# Patient Record
Sex: Female | Born: 1940 | Race: White | Hispanic: No | Smoking: Never smoker
Health system: Southern US, Community
[De-identification: ages and names within clinical notes are randomized; demographics above are authoritative.]

## PROBLEM LIST (undated history)

## (undated) DIAGNOSIS — I472 Ventricular tachycardia, unspecified: Secondary | ICD-10-CM

## (undated) DIAGNOSIS — I493 Ventricular premature depolarization: Secondary | ICD-10-CM

## (undated) DIAGNOSIS — T8859XA Other complications of anesthesia, initial encounter: Secondary | ICD-10-CM

## (undated) DIAGNOSIS — Z9889 Other specified postprocedural states: Secondary | ICD-10-CM

## (undated) DIAGNOSIS — I509 Heart failure, unspecified: Secondary | ICD-10-CM

## (undated) DIAGNOSIS — C439 Malignant melanoma of skin, unspecified: Secondary | ICD-10-CM

## (undated) DIAGNOSIS — T4145XA Adverse effect of unspecified anesthetic, initial encounter: Secondary | ICD-10-CM

## (undated) DIAGNOSIS — I10 Essential (primary) hypertension: Secondary | ICD-10-CM

## (undated) DIAGNOSIS — E079 Disorder of thyroid, unspecified: Secondary | ICD-10-CM

## (undated) DIAGNOSIS — I5023 Acute on chronic systolic (congestive) heart failure: Secondary | ICD-10-CM

## (undated) DIAGNOSIS — R112 Nausea with vomiting, unspecified: Secondary | ICD-10-CM

## (undated) DIAGNOSIS — I469 Cardiac arrest, cause unspecified: Secondary | ICD-10-CM

## (undated) DIAGNOSIS — Z87440 Personal history of urinary (tract) infections: Secondary | ICD-10-CM

## (undated) DIAGNOSIS — C9210 Chronic myeloid leukemia, BCR/ABL-positive, not having achieved remission: Secondary | ICD-10-CM

## (undated) HISTORY — PX: ABDOMINAL HYSTERECTOMY: SHX81

## (undated) HISTORY — PX: TONSILLECTOMY: SUR1361

## (undated) HISTORY — PX: TONSILLECTOMY: SHX5217

## (undated) HISTORY — DX: Heart failure, unspecified: I50.9

## (undated) HISTORY — DX: Essential (primary) hypertension: I10

## (undated) HISTORY — PX: TUBAL LIGATION: SHX77

## (undated) HISTORY — PX: APPENDECTOMY: SHX54

---

## 2004-07-08 ENCOUNTER — Encounter: Admission: RE | Admit: 2004-07-08 | Discharge: 2004-07-08 | Payer: Self-pay | Admitting: Internal Medicine

## 2004-08-20 ENCOUNTER — Observation Stay (HOSPITAL_COMMUNITY): Admission: AD | Admit: 2004-08-20 | Discharge: 2004-08-22 | Payer: Self-pay | Admitting: *Deleted

## 2004-08-20 ENCOUNTER — Encounter (INDEPENDENT_AMBULATORY_CARE_PROVIDER_SITE_OTHER): Payer: Self-pay | Admitting: Specialist

## 2005-09-03 ENCOUNTER — Inpatient Hospital Stay (HOSPITAL_COMMUNITY): Admission: EM | Admit: 2005-09-03 | Discharge: 2005-09-07 | Payer: Self-pay | Admitting: Surgery

## 2005-09-15 ENCOUNTER — Encounter: Admission: RE | Admit: 2005-09-15 | Discharge: 2005-10-09 | Payer: Self-pay

## 2006-10-14 ENCOUNTER — Encounter: Admission: RE | Admit: 2006-10-14 | Discharge: 2006-10-14 | Payer: Self-pay | Admitting: Family Medicine

## 2007-03-17 ENCOUNTER — Encounter: Admission: RE | Admit: 2007-03-17 | Discharge: 2007-03-17 | Payer: Self-pay | Admitting: Orthopaedic Surgery

## 2010-12-26 NOTE — Op Note (Signed)
NAMEAMARE, Lisa [name]            ACCOUNT NO.:  [id number]   MEDICAL RECORD NO.:  [id number]          PATIENT TYPE:  OBV   LOCATION:  0098                         FACILITY:  [facility]   PHYSICIAN:  Vikki Ports, MDDATE OF BIRTH:  06-28-1941   DATE OF PROCEDURE:  08/20/2004  DATE OF DISCHARGE:                                 OPERATIVE REPORT   PREOPERATIVE DIAGNOSIS:  Acute appendicitis.   POSTOPERATIVE DIAGNOSIS:  Acute appendicitis.   PROCEDURE:  Laparoscopic appendectomy.   SURGEON:  Vikki Ports, MD   ANESTHESIA:  General.   COMPLICATIONS:  None.   DRAINS:  None.   SPECIMENS:  Appendix.   DESCRIPTION OF PROCEDURE:  The patient was taken to the operating room and  placed in the supine position.  After adequate general anesthesia was  induced using endotracheal tube, the abdomen was prepped and draped in  normal sterile fashion.  Using a 12 mm Opti View in the left upper quadrant,  peritoneal penetration was obtained and pneumoperitoneum was obtained.  Under direct visualization, additional 12 mm port was placed in the left  lower quadrant and 5 mm port was placed in the left mid abdomen.  Appendix,  which was retrocecal in position, was mobilized.  The mesoappendix was taken  down with the harmonic scalpel, until the base of the appendix was  skeletonized.  I transected this across the cecum because of the thinness of  the base of the appendix.  The right lower quadrant was then copiously  irrigated.  The appendix was placed in an Endo catch bag and removed.  Pneumoperitoneum was released and skin was closed with subcuticular 4-0  Monocryl.  Injected with Marcaine.   The patient tolerated the procedure well and went to PACU in good condition.      KRH/MEDQ  D:  08/21/2004  T:  08/21/2004  Job:  [id number]

## 2010-12-26 NOTE — Discharge Summary (Signed)
NAMECAMBREY, LUPI NO.:  [id number]   MEDICAL RECORD NO.:  [id number]          PATIENT TYPE:  INP   LOCATION:  6703                         FACILITY:  [facility]   PHYSICIAN:  Lebron Conners, M.D.   DATE OF BIRTH:  [birth year]   DATE OF ADMISSION:  09/03/2005  DATE OF DISCHARGE:  09/07/2005                                 DISCHARGE SUMMARY   CHIEF COMPLAINT AND REASON FOR ADMISSION:  Lisa [name] is a 70 year old female  patient who developed cellulitis in her right lower extremity about eight  weeks prior to presentation.  On admission she was placed on Keflex and  apparently the leg improved.  She was instructed if her symptoms worsened to  present to the urgent care.  Twenty-four hours prior to admission she  developed pain in the right leg and then around 6 p.m. that same evening  developed chills and nausea and went to [facility] and was seen by  Dr. Cleta Alberts there.  Dr. Cleta Alberts contacted Dr. Ezzard Standing directly and requested that  Ms. Axe be admitted to the surgical service because of significant  cellulitis and swelling in the right lower extremity.   The patient was evaluated in the ER by Dr. Ezzard Standing.  She had low-grade  temperature of 99.3, pulse 117, BP 150/84.  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  The right leg demonstrated a superficial cellulitis, quite  erythematous laterally.  At this point the patient was deemed in need of  admission to the hospital for IV antibiotics for treatment of significant  right lower extremity cellulitis in the setting of chronic lower extremity  edema.   HOSPITAL COURSE:  The patient was admitted to the hospital by Dr. Ezzard Standing to  the surgical floor.  She was started on IV Tygacil.  Her initial white count  was 20,700, hemoglobin 11.2.  Because of the chronic lower extremity edema,  lower extremity Dopplers were ordered.  The patient told me during the  hospitalization that she has had lymphedema in her legs all her  life, and  the lymphedema has increased significantly over the past 10 years since she  has become somewhat obese.  Her pain was moderately controlled and due to  the degree of inflammatory symptoms, Toradol IV was added.   Over the following days the patient's cellulitis and edema improved.  The  Doppler study showed no DVT.  On September 07, 2005, the patient was switched  over to Keflex orally.  She is allergic to PENICILLIN and FLAGYL.  CBC was  checked and white count was down to 10,100.  The office was also notified of  need to set the patient up for any available lymphedema clinics.  This was  ordered per Dr. Orson Slick.  By September 07, 2005, she was markedly better.  She  was tolerating the Keflex and she was deemed appropriate for discharge home  by Dr. Orson Slick.   FINAL DISCHARGE DIAGNOSES:  1.  Right lower extremity cellulitis, resolved.  2.  Leukocytosis, resolved.  3.  Chronic bilateral lower extremity lymphedema.  4.  Normal lower extremity Doppler  study in regard to deep vein thrombosis.   DISCHARGE MEDICATIONS:  1.  Armor Thyroid 30 mg daily.  2.  Benicar/hydrochlorothiazide 20/12.5 mg daily.  3.  Keflex 250 mg q.i.d. for five days.   DIET:  Heart-healthy.   ACTIVITY:  Increase activity slowly.   WOUND CARE:  Elevated affected extremity, i.e., the right leg, when seated.   ADDITIONAL INSTRUCTIONS:  Referral to lymphedema clinic.  This will be done  through the [facility] office.  Follow up with Dr. Orson Slick if  needed.  She has the number to call.      Allison L. Rennis Harding, N.P.      Lebron Conners, M.D.  Electronically Signed    ALE/MEDQ  D:  10/28/2005  T:  10/29/2005  Job:  [id number]

## 2010-12-26 NOTE — H&P (Signed)
NAMEJANAYAH, ZAVADA            ACCOUNT NO.:  [id number]   MEDICAL RECORD NO.:  [id number]          PATIENT TYPE:  INP   LOCATION:  6703                         FACILITY:  [facility]   PHYSICIAN:  Sandria Bales. Ezzard Standing, M.D.  DATE OF BIRTH:  Mar 09, 1941   DATE OF ADMISSION:  09/03/2005  DATE OF DISCHARGE:                                HISTORY & PHYSICAL   HISTORY OF PRESENT ILLNESS:  This is a 70 year old white female who  identifies that Dr. Monico Hoar of [place] is her primary medical  physician. Apparently, she was a patient of Dr. Allena Napoleon up until  just eight or twelve weeks ago, but Dr. Alessandra Bevels does not take the insurance  that she has so she switched over to Dr. Monico Hoar.   Approximately eight weeks ago, she developed what she describes a cellulitis  in her leg. She was placed on Keflex. The leg got better; however, Dr.  Corine Shelter, her medical doctor, apparently told her not to go to the hospital.  He says if she got worse to go to Urgent Care.   Apparently some time in the last 24 hours she developed some pain in her  right leg. This evening at about 6 p.m. developed chills and nausea and went  to [facility] and was seen by Dr. Cleta Alberts.   I was operating in the operating room at the time but Dr. Cleta Alberts paged me and  had said that Dr. Kendrick Ranch had requested that we admit Ms. Earlene Plater', his  exwife, to the surgical service at [facility].   PAST MEDICAL HISTORY:  She has allergies to PENICILLIN which led to welts in  1982. She says she is allergic to DEMEROL. She is allergic to DARVON which  causes chest discomfort and has an allergy to FLAGYL.   CURRENT MEDICATIONS:  Amour thyroid extract and Benicar 20/12 1 tablet  daily.   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 She  had a tonsillectomy as a child. She had a hysterectomy in 1990 for a benign  disease and then in January of 2006 by Dr. Danna Hefty did a  laparoscopic appendectomy for acute appendicitis.   REVIEW OF SYSTEMS:  NEUROLOGICAL:  No history of seizure or loss of  consciousness.  PULMONARY:  No history of pneumonia or tuberculosis.  CARDIAC:  She has had hypertension for a number of years which sounds like  it has been stable. She has had no chest pain or angina.  GASTROINTESTINAL:  No history of peptic ulcer disease or liver disease. She  had a prior laparoscopic appendectomy.  MUSCULOSKELETAL:  She has chronic lymphedema of both lower extremities. She  says she has been that way for years. She has no real diagnosis. She does  have a defect of her right anterior on her right shin where she had a  melanoma excised in 1975.   Her daughter is in the room with her again. She is working part-time. I  think she has some insurance coverage and this is why she switched to Dr.  Corine Shelter.  PHYSICAL EXAMINATION:  VITAL SIGNS:  Temperature is 99.3, pulse 117,  respirations 20, blood pressure 150/84.  GENERAL:  She is a well-nourished obese white female, alert and cooperative  on physical exam.  HEENT:  Unremarkable.  NECK:  Supple. I felt no mass or thyromegaly.  LUNGS:  Clear to auscultation.  HEART:  Tachycardic. I hear no murmur.  ABDOMEN:  Soft. She has no tenderness, no guarding, no mass.  EXTREMITIES:  Both lower extremities has about 3+ edema which appears  chronic. Her right leg laterally along her calf is hot and red consistent  with a superficial cellulitis. She has a defect from a prior melanoma on the  medial aspect of her right anterior chin. I see no evidence of external  trauma.   I have no labs at this time. We will obtain some.   DIAGNOSES:  1.  Right leg superficial cellulitis in the face of chronic lower extremity      edema:  Plan is IV antibiotics, elevation of her leg and Doppler's of      her lower extremities in the morning. Hopefully, this can be controlled      in 24-48 hours so she can be sent home on  oral antibiotics.  2.  Hypertension.  3.  Thyroid replacement.  4.  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  5.  History of laparoscopic appendectomy for which she has done well.  6.  Obesity.      Sandria Bales. Ezzard Standing, M.D.  Electronically Signed     DHN/MEDQ  D:  09/03/2005  T:  09/04/2005  Job:  [id number]   cc:   Monico Hoar, M.D.  [employer] Cleta Alberts, M.D.  Fax: [phone]

## 2012-02-26 ENCOUNTER — Ambulatory Visit (INDEPENDENT_AMBULATORY_CARE_PROVIDER_SITE_OTHER): Payer: Medicare Other | Admitting: Family Medicine

## 2012-02-26 ENCOUNTER — Telehealth: Payer: Self-pay

## 2012-02-26 VITALS — BP 130/80 | HR 94 | Temp 98.0°F | Resp 16 | Ht 62.0 in | Wt 257.0 lb

## 2012-02-26 DIAGNOSIS — R062 Wheezing: Secondary | ICD-10-CM

## 2012-02-26 DIAGNOSIS — R05 Cough: Secondary | ICD-10-CM

## 2012-02-26 MED ORDER — ALBUTEROL SULFATE (2.5 MG/3ML) 0.083% IN NEBU
2.5000 mg | INHALATION_SOLUTION | Freq: Once | RESPIRATORY_TRACT | Status: AC
  Administered 2012-02-26: 2.5 mg via RESPIRATORY_TRACT

## 2012-02-26 MED ORDER — IPRATROPIUM BROMIDE 0.03 % NA SOLN: 2.0000 | Freq: Four times a day (QID) | NASAL | Status: DC

## 2012-02-26 MED ORDER — ALBUTEROL SULFATE HFA 108 (90 BASE) MCG/ACT IN AERS: 2.0000 | INHALATION_SPRAY | Freq: Four times a day (QID) | RESPIRATORY_TRACT | Status: DC | PRN

## 2012-02-26 MED ORDER — AZITHROMYCIN 250 MG PO TABS: ORAL_TABLET | ORAL | Status: AC

## 2012-02-26 NOTE — Progress Notes (Signed)
[facility] [address] [place] [address] [phone]  Date:  02/26/2012   Name:  Lisa [name]   DOB:  21-May-1941   MRN:  [id number]  PCP:  No primary provider on file.    Chief Complaint: Cough   History of Present Illness:  Sulamita Lafountain is a 71 y.o. very pleasant female patient who presents with the following:  She has noted a cough for the past 6 weeks.  Seemed to start with ?allergies and clear PND.  She had been coughing up clear mucus.  Her symptoms have actually gotten better. However, she was concerned about the duration of her symtoms and she sometimes feels SOB when she has a coughing attack.  She is worse with lying down or sleeping and has even noted some wheezing at night.  She does not typically have wheezing.     She has not noted any fever.  She does not have asthma or RAD. She has tried an OTC cough syrup, throat drops.    She takes a thyroid replacement medication and vitamins.   She has never been a smoker  There is no problem list on file for this patient.   No past medical history on file.  No past surgical history on file.  History  Substance Use Topics  . Smoking status: Never Smoker   . Smokeless tobacco: Not on file  . Alcohol Use: Not on file    No family history on file.  Allergies  Allergen Reactions  . Flagyl (Metronidazole)   . Penicillins     Medication list has been reviewed and updated.  No current outpatient prescriptions on file prior to visit.    Review of Systems:  As per HPI- otherwise negative.  Physical Examination: Filed Vitals:   02/26/12 1155  BP: 130/80  Pulse: 94  Temp: 98 F (36.7 C)  Resp: 16   Filed Vitals:   02/26/12 1155  Height: 5\' 2"  (1.575 m)  Weight: 257 lb (116.574 kg)   Body mass index is 47.01 kg/(m^2). Ideal Body Weight: Weight in (lb) to have BMI = 25: 136.4   GEN: WDWN, NAD, Non-toxic, A & O x 3, obese HEENT: Atraumatic, Normocephalic. Neck supple. No  masses, No LAD. Ears and Nose: No external deformity. CV: RRR, No M/G/R. No JVD. No thrill. No extra heart sounds. PULM: CTA B, no crackles, rhonchi. No retractions. No resp. distress. No accessory muscle use. Slightly decreased air movement bilaterally, but no wheeze ABD: S, NT, ND, +BS. No rebound. No HSM. EXTR: No c/c/e NEURO Normal gait.  PSYCH: Normally interactive. Conversant. Not depressed or anxious appearing.  Calm demeanor.   Treated with albuterol neg treatment- lungs more clear, better air flow, O2 sat improved to 97%  Assessment and Plan: 1. Cough  azithromycin (ZITHROMAX) 250 MG tablet, ipratropium (ATROVENT) 0.03 % nasal spray, albuterol (PROVENTIL HFA;VENTOLIN HFA) 108 (90 BASE) MCG/ACT inhaler, albuterol (PROVENTIL) (2.5 MG/3ML) 0.083% nebulizer solution 2.5 mg  2. Wheezing     Treat for bronchitis and PND/ RAD as above.   If not better in the next few days please give [place] a call- Sooner if worse.     Abbe Amsterdam, MD

## 2012-02-26 NOTE — Telephone Encounter (Signed)
The patient called to report that [facility] did not have the nasal spray in stock and would like [place] to send the Rx for that medication to the [facility] on [address].  Please call the patient once this has been done at [phone].

## 2012-02-27 NOTE — Telephone Encounter (Signed)
Spoke with patient, and she had already gotten medication transferred.

## 2015-02-14 ENCOUNTER — Other Ambulatory Visit: Payer: Self-pay | Admitting: Family Medicine

## 2015-02-14 ENCOUNTER — Ambulatory Visit (INDEPENDENT_AMBULATORY_CARE_PROVIDER_SITE_OTHER): Payer: Medicare Other | Admitting: Family Medicine

## 2015-02-14 ENCOUNTER — Ambulatory Visit (INDEPENDENT_AMBULATORY_CARE_PROVIDER_SITE_OTHER): Payer: Medicare Other

## 2015-02-14 VITALS — BP 132/82 | HR 97 | Temp 101.4°F | Resp 17 | Ht 62.5 in | Wt 254.0 lb

## 2015-02-14 DIAGNOSIS — I890 Lymphedema, not elsewhere classified: Secondary | ICD-10-CM

## 2015-02-14 DIAGNOSIS — R1084 Generalized abdominal pain: Secondary | ICD-10-CM

## 2015-02-14 DIAGNOSIS — R42 Dizziness and giddiness: Secondary | ICD-10-CM | POA: Diagnosis not present

## 2015-02-14 DIAGNOSIS — R112 Nausea with vomiting, unspecified: Secondary | ICD-10-CM

## 2015-02-14 DIAGNOSIS — E038 Other specified hypothyroidism: Secondary | ICD-10-CM | POA: Diagnosis not present

## 2015-02-14 DIAGNOSIS — R509 Fever, unspecified: Secondary | ICD-10-CM | POA: Diagnosis not present

## 2015-02-14 DIAGNOSIS — E860 Dehydration: Secondary | ICD-10-CM

## 2015-02-14 DIAGNOSIS — R109 Unspecified abdominal pain: Secondary | ICD-10-CM

## 2015-02-14 DIAGNOSIS — R197 Diarrhea, unspecified: Secondary | ICD-10-CM | POA: Diagnosis not present

## 2015-02-14 DIAGNOSIS — J189 Pneumonia, unspecified organism: Secondary | ICD-10-CM

## 2015-02-14 DIAGNOSIS — Z8679 Personal history of other diseases of the circulatory system: Secondary | ICD-10-CM

## 2015-02-14 LAB — COMPLETE METABOLIC PANEL WITH GFR
ALT: 14 U/L (ref 0–35)
AST: 17 U/L (ref 0–37)
Albumin: 4.2 g/dL (ref 3.5–5.2)
Alkaline Phosphatase: 109 U/L (ref 39–117)
BUN: 16 mg/dL (ref 6–23)
CO2: 20 mEq/L (ref 19–32)
Calcium: 9.3 mg/dL (ref 8.4–10.5)
Chloride: 99 mEq/L (ref 96–112)
Creat: 1.17 mg/dL — ABNORMAL HIGH (ref 0.50–1.10)
GFR, Est African American: 53 mL/min — ABNORMAL LOW
GFR, Est Non African American: 46 mL/min — ABNORMAL LOW
Glucose, Bld: 130 mg/dL — ABNORMAL HIGH (ref 70–99)
Potassium: 4.8 mEq/L (ref 3.5–5.3)
Sodium: 133 mEq/L — ABNORMAL LOW (ref 135–145)
Total Bilirubin: 1 mg/dL (ref 0.2–1.2)
Total Protein: 7.4 g/dL (ref 6.0–8.3)

## 2015-02-14 LAB — POCT URINALYSIS DIPSTICK
Bilirubin, UA: NEGATIVE
Glucose, UA: NEGATIVE
Ketones, UA: NEGATIVE
Leukocytes, UA: NEGATIVE
Nitrite, UA: NEGATIVE
Protein, UA: NEGATIVE
Spec Grav, UA: 1.02
Urobilinogen, UA: 0.2
pH, UA: 5

## 2015-02-14 LAB — CBC

## 2015-02-14 LAB — POCT UA - MICROSCOPIC ONLY
Casts, Ur, LPF, POC: NEGATIVE
Crystals, Ur, HPF, POC: NEGATIVE
Mucus, UA: NEGATIVE
Yeast, UA: NEGATIVE

## 2015-02-14 LAB — TSH: TSH: 0.257 u[IU]/mL — ABNORMAL LOW (ref 0.350–4.500)

## 2015-02-14 MED ORDER — AZITHROMYCIN 250 MG PO TABS: ORAL_TABLET | ORAL | Status: DC

## 2015-02-14 MED ORDER — ACETAMINOPHEN 325 MG PO TABS
500.0000 mg | ORAL_TABLET | Freq: Once | ORAL | Status: AC
  Administered 2015-02-14: 487.5 mg via ORAL

## 2015-02-14 MED ORDER — ONDANSETRON 4 MG PO TBDP: 4.0000 mg | ORAL_TABLET | Freq: Three times a day (TID) | ORAL | Status: DC | PRN

## 2015-02-14 MED ORDER — ONDANSETRON 4 MG PO TBDP
4.0000 mg | ORAL_TABLET | Freq: Once | ORAL | Status: AC
  Administered 2015-02-14: 4 mg via ORAL

## 2015-02-14 NOTE — Patient Instructions (Signed)

## 2015-02-14 NOTE — Progress Notes (Signed)
Chief Complaint:  Chief Complaint  Patient presents with  . Nausea  . Abdominal Pain  . Dizziness    HPI: Lisa [name] is a 74 y.o. female who is here for  Chills, nonbloody diarrhea, soft stools, dizziness, she has nausea and vomiting and dry heaves. She is not having flatus.  She denies any abdominal surgeires, She has had a hysterectomy, she has had  appendectomy, she ahs had a tubal ligation. She still has her gallbaldder.  She deneis SBO but has had in the past some constipation.  No recent travels or abx , she ha shad no appetite, she is so thirsty.. She has had distension of her stomach.  She has had some SOB but no CP, she has a hx of SOB due to CHF.  Her shortness of breath has been unchanged. She denies having any weight gain. She is very dizzy.  It doesn't matter she's moving or sitting. She has not had any by mouth intake. She has hadnonbloody  diarrhea. She has a history of CHF with an EF of 50. She has cellulitis and lymphedema  And one of her legs may be warmer thant he other but not sure.  She has a history of diverticular disease  Wt Readings from Last 3 Encounters:  02/14/15 254 lb (115.214 kg)  02/26/12 257 lb (116.574 kg)    SpO2 Readings from Last 3 Encounters:  02/14/15 97%  02/26/12 97%    Her PCP -Wolfgang Phoenix Vu in [place]  No past medical history on file. No past surgical history on file. History   Social History  . Marital Status: Divorced    Spouse Name: N/A  . Number of Children: N/A  . Years of Education: N/A   Social History Main Topics  . Smoking status: Never Smoker   . Smokeless tobacco: Not on file  . Alcohol Use: Not on file  . Drug Use: Not on file  . Sexual Activity: Not on file   Other Topics Concern  . None   Social History Narrative   No family history on file. Allergies  Allergen Reactions  . Flagyl [Metronidazole]   . Penicillins    Prior to Admission medications   Medication Sig Start Date End Date Taking?  Authorizing Provider  aspirin EC 81 MG tablet Take 81 mg by mouth daily.   Yes Historical Provider, MD  atorvastatin (LIPITOR) 20 MG tablet Take 20 mg by mouth daily.   Yes Historical Provider, MD  carvedilol (COREG) 12.5 MG tablet Take 12.5 mg by mouth 2 (two) times daily with a meal.   Yes Historical Provider, MD  co-enzyme Q-10 30 MG capsule Take 30 mg by mouth 3 (three) times daily.   Yes Historical Provider, MD  furosemide (LASIX) 20 MG tablet Take 20 mg by mouth.   Yes Historical Provider, MD  lisinopril (PRINIVIL,ZESTRIL) 5 MG tablet Take 5 mg by mouth daily.   Yes Historical Provider, MD  thyroid (ARMOUR) 15 MG tablet Take 15 mg by mouth daily.   Yes Historical Provider, MD     ROS: The patient denies  night sweats, unintentional weight loss, chest pain, palpitations, wheezing, dysuria, hematuria, melena, numbness,  or tingling.   All other systems have been reviewed and were otherwise negative with the exception of those mentioned in the HPI and as above.    PHYSICAL EXAM: Filed Vitals:   02/14/15 1241  BP: 132/82  Pulse: 97  Temp: 101.4 F (38.6 C)  Resp: 17   Filed Vitals:   02/14/15 1241  Height: 5' 2.5" (1.588 m)  Weight: 254 lb (115.214 kg)   Body mass index is 45.69 kg/(m^2).   General: Alert, no acute distress, obese, tired appearing HEENT:  Normocephalic, atraumatic, oropharynx patent. EOMI, PERRLA Cardiovascular:  Regular rate and rhythm, no rubs murmurs or gallops.  No Carotid bruits, radial pulse intact. Positive lymphedema  Respiratory: Clear to auscultation bilaterally.  No wheezes, rales, or rhonchi.  No cyanosis, no use of accessory musculature GI: No organomegaly, abdomen is soft and non-tender, positive bowel sounds.  No masses. Skin: No rashes. Neurologic: Facial musculature symmetric. Psychiatric: Patient is appropriate throughout our interaction. Lymphatic: No cervical lymphadenopathy Musculoskeletal: Gait intact.   LABS: Results for orders  placed or performed in visit on 02/14/15  POCT UA - Microscopic Only  Result Value Ref Range   WBC, Ur, HPF, POC 1-3    RBC, urine, microscopic 0-2    Bacteria, U Microscopic few    Mucus, UA neg    Epithelial cells, urine per micros 2-4    Crystals, Ur, HPF, POC neg    Casts, Ur, LPF, POC neg    Yeast, UA neg   POCT urinalysis dipstick  Result Value Ref Range   Color, UA yellow    Clarity, UA clear    Glucose, UA neg    Bilirubin, UA neg    Ketones, UA neg    Spec Grav, UA 1.020    Blood, UA trace-lysed    pH, UA 5.0    Protein, UA neg    Urobilinogen, UA 0.2    Nitrite, UA neg    Leukocytes, UA Negative Negative     EKG/XRAY:   Primary read interpreted by [facility]. Marin Comment at [facility]. ? Right sided PNA/infiltrate Moderate stool, no obvious obstruction or free air   ASSESSMENT/PLAN: Encounter Diagnoses  Name Primary?  . Diffuse abdominal pain Yes  . Dizziness and giddiness   . Dehydration   . Lymphedema   . Diarrhea   . Non-intractable vomiting with nausea, vomiting of unspecified type   . Other specified fever   . CAP (community acquired pneumonia)    74 year old female with a PMH of Hypothyroid, CHF with EF of 50% , and chronic lymphedema who present for fatigue , dehydration, nonbloody diarrhea and chest xray did not show fluid overload but di have possible infiltrate.  She has been taking all her meds and has not been hydrating. She felt better after IVFs and zofran  Azithromycin, MiraLAX for constipation and when able collect stool softener she is to stay on unti plan iqid diet if shefors sh nees to retun for follow-up tomorrw Discussed options of IM Rocephin but she has a penicillin allergy, however she has taken Keflex before without any problems but we will stay with the plan of putting her on zpack, if sxs worsen may need to take rocephin There is a potential that may be her left leg is having early signs of cellulitis but it's very similar to her right leg. She has  chronic lymphedema. Sxs may be diverticular disease Tylenol and/or motrin prn for fever Fu in 1-2 days  Gross sideeffects, risk and benefits, and alternatives of medications d/w patient. Patient is aware that all medications have potential sideeffects and we are unable to predict every sideeffect or drug-drug interaction that may occur.  Dr. Tilden Fossa Marin Comment 02/14/2015 4:03 PM  02/15/15-called patient. Dw her labs, she is feelingbetter. Needs to  stay off thyroid meds for 1 week and recheck with Dr Gale Journey since overcorrected.  Lab Results  Component Value Date   TSH 0.257* 02/14/2015   IMPRESSION: Streaky right basilar atelectasis or infiltrate. Osteopenia and mild degenerative changes thoracic spine.   Electronically Signed  By: Lahoma Crocker M.D.  On: 02/14/2015 14:10

## 2015-02-17 ENCOUNTER — Ambulatory Visit (INDEPENDENT_AMBULATORY_CARE_PROVIDER_SITE_OTHER): Payer: Medicare Other | Admitting: Emergency Medicine

## 2015-02-17 VITALS — BP 140/82 | HR 90 | Temp 98.4°F | Resp 17 | Wt 260.0 lb

## 2015-02-17 DIAGNOSIS — E039 Hypothyroidism, unspecified: Secondary | ICD-10-CM | POA: Diagnosis not present

## 2015-02-17 DIAGNOSIS — J189 Pneumonia, unspecified organism: Secondary | ICD-10-CM | POA: Diagnosis not present

## 2015-02-17 DIAGNOSIS — R109 Unspecified abdominal pain: Secondary | ICD-10-CM | POA: Diagnosis not present

## 2015-02-17 DIAGNOSIS — R1084 Generalized abdominal pain: Secondary | ICD-10-CM

## 2015-02-17 NOTE — Progress Notes (Signed)
Subjective:  Patient ID: Lisa [name], female    DOB: 05-Jul-1941  Age: 74 y.o. MRN: [id number]  CC: Follow-up; Abdominal Pain; and fever blisters   HPI Lisa [name] presents  with a history of a visit for pneumonia and abdominal pain secondary to constipation. She's used her laxative is ordered and has been drinking liquids. Her abdominal pain and distention is gone. She has a persistent cough but is much less than was before and is no longer productive. She's no fever chills nausea vomiting. She has no shortness breath or wheezing. She has no fever or chills.  History Lisa [name] has a past medical history of Hypertension and CHF (congestive heart failure).   She has past surgical history that includes Tubal ligation; Abdominal hysterectomy; Appendectomy; and Tonsillectomy.   Her  family history is not on file.  She   reports that she has never smoked. She does not have any smokeless tobacco history on file. Her alcohol and drug histories are not on file.  Outpatient Prescriptions Prior to Visit  Medication Sig Dispense Refill  . aspirin EC 81 MG tablet Take 81 mg by mouth daily.    [name] atorvastatin (LIPITOR) 20 MG tablet Take 20 mg by mouth daily.    [name] azithromycin (ZITHROMAX) 250 MG tablet Take 2 tabs po now then 1 tab po daily for the next 4 days 6 each 0  . carvedilol (COREG) 12.5 MG tablet Take 12.5 mg by mouth 2 (two) times daily with a meal.    . co-enzyme Q-10 30 MG capsule Take 30 mg by mouth 3 (three) times daily.    [name] lisinopril (PRINIVIL,ZESTRIL) 5 MG tablet Take 5 mg by mouth daily.    . furosemide (LASIX) 20 MG tablet Take 20 mg by mouth.    . ondansetron (ZOFRAN ODT) 4 MG disintegrating tablet Take 1 tablet (4 mg total) by mouth every 8 (eight) hours as needed for nausea or vomiting. (Patient not taking: Reported on 02/17/2015) 20 tablet 0  . thyroid (ARMOUR) 15 MG tablet Take 15 mg by mouth daily.     No facility-administered medications prior to visit.     History   Social History  . Marital Status: Divorced    Spouse Name: N/A  . Number of Children: N/A  . Years of Education: N/A   Social History Main Topics  . Smoking status: Never Smoker   . Smokeless tobacco: Not on file  . Alcohol Use: Not on file  . Drug Use: Not on file  . Sexual Activity: Not on file   Other Topics Concern  . None   Social History Narrative     Review of Systems  Constitutional: Negative for fever, chills and appetite change.  HENT: Negative for congestion, ear pain, postnasal drip, sinus pressure and sore throat.   Eyes: Negative for pain and redness.  Respiratory: Negative for cough, shortness of breath and wheezing.   Cardiovascular: Negative for leg swelling.  Gastrointestinal: Negative for nausea, vomiting, abdominal pain, diarrhea, constipation and blood in stool.  Endocrine: Negative for polyuria.  Genitourinary: Negative for dysuria, urgency, frequency and flank pain.  Musculoskeletal: Negative for gait problem.  Skin: Negative for rash.  Neurological: Negative for weakness and headaches.  Psychiatric/Behavioral: Negative for confusion and decreased concentration. The patient is not nervous/anxious.     Objective:  BP 140/82 mmHg  Pulse 90  Temp(Src) 98.4 F (36.9 C) (Oral)  Resp 17  Wt 260 lb (117.935 kg)  SpO2 95%  LMP 02/14/1995  Physical Exam  Constitutional: She is oriented to person, place, and time. She appears well-developed and well-nourished.  HENT:  Head: Normocephalic and atraumatic.  Eyes: Conjunctivae are normal. Pupils are equal, round, and reactive to light.  Pulmonary/Chest: Effort normal.  Musculoskeletal: She exhibits no edema.  Neurological: She is alert and oriented to person, place, and time.  Skin: Skin is dry.  Psychiatric: She has a normal mood and affect. Her behavior is normal. Thought content normal.      Assessment & Plan:   Lisa [name] was seen today for follow-up, abdominal pain and fever  blisters.  Diagnoses and all orders for this visit:  CAP (community acquired pneumonia)  Diffuse abdominal pain  Hypothyroidism, unspecified hypothyroidism type   I am having [name] maintain her carvedilol, aspirin EC, co-enzyme Q-10, lisinopril, furosemide, thyroid, atorvastatin, azithromycin, and ondansetron.  No orders of the defined types were placed in this encounter.    Appropriate red flag conditions were discussed with the patient as well as actions that should be taken.  Patient expressed his understanding.  Follow-up: Return in about 1 month (around 03/20/2015).  Roselee Culver, MD

## 2015-02-17 NOTE — Patient Instructions (Signed)
Take 30 one day and 45 next.  Follow up for repeat TSH in one month

## 2015-02-21 DIAGNOSIS — E038 Other specified hypothyroidism: Secondary | ICD-10-CM | POA: Insufficient documentation

## 2015-02-21 DIAGNOSIS — I890 Lymphedema, not elsewhere classified: Secondary | ICD-10-CM | POA: Insufficient documentation

## 2015-02-21 DIAGNOSIS — E039 Hypothyroidism, unspecified: Secondary | ICD-10-CM | POA: Insufficient documentation

## 2015-08-11 DIAGNOSIS — I2510 Atherosclerotic heart disease of native coronary artery without angina pectoris: Secondary | ICD-10-CM

## 2015-08-11 HISTORY — DX: Atherosclerotic heart disease of native coronary artery without angina pectoris: I25.10

## 2015-09-09 ENCOUNTER — Inpatient Hospital Stay (HOSPITAL_COMMUNITY)
Admission: EM | Admit: 2015-09-09 | Discharge: 2015-09-11 | DRG: 872 | Disposition: A | Discharge status: Home / Self Care | Payer: Medicare Other | Attending: Internal Medicine | Admitting: Internal Medicine

## 2015-09-09 ENCOUNTER — Encounter (HOSPITAL_COMMUNITY): Payer: Self-pay | Admitting: Emergency Medicine

## 2015-09-09 ENCOUNTER — Emergency Department (HOSPITAL_COMMUNITY): Payer: Medicare Other

## 2015-09-09 DIAGNOSIS — Z880 Allergy status to penicillin: Secondary | ICD-10-CM

## 2015-09-09 DIAGNOSIS — L0390 Cellulitis, unspecified: Secondary | ICD-10-CM | POA: Diagnosis present

## 2015-09-09 DIAGNOSIS — A419 Sepsis, unspecified organism: Principal | ICD-10-CM | POA: Diagnosis present

## 2015-09-09 DIAGNOSIS — N183 Chronic kidney disease, stage 3 (moderate): Secondary | ICD-10-CM | POA: Diagnosis present

## 2015-09-09 DIAGNOSIS — Z881 Allergy status to other antibiotic agents status: Secondary | ICD-10-CM

## 2015-09-09 DIAGNOSIS — Z85820 Personal history of malignant melanoma of skin: Secondary | ICD-10-CM

## 2015-09-09 DIAGNOSIS — Z8249 Family history of ischemic heart disease and other diseases of the circulatory system: Secondary | ICD-10-CM

## 2015-09-09 DIAGNOSIS — Z6841 Body Mass Index (BMI) 40.0 and over, adult: Secondary | ICD-10-CM

## 2015-09-09 DIAGNOSIS — Z7982 Long term (current) use of aspirin: Secondary | ICD-10-CM

## 2015-09-09 DIAGNOSIS — E038 Other specified hypothyroidism: Secondary | ICD-10-CM | POA: Diagnosis not present

## 2015-09-09 DIAGNOSIS — I428 Other cardiomyopathies: Secondary | ICD-10-CM | POA: Diagnosis present

## 2015-09-09 DIAGNOSIS — R509 Fever, unspecified: Secondary | ICD-10-CM | POA: Diagnosis not present

## 2015-09-09 DIAGNOSIS — I447 Left bundle-branch block, unspecified: Secondary | ICD-10-CM | POA: Diagnosis present

## 2015-09-09 DIAGNOSIS — I130 Hypertensive heart and chronic kidney disease with heart failure and stage 1 through stage 4 chronic kidney disease, or unspecified chronic kidney disease: Secondary | ICD-10-CM | POA: Diagnosis present

## 2015-09-09 DIAGNOSIS — L03115 Cellulitis of right lower limb: Secondary | ICD-10-CM | POA: Diagnosis present

## 2015-09-09 DIAGNOSIS — I890 Lymphedema, not elsewhere classified: Secondary | ICD-10-CM | POA: Diagnosis present

## 2015-09-09 DIAGNOSIS — E039 Hypothyroidism, unspecified: Secondary | ICD-10-CM | POA: Diagnosis present

## 2015-09-09 DIAGNOSIS — I5042 Chronic combined systolic (congestive) and diastolic (congestive) heart failure: Secondary | ICD-10-CM | POA: Diagnosis present

## 2015-09-09 DIAGNOSIS — Z8489 Family history of other specified conditions: Secondary | ICD-10-CM

## 2015-09-09 DIAGNOSIS — M7989 Other specified soft tissue disorders: Secondary | ICD-10-CM | POA: Diagnosis present

## 2015-09-09 DIAGNOSIS — I5032 Chronic diastolic (congestive) heart failure: Secondary | ICD-10-CM | POA: Diagnosis not present

## 2015-09-09 DIAGNOSIS — Z807 Family history of other malignant neoplasms of lymphoid, hematopoietic and related tissues: Secondary | ICD-10-CM

## 2015-09-09 DIAGNOSIS — I878 Other specified disorders of veins: Secondary | ICD-10-CM | POA: Diagnosis present

## 2015-09-09 DIAGNOSIS — R112 Nausea with vomiting, unspecified: Secondary | ICD-10-CM | POA: Diagnosis not present

## 2015-09-09 DIAGNOSIS — I110 Hypertensive heart disease with heart failure: Secondary | ICD-10-CM | POA: Diagnosis present

## 2015-09-09 DIAGNOSIS — I10 Essential (primary) hypertension: Secondary | ICD-10-CM | POA: Diagnosis present

## 2015-09-09 DIAGNOSIS — Z79899 Other long term (current) drug therapy: Secondary | ICD-10-CM

## 2015-09-09 DIAGNOSIS — R0989 Other specified symptoms and signs involving the circulatory and respiratory systems: Secondary | ICD-10-CM | POA: Diagnosis present

## 2015-09-09 HISTORY — DX: Malignant melanoma of skin, unspecified: C43.9

## 2015-09-09 HISTORY — DX: Disorder of thyroid, unspecified: E07.9

## 2015-09-09 LAB — COMPREHENSIVE METABOLIC PANEL
ALT: 16 U/L (ref 14–54)
AST: 20 U/L (ref 15–41)
Albumin: 4 g/dL (ref 3.5–5.0)
Alkaline Phosphatase: 81 U/L (ref 38–126)
Anion gap: 10 (ref 5–15)
BUN: 15 mg/dL (ref 6–20)
CO2: 23 mmol/L (ref 22–32)
Calcium: 9.2 mg/dL (ref 8.9–10.3)
Chloride: 105 mmol/L (ref 101–111)
Creatinine, Ser: 1.01 mg/dL — ABNORMAL HIGH (ref 0.44–1.00)
GFR calc Af Amer: >60 mL/min (ref >60)
GFR calc non Af Amer: 54 mL/min — ABNORMAL LOW (ref >60)
Glucose, Bld: 145 mg/dL — ABNORMAL HIGH (ref 65–99)
Potassium: 4.4 mmol/L (ref 3.5–5.1)
Sodium: 138 mmol/L (ref 135–145)
Total Bilirubin: 1.2 mg/dL (ref 0.3–1.2)
Total Protein: 7.6 g/dL (ref 6.5–8.1)

## 2015-09-09 LAB — CBC
HCT: 41.7 % (ref 36.0–46.0)
Hemoglobin: 13.2 g/dL (ref 12.0–15.0)
MCH: 27.8 pg (ref 26.0–34.0)
MCHC: 31.7 g/dL (ref 30.0–36.0)
MCV: 87.8 fL (ref 78.0–100.0)
Platelets: 236 10*3/uL (ref 150–400)
RBC: 4.75 MIL/uL (ref 3.87–5.11)
RDW: 14.6 % (ref 11.5–15.5)
WBC: 28.6 10*3/uL — ABNORMAL HIGH (ref 4.0–10.5)

## 2015-09-09 LAB — URINE MICROSCOPIC-ADD ON

## 2015-09-09 LAB — URINALYSIS, ROUTINE W REFLEX MICROSCOPIC
Bilirubin Urine: NEGATIVE
Glucose, UA: NEGATIVE mg/dL
Ketones, ur: NEGATIVE mg/dL
Leukocytes, UA: NEGATIVE
Nitrite: NEGATIVE
Protein, ur: NEGATIVE mg/dL
Specific Gravity, Urine: 1.022 (ref 1.005–1.030)
pH: 5.5 (ref 5.0–8.0)

## 2015-09-09 LAB — LIPASE, BLOOD: Lipase: 23 U/L (ref 11–51)

## 2015-09-09 LAB — I-STAT CG4 LACTIC ACID, ED: Lactic Acid, Venous: 1.45 mmol/L (ref 0.5–2.0)

## 2015-09-09 MED ORDER — CEFAZOLIN SODIUM-DEXTROSE 2-3 GM-% IV SOLR
2.0000 g | Freq: Three times a day (TID) | INTRAVENOUS | Status: DC
  Administered 2015-09-09 – 2015-09-11 (×5): 2 g via INTRAVENOUS
  Filled 2015-09-09 (×6): qty 50

## 2015-09-09 MED ORDER — ATORVASTATIN CALCIUM 20 MG PO TABS
20.0000 mg | ORAL_TABLET | Freq: Every day | ORAL | Status: DC
  Administered 2015-09-10: 20 mg via ORAL
  Filled 2015-09-09 (×2): qty 1

## 2015-09-09 MED ORDER — FAMOTIDINE 20 MG PO TABS
20.0000 mg | ORAL_TABLET | Freq: Every day | ORAL | Status: DC | PRN
  Filled 2015-09-09: qty 1

## 2015-09-09 MED ORDER — CARVEDILOL 12.5 MG PO TABS
12.5000 mg | ORAL_TABLET | Freq: Two times a day (BID) | ORAL | Status: DC
  Administered 2015-09-10 – 2015-09-11 (×3): 12.5 mg via ORAL
  Filled 2015-09-09 (×3): qty 1

## 2015-09-09 MED ORDER — OSELTAMIVIR PHOSPHATE 75 MG PO CAPS
75.0000 mg | ORAL_CAPSULE | Freq: Two times a day (BID) | ORAL | Status: DC
  Filled 2015-09-09 (×4): qty 1

## 2015-09-09 MED ORDER — ENOXAPARIN SODIUM 60 MG/0.6ML ~~LOC~~ SOLN
60.0000 mg | Freq: Every day | SUBCUTANEOUS | Status: DC
  Filled 2015-09-09 (×2): qty 0.6

## 2015-09-09 MED ORDER — SODIUM CHLORIDE 0.9 % IV BOLUS (SEPSIS)
500.0000 mL | Freq: Once | INTRAVENOUS | Status: AC
  Administered 2015-09-09: 500 mL via INTRAVENOUS

## 2015-09-09 MED ORDER — ACETAMINOPHEN 325 MG PO TABS
650.0000 mg | ORAL_TABLET | Freq: Once | ORAL | Status: AC
  Administered 2015-09-09: 650 mg via ORAL
  Filled 2015-09-09: qty 2

## 2015-09-09 MED ORDER — LISINOPRIL 5 MG PO TABS
5.0000 mg | ORAL_TABLET | Freq: Every day | ORAL | Status: DC
  Administered 2015-09-10 – 2015-09-11 (×2): 5 mg via ORAL
  Filled 2015-09-09 (×2): qty 1

## 2015-09-09 MED ORDER — ACETAMINOPHEN 500 MG PO TABS
500.0000 mg | ORAL_TABLET | Freq: Four times a day (QID) | ORAL | Status: DC | PRN
  Administered 2015-09-10: 500 mg via ORAL
  Filled 2015-09-09: qty 1

## 2015-09-09 MED ORDER — ASPIRIN EC 81 MG PO TBEC
81.0000 mg | DELAYED_RELEASE_TABLET | Freq: Every day | ORAL | Status: DC
  Administered 2015-09-10: 81 mg via ORAL
  Filled 2015-09-09 (×2): qty 1

## 2015-09-09 MED ORDER — ACETAMINOPHEN 650 MG RE SUPP: 650.0000 mg | Freq: Four times a day (QID) | RECTAL | Status: DC | PRN

## 2015-09-09 MED ORDER — SODIUM CHLORIDE 0.9% FLUSH
3.0000 mL | Freq: Two times a day (BID) | INTRAVENOUS | Status: DC
  Administered 2015-09-09 – 2015-09-11 (×3): 3 mL via INTRAVENOUS

## 2015-09-09 MED ORDER — ONDANSETRON HCL 4 MG/2ML IJ SOLN
4.0000 mg | Freq: Once | INTRAMUSCULAR | Status: AC
  Administered 2015-09-09: 4 mg via INTRAVENOUS
  Filled 2015-09-09: qty 2

## 2015-09-09 MED ORDER — THYROID 30 MG PO TABS
30.0000 mg | ORAL_TABLET | Freq: Every day | ORAL | Status: DC
  Administered 2015-09-10 – 2015-09-11 (×2): 30 mg via ORAL
  Filled 2015-09-09 (×2): qty 1

## 2015-09-09 MED ORDER — ONDANSETRON HCL 4 MG/2ML IJ SOLN: 4.0000 mg | Freq: Four times a day (QID) | INTRAMUSCULAR | Status: DC | PRN

## 2015-09-09 MED ORDER — ACETAMINOPHEN 325 MG PO TABS: 650.0000 mg | ORAL_TABLET | Freq: Four times a day (QID) | ORAL | Status: DC | PRN

## 2015-09-09 MED ORDER — ENOXAPARIN SODIUM 40 MG/0.4ML ~~LOC~~ SOLN: 40.0000 mg | SUBCUTANEOUS | Status: DC

## 2015-09-09 MED ORDER — ONDANSETRON HCL 4 MG PO TABS: 4.0000 mg | ORAL_TABLET | Freq: Four times a day (QID) | ORAL | Status: DC | PRN

## 2015-09-09 MED ORDER — VANCOMYCIN HCL 10 G IV SOLR
2000.0000 mg | INTRAVENOUS | Status: AC
  Administered 2015-09-09: 2000 mg via INTRAVENOUS
  Filled 2015-09-09: qty 2000

## 2015-09-09 MED ORDER — VANCOMYCIN HCL 10 G IV SOLR: 1250.0000 mg | INTRAVENOUS | Status: DC

## 2015-09-09 NOTE — ED Provider Notes (Signed)
CSN: [id number]     Arrival date & time 09/09/15  1326 History   First MD Initiated Contact with Patient 09/09/15 1757     Chief Complaint  Patient presents with  . Dizziness  . Emesis     (Consider location/radiation/quality/duration/timing/severity/associated sxs/prior Treatment) HPI   74yF with abdominal pain and n/v since this morning. Pain is across lower abdomen. Describes as deep ache and cramps. No appreciable exacerbating or relieving factors. Fever and shaking chills. Reports severe chronic lymphedema with increased pain/redness in R lower leg in last couple days. Denies trauma. NO sick contacts. No diarrhea. No urinary complaints. No CP or respiratory complaints.   Past Medical History  Diagnosis Date  . Hypertension   . CHF (congestive heart failure) ([place])   . Thyroid disease    Past Surgical History  Procedure Laterality Date  . Tubal ligation    . Abdominal hysterectomy    . Appendectomy    . Tonsillectomy     Family History  Problem Relation Age of Onset  . Multiple myeloma Father   . Coronary artery disease Mother    Social History  Substance Use Topics  . Smoking status: Never Smoker   . Smokeless tobacco: None  . Alcohol Use: No   OB History    No data available     Review of Systems  All systems reviewed and negative, other than as noted in HPI.   Allergies  Flagyl and Penicillins  Home Medications   Prior to Admission medications   Medication Sig Start Date End Date Taking? Authorizing Provider  acetaminophen (TYLENOL) 500 MG tablet Take 500-1,000 mg by mouth every 6 (six) hours as needed for moderate pain.   Yes Historical Provider, MD  aspirin EC 81 MG tablet Take 81 mg by mouth daily.   Yes Historical Provider, MD  atorvastatin (LIPITOR) 20 MG tablet Take 20 mg by mouth daily.   Yes Historical Provider, MD  carvedilol (COREG) 12.5 MG tablet Take 12.5 mg by mouth 2 (two) times daily with a meal.   Yes Historical Provider, MD  co-enzyme  Q-10 30 MG capsule Take 30 mg by mouth daily.    Yes Historical Provider, MD  famotidine (PEPCID) 20 MG tablet Take 20 mg by mouth daily as needed for heartburn or indigestion.   Yes Historical Provider, MD  furosemide (LASIX) 20 MG tablet Take 20 mg by mouth daily.    Yes Historical Provider, MD  lisinopril (PRINIVIL,ZESTRIL) 5 MG tablet Take 5 mg by mouth daily.   Yes Historical Provider, MD  thyroid (ARMOUR) 15 MG tablet Take 15 mg by mouth daily.   Yes Historical Provider, MD  azithromycin (ZITHROMAX) 250 MG tablet Take 2 tabs po now then 1 tab po daily for the next 4 days Patient not taking: Reported on 09/09/2015 02/14/15   Thao P Le, DO  ondansetron (ZOFRAN ODT) 4 MG disintegrating tablet Take 1 tablet (4 mg total) by mouth every 8 (eight) hours as needed for nausea or vomiting. Patient not taking: Reported on 02/17/2015 02/14/15   Thao P Le, DO   BP 138/80 mmHg  Pulse 106  Temp(Src) 102.9 F (39.4 C) (Rectal)  Resp 16  Ht '5\' 2"'  (1.575 m)  Wt 225 lb (102.059 kg)  BMI 41.14 kg/m2  SpO2 98%  LMP 02/14/1995 Physical Exam  Constitutional: She appears well-developed and well-nourished. No distress.  Laying in bed. Conversant. Rigors.   HENT:  Head: Normocephalic and atraumatic.  Eyes: Conjunctivae are normal. Right  eye exhibits no discharge. Left eye exhibits no discharge.  Neck: Neck supple.  Cardiovascular: Regular rhythm and normal heart sounds.  Exam reveals no gallop and no friction rub.   No murmur heard. Tachycardia  Pulmonary/Chest: Effort normal and breath sounds normal. No respiratory distress.  Abdominal: Soft. She exhibits no distension. There is no tenderness.  Musculoskeletal: She exhibits no edema or tenderness.  Neurological: She is alert.  Skin: Skin is warm and dry.  Severe lymphedema bilateral lower extremities with chronic appearing skin changes but right lower extremity does appear to have a superimposed cellulitis. Increased warmth, erythema and tenderness to  palpation of the distal right leg going to about the level of the ankle. Does not extend significantly onto foot.  Psychiatric: She has a normal mood and affect. Her behavior is normal. Thought content normal.  Nursing note and vitals reviewed.   ED Course  Procedures (including critical care time) Labs Review Labs Reviewed  COMPREHENSIVE METABOLIC PANEL - Abnormal; Notable for the following:    Glucose, Bld 145 (*)    Creatinine, Ser 1.01 (*)    GFR calc non Af Amer 54 (*)    All other components within normal limits  CBC - Abnormal; Notable for the following:    WBC 28.6 (*)    All other components within normal limits  CULTURE, BLOOD (ROUTINE X 2)  CULTURE, BLOOD (ROUTINE X 2)  LIPASE, BLOOD  URINALYSIS, ROUTINE W REFLEX MICROSCOPIC (NOT AT [facility])  I-STAT CG4 LACTIC ACID, ED    Imaging Review Dg Chest 2 View  09/09/2015  CLINICAL DATA:  Coughing with nausea and vomiting today. Chronic shortness of breath due to lymphedema. EXAM: CHEST  2 VIEW COMPARISON:  02/14/2015 radiographs. FINDINGS: The heart is mildly enlarged. There is increased pulmonary vascularity without overt pulmonary edema, confluent airspace opacity or significant pleural effusion. Paraspinal osteophytes are noted throughout the thoracic spine. There are mild acromioclavicular degenerative changes bilaterally. IMPRESSION: Mild cardiomegaly with pulmonary vascular congestion. No focal airspace disease or overt pulmonary edema. Electronically Signed   By: Richardean Sale M.D.   On: 09/09/2015 19:00   I have personally reviewed and evaluated these images and lab results as part of my medical decision-making.   EKG Interpretation   Date/Time:  Monday September 09 2015 13:54:50 EST Ventricular Rate:  108 PR Interval:  104 QRS Duration: 178 QT Interval:  352 QTC Calculation: 472 R Axis:   -50 Text Interpretation:  Sinus tachycardia Left bundle branch block Confirmed  by Wilson Singer  MD, Evalene Vath ([id number]) on 09/09/2015 6:13:05  PM      MDM   Final diagnoses:  Cellulitis of right leg without foot  Sepsis, due to unspecified organism [facility])    74yF who presented with abdominal pain and n/v. Febrile. Abdominal exam itself is pretty benign. Tachycardic on arrival but improving with IVF.  Does have cellulitis of RLE. Not convinced all her systemic symptoms are from this though. UA pending. Some studies have shown increased OR of gram negative bacteremia in patients with rigors, although this is certainly nonspecific. Blood cultures and lactic acid ordered. Vanc for cellulitis. No hypotension. Additional coverage deferred at this time Admission.     Virgel Manifold, MD 09/11/15 0900

## 2015-09-09 NOTE — ED Notes (Signed)
1 IV start attempt 

## 2015-09-09 NOTE — ED Notes (Signed)
Patient transported to X-ray 

## 2015-09-09 NOTE — ED Notes (Signed)
Pt c/o dizziness, low abdominal pain, vomiting since this morning. Pt has had episodes of this in the past that have resolved on their own. Pt A&Ox4 and ambulatory. Pt denies chest pain, SOB.

## 2015-09-09 NOTE — Progress Notes (Signed)
Pharmacy Antibiotic Note  Lisa [name] is a 75 y.o. female admitted on 09/09/2015 with coughing, nausea and vomiting.  PMHx significant for lymphedema, diastolic heart failure, and HTN.  Per care everywhere notes, [facility] referred pt to wound care center for assistance with lymphadema in Aug '16 but unclear if pt was able to be seen. In [place], CXR negative for airspace disease.   Pharmacy has been consulted for Vancomycin dosing for cellulitis.  No provider notes yet for additional information.   Plan: CrCl ~55 ml/min, updated wt 102kg.  Elevated WBC.  Afebrile.   Vancomycin 2g IV x 1, then 1250mg  IV q24h  VT goal 10-15 mcg/ml for cellulitis  F/u renal fxn, clinical course    Temp (24hrs), Avg:98.4 F (36.9 C), Min:98.3 F (36.8 C), Max:98.5 F (36.9 C)   Recent Labs Lab 09/09/15 1401  WBC 28.6*  CREATININE 1.01*    CrCl cannot be calculated (Unknown ideal weight.).    Allergies  Allergen Reactions  . Flagyl [Metronidazole] Itching  . Penicillins Hives and Itching    Has patient had a PCN reaction causing immediate rash, facial/tongue/throat swelling, SOB or lightheadedness with hypotension: Yes  Has patient had a PCN reaction causing severe rash involving mucus membranes or skin necrosis: Yes Has patient had a PCN reaction that required hospitalization No Has patient had a PCN reaction occurring within the last 10 years: NO If all of the above answers are "NO", then may proceed with Cephalosporin use.     Antimicrobials this admission: 1/30 >> Vancomycin >>  Dose adjustments this admission: N/A  Microbiology results: 1/30 2/2 BCx: Ordered  Thank you for allowing pharmacy to be a part of this patient's care.  Ralene Bathe, PharmD, BCPS 09/09/2015, 7:54 PM  Pager: [phone]

## 2015-09-09 NOTE — H&P (Signed)
History and Physical  Patient Name: Lisa [name]     WUJ:[id number]    DOB: 1941/02/09    DOA: 09/09/2015 Referring physician: Virgel Manifold, MD PCP: No primary care provider on file.      Chief Complaint: Nausea/vomiting and chills  HPI: Lisa [name] is a 75 y.o. 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  The patient was in her usual state of health until the last few days when she developed indigestion, but was otherwise well.  Then today she woke up with nausea, had NBNB emesis several times and was dizzy and febrile.  This was not preceded or associated with by cough, sputum, dyspnea, dysuria, hematuria, urinary urgency, or diarrhea.  She had noted increased pain in the last week in her right ankle skin, "stinging" in character as if from a break in the skin, similar to previous episodes when she thought she had cellulitis due to her chronic lymphedema, but not associated with change/increase in pain/swelling/redness of the leg.  In the ED, she had rectal tem 102.78F, tachycardia and leukocytosis 28.6K/uL.  A CXR and UA were unremarkable, and she reported some increased pain in her      Review of Systems:  Pt complains of fever, chills, NBNB emesis, abdominal cramps, malaise, dizziness, chronic nonproductive cough, chronic leg swelling and discomfort, chronic leg redness. Pt denies any worsening cough, sputum, dyspnea, orthopnea, PND, urinary symptoms, diarrhea, hematochezia.  All other systems negative except as just noted or noted in the history of present illness.  Allergies  Allergen Reactions  . Flagyl [Metronidazole] Itching  . Penicillins Hives and Itching    Has patient had a PCN reaction causing immediate rash, facial/tongue/throat swelling, SOB or lightheadedness with hypotension: Yes  Has patient had a PCN reaction causing severe  rash involving mucus membranes or skin necrosis: Yes Has patient had a PCN reaction that required hospitalization No Has patient had a PCN reaction occurring within the last 10 years: NO If all of the above answers are "NO", then may proceed with Cephalosporin use.     Prior to Admission medications   Medication Sig Start Date End Date Taking? Authorizing Provider  acetaminophen (TYLENOL) 500 MG tablet Take 500-1,000 mg by mouth every 6 (six) hours as needed for moderate pain.   Yes Historical Provider, MD  aspirin EC 81 MG tablet Take 81 mg by mouth daily.   Yes Historical Provider, MD  atorvastatin (LIPITOR) 20 MG tablet Take 20 mg by mouth daily.   Yes Historical Provider, MD  carvedilol (COREG) 12.5 MG tablet Take 12.5 mg by mouth 2 (two) times daily with a meal.   Yes Historical Provider, MD  co-enzyme Q-10 30 MG capsule Take 30 mg by mouth daily.    Yes Historical Provider, MD  famotidine (PEPCID) 20 MG tablet Take 20 mg by mouth daily as needed for heartburn or indigestion.   Yes Historical Provider, MD  furosemide (LASIX) 20 MG tablet Take 20 mg by mouth daily.    Yes Historical Provider, MD  lisinopril (PRINIVIL,ZESTRIL) 5 MG tablet Take 5 mg by mouth daily.   Yes Historical Provider, MD  thyroid (ARMOUR) 15 MG tablet Take 15 mg by mouth daily.   Yes Historical Provider, MD  azithromycin (ZITHROMAX) 250 MG tablet Take 2 tabs po now then 1 tab po daily for the next 4 days Patient not taking: Reported  on 09/09/2015 02/14/15   Thao P Le, DO  ondansetron (ZOFRAN ODT) 4 MG disintegrating tablet Take 1 tablet (4 mg total) by mouth every 8 (eight) hours as needed for nausea or vomiting. Patient not taking: Reported on 02/17/2015 02/14/15   Glenford Bayley, DO    Past Medical History  Diagnosis Date  . Hypertension   . CHF (congestive heart failure) ([place])   . Thyroid disease   . Melanoma [facility])     Past Surgical History  Procedure Laterality Date  . Tubal ligation    . Abdominal hysterectomy     . Appendectomy    . Tonsillectomy      Family history: family history includes Coronary artery disease in her mother; Multiple myeloma in her father; Thyroid disease in her mother.  Social History: Patient lives alone.  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 Three of her children are physicians.  She is a nonsmoker.  She does not use a cane or walker and still drives and is independent with all ADLs and IADLs.       Physical Exam: BP 149/90 mmHg  Pulse 110  Temp(Src) 102.9 F (39.4 C) (Rectal)  Resp 18  Ht _0  (1.575 m)  Wt 102.059 kg (225 lb)  BMI 41.14 kg/m2  SpO2 95%  LMP 02/14/1995 General appearance: Elderly obese female, alert and in no acute distress, mildly ill appearing.   Eyes: Anicteric, conjunctiva pink, lids and lashes normal.     ENT: No nasal deformity, discharge, or epistaxis.  OP moist without lesions.   Lymph: No cervical or supraclavicular lymphadenopathy. Skin: Warm and moist, normal cap refill.  No jaundice.  On the bilateral lower extremities there is marked edema, brawny skin changes/thickening, and redness.  On the anterior right shin, there is a large indentation.  On the lateral ankle crease, there is a small area that she indicates is painful/stinging, which is surrounded by ringed white scale.  Overall, both lower extremities are mildly warm, red, and tender, as chronically. Cardiac: Tachycardic, regular, nl S1-S2, 1/6 SEM.  Capillary refill is brisk.  JVP not visible.  Radial pulses 2+ and symmetric. Respiratory: Normal respiratory rate and rhythm.  CTAB without rales or wheezes. Abdomen: Hyperactive bowel sounds.  Abdomen soft without rigidity.  Mild diffuse TTP, no guarding. No ascites, distension.   Neuro: Cranial nerves grossly normal.  Sensorium intact and responding to questions, attention normal.  Speech is fluent.  Moves all extremities equally and with normal coordination.    Psych: Behavior appropriate.  Affect normal.  No evidence of  aural or visual hallucinations or delusions.       Labs on Admission:  The metabolic panel shows normal electrolytes and renal function. The complete blood count shows leukocytosis 28.6K/uL without anemia or thrombocytopenia. Lactate 1.45 mmol/L. Lipase normal. Transaminases and bilirubin normal.     Radiological Exams on Admission: Personally reviewed: Dg Chest 2 View 09/09/2015  Cardiomegaly. Compared with previous, there is no change.  No lobar opacity.   EKG: Independently reviewed. Rate 108, sinus rhythm, LBBB.  Reports from [place] indicated that LBBB is old at least 3 years.   Echocardiogram report 10/01/14 in [place]: EF 50%, no significant valvular disease    Assessment/Plan 1. SIRS syndrome:  This is new.  The patient's presentation is nonspecific (vomiting, chills, malaise) and her only localizing symptoms are her right leg which is more painful than usual.  Diagnostic considerations include influenza, cellulitis causing SIRS, PNA (doubted given normal CXR), UTI (  doubted given absence of pyuria/dysuria), or viral GE (doubted given fever and WBC).   -Supportive care with fluids and ondansetron -Cefazolin for suspected cellulitis -Check flu swab and place on precautions -Empiric oseltamivir for now and droplet precautions -Follow blood culture   2. Cellulitis and chronic lymphedema:  The patient has increased pain of her chronically swollen right ankle. -Cefazolin as above  3. Chronic diastolic and former systolic CHF:  -Continue aspirin, statin, BB, and ACEi -I will hold furosemide while rehydrating, to be restarted when clinically resolving SIRS  4. HTN:  -Continue BB and ACEi  5. Hypothyroidism:  -Continue Armour thyroid       DVT PPx: Lovenox Diet: Heart healthy Consultants: None Code Status: Full Family Communication: Daughter, present at bedside  Medical decision making: What exists of the patient's previous chart and  CareEverywhere was reviewed in depth and the case was discussed with Dr. Wilson Singer. Patient seen 8:58 PM on 09/09/2015.  Disposition Plan:  I recommend admission to telemetry given history of CHF and LBBB.  Clinical condition: stable.  Anticipate observation overnight.  If influenza negative and symptoms improving tomorrow, will presume viral syndrome and discharge with Keflex.      Edwin Dada [facility] Pager [phone]

## 2015-09-10 DIAGNOSIS — Z881 Allergy status to other antibiotic agents status: Secondary | ICD-10-CM | POA: Diagnosis not present

## 2015-09-10 DIAGNOSIS — I110 Hypertensive heart disease with heart failure: Secondary | ICD-10-CM | POA: Diagnosis present

## 2015-09-10 DIAGNOSIS — Z6841 Body Mass Index (BMI) 40.0 and over, adult: Secondary | ICD-10-CM | POA: Diagnosis not present

## 2015-09-10 DIAGNOSIS — R509 Fever, unspecified: Secondary | ICD-10-CM | POA: Diagnosis not present

## 2015-09-10 DIAGNOSIS — Z79899 Other long term (current) drug therapy: Secondary | ICD-10-CM | POA: Diagnosis not present

## 2015-09-10 DIAGNOSIS — R0989 Other specified symptoms and signs involving the circulatory and respiratory systems: Secondary | ICD-10-CM | POA: Diagnosis present

## 2015-09-10 DIAGNOSIS — Z880 Allergy status to penicillin: Secondary | ICD-10-CM | POA: Diagnosis not present

## 2015-09-10 DIAGNOSIS — I878 Other specified disorders of veins: Secondary | ICD-10-CM | POA: Diagnosis present

## 2015-09-10 DIAGNOSIS — I10 Essential (primary) hypertension: Secondary | ICD-10-CM | POA: Diagnosis not present

## 2015-09-10 DIAGNOSIS — M7989 Other specified soft tissue disorders: Secondary | ICD-10-CM | POA: Diagnosis present

## 2015-09-10 DIAGNOSIS — L03115 Cellulitis of right lower limb: Secondary | ICD-10-CM | POA: Diagnosis present

## 2015-09-10 DIAGNOSIS — I447 Left bundle-branch block, unspecified: Secondary | ICD-10-CM | POA: Diagnosis present

## 2015-09-10 DIAGNOSIS — Z85820 Personal history of malignant melanoma of skin: Secondary | ICD-10-CM | POA: Diagnosis not present

## 2015-09-10 DIAGNOSIS — R112 Nausea with vomiting, unspecified: Secondary | ICD-10-CM | POA: Diagnosis present

## 2015-09-10 DIAGNOSIS — I5042 Chronic combined systolic (congestive) and diastolic (congestive) heart failure: Secondary | ICD-10-CM | POA: Diagnosis present

## 2015-09-10 DIAGNOSIS — I428 Other cardiomyopathies: Secondary | ICD-10-CM | POA: Diagnosis present

## 2015-09-10 DIAGNOSIS — N183 Chronic kidney disease, stage 3 (moderate): Secondary | ICD-10-CM | POA: Diagnosis present

## 2015-09-10 DIAGNOSIS — L0390 Cellulitis, unspecified: Secondary | ICD-10-CM | POA: Diagnosis present

## 2015-09-10 DIAGNOSIS — E039 Hypothyroidism, unspecified: Secondary | ICD-10-CM | POA: Diagnosis present

## 2015-09-10 DIAGNOSIS — Z8249 Family history of ischemic heart disease and other diseases of the circulatory system: Secondary | ICD-10-CM | POA: Diagnosis not present

## 2015-09-10 DIAGNOSIS — Z8489 Family history of other specified conditions: Secondary | ICD-10-CM | POA: Diagnosis not present

## 2015-09-10 DIAGNOSIS — A419 Sepsis, unspecified organism: Secondary | ICD-10-CM | POA: Diagnosis present

## 2015-09-10 DIAGNOSIS — Z7982 Long term (current) use of aspirin: Secondary | ICD-10-CM | POA: Diagnosis not present

## 2015-09-10 DIAGNOSIS — Z807 Family history of other malignant neoplasms of lymphoid, hematopoietic and related tissues: Secondary | ICD-10-CM | POA: Diagnosis not present

## 2015-09-10 DIAGNOSIS — I130 Hypertensive heart and chronic kidney disease with heart failure and stage 1 through stage 4 chronic kidney disease, or unspecified chronic kidney disease: Secondary | ICD-10-CM | POA: Diagnosis present

## 2015-09-10 DIAGNOSIS — I890 Lymphedema, not elsewhere classified: Secondary | ICD-10-CM | POA: Diagnosis present

## 2015-09-10 DIAGNOSIS — I5032 Chronic diastolic (congestive) heart failure: Secondary | ICD-10-CM | POA: Diagnosis not present

## 2015-09-10 LAB — CBC
HCT: 34.2 % — ABNORMAL LOW (ref 36.0–46.0)
Hemoglobin: 10.7 g/dL — ABNORMAL LOW (ref 12.0–15.0)
MCH: 27.3 pg (ref 26.0–34.0)
MCHC: 31.3 g/dL (ref 30.0–36.0)
MCV: 87.2 fL (ref 78.0–100.0)
Platelets: 179 10*3/uL (ref 150–400)
RBC: 3.92 MIL/uL (ref 3.87–5.11)
RDW: 15.2 % (ref 11.5–15.5)
WBC: 14.9 10*3/uL — ABNORMAL HIGH (ref 4.0–10.5)

## 2015-09-10 LAB — BASIC METABOLIC PANEL
Anion gap: 8 (ref 5–15)
BUN: 14 mg/dL (ref 6–20)
CO2: 24 mmol/L (ref 22–32)
Calcium: 8.2 mg/dL — ABNORMAL LOW (ref 8.9–10.3)
Chloride: 104 mmol/L (ref 101–111)
Creatinine, Ser: 1.04 mg/dL — ABNORMAL HIGH (ref 0.44–1.00)
GFR calc Af Amer: >60 mL/min (ref >60)
GFR calc non Af Amer: 52 mL/min — ABNORMAL LOW (ref >60)
Glucose, Bld: 153 mg/dL — ABNORMAL HIGH (ref 65–99)
Potassium: 3.8 mmol/L (ref 3.5–5.1)
Sodium: 136 mmol/L (ref 135–145)

## 2015-09-10 LAB — INFLUENZA PANEL BY PCR (TYPE A & B)
H1N1 flu by pcr: NOT DETECTED
Influenza A By PCR: NEGATIVE
Influenza B By PCR: NEGATIVE

## 2015-09-10 MED ORDER — FUROSEMIDE 20 MG PO TABS
20.0000 mg | ORAL_TABLET | Freq: Every day | ORAL | Status: DC
  Administered 2015-09-11: 20 mg via ORAL
  Filled 2015-09-10 (×2): qty 1

## 2015-09-10 NOTE — Progress Notes (Addendum)
PROGRESS NOTE  Lisa [name] [id number] DOB: 09/09/1940 DOA: 09/09/2015 PCP: No primary care provider on file.   HPI: 75 y.o. 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 She was found to be likely septic due to right lower extremity cellulitis and admitted for IV antibiotics.  Subjective / 24 H Interval events - She has no complaints this morning, feels great, asking about going home - Endorses mild right lower extremity pain and redness  Assessment/Plan: Principal Problem:   Fever Active Problems:   Other specified hypothyroidism   Chronic diastolic CHF (congestive heart failure) (HCC)   Essential hypertension   Cellulitis   Sepsis due to right lower extremity cellulitis - Patient admitted last night with fever, tachycardia, elevated white count and a source in the right lower extremity concerning for cellulitis. She was started on cefazolin, with significant improvement overnight, this morning she is afebrile and her white count is improving. Patient is asking to go home, however she was still febrile overnight and if he could be too premature - Blood cultures obtained on admission, no growth to date - Suspect she could be transitioned to oral antibiotics tomorrow if the cultures remain negative and consider home discharge  Cellulitis and chronic lymphedema - She has bilateral lower extremity lymphedema and chronic venous stasis changes, more erythematous on the right with underlying cellulitis. She has no significant tenderness.  Chronic diastolic heart failure - Initial chest x-ray showed pulmonary vascular congestion, she was given fluids overnight and her Lasix was held - We'll resume her home Lasix this morning - She has bilateral lower extremity swelling, however this is chronic, she has no crackles on exam nor JVD, she is on room  air   Hypertension - Continue her home medications  Hypothyroidism  - Continue her home medications   Chronic kidney disease stage III - Creatinine is at baseline  Morbid obesity   Diet: Diet Heart Room service appropriate?: Yes; Fluid consistency:: Thin Fluids: none DVT Prophylaxis: Lovenox  Code Status: Full Code Family Communication: no family bedside  Disposition Plan: home 1-2 days  Barriers to discharge: IV antibiotics, blood cultures pending  Consultants:  None   Procedures:  None    Antibiotics Cefazolin 1/30 >>   Studies  Dg Chest 2 View  09/09/2015  CLINICAL DATA:  Coughing with nausea and vomiting today. Chronic shortness of breath due to lymphedema. EXAM: CHEST  2 VIEW COMPARISON:  02/14/2015 radiographs. FINDINGS: The heart is mildly enlarged. There is increased pulmonary vascularity without overt pulmonary edema, confluent airspace opacity or significant pleural effusion. Paraspinal osteophytes are noted throughout the thoracic spine. There are mild acromioclavicular degenerative changes bilaterally. IMPRESSION: Mild cardiomegaly with pulmonary vascular congestion. No focal airspace disease or overt pulmonary edema. Electronically Signed   By: Richardean Sale M.D.   On: 09/09/2015 19:00    Objective  Filed Vitals:   09/10/15 0352 09/10/15 0622 09/10/15 0627 09/10/15 0816  BP:   119/42 125/56  Pulse:   91 88  Temp: 100.5 F (38.1 C) 98.7 F (37.1 C) 98.2 F (36.8 C)   TempSrc: Oral Oral Oral   Resp:   18   Height:      Weight:      SpO2:   95%     Intake/Output Summary (Last 24 hours) at 09/10/15 1054 Last data filed at 09/09/15 2221  Gross per 24  hour  Intake   1000 ml  Output      0 ml  Net   1000 ml   Filed Weights   09/09/15 1919 09/09/15 2247  Weight: 102.059 kg (225 lb) 119.4 kg (263 lb 3.7 oz)   Exam:  GENERAL: NAD  HEENT: no scleral icterus, PERRL  NECK: supple, no LAD  LUNGS: CTA biL, no wheezing  HEART: RRR without  MRG  ABDOMEN: soft, non tender  EXTREMITIES: no clubbing / cyanosis. Bilateral lower extremity lymphedema, chronic venous stasis changes, mild erythema on the posterior aspect of the right calf, not tender to palpation, warm to touch  NEUROLOGIC: non focal  Data Reviewed: Basic Metabolic Panel:  Recent Labs Lab 09/09/15 1401 09/10/15 0555  NA 138 136  K 4.4 3.8  CL 105 104  CO2 23 24  GLUCOSE 145* 153*  BUN 15 14  CREATININE 1.01* 1.04*  CALCIUM 9.2 8.2*   Liver Function Tests:  Recent Labs Lab 09/09/15 1401  AST 20  ALT 16  ALKPHOS 81  BILITOT 1.2  PROT 7.6  ALBUMIN 4.0    Recent Labs Lab 09/09/15 1401  LIPASE 23   CBC:  Recent Labs Lab 09/09/15 1401 09/10/15 0555  WBC 28.6* 14.9*  HGB 13.2 10.7*  HCT 41.7 34.2*  MCV 87.8 87.2  PLT 236 179    Recent Results (from the past 240 hour(s))  Blood culture (routine x 2)     Status: None (Preliminary result)   Collection Time: 09/09/15  7:16 PM  Result Value Ref Range Status   Specimen Description BLOOD LEFT ANTECUBITAL  Final   Special Requests   Final    BOTTLES DRAWN AEROBIC AND ANAEROBIC 5CC Performed at [facility]    Culture PENDING  Incomplete   Report Status PENDING  Incomplete  Blood culture (routine x 2)     Status: None (Preliminary result)   Collection Time: 09/09/15  7:30 PM  Result Value Ref Range Status   Specimen Description BLOOD RIGHT ANTECUBITAL  Final   Special Requests   Final    BOTTLES DRAWN AEROBIC AND ANAEROBIC 5CC Performed at [facility]    Culture PENDING  Incomplete   Report Status PENDING  Incomplete     Scheduled Meds: . aspirin EC  81 mg Oral Daily  . atorvastatin  20 mg Oral Daily  . carvedilol  12.5 mg Oral BID WC  .  ceFAZolin (ANCEF) IV  2 g Intravenous 3 times per day  . enoxaparin (LOVENOX) injection  60 mg Subcutaneous QHS  . lisinopril  5 mg Oral Daily  . oseltamivir  75 mg Oral BID  . sodium chloride flush  3 mL Intravenous Q12H  .  thyroid  30 mg Oral Daily   Continuous Infusions:     Marzetta Board, MD [facility] Pager [phone]. If 7 PM - 7 AM, please contact night-coverage at [email or web address], password [email or web address] 09/10/2015, 10:54 AM

## 2015-09-10 NOTE — Progress Notes (Signed)
Patient had runs of SVT lasted less than 1 minute, patient denies any pain/distress, Dr. Hollie Beach notified, will continue to assess patient.

## 2015-09-10 NOTE — Clinical Documentation Improvement (Signed)
Hospitalist  (Query responses must be documented in the current medical record, not on the CDI BPA form.)  Please document if a condition below provides greater specificity regarding the patient's BMI of 48.13.  Possible Clinical Conditions:  - Morbid Obesity, BMI 48.13  - Other condition  - Unable to clinically determine  Clinical Information/Indicators: Patient has a reported BMI of 48.13 per Admission Orders dated 09/09/15.    Please exercise your independent, professional judgment when responding. A specific answer is not anticipated or expected.   Thank You, Erling Conte   RN BSN CCDS [phone] Health Information Management Olivia Lopez de Gutierrez

## 2015-09-10 NOTE — Plan of Care (Signed)
Problem: Safety: Goal: Ability to remain free from injury will improve Continue.    Problem: Skin Integrity: Goal: Risk for impaired skin integrity will decrease Bilateral lower extremities reddened and edematous.  Denies pain. No open wounds noted.

## 2015-09-11 DIAGNOSIS — I469 Cardiac arrest, cause unspecified: Secondary | ICD-10-CM

## 2015-09-11 DIAGNOSIS — R509 Fever, unspecified: Secondary | ICD-10-CM

## 2015-09-11 DIAGNOSIS — L03115 Cellulitis of right lower limb: Secondary | ICD-10-CM

## 2015-09-11 HISTORY — DX: Cardiac arrest, cause unspecified: I46.9

## 2015-09-11 LAB — BASIC METABOLIC PANEL
Anion gap: 7 (ref 5–15)
BUN: 13 mg/dL (ref 6–20)
CO2: 25 mmol/L (ref 22–32)
Calcium: 8.5 mg/dL — ABNORMAL LOW (ref 8.9–10.3)
Chloride: 105 mmol/L (ref 101–111)
Creatinine, Ser: 0.94 mg/dL (ref 0.44–1.00)
GFR calc Af Amer: >60 mL/min (ref >60)
GFR calc non Af Amer: 59 mL/min — ABNORMAL LOW (ref >60)
Glucose, Bld: 131 mg/dL — ABNORMAL HIGH (ref 65–99)
Potassium: 4.1 mmol/L (ref 3.5–5.1)
Sodium: 137 mmol/L (ref 135–145)

## 2015-09-11 LAB — URINE CULTURE

## 2015-09-11 LAB — CBC
HCT: 35.3 % — ABNORMAL LOW (ref 36.0–46.0)
Hemoglobin: 11 g/dL — ABNORMAL LOW (ref 12.0–15.0)
MCH: 27.4 pg (ref 26.0–34.0)
MCHC: 31.2 g/dL (ref 30.0–36.0)
MCV: 88 fL (ref 78.0–100.0)
Platelets: 185 10*3/uL (ref 150–400)
RBC: 4.01 MIL/uL (ref 3.87–5.11)
RDW: 15.1 % (ref 11.5–15.5)
WBC: 12 10*3/uL — ABNORMAL HIGH (ref 4.0–10.5)

## 2015-09-11 MED ORDER — ACYCLOVIR 5 % EX OINT
TOPICAL_OINTMENT | Freq: Three times a day (TID) | CUTANEOUS | Status: DC | PRN
  Filled 2015-09-11 (×2): qty 15

## 2015-09-11 MED ORDER — DOXYCYCLINE HYCLATE 100 MG PO TABS
100.0000 mg | ORAL_TABLET | Freq: Two times a day (BID) | ORAL | Status: DC
  Administered 2015-09-11: 100 mg via ORAL
  Filled 2015-09-11 (×2): qty 1

## 2015-09-11 MED ORDER — ACYCLOVIR 5 % EX OINT: TOPICAL_OINTMENT | Freq: Three times a day (TID) | CUTANEOUS | Status: DC | PRN

## 2015-09-11 MED ORDER — DOXYCYCLINE HYCLATE 100 MG PO TABS: 100.0000 mg | ORAL_TABLET | Freq: Two times a day (BID) | ORAL | Status: DC

## 2015-09-11 NOTE — Care Management Note (Signed)
Case Management Note  Patient Details  Name: Lisa [name] MRN: [id number] Date of Birth: Jun 01, 1941  Subjective/Objective:74 y/o f admitted w/Fever. From  home.                    Action/Plan:d/c SNF.   Expected Discharge Date:   (unknown)               Expected Discharge Plan:  Home/Self Care  In-House Referral:     Discharge planning Services  CM Consult  Post Acute Care Choice:    Choice offered to:     DME Arranged:    DME Agency:     HH Arranged:    Carl Agency:     Status of Service:  Completed, signed off  Medicare Important Message Given:    Date Medicare IM Given:    Medicare IM give by:    Date Additional Medicare IM Given:    Additional Medicare Important Message give by:     If discussed at [place] of Stay Meetings, dates discussed:    Additional Comments:  Dessa Phi, RN 09/11/2015, 11:05 AM

## 2015-09-11 NOTE — Discharge Summary (Signed)
Physician Discharge Summary  Lisa [name] [id number] DOB: 1941-07-03 DOA: 09/09/2015  PCP: No primary care provider on file.  Admit date: 09/09/2015 Discharge date: 09/11/2015  Recommendations for Outpatient Follow-up:  1. Pt will need to follow up with PCP in 2-3 weeks post discharge 2. Please obtain BMP to evaluate electrolytes and kidney function 3. Please also check CBC to evaluate Hg and Hct levels 4. Pt advised to continue taking doxycycline for 7 more days post discharge   Discharge Diagnoses:  Principal Problem:   Fever Active Problems:   Other specified hypothyroidism   Chronic diastolic CHF (congestive heart failure) (HCC)   Essential hypertension   Cellulitis   Sepsis ([place])  Discharge Condition: Stable  Diet recommendation: Heart healthy diet discussed in details   HPI: 75 y.o. 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 She was found to be likely septic due to right lower extremity cellulitis and admitted for IV antibiotics.  Assessment/Plan: Sepsis due to right lower extremity cellulitis - Patient admitted last night with fever, tachycardia, elevated white count and a source in the right lower extremity concerning for cellulitis. She was started on cefazolin, with significant improvement overnight - discharge on oral doxy to complete therapy  - pt insists on going home   Cellulitis and chronic lymphedema - She has bilateral lower extremity lymphedema and chronic venous stasis changes, more erythematous on the right with underlying cellulitis.  - says now she is at her baseline and wants to go home   Chronic diastolic heart failure - Initial chest x-ray showed pulmonary vascular congestion, she was given fluids overnight and her Lasix was held - resume home regimen with lasix upon discharge   Hypertension - Continue her  home medications  Hypothyroidism  - Continue her home medications   Chronic kidney disease stage III - Creatinine is at baseline  Code Status: Full Code Family Communication: no family bedside  Disposition Plan: home   Consultants:  None  Procedures:  None  Discharge Exam: Filed Vitals:   09/10/15 2100 09/11/15 0559  BP: 115/55 130/79  Pulse: 95 94  Temp: 98.4 F (36.9 C) 98.3 F (36.8 C)  Resp: 20 18   Filed Vitals:   09/10/15 0816 09/10/15 1500 09/10/15 2100 09/11/15 0559  BP: 125/56 119/52 115/55 130/79  Pulse: 88 86 95 94  Temp:  97.8 F (36.6 C) 98.4 F (36.9 C) 98.3 F (36.8 C)  TempSrc:  Oral Oral Oral  Resp:  18 20 18   Height:      Weight:    118.57 kg (261 lb 6.4 oz)  SpO2:  98% 97% 95%    General: Pt is alert, follows commands appropriately, not in acute distress Cardiovascular: Regular rate and rhythm, no rubs, no gallops Respiratory: Clear to auscultation bilaterally, no wheezing, no crackles, no rhonchi Abdominal: Soft, non tender, non distended, bowel sounds +, no guarding   Discharge Instructions  Discharge Instructions    Diet - low sodium heart healthy    Complete by:  As directed      Increase activity slowly    Complete by:  As directed             Medication List    TAKE these medications        acetaminophen 500 MG tablet  Commonly known as:  TYLENOL  Take 500-1,000 mg by mouth every 6 (  six) hours as needed for moderate pain.     aspirin EC 81 MG tablet  Take 81 mg by mouth daily.     atorvastatin 20 MG tablet  Commonly known as:  LIPITOR  Take 20 mg by mouth daily.     carvedilol 12.5 MG tablet  Commonly known as:  COREG  Take 12.5 mg by mouth 2 (two) times daily with a meal.     co-enzyme Q-10 30 MG capsule  Take 30 mg by mouth daily.     doxycycline 100 MG tablet  Commonly known as:  VIBRA-TABS  Take 1 tablet (100 mg total) by mouth every 12 (twelve) hours.     famotidine 20 MG tablet  Commonly known as:   PEPCID  Take 20 mg by mouth daily as needed for heartburn or indigestion.     furosemide 20 MG tablet  Commonly known as:  LASIX  Take 20 mg by mouth daily.     lisinopril 5 MG tablet  Commonly known as:  PRINIVIL,ZESTRIL  Take 5 mg by mouth daily.     thyroid 15 MG tablet  Commonly known as:  ARMOUR  Take 15 mg by mouth daily.           Follow-up Information    Call Faye Ramsay, MD.   Specialty:  Internal Medicine   Why:  As needed   Contact information:   [address] [place] [address] [phone]        The results of significant diagnostics from this hospitalization (including imaging, microbiology, ancillary and laboratory) are listed below for reference.     Microbiology: Recent Results (from the past 240 hour(s))  Blood culture (routine x 2)     Status: None (Preliminary result)   Collection Time: 09/09/15  7:16 PM  Result Value Ref Range Status   Specimen Description BLOOD LEFT ANTECUBITAL  Final   Special Requests BOTTLES DRAWN AEROBIC AND ANAEROBIC 5CC  Final   Culture   Final    NO GROWTH < 24 HOURS Performed at [facility]    Report Status PENDING  Incomplete  Blood culture (routine x 2)     Status: None (Preliminary result)   Collection Time: 09/09/15  7:30 PM  Result Value Ref Range Status   Specimen Description BLOOD RIGHT ANTECUBITAL  Final   Special Requests   Final    BOTTLES DRAWN AEROBIC AND ANAEROBIC 5CC Performed at [facility]    Culture PENDING  Incomplete   Report Status PENDING  Incomplete     Labs: Basic Metabolic Panel:  Recent Labs Lab 09/09/15 1401 09/10/15 0555 09/11/15 0600  NA 138 136 137  K 4.4 3.8 4.1  CL 105 104 105  CO2 23 24 25   GLUCOSE 145* 153* 131*  BUN 15 14 13   CREATININE 1.01* 1.04* 0.94  CALCIUM 9.2 8.2* 8.5*   Liver Function Tests:  Recent Labs Lab 09/09/15 1401  AST 20  ALT 16  ALKPHOS 81  BILITOT 1.2  PROT 7.6  ALBUMIN 4.0    Recent  Labs Lab 09/09/15 1401  LIPASE 23   CBC:  Recent Labs Lab 09/09/15 1401 09/10/15 0555 09/11/15 0600  WBC 28.6* 14.9* 12.0*  HGB 13.2 10.7* 11.0*  HCT 41.7 34.2* 35.3*  MCV 87.8 87.2 88.0  PLT 236 179 185   SIGNED: Time coordinating discharge: 30 minutes  MAGICK-Yahshua Thibault, MD  [facility] 09/11/2015, 10:05 AM Pager [phone]  If 7PM-7AM, please contact  night-coverage [email or web address] Password [email or web address]

## 2015-09-11 NOTE — Discharge Instructions (Signed)

## 2015-09-14 LAB — CULTURE, BLOOD (ROUTINE X 2)
Culture: NO GROWTH
Culture: NO GROWTH

## 2015-10-03 ENCOUNTER — Inpatient Hospital Stay (HOSPITAL_COMMUNITY)
Admission: EM | Admit: 2015-10-03 | Discharge: 2015-10-09 | DRG: 247 | Disposition: A | Discharge status: Home Health | Payer: Medicare Other | Attending: Cardiovascular Disease | Admitting: Cardiovascular Disease

## 2015-10-03 ENCOUNTER — Encounter (HOSPITAL_COMMUNITY): Payer: Self-pay | Admitting: Emergency Medicine

## 2015-10-03 ENCOUNTER — Emergency Department (HOSPITAL_COMMUNITY): Payer: Medicare Other

## 2015-10-03 ENCOUNTER — Encounter (HOSPITAL_COMMUNITY)
Admission: EM | Disposition: A | Discharge status: Home Health | Payer: Self-pay | Source: Home / Self Care | Attending: Cardiovascular Disease

## 2015-10-03 DIAGNOSIS — I462 Cardiac arrest due to underlying cardiac condition: Secondary | ICD-10-CM | POA: Diagnosis present

## 2015-10-03 DIAGNOSIS — I4901 Ventricular fibrillation: Principal | ICD-10-CM | POA: Diagnosis present

## 2015-10-03 DIAGNOSIS — Z880 Allergy status to penicillin: Secondary | ICD-10-CM

## 2015-10-03 DIAGNOSIS — I10 Essential (primary) hypertension: Secondary | ICD-10-CM | POA: Diagnosis present

## 2015-10-03 DIAGNOSIS — I5022 Chronic systolic (congestive) heart failure: Secondary | ICD-10-CM

## 2015-10-03 DIAGNOSIS — I200 Unstable angina: Secondary | ICD-10-CM | POA: Diagnosis present

## 2015-10-03 DIAGNOSIS — Z90710 Acquired absence of both cervix and uterus: Secondary | ICD-10-CM

## 2015-10-03 DIAGNOSIS — I5023 Acute on chronic systolic (congestive) heart failure: Secondary | ICD-10-CM | POA: Insufficient documentation

## 2015-10-03 DIAGNOSIS — Y92219 Unspecified school as the place of occurrence of the external cause: Secondary | ICD-10-CM

## 2015-10-03 DIAGNOSIS — E039 Hypothyroidism, unspecified: Secondary | ICD-10-CM | POA: Diagnosis present

## 2015-10-03 DIAGNOSIS — I469 Cardiac arrest, cause unspecified: Secondary | ICD-10-CM | POA: Diagnosis not present

## 2015-10-03 DIAGNOSIS — Z6841 Body Mass Index (BMI) 40.0 and over, adult: Secondary | ICD-10-CM

## 2015-10-03 DIAGNOSIS — I2510 Atherosclerotic heart disease of native coronary artery without angina pectoris: Secondary | ICD-10-CM

## 2015-10-03 DIAGNOSIS — E872 Acidosis: Secondary | ICD-10-CM | POA: Diagnosis present

## 2015-10-03 DIAGNOSIS — Z8249 Family history of ischemic heart disease and other diseases of the circulatory system: Secondary | ICD-10-CM

## 2015-10-03 DIAGNOSIS — I493 Ventricular premature depolarization: Secondary | ICD-10-CM | POA: Insufficient documentation

## 2015-10-03 DIAGNOSIS — I420 Dilated cardiomyopathy: Secondary | ICD-10-CM | POA: Diagnosis present

## 2015-10-03 DIAGNOSIS — I5032 Chronic diastolic (congestive) heart failure: Secondary | ICD-10-CM

## 2015-10-03 DIAGNOSIS — I25110 Atherosclerotic heart disease of native coronary artery with unstable angina pectoris: Secondary | ICD-10-CM | POA: Diagnosis present

## 2015-10-03 DIAGNOSIS — R7303 Prediabetes: Secondary | ICD-10-CM | POA: Diagnosis present

## 2015-10-03 DIAGNOSIS — Z955 Presence of coronary angioplasty implant and graft: Secondary | ICD-10-CM

## 2015-10-03 DIAGNOSIS — Z881 Allergy status to other antibiotic agents status: Secondary | ICD-10-CM

## 2015-10-03 DIAGNOSIS — N390 Urinary tract infection, site not specified: Secondary | ICD-10-CM

## 2015-10-03 DIAGNOSIS — Z79899 Other long term (current) drug therapy: Secondary | ICD-10-CM

## 2015-10-03 DIAGNOSIS — E785 Hyperlipidemia, unspecified: Secondary | ICD-10-CM | POA: Diagnosis present

## 2015-10-03 DIAGNOSIS — I5042 Chronic combined systolic (congestive) and diastolic (congestive) heart failure: Secondary | ICD-10-CM | POA: Diagnosis present

## 2015-10-03 DIAGNOSIS — Z7982 Long term (current) use of aspirin: Secondary | ICD-10-CM

## 2015-10-03 DIAGNOSIS — Z8674 Personal history of sudden cardiac arrest: Secondary | ICD-10-CM | POA: Diagnosis present

## 2015-10-03 DIAGNOSIS — I890 Lymphedema, not elsewhere classified: Secondary | ICD-10-CM

## 2015-10-03 HISTORY — PX: CARDIAC CATHETERIZATION: SHX172

## 2015-10-03 LAB — COMPREHENSIVE METABOLIC PANEL
ALT: 316 U/L — ABNORMAL HIGH (ref 14–54)
AST: 351 U/L — ABNORMAL HIGH (ref 15–41)
Albumin: 3.4 g/dL — ABNORMAL LOW (ref 3.5–5.0)
Alkaline Phosphatase: 88 U/L (ref 38–126)
Anion gap: 11 (ref 5–15)
BUN: 10 mg/dL (ref 6–20)
CO2: 21 mmol/L — ABNORMAL LOW (ref 22–32)
Calcium: 9.3 mg/dL (ref 8.9–10.3)
Chloride: 108 mmol/L (ref 101–111)
Creatinine, Ser: 1.08 mg/dL — ABNORMAL HIGH (ref 0.44–1.00)
GFR calc Af Amer: 58 mL/min — ABNORMAL LOW (ref >60)
GFR calc non Af Amer: 50 mL/min — ABNORMAL LOW (ref >60)
Glucose, Bld: 145 mg/dL — ABNORMAL HIGH (ref 65–99)
Potassium: 4.1 mmol/L (ref 3.5–5.1)
Sodium: 140 mmol/L (ref 135–145)
Total Bilirubin: 0.6 mg/dL (ref 0.3–1.2)
Total Protein: 6.6 g/dL (ref 6.5–8.1)

## 2015-10-03 LAB — PROTIME-INR
INR: 1.13 (ref 0.00–1.49)
Prothrombin Time: 14.7 seconds (ref 11.6–15.2)

## 2015-10-03 LAB — CBC WITH DIFFERENTIAL/PLATELET
Basophils Absolute: 0 10*3/uL (ref 0.0–0.1)
Basophils Relative: 0 %
Eosinophils Absolute: 0.1 10*3/uL (ref 0.0–0.7)
Eosinophils Relative: 1 %
HCT: 38.8 % (ref 36.0–46.0)
Hemoglobin: 12 g/dL (ref 12.0–15.0)
Lymphocytes Relative: 30 %
Lymphs Abs: 4.2 10*3/uL — ABNORMAL HIGH (ref 0.7–4.0)
MCH: 26.8 pg (ref 26.0–34.0)
MCHC: 30.9 g/dL (ref 30.0–36.0)
MCV: 86.8 fL (ref 78.0–100.0)
Monocytes Absolute: 0.6 10*3/uL (ref 0.1–1.0)
Monocytes Relative: 4 %
Neutro Abs: 9 10*3/uL — ABNORMAL HIGH (ref 1.7–7.7)
Neutrophils Relative %: 65 %
Platelets: 289 10*3/uL (ref 150–400)
RBC: 4.47 MIL/uL (ref 3.87–5.11)
RDW: 14.9 % (ref 11.5–15.5)
WBC: 13.9 10*3/uL — ABNORMAL HIGH (ref 4.0–10.5)

## 2015-10-03 LAB — I-STAT CG4 LACTIC ACID, ED: Lactic Acid, Venous: 3.73 mmol/L (ref 0.5–2.0)

## 2015-10-03 LAB — BRAIN NATRIURETIC PEPTIDE: B Natriuretic Peptide: 199.1 pg/mL — ABNORMAL HIGH (ref 0.0–100.0)

## 2015-10-03 LAB — MAGNESIUM: Magnesium: 2 mg/dL (ref 1.7–2.4)

## 2015-10-03 LAB — MRSA PCR SCREENING: MRSA by PCR: NEGATIVE

## 2015-10-03 LAB — TROPONIN I: Troponin I: <0.03 ng/mL

## 2015-10-03 LAB — LACTIC ACID, PLASMA: Lactic Acid, Venous: 1.8 mmol/L (ref 0.5–2.0)

## 2015-10-03 SURGERY — LEFT HEART CATH AND CORONARY ANGIOGRAPHY
Anesthesia: LOCAL

## 2015-10-03 MED ORDER — ONDANSETRON HCL 4 MG/2ML IJ SOLN: 4.0000 mg | Freq: Four times a day (QID) | INTRAMUSCULAR | Status: DC | PRN

## 2015-10-03 MED ORDER — HEPARIN (PORCINE) IN NACL 2-0.9 UNIT/ML-% IJ SOLN
INTRAMUSCULAR | Status: DC | PRN
  Administered 2015-10-03: 1000 mL

## 2015-10-03 MED ORDER — SODIUM CHLORIDE 0.9 % IV SOLN: 250.0000 mL | INTRAVENOUS | Status: DC | PRN

## 2015-10-03 MED ORDER — FUROSEMIDE 10 MG/ML IJ SOLN: 40.0000 mg | Freq: Two times a day (BID) | INTRAMUSCULAR | Status: DC

## 2015-10-03 MED ORDER — HEPARIN (PORCINE) IN NACL 2-0.9 UNIT/ML-% IJ SOLN
INTRAMUSCULAR | Status: AC
  Filled 2015-10-03: qty 1000

## 2015-10-03 MED ORDER — NITROGLYCERIN 0.4 MG SL SUBL: 0.4000 mg | SUBLINGUAL_TABLET | SUBLINGUAL | Status: DC | PRN

## 2015-10-03 MED ORDER — ONDANSETRON HCL 4 MG/2ML IJ SOLN
INTRAMUSCULAR | Status: AC
  Administered 2015-10-03: 4 mg via INTRAVENOUS
  Filled 2015-10-03: qty 2

## 2015-10-03 MED ORDER — SODIUM CHLORIDE 0.9% FLUSH: 3.0000 mL | INTRAVENOUS | Status: DC | PRN

## 2015-10-03 MED ORDER — LISINOPRIL 5 MG PO TABS
5.0000 mg | ORAL_TABLET | Freq: Every day | ORAL | Status: DC
Start: 2015-10-04 — End: 2015-10-09
  Administered 2015-10-04 – 2015-10-09 (×6): 5 mg via ORAL
  Filled 2015-10-03 (×7): qty 1

## 2015-10-03 MED ORDER — ATORVASTATIN CALCIUM 20 MG PO TABS
20.0000 mg | ORAL_TABLET | Freq: Every day | ORAL | Status: DC
  Administered 2015-10-03 – 2015-10-08 (×6): 20 mg via ORAL
  Filled 2015-10-03 (×7): qty 1

## 2015-10-03 MED ORDER — ONDANSETRON HCL 4 MG/2ML IJ SOLN
4.0000 mg | Freq: Once | INTRAMUSCULAR | Status: AC
  Administered 2015-10-03: 4 mg via INTRAVENOUS

## 2015-10-03 MED ORDER — DIPHENHYDRAMINE HCL 50 MG/ML IJ SOLN
INTRAMUSCULAR | Status: AC
  Filled 2015-10-03: qty 1

## 2015-10-03 MED ORDER — ACETAMINOPHEN 325 MG PO TABS
650.0000 mg | ORAL_TABLET | ORAL | Status: DC | PRN
  Administered 2015-10-03 – 2015-10-09 (×18): 650 mg via ORAL
  Filled 2015-10-03 (×20): qty 2

## 2015-10-03 MED ORDER — HEPARIN SODIUM (PORCINE) 1000 UNIT/ML IJ SOLN
INTRAMUSCULAR | Status: AC
  Filled 2015-10-03: qty 1

## 2015-10-03 MED ORDER — HEPARIN SODIUM (PORCINE) 1000 UNIT/ML IJ SOLN
INTRAMUSCULAR | Status: DC | PRN
  Administered 2015-10-03: 5000 [IU] via INTRAVENOUS

## 2015-10-03 MED ORDER — POTASSIUM CHLORIDE CRYS ER 20 MEQ PO TBCR
20.0000 meq | EXTENDED_RELEASE_TABLET | Freq: Two times a day (BID) | ORAL | Status: DC
  Administered 2015-10-03 – 2015-10-06 (×7): 20 meq via ORAL
  Filled 2015-10-03 (×9): qty 1

## 2015-10-03 MED ORDER — SODIUM CHLORIDE 0.9% FLUSH
3.0000 mL | Freq: Two times a day (BID) | INTRAVENOUS | Status: DC
  Administered 2015-10-03 – 2015-10-04 (×2): 3 mL via INTRAVENOUS

## 2015-10-03 MED ORDER — ASPIRIN 300 MG RE SUPP: 300.0000 mg | RECTAL | Status: AC

## 2015-10-03 MED ORDER — HEPARIN SODIUM (PORCINE) 5000 UNIT/ML IJ SOLN: 5000.0000 [IU] | Freq: Three times a day (TID) | INTRAMUSCULAR | Status: DC

## 2015-10-03 MED ORDER — VERAPAMIL HCL 2.5 MG/ML IV SOLN
INTRAVENOUS | Status: AC
  Filled 2015-10-03: qty 2

## 2015-10-03 MED ORDER — THYROID 30 MG PO TABS
15.0000 mg | ORAL_TABLET | Freq: Every day | ORAL | Status: DC
  Administered 2015-10-04 – 2015-10-05 (×2): 15 mg via ORAL
  Filled 2015-10-03 (×2): qty 1

## 2015-10-03 MED ORDER — CARVEDILOL 12.5 MG PO TABS
12.5000 mg | ORAL_TABLET | Freq: Two times a day (BID) | ORAL | Status: DC
  Administered 2015-10-03 – 2015-10-07 (×9): 12.5 mg via ORAL
  Filled 2015-10-03 (×10): qty 1

## 2015-10-03 MED ORDER — ASPIRIN 81 MG PO CHEW
324.0000 mg | CHEWABLE_TABLET | Freq: Once | ORAL | Status: AC
  Administered 2015-10-03: 324 mg via ORAL
  Filled 2015-10-03 (×2): qty 4

## 2015-10-03 MED ORDER — ASPIRIN EC 81 MG PO TBEC: 81.0000 mg | DELAYED_RELEASE_TABLET | Freq: Every day | ORAL | Status: DC

## 2015-10-03 MED ORDER — LIDOCAINE HCL (PF) 1 % IJ SOLN
INTRAMUSCULAR | Status: AC
  Filled 2015-10-03: qty 30

## 2015-10-03 MED ORDER — FUROSEMIDE 10 MG/ML IJ SOLN
40.0000 mg | Freq: Two times a day (BID) | INTRAMUSCULAR | Status: DC
  Administered 2015-10-03 – 2015-10-05 (×4): 40 mg via INTRAVENOUS
  Filled 2015-10-03 (×4): qty 4

## 2015-10-03 MED ORDER — ENOXAPARIN SODIUM 40 MG/0.4ML ~~LOC~~ SOLN
40.0000 mg | SUBCUTANEOUS | Status: DC
  Administered 2015-10-04 – 2015-10-06 (×3): 40 mg via SUBCUTANEOUS
  Filled 2015-10-03 (×3): qty 0.4

## 2015-10-03 MED ORDER — ASPIRIN 81 MG PO CHEW: 324.0000 mg | CHEWABLE_TABLET | ORAL | Status: AC

## 2015-10-03 MED ORDER — SODIUM CHLORIDE 0.9% FLUSH
3.0000 mL | Freq: Two times a day (BID) | INTRAVENOUS | Status: DC
  Administered 2015-10-03 – 2015-10-05 (×4): 3 mL via INTRAVENOUS

## 2015-10-03 MED ORDER — ASPIRIN EC 81 MG PO TBEC
81.0000 mg | DELAYED_RELEASE_TABLET | Freq: Every day | ORAL | Status: DC
  Administered 2015-10-04 – 2015-10-09 (×5): 81 mg via ORAL
  Filled 2015-10-03 (×5): qty 1

## 2015-10-03 MED ORDER — SODIUM CHLORIDE 0.9 % IV BOLUS (SEPSIS)
1000.0000 mL | Freq: Once | INTRAVENOUS | Status: AC
  Administered 2015-10-03: 1000 mL via INTRAVENOUS

## 2015-10-03 MED ORDER — LIDOCAINE HCL (PF) 1 % IJ SOLN
INTRAMUSCULAR | Status: DC | PRN
  Administered 2015-10-03: 3 mL

## 2015-10-03 MED ORDER — SODIUM CHLORIDE 0.9% FLUSH
3.0000 mL | Freq: Two times a day (BID) | INTRAVENOUS | Status: DC
  Administered 2015-10-03 – 2015-10-04 (×3): 3 mL via INTRAVENOUS

## 2015-10-03 MED ORDER — HEPARIN (PORCINE) IN NACL 2-0.9 UNIT/ML-% IJ SOLN
INTRAMUSCULAR | Status: DC | PRN
  Administered 2015-10-03: 19:00:00 via INTRA_ARTERIAL

## 2015-10-03 MED ORDER — COENZYME Q10 30 MG PO CAPS: 30.0000 mg | ORAL_CAPSULE | Freq: Every day | ORAL | Status: DC

## 2015-10-03 MED ORDER — FAMOTIDINE 20 MG PO TABS: 20.0000 mg | ORAL_TABLET | Freq: Every day | ORAL | Status: DC | PRN

## 2015-10-03 SURGICAL SUPPLY — 13 items
CATH INFINITI 5 FR JL3.5 (CATHETERS) ×1 IMPLANT
CATH INFINITI 5FR ANG PIGTAIL (CATHETERS) ×3 IMPLANT
CATH INFINITI JR4 5F (CATHETERS) ×1 IMPLANT
DEVICE RAD COMP TR BAND LRG (VASCULAR PRODUCTS) ×1 IMPLANT
GLIDESHEATH SLEND SS 6F .021 (SHEATH) ×2 IMPLANT
HOVERMATT SINGLE USE (MISCELLANEOUS) ×1 IMPLANT
KIT HEART LEFT (KITS) ×2 IMPLANT
PACK CARDIAC CATHETERIZATION (CUSTOM PROCEDURE TRAY) ×2 IMPLANT
SYR MEDRAD MARK V 150ML (SYRINGE) ×2 IMPLANT
TRANSDUCER W/STOPCOCK (MISCELLANEOUS) ×2 IMPLANT
TUBING CIL FLEX 10 FLL-RA (TUBING) ×2 IMPLANT
WIRE HI TORQ VERSACORE-J 145CM (WIRE) ×1 IMPLANT
WIRE SAFE-T 1.5MM-J .035X260CM (WIRE) ×1 IMPLANT

## 2015-10-03 NOTE — ED Notes (Signed)
Patient placed on zoll  
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
## 2015-10-03 NOTE — ED Notes (Signed)
MD at bedside. MD updated daughter on what going on.

## 2015-10-03 NOTE — Interval H&P Note (Signed)
History and Physical Interval Note:  10/03/2015 7:50 PM  Lisa [name]  has presented today for surgery, with the diagnosis of cardiac arrest  The various methods of treatment have been discussed with the patient and family. After consideration of risks, benefits and other options for treatment, the patient has consented to  Procedure(s): Left Heart Cath and Coronary Angiography (N/A) as a surgical intervention .  The patient's history has been reviewed, patient examined, no change in status, stable for surgery.  I have reviewed the patient's chart and labs.  Questions were answered to the patient's satisfaction.     [name] 10/03/2015 7:50 PM

## 2015-10-03 NOTE — ED Notes (Signed)
Hematoma noted to the right frontal lobe. Mild swelling and hematoma noted to the right hand. bruse noted to the right arm. Patient also has Bilateral leg swelling.

## 2015-10-03 NOTE — ED Notes (Signed)
Daughter at bedside.
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
## 2015-10-03 NOTE — ED Notes (Signed)
Patient continues to states, " I don't remember anything" " my chest hurts"

## 2015-10-04 ENCOUNTER — Observation Stay (HOSPITAL_COMMUNITY): Payer: Medicare Other

## 2015-10-04 ENCOUNTER — Encounter (HOSPITAL_COMMUNITY): Payer: Self-pay

## 2015-10-04 DIAGNOSIS — I493 Ventricular premature depolarization: Secondary | ICD-10-CM | POA: Diagnosis not present

## 2015-10-04 DIAGNOSIS — E872 Acidosis: Secondary | ICD-10-CM | POA: Diagnosis not present

## 2015-10-04 DIAGNOSIS — I25110 Atherosclerotic heart disease of native coronary artery with unstable angina pectoris: Secondary | ICD-10-CM | POA: Diagnosis present

## 2015-10-04 DIAGNOSIS — I469 Cardiac arrest, cause unspecified: Secondary | ICD-10-CM | POA: Diagnosis present

## 2015-10-04 DIAGNOSIS — I25111 Atherosclerotic heart disease of native coronary artery with angina pectoris with documented spasm: Secondary | ICD-10-CM | POA: Diagnosis not present

## 2015-10-04 DIAGNOSIS — I4901 Ventricular fibrillation: Secondary | ICD-10-CM | POA: Diagnosis not present

## 2015-10-04 DIAGNOSIS — I10 Essential (primary) hypertension: Secondary | ICD-10-CM | POA: Diagnosis present

## 2015-10-04 DIAGNOSIS — Z90710 Acquired absence of both cervix and uterus: Secondary | ICD-10-CM | POA: Diagnosis not present

## 2015-10-04 DIAGNOSIS — I2510 Atherosclerotic heart disease of native coronary artery without angina pectoris: Secondary | ICD-10-CM | POA: Diagnosis not present

## 2015-10-04 DIAGNOSIS — Z6841 Body Mass Index (BMI) 40.0 and over, adult: Secondary | ICD-10-CM | POA: Diagnosis not present

## 2015-10-04 DIAGNOSIS — Z881 Allergy status to other antibiotic agents status: Secondary | ICD-10-CM | POA: Diagnosis not present

## 2015-10-04 DIAGNOSIS — E039 Hypothyroidism, unspecified: Secondary | ICD-10-CM | POA: Diagnosis present

## 2015-10-04 DIAGNOSIS — Y92219 Unspecified school as the place of occurrence of the external cause: Secondary | ICD-10-CM | POA: Diagnosis not present

## 2015-10-04 DIAGNOSIS — I420 Dilated cardiomyopathy: Secondary | ICD-10-CM | POA: Diagnosis not present

## 2015-10-04 DIAGNOSIS — R7303 Prediabetes: Secondary | ICD-10-CM | POA: Diagnosis present

## 2015-10-04 DIAGNOSIS — I5022 Chronic systolic (congestive) heart failure: Secondary | ICD-10-CM | POA: Diagnosis not present

## 2015-10-04 DIAGNOSIS — Z79899 Other long term (current) drug therapy: Secondary | ICD-10-CM | POA: Diagnosis not present

## 2015-10-04 DIAGNOSIS — E785 Hyperlipidemia, unspecified: Secondary | ICD-10-CM | POA: Diagnosis present

## 2015-10-04 DIAGNOSIS — I462 Cardiac arrest due to underlying cardiac condition: Secondary | ICD-10-CM | POA: Diagnosis present

## 2015-10-04 DIAGNOSIS — Z880 Allergy status to penicillin: Secondary | ICD-10-CM | POA: Diagnosis not present

## 2015-10-04 DIAGNOSIS — Z7982 Long term (current) use of aspirin: Secondary | ICD-10-CM | POA: Diagnosis not present

## 2015-10-04 DIAGNOSIS — I509 Heart failure, unspecified: Secondary | ICD-10-CM | POA: Diagnosis not present

## 2015-10-04 DIAGNOSIS — Z8249 Family history of ischemic heart disease and other diseases of the circulatory system: Secondary | ICD-10-CM | POA: Diagnosis not present

## 2015-10-04 DIAGNOSIS — I5042 Chronic combined systolic (congestive) and diastolic (congestive) heart failure: Secondary | ICD-10-CM | POA: Diagnosis not present

## 2015-10-04 LAB — LIPID PANEL
Cholesterol: 105 mg/dL (ref 0–200)
HDL: 25 mg/dL — ABNORMAL LOW (ref >40)
LDL Cholesterol: 62 mg/dL (ref 0–99)
Total CHOL/HDL Ratio: 4.2 RATIO
Triglycerides: 91 mg/dL (ref <150)
VLDL: 18 mg/dL (ref 0–40)

## 2015-10-04 LAB — TROPONIN I
Troponin I: 0.35 ng/mL — ABNORMAL HIGH (ref <0.031)
Troponin I: 0.4 ng/mL — ABNORMAL HIGH (ref <0.031)
Troponin I: 0.48 ng/mL — ABNORMAL HIGH (ref <0.031)

## 2015-10-04 LAB — CBC
HCT: 31.3 % — ABNORMAL LOW (ref 36.0–46.0)
Hemoglobin: 10.1 g/dL — ABNORMAL LOW (ref 12.0–15.0)
MCH: 27.8 pg (ref 26.0–34.0)
MCHC: 32.3 g/dL (ref 30.0–36.0)
MCV: 86.2 fL (ref 78.0–100.0)
Platelets: 212 10*3/uL (ref 150–400)
RBC: 3.63 MIL/uL — ABNORMAL LOW (ref 3.87–5.11)
RDW: 15.1 % (ref 11.5–15.5)
WBC: 8.9 10*3/uL (ref 4.0–10.5)

## 2015-10-04 LAB — PROTIME-INR
INR: 1.23 (ref 0.00–1.49)
Prothrombin Time: 15.6 seconds — ABNORMAL HIGH (ref 11.6–15.2)

## 2015-10-04 LAB — COMPREHENSIVE METABOLIC PANEL
ALT: 214 U/L — ABNORMAL HIGH (ref 14–54)
AST: 196 U/L — ABNORMAL HIGH (ref 15–41)
Albumin: 2.8 g/dL — ABNORMAL LOW (ref 3.5–5.0)
Alkaline Phosphatase: 63 U/L (ref 38–126)
Anion gap: 10 (ref 5–15)
BUN: 13 mg/dL (ref 6–20)
CO2: 22 mmol/L (ref 22–32)
Calcium: 8.4 mg/dL — ABNORMAL LOW (ref 8.9–10.3)
Chloride: 104 mmol/L (ref 101–111)
Creatinine, Ser: 0.94 mg/dL (ref 0.44–1.00)
GFR calc Af Amer: >60 mL/min (ref >60)
GFR calc non Af Amer: 59 mL/min — ABNORMAL LOW (ref >60)
Glucose, Bld: 143 mg/dL — ABNORMAL HIGH (ref 65–99)
Potassium: 4.4 mmol/L (ref 3.5–5.1)
Sodium: 136 mmol/L (ref 135–145)
Total Bilirubin: 1.2 mg/dL (ref 0.3–1.2)
Total Protein: 5.5 g/dL — ABNORMAL LOW (ref 6.5–8.1)

## 2015-10-04 LAB — TSH: TSH: 0.959 u[IU]/mL (ref 0.350–4.500)

## 2015-10-04 LAB — MAGNESIUM: Magnesium: 1.7 mg/dL (ref 1.7–2.4)

## 2015-10-04 LAB — LACTIC ACID, PLASMA
Lactic Acid, Venous: 1.2 mmol/L (ref 0.5–2.0)
Lactic Acid, Venous: 1.3 mmol/L (ref 0.5–2.0)

## 2015-10-04 MED ORDER — SODIUM CHLORIDE 0.9 % IV SOLN: 250.0000 mL | INTRAVENOUS | Status: DC | PRN

## 2015-10-04 MED ORDER — SODIUM CHLORIDE 0.9% FLUSH
3.0000 mL | Freq: Two times a day (BID) | INTRAVENOUS | Status: DC
  Administered 2015-10-04 – 2015-10-05 (×2): 3 mL via INTRAVENOUS

## 2015-10-04 MED ORDER — SODIUM CHLORIDE 0.9 % WEIGHT BASED INFUSION: 1.0000 mL/kg/h | INTRAVENOUS | Status: DC

## 2015-10-04 MED ORDER — ASPIRIN 81 MG PO CHEW: 81.0000 mg | CHEWABLE_TABLET | ORAL | Status: DC

## 2015-10-04 MED ORDER — SODIUM CHLORIDE 0.9% FLUSH: 3.0000 mL | INTRAVENOUS | Status: DC | PRN

## 2015-10-04 MED ORDER — SODIUM CHLORIDE 0.9 % WEIGHT BASED INFUSION: 3.0000 mL/kg/h | INTRAVENOUS | Status: DC

## 2015-10-04 MED FILL — Diphenhydramine HCl Inj 50 MG/ML: INTRAMUSCULAR | Qty: 1 | Status: AC
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
## 2015-10-04 NOTE — Progress Notes (Signed)
*  PRELIMINARY RESULTS* Echocardiogram 2D Echocardiogram has been performed.  Leavy Cella 10/04/2015, 11:45 AM
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
## 2015-10-05 DIAGNOSIS — I493 Ventricular premature depolarization: Secondary | ICD-10-CM

## 2015-10-05 DIAGNOSIS — I5023 Acute on chronic systolic (congestive) heart failure: Secondary | ICD-10-CM | POA: Insufficient documentation

## 2015-10-05 DIAGNOSIS — I5022 Chronic systolic (congestive) heart failure: Secondary | ICD-10-CM

## 2015-10-05 LAB — HEMOGLOBIN A1C
Hgb A1c MFr Bld: 6.2 % — ABNORMAL HIGH (ref 4.8–5.6)
Mean Plasma Glucose: 131 mg/dL

## 2015-10-05 MED ORDER — SPIRONOLACTONE 12.5 MG HALF TABLET
12.5000 mg | ORAL_TABLET | Freq: Every day | ORAL | Status: DC
  Administered 2015-10-05 – 2015-10-09 (×5): 12.5 mg via ORAL
  Filled 2015-10-05 (×5): qty 1

## 2015-10-05 MED ORDER — FUROSEMIDE 40 MG PO TABS
40.0000 mg | ORAL_TABLET | Freq: Every day | ORAL | Status: DC
  Administered 2015-10-06 – 2015-10-08 (×2): 40 mg via ORAL
  Filled 2015-10-05 (×3): qty 1
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
## 2015-10-06 LAB — BASIC METABOLIC PANEL
Anion gap: 11 (ref 5–15)
BUN: 12 mg/dL (ref 6–20)
CO2: 22 mmol/L (ref 22–32)
Calcium: 8.5 mg/dL — ABNORMAL LOW (ref 8.9–10.3)
Chloride: 105 mmol/L (ref 101–111)
Creatinine, Ser: 0.96 mg/dL (ref 0.44–1.00)
GFR calc Af Amer: >60 mL/min (ref >60)
GFR calc non Af Amer: 57 mL/min — ABNORMAL LOW (ref >60)
Glucose, Bld: 124 mg/dL — ABNORMAL HIGH (ref 65–99)
Potassium: 4.7 mmol/L (ref 3.5–5.1)
Sodium: 138 mmol/L (ref 135–145)

## 2015-10-06 LAB — MAGNESIUM: Magnesium: 1.8 mg/dL (ref 1.7–2.4)

## 2015-10-06 MED ORDER — THYROID 30 MG PO TABS
15.0000 mg | ORAL_TABLET | Freq: Every day | ORAL | Status: DC
  Administered 2015-10-06 – 2015-10-08 (×3): 15 mg via ORAL
  Filled 2015-10-06 (×3): qty 1

## 2015-10-06 MED ORDER — SODIUM CHLORIDE 0.9 % IV SOLN
250.0000 mL | INTRAVENOUS | Status: DC | PRN
Start: 2015-10-06 — End: 2015-10-07

## 2015-10-06 MED ORDER — SODIUM CHLORIDE 0.9% FLUSH
3.0000 mL | Freq: Two times a day (BID) | INTRAVENOUS | Status: DC
  Administered 2015-10-06 – 2015-10-07 (×2): 3 mL via INTRAVENOUS

## 2015-10-06 MED ORDER — ASPIRIN 81 MG PO CHEW
81.0000 mg | CHEWABLE_TABLET | ORAL | Status: AC
  Administered 2015-10-07: 81 mg via ORAL
  Filled 2015-10-06: qty 1

## 2015-10-06 MED ORDER — SODIUM CHLORIDE 0.9% FLUSH: 3.0000 mL | INTRAVENOUS | Status: DC | PRN

## 2015-10-06 MED ORDER — SODIUM CHLORIDE 0.9 % IV SOLN
INTRAVENOUS | Status: DC
  Administered 2015-10-07: 06:00:00 via INTRAVENOUS

## 2015-10-06 NOTE — Progress Notes (Signed)
Nursing. Pt having some ectopy. MD notified. Transfer order canceled. BP 116/58. HR increased to 150. 80 currently. Pt stated she could feel her heart beating faster. Will continue to monitor. Pt to cath for planned PCI tomorrow and d/c home with life-vest due to recent arrest.
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
## 2015-10-07 ENCOUNTER — Encounter (HOSPITAL_COMMUNITY)
Admission: EM | Disposition: A | Discharge status: Home Health | Payer: Self-pay | Source: Home / Self Care | Attending: Cardiovascular Disease

## 2015-10-07 ENCOUNTER — Encounter (HOSPITAL_COMMUNITY): Payer: Self-pay | Admitting: Cardiovascular Disease

## 2015-10-07 DIAGNOSIS — I200 Unstable angina: Secondary | ICD-10-CM | POA: Diagnosis present

## 2015-10-07 HISTORY — PX: CARDIAC CATHETERIZATION: SHX172

## 2015-10-07 LAB — BASIC METABOLIC PANEL
Anion gap: 12 (ref 5–15)
BUN: 13 mg/dL (ref 6–20)
CO2: 25 mmol/L (ref 22–32)
Calcium: 9.2 mg/dL (ref 8.9–10.3)
Chloride: 102 mmol/L (ref 101–111)
Creatinine, Ser: 1.04 mg/dL — ABNORMAL HIGH (ref 0.44–1.00)
GFR calc Af Amer: >60 mL/min (ref >60)
GFR calc non Af Amer: 52 mL/min — ABNORMAL LOW (ref >60)
Glucose, Bld: 123 mg/dL — ABNORMAL HIGH (ref 65–99)
Potassium: 4.7 mmol/L (ref 3.5–5.1)
Sodium: 139 mmol/L (ref 135–145)

## 2015-10-07 LAB — POCT ACTIVATED CLOTTING TIME: Activated Clotting Time: 574 seconds

## 2015-10-07 SURGERY — CORONARY STENT INTERVENTION
Anesthesia: LOCAL

## 2015-10-07 MED ORDER — MIDAZOLAM HCL 2 MG/2ML IJ SOLN
INTRAMUSCULAR | Status: AC
  Filled 2015-10-07: qty 2

## 2015-10-07 MED ORDER — TICAGRELOR 90 MG PO TABS
180.0000 mg | ORAL_TABLET | Freq: Once | ORAL | Status: AC
  Administered 2015-10-07: 180 mg via ORAL
  Filled 2015-10-07: qty 2

## 2015-10-07 MED ORDER — ANGIOPLASTY BOOK
Freq: Once | Status: AC
  Administered 2015-10-07: 21:00:00
  Filled 2015-10-07: qty 1

## 2015-10-07 MED ORDER — HEPARIN SODIUM (PORCINE) 1000 UNIT/ML IJ SOLN
INTRAMUSCULAR | Status: AC
  Filled 2015-10-07: qty 1

## 2015-10-07 MED ORDER — VERAPAMIL HCL 2.5 MG/ML IV SOLN
INTRAVENOUS | Status: DC | PRN
  Administered 2015-10-07: 13:00:00 via INTRA_ARTERIAL

## 2015-10-07 MED ORDER — MIDAZOLAM HCL 2 MG/2ML IJ SOLN
INTRAMUSCULAR | Status: DC | PRN
  Administered 2015-10-07: 1 mg via INTRAVENOUS
  Administered 2015-10-07: 2 mg via INTRAVENOUS

## 2015-10-07 MED ORDER — TICAGRELOR 90 MG PO TABS
90.0000 mg | ORAL_TABLET | Freq: Two times a day (BID) | ORAL | Status: DC
  Administered 2015-10-07 – 2015-10-09 (×4): 90 mg via ORAL
  Filled 2015-10-07 (×4): qty 1

## 2015-10-07 MED ORDER — LIDOCAINE HCL (PF) 1 % IJ SOLN
INTRAMUSCULAR | Status: DC | PRN
  Administered 2015-10-07: 2 mL via SUBCUTANEOUS

## 2015-10-07 MED ORDER — SODIUM CHLORIDE 0.9 % IV SOLN: 250.0000 mL | INTRAVENOUS | Status: DC | PRN

## 2015-10-07 MED ORDER — SODIUM CHLORIDE 0.9 % IV SOLN: INTRAVENOUS | Status: AC

## 2015-10-07 MED ORDER — HEPARIN (PORCINE) IN NACL 2-0.9 UNIT/ML-% IJ SOLN
INTRAMUSCULAR | Status: DC | PRN
  Administered 2015-10-07: 1000 mL

## 2015-10-07 MED ORDER — LIDOCAINE HCL (PF) 1 % IJ SOLN
INTRAMUSCULAR | Status: AC
  Filled 2015-10-07: qty 30

## 2015-10-07 MED ORDER — FENTANYL CITRATE (PF) 100 MCG/2ML IJ SOLN
INTRAMUSCULAR | Status: DC | PRN
  Administered 2015-10-07 (×2): 25 ug via INTRAVENOUS

## 2015-10-07 MED ORDER — IOHEXOL 350 MG/ML SOLN
INTRAVENOUS | Status: DC | PRN
  Administered 2015-10-07: 165 mL via INTRA_ARTERIAL

## 2015-10-07 MED ORDER — SODIUM CHLORIDE 0.9% FLUSH: 3.0000 mL | INTRAVENOUS | Status: DC | PRN

## 2015-10-07 MED ORDER — SODIUM CHLORIDE 0.9% FLUSH: 3.0000 mL | Freq: Two times a day (BID) | INTRAVENOUS | Status: DC

## 2015-10-07 MED ORDER — HEPARIN (PORCINE) IN NACL 2-0.9 UNIT/ML-% IJ SOLN
INTRAMUSCULAR | Status: AC
  Filled 2015-10-07: qty 500

## 2015-10-07 MED ORDER — FENTANYL CITRATE (PF) 100 MCG/2ML IJ SOLN
INTRAMUSCULAR | Status: AC
  Filled 2015-10-07: qty 2

## 2015-10-07 MED ORDER — VERAPAMIL HCL 2.5 MG/ML IV SOLN
INTRAVENOUS | Status: AC
  Filled 2015-10-07: qty 2

## 2015-10-07 MED ORDER — HEPARIN SODIUM (PORCINE) 1000 UNIT/ML IJ SOLN
INTRAMUSCULAR | Status: DC | PRN
  Administered 2015-10-07: 10000 [IU] via INTRAVENOUS

## 2015-10-07 SURGICAL SUPPLY — 18 items
BALLN EMERGE MR 2.0X15 (BALLOONS) ×2
BALLN ~~LOC~~ EMERGE MR 2.5X15 (BALLOONS) ×2
BALLOON EMERGE MR 2.0X15 (BALLOONS) IMPLANT
BALLOON ~~LOC~~ EMERGE MR 2.5X15 (BALLOONS) IMPLANT
BRACE RADIAL COMPRESSION RADST (HEMOSTASIS) ×1 IMPLANT
CATH VISTA GUIDE 6FR XBLAD3.5 (CATHETERS) ×1 IMPLANT
DEVICE RAD COMP TR BAND LRG (VASCULAR PRODUCTS) ×1 IMPLANT
GLIDESHEATH SLEND SS 6F .021 (SHEATH) ×1 IMPLANT
HOVERMATT SINGLE USE (MISCELLANEOUS) ×1 IMPLANT
KIT ENCORE 26 ADVANTAGE (KITS) ×3 IMPLANT
KIT HEART LEFT (KITS) ×2 IMPLANT
PACK CARDIAC CATHETERIZATION (CUSTOM PROCEDURE TRAY) ×2 IMPLANT
STENT PROMUS PREM MR 2.25X20 (Permanent Stent) ×1 IMPLANT
TRANSDUCER W/STOPCOCK (MISCELLANEOUS) ×2 IMPLANT
TUBING CIL FLEX 10 FLL-RA (TUBING) ×2 IMPLANT
WIRE COUGAR XT STRL 190CM (WIRE) ×1 IMPLANT
WIRE HI TORQ VERSACORE-J 145CM (WIRE) ×1 IMPLANT
WIRE SAFE-T 1.5MM-J .035X260CM (WIRE) ×1 IMPLANT

## 2015-10-07 NOTE — Care Management Important Message (Signed)
Important Message  Patient Details  Name: Keyia Shayne MRN: [id number] Date of Birth: 11-03-1940   Medicare Important Message Given:  Yes    Barb Merino Eiliana Drone 10/07/2015, 4:36 PM

## 2015-10-07 NOTE — Interval H&P Note (Signed)
History and Physical Interval Note:  10/07/2015 12:04 PM  Lisa [name]  has presented today for cardiac cath/PCI with the diagnosis of CAD, cardiac arrest.  The various methods of treatment have been discussed with the patient and family. After consideration of risks, benefits and other options for treatment, the patient has consented to  Procedure(s): Coronary Stent Intervention (N/A) as a surgical intervention .  The patient's history has been reviewed, patient examined, no change in status, stable for surgery.  I have reviewed the patient's chart and labs.  Questions were answered to the patient's satisfaction.    Cath Lab Visit (complete for each Cath Lab visit)  Clinical Evaluation Leading to the Procedure:   ACS: No.  Non-ACS:    Anginal Classification: CCS III  Anti-ischemic medical therapy: Minimal Therapy (1 class of medications)  Non-Invasive Test Results: No non-invasive testing performed  Prior CABG: No previous CABG         Makensie Mulhall

## 2015-10-07 NOTE — H&P (Signed)
    SUBJECTIVE:  Chest is sore.  Tachycardiac last night appears to be SVT   PHYSICAL EXAM Filed Vitals:   10/07/15 0200 10/07/15 0400 10/07/15 0500 10/07/15 0700  BP:  136/69    Pulse: 65 73  66  Temp:  98.5 F (36.9 C)    TempSrc:  Oral    Resp:  16    Height:      Weight:   255 lb 1.6 oz (115.713 kg)   SpO2:  98%  95%   General:  No acute distress Lungs:  Clear Heart:  RRR Abdomen:  Positive bowel sounds, no rebound no guarding Extremities:  Lympedema Neuro:  Nonfocal  LABS:  Results for orders placed or performed during the hospital encounter of 10/03/15 (from the past 24 hour(s))  Basic metabolic panel     Status: Abnormal   Collection Time: 10/07/15  2:35 AM  Result Value Ref Range   Sodium 139 135 - 145 mmol/L   Potassium 4.7 3.5 - 5.1 mmol/L   Chloride 102 101 - 111 mmol/L   CO2 25 22 - 32 mmol/L   Glucose, Bld 123 (H) 65 - 99 mg/dL   BUN 13 6 - 20 mg/dL   Creatinine, Ser 1.04 (H) 0.44 - 1.00 mg/dL   Calcium 9.2 8.9 - 10.3 mg/dL   GFR calc non Af Amer 52 (L) >60 mL/min   GFR calc Af Amer >60 >60 mL/min   Anion gap 12 5 - 15    Intake/Output Summary (Last 24 hours) at 10/07/15 [id number] Last data filed at 10/07/15 0700  Gross per 24 hour  Intake 888.83 ml  Output   4000 ml  Net -3111.17 ml     ASSESSMENT AND PLAN:  CARDIAC ARREST:  Presumed VF.   EF 25 - 30%.  We will need to have further discussion about ICD vs Life Vest prior to discharge.  I will discuss further with EP.   HTN:    Being treated in the context of treating her HF  CHRONIC COMBINED SYSTOLIC AND DIASTOLIC HF:   Seems to be euvolemic.    CAD:  LAD 80%.   Plan for PCI today.      Jeneen Rinks [facility] 10/07/2015 8:08 AM

## 2015-10-07 NOTE — Progress Notes (Signed)
   10/07/15 1000  Clinical Encounter Type  Visited With Patient  Visit Type (Complete AD)  Referral From Patient;Nurse  Spiritual Encounters  Spiritual Needs Literature;Emotional  CH confirmed Adv, Directive, asked witnesses and notarized Advanced Directive for pt; original given to admin to copy and put into file. Gwynn Burly

## 2015-10-08 DIAGNOSIS — N390 Urinary tract infection, site not specified: Secondary | ICD-10-CM

## 2015-10-08 LAB — BASIC METABOLIC PANEL
Anion gap: 9 (ref 5–15)
Anion gap: 14 (ref 5–15)
BUN: 12 mg/dL (ref 6–20)
BUN: 16 mg/dL (ref 6–20)
CO2: 23 mmol/L (ref 22–32)
CO2: 23 mmol/L (ref 22–32)
Calcium: 9.1 mg/dL (ref 8.9–10.3)
Calcium: 9.2 mg/dL (ref 8.9–10.3)
Chloride: 105 mmol/L (ref 101–111)
Chloride: 100 mmol/L — ABNORMAL LOW (ref 101–111)
Creatinine, Ser: 1.08 mg/dL — ABNORMAL HIGH (ref 0.44–1.00)
Creatinine, Ser: 1.2 mg/dL — ABNORMAL HIGH (ref 0.44–1.00)
GFR calc Af Amer: 58 mL/min — ABNORMAL LOW (ref >60)
GFR calc Af Amer: 51 mL/min — ABNORMAL LOW (ref >60)
GFR calc non Af Amer: 50 mL/min — ABNORMAL LOW (ref >60)
GFR calc non Af Amer: 44 mL/min — ABNORMAL LOW (ref >60)
Glucose, Bld: 130 mg/dL — ABNORMAL HIGH (ref 65–99)
Glucose, Bld: 131 mg/dL — ABNORMAL HIGH (ref 65–99)
Potassium: 4.6 mmol/L (ref 3.5–5.1)
Potassium: 4 mmol/L (ref 3.5–5.1)
Sodium: 137 mmol/L (ref 135–145)
Sodium: 137 mmol/L (ref 135–145)

## 2015-10-08 LAB — CBC
HCT: 33.8 % — ABNORMAL LOW (ref 36.0–46.0)
Hemoglobin: 11 g/dL — ABNORMAL LOW (ref 12.0–15.0)
MCH: 27.8 pg (ref 26.0–34.0)
MCHC: 32.5 g/dL (ref 30.0–36.0)
MCV: 85.4 fL (ref 78.0–100.0)
Platelets: 244 10*3/uL (ref 150–400)
RBC: 3.96 MIL/uL (ref 3.87–5.11)
RDW: 14.9 % (ref 11.5–15.5)
WBC: 12.2 10*3/uL — ABNORMAL HIGH (ref 4.0–10.5)

## 2015-10-08 LAB — URINALYSIS, ROUTINE W REFLEX MICROSCOPIC
Bilirubin Urine: NEGATIVE
Glucose, UA: NEGATIVE mg/dL
Ketones, ur: NEGATIVE mg/dL
Nitrite: POSITIVE — AB
Protein, ur: NEGATIVE mg/dL
Specific Gravity, Urine: 1.013 (ref 1.005–1.030)
pH: 5.5 (ref 5.0–8.0)

## 2015-10-08 LAB — URINE MICROSCOPIC-ADD ON

## 2015-10-08 MED ORDER — CIPROFLOXACIN HCL 500 MG PO TABS
500.0000 mg | ORAL_TABLET | Freq: Two times a day (BID) | ORAL | Status: DC
  Administered 2015-10-08 – 2015-10-09 (×2): 500 mg via ORAL
  Filled 2015-10-08 (×2): qty 1

## 2015-10-08 MED ORDER — THYROID 30 MG PO TABS
15.0000 mg | ORAL_TABLET | Freq: Every day | ORAL | Status: DC
  Administered 2015-10-09: 15 mg via ORAL
  Filled 2015-10-08: qty 1

## 2015-10-08 MED ORDER — CARVEDILOL 3.125 MG PO TABS
18.7500 mg | ORAL_TABLET | Freq: Two times a day (BID) | ORAL | Status: DC
  Administered 2015-10-08 – 2015-10-09 (×3): 18.75 mg via ORAL
  Filled 2015-10-08 (×3): qty 2

## 2015-10-08 NOTE — Care Management Note (Signed)
Case Management Note  Patient Details  Name: Lisa [name] MRN: [id number] Date of Birth: 12-10-1940  Subjective/Objective:   Per pt eval rec hhpt, patient chose [facility], referral made to [facility] with [facility] for HHPT, Soc will begin 24-48 hrs post dc.  NCM informed patient of her deductible of $400 has not been met yet, but she has the 30 day trial savings card as well, maybe by the time she gets her refill the deductible will be processed as being met and she would only pay 40% of the cost of Brilinta.  Patient was also seen by the [facility] Rep.                   Action/Plan:   Expected Discharge Date:                  Expected Discharge Plan:  [place]  In-House Referral:     Discharge planning Services  CM Consult  Post Acute Care Choice:    Choice offered to:  Patient  DME Arranged:    DME Agency:     HH Arranged:  PT [place]:  [place]  Status of Service:  Completed, signed off  Medicare Important Message Given:  Yes Date Medicare IM Given:    Medicare IM give by:    Date Additional Medicare IM Given:    Additional Medicare Important Message give by:     If discussed at [place] of Stay Meetings, dates discussed:    Additional Comments:  Zenon Mayo, RN 10/08/2015, 12:48 PM
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
## 2015-10-09 ENCOUNTER — Telehealth: Payer: Self-pay | Admitting: Cardiovascular Disease

## 2015-10-09 LAB — BASIC METABOLIC PANEL
Anion gap: 11 (ref 5–15)
BUN: 15 mg/dL (ref 6–20)
CO2: 23 mmol/L (ref 22–32)
Calcium: 8.7 mg/dL — ABNORMAL LOW (ref 8.9–10.3)
Chloride: 103 mmol/L (ref 101–111)
Creatinine, Ser: 1.04 mg/dL — ABNORMAL HIGH (ref 0.44–1.00)
GFR calc Af Amer: >60 mL/min (ref >60)
GFR calc non Af Amer: 52 mL/min — ABNORMAL LOW (ref >60)
Glucose, Bld: 129 mg/dL — ABNORMAL HIGH (ref 65–99)
Potassium: 3.9 mmol/L (ref 3.5–5.1)
Sodium: 137 mmol/L (ref 135–145)

## 2015-10-09 LAB — URINE CULTURE: Culture: >=100000

## 2015-10-09 MED ORDER — CARVEDILOL 12.5 MG PO TABS: 18.7500 mg | ORAL_TABLET | Freq: Two times a day (BID) | ORAL | Status: DC

## 2015-10-09 MED ORDER — CIPROFLOXACIN HCL 500 MG PO TABS: 500.0000 mg | ORAL_TABLET | Freq: Two times a day (BID) | ORAL | Status: DC

## 2015-10-09 MED ORDER — ATORVASTATIN CALCIUM 20 MG PO TABS
20.0000 mg | ORAL_TABLET | Freq: Every day | ORAL | Status: DC
Start: 2015-10-09 — End: 2016-05-07

## 2015-10-09 MED ORDER — FUROSEMIDE 40 MG PO TABS: 40.0000 mg | ORAL_TABLET | Freq: Every day | ORAL | Status: DC

## 2015-10-09 MED ORDER — TICAGRELOR 90 MG PO TABS: 90.0000 mg | ORAL_TABLET | Freq: Two times a day (BID) | ORAL | Status: DC

## 2015-10-09 MED ORDER — LISINOPRIL 5 MG PO TABS: 5.0000 mg | ORAL_TABLET | Freq: Every day | ORAL | Status: DC

## 2015-10-09 MED ORDER — SPIRONOLACTONE 25 MG PO TABS: 12.5000 mg | ORAL_TABLET | Freq: Every day | ORAL | Status: DC

## 2015-10-09 MED ORDER — NITROGLYCERIN 0.4 MG SL SUBL: 0.4000 mg | SUBLINGUAL_TABLET | SUBLINGUAL | Status: DC | PRN
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
## 2015-10-09 NOTE — Care Management Note (Signed)
Case Management Note  Patient Details  Name: Lisa [name] MRN: [id number] Date of Birth: Dec 27, 1940  Subjective/Objective:  Patient is for discharge today, she will have HHPT/OT with [facility]  And 3 n 1,  [name] will bring 3 n 1 up to room before she is discharged.  Patient has the Brinlinta 30 day trial savings card and will go to [facility] to pick up medication which they have in stock.                     Action/Plan:   Expected Discharge Date:                  Expected Discharge Plan:  [place]  In-House Referral:     Discharge planning Services  CM Consult  Post Acute Care Choice:    Choice offered to:  Patient  DME Arranged:  3-N-1 DME Agency:  [place]:  PT, OT [facility] Agency:  [place]  Status of Service:  Completed, signed off  Medicare Important Message Given:  Yes Date Medicare IM Given:    Medicare IM give by:    Date Additional Medicare IM Given:    Additional Medicare Important Message give by:     If discussed at [place] of Stay Meetings, dates discussed:    Additional Comments:  Zenon Mayo, RN 10/09/2015, 12:00 PM

## 2015-10-09 NOTE — Progress Notes (Signed)
Tele mx showed rapid onset SVT rate 140's.  Run to pt room found her sitting in recliner w/ OT therapist in room who states all she did was stand and sit back down.  Back to SR 70's. Pt unaware of increased heart rate, asymptomatic.  Lisa Henri PA informed and advised still ok to d/c home.
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
## 2015-10-09 NOTE — Progress Notes (Signed)
CARDIAC REHAB PHASE I   PRE:  Rate/Rhythm: 79 SR    BP: sitting 137/55    SaO2:   MODE:  Ambulation: 280 ft   POST:  Rate/Rhythm: 111 ST    BP: sitting 146/65     SaO2:   Pt c/o knee and hip pain today but able to walk with min assist. Rest x2 for DOE. Ed completed including Brilinta, stent, HF, daily wts, low sodium, ex, NTG and CRPII. Will send referral to [facility]. Pt needs to buy scales today and I suggested a cane at least for when she needs it.  [id number]   Lynch, ACSM 10/09/2015 10:24 AM

## 2015-10-09 NOTE — Progress Notes (Signed)
Pt and daughter watched heart attack video and life vest video.  Life vest correctly put on by pt. In anticipation of d/c.

## 2015-10-09 NOTE — Progress Notes (Addendum)
Patient Profile: 75 y.o. female with a history of morbid obesity, HTN, HLD, hypothyroidism, LBBB, lower extremity lymphedema, chronic systolic CHF (EF [id number] (negative stress MR) and pre-diabetes who presented to [facility] via EMS after cardiac arrest. S/p PCI to LAD. EF 25-30%.   Subjective: Says she feels weak, was able to ambulate 120 feet yesterday had some DOE. Sat up all day yesterday.  She is concerned about cramping in her hands.  Denies chest pain, palpitations.   Objective: Vital signs in last 24 hours: Temp:  [97 F (36.1 C)-98.3 F (36.8 C)] 98.3 F (36.8 C) (03/01 0207) Pulse Rate:  [74-94] 74 (03/01 0207) Resp:  [15-19] 19 (03/01 0207) BP: (109-150)/(33-66) 133/48 mmHg (03/01 0207) SpO2:  [97 %-98 %] 97 % (03/01 0207) Weight:  [253 lb 1.4 oz (114.8 kg)] 253 lb 1.4 oz (114.8 kg) (03/01 0207) Last BM Date: 10/07/15  Intake/Output from previous day: 02/28 0701 - 03/01 0700 In: 360 [P.O.:360] Out: 2300 [Urine:2300] Intake/Output this shift: Total I/O In: 120 [P.O.:120] Out: 1300 [Urine:1300]  Medications Current Facility-Administered Medications  Medication Dose Route Frequency Provider Last Rate Last Dose  . 0.9 %  sodium chloride infusion  250 mL Intravenous PRN [name], MD      . acetaminophen (TYLENOL) tablet 650 mg  650 mg Oral Q4H PRN Eileen Stanford, PA-C   650 mg at 10/08/15 1759  . aspirin EC tablet 81 mg  81 mg Oral Daily Eileen Stanford, PA-C   81 mg at 10/08/15 [id number]  . atorvastatin (LIPITOR) tablet 20 mg  20 mg Oral Daily Eileen Stanford, PA-C   20 mg at 10/08/15 1759  . carvedilol (COREG) tablet 18.75 mg  18.75 mg Oral BID WC Brett Canales, PA-C   18.75 mg at 10/08/15 1759  . ciprofloxacin (CIPRO) tablet 500 mg  500 mg Oral BID Brett Canales, PA-C   500 mg at 10/08/15 1405  . famotidine (PEPCID) tablet 20 mg  20 mg Oral Daily PRN Eileen Stanford, PA-C      . furosemide (LASIX) tablet 40 mg  40 mg Oral Daily Jolaine Artist, MD   40 mg at 10/08/15 0856  . lisinopril (PRINIVIL,ZESTRIL) tablet 5 mg  5 mg Oral Daily Eileen Stanford, PA-C   5 mg at 10/08/15 0855  . nitroGLYCERIN (NITROSTAT) SL tablet 0.4 mg  0.4 mg Sublingual Q5 Min x 3 PRN Eileen Stanford, PA-C      . ondansetron [facility]) injection 4 mg  4 mg Intravenous Q6H PRN Eileen Stanford, PA-C      . sodium chloride flush (NS) 0.9 % injection 3 mL  3 mL Intravenous Q12H [name], MD      . sodium chloride flush (NS) 0.9 % injection 3 mL  3 mL Intravenous PRN [name], MD      . spironolactone (ALDACTONE) tablet 12.5 mg  12.5 mg Oral Daily Jolaine Artist, MD   12.5 mg at 10/08/15 1318  . thyroid (ARMOUR) tablet 15 mg  15 mg Oral Q0600 [name], MD   15 mg at 10/09/15 0558  . ticagrelor (BRILINTA) tablet 90 mg  90 mg Oral BID [name], MD   90 mg at 10/08/15 2123    PE: General appearance: alert, cooperative and appears stated age Neck: no adenopathy, no carotid bruit, no JVD, supple, symmetrical, trachea midline and thyroid not enlarged, symmetric, no tenderness/mass/nodules Lungs: clear to auscultation  bilaterally Heart: regular rate and rhythm, S1, S2 normal, no murmur, click, rub or gallop Abdomen: soft, non-tender; bowel sounds normal; no masses,  no organomegaly Extremities: Chronic lymphedema in all extremities Pulses: 2+ and symmetric Skin: Skin color, texture, turgor normal. No rashes or lesions or Scattered bruising on BLE  Neurologic: Alert and oriented X 3, normal strength and tone. Normal symmetric reflexes. Normal coordination and gait  Lab Results:   Recent Labs  10/08/15 1035  WBC 12.2*  HGB 11.0*  HCT 33.8*  PLT 244   BMET  Recent Labs  10/07/15 0235 10/08/15 1035 10/08/15 1920  NA 139 137 137  K 4.7 4.6 4.0  CL 102 105 100*  CO2 25 23 23   GLUCOSE 123* 130* 131*  BUN 13 12 16   CREATININE 1.04* 1.08* 1.20*  CALCIUM 9.2 9.1 9.2     Studies/Results:  Procedures    Left Heart Cath and Coronary Angiography    Conclusion     Mid LAD lesion, 80% stenosed.  There is moderate left ventricular systolic dysfunction.  1. 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 2. Moderate LV dysfunction with global hypokinesis. 3. Elevated LV filling pressures with EDP 30 mm Hg.   Procedures    Coronary Stent Intervention    Conclusion    1. Severe stenosis mid LAD 2. Successful PTCA/DES x 1 mid LAD     Study Conclusions  - Left ventricle: The cavity size was mildly dilated. There was mild concentric hypertrophy. Systolic function was severely reduced. The estimated ejection fraction was in the range of 25% to 30%. There is akinesis of the basal-mid inferoseptal and apical septal myocardium. There is akinesis of the entireinferior myocardium. There is akinesis of the basal-mid anteroseptal and apical anterior myocardium. The apex and anterior wall are not adequately visualized to comment on. Features are consistent with a pseudonormal left ventricular filling pattern, with concomitant abnormal relaxation and increased filling pressure (grade 2 diastolic dysfunction). - Aortic valve: Poorly visualized. Trileaflet; mildly thickened, mildly calcified leaflets.   Assessment/Plan  Principal Problem:   Cardiac arrest [facility]) Active Problems:   Lymphedema   Chronic diastolic CHF (congestive heart failure) (HCC)   Essential hypertension   Ventricular fibrillation (HCC)   Chronic systolic heart failure (HCC)   PVC's (premature ventricular contractions)   Unstable angina (HCC)   UTI (urinary tract infection)  1. Cardiac Arrest - s/p DES to mid LAD on 10/03/15.  - Echo 2/24: Ef 25-30% akinesis of the basal-mid inferoseptal andapical septal myocardium. There is akinesis of the entire inferior myocardium. There is akinesis of the basal-mid anteroseptal andapical anterior  myocardium. EF in 2013 was 35%.  - Was fitted for Life Vest yesterday.   2. 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 Haroldine Laws outpt.  - on po Lasix, negative 1.1 L  - No SOB at rest. - Weight continues to trend down.  - On ACE, BB, Spiro  3. CAD - BB increased yesterday.  - On Brilinta and ASA  - On moderate dose statin, A1c was 6.2  4. VTach - Was having small runs (2-3 beats).  - Improved overnight - K is 3.9 today.    LOS: 6 days    Arbutus Leas, NP   History and all data above reviewed.  Patient examined.  I agree with the findings as above.  Ready to go home.  No chest pain.  No SOB.  The patient  exam reveals COR:RRR  ,  Lungs: Clear  ,  Abd: Positive bowel sounds, no rebound no guarding, Ext Moderate  .  All available labs, radiology testing, previous records reviewed. Agree with documented assessment and plan. OK to go home with meds as listed and plans for follow up.    Jeneen Rinks [facility]  9:42 AM  10/09/2015

## 2015-10-09 NOTE — Telephone Encounter (Signed)
7 day TCM fu appt-Scott 10-15-15 at 10a

## 2015-10-09 NOTE — Progress Notes (Signed)
D/c instructions reviewed in length with pt and daughter (>20 minutes), both voice understanding.  Pt seems to be in denial of severity of situation.  Took much persuasion to talk pt out of going grocery shopping and running errands today with frequent reminders not to overdo it.  Pt aware she cannot drive until released by physician. Able to explain when to call MD for s/s CHF and how to use NTG.  IV/Tele d/c'd by NT Pamala Hurry.  To front door per w/c with volunteer.

## 2015-10-09 NOTE — Discharge Instructions (Signed)
Coronary Angiogram With Stent, Care After Refer to this sheet in the next few weeks. These instructions provide you with information about caring for yourself after your procedure. Your health care provider may also give you more specific instructions. Your treatment has been planned according to current medical practices, but problems sometimes occur. Call your health care provider if you have any problems or questions after your procedure. WHAT TO EXPECT AFTER THE PROCEDURE  After your procedure, it is typical to have the following:  Bruising at the catheter insertion site that usually fades within 1-2 weeks.  Blood collecting in the tissue (hematoma) that may be painful to the touch. It should usually decrease in size and tenderness within 1-2 weeks. HOME CARE INSTRUCTIONS  Take medicines only as directed by your health care provider. Blood thinners may be prescribed after your procedure to improve blood flow through the stent.  You may shower 24-48 hours after the procedure or as directed by your health care provider. Remove the bandage (dressing) and gently wash the catheter insertion site with plain soap and water. Pat the area dry with a clean towel. Do not rub the site, because this may cause bleeding.  Do not take baths, swim, or use a hot tub until your health care provider approves.  Check your catheter insertion site every day for redness, swelling, or drainage.  Do not apply powder or lotion to the site.  Do not lift over 10 lb (4.5 kg) for 5 days after your procedure or as directed by your health care provider.  Ask your health care provider when it is okay to:  Return to work or school.  Resume usual physical activities or sports.  Resume sexual activity.  Eat a heart-healthy diet. This should include plenty of fresh fruits and vegetables. Meat should be lean cuts. Avoid the following types of food:  Food that is high in salt.  Canned or highly processed  food.  Food that is high in saturated fat or sugar.  Fried food.  Make any other lifestyle changes as recommended by your health care provider. These may include:  Not using any tobacco products, including cigarettes, chewing tobacco, or electronic cigarettes.If you need help quitting, ask your health care provider.  Managing your weight.  Getting regular exercise.  Managing your blood pressure.  Limiting your alcohol intake.  Managing other health problems, such as diabetes.  If you need an MRI after your heart stent has been placed, be sure to tell the health care provider who orders the MRI that you have a heart stent.  Keep all follow-up visits as directed by your health care provider. This is important. SEEK MEDICAL CARE IF:  You have a fever.  You have chills.  You have increased bleeding from the catheter insertion site. Hold pressure on the site. SEEK IMMEDIATE MEDICAL CARE IF:  You develop chest pain or shortness of breath, feel faint, or pass out.  You have unusual pain at the catheter insertion site.  You have redness, warmth, or swelling at the catheter insertion site.  You have drainage (other than a small amount of blood on the dressing) from the catheter insertion site.  The catheter insertion site is bleeding, and the bleeding does not stop after 30 minutes of holding steady pressure on the site.  You develop bleeding from any other place, such as from your rectum. There may be bright red blood in your urine or stool, or it may appear as black, tarry stool.  This information is not intended to replace advice given to you by your health care provider. Make sure you discuss any questions you have with your health care provider.   Document Released: 02/13/2005 Document Revised: 08/17/2014 Document Reviewed: 12/19/2012 Elsevier Interactive Patient Education 2016 [employer].  Chartered certified accountant Patient Education [employer].

## 2015-10-09 NOTE — Telephone Encounter (Signed)
Pt to be discharged from the hospital today 10/09/15.  Triage to follow-up with the pt for a TCM call tomorrow 10/10/15.  Will defer this message to triage desktop for follow-up.

## 2015-10-09 NOTE — Progress Notes (Signed)
Sitting in chair, pleasant and asking appropriate questions.  C/O cramping in hands and legs.  C/O soreness with activity in ribs and left knee.  Pedal edema pitting 2-3+, pt states improved overall but already worse than it was when she woke up.  Pt agrees not to get up for any reason without assistance due to generalized weakness and occasional dizziness.  Pt states appetite is poor but does not like any artificial sweeteners, agrees to try ensure or boost at this time.  Up in chair with call light in reach.
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
## 2015-10-10 NOTE — Telephone Encounter (Signed)
Follow up     FYI Pt want home health delayed until early next week because she is in [place] with her daughter for approx 1 week.

## 2015-10-10 NOTE — Telephone Encounter (Addendum)
Patient contacted regarding discharge from [facility] on 10/09/2015.  Patient understands to follow up with provider on 10/18/15 at [facility] at 9:30 a.m..  (patient was scheduled for 3/7 but needed to reschedule so her son/family could attend appt)  Patient understands discharge instructions?  Yes  Patient understands medications and regiment?  Yes  Patient understands to bring all medications to this visit?  Yes  Other details: Patient is also taking a Probiotic (while on abx), Multi-vitamin, Fish Oil & Vitamin D. (she understands to bring these, along with her other medications to appt).  She understands we will add these to her med list when she comes for follow-up  She would like to establish her care with Dr. Haroldine Laws.

## 2015-10-15 ENCOUNTER — Encounter: Payer: Medicare Other | Admitting: Physician Assistant
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
## 2015-10-18 ENCOUNTER — Encounter: Payer: Self-pay | Admitting: Cardiology

## 2015-10-18 ENCOUNTER — Telehealth: Payer: Self-pay | Admitting: Cardiology

## 2015-10-18 ENCOUNTER — Ambulatory Visit (INDEPENDENT_AMBULATORY_CARE_PROVIDER_SITE_OTHER): Payer: Medicare Other | Admitting: Cardiology

## 2015-10-18 VITALS — BP 100/42 | HR 69 | Ht 62.0 in | Wt 247.4 lb

## 2015-10-18 DIAGNOSIS — I469 Cardiac arrest, cause unspecified: Secondary | ICD-10-CM

## 2015-10-18 DIAGNOSIS — R0602 Shortness of breath: Secondary | ICD-10-CM

## 2015-10-18 DIAGNOSIS — D649 Anemia, unspecified: Secondary | ICD-10-CM | POA: Diagnosis not present

## 2015-10-18 DIAGNOSIS — M25552 Pain in left hip: Secondary | ICD-10-CM | POA: Diagnosis not present

## 2015-10-18 DIAGNOSIS — I5042 Chronic combined systolic (congestive) and diastolic (congestive) heart failure: Secondary | ICD-10-CM

## 2015-10-18 DIAGNOSIS — Z9189 Other specified personal risk factors, not elsewhere classified: Secondary | ICD-10-CM

## 2015-10-18 DIAGNOSIS — E876 Hypokalemia: Secondary | ICD-10-CM | POA: Diagnosis not present

## 2015-10-18 LAB — CBC
HCT: 33.2 % — ABNORMAL LOW (ref 36.0–46.0)
Hemoglobin: 11.2 g/dL — ABNORMAL LOW (ref 12.0–15.0)
MCH: 28.2 pg (ref 26.0–34.0)
MCHC: 33.7 g/dL (ref 30.0–36.0)
MCV: 83.6 fL (ref 78.0–100.0)
MPV: 9.9 fL (ref 8.6–12.4)
Platelets: 414 10*3/uL — ABNORMAL HIGH (ref 150–400)
RBC: 3.97 MIL/uL (ref 3.87–5.11)
RDW: 14.9 % (ref 11.5–15.5)
WBC: 13.9 10*3/uL — ABNORMAL HIGH (ref 4.0–10.5)

## 2015-10-18 LAB — COMPREHENSIVE METABOLIC PANEL
ALT: 23 U/L (ref 6–29)
AST: 15 U/L (ref 10–35)
Albumin: 3.8 g/dL (ref 3.6–5.1)
Alkaline Phosphatase: 92 U/L (ref 33–130)
BUN: 52 mg/dL — ABNORMAL HIGH (ref 7–25)
CO2: 24 mmol/L (ref 20–31)
Calcium: 9.5 mg/dL (ref 8.6–10.4)
Chloride: 99 mmol/L (ref 98–110)
Creat: 1.43 mg/dL — ABNORMAL HIGH (ref 0.60–0.93)
Glucose, Bld: 108 mg/dL — ABNORMAL HIGH (ref 65–99)
Potassium: 5.6 mmol/L — ABNORMAL HIGH (ref 3.5–5.3)
Sodium: 136 mmol/L (ref 135–146)
Total Bilirubin: 0.7 mg/dL (ref 0.2–1.2)
Total Protein: 7.1 g/dL (ref 6.1–8.1)

## 2015-10-18 LAB — MAGNESIUM: Magnesium: 2.6 mg/dL — ABNORMAL HIGH (ref 1.5–2.5)

## 2015-10-18 NOTE — Patient Instructions (Addendum)
Medication Instructions:   START TAKING LISINOPRIL 5 MG ONCE A DAY    If you need a refill on your cardiac medications before your next appointment, please call your pharmacy.  Labwork:  CMP CBC BNP  MAG    Testing/Procedures:  LEFT HIP .Marland KitchenMarland KitchenNon-Cardiac CT scanning, (CAT scanning), is a noninvasive, special x-ray that produces cross-sectional images of the body using x-rays and a computer. CT scans help physicians diagnose and treat medical conditions. For some CT exams, a contrast material is used to enhance visibility in the area of the body being studied. CT scans provide greater clarity and reveal more details than regular x-ray exams.   Follow-Up: WITH DR  [facility] FOR NON AND ISCHEMIC HEART FAILURE  IN 3 TO 4 WEEKS OR NEXT AS SOON AS POSSIBLE APPT SLOT   Any Other Special Instructions Will Be Listed Below (If Applicable).   NO DRIVING FOR 6 MONTHS

## 2015-10-18 NOTE — Telephone Encounter (Signed)
New message      Please clarify changes in medication.  Patient reported mild dizziness with positional changes.  Please clarify any restrictions with fluid intake.  Patient is not clear if the life vest she is wearing will alert the doc of any problems or will it just record the problem.

## 2015-10-18 NOTE — Telephone Encounter (Signed)
Returned Lisa [name] from [facility] call.  He just wanted to clarify what medication changes the pt had and any fluid restrictions. I went over instructions that the pt was given at her o/v this morning/ Lisa [name] verbalized understanding.

## 2015-10-19 ENCOUNTER — Telehealth: Payer: Self-pay | Admitting: Physician Assistant

## 2015-10-19 LAB — BRAIN NATRIURETIC PEPTIDE: Brain Natriuretic Peptide: 47.1 pg/mL (ref <100)

## 2015-10-19 NOTE — Telephone Encounter (Signed)
I called Ms Ruscoe at the request of Cecilie Kicks because her BMET was abnormal.   BMET    Component Value Date/Time   NA 136 10/18/2015 1055   K 5.6* 10/18/2015 1055   CL 99 10/18/2015 1055   CO2 24 10/18/2015 1055   GLUCOSE 108* 10/18/2015 1055   BUN 52* 10/18/2015 1055   CREATININE 1.43* 10/18/2015 1055   CREATININE 1.04* 10/09/2015 0610   CALCIUM 9.5 10/18/2015 1055   GFRNONAA 52* 10/09/2015 0610   GFRNONAA 46* 02/14/2015 1446   GFRAA >60 10/09/2015 0610   GFRAA 53* 02/14/2015 1446    Her K+, BUN & Cr were all higher than usual. She is not on potassium supplement.  I left a message requesting her to hold her Lasix, lisinopril and spironolactone.  I will keep trying to reach her by phone to make sure she got the message. Come back next week for labs.   Lenoard Aden 10/19/2015 5:22 PM Beeper [phone]

## 2015-10-20 ENCOUNTER — Other Ambulatory Visit: Payer: Self-pay | Admitting: Physician Assistant

## 2015-10-20 DIAGNOSIS — E875 Hyperkalemia: Secondary | ICD-10-CM

## 2015-10-20 NOTE — Telephone Encounter (Signed)
10/20/2015  Spoke with Ms Lisa [name] by phone. She will stop eating bananas and oranges. She will hold her meds as requested. BMET ordered, she cannot get here until tomorrow.   She is asymptomatic so will hold off on Kayexalate for now.  Lenoard Aden 10/20/2015 7:52 AM Beeper [phone]

## 2015-10-21 ENCOUNTER — Other Ambulatory Visit (INDEPENDENT_AMBULATORY_CARE_PROVIDER_SITE_OTHER): Payer: Medicare Other | Admitting: *Deleted

## 2015-10-21 ENCOUNTER — Ambulatory Visit (INDEPENDENT_AMBULATORY_CARE_PROVIDER_SITE_OTHER)
Admission: RE | Admit: 2015-10-21 | Discharge: 2015-10-21 | Disposition: A | Discharge status: Home / Self Care | Payer: Medicare Other | Source: Ambulatory Visit | Attending: Cardiology | Admitting: Cardiology

## 2015-10-21 DIAGNOSIS — E876 Hypokalemia: Secondary | ICD-10-CM

## 2015-10-21 DIAGNOSIS — E875 Hyperkalemia: Secondary | ICD-10-CM | POA: Diagnosis not present

## 2015-10-21 LAB — BASIC METABOLIC PANEL
BUN: 44 mg/dL — ABNORMAL HIGH (ref 7–25)
CO2: 24 mmol/L (ref 20–31)
Calcium: 9.6 mg/dL (ref 8.6–10.4)
Chloride: 100 mmol/L (ref 98–110)
Creat: 1.15 mg/dL — ABNORMAL HIGH (ref 0.60–0.93)
Glucose, Bld: 117 mg/dL — ABNORMAL HIGH (ref 65–99)
Potassium: 5.2 mmol/L (ref 3.5–5.3)
Sodium: 135 mmol/L (ref 135–146)

## 2015-10-22 ENCOUNTER — Telehealth: Payer: Self-pay | Admitting: Cardiology

## 2015-10-22 ENCOUNTER — Telehealth (HOSPITAL_COMMUNITY): Payer: Self-pay | Admitting: *Deleted

## 2015-10-22 ENCOUNTER — Telehealth (HOSPITAL_COMMUNITY): Payer: Self-pay | Admitting: Vascular Surgery

## 2015-10-22 ENCOUNTER — Telehealth: Payer: Self-pay | Admitting: *Deleted

## 2015-10-22 DIAGNOSIS — I10 Essential (primary) hypertension: Secondary | ICD-10-CM

## 2015-10-22 MED ORDER — FUROSEMIDE 40 MG PO TABS: 40.0000 mg | ORAL_TABLET | ORAL | Status: DC

## 2015-10-22 NOTE — Telephone Encounter (Signed)
New Message  Pt requesting to speak w/ RN- about upcoming physical therapy. Please call back and discuss.

## 2015-10-22 NOTE — Telephone Encounter (Signed)
Left pt messagse to make f/u appt w/ db in 3 to 4 weeks

## 2015-10-22 NOTE — Telephone Encounter (Signed)
Had to call pt re: CT Hip and that it was fractured and had a effusion and pt was asking about her lab results.  Had [name], PA-C look at her labs.  He recommended for pt to stay off her meds that was stopped, Lisinopril, Spironolactone & Lasix and have her labs repeated in 1 week. Order put in EPIC Pt verbalized understanding.

## 2015-10-22 NOTE — Telephone Encounter (Signed)
Pt called to discuss meds, she is sch to see Dr Haroldine Laws on 4/21.  She states she saw Cecilie Kicks, NP 3/10 and had labs which were abnormal, she was instructed to stop Lisinopril, Arlyce Harman and Lasix and she has been off since that time.  She states she had repeat labs yesterday and was told Mickel Baas was not in the office all week so another provider reviewed labs and told her to remain off meds nad repeat labs next week.  She states she is uncomfortable being off meds so long and would like Dr Bensimhon's opinion.  Advised labs were slightly improved but K level is still a little, would discuss w/Dr Bensimhon and call her back.    Discussed w/Dr Bensimhon, he recommends pt stay off Lisinopril and Sprio, ok to restart Lasix at 40 mg QOD keep lab appt as sch and f/u w/Laura Dorene Ar, NP for further directions after those labs, she is aware and agreeable

## 2015-10-22 NOTE — Telephone Encounter (Signed)
**Note De-Identified Lisa [name] Obfuscation** The pt was advised by this office this morning that she has a small hip fracture and joint effusion and to see her orthopaedic MD soon.  She called [place] back later today asking if she should stop taking PT as her hip has been hurting every time she has PT.  She is advised per Dr Radford Pax, DOD, to stop PT and to see her orthopaedic MD tomorrow. She verbalized understanding and states that she has called Dr Ninfa Linden, her orthopaedic MD, and is waiting for them to call her back. She is advised that I have forwarded her hip CT results to Dr Ninfa Linden with a message that the pt needs to be seen tomorrow.  Will forward message to Dr Acie Fredrickson as Juluis Rainier.

## 2015-10-22 NOTE — Telephone Encounter (Signed)
-----   Message from Consuelo Pandy, [place] sent at 10/22/2015 11:04 AM EDT ----- CT of hip shows small hip fracture and joint effusion (collection of fluid in joint space). Please notify patient and place referral to orthopedics. If she does not have an orthopedic MD, recommend referring her to [facility] with hip specialist.

## 2015-10-23 ENCOUNTER — Inpatient Hospital Stay: Admission: RE | Admit: 2015-10-23 | Payer: Medicare Other | Source: Ambulatory Visit

## 2015-10-25 ENCOUNTER — Other Ambulatory Visit: Payer: Medicare Other

## 2015-10-29 ENCOUNTER — Other Ambulatory Visit (INDEPENDENT_AMBULATORY_CARE_PROVIDER_SITE_OTHER): Payer: Medicare Other | Admitting: *Deleted

## 2015-10-29 DIAGNOSIS — I10 Essential (primary) hypertension: Secondary | ICD-10-CM | POA: Diagnosis not present

## 2015-10-29 LAB — BASIC METABOLIC PANEL
BUN: 33 mg/dL — ABNORMAL HIGH (ref 7–25)
CO2: 25 mmol/L (ref 20–31)
Calcium: 9.7 mg/dL (ref 8.6–10.4)
Chloride: 102 mmol/L (ref 98–110)
Creat: 1.16 mg/dL — ABNORMAL HIGH (ref 0.60–0.93)
Glucose, Bld: 127 mg/dL — ABNORMAL HIGH (ref 65–99)
Potassium: 4.9 mmol/L (ref 3.5–5.3)
Sodium: 136 mmol/L (ref 135–146)

## 2015-11-04 ENCOUNTER — Other Ambulatory Visit (HOSPITAL_COMMUNITY): Payer: Self-pay

## 2015-11-04 MED ORDER — TICAGRELOR 90 MG PO TABS: 90.0000 mg | ORAL_TABLET | Freq: Two times a day (BID) | ORAL | Status: DC

## 2015-11-08 ENCOUNTER — Encounter: Payer: Self-pay | Admitting: Cardiology

## 2015-11-08 ENCOUNTER — Ambulatory Visit (INDEPENDENT_AMBULATORY_CARE_PROVIDER_SITE_OTHER): Payer: Medicare Other | Admitting: Cardiology

## 2015-11-08 VITALS — BP 110/60 | HR 76 | Ht 62.0 in | Wt 240.0 lb

## 2015-11-08 DIAGNOSIS — I469 Cardiac arrest, cause unspecified: Secondary | ICD-10-CM | POA: Diagnosis not present

## 2015-11-08 DIAGNOSIS — I10 Essential (primary) hypertension: Secondary | ICD-10-CM

## 2015-11-08 DIAGNOSIS — I429 Cardiomyopathy, unspecified: Secondary | ICD-10-CM

## 2015-11-08 DIAGNOSIS — Z9189 Other specified personal risk factors, not elsewhere classified: Secondary | ICD-10-CM

## 2015-11-08 DIAGNOSIS — I890 Lymphedema, not elsewhere classified: Secondary | ICD-10-CM

## 2015-11-08 NOTE — Patient Instructions (Signed)
Continue same medications   Keep appointment with Dr.Bensimhon Friday 11/29/15 at 9:40 am
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
## 2015-11-09 ENCOUNTER — Encounter: Payer: Self-pay | Admitting: Cardiology

## 2015-11-29 ENCOUNTER — Ambulatory Visit (HOSPITAL_COMMUNITY)
Admission: RE | Admit: 2015-11-29 | Discharge: 2015-11-29 | Disposition: A | Discharge status: Home / Self Care | Payer: Medicare Other | Source: Ambulatory Visit | Attending: Internal Medicine | Admitting: Internal Medicine

## 2015-11-29 VITALS — BP 138/86 | HR 84 | Wt 241.0 lb

## 2015-11-29 DIAGNOSIS — Z881 Allergy status to other antibiotic agents status: Secondary | ICD-10-CM | POA: Insufficient documentation

## 2015-11-29 DIAGNOSIS — I5023 Acute on chronic systolic (congestive) heart failure: Secondary | ICD-10-CM | POA: Insufficient documentation

## 2015-11-29 DIAGNOSIS — Z85820 Personal history of malignant melanoma of skin: Secondary | ICD-10-CM | POA: Diagnosis not present

## 2015-11-29 DIAGNOSIS — I5022 Chronic systolic (congestive) heart failure: Secondary | ICD-10-CM

## 2015-11-29 DIAGNOSIS — E785 Hyperlipidemia, unspecified: Secondary | ICD-10-CM | POA: Insufficient documentation

## 2015-11-29 DIAGNOSIS — Z880 Allergy status to penicillin: Secondary | ICD-10-CM | POA: Diagnosis not present

## 2015-11-29 DIAGNOSIS — Z882 Allergy status to sulfonamides status: Secondary | ICD-10-CM | POA: Insufficient documentation

## 2015-11-29 DIAGNOSIS — Z955 Presence of coronary angioplasty implant and graft: Secondary | ICD-10-CM | POA: Diagnosis not present

## 2015-11-29 DIAGNOSIS — Z6841 Body Mass Index (BMI) 40.0 and over, adult: Secondary | ICD-10-CM | POA: Insufficient documentation

## 2015-11-29 DIAGNOSIS — Z79899 Other long term (current) drug therapy: Secondary | ICD-10-CM | POA: Insufficient documentation

## 2015-11-29 DIAGNOSIS — I255 Ischemic cardiomyopathy: Secondary | ICD-10-CM | POA: Diagnosis not present

## 2015-11-29 DIAGNOSIS — Z7982 Long term (current) use of aspirin: Secondary | ICD-10-CM | POA: Insufficient documentation

## 2015-11-29 DIAGNOSIS — I890 Lymphedema, not elsewhere classified: Secondary | ICD-10-CM | POA: Diagnosis not present

## 2015-11-29 DIAGNOSIS — Z7902 Long term (current) use of antithrombotics/antiplatelets: Secondary | ICD-10-CM | POA: Insufficient documentation

## 2015-11-29 DIAGNOSIS — E039 Hypothyroidism, unspecified: Secondary | ICD-10-CM | POA: Insufficient documentation

## 2015-11-29 DIAGNOSIS — I110 Hypertensive heart disease with heart failure: Secondary | ICD-10-CM | POA: Insufficient documentation

## 2015-11-29 DIAGNOSIS — Z885 Allergy status to narcotic agent status: Secondary | ICD-10-CM | POA: Insufficient documentation

## 2015-11-29 DIAGNOSIS — I2510 Atherosclerotic heart disease of native coronary artery without angina pectoris: Secondary | ICD-10-CM | POA: Insufficient documentation

## 2015-11-29 DIAGNOSIS — Z8249 Family history of ischemic heart disease and other diseases of the circulatory system: Secondary | ICD-10-CM | POA: Insufficient documentation

## 2015-11-29 DIAGNOSIS — I428 Other cardiomyopathies: Secondary | ICD-10-CM | POA: Diagnosis not present

## 2015-11-29 DIAGNOSIS — I4901 Ventricular fibrillation: Secondary | ICD-10-CM

## 2015-11-29 DIAGNOSIS — Z8674 Personal history of sudden cardiac arrest: Secondary | ICD-10-CM | POA: Insufficient documentation

## 2015-11-29 MED ORDER — PATIROMER SORBITEX CALCIUM 8.4 G PO PACK: 8.4000 g | PACK | Freq: Every day | ORAL | Status: DC

## 2015-11-29 MED ORDER — TICAGRELOR 90 MG PO TABS: 90.0000 mg | ORAL_TABLET | Freq: Two times a day (BID) | ORAL | Status: DC

## 2015-11-29 MED ORDER — LISINOPRIL 5 MG PO TABS: 5.0000 mg | ORAL_TABLET | Freq: Every day | ORAL | Status: DC
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
## 2015-11-29 NOTE — Patient Instructions (Addendum)
START Lisinopril 5 mg tablet once every evening at bedtime.  Will start Veltassa. Company will be in contact with you via phone to confirmyour address and instruct you on taking this medication  Refill for Brilinta 90 days sent to [facility].  Return in 1-2 weeks for labs.  Will refer you to Cardiac Rehab at [facility]. They will call you to set up initial appointment.  Will refer you to electrophysiology at [facility]. Address: [address] ([place]), [place], [place] [address]  Phone: [phone]  Follow up with Dr. Haroldine Laws in 2 months.  Do the following things EVERYDAY: 1) Weigh yourself in the morning before breakfast. Write it down and keep it in a log. 2) Take your medicines as prescribed 3) Eat low salt foods-Limit salt (sodium) to 2000 mg per day.  4) Stay as active as you can everyday 5) Limit all fluids for the day to less than 2 liters

## 2015-11-29 NOTE — Progress Notes (Signed)
Advanced Heart Failure Medication Review by a Pharmacist  Does the patient  feel that his/her medications are working for him/her?  yes  Has the patient been experiencing any side effects to the medications prescribed?  no  Does the patient measure his/her own blood pressure or blood glucose at home?  yes   Does the patient have any problems obtaining medications due to transportation or finances?   no  Understanding of regimen: good Understanding of indications: good Potential of compliance: good Patient understands to avoid NSAIDs. Patient understands to avoid decongestants.  Issues to address at subsequent visits: none   Pharmacist comments: [name] is a 75 yo woman here for f/u in HF clinic. Recently was told to stop lisinopril due to hyperkalemia (K 5.6) and hypotension in March. Since then, pt has been doing well. However does endorse an episode of dizziness, paleness, and sweating in [name] that went away after resting for a few minutes.  Currently on Armour thyroid 15 mg daily, last TSH wnl in February. Was requesting to have TSH checked. Mentioned to daughter and patient that TSH recently check two months ago and further checks will be done by PCP as he/she feels necessary.   [name], [place].D., BCPS PGY2 Cardiology Pharmacy Resident Pager: [phone]    Time with patient: 5 min Preparation and documentation time: 5 min Total time: 10 min

## 2015-12-05 ENCOUNTER — Encounter (HOSPITAL_COMMUNITY): Payer: Self-pay | Admitting: Pharmacist

## 2015-12-09 ENCOUNTER — Telehealth (HOSPITAL_COMMUNITY): Payer: Self-pay | Admitting: Pharmacist

## 2015-12-09 NOTE — Telephone Encounter (Signed)
Ms. Tangonan called today stating that she started Veltassa last Tuesday and by Friday developed cramping in her hands and legs. She discontinued use over the weekend and this has completely resolved. She is still taking lisinopril 5 mg daily and was planning to go to [facility] for BMET this Thursday. I told her to continue to hold off on Veltassa use until BMET results on Thursday. She states that she was on lisinopril 5 mg daily for years and only had an issue with hyperkalemia when the dose with increased to 10 mg daily. Will follow up with BMET on Thursday and I have told her to call us with any concerns before then.   Ruta Hinds. Velva Harman, PharmD, BCPS, CPP Clinical Pharmacist Pager: [phone] Phone: [phone] 12/09/2015 12:11 PM

## 2015-12-10 ENCOUNTER — Telehealth (HOSPITAL_COMMUNITY): Payer: Self-pay | Admitting: Pharmacist

## 2015-12-10 NOTE — Telephone Encounter (Signed)
If needed, Veltassa approved by [employer] through 08/09/16.   Ruta Hinds. Velva Harman, PharmD, BCPS, CPP Clinical Pharmacist Pager: [phone] Phone: [phone] 12/10/2015 11:22 AM

## 2015-12-12 ENCOUNTER — Other Ambulatory Visit (INDEPENDENT_AMBULATORY_CARE_PROVIDER_SITE_OTHER): Payer: Medicare Other | Admitting: *Deleted

## 2015-12-12 DIAGNOSIS — I5022 Chronic systolic (congestive) heart failure: Secondary | ICD-10-CM | POA: Diagnosis not present

## 2015-12-12 LAB — BASIC METABOLIC PANEL
Anion gap: 9 (ref 5–15)
BUN: 19 mg/dL (ref 6–20)
CO2: 26 mmol/L (ref 22–32)
Calcium: 9.2 mg/dL (ref 8.9–10.3)
Chloride: 104 mmol/L (ref 101–111)
Creatinine, Ser: 1.01 mg/dL — ABNORMAL HIGH (ref 0.44–1.00)
GFR calc Af Amer: >60 mL/min (ref >60)
GFR calc non Af Amer: 54 mL/min — ABNORMAL LOW (ref >60)
Glucose, Bld: 114 mg/dL — ABNORMAL HIGH (ref 65–99)
Potassium: 5 mmol/L (ref 3.5–5.1)
Sodium: 139 mmol/L (ref 135–145)

## 2015-12-12 LAB — TSH: TSH: 1.413 u[IU]/mL (ref 0.350–4.500)

## 2015-12-13 ENCOUNTER — Telehealth (HOSPITAL_COMMUNITY): Payer: Self-pay | Admitting: Pharmacist

## 2015-12-13 NOTE — Telephone Encounter (Signed)
F/u BMET looks stable, serum K 5.0 on lisinopril 5 mg daily and no Veltassa since last Friday. Per Dr. Haroldine Laws, patient to continue current regimen without Veltassa and will reassess at next appointment in June. Left VM for patient to call back to relay info.   Ruta Hinds. Velva Harman, PharmD, BCPS, CPP Clinical Pharmacist Pager: [phone] Phone: [phone] 12/13/2015 11:30 AM

## 2015-12-17 ENCOUNTER — Ambulatory Visit (INDEPENDENT_AMBULATORY_CARE_PROVIDER_SITE_OTHER): Payer: Medicare Other | Admitting: Internal Medicine

## 2015-12-17 ENCOUNTER — Encounter: Payer: Self-pay | Admitting: Internal Medicine

## 2015-12-17 VITALS — BP 116/60 | HR 73 | Ht 62.0 in | Wt 244.4 lb

## 2015-12-17 DIAGNOSIS — I4901 Ventricular fibrillation: Secondary | ICD-10-CM | POA: Diagnosis not present

## 2015-12-17 DIAGNOSIS — I5022 Chronic systolic (congestive) heart failure: Secondary | ICD-10-CM | POA: Diagnosis not present
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
## 2015-12-17 NOTE — Patient Instructions (Signed)
Medication Instructions:  Your physician recommends that you continue on your current medications as directed. Please refer to the Current Medication list given to you today.   Labwork: None ordered   Testing/Procedures: Your physician has requested that you have an echocardiogram. Echocardiography is a painless test that uses sound waves to create images of your heart. It provides your doctor with information about the size and shape of your heart and how well your heart's chambers and valves are working. This procedure takes approximately one hour. There are no restrictions for this procedure.    Follow-Up: Your physician recommends that you schedule a follow-up appointment ---will call with follow up based up echo   Any Other Special Instructions Will Be Listed Below (If Applicable).     If you need a refill on your cardiac medications before your next appointment, please call your pharmacy.

## 2015-12-19 ENCOUNTER — Telehealth (HOSPITAL_COMMUNITY): Payer: Self-pay | Admitting: *Deleted

## 2015-12-19 NOTE — Telephone Encounter (Signed)
Spoke w/pt, she is aware to stay off [place] and continue other meds

## 2015-12-19 NOTE — Telephone Encounter (Signed)
Pt left VM stating she never back from labs last week and is wanting to know if she needs to restart [place] or stay off.    Per Abbe Amsterdam D pt should stay off for now  Attempted to call pt and phone states "VM not set up" will try again later

## 2016-01-02 ENCOUNTER — Ambulatory Visit (HOSPITAL_COMMUNITY): Payer: Medicare Other | Attending: Cardiovascular Disease

## 2016-01-02 ENCOUNTER — Other Ambulatory Visit: Payer: Self-pay

## 2016-01-02 DIAGNOSIS — R29898 Other symptoms and signs involving the musculoskeletal system: Secondary | ICD-10-CM | POA: Insufficient documentation

## 2016-01-02 DIAGNOSIS — Z8249 Family history of ischemic heart disease and other diseases of the circulatory system: Secondary | ICD-10-CM | POA: Diagnosis not present

## 2016-01-02 DIAGNOSIS — I5022 Chronic systolic (congestive) heart failure: Secondary | ICD-10-CM | POA: Diagnosis not present

## 2016-01-02 DIAGNOSIS — I110 Hypertensive heart disease with heart failure: Secondary | ICD-10-CM | POA: Diagnosis not present

## 2016-01-02 DIAGNOSIS — E669 Obesity, unspecified: Secondary | ICD-10-CM | POA: Insufficient documentation

## 2016-01-02 DIAGNOSIS — Z6841 Body Mass Index (BMI) 40.0 and over, adult: Secondary | ICD-10-CM | POA: Diagnosis not present

## 2016-01-02 DIAGNOSIS — I429 Cardiomyopathy, unspecified: Secondary | ICD-10-CM | POA: Diagnosis present

## 2016-01-02 DIAGNOSIS — I447 Left bundle-branch block, unspecified: Secondary | ICD-10-CM | POA: Diagnosis not present

## 2016-01-02 DIAGNOSIS — I2510 Atherosclerotic heart disease of native coronary artery without angina pectoris: Secondary | ICD-10-CM | POA: Diagnosis not present

## 2016-01-07 ENCOUNTER — Telehealth (HOSPITAL_COMMUNITY): Payer: Self-pay | Admitting: *Deleted

## 2016-01-16 ENCOUNTER — Encounter: Payer: Self-pay | Admitting: *Deleted

## 2016-01-16 ENCOUNTER — Telehealth: Payer: Self-pay | Admitting: Internal Medicine

## 2016-01-16 DIAGNOSIS — I255 Ischemic cardiomyopathy: Secondary | ICD-10-CM

## 2016-01-16 NOTE — Telephone Encounter (Signed)
Lab orders in.  Patient will need to make her wound check appointment and her 3 months follow up when here for labs

## 2016-01-16 NOTE — Telephone Encounter (Signed)
New Message  Pt stated that she is coming in tomorrow 6/9 for pre-op bloodwork- stated she is coming in the morning (instead of afternoon) for labs and instuctions. Pt did not require call back

## 2016-01-17 ENCOUNTER — Other Ambulatory Visit (INDEPENDENT_AMBULATORY_CARE_PROVIDER_SITE_OTHER): Payer: Medicare Other

## 2016-01-17 DIAGNOSIS — I255 Ischemic cardiomyopathy: Secondary | ICD-10-CM

## 2016-01-17 LAB — CBC WITH DIFFERENTIAL/PLATELET
Basophils Absolute: 109 cells/uL (ref 0–200)
Basophils Relative: 1 %
Eosinophils Absolute: 109 cells/uL (ref 15–500)
Eosinophils Relative: 1 %
HCT: 36.1 % (ref 35.0–45.0)
Hemoglobin: 11.9 g/dL (ref 11.7–15.5)
Lymphocytes Relative: 30 %
Lymphs Abs: 3270 cells/uL (ref 850–3900)
MCH: 27.9 pg (ref 27.0–33.0)
MCHC: 33 g/dL (ref 32.0–36.0)
MCV: 84.5 fL (ref 80.0–100.0)
MPV: 10.4 fL (ref 7.5–12.5)
Monocytes Absolute: 654 cells/uL (ref 200–950)
Monocytes Relative: 6 %
Neutro Abs: 6758 cells/uL (ref 1500–7800)
Neutrophils Relative %: 62 %
Platelets: 270 10*3/uL (ref 140–400)
RBC: 4.27 MIL/uL (ref 3.80–5.10)
RDW: 14.1 % (ref 11.0–15.0)
WBC: 10.9 10*3/uL — ABNORMAL HIGH (ref 3.8–10.8)

## 2016-01-17 LAB — BASIC METABOLIC PANEL
BUN: 35 mg/dL — ABNORMAL HIGH (ref 7–25)
CO2: 26 mmol/L (ref 20–31)
Calcium: 9.4 mg/dL (ref 8.6–10.4)
Chloride: 103 mmol/L (ref 98–110)
Creat: 1.16 mg/dL — ABNORMAL HIGH (ref 0.60–0.93)
Glucose, Bld: 117 mg/dL — ABNORMAL HIGH (ref 65–99)
Potassium: 4.9 mmol/L (ref 3.5–5.3)
Sodium: 139 mmol/L (ref 135–146)

## 2016-01-20 ENCOUNTER — Encounter (HOSPITAL_COMMUNITY)
Admission: RE | Disposition: A | Discharge status: Home / Self Care | Payer: Self-pay | Source: Ambulatory Visit | Attending: Internal Medicine

## 2016-01-20 ENCOUNTER — Ambulatory Visit (HOSPITAL_COMMUNITY)
Admission: RE | Admit: 2016-01-20 | Discharge: 2016-01-21 | Disposition: A | Discharge status: Home / Self Care | Payer: Medicare Other | Source: Ambulatory Visit | Attending: Internal Medicine | Admitting: Internal Medicine

## 2016-01-20 ENCOUNTER — Encounter (HOSPITAL_COMMUNITY): Payer: Self-pay | Admitting: Internal Medicine

## 2016-01-20 DIAGNOSIS — I5023 Acute on chronic systolic (congestive) heart failure: Secondary | ICD-10-CM | POA: Diagnosis present

## 2016-01-20 DIAGNOSIS — I509 Heart failure, unspecified: Secondary | ICD-10-CM | POA: Insufficient documentation

## 2016-01-20 DIAGNOSIS — I2510 Atherosclerotic heart disease of native coronary artery without angina pectoris: Secondary | ICD-10-CM | POA: Insufficient documentation

## 2016-01-20 DIAGNOSIS — Z8674 Personal history of sudden cardiac arrest: Secondary | ICD-10-CM | POA: Diagnosis not present

## 2016-01-20 DIAGNOSIS — E785 Hyperlipidemia, unspecified: Secondary | ICD-10-CM | POA: Diagnosis not present

## 2016-01-20 DIAGNOSIS — Z6841 Body Mass Index (BMI) 40.0 and over, adult: Secondary | ICD-10-CM | POA: Insufficient documentation

## 2016-01-20 DIAGNOSIS — Z7982 Long term (current) use of aspirin: Secondary | ICD-10-CM | POA: Diagnosis not present

## 2016-01-20 DIAGNOSIS — I5022 Chronic systolic (congestive) heart failure: Secondary | ICD-10-CM | POA: Insufficient documentation

## 2016-01-20 DIAGNOSIS — I255 Ischemic cardiomyopathy: Secondary | ICD-10-CM | POA: Diagnosis present

## 2016-01-20 DIAGNOSIS — Z880 Allergy status to penicillin: Secondary | ICD-10-CM | POA: Insufficient documentation

## 2016-01-20 DIAGNOSIS — Z85820 Personal history of malignant melanoma of skin: Secondary | ICD-10-CM | POA: Insufficient documentation

## 2016-01-20 DIAGNOSIS — I447 Left bundle-branch block, unspecified: Secondary | ICD-10-CM | POA: Diagnosis not present

## 2016-01-20 DIAGNOSIS — E669 Obesity, unspecified: Secondary | ICD-10-CM | POA: Insufficient documentation

## 2016-01-20 DIAGNOSIS — I110 Hypertensive heart disease with heart failure: Secondary | ICD-10-CM | POA: Diagnosis not present

## 2016-01-20 DIAGNOSIS — I252 Old myocardial infarction: Secondary | ICD-10-CM | POA: Insufficient documentation

## 2016-01-20 HISTORY — DX: Other specified postprocedural states: Z98.890

## 2016-01-20 HISTORY — DX: Cardiac arrest, cause unspecified: I46.9

## 2016-01-20 HISTORY — DX: Personal history of urinary (tract) infections: Z87.440

## 2016-01-20 HISTORY — DX: Other complications of anesthesia, initial encounter: T88.59XA

## 2016-01-20 HISTORY — PX: EP IMPLANTABLE DEVICE: SHX172B

## 2016-01-20 HISTORY — DX: Ventricular premature depolarization: I49.3

## 2016-01-20 HISTORY — DX: Other specified postprocedural states: R11.2

## 2016-01-20 HISTORY — DX: Adverse effect of unspecified anesthetic, initial encounter: T41.45XA

## 2016-01-20 LAB — SURGICAL PCR SCREEN
MRSA, PCR: NEGATIVE
Staphylococcus aureus: NEGATIVE

## 2016-01-20 SURGERY — BIV ICD INSERTION CRT-D
Anesthesia: LOCAL

## 2016-01-20 MED ORDER — THYROID 30 MG PO TABS
15.0000 mg | ORAL_TABLET | Freq: Every day | ORAL | Status: DC
  Administered 2016-01-21: 15 mg via ORAL
  Filled 2016-01-20 (×2): qty 1

## 2016-01-20 MED ORDER — MIDAZOLAM HCL 5 MG/5ML IJ SOLN
INTRAMUSCULAR | Status: AC
  Filled 2016-01-20: qty 5

## 2016-01-20 MED ORDER — SODIUM CHLORIDE 0.9 % IV SOLN
INTRAVENOUS | Status: DC
  Administered 2016-01-20: 06:00:00 via INTRAVENOUS

## 2016-01-20 MED ORDER — LISINOPRIL 5 MG PO TABS
5.0000 mg | ORAL_TABLET | Freq: Every day | ORAL | Status: DC
  Administered 2016-01-20: 5 mg via ORAL
  Filled 2016-01-20 (×2): qty 1

## 2016-01-20 MED ORDER — GENTAMICIN SULFATE 40 MG/ML IJ SOLN
INTRAMUSCULAR | Status: AC
  Filled 2016-01-20: qty 2

## 2016-01-20 MED ORDER — GENTAMICIN SULFATE 40 MG/ML IJ SOLN
80.0000 mg | INTRAMUSCULAR | Status: AC
  Administered 2016-01-20: 80 mg

## 2016-01-20 MED ORDER — FUROSEMIDE 40 MG PO TABS
40.0000 mg | ORAL_TABLET | ORAL | Status: DC
  Administered 2016-01-21: 10:00:00 40 mg via ORAL
  Filled 2016-01-20: qty 1

## 2016-01-20 MED ORDER — FENTANYL CITRATE (PF) 100 MCG/2ML IJ SOLN
INTRAMUSCULAR | Status: AC
  Filled 2016-01-20: qty 2

## 2016-01-20 MED ORDER — MUPIROCIN 2 % EX OINT
1.0000 "application " | TOPICAL_OINTMENT | Freq: Once | CUTANEOUS | Status: AC
  Administered 2016-01-20: 1 via TOPICAL
  Filled 2016-01-20: qty 22

## 2016-01-20 MED ORDER — LIDOCAINE HCL (PF) 1 % IJ SOLN
INTRAMUSCULAR | Status: AC
  Filled 2016-01-20: qty 60

## 2016-01-20 MED ORDER — HEPARIN (PORCINE) IN NACL 2-0.9 UNIT/ML-% IJ SOLN
INTRAMUSCULAR | Status: AC
  Filled 2016-01-20: qty 500

## 2016-01-20 MED ORDER — FENTANYL CITRATE (PF) 100 MCG/2ML IJ SOLN
INTRAMUSCULAR | Status: DC | PRN
  Administered 2016-01-20: 25 ug via INTRAVENOUS
  Administered 2016-01-20 (×4): 12.5 ug via INTRAVENOUS
  Administered 2016-01-20 (×2): 25 ug via INTRAVENOUS
  Administered 2016-01-20 (×4): 12.5 ug via INTRAVENOUS

## 2016-01-20 MED ORDER — LIDOCAINE HCL (PF) 1 % IJ SOLN
INTRAMUSCULAR | Status: DC | PRN
  Administered 2016-01-20: 52 mL via INTRADERMAL

## 2016-01-20 MED ORDER — ATORVASTATIN CALCIUM 20 MG PO TABS
20.0000 mg | ORAL_TABLET | Freq: Every day | ORAL | Status: DC
  Administered 2016-01-20: 20 mg via ORAL
  Filled 2016-01-20: qty 1

## 2016-01-20 MED ORDER — CARVEDILOL 3.125 MG PO TABS
18.7500 mg | ORAL_TABLET | Freq: Two times a day (BID) | ORAL | Status: DC
  Administered 2016-01-20 – 2016-01-21 (×2): 18.75 mg via ORAL
  Filled 2016-01-20 (×2): qty 2

## 2016-01-20 MED ORDER — IOPAMIDOL (ISOVUE-370) INJECTION 76%
INTRAVENOUS | Status: AC
  Filled 2016-01-20: qty 50

## 2016-01-20 MED ORDER — HEPARIN (PORCINE) IN NACL 2-0.9 UNIT/ML-% IJ SOLN
INTRAMUSCULAR | Status: DC | PRN
  Administered 2016-01-20: 08:00:00

## 2016-01-20 MED ORDER — IOPAMIDOL (ISOVUE-370) INJECTION 76%
INTRAVENOUS | Status: DC | PRN
  Administered 2016-01-20: 35 mL via INTRAVENOUS

## 2016-01-20 MED ORDER — MIDAZOLAM HCL 5 MG/5ML IJ SOLN
INTRAMUSCULAR | Status: DC | PRN
  Administered 2016-01-20 (×2): 1 mg via INTRAVENOUS
  Administered 2016-01-20 (×2): 2 mg via INTRAVENOUS
  Administered 2016-01-20 (×9): 1 mg via INTRAVENOUS

## 2016-01-20 MED ORDER — MUPIROCIN 2 % EX OINT
TOPICAL_OINTMENT | CUTANEOUS | Status: AC
  Administered 2016-01-20: 1 via TOPICAL
  Filled 2016-01-20: qty 22

## 2016-01-20 MED ORDER — ASPIRIN EC 81 MG PO TBEC
81.0000 mg | DELAYED_RELEASE_TABLET | Freq: Every day | ORAL | Status: DC
  Administered 2016-01-20: 16:00:00 81 mg via ORAL
  Filled 2016-01-20 (×2): qty 1

## 2016-01-20 MED ORDER — CHLORHEXIDINE GLUCONATE 4 % EX LIQD
60.0000 mL | Freq: Once | CUTANEOUS | Status: DC
  Filled 2016-01-20: qty 60

## 2016-01-20 MED ORDER — YOU HAVE A PACEMAKER BOOK
Freq: Once | Status: AC
  Administered 2016-01-20: 18:00:00
  Filled 2016-01-20: qty 1

## 2016-01-20 MED ORDER — ACETAMINOPHEN 325 MG PO TABS
ORAL_TABLET | ORAL | Status: AC
  Filled 2016-01-20: qty 2

## 2016-01-20 MED ORDER — VANCOMYCIN HCL IN DEXTROSE 1-5 GM/200ML-% IV SOLN
INTRAVENOUS | Status: AC
  Filled 2016-01-20: qty 200

## 2016-01-20 MED ORDER — VANCOMYCIN HCL IN DEXTROSE 1-5 GM/200ML-% IV SOLN
1000.0000 mg | Freq: Two times a day (BID) | INTRAVENOUS | Status: AC
  Administered 2016-01-20: 19:00:00 1000 mg via INTRAVENOUS
  Filled 2016-01-20: qty 200

## 2016-01-20 MED ORDER — ACETAMINOPHEN 325 MG PO TABS
325.0000 mg | ORAL_TABLET | ORAL | Status: DC | PRN
  Administered 2016-01-20 – 2016-01-21 (×4): 650 mg via ORAL
  Filled 2016-01-20 (×3): qty 2

## 2016-01-20 MED ORDER — VITAMIN D 1000 UNITS PO TABS
5000.0000 [IU] | ORAL_TABLET | Freq: Every day | ORAL | Status: DC
  Administered 2016-01-20: 5000 [IU] via ORAL
  Filled 2016-01-20 (×3): qty 5

## 2016-01-20 MED ORDER — HEART ATTACK BOUNCING BOOK
Freq: Once | Status: DC
  Filled 2016-01-20: qty 1

## 2016-01-20 MED ORDER — VANCOMYCIN HCL IN DEXTROSE 1-5 GM/200ML-% IV SOLN
1000.0000 mg | INTRAVENOUS | Status: AC
  Administered 2016-01-20: 1000 mg via INTRAVENOUS

## 2016-01-20 MED ORDER — ONDANSETRON HCL 4 MG/2ML IJ SOLN: 4.0000 mg | Freq: Four times a day (QID) | INTRAMUSCULAR | Status: DC | PRN

## 2016-01-20 SURGICAL SUPPLY — 19 items
ADAPTER SEALING SSSA-09 (MISCELLANEOUS) ×1 IMPLANT
AMPLIA MRI QUAD DTMB1QQ (ICD Generator) ×1 IMPLANT
BALLN ATTAIN 80 (BALLOONS) ×2
BALLOON ATTAIN 80 (BALLOONS) IMPLANT
CABLE SURGICAL S-101-97-12 (CABLE) ×2 IMPLANT
CATH ATTAIN COM SURV 6250V-3D (CATHETERS) ×1 IMPLANT
CATH ATTAIN COM SURV 6250V-MB2 (CATHETERS) ×1 IMPLANT
CATH HEX JOSEPH 2-5-2 65CM 6F (CATHETERS) ×1 IMPLANT
KIT ESSENTIALS PG (KITS) ×1 IMPLANT
LEAD ATTAIN PERFORM ST 4398-88 (Lead) ×1 IMPLANT
LEAD CAPSURE NOVUS 45CM (Lead) ×1 IMPLANT
LEAD SPRINT QUAT SEC 6935M-55 (Lead) ×1 IMPLANT
PAD DEFIB LIFELINK (PAD) ×1 IMPLANT
SHEATH CLASSIC 7F (SHEATH) ×1 IMPLANT
SHEATH CLASSIC 9.5F (SHEATH) ×1 IMPLANT
SHEATH CLASSIC 9F (SHEATH) ×1 IMPLANT
SLITTER 6232ADJ (MISCELLANEOUS) ×1 IMPLANT
TRAY PACEMAKER INSERTION (PACKS) ×1 IMPLANT
WIRE LUGE 182CM (WIRE) ×1 IMPLANT

## 2016-01-20 NOTE — Progress Notes (Signed)
Orthopedic Tech Progress Note Patient Details:  Lisa [name] [birth year] [id number]  Ortho Devices Type of Ortho Device: Arm sling   [place] Pink 01/20/2016, 3:01 PM

## 2016-01-20 NOTE — H&P (Signed)
  ICD Criteria  Current LVEF:25%. Within 12 months prior to implant: Yes   Heart failure history: Yes, Class III  Cardiomyopathy history: Yes, Ischemic Cardiomyopathy.  Atrial Fibrillation/Atrial Flutter: No.  Ventricular tachycardia history: Yes, Hemodynamic instability present. VT Type: Sustained Ventricular Tachycardia - Monomorphic.In setting of acute MI  Cardiac arrest history: No.  History of syndromes with risk of sudden death: No.  Previous ICD: No.  Current ICD indication: Primary  PPM indication: No.   Class I or II Bradycardia indication present: No  Beta Blocker therapy for 3 or more months: Yes, prescribed.   Ace Inhibitor/ARB therapy for 3 or more months: Yes, prescribed.

## 2016-01-20 NOTE — H&P (Signed)
HPI Mrs. Malesky is referred today by Dr. Reine Just for ICD insertion. 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 She has worn a life vest. Her EF is low and has been so for a period of time. The patient denies chest pain but she is sedentary. She chronic lymphedema. She has had no additional ICD (life vest) therapy. She has known LBBB with a QRS of 140 ms. She has class 3 heart failure but is limited by her increased weight and lymphedema.  Allergies  Allergen Reactions  . Sulfamethoxazole Rash  . Flagyl [Metronidazole] Itching  . Meperidine     Other reaction(s): Vomiting (intolerance)  . Penicillins Hives and Itching    Has patient had a PCN reaction causing immediate rash, facial/tongue/throat swelling, SOB or lightheadedness with hypotension: Yes  Has patient had a PCN reaction causing severe rash involving mucus membranes or skin necrosis: Yes Has patient had a PCN reaction that required hospitalization No Has patient had a PCN reaction occurring within the last 10 years: NO If all of the above answers are "NO", then may proceed with Cephalosporin use.   . Wheat Bran Other (See Comments)    Running nose  . Doxycycline Rash    Made her feel terrible & gave her a yeast infection  . Lanolin Rash     Current Outpatient Prescriptions  Medication Sig Dispense Refill  . acetaminophen (TYLENOL) 500 MG tablet Take 500-1,000 mg by mouth every 6 (six) hours as needed for moderate pain.    [name] aspirin EC 81 MG tablet Take 81 mg by mouth daily.    [name] atorvastatin (LIPITOR) 20 MG tablet Take 1 tablet (20 mg total) by mouth daily. 90 tablet 3  . carvedilol (COREG) 12.5 MG tablet Take 1.5 tablets (18.75 mg total) by mouth 2 (two) times daily with a meal. 270 tablet 3  . Cholecalciferol (VITAMIN D-3 PO) Take 5,000 Units by mouth daily.     . Coenzyme Q10 (COQ10 PO) Take 1 capsule by mouth daily.    . furosemide (LASIX) 40 MG tablet Take 1 tablet (40 mg total) by mouth every other day. 90 tablet 3  . lisinopril (PRINIVIL,ZESTRIL) 5 MG tablet Take 1 tablet (5 mg total) by mouth daily. 30 tablet 3  . Multiple Vitamin (MULTIVITAMIN) tablet Take 1 tablet by mouth daily.    . nitroGLYCERIN (NITROSTAT) 0.4 MG SL tablet Place 1 tablet (0.4 mg total) under the tongue every 5 (five) minutes x 3 doses as needed for chest pain. 12 tablet 2  . thyroid (ARMOUR) 15 MG tablet Take 15 mg by mouth daily.    . ticagrelor (BRILINTA) 90 MG TABS tablet Take 1 tablet (90 mg total) by mouth 2 (two) times daily. 180 tablet 3   No current facility-administered medications for this visit.     Past Medical History  Diagnosis Date  . Hypertension   . CHF (congestive heart failure) ([place])   . Thyroid disease   . Melanoma ([place])     ROS:  All systems reviewed and negative except as noted in the HPI.   Past Surgical History  Procedure Laterality Date  . Tubal ligation    . Abdominal hysterectomy    . Appendectomy    . Tonsillectomy    . Cardiac catheterization N/A 10/03/2015    Procedure: Left Heart Cath and Coronary Angiography; Surgeon: [name]  M Martinique, MD; Location: [facility]; Service: Cardiovascular; Laterality: N/A;  . Cardiac catheterization N/A 10/07/2015    Procedure: Coronary Stent Intervention; Surgeon: [name], MD; Location: [facility]; Service: Cardiovascular; Laterality: N/A;     Family History  Problem Relation Age of Onset  . Multiple myeloma Father   . Coronary artery disease Mother   . Thyroid disease Mother   . Heart attack Maternal Grandfather   . Heart attack Paternal Grandfather      Social History   Social History  . Marital Status: Divorced     Spouse Name: N/A  . Number of Children: N/A  . Years of Education: N/A   Occupational History  . Not on file.   Social History Main Topics  . Smoking status: Never Smoker   . Smokeless tobacco: Never Used  . Alcohol Use: No  . Drug Use: Not on file  . Sexual Activity: Not on file   Other Topics Concern  . Not on file   Social History Narrative     BP 116/60 mmHg  Pulse 73  Ht '5\' 2"'  (1.575 m)  Wt 244 lb 6.4 oz (110.859 kg)  BMI 44.69 kg/m2  LMP 02/14/1995  Physical Exam:  obese appearing 75 yo woman, NAD HEENT: Unremarkable Neck: 6 cm JVD, no thyromegally Lymphatics: No adenopathy Back: No CVA tenderness Lungs: Clear with basilar rales. HEART: Regular rate rhythm, no murmurs, no rubs, no clicks Abd: soft, obese, positive bowel sounds, no organomegally, no rebound, no guarding Ext: 2 plus pulses, 3+ woody edema, no cyanosis, no clubbing Skin: No rashes no nodules Neuro: CN II through XII intact, motor grossly intact  EKG - NSR with LBBB  Assess/Plan: 1. VF arrest - while she did not have a STEMI, she was revascularized. She will undergo repeat echo in a couple of weeks. If her EF is less than or equal to 35%, she will undergo ICD implant. If her EF is less than 50% but greater than 35%, she will undergo EP study for additional risk assessment.  2. CAD - she denies anginal symptoms. Will follow. 3. Obesity - the importance of weight loss has been stressed 4. Chronic systolic heart failure - this is longstanding and class 3 although she has multiple comorbidities limiting her. If her EF remains down and with LBBB, I would anticipate placing a BiV ICD.  Ponciano Ort.          EP Attending  Patient seen and examined. Agree with above. Since prior clinic visit, no changes in the history, exam, assessment and plan. For BiV ICD implant.  Mikle Bosworth.D.

## 2016-01-21 ENCOUNTER — Ambulatory Visit (HOSPITAL_COMMUNITY): Payer: Medicare Other

## 2016-01-21 DIAGNOSIS — I447 Left bundle-branch block, unspecified: Secondary | ICD-10-CM | POA: Diagnosis not present

## 2016-01-21 DIAGNOSIS — I255 Ischemic cardiomyopathy: Secondary | ICD-10-CM | POA: Diagnosis not present

## 2016-01-21 DIAGNOSIS — I2510 Atherosclerotic heart disease of native coronary artery without angina pectoris: Secondary | ICD-10-CM | POA: Diagnosis not present

## 2016-01-21 DIAGNOSIS — I509 Heart failure, unspecified: Secondary | ICD-10-CM | POA: Diagnosis not present

## 2016-01-21 NOTE — Progress Notes (Signed)
ELECTROPHYSIOLOGY PROCEDURE DISCHARGE SUMMARY    Patient ID: Lisa [name],  MRN: [id number], DOB/AGE: Mar 14, 1941 75 y.o.  Admit date: 01/20/2016 Discharge date: 01/21/2016  Primary Care Physician: [name], MD Primary Cardiologist: Dr. Haroldine Laws Electrophysiologist: Dr. Lovena Le  Primary Discharge Diagnosis:  1. ICM  Secondary Discharge Diagnosis:  1. CAD (PCI A999333) MI complicated by cardiac arrest     On ASA/Brilinta 2. HTN 3. HLD  Allergies  Allergen Reactions  . Sulfamethoxazole Rash  . Flagyl [Metronidazole] Itching  . Meperidine     Other reaction(s): Vomiting (intolerance)  . Penicillins Hives and Itching    Has patient had a PCN reaction causing immediate rash, facial/tongue/throat swelling, SOB or lightheadedness with hypotension: Yes  Has patient had a PCN reaction causing severe rash involving mucus membranes or skin necrosis: Yes Has patient had a PCN reaction that required hospitalization No Has patient had a PCN reaction occurring within the last 10 years: NO If all of the above answers are "NO", then may proceed with Cephalosporin use.   . Wheat Bran Other (See Comments)    Running nose  . Doxycycline Rash    Made her feel terrible & gave her a yeast infection  . Lanolin Rash     Procedures This Admission:  1.  Implantation of a MDT CRT-D on 01/20/16 by Dr Lovena Le.  The patient received aMedtronic (serial Number [id number]) biventricular ICD andMedtronic (serial # [id number] ) right atrial lead and a medtronic (serial number [id number]) right ventricular defibrillator lead with a Medtronic(serial number [id number]) lead.   DFT's were deferred at time of implant/successful at [facility] (failed at 22).  There were no immediate post procedure complications. 2.  CXR on 01/21/16 demonstrated no pneumothorax status post device implantation.   Brief HPI: Lisa [name] is a 75 y.o. female was referred to electrophysiology in the outpatient setting for  consideration of ICD implantation.  Past medical history includes CAD with MI complicated by cardiac arrest in Feb 2016, ICM, HTN, HLD.  The patient has persistent LV dysfunction despite guideline directed therapy.  Risks, benefits, and alternatives to ICD implantation were reviewed with the patient who wished to proceed.   Hospital Course:  The patient was admitted and underwent implantation of a CRT-D with details as outlined above. She was monitored on telemetry overnight which demonstrated SR/V paced.  Left chest was without hematoma or ecchymosis.  The device was interrogated and found to be functioning normally.  CXR was obtained and demonstrated no pneumothorax status post device implantation.  Wound care, arm mobility, and restrictions were reviewed with the patient.  The patient was examined by Dr. Lovena Le and considered stable for discharge to home. The patient has been instructed to resume her ASA and Brilinta without interruption, Dr. Lovena Le has cleared her to drive in 1 week given her cardiac arrest was in the environment of an MI.  The patient's discharge medications include an ACE-I (lisinopril) and beta blocker (carvedilol).    Physical Exam: Filed Vitals:   01/20/16 1730 01/20/16 1936 01/21/16 0230 01/21/16 0717  BP: 158/101 121/62 127/57 142/47  Pulse: 81 81 87 71  Temp:  98.2 F (36.8 C) 98.4 F (36.9 C) 97 F (36.1 C)  TempSrc:  Oral Oral Axillary  Resp: 31 20 17 13   Height:      Weight:   235 lb 14.3 oz (107 kg)   SpO2: 100% 99% 97% 98%    GEN- The patient is well appearing, alert and oriented x 3 today.  HEENT: normocephalic, atraumatic; sclera clear, conjunctiva pink; hearing intact; oropharynx clear Lungs- Clear to ausculation bilaterally, normal work of breathing.  No wheezes, rales, rhonchi Heart- Regular rate and rhythm, no murmurs, rubs or gallops, PMI not laterally displaced GI- soft, non-tender, non-distended Extremities- no clubbing, cyanosis, or edema MS-  no significant deformity or atrophy Skin- warm and dry, no rash or lesion, left chest without hematoma/ecchymosis Psych- euthymic mood, full affect Neuro- no gross defecits  Labs:   Lab Results  Component Value Date   WBC 10.9* 01/17/2016   HGB 11.9 01/17/2016   HCT 36.1 01/17/2016   MCV 84.5 01/17/2016   PLT 270 01/17/2016     Recent Labs Lab 01/17/16 1117  NA 139  K 4.9  CL 103  CO2 26  BUN 35*  CREATININE 1.16*  CALCIUM 9.4  GLUCOSE 117*    Discharge Medications:    Medication List    TAKE these medications        acetaminophen 500 MG tablet  Commonly known as:  TYLENOL  Take 500-1,000 mg by mouth every 6 (six) hours as needed for moderate pain.     aspirin EC 81 MG tablet  Take 81 mg by mouth daily.     atorvastatin 20 MG tablet  Commonly known as:  LIPITOR  Take 1 tablet (20 mg total) by mouth daily.     BIOFLAVONOIDS PO  Take 1 tablet by mouth daily.     carvedilol 12.5 MG tablet  Commonly known as:  COREG  Take 1.5 tablets (18.75 mg total) by mouth 2 (two) times daily with a meal.     COQ10 PO  Take 1 capsule by mouth daily.     furosemide 40 MG tablet  Commonly known as:  LASIX  Take 1 tablet (40 mg total) by mouth every other day.     GRAPE SEED EXTRACT PO  Take 1 capsule by mouth daily.     lisinopril 5 MG tablet  Commonly known as:  PRINIVIL,ZESTRIL  Take 1 tablet (5 mg total) by mouth daily.     multivitamin tablet  Take 1 tablet by mouth daily.     nitroGLYCERIN 0.4 MG SL tablet  Commonly known as:  NITROSTAT  Place 1 tablet (0.4 mg total) under the tongue every 5 (five) minutes x 3 doses as needed for chest pain.     thyroid 15 MG tablet  Commonly known as:  ARMOUR  Take 15 mg by mouth daily.     ticagrelor 90 MG Tabs tablet  Commonly known as:  BRILINTA  Take 1 tablet (90 mg total) by mouth 2 (two) times daily.     VITAMIN D-3 PO  Take 5,000 Units by mouth daily.        Disposition:  home Discharge Instructions      Diet - low sodium heart healthy    Complete by:  As directed      Increase activity slowly    Complete by:  As directed           Follow-up Information    Follow up with [facility] On 01/30/2016.   Specialty:  Cardiology   Why:  3:00PM, wound check   Contact information:   [address], [place] [phone]      Follow up with Cristopher [name], MD On 04/24/2016.   Specialty:  Cardiology   Why:  12:00noon   Contact information:   [address] [place] [address] [phone]       Duration of Discharge Encounter: Greater than 30 minutes including physician time.  Venetia Night, PA-C 01/21/2016 9:35 AM  EP Attending  Patient seen and examined. Agree with above. Her BiV device has been interogated, reprogrammed and is working normally. CXR is ok. Mitchell for DC. Usual followup.  Mikle Bosworth.D.

## 2016-01-21 NOTE — Discharge Instructions (Signed)
° ° °  Supplemental Discharge Instructions for  Pacemaker/Defibrillator Patients  Activity No heavy lifting or vigorous activity with your left/right arm for 6 to 8 weeks.  Do not raise your left/right arm above your head for one week.  Gradually raise your affected arm as drawn below.             01/24/16                     01/25/16                    01/26/16                   01/27/16 __  NO DRIVING for 1 week, you may begin to drive [id number]  WOUND CARE - Keep the wound area clean and dry.  Do not get this area wet for one week. No showers for one week; you may shower on 01/27/16    . - The tape/steri-strips on your wound will fall off; do not pull them off.  No bandage is needed on the site.  DO  NOT apply any creams, oils, or ointments to the wound area. - If you notice any drainage or discharge from the wound, any swelling or bruising at the site, or you develop a fever > 101? F after you are discharged home, call the office at once.  Special Instructions - You are still able to use cellular telephones; use the ear opposite the side where you have your pacemaker/defibrillator.  Avoid carrying your cellular phone near your device. - When traveling through airports, show security personnel your identification card to avoid being screened in the metal detectors.  Ask the security personnel to use the hand wand. - Avoid arc welding equipment, MRI testing (magnetic resonance imaging), TENS units (transcutaneous nerve stimulators).  Call the office for questions about other devices. - Avoid electrical appliances that are in poor condition or are not properly grounded. - Microwave ovens are safe to be near or to operate.  Additional information for defibrillator patients should your device go off: - If your device goes off ONCE and you feel fine afterward, notify the device clinic nurses. - If your device goes off ONCE and you do not feel well afterward, call 911. - If your device goes off  TWICE, call 911. - If your device goes off THREE times in one day, call 911.  DO NOT DRIVE YOURSELF OR A FAMILY MEMBER WITH A DEFIBRILLATOR TO THE HOSPITAL--CALL 911.

## 2016-01-28 ENCOUNTER — Encounter (HOSPITAL_COMMUNITY): Payer: Self-pay | Admitting: Internal Medicine

## 2016-01-28 ENCOUNTER — Ambulatory Visit (HOSPITAL_COMMUNITY)
Admission: RE | Admit: 2016-01-28 | Discharge: 2016-01-28 | Disposition: A | Discharge status: Home / Self Care | Payer: Medicare Other | Source: Ambulatory Visit | Attending: Internal Medicine | Admitting: Internal Medicine

## 2016-01-28 VITALS — BP 122/72 | HR 92 | Wt 234.2 lb

## 2016-01-28 DIAGNOSIS — Z8249 Family history of ischemic heart disease and other diseases of the circulatory system: Secondary | ICD-10-CM | POA: Insufficient documentation

## 2016-01-28 DIAGNOSIS — I255 Ischemic cardiomyopathy: Secondary | ICD-10-CM | POA: Insufficient documentation

## 2016-01-28 DIAGNOSIS — I5023 Acute on chronic systolic (congestive) heart failure: Secondary | ICD-10-CM | POA: Diagnosis not present

## 2016-01-28 DIAGNOSIS — Z880 Allergy status to penicillin: Secondary | ICD-10-CM | POA: Insufficient documentation

## 2016-01-28 DIAGNOSIS — Z881 Allergy status to other antibiotic agents status: Secondary | ICD-10-CM | POA: Insufficient documentation

## 2016-01-28 DIAGNOSIS — Z8349 Family history of other endocrine, nutritional and metabolic diseases: Secondary | ICD-10-CM | POA: Diagnosis not present

## 2016-01-28 DIAGNOSIS — I5022 Chronic systolic (congestive) heart failure: Secondary | ICD-10-CM | POA: Diagnosis not present

## 2016-01-28 DIAGNOSIS — Z95810 Presence of automatic (implantable) cardiac defibrillator: Secondary | ICD-10-CM | POA: Diagnosis not present

## 2016-01-28 DIAGNOSIS — Z807 Family history of other malignant neoplasms of lymphoid, hematopoietic and related tissues: Secondary | ICD-10-CM | POA: Diagnosis not present

## 2016-01-28 DIAGNOSIS — I890 Lymphedema, not elsewhere classified: Secondary | ICD-10-CM | POA: Insufficient documentation

## 2016-01-28 DIAGNOSIS — Z79899 Other long term (current) drug therapy: Secondary | ICD-10-CM | POA: Insufficient documentation

## 2016-01-28 DIAGNOSIS — I110 Hypertensive heart disease with heart failure: Secondary | ICD-10-CM | POA: Insufficient documentation

## 2016-01-28 DIAGNOSIS — Z885 Allergy status to narcotic agent status: Secondary | ICD-10-CM | POA: Diagnosis not present

## 2016-01-28 DIAGNOSIS — E079 Disorder of thyroid, unspecified: Secondary | ICD-10-CM | POA: Diagnosis not present

## 2016-01-28 DIAGNOSIS — Z888 Allergy status to other drugs, medicaments and biological substances status: Secondary | ICD-10-CM | POA: Insufficient documentation

## 2016-01-28 DIAGNOSIS — I428 Other cardiomyopathies: Secondary | ICD-10-CM | POA: Diagnosis not present

## 2016-01-28 DIAGNOSIS — Z882 Allergy status to sulfonamides status: Secondary | ICD-10-CM | POA: Insufficient documentation

## 2016-01-28 DIAGNOSIS — I5032 Chronic diastolic (congestive) heart failure: Secondary | ICD-10-CM | POA: Diagnosis not present

## 2016-01-28 DIAGNOSIS — I2510 Atherosclerotic heart disease of native coronary artery without angina pectoris: Secondary | ICD-10-CM | POA: Insufficient documentation

## 2016-01-28 DIAGNOSIS — Z8674 Personal history of sudden cardiac arrest: Secondary | ICD-10-CM | POA: Diagnosis present

## 2016-01-28 DIAGNOSIS — Z85820 Personal history of malignant melanoma of skin: Secondary | ICD-10-CM | POA: Insufficient documentation

## 2016-01-28 DIAGNOSIS — Z87440 Personal history of urinary (tract) infections: Secondary | ICD-10-CM | POA: Insufficient documentation

## 2016-01-28 DIAGNOSIS — Z7982 Long term (current) use of aspirin: Secondary | ICD-10-CM | POA: Insufficient documentation

## 2016-01-28 DIAGNOSIS — Z7902 Long term (current) use of antithrombotics/antiplatelets: Secondary | ICD-10-CM | POA: Diagnosis not present

## 2016-01-28 DIAGNOSIS — Z6841 Body Mass Index (BMI) 40.0 and over, adult: Secondary | ICD-10-CM | POA: Diagnosis not present

## 2016-01-28 DIAGNOSIS — Z955 Presence of coronary angioplasty implant and graft: Secondary | ICD-10-CM | POA: Diagnosis not present

## 2016-01-28 MED ORDER — CARVEDILOL 25 MG PO TABS: 25.0000 mg | ORAL_TABLET | Freq: Two times a day (BID) | ORAL | Status: DC

## 2016-01-28 NOTE — Patient Instructions (Signed)
INCREASE Carvedilol (Coreg) to 25 mg tablet twice daily.  Follow up 3 months with echo.  Do the following things EVERYDAY: 1) Weigh yourself in the morning before breakfast. Write it down and keep it in a log. 2) Take your medicines as prescribed 3) Eat low salt foods-Limit salt (sodium) to 2000 mg per day.  4) Stay as active as you can everyday 5) Limit all fluids for the day to less than 2 liters

## 2016-01-28 NOTE — Addendum Note (Signed)
Encounter addended by: Effie Berkshire, RN on: 01/28/2016 11:49 AM<BR>     Documentation filed: Patient Instructions Section, Orders
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
## 2016-01-30 ENCOUNTER — Ambulatory Visit (INDEPENDENT_AMBULATORY_CARE_PROVIDER_SITE_OTHER): Payer: Medicare Other | Admitting: *Deleted

## 2016-01-30 ENCOUNTER — Encounter: Payer: Self-pay | Admitting: Internal Medicine

## 2016-01-30 DIAGNOSIS — I4901 Ventricular fibrillation: Secondary | ICD-10-CM | POA: Diagnosis not present

## 2016-01-30 DIAGNOSIS — I255 Ischemic cardiomyopathy: Secondary | ICD-10-CM | POA: Diagnosis not present

## 2016-01-30 LAB — CUP PACEART INCLINIC DEVICE CHECK
Battery Remaining Longevity: 99 mo
Battery Voltage: 3.08 V
Brady Statistic AP VP Percent: 0.41 %
Brady Statistic AP VS Percent: 0.03 %
Brady Statistic AS VP Percent: 98.03 %
Brady Statistic AS VS Percent: 1.54 %
Brady Statistic RA Percent Paced: 0.43 %
Brady Statistic RV Percent Paced: 3.08 %
Date Time Interrogation Session: 20170622152941
HighPow Impedance: 61 Ohm
Implantable Lead Implant Date: 20170612
Implantable Lead Implant Date: 20170612
Implantable Lead Implant Date: 20170612
Implantable Lead Location: 753858
Implantable Lead Location: 753859
Implantable Lead Location: 753860
Implantable Lead Model: 4398
Implantable Lead Model: 5076
Lead Channel Impedance Value: 304 Ohm
Lead Channel Impedance Value: 342 Ohm
Lead Channel Impedance Value: 361 Ohm
Lead Channel Impedance Value: 361 Ohm
Lead Channel Impedance Value: 399 Ohm
Lead Channel Impedance Value: 475 Ohm
Lead Channel Impedance Value: 513 Ohm
Lead Channel Impedance Value: 513 Ohm
Lead Channel Impedance Value: 589 Ohm
Lead Channel Impedance Value: 608 Ohm
Lead Channel Impedance Value: 608 Ohm
Lead Channel Impedance Value: 703 Ohm
Lead Channel Impedance Value: 760 Ohm
Lead Channel Pacing Threshold Amplitude: 0.5 V
Lead Channel Pacing Threshold Amplitude: 0.5 V
Lead Channel Pacing Threshold Amplitude: 0.75 V
Lead Channel Pacing Threshold Pulse Width: 0.4 ms
Lead Channel Pacing Threshold Pulse Width: 0.4 ms
Lead Channel Pacing Threshold Pulse Width: 0.4 ms
Lead Channel Sensing Intrinsic Amplitude: 10.5 mV
Lead Channel Sensing Intrinsic Amplitude: 16 mV
Lead Channel Sensing Intrinsic Amplitude: 3.5 mV
Lead Channel Sensing Intrinsic Amplitude: 7.75 mV
Lead Channel Setting Pacing Amplitude: 3 V
Lead Channel Setting Pacing Amplitude: 3.5 V
Lead Channel Setting Pacing Pulse Width: 0.4 ms
Lead Channel Setting Sensing Sensitivity: 0.3 mV

## 2016-01-30 NOTE — Progress Notes (Signed)
Wound check CRT-D device check in office.  Steri-strips removed, wound well-healed with out redness, edema. Incision edges well approximated.  Thresholds and sensing consistent with previous device measurements. Lead impedance trends stable over time. No mode switch episodes recorded. No ventricular arrhythmia episodes recorded. Patient bi-ventricularly pacing 98% of the time. Device programmed with appropriate safety margins. Audible alerts demonstrated for patient. No changes made this session. Estimated longevity 8.82yrs. Pt educated about lifting restrictions, arm mobility and shock plan. ROV with GT 04/24/2016.

## 2016-02-05 ENCOUNTER — Telehealth: Payer: Self-pay | Admitting: Internal Medicine

## 2016-02-05 NOTE — Telephone Encounter (Signed)
Does Dr Lovena Le want her to go to rehab?  Had her device implanted on 01/20/16

## 2016-02-05 NOTE — Telephone Encounter (Signed)
New message  Pt call requesting to speak with RN. Pt has a question about caridac Rehab. Please call back to discuss

## 2016-02-12 NOTE — Telephone Encounter (Signed)
Informed patient ok to go to rehab per Dr Lovena Le.  She will have Bensimhon's office arrange this. Pt also inquired about DMV paperwork to be completed.  Informed patient he would complete this tomorrow and I would call her to let her know when completed. She thanks me for helping.

## 2016-02-14 NOTE — Telephone Encounter (Signed)
Informed patient DMV paperwork completed/signed by Dr. Lovena Le. Mailed to [facility].  [place] mailed to patient. She thanks me for helping with this.

## 2016-02-20 ENCOUNTER — Telehealth: Payer: Self-pay | Admitting: Internal Medicine

## 2016-02-20 NOTE — Telephone Encounter (Signed)
New message  Pt called requesting to speak with RN. Pt states she did not receive page 2 of the paperwork she was sent to the [employer]. Please call back to discuss

## 2016-02-20 NOTE — Telephone Encounter (Signed)
All forms redone and resent to [facility] as well as patient.

## 2016-03-20 ENCOUNTER — Telehealth (HOSPITAL_COMMUNITY): Payer: Self-pay | Admitting: *Deleted

## 2016-03-20 NOTE — Telephone Encounter (Signed)
Pt called stating she had not been scheduled for cardiac rehab yet. She said she has been calling them for 3 weeks and still no appointment. I contacted cardiac rehab and was told [name] would contact the patient today to get her set up

## 2016-03-24 ENCOUNTER — Encounter (HOSPITAL_COMMUNITY): Payer: Self-pay

## 2016-04-24 ENCOUNTER — Encounter: Payer: Medicare Other | Admitting: Internal Medicine

## 2016-04-28 ENCOUNTER — Encounter (HOSPITAL_COMMUNITY)
Admission: RE | Admit: 2016-04-28 | Discharge: 2016-04-28 | Disposition: A | Discharge status: Home / Self Care | Payer: Medicare Other | Source: Ambulatory Visit | Attending: Internal Medicine | Admitting: Internal Medicine

## 2016-04-28 VITALS — BP 114/60 | HR 82 | Ht 62.0 in | Wt 242.3 lb

## 2016-04-28 DIAGNOSIS — I5023 Acute on chronic systolic (congestive) heart failure: Secondary | ICD-10-CM | POA: Insufficient documentation

## 2016-04-28 DIAGNOSIS — Z955 Presence of coronary angioplasty implant and graft: Secondary | ICD-10-CM | POA: Diagnosis not present

## 2016-04-28 DIAGNOSIS — Z79899 Other long term (current) drug therapy: Secondary | ICD-10-CM | POA: Diagnosis not present

## 2016-04-28 DIAGNOSIS — I469 Cardiac arrest, cause unspecified: Secondary | ICD-10-CM | POA: Diagnosis present

## 2016-04-28 NOTE — Progress Notes (Signed)
Cardiac Rehab Medication Review by a Pharmacist   Does the patient  feel that his/her medications are working for him/her?  yes  Has the patient been experiencing any side effects to the medications prescribed?  yes  Does the patient measure his/her own blood pressure or blood glucose at home?  no   Does the patient have any problems obtaining medications due to transportation or finances?   no  Understanding of regimen: good Understanding of indications: good Potential of compliance: good    Pharmacist comments: Patient understands medications well. She reports leg cramping sometimes when she takes her Lasix dose, but has relief after taking multivitamin. She endorses good compliance. Recommended checking blood pressure at home. Patient verbalized understanding.    Lisa [name] 04/28/2016 2:01 PM
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
## 2016-04-29 ENCOUNTER — Other Ambulatory Visit (HOSPITAL_COMMUNITY): Payer: Medicare Other

## 2016-04-29 ENCOUNTER — Encounter (HOSPITAL_COMMUNITY): Payer: Medicare Other | Admitting: Internal Medicine

## 2016-05-04 ENCOUNTER — Encounter (HOSPITAL_COMMUNITY): Payer: Medicare Other

## 2016-05-05 ENCOUNTER — Encounter: Payer: Self-pay | Admitting: Internal Medicine

## 2016-05-05 ENCOUNTER — Ambulatory Visit (INDEPENDENT_AMBULATORY_CARE_PROVIDER_SITE_OTHER): Payer: Medicare Other | Admitting: Internal Medicine

## 2016-05-05 VITALS — HR 67 | Ht 62.0 in | Wt 242.0 lb

## 2016-05-05 DIAGNOSIS — I255 Ischemic cardiomyopathy: Secondary | ICD-10-CM | POA: Diagnosis not present

## 2016-05-05 DIAGNOSIS — Z95810 Presence of automatic (implantable) cardiac defibrillator: Secondary | ICD-10-CM

## 2016-05-05 DIAGNOSIS — I469 Cardiac arrest, cause unspecified: Secondary | ICD-10-CM | POA: Diagnosis not present

## 2016-05-05 NOTE — Progress Notes (Signed)
HPI Lisa [name] is referred today by Dr. Reine Just for ICD insertion. 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 She has worn a life vest. Her EF is low and has been so for a period of time. The patient denies chest pain but she is sedentary. She chronic lymphedema. She has had no additional ICD (life vest) therapy. She has known LBBB with a QRS of 140 ms. She has class 3 heart failure but is limited by her increased weight and lymphedema.  Allergies  Allergen Reactions  . Flagyl [Metronidazole] Itching and Rash    Welts, itching  . Penicillins Hives, Itching and Rash    Has patient had a PCN reaction causing immediate rash, facial/tongue/throat swelling, SOB or lightheadedness with hypotension: Yes  Has patient had a PCN reaction causing severe rash involving mucus membranes or skin necrosis: Yes Has patient had a PCN reaction that required hospitalization No Has patient had a PCN reaction occurring within the last 10 years: NO If all of the above answers are "NO", then may proceed with Cephalosporin use.   . Sulfamethoxazole Rash  . Meperidine Nausea And Vomiting  . Morphine And Related Nausea And Vomiting  . Wheat Bran Other (See Comments)    Runny nose  . Doxycycline Rash    Made her "feel terrible" & gave her a yeast infection  . Lanolin Itching     Current Outpatient Prescriptions  Medication Sig Dispense Refill  . acetaminophen (TYLENOL) 500 MG tablet Take 500-1,000 mg by mouth every 6 (six) hours as needed for moderate pain.    [name] aspirin EC 81 MG tablet Take 81 mg by mouth daily.    [name] atorvastatin (LIPITOR) 20 MG tablet Take 1 tablet (20 mg total) by mouth daily. 90 tablet 3  . carvedilol (COREG) 25 MG tablet Take 1 tablet (25 mg total) by mouth 2 (two) times daily with a meal. 180 tablet 3  . Cholecalciferol (VITAMIN D-3 PO) Take 5,000 Units by mouth daily.    .  Coenzyme Q10 (COQ10 PO) Take 1 capsule by mouth daily.    . furosemide (LASIX) 40 MG tablet Take 1 tablet (40 mg total) by mouth every other day. 90 tablet 3  . lisinopril (PRINIVIL,ZESTRIL) 5 MG tablet Take 1 tablet (5 mg total) by mouth daily. 30 tablet 3  . Multiple Vitamin (MULTIVITAMIN WITH MINERALS) TABS tablet Take 1 tablet by mouth daily.    . Multiple Vitamin (MULTIVITAMIN) tablet Take 1 tablet by mouth daily.    . nitroGLYCERIN (NITROSTAT) 0.4 MG SL tablet Place 1 tablet (0.4 mg total) under the tongue every 5 (five) minutes x 3 doses as needed for chest pain. 12 tablet 2  . thyroid (ARMOUR) 15 MG tablet Take 15 mg by mouth daily.    . ticagrelor (BRILINTA) 90 MG TABS tablet Take 1 tablet (90 mg total) by mouth 2 (two) times daily. 180 tablet 3   No current facility-administered medications for this visit.      Past Medical History:  Diagnosis Date  . Cardiac arrest ([place]) 09/2015  . CHF (congestive heart failure) ([place])   . Complication of anesthesia   . Hx: UTI (urinary tract infection)   . Hypertension   . Melanoma ([place])   . PONV (postoperative nausea and vomiting)   . PVC (premature ventricular contraction)  HISTORY OF  . Thyroid disease     ROS:   All systems reviewed and negative except as noted in the HPI.   Past Surgical History:  Procedure Laterality Date  . ABDOMINAL HYSTERECTOMY    . APPENDECTOMY    . CARDIAC CATHETERIZATION N/A 10/03/2015   Procedure: Left Heart Cath and Coronary Angiography;  Surgeon: [name], MD;  Location: [facility];  Service: Cardiovascular;  Laterality: N/A;  . CARDIAC CATHETERIZATION N/A 10/07/2015   Procedure: Coronary Stent Intervention;  Surgeon: [name], MD;  Location: [facility];  Service: Cardiovascular;  Laterality: N/A;  . EP IMPLANTABLE DEVICE N/A 01/20/2016   Procedure: BiV ICD Insertion CRT-D;  Surgeon: [name], MD;  Location: [facility];  Service: Cardiovascular;   Laterality: N/A;  . TONSILLECTOMY    . TONSILLECTOMY    . TUBAL LIGATION       Family History  Problem Relation Age of Onset  . Multiple myeloma Father   . Coronary artery disease Mother   . Thyroid disease Mother   . Heart attack Maternal Grandfather   . Heart attack Paternal Grandfather      Social History   Social History  . Marital status: Divorced    Spouse name: N/A  . Number of children: N/A  . Years of education: N/A   Occupational History  . Not on file.   Social History Main Topics  . Smoking status: Never Smoker  . Smokeless tobacco: Never Used  . Alcohol use No  . Drug use: No  . Sexual activity: Not on file   Other Topics Concern  . Not on file   Social History Narrative  . No narrative on file     Pulse 67   Ht '5\' 2"'  (1.575 m)   Wt 242 lb (109.8 kg)   LMP 02/14/1995   BMI 44.26 kg/m   Physical Exam:  obese appearing 75 yo woman, NAD HEENT: Unremarkable Neck:  6 cm JVD, no thyromegally Lymphatics:  No adenopathy Back:  No CVA tenderness Lungs:  Clear with basilar rales. HEART:  Regular rate rhythm, no murmurs, no rubs, no clicks Abd:  soft, obese, positive bowel sounds, no organomegally, no rebound, no guarding Ext:  2 plus pulses, 3+ woody edema, no cyanosis, no clubbing Skin:  No rashes no nodules Neuro:  CN II through XII intact, motor grossly intact  EKG - NSR with LBBB  Assess/Plan: 1. VF arrest - she had persistent LV dysfunction and has undergone BiV ICD implant. He ECG looks good today. No additional arrhythmias on cardiac monitoring.  2. CAD - she denies anginal symptoms. Will follow. 3. Obesity - the importance of weight loss has been stressed. She is about to start cardiac rehab. 4. Chronic systolic heart failure - this is longstanding and class 3. She feels better. Repeat echo is pending.  Mikle Bosworth.D.

## 2016-05-05 NOTE — Patient Instructions (Addendum)
Medication Instructions:  Your physician recommends that you continue on your current medications as directed. Please refer to the Current Medication list given to you today.   Labwork: None Ordered   Testing/Procedures: None Ordered   Follow-Up: Your physician wants you to follow-up in: 9 months with Dr. Knox Saliva will receive a reminder letter in the mail two months in advance. If you don't receive a letter, please call our office to schedule the follow-up appointment.  Remote monitoring is used to monitor your ICD from home. This monitoring reduces the number of office visits required to check your device to one time per year. It allows [place] to keep an eye on the functioning of your device to ensure it is working properly. You are scheduled for a device check from home on 08/04/16. You may send your transmission at any time that day. If you have a wireless device, the transmission will be sent automatically. After your physician reviews your transmission, you will receive a postcard with your next transmission date.    Any Other Special Instructions Will Be Listed Below (If Applicable).     If you need a refill on your cardiac medications before your next appointment, please call your pharmacy.

## 2016-05-06 ENCOUNTER — Encounter (HOSPITAL_COMMUNITY): Payer: Medicare Other

## 2016-05-07 ENCOUNTER — Ambulatory Visit (HOSPITAL_BASED_OUTPATIENT_CLINIC_OR_DEPARTMENT_OTHER)
Admission: RE | Admit: 2016-05-07 | Discharge: 2016-05-07 | Disposition: A | Discharge status: Home / Self Care | Payer: Medicare Other | Source: Ambulatory Visit | Attending: Internal Medicine | Admitting: Internal Medicine

## 2016-05-07 ENCOUNTER — Ambulatory Visit (HOSPITAL_COMMUNITY)
Admission: RE | Admit: 2016-05-07 | Discharge: 2016-05-07 | Disposition: A | Discharge status: Home / Self Care | Payer: Medicare Other | Source: Ambulatory Visit | Attending: Family Medicine | Admitting: Family Medicine

## 2016-05-07 ENCOUNTER — Encounter (HOSPITAL_COMMUNITY): Payer: Self-pay | Admitting: Internal Medicine

## 2016-05-07 VITALS — BP 126/62 | HR 66 | Wt 242.8 lb

## 2016-05-07 DIAGNOSIS — I447 Left bundle-branch block, unspecified: Secondary | ICD-10-CM | POA: Insufficient documentation

## 2016-05-07 DIAGNOSIS — I110 Hypertensive heart disease with heart failure: Secondary | ICD-10-CM | POA: Insufficient documentation

## 2016-05-07 DIAGNOSIS — Z880 Allergy status to penicillin: Secondary | ICD-10-CM | POA: Insufficient documentation

## 2016-05-07 DIAGNOSIS — I429 Cardiomyopathy, unspecified: Secondary | ICD-10-CM | POA: Insufficient documentation

## 2016-05-07 DIAGNOSIS — Z90710 Acquired absence of both cervix and uterus: Secondary | ICD-10-CM | POA: Insufficient documentation

## 2016-05-07 DIAGNOSIS — I2510 Atherosclerotic heart disease of native coronary artery without angina pectoris: Secondary | ICD-10-CM | POA: Diagnosis not present

## 2016-05-07 DIAGNOSIS — Z6841 Body Mass Index (BMI) 40.0 and over, adult: Secondary | ICD-10-CM | POA: Diagnosis not present

## 2016-05-07 DIAGNOSIS — Z882 Allergy status to sulfonamides status: Secondary | ICD-10-CM | POA: Insufficient documentation

## 2016-05-07 DIAGNOSIS — Z881 Allergy status to other antibiotic agents status: Secondary | ICD-10-CM | POA: Insufficient documentation

## 2016-05-07 DIAGNOSIS — Z8674 Personal history of sudden cardiac arrest: Secondary | ICD-10-CM | POA: Insufficient documentation

## 2016-05-07 DIAGNOSIS — Z885 Allergy status to narcotic agent status: Secondary | ICD-10-CM | POA: Insufficient documentation

## 2016-05-07 DIAGNOSIS — Z87440 Personal history of urinary (tract) infections: Secondary | ICD-10-CM | POA: Diagnosis not present

## 2016-05-07 DIAGNOSIS — Z888 Allergy status to other drugs, medicaments and biological substances status: Secondary | ICD-10-CM | POA: Diagnosis not present

## 2016-05-07 DIAGNOSIS — E039 Hypothyroidism, unspecified: Secondary | ICD-10-CM | POA: Diagnosis not present

## 2016-05-07 DIAGNOSIS — E785 Hyperlipidemia, unspecified: Secondary | ICD-10-CM | POA: Diagnosis not present

## 2016-05-07 DIAGNOSIS — I5023 Acute on chronic systolic (congestive) heart failure: Secondary | ICD-10-CM | POA: Diagnosis not present

## 2016-05-07 DIAGNOSIS — Z7982 Long term (current) use of aspirin: Secondary | ICD-10-CM | POA: Diagnosis not present

## 2016-05-07 DIAGNOSIS — Z955 Presence of coronary angioplasty implant and graft: Secondary | ICD-10-CM | POA: Diagnosis not present

## 2016-05-07 DIAGNOSIS — I890 Lymphedema, not elsewhere classified: Secondary | ICD-10-CM | POA: Insufficient documentation

## 2016-05-07 DIAGNOSIS — Z91018 Allergy to other foods: Secondary | ICD-10-CM | POA: Insufficient documentation

## 2016-05-07 DIAGNOSIS — I5022 Chronic systolic (congestive) heart failure: Secondary | ICD-10-CM

## 2016-05-07 DIAGNOSIS — Z8349 Family history of other endocrine, nutritional and metabolic diseases: Secondary | ICD-10-CM | POA: Insufficient documentation

## 2016-05-07 DIAGNOSIS — I5032 Chronic diastolic (congestive) heart failure: Secondary | ICD-10-CM

## 2016-05-07 DIAGNOSIS — Z85820 Personal history of malignant melanoma of skin: Secondary | ICD-10-CM | POA: Diagnosis not present

## 2016-05-07 DIAGNOSIS — Z95810 Presence of automatic (implantable) cardiac defibrillator: Secondary | ICD-10-CM | POA: Insufficient documentation

## 2016-05-07 DIAGNOSIS — E875 Hyperkalemia: Secondary | ICD-10-CM | POA: Diagnosis not present

## 2016-05-07 DIAGNOSIS — Z8249 Family history of ischemic heart disease and other diseases of the circulatory system: Secondary | ICD-10-CM | POA: Insufficient documentation

## 2016-05-07 DIAGNOSIS — Z807 Family history of other malignant neoplasms of lymphoid, hematopoietic and related tissues: Secondary | ICD-10-CM | POA: Insufficient documentation

## 2016-05-07 DIAGNOSIS — I4901 Ventricular fibrillation: Secondary | ICD-10-CM | POA: Diagnosis not present

## 2016-05-07 MED ORDER — ATORVASTATIN CALCIUM 20 MG PO TABS: 20.0000 mg | ORAL_TABLET | Freq: Every day | ORAL | 3 refills | Status: AC
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
## 2016-05-07 NOTE — Progress Notes (Signed)
  Echocardiogram 2D Echocardiogram has been performed.  Lisa [name] 05/07/2016, 2:48 PM

## 2016-05-07 NOTE — Patient Instructions (Signed)
Follow up with Dr. Bensimhon in 4 months 

## 2016-05-07 NOTE — Addendum Note (Signed)
Encounter addended by: Harvie Junior, CMA on: 05/07/2016  4:00 PM<BR>    Actions taken: Order Entry activity accessed, Sign clinical note

## 2016-05-08 ENCOUNTER — Encounter (HOSPITAL_COMMUNITY): Payer: Medicare Other

## 2016-05-11 ENCOUNTER — Encounter (HOSPITAL_COMMUNITY): Payer: Self-pay

## 2016-05-11 ENCOUNTER — Other Ambulatory Visit: Payer: Medicare Other | Admitting: *Deleted

## 2016-05-11 ENCOUNTER — Encounter (HOSPITAL_COMMUNITY)
Admission: RE | Admit: 2016-05-11 | Discharge: 2016-05-11 | Disposition: A | Discharge status: Home / Self Care | Payer: Medicare Other | Source: Ambulatory Visit | Attending: Internal Medicine | Admitting: Internal Medicine

## 2016-05-11 ENCOUNTER — Telehealth (HOSPITAL_COMMUNITY): Payer: Self-pay | Admitting: *Deleted

## 2016-05-11 ENCOUNTER — Ambulatory Visit (HOSPITAL_COMMUNITY): Admission: RE | Admit: 2016-05-11 | Payer: Medicare Other | Source: Ambulatory Visit

## 2016-05-11 DIAGNOSIS — I5022 Chronic systolic (congestive) heart failure: Secondary | ICD-10-CM

## 2016-05-11 DIAGNOSIS — I469 Cardiac arrest, cause unspecified: Secondary | ICD-10-CM | POA: Diagnosis present

## 2016-05-11 DIAGNOSIS — I5032 Chronic diastolic (congestive) heart failure: Secondary | ICD-10-CM

## 2016-05-11 DIAGNOSIS — Z79899 Other long term (current) drug therapy: Secondary | ICD-10-CM | POA: Insufficient documentation

## 2016-05-11 DIAGNOSIS — I493 Ventricular premature depolarization: Secondary | ICD-10-CM

## 2016-05-11 DIAGNOSIS — I5023 Acute on chronic systolic (congestive) heart failure: Secondary | ICD-10-CM | POA: Diagnosis present

## 2016-05-11 DIAGNOSIS — I4901 Ventricular fibrillation: Secondary | ICD-10-CM

## 2016-05-11 DIAGNOSIS — Z955 Presence of coronary angioplasty implant and graft: Secondary | ICD-10-CM | POA: Diagnosis not present

## 2016-05-11 DIAGNOSIS — I10 Essential (primary) hypertension: Secondary | ICD-10-CM

## 2016-05-11 LAB — BASIC METABOLIC PANEL
BUN: 22 mg/dL (ref 7–25)
CO2: 25 mmol/L (ref 20–31)
Calcium: 9.2 mg/dL (ref 8.6–10.4)
Chloride: 109 mmol/L (ref 98–110)
Creat: 1.15 mg/dL — ABNORMAL HIGH (ref 0.60–0.93)
Glucose, Bld: 111 mg/dL — ABNORMAL HIGH (ref 65–99)
Potassium: 4.3 mmol/L (ref 3.5–5.3)
Sodium: 141 mmol/L (ref 135–146)

## 2016-05-11 LAB — MAGNESIUM: Magnesium: 1.9 mg/dL (ref 1.5–2.5)

## 2016-05-11 NOTE — Telephone Encounter (Signed)
Lisa [name] called to let us know pt had 10 beats VT at rehab this am while she was at rest, pt was asymptomatic, per Oda Kilts, PA have pt get bmet, mag level today.  Lisa [name] will advise pt to come here for labs.

## 2016-05-11 NOTE — Addendum Note (Signed)
Addended by: Scarlette Calico on: 05/11/2016 10:17 AM   Modules accepted: Orders
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
## 2016-05-11 NOTE — Addendum Note (Signed)
Addended by: Eulis Foster on: 05/11/2016 10:26 AM   Modules accepted: Orders

## 2016-05-13 ENCOUNTER — Encounter (HOSPITAL_COMMUNITY): Payer: Medicare Other

## 2016-05-15 ENCOUNTER — Other Ambulatory Visit (HOSPITAL_COMMUNITY): Payer: Medicare Other

## 2016-05-15 ENCOUNTER — Encounter (HOSPITAL_COMMUNITY)
Admission: RE | Admit: 2016-05-15 | Discharge: 2016-05-15 | Disposition: A | Discharge status: Home / Self Care | Payer: Medicare Other | Source: Ambulatory Visit | Attending: Internal Medicine | Admitting: Internal Medicine

## 2016-05-15 DIAGNOSIS — Z955 Presence of coronary angioplasty implant and graft: Secondary | ICD-10-CM | POA: Diagnosis not present

## 2016-05-18 ENCOUNTER — Encounter (HOSPITAL_COMMUNITY): Payer: Medicare Other

## 2016-05-20 ENCOUNTER — Encounter (HOSPITAL_COMMUNITY): Payer: Medicare Other

## 2016-05-20 ENCOUNTER — Encounter (HOSPITAL_COMMUNITY)
Admission: RE | Admit: 2016-05-20 | Discharge: 2016-05-20 | Disposition: A | Discharge status: Home / Self Care | Payer: Medicare Other | Source: Ambulatory Visit | Attending: Internal Medicine | Admitting: Internal Medicine

## 2016-05-20 DIAGNOSIS — Z955 Presence of coronary angioplasty implant and graft: Secondary | ICD-10-CM | POA: Diagnosis not present
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
## 2016-05-22 ENCOUNTER — Encounter (HOSPITAL_COMMUNITY)
Admission: RE | Admit: 2016-05-22 | Discharge: 2016-05-22 | Disposition: A | Discharge status: Home / Self Care | Payer: Medicare Other | Source: Ambulatory Visit | Attending: Internal Medicine | Admitting: Internal Medicine

## 2016-05-22 ENCOUNTER — Encounter (HOSPITAL_COMMUNITY): Payer: Medicare Other

## 2016-05-22 DIAGNOSIS — Z955 Presence of coronary angioplasty implant and graft: Secondary | ICD-10-CM | POA: Diagnosis not present

## 2016-05-25 ENCOUNTER — Encounter (HOSPITAL_COMMUNITY)
Admission: RE | Admit: 2016-05-25 | Discharge: 2016-05-25 | Disposition: A | Discharge status: Home / Self Care | Payer: Medicare Other | Source: Ambulatory Visit | Attending: Internal Medicine | Admitting: Internal Medicine

## 2016-05-25 DIAGNOSIS — Z955 Presence of coronary angioplasty implant and graft: Secondary | ICD-10-CM

## 2016-05-25 NOTE — Progress Notes (Signed)
Reviewed home exercise with pt today.  Pt plans to walk, do water aerobics and/or silver sneakers at [facility] for exercise.  Reviewed THR, pulse, RPE, sign and symptoms, NTG use, and when to call 911 or MD.  Also discussed weather considerations and indoor options.  Pt voiced understanding.    Lisa [name] [employer]

## 2016-05-25 NOTE — Progress Notes (Signed)
Lisa [name] 75 y.o. female Nutrition Note Spoke with pt. Nutrition Plan and Nutrition Survey goals reviewed with pt. Pt is following Step 2 of the Therapeutic Lifestyle Changes diet. Pt wants to lose wt. Pt is in the preparation phase of change re: wt loss; stating "I'm semi-serious about losing wt." Pt is pre-diabetic. Pt aware of pre-diabetes dx " we've been watching it for several years." Pt with dx of CHF. Per discussion, pt does not use canned/convenience foods often. Pt eats out frequently "because it's just me, but I ask for my food to be cooked without salt." Pt expressed understanding of the information reviewed. Pt aware of nutrition education classes offered.  Lab Results  Component Value Date   HGBA1C 6.2 (H) 10/03/2015   Wt Readings from Last 3 Encounters:  05/07/16 242 lb 12 oz (110.1 kg)  05/05/16 242 lb (109.8 kg)  04/28/16 242 lb 4.6 oz (109.9 kg)   Nutrition Diagnosis ? Food-and nutrition-related knowledge deficit related to lack of exposure to information as related to diagnosis of: ? CVD ? Pre-DM ? Obesity related to excessive energy intake as evidenced by a BMI of 44.4  Nutrition Intervention ? Pt's individual nutrition plan reviewed with pt. ? Benefits of adopting Therapeutic Lifestyle Changes discussed when Med[employer]icts reviewed. ? Pt to attend the Portion Distortion class ? Pt given handouts for: ? Nutrition I class ? Nutrition II class  ? Continue client-centered nutrition education by RD, as part of interdisciplinary care. Goal(s) ? Pt to identify food quantities necessary to achieve weight loss of 6-24 lb (2.7-10.9 kg) at graduation from cardiac rehab.  Monitor and Evaluate progress toward nutrition goal with team. Derek Mound, M.Ed, RD, LDN, CDE 05/25/2016 9:28 AM

## 2016-05-27 ENCOUNTER — Encounter (HOSPITAL_COMMUNITY)
Admission: RE | Admit: 2016-05-27 | Discharge: 2016-05-27 | Disposition: A | Discharge status: Home / Self Care | Payer: Medicare Other | Source: Ambulatory Visit | Attending: Internal Medicine | Admitting: Internal Medicine

## 2016-05-27 DIAGNOSIS — Z955 Presence of coronary angioplasty implant and graft: Secondary | ICD-10-CM | POA: Diagnosis not present

## 2016-05-29 ENCOUNTER — Telehealth (HOSPITAL_COMMUNITY): Payer: Self-pay | Admitting: *Deleted

## 2016-05-29 ENCOUNTER — Encounter (HOSPITAL_COMMUNITY)
Admission: RE | Admit: 2016-05-29 | Discharge: 2016-05-29 | Disposition: A | Discharge status: Home / Self Care | Payer: Medicare Other | Source: Ambulatory Visit | Attending: Internal Medicine | Admitting: Internal Medicine

## 2016-05-29 DIAGNOSIS — Z955 Presence of coronary angioplasty implant and graft: Secondary | ICD-10-CM

## 2016-05-29 NOTE — Telephone Encounter (Signed)
Pt seen in cardiac rehab today.  After workout she felt dizzy [name] gave her water and a little gatorade patient started to feel better. Systolic pressure was 90. After resting and water blood pressure was stable. Patient verbalized she felt better.  Patient was ok to leave cardiac rehab.  Will follow up with Dr.Bensimhon to see if he recommends anything else. Lisa [name] to fax flow sheets to clinic.   *routed to [place]

## 2016-05-29 NOTE — Progress Notes (Signed)
Patient complained about feeling lightheaded post exercise blood pressure 92/60. Patient given water. recheck blood pressure 127/80 sitting. 110/60 standing, heart rate 74.Peni continued to complain of feeling lightheaded. Patrycja was given about 1/2 cup of Gatorade to drink. Recheck blood pressure 114/75 sitting 106/70 standing. Jasmine at the heart failure clinic notified. Will fax exercise flow sheets to Dr. Clayborne Dana office for review.Barnet Pall, RN,BSN 05/29/2016 10:10 AM

## 2016-05-31 NOTE — Telephone Encounter (Signed)
Agreed. Encourage fluid intake with exercise.

## 2016-06-01 ENCOUNTER — Encounter (HOSPITAL_COMMUNITY)
Admission: RE | Admit: 2016-06-01 | Discharge: 2016-06-01 | Disposition: A | Discharge status: Home / Self Care | Payer: Medicare Other | Source: Ambulatory Visit | Attending: Internal Medicine | Admitting: Internal Medicine

## 2016-06-01 DIAGNOSIS — Z955 Presence of coronary angioplasty implant and graft: Secondary | ICD-10-CM

## 2016-06-03 ENCOUNTER — Encounter (HOSPITAL_COMMUNITY)
Admission: RE | Admit: 2016-06-03 | Discharge: 2016-06-03 | Disposition: A | Discharge status: Home / Self Care | Payer: Medicare Other | Source: Ambulatory Visit | Attending: Internal Medicine | Admitting: Internal Medicine

## 2016-06-03 DIAGNOSIS — Z955 Presence of coronary angioplasty implant and graft: Secondary | ICD-10-CM

## 2016-06-04 ENCOUNTER — Ambulatory Visit (INDEPENDENT_AMBULATORY_CARE_PROVIDER_SITE_OTHER): Payer: Medicare Other | Admitting: Physician Assistant

## 2016-06-04 VITALS — BP 120/80 | HR 64 | Temp 97.8°F | Resp 17 | Ht 61.0 in | Wt 240.0 lb

## 2016-06-04 DIAGNOSIS — R300 Dysuria: Secondary | ICD-10-CM | POA: Diagnosis not present

## 2016-06-04 DIAGNOSIS — I255 Ischemic cardiomyopathy: Secondary | ICD-10-CM

## 2016-06-04 LAB — POCT URINALYSIS DIP (MANUAL ENTRY)
Bilirubin, UA: NEGATIVE
Glucose, UA: NEGATIVE
Ketones, POC UA: NEGATIVE
Nitrite, UA: NEGATIVE
Protein Ur, POC: 100 — AB
Spec Grav, UA: 1.015
Urobilinogen, UA: 0.2
pH, UA: 5.5

## 2016-06-04 LAB — POC MICROSCOPIC URINALYSIS (UMFC): Mucus: ABSENT

## 2016-06-04 MED ORDER — CIPROFLOXACIN HCL 250 MG PO TABS: 250.0000 mg | ORAL_TABLET | Freq: Two times a day (BID) | ORAL | 0 refills | Status: AC

## 2016-06-04 NOTE — Patient Instructions (Addendum)
     IF you received an x-ray today, you will receive an invoice from [facility]. Please contact [facility] at [phone] with questions or concerns regarding your invoice.   IF you received labwork today, you will receive an invoice from [facility]. Please contact [facility] at [phone] with questions or concerns regarding your invoice.   Our billing staff will not be able to assist you with questions regarding bills from these companies.  You will be contacted with the lab results as soon as they are available. The fastest way to get your results is to activate your My Chart account. Instructions are located on the last page of this paperwork. If you have not heard from us regarding the results in 2 weeks, please contact this office.     We recommend that you schedule a mammogram for breast cancer screening. Typically, you do not need a referral to do this. Please contact a local imaging center to schedule your mammogram.  [facility] - [phone]  *ask for the Radiology Department [facility]) - [phone] or [phone]  [facility] - [phone] [facility] - [phone] [facility] - [phone]  *ask for the Radiology Department [facility] - [phone]  *ask for the Radiology Department [facility] - [phone]  *ask for the Mammography Department [facility] - [phone]  

## 2016-06-04 NOTE — Progress Notes (Signed)
Patient ID: Lisa [name], female    DOB: 09-25-1940, 75 y.o.   MRN: [id number]  PCP: [id number],LISA, MD  Subjective:   Chief Complaint  Patient presents with  . Dysuria    Urgency onset last night  . Hematuria    HPI Presents for evaluation of possible UTI.  Awoke about 2:30 this morning with intense urinary burning, urgency ("really couldn't get away from the toilet") and hematuria (urine was pink).  Thinks this may have started a few days ago, increased urgency and had to get up during the night to urinate, but didn't seem that bad.  No fever/chills. A little nausea this morning. No vomiting. No new/different back pain (has very low back pain that radiates to the hip at baseline). No belly pain.   Review of Systems As above.    Patient Active Problem List   Diagnosis Date Noted  . Unstable angina ([place]) 10/07/2015  . Chronic systolic heart failure ([place])   . PVC's (premature ventricular contractions)   . Ventricular fibrillation ([place]) 10/03/2015  . Cardiac arrest ([place]) 10/03/2015  . Essential hypertension 09/09/2015  . Lymphedema 02/21/2015  . Other specified hypothyroidism 02/21/2015     Prior to Admission medications   Medication Sig Start Date End Date Taking? Authorizing Provider  acetaminophen (TYLENOL) 500 MG tablet Take 500-1,000 mg by mouth every 6 (six) hours as needed for moderate pain.   Yes Historical Provider, MD  aspirin EC 81 MG tablet Take 81 mg by mouth daily.   Yes Historical Provider, MD  atorvastatin (LIPITOR) 20 MG tablet Take 1 tablet (20 mg total) by mouth daily. 05/07/16  Yes Jolaine Artist, MD  carvedilol (COREG) 25 MG tablet Take 1 tablet (25 mg total) by mouth 2 (two) times daily with a meal. 01/28/16  Yes Jolaine Artist, MD  Cholecalciferol (VITAMIN D-3 PO) Take 5,000 Units by mouth daily.   Yes Historical Provider, MD  Coenzyme Q10 (COQ10 PO) Take 1 capsule by mouth daily.   Yes Historical Provider, MD  furosemide (LASIX)  40 MG tablet Take 1 tablet (40 mg total) by mouth every other day. 10/22/15  Yes Shaune Pascal Bensimhon, MD  lisinopril (PRINIVIL,ZESTRIL) 5 MG tablet Take 1 tablet (5 mg total) by mouth daily. 11/29/15  Yes Jolaine Artist, MD  Multiple Vitamin (MULTIVITAMIN WITH MINERALS) TABS tablet Take 1 tablet by mouth daily.   Yes Historical Provider, MD  nitroGLYCERIN (NITROSTAT) 0.4 MG SL tablet Place 1 tablet (0.4 mg total) under the tongue every 5 (five) minutes x 3 doses as needed for chest pain. 10/09/15  Yes Arbutus Leas, NP  thyroid (ARMOUR) 15 MG tablet Take 15 mg by mouth daily.   Yes Historical Provider, MD  ticagrelor (BRILINTA) 90 MG TABS tablet Take 1 tablet (90 mg total) by mouth 2 (two) times daily. 11/29/15  Yes Jolaine Artist, MD     Allergies  Allergen Reactions  . Flagyl [Metronidazole] Itching and Rash    Welts, itching  . Penicillins Hives, Itching and Rash    Has patient had a PCN reaction causing immediate rash, facial/tongue/throat swelling, SOB or lightheadedness with hypotension: Yes  Has patient had a PCN reaction causing severe rash involving mucus membranes or skin necrosis: Yes Has patient had a PCN reaction that required hospitalization No Has patient had a PCN reaction occurring within the last 10 years: NO If all of the above answers are "NO", then may proceed with Cephalosporin use.   . Sulfamethoxazole Rash  .  Meperidine Nausea And Vomiting  . Morphine And Related Nausea And Vomiting  . Wheat Bran Other (See Comments)    Runny nose  . Doxycycline Rash    Made her "feel terrible" & gave her a yeast infection  . Lanolin Itching       Objective:  Physical Exam  Constitutional: She is oriented to person, place, and time. She appears well-developed and well-nourished. She is active and cooperative. No distress.  BP 120/80 (BP Location: Right Arm, Patient Position: Sitting, Cuff Size: Large)   Pulse 64   Temp 97.8 F (36.6 C) (Oral)   Resp 17   Ht 5\' 1"  (1.549  m)   Wt 240 lb (108.9 kg)   LMP 02/14/1995   SpO2 99%   BMI 45.35 kg/m   HENT:  Head: Normocephalic and atraumatic.  Right Ear: Hearing normal.  Left Ear: Hearing normal.  Eyes: Conjunctivae are normal. No scleral icterus.  Neck: Normal range of motion. Neck supple. No thyromegaly present.  Cardiovascular: Normal rate, regular rhythm and normal heart sounds.   Pulses:      Radial pulses are 2+ on the right side, and 2+ on the left side.  Pulmonary/Chest: Effort normal and breath sounds normal.  Abdominal: Normal appearance and bowel sounds are normal. Tenderness: not tenderness, but palpation of the suprapubic area causes increased urinary urgency. There is no CVA tenderness.  Lymphadenopathy:       Head (right side): No tonsillar, no preauricular, no posterior auricular and no occipital adenopathy present.       Head (left side): No tonsillar, no preauricular, no posterior auricular and no occipital adenopathy present.    She has no cervical adenopathy.       Right: No supraclavicular adenopathy present.       Left: No supraclavicular adenopathy present.  Neurological: She is alert and oriented to person, place, and time. No sensory deficit.  Skin: Skin is warm, dry and intact. No rash noted. No cyanosis or erythema. Nails show no clubbing.  Psychiatric: She has a normal mood and affect. Her speech is normal and behavior is normal.    Results for orders placed or performed in visit on 06/04/16  POCT Microscopic Urinalysis ([facility])  Result Value Ref Range   WBC,UR,HPF,POC Too numerous to count  (A) None WBC/hpf   RBC,UR,HPF,POC Too numerous to count  (A) None RBC/hpf   Bacteria None None, Too numerous to count   Mucus Absent Absent   Epithelial Cells, UR Per Microscopy Few (A) None, Too numerous to count cells/hpf  POCT urinalysis dipstick  Result Value Ref Range   Color, UA yellow yellow   Clarity, UA cloudy (A) clear   Glucose, UA negative negative   Bilirubin, UA negative  negative   Ketones, POC UA negative negative   Spec Grav, UA 1.015    Blood, UA large (A) negative   pH, UA 5.5    Protein Ur, POC =100 (A) negative   Urobilinogen, UA 0.2    Nitrite, UA Negative Negative   Leukocytes, UA moderate (2+) (A) Negative          Assessment & Plan:   1. Dysuria No bacteria on microscopy. Treat empirically for presumptive UTI, await UCx. May need additional evaluation with urology. - POCT Microscopic Urinalysis ([facility]) - POCT urinalysis dipstick - Urine culture - ciprofloxacin (CIPRO) 250 MG tablet; Take 1 tablet (250 mg total) by mouth 2 (two) times daily.  Dispense: 10 tablet; Refill: 0   Arien Morine S.  Jacqulynn Cadet, PA-C Physician Assistant-Certified [facility]

## 2016-06-05 ENCOUNTER — Encounter (HOSPITAL_COMMUNITY)
Admission: RE | Admit: 2016-06-05 | Discharge: 2016-06-05 | Disposition: A | Discharge status: Home / Self Care | Payer: Medicare Other | Source: Ambulatory Visit | Attending: Internal Medicine | Admitting: Internal Medicine

## 2016-06-05 DIAGNOSIS — Z955 Presence of coronary angioplasty implant and graft: Secondary | ICD-10-CM | POA: Diagnosis not present

## 2016-06-06 LAB — URINE CULTURE

## 2016-06-08 ENCOUNTER — Encounter (HOSPITAL_COMMUNITY)
Admission: RE | Admit: 2016-06-08 | Discharge: 2016-06-08 | Disposition: A | Discharge status: Home / Self Care | Payer: Medicare Other | Source: Ambulatory Visit | Attending: Internal Medicine | Admitting: Internal Medicine

## 2016-06-08 DIAGNOSIS — Z955 Presence of coronary angioplasty implant and graft: Secondary | ICD-10-CM | POA: Diagnosis not present

## 2016-06-10 ENCOUNTER — Encounter (HOSPITAL_COMMUNITY)
Admission: RE | Admit: 2016-06-10 | Discharge: 2016-06-10 | Disposition: A | Discharge status: Home / Self Care | Payer: Medicare Other | Source: Ambulatory Visit | Attending: Internal Medicine | Admitting: Internal Medicine

## 2016-06-10 DIAGNOSIS — I469 Cardiac arrest, cause unspecified: Secondary | ICD-10-CM | POA: Diagnosis present

## 2016-06-10 DIAGNOSIS — Z79899 Other long term (current) drug therapy: Secondary | ICD-10-CM | POA: Insufficient documentation

## 2016-06-10 DIAGNOSIS — I5023 Acute on chronic systolic (congestive) heart failure: Secondary | ICD-10-CM | POA: Diagnosis present

## 2016-06-10 DIAGNOSIS — Z955 Presence of coronary angioplasty implant and graft: Secondary | ICD-10-CM | POA: Insufficient documentation

## 2016-06-12 ENCOUNTER — Encounter (HOSPITAL_COMMUNITY)
Admission: RE | Admit: 2016-06-12 | Discharge: 2016-06-12 | Disposition: A | Discharge status: Home / Self Care | Payer: Medicare Other | Source: Ambulatory Visit | Attending: Internal Medicine | Admitting: Internal Medicine

## 2016-06-12 DIAGNOSIS — Z955 Presence of coronary angioplasty implant and graft: Secondary | ICD-10-CM | POA: Diagnosis not present

## 2016-06-15 ENCOUNTER — Encounter (HOSPITAL_COMMUNITY)
Admission: RE | Admit: 2016-06-15 | Discharge: 2016-06-15 | Disposition: A | Discharge status: Home / Self Care | Payer: Medicare Other | Source: Ambulatory Visit | Attending: Internal Medicine | Admitting: Internal Medicine

## 2016-06-15 DIAGNOSIS — Z955 Presence of coronary angioplasty implant and graft: Secondary | ICD-10-CM

## 2016-06-17 ENCOUNTER — Encounter (HOSPITAL_COMMUNITY)
Admission: RE | Admit: 2016-06-17 | Discharge: 2016-06-17 | Disposition: A | Discharge status: Home / Self Care | Payer: Medicare Other | Source: Ambulatory Visit | Attending: Internal Medicine | Admitting: Internal Medicine

## 2016-06-17 DIAGNOSIS — Z955 Presence of coronary angioplasty implant and graft: Secondary | ICD-10-CM
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
## 2016-06-19 ENCOUNTER — Encounter (HOSPITAL_COMMUNITY)
Admission: RE | Admit: 2016-06-19 | Discharge: 2016-06-19 | Disposition: A | Discharge status: Home / Self Care | Payer: Medicare Other | Source: Ambulatory Visit | Attending: Internal Medicine | Admitting: Internal Medicine

## 2016-06-19 DIAGNOSIS — Z955 Presence of coronary angioplasty implant and graft: Secondary | ICD-10-CM

## 2016-06-22 ENCOUNTER — Encounter (HOSPITAL_COMMUNITY)
Admission: RE | Admit: 2016-06-22 | Discharge: 2016-06-22 | Disposition: A | Discharge status: Home / Self Care | Payer: Medicare Other | Source: Ambulatory Visit | Attending: Internal Medicine | Admitting: Internal Medicine

## 2016-06-22 DIAGNOSIS — Z955 Presence of coronary angioplasty implant and graft: Secondary | ICD-10-CM

## 2016-06-24 ENCOUNTER — Encounter (HOSPITAL_COMMUNITY)
Admission: RE | Admit: 2016-06-24 | Discharge: 2016-06-24 | Disposition: A | Discharge status: Home / Self Care | Payer: Medicare Other | Source: Ambulatory Visit | Attending: Internal Medicine | Admitting: Internal Medicine

## 2016-06-24 DIAGNOSIS — Z955 Presence of coronary angioplasty implant and graft: Secondary | ICD-10-CM

## 2016-06-26 ENCOUNTER — Encounter (HOSPITAL_COMMUNITY)
Admission: RE | Admit: 2016-06-26 | Discharge: 2016-06-26 | Disposition: A | Discharge status: Home / Self Care | Payer: Medicare Other | Source: Ambulatory Visit | Attending: Internal Medicine | Admitting: Internal Medicine

## 2016-06-26 ENCOUNTER — Encounter (HOSPITAL_COMMUNITY): Payer: Medicare Other

## 2016-06-26 DIAGNOSIS — Z955 Presence of coronary angioplasty implant and graft: Secondary | ICD-10-CM | POA: Diagnosis not present

## 2016-06-29 ENCOUNTER — Encounter (HOSPITAL_COMMUNITY)
Admission: RE | Admit: 2016-06-29 | Discharge: 2016-06-29 | Disposition: A | Discharge status: Home / Self Care | Payer: Medicare Other | Source: Ambulatory Visit | Attending: Internal Medicine | Admitting: Internal Medicine

## 2016-06-29 DIAGNOSIS — Z955 Presence of coronary angioplasty implant and graft: Secondary | ICD-10-CM | POA: Diagnosis not present

## 2016-06-29 NOTE — Progress Notes (Signed)
Pt post exercise BP:  98/60.  C/o mild dizziness upon arising after cool down.  Pt given water and allowed to sit for a few moments.  Recheck BP:   101/62, HR-92, sitting, 104/72, HR-102 standing.  Pt asymptomatic.  Pt reminded to consume adequate PO water intake daily. Pt verbalized understanding.

## 2016-07-01 ENCOUNTER — Encounter (HOSPITAL_COMMUNITY): Payer: Medicare Other

## 2016-07-06 ENCOUNTER — Encounter (HOSPITAL_COMMUNITY): Payer: Medicare Other

## 2016-07-08 ENCOUNTER — Encounter (HOSPITAL_COMMUNITY)
Admission: RE | Admit: 2016-07-08 | Discharge: 2016-07-08 | Disposition: A | Discharge status: Home / Self Care | Payer: Medicare Other | Source: Ambulatory Visit | Attending: Internal Medicine | Admitting: Internal Medicine

## 2016-07-08 DIAGNOSIS — Z955 Presence of coronary angioplasty implant and graft: Secondary | ICD-10-CM

## 2016-07-10 ENCOUNTER — Encounter (HOSPITAL_COMMUNITY): Payer: Medicare Other

## 2016-07-13 ENCOUNTER — Telehealth: Payer: Self-pay | Admitting: Internal Medicine

## 2016-07-13 ENCOUNTER — Encounter (HOSPITAL_COMMUNITY)
Admission: RE | Admit: 2016-07-13 | Discharge: 2016-07-13 | Disposition: A | Discharge status: Home / Self Care | Payer: Medicare Other | Source: Ambulatory Visit | Attending: Internal Medicine | Admitting: Internal Medicine

## 2016-07-13 DIAGNOSIS — Z955 Presence of coronary angioplasty implant and graft: Secondary | ICD-10-CM | POA: Insufficient documentation

## 2016-07-13 DIAGNOSIS — I469 Cardiac arrest, cause unspecified: Secondary | ICD-10-CM | POA: Diagnosis present

## 2016-07-13 DIAGNOSIS — I5023 Acute on chronic systolic (congestive) heart failure: Secondary | ICD-10-CM | POA: Diagnosis present

## 2016-07-13 DIAGNOSIS — Z79899 Other long term (current) drug therapy: Secondary | ICD-10-CM | POA: Insufficient documentation

## 2016-07-13 NOTE — Progress Notes (Signed)
Pt had episode of SVT during cardiac rehab exercise today.  Asymptomatic.  Max HR-140.  Phone call to office to notify. Rhythm strip faxed to Dr. Haroldine Laws and Dr. Lovena Le.  Will continue to monitor.

## 2016-07-13 NOTE — Telephone Encounter (Signed)
Called, left voice message for Lisa [name] at Cardiac Rehab to call us back.

## 2016-07-13 NOTE — Telephone Encounter (Signed)
New message      FYI Pt had SVT's while exercising this am.  Faxing strips to device clinic

## 2016-07-15 ENCOUNTER — Encounter (HOSPITAL_COMMUNITY)
Admission: RE | Admit: 2016-07-15 | Discharge: 2016-07-15 | Disposition: A | Discharge status: Home / Self Care | Payer: Medicare Other | Source: Ambulatory Visit | Attending: Internal Medicine | Admitting: Internal Medicine

## 2016-07-15 DIAGNOSIS — Z955 Presence of coronary angioplasty implant and graft: Secondary | ICD-10-CM | POA: Diagnosis not present
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
## 2016-07-16 NOTE — Telephone Encounter (Signed)
Sent fax to medical records to be faxed to [facility] Cardiac [facility]. Dr. Lovena Le reviewed telemetry readings from 07/13/16 faxed to [facility]. Dr. Lovena Le stated - "probable a-flutter non-sustained - watchful waiting. Continue rehab".

## 2016-07-17 ENCOUNTER — Encounter (HOSPITAL_COMMUNITY)
Admission: RE | Admit: 2016-07-17 | Discharge: 2016-07-17 | Disposition: A | Discharge status: Home / Self Care | Payer: Medicare Other | Source: Ambulatory Visit | Attending: Internal Medicine | Admitting: Internal Medicine

## 2016-07-17 DIAGNOSIS — Z955 Presence of coronary angioplasty implant and graft: Secondary | ICD-10-CM

## 2016-07-20 ENCOUNTER — Encounter (HOSPITAL_COMMUNITY)
Admission: RE | Admit: 2016-07-20 | Discharge: 2016-07-20 | Disposition: A | Discharge status: Home / Self Care | Payer: Medicare Other | Source: Ambulatory Visit | Attending: Internal Medicine | Admitting: Internal Medicine

## 2016-07-20 DIAGNOSIS — Z955 Presence of coronary angioplasty implant and graft: Secondary | ICD-10-CM

## 2016-07-22 ENCOUNTER — Encounter (HOSPITAL_COMMUNITY)
Admission: RE | Admit: 2016-07-22 | Discharge: 2016-07-22 | Disposition: A | Discharge status: Home / Self Care | Payer: Medicare Other | Source: Ambulatory Visit | Attending: Internal Medicine | Admitting: Internal Medicine

## 2016-07-22 DIAGNOSIS — Z955 Presence of coronary angioplasty implant and graft: Secondary | ICD-10-CM

## 2016-07-24 ENCOUNTER — Encounter (HOSPITAL_COMMUNITY)
Admission: RE | Admit: 2016-07-24 | Discharge: 2016-07-24 | Disposition: A | Discharge status: Home / Self Care | Payer: Medicare Other | Source: Ambulatory Visit | Attending: Internal Medicine | Admitting: Internal Medicine

## 2016-07-24 DIAGNOSIS — Z955 Presence of coronary angioplasty implant and graft: Secondary | ICD-10-CM | POA: Diagnosis not present

## 2016-07-27 ENCOUNTER — Encounter (HOSPITAL_COMMUNITY)
Admission: RE | Admit: 2016-07-27 | Discharge: 2016-07-27 | Disposition: A | Discharge status: Home / Self Care | Payer: Medicare Other | Source: Ambulatory Visit | Attending: Internal Medicine | Admitting: Internal Medicine

## 2016-07-27 DIAGNOSIS — Z955 Presence of coronary angioplasty implant and graft: Secondary | ICD-10-CM | POA: Diagnosis not present

## 2016-07-29 ENCOUNTER — Encounter (HOSPITAL_COMMUNITY)
Admission: RE | Admit: 2016-07-29 | Discharge: 2016-07-29 | Disposition: A | Discharge status: Home / Self Care | Payer: Medicare Other | Source: Ambulatory Visit | Attending: Internal Medicine | Admitting: Internal Medicine

## 2016-07-29 DIAGNOSIS — Z955 Presence of coronary angioplasty implant and graft: Secondary | ICD-10-CM

## 2016-07-31 ENCOUNTER — Encounter (HOSPITAL_COMMUNITY): Payer: Medicare Other

## 2016-08-04 ENCOUNTER — Ambulatory Visit (INDEPENDENT_AMBULATORY_CARE_PROVIDER_SITE_OTHER): Payer: Medicare Other | Admitting: *Deleted

## 2016-08-04 ENCOUNTER — Telehealth: Payer: Self-pay | Admitting: Cardiology

## 2016-08-04 DIAGNOSIS — I255 Ischemic cardiomyopathy: Secondary | ICD-10-CM | POA: Diagnosis not present

## 2016-08-04 NOTE — Telephone Encounter (Signed)
Spoke with pt and reminded pt of remote transmission that is due today. Pt verbalized understanding.   

## 2016-08-05 ENCOUNTER — Encounter (HOSPITAL_COMMUNITY)
Admission: RE | Admit: 2016-08-05 | Discharge: 2016-08-05 | Disposition: A | Discharge status: Home / Self Care | Payer: Medicare Other | Source: Ambulatory Visit | Attending: Internal Medicine | Admitting: Internal Medicine

## 2016-08-05 DIAGNOSIS — Z955 Presence of coronary angioplasty implant and graft: Secondary | ICD-10-CM | POA: Diagnosis not present

## 2016-08-05 NOTE — Progress Notes (Signed)
Remote ICD transmission.   

## 2016-08-06 LAB — CUP PACEART REMOTE DEVICE CHECK
Battery Remaining Longevity: 112 mo
Battery Voltage: 3.02 V
Brady Statistic AP VP Percent: 0.27 %
Brady Statistic AP VS Percent: 0.05 %
Brady Statistic AS VP Percent: 98.22 %
Brady Statistic AS VS Percent: 1.46 %
Brady Statistic RA Percent Paced: 0.32 %
Brady Statistic RV Percent Paced: 3.42 %
Date Time Interrogation Session: 20171227033830
HighPow Impedance: 79 Ohm
Implantable Lead Implant Date: 20170612
Implantable Lead Implant Date: 20170612
Implantable Lead Implant Date: 20170612
Implantable Lead Location: 753858
Implantable Lead Location: 753859
Implantable Lead Location: 753860
Implantable Lead Model: 4398
Implantable Lead Model: 5076
Implantable Pulse Generator Implant Date: 20170612
Lead Channel Impedance Value: 1007 Ohm
Lead Channel Impedance Value: 152 Ohm
Lead Channel Impedance Value: 208.627
Lead Channel Impedance Value: 208.627
Lead Channel Impedance Value: 218.667
Lead Channel Impedance Value: 218.667
Lead Channel Impedance Value: 285 Ohm
Lead Channel Impedance Value: 304 Ohm
Lead Channel Impedance Value: 304 Ohm
Lead Channel Impedance Value: 361 Ohm
Lead Channel Impedance Value: 513 Ohm
Lead Channel Impedance Value: 513 Ohm
Lead Channel Impedance Value: 665 Ohm
Lead Channel Impedance Value: 779 Ohm
Lead Channel Impedance Value: 817 Ohm
Lead Channel Impedance Value: 874 Ohm
Lead Channel Impedance Value: 893 Ohm
Lead Channel Impedance Value: 893 Ohm
Lead Channel Pacing Threshold Amplitude: 0.75 V
Lead Channel Pacing Threshold Amplitude: 0.875 V
Lead Channel Pacing Threshold Amplitude: 1.875 V
Lead Channel Pacing Threshold Pulse Width: 0.4 ms
Lead Channel Pacing Threshold Pulse Width: 0.4 ms
Lead Channel Pacing Threshold Pulse Width: 0.4 ms
Lead Channel Sensing Intrinsic Amplitude: 2.25 mV
Lead Channel Sensing Intrinsic Amplitude: 2.25 mV
Lead Channel Sensing Intrinsic Amplitude: 7.75 mV
Lead Channel Sensing Intrinsic Amplitude: 7.75 mV
Lead Channel Setting Pacing Amplitude: 1.75 V
Lead Channel Setting Pacing Amplitude: 2.5 V
Lead Channel Setting Pacing Amplitude: 2.5 V
Lead Channel Setting Pacing Pulse Width: 0.4 ms
Lead Channel Setting Pacing Pulse Width: 0.4 ms
Lead Channel Setting Sensing Sensitivity: 0.3 mV

## 2016-08-07 ENCOUNTER — Encounter: Payer: Self-pay | Admitting: Cardiology

## 2016-08-07 ENCOUNTER — Encounter (HOSPITAL_COMMUNITY)
Admission: RE | Admit: 2016-08-07 | Discharge: 2016-08-07 | Disposition: A | Discharge status: Home / Self Care | Payer: Medicare Other | Source: Ambulatory Visit | Attending: Internal Medicine | Admitting: Internal Medicine

## 2016-08-07 DIAGNOSIS — Z955 Presence of coronary angioplasty implant and graft: Secondary | ICD-10-CM | POA: Diagnosis not present

## 2016-08-12 ENCOUNTER — Encounter (HOSPITAL_COMMUNITY)
Admission: RE | Admit: 2016-08-12 | Discharge: 2016-08-12 | Disposition: A | Discharge status: Home / Self Care | Payer: Medicare Other | Source: Ambulatory Visit | Attending: Internal Medicine | Admitting: Internal Medicine

## 2016-08-12 ENCOUNTER — Telehealth (HOSPITAL_COMMUNITY): Payer: Self-pay | Admitting: Family Medicine

## 2016-08-12 DIAGNOSIS — Z955 Presence of coronary angioplasty implant and graft: Secondary | ICD-10-CM | POA: Insufficient documentation

## 2016-08-12 DIAGNOSIS — Z79899 Other long term (current) drug therapy: Secondary | ICD-10-CM | POA: Insufficient documentation

## 2016-08-12 DIAGNOSIS — I5023 Acute on chronic systolic (congestive) heart failure: Secondary | ICD-10-CM | POA: Insufficient documentation

## 2016-08-12 NOTE — Progress Notes (Signed)
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 continued exercise and life style modification education including  stress management and relaxation techniques to decrease cardiac risk profile.

## 2016-08-14 ENCOUNTER — Encounter (HOSPITAL_COMMUNITY)
Admission: RE | Admit: 2016-08-14 | Discharge: 2016-08-14 | Disposition: A | Discharge status: Home / Self Care | Payer: Medicare Other | Source: Ambulatory Visit | Attending: Internal Medicine | Admitting: Internal Medicine

## 2016-08-14 DIAGNOSIS — I469 Cardiac arrest, cause unspecified: Secondary | ICD-10-CM | POA: Diagnosis present

## 2016-08-14 DIAGNOSIS — Z955 Presence of coronary angioplasty implant and graft: Secondary | ICD-10-CM

## 2016-08-14 DIAGNOSIS — I5023 Acute on chronic systolic (congestive) heart failure: Secondary | ICD-10-CM | POA: Diagnosis present

## 2016-08-14 DIAGNOSIS — Z79899 Other long term (current) drug therapy: Secondary | ICD-10-CM | POA: Diagnosis not present

## 2016-08-17 ENCOUNTER — Encounter (HOSPITAL_COMMUNITY)
Admission: RE | Admit: 2016-08-17 | Discharge: 2016-08-17 | Disposition: A | Discharge status: Home / Self Care | Payer: Medicare Other | Source: Ambulatory Visit | Attending: Internal Medicine | Admitting: Internal Medicine

## 2016-08-17 DIAGNOSIS — Z955 Presence of coronary angioplasty implant and graft: Secondary | ICD-10-CM

## 2016-08-19 ENCOUNTER — Encounter (HOSPITAL_COMMUNITY)
Admission: RE | Admit: 2016-08-19 | Discharge: 2016-08-19 | Disposition: A | Discharge status: Home / Self Care | Payer: Medicare Other | Source: Ambulatory Visit | Attending: Internal Medicine | Admitting: Internal Medicine

## 2016-08-19 DIAGNOSIS — Z955 Presence of coronary angioplasty implant and graft: Secondary | ICD-10-CM | POA: Diagnosis not present

## 2016-08-21 ENCOUNTER — Encounter (HOSPITAL_COMMUNITY)
Admission: RE | Admit: 2016-08-21 | Discharge: 2016-08-21 | Disposition: A | Discharge status: Home / Self Care | Payer: Medicare Other | Source: Ambulatory Visit | Attending: Internal Medicine | Admitting: Internal Medicine

## 2016-08-21 ENCOUNTER — Encounter: Payer: Self-pay | Admitting: Cardiology

## 2016-08-21 DIAGNOSIS — Z955 Presence of coronary angioplasty implant and graft: Secondary | ICD-10-CM

## 2016-08-24 ENCOUNTER — Encounter (HOSPITAL_COMMUNITY): Payer: Self-pay

## 2016-08-24 ENCOUNTER — Encounter (HOSPITAL_COMMUNITY)
Admission: RE | Admit: 2016-08-24 | Discharge: 2016-08-24 | Disposition: A | Discharge status: Home / Self Care | Payer: Medicare Other | Source: Ambulatory Visit | Attending: Internal Medicine | Admitting: Internal Medicine

## 2016-08-24 VITALS — Ht 62.0 in | Wt 242.3 lb

## 2016-08-24 DIAGNOSIS — Z955 Presence of coronary angioplasty implant and graft: Secondary | ICD-10-CM

## 2016-08-24 NOTE — Progress Notes (Signed)
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 Pt states " improve functional mobility " with balance and movement around the house. Pt met personal goals of increasing energy levels and activity with less SOB. Pt also improve flexibility and ROM gloabally.   Expected Outcomes Pt will continue to improve in cardiovascular fitness and flexibility/ROM Pt will continue with HEP and return to zumba classes. Pt will also improve in SOB symptoms and endurance. Pt will continue to show progression in exercise capacity  Pt will continue to improve in exercise tolerance and have decrease symptoms of SOB with physical activity. Pt will continue to increase cardiorespiratory fitness.   [place] Name 09/15/16 1408             Core Components/Risk Factors/Patient Goals Review   Personal Goals Review Weight Management/Obesity       Review Pt gained 4.4 lb while in rehab       Expected Outcomes Pt will continue Heart Healthy diet and lifestyle changes to promote desired wt loss.          Core Components/Risk Factors/Patient Goals at Discharge (Final Review):      Goals and Risk Factor Review - 09/15/16 1408      Core Components/Risk Factors/Patient Goals Review   Personal Goals Review Weight Management/Obesity   Review Pt gained 4.4 lb while in rehab   Expected Outcomes Pt will continue Heart Healthy diet and lifestyle changes to promote desired wt loss.      ITP Comments:     ITP Comments    Row Name 04/28/16 1326 05/15/16 1349 08/07/16 0817       ITP Comments Medical Director- Dr. Fransico Him, MD Patient attended the "Taking the high out of high blood pressure" video/lecture education class on 05/15/16. Met outcomes/goals. Attended CPR education class, Met outcomes/goals         Comments: Pt graduated from cardiac rehab program today with completion of 36 exercise sessions in Phase II. Pt maintained good attendance and progressed nicely during his participation in rehab as evidenced by increased MET level.   Medication  list reconciled. Repeat  PHQ score- 0 .  Pt has made significant lifestyle changes and should be commended for her  success. Pt feels she has achieved some of her  goals during cardiac rehab, which include increased strength and stamina, however pt would like to continue working towards weight loss, flexibility and decreased dyspnea goals  Pt congratulated on her success primarily her increased strength and body toning that occurred during the program.  Pt was able to push a cart loaded with 48lbs hand weights for 10 minutes without difficulty.   Pt plans to continue exercising on her own.

## 2016-08-26 ENCOUNTER — Encounter (HOSPITAL_COMMUNITY): Payer: Medicare Other

## 2016-08-28 ENCOUNTER — Encounter (HOSPITAL_COMMUNITY): Admission: RE | Admit: 2016-08-28 | Payer: Medicare Other | Source: Ambulatory Visit

## 2016-08-31 ENCOUNTER — Encounter (HOSPITAL_COMMUNITY): Payer: Medicare Other

## 2016-09-15 NOTE — Addendum Note (Signed)
Encounter addended by: Jewel Baize, RD on: 09/15/2016  2:12 PM<BR>    Actions taken: Flowsheet data copied forward, Visit Navigator Flowsheet section accepted

## 2016-11-02 ENCOUNTER — Encounter: Payer: Self-pay | Admitting: Emergency Medicine

## 2016-11-02 ENCOUNTER — Ambulatory Visit (INDEPENDENT_AMBULATORY_CARE_PROVIDER_SITE_OTHER): Payer: Medicare Other

## 2016-11-02 ENCOUNTER — Ambulatory Visit (INDEPENDENT_AMBULATORY_CARE_PROVIDER_SITE_OTHER): Payer: Medicare Other | Admitting: Emergency Medicine

## 2016-11-02 VITALS — BP 130/80 | HR 92 | Temp 98.6°F | Resp 20 | Ht 61.0 in | Wt 251.6 lb

## 2016-11-02 DIAGNOSIS — J209 Acute bronchitis, unspecified: Secondary | ICD-10-CM | POA: Diagnosis not present

## 2016-11-02 DIAGNOSIS — R059 Cough, unspecified: Secondary | ICD-10-CM

## 2016-11-02 DIAGNOSIS — R05 Cough: Secondary | ICD-10-CM | POA: Diagnosis not present

## 2016-11-02 MED ORDER — PROMETHAZINE-CODEINE 6.25-10 MG/5ML PO SYRP: 5.0000 mL | ORAL_SOLUTION | Freq: Every evening | ORAL | 0 refills | Status: DC | PRN

## 2016-11-02 MED ORDER — AZITHROMYCIN 250 MG PO TABS: ORAL_TABLET | ORAL | 0 refills | Status: DC

## 2016-11-02 NOTE — Progress Notes (Signed)
Lisa [name] 76 y.o.   Chief Complaint  Patient presents with  . Cough    x 1 week, green, brown to plum color mucus    HISTORY OF PRESENT ILLNESS: This is a 76 y.o. female complaining of productive cough x 2 weeks.  Cough  This is a new problem. The current episode started 1 to 4 weeks ago. The problem has been waxing and waning. The problem occurs every few minutes. The cough is productive of purulent sputum and productive of sputum. Associated symptoms include nasal congestion and rhinorrhea. Pertinent negatives include no chest pain, chills, ear congestion, ear pain, eye redness, fever, headaches, heartburn, hemoptysis, rash, sore throat, shortness of breath, weight loss or wheezing. Risk factors: none. She has tried OTC cough suppressant for the symptoms. The treatment provided mild relief. There is no history of asthma, bronchitis, COPD, emphysema or pneumonia.     Prior to Admission medications   Medication Sig Start Date End Date Taking? Authorizing Provider  acetaminophen (TYLENOL) 500 MG tablet Take 500-1,000 mg by mouth every 6 (six) hours as needed for moderate pain.   Yes Historical Provider, MD  aspirin EC 81 MG tablet Take 81 mg by mouth daily.   Yes Historical Provider, MD  atorvastatin (LIPITOR) 20 MG tablet Take 1 tablet (20 mg total) by mouth daily. 05/07/16  Yes Jolaine Artist, MD  carvedilol (COREG) 25 MG tablet Take 1 tablet (25 mg total) by mouth 2 (two) times daily with a meal. 01/28/16  Yes Jolaine Artist, MD  Cholecalciferol (VITAMIN D-3 PO) Take 5,000 Units by mouth daily.   Yes Historical Provider, MD  Coenzyme Q10 (COQ10 PO) Take 1 capsule by mouth daily.   Yes Historical Provider, MD  furosemide (LASIX) 40 MG tablet Take 1 tablet (40 mg total) by mouth every other day. 10/22/15  Yes Shaune Pascal Bensimhon, MD  lisinopril (PRINIVIL,ZESTRIL) 5 MG tablet Take 1 tablet (5 mg total) by mouth daily. 11/29/15  Yes Jolaine Artist, MD  Multiple Vitamin  (MULTIVITAMIN WITH MINERALS) TABS tablet Take 1 tablet by mouth daily.   Yes Historical Provider, MD  nitroGLYCERIN (NITROSTAT) 0.4 MG SL tablet Place 1 tablet (0.4 mg total) under the tongue every 5 (five) minutes x 3 doses as needed for chest pain. 10/09/15  Yes Arbutus Leas, NP  thyroid (ARMOUR) 15 MG tablet Take 15 mg by mouth daily.   Yes Historical Provider, MD  ticagrelor (BRILINTA) 90 MG TABS tablet Take 1 tablet (90 mg total) by mouth 2 (two) times daily. 11/29/15  Yes Jolaine Artist, MD    Allergies  Allergen Reactions  . Flagyl [Metronidazole] Itching and Rash    Welts, itching  . Penicillins Hives, Itching and Rash    Has patient had a PCN reaction causing immediate rash, facial/tongue/throat swelling, SOB or lightheadedness with hypotension: Yes  Has patient had a PCN reaction causing severe rash involving mucus membranes or skin necrosis: Yes Has patient had a PCN reaction that required hospitalization No Has patient had a PCN reaction occurring within the last 10 years: NO If all of the above answers are "NO", then may proceed with Cephalosporin use.   . Sulfamethoxazole Rash  . Meperidine Nausea And Vomiting  . Morphine And Related Nausea And Vomiting  . Wheat Bran Other (See Comments)    Runny nose  . Doxycycline Rash    Made her "feel terrible" & gave her a yeast infection  . Lanolin Itching    Patient  Active Problem List   Diagnosis Date Noted  . Unstable angina ([place]) 10/07/2015  . Chronic systolic heart failure ([place])   . PVC's (premature ventricular contractions)   . Ventricular fibrillation ([place]) 10/03/2015  . Cardiac arrest ([place]) 10/03/2015  . Essential hypertension 09/09/2015  . Lymphedema 02/21/2015  . Other specified hypothyroidism 02/21/2015    Past Medical History:  Diagnosis Date  . Cardiac arrest ([place]) 09/2015  . CHF (congestive heart failure) ([place])   . Complication of anesthesia   . Hx: UTI (urinary tract infection)   . Hypertension   .  Melanoma ([place])   . PONV (postoperative nausea and vomiting)   . PVC (premature ventricular contraction)    HISTORY OF  . Thyroid disease     Past Surgical History:  Procedure Laterality Date  . ABDOMINAL HYSTERECTOMY    . APPENDECTOMY    . CARDIAC CATHETERIZATION N/A 10/03/2015   Procedure: Left Heart Cath and Coronary Angiography;  Surgeon: [name], MD;  Location: [facility];  Service: Cardiovascular;  Laterality: N/A;  . CARDIAC CATHETERIZATION N/A 10/07/2015   Procedure: Coronary Stent Intervention;  Surgeon: [name], MD;  Location: [facility];  Service: Cardiovascular;  Laterality: N/A;  . EP IMPLANTABLE DEVICE N/A 01/20/2016   Procedure: BiV ICD Insertion CRT-D;  Surgeon: [name], MD;  Location: [facility];  Service: Cardiovascular;  Laterality: N/A;  . TONSILLECTOMY    . TONSILLECTOMY    . TUBAL LIGATION      Social History   Social History  . Marital status: Divorced    Spouse name: N/A  . Number of children: N/A  . Years of education: N/A   Occupational History  . Not on file.   Social History Main Topics  . Smoking status: Never Smoker  . Smokeless tobacco: Never Used  . Alcohol use No  . Drug use: No  . Sexual activity: Not on file   Other Topics Concern  . Not on file   Social History Narrative  . No narrative on file    Family History  Problem Relation Age of Onset  . Coronary artery disease Mother   . Thyroid disease Mother   . Multiple myeloma Father   . Heart attack Maternal Grandfather   . Heart attack Paternal Grandfather      Review of Systems  Constitutional: Negative for chills, fever and weight loss.  HENT: Positive for congestion and rhinorrhea. Negative for ear pain, nosebleeds, sinus pain and sore throat.   Eyes: Negative for blurred vision, double vision, discharge and redness.  Respiratory: Positive for cough. Negative for hemoptysis, shortness of breath and wheezing.     Cardiovascular: Positive for leg swelling (h/o lymphedema). Negative for chest pain and palpitations.  Gastrointestinal: Negative for abdominal pain, diarrhea, heartburn, nausea and vomiting.  Genitourinary: Negative for dysuria, frequency and hematuria.  Skin: Negative for rash.  Neurological: Negative for dizziness, sensory change, focal weakness and headaches.  Endo/Heme/Allergies: Negative.   All other systems reviewed and are negative.  Vitals:   11/02/16 1340 11/02/16 1353  BP: (!) 163/75 130/80  Pulse: 92   Resp: 20   Temp: 98.6 F (37 C)      Physical Exam  Constitutional: She is oriented to person, place, and time. She appears well-developed and well-nourished.  HENT:  Head: Normocephalic and atraumatic.  Nose: Nose normal.  Mouth/Throat: Oropharynx is clear and moist. No oropharyngeal exudate.  Eyes: Conjunctivae and EOM are normal. Pupils  are equal, round, and reactive to light.  Neck: Normal range of motion. Neck supple. No JVD present. No thyromegaly present.  Cardiovascular: Normal rate, regular rhythm and normal heart sounds.   LE: +chronic edema (lymphedema) +internal defibrillator left upper chest  Pulmonary/Chest: Effort normal and breath sounds normal.  Abdominal: Soft. Bowel sounds are normal.  Lymphadenopathy:    She has no cervical adenopathy.  Neurological: She is alert and oriented to person, place, and time. No sensory deficit. She exhibits normal muscle tone.  Skin: Skin is warm and dry. Capillary refill takes less than 2 seconds.  Psychiatric: She has a normal mood and affect. Her behavior is normal.  Vitals reviewed.    ASSESSMENT & PLAN: Terris was seen today for cough.  Diagnoses and all orders for this visit:  Acute bronchitis, unspecified organism  Cough -     DG Chest 2 View; Future  Other orders -     azithromycin (ZITHROMAX) 250 MG tablet; Sig as indicated -     promethazine-codeine (PHENERGAN WITH CODEINE) 6.25-10 MG/5ML  syrup; Take 5 mLs by mouth at bedtime as needed for cough.    Patient Instructions       IF you received an x-ray today, you will receive an invoice from [facility]. Please contact [facility] at [phone] with questions or concerns regarding your invoice.   IF you received labwork today, you will receive an invoice from [facility]. Please contact [facility] at [phone] with questions or concerns regarding your invoice.   Our billing staff will not be able to assist you with questions regarding bills from these companies.  You will be contacted with the lab results as soon as they are available. The fastest way to get your results is to activate your My Chart account. Instructions are located on the last page of this paperwork. If you have not heard from [place] regarding the results in 2 weeks, please contact this office.     Acute Bronchitis, Adult Acute bronchitis is when air tubes (bronchi) in the lungs suddenly get swollen. The condition can make it hard to breathe. It can also cause these symptoms:  A cough.  Coughing up clear, yellow, or green mucus.  Wheezing.  Chest congestion.  Shortness of breath.  A fever.  Body aches.  Chills.  A sore throat. Follow these instructions at home: Medicines   Take over-the-counter and prescription medicines only as told by your doctor.  If you were prescribed an antibiotic medicine, take it as told by your doctor. Do not stop taking the antibiotic even if you start to feel better. General instructions   Rest.  Drink enough fluids to keep your pee (urine) clear or pale yellow.  Avoid smoking and secondhand smoke. If you smoke and you need help quitting, ask your doctor. Quitting will help your lungs heal faster.  Use an inhaler, cool mist vaporizer, or humidifier as told by your doctor.  Keep all follow-up visits as told by your doctor. This is important. How is this prevented? To lower your risk of  getting this condition again:  Wash your hands often with soap and water. If you cannot use soap and water, use hand sanitizer.  Avoid contact with people who have cold symptoms.  Try not to touch your hands to your mouth, nose, or eyes.  Make sure to get the flu shot every year. Contact a doctor if:  Your symptoms do not get better in 2 weeks. Get help right away if:  You cough up blood.  You have chest pain.  You have very bad shortness of breath.  You become dehydrated.  You faint (pass out) or keep feeling like you are going to pass out.  You keep throwing up (vomiting).  You have a very bad headache.  Your fever or chills gets worse. This information is not intended to replace advice given to you by your health care provider. Make sure you discuss any questions you have with your health care provider. Document Released: 01/13/2008 Document Revised: 03/04/2016 Document Reviewed: 01/15/2016 Elsevier Interactive Patient Education  2017 Elsevier Inc.      Agustina Caroli, MD [facility]

## 2016-11-02 NOTE — Patient Instructions (Addendum)
     IF you received an x-ray today, you will receive an invoice from [facility]. Please contact [facility] at [phone] with questions or concerns regarding your invoice.   IF you received labwork today, you will receive an invoice from [facility]. Please contact [facility] at [phone] with questions or concerns regarding your invoice.   Our billing staff will not be able to assist you with questions regarding bills from these companies.  You will be contacted with the lab results as soon as they are available. The fastest way to get your results is to activate your My Chart account. Instructions are located on the last page of this paperwork. If you have not heard from us regarding the results in 2 weeks, please contact this office.      Acute Bronchitis, Adult Acute bronchitis is when air tubes (bronchi) in the lungs suddenly get swollen. The condition can make it hard to breathe. It can also cause these symptoms:  A cough.  Coughing up clear, yellow, or green mucus.  Wheezing.  Chest congestion.  Shortness of breath.  A fever.  Body aches.  Chills.  A sore throat.  Follow these instructions at home: Medicines  Take over-the-counter and prescription medicines only as told by your doctor.  If you were prescribed an antibiotic medicine, take it as told by your doctor. Do not stop taking the antibiotic even if you start to feel better. General instructions  Rest.  Drink enough fluids to keep your pee (urine) clear or pale yellow.  Avoid smoking and secondhand smoke. If you smoke and you need help quitting, ask your doctor. Quitting will help your lungs heal faster.  Use an inhaler, cool mist vaporizer, or humidifier as told by your doctor.  Keep all follow-up visits as told by your doctor. This is important. How is this prevented? To lower your risk of getting this condition again:  Wash your hands often with soap and water. If you cannot  use soap and water, use hand sanitizer.  Avoid contact with people who have cold symptoms.  Try not to touch your hands to your mouth, nose, or eyes.  Make sure to get the flu shot every year.  Contact a doctor if:  Your symptoms do not get better in 2 weeks. Get help right away if:  You cough up blood.  You have chest pain.  You have very bad shortness of breath.  You become dehydrated.  You faint (pass out) or keep feeling like you are going to pass out.  You keep throwing up (vomiting).  You have a very bad headache.  Your fever or chills gets worse. This information is not intended to replace advice given to you by your health care provider. Make sure you discuss any questions you have with your health care provider. Document Released: 01/13/2008 Document Revised: 03/04/2016 Document Reviewed: 01/15/2016 Elsevier Interactive Patient Education  2017 Elsevier Inc.  

## 2016-11-04 ENCOUNTER — Ambulatory Visit (INDEPENDENT_AMBULATORY_CARE_PROVIDER_SITE_OTHER): Payer: Medicare Other | Admitting: *Deleted

## 2016-11-04 DIAGNOSIS — I255 Ischemic cardiomyopathy: Secondary | ICD-10-CM

## 2016-11-04 NOTE — Progress Notes (Signed)
Remote ICD transmission.   

## 2016-11-05 LAB — CUP PACEART REMOTE DEVICE CHECK
Battery Remaining Longevity: 99 mo
Battery Voltage: 3 V
Brady Statistic AP VP Percent: 0.31 %
Brady Statistic AP VS Percent: 0.04 %
Brady Statistic AS VP Percent: 98.2 %
Brady Statistic AS VS Percent: 1.44 %
Brady Statistic RA Percent Paced: 0.36 %
Brady Statistic RV Percent Paced: 3.74 %
Date Time Interrogation Session: 20180328062724
HighPow Impedance: 82 Ohm
Implantable Lead Implant Date: 20170612
Implantable Lead Implant Date: 20170612
Implantable Lead Implant Date: 20170612
Implantable Lead Location: 753858
Implantable Lead Location: 753859
Implantable Lead Location: 753860
Implantable Lead Model: 4398
Implantable Lead Model: 5076
Implantable Pulse Generator Implant Date: 20170612
Lead Channel Impedance Value: 1007 Ohm
Lead Channel Impedance Value: 1007 Ohm
Lead Channel Impedance Value: 1140 Ohm
Lead Channel Impedance Value: 172.541
Lead Channel Impedance Value: 213.926
Lead Channel Impedance Value: 225.548
Lead Channel Impedance Value: 256.983
Lead Channel Impedance Value: 273.94 Ohm
Lead Channel Impedance Value: 285 Ohm
Lead Channel Impedance Value: 304 Ohm
Lead Channel Impedance Value: 361 Ohm
Lead Channel Impedance Value: 399 Ohm
Lead Channel Impedance Value: 513 Ohm
Lead Channel Impedance Value: 589 Ohm
Lead Channel Impedance Value: 722 Ohm
Lead Channel Impedance Value: 836 Ohm
Lead Channel Impedance Value: 874 Ohm
Lead Channel Impedance Value: 950 Ohm
Lead Channel Pacing Threshold Amplitude: 0.875 V
Lead Channel Pacing Threshold Amplitude: 1 V
Lead Channel Pacing Threshold Amplitude: 1.875 V
Lead Channel Pacing Threshold Pulse Width: 0.4 ms
Lead Channel Pacing Threshold Pulse Width: 0.4 ms
Lead Channel Pacing Threshold Pulse Width: 0.4 ms
Lead Channel Sensing Intrinsic Amplitude: 3 mV
Lead Channel Sensing Intrinsic Amplitude: 3 mV
Lead Channel Sensing Intrinsic Amplitude: 9.625 mV
Lead Channel Sensing Intrinsic Amplitude: 9.625 mV
Lead Channel Setting Pacing Amplitude: 2 V
Lead Channel Setting Pacing Amplitude: 2.5 V
Lead Channel Setting Pacing Amplitude: 3 V
Lead Channel Setting Pacing Pulse Width: 0.4 ms
Lead Channel Setting Pacing Pulse Width: 0.4 ms
Lead Channel Setting Sensing Sensitivity: 0.3 mV

## 2016-11-06 ENCOUNTER — Encounter: Payer: Self-pay | Admitting: Cardiology

## 2016-12-01 ENCOUNTER — Other Ambulatory Visit (HOSPITAL_COMMUNITY): Payer: Self-pay | Admitting: Cardiology

## 2016-12-01 MED ORDER — LISINOPRIL 5 MG PO TABS: 5.0000 mg | ORAL_TABLET | Freq: Every day | ORAL | 3 refills | Status: DC

## 2016-12-23 ENCOUNTER — Encounter (HOSPITAL_COMMUNITY): Payer: Self-pay | Admitting: Internal Medicine

## 2016-12-23 ENCOUNTER — Ambulatory Visit (HOSPITAL_COMMUNITY)
Admission: RE | Admit: 2016-12-23 | Discharge: 2016-12-23 | Disposition: A | Discharge status: Home / Self Care | Payer: Medicare Other | Source: Ambulatory Visit | Attending: Internal Medicine | Admitting: Internal Medicine

## 2016-12-23 VITALS — BP 140/84 | HR 65 | Ht 61.0 in | Wt 252.0 lb

## 2016-12-23 DIAGNOSIS — I5022 Chronic systolic (congestive) heart failure: Secondary | ICD-10-CM | POA: Diagnosis not present

## 2016-12-23 DIAGNOSIS — Z8674 Personal history of sudden cardiac arrest: Secondary | ICD-10-CM | POA: Insufficient documentation

## 2016-12-23 DIAGNOSIS — I110 Hypertensive heart disease with heart failure: Secondary | ICD-10-CM | POA: Diagnosis not present

## 2016-12-23 DIAGNOSIS — Z9889 Other specified postprocedural states: Secondary | ICD-10-CM | POA: Diagnosis not present

## 2016-12-23 DIAGNOSIS — I10 Essential (primary) hypertension: Secondary | ICD-10-CM | POA: Diagnosis not present

## 2016-12-23 DIAGNOSIS — Z90710 Acquired absence of both cervix and uterus: Secondary | ICD-10-CM | POA: Diagnosis not present

## 2016-12-23 DIAGNOSIS — I4901 Ventricular fibrillation: Secondary | ICD-10-CM | POA: Diagnosis not present

## 2016-12-23 DIAGNOSIS — Z79899 Other long term (current) drug therapy: Secondary | ICD-10-CM | POA: Diagnosis not present

## 2016-12-23 DIAGNOSIS — Z880 Allergy status to penicillin: Secondary | ICD-10-CM | POA: Diagnosis not present

## 2016-12-23 DIAGNOSIS — I2510 Atherosclerotic heart disease of native coronary artery without angina pectoris: Secondary | ICD-10-CM | POA: Diagnosis not present

## 2016-12-23 DIAGNOSIS — I429 Cardiomyopathy, unspecified: Secondary | ICD-10-CM | POA: Diagnosis not present

## 2016-12-23 DIAGNOSIS — I5023 Acute on chronic systolic (congestive) heart failure: Secondary | ICD-10-CM | POA: Diagnosis not present

## 2016-12-23 DIAGNOSIS — Z7982 Long term (current) use of aspirin: Secondary | ICD-10-CM | POA: Diagnosis not present

## 2016-12-23 DIAGNOSIS — I890 Lymphedema, not elsewhere classified: Secondary | ICD-10-CM

## 2016-12-23 MED ORDER — LISINOPRIL 10 MG PO TABS
10.0000 mg | ORAL_TABLET | Freq: Every day | ORAL | 3 refills | Status: DC
Start: 2016-12-23 — End: 2017-02-25
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
## 2016-12-23 NOTE — Patient Instructions (Addendum)
Increase Furosemide (lasix) to 40 mg daily for a few days until your fluid comes down  Increase Lisinopril to 10 mg daily  Labs in 1-2 weeks at [facility]  We will contact you in 6 months to schedule your next appointment and echocardiogram

## 2017-01-06 ENCOUNTER — Other Ambulatory Visit: Payer: Medicare Other | Admitting: *Deleted

## 2017-01-06 DIAGNOSIS — I10 Essential (primary) hypertension: Secondary | ICD-10-CM

## 2017-01-06 LAB — BASIC METABOLIC PANEL
BUN/Creatinine Ratio: 22 (ref 12–28)
BUN: 24 mg/dL (ref 8–27)
CO2: 22 mmol/L (ref 18–29)
Calcium: 9.8 mg/dL (ref 8.7–10.3)
Chloride: 104 mmol/L (ref 96–106)
Creatinine, Ser: 1.07 mg/dL — ABNORMAL HIGH (ref 0.57–1.00)
GFR calc Af Amer: 59 mL/min/{1.73_m2} — ABNORMAL LOW (ref >59)
GFR calc non Af Amer: 51 mL/min/{1.73_m2} — ABNORMAL LOW (ref >59)
Glucose: 121 mg/dL — ABNORMAL HIGH (ref 65–99)
Potassium: 5.3 mmol/L — ABNORMAL HIGH (ref 3.5–5.2)
Sodium: 140 mmol/L (ref 134–144)

## 2017-01-06 NOTE — Addendum Note (Signed)
Addended by: Eulis Foster on: 01/06/2017 10:48 AM   Modules accepted: Orders

## 2017-01-18 ENCOUNTER — Ambulatory Visit (INDEPENDENT_AMBULATORY_CARE_PROVIDER_SITE_OTHER): Payer: Medicare Other | Admitting: Internal Medicine

## 2017-01-18 ENCOUNTER — Encounter: Payer: Self-pay | Admitting: Internal Medicine

## 2017-01-18 VITALS — BP 140/68 | HR 66 | Ht 62.0 in | Wt 254.0 lb

## 2017-01-18 DIAGNOSIS — I469 Cardiac arrest, cause unspecified: Secondary | ICD-10-CM | POA: Diagnosis not present

## 2017-01-18 DIAGNOSIS — I5022 Chronic systolic (congestive) heart failure: Secondary | ICD-10-CM

## 2017-01-18 DIAGNOSIS — I255 Ischemic cardiomyopathy: Secondary | ICD-10-CM

## 2017-01-18 DIAGNOSIS — Z95810 Presence of automatic (implantable) cardiac defibrillator: Secondary | ICD-10-CM | POA: Diagnosis not present
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
## 2017-01-18 NOTE — Patient Instructions (Signed)
Medication Instructions:  Your physician recommends that you continue on your current medications as directed. Please refer to the Current Medication list given to you today.   Labwork: None Ordered   Testing/Procedures: None Ordered   Follow-Up: Your physician wants you to follow-up in: 1 year with Dr. Lovena Le. You will receive a reminder letter in the mail two months in advance. If you don't receive a letter, please call our office to schedule the follow-up appointment.  Remote monitoring is used to monitor your ICD from home. This monitoring reduces the number of office visits required to check your device to one time per year. It allows [place] to keep an eye on the functioning of your device to ensure it is working properly. You are scheduled for a device check from home on  04/19/17. You may send your transmission at any time that day. If you have a wireless device, the transmission will be sent automatically. After your physician reviews your transmission, you will receive a postcard with your next transmission date.    Any Other Special Instructions Will Be Listed Below (If Applicable).     If you need a refill on your cardiac medications before your next appointment, please call your pharmacy.

## 2017-02-08 ENCOUNTER — Telehealth: Payer: Self-pay | Admitting: Internal Medicine

## 2017-02-08 NOTE — Telephone Encounter (Signed)
New message ° ° ° °Pt is calling asking for a call back. She did not say what it was about. °

## 2017-02-09 NOTE — Telephone Encounter (Signed)
New Message     Pt needs to speak with you , would not give reason

## 2017-02-09 NOTE — Telephone Encounter (Signed)
Called, pt. Pt informed she needs a letter from Dr. Lovena Le stating she is fit to drive. Pt also has a form from the [facility] that needs to go with the letter. Pt stated she will drop off form Thursday. Pt would like it to be faxed.

## 2017-02-11 ENCOUNTER — Telehealth (HOSPITAL_COMMUNITY): Payer: Self-pay | Admitting: *Deleted

## 2017-02-11 NOTE — Telephone Encounter (Signed)
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

## 2017-02-16 LAB — CUP PACEART INCLINIC DEVICE CHECK
Battery Remaining Longevity: 97 mo
Battery Voltage: 3 V
Brady Statistic AP VP Percent: 0.29 %
Brady Statistic AP VS Percent: 0.04 %
Brady Statistic AS VP Percent: 98.22 %
Brady Statistic AS VS Percent: 1.45 %
Brady Statistic RA Percent Paced: 0.33 %
Brady Statistic RV Percent Paced: 4.08 %
Date Time Interrogation Session: 20180611144040
HighPow Impedance: 77 Ohm
Implantable Lead Implant Date: 20170612
Implantable Lead Implant Date: 20170612
Implantable Lead Implant Date: 20170612
Implantable Lead Location: 753858
Implantable Lead Location: 753859
Implantable Lead Location: 753860
Implantable Lead Model: 4398
Implantable Lead Model: 5076
Implantable Pulse Generator Implant Date: 20170612
Lead Channel Impedance Value: 1083 Ohm
Lead Channel Impedance Value: 172.541
Lead Channel Impedance Value: 208.627
Lead Channel Impedance Value: 221.559
Lead Channel Impedance Value: 249.375
Lead Channel Impedance Value: 268.078
Lead Channel Impedance Value: 304 Ohm
Lead Channel Impedance Value: 304 Ohm
Lead Channel Impedance Value: 361 Ohm
Lead Channel Impedance Value: 399 Ohm
Lead Channel Impedance Value: 532 Ohm
Lead Channel Impedance Value: 551 Ohm
Lead Channel Impedance Value: 665 Ohm
Lead Channel Impedance Value: 817 Ohm
Lead Channel Impedance Value: 817 Ohm
Lead Channel Impedance Value: 893 Ohm
Lead Channel Impedance Value: 950 Ohm
Lead Channel Impedance Value: 950 Ohm
Lead Channel Pacing Threshold Amplitude: 0.75 V
Lead Channel Pacing Threshold Amplitude: 0.75 V
Lead Channel Pacing Threshold Amplitude: 1.75 V
Lead Channel Pacing Threshold Pulse Width: 0.4 ms
Lead Channel Pacing Threshold Pulse Width: 0.4 ms
Lead Channel Pacing Threshold Pulse Width: 0.4 ms
Lead Channel Sensing Intrinsic Amplitude: 3.25 mV
Lead Channel Sensing Intrinsic Amplitude: 5.125 mV
Lead Channel Sensing Intrinsic Amplitude: 7.25 mV
Lead Channel Sensing Intrinsic Amplitude: 9 mV
Lead Channel Setting Pacing Amplitude: 1.5 V
Lead Channel Setting Pacing Amplitude: 2.25 V
Lead Channel Setting Pacing Amplitude: 2.5 V
Lead Channel Setting Pacing Pulse Width: 0.4 ms
Lead Channel Setting Pacing Pulse Width: 0.8 ms
Lead Channel Setting Sensing Sensitivity: 0.3 mV

## 2017-02-23 ENCOUNTER — Telehealth (HOSPITAL_COMMUNITY): Payer: Self-pay

## 2017-02-23 NOTE — Telephone Encounter (Signed)
Patient left VM on CHF clinic triage line asking if she should continue increased dose of lisinopril at 10 mg. Dr. Haroldine Laws recently increased from 5 to 10 mg once daily and advised to have labs drawn in 2 weeks to assess potassium level (Hx hyperkalemia). Patients most recent potassium 5.3. Attempted to return call to advise to reduce her lisinopril dose to 5 mg and have bmet rechecked in 2 weeks, no answer, no VM available. Will attempt to return call again later.  Renee Pain, RN

## 2017-02-25 ENCOUNTER — Other Ambulatory Visit (HOSPITAL_COMMUNITY): Payer: Self-pay | Admitting: *Deleted

## 2017-02-25 MED ORDER — LISINOPRIL 10 MG PO TABS: 10.0000 mg | ORAL_TABLET | Freq: Every day | ORAL | 3 refills | Status: DC

## 2017-02-25 NOTE — Telephone Encounter (Signed)
Pt has had repeat labs at [facility], results are in [place]:  6/22 K 4.6 7/13 K 4.6  Per Doroteo Bradford, Pharm D pt needs to continue Lisinopril 10 mg daily.  Attempted to call pt and Left message to call back

## 2017-03-01 NOTE — Telephone Encounter (Signed)
Call placed to Pt.  Asked if we had all the materials she needed faxed in order for her to drive.  Pt states that was all she received this time, last year she had much more materials to fill out.  Per Pt just needs a statement from Dr. Lovena Le saying OK to drive.  Will f/u with Dr. Lovena Le and fax requested info.

## 2017-03-02 ENCOUNTER — Telehealth: Payer: Self-pay | Admitting: Internal Medicine

## 2017-03-02 NOTE — Telephone Encounter (Signed)
Patient aware [employer] paper ready for pick up.

## 2017-03-22 ENCOUNTER — Other Ambulatory Visit (HOSPITAL_COMMUNITY): Payer: Self-pay

## 2017-03-22 ENCOUNTER — Telehealth (HOSPITAL_COMMUNITY): Payer: Self-pay

## 2017-03-22 MED ORDER — FUROSEMIDE 40 MG PO TABS: ORAL_TABLET | ORAL | 3 refills | Status: DC

## 2017-03-22 NOTE — Telephone Encounter (Signed)
Those are appropriate interventions. If swelling persists can take lasix 40 bid for 2-3 days as needed to improve. If not improving can move appt up. If improving, ok to leave as is. Thanks.

## 2017-03-22 NOTE — Telephone Encounter (Signed)
Patient calling to report oncologist has decreased allopurinol to 100 mg once daily (was on 300 mg once daily). Also reports recent increase in swelling and SOB, weight stable, and oncologist has increased lasix to 40 mg one day alternating with 20 mg the next day continuously (was on 40 mg once every other day). Oncologist to repeat lab work as recent [place] showed acute anemia after chemo (HGB 9) and questions SOB acute anemia vs fluid status. Will forward to Dr. Haroldine Laws to advise and see if patient needs to be seen sooner (due to be seen in November with echocardiogram).  Renee Pain, RN

## 2017-03-23 NOTE — Telephone Encounter (Signed)
Patient made aware of Dr. Clayborne Dana recommendations and will call our office to update [place] on CBC recheck in 2 weeks and will also notify us if s/s do not resolve with increase in diuretics. Patient transferred to CHF scheduling line to arrange her echo and follow up apt in November with Dr. Haroldine Laws.  Renee Pain, RN

## 2017-04-19 ENCOUNTER — Ambulatory Visit (INDEPENDENT_AMBULATORY_CARE_PROVIDER_SITE_OTHER): Payer: Medicare Other | Admitting: *Deleted

## 2017-04-19 ENCOUNTER — Telehealth: Payer: Self-pay | Admitting: Cardiology

## 2017-04-19 DIAGNOSIS — I469 Cardiac arrest, cause unspecified: Secondary | ICD-10-CM | POA: Diagnosis not present

## 2017-04-19 NOTE — Telephone Encounter (Signed)
Spoke with pt and reminded pt of remote transmission that is due today. Pt verbalized understanding.   

## 2017-04-20 LAB — CUP PACEART REMOTE DEVICE CHECK
Battery Remaining Longevity: 95 mo
Battery Voltage: 3 V
Brady Statistic AP VP Percent: 0.13 %
Brady Statistic AP VS Percent: 0.02 %
Brady Statistic AS VP Percent: 98.38 %
Brady Statistic AS VS Percent: 1.46 %
Brady Statistic RA Percent Paced: 0.15 %
Brady Statistic RV Percent Paced: 4.94 %
Date Time Interrogation Session: 20180911014651
HighPow Impedance: 59 Ohm
Implantable Lead Implant Date: 20170612
Implantable Lead Implant Date: 20170612
Implantable Lead Implant Date: 20170612
Implantable Lead Location: 753858
Implantable Lead Location: 753859
Implantable Lead Location: 753860
Implantable Lead Model: 4398
Implantable Lead Model: 5076
Implantable Pulse Generator Implant Date: 20170612
Lead Channel Impedance Value: 143.419
Lead Channel Impedance Value: 168.683
Lead Channel Impedance Value: 175.644
Lead Channel Impedance Value: 208.174
Lead Channel Impedance Value: 218.88 Ohm
Lead Channel Impedance Value: 247 Ohm
Lead Channel Impedance Value: 247 Ohm
Lead Channel Impedance Value: 304 Ohm
Lead Channel Impedance Value: 342 Ohm
Lead Channel Impedance Value: 475 Ohm
Lead Channel Impedance Value: 532 Ohm
Lead Channel Impedance Value: 532 Ohm
Lead Channel Impedance Value: 608 Ohm
Lead Channel Impedance Value: 665 Ohm
Lead Channel Impedance Value: 722 Ohm
Lead Channel Impedance Value: 760 Ohm
Lead Channel Impedance Value: 760 Ohm
Lead Channel Impedance Value: 893 Ohm
Lead Channel Pacing Threshold Amplitude: 0.75 V
Lead Channel Pacing Threshold Amplitude: 1 V
Lead Channel Pacing Threshold Amplitude: 1.125 V
Lead Channel Pacing Threshold Pulse Width: 0.4 ms
Lead Channel Pacing Threshold Pulse Width: 0.4 ms
Lead Channel Pacing Threshold Pulse Width: 0.8 ms
Lead Channel Sensing Intrinsic Amplitude: 1.625 mV
Lead Channel Sensing Intrinsic Amplitude: 1.625 mV
Lead Channel Sensing Intrinsic Amplitude: 7 mV
Lead Channel Sensing Intrinsic Amplitude: 7 mV
Lead Channel Setting Pacing Amplitude: 1.75 V
Lead Channel Setting Pacing Amplitude: 2 V
Lead Channel Setting Pacing Amplitude: 2.5 V
Lead Channel Setting Pacing Pulse Width: 0.4 ms
Lead Channel Setting Pacing Pulse Width: 0.8 ms
Lead Channel Setting Sensing Sensitivity: 0.3 mV

## 2017-04-20 NOTE — Progress Notes (Signed)
Remote ICD transmission.   

## 2017-04-21 ENCOUNTER — Encounter: Payer: Self-pay | Admitting: Cardiology

## 2017-06-24 ENCOUNTER — Ambulatory Visit (HOSPITAL_BASED_OUTPATIENT_CLINIC_OR_DEPARTMENT_OTHER)
Admission: RE | Admit: 2017-06-24 | Discharge: 2017-06-24 | Disposition: A | Discharge status: Home / Self Care | Payer: Medicare Other | Source: Ambulatory Visit | Attending: Internal Medicine | Admitting: Internal Medicine

## 2017-06-24 ENCOUNTER — Encounter (HOSPITAL_COMMUNITY): Payer: Self-pay | Admitting: Internal Medicine

## 2017-06-24 ENCOUNTER — Ambulatory Visit (HOSPITAL_COMMUNITY)
Admission: RE | Admit: 2017-06-24 | Discharge: 2017-06-24 | Disposition: A | Discharge status: Home / Self Care | Payer: Medicare Other | Source: Ambulatory Visit | Attending: Family Medicine | Admitting: Family Medicine

## 2017-06-24 ENCOUNTER — Other Ambulatory Visit: Payer: Self-pay

## 2017-06-24 VITALS — BP 140/72 | HR 66 | Wt 246.5 lb

## 2017-06-24 DIAGNOSIS — Z7982 Long term (current) use of aspirin: Secondary | ICD-10-CM | POA: Insufficient documentation

## 2017-06-24 DIAGNOSIS — I998 Other disorder of circulatory system: Secondary | ICD-10-CM | POA: Insufficient documentation

## 2017-06-24 DIAGNOSIS — I4901 Ventricular fibrillation: Secondary | ICD-10-CM | POA: Insufficient documentation

## 2017-06-24 DIAGNOSIS — Z8674 Personal history of sudden cardiac arrest: Secondary | ICD-10-CM | POA: Insufficient documentation

## 2017-06-24 DIAGNOSIS — R591 Generalized enlarged lymph nodes: Secondary | ICD-10-CM | POA: Insufficient documentation

## 2017-06-24 DIAGNOSIS — E039 Hypothyroidism, unspecified: Secondary | ICD-10-CM | POA: Diagnosis not present

## 2017-06-24 DIAGNOSIS — I2510 Atherosclerotic heart disease of native coronary artery without angina pectoris: Secondary | ICD-10-CM

## 2017-06-24 DIAGNOSIS — I469 Cardiac arrest, cause unspecified: Secondary | ICD-10-CM

## 2017-06-24 DIAGNOSIS — Z85820 Personal history of malignant melanoma of skin: Secondary | ICD-10-CM | POA: Diagnosis not present

## 2017-06-24 DIAGNOSIS — I110 Hypertensive heart disease with heart failure: Secondary | ICD-10-CM | POA: Diagnosis not present

## 2017-06-24 DIAGNOSIS — E785 Hyperlipidemia, unspecified: Secondary | ICD-10-CM | POA: Diagnosis not present

## 2017-06-24 DIAGNOSIS — Z87440 Personal history of urinary (tract) infections: Secondary | ICD-10-CM | POA: Insufficient documentation

## 2017-06-24 DIAGNOSIS — Z95810 Presence of automatic (implantable) cardiac defibrillator: Secondary | ICD-10-CM | POA: Insufficient documentation

## 2017-06-24 DIAGNOSIS — I890 Lymphedema, not elsewhere classified: Secondary | ICD-10-CM | POA: Insufficient documentation

## 2017-06-24 DIAGNOSIS — I10 Essential (primary) hypertension: Secondary | ICD-10-CM

## 2017-06-24 DIAGNOSIS — Z955 Presence of coronary angioplasty implant and graft: Secondary | ICD-10-CM | POA: Diagnosis not present

## 2017-06-24 DIAGNOSIS — I5022 Chronic systolic (congestive) heart failure: Secondary | ICD-10-CM

## 2017-06-24 DIAGNOSIS — I5023 Acute on chronic systolic (congestive) heart failure: Secondary | ICD-10-CM | POA: Diagnosis present

## 2017-06-24 DIAGNOSIS — Z79899 Other long term (current) drug therapy: Secondary | ICD-10-CM | POA: Insufficient documentation

## 2017-06-24 DIAGNOSIS — I447 Left bundle-branch block, unspecified: Secondary | ICD-10-CM | POA: Insufficient documentation

## 2017-06-24 NOTE — Addendum Note (Signed)
Encounter addended by: Scarlette Calico, RN on: 06/24/2017 12:02 PM  Actions taken: Order list changed, Diagnosis association updated, Sign clinical note

## 2017-06-24 NOTE — Patient Instructions (Signed)
You have been referred to general cardiology at [facility], they will call you in about 1 year for an appointment.

## 2017-06-24 NOTE — Progress Notes (Signed)
  Echocardiogram 2D Echocardiogram has been performed.  Lisa [name] F 06/24/2017, 11:07 AM
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
## 2017-07-19 ENCOUNTER — Ambulatory Visit (INDEPENDENT_AMBULATORY_CARE_PROVIDER_SITE_OTHER): Payer: Medicare Other | Admitting: *Deleted

## 2017-07-19 DIAGNOSIS — I469 Cardiac arrest, cause unspecified: Secondary | ICD-10-CM | POA: Diagnosis not present

## 2017-07-20 NOTE — Progress Notes (Signed)
Remote ICD transmission.   

## 2017-07-22 LAB — CUP PACEART REMOTE DEVICE CHECK
Battery Remaining Longevity: 90 mo
Battery Voltage: 3 V
Brady Statistic AP VP Percent: 0.13 %
Brady Statistic AP VS Percent: 0.02 %
Brady Statistic AS VP Percent: 98.22 %
Brady Statistic AS VS Percent: 1.63 %
Brady Statistic RA Percent Paced: 0.15 %
Brady Statistic RV Percent Paced: 3.36 %
Date Time Interrogation Session: 20181210122406
HighPow Impedance: 56 Ohm
Implantable Lead Implant Date: 20170612
Implantable Lead Implant Date: 20170612
Implantable Lead Implant Date: 20170612
Implantable Lead Location: 753858
Implantable Lead Location: 753859
Implantable Lead Location: 753860
Implantable Lead Model: 4398
Implantable Lead Model: 5076
Implantable Pulse Generator Implant Date: 20170612
Lead Channel Impedance Value: 147.097
Lead Channel Impedance Value: 187.841
Lead Channel Impedance Value: 195.911
Lead Channel Impedance Value: 199.5 Ohm
Lead Channel Impedance Value: 208.627
Lead Channel Impedance Value: 247 Ohm
Lead Channel Impedance Value: 285 Ohm
Lead Channel Impedance Value: 304 Ohm
Lead Channel Impedance Value: 304 Ohm
Lead Channel Impedance Value: 456 Ohm
Lead Channel Impedance Value: 513 Ohm
Lead Channel Impedance Value: 551 Ohm
Lead Channel Impedance Value: 665 Ohm
Lead Channel Impedance Value: 703 Ohm
Lead Channel Impedance Value: 779 Ohm
Lead Channel Impedance Value: 779 Ohm
Lead Channel Impedance Value: 779 Ohm
Lead Channel Impedance Value: 893 Ohm
Lead Channel Pacing Threshold Amplitude: 0.75 V
Lead Channel Pacing Threshold Amplitude: 0.875 V
Lead Channel Pacing Threshold Amplitude: 1.5 V
Lead Channel Pacing Threshold Pulse Width: 0.4 ms
Lead Channel Pacing Threshold Pulse Width: 0.4 ms
Lead Channel Pacing Threshold Pulse Width: 0.8 ms
Lead Channel Sensing Intrinsic Amplitude: 3.125 mV
Lead Channel Sensing Intrinsic Amplitude: 3.125 mV
Lead Channel Sensing Intrinsic Amplitude: 5.125 mV
Lead Channel Sensing Intrinsic Amplitude: 5.125 mV
Lead Channel Setting Pacing Amplitude: 1.5 V
Lead Channel Setting Pacing Amplitude: 2 V
Lead Channel Setting Pacing Amplitude: 2.5 V
Lead Channel Setting Pacing Pulse Width: 0.4 ms
Lead Channel Setting Pacing Pulse Width: 0.8 ms
Lead Channel Setting Sensing Sensitivity: 0.3 mV

## 2017-07-23 ENCOUNTER — Encounter: Payer: Self-pay | Admitting: Cardiology

## 2017-10-29 ENCOUNTER — Telehealth: Payer: Self-pay | Admitting: Cardiology

## 2017-10-29 ENCOUNTER — Ambulatory Visit (INDEPENDENT_AMBULATORY_CARE_PROVIDER_SITE_OTHER): Payer: Medicare Other | Admitting: *Deleted

## 2017-10-29 DIAGNOSIS — I469 Cardiac arrest, cause unspecified: Secondary | ICD-10-CM

## 2017-10-29 NOTE — Telephone Encounter (Signed)
Spoke with pt and reminded pt of remote transmission that is due today. Pt verbalized understanding.   

## 2017-10-29 NOTE — Progress Notes (Signed)
Remote ICD transmission.   

## 2017-11-01 ENCOUNTER — Encounter: Payer: Self-pay | Admitting: Cardiology

## 2017-11-01 LAB — CUP PACEART REMOTE DEVICE CHECK
Battery Remaining Longevity: 85 mo
Battery Voltage: 2.98 V
Brady Statistic AP VP Percent: 0.09 %
Brady Statistic AP VS Percent: 0.02 %
Brady Statistic AS VP Percent: 98.28 %
Brady Statistic AS VS Percent: 1.6 %
Brady Statistic RA Percent Paced: 0.11 %
Brady Statistic RV Percent Paced: 4.22 %
Date Time Interrogation Session: 20190322151027
HighPow Impedance: 50 Ohm
Implantable Lead Implant Date: 20170612
Implantable Lead Implant Date: 20170612
Implantable Lead Implant Date: 20170612
Implantable Lead Location: 753858
Implantable Lead Location: 753859
Implantable Lead Location: 753860
Implantable Lead Model: 4398
Implantable Lead Model: 5076
Implantable Pulse Generator Implant Date: 20170612
Lead Channel Impedance Value: 132.321
Lead Channel Impedance Value: 168.683
Lead Channel Impedance Value: 170.548
Lead Channel Impedance Value: 185.581
Lead Channel Impedance Value: 187.841
Lead Channel Impedance Value: 247 Ohm
Lead Channel Impedance Value: 247 Ohm
Lead Channel Impedance Value: 285 Ohm
Lead Channel Impedance Value: 304 Ohm
Lead Channel Impedance Value: 475 Ohm
Lead Channel Impedance Value: 513 Ohm
Lead Channel Impedance Value: 532 Ohm
Lead Channel Impedance Value: 551 Ohm
Lead Channel Impedance Value: 665 Ohm
Lead Channel Impedance Value: 665 Ohm
Lead Channel Impedance Value: 703 Ohm
Lead Channel Impedance Value: 722 Ohm
Lead Channel Impedance Value: 779 Ohm
Lead Channel Pacing Threshold Amplitude: 0.75 V
Lead Channel Pacing Threshold Amplitude: 0.75 V
Lead Channel Pacing Threshold Amplitude: 1.5 V
Lead Channel Pacing Threshold Pulse Width: 0.4 ms
Lead Channel Pacing Threshold Pulse Width: 0.4 ms
Lead Channel Pacing Threshold Pulse Width: 0.8 ms
Lead Channel Sensing Intrinsic Amplitude: 2.125 mV
Lead Channel Sensing Intrinsic Amplitude: 2.125 mV
Lead Channel Sensing Intrinsic Amplitude: 5.875 mV
Lead Channel Sensing Intrinsic Amplitude: 5.875 mV
Lead Channel Setting Pacing Amplitude: 1.75 V
Lead Channel Setting Pacing Amplitude: 2 V
Lead Channel Setting Pacing Amplitude: 2.5 V
Lead Channel Setting Pacing Pulse Width: 0.4 ms
Lead Channel Setting Pacing Pulse Width: 0.8 ms
Lead Channel Setting Sensing Sensitivity: 0.3 mV

## 2018-01-27 ENCOUNTER — Encounter: Payer: Self-pay | Admitting: Internal Medicine

## 2018-01-31 ENCOUNTER — Ambulatory Visit (INDEPENDENT_AMBULATORY_CARE_PROVIDER_SITE_OTHER): Payer: Medicare Other | Admitting: *Deleted

## 2018-01-31 DIAGNOSIS — I469 Cardiac arrest, cause unspecified: Secondary | ICD-10-CM | POA: Diagnosis not present

## 2018-01-31 NOTE — Progress Notes (Signed)
Remote ICD transmission.   

## 2018-02-01 ENCOUNTER — Encounter: Payer: Self-pay | Admitting: Cardiology

## 2018-02-02 LAB — CUP PACEART REMOTE DEVICE CHECK
Battery Remaining Longevity: 77 mo
Battery Voltage: 2.98 V
Brady Statistic AP VP Percent: 0.15 %
Brady Statistic AP VS Percent: 0.03 %
Brady Statistic AS VP Percent: 97.82 %
Brady Statistic AS VS Percent: 2 %
Brady Statistic RA Percent Paced: 0.18 %
Brady Statistic RV Percent Paced: 5.49 %
Date Time Interrogation Session: 20190624071703
HighPow Impedance: 56 Ohm
Implantable Lead Implant Date: 20170612
Implantable Lead Implant Date: 20170612
Implantable Lead Implant Date: 20170612
Implantable Lead Location: 753858
Implantable Lead Location: 753859
Implantable Lead Location: 753860
Implantable Lead Model: 4398
Implantable Lead Model: 5076
Implantable Pulse Generator Implant Date: 20170612
Lead Channel Impedance Value: 143.419
Lead Channel Impedance Value: 166.725
Lead Channel Impedance Value: 175.644
Lead Channel Impedance Value: 205.2 Ohm
Lead Channel Impedance Value: 218.88 Ohm
Lead Channel Impedance Value: 247 Ohm
Lead Channel Impedance Value: 247 Ohm
Lead Channel Impedance Value: 304 Ohm
Lead Channel Impedance Value: 342 Ohm
Lead Channel Impedance Value: 456 Ohm
Lead Channel Impedance Value: 513 Ohm
Lead Channel Impedance Value: 513 Ohm
Lead Channel Impedance Value: 608 Ohm
Lead Channel Impedance Value: 703 Ohm
Lead Channel Impedance Value: 760 Ohm
Lead Channel Impedance Value: 760 Ohm
Lead Channel Impedance Value: 760 Ohm
Lead Channel Impedance Value: 874 Ohm
Lead Channel Pacing Threshold Amplitude: 0.75 V
Lead Channel Pacing Threshold Amplitude: 0.875 V
Lead Channel Pacing Threshold Amplitude: 1.5 V
Lead Channel Pacing Threshold Pulse Width: 0.4 ms
Lead Channel Pacing Threshold Pulse Width: 0.4 ms
Lead Channel Pacing Threshold Pulse Width: 0.8 ms
Lead Channel Sensing Intrinsic Amplitude: 1 mV
Lead Channel Sensing Intrinsic Amplitude: 1 mV
Lead Channel Sensing Intrinsic Amplitude: 5.5 mV
Lead Channel Sensing Intrinsic Amplitude: 5.5 mV
Lead Channel Setting Pacing Amplitude: 1.5 V
Lead Channel Setting Pacing Amplitude: 2.5 V
Lead Channel Setting Pacing Amplitude: 2.5 V
Lead Channel Setting Pacing Pulse Width: 0.4 ms
Lead Channel Setting Pacing Pulse Width: 0.8 ms
Lead Channel Setting Sensing Sensitivity: 0.3 mV

## 2018-02-14 ENCOUNTER — Encounter: Payer: Self-pay | Admitting: Internal Medicine

## 2018-02-28 ENCOUNTER — Encounter: Payer: Self-pay | Admitting: Internal Medicine

## 2018-02-28 ENCOUNTER — Ambulatory Visit (INDEPENDENT_AMBULATORY_CARE_PROVIDER_SITE_OTHER): Payer: Medicare Other | Admitting: Internal Medicine

## 2018-02-28 VITALS — BP 122/74 | HR 71 | Ht 62.0 in | Wt 255.2 lb

## 2018-02-28 DIAGNOSIS — I4901 Ventricular fibrillation: Secondary | ICD-10-CM | POA: Diagnosis not present

## 2018-02-28 DIAGNOSIS — I5022 Chronic systolic (congestive) heart failure: Secondary | ICD-10-CM | POA: Diagnosis not present

## 2018-02-28 DIAGNOSIS — I255 Ischemic cardiomyopathy: Secondary | ICD-10-CM | POA: Diagnosis not present

## 2018-02-28 DIAGNOSIS — Z95810 Presence of automatic (implantable) cardiac defibrillator: Secondary | ICD-10-CM | POA: Diagnosis not present

## 2018-02-28 LAB — CUP PACEART INCLINIC DEVICE CHECK
Battery Remaining Longevity: 62 mo
Battery Voltage: 2.99 V
Brady Statistic AP VP Percent: 0.12 %
Brady Statistic AP VS Percent: 0.03 %
Brady Statistic AS VP Percent: 98.16 %
Brady Statistic AS VS Percent: 1.69 %
Brady Statistic RA Percent Paced: 0.15 %
Brady Statistic RV Percent Paced: 4.51 %
Date Time Interrogation Session: 20190722105113
HighPow Impedance: 58 Ohm
Implantable Lead Implant Date: 20170612
Implantable Lead Implant Date: 20170612
Implantable Lead Implant Date: 20170612
Implantable Lead Location: 753858
Implantable Lead Location: 753859
Implantable Lead Location: 753860
Implantable Lead Model: 4398
Implantable Lead Model: 5076
Implantable Pulse Generator Implant Date: 20170612
Lead Channel Impedance Value: 147.097
Lead Channel Impedance Value: 187.841
Lead Channel Impedance Value: 194.043
Lead Channel Impedance Value: 195.911
Lead Channel Impedance Value: 202.667
Lead Channel Impedance Value: 247 Ohm
Lead Channel Impedance Value: 285 Ohm
Lead Channel Impedance Value: 304 Ohm
Lead Channel Impedance Value: 342 Ohm
Lead Channel Impedance Value: 399 Ohm
Lead Channel Impedance Value: 513 Ohm
Lead Channel Impedance Value: 551 Ohm
Lead Channel Impedance Value: 608 Ohm
Lead Channel Impedance Value: 703 Ohm
Lead Channel Impedance Value: 722 Ohm
Lead Channel Impedance Value: 760 Ohm
Lead Channel Impedance Value: 760 Ohm
Lead Channel Impedance Value: 836 Ohm
Lead Channel Pacing Threshold Amplitude: 0.5 V
Lead Channel Pacing Threshold Amplitude: 1 V
Lead Channel Pacing Threshold Amplitude: 1.5 V
Lead Channel Pacing Threshold Pulse Width: 0.4 ms
Lead Channel Pacing Threshold Pulse Width: 0.4 ms
Lead Channel Pacing Threshold Pulse Width: 0.8 ms
Lead Channel Sensing Intrinsic Amplitude: 2.625 mV
Lead Channel Sensing Intrinsic Amplitude: 5 mV
Lead Channel Setting Pacing Amplitude: 1.5 V
Lead Channel Setting Pacing Amplitude: 2.5 V
Lead Channel Setting Pacing Amplitude: 2.75 V
Lead Channel Setting Pacing Pulse Width: 0.4 ms
Lead Channel Setting Pacing Pulse Width: 0.8 ms
Lead Channel Setting Sensing Sensitivity: 0.3 mV

## 2018-02-28 MED ORDER — NITROGLYCERIN 0.4 MG SL SUBL: 0.4000 mg | SUBLINGUAL_TABLET | SUBLINGUAL | 3 refills | Status: DC | PRN

## 2018-02-28 NOTE — Progress Notes (Addendum)
HPI Lisa [name] returns today for ongoing followup after her biv ICD insertion. 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 Her EF was low and has been so for a period of time. The patient denies chest pain but she is sedentary. She chronic lymphedema. She has class 3 heart failure but is limited by her increased weight and lymphedema. She has started exercising and complete cardiac rehab. She still has dyspnea with exertion. She underwent repeat echo and her EF has normalized since her BiV upgrade. She admits to dietary indiscretion.  Allergies  Allergen Reactions  . Flagyl [Metronidazole] Itching and Rash    Welts, itching  . Penicillins Hives, Itching and Rash    Has patient had a PCN reaction causing immediate rash, facial/tongue/throat swelling, SOB or lightheadedness with hypotension: Yes  Has patient had a PCN reaction causing severe rash involving mucus membranes or skin necrosis: Yes Has patient had a PCN reaction that required hospitalization No Has patient had a PCN reaction occurring within the last 10 years: NO If all of the above answers are "NO", then may proceed with Cephalosporin use.   . Sulfamethoxazole Rash  . Meperidine Nausea And Vomiting  . Morphine And Related Nausea And Vomiting  . Wheat Bran Other (See Comments)    Runny nose  . Doxycycline Rash    Made her "feel terrible"  . Lanolin Itching     Current Outpatient Medications  Medication Sig Dispense Refill  . acetaminophen (TYLENOL) 500 MG tablet Take 500-1,000 mg by mouth every 6 (six) hours as needed for moderate pain.    [name] allopurinol (ZYLOPRIM) 100 MG tablet Take 1 tablet by mouth daily.    [name] aspirin EC 81 MG tablet Take 81 mg by mouth daily.    [name] atorvastatin (LIPITOR) 20 MG tablet Take 1 tablet (20 mg total) by mouth daily. 90 tablet 3  . carvedilol (COREG) 25 MG tablet Take 1 tablet (25 mg total) by mouth 2 (two) times daily with a meal. 180  tablet 3  . Cholecalciferol (VITAMIN D-3 PO) Take 5,000 Units by mouth daily.    . Coenzyme Q10 (COQ10 PO) Take 1 capsule by mouth daily.    . furosemide (LASIX) 20 MG tablet Take 1 tablet by mouth daily.    [name] imatinib (GLEEVEC) 400 MG tablet Take 400 mg by mouth.    [name] lisinopril (PRINIVIL,ZESTRIL) 10 MG tablet Take 1 tablet (10 mg total) by mouth daily. 90 tablet 3  . Multiple Vitamin (MULTIVITAMIN WITH MINERALS) TABS tablet Take 1 tablet by mouth daily.    . nitroGLYCERIN (NITROSTAT) 0.4 MG SL tablet Place 1 tablet (0.4 mg total) under the tongue every 5 (five) minutes x 3 doses as needed for chest pain. 25 tablet 3  . promethazine-codeine (PHENERGAN WITH CODEINE) 6.25-10 MG/5ML syrup Take 5 mLs by mouth at bedtime as needed for cough. 120 mL 0  . spironolactone (ALDACTONE) 25 MG tablet Take 25 mg daily by mouth.    . thyroid (ARMOUR) 15 MG tablet Take 15 mg by mouth daily.     No current facility-administered medications for this visit.      Past Medical History:  Diagnosis Date  . Cardiac arrest ([place]) 09/2015  . CHF (congestive heart failure) ([place])   . Complication of anesthesia   . Hx: UTI (urinary tract infection)   . Hypertension   . Melanoma ([place])   . PONV (  postoperative nausea and vomiting)   . PVC (premature ventricular contraction)    HISTORY OF  . Thyroid disease     ROS:   All systems reviewed and negative except as noted in the HPI.   Past Surgical History:  Procedure Laterality Date  . ABDOMINAL HYSTERECTOMY    . APPENDECTOMY    . CARDIAC CATHETERIZATION N/A 10/03/2015   Procedure: Left Heart Cath and Coronary Angiography;  Surgeon: [name], MD;  Location: [facility];  Service: Cardiovascular;  Laterality: N/A;  . CARDIAC CATHETERIZATION N/A 10/07/2015   Procedure: Coronary Stent Intervention;  Surgeon: [name], MD;  Location: [facility];  Service: Cardiovascular;  Laterality: N/A;  . EP IMPLANTABLE DEVICE N/A 01/20/2016    Procedure: BiV ICD Insertion CRT-D;  Surgeon: [name], MD;  Location: [facility];  Service: Cardiovascular;  Laterality: N/A;  . TONSILLECTOMY    . TONSILLECTOMY    . TUBAL LIGATION       Family History  Problem Relation Age of Onset  . Coronary artery disease Mother   . Thyroid disease Mother   . Multiple myeloma Father   . Heart attack Maternal Grandfather   . Heart attack Paternal Grandfather      Social History   Socioeconomic History  . Marital status: Divorced    Spouse name: Not on file  . Number of children: Not on file  . Years of education: Not on file  . Highest education level: Not on file  Occupational History  . Not on file  Social Needs  . Financial resource strain: Not on file  . Food insecurity:    Worry: Not on file    Inability: Not on file  . Transportation needs:    Medical: Not on file    Non-medical: Not on file  Tobacco Use  . Smoking status: Never Smoker  . Smokeless tobacco: Never Used  Substance and Sexual Activity  . Alcohol use: No    Alcohol/week: 0.0 oz  . Drug use: No  . Sexual activity: Not on file  Lifestyle  . Physical activity:    Days per week: Not on file    Minutes per session: Not on file  . Stress: Not on file  Relationships  . Social connections:    Talks on phone: Not on file    Gets together: Not on file    Attends religious service: Not on file    Active member of club or organization: Not on file    Attends meetings of clubs or organizations: Not on file    Relationship status: Not on file  . Intimate partner violence:    Fear of current or ex partner: Not on file    Emotionally abused: Not on file    Physically abused: Not on file    Forced sexual activity: Not on file  Other Topics Concern  . Not on file  Social History Narrative  . Not on file     BP 122/74   Pulse 71   Ht '5\' 2"'  (1.575 m)   Wt 255 lb 3.2 oz (115.8 kg)   LMP 02/14/1995   SpO2 99%   BMI 46.68 kg/m   Physical  Exam:  Well appearing NAD HEENT: Unremarkable Neck:  No JVD, no thyromegally Lymphatics:  No adenopathy Back:  No CVA tenderness Lungs:  Clear with no wheezes HEART:  Regular rate rhythm, no murmurs, no rubs, no clicks Abd:  soft, positive  bowel sounds, no organomegally, no rebound, no guarding Ext:  2 plus pulses, 3+ lymphedema, no cyanosis, no clubbing Skin:  No rashes no nodules Neuro:  CN II through XII intact, motor grossly intact  EKG - NSR with biv pacing, QRS 90!  DEVICE  Normal device function.  See PaceArt for details.   Assess/Plan: 1. Chronic systolic heart failure - her EF was normal by echo 7 months ago. She will continue her current meds. Her fluid index has been elevated and I asked her to lose weight and eat less sodium.  2. ICD - her medtronic BiV ICD is working well. Her leads are stable and QRS is very short. 3. 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 4. 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 When she reaches ERI I would anticipate that she continue with her ICD despite normalization of LV function due to the prior VF she has sustained.   Lisa [name].D.

## 2018-02-28 NOTE — Patient Instructions (Addendum)
Medication Instructions:  The current medical regimen is effective;  continue present plan and medications.  Follow-Up: Follow up in 1 year with Dr. Lovena Le.  You will receive a letter in the mail 2 months before you are due.  Please call us when you receive this letter to schedule your follow up appointment.  Remote monitoring is used to monitor your Pacemaker of ICD from home. This monitoring reduces the number of office visits required to check your device to one time per year. It allows [place] to keep an eye on the functioning of your device to ensure it is working properly. You are scheduled for a device check from home on 05/02/18. You may send your transmission at any time that day. If you have a wireless device, the transmission will be sent automatically. After your physician reviews your transmission, you will receive a postcard with your next transmission date.  If you need a refill on your cardiac medications before your next appointment, please call your pharmacy.  Thank you for choosing [facility]!!

## 2018-04-15 ENCOUNTER — Telehealth: Payer: Self-pay | Admitting: Internal Medicine
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
## 2018-04-18 ENCOUNTER — Ambulatory Visit: Payer: Medicare Other | Admitting: Internal Medicine

## 2018-04-19 ENCOUNTER — Ambulatory Visit: Payer: Medicare Other | Admitting: Internal Medicine

## 2018-04-19 NOTE — Telephone Encounter (Signed)
Noted  

## 2018-05-02 ENCOUNTER — Ambulatory Visit (INDEPENDENT_AMBULATORY_CARE_PROVIDER_SITE_OTHER): Payer: Medicare Other | Admitting: *Deleted

## 2018-05-02 DIAGNOSIS — I255 Ischemic cardiomyopathy: Secondary | ICD-10-CM | POA: Diagnosis not present

## 2018-05-02 DIAGNOSIS — I5022 Chronic systolic (congestive) heart failure: Secondary | ICD-10-CM

## 2018-05-02 NOTE — Progress Notes (Signed)
Remote ICD transmission.   

## 2018-05-03 ENCOUNTER — Encounter: Payer: Self-pay | Admitting: Cardiology

## 2018-05-09 ENCOUNTER — Encounter: Payer: Medicare Other | Admitting: Internal Medicine

## 2018-05-20 LAB — CUP PACEART REMOTE DEVICE CHECK
Battery Remaining Longevity: 76 mo
Battery Voltage: 2.98 V
Brady Statistic AP VP Percent: 0.11 %
Brady Statistic AP VS Percent: 0.03 %
Brady Statistic AS VP Percent: 97.98 %
Brady Statistic AS VS Percent: 1.87 %
Brady Statistic RA Percent Paced: 0.15 %
Brady Statistic RV Percent Paced: 6.27 %
Date Time Interrogation Session: 20190923062823
HighPow Impedance: 63 Ohm
Implantable Lead Implant Date: 20170612
Implantable Lead Implant Date: 20170612
Implantable Lead Implant Date: 20170612
Implantable Lead Location: 753858
Implantable Lead Location: 753859
Implantable Lead Location: 753860
Implantable Lead Model: 4398
Implantable Lead Model: 5076
Implantable Pulse Generator Implant Date: 20170612
Lead Channel Impedance Value: 159.265
Lead Channel Impedance Value: 192.065
Lead Channel Impedance Value: 202.788
Lead Channel Impedance Value: 223.82 Ohm
Lead Channel Impedance Value: 238.518
Lead Channel Impedance Value: 247 Ohm
Lead Channel Impedance Value: 285 Ohm
Lead Channel Impedance Value: 342 Ohm
Lead Channel Impedance Value: 361 Ohm
Lead Channel Impedance Value: 418 Ohm
Lead Channel Impedance Value: 532 Ohm
Lead Channel Impedance Value: 589 Ohm
Lead Channel Impedance Value: 703 Ohm
Lead Channel Impedance Value: 722 Ohm
Lead Channel Impedance Value: 779 Ohm
Lead Channel Impedance Value: 817 Ohm
Lead Channel Impedance Value: 836 Ohm
Lead Channel Impedance Value: 950 Ohm
Lead Channel Pacing Threshold Amplitude: 0.625 V
Lead Channel Pacing Threshold Amplitude: 0.875 V
Lead Channel Pacing Threshold Amplitude: 1.75 V
Lead Channel Pacing Threshold Pulse Width: 0.4 ms
Lead Channel Pacing Threshold Pulse Width: 0.4 ms
Lead Channel Pacing Threshold Pulse Width: 0.8 ms
Lead Channel Sensing Intrinsic Amplitude: 2.625 mV
Lead Channel Sensing Intrinsic Amplitude: 2.625 mV
Lead Channel Sensing Intrinsic Amplitude: 9.75 mV
Lead Channel Sensing Intrinsic Amplitude: 9.75 mV
Lead Channel Setting Pacing Amplitude: 1.5 V
Lead Channel Setting Pacing Amplitude: 2.25 V
Lead Channel Setting Pacing Amplitude: 2.5 V
Lead Channel Setting Pacing Pulse Width: 0.4 ms
Lead Channel Setting Pacing Pulse Width: 0.8 ms
Lead Channel Setting Sensing Sensitivity: 0.3 mV

## 2018-05-24 ENCOUNTER — Encounter: Payer: Self-pay | Admitting: Internal Medicine

## 2018-05-24 ENCOUNTER — Ambulatory Visit (INDEPENDENT_AMBULATORY_CARE_PROVIDER_SITE_OTHER): Payer: Medicare Other | Admitting: Internal Medicine

## 2018-05-24 VITALS — BP 98/54 | HR 94 | Ht 61.0 in | Wt 250.8 lb

## 2018-05-24 DIAGNOSIS — I890 Lymphedema, not elsewhere classified: Secondary | ICD-10-CM

## 2018-05-24 DIAGNOSIS — I5022 Chronic systolic (congestive) heart failure: Secondary | ICD-10-CM

## 2018-05-24 DIAGNOSIS — I10 Essential (primary) hypertension: Secondary | ICD-10-CM | POA: Diagnosis not present

## 2018-05-24 DIAGNOSIS — I4901 Ventricular fibrillation: Secondary | ICD-10-CM | POA: Diagnosis not present

## 2018-05-24 DIAGNOSIS — I255 Ischemic cardiomyopathy: Secondary | ICD-10-CM

## 2018-05-24 MED ORDER — POTASSIUM CHLORIDE ER 10 MEQ PO TBCR: EXTENDED_RELEASE_TABLET | ORAL | 1 refills | Status: DC

## 2018-05-24 NOTE — Progress Notes (Signed)
Cardiology Office Note   Date:  05/24/2018   ID:  Lisa [name], DOB [birth year], MRN [id number]  PCP:  [name], MD  Cardiologist:   [name], MD       History of Present Illness: Lisa [name] is a 77 y.o. female with a history of morbid obesity, HTN, HL, hypothroidism, LBBB, chronic lymphedema, systolic CHF (LVEF 25 to 77%) due to mixed NICM/ICM, VFib arrest   and CAD    She was last seen by [name] in Nov 2018   She is also followed by [name] (s/P ICD palacement in 2017)  Not lisinopril stopped in past due to hyperkalemia   Echo in November 2018, LVEF had normalized   Current Meds  Medication Sig  . acetaminophen (TYLENOL) 500 MG tablet Take 500-1,000 mg by mouth every 6 (six) hours as needed for moderate pain.  [name] allopurinol (ZYLOPRIM) 100 MG tablet Take 1 tablet by mouth daily.  [name] aspirin EC 81 MG tablet Take 81 mg by mouth daily.  [name] atorvastatin (LIPITOR) 20 MG tablet Take 1 tablet (20 mg total) by mouth daily.  [name] CALCIUM PO Take 1 tablet by mouth daily.  . carvedilol (COREG) 25 MG tablet Take 1 tablet (25 mg total) by mouth 2 (two) times daily with a meal.  . Cholecalciferol (VITAMIN D-3 PO) Take 5,000 Units by mouth daily.  . Coenzyme Q10 (COQ10 PO) Take 1 capsule by mouth daily.  . Cyanocobalamin (VITAMIN B-12 PO) Take 1 tablet by mouth daily.  . furosemide (LASIX) 20 MG tablet Take 1 tablet by mouth daily.  [name] imatinib (GLEEVEC) 400 MG tablet Take 400 mg by mouth.  [name] lisinopril (PRINIVIL,ZESTRIL) 10 MG tablet Take 1 tablet (10 mg total) by mouth daily.  [name] MAGNESIUM PO Take 1 tablet by mouth daily.  . Multiple Vitamin (MULTIVITAMIN WITH MINERALS) TABS tablet Take 1 tablet by mouth daily.  . nitroGLYCERIN (NITROSTAT) 0.4 MG SL tablet Place 1 tablet (0.4 mg total) under the tongue every 5 (five) minutes x 3 doses as needed for chest pain.  . promethazine-codeine (PHENERGAN WITH CODEINE) 6.25-10 MG/5ML syrup Take 5 mLs by mouth at bedtime as needed  for cough.  [name] spironolactone (ALDACTONE) 25 MG tablet Take 25 mg daily by mouth.  . thyroid (ARMOUR) 15 MG tablet Take 15 mg by mouth daily.     Allergies:   Flagyl [metronidazole]; Penicillins; Sulfamethoxazole; Meperidine; Morphine and related; Wheat bran; Doxycycline; and Lanolin   Past Medical History:  Diagnosis Date  . Cardiac arrest ([place]) 09/2015  . CHF (congestive heart failure) ([place])   . Complication of anesthesia   . Hx: UTI (urinary tract infection)   . Hypertension   . Melanoma ([place])   . PONV (postoperative nausea and vomiting)   . PVC (premature ventricular contraction)    HISTORY OF  . Thyroid disease     Past Surgical History:  Procedure Laterality Date  . ABDOMINAL HYSTERECTOMY    . APPENDECTOMY    . CARDIAC CATHETERIZATION N/A 10/03/2015   Procedure: Left Heart Cath and Coronary Angiography;  Surgeon: [name], MD;  Location: [facility];  Service: Cardiovascular;  Laterality: N/A;  . CARDIAC CATHETERIZATION N/A 10/07/2015   Procedure: Coronary Stent Intervention;  Surgeon: [name], MD;  Location: [facility];  Service: Cardiovascular;  Laterality: N/A;  . EP IMPLANTABLE DEVICE N/A 01/20/2016   Procedure: BiV ICD Insertion CRT-D;  Surgeon: [name], MD;  Location: [facility]  IN[facility];  Service: Cardiovascular;  Laterality: N/A;  . TONSILLECTOMY    . TONSILLECTOMY    . TUBAL LIGATION       Social History:  The patient  reports that she has never smoked. She has never used smokeless tobacco. She reports that she does not drink alcohol or use drugs.   Family History:  The patient's family history includes Coronary artery disease in her mother; Heart attack in her maternal grandfather and paternal grandfather; Multiple myeloma in her father; Thyroid disease in her mother.    ROS:  Please see the history of present illness. All other systems are reviewed and  Negative to the above problem except as noted.    PHYSICAL  EXAM: VS:  BP (!) 98/54   Pulse 94   Ht '5\' 1"'  (1.549 m)   Wt 250 lb 12.8 oz (113.8 kg)   LMP 02/14/1995   SpO2 98%   BMI 47.39 kg/m   GEN: Morbidly obese 77 yo in no acute distress  HEENT: normal  Neck: no JVD, carotid bruits, or masses Cardiac: RRR; no murmurs, rubs, or gallops,chornic lympmhedema   Respiratory:  clear to auscultation bilaterally, normal work of breathing GI: soft, nontender, nondistended, + BS  No hepatomegaly  MS: no deformity Moving all extremities   Skin: warm and dry, no rash Neuro:  Strength and sensation are intact Psych: euthymic mood, full affect   EKG:  EKG is not ordered today.   Lipid Panel    Component Value Date/Time   CHOL 105 10/04/2015 0329   TRIG 91 10/04/2015 0329   HDL 25 (L) 10/04/2015 0329   CHOLHDL 4.2 10/04/2015 0329   VLDL 18 10/04/2015 0329   LDLCALC 62 10/04/2015 0329      Wt Readings from Last 3 Encounters:  05/24/18 250 lb 12.8 oz (113.8 kg)  02/28/18 255 lb 3.2 oz (115.8 kg)  06/24/17 246 lb 8 oz (111.8 kg)      ASSESSMENT AND PLAN:  1  Hx chronic systoiic CHF   Pt now s/p CRT D   LVEF has normalzied    Volume is difficult with lymphedema   She feels it is sometimes up    I have recomm that she take 40 Lasix with 10 of KCL on these   Keep salt under control  2   CAD  80% LAD stensosi with PCI in 2017   I am not convinced of active angina  3  Hx VF arrest   S/p Medtronid CRT-D  4  Lymphedema   Elevate lages  5  HTN  Pt's BP is a little low but she denies dizzines   Follow  [name]  Current medicines are reviewed at length with the patient today.  The patient does not have concerns regarding medicines.  Signed, [name], MD  05/24/2018 2:59 PM    [address], [place], [place]  [address] Phone: [phone]; Fax: [phone]

## 2018-05-24 NOTE — Patient Instructions (Addendum)
Medication Instructions:  Your physician recommends that you continue on your current medications as directed. Please refer to the Current Medication list given to you today.  If you need a refill on your cardiac medications before your next appointment, please call your pharmacy.   Lab work: Please have lipids drawn when you have other blood work with PCP in November. If you have labs (blood work) drawn today and your tests are completely normal, you will receive your results only by: [name] MyChart Message (if you have MyChart) OR . A paper copy in the mail If you have any lab test that is abnormal or we need to change your treatment, we will call you to review the results.  Testing/Procedures: none  Follow-Up: At [facility], you and your health needs are our priority.  As part of our continuing mission to provide you with exceptional heart care, we have created designated Provider Care Teams.  These Care Teams include your primary Cardiologist (physician) and Advanced Practice Providers (APPs -  Physician Assistants and Nurse Practitioners) who all work together to provide you with the care you need, when you need it. You will need a follow up appointment in:  6 months.  Please call our office 2 months in advance to schedule this appointment.  You may see [name], MD or one of the following Advanced Practice Providers on your designated Care Team: [name], PA-C [place], [place] . Daune Perch, NP  Any Other Special Instructions Will Be Listed Below (If Applicable). none  Addendum: per Dr. Harrington Challenger patient is to take potassium chloride 10 meq on days when she takes an extra furosemide.  Prescription sent to [facility] and patient is aware.

## 2018-05-25 ENCOUNTER — Telehealth: Payer: Self-pay | Admitting: Internal Medicine

## 2018-05-25 NOTE — Telephone Encounter (Signed)
[facility] is calling to verify "Potassium" medication for the above patient. Furosemide has not been filled since 2018. Pls call and advise.

## 2018-05-26 NOTE — Telephone Encounter (Signed)
Spoke with [facility] who asked for clarification of directions for potassium, since furosemide has not filled there since 2018. Advised that according to patient she is taking lasix and may be filling with a different pharmacy.  Adv how she was instructed by Dr. Harrington Challenger that occas she may take one extra lasix tablet and on those days she is to take one potassium tablet.

## 2018-06-20 ENCOUNTER — Ambulatory Visit: Payer: Medicare Other | Admitting: Internal Medicine

## 2018-08-01 ENCOUNTER — Ambulatory Visit (INDEPENDENT_AMBULATORY_CARE_PROVIDER_SITE_OTHER): Payer: Medicare Other

## 2018-08-01 DIAGNOSIS — I5022 Chronic systolic (congestive) heart failure: Secondary | ICD-10-CM | POA: Diagnosis not present

## 2018-08-01 DIAGNOSIS — I255 Ischemic cardiomyopathy: Secondary | ICD-10-CM

## 2018-08-01 NOTE — Progress Notes (Signed)
Remote ICD transmission.   

## 2018-08-02 LAB — CUP PACEART REMOTE DEVICE CHECK
Battery Remaining Longevity: 77 mo
Battery Voltage: 2.89 V
Brady Statistic AP VP Percent: 0.33 %
Brady Statistic AP VS Percent: 0.01 %
Brady Statistic AS VP Percent: 97.9 %
Brady Statistic AS VS Percent: 1.76 %
Brady Statistic RA Percent Paced: 0.34 %
Brady Statistic RV Percent Paced: 2.75 %
Date Time Interrogation Session: 20191223073326
HighPow Impedance: 55 Ohm
Implantable Lead Implant Date: 20170612
Implantable Lead Implant Date: 20170612
Implantable Lead Implant Date: 20170612
Implantable Lead Location: 753858
Implantable Lead Location: 753859
Implantable Lead Location: 753860
Implantable Lead Model: 4398
Implantable Lead Model: 5076
Implantable Pulse Generator Implant Date: 20170612
Lead Channel Impedance Value: 155.455
Lead Channel Impedance Value: 185.581
Lead Channel Impedance Value: 194.043
Lead Channel Impedance Value: 208.174
Lead Channel Impedance Value: 218.88 Ohm
Lead Channel Impedance Value: 247 Ohm
Lead Channel Impedance Value: 285 Ohm
Lead Channel Impedance Value: 342 Ohm
Lead Channel Impedance Value: 342 Ohm
Lead Channel Impedance Value: 361 Ohm
Lead Channel Impedance Value: 513 Ohm
Lead Channel Impedance Value: 532 Ohm
Lead Channel Impedance Value: 608 Ohm
Lead Channel Impedance Value: 703 Ohm
Lead Channel Impedance Value: 722 Ohm
Lead Channel Impedance Value: 722 Ohm
Lead Channel Impedance Value: 760 Ohm
Lead Channel Impedance Value: 836 Ohm
Lead Channel Pacing Threshold Amplitude: 0.625 V
Lead Channel Pacing Threshold Amplitude: 0.75 V
Lead Channel Pacing Threshold Amplitude: 1.25 V
Lead Channel Pacing Threshold Pulse Width: 0.4 ms
Lead Channel Pacing Threshold Pulse Width: 0.4 ms
Lead Channel Pacing Threshold Pulse Width: 0.8 ms
Lead Channel Sensing Intrinsic Amplitude: 1.875 mV
Lead Channel Sensing Intrinsic Amplitude: 1.875 mV
Lead Channel Sensing Intrinsic Amplitude: 9.125 mV
Lead Channel Sensing Intrinsic Amplitude: 9.125 mV
Lead Channel Setting Pacing Amplitude: 1.5 V
Lead Channel Setting Pacing Amplitude: 1.75 V
Lead Channel Setting Pacing Amplitude: 2.5 V
Lead Channel Setting Pacing Pulse Width: 0.4 ms
Lead Channel Setting Pacing Pulse Width: 0.8 ms
Lead Channel Setting Sensing Sensitivity: 0.3 mV

## 2018-08-04 ENCOUNTER — Telehealth: Payer: Self-pay | Admitting: *Deleted

## 2018-08-04 DIAGNOSIS — I472 Ventricular tachycardia, unspecified: Secondary | ICD-10-CM

## 2018-08-04 NOTE — Telephone Encounter (Signed)
Dr. Caryl Comes would like for the patient to come in and have a BMP, Mag and Troponin drawn.  I have left a message for patient to call and set up appointment for lab work tomorrow.  The orders are in, just need to schedule appointment for labs.
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
## 2018-08-05 NOTE — Telephone Encounter (Signed)
Patient called and stated that she can not go get the lab work b/c she is not in town and she is not sure if she will be home by 5:00. After consulting with the Device Tech RN I informed pt that it would be ok to wait until Monday 08-08-2018 to get lab work done. Patient agreed to an lab appt for Monday 08-08-2018.

## 2018-08-08 ENCOUNTER — Other Ambulatory Visit: Payer: Medicare Other

## 2018-08-11 ENCOUNTER — Ambulatory Visit (INDEPENDENT_AMBULATORY_CARE_PROVIDER_SITE_OTHER): Payer: Medicare Other | Admitting: Nurse Practitioner

## 2018-08-11 ENCOUNTER — Other Ambulatory Visit: Payer: Medicare Other

## 2018-08-11 ENCOUNTER — Encounter: Payer: Self-pay | Admitting: Nurse Practitioner

## 2018-08-11 VITALS — BP 134/72 | HR 69 | Ht 61.0 in | Wt 255.2 lb

## 2018-08-11 DIAGNOSIS — I2510 Atherosclerotic heart disease of native coronary artery without angina pectoris: Secondary | ICD-10-CM

## 2018-08-11 DIAGNOSIS — I10 Essential (primary) hypertension: Secondary | ICD-10-CM

## 2018-08-11 DIAGNOSIS — I4901 Ventricular fibrillation: Secondary | ICD-10-CM | POA: Diagnosis not present

## 2018-08-11 DIAGNOSIS — I5022 Chronic systolic (congestive) heart failure: Secondary | ICD-10-CM

## 2018-08-11 NOTE — Patient Instructions (Signed)
Medication Instructions:  none If you need a refill on your cardiac medications before your next appointment, please call your pharmacy.   Lab work:TODAY MAGNESIUM BMET If you have labs (blood work) drawn today and your tests are completely normal, you will receive your results only by: [name] MyChart Message (if you have MyChart) OR . A paper copy in the mail If you have any lab test that is abnormal or we need to change your treatment, we will call you to review the results.  Testing/Procedures: NONE  Follow-Up: PLEASE SCHEDULE WITH Dr Harrington Challenger in April   At [facility], you and your health needs are our priority.  As part of our continuing mission to provide you with exceptional heart care, we have created designated Provider Care Teams.  These Care Teams include your primary Cardiologist (physician) and Advanced Practice Providers (APPs -  Physician Assistants and Nurse Practitioners) who all work together to provide you with the care you need, when you need it. .   Any Other Special Instructions Will Be Listed Below (If Applicable).

## 2018-08-11 NOTE — Progress Notes (Signed)
Electrophysiology Office Note Date: 08/11/2018  ID:  Lisa [name], DOB 1941-03-27, MRN [id number]  PCP: [name], MD Primary Cardiologist: Harrington Challenger Electrophysiologist: Lovena Le  CC: Routine ICD follow-up  Lisa [name] is a 78 y.o. female seen today for Dr Lovena Le.  She presents today for routine electrophysiology followup.  Since last being seen in our clinic, the patient reports doing very well. She had ICD shock 12/22 of which she was unaware. She does report missing some doss of Coreg.   She denies chest pain, palpitations, dyspnea, PND, orthopnea, nausea, vomiting, dizziness, syncope, edema, weight gain, or early satiety.    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 Cardiac arrest ([place]) 09/2015  . CHF (congestive heart failure) ([place])   . Complication of anesthesia   . Hx: UTI (urinary tract infection)   . Hypertension   . Melanoma ([place])   . PONV (postoperative nausea and vomiting)   . PVC (premature ventricular contraction)    HISTORY OF  . Thyroid disease    Past Surgical History:  Procedure Laterality Date  . ABDOMINAL HYSTERECTOMY    . APPENDECTOMY    . CARDIAC CATHETERIZATION N/A 10/03/2015   Procedure: Left Heart Cath and Coronary Angiography;  Surgeon: [name], MD;  Location: [facility];  Service: Cardiovascular;  Laterality: N/A;  . CARDIAC CATHETERIZATION N/A 10/07/2015   Procedure: Coronary Stent Intervention;  Surgeon: [name], MD;  Location: [facility];  Service: Cardiovascular;  Laterality: N/A;  . EP IMPLANTABLE DEVICE N/A 01/20/2016   Procedure: BiV ICD Insertion CRT-D;  Surgeon: [name], MD;  Location: [facility];  Service: Cardiovascular;  Laterality: N/A;  . TONSILLECTOMY    . TONSILLECTOMY    . TUBAL LIGATION      Current Outpatient Medications  Medication  Sig Dispense Refill  . acetaminophen (TYLENOL) 500 MG tablet Take 500-1,000 mg by mouth every 6 (six) hours as needed for moderate pain.    [name] allopurinol (ZYLOPRIM) 100 MG tablet Take 1 tablet by mouth daily.    [name] aspirin EC 81 MG tablet Take 81 mg by mouth daily.    [name] atorvastatin (LIPITOR) 20 MG tablet Take 1 tablet (20 mg total) by mouth daily. 90 tablet 3  . CALCIUM PO Take 1 tablet by mouth daily.    . carvedilol (COREG) 25 MG tablet Take 1 tablet (25 mg total) by mouth 2 (two) times daily with a meal. 180 tablet 3  . Cholecalciferol (VITAMIN D-3 PO) Take 5,000 Units by mouth daily.    . Coenzyme Q10 (COQ10 PO) Take 1 capsule by mouth daily.    . Cyanocobalamin (VITAMIN B-12 PO) Take 1 tablet by mouth daily.    . furosemide (LASIX) 20 MG tablet Take 1 tablet by mouth daily.    [name] imatinib (GLEEVEC) 400 MG tablet Take 400 mg by mouth.    [name] lisinopril (PRINIVIL,ZESTRIL) 10 MG tablet Take 1 tablet (10 mg total) by mouth daily. 90 tablet 3  . MAGNESIUM PO Take 1 tablet by mouth daily.    . Multiple Vitamin (MULTIVITAMIN WITH MINERALS) TABS tablet Take 1 tablet by mouth daily.    . nitroGLYCERIN (NITROSTAT) 0.4 MG SL tablet Place 1 tablet (0.4 mg total) under the tongue every 5 (five) minutes x 3 doses as needed for chest pain. 25  tablet 3  . potassium chloride (K-DUR) 10 MEQ tablet Take one tablet as directed only on days when EXTRA dose of furosemide is taken 30 tablet 1  . spironolactone (ALDACTONE) 25 MG tablet Take 25 mg daily by mouth.    . thyroid (ARMOUR) 15 MG tablet Take 15 mg by mouth daily.     No current facility-administered medications for this visit.     Allergies:   Flagyl [metronidazole]; Penicillins; Sulfamethoxazole; Meperidine; Morphine and related; Wheat bran; Doxycycline; and Lanolin   Social History: Social History   Socioeconomic History  . Marital status: Divorced    Spouse name: Not on file  . Number of children: Not on file  . Years of education: Not on file   . Highest education level: Not on file  Occupational History  . Not on file  Social Needs  . Financial resource strain: Not on file  . Food insecurity:    Worry: Not on file    Inability: Not on file  . Transportation needs:    Medical: Not on file    Non-medical: Not on file  Tobacco Use  . Smoking status: Never Smoker  . Smokeless tobacco: Never Used  Substance and Sexual Activity  . Alcohol use: No    Alcohol/week: 0.0 standard drinks  . Drug use: No  . Sexual activity: Not on file  Lifestyle  . Physical activity:    Days per week: Not on file    Minutes per session: Not on file  . Stress: Not on file  Relationships  . Social connections:    Talks on phone: Not on file    Gets together: Not on file    Attends religious service: Not on file    Active member of club or organization: Not on file    Attends meetings of clubs or organizations: Not on file    Relationship status: Not on file  . Intimate partner violence:    Fear of current or ex partner: Not on file    Emotionally abused: Not on file    Physically abused: Not on file    Forced sexual activity: Not on file  Other Topics Concern  . Not on file  Social History Narrative  . Not on file    Family History: Family History  Problem Relation Age of Onset  . Coronary artery disease Mother   . Thyroid disease Mother   . Multiple myeloma Father   . Heart attack Maternal Grandfather   . Heart attack Paternal Grandfather     Review of Systems: All other systems reviewed and are otherwise negative except as noted above.   Physical Exam: VS:  BP 134/72   Pulse 69   Ht '5\' 1"'  (1.549 m)   Wt 255 lb 3.2 oz (115.8 kg)   LMP 02/14/1995   SpO2 95%   BMI 48.22 kg/m  , BMI Body mass index is 48.22 kg/m.  GEN- The patient is elderly and obese appearing, alert and oriented x 3 today.   HEENT: normocephalic, atraumatic; sclera clear, conjunctiva pink; hearing intact; oropharynx clear; neck supple Lungs- Clear  to ausculation bilaterally, normal work of breathing.  No wheezes, rales, rhonchi Heart- Regular rate and rhythm GI- soft, non-tender, non-distended, bowel sounds present, no hepatosplenomegaly Extremities- no clubbing, cyanosis MS- no significant deformity or atrophy Skin- warm and dry, no rash or lesion; ICD pocket well healed Psych- euthymic mood, full affect Neuro- strength and sensation are intact  ICD interrogation- reviewed in detail  today,  See PACEART report   Recent Labs: No results found for requested labs within last 8760 hours.   Wt Readings from Last 3 Encounters:  08/11/18 255 lb 3.2 oz (115.8 kg)  05/24/18 250 lb 12.8 oz (113.8 kg)  02/28/18 255 lb 3.2 oz (115.8 kg)     Other studies Reviewed: Additional studies/ records that were reviewed today include: Dr Harrington Challenger and Dr Tanna Furry office notes   Assessment and Plan:  1.  Chronic systolic dysfunction euvolemic today Stable on an appropriate medical regimen Normal ICD function See Pace Art report No changes today  2.  VF with appropriate HV therapy 08/01/18 No driving x6 months BMET, Mg today Compliance with Coreg encouraged Consider AAD if recurrent ventricular arrhythmias  3.  CAD No recent ischemic symptoms Continue current therapy   4.  HTN Stable No change required today   Current medicines are reviewed at length with the patient today.   The patient does not have concerns regarding her medicines.  The following changes were made today:  none  Labs/ tests ordered today include: BMET, Mg Orders Placed This Encounter  Procedures  . Basic metabolic panel  . Magnesium  . EKG 12-Lead     Disposition:   Follow up with [facility], Dr Lovena Le 6 months       Signed, Chanetta Marshall, NP 08/11/2018 6:09 PM  [facility] [address] [place] [address] [phone] (office) [phone] (fax)

## 2018-08-12 LAB — BASIC METABOLIC PANEL
BUN/Creatinine Ratio: 11 — ABNORMAL LOW (ref 12–28)
BUN: 12 mg/dL (ref 8–27)
CO2: 22 mmol/L (ref 20–29)
Calcium: 8.9 mg/dL (ref 8.7–10.3)
Chloride: 105 mmol/L (ref 96–106)
Creatinine, Ser: 1.1 mg/dL — ABNORMAL HIGH (ref 0.57–1.00)
GFR calc Af Amer: 56 mL/min/{1.73_m2} — ABNORMAL LOW (ref >59)
GFR calc non Af Amer: 49 mL/min/{1.73_m2} — ABNORMAL LOW (ref >59)
Glucose: 111 mg/dL — ABNORMAL HIGH (ref 65–99)
Potassium: 4.1 mmol/L (ref 3.5–5.2)
Sodium: 143 mmol/L (ref 134–144)

## 2018-08-12 LAB — MAGNESIUM: Magnesium: 1.9 mg/dL (ref 1.6–2.3)

## 2018-10-24 ENCOUNTER — Telehealth: Payer: Self-pay | Admitting: Internal Medicine

## 2018-10-24 MED ORDER — SPIRONOLACTONE 25 MG PO TABS: 25.0000 mg | ORAL_TABLET | Freq: Every day | ORAL | 3 refills | Status: DC

## 2018-10-24 NOTE — Telephone Encounter (Signed)
New message  Pt c/o medication issue:  1. Name of Medication:spironolactone (ALDACTONE) 25 MG tablet  2. How are you currently taking this medication (dosage and times per day)? 1 time daily  3. Are you having a reaction (difficulty breathing--STAT)? no  4. What is your medication issue? Patient wants to know if she should still take this medication and if she does need to take this medication she needs a prescription for a 90 day supply sent to [facility].

## 2018-10-24 NOTE — Telephone Encounter (Signed)
I spoke to the patient and she requested that her Spironolactone is refilled.  I took care of that for her.  She was inquiring about an appt with Dr Harrington Challenger that it supposed to be scheduled in April.  She is awaiting a call.

## 2018-10-24 NOTE — Telephone Encounter (Signed)
Noted.  A 6 month recall had been sent.  Thank you.

## 2018-10-31 ENCOUNTER — Other Ambulatory Visit: Payer: Self-pay

## 2018-10-31 ENCOUNTER — Ambulatory Visit (INDEPENDENT_AMBULATORY_CARE_PROVIDER_SITE_OTHER): Payer: Medicare Other | Admitting: *Deleted

## 2018-10-31 DIAGNOSIS — I5022 Chronic systolic (congestive) heart failure: Secondary | ICD-10-CM | POA: Diagnosis not present

## 2018-10-31 DIAGNOSIS — I469 Cardiac arrest, cause unspecified: Secondary | ICD-10-CM

## 2018-11-01 LAB — CUP PACEART REMOTE DEVICE CHECK
Battery Remaining Longevity: 58 mo
Battery Voltage: 2.98 V
Brady Statistic AP VP Percent: 0.1 %
Brady Statistic AP VS Percent: 0.03 %
Brady Statistic AS VP Percent: 97.48 %
Brady Statistic AS VS Percent: 2.39 %
Brady Statistic RA Percent Paced: 0.13 %
Brady Statistic RV Percent Paced: 7.08 %
Date Time Interrogation Session: 20200323052305
HighPow Impedance: 67 Ohm
Implantable Lead Implant Date: 20170612
Implantable Lead Implant Date: 20170612
Implantable Lead Implant Date: 20170612
Implantable Lead Location: 753858
Implantable Lead Location: 753859
Implantable Lead Location: 753860
Implantable Lead Model: 4398
Implantable Lead Model: 5076
Implantable Pulse Generator Implant Date: 20170612
Lead Channel Impedance Value: 1007 Ohm
Lead Channel Impedance Value: 143.419
Lead Channel Impedance Value: 175.644
Lead Channel Impedance Value: 186.415
Lead Channel Impedance Value: 218.88 Ohm
Lead Channel Impedance Value: 235.862
Lead Channel Impedance Value: 247 Ohm
Lead Channel Impedance Value: 304 Ohm
Lead Channel Impedance Value: 342 Ohm
Lead Channel Impedance Value: 361 Ohm
Lead Channel Impedance Value: 475 Ohm
Lead Channel Impedance Value: 513 Ohm
Lead Channel Impedance Value: 608 Ohm
Lead Channel Impedance Value: 722 Ohm
Lead Channel Impedance Value: 760 Ohm
Lead Channel Impedance Value: 836 Ohm
Lead Channel Impedance Value: 874 Ohm
Lead Channel Impedance Value: 893 Ohm
Lead Channel Pacing Threshold Amplitude: 0.625 V
Lead Channel Pacing Threshold Amplitude: 0.75 V
Lead Channel Pacing Threshold Amplitude: 1.375 V
Lead Channel Pacing Threshold Pulse Width: 0.4 ms
Lead Channel Pacing Threshold Pulse Width: 0.4 ms
Lead Channel Pacing Threshold Pulse Width: 0.8 ms
Lead Channel Sensing Intrinsic Amplitude: 1 mV
Lead Channel Sensing Intrinsic Amplitude: 1 mV
Lead Channel Sensing Intrinsic Amplitude: 17.75 mV
Lead Channel Sensing Intrinsic Amplitude: 17.75 mV
Lead Channel Setting Pacing Amplitude: 1.5 V
Lead Channel Setting Pacing Amplitude: 2.5 V
Lead Channel Setting Pacing Amplitude: 3 V
Lead Channel Setting Pacing Pulse Width: 0.4 ms
Lead Channel Setting Pacing Pulse Width: 0.8 ms
Lead Channel Setting Sensing Sensitivity: 0.3 mV

## 2018-11-07 ENCOUNTER — Telehealth: Payer: Self-pay | Admitting: Physician Assistant

## 2018-11-07 NOTE — Telephone Encounter (Signed)
   Primary Cardiologist:  [name], MD   Patient contacted.  History reviewed.  No symptoms to suggest any unstable cardiac conditions.  Based on discussion, with current pandemic situation, we will be postponing this appointment for Lisa [name] with a plan for f/u in 3-4 months or sooner if feasible/necessary. Patient is not interested in virtual office visit. If symptoms change, she has been instructed to contact our office.   Routing to C19 CANCEL pool for tracking (P CV DIV CV19 CANCEL - reason for visit "other.") and assigning priority (1 = 4-6 wks, 2 = 6-12 wks, 3 = >12 wks).   [place], [place]  11/07/2018 1:23 PM         .

## 2018-11-08 NOTE — Progress Notes (Signed)
Remote ICD transmission.   

## 2018-11-21 ENCOUNTER — Ambulatory Visit: Payer: Medicare Other | Admitting: Physician Assistant

## 2018-11-24 NOTE — Telephone Encounter (Signed)
Call placed to pt to r/s appt that was cancelled due to covid 19.  Pt declined virtual visit and per [employer], PA'C's note, will place pt on 3 months recall. Pt also advised that she will call the office in June to check on the scheduling update Pt thanked me for calling,

## 2019-01-30 ENCOUNTER — Ambulatory Visit (INDEPENDENT_AMBULATORY_CARE_PROVIDER_SITE_OTHER): Payer: Medicare Other | Admitting: *Deleted

## 2019-01-30 DIAGNOSIS — I5022 Chronic systolic (congestive) heart failure: Secondary | ICD-10-CM | POA: Diagnosis not present

## 2019-01-30 DIAGNOSIS — I469 Cardiac arrest, cause unspecified: Secondary | ICD-10-CM

## 2019-01-30 LAB — CUP PACEART REMOTE DEVICE CHECK
Battery Remaining Longevity: 69 mo
Battery Voltage: 2.91 V
Brady Statistic AP VP Percent: 0.11 %
Brady Statistic AP VS Percent: 0.04 %
Brady Statistic AS VP Percent: 98.01 %
Brady Statistic AS VS Percent: 1.85 %
Brady Statistic RA Percent Paced: 0.15 %
Brady Statistic RV Percent Paced: 4 %
Date Time Interrogation Session: 20200622103626
HighPow Impedance: 62 Ohm
Implantable Lead Implant Date: 20170612
Implantable Lead Implant Date: 20170612
Implantable Lead Implant Date: 20170612
Implantable Lead Location: 753858
Implantable Lead Location: 753859
Implantable Lead Location: 753860
Implantable Lead Model: 4398
Implantable Lead Model: 5076
Implantable Pulse Generator Implant Date: 20170612
Lead Channel Impedance Value: 147.097
Lead Channel Impedance Value: 187.841
Lead Channel Impedance Value: 195.911
Lead Channel Impedance Value: 199.5 Ohm
Lead Channel Impedance Value: 208.627
Lead Channel Impedance Value: 285 Ohm
Lead Channel Impedance Value: 285 Ohm
Lead Channel Impedance Value: 304 Ohm
Lead Channel Impedance Value: 304 Ohm
Lead Channel Impedance Value: 418 Ohm
Lead Channel Impedance Value: 513 Ohm
Lead Channel Impedance Value: 551 Ohm
Lead Channel Impedance Value: 665 Ohm
Lead Channel Impedance Value: 703 Ohm
Lead Channel Impedance Value: 779 Ohm
Lead Channel Impedance Value: 779 Ohm
Lead Channel Impedance Value: 817 Ohm
Lead Channel Impedance Value: 931 Ohm
Lead Channel Pacing Threshold Amplitude: 0.5 V
Lead Channel Pacing Threshold Amplitude: 0.875 V
Lead Channel Pacing Threshold Amplitude: 1.5 V
Lead Channel Pacing Threshold Pulse Width: 0.4 ms
Lead Channel Pacing Threshold Pulse Width: 0.4 ms
Lead Channel Pacing Threshold Pulse Width: 0.8 ms
Lead Channel Sensing Intrinsic Amplitude: 11.125 mV
Lead Channel Sensing Intrinsic Amplitude: 11.125 mV
Lead Channel Sensing Intrinsic Amplitude: 2.875 mV
Lead Channel Sensing Intrinsic Amplitude: 2.875 mV
Lead Channel Setting Pacing Amplitude: 1.5 V
Lead Channel Setting Pacing Amplitude: 2 V
Lead Channel Setting Pacing Amplitude: 2.5 V
Lead Channel Setting Pacing Pulse Width: 0.4 ms
Lead Channel Setting Pacing Pulse Width: 0.8 ms
Lead Channel Setting Sensing Sensitivity: 0.3 mV

## 2019-02-06 NOTE — Progress Notes (Signed)
Remote ICD transmission.   

## 2019-05-02 ENCOUNTER — Ambulatory Visit (INDEPENDENT_AMBULATORY_CARE_PROVIDER_SITE_OTHER): Payer: Medicare Other | Admitting: *Deleted

## 2019-05-02 DIAGNOSIS — I4901 Ventricular fibrillation: Secondary | ICD-10-CM

## 2019-05-02 DIAGNOSIS — I5022 Chronic systolic (congestive) heart failure: Secondary | ICD-10-CM

## 2019-05-02 LAB — CUP PACEART REMOTE DEVICE CHECK
Battery Remaining Longevity: 65 mo
Battery Voltage: 2.91 V
Brady Statistic AP VP Percent: 0.09 %
Brady Statistic AP VS Percent: 0.03 %
Brady Statistic AS VP Percent: 98.28 %
Brady Statistic AS VS Percent: 1.6 %
Brady Statistic RA Percent Paced: 0.13 %
Brady Statistic RV Percent Paced: 4.62 %
Date Time Interrogation Session: 20200922113725
HighPow Impedance: 54 Ohm
Implantable Lead Implant Date: 20170612
Implantable Lead Implant Date: 20170612
Implantable Lead Implant Date: 20170612
Implantable Lead Location: 753858
Implantable Lead Location: 753859
Implantable Lead Location: 753860
Implantable Lead Model: 4398
Implantable Lead Model: 5076
Implantable Pulse Generator Implant Date: 20170612
Lead Channel Impedance Value: 147.097
Lead Channel Impedance Value: 178.125
Lead Channel Impedance Value: 185.366
Lead Channel Impedance Value: 192.065
Lead Channel Impedance Value: 200.511
Lead Channel Impedance Value: 247 Ohm
Lead Channel Impedance Value: 285 Ohm
Lead Channel Impedance Value: 285 Ohm
Lead Channel Impedance Value: 304 Ohm
Lead Channel Impedance Value: 361 Ohm
Lead Channel Impedance Value: 475 Ohm
Lead Channel Impedance Value: 513 Ohm
Lead Channel Impedance Value: 589 Ohm
Lead Channel Impedance Value: 646 Ohm
Lead Channel Impedance Value: 703 Ohm
Lead Channel Impedance Value: 722 Ohm
Lead Channel Impedance Value: 722 Ohm
Lead Channel Impedance Value: 817 Ohm
Lead Channel Pacing Threshold Amplitude: 0.5 V
Lead Channel Pacing Threshold Amplitude: 0.875 V
Lead Channel Pacing Threshold Amplitude: 1.375 V
Lead Channel Pacing Threshold Pulse Width: 0.4 ms
Lead Channel Pacing Threshold Pulse Width: 0.4 ms
Lead Channel Pacing Threshold Pulse Width: 0.8 ms
Lead Channel Sensing Intrinsic Amplitude: 1.75 mV
Lead Channel Sensing Intrinsic Amplitude: 1.75 mV
Lead Channel Sensing Intrinsic Amplitude: 5.875 mV
Lead Channel Sensing Intrinsic Amplitude: 5.875 mV
Lead Channel Setting Pacing Amplitude: 1.5 V
Lead Channel Setting Pacing Amplitude: 2 V
Lead Channel Setting Pacing Amplitude: 2.5 V
Lead Channel Setting Pacing Pulse Width: 0.4 ms
Lead Channel Setting Pacing Pulse Width: 0.8 ms
Lead Channel Setting Sensing Sensitivity: 0.3 mV

## 2019-05-03 ENCOUNTER — Ambulatory Visit: Payer: Medicare Other | Attending: Family Medicine

## 2019-05-03 DIAGNOSIS — R2689 Other abnormalities of gait and mobility: Secondary | ICD-10-CM | POA: Diagnosis present

## 2019-05-03 DIAGNOSIS — I469 Cardiac arrest, cause unspecified: Secondary | ICD-10-CM | POA: Diagnosis present

## 2019-05-03 DIAGNOSIS — I890 Lymphedema, not elsewhere classified: Secondary | ICD-10-CM

## 2019-05-03 NOTE — Therapy (Signed)
[place], [place], [address] Phone: [phone]   Fax:  [phone]  Physical Therapy Evaluation  Patient Details  Name: Lisa [name] MRN: [id number] Date of Birth: Sep 29, 1940 Referring Provider (PT): Lattie Haw Cassidy-Vu   Encounter Date: 05/03/2019  PT End of Session - 05/03/19 1439    Visit Number  1    Number of Visits  10    Date for PT Re-Evaluation  06/07/19    PT Start Time  1300    PT Stop Time  1400    PT Time Calculation (min)  60 min    Activity Tolerance  Patient tolerated treatment well    Behavior During Therapy  [facility] for tasks assessed/performed       Past Medical History:  Diagnosis Date  . Cardiac arrest ([place]) 09/2015  . CHF (congestive heart failure) ([place])   . Complication of anesthesia   . Hx: UTI (urinary tract infection)   . Hypertension   . Melanoma ([place])   . PONV (postoperative nausea and vomiting)   . PVC (premature ventricular contraction)    HISTORY OF  . Thyroid disease     Past Surgical History:  Procedure Laterality Date  . ABDOMINAL HYSTERECTOMY    . APPENDECTOMY    . CARDIAC CATHETERIZATION N/A 10/03/2015   Procedure: Left Heart Cath and Coronary Angiography;  Surgeon: [name], MD;  Location: [facility];  Service: Cardiovascular;  Laterality: N/A;  . CARDIAC CATHETERIZATION N/A 10/07/2015   Procedure: Coronary Stent Intervention;  Surgeon: [name], MD;  Location: [facility];  Service: Cardiovascular;  Laterality: N/A;  . EP IMPLANTABLE DEVICE N/A 01/20/2016   Procedure: BiV ICD Insertion CRT-D;  Surgeon: [name], MD;  Location: [facility];  Service: Cardiovascular;  Laterality: N/A;  . TONSILLECTOMY    . TONSILLECTOMY    . TUBAL LIGATION      There were no vitals filed for this visit.   Subjective Assessment - 05/03/19 1319    Subjective  Pt reports that she has previously been to physical therapy for  her lymphedema and finished in 2019. Currently she states that she has velcro wraps but is unsure if they fit her becuase she has not tried them in a while. She has previously used short stretch bandages but is unable to do this on her own any longer. Pt states that she was walking in her drive way for 7 continuous minutes but since Covid-19 has not been doing this any longer. She tries to do some leg exercises while sitting to keep her legs moving. Pt states that she tries to elevate her legs when she can but works as a Psychologist, occupational and is on the computer a lot.    Pertinent History  BLE lymphedema, CHF, chornic mylocytic leukemia    Currently in Pain?  Yes    Pain Score  1     Pain Location  Leg    Pain Orientation  Right    Pain Descriptors / Indicators  Aching    Pain Type  Acute pain    Pain Onset  In the past 7 days    Pain Frequency  Intermittent    Aggravating Factors   touching    Pain Relieving Factors  not touching and pain seems to be getting better.    Multiple Pain Sites  No         [facility] PT Assessment - 05/03/19 0001  Assessment   Medical Diagnosis  BLE lymphedema    Referring Provider (PT)  Lattie Haw Cassidy-Vu    Hand Dominance  Right    Next MD Visit  05/12/2019    Prior Therapy  Yes last visit 08/18/2017      Precautions   Precautions  Other (comment)   Leukemia currently     Balance Screen   Has the patient fallen in the past 6 months  No    Has the patient had a decrease in activity level because of a fear of falling?   Yes    Is the patient reluctant to leave their home because of a fear of falling?   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       Cognition   Overall Cognitive Status  Within Functional Limits for tasks assessed      Observation/Other Assessments   Observations  Keratosis, lobules at B anlkes w/shelving, 3+ pitting edema on the dorsum of B feet, skin thickening on B lower legs and posterior lower R thigh.     Skin Integrity  dry and intact         LYMPHEDEMA/ONCOLOGY QUESTIONNAIRE - 05/03/19 1421      Right Lower Extremity Lymphedema   20 cm Proximal to Suprapatella  71.5 cm    10 cm Proximal to Suprapatella  68.4 cm    30 cm Proximal to Floor at Lateral Plantar Foot  70.3 cm    20 cm Proximal to Floor at Lateral Plantar Foot  62.3 1    10  cm Proximal to Floor at Lateral Malleoli  50 cm    5 cm Proximal to 1st MTP Joint  24.8 cm    Across MTP Joint  24.2 cm    Around Proximal Great Toe  10.2 cm      Left Lower Extremity Lymphedema   20 cm Proximal to Suprapatella  70.2 cm    10 cm Proximal to Suprapatella  69.2 cm    30 cm Proximal to Floor at Lateral Plantar Foot  62.3 cm    20 cm Proximal to Floor at Lateral Plantar Foot  64 cm    10 cm Proximal to Floor at Lateral Malleoli  48.8 cm    5 cm Proximal to 1st MTP Joint  23.6 cm    Across MTP Joint  23.6 cm    Around Proximal Great Toe  9.2 cm             [facility]patient [facility]03/[facility]  Lymphedema Life Impact Scale Total Score  38.24 %      Objective measurements completed on examination: See above findings.      [facility] Adult PT Treatment/Exercise - 05/03/19 0001      Exercises   Exercises  Other Exercises    Other Exercises   Pt performed lymphatic sequencing 5x for each movement including cervical flexion/extension, cervical side  bending R/L, cervical rotation R/L, B shoulder flexion, B shoulder shrug, B scapular retraction, Trunk side bending R/L in sitting, Standing hip extension w/UE support, Knee flexion/ext  R/L, Standing heel raises              PT Education - 05/03/19 1427    Education Details  Access Code: [email or web address], Pt was educated on the flow of lympatic fluid and the importance of de-congesting superior lymph in the abdomen/thighs before trying to de-congest inferior lymph in the  lower legs/feet.Discussed risk factors associated with CHF and possible treatment avenues with BLE lymphedema including compression wrapping, ready wraps and doing one LE at a time with less compression to decrease risk for fluid over load. Pt did state that previously she was treating BLE concurrently. Pt was educated on the importance of walking in lymphedema reduction and discussed a walking plan including starting at 7 min and increasing by 1 min until she reaches 10 min then by 2 min until she gets to 30 min.    Person(s) Educated  Patient    Methods  Explanation;Demonstration;Verbal cues;Handout    Comprehension  Verbalized understanding;Returned demonstration       PT Short Term Goals - 05/03/19 1512      PT SHORT TERM GOAL #1   Title  Patient will have reduction of limb girth by 3 cm within 3 weeks    Baseline  see measurements    Time  3    Period  Weeks    Status  New    Target Date  05/31/19      PT SHORT TERM GOAL #2   Title  Patient/caregiver independent in prevention/self-care management principles including self-massage/bandaging and long term management plan for edema.    Baseline  Pt is currently not doing anything for her lymphedema.    Time  3    Period  Weeks    Status  New    Target Date  05/31/19        PT Long Term Goals - 05/03/19 1515      PT LONG TERM GOAL #1   Title  Patient to be properly fitted with compression garment to wear on daily basis.    Baseline  pt currently is not wearing compression    Time  5    Period  Weeks    Status  New    Target Date  06/14/19      PT LONG TERM GOAL #2   Title  Pt will be able to ambulate for 10 minutes or more continuously in  order to decrease risk for immobility related to lymphedema and improve fluid flow of the BLE.    Baseline  pt is able to ambulate for about 7 min per pt report    Time  5    Period  Weeks    Status  New    Target Date  06/14/19             Plan - 05/03/19 1440    Clinical Impression Statement  Pt presents to physical therapy today with an exacerbation of BLE lymphedema. Pt has had lymphedema for mulitple years and has had previous treatment. Her last physical therapy treatment was at the beginning of 2019. Currently she states that she is not as active as she was previously and that she has not been able to use her compression and has not been using her pump. She had a recent cellulitis infection that put her in the hospital.  Pt has an indentation on her R anterior lower leg where she had a melanoma removed causing scarring. Pt will begin a walking program and start exercises. Next session she will bring her wraps to see what type she has and decide if she would rather try wrapping with compression bandages or work with ready wrap. Pt will benefit from skilled physical therapy services in order to address the above limitations including decreasing risk for cellulitis and improving pt mobility.    Personal Factors and Comorbidities  Comorbidity 3+    Comorbidities  Leukemia, CHF, Lymphedema    Stability/Clinical Decision Making  Stable/Uncomplicated    Clinical Decision Making  Moderate    Rehab Potential  Good    PT Frequency  2x / week    PT Duration  Other (comment)   5 weeks   PT Treatment/Interventions  ADLs/Self Care Home Management;Electrical Stimulation;Gait training;Functional mobility training;Therapeutic activities;Therapeutic exercise;Neuromuscular re-education;Patient/family education;Manual lymph drainage;Manual techniques;Vasopneumatic Device    PT Next Visit Plan  Look at velcro wraps, decide on POC, discuss MLD and HEP    Consulted and Agree with Plan of Care  Patient        Patient will benefit from skilled therapeutic intervention in order to improve the following deficits and impairments:  Difficulty walking, Decreased endurance, Decreased activity tolerance, Increased edema, Decreased mobility  Visit Diagnosis: Lymphedema  Cardiac arrest ([place])  Other abnormalities of gait and mobility     Problem List Patient Active Problem List   Diagnosis Date Noted  . Acute bronchitis 11/02/2016  . Unstable angina ([place]) 10/07/2015  . Chronic systolic heart failure ([place])   . PVC's (premature ventricular contractions)   . Ventricular fibrillation ([place]) 10/03/2015  . Cardiac arrest ([place]) 10/03/2015  . Essential hypertension 09/09/2015  . Lymphedema 02/21/2015  . Other specified hypothyroidism 02/21/2015    Ander Purpura, PT 05/03/2019, 3:50 PM  [place], [place], [address] Phone: [phone]   Fax:  [phone]  Name: Lisa [name] MRN: [id number] Date of Birth: Dec 08, 1940

## 2019-05-10 ENCOUNTER — Ambulatory Visit: Payer: Medicare Other

## 2019-05-10 ENCOUNTER — Other Ambulatory Visit: Payer: Self-pay

## 2019-05-10 DIAGNOSIS — I890 Lymphedema, not elsewhere classified: Secondary | ICD-10-CM | POA: Diagnosis not present

## 2019-05-10 DIAGNOSIS — R2689 Other abnormalities of gait and mobility: Secondary | ICD-10-CM

## 2019-05-10 DIAGNOSIS — I469 Cardiac arrest, cause unspecified: Secondary | ICD-10-CM

## 2019-05-10 NOTE — Progress Notes (Signed)
Remote ICD transmission.   

## 2019-05-10 NOTE — Therapy (Signed)
[place], [place], [address] Phone: [phone]   Fax:  [phone]  Physical Therapy Treatment  Patient Details  Name: Lisa [name] MRN: [id number] Date of Birth: 1941/05/06 Referring Provider (PT): Lattie Haw Cassidy-Vu   Encounter Date: 05/10/2019  PT End of Session - 05/10/19 0804    Visit Number  2    Number of Visits  10    Date for PT Re-Evaluation  06/07/19    PT Start Time  0800    PT Stop Time  0910    PT Time Calculation (min)  70 min    Activity Tolerance  Patient tolerated treatment well    Behavior During Therapy  [facility] for tasks assessed/performed       Past Medical History:  Diagnosis Date  . Cardiac arrest ([place]) 09/2015  . CHF (congestive heart failure) ([place])   . Complication of anesthesia   . Hx: UTI (urinary tract infection)   . Hypertension   . Melanoma ([place])   . PONV (postoperative nausea and vomiting)   . PVC (premature ventricular contraction)    HISTORY OF  . Thyroid disease     Past Surgical History:  Procedure Laterality Date  . ABDOMINAL HYSTERECTOMY    . APPENDECTOMY    . CARDIAC CATHETERIZATION N/A 10/03/2015   Procedure: Left Heart Cath and Coronary Angiography;  Surgeon: [name], MD;  Location: [place] [facility];  Service: Cardiovascular;  Laterality: N/A;  . CARDIAC CATHETERIZATION N/A 10/07/2015   Procedure: Coronary Stent Intervention;  Surgeon: [name], MD;  Location: [place] [facility];  Service: Cardiovascular;  Laterality: N/A;  . EP IMPLANTABLE DEVICE N/A 01/20/2016   Procedure: BiV ICD Insertion CRT-D;  Surgeon: [name], MD;  Location: [place] [facility];  Service: Cardiovascular;  Laterality: N/A;  . TONSILLECTOMY    . TONSILLECTOMY    . TUBAL LIGATION      There were no vitals filed for this visit.  Subjective Assessment - 05/10/19 0806    Subjective  Pt states that she ordered some bicycle shorts on-line but is  concerned that they are not providing enough compression. She report that she brought her velcro wraps but she only has one foot piece.    Pertinent History  BLE lymphedema, CHF, chornic mylocytic leukemia    Currently in Pain?  No/denies                  Outpatient Re[facility]  Lymphedema Life Impact Scale Total Score  38.24 %           [facility] Adult PT Treatment/Exercise - 05/10/19 0001      Manual Therapy   Manual Therapy  Manual Lymphatic Drainage (MLD)    Manual Lymphatic Drainage (MLD)  Axillary/inguinal lymph nodes were stimulated. 5x deep breathes, inguino-axillary anastomosis was stimulated followed by full anterior thigh, posterior knee and anteiror lower leg/dorsum of the foot w/step by step instruction and education on correct pressure/direction.                PT Short Term Goals - 05/03/19 1512      PT SHORT TERM GOAL #1   Title  Patient will have reduction of limb girth by 3 cm within 3 weeks    Baseline  see measurements    Time  3    Period  Weeks    Status  New    Target Date  05/31/19      PT SHORT TERM GOAL #2   Title  Patient/caregiver independent in prevention/self-care management principles including self-massage/bandaging and long term management plan for edema.    Baseline  Pt is currently not doing anything for her lymphedema.    Time  3    Period  Weeks    Status  New    Target Date  05/31/19        PT Long Term Goals - 05/03/19 1515      PT LONG TERM GOAL #1   Title  Patient to be properly fitted with compression garment to wear on daily basis.    Baseline  pt currently is not wearing compression    Time  5    Period  Weeks    Status  New    Target Date  06/14/19      PT LONG TERM GOAL #2   Title  Pt will be able to ambulate for 10 minutes or more continuously in order to decrease risk for immobility related to lymphedema and improve fluid flow of the BLE.     Baseline  pt is able to ambulate for about 7 min per pt report    Time  5    Period  Weeks    Status  New    Target Date  06/14/19            Plan - 05/10/19 0803    Clinical Impression Statement  Pt was helped to fit her Farro Wrap onto her LLE. She has a medium and a large. Previously she states the Large went on her RLE and the Medium on her LLE. Currently the Large fits on her LLE and was place; pt educated extensively on removing if she notices any aching, pain, heaviness or swelling in her L foot or toes.  MLD was performed on patient with return demonstration and extra focus on hand placement and pressure. Discussed that if she can only reach her knee that is fine perform MLD to the knee. Pt was wrapped with minimal wrapping to prevent too much fluid movement due to diagnosis of CHF. Roman sandal and ankle-sole-heel performed with 8 cm wrap followed by 2 10 cm wraps on the lower leg and 2 12 cm wrapson the knee and thigh. ABD pad was used at the dorsum of the food for extra compression, elastomull at all but the 5th toe. Artiflex/abd pads were used for shaping and extra cushioning at the popliteal fossa. [place] tubular was used as a [employer] consultant. Pt was educated to keep moving in her wrap and to remove if she has any tightening of the bandage from slippage to prevent a tourniquet effect. Discussed removing the wrap if she has any aching, tingling, numbness and to remove after 2 days. Keep leg elevated as often as possible. Pt will benefit from skilled physical therapy services 3x/week for 4 weeks in order to perform compression wrapping at her BLE starting with the R in order to decrease risk for infection and help pt get into appropriate compression garment.    Personal Factors and Comorbidities  Comorbidity 3+    Comorbidities  Leukemia, CHF, Lymphedema    Stability/Clinical Decision Making  Stable/Uncomplicated    PT Frequency  3x / week    PT Duration  4 weeks    PT  Treatment/Interventions  ADLs/Self Care Home Management;Electrical Stimulation;Gait training;Functional mobility training;Therapeutic activities;Therapeutic exercise;Neuromuscular re-education;Patient/family education;Manual lymph drainage;Manual techniques;Vasopneumatic Device    Consulted and Agree  with Plan of Care  Patient       Patient will benefit from skilled therapeutic intervention in order to improve the following deficits and impairments:  Difficulty walking, Decreased endurance, Decreased activity tolerance, Increased edema, Decreased mobility  Visit Diagnosis: Lymphedema  Cardiac arrest ([place])  Other abnormalities of gait and mobility     Problem List Patient Active Problem List   Diagnosis Date Noted  . Acute bronchitis 11/02/2016  . Unstable angina ([place]) 10/07/2015  . Chronic systolic heart failure ([place])   . PVC's (premature ventricular contractions)   . Ventricular fibrillation ([place]) 10/03/2015  . Cardiac arrest ([place]) 10/03/2015  . Essential hypertension 09/09/2015  . Lymphedema 02/21/2015  . Other specified hypothyroidism 02/21/2015    Ander Purpura, PT 05/10/2019, 11:00 AM  [place], [place], [address] Phone: [phone]   Fax:  [phone]  Name: Lisa [name] MRN: [id number] Date of Birth: 1941/07/11

## 2019-05-10 NOTE — Patient Instructions (Signed)
Deep Effective Breath   Standing, sitting, or laying down place both hands on the belly. Take a deep breath IN, expanding the belly; then breath OUT, contracting the belly. Repeat __5__ times. Do __2-3__ sessions per day and before each self massage.  http://gt2.exer.us/866   Copyright  VHI. All rights reserved.  Inguinal Nodes to Axilla - Clear   On involved side, at armpit, make _5__ in-place circles. Then from hip proceed in sections to armpit with stationary circles or pumps _5_ times, this is your pathway. Do _1__ time per day.  Copyright  VHI. All rights reserved.  LEG: Knee to Hip - Clear   Pump up outer thigh of involved leg from knee to outer hip. Then do stationary circles from inner to outer thigh, then do outer thigh again. Next, interlace fingers behind knee IF ABLE and make in-place circles. Do _5_ times of each sequence.  Do _1__ time per day.  Copyright  VHI. All rights reserved.  LEG: Ankle to Hip Sweep   Hands on sides of ankle of involved leg, pump _5__ times up both sides of lower leg, then retrace steps up outer thigh to hip as before and back to pathway. Do _2-3_ times. Do __1_ time per day.  Copyright  VHI. All rights reserved.  FOOT: Dorsum of Foot and Toes Massage   One hand on top of foot make _5_ stationary circles or pumps, then either on top of toes or each individual toe do _5_ pumps. Then retrace all steps pumping back up both sides of lower leg, outer thigh, and then pathway. Finish with what you started with, _5_ circles at involved side arm pit. All _2-3_ times at each sequence. Do _1__ time per day.  Copyright  VHI. All rights reserved.    

## 2019-05-15 ENCOUNTER — Ambulatory Visit: Payer: Medicare Other | Admitting: Rehabilitation

## 2019-05-17 ENCOUNTER — Encounter: Payer: Medicare Other | Admitting: Rehabilitation

## 2019-05-19 ENCOUNTER — Encounter: Payer: Medicare Other | Admitting: Rehabilitation

## 2019-05-22 ENCOUNTER — Encounter: Payer: Medicare Other | Admitting: Rehabilitation

## 2019-05-24 ENCOUNTER — Encounter: Payer: Medicare Other | Admitting: Rehabilitation

## 2019-05-26 ENCOUNTER — Encounter: Payer: Medicare Other | Admitting: Rehabilitation

## 2019-05-29 ENCOUNTER — Other Ambulatory Visit: Payer: Self-pay

## 2019-05-29 ENCOUNTER — Encounter: Payer: Self-pay | Admitting: Rehabilitation

## 2019-05-29 ENCOUNTER — Ambulatory Visit: Payer: Medicare Other | Attending: Family Medicine | Admitting: Rehabilitation

## 2019-05-29 ENCOUNTER — Ambulatory Visit: Payer: Medicare Other | Admitting: Rehabilitation

## 2019-05-29 DIAGNOSIS — R2689 Other abnormalities of gait and mobility: Secondary | ICD-10-CM

## 2019-05-29 DIAGNOSIS — I469 Cardiac arrest, cause unspecified: Secondary | ICD-10-CM | POA: Diagnosis present

## 2019-05-29 DIAGNOSIS — I890 Lymphedema, not elsewhere classified: Secondary | ICD-10-CM | POA: Insufficient documentation

## 2019-05-29 NOTE — Therapy (Signed)
[place], [place], [address] Phone: [phone]   Fax:  [phone]  Physical Therapy Treatment  Patient Details  Name: Lisa [name] MRN: [id number] Date of Birth: February 04, 1941 Referring Provider (PT): Lattie Haw Cassidy-Vu   Encounter Date: 05/29/2019  PT End of Session - 05/29/19 1411    Visit Number  3    Number of Visits  10    Date for PT Re-Evaluation  06/07/19    PT Start Time  [id number]    PT Stop Time  1358    PT Time Calculation (min)  53 min    Activity Tolerance  Patient tolerated treatment well    Behavior During Therapy  [facility] for tasks assessed/performed       Past Medical History:  Diagnosis Date  . Cardiac arrest ([place]) 09/2015  . CHF (congestive heart failure) ([place])   . Complication of anesthesia   . Hx: UTI (urinary tract infection)   . Hypertension   . Melanoma ([place])   . PONV (postoperative nausea and vomiting)   . PVC (premature ventricular contraction)    HISTORY OF  . Thyroid disease     Past Surgical History:  Procedure Laterality Date  . ABDOMINAL HYSTERECTOMY    . APPENDECTOMY    . CARDIAC CATHETERIZATION N/A 10/03/2015   Procedure: Left Heart Cath and Coronary Angiography;  Surgeon: [name], MD;  Location: [place] [facility];  Service: Cardiovascular;  Laterality: N/A;  . CARDIAC CATHETERIZATION N/A 10/07/2015   Procedure: Coronary Stent Intervention;  Surgeon: [name], MD;  Location: [place] [facility];  Service: Cardiovascular;  Laterality: N/A;  . EP IMPLANTABLE DEVICE N/A 01/20/2016   Procedure: BiV ICD Insertion CRT-D;  Surgeon: [name], MD;  Location: [place] [facility];  Service: Cardiovascular;  Laterality: N/A;  . TONSILLECTOMY    . TONSILLECTOMY    . TUBAL LIGATION      There were no vitals filed for this visit.  Subjective Assessment - 05/29/19 1309    Subjective  overall feeling fine.  I have not been here in 2 weeks.  I think  my weeping place is maybe better.    Pertinent History  BLE lymphedema, CHF, chornic mylocytic leukemia    Currently in Pain?  Yes    Pain Score  1     Pain Location  Leg    Pain Orientation  Right    Pain Descriptors / Indicators  Aching    Pain Type  Chronic pain    Pain Onset  More than a month ago    Pain Frequency  Constant            LYMPHEDEMA/ONCOLOGY QUESTIONNAIRE - 05/29/19 1312      Right Lower Extremity Lymphedema   20 cm Proximal to Suprapatella  72.5 cm    10 cm Proximal to Suprapatella  69.1 cm    At Midpatella/Popliteal Crease  58.6 cm    30 cm Proximal to Floor at Lateral Plantar Foot  69.7 cm    20 cm Proximal to Floor at Lateral Plantar Foot  68.3 1    10  cm Proximal to Floor at Lateral Malleoli  54 cm    5 cm Proximal to 1st MTP Joint  24.3 cm    Across MTP Joint  24 cm    Around Proximal Great Toe  9.7 cm           Outpatient Re[facility] from [facility]/2019 [facility]  Lymphedema Life Impact Scale Total Score  38.24 %           [facility] Adult PT Treatment/Exercise - 05/29/19 0001      Manual Therapy   Manual Therapy  Compression Bandaging    Manual therapy comments  Reminded pt on how to remove bandages and to keep monitoring symptoms of CHF at home.    Manual Lymphatic Drainage (MLD)  Rt Axillary/inguinal lymph nodes were stimulated. 5x deep breathes, inguino-axillary anastomosis was stimulated followed by full anterior thigh, posterior knee and anteiror lower leg/dorsum of the foot . Rt only due to time    Compression Bandaging  in supine lotion applied, thick tg soft large from foot to thigh, mollelast on toes 1-4, abd padding in the hollow shin region due to melanoma removal, then artiflex x 3 from foot to ankle.  1 8cm roman sandle, 2 10cm to the lower leg, then 1 10cm to the thigh.  Applied one more 10cm in standing and helped pt donn farrow on the left which is overall too small.                 PT  Short Term Goals - 05/03/19 1512      PT SHORT TERM GOAL #1   Title  Patient will have reduction of limb girth by 3 cm within 3 weeks    Baseline  see measurements    Time  3    Period  Weeks    Status  New    Target Date  05/31/19      PT SHORT TERM GOAL #2   Title  Patient/caregiver independent in prevention/self-care management principles including self-massage/bandaging and long term management plan for edema.    Baseline  Pt is currently not doing anything for her lymphedema.    Time  3    Period  Weeks    Status  New    Target Date  05/31/19        PT Long Term Goals - 05/29/19 1415      PT LONG TERM GOAL #1   Title  Patient to be properly fitted with compression garment to wear on daily basis.    Status  On-going      PT LONG TERM GOAL #2   Title  Pt will be able to ambulate for 10 minutes or more continuously in order to decrease risk for immobility related to lymphedema and improve fluid flow of the BLE.    Status  On-going            Plan - 05/29/19 1412    Clinical Impression Statement  Pt returns after 2 week break due to work.  remeasured the right LE with the lower leg slightly larger but also more difficult to measure due to shape of the leg.  Overall pt reported that the bandaging was fine and no increased symptoms.  Continued with lighter than normal compression due to CHF but if okay again may be fine for normal levels.MLD only the the Rt LE due to time spent measuring the LE.    PT Frequency  3x / week    PT Duration  4 weeks    PT Treatment/Interventions  ADLs/Self Care Home Management;Electrical Stimulation;Gait training;Functional mobility training;Therapeutic activities;Therapeutic exercise;Neuromuscular re-education;Patient/family education;Manual lymph drainage;Manual techniques;Vasopneumatic Device    PT Next Visit Plan  circaid or something similar for the left? or bilateral compression? continue right CDT until measurements plateau for garments  Consulted and Agree with Plan of Care  Patient       Patient will benefit from skilled therapeutic intervention in order to improve the following deficits and impairments:     Visit Diagnosis: Lymphedema  Other abnormalities of gait and mobility  Cardiac arrest [facility])     Problem List Patient Active Problem List   Diagnosis Date Noted  . Acute bronchitis 11/02/2016  . Unstable angina ([place]) 10/07/2015  . Chronic systolic heart failure ([place])   . PVC's (premature ventricular contractions)   . Ventricular fibrillation ([place]) 10/03/2015  . Cardiac arrest ([place]) 10/03/2015  . Essential hypertension 09/09/2015  . Lymphedema 02/21/2015  . Other specified hypothyroidism 02/21/2015    Stark Bray 05/29/2019, 2:15 PM  [place], [place], [address] Phone: [phone]   Fax:  [phone]  Name: Lisa [name] MRN: [id number] Date of Birth: 1940/12/29

## 2019-05-31 ENCOUNTER — Ambulatory Visit: Payer: Medicare Other

## 2019-05-31 ENCOUNTER — Other Ambulatory Visit: Payer: Self-pay

## 2019-05-31 DIAGNOSIS — R2689 Other abnormalities of gait and mobility: Secondary | ICD-10-CM

## 2019-05-31 DIAGNOSIS — I469 Cardiac arrest, cause unspecified: Secondary | ICD-10-CM

## 2019-05-31 DIAGNOSIS — I890 Lymphedema, not elsewhere classified: Secondary | ICD-10-CM

## 2019-05-31 NOTE — Therapy (Addendum)
[place], [place], [address] Phone: [phone]   Fax:  [phone]  Physical Therapy Treatment  Patient Details  Name: Lisa [name] MRN: [id number] Date of Birth: Jun 08, 1941 Referring Provider (PT): Lattie Haw Cassidy-Vu   Encounter Date: 05/31/2019  PT End of Session - 05/31/19 1204    Visit Number  4    Number of Visits  10    Date for PT Re-Evaluation  06/07/19    PT Start Time  1110    PT Stop Time  1155    PT Time Calculation (min)  45 min    Activity Tolerance  Patient tolerated treatment well    Behavior During Therapy  [facility] for tasks assessed/performed       Past Medical History:  Diagnosis Date  . Cardiac arrest ([place]) 09/2015  . CHF (congestive heart failure) ([place])   . Complication of anesthesia   . Hx: UTI (urinary tract infection)   . Hypertension   . Melanoma ([place])   . PONV (postoperative nausea and vomiting)   . PVC (premature ventricular contraction)    HISTORY OF  . Thyroid disease     Past Surgical History:  Procedure Laterality Date  . ABDOMINAL HYSTERECTOMY    . APPENDECTOMY    . CARDIAC CATHETERIZATION N/A 10/03/2015   Procedure: Left Heart Cath and Coronary Angiography;  Surgeon: [name], MD;  Location: [place] [facility];  Service: Cardiovascular;  Laterality: N/A;  . CARDIAC CATHETERIZATION N/A 10/07/2015   Procedure: Coronary Stent Intervention;  Surgeon: [name], MD;  Location: [place] [facility];  Service: Cardiovascular;  Laterality: N/A;  . EP IMPLANTABLE DEVICE N/A 01/20/2016   Procedure: BiV ICD Insertion CRT-D;  Surgeon: [name], MD;  Location: [place] [facility];  Service: Cardiovascular;  Laterality: N/A;  . TONSILLECTOMY    . TONSILLECTOMY    . TUBAL LIGATION      There were no vitals filed for this visit.  Subjective Assessment - 05/31/19 1201    Subjective  Pt reports that her RLE has not been weeping she states that she  is doing her exercises at home. She states that she took her wrap off this morning.    Pertinent History  BLE lymphedema, CHF, chornic mylocytic leukemia    Currently in Pain?  Yes    Pain Location  Leg    Pain Orientation  Right    Pain Descriptors / Indicators  Aching    Pain Type  Chronic pain    Pain Onset  More than a month ago    Pain Frequency  Constant    Multiple Pain Sites  No                  Outpatient [facility]  Lymphedema Life Impact Scale Total Score  38.24 %           [facility] Adult PT Treatment/Exercise - 05/31/19 0001      Manual Therapy   Manual Therapy  Compression Bandaging;Manual Lymphatic Drainage (MLD)    Manual therapy comments  Reminded pt on how to remove bandages and to keep monitoring symptoms of CHF at home.    Manual Lymphatic Drainage (MLD)  Rt Axillary/inguinal lymph nodes were stimulated. 5x deep breathes, inguino-axillary anastomosis was stimulated followed by full anterior thigh, posterior knee and anteiror lower leg/dorsum of the foot . Rt only due to time    Compression Bandaging  in supine lotion applied, thick tg soft large from foot to thigh, mollelast on toes 1-4, abd padding in the hollow shin region due to melanoma removal, then artiflex x 3 from foot to ankle.  1 8cm roman sandle, 2 10cm to the lower leg, then 1 10cm to the thigh.  Applied one more 10cm in standing and helped pt donn farrow on the left which is overall too small but fits due to break down of elasticity.              PT Education - 05/31/19 1202    Education Details  Re-iterated education on when to remove the compression bandges and to immediately contact MD if she notices increased shortness of breathe, wheezing, crackling, fluid build up elsewhere or fatigue.    Person(s) Educated  Patient    Methods  Explanation    Comprehension  Verbalized understanding       PT Short Term Goals - 05/03/19  1512      PT SHORT TERM GOAL #1   Title  Patient will have reduction of limb girth by 3 cm within 3 weeks    Baseline  see measurements    Time  3    Period  Weeks    Status  New    Target Date  05/31/19      PT SHORT TERM GOAL #2   Title  Patient/caregiver independent in prevention/self-care management principles including self-massage/bandaging and long term management plan for edema.    Baseline  Pt is currently not doing anything for her lymphedema.    Time  3    Period  Weeks    Status  New    Target Date  05/31/19        PT Long Term Goals - 05/29/19 1415      PT LONG TERM GOAL #1   Title  Patient to be properly fitted with compression garment to wear on daily basis.    Status  On-going      PT LONG TERM GOAL #2   Title  Pt will be able to ambulate for 10 minutes or more continuously in order to decrease risk for immobility related to lymphedema and improve fluid flow of the BLE.    Status  On-going            Plan - 05/31/19 1204    Clinical Impression Statement  Pt returned after getting wrapped 2 days ago. No longer is there any weeping present on the posterior R lower leg. There is a scabbed area. She has been performing her exercises at home. Continue with slightly tighter compression this session after MLD for the anterior trunk and RLE due to CHF diagnosis. Pt will benefit from continued POC.    Personal Factors and Comorbidities  Comorbidity 3+    Comorbidities  Leukemia, CHF, Lymphedema    Stability/Clinical Decision Making  Stable/Uncomplicated    Rehab Potential  Good    PT Frequency  3x / week    PT Duration  4 weeks    PT Treatment/Interventions  ADLs/Self Care Home Management;Electrical Stimulation;Gait training;Functional mobility training;Therapeutic activities;Therapeutic exercise;Neuromuscular re-education;Patient/family education;Manual lymph drainage;Manual techniques;Vasopneumatic Device    PT Next Visit Plan  Do not wrap the LLE at this time  due to CHF diagnosis.    Consulted and Agree with Plan of Care  Patient       Patient will benefit from skilled therapeutic intervention in order to improve the following deficits and impairments:  Difficulty  walking, Decreased endurance, Decreased activity tolerance, Increased edema, Decreased mobility  Visit Diagnosis: Lymphedema  Other abnormalities of gait and mobility  Cardiac arrest [facility])     Problem List Patient Active Problem List   Diagnosis Date Noted  . Acute bronchitis 11/02/2016  . Unstable angina ([place]) 10/07/2015  . Chronic systolic heart failure ([place])   . PVC's (premature ventricular contractions)   . Ventricular fibrillation ([place]) 10/03/2015  . Cardiac arrest ([place]) 10/03/2015  . Essential hypertension 09/09/2015  . Lymphedema 02/21/2015  . Other specified hypothyroidism 02/21/2015    Ander Purpura, PT 05/31/2019, 12:09 PM  [place], [place], [address] Phone: [phone]   Fax:  [phone]  Name: Lisa [name] MRN: [id number] Date of Birth: 11-Sep-1940  PHYSICAL THERAPY DISCHARGE SUMMARY  Visits from Start of Care: 4  Current functional level related to goals / functional outcomes: Pt did not return after last visit   Remaining deficits: Chronic lymphedema   Education / Equipment: intial HEP  Plan: Patient agrees to discharge.  Patient goals were not met. Patient is being discharged due to not returning since the last visit.  ?????    Shan Levans, PT

## 2019-06-06 ENCOUNTER — Ambulatory Visit: Payer: Medicare Other

## 2019-06-14 ENCOUNTER — Other Ambulatory Visit: Payer: Self-pay

## 2019-06-14 ENCOUNTER — Encounter: Payer: Self-pay | Admitting: Internal Medicine

## 2019-06-14 ENCOUNTER — Ambulatory Visit (INDEPENDENT_AMBULATORY_CARE_PROVIDER_SITE_OTHER): Payer: Medicare Other | Admitting: Internal Medicine

## 2019-06-14 VITALS — BP 114/60 | HR 85 | Ht 61.0 in | Wt 265.3 lb

## 2019-06-14 DIAGNOSIS — I2510 Atherosclerotic heart disease of native coronary artery without angina pectoris: Secondary | ICD-10-CM | POA: Diagnosis not present

## 2019-06-14 DIAGNOSIS — Z95810 Presence of automatic (implantable) cardiac defibrillator: Secondary | ICD-10-CM | POA: Diagnosis not present

## 2019-06-14 DIAGNOSIS — I5022 Chronic systolic (congestive) heart failure: Secondary | ICD-10-CM

## 2019-06-14 DIAGNOSIS — I4901 Ventricular fibrillation: Secondary | ICD-10-CM | POA: Diagnosis not present

## 2019-06-14 NOTE — Patient Instructions (Addendum)
Medication Instructions:  Your physician recommends that you continue on your current medications as directed. Please refer to the Current Medication list given to you today.  Labwork: None ordered.  Testing/Procedures: None ordered.  Follow-Up: Your physician wants you to follow-up in: 9 months with Dr. Lovena Le.   You will receive a reminder letter in the mail two months in advance. If you don't receive a letter, please call our office to schedule the follow-up appointment.  Remote monitoring is used to monitor your ICD from home. This monitoring reduces the number of office visits required to check your device to one time per year. It allows [place] to keep an eye on the functioning of your device to ensure it is working properly. You are scheduled for a device check from home on 08/01/2019. You may send your transmission at any time that day. If you have a wireless device, the transmission will be sent automatically. After your physician reviews your transmission, you will receive a postcard with your next transmission date.  Any Other Special Instructions Will Be Listed Below (If Applicable).  If you need a refill on your cardiac medications before your next appointment, please call your pharmacy.

## 2019-06-14 NOTE — Progress Notes (Signed)
HPI Lisa [name] returns today for followup. She is a pleasant 78 yo woman with a h/o chronic systolic heart failure, , s/p Biv ICD insertion. She has been in the hospital, first with cellulitis and then with CDiff. She is improving. She has had some sob and fluid overload. She denies chest pain. No ICD therapies.  Allergies  Allergen Reactions  . Flagyl [Metronidazole] Itching and Rash    Welts, itching  . Penicillins Hives, Itching and Rash    Has patient had a PCN reaction causing immediate rash, facial/tongue/throat swelling, SOB or lightheadedness with hypotension: Yes  Has patient had a PCN reaction causing severe rash involving mucus membranes or skin necrosis: Yes Has patient had a PCN reaction that required hospitalization No Has patient had a PCN reaction occurring within the last 10 years: NO If all of the above answers are "NO", then may proceed with Cephalosporin use.   . Sulfamethoxazole Rash  . Meperidine Nausea And Vomiting  . Morphine And Related Nausea And Vomiting  . Wheat Bran Other (See Comments)    Runny nose  . Doxycycline Rash    Made her "feel terrible"  . Lanolin Itching     Current Outpatient Medications  Medication Sig Dispense Refill  . acetaminophen (TYLENOL) 500 MG tablet Take 500-1,000 mg by mouth every 6 (six) hours as needed for moderate pain.    [name] allopurinol (ZYLOPRIM) 100 MG tablet Take 1 tablet by mouth daily.    [name] aspirin EC 81 MG tablet Take 81 mg by mouth daily.    [name] atorvastatin (LIPITOR) 20 MG tablet Take 1 tablet (20 mg total) by mouth daily. 90 tablet 3  . CALCIUM PO Take 1 tablet by mouth daily.    . carvedilol (COREG) 25 MG tablet Take 1 tablet (25 mg total) by mouth 2 (two) times daily with a meal. 180 tablet 3  . Cholecalciferol (VITAMIN D-3 PO) Take 5,000 Units by mouth daily.    . Coenzyme Q10 (COQ10 PO) Take 1 capsule by mouth daily.    . Cyanocobalamin (VITAMIN B-12 PO) Take 1 tablet by mouth daily.    . furosemide  (LASIX) 20 MG tablet Take 1 tablet by mouth daily.    [name] imatinib (GLEEVEC) 400 MG tablet Take 400 mg by mouth.    [name] lisinopril (PRINIVIL,ZESTRIL) 10 MG tablet Take 1 tablet (10 mg total) by mouth daily. 90 tablet 3  . MAGNESIUM PO Take 1 tablet by mouth daily.    . Multiple Vitamin (MULTIVITAMIN WITH MINERALS) TABS tablet Take 1 tablet by mouth daily.    . nitroGLYCERIN (NITROSTAT) 0.4 MG SL tablet Place 1 tablet (0.4 mg total) under the tongue every 5 (five) minutes x 3 doses as needed for chest pain. 25 tablet 3  . ondansetron (ZOFRAN-ODT) 4 MG disintegrating tablet Take 1 tablet by mouth as needed.    . potassium chloride (K-DUR) 10 MEQ tablet Take one tablet as directed only on days when EXTRA dose of furosemide is taken 30 tablet 1  . spironolactone (ALDACTONE) 25 MG tablet Take 1 tablet (25 mg total) by mouth daily. 90 tablet 3  . thyroid (ARMOUR) 15 MG tablet Take 15 mg by mouth daily.     No current facility-administered medications for this visit.      Past Medical History:  Diagnosis Date  . Cardiac arrest ([place]) 09/2015  . CHF (congestive heart failure) ([place])   . Complication of anesthesia   . Hx: UTI (urinary  tract infection)   . Hypertension   . Melanoma ([place])   . PONV (postoperative nausea and vomiting)   . PVC (premature ventricular contraction)    HISTORY OF  . Thyroid disease     ROS:   All systems reviewed and negative except as noted in the HPI.   Past Surgical History:  Procedure Laterality Date  . ABDOMINAL HYSTERECTOMY    . APPENDECTOMY    . CARDIAC CATHETERIZATION N/A 10/03/2015   Procedure: Left Heart Cath and Coronary Angiography;  Surgeon: [name], MD;  Location: [facility];  Service: Cardiovascular;  Laterality: N/A;  . CARDIAC CATHETERIZATION N/A 10/07/2015   Procedure: Coronary Stent Intervention;  Surgeon: [name], MD;  Location: [facility];  Service: Cardiovascular;  Laterality: N/A;  . EP IMPLANTABLE DEVICE  N/A 01/20/2016   Procedure: BiV ICD Insertion CRT-D;  Surgeon: [name], MD;  Location: [facility];  Service: Cardiovascular;  Laterality: N/A;  . TONSILLECTOMY    . TONSILLECTOMY    . TUBAL LIGATION       Family History  Problem Relation Age of Onset  . Coronary artery disease Mother   . Thyroid disease Mother   . Multiple myeloma Father   . Heart attack Maternal Grandfather   . Heart attack Paternal Grandfather      Social History   Socioeconomic History  . Marital status: Divorced    Spouse name: Not on file  . Number of children: Not on file  . Years of education: Not on file  . Highest education level: Not on file  Occupational History  . Not on file  Social Needs  . Financial resource strain: Not on file  . Food insecurity    Worry: Not on file    Inability: Not on file  . Transportation needs    Medical: Not on file    Non-medical: Not on file  Tobacco Use  . Smoking status: Never Smoker  . Smokeless tobacco: Never Used  Substance and Sexual Activity  . Alcohol use: No    Alcohol/week: 0.0 standard drinks  . Drug use: No  . Sexual activity: Not on file  Lifestyle  . Physical activity    Days per week: Not on file    Minutes per session: Not on file  . Stress: Not on file  Relationships  . Social Herbalist on phone: Not on file    Gets together: Not on file    Attends religious service: Not on file    Active member of club or organization: Not on file    Attends meetings of clubs or organizations: Not on file    Relationship status: Not on file  . Intimate partner violence    Fear of current or ex partner: Not on file    Emotionally abused: Not on file    Physically abused: Not on file    Forced sexual activity: Not on file  Other Topics Concern  . Not on file  Social History Narrative  . Not on file     BP 114/60   Pulse 85   Ht '5\' 1"'  (1.549 m)   Wt 265 lb 4.8 oz (120.3 kg)   LMP 02/14/1995   SpO2 98%   BMI  50.13 kg/m   Physical Exam:  Well appearing NAD HEENT: Unremarkable Neck:  No JVD, no thyromegally Lymphatics:  No adenopathy Back:  No CVA tenderness Lungs:  Clear with no  wheezes HEART:  Regular rate rhythm, no murmurs, no rubs, no clicks Abd:  soft, positive bowel sounds, no organomegally, no rebound, no guarding Ext:  2 plus pulses, chronic lymphedema, no cyanosis, no clubbing Skin:  No rashes no nodules Neuro:  CN II through XII intact, motor grossly intact   DEVICE  Normal device function.  See PaceArt for details.   Assess/Plan: 1. Chronic systolic heart failure - her symptoms are class 2-3. She will continue her current meds. I encouraged her to reduce her salt intake. 2. ICD - her medtronic Biv ICD is working normally. We will recheck in severalmonths. 3. NSVT - her symptoms are well compensated. No change.   Mikle Bosworth.D.

## 2019-06-15 ENCOUNTER — Telehealth: Payer: Self-pay | Admitting: *Deleted

## 2019-06-15 NOTE — Telephone Encounter (Signed)
I spoke with pt to let her know upcoming visit on 11/9 with Dr Harrington Challenger would be a virtual appointment.  She requests an in person visit with Dr Harrington Challenger.  Does not want to see APP.  I scheduled pt to see Dr Harrington Challenger on December 11,2020 at 10:20.

## 2019-06-19 ENCOUNTER — Ambulatory Visit: Payer: Medicare Other | Admitting: Internal Medicine

## 2019-07-05 ENCOUNTER — Encounter: Payer: Medicare Other | Admitting: Internal Medicine

## 2019-07-20 NOTE — Progress Notes (Signed)
Cardiology Office Note   Date:  07/21/2019   ID:  Lisa [name], DOB [birth year], MRN [id number]  PCP:  [name], MD  Cardiologist:   [name], MD   F/U of CHF      History of Present Illness: Lisa [name] is a 78 y.o. female with a history of morbid obesity, HTN, HL, hypothroidism, LBBB, chronic lymphedema, systolic CHF (LVEF 25 to 78%) due to mixed NICM/ICM, VFib arrest   and CAD    She was last seen by [name] in Nov 2018   She is also followed by [name] (s/P ICD palacement in 2017)  Not lisinopril stopped in past due to hyperkalemia   Echo in November 2018, LVEF had normalized  I last saw the pt in 2019   The pt says she was hospitalized at [facility] earlier this fall for Cellulits and then C Difficile infection   While there she was taken off of lisinopril and spironolactone    The pt still gets some tightness in chest    Does not wake her from sleep   Current Meds  Medication Sig  . acetaminophen (TYLENOL) 500 MG tablet Take 500-1,000 mg by mouth every 6 (six) hours as needed for moderate pain.  [name] allopurinol (ZYLOPRIM) 100 MG tablet Take 1 tablet by mouth daily.  [name] aspirin EC 81 MG tablet Take 81 mg by mouth daily.  [name] atorvastatin (LIPITOR) 20 MG tablet Take 1 tablet (20 mg total) by mouth daily.  [name] CALCIUM PO Take 1 tablet by mouth daily.  . carvedilol (COREG) 25 MG tablet Take 1 tablet (25 mg total) by mouth 2 (two) times daily with a meal.  . Cholecalciferol (VITAMIN D-3 PO) Take 5,000 Units by mouth daily.  . Coenzyme Q10 (COQ10 PO) Take 1 capsule by mouth as needed.   . Cyanocobalamin (VITAMIN B-12 PO) Take 1 tablet by mouth daily.  . furosemide (LASIX) 20 MG tablet Take 1 tablet by mouth daily.  [name] imatinib (GLEEVEC) 400 MG tablet Take 400 mg by mouth.  [name] MAGNESIUM PO Take 1 tablet by mouth daily.  . Multiple Vitamin (MULTIVITAMIN WITH MINERALS) TABS tablet Take 1 tablet by mouth daily.  . nitroGLYCERIN (NITROSTAT) 0.4 MG SL tablet Place 1  tablet (0.4 mg total) under the tongue every 5 (five) minutes x 3 doses as needed for chest pain.  [name] ondansetron (ZOFRAN-ODT) 4 MG disintegrating tablet Take 1 tablet by mouth as needed.  . potassium chloride (K-DUR) 10 MEQ tablet Take one tablet as directed only on days when EXTRA dose of furosemide is taken  . thyroid (ARMOUR) 15 MG tablet Take 15 mg by mouth daily.     Allergies:   Flagyl [metronidazole], Penicillins, Sulfamethoxazole, Meperidine, Morphine and related, Sulfa antibiotics, Wheat bran, Doxycycline, and Lanolin   Past Medical History:  Diagnosis Date  . Cardiac arrest ([place]) 09/2015  . CHF (congestive heart failure) ([place])   . Complication of anesthesia   . Hx: UTI (urinary tract infection)   . Hypertension   . Melanoma ([place])   . PONV (postoperative nausea and vomiting)   . PVC (premature ventricular contraction)    HISTORY OF  . Thyroid disease     Past Surgical History:  Procedure Laterality Date  . ABDOMINAL HYSTERECTOMY    . APPENDECTOMY    . CARDIAC CATHETERIZATION N/A 10/03/2015   Procedure: Left Heart Cath and Coronary Angiography;  Surgeon: [name], MD;  Location: [facility];  Service:  Cardiovascular;  Laterality: N/A;  . CARDIAC CATHETERIZATION N/A 10/07/2015   Procedure: Coronary Stent Intervention;  Surgeon: [name], MD;  Location: [facility];  Service: Cardiovascular;  Laterality: N/A;  . EP IMPLANTABLE DEVICE N/A 01/20/2016   Procedure: BiV ICD Insertion CRT-D;  Surgeon: [name], MD;  Location: [facility];  Service: Cardiovascular;  Laterality: N/A;  . TONSILLECTOMY    . TONSILLECTOMY    . TUBAL LIGATION       Social History:  The patient  reports that she has never smoked. She has never used smokeless tobacco. She reports that she does not drink alcohol or use drugs.   Family History:  The patient's family history includes Coronary artery disease in her mother; Heart attack in her maternal  grandfather and paternal grandfather; Multiple myeloma in her father; Thyroid disease in her mother.    ROS:  Please see the history of present illness. All other systems are reviewed and  Negative to the above problem except as noted.    PHYSICAL EXAM: VS:  BP (!) 124/58   Pulse 77   Ht 5' 1.5" (1.562 m)   Wt 260 lb 12.8 oz (118.3 kg)   LMP 02/14/1995   SpO2 98%   BMI 48.48 kg/m   GEN: Morbidly obese 78 yo in no acute distress  HEENT: normal  Neck: JVP does not appear elevated    \Cardiac: RRR; no murmurs, rubs, or gallops,chornic   Chronic lymphedema of legs  Respiratory:  clear to auscultation bilaterally, normal work of breathing GI: soft, nontender, nondistended, + BS  No hepatomegaly   Skin: warm and dry, no rash Neuro:  Strength and sensation are intact Psych: euthymic mood, full affect   EKG:  EKG is not ordered today.   Lipid Panel    Component Value Date/Time   CHOL 105 10/04/2015 0329   TRIG 91 10/04/2015 0329   HDL 25 (L) 10/04/2015 0329   CHOLHDL 4.2 10/04/2015 0329   VLDL 18 10/04/2015 0329   LDLCALC 62 10/04/2015 0329      Wt Readings from Last 3 Encounters:  07/21/19 260 lb 12.8 oz (118.3 kg)  06/14/19 265 lb 4.8 oz (120.3 kg)  08/11/18 255 lb 3.2 oz (115.8 kg)      ASSESSMENT AND PLAN:  1  Hx chronic systoiic CHF   Pt now s/p CRT D  Echo in September shows LVEf is still normal    She has been taken off of lisinopril and aldactone.   I would recomm resuming lisinoopril before stress test    Cut coreg to 12.5 bid and add lisinopril 5 mg   Check BMET in 10 days from starting    2   CAD  80% LAD stensosi with PCI in 2017   Concerning with intermitt chest pressure   Iwould set up for a lexiscan myovue to r/o inducible ischemia    3  Hx VF arrest   S/p Medtronid CRT-D  4  Lymphedema   Chronic   Elevate egs    5  HTN  BP is OK   Regimen changes as noted    [name]  Current medicines are reviewed at length with the patient today.  The patient  does not have concerns regarding medicines.  Signed, [name], MD  07/21/2019 11:03 AM    [address], [place], [place]  [address] Phone: [phone]; Fax: [phone]

## 2019-07-21 ENCOUNTER — Other Ambulatory Visit: Payer: Self-pay

## 2019-07-21 ENCOUNTER — Encounter: Payer: Self-pay | Admitting: *Deleted

## 2019-07-21 ENCOUNTER — Ambulatory Visit (INDEPENDENT_AMBULATORY_CARE_PROVIDER_SITE_OTHER): Payer: Medicare Other | Admitting: Internal Medicine

## 2019-07-21 ENCOUNTER — Encounter: Payer: Self-pay | Admitting: Internal Medicine

## 2019-07-21 VITALS — BP 124/58 | HR 77 | Ht 61.5 in | Wt 260.8 lb

## 2019-07-21 DIAGNOSIS — I2510 Atherosclerotic heart disease of native coronary artery without angina pectoris: Secondary | ICD-10-CM

## 2019-07-21 DIAGNOSIS — R0609 Other forms of dyspnea: Secondary | ICD-10-CM

## 2019-07-21 DIAGNOSIS — R0600 Dyspnea, unspecified: Secondary | ICD-10-CM | POA: Diagnosis not present

## 2019-07-21 MED ORDER — LISINOPRIL 5 MG PO TABS: 5.0000 mg | ORAL_TABLET | Freq: Every day | ORAL | 3 refills | Status: DC

## 2019-07-21 MED ORDER — CARVEDILOL 12.5 MG PO TABS: 12.5000 mg | ORAL_TABLET | Freq: Two times a day (BID) | ORAL | 3 refills | Status: DC

## 2019-07-21 NOTE — Patient Instructions (Addendum)
Medication Instructions:  Your physician has recommended you make the following change in your medication:   10 days prior to your stress test start: 1.) lisinopril 5 mg daily 2.) carvedilol 12.5 mg twice daily  *If you need a refill on your cardiac medications before your next appointment, please call your pharmacy*  Lab Work: On the day you come in for the stress test --BMET  If you have labs (blood work) drawn today and your tests are completely normal, you will receive your results only by: [name] MyChart Message (if you have MyChart) OR . A paper copy in the mail If you have any lab test that is abnormal or we need to change your treatment, we will call you to review the results.  Testing/Procedures: Your physician has requested that you have a lexiscan myoview. For further information please visit [email or web address]. Please follow instruction sheet, as given.    Follow-Up: At [facility], you and your health needs are our priority.  As part of our continuing mission to provide you with exceptional heart care, we have created designated Provider Care Teams.  These Care Teams include your primary Cardiologist (physician) and Advanced Practice Providers (APPs -  Physician Assistants and Nurse Practitioners) who all work together to provide you with the care you need, when you need it.  Your next appointment:   4 month(s)  The format for your next appointment:   In Person  Provider:   You may see [name], MD or one of the following Advanced Practice Providers on your designated Care Team:    [name], PA-C  [place], [place]  Daune Perch, NP   Other Instructions

## 2019-08-01 ENCOUNTER — Ambulatory Visit (INDEPENDENT_AMBULATORY_CARE_PROVIDER_SITE_OTHER): Payer: Medicare Other | Admitting: *Deleted

## 2019-08-01 DIAGNOSIS — I5022 Chronic systolic (congestive) heart failure: Secondary | ICD-10-CM

## 2019-08-01 LAB — CUP PACEART REMOTE DEVICE CHECK
Battery Remaining Longevity: 58 mo
Battery Voltage: 2.98 V
Brady Statistic AP VP Percent: 0.08 %
Brady Statistic AP VS Percent: 0.02 %
Brady Statistic AS VP Percent: 98.11 %
Brady Statistic AS VS Percent: 1.78 %
Brady Statistic RA Percent Paced: 0.11 %
Brady Statistic RV Percent Paced: 12.11 %
Date Time Interrogation Session: 20201222043825
HighPow Impedance: 56 Ohm
Implantable Lead Implant Date: 20170612
Implantable Lead Implant Date: 20170612
Implantable Lead Implant Date: 20170612
Implantable Lead Location: 753858
Implantable Lead Location: 753859
Implantable Lead Location: 753860
Implantable Lead Model: 4398
Implantable Lead Model: 5076
Implantable Pulse Generator Implant Date: 20170612
Lead Channel Impedance Value: 142.5 Ohm
Lead Channel Impedance Value: 178.125
Lead Channel Impedance Value: 178.125
Lead Channel Impedance Value: 185.581
Lead Channel Impedance Value: 185.581
Lead Channel Impedance Value: 247 Ohm
Lead Channel Impedance Value: 285 Ohm
Lead Channel Impedance Value: 285 Ohm
Lead Channel Impedance Value: 304 Ohm
Lead Channel Impedance Value: 418 Ohm
Lead Channel Impedance Value: 475 Ohm
Lead Channel Impedance Value: 513 Ohm
Lead Channel Impedance Value: 532 Ohm
Lead Channel Impedance Value: 646 Ohm
Lead Channel Impedance Value: 665 Ohm
Lead Channel Impedance Value: 665 Ohm
Lead Channel Impedance Value: 703 Ohm
Lead Channel Impedance Value: 722 Ohm
Lead Channel Pacing Threshold Amplitude: 0.875 V
Lead Channel Pacing Threshold Amplitude: 0.875 V
Lead Channel Pacing Threshold Amplitude: 1.5 V
Lead Channel Pacing Threshold Pulse Width: 0.4 ms
Lead Channel Pacing Threshold Pulse Width: 0.4 ms
Lead Channel Pacing Threshold Pulse Width: 0.8 ms
Lead Channel Sensing Intrinsic Amplitude: 3.5 mV
Lead Channel Sensing Intrinsic Amplitude: 3.5 mV
Lead Channel Sensing Intrinsic Amplitude: 7.625 mV
Lead Channel Sensing Intrinsic Amplitude: 7.625 mV
Lead Channel Setting Pacing Amplitude: 1.75 V
Lead Channel Setting Pacing Amplitude: 2 V
Lead Channel Setting Pacing Amplitude: 2.5 V
Lead Channel Setting Pacing Pulse Width: 0.4 ms
Lead Channel Setting Pacing Pulse Width: 0.8 ms
Lead Channel Setting Sensing Sensitivity: 0.3 mV

## 2019-08-31 ENCOUNTER — Telehealth (HOSPITAL_COMMUNITY): Payer: Self-pay

## 2019-08-31 NOTE — Telephone Encounter (Signed)
Spoke with the patient, she stated that she would be here for her test and she understood the instructions. Asked to call back with any questions. S.Takari Lundahl EMTP

## 2019-09-01 ENCOUNTER — Telehealth (HOSPITAL_COMMUNITY): Payer: Self-pay | Admitting: *Deleted

## 2019-09-01 NOTE — Telephone Encounter (Signed)
Patient given detailed instructions per Myocardial Perfusion Study Information Sheet for the test on 09/04/19 at 10:00. Patient notified to arrive 15 minutes early and that it is imperative to arrive on time for appointment to keep from having the test rescheduled.  If you need to cancel or reschedule your appointment, please call the office within 24 hours of your appointment. . Patient verbalized understanding.Lisa [name]

## 2019-09-04 ENCOUNTER — Other Ambulatory Visit: Payer: Medicare Other

## 2019-09-04 ENCOUNTER — Ambulatory Visit (HOSPITAL_COMMUNITY): Payer: Medicare Other | Attending: Cardiology

## 2019-09-04 ENCOUNTER — Other Ambulatory Visit: Payer: Self-pay

## 2019-09-04 DIAGNOSIS — R0600 Dyspnea, unspecified: Secondary | ICD-10-CM | POA: Insufficient documentation

## 2019-09-04 DIAGNOSIS — R0609 Other forms of dyspnea: Secondary | ICD-10-CM

## 2019-09-04 DIAGNOSIS — I2510 Atherosclerotic heart disease of native coronary artery without angina pectoris: Secondary | ICD-10-CM | POA: Insufficient documentation

## 2019-09-04 MED ORDER — TECHNETIUM TC 99M TETROFOSMIN IV KIT
31.1000 | PACK | Freq: Once | INTRAVENOUS | Status: AC | PRN
  Administered 2019-09-04: 31.1 via INTRAVENOUS
  Filled 2019-09-04: qty 32

## 2019-09-04 MED ORDER — REGADENOSON 0.4 MG/5ML IV SOLN
0.4000 mg | Freq: Once | INTRAVENOUS | Status: AC
  Administered 2019-09-04: 0.4 mg via INTRAVENOUS

## 2019-09-05 ENCOUNTER — Ambulatory Visit (HOSPITAL_COMMUNITY): Payer: Medicare Other | Attending: Cardiovascular Disease

## 2019-09-05 ENCOUNTER — Telehealth: Payer: Self-pay | Admitting: *Deleted

## 2019-09-05 LAB — MYOCARDIAL PERFUSION IMAGING
LV dias vol: 117 mL (ref 46–106)
LV sys vol: 62 mL
Peak HR: 95 {beats}/min
Rest HR: 69 {beats}/min
SDS: 4
SRS: 0
SSS: 4
TID: 1.14

## 2019-09-05 MED ORDER — TECHNETIUM TC 99M TETROFOSMIN IV KIT
31.1000 | PACK | Freq: Once | INTRAVENOUS | Status: AC | PRN
  Administered 2019-09-05: 31.1 via INTRAVENOUS
  Filled 2019-09-05: qty 32

## 2019-09-05 NOTE — Telephone Encounter (Signed)
Pt has been notified of Myoview results by phone with verbal understanding. Pt states she would like to resume Coreg 25 mg BID as previously was taking. Pt states she felt better on Coreg 25 mg BID. I assured the pt that I will send my note to Brooke Dare RN for Dr. Harrington Challenger to d/w MD in regards to further recommendations. Advised the pt to follow the current Tx plan of lisinopril 5 mg daily and coreg 12.5 mg BID until we have further recommendations from  Dr. Harrington Challenger. Pt thanked me for the call and the help. Patient notified of result.  Please refer to phone note from today for complete details.   Julaine Hua, [facility] 09/05/2019 4:33 PM

## 2019-09-05 NOTE — Telephone Encounter (Signed)
-----   Message from Fay Records, MD sent at 09/05/2019  3:05 PM EST ----- Stress test is normal  No ischemia  No scar.  Pumping function normal

## 2019-09-05 NOTE — Telephone Encounter (Signed)
OK if she wants to go back to 25 bid

## 2019-09-06 MED ORDER — CARVEDILOL 25 MG PO TABS: 25.0000 mg | ORAL_TABLET | Freq: Two times a day (BID) | ORAL | 3 refills | Status: DC

## 2019-09-06 NOTE — Telephone Encounter (Signed)
Patient informed ok to go back up to carvedilol 25 mg bid.  Prior to being hospitalized she was taking the lisinopril 5 mg in also. I adv her to continue this unless she hears back from me.  Will route to Dr. Harrington Challenger to confirm ok to stay on lisinopril 5 mg daily. Pt has tolerated well in past but lisinopril was stopped at her hospitalizations in the fall. Has bp cuff but needs to get batteries  I adv to monitor bp couple times a week and if check if she would feel lightheaded or fatigued.  Pt agrees with this plan.

## 2019-09-07 NOTE — Telephone Encounter (Signed)
Agree, OK to use lisinopril 5 mg  Follow BP Call if low or if starts getting dizzy

## 2019-09-14 ENCOUNTER — Telehealth: Payer: Self-pay | Admitting: Internal Medicine

## 2019-09-14 NOTE — Telephone Encounter (Signed)
Patient with appropriate HV therapy last night for PMVT/VF.  Called patient to assess symptoms.  [place], NP 09/14/2019 12:35 PM

## 2019-09-14 NOTE — Telephone Encounter (Signed)
Spoke with patient. She was using the restroom last night around midnight, got very dizzy, and then was better. She is unsure if she passed out, but did not feel a shock.   Advised of [place] driving restrictions.   She is on Coreg 25mg  bid.  Will route to Dr Lovena Le as Dola Factor, NP 09/14/2019 1:06 PM

## 2019-09-21 ENCOUNTER — Encounter (HOSPITAL_COMMUNITY): Payer: Self-pay

## 2019-09-21 ENCOUNTER — Emergency Department (HOSPITAL_COMMUNITY): Payer: Medicare Other

## 2019-09-21 ENCOUNTER — Other Ambulatory Visit: Payer: Medicare Other | Admitting: *Deleted

## 2019-09-21 ENCOUNTER — Ambulatory Visit (INDEPENDENT_AMBULATORY_CARE_PROVIDER_SITE_OTHER): Payer: Medicare Other | Admitting: *Deleted

## 2019-09-21 ENCOUNTER — Telehealth: Payer: Self-pay

## 2019-09-21 ENCOUNTER — Other Ambulatory Visit: Payer: Self-pay

## 2019-09-21 ENCOUNTER — Inpatient Hospital Stay (HOSPITAL_COMMUNITY)
Admission: EM | Admit: 2019-09-21 | Discharge: 2019-09-23 | DRG: 287 | Disposition: A | Discharge status: Home / Self Care | Payer: Medicare Other | Attending: Interventional Cardiology | Admitting: Interventional Cardiology

## 2019-09-21 DIAGNOSIS — Z85820 Personal history of malignant melanoma of skin: Secondary | ICD-10-CM | POA: Diagnosis not present

## 2019-09-21 DIAGNOSIS — I890 Lymphedema, not elsewhere classified: Secondary | ICD-10-CM | POA: Diagnosis present

## 2019-09-21 DIAGNOSIS — Z79899 Other long term (current) drug therapy: Secondary | ICD-10-CM

## 2019-09-21 DIAGNOSIS — Z955 Presence of coronary angioplasty implant and graft: Secondary | ICD-10-CM

## 2019-09-21 DIAGNOSIS — Z7982 Long term (current) use of aspirin: Secondary | ICD-10-CM

## 2019-09-21 DIAGNOSIS — I428 Other cardiomyopathies: Secondary | ICD-10-CM | POA: Diagnosis present

## 2019-09-21 DIAGNOSIS — Z6841 Body Mass Index (BMI) 40.0 and over, adult: Secondary | ICD-10-CM | POA: Diagnosis not present

## 2019-09-21 DIAGNOSIS — Z4502 Encounter for adjustment and management of automatic implantable cardiac defibrillator: Secondary | ICD-10-CM

## 2019-09-21 DIAGNOSIS — C9210 Chronic myeloid leukemia, BCR/ABL-positive, not having achieved remission: Secondary | ICD-10-CM | POA: Diagnosis present

## 2019-09-21 DIAGNOSIS — I472 Ventricular tachycardia, unspecified: Secondary | ICD-10-CM

## 2019-09-21 DIAGNOSIS — Z95810 Presence of automatic (implantable) cardiac defibrillator: Secondary | ICD-10-CM | POA: Diagnosis not present

## 2019-09-21 DIAGNOSIS — I4729 Other ventricular tachycardia: Secondary | ICD-10-CM

## 2019-09-21 DIAGNOSIS — E039 Hypothyroidism, unspecified: Secondary | ICD-10-CM | POA: Diagnosis present

## 2019-09-21 DIAGNOSIS — I4901 Ventricular fibrillation: Secondary | ICD-10-CM

## 2019-09-21 DIAGNOSIS — E785 Hyperlipidemia, unspecified: Secondary | ICD-10-CM | POA: Diagnosis present

## 2019-09-21 DIAGNOSIS — Z20822 Contact with and (suspected) exposure to covid-19: Secondary | ICD-10-CM | POA: Diagnosis present

## 2019-09-21 DIAGNOSIS — I110 Hypertensive heart disease with heart failure: Secondary | ICD-10-CM | POA: Diagnosis present

## 2019-09-21 DIAGNOSIS — I252 Old myocardial infarction: Secondary | ICD-10-CM

## 2019-09-21 DIAGNOSIS — I5023 Acute on chronic systolic (congestive) heart failure: Secondary | ICD-10-CM | POA: Diagnosis not present

## 2019-09-21 DIAGNOSIS — I5022 Chronic systolic (congestive) heart failure: Secondary | ICD-10-CM | POA: Diagnosis present

## 2019-09-21 DIAGNOSIS — I2510 Atherosclerotic heart disease of native coronary artery without angina pectoris: Secondary | ICD-10-CM | POA: Diagnosis present

## 2019-09-21 DIAGNOSIS — Z90710 Acquired absence of both cervix and uterus: Secondary | ICD-10-CM | POA: Diagnosis not present

## 2019-09-21 DIAGNOSIS — E669 Obesity, unspecified: Secondary | ICD-10-CM | POA: Diagnosis present

## 2019-09-21 HISTORY — DX: Chronic myeloid leukemia, BCR/ABL-positive, not having achieved remission: C92.10

## 2019-09-21 HISTORY — DX: Ventricular tachycardia: I47.2

## 2019-09-21 HISTORY — DX: Ventricular tachycardia, unspecified: I47.20

## 2019-09-21 LAB — COMPREHENSIVE METABOLIC PANEL
ALT: UNDETERMINED U/L (ref 0–44)
AST: 28 U/L (ref 15–41)
Albumin: 3.6 g/dL (ref 3.5–5.0)
Alkaline Phosphatase: 66 U/L (ref 38–126)
Anion gap: 16 — ABNORMAL HIGH (ref 5–15)
BUN: 9 mg/dL (ref 8–23)
CO2: 20 mmol/L — ABNORMAL LOW (ref 22–32)
Calcium: 8.8 mg/dL — ABNORMAL LOW (ref 8.9–10.3)
Chloride: 106 mmol/L (ref 98–111)
Creatinine, Ser: 0.91 mg/dL (ref 0.44–1.00)
GFR calc Af Amer: >60 mL/min (ref >60)
GFR calc non Af Amer: >60 mL/min (ref >60)
Glucose, Bld: 95 mg/dL (ref 70–99)
Potassium: 3.9 mmol/L (ref 3.5–5.1)
Sodium: 142 mmol/L (ref 135–145)
Total Bilirubin: 1.2 mg/dL (ref 0.3–1.2)
Total Protein: 6.4 g/dL — ABNORMAL LOW (ref 6.5–8.1)

## 2019-09-21 LAB — CUP PACEART INCLINIC DEVICE CHECK
Brady Statistic AP VP Percent: 0.3 %
Brady Statistic AP VS Percent: 0.1 %
Brady Statistic AS VP Percent: 97.8 %
Brady Statistic AS VS Percent: 1.9 %
Date Time Interrogation Session: 20210211180551
HighPow Impedance: 51 Ohm
Implantable Lead Implant Date: 20170612
Implantable Lead Implant Date: 20170612
Implantable Lead Implant Date: 20170612
Implantable Lead Location: 753858
Implantable Lead Location: 753859
Implantable Lead Location: 753860
Implantable Lead Model: 4398
Implantable Lead Model: 5076
Implantable Pulse Generator Implant Date: 20170612
Lead Channel Impedance Value: 342 Ohm
Lead Channel Impedance Value: 456 Ohm
Lead Channel Impedance Value: 722 Ohm
Lead Channel Pacing Threshold Amplitude: 0.75 V
Lead Channel Pacing Threshold Amplitude: 1 V
Lead Channel Pacing Threshold Amplitude: 1.25 V
Lead Channel Pacing Threshold Pulse Width: 0.4 ms
Lead Channel Pacing Threshold Pulse Width: 0.4 ms
Lead Channel Pacing Threshold Pulse Width: 0.8 ms
Lead Channel Sensing Intrinsic Amplitude: 3.4 mV
Lead Channel Sensing Intrinsic Amplitude: 9.9 mV

## 2019-09-21 LAB — CBC WITH DIFFERENTIAL/PLATELET
Abs Immature Granulocytes: 0.02 10*3/uL (ref 0.00–0.07)
Basophils Absolute: 0.1 10*3/uL (ref 0.0–0.1)
Basophils Relative: 1 %
Eosinophils Absolute: 0.3 10*3/uL (ref 0.0–0.5)
Eosinophils Relative: 3 %
HCT: 37.9 % (ref 36.0–46.0)
Hemoglobin: 11.1 g/dL — ABNORMAL LOW (ref 12.0–15.0)
Immature Granulocytes: 0 %
Lymphocytes Relative: 26 %
Lymphs Abs: 2.5 10*3/uL (ref 0.7–4.0)
MCH: 30 pg (ref 26.0–34.0)
MCHC: 29.3 g/dL — ABNORMAL LOW (ref 30.0–36.0)
MCV: 102.4 fL — ABNORMAL HIGH (ref 80.0–100.0)
Monocytes Absolute: 0.6 10*3/uL (ref 0.1–1.0)
Monocytes Relative: 6 %
Neutro Abs: 6.3 10*3/uL (ref 1.7–7.7)
Neutrophils Relative %: 64 %
Platelets: 182 10*3/uL (ref 150–400)
RBC: 3.7 MIL/uL — ABNORMAL LOW (ref 3.87–5.11)
RDW: 14.7 % (ref 11.5–15.5)
WBC: 9.6 10*3/uL (ref 4.0–10.5)
nRBC: 0 % (ref 0.0–0.2)

## 2019-09-21 LAB — TROPONIN I (HIGH SENSITIVITY)
Troponin I (High Sensitivity): 41 ng/L — ABNORMAL HIGH (ref <18)
Troponin I (High Sensitivity): 53 ng/L — ABNORMAL HIGH

## 2019-09-21 LAB — CBC
HCT: 33.9 % — ABNORMAL LOW (ref 36.0–46.0)
Hemoglobin: 10.4 g/dL — ABNORMAL LOW (ref 12.0–15.0)
MCH: 29.6 pg (ref 26.0–34.0)
MCHC: 30.7 g/dL (ref 30.0–36.0)
MCV: 96.6 fL (ref 80.0–100.0)
Platelets: 196 10*3/uL (ref 150–400)
RBC: 3.51 MIL/uL — ABNORMAL LOW (ref 3.87–5.11)
RDW: 14.7 % (ref 11.5–15.5)
WBC: 9.6 10*3/uL (ref 4.0–10.5)
nRBC: 0 % (ref 0.0–0.2)

## 2019-09-21 LAB — TSH: TSH: 1.284 u[IU]/mL (ref 0.350–4.500)

## 2019-09-21 LAB — MAGNESIUM: Magnesium: 1.7 mg/dL (ref 1.7–2.4)

## 2019-09-21 LAB — BRAIN NATRIURETIC PEPTIDE: B Natriuretic Peptide: 709.1 pg/mL — ABNORMAL HIGH (ref 0.0–100.0)

## 2019-09-21 MED ORDER — ALLOPURINOL 100 MG PO TABS
100.0000 mg | ORAL_TABLET | Freq: Every day | ORAL | Status: DC
  Administered 2019-09-21 – 2019-09-23 (×3): 100 mg via ORAL
  Filled 2019-09-21 (×4): qty 1

## 2019-09-21 MED ORDER — SODIUM CHLORIDE 0.9 % IV SOLN: INTRAVENOUS | Status: DC

## 2019-09-21 MED ORDER — CARVEDILOL 25 MG PO TABS
25.0000 mg | ORAL_TABLET | Freq: Two times a day (BID) | ORAL | Status: DC
  Administered 2019-09-21 – 2019-09-23 (×4): 25 mg via ORAL
  Filled 2019-09-21 (×4): qty 1

## 2019-09-21 MED ORDER — AMIODARONE HCL IN DEXTROSE 360-4.14 MG/200ML-% IV SOLN
30.0000 mg/h | INTRAVENOUS | Status: DC
  Administered 2019-09-22: 30 mg/h via INTRAVENOUS
  Filled 2019-09-21 (×4): qty 200

## 2019-09-21 MED ORDER — ONDANSETRON 4 MG PO TBDP
4.0000 mg | ORAL_TABLET | Freq: Three times a day (TID) | ORAL | Status: DC | PRN
  Filled 2019-09-21: qty 1

## 2019-09-21 MED ORDER — METOPROLOL TARTRATE 5 MG/5ML IV SOLN
5.0000 mg | Freq: Once | INTRAVENOUS | Status: AC
  Administered 2019-09-21: 5 mg via INTRAVENOUS
  Filled 2019-09-21: qty 5

## 2019-09-21 MED ORDER — ACETAMINOPHEN 500 MG PO TABS: 500.0000 mg | ORAL_TABLET | Freq: Four times a day (QID) | ORAL | Status: DC | PRN

## 2019-09-21 MED ORDER — SODIUM CHLORIDE 0.9% FLUSH: 3.0000 mL | INTRAVENOUS | Status: DC | PRN

## 2019-09-21 MED ORDER — SODIUM CHLORIDE 0.9% FLUSH: 3.0000 mL | Freq: Two times a day (BID) | INTRAVENOUS | Status: DC

## 2019-09-21 MED ORDER — HEPARIN SODIUM (PORCINE) 5000 UNIT/ML IJ SOLN
5000.0000 [IU] | Freq: Three times a day (TID) | INTRAMUSCULAR | Status: DC
  Administered 2019-09-21 – 2019-09-22 (×2): 5000 [IU] via SUBCUTANEOUS
  Filled 2019-09-21 (×2): qty 1

## 2019-09-21 MED ORDER — AMIODARONE LOAD VIA INFUSION
150.0000 mg | Freq: Once | INTRAVENOUS | Status: AC
  Administered 2019-09-21: 150 mg via INTRAVENOUS
  Filled 2019-09-21: qty 83.34

## 2019-09-21 MED ORDER — SODIUM CHLORIDE 0.9 % IV SOLN: 250.0000 mL | INTRAVENOUS | Status: DC | PRN

## 2019-09-21 MED ORDER — ASPIRIN EC 81 MG PO TBEC
81.0000 mg | DELAYED_RELEASE_TABLET | Freq: Every day | ORAL | Status: DC
  Administered 2019-09-21 – 2019-09-22 (×2): 81 mg via ORAL
  Filled 2019-09-21 (×2): qty 1

## 2019-09-21 MED ORDER — IMATINIB MESYLATE 400 MG PO TABS: 400.0000 mg | ORAL_TABLET | Freq: Every day | ORAL | Status: DC

## 2019-09-21 MED ORDER — MAGNESIUM SULFATE 2 GM/50ML IV SOLN
2.0000 g | Freq: Once | INTRAVENOUS | Status: AC
  Administered 2019-09-21: 2 g via INTRAVENOUS
  Filled 2019-09-21: qty 50

## 2019-09-21 MED ORDER — NITROGLYCERIN 0.4 MG SL SUBL: 0.4000 mg | SUBLINGUAL_TABLET | SUBLINGUAL | Status: DC | PRN

## 2019-09-21 MED ORDER — ATORVASTATIN CALCIUM 10 MG PO TABS
20.0000 mg | ORAL_TABLET | Freq: Every day | ORAL | Status: DC
  Administered 2019-09-22 – 2019-09-23 (×2): 20 mg via ORAL
  Filled 2019-09-21 (×3): qty 2

## 2019-09-21 MED ORDER — POTASSIUM CHLORIDE CRYS ER 20 MEQ PO TBCR
20.0000 meq | EXTENDED_RELEASE_TABLET | Freq: Once | ORAL | Status: AC
  Administered 2019-09-21: 20 meq via ORAL
  Filled 2019-09-21: qty 1

## 2019-09-21 MED ORDER — THYROID 30 MG PO TABS
15.0000 mg | ORAL_TABLET | Freq: Every day | ORAL | Status: DC
  Administered 2019-09-22 – 2019-09-23 (×2): 15 mg via ORAL
  Filled 2019-09-21 (×3): qty 1

## 2019-09-21 MED ORDER — LISINOPRIL 5 MG PO TABS
5.0000 mg | ORAL_TABLET | Freq: Every day | ORAL | Status: DC
  Administered 2019-09-21 – 2019-09-23 (×3): 5 mg via ORAL
  Filled 2019-09-21 (×3): qty 1

## 2019-09-21 MED ORDER — AMIODARONE HCL IN DEXTROSE 360-4.14 MG/200ML-% IV SOLN
60.0000 mg/h | INTRAVENOUS | Status: AC
  Administered 2019-09-21 – 2019-09-22 (×2): 60 mg/h via INTRAVENOUS
  Filled 2019-09-21: qty 200

## 2019-09-21 MED ORDER — VITAMIN D 25 MCG (1000 UNIT) PO TABS
5000.0000 [IU] | ORAL_TABLET | Freq: Every day | ORAL | Status: DC
  Administered 2019-09-21 – 2019-09-23 (×3): 5000 [IU] via ORAL
  Filled 2019-09-21 (×3): qty 5

## 2019-09-21 NOTE — Progress Notes (Signed)
CRT-D device check in [facility], added-on for patient-reported shock this AM. Thresholds and sensing consistent with previous device measurements. Lead impedance trends stable over time. 1 AF episode (<0.1%)--62min duration, occurred today beginning at 02:01. 3 treated VF-zone episodes: 1 PMVT/VF episode 09/21/19 at 02:01, converted to AF/VS rhythm after 35J shock, 1 episode of AF w/RVR vs AF/VT (double tachycardia) on 09/21/19 at 03:15, ATP x1 failed, late break after 35J shock x1 (and conversion of AF to SR). VF episode from 09/14/19 previously reviewed via [facility]link alert transmission. Dr. Lovena Le reviewed all events, recommended awaiting BMET/Mg results, which pt had drawn prior to visit. Pt aware of  DMV driving restrictions and shock plan. Pt assisted to lobby to await ride. Short time later, pt reportedly appeared to have seizure in lobby. Dr. Radford Pax, [place], recommended ICD interrogation with assessment by Dr. Lovena Le. Re-interrogated ICD, additional episode of PMVT/VF converted with 35J shock x1 noted 09/21/19 at 17:05 (time stamp slightly off), correlating with event in lobby. Discussed with Dr. Lovena Le, who recommended assessment at ED. Pt transported via EMS to [facility] ED.  Pt CRT pacing 97.3% of the time. Device programmed with appropriate safety margins. Heart failure diagnostics reviewed, trends suggest ongoing fluid accumulation since 08/03/19. Pt aware of auditory alerts. No changes made this session. Estimated longevity 4.7 years. Patient enrolled in remote follow up, new Carelink monitor ordered 09/21/19. Patient education completed including shock plan. ROV with Dr. Lovena Le TBD after hospital discharge.  See scanned-in report due to error while saving PDF.

## 2019-09-21 NOTE — Telephone Encounter (Signed)
Spoke with patient. She reports another ICD shock occurred around 02:00 this morning (also had an episode of PMVT/VF with shock on 2/4). She was standing at the bathroom sink washing her hands after an episode of vomiting, also had an episode of explosive diarrhea around 22:00 yesterday evening, history of chronic diarrhea per pt. Pt reports she felt dizzy prior to the shock, but not "pass-out dizzy". Missed all doses of medications yesterday, including carvedilol. Denies syncope, reports she felt much better after the shock and continued washing her hands. Drank some Gatorade and feels fine at this time.  Advised of [place] DMV driving restrictions x6 months. Pt reports she is unable to come into the office without driving herself. Unable to obtain a manual transmission, pt to call Carelink tech support for troubleshooting assistance. Advised will call back after discussing with Dr. Lovena Le. Pt in agreement with plan.  Per Dr. Lovena Le, plan to obtain BMET/Mg if pt able to get into the office today or tomorrow. Advised f/u with PCP for diarrhea/GI symptoms. Advised to resume taking cardiac medications and avoid missed doses.  Spoke with pt. Advised of recommendations. Pt reports her daughter could normally bring her in, but daughter's family has [place] and they are quarantining. Pt has not had recent in-person contact with her daughter or family members. [employer] is shipping her a new monitor, should arrive in 5 days. Pt states that she will plan to take an Melburn Popper to our office for lab work and an ICD interrogation this afternoon, scheduled for 14:00 DC appointment. She will resume cardiac medications, reports she has not taken magnesium since early January as she was told by PCP that Mg levels had normalized and she should discontinue supplementation. Reviewed shock plan, pt aware to seek emergency medical attention for an additional shock within 24hrs.

## 2019-09-21 NOTE — Telephone Encounter (Signed)
The pt states she heard, felt and saw her ICD go off. The pt states it felt like she was punched in the chest and saw a bright light on her chest. She vomited last night as well. I asked her can she have someone drive her into the office. She states she can drive her self. I told her once she is treated by her ICD she can not drive for 6 months. I let her talk with the device nurse Raquel Sarna, Rn.

## 2019-09-21 NOTE — H&P (Addendum)
The patient has been seen in conjunction with Cecilie Kicks, NP. All aspects of care have been considered and discussed. The patient has been personally interviewed, examined, and all clinical data has been reviewed.   H/O CAD, prior VF arrest, acute global reduction in EF that did not improve until re-sychronized. Along the way had 80% LAD that was stented shortly after cardiac arrest. Never documented to have MI. Eventual recovery in LVEF to normal by 03/2016.  Over past 7 days electrical instability document by device interrogation with pacing therapy 09/14/19, and 3 shock therapies within last 12-24 hours. Most recent in the elevator at [employer]. From Cardio-Oncology standpoint, she is on Imatinib (Gleevac) which can cause cardiotoxicity (will hold until discussed with EP).  Awake, alert oriented, and without angina in the prior days and none now.  Exam is normal except obesity and lymphedema.  IMPRESSION: Recurrent high grade ventricular ectopy requiring ICD discharge x 3 within last 12-24 hours. Electrolytes stable, and no clinical evidence acute ischemia. She does have a 78 year old LAD DES.  PLAN: Resume beta blocker therapy; IV amiodarone; NPO after MN for possible cath tomorrow to r/o ischemia as basis for malignant ventricuar arrhythmia; Consider holding Gleevac; EP to see in AM.      Cardiology Admission History and Physical:   Patient ID: Lisa [name] MRN: [id number]; DOB: 07-17-1941   Admission date: 09/21/2019  Primary Care Provider: [name], MD Primary Cardiologist: [name], MD  Primary Electrophysiologist:  None   Chief Complaint:  Multiple shocks with ICD  Patient Profile:   Lisa [name] is a 79 y.o. 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    History of Present Illness:   Lisa [name] with above hx last cath 2017 and DES to mLAD and otherwise clear coronary arteries. SF at that time was 25-30% , she did not improve by June with medical management and rec'd MDT BiV ICD for LBBB and EF of 25%.  She did improve with EF  To 55-60% by 04/2016.   (lisinopril and spironolactone stopped for hyperkalemia) She saw Dr. Harrington Challenger 07/2019 and she had been hospitalized at [facility] with cellulitis and C. Diff.  Her lisinopril was added back. She did have an echo in 05/2019 at [facility] with normal EF.    09/14/19 with dizziness and no shock, but + VF, was appropriately paced out.   Today she had ICD shock around 2 AM - she had been vomiting and had explosive diarrhea. Then again at 3 am.  Was seen in office today and had V fib with shock in waiting room, EMS called and pt here in ER - labs pending  Here in ER no chest pain and her SOB is no different than her usual with exertion only.  This afternoon she did not know she passed out or was shocked.    Interrogation with [facility] with V fib and shock.  Labs pending   She has not had any meds today, no coreg.  Will give lopressor 5 mg IV times one.   EKG:  The ECG that was done now was personally reviewed and demonstrates SR with BiV pacing PORTABLE CHEST 1 VIEW  COMPARISON:  Radiograph 11/02/2016  FINDINGS: Mild slightly left-sided pacemaker in place. Progressive  cardiomegaly from prior exam. Aortic atherosclerosis. Mild vascular congestion without edema. Possible trace left pleural effusion. No confluent airspace disease or pneumothorax. No acute osseous abnormalities are seen.  IMPRESSION: 1. Cardiomegaly with vascular congestion. 2. Possible left pleural effusion.  Aortic Atherosclerosis (ICD10-I70.0).  She has had 2 COVID vaccines second one was 09/15/19 -[employer]    Heart Pathway Score:     Past Medical History:  Diagnosis Date  . Cardiac arrest ([place]) 09/2015  . CHF  (congestive heart failure) ([place])   . Complication of anesthesia   . Hx: UTI (urinary tract infection)   . Hypertension   . Melanoma ([place])   . PONV (postoperative nausea and vomiting)   . PVC (premature ventricular contraction)    HISTORY OF  . Thyroid disease     Past Surgical History:  Procedure Laterality Date  . ABDOMINAL HYSTERECTOMY    . APPENDECTOMY    . CARDIAC CATHETERIZATION N/A 10/03/2015   Procedure: Left Heart Cath and Coronary Angiography;  Surgeon: [name], MD;  Location: [place] [facility];  Service: Cardiovascular;  Laterality: N/A;  . CARDIAC CATHETERIZATION N/A 10/07/2015   Procedure: Coronary Stent Intervention;  Surgeon: [name], MD;  Location: [place] [facility];  Service: Cardiovascular;  Laterality: N/A;  . EP IMPLANTABLE DEVICE N/A 01/20/2016   Procedure: BiV ICD Insertion CRT-D;  Surgeon: [name], MD;  Location: [place] [facility];  Service: Cardiovascular;  Laterality: N/A;  . TONSILLECTOMY    . TONSILLECTOMY    . TUBAL LIGATION       Medications Prior to Admission: Prior to Admission medications   Medication Sig Start Date End Date Taking? Authorizing Provider  acetaminophen (TYLENOL) 500 MG tablet Take 500-1,000 mg by mouth every 6 (six) hours as needed for moderate pain.    [provider]  allopurinol (ZYLOPRIM) 100 MG tablet Take 1 tablet by mouth daily. 06/15/17   [provider]  aspirin EC 81 MG tablet Take 81 mg by mouth daily.    [provider]  atorvastatin (LIPITOR) 20 MG tablet Take 1 tablet (20 mg total) by mouth daily. 05/07/16   Bensimhon, Shaune Pascal, MD  CALCIUM PO Take 1 tablet by mouth daily.    [provider]  carvedilol (COREG) 25 MG tablet Take 1 tablet (25 mg total) by mouth 2 (two) times daily. 09/06/19   Fay Records, MD  Cholecalciferol (VITAMIN D-3 PO) Take 5,000 Units by mouth daily.    [provider]  Coenzyme Q10 (COQ10 PO) Take 1 capsule by mouth as  needed.     [provider]  Cyanocobalamin (VITAMIN B-12 PO) Take 1 tablet by mouth daily.    [provider]  furosemide (LASIX) 20 MG tablet Take 1 tablet by mouth daily. Take 53m (1 tablet) daily, may take an additional 240m(1 tablet) 1-2 times per week for edema or shortness of breath. 09/08/17   [provider]  imatinib (GLEEVEC) 400 MG tablet Take 400 mg by mouth daily.  02/16/17   [provider]  lisinopril (ZESTRIL) 5 MG tablet Take 1 tablet (5 mg total) by mouth daily. 08/11/19   RoFay RecordsMD  MAGNESIUM PO Take 1 tablet by mouth daily.    [provider]  Multiple Vitamin (MULTIVITAMIN WITH MINERALS) TABS tablet Take 1 tablet by mouth daily.    [provider]  nitroGLYCERIN (NITROSTAT) 0.4 MG SL tablet Place 1 tablet (0.4 mg total) under the tongue every  5 (five) minutes x 3 doses as needed for chest pain. 02/28/18   [name], MD  ondansetron (ZOFRAN-ODT) 4 MG disintegrating tablet Take 1 tablet by mouth as needed. 06/02/19   [provider]  potassium chloride (K-DUR) 10 MEQ tablet Take one tablet as directed only on days when EXTRA dose of furosemide is taken 05/24/18   Fay Records, MD  thyroid (ARMOUR) 15 MG tablet Take 15 mg by mouth daily.    [provider]     Allergies:    Allergies  Allergen Reactions  . Flagyl [Metronidazole] Itching and Rash    Welts, itching  . Penicillins Hives, Itching and Rash    Has patient had a PCN reaction causing immediate rash, facial/tongue/throat swelling, SOB or lightheadedness with hypotension: Yes  Has patient had a PCN reaction causing severe rash involving mucus membranes or skin necrosis: Yes Has patient had a PCN reaction that required hospitalization No Has patient had a PCN reaction occurring within the last 10 years: NO If all of the above answers are "NO", then may proceed with Cephalosporin use.   . Sulfamethoxazole Rash  . Meperidine Nausea And  Vomiting  . Morphine And Related Nausea And Vomiting  . Sulfa Antibiotics Other (See Comments)    Some adhesive tapes pulls skin off  . Wheat Bran Other (See Comments)    Runny nose  . Doxycycline Rash and Nausea Only    Made her "feel terrible" Made her "feel terrible"  . Lanolin Itching and Rash    Social History:   Social History   Socioeconomic History  . Marital status: Divorced    Spouse name: Not on file  . Number of children: Not on file  . Years of education: Not on file  . Highest education level: Not on file  Occupational History  . Not on file  Tobacco Use  . Smoking status: Never Smoker  . Smokeless tobacco: Never Used  Substance and Sexual Activity  . Alcohol use: No    Alcohol/week: 0.0 standard drinks  . Drug use: No  . Sexual activity: Not on file  Other Topics Concern  . Not on file  Social History Narrative  . Not on file   Social Determinants of Health   Financial Resource Strain:   . Difficulty of Paying Living Expenses: Not on file  Food Insecurity:   . Worried About Charity fundraiser in the Last Year: Not on file  . Ran Out of Food in the Last Year: Not on file  Transportation Needs:   . Lack of Transportation (Medical): Not on file  . Lack of Transportation (Non-Medical): Not on file  Physical Activity:   . Days of Exercise per Week: Not on file  . Minutes of Exercise per Session: Not on file  Stress:   . Feeling of Stress : Not on file  Social Connections:   . Frequency of Communication with Friends and Family: Not on file  . Frequency of Social Gatherings with Friends and Family: Not on file  . Attends Religious Services: Not on file  . Active Member of Clubs or Organizations: Not on file  . Attends Archivist Meetings: Not on file  . Marital Status: Not on file  Intimate Partner Violence:   . Fear of Current or Ex-Partner: Not on file  . Emotionally Abused: Not on file  . Physically Abused: Not on file  . Sexually  Abused: Not on file    Family  History:   The patient's family history includes Coronary artery disease in her mother; Heart attack in her maternal grandfather and paternal grandfather; Multiple myeloma in her father; Thyroid disease in her mother.    ROS:  Please see the history of present illness.  General:no colds or fevers, no weight changes Skin:no rashes or ulcers HEENT:no blurred vision, no congestion CV:see HPI PUL:see HPI GI:no diarrhea constipation or melena, no indigestion GU:no hematuria, no dysuria MS:no joint pain, no claudication Neuro:no syncope, no lightheadedness Endo:no diabetes, no thyroid disease  All other ROS reviewed and negative.     Physical Exam/Data:   Vitals:   09/21/19 1653  Weight: 118 kg  Height: '5\' 2"'  (1.575 m)   No intake or output data in the 24 hours ending 09/21/19 1656 Last 3 Weights 09/21/2019 09/04/2019 07/21/2019  Weight (lbs) 260 lb 2.3 oz 260 lb 260 lb 12.8 oz  Weight (kg) 118 kg 117.935 kg 118.298 kg     Body mass index is 47.58 kg/m.  General:  Well nourished, well developed, in no acute distress HEENT: normal Lymph: no adenopathy Neck: no JVD Endocrine:  No thryomegaly Vascular: No carotid bruits; peal pulses difficult to palpate due to lymphedema  Cardiac:  normal S1, S2; RRR; no murmur gallup rub or click Lungs:  clear to auscultation bilaterally, no wheezing, rhonchi or rales  Abd: soft, nontender, no hepatomegaly  Ext: 3+ chronic edema with lymphedema Musculoskeletal:  No deformities, BUE and BLE strength normal and equal Skin: warm and dry  Neuro:  Alert and oriented X 3 MAE follows commands, no focal abnormalities noted Psych:  Normal affect     Relevant CV Studies: Echo 05/2019  SUMMARY There is normal left ventricular wall thickness. The left ventricle is mildly dilated.  Left ventricular systolic function is normal. LV ejection fraction = 60-65%. The left ventricular wall motion is normal.  Left  ventricular filling pattern is indeterminate. The right ventricle is normal in size and function. Device lead in the right ventricle Normal biatrial size. There is no significant valvular stenosis or regurgitation. Pericardial fat pad is noted. There is a small loculate pericardial effusion seen in the subcostal view. Significant respiratory variation of mitral inflow There is significant respiratory variation of mitral inflow. No cardiac tamponade. Clinical correlation is needed to assess a small loculated pericardial  effusion. Difficulty to compare with the prior study due to different image quality. - FINDINGS:  LEFT VENTRICLE There is normal left ventricular wall thickness. The left ventricle is mildly  dilated. Left ventricular systolic function is normal. LV ejection fraction =  60-65%. Left ventricular filling pattern is indeterminate. The left  ventricular wall motion is normal. -  RIGHT VENTRICLE The right ventricle is normal in size and function. Device lead in the right  ventricle.  LEFT ATRIUM The left atrial size is normal.  RIGHT ATRIUM  Right atrial size is normal. - AORTIC VALVE There is aortic valve sclerosis. There is no aortic stenosis. There is no  aortic regurgitation. - MITRAL VALVE The mitral valve leaflets appear normal. There is trace mitral regurgitation. - TRICUSPID VALVE Structurally normal tricuspid valve. There is mild tricuspid regurgitation.  There was insufficient TR detected to calculate RV systolic pressure. - PULMONIC VALVE Structurally normal pulmonic valve. There is no pulmonic valvular  regurgitation. - ARTERIES The aortic sinus is normal size. - VENOUS IVC size was mildly dilated. - EFFUSION Pericardial fat pad is noted. Significant respiratory variation of mitral  inflow. There is a small  loculate pericardial effusion seen in the subcostal  view. No cardiac tamponade. - - MMode/2D Measurements & Calculations IVSd:  0.91 cm LVIDd: 5.5 cm LVPWd: 0.98 cm LVIDs: 4.1 cm LA dim: 4.0 cm EDV(MOD-sp4): 71.6 ml ESV(MOD-sp4): 30.4 ml Ao sinus diam: 3.1 cm LVOT diam: 2.1 cm SV(MOD-sp4): 41.2 ml SI(MOD-sp4): 19.3 ml/m2 IVC 1: 2.1 cm LA area A2: 21.4 cm2 LA area A4: 23.8 cm2 LA vol: 73.2 ml LA vol index: 34.2 ml/m2 RA area A4: 16.5 cm2 Doppler Measurements & Calculations MV E max vel: 109.6 cm/sec MV A max vel: 89.7 cm/sec MV E/A: 1.2 Med Peak E' Vel: 8.3 cm/sec Lat Peak E' Vel: 6.1 cm/sec E/Lat E`: 18.0 E/Med E`: 13.3 MV dec time: 0.19 sec SV(LVOT): 71.8 ml Ao V2 max: 233.7 cm/sec Ao max PG: 21.8 mmHg Ao V2 mean: 168.2 cm/sec Ao mean PG: 12.6 mmHg Ao V2 VTI: 51.5 cm AVA (VTI): 1.4 cm2 LV V1 VTI: 20.2 cm TR max vel: 182.3 cm/sec TR max PG: 13.3 mmHg AS Dimensionless Index (VTI): 0.39 AVAi(VTI) cm^2/m^2: 0.65 cm2 SV index(LVOT): 33.6 ml/m2  Laboratory Data:  High Sensitivity Troponin:  No results for input(s): TROPONINIHS in the last 720 hours.    ChemistryNo results for input(s): NA, K, CL, CO2, GLUCOSE, BUN, CREATININE, CALCIUM, GFRNONAA, GFRAA, ANIONGAP in the last 168 hours.  No results for input(s): PROT, ALBUMIN, AST, ALT, ALKPHOS, BILITOT in the last 168 hours. HematologyNo results for input(s): WBC, RBC, HGB, HCT, MCV, MCH, MCHC, RDW, PLT in the last 168 hours. BNPNo results for input(s): BNP, PROBNP in the last 168 hours.  DDimer No results for input(s): DDIMER in the last 168 hours.   Radiology/Studies:  No results found.      No chest pain Assessment and Plan:   1. VT storm with 3 episodes today and one on the 3rd of Feb.  She has had vomiting and diarrhea If electrolytes normal will begin IV amiodarone, if abnromal will replace and then add amiodarone.  IV lopressor now with her elevated BP and no BB today. Then back on coreg 25 BID (it had been decreased in Dec 2020 but she has increased back to 25 BID) 2. CAD with hx stent to mLAD in 2017 otherwise normal cors.    3. Cardiomyopathy with MI in 2017 and with no increase in ER with medication she rec'd BiV ICD MDT in 2017 and a few months later EF to normal and remains normal.  CXR with some edema hold off on diuretics for now until stable from V tach - not any more SOB than usual.  4. Will do covid screening but she has had her 2 doses of vaccine.   5. Recent diarrhea and vomiting, diarrhea is not uncommon.  6. CML per oncology at [facility] 7. Thyroid disease check TSH      Severity of Illness: The appropriate patient status for this patient is INPATIENT. Inpatient status is judged to be reasonable and necessary in order to provide the required intensity of service to ensure the patient's safety. The patient's presenting symptoms, physical exam findings, and initial radiographic and laboratory data in the context of their chronic comorbidities is felt to place them at high risk for further clinical deterioration. Furthermore, it is not anticipated that the patient will be medically stable for discharge from the hospital within 2 midnights of admission. The following factors support the patient status of inpatient.   " The patient's presenting symptoms include V tach with arrest, and ICD shock. "  The worrisome physical exam findings include PVCs. " The initial radiographic and laboratory data are worrisome because of abnormal. " The chronic co-morbidities include CAD, CM ICD vomiting.   * I certify that at the point of admission it is my clinical judgment that the patient will require inpatient hospital care spanning beyond 2 midnights from the point of admission due to high intensity of service, high risk for further deterioration and high frequency of surveillance required.*    For questions or updates, please contact [place] Please consult [email or web address] for contact info under        Signed, Cecilie Kicks, NP  09/21/2019 4:56 PM

## 2019-09-21 NOTE — Telephone Encounter (Signed)
The pt is getting a new monitor and will receive it in 3-5 days.

## 2019-09-21 NOTE — ED Provider Notes (Signed)
[facility] EMERGENCY DEPARTMENT Provider Note   CSN: [id number] Arrival date & time: 09/21/19  1645     History Chief Complaint  Patient presents with  . AICD Fired    Lisa [name] is a 79 y.o. female.  79 year old female with prior medical history as detailed below presents for evaluation following discharge of her AICD.  Patient reports that she was sent directly for admission from the cardiology office.  Her device was interrogated there.  She reports feeling 1 AICD discharge earlier today.  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  She is currently without complaint.  She denies current chest pain or shortness of breath.  She denies associated nausea, vomiting, fever.  She reports that just prior to the shock that she felt she felt dizzy.  The history is provided by the patient and medical records.  Illness Location:  AICD discharge Onset quality:  Sudden Duration:  1 day Timing:  Intermittent Progression:  Waxing and waning Chronicity:  New      Past Medical History:  Diagnosis Date  . Cardiac arrest ([place]) 09/2015  . CHF (congestive heart failure) ([place])   . Complication of anesthesia   . Hx: UTI (urinary tract infection)   . Hypertension   . Melanoma ([place])   . PONV (postoperative nausea and vomiting)   . PVC (premature ventricular contraction)    HISTORY OF  . Thyroid disease     Patient Active Problem List   Diagnosis Date Noted  . 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 Acute bronchitis 11/02/2016  . Unstable angina ([place]) 10/07/2015  . Chronic systolic heart failure ([place])   . PVC's (premature ventricular contractions)   . Ventricular fibrillation ([place]) 10/03/2015  . Cardiac arrest ([place]) 10/03/2015  . Essential hypertension 09/09/2015  . Lymphedema 02/21/2015  . Other specified hypothyroidism 02/21/2015    Past Surgical History:  Procedure  Laterality Date  . ABDOMINAL HYSTERECTOMY    . APPENDECTOMY    . CARDIAC CATHETERIZATION N/A 10/03/2015   Procedure: Left Heart Cath and Coronary Angiography;  Surgeon: Jurni Cesaro M [place], MD;  Location: [facility];  Service: Cardiovascular;  Laterality: N/A;  . CARDIAC CATHETERIZATION N/A 10/07/2015   Procedure: Coronary Stent Intervention;  Surgeon: [name], MD;  Location: [facility];  Service: Cardiovascular;  Laterality: N/A;  . EP IMPLANTABLE DEVICE N/A 01/20/2016   Procedure: BiV ICD Insertion CRT-D;  Surgeon: [name], MD;  Location: [facility];  Service: Cardiovascular;  Laterality: N/A;  . TONSILLECTOMY    . TONSILLECTOMY    . TUBAL LIGATION       OB History   No obstetric history on file.     Family History  Problem Relation Age of Onset  . Coronary artery disease Mother   . Thyroid disease Mother   . Multiple myeloma Father   . Heart attack Maternal Grandfather   . Heart attack Paternal Grandfather     Social History   Tobacco Use  . Smoking status: Never Smoker  . Smokeless tobacco: Never Used  Substance Use Topics  . Alcohol use: No    Alcohol/week: 0.0 standard drinks  . Drug use: No    Home Medications Prior to Admission medications   Medication Sig Start Date End Date Taking? Authorizing Provider  acetaminophen (TYLENOL) 500 MG tablet Take 500-1,000 mg by mouth every 6 (six) hours as needed for  moderate pain.    [provider]  allopurinol (ZYLOPRIM) 100 MG tablet Take 1 tablet by mouth daily. 06/15/17   [provider]  aspirin EC 81 MG tablet Take 81 mg by mouth daily.    [provider]  atorvastatin (LIPITOR) 20 MG tablet Take 1 tablet (20 mg total) by mouth daily. 05/07/16   Bensimhon, Shaune Pascal, MD  CALCIUM PO Take 1 tablet by mouth daily.    [provider]  carvedilol (COREG) 25 MG tablet Take 1 tablet (25 mg total) by mouth 2 (two) times daily. 09/06/19   Fay Records, MD    Cholecalciferol (VITAMIN D-3 PO) Take 5,000 Units by mouth daily.    [provider]  Coenzyme Q10 (COQ10 PO) Take 1 capsule by mouth as needed.     [provider]  Cyanocobalamin (VITAMIN B-12 PO) Take 1 tablet by mouth daily.    [provider]  furosemide (LASIX) 20 MG tablet Take 1 tablet by mouth daily. Take 73m (1 tablet) daily, may take an additional 214m(1 tablet) 1-2 times per week for edema or shortness of breath. 09/08/17   [provider]  imatinib (GLEEVEC) 400 MG tablet Take 400 mg by mouth daily.  02/16/17   [provider]  lisinopril (ZESTRIL) 5 MG tablet Take 1 tablet (5 mg total) by mouth daily. 08/11/19   RoFay RecordsMD  MAGNESIUM PO Take 1 tablet by mouth daily.    [provider]  Multiple Vitamin (MULTIVITAMIN WITH MINERALS) TABS tablet Take 1 tablet by mouth daily.    [provider]  nitroGLYCERIN (NITROSTAT) 0.4 MG SL tablet Place 1 tablet (0.4 mg total) under the tongue every 5 (five) minutes x 3 doses as needed for chest pain. 02/28/18   TaEvans LanceMD  ondansetron (ZOFRAN-ODT) 4 MG disintegrating tablet Take 1 tablet by mouth as needed. 06/02/19   [provider]  potassium chloride (K-DUR) 10 MEQ tablet Take one tablet as directed only on days when EXTRA dose of furosemide is taken 05/24/18   RoFay RecordsMD  thyroid (ARMOUR) 15 MG tablet Take 15 mg by mouth daily.    [provider]    Allergies    Flagyl [metronidazole], Penicillins, Sulfamethoxazole, Meperidine, Morphine and related, Sulfa antibiotics, Wheat bran, Doxycycline, and Lanolin  Review of Systems   Review of Systems  All other systems reviewed and are negative.   Physical Exam Updated Vital Signs BP (!) 179/85   Pulse 94   Temp 98.6 F (37 C)   Resp (!) 21   Ht '5\' 2"'  (1.575 m)   Wt 118 kg   LMP 02/14/1995   SpO2 96%   BMI 47.58 kg/m   Physical Exam Vitals and nursing note reviewed.   Constitutional:      General: She is not in acute distress.    Appearance: Normal appearance. She is well-developed.  HENT:     Head: Normocephalic and atraumatic.  Eyes:     Conjunctiva/sclera: Conjunctivae normal.     Pupils: Pupils are equal, round, and reactive to light.  Cardiovascular:     Rate and Rhythm: Normal rate and regular rhythm.     Heart sounds: Normal heart sounds.  Pulmonary:     Effort: Pulmonary effort is normal. No respiratory distress.     Breath sounds: Normal breath sounds.  Abdominal:     General: There is no distension.     Palpations: Abdomen is soft.  Tenderness: There is no abdominal tenderness.  Musculoskeletal:        General: No deformity. Normal range of motion.     Cervical back: Normal range of motion and neck supple.  Skin:    General: Skin is warm and dry.  Neurological:     Mental Status: She is alert and oriented to person, place, and time. Mental status is at baseline.     ED Results / Procedures / Treatments   Labs (all labs ordered are listed, but only abnormal results are displayed) Labs Reviewed  RESPIRATORY PANEL BY RT PCR (FLU A&B, COVID)  COMPREHENSIVE METABOLIC PANEL  CBC WITH DIFFERENTIAL/PLATELET  MAGNESIUM  TROPONIN I (HIGH SENSITIVITY)  TROPONIN I (HIGH SENSITIVITY)    EKG EKG Interpretation  Date/Time:  Thursday September 21 2019 17:02:34 EST Ventricular Rate:  83 PR Interval:    QRS Duration: 138 QT Interval:  463 QTC Calculation: 545 R Axis:   -64 Text Interpretation: Sinus rhythm Nonspecific IVCD with LAD Left ventricular hypertrophy Confirmed by Dene Gentry [phone]) on 09/21/2019 5:31:19 PM   Radiology DG Chest Port 1 View  Result Date: 09/21/2019 CLINICAL DATA:  Chest pain. AICD fired today. EXAM: PORTABLE CHEST 1 VIEW COMPARISON:  Radiograph 11/02/2016 FINDINGS: Mild slightly left-sided pacemaker in place. Progressive cardiomegaly from prior exam. Aortic atherosclerosis. Mild vascular congestion  without edema. Possible trace left pleural effusion. No confluent airspace disease or pneumothorax. No acute osseous abnormalities are seen. IMPRESSION: 1. Cardiomegaly with vascular congestion. 2. Possible left pleural effusion. Aortic Atherosclerosis (ICD10-I70.0). Electronically Signed   By: Keith Rake M.D.   On: 09/21/2019 18:04    Procedures Procedures (including critical care time)  Medications Ordered in ED Medications  metoprolol tartrate (LOPRESSOR) injection 5 mg (has no administration in time range)    ED Course  I have reviewed the triage vital signs and the nursing notes.  Pertinent labs & imaging results that were available during my care of the patient were reviewed by me and considered in my medical decision making (see chart for details).    MDM Rules/Calculators/A&P                      MDM  Screen complete  SHAUNIE BOEHM was evaluated in Emergency Department on 09/21/2019 for the symptoms described in the history of present illness. She was evaluated in the context of the global COVID-19 pandemic, which necessitated consideration that the patient might be at risk for infection with the SARS-CoV-2 virus that causes COVID-19. Institutional protocols and algorithms that pertain to the evaluation of patients at risk for COVID-19 are in a state of rapid change based on information released by regulatory bodies including the CDC and federal and state organizations. These policies and algorithms were followed during the patient's care in the ED.   Patient is presenting for evaluation following A ICD discharge.  Patient will be admitted to cardiology service for further work-up and treatment.      Final Clinical Impression(s) / ED Diagnoses Final diagnoses:  AICD discharge    Rx / DC Orders ED Discharge Orders    None       Valarie Merino, MD 09/21/19 Bosie Helper

## 2019-09-21 NOTE — ED Triage Notes (Signed)
Pt BIB [facility] for eval of defibrillator fired x 3. Pt had defib fired last night at 0200, once at 1515 today and then once more around 1610 while leaving cardiologist office today in the lobby.  Pt reports dizziness prior to episodes. States she sometimes feels it fire, sometimes she doesn't. Pt arrives in [facility], w/ no acute complaints at this time. Pt reports she has had some N/V yesterday, with some diarrhea.

## 2019-09-22 ENCOUNTER — Telehealth: Payer: Self-pay | Admitting: *Deleted

## 2019-09-22 ENCOUNTER — Encounter (HOSPITAL_COMMUNITY): Payer: Self-pay | Admitting: Interventional Cardiology

## 2019-09-22 ENCOUNTER — Encounter (HOSPITAL_COMMUNITY)
Admission: EM | Disposition: A | Discharge status: Home / Self Care | Payer: Self-pay | Source: Home / Self Care | Attending: Interventional Cardiology

## 2019-09-22 DIAGNOSIS — I2510 Atherosclerotic heart disease of native coronary artery without angina pectoris: Secondary | ICD-10-CM

## 2019-09-22 HISTORY — PX: CARDIAC CATHETERIZATION: SHX172

## 2019-09-22 HISTORY — PX: LEFT HEART CATH AND CORONARY ANGIOGRAPHY: CATH118249

## 2019-09-22 LAB — BASIC METABOLIC PANEL
BUN/Creatinine Ratio: 11 — ABNORMAL LOW (ref 12–28)
BUN: 10 mg/dL (ref 8–27)
CO2: 24 mmol/L (ref 20–29)
Calcium: 9.2 mg/dL (ref 8.7–10.3)
Chloride: 104 mmol/L (ref 96–106)
Creatinine, Ser: 0.91 mg/dL (ref 0.57–1.00)
GFR calc Af Amer: 70 mL/min/{1.73_m2} (ref >59)
GFR calc non Af Amer: 61 mL/min/{1.73_m2} (ref >59)
Glucose: 100 mg/dL — ABNORMAL HIGH (ref 65–99)
Potassium: 3.5 mmol/L (ref 3.5–5.2)
Sodium: 143 mmol/L (ref 134–144)

## 2019-09-22 LAB — RESPIRATORY PANEL BY RT PCR (FLU A&B, COVID)
Influenza A by PCR: NEGATIVE
Influenza B by PCR: NEGATIVE
SARS Coronavirus 2 by RT PCR: NEGATIVE

## 2019-09-22 LAB — CREATININE, SERUM
Creatinine, Ser: 0.96 mg/dL (ref 0.44–1.00)
GFR calc Af Amer: >60 mL/min (ref >60)
GFR calc non Af Amer: 57 mL/min — ABNORMAL LOW (ref >60)

## 2019-09-22 LAB — CBC
HCT: 29.9 % — ABNORMAL LOW (ref 36.0–46.0)
Hemoglobin: 9.4 g/dL — ABNORMAL LOW (ref 12.0–15.0)
MCH: 30 pg (ref 26.0–34.0)
MCHC: 31.4 g/dL (ref 30.0–36.0)
MCV: 95.5 fL (ref 80.0–100.0)
Platelets: 181 10*3/uL (ref 150–400)
RBC: 3.13 MIL/uL — ABNORMAL LOW (ref 3.87–5.11)
RDW: 14.7 % (ref 11.5–15.5)
WBC: 7.9 10*3/uL (ref 4.0–10.5)
nRBC: 0 % (ref 0.0–0.2)

## 2019-09-22 LAB — MAGNESIUM: Magnesium: 1.7 mg/dL (ref 1.6–2.3)

## 2019-09-22 SURGERY — LEFT HEART CATH AND CORONARY ANGIOGRAPHY
Anesthesia: LOCAL

## 2019-09-22 MED ORDER — LOPERAMIDE HCL 2 MG PO CAPS
2.0000 mg | ORAL_CAPSULE | ORAL | Status: DC | PRN
  Administered 2019-09-22: 2 mg via ORAL
  Filled 2019-09-22: qty 1

## 2019-09-22 MED ORDER — HEPARIN SODIUM (PORCINE) 5000 UNIT/ML IJ SOLN: 5000.0000 [IU] | Freq: Three times a day (TID) | INTRAMUSCULAR | Status: DC

## 2019-09-22 MED ORDER — LABETALOL HCL 5 MG/ML IV SOLN: 10.0000 mg | INTRAVENOUS | Status: AC | PRN

## 2019-09-22 MED ORDER — SODIUM CHLORIDE 0.9 % IV SOLN: 250.0000 mL | INTRAVENOUS | Status: DC | PRN

## 2019-09-22 MED ORDER — ASPIRIN 81 MG PO CHEW
81.0000 mg | CHEWABLE_TABLET | ORAL | Status: AC
  Administered 2019-09-22: 81 mg via ORAL
  Filled 2019-09-22: qty 1

## 2019-09-22 MED ORDER — SODIUM CHLORIDE 0.9% FLUSH
3.0000 mL | Freq: Two times a day (BID) | INTRAVENOUS | Status: DC
  Administered 2019-09-23: 3 mL via INTRAVENOUS

## 2019-09-22 MED ORDER — SODIUM CHLORIDE 0.9 % WEIGHT BASED INFUSION: 1.0000 mL/kg/h | INTRAVENOUS | Status: DC

## 2019-09-22 MED ORDER — MIDAZOLAM HCL 2 MG/2ML IJ SOLN
INTRAMUSCULAR | Status: AC
  Filled 2019-09-22: qty 2

## 2019-09-22 MED ORDER — HEPARIN SODIUM (PORCINE) 1000 UNIT/ML IJ SOLN
INTRAMUSCULAR | Status: AC
  Filled 2019-09-22: qty 1

## 2019-09-22 MED ORDER — IOHEXOL 350 MG/ML SOLN
INTRAVENOUS | Status: DC | PRN
  Administered 2019-09-22: 14:00:00 80 mL

## 2019-09-22 MED ORDER — HEPARIN (PORCINE) IN NACL 1000-0.9 UT/500ML-% IV SOLN
INTRAVENOUS | Status: DC | PRN
  Administered 2019-09-22 (×2): 500 mL

## 2019-09-22 MED ORDER — SODIUM CHLORIDE 0.9 % WEIGHT BASED INFUSION
3.0000 mL/kg/h | INTRAVENOUS | Status: DC
  Administered 2019-09-22: 3 mL/kg/h via INTRAVENOUS

## 2019-09-22 MED ORDER — SODIUM CHLORIDE 0.9% FLUSH: 3.0000 mL | INTRAVENOUS | Status: DC | PRN

## 2019-09-22 MED ORDER — VERAPAMIL HCL 2.5 MG/ML IV SOLN
INTRAVENOUS | Status: DC | PRN
  Administered 2019-09-22: 10 mL via INTRA_ARTERIAL

## 2019-09-22 MED ORDER — HEPARIN (PORCINE) IN NACL 1000-0.9 UT/500ML-% IV SOLN
INTRAVENOUS | Status: AC
  Filled 2019-09-22: qty 1000

## 2019-09-22 MED ORDER — AMIODARONE HCL 200 MG PO TABS
400.0000 mg | ORAL_TABLET | Freq: Two times a day (BID) | ORAL | Status: DC
  Administered 2019-09-22 – 2019-09-23 (×3): 400 mg via ORAL
  Filled 2019-09-22 (×3): qty 2

## 2019-09-22 MED ORDER — SODIUM CHLORIDE 0.9 % IV SOLN: INTRAVENOUS | Status: AC

## 2019-09-22 MED ORDER — LIDOCAINE HCL (PF) 1 % IJ SOLN
INTRAMUSCULAR | Status: AC
  Filled 2019-09-22: qty 30

## 2019-09-22 MED ORDER — IMATINIB MESYLATE 100 MG PO TABS
400.0000 mg | ORAL_TABLET | Freq: Every day | ORAL | Status: DC
  Administered 2019-09-22: 400 mg via ORAL
  Filled 2019-09-22 (×2): qty 4

## 2019-09-22 MED ORDER — FENTANYL CITRATE (PF) 100 MCG/2ML IJ SOLN
INTRAMUSCULAR | Status: DC | PRN
  Administered 2019-09-22: 25 ug via INTRAVENOUS

## 2019-09-22 MED ORDER — HYDRALAZINE HCL 20 MG/ML IJ SOLN: 10.0000 mg | INTRAMUSCULAR | Status: AC | PRN

## 2019-09-22 MED ORDER — FENTANYL CITRATE (PF) 100 MCG/2ML IJ SOLN
INTRAMUSCULAR | Status: AC
  Filled 2019-09-22: qty 2

## 2019-09-22 MED ORDER — MIDAZOLAM HCL 2 MG/2ML IJ SOLN
INTRAMUSCULAR | Status: DC | PRN
  Administered 2019-09-22: 1 mg via INTRAVENOUS

## 2019-09-22 MED ORDER — LIDOCAINE HCL (PF) 1 % IJ SOLN
INTRAMUSCULAR | Status: DC | PRN
  Administered 2019-09-22: 2 mL

## 2019-09-22 MED ORDER — HEPARIN SODIUM (PORCINE) 1000 UNIT/ML IJ SOLN
INTRAMUSCULAR | Status: DC | PRN
  Administered 2019-09-22: 6000 [IU] via INTRAVENOUS

## 2019-09-22 SURGICAL SUPPLY — 13 items
BRACE RADIAL COMPRESSION RADST (HEMOSTASIS) ×1 IMPLANT
CATH 5FR JL3.5 JR4 ANG PIG MP (CATHETERS) ×1 IMPLANT
DEVICE RAD COMP TR BAND LRG (VASCULAR PRODUCTS) ×1 IMPLANT
GLIDESHEATH SLEND SS 6F .021 (SHEATH) ×1 IMPLANT
GUIDEWIRE INQWIRE 1.5J.035X260 (WIRE) IMPLANT
INQWIRE 1.5J .035X260CM (WIRE) ×2
KIT HEART LEFT (KITS) ×2 IMPLANT
PACK CARDIAC CATHETERIZATION (CUSTOM PROCEDURE TRAY) ×2 IMPLANT
SHEATH PROBE COVER 6X72 (BAG) ×1 IMPLANT
SYR MEDRAD MARK 7 150ML (SYRINGE) ×2 IMPLANT
TRANSDUCER W/STOPCOCK (MISCELLANEOUS) ×2 IMPLANT
TUBING CIL FLEX 10 FLL-RA (TUBING) ×2 IMPLANT
WIRE HI TORQ VERSACORE-J 145CM (WIRE) ×1 IMPLANT

## 2019-09-22 NOTE — Progress Notes (Signed)
Radstat band in place on right radial artery site. Band loosened approximately 26mm. Site level 0. Brisk capillary refillspo2 97% aas measured in right hand.

## 2019-09-22 NOTE — Plan of Care (Signed)
  Problem: Education: Goal: Knowledge of General Education information will improve Description: Including pain rating scale, medication(s)/side effects and non-pharmacologic comfort measures Outcome: Progressing   Problem: Health Behavior/Discharge Planning: Goal: Ability to manage health-related needs will improve Outcome: Progressing   Problem: Activity: Goal: Risk for activity intolerance will decrease Outcome: Progressing   Problem: Coping: Goal: Level of anxiety will decrease Outcome: Progressing   Problem: Pain Managment: Goal: General experience of comfort will improve Outcome: Progressing   Elesa Hacker, RN

## 2019-09-22 NOTE — Consult Note (Addendum)
ELECTROPHYSIOLOGY CONSULT NOTE    Patient ID: RECHY BOST MRN: [id number], DOB/AGE: 1941/01/06 79 y.o.  Admit date: 09/21/2019 Date of Consult: 09/22/2019  Primary Physician: [name], MD Primary Cardiologist: Harrington Challenger Electrophysiologist: Lovena Le  Patient Profile: Lisa [name] is a 79 y.o. female with a history of obesity, hypertension, hyperlipidemia, CAD s/p DES to LAD 1696, chronic systolic heart failure with normalization of EF post CRT who is being seen today for the evaluation of VF and ICD shocks at the request of Dr Tamala Julian.  HPI:  Lisa [name] is a 79 y.o. female with the above past medical history.  She is s/p MDT CRTD in 2017 and has had recovery of her EF. She is followed at [facility] and is being treated with Gleevac. She is considered a major responder but has at least 2 more years of therapy.  She had an appropriate ICD shock on 09/13/18 while sitting on the toilet. She then had recurrent HV therapy 2/11 x2 (once at home and once while in office lobby).  She was referred to the hospital for further evaluation. She has chronic shortness of breath on exertion.  She does not have chest pain.  Labs on admission were notable for K+ and Mg+ on low side of normal.    Past Medical History:  Diagnosis Date   CAD in native artery 2017   stent to LAD   Cardiac arrest ([place]) 09/2015   CHF (congestive heart failure) (HCC)    CML (chronic myelocytic leukemia) ([place])    Complication of anesthesia    Hx: UTI (urinary tract infection)    Hypertension    Melanoma (HCC)    PONV (postoperative nausea and vomiting)    PVC (premature ventricular contraction)    HISTORY OF   Thyroid disease    V tach ([place])      Surgical History:  Past Surgical History:  Procedure Laterality Date   ABDOMINAL HYSTERECTOMY     APPENDECTOMY     CARDIAC CATHETERIZATION N/A 10/03/2015   Procedure: Left Heart Cath and Coronary Angiography;  Surgeon: [name], MD;  Location:  [facility];  Service: Cardiovascular;  Laterality: N/A;   CARDIAC CATHETERIZATION N/A 10/07/2015   Procedure: Coronary Stent Intervention;  Surgeon: [name], MD;  Location: [place] [facility];  Service: Cardiovascular;  Laterality: N/A;   EP IMPLANTABLE DEVICE N/A 01/20/2016   Procedure: BiV ICD Insertion CRT-D;  Surgeon: [name], MD;  Location: [facility];  Service: Cardiovascular;  Laterality: N/A;   TONSILLECTOMY     TONSILLECTOMY     TUBAL LIGATION       Medications Prior to Admission  Medication Sig Dispense Refill Last Dose   acetaminophen (TYLENOL) 500 MG tablet Take 500-1,000 mg by mouth every 6 (six) hours as needed for moderate pain.   09/16/2019 at Unknown time   allopurinol (ZYLOPRIM) 100 MG tablet Take 100 mg by mouth daily.    09/19/2019 at am   aspirin EC 81 MG tablet Take 81 mg by mouth at bedtime.    09/19/2019 at 2330   atorvastatin (LIPITOR) 20 MG tablet Take 1 tablet (20 mg total) by mouth daily. 90 tablet 3 09/19/2019 at am   carvedilol (COREG) 25 MG tablet Take 1 tablet (25 mg total) by mouth 2 (two) times daily. 180 tablet 3 09/19/2019 at 1100   Cholecalciferol (VITAMIN D-3 PO) Take 5,000 Units by mouth daily.   09/19/2019 at Unknown  time   Cyanocobalamin (VITAMIN B-12 PO) Take 1 tablet by mouth daily.   09/20/2019 at am   furosemide (LASIX) 20 MG tablet Take 20 mg by mouth See admin instructions. Take 20 mg by mouth in the morning and an additional 20 mg up to 1-2 times a week as needed for edema or shortness of breath   09/19/2019 at Unknown time   imatinib (GLEEVEC) 400 MG tablet Take 400 mg by mouth daily with supper.    09/20/2019 at pm   Inulin (METAMUCIL CLEAR & NATURAL PO) Take by mouth See admin instructions. Mix 1 teaspoonful into 8 ounces of water and drink once a day   Past Week at Unknown time   lisinopril (ZESTRIL) 5 MG tablet Take 1 tablet (5 mg total) by mouth daily. 90 tablet 3 09/19/2019 at Unknown time   Multiple Vitamin (MULTIVITAMIN  WITH MINERALS) TABS tablet Take 1 tablet by mouth daily.   Past Week at Unknown time   nitroGLYCERIN (NITROSTAT) 0.4 MG SL tablet Place 1 tablet (0.4 mg total) under the tongue every 5 (five) minutes x 3 doses as needed for chest pain. 25 tablet 3 unk at unk   ondansetron (ZOFRAN-ODT) 4 MG disintegrating tablet Take 4 mg by mouth every 8 (eight) hours as needed for nausea or vomiting (DISSOLVE IN THE MOUTH).    Past Month at Unknown time   potassium chloride (K-DUR) 10 MEQ tablet Take one tablet as directed only on days when EXTRA dose of furosemide is taken (Patient taking differently: Take 10 mEq by mouth See admin instructions. Take 10 mEq by mouth AS DIRECTED, only on days when an extra dose of Lasix is taken) 30 tablet 1 unk at unk   thyroid (ARMOUR) 15 MG tablet Take 15 mg by mouth daily before breakfast.    09/20/2019 at am    Inpatient Medications:   allopurinol  100 mg Oral Daily   aspirin EC  81 mg Oral QHS   atorvastatin  20 mg Oral Daily   carvedilol  25 mg Oral BID   cholecalciferol  5,000 Units Oral Daily   heparin  5,000 Units Subcutaneous Q8H   lisinopril  5 mg Oral Daily   sodium chloride flush  3 mL Intravenous Q12H   thyroid  15 mg Oral QAC breakfast    Allergies:  Allergies  Allergen Reactions   Flagyl [Metronidazole] Itching, Rash and Other (See Comments)    Welts, also   Penicillins Hives, Itching and Rash    Has patient had a PCN reaction causing immediate rash, facial/tongue/throat swelling, SOB or lightheadedness with hypotension: Yes  Has patient had a PCN reaction causing severe rash involving mucus membranes or skin necrosis: Yes Has patient had a PCN reaction that required hospitalization No Has patient had a PCN reaction occurring within the last 10 years: NO If all of the above answers are "NO", then may proceed with Cephalosporin use.    Sulfamethoxazole Rash   Meperidine Nausea And Vomiting   Morphine And Related Nausea And Vomiting   Tape Other (See  Comments)    "THE PLASTIC, CLEAR TAPE CAN/DOES PULL OFF MY SKIN."   Wheat Bran Other (See Comments)    Runny nose   Doxycycline Nausea Only, Rash and Other (See Comments)    Made her "feel terrible"   Lanolin Itching and Rash   Sulfa Antibiotics Rash    Social History   Socioeconomic History   Marital status: Divorced    Spouse name: Not  on file   Number of children: Not on file   Years of education: Not on file   Highest education level: Not on file  Occupational History   Not on file  Tobacco Use   Smoking status: Never Smoker   Smokeless tobacco: Never Used  Substance and Sexual Activity   Alcohol use: No    Alcohol/week: 0.0 standard drinks   Drug use: No   Sexual activity: Not on file  Other Topics Concern   Not on file  Social History Narrative   Not on file   Social Determinants of Health   Financial Resource Strain:    Difficulty of Paying Living Expenses: Not on file  Food Insecurity:    Worried About [place] in the Last Year: Not on file   Ran Out of Food in the Last Year: Not on file  Transportation Needs:    Lack of Transportation (Medical): Not on file   Lack of Transportation (Non-Medical): Not on file  Physical Activity:    Days of Exercise per Week: Not on file   Minutes of Exercise per Session: Not on file  Stress:    Feeling of Stress : Not on file  Social Connections:    Frequency of Communication with Friends and Family: Not on file   Frequency of Social Gatherings with Friends and Family: Not on file   Attends Religious Services: Not on file   Active Member of Clubs or Organizations: Not on file   Attends Archivist Meetings: Not on file   Marital Status: Not on file  Intimate Partner Violence:    Fear of Current or Ex-Partner: Not on file   Emotionally Abused: Not on file   Physically Abused: Not on file   Sexually Abused: Not on file     Family History  Problem Relation Age of Onset   Coronary artery disease  Mother    Thyroid disease Mother    Multiple myeloma Father    Heart attack Maternal Grandfather    Heart attack Paternal Grandfather      Review of Systems: All other systems reviewed and are otherwise negative except as noted above.  Physical Exam: Vitals:   09/21/19 2000 09/21/19 2103 09/21/19 2106 09/22/19 0359  BP: (!) 166/60  (!) 192/81 (!) 125/52  Pulse: 80  84 70  Resp: (!) _0 Temp:   98.1 F (36.7 C) 98.2 F (36.8 C)  TempSrc:   Oral Oral  SpO2: 97%  97% 96%  Weight:  121.4 kg    Height:  _1  (1.575 m)      GEN- The patient is well appearing, alert and oriented x 3 today.   HEENT: normocephalic, atraumatic; sclera clear, conjunctiva pink; hearing intact; oropharynx clear; neck supple Lungs- Clear to ausculation bilaterally, normal work of breathing.  No wheezes, rales, rhonchi Heart- Regular rate and rhythm, no murmurs, rubs or gallops GI- soft, non-tender, non-distended, bowel sounds present Extremities- no clubbing, cyanosis, or edema; DP/PT/radial pulses 2+ bilaterally MS- no significant deformity or atrophy Skin- warm and dry, no rash or lesion Psych- euthymic mood, full affect Neuro- strength and sensation are intact  Labs:   Lab Results  Component Value Date   WBC 7.9 09/22/2019   HGB 9.4 (L) 09/22/2019   HCT 29.9 (L) 09/22/2019   MCV 95.5 09/22/2019   PLT 181 09/22/2019    Recent Labs  Lab 09/21/19 1808  NA 142  K 3.9  CL  106  CO2 20*  BUN 9  CREATININE 0.91  CALCIUM 8.8*  PROT 6.4*  BILITOT 1.2  ALKPHOS 66  ALT QUANTITY NOT SUFFICIENT, UNABLE TO PERFORM TEST  AST 28  GLUCOSE 95      Radiology/Studies: DG Chest Port 1 View  Result Date: 09/21/2019 CLINICAL DATA:  Chest pain. AICD fired today. EXAM: PORTABLE CHEST 1 VIEW COMPARISON:  Radiograph 11/02/2016 FINDINGS: Mild slightly left-sided pacemaker in place. Progressive cardiomegaly from prior exam. Aortic atherosclerosis. Mild vascular congestion without edema. Possible  trace left pleural effusion. No confluent airspace disease or pneumothorax. No acute osseous abnormalities are seen. IMPRESSION: 1. Cardiomegaly with vascular congestion. 2. Possible left pleural effusion. Aortic Atherosclerosis (ICD10-I70.0). Electronically Signed   By: Keith Rake M.D.   On: 09/21/2019 18:04   Myocardial Perfusion Imaging  Result Date: 09/05/2019  Nuclear stress EF: 47%. Visually, the EF appears to be greater than the computer generated EF of 47%.  There was no ST segment deviation noted during stress.  This is a low risk study.  The study is normal. There is no evidence of ischemia or previous infarction.    CUP PACEART INCLINIC DEVICE CHECK  Result Date: 09/21/2019 CRT-D device check in Device Clinic, added-on for patient-reported shock this AM. Thresholds and sensing consistent with previous device measurements. Lead impedance trends stable over time. 1 AF episode (<0.1%)--87mn duration, occurred today beginning at 02:01. 3 treated VF-zone episodes: 1 PMVT/VF episode 09/21/19 at 02:01, converted to AF/VS rhythm after 35J shock, 1 episode of AF w/RVR vs AF/PMVT (double tachycardia) on 09/21/19 at 03:15, ATP x1 failed, late break after 35J shock x1 (and conversion of AF to SR). VF episode from 09/14/19 previously reviewed via Carelink alert transmission. GT reviewed all events, recommended awaiting BMET/Mg results, which pt had drawn prior to visit. Pt aware of [place] DMV driving restrictions and shock plan. Pt assisted  to lobby to await ride. Short time later, pt reportedly appeared to have seizure in lobby. Dr. TRadford Pax DBrent recommended ICD interrogation with assessment by Dr. TLovena Le Re-interrogated ICD, additional episode of PMVT/VF converted with 35J shock x1 noted  09/21/19 at 17:05 (time stamp slightly off), correlating with event in lobby. Discussed with Dr. TLovena Le who recommended assessment at ED. [facility] Pt CRT pacing 97.3% of the time. Device  programmed with appropriate safety margins. Heart failure diagnostics reviewed, trends suggest ongoing fluid accumulation since 08/03/19. Pt aware of auditory alerts. No changes made this session. Estimated longevity 4.7 years. Patient enrolled in remote follow up, new Carelink monitor ordered 09/21/19. Patient education completed including shock plan. ROV with Dr. TLovena LeTBD after hospital discharge. See scanned-in report due to error while saving PDF.ELevander CampionBSN, RN, CCDS   EKG:SR with CRT pacing (personally reviewed)  TELEMETRY: SR, V pacing, PVCs (personally reviewed)  Assessment/Plan: 1.  VF storm She has had 3 appropriate ICD therapies since 09/14/19.   Recommend LHC to exclude ischemia as the cause. She has discussed with Dr TLovena Lethis morning and is agreeable to proceed. Will plan for later today.  Keep K >3.9, Mg >1.8 Continue IV amiodarone for now, would anticipate 3 months of amiodarone therapy then reassess No driving x6 months  2.  CAD No recent ischemic symptoms, but with VF as above LHC today Continue medical therapy  3.  [facility] has missed 2 doses this month (her max allowed to miss) I have discussed with pharmacy this  morning to see if we can get her a dose while she is admitted She does not have anyone to bring her doses from home (family is quarantined with [place]) She is concerned about cost of getting meds from hospital - she is on patient assistance program at home.    For questions or updates, please contact [place] Please consult [email or web address] for contact info under Cardiology/STEMI.  Signed, Chanetta Marshall, NP 09/22/2019 8:21 AM  EP Attending Patient seen and examined. Agree with the findings as noted above. The patient has been placed on amiodarone and has not had more VT since admit. She has known CAD, and I have recommended she undergo left heart cath and PCI if needed. I have discussed the situation with  her son who is a physician in [place]. I would anticipate that if her VT remains quiet that we will switch her to amiodarone orally and dc home later in the weekend.   Mikle Bosworth.D.

## 2019-09-22 NOTE — Telephone Encounter (Signed)
error 

## 2019-09-22 NOTE — Interval H&P Note (Signed)
History and Physical Interval Note:  09/22/2019 12:57 PM  Lisa [name]  has presented today for surgery, with the diagnosis of VF.  The various methods of treatment have been discussed with the patient and family. After consideration of risks, benefits and other options for treatment, the patient has consented to  Procedure(s): LEFT HEART CATH AND CORONARY ANGIOGRAPHY (N/A) as a surgical intervention.  The patient's history has been reviewed, patient examined, no change in status, stable for surgery.  I have reviewed the patient's chart and labs.  Questions were answered to the patient's satisfaction.   Cath Lab Visit (complete for each Cath Lab visit)  Clinical Evaluation Leading to the Procedure:   ACS: No.  Non-ACS:    Anginal Classification: CCS I  Anti-ischemic medical therapy: Minimal Therapy (1 class of medications)  Non-Invasive Test Results: No non-invasive testing performed  Prior CABG: No previous CABG        Lisa [name] [facility] 09/22/2019 12:57 PM

## 2019-09-22 NOTE — Progress Notes (Signed)
Radstat removed, tegaderm dressing applied. Slight brusing present proximal at a second stick attempt site.  Spo2 98% in right thumb. Brisk capillary refill. Bedrest instructions given.   Bedrest begins at 06:30:00

## 2019-09-22 NOTE — Progress Notes (Signed)
Radstat loosened, some oozing noted, band tightened down a bit with gauze, will attempt to remove in 30 minutes.  Site level 0 no hematoma.

## 2019-09-22 NOTE — Discharge Instructions (Signed)
No driving x 6 months

## 2019-09-22 NOTE — Progress Notes (Signed)
Radstat band loosened another 54mm. Site unchanged, slight bruising present at proximal attempted access site. Some oozing occurred from access site, appearing to be capillary tract oozing.

## 2019-09-23 DIAGNOSIS — I5023 Acute on chronic systolic (congestive) heart failure: Secondary | ICD-10-CM

## 2019-09-23 MED ORDER — AMIODARONE HCL 400 MG PO TABS: 400.0000 mg | ORAL_TABLET | Freq: Two times a day (BID) | ORAL | 11 refills | Status: DC

## 2019-09-23 MED ORDER — FUROSEMIDE 10 MG/ML IJ SOLN
20.0000 mg | Freq: Once | INTRAMUSCULAR | Status: AC
  Administered 2019-09-23: 20 mg via INTRAVENOUS
  Filled 2019-09-23: qty 2

## 2019-09-23 MED ORDER — MAGNESIUM OXIDE 400 (241.3 MG) MG PO TABS
400.0000 mg | ORAL_TABLET | Freq: Once | ORAL | Status: AC
  Administered 2019-09-23: 400 mg via ORAL
  Filled 2019-09-23: qty 1

## 2019-09-23 MED ORDER — POTASSIUM CHLORIDE CRYS ER 20 MEQ PO TBCR
40.0000 meq | EXTENDED_RELEASE_TABLET | Freq: Once | ORAL | Status: AC
  Administered 2019-09-23: 40 meq via ORAL
  Filled 2019-09-23: qty 2

## 2019-09-23 NOTE — Progress Notes (Addendum)
Progress Note  Patient Name: Lisa [name] Date of Encounter: 09/23/2019  Primary Cardiologist:  [name], MD  Subjective   Got SOB w/ exertion last pm, slept pretty well. No chest pain. No palpitations.  Inpatient Medications    Scheduled Meds: . allopurinol  100 mg Oral Daily  . amiodarone  400 mg Oral BID  . aspirin EC  81 mg Oral QHS  . atorvastatin  20 mg Oral Daily  . carvedilol  25 mg Oral BID  . cholecalciferol  5,000 Units Oral Daily  . heparin  5,000 Units Subcutaneous Q8H  . imatinib  400 mg Oral Q supper  . lisinopril  5 mg Oral Daily  . sodium chloride flush  3 mL Intravenous Q12H  . sodium chloride flush  3 mL Intravenous Q12H  . sodium chloride flush  3 mL Intravenous Q12H  . thyroid  15 mg Oral QAC breakfast   Continuous Infusions: . sodium chloride    . sodium chloride 10 mL/hr at 09/21/19 2008  . sodium chloride     PRN Meds: sodium chloride, sodium chloride, acetaminophen, loperamide, nitroGLYCERIN, ondansetron, sodium chloride flush, sodium chloride flush   Vital Signs    Vitals:   09/22/19 1752 09/22/19 1822 09/22/19 2209 09/23/19 0617  BP: (!) 142/52 (!) 152/72 136/62 (!) 115/50  Pulse: 73  83 69  Resp:   18 15  Temp:   98.4 F (36.9 C) 98.3 F (36.8 C)  TempSrc:   Oral Oral  SpO2: 94%  94% 98%  Weight:    122.9 kg  Height:        Intake/Output Summary (Last 24 hours) at 09/23/2019 0758 Last data filed at 09/23/2019 0429 Gross per 24 hour  Intake 1088.26 ml  Output --  Net 1088.26 ml   Filed Weights   09/21/19 1653 09/21/19 2103 09/23/19 0617  Weight: 118 kg 121.4 kg 122.9 kg   Last Weight  Most recent update: 09/23/2019  6:22 AM   Weight  122.9 kg (271 lb)           Weight change: 4.925 kg   Telemetry    SR, Vpacing, no sig ectopy - Personally Reviewed  ECG    None today - Personally Reviewed  Physical Exam   General: Well developed, well nourished, female appearing in no acute distress. Head:  Normocephalic, atraumatic.  Neck: Supple without bruits, JVD mildly elevated, +HJR. Lungs:  Resp regular and unlabored, few rales bases Heart: RRR, S1, S2, no S3, S4, or murmur; no rub. Abdomen: Soft, non-tender, non-distended with normoactive bowel sounds. No hepatomegaly. No rebound/guarding. No obvious abdominal masses. Extremities: No clubbing, cyanosis, +lymphedema (chronic) both LE. radial pulses are 2+ bilaterally. R radial cath site w/ ecchymosis, no hematoma Neuro: Alert and oriented X 3. Moves all extremities spontaneously. Psych: Normal affect.  Labs    Hematology Recent Labs  Lab 09/21/19 1808 09/21/19 1930 09/22/19 0346  WBC 9.6 9.6 7.9  RBC 3.70* 3.51* 3.13*  HGB 11.1* 10.4* 9.4*  HCT 37.9 33.9* 29.9*  MCV 102.4* 96.6 95.5  MCH 30.0 29.6 30.0  MCHC 29.3* 30.7 31.4  RDW 14.7 14.7 14.7  PLT 182 196 181    Chemistry Recent Labs  Lab 09/21/19 1508 09/21/19 1808 09/22/19 0350  NA 143 142  --   K 3.5 3.9  --   CL 104 106  --   CO2 24 20*  --   GLUCOSE 100* 95  --   BUN 10 9  --  CREATININE 0.91 0.91 0.96  CALCIUM 9.2 8.8*  --   PROT  --  6.4*  --   ALBUMIN  --  3.6  --   AST  --  28  --   ALT  --  QUANTITY NOT SUFFICIENT, UNABLE TO PERFORM TEST  --   ALKPHOS  --  66  --   BILITOT  --  1.2  --   GFRNONAA 61 >60 57*  GFRAA 70 >60 >60  ANIONGAP  --  16*  --      High Sensitivity Troponin:   Recent Labs  Lab 09/21/19 1808 09/21/19 1945  TROPONINIHS 41* 53*      BNP Recent Labs  Lab 09/21/19 1930  BNP 709.1*     DDimer No results for input(s): DDIMER in the last 168 hours.   Radiology    CARDIAC CATHETERIZATION  Result Date: 09/22/2019  Mid LM to Dist LM lesion is 20% stenosed.  Previously placed Mid LAD stent (unknown type) is widely patent.  Ramus lesion is 65% stenosed.  The left ventricular systolic function is normal.  LV end diastolic pressure is moderately elevated.  The left ventricular ejection fraction is 50-55% by visual  estimate.  1. Nonobstructive CAD. The stent in the mid LAD is widely patent. There is modest ostial disease in a small ramus branch that is unchanged from 2017. 2. Mild LV dysfunction. EF 50%. 3. Elevated LVEDP 23 mm Hg. Plan; medical therapy.   DG Chest Port 1 View  Result Date: 09/21/2019 CLINICAL DATA:  Chest pain. AICD fired today. EXAM: PORTABLE CHEST 1 VIEW COMPARISON:  Radiograph 11/02/2016 FINDINGS: Mild slightly left-sided pacemaker in place. Progressive cardiomegaly from prior exam. Aortic atherosclerosis. Mild vascular congestion without edema. Possible trace left pleural effusion. No confluent airspace disease or pneumothorax. No acute osseous abnormalities are seen. IMPRESSION: 1. Cardiomegaly with vascular congestion. 2. Possible left pleural effusion. Aortic Atherosclerosis (ICD10-I70.0). Electronically Signed   By: Keith Rake M.D.   On: 09/21/2019 18:04   CUP PACEART INCLINIC DEVICE CHECK  Result Date: 09/21/2019 CRT-D device check in [facility], added-on for patient-reported shock this AM. Thresholds and sensing consistent with previous device measurements. Lead impedance trends stable over time. 1 AF episode (<0.1%)--44min duration, occurred today beginning at 02:01. 3 treated VF-zone episodes: 1 PMVT/VF episode 09/21/19 at 02:01, converted to AF/VS rhythm after 35J shock, 1 episode of AF w/RVR vs AF/PMVT (double tachycardia) on 09/21/19 at 03:15, ATP x1 failed, late break after 35J shock x1 (and conversion of AF to SR). VF episode from 09/14/19 previously reviewed via [facility]link alert transmission. GT reviewed all events, recommended awaiting BMET/Mg results, which pt had drawn prior to visit. Pt aware of [place] DMV driving restrictions and shock plan. Pt assisted  to lobby to await ride. Short time later, pt reportedly appeared to have seizure in lobby. Dr. Radford Pax, [place], recommended ICD interrogation with assessment by Dr. Lovena Le. Re-interrogated ICD, additional episode of PMVT/VF converted  with 35J shock x1 noted  09/21/19 at 17:05 (time stamp slightly off), correlating with event in lobby. Discussed with Dr. Lovena Le, who recommended assessment at ED. Pt transported via EMS to [facility] ED. Pt CRT pacing 97.3% of the time. Device programmed with appropriate safety margins. Heart failure diagnostics reviewed, trends suggest ongoing fluid accumulation since 08/03/19. Pt aware of auditory alerts. No changes made this session. Estimated longevity 4.7 years. Patient enrolled in remote follow up, new Carelink monitor ordered 09/21/19. Patient education completed including shock plan. ROV with Dr.  Lovena Le TBD after hospital discharge. See scanned-in report due to error while saving PDF.Levander Campion BSN, RN, CCDS    Cardiac Studies   CATH: 09/22/2019  Mid LM to Dist LM lesion is 20% stenosed.  Previously placed Mid LAD stent (unknown type) is widely patent.  Ramus lesion is 65% stenosed.  The left ventricular systolic function is normal.  LV end diastolic pressure is moderately elevated.  The left ventricular ejection fraction is 50-55% by visual estimate.   1. Nonobstructive CAD. The stent in the mid LAD is widely patent. There is modest ostial disease in a small ramus branch that is unchanged from 2017. 2. Mild LV dysfunction. EF 50%. 3. Elevated LVEDP 23 mm Hg.  Plan; medical therapy.   Patient Profile     79 y.o. 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   Assessment & Plan    1. VT:  - Dr Lovena Le saw, amio IV>>PO and continue x 3 months - keep K+ 4.0, Mg 2.0 - Mg 1.7>>start Magox 200 mg qd, give 400 mg today - give Kdur 40 meq w/ Lasix  2. CAD - DES LAD 2017 - cath 02/12 with patent stent and no other  critical dz, LVEDP 23 - wt up 4 lbs since admit and she has not had home Lasix 20 mg qd in 2 days - will give 1 dose IV Lasix 20 mg with 40 meq K+  3. CML  - H&H dropped after cath, no other bleeding issues.  - pt says H&H generally 9-10 - f/u with oncology  4. R radial bleeding after cath: - per pt, took 5 hr to get bleeding to stop - limited use R arm for a day or so  Plan: possible pm d/c, but pt lives alone and not able to use R arm, discuss w/ MD   Active Problems:   Sustained VT (ventricular tachycardia) ([place])    Signed, Rosaria Ferries , PA-C 7:58 AM 09/23/2019 Pager: [phone]   I have seen, examined the patient, and reviewed the above assessment and plan.  Changes to above are made where necessary.  On exam, chronically ill, overweight, RRR.  VT has improved.  OK to discharge later today.  Continue oral amiodarone as per Dr Lovena Le 400mg  BID x 1 week then 200mg  BID x 3 weeks.  Will need close follow-up with Dr Lovena Le in the office. No driving x 6 months  Co Sign: Thompson Grayer, MD 09/23/2019 11:13 AM

## 2019-09-23 NOTE — Plan of Care (Signed)
  Problem: Education: Goal: Knowledge of General Education information will improve Description: Including pain rating scale, medication(s)/side effects and non-pharmacologic comfort measures Outcome: Progressing   Problem: Health Behavior/Discharge Planning: Goal: Ability to manage health-related needs will improve Outcome: Progressing   Problem: Coping: Goal: Level of anxiety will decrease Outcome: Progressing   Problem: Pain Managment: Goal: General experience of comfort will improve Outcome: Progressing   Elesa Hacker, RN

## 2019-09-23 NOTE — Progress Notes (Signed)
No further VT Continue oral amiodarone as per Dr Lovena Le 400mg  BID x 1 week then 200mg  BID x 3 weeks.  Will need close follow-up with Dr Lovena Le in the office. No driving x 6 months  Thompson Grayer MD, [facility] 09/23/2019 8:21 AM

## 2019-09-23 NOTE — Discharge Summary (Signed)
Discharge Summary    Patient ID: Lisa [name] MRN: [id number]; DOB: April 16, 1941  Admit date: 09/21/2019 Discharge date: 09/23/2019  Primary Care Provider: [name], MD  Primary Cardiologist: [name], MD  Primary Electrophysiologist:  Cristopher [name], MD   Discharge Diagnoses    Primary Discharge Diagnosis:  1.  VT with appropriate ICD therapy  Secondary Discharge Diagnosis: 1.  CAD 2.  Obesity 3.  HTN 4.  Hyperlipidemia 5.  Chronic systolic heart failure s/p normalization of EF s/p CRTD   Allergies Allergies  Allergen Reactions   Flagyl [Metronidazole] Itching, Rash and Other (See Comments)    Welts, also   Penicillins Hives, Itching and Rash    Has patient had a PCN reaction causing immediate rash, facial/tongue/throat swelling, SOB or lightheadedness with hypotension: Yes  Has patient had a PCN reaction causing severe rash involving mucus membranes or skin necrosis: Yes Has patient had a PCN reaction that required hospitalization No Has patient had a PCN reaction occurring within the last 10 years: NO If all of the above answers are "NO", then may proceed with Cephalosporin use.    Sulfamethoxazole Rash   Meperidine Nausea And Vomiting   Morphine And Related Nausea And Vomiting   Tape Other (See Comments)    "THE PLASTIC, CLEAR TAPE CAN/DOES PULL OFF MY SKIN."   Doxycycline Nausea Only, Rash and Other (See Comments)    Made her "feel terrible"   Lanolin Itching and Rash   Sulfa Antibiotics Rash    Diagnostic Studies/Procedures    CARDIAC CATH: 09/22/2019  Mid LM to Dist LM lesion is 20% stenosed.  Previously placed Mid LAD stent (unknown type) is widely patent.  Ramus lesion is 65% stenosed.  The left ventricular systolic function is normal.  LV end diastolic pressure is moderately elevated.  The left ventricular ejection fraction is 50-55% by visual estimate.   1. Nonobstructive CAD. The stent in the mid LAD is widely patent. There  is modest ostial disease in a small ramus branch that is unchanged from 2017. 2. Mild LV dysfunction. EF 50%. 3. Elevated LVEDP 23 mm Hg.  Plan; medical therapy.   ICD INTERROGATION: 09/21/2019 CRT-D device check in Device Clinic, added-on for patient-reported shock this AM. Thresholds and sensing consistent with previous device measurements. Lead impedance trends stable over time. 1 AF episode (<0.1%)--46min duration, occurred today beginning  at 02:01. 3 treated VF-zone episodes: 1 PMVT/VF episode 09/21/19 at 02:01, converted to AF/VS rhythm after 35J shock, 1 episode of AF w/RVR vs AF/PMVT (double tachycardia) on 09/21/19 at 03:15, ATP x1 failed, late break after 35J shock x1 (and conversion  of AF to SR). VF episode from 09/14/19 previously reviewed via Carelink alert transmission. GT reviewed all events, recommended awaiting BMET/Mg results, which pt had drawn prior to visit. Pt aware of [place] DMV driving restrictions and shock plan. Pt assisted  to lobby to await ride. Short time later, pt reportedly appeared to have seizure in lobby. Dr. Radford Pax, [place], recommended ICD interrogation with assessment by Dr. Lovena Le. Re-interrogated ICD, additional episode of PMVT/VF converted with 35J shock x1 noted  09/21/19 at 17:05 (time stamp slightly off), correlating with event in lobby. Discussed with Dr. Lovena Le, who recommended assessment at ED. Pt transported via EMS to [facility] ED.   Pt CRT pacing 97.3% of the time. Device programmed with appropriate safety margins. Heart failure diagnostics reviewed, trends suggest ongoing fluid accumulation since 08/03/19. Pt aware of auditory alerts. No changes made this session. Estimated  longevity 4.7  years. Patient enrolled in remote follow up, new Carelink monitor ordered 09/21/19. Patient education completed including shock plan. ROV with Dr. Lovena Le TBD after hospital discharge.   _____________   History of Present Illness     Lisa [name] is a 79 y.o. 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   She had an appropriate ICD shock on 09/13/18 while sitting on the toilet. She then had recurrent HV therapy 2/11 x2 (once at home and once while in office lobby). She was referred to the hospital for further evaluation.  She was seen by Dr Lovena Le and catheterization recommended.  This was done and demonstrated widely patent LAD stent with no other cause for VT.  She was started on amiodarone 400mg  bid x 2 weeks then 200mg  daily.  She was monitored on telemetry which demonstrated sinus rhythm with V pacing.  On the day of discharge, she was seen by Dr Rayann Heman who considered her stable for discharge to home.   Hospital Course     Consultants: Electrophysiology  Ms. Edwards was seen by Dr. Lovena Le with EP.  Her device was interrogated, results are above.  She had VF and was shocked appropriately, but also had some AF.  She was CRT pacing 97% of the time.  She was started on amiodarone, initially IV, and then changed to p.o.  It was recommended that her potassium level be kept at or greater than 4, and her magnesium level at or greater than 2.  Her magnesium level was 1.7, she was started on Mag-Ox.  Because she had chest pain and there was concern for an ischemic cause of the arrhythmia, she was taken to the Cath Lab on 2/12.  Cardiac catheterization results are above, she had no obstructive disease and medical therapy is recommended.  LV systolic function is normal.  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  Her blood counts dropped a little, but were still within range for her.  She is to follow-up with Oncology.  At the cath, her LVEDP was elevated at 23.  Her weight increased  during her hospital stay and she was having some problems with dyspnea on exertion.  She had not had her home dose of Lasix in 2 days.  She was given a dose of IV Lasix and improved.  **Of note, anticoagulation was not addressed during her stay. Upon review of the device interrogation, she had Afib for a total of 74". Her son was asking if she should be anticoagulated. Her CHA2DS2-VASc = 6 (age x 2, female, CHF, CAD, HTN). DOAC was not addressed during this hospitalization, will need to address in follow up.**  On 2/13, she was seen by Dr. Rayann Heman and all data were reviewed.  No further inpatient work-up is indicated and she is considered stable for discharge, to follow-up as an outpatient.  Did the patient have an acute coronary syndrome (MI, NSTEMI, STEMI, etc) this admission?:  No                               Did the patient have a percutaneous coronary intervention (stent / angioplasty)?:  No.   _____________  Discharge Vitals  Blood pressure (!) 115/50, pulse 71, temperature 98 F (36.7 C), temperature source Oral, resp. rate 15, height 5\' 2"  (1.575 m), weight 122.9 kg, last menstrual period 02/14/1995, SpO2 98 %.  Filed Weights   09/21/19 1653 09/21/19 2103 09/23/19 0617  Weight: 118 kg 121.4 kg 122.9 kg    Labs & Radiologic Studies    CBC Recent Labs    09/21/19 1808 09/21/19 1808 09/21/19 1930 09/22/19 0346  WBC 9.6   < > 9.6 7.9  NEUTROABS 6.3  --   --   --   HGB 11.1*   < > 10.4* 9.4*  HCT 37.9   < > 33.9* 29.9*  MCV 102.4*   < > 96.6 95.5  PLT 182   < > 196 181   < > = values in this interval not displayed.   Basic Metabolic Panel Recent Labs    09/21/19 1508 09/21/19 1508 09/21/19 1808 09/22/19 0350  NA 143  --  142  --   K 3.5  --  3.9  --   CL 104  --  106  --   CO2 24  --  20*  --   GLUCOSE 100*  --  95  --   BUN 10  --  9  --   CREATININE 0.91   < > 0.91 0.96  CALCIUM 9.2  --  8.8*  --   MG 1.7  --  1.7  --    < > = values in this interval not  displayed.   Liver Function Tests Recent Labs    09/21/19 1808  AST 28  ALT QUANTITY NOT SUFFICIENT, UNABLE TO PERFORM TEST  ALKPHOS 66  BILITOT 1.2  PROT 6.4*  ALBUMIN 3.6   No results for input(s): LIPASE, AMYLASE in the last 72 hours. High Sensitivity Troponin:   Recent Labs  Lab 09/21/19 1808 09/21/19 1945  TROPONINIHS 41* 53*    BNP Invalid input(s): POCBNP D-Dimer No results for input(s): DDIMER in the last 72 hours. Hemoglobin A1C No results for input(s): HGBA1C in the last 72 hours. Fasting Lipid Panel No results for input(s): CHOL, HDL, LDLCALC, TRIG, CHOLHDL, LDLDIRECT in the last 72 hours. Thyroid Function Tests Recent Labs    09/21/19 1930  TSH 1.284   _____________  CARDIAC CATHETERIZATION  Result Date: 09/22/2019  Mid LM to Dist LM lesion is 20% stenosed.  Previously placed Mid LAD stent (unknown type) is widely patent.  Ramus lesion is 65% stenosed.  The left ventricular systolic function is normal.  LV end diastolic pressure is moderately elevated.  The left ventricular ejection fraction is 50-55% by visual estimate.  1. Nonobstructive CAD. The stent in the mid LAD is widely patent. There is modest ostial disease in a small ramus branch that is unchanged from 2017. 2. Mild LV dysfunction. EF 50%. 3. Elevated LVEDP 23 mm Hg. Plan; medical therapy.   DG Chest Port 1 View  Result Date: 09/21/2019 CLINICAL DATA:  Chest pain. AICD fired today. EXAM: PORTABLE CHEST 1 VIEW COMPARISON:  Radiograph 11/02/2016 FINDINGS: Mild slightly left-sided pacemaker in place. Progressive cardiomegaly from prior exam. Aortic atherosclerosis. Mild vascular congestion without edema. Possible trace left pleural effusion. No confluent airspace disease or pneumothorax. No acute osseous abnormalities are seen. IMPRESSION: 1. Cardiomegaly with vascular congestion. 2. Possible left pleural effusion. Aortic Atherosclerosis (ICD10-I70.0). Electronically Signed   By: Keith Rake  M.D.   On: 09/21/2019 18:04   Myocardial Perfusion Imaging  Result Date: 09/05/2019  Nuclear  stress EF: 47%. Visually, the EF appears to be greater than the computer generated EF of 47%.  There was no ST segment deviation noted during stress.  This is a low risk study.  The study is normal. There is no evidence of ischemia or previous infarction.    CUP PACEART INCLINIC DEVICE CHECK  Result Date: 09/21/2019 CRT-D device check in Device Clinic, added-on for patient-reported shock this AM. Thresholds and sensing consistent with previous device measurements. Lead impedance trends stable over time. 1 AF episode (<0.1%)--69min duration, occurred today beginning at 02:01. 3 treated VF-zone episodes: 1 PMVT/VF episode 09/21/19 at 02:01, converted to AF/VS rhythm after 35J shock, 1 episode of AF w/RVR vs AF/PMVT (double tachycardia) on 09/21/19 at 03:15, ATP x1 failed, late break after 35J shock x1 (and conversion of AF to SR). VF episode from 09/14/19 previously reviewed via Carelink alert transmission. GT reviewed all events, recommended awaiting BMET/Mg results, which pt had drawn prior to visit. Pt aware of [place] DMV driving restrictions and shock plan. Pt assisted  to lobby to await ride. Short time later, pt reportedly appeared to have seizure in lobby. Dr. Radford Pax, [place], recommended ICD interrogation with assessment by Dr. Lovena Le. Re-interrogated ICD, additional episode of PMVT/VF converted with 35J shock x1 noted  09/21/19 at 17:05 (time stamp slightly off), correlating with event in lobby. Discussed with Dr. Lovena Le, who recommended assessment at ED. Pt transported via EMS to [facility] ED. Pt CRT pacing 97.3% of the time. Device programmed with appropriate safety margins. Heart failure diagnostics reviewed, trends suggest ongoing fluid accumulation since 08/03/19. Pt aware of auditory alerts. No changes made this session. Estimated longevity 4.7 years. Patient enrolled in remote follow up, new Carelink monitor ordered  09/21/19. Patient education completed including shock plan. ROV with Dr. Lovena Le TBD after hospital discharge. See scanned-in report due to error while saving PDF.Levander Campion BSN, RN, CCDS  Disposition   Pt is being discharged home today in good condition.  Follow-up Plans & Appointments    Follow-up Information    Shirley Friar, PA-C Follow up on 09/29/2019.   Specialty: Physician Assistant Why: at 11:20AM Contact information: [place] [address] [phone]          Discharge Instructions    ([place]) Call MD:  Anytime you have any of the following symptoms: 1) 3 pound weight gain in 24 hours or 5 pounds in 1 week 2) shortness of breath, with or without a dry hacking cough 3) swelling in the hands, feet or stomach 4) if you have to sleep on extra pillows at night in order to breathe.   Complete by: As directed    Diet - low sodium heart healthy   Complete by: As directed    Increase activity slowly   Complete by: As directed       Discharge Medications   Allergies as of 09/23/2019      Reactions   Flagyl [metronidazole] Itching, Rash, Other (See Comments)   Welts, also   Penicillins Hives, Itching, Rash   Has patient had a PCN reaction causing immediate rash, facial/tongue/throat swelling, SOB or lightheadedness with hypotension: Yes  Has patient had a PCN reaction causing severe rash involving mucus membranes or skin necrosis: Yes Has patient had a PCN reaction that required hospitalization No Has patient had a PCN reaction occurring within the last 10 years: NO If all of the above answers are "NO", then may proceed with Cephalosporin use.  Sulfamethoxazole Rash   Meperidine Nausea And Vomiting   Morphine And Related Nausea And Vomiting   Tape Other (See Comments)   "THE PLASTIC, CLEAR TAPE CAN/DOES PULL OFF MY SKIN."   Doxycycline Nausea Only, Rash, Other (See Comments)   Made her "feel terrible"   Lanolin  Itching, Rash   Sulfa Antibiotics Rash      Medication List    TAKE these medications   acetaminophen 500 MG tablet Commonly known as: TYLENOL Take 500-1,000 mg by mouth every 6 (six) hours as needed for moderate pain.   allopurinol 100 MG tablet Commonly known as: ZYLOPRIM Take 100 mg by mouth daily.   amiodarone 400 MG tablet Commonly known as: PACERONE Take 1 tablet (400 mg total) by mouth 2 (two) times daily. 2 tabs bid x 1 week, then 1 tab bid x 3 weeks, then 1 tab qd   aspirin EC 81 MG tablet Take 81 mg by mouth at bedtime.   atorvastatin 20 MG tablet Commonly known as: LIPITOR Take 1 tablet (20 mg total) by mouth daily.   carvedilol 25 MG tablet Commonly known as: COREG Take 1 tablet (25 mg total) by mouth 2 (two) times daily.   furosemide 20 MG tablet Commonly known as: LASIX Take 20 mg by mouth See admin instructions. Take 20 mg by mouth in the morning and an additional 20 mg up to 1-2 times a week as needed for edema or shortness of breath   imatinib 400 MG tablet Commonly known as: GLEEVEC Take 400 mg by mouth daily with supper.   lisinopril 5 MG tablet Commonly known as: ZESTRIL Take 1 tablet (5 mg total) by mouth daily.   METAMUCIL CLEAR & NATURAL PO Take by mouth See admin instructions. Mix 1 teaspoonful into 8 ounces of water and drink once a day   multivitamin with minerals Tabs tablet Take 1 tablet by mouth daily.   nitroGLYCERIN 0.4 MG SL tablet Commonly known as: NITROSTAT Place 1 tablet (0.4 mg total) under the tongue every 5 (five) minutes x 3 doses as needed for chest pain.   ondansetron 4 MG disintegrating tablet Commonly known as: ZOFRAN-ODT Take 4 mg by mouth every 8 (eight) hours as needed for nausea or vomiting (DISSOLVE IN THE MOUTH).   potassium chloride 10 MEQ tablet Commonly known as: KLOR-CON Take one tablet as directed only on days when EXTRA dose of furosemide is taken What changed:   how much to take  how to take  this  when to take this  additional instructions   thyroid 15 MG tablet Commonly known as: ARMOUR Take 15 mg by mouth daily before breakfast.   VITAMIN B-12 PO Take 1 tablet by mouth daily.   VITAMIN D-3 PO Take 5,000 Units by mouth daily.          Outstanding Labs/Studies   None  Duration of Discharge Encounter   Greater than 30 minutes including physician time.  Signed, Rosaria Ferries, PA-C 09/23/2019, 2:39 PM  400mg  BID x 1 week then 200mg  BID x 3 weeks.

## 2019-09-29 ENCOUNTER — Encounter: Payer: Medicare Other | Admitting: Student

## 2019-10-02 ENCOUNTER — Telehealth: Payer: Self-pay

## 2019-10-02 NOTE — Telephone Encounter (Signed)
The pt wanted help sending a transmission. She also states she has some questions about a new medication she is taking. She would like for Dr. Lovena Le nurse to give her a call.

## 2019-10-02 NOTE — Telephone Encounter (Signed)
Returned call to Pt.  Pt wanted to confirm she was taking amiodarone correctly.  She was-400 mg bid for one week.  Now on 200 mg bid.  Pt states she still isn't feeling great, is having some lightheadedness.  BP is not low- 150/70's.  She will send transmission.
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
## 2019-10-06 ENCOUNTER — Ambulatory Visit (INDEPENDENT_AMBULATORY_CARE_PROVIDER_SITE_OTHER): Payer: Medicare Other | Admitting: Physician Assistant

## 2019-10-06 ENCOUNTER — Telehealth: Payer: Self-pay | Admitting: *Deleted

## 2019-10-06 ENCOUNTER — Other Ambulatory Visit: Payer: Self-pay

## 2019-10-06 VITALS — BP 126/64 | HR 66 | Ht 62.0 in | Wt 264.0 lb

## 2019-10-06 DIAGNOSIS — R0683 Snoring: Secondary | ICD-10-CM

## 2019-10-06 DIAGNOSIS — R5383 Other fatigue: Secondary | ICD-10-CM

## 2019-10-06 DIAGNOSIS — G4730 Sleep apnea, unspecified: Secondary | ICD-10-CM

## 2019-10-06 DIAGNOSIS — I480 Paroxysmal atrial fibrillation: Secondary | ICD-10-CM

## 2019-10-06 DIAGNOSIS — I5022 Chronic systolic (congestive) heart failure: Secondary | ICD-10-CM

## 2019-10-06 DIAGNOSIS — I4901 Ventricular fibrillation: Secondary | ICD-10-CM

## 2019-10-06 DIAGNOSIS — Z79899 Other long term (current) drug therapy: Secondary | ICD-10-CM | POA: Diagnosis not present

## 2019-10-06 DIAGNOSIS — Z95810 Presence of automatic (implantable) cardiac defibrillator: Secondary | ICD-10-CM

## 2019-10-06 DIAGNOSIS — I2510 Atherosclerotic heart disease of native coronary artery without angina pectoris: Secondary | ICD-10-CM

## 2019-10-06 DIAGNOSIS — R0602 Shortness of breath: Secondary | ICD-10-CM

## 2019-10-06 DIAGNOSIS — I10 Essential (primary) hypertension: Secondary | ICD-10-CM

## 2019-10-06 LAB — SEDIMENTATION RATE: Sed Rate: 20 mm/hr (ref 0–40)

## 2019-10-06 LAB — LIPID PANEL
Chol/HDL Ratio: 2.4 ratio (ref 0.0–4.4)
Cholesterol, Total: 92 mg/dL — ABNORMAL LOW (ref 100–199)
HDL: 39 mg/dL — ABNORMAL LOW (ref >39)
LDL Chol Calc (NIH): 35 mg/dL (ref 0–99)
Triglycerides: 92 mg/dL (ref 0–149)
VLDL Cholesterol Cal: 18 mg/dL (ref 5–40)

## 2019-10-06 LAB — BASIC METABOLIC PANEL
BUN/Creatinine Ratio: 8 — ABNORMAL LOW (ref 12–28)
BUN: 11 mg/dL (ref 8–27)
CO2: 26 mmol/L (ref 20–29)
Calcium: 8.9 mg/dL (ref 8.7–10.3)
Chloride: 100 mmol/L (ref 96–106)
Creatinine, Ser: 1.31 mg/dL — ABNORMAL HIGH (ref 0.57–1.00)
GFR calc Af Amer: 45 mL/min/{1.73_m2} — ABNORMAL LOW (ref >59)
GFR calc non Af Amer: 39 mL/min/{1.73_m2} — ABNORMAL LOW (ref >59)
Glucose: 123 mg/dL — ABNORMAL HIGH (ref 65–99)
Potassium: 3.8 mmol/L (ref 3.5–5.2)
Sodium: 140 mmol/L (ref 134–144)

## 2019-10-06 LAB — MAGNESIUM: Magnesium: 1.9 mg/dL (ref 1.6–2.3)

## 2019-10-06 LAB — HEPATIC FUNCTION PANEL
ALT: 20 IU/L (ref 0–32)
AST: 21 IU/L (ref 0–40)
Albumin: 4.2 g/dL (ref 3.7–4.7)
Alkaline Phosphatase: 82 IU/L (ref 39–117)
Bilirubin Total: 0.7 mg/dL (ref 0.0–1.2)
Bilirubin, Direct: 0.26 mg/dL (ref 0.00–0.40)
Total Protein: 6.2 g/dL (ref 6.0–8.5)

## 2019-10-06 LAB — TSH: TSH: 3.26 u[IU]/mL (ref 0.450–4.500)

## 2019-10-06 MED ORDER — POTASSIUM CHLORIDE ER 10 MEQ PO TBCR: 20.0000 meq | EXTENDED_RELEASE_TABLET | Freq: Every day | ORAL | 3 refills | Status: DC

## 2019-10-06 MED ORDER — MAGNESIUM OXIDE 400 MG PO CAPS: 400.0000 mg | ORAL_CAPSULE | Freq: Every day | ORAL | 1 refills | Status: DC

## 2019-10-06 NOTE — Telephone Encounter (Signed)
Order placed to the sleep pool.

## 2019-10-06 NOTE — Patient Instructions (Addendum)
Medication Instructions:   START TAKING :  1.  POTASSIUM 2 TABLETS  10 MEQ ( 20 MEQ ) DAILY     2. MAG OX 400 MG ONCE A DAY   *If you need a refill on your cardiac medications before your next appointment, please call your pharmacy*   Lab Work:  BMET MAG LFT  LIPIDS  TSH AND  ESR   RETURN IN 7-10 DAYS  BMET AND MAG   If you have labs (blood work) drawn today and your tests are completely normal, you will receive your results only by: [name] MyChart Message (if you have MyChart) OR . A paper copy in the mail If you have any lab test that is abnormal or we need to change your treatment, we will call you to review the results.   Testing/Procedures: Your physician has recommended that you have a sleep study. This test records several body functions during sleep, including: brain activity, eye movement, oxygen and carbon dioxide blood levels, heart rate and rhythm, breathing rate and rhythm, the flow of air through your mouth and nose, snoring, body muscle movements, and chest and belly movement.   IN A WEEK Your physician has recommended that you have a pulmonary function test. Pulmonary Function Tests are a group of tests that measure how well air moves in and out of your lungs.  Follow-Up: At [facility], you and your health needs are our priority.  As part of our continuing mission to provide you with exceptional heart care, we have created designated Provider Care Teams.  These Care Teams include your primary Cardiologist (physician) and Advanced Practice Providers (APPs -  Physician Assistants and Nurse Practitioners) who all work together to provide you with the care you need, when you need it.  We recommend signing up for the patient portal called "MyChart".  Sign up information is provided on this After Visit Summary.  MyChart is used to connect with patients for Virtual Visits (Telemedicine).  Patients are able to view lab/test results, encounter notes, upcoming appointments, etc.   Non-urgent messages can be sent to your provider as well.   To learn more about what you can do with MyChart, go to [email or web address].    Your next appointment:   6 week(s)  The format for your next appointment:   In Person  Provider:    You may see Cristopher [name], MD ( STAFF MESSAGE [place] FOR ASSISTANCE TO CALL AND SCHEDULED  WHEN SHE RETURN   IF UNABLE TO )    Other Instructions

## 2019-10-06 NOTE — Telephone Encounter (Signed)
-----   Message from Claude Manges, [place] sent at 10/06/2019 12:06 PM EST ----- Regarding: PT NEEDS TO BE SET UP FOR SLEEP STUDY

## 2019-10-09 ENCOUNTER — Telehealth: Payer: Self-pay | Admitting: *Deleted

## 2019-10-09 NOTE — Telephone Encounter (Signed)
Staff message sent to Nina ok to schedule sleep study. Patient has Medicare. No PA is required. 

## 2019-10-12 ENCOUNTER — Telehealth: Payer: Self-pay

## 2019-10-12 NOTE — Addendum Note (Signed)
Addended by: Freada Bergeron on: 10/12/2019 02:12 PM   Modules accepted: Orders

## 2019-10-12 NOTE — Telephone Encounter (Signed)
The pt states Tommye Standard wanted her to have a standard pulmonary test done. Her pcp got her an appointment with [facility] to do a pulmonary test and a 6 minute walk. She would like to know if that is the same test the Pa wanted done? I told her I will send a phone note to [facility] and have her give her a call back.

## 2019-10-16 ENCOUNTER — Other Ambulatory Visit: Payer: Medicare Other | Admitting: *Deleted

## 2019-10-16 ENCOUNTER — Other Ambulatory Visit: Payer: Self-pay

## 2019-10-16 DIAGNOSIS — Z79899 Other long term (current) drug therapy: Secondary | ICD-10-CM

## 2019-10-16 NOTE — Telephone Encounter (Signed)
Patient is scheduled for lab study on 10/29/19. PT is scheduled for COVID screening on 10/26/19 1 PM prior to SS. Patient understands her sleep study will be done at [facility] sleep lab. Patient understands she will receive a sleep packet in a week or so. Patient understands to call if she does not receive the sleep packet in a timely manner.  Left detailed message on voicemail with date and time of titration and informed patient to call back to confirm or reschedule.

## 2019-10-16 NOTE — Telephone Encounter (Signed)
RE: PT NEEDS TO BE SET UP FOR SLEEP STUDY Lauralee Evener, CMA  Freada Bergeron, CMA  Ok to schedule. Patient has Medicare and does not need a PA.

## 2019-10-17 LAB — BASIC METABOLIC PANEL
BUN/Creatinine Ratio: 17 (ref 12–28)
BUN: 21 mg/dL (ref 8–27)
CO2: 23 mmol/L (ref 20–29)
Calcium: 9.4 mg/dL (ref 8.7–10.3)
Chloride: 105 mmol/L (ref 96–106)
Creatinine, Ser: 1.26 mg/dL — ABNORMAL HIGH (ref 0.57–1.00)
GFR calc Af Amer: 47 mL/min/{1.73_m2} — ABNORMAL LOW (ref >59)
GFR calc non Af Amer: 41 mL/min/{1.73_m2} — ABNORMAL LOW (ref >59)
Glucose: 110 mg/dL — ABNORMAL HIGH (ref 65–99)
Potassium: 4.4 mmol/L (ref 3.5–5.2)
Sodium: 145 mmol/L — ABNORMAL HIGH (ref 134–144)

## 2019-10-17 LAB — MAGNESIUM: Magnesium: 1.8 mg/dL (ref 1.6–2.3)

## 2019-10-18 ENCOUNTER — Other Ambulatory Visit: Payer: Self-pay | Admitting: *Deleted

## 2019-10-18 MED ORDER — MAGNESIUM OXIDE 400 MG PO CAPS: 400.0000 mg | ORAL_CAPSULE | Freq: Two times a day (BID) | ORAL | 1 refills | Status: DC

## 2019-10-19 ENCOUNTER — Telehealth: Payer: Self-pay

## 2019-10-19 ENCOUNTER — Other Ambulatory Visit: Payer: Self-pay | Admitting: Internal Medicine

## 2019-10-19 NOTE — Telephone Encounter (Signed)
The pt states RU told her to take potasium the at her last visit. The pharmacy denied her request. She wants to know should she take potassium or not. I told her I will take a phone note and have someone give her a call back.

## 2019-10-20 MED ORDER — POTASSIUM CHLORIDE ER 10 MEQ PO TBCR: 20.0000 meq | EXTENDED_RELEASE_TABLET | Freq: Every day | ORAL | 11 refills | Status: DC

## 2019-10-20 NOTE — Telephone Encounter (Signed)
Refilled potassium.  Notified patient.  Pt thanked nurse for call.

## 2019-10-26 ENCOUNTER — Other Ambulatory Visit (HOSPITAL_COMMUNITY)
Admission: RE | Admit: 2019-10-26 | Discharge: 2019-10-26 | Disposition: A | Discharge status: Home / Self Care | Payer: Medicare Other | Source: Ambulatory Visit | Attending: Cardiology | Admitting: Cardiology

## 2019-10-26 ENCOUNTER — Other Ambulatory Visit (HOSPITAL_COMMUNITY): Payer: Medicare Other

## 2019-10-26 DIAGNOSIS — Z20822 Contact with and (suspected) exposure to covid-19: Secondary | ICD-10-CM | POA: Diagnosis not present

## 2019-10-26 DIAGNOSIS — Z01812 Encounter for preprocedural laboratory examination: Secondary | ICD-10-CM | POA: Insufficient documentation

## 2019-10-26 LAB — SARS CORONAVIRUS 2 (TAT 6-24 HRS): SARS Coronavirus 2: NEGATIVE

## 2019-10-29 ENCOUNTER — Other Ambulatory Visit: Payer: Self-pay

## 2019-10-29 ENCOUNTER — Ambulatory Visit (HOSPITAL_BASED_OUTPATIENT_CLINIC_OR_DEPARTMENT_OTHER): Payer: Medicare Other | Attending: Physician Assistant | Admitting: Cardiology

## 2019-10-29 DIAGNOSIS — Z79899 Other long term (current) drug therapy: Secondary | ICD-10-CM | POA: Insufficient documentation

## 2019-10-29 DIAGNOSIS — G4733 Obstructive sleep apnea (adult) (pediatric): Secondary | ICD-10-CM | POA: Insufficient documentation

## 2019-10-29 DIAGNOSIS — Z7901 Long term (current) use of anticoagulants: Secondary | ICD-10-CM | POA: Insufficient documentation

## 2019-10-29 DIAGNOSIS — G4730 Sleep apnea, unspecified: Secondary | ICD-10-CM

## 2019-10-29 DIAGNOSIS — R5383 Other fatigue: Secondary | ICD-10-CM

## 2019-10-29 DIAGNOSIS — I493 Ventricular premature depolarization: Secondary | ICD-10-CM | POA: Insufficient documentation

## 2019-10-29 DIAGNOSIS — R000 Tachycardia, unspecified: Secondary | ICD-10-CM | POA: Insufficient documentation

## 2019-10-29 DIAGNOSIS — R0902 Hypoxemia: Secondary | ICD-10-CM | POA: Diagnosis not present

## 2019-10-29 DIAGNOSIS — R0602 Shortness of breath: Secondary | ICD-10-CM

## 2019-10-31 ENCOUNTER — Ambulatory Visit (INDEPENDENT_AMBULATORY_CARE_PROVIDER_SITE_OTHER): Payer: Medicare Other | Admitting: *Deleted

## 2019-10-31 DIAGNOSIS — I4901 Ventricular fibrillation: Secondary | ICD-10-CM | POA: Diagnosis not present

## 2019-10-31 LAB — CUP PACEART REMOTE DEVICE CHECK
Battery Remaining Longevity: 47 mo
Battery Voltage: 2.98 V
Brady Statistic AP VP Percent: 0.31 %
Brady Statistic AP VS Percent: 0.03 %
Brady Statistic AS VP Percent: 98.03 %
Brady Statistic AS VS Percent: 1.63 %
Brady Statistic RA Percent Paced: 0.34 %
Brady Statistic RV Percent Paced: 0.08 %
Date Time Interrogation Session: 20210323022602
HighPow Impedance: 59 Ohm
Implantable Lead Implant Date: 20170612
Implantable Lead Implant Date: 20170612
Implantable Lead Implant Date: 20170612
Implantable Lead Location: 753858
Implantable Lead Location: 753859
Implantable Lead Location: 753860
Implantable Lead Model: 4398
Implantable Lead Model: 5076
Implantable Pulse Generator Implant Date: 20170612
Lead Channel Impedance Value: 160.941
Lead Channel Impedance Value: 195.911
Lead Channel Impedance Value: 206.72 Ohm
Lead Channel Impedance Value: 211.021
Lead Channel Impedance Value: 223.615
Lead Channel Impedance Value: 285 Ohm
Lead Channel Impedance Value: 304 Ohm
Lead Channel Impedance Value: 304 Ohm
Lead Channel Impedance Value: 342 Ohm
Lead Channel Impedance Value: 475 Ohm
Lead Channel Impedance Value: 532 Ohm
Lead Channel Impedance Value: 551 Ohm
Lead Channel Impedance Value: 646 Ohm
Lead Channel Impedance Value: 760 Ohm
Lead Channel Impedance Value: 779 Ohm
Lead Channel Impedance Value: 817 Ohm
Lead Channel Impedance Value: 836 Ohm
Lead Channel Impedance Value: 874 Ohm
Lead Channel Pacing Threshold Amplitude: 0.75 V
Lead Channel Pacing Threshold Amplitude: 0.75 V
Lead Channel Pacing Threshold Amplitude: 1.375 V
Lead Channel Pacing Threshold Pulse Width: 0.4 ms
Lead Channel Pacing Threshold Pulse Width: 0.4 ms
Lead Channel Pacing Threshold Pulse Width: 0.8 ms
Lead Channel Sensing Intrinsic Amplitude: 3.375 mV
Lead Channel Sensing Intrinsic Amplitude: 3.375 mV
Lead Channel Sensing Intrinsic Amplitude: 4.25 mV
Lead Channel Sensing Intrinsic Amplitude: 4.25 mV
Lead Channel Setting Pacing Amplitude: 1.75 V
Lead Channel Setting Pacing Amplitude: 2 V
Lead Channel Setting Pacing Amplitude: 2.5 V
Lead Channel Setting Pacing Pulse Width: 0.4 ms
Lead Channel Setting Pacing Pulse Width: 0.8 ms
Lead Channel Setting Sensing Sensitivity: 0.3 mV

## 2019-10-31 NOTE — Procedures (Signed)
Patient Name: Lisa [name], Creech Date: 10/29/2019 Gender: Female D.O.B: 1941-05-21 Age (years): 78 Referring Provider: Brien Few Charlcie Cradle PA-C Height (inches): 59 Interpreting Physician: Fransico Him MD, ABSM Weight (lbs): 247 RPSGT: Lanae Boast BMI: 45 MRN: [id number] Neck Size: 15.50  CLINICAL INFORMATION Sleep Study Type: Split Night CPAP  Indication for sleep study: Congestive Heart Failure, Hypertension, Obesity  Epworth Sleepiness Score: 5  SLEEP STUDY TECHNIQUE As per the AASM Manual for the Scoring of Sleep and Associated Events v2.3 (April 2016) with a hypopnea requiring 4% desaturations.  The channels recorded and monitored were frontal, central and occipital EEG, electrooculogram (EOG), submentalis EMG (chin), nasal and oral airflow, thoracic and abdominal wall motion, anterior tibialis EMG, snore microphone, electrocardiogram, and pulse oximetry. Continuous positive airway pressure (CPAP) was initiated when the patient met split night criteria and was titrated according to treat sleep-disordered breathing.  MEDICATIONS Medications self-administered by patient taken the night of the study : CARVEDILOL, anuadrone  RESPIRATORY PARAMETERS Diagnostic Total AHI (/hr): 17.4  RDI (/hr):18.1  OA Index (/hr): 8.7  CA Index (/hr): 0.0 REM AHI (/hr): 66.3  NREM AHI (/hr):4.3  Supine AHI (/hr):29.6  Non-supine AHI (/hr):0 Min O2 Sat (%):74.0  Mean O2 (%): 90.6  Time below 88% (min):18   Titration Optimal Pressure (cm):N/A  AHI at Optimal Pressure (/hr):N/A  Min O2 at Optimal Pressure (%):82.0 Supine % at Optimal (%):N/A  Sleep % at Optimal (%):N/A   SLEEP ARCHITECTURE The recording time for the entire night was 439.6 minutes.  During a baseline period of 174.7 minutes, the patient slept for 158.9 minutes in REM and nonREM, yielding a sleep efficiency of 90.9%. Sleep onset after lights out was 6.3 minutes with a REM latency of 98.0 minutes. The  patient spent 10.7% of the night in stage N1 sleep, 68.2% in stage N2 sleep, 0.0% in stage N3 and 21.1% in REM.  During the titration period of 260.4 minutes, the patient slept for 127.3 minutes in REM and nonREM, yielding a sleep efficiency of 48.9%. Sleep onset after CPAP initiation was 5.1 minutes with a REM latency of 89.5 minutes. The patient spent 10.2% of the night in stage N1 sleep, 56.0% in stage N2 sleep, 0.0% in stage N3 and 33.8% in REM.  CARDIAC DATA The 2 lead EKG demonstrated sinus rhythm. The mean heart rate was 100.0 beats per minute. Other EKG findings include: PVCs, wide complex tachycardia for 4 beats, aberrantly conducted beats.  LEG MOVEMENT DATA The total Periodic Limb Movements of Sleep (PLMS) were 0. The PLMS index was 0.0 .  IMPRESSIONS - Moderate obstructive sleep apnea occurred during the diagnostic portion of the study(AHI = 17.4/hour). An optimal PAP pressure could not be selected for this patient based on the available study data. - No significant central sleep apnea occurred during the diagnostic portion of the study (CAI = 0.0/hour). - Moderate oxygen desaturation was noted during the diagnostic portion of the study (Min O2 =74.0%). - The patient snored with moderate snoring volume during the diagnostic portion of the study. - EKG findings include PVCs. - Clinically significant periodic limb movements did not occur during sleep.  DIAGNOSIS - Obstructive Sleep Apnea (327.23 [G47.33 ICD-10]) - Nocturnal Hypoxemia - PVCs and wide complex tachycardia up to 4 beats.  RECOMMENDATIONS - Recommend BiPAP titration given sub-optimal CPAP titration. - Avoid alcohol, sedatives and other CNS depressants that may worsen sleep apnea and disrupt normal sleep architecture. - Sleep hygiene should be reviewed to assess factors that may  improve sleep quality. - Weight management and regular exercise should be initiated or continued.  [Electronically signed] 10/31/2019 07:17  PM  Fransico Him MD, ABSM Diplomate, American Board of Sleep Medicine

## 2019-11-01 ENCOUNTER — Telehealth: Payer: Self-pay | Admitting: *Deleted

## 2019-11-01 DIAGNOSIS — G4730 Sleep apnea, unspecified: Secondary | ICD-10-CM

## 2019-11-01 NOTE — Telephone Encounter (Signed)
Staff message sent to [place]. Ok to schedule BIPAP titration. Patient has Medicare and does not require a PA.

## 2019-11-01 NOTE — Progress Notes (Signed)
ICD Remote  

## 2019-11-01 NOTE — Telephone Encounter (Signed)
Informed patient of sleep study results and patient understanding was verbalized. Patient understands her sleep study showed they have sleep apnea and recommend BiPAP titration as she had unsuccessful CPAP titration. Please set up titration in the sleep lab.  Pt is aware and agreeable to her results.  Bipap Titration sent to sleep pool

## 2019-11-01 NOTE — Telephone Encounter (Signed)
-----   Message from Sueanne Margarita, MD sent at 10/31/2019  7:21 PM EDT ----- Please let patient know that they have sleep apnea and recommend BiPAP titration as she had unsuccessful CPAP titration. Please set up titration in the sleep lab.

## 2019-11-03 NOTE — Addendum Note (Signed)
Addended by: Freada Bergeron on: 11/03/2019 12:34 PM   Modules accepted: Orders

## 2019-11-06 ENCOUNTER — Telehealth: Payer: Self-pay | Admitting: *Deleted

## 2019-11-06 NOTE — Telephone Encounter (Signed)
Patient would like to have her sleep study and covid test cancelled until our policy changes where patients do not have to get covid tested to have an appointment.

## 2019-11-07 NOTE — Telephone Encounter (Signed)
RE: precert Lauralee Evener, CMA  Freada Bergeron, CMA  Ok to schedule. Patient has [employer].          ----- Message -----  From: Freada Bergeron, CMA  Sent: 11/01/2019 12:36 PM EDT  To: [facility]  Subject: precert                      BiPAP titration

## 2019-11-07 NOTE — Telephone Encounter (Addendum)
Patient is scheduled for lab study on 11/26/19. Pt is scheduled for COVID screening on 11/24/19 prior to Bipap titration.  Patient understands her titration study will be done at [facility] sleep lab. Patient understands she will receive a sleep packet in a week or so. Patient understands to call if she does not receive the sleep packet in a timely manner. Patient DOES NOT AGREE with treatment  SHE CANCELLED HER TEST and thanked me for call. Once the covid screenings are lifted she will do her sleep study.

## 2019-11-21 ENCOUNTER — Encounter: Payer: Self-pay | Admitting: Internal Medicine

## 2019-11-21 ENCOUNTER — Ambulatory Visit (INDEPENDENT_AMBULATORY_CARE_PROVIDER_SITE_OTHER): Payer: Medicare Other | Admitting: Internal Medicine

## 2019-11-21 ENCOUNTER — Other Ambulatory Visit: Payer: Self-pay

## 2019-11-21 VITALS — BP 110/62 | HR 64 | Ht 61.5 in | Wt 246.0 lb

## 2019-11-21 DIAGNOSIS — I4901 Ventricular fibrillation: Secondary | ICD-10-CM | POA: Diagnosis not present

## 2019-11-21 DIAGNOSIS — I5022 Chronic systolic (congestive) heart failure: Secondary | ICD-10-CM

## 2019-11-21 DIAGNOSIS — I2510 Atherosclerotic heart disease of native coronary artery without angina pectoris: Secondary | ICD-10-CM

## 2019-11-21 DIAGNOSIS — Z95810 Presence of automatic (implantable) cardiac defibrillator: Secondary | ICD-10-CM

## 2019-11-21 LAB — CUP PACEART INCLINIC DEVICE CHECK
Battery Remaining Longevity: 45 mo
Battery Voltage: 2.97 V
Brady Statistic AP VP Percent: 0.21 %
Brady Statistic AP VS Percent: 0.02 %
Brady Statistic AS VP Percent: 98.15 %
Brady Statistic AS VS Percent: 1.62 %
Brady Statistic RA Percent Paced: 0.23 %
Brady Statistic RV Percent Paced: 0.09 %
Date Time Interrogation Session: 20210413120500
HighPow Impedance: 63 Ohm
Implantable Lead Implant Date: 20170612
Implantable Lead Implant Date: 20170612
Implantable Lead Implant Date: 20170612
Implantable Lead Location: 753858
Implantable Lead Location: 753859
Implantable Lead Location: 753860
Implantable Lead Model: 4398
Implantable Lead Model: 5076
Implantable Pulse Generator Implant Date: 20170612
Lead Channel Impedance Value: 1064 Ohm
Lead Channel Impedance Value: 175.622
Lead Channel Impedance Value: 218.88 Ohm
Lead Channel Impedance Value: 226.51 Ohm
Lead Channel Impedance Value: 235.862
Lead Channel Impedance Value: 244.746
Lead Channel Impedance Value: 247 Ohm
Lead Channel Impedance Value: 342 Ohm
Lead Channel Impedance Value: 342 Ohm
Lead Channel Impedance Value: 361 Ohm
Lead Channel Impedance Value: 532 Ohm
Lead Channel Impedance Value: 589 Ohm
Lead Channel Impedance Value: 608 Ohm
Lead Channel Impedance Value: 760 Ohm
Lead Channel Impedance Value: 817 Ohm
Lead Channel Impedance Value: 874 Ohm
Lead Channel Impedance Value: 931 Ohm
Lead Channel Impedance Value: 931 Ohm
Lead Channel Pacing Threshold Amplitude: 0.75 V
Lead Channel Pacing Threshold Amplitude: 1 V
Lead Channel Pacing Threshold Amplitude: 1.75 V
Lead Channel Pacing Threshold Pulse Width: 0.4 ms
Lead Channel Pacing Threshold Pulse Width: 0.4 ms
Lead Channel Pacing Threshold Pulse Width: 0.8 ms
Lead Channel Sensing Intrinsic Amplitude: 3.9 mV
Lead Channel Sensing Intrinsic Amplitude: 5.3 mV
Lead Channel Setting Pacing Amplitude: 1.75 V
Lead Channel Setting Pacing Amplitude: 2.25 V
Lead Channel Setting Pacing Amplitude: 2.5 V
Lead Channel Setting Pacing Pulse Width: 0.4 ms
Lead Channel Setting Pacing Pulse Width: 0.8 ms
Lead Channel Setting Sensing Sensitivity: 0.3 mV

## 2019-11-21 NOTE — Progress Notes (Signed)
HPI Mrs. Kosel returns today for followup. She is a 79 yo woman with a h/o CAD, LV dysfunction, VT/VF, and very brief PAF. It was decided that she not be systemically anti-coagulated because of the very brief duration of her atrial fib. She has in the interim, done well with improvement of her dyspnea since taking her lasix as instructed. She is tired of not driving.  Allergies  Allergen Reactions  . Flagyl [Metronidazole] Itching, Rash and Other (See Comments)    Welts, also  . Penicillins Hives, Itching and Rash    Has patient had a PCN reaction causing immediate rash, facial/tongue/throat swelling, SOB or lightheadedness with hypotension: Yes  Has patient had a PCN reaction causing severe rash involving mucus membranes or skin necrosis: Yes Has patient had a PCN reaction that required hospitalization No Has patient had a PCN reaction occurring within the last 10 years: NO If all of the above answers are "NO", then may proceed with Cephalosporin use.   . Sulfamethoxazole Rash  . Meperidine Nausea And Vomiting  . Morphine And Related Nausea And Vomiting  . Tape Other (See Comments)    "THE PLASTIC, CLEAR TAPE CAN/DOES PULL OFF MY SKIN."  . Doxycycline Nausea Only, Rash and Other (See Comments)    Made her "feel terrible"  . Lanolin Itching and Rash  . Sulfa Antibiotics Rash     Current Outpatient Medications  Medication Sig Dispense Refill  . acetaminophen (TYLENOL) 500 MG tablet Take 500-1,000 mg by mouth every 6 (six) hours as needed for moderate pain.    [name] allopurinol (ZYLOPRIM) 100 MG tablet Take 100 mg by mouth daily.     [name] amiodarone (PACERONE) 200 MG tablet Take 200 mg by mouth daily.    [name] aspirin EC 81 MG tablet Take 81 mg by mouth at bedtime.     [name] atorvastatin (LIPITOR) 20 MG tablet Take 1 tablet (20 mg total) by mouth daily. 90 tablet 3  . carvedilol (COREG) 25 MG tablet Take 1 tablet (25 mg total) by mouth 2 (two) times daily. 180 tablet 3  . Cholecalciferol  (VITAMIN D-3 PO) Take 5,000 Units by mouth daily.    . Cyanocobalamin (VITAMIN B-12 PO) Take 1 tablet by mouth daily.    . furosemide (LASIX) 20 MG tablet Take 20 mg by mouth See admin instructions. Take 20 mg by mouth in the morning and an additional 20 mg up to 1-2 times a week as needed for edema or shortness of breath    . imatinib (GLEEVEC) 400 MG tablet Take 400 mg by mouth daily with supper.     . Inulin (METAMUCIL CLEAR & NATURAL PO) Take by mouth See admin instructions. Mix 1 teaspoonful into 8 ounces of water and drink once a day    . lisinopril (ZESTRIL) 5 MG tablet Take 1 tablet (5 mg total) by mouth daily. 90 tablet 3  . Magnesium Oxide 400 MG CAPS Take 1 capsule (400 mg total) by mouth 2 (two) times daily. 180 capsule 1  . Multiple Vitamin (MULTIVITAMIN WITH MINERALS) TABS tablet Take 1 tablet by mouth daily.    . nitroGLYCERIN (NITROSTAT) 0.4 MG SL tablet Place 1 tablet (0.4 mg total) under the tongue every 5 (five) minutes x 3 doses as needed for chest pain. 25 tablet 3  . ondansetron (ZOFRAN-ODT) 4 MG disintegrating tablet Take 4 mg by mouth every 8 (eight) hours as needed for nausea or vomiting (DISSOLVE IN THE MOUTH).     [name]  potassium chloride (KLOR-CON) 10 MEQ tablet Take 2 tablets (20 mEq total) by mouth daily. 60 tablet 11  . thyroid (ARMOUR) 15 MG tablet Take 15 mg by mouth daily before breakfast.      No current facility-administered medications for this visit.     Past Medical History:  Diagnosis Date  . CAD in native artery 2017   stent to LAD  . Cardiac arrest ([place]) 09/2015  . CHF (congestive heart failure) ([place])   . CML (chronic myelocytic leukemia) ([place])   . Complication of anesthesia   . Hx: UTI (urinary tract infection)   . Hypertension   . Melanoma ([place])   . PONV (postoperative nausea and vomiting)   . PVC (premature ventricular contraction)    HISTORY OF  . Thyroid disease   . V tach ([place])     ROS:   All systems reviewed and negative except as noted  in the HPI.   Past Surgical History:  Procedure Laterality Date  . ABDOMINAL HYSTERECTOMY    . APPENDECTOMY    . CARDIAC CATHETERIZATION N/A 10/03/2015   Procedure: Left Heart Cath and Coronary Angiography;  Surgeon: [name], MD;  Location: [facility];  Service: Cardiovascular;  Laterality: N/A;  . CARDIAC CATHETERIZATION N/A 10/07/2015   Procedure: Coronary Stent Intervention;  Surgeon: [name], MD;  Location: [facility];  Service: Cardiovascular;  Laterality: N/A;  . CARDIAC CATHETERIZATION  09/22/2019  . EP IMPLANTABLE DEVICE N/A 01/20/2016   Procedure: BiV ICD Insertion CRT-D;  Surgeon: [name], MD;  Location: [facility];  Service: Cardiovascular;  Laterality: N/A;  . LEFT HEART CATH AND CORONARY ANGIOGRAPHY N/A 09/22/2019   Procedure: LEFT HEART CATH AND CORONARY ANGIOGRAPHY;  Surgeon: Martinique, Peter M, MD;  Location: [facility];  Service: Cardiovascular;  Laterality: N/A;  . TONSILLECTOMY    . TONSILLECTOMY    . TUBAL LIGATION       Family History  Problem Relation Age of Onset  . Coronary artery disease Mother   . Thyroid disease Mother   . Multiple myeloma Father   . Heart attack Maternal Grandfather   . Heart attack Paternal Grandfather      Social History   Socioeconomic History  . Marital status: Divorced    Spouse name: Not on file  . Number of children: Not on file  . Years of education: Not on file  . Highest education level: Not on file  Occupational History  . Not on file  Tobacco Use  . Smoking status: Never Smoker  . Smokeless tobacco: Never Used  Substance and Sexual Activity  . Alcohol use: No    Alcohol/week: 0.0 standard drinks  . Drug use: No  . Sexual activity: Not on file  Other Topics Concern  . Not on file  Social History Narrative  . Not on file   Social Determinants of Health   Financial Resource Strain:   . Difficulty of Paying Living Expenses:   Food Insecurity:   .  Worried About Charity fundraiser in the Last Year:   . Arboriculturist in the Last Year:   Transportation Needs:   . Film/video editor (Medical):   [name] Lack of Transportation (Non-Medical):   Physical Activity:   . Days of Exercise per Week:   . Minutes of Exercise per Session:   Stress:   . Feeling of Stress :   Social Connections:   . Frequency  of Communication with Friends and Family:   . Frequency of Social Gatherings with Friends and Family:   . Attends Religious Services:   . Active Member of Clubs or Organizations:   . Attends Archivist Meetings:   [name] Marital Status:   Intimate Partner Violence:   . Fear of Current or Ex-Partner:   . Emotionally Abused:   [name] Physically Abused:   . Sexually Abused:      BP 110/62   Pulse 64   Ht 5' 1.5" (1.562 m)   Wt 246 lb (111.6 kg)   LMP 02/14/1995   SpO2 99%   BMI 45.73 kg/m   Physical Exam:  Well appearing NAD HEENT: Unremarkable Neck:  No JVD, no thyromegally Lymphatics:  No adenopathy Back:  No CVA tenderness Lungs:  Clear with no wheezes HEART:  Regular rate rhythm, no murmurs, no rubs, no clicks Abd:  soft, positive bowel sounds, no organomegally, no rebound, no guarding Ext:  2 plus pulses, severe, woody, bilateral lymph edema, no cyanosis, no clubbing Skin:  No rashes no nodules Neuro:  CN II through XII intact, motor grossly intact  DEVICE  Normal device function.  See PaceArt for details.   Assess/Plan: 1. VF - she has had no additional symptoms since her dose of amiodarone was added. She will continue her amiodarone.  2. HTN - her bp is well controlled. No change in meds. 3. Obesity - she has lost weight. She is encouraged top continue 4. Chronic systolic heart failure - she has improved as she has been taking her meds. She is encouraged to maintain a low sodium diet.   Salome Spotted.

## 2019-11-21 NOTE — Patient Instructions (Addendum)
Medication Instructions:  Your physician recommends that you continue on your current medications as directed. Please refer to the Current Medication list given to you today.  Labwork: None ordered.  Testing/Procedures: None ordered.  Follow-Up: Your physician wants you to follow-up in: 6 months with Dr. Lovena Le.   You will receive a reminder letter in the mail two months in advance. If you don't receive a letter, please call our office to schedule the follow-up appointment.  Remote monitoring is used to monitor your ICD from home. This monitoring reduces the number of office visits required to check your device to one time per year. It allows [place] to keep an eye on the functioning of your device to ensure it is working properly. You are scheduled for a device check from home on 01/30/2020. You may send your transmission at any time that day. If you have a wireless device, the transmission will be sent automatically. After your physician reviews your transmission, you will receive a postcard with your next transmission date.  Any Other Special Instructions Will Be Listed Below (If Applicable).  If you need a refill on your cardiac medications before your next appointment, please call your pharmacy.

## 2019-11-24 ENCOUNTER — Other Ambulatory Visit (HOSPITAL_COMMUNITY): Payer: Medicare Other

## 2019-11-26 ENCOUNTER — Ambulatory Visit (HOSPITAL_BASED_OUTPATIENT_CLINIC_OR_DEPARTMENT_OTHER): Payer: Medicare Other | Admitting: Cardiology

## 2019-12-01 ENCOUNTER — Telehealth: Payer: Self-pay

## 2019-12-01 MED ORDER — AMIODARONE HCL 200 MG PO TABS: 200.0000 mg | ORAL_TABLET | Freq: Every day | ORAL | 3 refills | Status: DC

## 2019-12-01 NOTE — Telephone Encounter (Signed)
Refill sent to [facility] pharmacy

## 2019-12-01 NOTE — Telephone Encounter (Signed)
Pt left a voicemail that she is having a difficult time getting her amiodarone. She states it was written wrong but got fixed in her chart however it did not get fix at the pharmacy. She needs 200 mg with a 90 day prescription.

## 2019-12-04 NOTE — Telephone Encounter (Signed)
Call placed to Pt.  She was able to get her amiodarone.

## 2020-01-16 NOTE — Telephone Encounter (Signed)
Pt sleep orders canceled and she will contact out office when comfortable to have study

## 2020-01-23 NOTE — Progress Notes (Signed)
Cardiology Office Note   Date:  01/25/2020   ID:  Lisa [name], DOB 17-Jun-1941, MRN [id number]  PCP:  [name], MD  Cardiologist:   [name], MD   F/U of CHF      History of Present Illness: Lisa [name] is a 79 y.o. 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  Cardiac catheterization was done.  This showed a 65% ramus narrowing.  LVEDP was moderately elevated at 23.Lisa [name]  LVEF was 50 to 55%.  She was seen by EP.  Placed on amiodarone.  She was seen by Lisa [name] in march in clinic.  The patient denies palpitations.  Breathing is stable.  She does get some shortness of breath.  She denies chest pressure.  Her internist is concerned about kidney function has asked her to drink 2 L/day.  Patient feels this is too much.   Current Meds  Medication Sig  . acetaminophen (TYLENOL) 500 MG tablet Take 500-1,000 mg by mouth every 6 (six) hours as needed for moderate pain.  Lisa [name] allopurinol (ZYLOPRIM) 100 MG tablet Take 100 mg by mouth daily.   Lisa [name] amiodarone (PACERONE) 200 MG tablet Take 1 tablet (200 mg total) by mouth daily.  Lisa [name] aspirin EC 81 MG tablet Take 81 mg by mouth at bedtime.   Lisa [name] atorvastatin (LIPITOR) 20 MG tablet Take 1 tablet (20 mg total) by mouth daily.  . carvedilol (COREG) 25 MG tablet Take 1 tablet (25 mg total) by mouth 2 (two) times daily.  . Cholecalciferol (VITAMIN Lisa-3 PO) Take 5,000 Units by mouth daily.  . Cyanocobalamin (VITAMIN B-12 PO) Take 1 tablet by mouth  daily.  . furosemide (LASIX) 20 MG tablet Take 20 mg by mouth See admin instructions. Take 20 mg by mouth in the morning and an additional 20 mg up to 1-2 times a week as needed for edema or shortness of breath  . imatinib (GLEEVEC) 400 MG tablet Take 400 mg by mouth daily with supper.   . Inulin (METAMUCIL CLEAR & NATURAL PO) Take by mouth See admin instructions. Mix 1 teaspoonful into 8 ounces of water and drink once a day  . lisinopril (ZESTRIL) 5 MG tablet Take 1 tablet (5 mg total) by mouth daily.  . Magnesium Oxide 400 MG CAPS Take 1 capsule (400 mg total) by mouth 2 (two) times daily.  . Multiple Vitamin (MULTIVITAMIN WITH MINERALS) TABS tablet Take 1 tablet by mouth daily.  . nitroGLYCERIN (NITROSTAT) 0.4 MG SL tablet Place 1 tablet (0.4 mg total) under the tongue every 5 (five) minutes x 3 doses as needed for chest pain.  Lisa [name] ondansetron (ZOFRAN-ODT) 4 MG disintegrating tablet Take 4 mg by mouth every 8 (eight) hours as needed for nausea or vomiting (DISSOLVE IN THE MOUTH).   . potassium chloride (KLOR-CON) 10 MEQ tablet Take 2 tablets (20 mEq total) by mouth daily.  Lisa [name] thyroid (ARMOUR) 15 MG tablet Take  15 mg by mouth daily before breakfast.   . [DISCONTINUED] carvedilol (COREG) 25 MG tablet Take 1 tablet (25 mg total) by mouth 2 (two) times daily.     Allergies:   Flagyl [metronidazole], Penicillins, Sulfamethoxazole, Meperidine, Morphine and related, Tape, Doxycycline, Lanolin, and Sulfa antibiotics   Past Medical History:  Diagnosis Date  . CAD in native artery 2017   stent to LAD  . Cardiac arrest ([place]) 09/2015  . CHF (congestive heart failure) ([place])   . CML (chronic myelocytic leukemia) ([place])   . Complication of anesthesia   . Hx: UTI (urinary tract infection)   . Hypertension   . Melanoma ([place])   . PONV (postoperative nausea and vomiting)   . PVC (premature ventricular contraction)    HISTORY OF  . Thyroid disease   . V tach [facility])     Past Surgical History:  Procedure  Laterality Date  . ABDOMINAL HYSTERECTOMY    . APPENDECTOMY    . CARDIAC CATHETERIZATION N/A 10/03/2015   Procedure: Left Heart Cath and Coronary Angiography;  Surgeon: [name], MD;  Location: [facility];  Service: Cardiovascular;  Laterality: N/A;  . CARDIAC CATHETERIZATION N/A 10/07/2015   Procedure: Coronary Stent Intervention;  Surgeon: [name], MD;  Location: [facility];  Service: Cardiovascular;  Laterality: N/A;  . CARDIAC CATHETERIZATION  09/22/2019  . EP IMPLANTABLE DEVICE N/A 01/20/2016   Procedure: BiV ICD Insertion CRT-Lisa;  Surgeon: [name], MD;  Location: [facility];  Service: Cardiovascular;  Laterality: N/A;  . LEFT HEART CATH AND CORONARY ANGIOGRAPHY N/A 09/22/2019   Procedure: LEFT HEART CATH AND CORONARY ANGIOGRAPHY;  Surgeon: Martinique, Peter M, MD;  Location: [place] [facility];  Service: Cardiovascular;  Laterality: N/A;  . TONSILLECTOMY    . TONSILLECTOMY    . TUBAL LIGATION       Social History:  The patient  reports that she has never smoked. She has never used smokeless tobacco. She reports that she does not drink alcohol and does not use drugs.   Family History:  The patient's family history includes Coronary artery disease in her mother; Heart attack in her maternal grandfather and paternal grandfather; Multiple myeloma in her father; Thyroid disease in her mother.    ROS:  Please see the history of present illness. All other systems are reviewed and  Negative to the above problem except as noted.    PHYSICAL EXAM: VS:  BP (!) 110/52   Pulse 76   Ht 5' 1.5" (1.562 m)   Wt 247 lb 9.6 oz (112.3 kg)   LMP 02/14/1995   BMI 46.03 kg/m   GEN: Morbidly obese 79 yo in no acute distress  HEENT: normal  Neck: JVP does not appear elevated   no bruits. \Cardiac: RRR; no murmurs,    Chronic lymphedema of legs  Respiratory:  clear to auscultation bilaterally, normal work of breathing GI: soft, nontender, nondistended, +  BS  No hepatomegaly   Skin: warm and dry, no rash Neuro:  Strength and sensation are intact Psych: euthymic mood, full affect   EKG:  EKG is ordered today.  Sinus rhythm.  75 bpm paced.   Lipid Panel    Component Value Date/Time   CHOL 92 (L) 10/06/2019 1059   TRIG 92 10/06/2019 1059   HDL 39 (L) 10/06/2019 1059   CHOLHDL 2.4 10/06/2019 1059   CHOLHDL 4.2 10/04/2015 0329   VLDL 18 10/04/2015 0329   LDLCALC 35  10/06/2019 1059      Wt Readings from Last 3 Encounters:  01/24/20 247 lb 9.6 oz (112.3 kg)  11/21/19 246 lb (111.6 kg)  10/29/19 247 lb (112 kg)      ASSESSMENT AND PLAN:  1  Hx chronic systoiic CHF   Pt now s/p CRT Lisa  Echo in September shows LVEf is still normal    VOlume looks good on exam.   I think 2 L probably too much  Would keep at 1.5 L     2   CAD cath in February after a VT event.  No obstructive CAD noted.  Continue to follow.  Keep on Lipitor  3  Hx VF arrest   S/p Medtronid CRT-Lisa.  Now she is on amiodarone.    4 Transient PAF so short-lived she is not on anticoagulation.  Has the device to follow.  5  HTN  BP is controlled on current regimen.  7.  CML.  Continues on Gleevec.  Followed in oncology pill will defer labs to them.  F/U in 6 months    [name]  Current medicines are reviewed at length with the patient today.  The patient does not have concerns regarding medicines.  Signed, [name], MD  01/25/2020 9:32 AM    [address], [place], [place]  [address] Phone: [phone]; Fax: [phone]

## 2020-01-24 ENCOUNTER — Encounter: Payer: Self-pay | Admitting: Internal Medicine

## 2020-01-24 ENCOUNTER — Ambulatory Visit (INDEPENDENT_AMBULATORY_CARE_PROVIDER_SITE_OTHER): Payer: Medicare Other | Admitting: Internal Medicine

## 2020-01-24 ENCOUNTER — Other Ambulatory Visit: Payer: Self-pay

## 2020-01-24 VITALS — BP 110/52 | HR 76 | Ht 61.5 in | Wt 247.6 lb

## 2020-01-24 DIAGNOSIS — I2510 Atherosclerotic heart disease of native coronary artery without angina pectoris: Secondary | ICD-10-CM

## 2020-01-24 DIAGNOSIS — I5022 Chronic systolic (congestive) heart failure: Secondary | ICD-10-CM

## 2020-01-24 LAB — BASIC METABOLIC PANEL
BUN/Creatinine Ratio: 18 (ref 12–28)
BUN: 33 mg/dL — ABNORMAL HIGH (ref 8–27)
CO2: 23 mmol/L (ref 20–29)
Calcium: 8.9 mg/dL (ref 8.7–10.3)
Chloride: 105 mmol/L (ref 96–106)
Creatinine, Ser: 1.8 mg/dL — ABNORMAL HIGH (ref 0.57–1.00)
GFR calc Af Amer: 31 mL/min/{1.73_m2} — ABNORMAL LOW (ref >59)
GFR calc non Af Amer: 27 mL/min/{1.73_m2} — ABNORMAL LOW (ref >59)
Glucose: 112 mg/dL — ABNORMAL HIGH (ref 65–99)
Potassium: 5 mmol/L (ref 3.5–5.2)
Sodium: 139 mmol/L (ref 134–144)

## 2020-01-24 LAB — MAGNESIUM: Magnesium: 2.1 mg/dL (ref 1.6–2.3)

## 2020-01-24 MED ORDER — CARVEDILOL 25 MG PO TABS: 25.0000 mg | ORAL_TABLET | Freq: Two times a day (BID) | ORAL | 3 refills | Status: DC

## 2020-01-24 NOTE — Patient Instructions (Addendum)
Medication Instructions:  No changes *If you need a refill on your cardiac medications before your next appointment, please call your pharmacy*   Lab Work: Today: cmet, magnesium  If you have labs (blood work) drawn today and your tests are completely normal, you will receive your results only by: [name] MyChart Message (if you have MyChart) OR . A paper copy in the mail If you have any lab test that is abnormal or we need to change your treatment, we will call you to review the results.   Testing/Procedures: none   Follow-Up: At [facility], you and your health needs are our priority.  As part of our continuing mission to provide you with exceptional heart care, we have created designated Provider Care Teams.  These Care Teams include your primary Cardiologist (physician) and Advanced Practice Providers (APPs -  Physician Assistants and Nurse Practitioners) who all work together to provide you with the care you need, when you need it.  Your next appointment:   6 month(s)  The format for your next appointment:   In Person  Provider:   You may see [name], MD or one of the following Advanced Practice Providers on your designated Care Team:    [name], PA-C  [name], [place]  Other Instructions

## 2020-01-30 ENCOUNTER — Ambulatory Visit (INDEPENDENT_AMBULATORY_CARE_PROVIDER_SITE_OTHER): Payer: Medicare Other | Admitting: *Deleted

## 2020-01-30 DIAGNOSIS — I5022 Chronic systolic (congestive) heart failure: Secondary | ICD-10-CM

## 2020-01-30 LAB — CUP PACEART REMOTE DEVICE CHECK
Battery Remaining Longevity: 44 mo
Battery Voltage: 2.97 V
Brady Statistic AP VP Percent: 0.85 %
Brady Statistic AP VS Percent: 0.11 %
Brady Statistic AS VP Percent: 95.75 %
Brady Statistic AS VS Percent: 3.29 %
Brady Statistic RA Percent Paced: 0.96 %
Brady Statistic RV Percent Paced: 0.17 %
Date Time Interrogation Session: 20210622012502
HighPow Impedance: 61 Ohm
Implantable Lead Implant Date: 20170612
Implantable Lead Implant Date: 20170612
Implantable Lead Implant Date: 20170612
Implantable Lead Location: 753858
Implantable Lead Location: 753859
Implantable Lead Location: 753860
Implantable Lead Model: 4398
Implantable Lead Model: 5076
Implantable Pulse Generator Implant Date: 20170612
Lead Channel Impedance Value: 1121 Ohm
Lead Channel Impedance Value: 175.622
Lead Channel Impedance Value: 218.88 Ohm
Lead Channel Impedance Value: 226.51 Ohm
Lead Channel Impedance Value: 242.71 Ohm
Lead Channel Impedance Value: 247 Ohm
Lead Channel Impedance Value: 252.127
Lead Channel Impedance Value: 304 Ohm
Lead Channel Impedance Value: 342 Ohm
Lead Channel Impedance Value: 361 Ohm
Lead Channel Impedance Value: 475 Ohm
Lead Channel Impedance Value: 608 Ohm
Lead Channel Impedance Value: 608 Ohm
Lead Channel Impedance Value: 836 Ohm
Lead Channel Impedance Value: 836 Ohm
Lead Channel Impedance Value: 874 Ohm
Lead Channel Impedance Value: 950 Ohm
Lead Channel Impedance Value: 988 Ohm
Lead Channel Pacing Threshold Amplitude: 0.75 V
Lead Channel Pacing Threshold Amplitude: 1 V
Lead Channel Pacing Threshold Amplitude: 1.875 V
Lead Channel Pacing Threshold Pulse Width: 0.4 ms
Lead Channel Pacing Threshold Pulse Width: 0.4 ms
Lead Channel Pacing Threshold Pulse Width: 0.8 ms
Lead Channel Sensing Intrinsic Amplitude: 3.5 mV
Lead Channel Sensing Intrinsic Amplitude: 3.5 mV
Lead Channel Sensing Intrinsic Amplitude: 4.375 mV
Lead Channel Sensing Intrinsic Amplitude: 4.375 mV
Lead Channel Setting Pacing Amplitude: 2 V
Lead Channel Setting Pacing Amplitude: 2.5 V
Lead Channel Setting Pacing Amplitude: 3 V
Lead Channel Setting Pacing Pulse Width: 0.4 ms
Lead Channel Setting Pacing Pulse Width: 0.8 ms
Lead Channel Setting Sensing Sensitivity: 0.3 mV

## 2020-01-31 NOTE — Progress Notes (Signed)
Remote ICD transmission.   

## 2020-02-01 ENCOUNTER — Other Ambulatory Visit: Payer: Self-pay | Admitting: *Deleted

## 2020-02-01 DIAGNOSIS — I5022 Chronic systolic (congestive) heart failure: Secondary | ICD-10-CM

## 2020-02-15 ENCOUNTER — Other Ambulatory Visit: Payer: Medicare Other

## 2020-02-19 ENCOUNTER — Other Ambulatory Visit: Payer: Self-pay

## 2020-02-19 ENCOUNTER — Other Ambulatory Visit: Payer: Medicare Other | Admitting: *Deleted

## 2020-02-19 DIAGNOSIS — I5022 Chronic systolic (congestive) heart failure: Secondary | ICD-10-CM

## 2020-02-20 DIAGNOSIS — R7989 Other specified abnormal findings of blood chemistry: Secondary | ICD-10-CM

## 2020-02-20 LAB — BASIC METABOLIC PANEL
BUN/Creatinine Ratio: 16 (ref 12–28)
BUN: 36 mg/dL — ABNORMAL HIGH (ref 8–27)
CO2: 23 mmol/L (ref 20–29)
Calcium: 8.8 mg/dL (ref 8.7–10.3)
Chloride: 104 mmol/L (ref 96–106)
Creatinine, Ser: 2.22 mg/dL — ABNORMAL HIGH (ref 0.57–1.00)
GFR calc Af Amer: 24 mL/min/{1.73_m2} — ABNORMAL LOW (ref >59)
GFR calc non Af Amer: 21 mL/min/{1.73_m2} — ABNORMAL LOW (ref >59)
Glucose: 106 mg/dL — ABNORMAL HIGH (ref 65–99)
Potassium: 5.4 mmol/L — ABNORMAL HIGH (ref 3.5–5.2)
Sodium: 138 mmol/L (ref 134–144)

## 2020-02-21 NOTE — Telephone Encounter (Signed)
I had left message for the patient with lab result/recommendations from Dr. Harrington Challenger.  When she didn't answer I called the patient's son who sent the MyChart message.  He will give patient the message to stop potassium and lisinopril and only take lasix 20 mg daily and that BMET is to be repeated on 7/19.  He discussed the possibility of amiodarone being the cause of her GFR change.  He wonders if there is a different antiarrythmic that would work for her.  He and his siblings are concerned about her renal function.  He is aware I will forward to Dr. Harrington Challenger and Dr. Lovena Le for review/input.

## 2020-02-22 NOTE — Telephone Encounter (Signed)
Please let son know that I do not think amiodarone is the cause for renal function declinie

## 2020-02-22 NOTE — Addendum Note (Signed)
Addended by: Rodman Key on: 02/22/2020 06:04 PM   Modules accepted: Orders

## 2020-02-22 NOTE — Telephone Encounter (Signed)
Sent to Dr. Harrington Challenger. Pt wrote that she is stopping Allopurinal and requested uric acid level done 7/19 w other labs. Ok w Dr. Harrington Challenger. Uric acid level ordered.

## 2020-02-26 ENCOUNTER — Other Ambulatory Visit: Payer: Self-pay

## 2020-02-26 ENCOUNTER — Other Ambulatory Visit: Payer: Medicare Other | Admitting: *Deleted

## 2020-02-26 DIAGNOSIS — R7989 Other specified abnormal findings of blood chemistry: Secondary | ICD-10-CM

## 2020-02-27 LAB — BASIC METABOLIC PANEL
BUN/Creatinine Ratio: 13 (ref 12–28)
BUN: 28 mg/dL — ABNORMAL HIGH (ref 8–27)
CO2: 24 mmol/L (ref 20–29)
Calcium: 9.2 mg/dL (ref 8.7–10.3)
Chloride: 102 mmol/L (ref 96–106)
Creatinine, Ser: 2.08 mg/dL — ABNORMAL HIGH (ref 0.57–1.00)
GFR calc Af Amer: 26 mL/min/{1.73_m2} — ABNORMAL LOW (ref >59)
GFR calc non Af Amer: 22 mL/min/{1.73_m2} — ABNORMAL LOW (ref >59)
Glucose: 113 mg/dL — ABNORMAL HIGH (ref 65–99)
Potassium: 4.7 mmol/L (ref 3.5–5.2)
Sodium: 139 mmol/L (ref 134–144)

## 2020-02-27 LAB — URIC ACID: Uric Acid: 9.9 mg/dL — ABNORMAL HIGH (ref 3.1–7.9)

## 2020-02-28 ENCOUNTER — Telehealth: Payer: Self-pay | Admitting: *Deleted

## 2020-02-28 NOTE — Telephone Encounter (Signed)
Pt reviewed results in MyChart. She will stop lasix 20 mg daily. Has already stopped lisinopril She stopped allopurinol 7 days ago because she read it can impair kidney function. Not having pain in toe which is why it was prescribed.  August 6 appt w PCP and hematologist and will have labs drawn at that time.  Will be viewable in [place]. Concerned about what to do w fluid. Still has swelling. Has lymph edema in BLE.  Adv to call w changes in weight or breathing or swelling.

## 2020-02-28 NOTE — Telephone Encounter (Signed)
-----   Message from [name] V, MD sent at 02/27/2020  3:05 PM EDT ----- Kidney function is a lttle better    Cr 2.08  Was 2.22.  4 months ago was 1.26 Cut out lasix. She should be off ACE inhibitor Forward lab to PCP ([facility]) for review Uric acid is elevated   Is she having joint inflammation, pain?

## 2020-03-13 ENCOUNTER — Telehealth: Payer: Self-pay | Admitting: Internal Medicine

## 2020-03-13 DIAGNOSIS — I5022 Chronic systolic (congestive) heart failure: Secondary | ICD-10-CM

## 2020-03-13 DIAGNOSIS — R0602 Shortness of breath: Secondary | ICD-10-CM

## 2020-03-13 DIAGNOSIS — R609 Edema, unspecified: Secondary | ICD-10-CM

## 2020-03-13 MED ORDER — FUROSEMIDE 20 MG PO TABS
20.0000 mg | ORAL_TABLET | ORAL | 1 refills | Status: DC
Start: 2020-03-13 — End: 2020-03-28

## 2020-03-13 NOTE — Telephone Encounter (Signed)
Pt wrote msg in to mychart that feet swelling again and SOB since she is off lasix   Pt should go back on lasix   Take every day for 2 days then take every other day as she had been  Please get appt in clinic with APP  Pt will need BMET and BNP in 7 days

## 2020-03-13 NOTE — Telephone Encounter (Signed)
Fay Records, MD 24 minutes ago (11:53 AM)     Pt wrote msg in to mychart that feet swelling again and SOB since she is off lasix   Pt should go back on lasix   Take every day for 2 days then take every other day as she had been  Please get appt in clinic with APP  Pt will need BMET and BNP in 7 days     Above copied from phone note from Dr Harrington Challenger

## 2020-03-13 NOTE — Telephone Encounter (Signed)
Instructions from Dr Harrington Challenger sent to patient through my chart.

## 2020-03-20 ENCOUNTER — Other Ambulatory Visit: Payer: Medicare Other

## 2020-03-22 NOTE — Telephone Encounter (Signed)
Dr. Harrington Challenger requested labs (BMET, BNP). Patient had labs at her PCP on 03/15/20.  Results are available in Care Everywhere.

## 2020-03-25 ENCOUNTER — Ambulatory Visit: Payer: Medicare Other | Admitting: Internal Medicine

## 2020-03-28 ENCOUNTER — Encounter: Payer: Self-pay | Admitting: Internal Medicine

## 2020-03-28 ENCOUNTER — Ambulatory Visit (INDEPENDENT_AMBULATORY_CARE_PROVIDER_SITE_OTHER): Payer: Medicare Other | Admitting: Internal Medicine

## 2020-03-28 ENCOUNTER — Other Ambulatory Visit: Payer: Self-pay

## 2020-03-28 VITALS — BP 153/65 | HR 70 | Ht 61.5 in | Wt 261.2 lb

## 2020-03-28 DIAGNOSIS — I2510 Atherosclerotic heart disease of native coronary artery without angina pectoris: Secondary | ICD-10-CM | POA: Diagnosis not present

## 2020-03-28 DIAGNOSIS — I5022 Chronic systolic (congestive) heart failure: Secondary | ICD-10-CM

## 2020-03-28 MED ORDER — TORSEMIDE 20 MG PO TABS
20.0000 mg | ORAL_TABLET | ORAL | 3 refills | Status: DC
Start: 2020-03-28 — End: 2020-08-21

## 2020-03-28 NOTE — Progress Notes (Signed)
Cardiology Office Note   Date:  03/28/2020   ID:  Lisa [name], DOB June 06, 1941, MRN [id number]  PCP:  [name], MD  Cardiologist:   [name], MD   F/U of CHF      History of Present Illness: Lisa [name] is a 79 y.o. 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  Cardiac catheterization was done.  This showed a 65% ramus narrowing.  LVEDP was moderately elevated at 23.[name]  LVEF was 50 to 55%.  She was seen by EP.  Placed on amiodarone.  I saw the pt in June  She was doing OK at the time Cr at that time was 1.8     Repeat showed Cr increased to 2.22 I recomm stopping ACE I and K as well as cutting back on lasix  On 7/20 Cr 2.08   I recomm cutting out lasix   Since then she has noticed a 20# wt gain.   Breathing is OK when she is sitting but when she moves around she is SOB   Labs on 8/6:  Cr 1.15   Hgb 8.5  BNP 448    Current Meds  Medication Sig  . acetaminophen (TYLENOL) 500 MG tablet Take 500-1,000 mg by mouth every 6 (six) hours as needed for moderate pain.  [name] allopurinol (ZYLOPRIM) 100 MG tablet Take 100 mg by mouth daily.   [name] amiodarone (PACERONE) 200 MG tablet Take 1 tablet (200 mg total) by mouth daily.  [name] aspirin EC 81 MG tablet Take 81 mg by mouth at bedtime.   [name] atorvastatin (LIPITOR) 20 MG tablet Take 1 tablet (20 mg total) by mouth daily.  . carvedilol (COREG) 25 MG tablet Take 1 tablet (25 mg total) by mouth 2 (two) times daily.  . Cholecalciferol  (VITAMIN D-3 PO) Take 5,000 Units by mouth daily.  [name] imatinib (GLEEVEC) 400 MG tablet Take 400 mg by mouth daily with supper.   . Inulin (METAMUCIL CLEAR & NATURAL PO) Take by mouth See admin instructions. Mix 1 teaspoonful into 8 ounces of water and drink once a day  . Magnesium Oxide 400 MG CAPS Take 1 capsule (400 mg total) by mouth 2 (two) times daily.  . Multiple Vitamin (MULTIVITAMIN WITH MINERALS) TABS tablet Take 1 tablet by mouth daily.  . nitroGLYCERIN (NITROSTAT) 0.4 MG SL tablet Place 1 tablet (0.4 mg total) under the tongue every 5 (five) minutes x 3 doses as needed for chest pain.  [name] ondansetron (ZOFRAN-ODT) 4 MG disintegrating tablet Take 4 mg by mouth every 8 (eight) hours as needed for nausea or vomiting (DISSOLVE IN THE MOUTH).   [name] thyroid (ARMOUR) 15 MG tablet Take 15 mg by mouth daily before breakfast.   . torsemide (DEMADEX) 20 MG tablet Take 20 mg by mouth daily.     Allergies:   Flagyl [metronidazole], Penicillins, Sulfamethoxazole, Meperidine, Morphine and related, Tape, Doxycycline, Lanolin, and Sulfa  antibiotics   Past Medical History:  Diagnosis Date  . CAD in native artery 2017   stent to LAD  . Cardiac arrest ([place]) 09/2015  . CHF (congestive heart failure) ([place])   . CML (chronic myelocytic leukemia) ([place])   . Complication of anesthesia   . Hx: UTI (urinary tract infection)   . Hypertension   . Melanoma ([place])   . PONV (postoperative nausea and vomiting)   . PVC (premature ventricular contraction)    HISTORY OF  . Thyroid disease   . V tach [facility])     Past Surgical History:  Procedure Laterality Date  . ABDOMINAL HYSTERECTOMY    . APPENDECTOMY    . CARDIAC CATHETERIZATION N/A 10/03/2015   Procedure: Left Heart Cath and Coronary Angiography;  Surgeon: [name], MD;  Location: [place] [facility];  Service: Cardiovascular;  Laterality: N/A;  . CARDIAC CATHETERIZATION N/A 10/07/2015   Procedure: Coronary Stent Intervention;  Surgeon: [name], MD;  Location: [place] [facility];  Service: Cardiovascular;  Laterality: N/A;  . CARDIAC CATHETERIZATION  09/22/2019  . EP IMPLANTABLE DEVICE N/A 01/20/2016   Procedure: BiV ICD Insertion CRT-D;  Surgeon: [name], MD;  Location: [place] [facility];  Service: Cardiovascular;  Laterality: N/A;  . LEFT HEART CATH AND CORONARY ANGIOGRAPHY N/A 09/22/2019   Procedure: LEFT HEART CATH AND CORONARY ANGIOGRAPHY;  Surgeon: Martinique, Peter M, MD;  Location: [place] [facility];  Service: Cardiovascular;  Laterality: N/A;  . TONSILLECTOMY    . TONSILLECTOMY    . TUBAL LIGATION       Social History:  The patient  reports that she has never smoked. She has never used smokeless tobacco. She reports that she does not drink alcohol and does not use drugs.   Family History:  The patient's family history includes Coronary artery disease in her mother; Heart attack in her maternal grandfather and paternal grandfather; Multiple myeloma in her father; Thyroid disease in her mother.    ROS:  Please see the history of present illness. All other systems are reviewed and  Negative to the above problem except as noted.    PHYSICAL EXAM: VS:  BP (!) 153/65   Pulse 70   Ht 5' 1.5" (1.562 m)   Wt 261 lb 3.2 oz (118.5 kg)   LMP 02/14/1995   SpO2 99%   BMI 48.55 kg/m   GEN: Morbidly obese 79 yo in no acute distress  HEENT: normal  Neck: JVP does not appear elevated  \Cardiac: RRR; no murmurs,    Chronic lymphedema of legs  Respiratory:  clear to auscultation bilaterally, normal work of breathing GI: soft, nontender, nondistended, + BS  No hepatomegaly   Skin: warm and dry, no rash Neuro:  Strength and sensation are intact Psych: euthymic mood, full affect   EKG:  EKG is not ordered today.   Lipid Panel    Component Value Date/Time   CHOL 92 (L) 10/06/2019 1059   TRIG 92 10/06/2019 1059   HDL 39 (L) 10/06/2019 1059   CHOLHDL 2.4 10/06/2019 1059   CHOLHDL 4.2 10/04/2015 0329   VLDL 18  10/04/2015 0329   LDLCALC 35 10/06/2019 1059      Wt Readings from Last 3 Encounters:  03/28/20 261 lb 3.2 oz (118.5 kg)  01/24/20 247 lb 9.6 oz (112.3 kg)  11/21/19 246 lb (111.6 kg)      ASSESSMENT AND PLAN:  1  Hx chronic systoiic CHF   Pt  now s/p CRT D  Echo in September shows LVEf is still normal  Also normal at cath  Labs from earlier in summer Cr increased (>2).  ACE I stopped and then diuretic sttopped  With that pt developedhad swelling and increased SOB   Diuretic added back (daily x 2 then qod)   Pt had labs at PCP   Cr 1.15 [facility])  I would recomm  torsemide 20 mg every other day   Follow up BMET in 1 wk   2   CAD PT had cath in February after a VT event.  No obstructive CAD noted.  Pt denies CP    3  Hx VF arrest   S/p Medtronic CRT-D.  Keep on amiodarone.    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  Has the device to follow.  5  HTN  BP is labile  Inreased today  I would follow    7.  CML.  Continues to follow with oncology    F/U in 2 months   Labs as noted    [name]  Current medicines are reviewed at length with the patient today.  The patient does not have concerns regarding medicines.  Signed, [name], MD  03/28/2020 10:22 AM    [facility] [place] [place], [place], [place]  [address] Phone: [phone]; Fax: [phone]

## 2020-03-28 NOTE — Patient Instructions (Signed)
Medication Instructions:  Your physician has recommended you make the following change in your medication:  1.) TORSEMIDE 20 MG --TAKE ONE TABLET EVERY OTHER DAY  *If you need a refill on your cardiac medications before your next appointment, please call your pharmacy*   Lab Work: Centre --BMET  Testing/Procedures: NONE   Follow-Up: At [facility], you and your health needs are our priority.  As part of our continuing mission to provide you with exceptional heart care, we have created designated Provider Care Teams.  These Care Teams include your primary Cardiologist (physician) and Advanced Practice Providers (APPs -  Physician Assistants and Nurse Practitioners) who all work together to provide you with the care you need, when you need it.   Your next appointment:   2 month(s)  The format for your next appointment:   In Person  Provider:   You may see [name], MD or one of the following Advanced Practice Providers on your designated Care Team:    [name], PA-C  [name], [place]   Other Instructions

## 2020-04-03 ENCOUNTER — Telehealth: Payer: Self-pay | Admitting: Internal Medicine

## 2020-04-03 DIAGNOSIS — E038 Other specified hypothyroidism: Secondary | ICD-10-CM

## 2020-04-03 DIAGNOSIS — R0602 Shortness of breath: Secondary | ICD-10-CM

## 2020-04-03 DIAGNOSIS — I5022 Chronic systolic (congestive) heart failure: Secondary | ICD-10-CM

## 2020-04-03 DIAGNOSIS — R609 Edema, unspecified: Secondary | ICD-10-CM

## 2020-04-03 NOTE — Telephone Encounter (Signed)
Patient called and wanted to Dr. Harrington Challenger nurse to call her back regarding other test that the patient's primary care, Dr. Gale Journey, wants her to take. She wants a phone call back once all tests has been added so that she can get them all at once.

## 2020-04-03 NOTE — Telephone Encounter (Signed)
The patient needs the following labs for her primary care doctor and would like to have them drawn at our office to include the labs for Dr. Harrington Challenger so that she only has to have one blood draw.  Erythropoietin Magnesium, phosphorus 25 OH vit D total Impact PTH BNP BMET  To Dr. Harrington Challenger to verify okay to order.  If not, I will call the patient back to let her know.  Her lab appointment is tomorrow 8/25

## 2020-04-03 NOTE — Telephone Encounter (Signed)
Done

## 2020-04-03 NOTE — Telephone Encounter (Signed)
OK to order 

## 2020-04-04 ENCOUNTER — Other Ambulatory Visit: Payer: Self-pay

## 2020-04-04 ENCOUNTER — Other Ambulatory Visit: Payer: Medicare Other | Admitting: *Deleted

## 2020-04-04 DIAGNOSIS — R0602 Shortness of breath: Secondary | ICD-10-CM

## 2020-04-04 DIAGNOSIS — E038 Other specified hypothyroidism: Secondary | ICD-10-CM

## 2020-04-04 DIAGNOSIS — I5022 Chronic systolic (congestive) heart failure: Secondary | ICD-10-CM

## 2020-04-04 DIAGNOSIS — R609 Edema, unspecified: Secondary | ICD-10-CM

## 2020-04-05 LAB — ERYTHROPOIETIN: Erythropoietin: 19.4 m[IU]/mL — ABNORMAL HIGH (ref 2.6–18.5)

## 2020-04-05 LAB — BASIC METABOLIC PANEL
BUN/Creatinine Ratio: 16 (ref 12–28)
BUN: 19 mg/dL (ref 8–27)
CO2: 24 mmol/L (ref 20–29)
Calcium: 8.5 mg/dL — ABNORMAL LOW (ref 8.7–10.3)
Chloride: 108 mmol/L — ABNORMAL HIGH (ref 96–106)
Creatinine, Ser: 1.21 mg/dL — ABNORMAL HIGH (ref 0.57–1.00)
GFR calc Af Amer: 50 mL/min/{1.73_m2} — ABNORMAL LOW (ref >59)
GFR calc non Af Amer: 43 mL/min/{1.73_m2} — ABNORMAL LOW (ref >59)
Glucose: 107 mg/dL — ABNORMAL HIGH (ref 65–99)
Potassium: 3.6 mmol/L (ref 3.5–5.2)
Sodium: 144 mmol/L (ref 134–144)

## 2020-04-05 LAB — MAGNESIUM: Magnesium: 1.8 mg/dL (ref 1.6–2.3)

## 2020-04-05 LAB — PRO B NATRIURETIC PEPTIDE: NT-Pro BNP: 1457 pg/mL — ABNORMAL HIGH (ref 0–738)

## 2020-04-05 LAB — SPECIMEN STATUS REPORT

## 2020-04-05 LAB — PARATHYROID HORMONE, INTACT (NO CA)

## 2020-04-05 LAB — VITAMIN D 25 HYDROXY (VIT D DEFICIENCY, FRACTURES): Vit D, 25-Hydroxy: 22.7 ng/mL — ABNORMAL LOW (ref 30.0–100.0)

## 2020-04-30 ENCOUNTER — Ambulatory Visit (INDEPENDENT_AMBULATORY_CARE_PROVIDER_SITE_OTHER): Payer: Medicare Other | Admitting: *Deleted

## 2020-04-30 DIAGNOSIS — I255 Ischemic cardiomyopathy: Secondary | ICD-10-CM | POA: Diagnosis not present

## 2020-04-30 LAB — CUP PACEART REMOTE DEVICE CHECK
Battery Remaining Longevity: 36 mo
Battery Voltage: 2.96 V
Brady Statistic AP VP Percent: 0.54 %
Brady Statistic AP VS Percent: 0.15 %
Brady Statistic AS VP Percent: 97.29 %
Brady Statistic AS VS Percent: 2.01 %
Brady Statistic RA Percent Paced: 0.69 %
Brady Statistic RV Percent Paced: 0.09 %
Date Time Interrogation Session: 20210921043722
HighPow Impedance: 58 Ohm
Implantable Lead Implant Date: 20170612
Implantable Lead Implant Date: 20170612
Implantable Lead Implant Date: 20170612
Implantable Lead Location: 753858
Implantable Lead Location: 753859
Implantable Lead Location: 753860
Implantable Lead Model: 4398
Implantable Lead Model: 5076
Implantable Pulse Generator Implant Date: 20170612
Lead Channel Impedance Value: 152 Ohm
Lead Channel Impedance Value: 193.455
Lead Channel Impedance Value: 193.455
Lead Channel Impedance Value: 206.72 Ohm
Lead Channel Impedance Value: 206.72 Ohm
Lead Channel Impedance Value: 285 Ohm
Lead Channel Impedance Value: 304 Ohm
Lead Channel Impedance Value: 304 Ohm
Lead Channel Impedance Value: 304 Ohm
Lead Channel Impedance Value: 456 Ohm
Lead Channel Impedance Value: 532 Ohm
Lead Channel Impedance Value: 551 Ohm
Lead Channel Impedance Value: 646 Ohm
Lead Channel Impedance Value: 722 Ohm
Lead Channel Impedance Value: 722 Ohm
Lead Channel Impedance Value: 817 Ohm
Lead Channel Impedance Value: 817 Ohm
Lead Channel Impedance Value: 893 Ohm
Lead Channel Pacing Threshold Amplitude: 1 V
Lead Channel Pacing Threshold Amplitude: 1 V
Lead Channel Pacing Threshold Amplitude: 2.125 V
Lead Channel Pacing Threshold Pulse Width: 0.4 ms
Lead Channel Pacing Threshold Pulse Width: 0.4 ms
Lead Channel Pacing Threshold Pulse Width: 0.8 ms
Lead Channel Sensing Intrinsic Amplitude: 3.75 mV
Lead Channel Sensing Intrinsic Amplitude: 3.75 mV
Lead Channel Sensing Intrinsic Amplitude: 4.25 mV
Lead Channel Sensing Intrinsic Amplitude: 4.25 mV
Lead Channel Setting Pacing Amplitude: 2 V
Lead Channel Setting Pacing Amplitude: 2.5 V
Lead Channel Setting Pacing Amplitude: 2.75 V
Lead Channel Setting Pacing Pulse Width: 0.4 ms
Lead Channel Setting Pacing Pulse Width: 0.8 ms
Lead Channel Setting Sensing Sensitivity: 0.3 mV

## 2020-05-01 ENCOUNTER — Encounter (HOSPITAL_COMMUNITY): Payer: Self-pay | Admitting: Emergency Medicine

## 2020-05-01 ENCOUNTER — Emergency Department (HOSPITAL_COMMUNITY): Payer: Medicare Other

## 2020-05-01 ENCOUNTER — Other Ambulatory Visit: Payer: Self-pay

## 2020-05-01 ENCOUNTER — Inpatient Hospital Stay (HOSPITAL_COMMUNITY)
Admission: EM | Admit: 2020-05-01 | Discharge: 2020-05-04 | DRG: 308 | Disposition: A | Discharge status: Home / Self Care | Payer: Medicare Other | Attending: Internal Medicine | Admitting: Internal Medicine

## 2020-05-01 ENCOUNTER — Observation Stay (HOSPITAL_COMMUNITY): Payer: Medicare Other

## 2020-05-01 DIAGNOSIS — E039 Hypothyroidism, unspecified: Secondary | ICD-10-CM | POA: Diagnosis present

## 2020-05-01 DIAGNOSIS — I130 Hypertensive heart and chronic kidney disease with heart failure and stage 1 through stage 4 chronic kidney disease, or unspecified chronic kidney disease: Secondary | ICD-10-CM | POA: Diagnosis present

## 2020-05-01 DIAGNOSIS — I9789 Other postprocedural complications and disorders of the circulatory system, not elsewhere classified: Secondary | ICD-10-CM | POA: Diagnosis present

## 2020-05-01 DIAGNOSIS — I361 Nonrheumatic tricuspid (valve) insufficiency: Secondary | ICD-10-CM | POA: Diagnosis not present

## 2020-05-01 DIAGNOSIS — C9210 Chronic myeloid leukemia, BCR/ABL-positive, not having achieved remission: Secondary | ICD-10-CM | POA: Diagnosis not present

## 2020-05-01 DIAGNOSIS — N289 Disorder of kidney and ureter, unspecified: Secondary | ICD-10-CM

## 2020-05-01 DIAGNOSIS — Z20822 Contact with and (suspected) exposure to covid-19: Secondary | ICD-10-CM | POA: Diagnosis present

## 2020-05-01 DIAGNOSIS — E785 Hyperlipidemia, unspecified: Secondary | ICD-10-CM | POA: Diagnosis present

## 2020-05-01 DIAGNOSIS — I462 Cardiac arrest due to underlying cardiac condition: Secondary | ICD-10-CM | POA: Diagnosis present

## 2020-05-01 DIAGNOSIS — E876 Hypokalemia: Secondary | ICD-10-CM

## 2020-05-01 DIAGNOSIS — I480 Paroxysmal atrial fibrillation: Secondary | ICD-10-CM | POA: Diagnosis present

## 2020-05-01 DIAGNOSIS — Z6841 Body Mass Index (BMI) 40.0 and over, adult: Secondary | ICD-10-CM | POA: Diagnosis not present

## 2020-05-01 DIAGNOSIS — I4901 Ventricular fibrillation: Secondary | ICD-10-CM | POA: Diagnosis present

## 2020-05-01 DIAGNOSIS — Z955 Presence of coronary angioplasty implant and graft: Secondary | ICD-10-CM

## 2020-05-01 DIAGNOSIS — R600 Localized edema: Secondary | ICD-10-CM | POA: Diagnosis not present

## 2020-05-01 DIAGNOSIS — E669 Obesity, unspecified: Secondary | ICD-10-CM | POA: Diagnosis not present

## 2020-05-01 DIAGNOSIS — N1832 Chronic kidney disease, stage 3b: Secondary | ICD-10-CM | POA: Diagnosis present

## 2020-05-01 DIAGNOSIS — I447 Left bundle-branch block, unspecified: Secondary | ICD-10-CM | POA: Diagnosis present

## 2020-05-01 DIAGNOSIS — Z91048 Other nonmedicinal substance allergy status: Secondary | ICD-10-CM | POA: Diagnosis not present

## 2020-05-01 DIAGNOSIS — I472 Ventricular tachycardia, unspecified: Secondary | ICD-10-CM

## 2020-05-01 DIAGNOSIS — I428 Other cardiomyopathies: Secondary | ICD-10-CM | POA: Diagnosis present

## 2020-05-01 DIAGNOSIS — N179 Acute kidney failure, unspecified: Secondary | ICD-10-CM | POA: Diagnosis present

## 2020-05-01 DIAGNOSIS — I2510 Atherosclerotic heart disease of native coronary artery without angina pectoris: Secondary | ICD-10-CM | POA: Diagnosis present

## 2020-05-01 DIAGNOSIS — I5022 Chronic systolic (congestive) heart failure: Secondary | ICD-10-CM | POA: Diagnosis not present

## 2020-05-01 DIAGNOSIS — Z882 Allergy status to sulfonamides status: Secondary | ICD-10-CM | POA: Diagnosis not present

## 2020-05-01 DIAGNOSIS — W19XXXA Unspecified fall, initial encounter: Secondary | ICD-10-CM | POA: Diagnosis present

## 2020-05-01 DIAGNOSIS — Z885 Allergy status to narcotic agent status: Secondary | ICD-10-CM | POA: Diagnosis not present

## 2020-05-01 DIAGNOSIS — Z79899 Other long term (current) drug therapy: Secondary | ICD-10-CM

## 2020-05-01 DIAGNOSIS — Z856 Personal history of leukemia: Secondary | ICD-10-CM

## 2020-05-01 DIAGNOSIS — Z7982 Long term (current) use of aspirin: Secondary | ICD-10-CM

## 2020-05-01 DIAGNOSIS — Z8349 Family history of other endocrine, nutritional and metabolic diseases: Secondary | ICD-10-CM

## 2020-05-01 DIAGNOSIS — Z8249 Family history of ischemic heart disease and other diseases of the circulatory system: Secondary | ICD-10-CM

## 2020-05-01 DIAGNOSIS — I5043 Acute on chronic combined systolic (congestive) and diastolic (congestive) heart failure: Secondary | ICD-10-CM | POA: Diagnosis present

## 2020-05-01 DIAGNOSIS — Z888 Allergy status to other drugs, medicaments and biological substances status: Secondary | ICD-10-CM

## 2020-05-01 DIAGNOSIS — I214 Non-ST elevation (NSTEMI) myocardial infarction: Secondary | ICD-10-CM

## 2020-05-01 DIAGNOSIS — S0003XA Contusion of scalp, initial encounter: Secondary | ICD-10-CM | POA: Diagnosis present

## 2020-05-01 DIAGNOSIS — I4729 Other ventricular tachycardia: Secondary | ICD-10-CM

## 2020-05-01 DIAGNOSIS — Z880 Allergy status to penicillin: Secondary | ICD-10-CM | POA: Diagnosis not present

## 2020-05-01 DIAGNOSIS — Z807 Family history of other malignant neoplasms of lymphoid, hematopoietic and related tissues: Secondary | ICD-10-CM

## 2020-05-01 DIAGNOSIS — Z9221 Personal history of antineoplastic chemotherapy: Secondary | ICD-10-CM

## 2020-05-01 DIAGNOSIS — I890 Lymphedema, not elsewhere classified: Secondary | ICD-10-CM | POA: Diagnosis present

## 2020-05-01 DIAGNOSIS — S0083XA Contusion of other part of head, initial encounter: Secondary | ICD-10-CM

## 2020-05-01 DIAGNOSIS — Z881 Allergy status to other antibiotic agents status: Secondary | ICD-10-CM | POA: Diagnosis not present

## 2020-05-01 DIAGNOSIS — R55 Syncope and collapse: Secondary | ICD-10-CM | POA: Diagnosis present

## 2020-05-01 DIAGNOSIS — Z79890 Hormone replacement therapy (postmenopausal): Secondary | ICD-10-CM

## 2020-05-01 DIAGNOSIS — R112 Nausea with vomiting, unspecified: Secondary | ICD-10-CM | POA: Diagnosis present

## 2020-05-01 DIAGNOSIS — R42 Dizziness and giddiness: Secondary | ICD-10-CM | POA: Diagnosis present

## 2020-05-01 DIAGNOSIS — I5023 Acute on chronic systolic (congestive) heart failure: Secondary | ICD-10-CM | POA: Diagnosis present

## 2020-05-01 DIAGNOSIS — Z95810 Presence of automatic (implantable) cardiac defibrillator: Secondary | ICD-10-CM | POA: Diagnosis not present

## 2020-05-01 DIAGNOSIS — D638 Anemia in other chronic diseases classified elsewhere: Secondary | ICD-10-CM | POA: Diagnosis present

## 2020-05-01 DIAGNOSIS — Z8674 Personal history of sudden cardiac arrest: Secondary | ICD-10-CM

## 2020-05-01 DIAGNOSIS — D631 Anemia in chronic kidney disease: Secondary | ICD-10-CM | POA: Diagnosis not present

## 2020-05-01 DIAGNOSIS — G4733 Obstructive sleep apnea (adult) (pediatric): Secondary | ICD-10-CM | POA: Diagnosis present

## 2020-05-01 DIAGNOSIS — I469 Cardiac arrest, cause unspecified: Secondary | ICD-10-CM | POA: Diagnosis not present

## 2020-05-01 DIAGNOSIS — D539 Nutritional anemia, unspecified: Secondary | ICD-10-CM

## 2020-05-01 DIAGNOSIS — I10 Essential (primary) hypertension: Secondary | ICD-10-CM | POA: Diagnosis present

## 2020-05-01 DIAGNOSIS — M542 Cervicalgia: Secondary | ICD-10-CM | POA: Diagnosis present

## 2020-05-01 LAB — CBC WITH DIFFERENTIAL/PLATELET
Abs Immature Granulocytes: 0.01 10*3/uL (ref 0.00–0.07)
Basophils Absolute: 0 10*3/uL (ref 0.0–0.1)
Basophils Relative: 0 %
Eosinophils Absolute: 0.3 10*3/uL (ref 0.0–0.5)
Eosinophils Relative: 4 %
HCT: 30.3 % — ABNORMAL LOW (ref 36.0–46.0)
Hemoglobin: 9.2 g/dL — ABNORMAL LOW (ref 12.0–15.0)
Immature Granulocytes: 0 %
Lymphocytes Relative: 23 %
Lymphs Abs: 1.7 10*3/uL (ref 0.7–4.0)
MCH: 31.2 pg (ref 26.0–34.0)
MCHC: 30.4 g/dL (ref 30.0–36.0)
MCV: 102.7 fL — ABNORMAL HIGH (ref 80.0–100.0)
Monocytes Absolute: 0.4 10*3/uL (ref 0.1–1.0)
Monocytes Relative: 6 %
Neutro Abs: 4.9 10*3/uL (ref 1.7–7.7)
Neutrophils Relative %: 67 %
Platelets: 174 10*3/uL (ref 150–400)
RBC: 2.95 MIL/uL — ABNORMAL LOW (ref 3.87–5.11)
RDW: 13.5 % (ref 11.5–15.5)
WBC: 7.3 10*3/uL (ref 4.0–10.5)
nRBC: 0 % (ref 0.0–0.2)

## 2020-05-01 LAB — URINALYSIS, ROUTINE W REFLEX MICROSCOPIC
Bilirubin Urine: NEGATIVE
Glucose, UA: NEGATIVE mg/dL
Ketones, ur: NEGATIVE mg/dL
Nitrite: NEGATIVE
Protein, ur: NEGATIVE mg/dL
Specific Gravity, Urine: 1.018 (ref 1.005–1.030)
pH: 5 (ref 5.0–8.0)

## 2020-05-01 LAB — TROPONIN I (HIGH SENSITIVITY)
Troponin I (High Sensitivity): 51 ng/L — ABNORMAL HIGH (ref <18)
Troponin I (High Sensitivity): 190 ng/L (ref <18)

## 2020-05-01 LAB — BASIC METABOLIC PANEL
Anion gap: 12 (ref 5–15)
BUN: 18 mg/dL (ref 8–23)
CO2: 24 mmol/L (ref 22–32)
Calcium: 8.9 mg/dL (ref 8.9–10.3)
Chloride: 105 mmol/L (ref 98–111)
Creatinine, Ser: 1.32 mg/dL — ABNORMAL HIGH (ref 0.44–1.00)
GFR calc Af Amer: 45 mL/min — ABNORMAL LOW (ref >60)
GFR calc non Af Amer: 39 mL/min — ABNORMAL LOW (ref >60)
Glucose, Bld: 145 mg/dL — ABNORMAL HIGH (ref 70–99)
Potassium: 3.3 mmol/L — ABNORMAL LOW (ref 3.5–5.1)
Sodium: 141 mmol/L (ref 135–145)

## 2020-05-01 LAB — ECHOCARDIOGRAM COMPLETE
Area-P 1/2: 2.76 cm2
Height: 61.5 in
S' Lateral: 4.1 cm
Single Plane A4C EF: 45.6 %
Weight: 4096 oz

## 2020-05-01 LAB — SARS CORONAVIRUS 2 BY RT PCR (HOSPITAL ORDER, PERFORMED IN ~~LOC~~ HOSPITAL LAB): SARS Coronavirus 2: NEGATIVE

## 2020-05-01 LAB — MAGNESIUM: Magnesium: 1.7 mg/dL (ref 1.7–2.4)

## 2020-05-01 MED ORDER — ALLOPURINOL 100 MG PO TABS
100.0000 mg | ORAL_TABLET | Freq: Every day | ORAL | Status: DC
  Administered 2020-05-02 – 2020-05-04 (×3): 100 mg via ORAL
  Filled 2020-05-01 (×4): qty 1

## 2020-05-01 MED ORDER — AMIODARONE HCL 200 MG PO TABS: 400.0000 mg | ORAL_TABLET | Freq: Two times a day (BID) | ORAL | Status: DC

## 2020-05-01 MED ORDER — ATORVASTATIN CALCIUM 10 MG PO TABS
20.0000 mg | ORAL_TABLET | Freq: Every day | ORAL | Status: DC
  Administered 2020-05-02 – 2020-05-04 (×3): 20 mg via ORAL
  Filled 2020-05-01 (×4): qty 2

## 2020-05-01 MED ORDER — ONDANSETRON 4 MG PO TBDP: 8.0000 mg | ORAL_TABLET | Freq: Once | ORAL | Status: DC

## 2020-05-01 MED ORDER — AMIODARONE HCL 200 MG PO TABS: 200.0000 mg | ORAL_TABLET | Freq: Every day | ORAL | Status: DC

## 2020-05-01 MED ORDER — HEPARIN (PORCINE) 25000 UT/250ML-% IV SOLN
1100.0000 [IU]/h | INTRAVENOUS | Status: DC
  Administered 2020-05-01: 1100 [IU]/h via INTRAVENOUS
  Filled 2020-05-01: qty 250

## 2020-05-01 MED ORDER — THYROID 30 MG PO TABS
15.0000 mg | ORAL_TABLET | Freq: Every day | ORAL | Status: DC
  Administered 2020-05-02 – 2020-05-04 (×3): 15 mg via ORAL
  Filled 2020-05-01 (×6): qty 1

## 2020-05-01 MED ORDER — ACETAMINOPHEN 325 MG PO TABS
650.0000 mg | ORAL_TABLET | Freq: Once | ORAL | Status: AC
  Administered 2020-05-01: 650 mg via ORAL
  Filled 2020-05-01: qty 2

## 2020-05-01 MED ORDER — HEPARIN BOLUS VIA INFUSION
4000.0000 [IU] | Freq: Once | INTRAVENOUS | Status: AC
  Administered 2020-05-01: 4000 [IU] via INTRAVENOUS
  Filled 2020-05-01: qty 4000

## 2020-05-01 MED ORDER — AMIODARONE HCL IN DEXTROSE 360-4.14 MG/200ML-% IV SOLN
30.0000 mg/h | INTRAVENOUS | Status: AC
  Administered 2020-05-02 – 2020-05-03 (×2): 30 mg/h via INTRAVENOUS
  Filled 2020-05-01 (×4): qty 200

## 2020-05-01 MED ORDER — AMIODARONE LOAD VIA INFUSION
150.0000 mg | Freq: Once | INTRAVENOUS | Status: AC
  Administered 2020-05-01: 150 mg via INTRAVENOUS
  Filled 2020-05-01: qty 83.34

## 2020-05-01 MED ORDER — AMIODARONE HCL IN DEXTROSE 360-4.14 MG/200ML-% IV SOLN
60.0000 mg/h | INTRAVENOUS | Status: AC
  Administered 2020-05-01: 60 mg/h via INTRAVENOUS
  Filled 2020-05-01 (×3): qty 200

## 2020-05-01 MED ORDER — ACETAMINOPHEN 325 MG PO TABS
650.0000 mg | ORAL_TABLET | Freq: Four times a day (QID) | ORAL | Status: DC | PRN
  Administered 2020-05-01: 650 mg via ORAL
  Filled 2020-05-01: qty 2

## 2020-05-01 MED ORDER — POTASSIUM CHLORIDE CRYS ER 20 MEQ PO TBCR: 40.0000 meq | EXTENDED_RELEASE_TABLET | Freq: Once | ORAL | Status: DC

## 2020-05-01 MED ORDER — ONDANSETRON HCL 4 MG/2ML IJ SOLN
4.0000 mg | Freq: Once | INTRAMUSCULAR | Status: AC
  Administered 2020-05-01: 4 mg via INTRAVENOUS
  Filled 2020-05-01: qty 2

## 2020-05-01 MED ORDER — MAGNESIUM SULFATE 2 GM/50ML IV SOLN
2.0000 g | Freq: Once | INTRAVENOUS | Status: AC
  Administered 2020-05-01: 2 g via INTRAVENOUS
  Filled 2020-05-01: qty 50

## 2020-05-01 MED ORDER — CARVEDILOL 25 MG PO TABS
25.0000 mg | ORAL_TABLET | Freq: Two times a day (BID) | ORAL | Status: DC
  Administered 2020-05-01 – 2020-05-04 (×6): 25 mg via ORAL
  Filled 2020-05-01 (×7): qty 1

## 2020-05-01 MED ORDER — POTASSIUM CHLORIDE 20 MEQ/15ML (10%) PO SOLN
40.0000 meq | Freq: Every day | ORAL | Status: DC
  Administered 2020-05-01 (×2): 40 meq via ORAL
  Filled 2020-05-01 (×2): qty 30

## 2020-05-01 MED ORDER — TORSEMIDE 20 MG PO TABS
20.0000 mg | ORAL_TABLET | ORAL | Status: DC
  Filled 2020-05-01 (×2): qty 1

## 2020-05-01 MED ORDER — IMATINIB MESYLATE 400 MG PO TABS
400.0000 mg | ORAL_TABLET | Freq: Every day | ORAL | Status: DC
  Administered 2020-05-01: 400 mg via ORAL

## 2020-05-01 MED ORDER — ONDANSETRON HCL 4 MG PO TABS: 4.0000 mg | ORAL_TABLET | Freq: Four times a day (QID) | ORAL | Status: DC | PRN

## 2020-05-01 MED ORDER — ACETAMINOPHEN 650 MG RE SUPP: 650.0000 mg | Freq: Four times a day (QID) | RECTAL | Status: DC | PRN

## 2020-05-01 MED ORDER — ASPIRIN EC 81 MG PO TBEC
81.0000 mg | DELAYED_RELEASE_TABLET | Freq: Every day | ORAL | Status: DC
  Filled 2020-05-01: qty 1

## 2020-05-01 MED ORDER — ASPIRIN 81 MG PO CHEW
324.0000 mg | CHEWABLE_TABLET | Freq: Once | ORAL | Status: AC
  Administered 2020-05-01: 324 mg via ORAL
  Filled 2020-05-01: qty 4

## 2020-05-01 MED ORDER — ONDANSETRON HCL 4 MG/2ML IJ SOLN: 4.0000 mg | Freq: Four times a day (QID) | INTRAMUSCULAR | Status: DC | PRN

## 2020-05-01 NOTE — Progress Notes (Signed)
ANTICOAGULATION CONSULT NOTE - Initial Consult  Pharmacy Consult for heparin Indication: chest pain/ACS  Allergies  Allergen Reactions  . Flagyl [Metronidazole] Itching, Rash and Other (See Comments)    Welts, also  . Penicillins Hives, Itching and Rash    Has patient had a PCN reaction causing immediate rash, facial/tongue/throat swelling, SOB or lightheadedness with hypotension: Yes  Has patient had a PCN reaction causing severe rash involving mucus membranes or skin necrosis: Yes Has patient had a PCN reaction that required hospitalization No Has patient had a PCN reaction occurring within the last 10 years: NO If all of the above answers are "NO", then may proceed with Cephalosporin use.   . Sulfamethoxazole Rash  . Meperidine Nausea And Vomiting  . Morphine And Related Nausea And Vomiting  . Tape Other (See Comments)    "THE PLASTIC, CLEAR TAPE CAN/DOES PULL OFF MY SKIN."  . Doxycycline Nausea Only, Rash and Other (See Comments)    Made her "feel terrible"  . Lanolin Itching and Rash  . Sulfa Antibiotics Rash    Patient Measurements: Height: 5' 1.5" (156.2 cm) Weight: 116.1 kg (256 lb) IBW/kg (Calculated) : 48.95 Heparin Dosing Weight: 80kg  Vital Signs: Temp: 97.5 F (36.4 C) (09/22 0230) Temp Source: Oral (09/22 0230) BP: 120/49 (09/22 0600) Pulse Rate: 70 (09/22 0600)  Labs: Recent Labs    05/01/20 0257 05/01/20 0536  HGB 9.2*  --   HCT 30.3*  --   PLT 174  --   CREATININE 1.32*  --   TROPONINIHS 51* 190*    Estimated Creatinine Clearance: 42 mL/min (A) (by C-G formula based on SCr of 1.32 mg/dL (H)).   Medical History: Past Medical History:  Diagnosis Date  . CAD in native artery 2017   stent to LAD  . Cardiac arrest ([place]) 09/2015  . CHF (congestive heart failure) ([place])   . CML (chronic myelocytic leukemia) ([place])   . Complication of anesthesia   . Hx: UTI (urinary tract infection)   . Hypertension   . Melanoma ([place])   . PONV (postoperative  nausea and vomiting)   . PVC (premature ventricular contraction)    HISTORY OF  . Thyroid disease   . V tach [facility])     Assessment: 79yo female presents after syncopal episode followed by N/V and diaphoresis, troponin found to be elevated, to begin heparin.  Goal of Therapy:  Heparin level 0.3-0.7 units/ml Monitor platelets by anticoagulation protocol: Yes   Plan:  Will give heparin 4000 units IV bolus x1 followed by gtt at 1100 units/hr and monitor heparin levels and CBC.  Wynona Neat, PharmD, BCPS  05/01/2020,7:12 AM

## 2020-05-01 NOTE — ED Provider Notes (Signed)
[facility] EMERGENCY DEPARTMENT Provider Note   CSN: [id number] Arrival date & time: 05/01/20  2376   History Chief Complaint  Patient presents with  . Loss of Consciousness  . Fall    Lisa [name] is a 79 y.o. female.  The history is provided by the patient.  Loss of Consciousness Fall  She has history of hypertension, coronary artery disease, ventricular tachycardia status post ICD insertion, chronic systolic heart failure, lymphedema and comes in following a syncopal episode.  She got up and sat on the edge of her bed and noted extreme dizziness.  She then passed out and apparently hit her head on something.  She woke up on the floor and noted a knot on the left side of her forehead.  She then developed nausea and vomiting and intense diaphoresis.  She vomited multiple times.  She denies chest pain, heaviness, tightness, pressure.  She denies any palpitations and denies discharge of her defibrillator.  He had a similar episode once before with syncope but not vomiting and diaphoresis.  Past Medical History:  Diagnosis Date  . CAD in native artery 2017   stent to LAD  . Cardiac arrest ([place]) 09/2015  . CHF (congestive heart failure) ([place])   . CML (chronic myelocytic leukemia) ([place])   . Complication of anesthesia   . Hx: UTI (urinary tract infection)   . Hypertension   . Melanoma ([place])   . PONV (postoperative nausea and vomiting)   . PVC (premature ventricular contraction)    HISTORY OF  . Thyroid disease   . V tach [facility])     Patient Active Problem List   Diagnosis Date Noted  . Sustained VT (ventricular tachycardia) ([place]) 09/21/2019  . 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 Acute bronchitis 11/02/2016  . Unstable angina ([place]) 10/07/2015  . Chronic systolic heart failure ([place])   . PVC's (premature ventricular contractions)   . Ventricular fibrillation ([place]) 10/03/2015  . Cardiac arrest ([place]) 10/03/2015  . Essential  hypertension 09/09/2015  . Lymphedema 02/21/2015  . Other specified hypothyroidism 02/21/2015    Past Surgical History:  Procedure Laterality Date  . ABDOMINAL HYSTERECTOMY    . APPENDECTOMY    . CARDIAC CATHETERIZATION N/A 10/03/2015   Procedure: Left Heart Cath and Coronary Angiography;  Surgeon: [name], MD;  Location: [facility];  Service: Cardiovascular;  Laterality: N/A;  . CARDIAC CATHETERIZATION N/A 10/07/2015   Procedure: Coronary Stent Intervention;  Surgeon: [name], MD;  Location: [facility];  Service: Cardiovascular;  Laterality: N/A;  . CARDIAC CATHETERIZATION  09/22/2019  . EP IMPLANTABLE DEVICE N/A 01/20/2016   Procedure: BiV ICD Insertion CRT-D;  Surgeon: [name], MD;  Location: [facility];  Service: Cardiovascular;  Laterality: N/A;  . LEFT HEART CATH AND CORONARY ANGIOGRAPHY N/A 09/22/2019   Procedure: LEFT HEART CATH AND CORONARY ANGIOGRAPHY;  Surgeon: Martinique, Peter M, MD;  Location: [facility];  Service: Cardiovascular;  Laterality: N/A;  . TONSILLECTOMY    . TONSILLECTOMY    . TUBAL LIGATION       OB History   No obstetric history on file.     Family History  Problem Relation Age of Onset  . Coronary artery disease Mother   . Thyroid disease Mother   . Multiple myeloma Father   . Heart attack Maternal Grandfather   . Heart attack Paternal Grandfather     Social History   Tobacco Use  .  Smoking status: Never Smoker  . Smokeless tobacco: Never Used  Vaping Use  . Vaping Use: Never used  Substance Use Topics  . Alcohol use: No    Alcohol/week: 0.0 standard drinks  . Drug use: No    Home Medications Prior to Admission medications   Medication Sig Start Date End Date Taking? Authorizing Provider  acetaminophen (TYLENOL) 500 MG tablet Take 500-1,000 mg by mouth every 6 (six) hours as needed for moderate pain.    [provider]  allopurinol (ZYLOPRIM) 100 MG tablet Take 100 mg by  mouth daily.  06/15/17   [provider]  amiodarone (PACERONE) 200 MG tablet Take 1 tablet (200 mg total) by mouth daily. 12/01/19   [name], MD  aspirin EC 81 MG tablet Take 81 mg by mouth at bedtime.     [provider]  atorvastatin (LIPITOR) 20 MG tablet Take 1 tablet (20 mg total) by mouth daily. 05/07/16   Bensimhon, Shaune Pascal, MD  carvedilol (COREG) 25 MG tablet Take 1 tablet (25 mg total) by mouth 2 (two) times daily. 01/24/20   Fay Records, MD  Cholecalciferol (VITAMIN D-3 PO) Take 5,000 Units by mouth daily.    [provider]  imatinib (GLEEVEC) 400 MG tablet Take 400 mg by mouth daily with supper.  02/16/17   [provider]  Inulin (METAMUCIL CLEAR & NATURAL PO) Take by mouth See admin instructions. Mix 1 teaspoonful into 8 ounces of water and drink once a day    [provider]  Magnesium Oxide 400 MG CAPS Take 1 capsule (400 mg total) by mouth 2 (two) times daily. 10/18/19   Baldwin Jamaica, PA-C  Multiple Vitamin (MULTIVITAMIN WITH MINERALS) TABS tablet Take 1 tablet by mouth daily.    [provider]  nitroGLYCERIN (NITROSTAT) 0.4 MG SL tablet Place 1 tablet (0.4 mg total) under the tongue every 5 (five) minutes x 3 doses as needed for chest pain. 02/28/18   [name], MD  ondansetron (ZOFRAN-ODT) 4 MG disintegrating tablet Take 4 mg by mouth every 8 (eight) hours as needed for nausea or vomiting (DISSOLVE IN THE MOUTH).  06/02/19   [provider]  thyroid (ARMOUR) 15 MG tablet Take 15 mg by mouth daily before breakfast.     [provider]  torsemide (DEMADEX) 20 MG tablet Take 1 tablet (20 mg total) by mouth every other day. 03/28/20   Fay Records, MD    Allergies    Flagyl [metronidazole], Penicillins, Sulfamethoxazole, Meperidine, Morphine and related, Tape, Doxycycline, Lanolin, and Sulfa antibiotics  Review of Systems   Review of Systems  Cardiovascular: Positive for syncope.  All other  systems reviewed and are negative.   Physical Exam Updated Vital Signs BP (!) 154/62 (BP Location: Right Arm)   Pulse 77   Temp (!) 97.5 F (36.4 C) (Oral)   Resp 12   LMP 02/14/1995   SpO2 97%   Physical Exam Vitals and nursing note reviewed.   79 year old female, resting comfortably and in no acute distress. Vital signs are significant for elevated blood pressure. Oxygen saturation is 97%, which is normal. Head is normocephalic.  Hematoma and superficial laceration noted on left side of the forehead.  Laceration does not require closure.[name] PERRLA, EOMI. Oropharynx is clear. Neck is immobilized in a stiff cervical collar and is nontender without adenopathy or JVD. Back is nontender and there is no CVA tenderness. Lungs are clear without rales, wheezes, or  rhonchi. Chest is nontender. Heart has regular rate and rhythm without murmur. Abdomen is soft, flat, nontender without masses or hepatosplenomegaly and peristalsis is normoactive. Extremities have severe lymphedema. Skin is warm and dry without rash. Neurologic: Mental status is normal, cranial nerves are intact, there are no motor or sensory deficits.   ED Results / Procedures / Treatments   Labs (all labs ordered are listed, but only abnormal results are displayed) Labs Reviewed  BASIC METABOLIC PANEL - Abnormal; Notable for the following components:      Result Value   Potassium 3.3 (*)    Glucose, Bld 145 (*)    Creatinine, Ser 1.32 (*)    GFR calc non Af Amer 39 (*)    GFR calc Af Amer 45 (*)    All other components within normal limits  CBC WITH DIFFERENTIAL/PLATELET - Abnormal; Notable for the following components:   RBC 2.95 (*)    Hemoglobin 9.2 (*)    HCT 30.3 (*)    MCV 102.7 (*)    All other components within normal limits  TROPONIN I (HIGH SENSITIVITY) - Abnormal; Notable for the following components:   Troponin I (High Sensitivity) 51 (*)    All other components within normal limits  TROPONIN I (HIGH  SENSITIVITY) - Abnormal; Notable for the following components:   Troponin I (High Sensitivity) 190 (*)    All other components within normal limits  SARS CORONAVIRUS 2 BY RT PCR (HOSPITAL ORDER, [place])  HEPARIN LEVEL (UNFRACTIONATED)    EKG EKG Interpretation  Date/Time:  Wednesday May 01 2020 03:05:44 EDT Ventricular Rate:  72 PR Interval:    QRS Duration: 114 QT Interval:  493 QTC Calculation: 540 R Axis:   -84 Text Interpretation: Atrial-sensed ventricular-paced rhythm Consider left atrial enlargement LAD, consider left anterior fascicular block Probable lateral infarct, old Prolonged QT interval When compared with ECG of 09/21/2019, No significant change was found Confirmed by Delora Fuel ([id number]) on 05/01/2020 3:14:05 AM   Radiology CT Head Wo Contrast  Result Date: 05/01/2020 CLINICAL DATA:  Syncopal episode with forehead hematoma EXAM: CT HEAD WITHOUT CONTRAST CT CERVICAL SPINE WITHOUT CONTRAST TECHNIQUE: Multidetector CT imaging of the head and cervical spine was performed following the standard protocol without intravenous contrast. Multiplanar CT image reconstructions of the cervical spine were also generated. COMPARISON:  None. FINDINGS: CT HEAD FINDINGS Brain: No evidence of acute infarction, hemorrhage, hydrocephalus, extra-axial collection or mass lesion/mass effect. Mild white matter low-density for age. Normal brain volume Vascular: No hyperdense vessel or unexpected calcification. Skull: Left lateral scalp injury with contusion and signs of laceration. No acute fracture Sinuses/Orbits: No evidence of injury CT CERVICAL SPINE FINDINGS Alignment: Normal. Skull base and vertebrae: No acute fracture. No primary bone lesion or focal pathologic process. Soft tissues and spinal canal: No prevertebral fluid or swelling. No visible canal hematoma. Disc levels:  Ordinary degenerative changes for age Upper chest: No evidence of injury IMPRESSION: Scalp  hematoma without calvarial fracture. No evidence of intracranial or cervical spine injury. Electronically Signed   By: Monte Fantasia M.D.   On: 05/01/2020 04:24   CT Cervical Spine Wo Contrast  Result Date: 05/01/2020 CLINICAL DATA:  Syncopal episode with forehead hematoma EXAM: CT HEAD WITHOUT CONTRAST CT CERVICAL SPINE WITHOUT CONTRAST TECHNIQUE: Multidetector CT imaging of the head and cervical spine was performed following the standard protocol without intravenous contrast. Multiplanar CT image reconstructions of the cervical spine were also generated. COMPARISON:  None. FINDINGS: CT  HEAD FINDINGS Brain: No evidence of acute infarction, hemorrhage, hydrocephalus, extra-axial collection or mass lesion/mass effect. Mild white matter low-density for age. Normal brain volume Vascular: No hyperdense vessel or unexpected calcification. Skull: Left lateral scalp injury with contusion and signs of laceration. No acute fracture Sinuses/Orbits: No evidence of injury CT CERVICAL SPINE FINDINGS Alignment: Normal. Skull base and vertebrae: No acute fracture. No primary bone lesion or focal pathologic process. Soft tissues and spinal canal: No prevertebral fluid or swelling. No visible canal hematoma. Disc levels:  Ordinary degenerative changes for age Upper chest: No evidence of injury IMPRESSION: Scalp hematoma without calvarial fracture. No evidence of intracranial or cervical spine injury. Electronically Signed   By: Monte Fantasia M.D.   On: 05/01/2020 04:24   CUP PACEART REMOTE DEVICE CHECK  Result Date: 04/30/2020 Scheduled remote reviewed. Normal device function. 3 NSVT, longest 6 sec with rates >200bpm falling into VF zone (all in September). On Amiodarone Next remote 91 days- [employer], RN/CVRS   Procedures Procedures  CRITICAL CARE Performed by: Delora Fuel Total critical care time: 35 minutes Critical care time was exclusive of separately billable procedures and treating other patients. Critical  care was necessary to treat or prevent imminent or life-threatening deterioration. Critical care was time spent personally by me on the following activities: development of treatment plan with patient and/or surrogate as well as nursing, discussions with consultants, evaluation of patient's response to treatment, examination of patient, obtaining history from patient or surrogate, ordering and performing treatments and interventions, ordering and review of laboratory studies, ordering and review of radiographic studies, pulse oximetry and re-evaluation of patient's condition.  Medications Ordered in ED Medications  potassium chloride 20 MEQ/15ML (10%) solution 40 mEq (40 mEq Oral Given 05/01/20 0527)  aspirin chewable tablet 324 mg (has no administration in time range)  heparin bolus via infusion 4,000 Units (has no administration in time range)  heparin ADULT infusion 100 units/mL (25000 units/243m sodium chloride 0.45%) (has no administration in time range)  ondansetron (ZOFRAN) injection 4 mg (4 mg Intravenous Given 05/01/20 0300)  acetaminophen (TYLENOL) tablet 650 mg (650 mg Oral Given 05/01/20 0536)    ED Course  I have reviewed the triage vital signs and the nursing notes.  Pertinent labs & imaging results that were available during my care of the patient were reviewed by me and considered in my medical decision making (see chart for details).  MDM Rules/Calculators/A&P Syncope followed by nausea and vomiting.  Old records are reviewed confirming that she is being followed by cardiology following placement of AICD.  She is sent for CT of head and cervical spine, will also check electrolytes and cardiac enzymes.  ECG shows atrial sensing ventricular pacing.  4:47 AM CT scans showed no acute injury.  Labs are significant for mild hypokalemia.  Anemia and renal insufficiency are not significantly changed from baseline.  Troponin is mildly elevated and it appears based on old records, this is  approximately the range that her troponin stays in.  We will need to keep in the ED for a delta troponin.  7:23 AM Repeat troponin has gone up significantly, to 190.  She is given aspirin and started on heparin.  Cardiology will be consulted for admission.  Final Clinical Impression(s) / ED Diagnoses Final diagnoses:  Syncope and collapse  Non-STEMI (non-ST elevated myocardial infarction) ([place]  Forehead contusion, initial encounter  Renal insufficiency  Macrocytic anemia    Rx / DC Orders ED Discharge Orders    None  Delora Fuel, MD 53/29/92 [phone]

## 2020-05-01 NOTE — ED Notes (Signed)
Patient placed on 2L via [place] due to drop in oxygen sats while sleeping.

## 2020-05-01 NOTE — H&P (Addendum)
Date: 05/01/2020               Patient Name:  Lisa [name] MRN: [id number]  DOB: August 25, 1940 Age / Sex: 79 y.o., female   PCP: [name], MD         Medical Service: Internal Medicine Teaching Service         Attending Physician: Dr. Evette Doffing, Mallie Mussel, *    First Contact: Dr. Lisabeth Devoid Pager: [phone]  Second Contact: Dr. 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   Patient reports that this morning she was sitting up on the side of her bed and she developed extreme dizziness, she felt very nauseous and had passed out and hit her head on an unknown object.  She then woke up on the floor and noted on the side of her forehead, then started having nausea, vomiting, and sweating.  She denied any chest pain, palpitations, headaches, SOB, fevers, chills, diarrhea, dysuria, or other symptoms.  Patient reports that last night she was sitting on the side of her bed and she suddenly became very dizzy, and fell forward.  She thinks she may have hit her head on her bedside table.[name]  She was down for an unknown period of time and woke up on the floor, she did have some difficulty getting up and had to crawl to get to the bedside.  When she finally got up on the bed she developed nausea, nonbilious nonbloody emesis, and started shaking so she called 911.  She denied any fevers, chills, chest pain, shortness of breath, abdominal pain, headaches, or other symptoms.  She denied any tongue biting, confusion, and does not know she had urinary incontinence.  She reports always wearing a pad due to occasionally  having urinary dribbling.  She reports having shortness of breath with exertion, this is chronic, denies any shortness of breath at rest.  She states that she was reading and watching TV, no strenuous activity before going to bed.  She felt perfectly well before this occurred.  She reports that these episodes are very similar to when she previously had firing of her AICD, she states that she did not feel the ICD go off this time.  In the ER patient was noted to be afebrile, vital signs stable.  BMP showed potassium 3.3, creatinine 1.3(seems to be in near baseline).  CBC showed WBC 7.3, hemoglobin 9.2, platelet 174.  CT head and cervical spine showed scalp hematoma without calvarial fracture, no intracranial or cervical spine injury.  EKG showed atrial sensed ventricular paced rhythm, no acute ST changes, no changes from prior EKG.  Initial troponin was 51, repeat elevated at 190.  Patient started on heparin drip and cardiology was consulted.  Patient admitted to [facility] for further management.  Meds:  Current Meds  Medication Sig  . acetaminophen (TYLENOL) 500 MG tablet Take 500-1,000 mg by mouth every 6 (six) hours as needed for moderate pain.  [name] allopurinol (ZYLOPRIM) 100 MG tablet Take 100 mg by mouth daily.   [name] amiodarone (PACERONE) 200 MG tablet Take 1 tablet (200  mg total) by mouth daily. (Patient taking differently: Take 200 mg by mouth at bedtime. )  . aspirin EC 81 MG tablet Take 81 mg by mouth at bedtime.   [name] atorvastatin (LIPITOR) 20 MG tablet Take 1 tablet (20 mg total) by mouth daily.  . carvedilol (COREG) 25 MG tablet Take 1 tablet (25 mg total) by mouth 2 (two) times daily.  . Cholecalciferol (VITAMIN D-3 PO) Take 5,000 Units by mouth daily.  [name] imatinib (GLEEVEC) 400 MG tablet Take 400 mg by mouth daily with supper.   . Inulin (METAMUCIL CLEAR & NATURAL PO) Take by mouth See admin instructions. Mix 1 teaspoonful into 8 ounces of water and drink once a day  . Magnesium Oxide 400 MG CAPS  Take 1 capsule (400 mg total) by mouth 2 (two) times daily.  . Multiple Vitamin (MULTIVITAMIN WITH MINERALS) TABS tablet Take 1 tablet by mouth daily.  . nitroGLYCERIN (NITROSTAT) 0.4 MG SL tablet Place 1 tablet (0.4 mg total) under the tongue every 5 (five) minutes x 3 doses as needed for chest pain.  [name] thyroid (ARMOUR) 15 MG tablet Take 15 mg by mouth daily before breakfast.   . torsemide (DEMADEX) 20 MG tablet Take 1 tablet (20 mg total) by mouth every other day.     Allergies: Allergies as of 05/01/2020 - Review Complete 05/01/2020  Allergen Reaction Noted  . Flagyl [metronidazole] Itching, Rash, and Other (See Comments) 02/26/2012  . Penicillins Hives, Itching, and Rash 02/26/2012  . Sulfamethoxazole Rash 10/18/2015  . Meperidine Nausea And Vomiting 10/18/2015  . Morphine and related Nausea And Vomiting 04/28/2016  . Tape Other (See Comments) 09/21/2019  . Doxycycline Nausea Only, Rash, and Other (See Comments) 10/04/2015  . Lanolin Itching and Rash 03/08/2012  . Sulfa antibiotics Rash 06/05/2019   Past Medical History:  Diagnosis Date  . CAD in native artery 2017   stent to LAD  . Cardiac arrest ([place]) 09/2015  . CHF (congestive heart failure) ([place])   . CML (chronic myelocytic leukemia) ([place])   . Complication of anesthesia   . Hx: UTI (urinary tract infection)   . Hypertension   . Melanoma ([place])   . PONV (postoperative nausea and vomiting)   . PVC (premature ventricular contraction)    HISTORY OF  . Thyroid disease   . V tach ([place])     Family History:  Family History  Problem Relation Age of Onset  . Coronary artery disease Mother   . Thyroid disease Mother   . Multiple myeloma Father   . Heart attack Maternal Grandfather   . Heart attack Paternal Grandfather    Social History: Denies smoking, EtOH, or drug use. Lives by herself in [place]. 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   Review of Systems: A complete ROS was negative except as per HPI.  Physical  Exam: Blood pressure (!) 134/56, pulse 70, temperature 98.2 F (36.8 C), temperature source Oral, resp. rate 15, height 5' 1.5" (1.562 m), weight 116.1 kg, last menstrual period 02/14/1995, SpO2 99 %. Physical Exam Constitutional:      General: She is not in acute distress.    Appearance: She is obese.  HENT:     Head: Normocephalic.     Comments: Laceration on left forehead, no active bleeding noted    Mouth/Throat:     Mouth: Mucous membranes are moist.     Pharynx: Oropharynx is clear.     Comments: Left side of tongue has purple echymosis Eyes:  Extraocular Movements: Extraocular movements intact.     Conjunctiva/sclera: Conjunctivae normal.     Pupils: Pupils are equal, round, and reactive to light.  Cardiovascular:     Rate and Rhythm: Normal rate and regular rhythm.     Pulses: Normal pulses.     Heart sounds: Normal heart sounds.  Pulmonary:     Effort: Pulmonary effort is normal. No respiratory distress.     Breath sounds: Rales (Trace bibasilar rales) present.  Abdominal:     General: Abdomen is flat. Bowel sounds are normal.     Palpations: Abdomen is soft.     Tenderness: There is no abdominal tenderness.  Musculoskeletal:        General: Normal range of motion.     Cervical back: Normal range of motion and neck supple. Tenderness (TTP over left posterior neck) present. No rigidity.     Right lower leg: Edema (Lymphadema of right LE, darkened and thickened skin and deep plaques) present.     Left lower leg: Edema (Lymphadema with thickened skin changes and plaques, no open wounds) present.  Skin:    General: Skin is warm and dry.     Capillary Refill: Capillary refill takes less than 2 seconds.  Neurological:     General: No focal deficit present.     Mental Status: She is alert and oriented to person, place, and time.  Psychiatric:        Mood and Affect: Mood normal.        Behavior: Behavior normal.     EKG: personally reviewed my interpretation is  ventricular rate 72, atrial sensed and ventricular paced, no acute ST changes, prolonged QT, similar to prior  CT head and Cervical spine without contrast: IMPRESSION: Scalp hematoma without calvarial fracture. No evidence of intracranial or cervical spine injury.  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   Syncope: Patient presenting with a syncopal episode that occurred when she was sitting down, preceded by lightheadedness and dizziness, down for an unknown about of time, upon awakening she developed nausea, emesis, and continued to have dizziness. She reports feeling similar to when her ICD fired before. She denied any infectious symptoms. Denied any history of seizures, does not know if she had urinary incontinence or tongue biting. She does have some echymosis on the left side of her tongue. Labs significant for elevated troponin at 51 > 190, hypokalemia and no leukocytosis. CT negative for acute intracranial abnormality, showed scalp hematoma. Seems to be 2/2 cardiac arrhythmia, patient reports her ICD did not fire. Does not appear to be vasovagal since this occurred while she was sitting down. No infectious ysmptoms noted, no headache at this time. This was unwitnessed so seizure is possible however given her extensive cardiac history and placement of ICD a cardiac arrhythmia seems more likely, will hold off on EEG. Cardiology has been consulted and is currently on board.  -Cardiology interrogating ICD -Orthostatic vitals -Cardiac tele -Trend CBC and BMP -Tylenol for pain control  NSTEMI: CAD: Troponins elevated at 51 and 190.  EKG showed no acute ST changes or new T wave inversions, showed ventricular paced rhythm.   Currently denies any chest pain, shortness of breath, or palpitations.  Had a cardiac cath  in 2/21 that showed no obstructive disease.  Had been placed on a heparin drip in the ER. Unclear if further ischemic work up will be done.   -Cardiology is following, appreciate recommendations -Stop heparin drip -Resume home ASA -Monitor for chest pain  CKD stage 3b: Creatinine on admission was 1.32, Cr on 8/27 was 1.35. Appears to be close to baseline. Denies any difficulty with urination.   -Monitor BMP  History of V. tach/V. fib arrest: [employer] CRT-D: Transient atrial fibrillation. Has ICD in place, is on amiodarone daily.  Followed by cardiology.  Having device interrogated in setting of syncopal episode.  Patient was noted to have a transient atrial fibrillation episode previously, this appeared to be brief so patient was not started on anticoagulation.  -Resume home amiodarone 200 mg daily  Hypokalemia: Potassium down to 3.3 on admission, unclear etiology.  Repleted.  Checking mag.  -Daily BMP -Mag  Combined systolic and diastolic heart failure: EF in 2017 was down to 25-30%, ICD was subsequently placed with improvement.  Cardiac cath and 2/21 showed improvement in EF to 50-55%.  She was seen by cardiology on 8/19, noted to have a 20 pound weight gain, and her Lasix was switched to torsemide due to worsening kidney function.  Follow-up with her PCP on 8/27 noted a weight loss of approximately 5 pounds, weight at that time was 116 kilograms.  She reports that her dyspnea on exertion is stable, no chest pain or shortness of breath at rest.  Has trace bibasilar crackles, no obvious JVD, and chronic lymphedema on exam.  Does not appear to be overtly fluid overloaded.  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  Follows with oncology, last seen on 4/21 and has follow-up on 11/21.  Labs appear to be stable  and no infectious symptoms noted.  -Resume home Gleevec  Anemia: Likely 2/2 anemia of chronic disease, prior iron studies and B12 levels were normal.  No active bleeding noted on exam.  Does have a scalp hematoma noted on CT head.  We will continue to monitor hemoglobin level.  -Daily CBC  Hypothyroidism: Patient on thyroid Armour 50 mg daily.  Will resume this.  Dispo: Admit patient to Inpatient with expected length of stay greater than 2 midnights.  Signed: Asencion Noble, MD 05/01/2020, 12:01 PM  Pager: [phone] After 5pm on weekdays and 1pm on weekends: On Call pager: [phone]

## 2020-05-01 NOTE — ED Notes (Signed)
Patient transported to CT 

## 2020-05-01 NOTE — Consult Note (Addendum)
ELECTROPHYSIOLOGY CONSULT NOTE    Patient ID: Lisa [name] MRN: [id number], DOB/AGE: 1940/08/11 79 y.o.  Admit date: 05/01/2020 Date of Consult: 05/01/2020  Primary Physician: [name], MD Primary Cardiologist: [name], MD  Electrophysiologist: Dr. Lovena Le  Referring Provider: Dr. Percival Spanish  Patient Profile: Lisa [name] is a 78 y.o. 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 Percival Spanish.  HPI:  Lisa [name] is a 79 y.o. female with medical history above.   On 09/14/19 she came into the ER with dizziness and no shock with VF and was appropriately paced out. She had subsequent ICD shock and went to the office . While waiting she had a second ICD shock and EMS was called and she was taken to the ER. EP saw and she was started on amiodarone. She reported chest pain so she underwent cardiac cath that showed nonobstructive disease with normal systolic function. Upon review of the device she was noted to have a brief episode of afib. CHADSVASC = 6 however planned to discuss this further as outpatient.   She was seen in the office by EP on 10/06/19 where Dr. Lovena Le deemed afib a brief episode and patient was not started on anticoagulation.   She was seen in June were creatinine had increased to 2.2 and ACE was stopped. Lasix was also held.  The patient was last seen 03/28/20 and had previously reported a 20lbs weight gain. He was started on torsemide 102m every other day.   The patient presented to the this am for syncope. She was standing next to her bed preparing to take her evening medications when she had sudden dizziness with nausea and diaphoresis. She then had syncope and hit her head. She is not sure what she struck her head on her how  long she was out. She had + N/V when she roused. She got dressed and called EMS. Reported these symptoms were similar to prior ICD shocks.   Pertinent labs on admission include Labs showed potassium 3.3, creatinine 1.32, glucose 145, Hgb 9.2. HS troponin 51>197. EKG showed a-sensed V-paced rhythm with no significant changes since last tracing. COVID negative. CT head showed scalp hematoma without calvarial fracture.   Patient was admitted to medicine for further work-up. Cardiology consulted with mild elevated troponin. ICD interrogation revealed appropriate ICD shocks as below.   She has been feeling her USOH lately. She takes 100 mg of amiodarone daily, usually right before bedtime. She states she missed her carvedilol yesterday am, and then this event happened last night.  She forgets her morning medication at times, and thinks she missed coreg about 4 times this week. She had AKI in July and lisinopril and lasix were stopped, she then started having more edema and started on torsemide. She does not take any regular potassium. She usually takes K as needed for leg cramps.  She has chronic DOE. She usually does OK around the house, but may have to stop from time to time walking around. She feels her breathing is "a little worse" over the past month.    Past Medical History:  Diagnosis Date  . CAD in native artery 2017   stent to LAD  . Cardiac arrest [place] 09[place]2015  . CHF ([place]estive heart failure) [place]   .  CML (chronic myelocytic leukemia) ([place])   . Complication of anesthesia   . Hx: UTI (urinary tract infection)   . Hypertension   . Melanoma ([place])   . PONV (postoperative nausea and vomiting)   . PVC (premature ventricular contraction)    HISTORY OF  . Thyroid disease   . V tach [facility])      Surgical History:  Past Surgical History:  Procedure Laterality Date  . ABDOMINAL HYSTERECTOMY    . APPENDECTOMY    . CARDIAC CATHETERIZATION N/A 10/03/2015   Procedure: Left Heart Cath and  Coronary Angiography;  Surgeon: [name], MD;  Location: [facility];  Service: Cardiovascular;  Laterality: N/A;  . CARDIAC CATHETERIZATION N/A 10/07/2015   Procedure: Coronary Stent Intervention;  Surgeon: [name], MD;  Location: [place] [facility];  Service: Cardiovascular;  Laterality: N/A;  . CARDIAC CATHETERIZATION  09/22/2019  . EP IMPLANTABLE DEVICE N/A 01/20/2016   Procedure: BiV ICD Insertion CRT-D;  Surgeon: [name], MD;  Location: [facility];  Service: Cardiovascular;  Laterality: N/A;  . LEFT HEART CATH AND CORONARY ANGIOGRAPHY N/A 09/22/2019   Procedure: LEFT HEART CATH AND CORONARY ANGIOGRAPHY;  Surgeon: Martinique, Peter M, MD;  Location: [facility];  Service: Cardiovascular;  Laterality: N/A;  . TONSILLECTOMY    . TONSILLECTOMY    . TUBAL LIGATION       (Not in a hospital admission)   Inpatient Medications:  . allopurinol  100 mg Oral Daily  . amiodarone  200 mg Oral QHS  . atorvastatin  20 mg Oral Daily  . carvedilol  25 mg Oral BID  . imatinib  400 mg Oral Q supper  . potassium chloride  40 mEq Oral Daily  . [START ON 05/02/2020] thyroid  15 mg Oral QAC breakfast  . torsemide  20 mg Oral QODAY    Allergies:  Allergies  Allergen Reactions  . Flagyl [Metronidazole] Itching, Rash and Other (See Comments)    Welts, also  . Penicillins Hives, Itching and Rash    Has patient had a PCN reaction causing immediate rash, facial/tongue/throat swelling, SOB or lightheadedness with hypotension: Yes  Has patient had a PCN reaction causing severe rash involving mucus membranes or skin necrosis: Yes Has patient had a PCN reaction that required hospitalization No Has patient had a PCN reaction occurring within the last 10 years: NO If all of the above answers are "NO", then may proceed with Cephalosporin use.   . Sulfamethoxazole Rash  . Meperidine Nausea And Vomiting  . Morphine And Related Nausea And Vomiting  . Tape Other (See  Comments)    "THE PLASTIC, CLEAR TAPE CAN/DOES PULL OFF MY SKIN."  . Doxycycline Nausea Only, Rash and Other (See Comments)    Made her "feel terrible"  . Lanolin Itching and Rash  . Sulfa Antibiotics Rash    Social History   Socioeconomic History  . Marital status: Divorced    Spouse name: Not on file  . Number of children: Not on file  . Years of education: Not on file  . Highest education level: Not on file  Occupational History  . Not on file  Tobacco Use  . Smoking status: Never Smoker  . Smokeless tobacco: Never Used  Vaping Use  . Vaping Use: Never used  Substance and Sexual Activity  . Alcohol use: No    Alcohol/week: 0.0 standard drinks  . Drug use: No  . Sexual activity: Not on file  Other Topics Concern  . Not on file  Social History Narrative  . Not on file   Social Determinants of Health   Financial Resource Strain:   . Difficulty of Paying Living Expenses: Not on file  Food Insecurity:   . Worried About Charity fundraiser in the Last Year: Not on file  . Ran Out of Food in the Last Year: Not on file  Transportation Needs:   . Lack of Transportation (Medical): Not on file  . Lack of Transportation (Non-Medical): Not on file  Physical Activity:   . Days of Exercise per Week: Not on file  . Minutes of Exercise per Session: Not on file  Stress:   . Feeling of Stress : Not on file  Social Connections:   . Frequency of Communication with Friends and Family: Not on file  . Frequency of Social Gatherings with Friends and Family: Not on file  . Attends Religious Services: Not on file  . Active Member of Clubs or Organizations: Not on file  . Attends Archivist Meetings: Not on file  . Marital Status: Not on file  Intimate Partner Violence:   . Fear of Current or Ex-Partner: Not on file  . Emotionally Abused: Not on file  . Physically Abused: Not on file  . Sexually Abused: Not on file     Family History  Problem Relation Age of Onset  .  Coronary artery disease Mother   . Thyroid disease Mother   . Multiple myeloma Father   . Heart attack Maternal Grandfather   . Heart attack Paternal Grandfather      Review of Systems: All other systems reviewed and are otherwise negative except as noted above.  Physical Exam: Vitals:   05/01/20 1000 05/01/20 1030 05/01/20 1100 05/01/20 1130  BP: (!) 124/54 (!) 135/56 (!) 133/57 (!) 134/56  Pulse: 70 68 65 70  Resp: 18 15 (!) 24 15  Temp:      TempSrc:      SpO2: 97% 93% 95% 99%  Weight:      Height:        GEN- The patient is well appearing, alert and oriented x 3 today.   HEENT: normocephalic, atraumatic; sclera clear, conjunctiva pink; hearing intact; oropharynx clear; neck supple Lungs- Clear to ausculation bilaterally, normal work of breathing.  No wheezes, rales, rhonchi Heart- Regular rate and rhythm, no murmurs, rubs or gallops GI- soft, non-tender, non-distended, bowel sounds present Extremities- no clubbing, cyanosis, or edema; DP/PT/radial pulses 2+ bilaterally MS- no significant deformity or atrophy Skin- warm and dry, no rash or lesion Psych- euthymic mood, full affect Neuro- strength and sensation are intact  Labs:   Lab Results  Component Value Date   WBC 7.3 05/01/2020   HGB 9.2 (L) 05/01/2020   HCT 30.3 (L) 05/01/2020   MCV 102.7 (H) 05/01/2020   PLT 174 05/01/2020    Recent Labs  Lab 05/01/20 0257  NA 141  K 3.3*  CL 105  CO2 24  BUN 18  CREATININE 1.32*  CALCIUM 8.9  GLUCOSE 145*      Radiology/Studies: CT Head Wo Contrast  Result Date: 05/01/2020 CLINICAL DATA:  Syncopal episode with forehead hematoma EXAM: CT HEAD WITHOUT CONTRAST CT CERVICAL SPINE WITHOUT CONTRAST TECHNIQUE: Multidetector CT imaging of the head and cervical spine was performed following the standard protocol without intravenous contrast. Multiplanar CT image reconstructions of the cervical spine were also generated. COMPARISON:  None. FINDINGS: CT HEAD FINDINGS Brain:  No evidence of acute infarction, hemorrhage, hydrocephalus, extra-axial collection or mass lesion/mass effect. Mild white matter low-density for age. Normal brain volume Vascular: No hyperdense vessel or unexpected calcification. Skull: Left lateral scalp injury with contusion and signs of laceration. No acute fracture Sinuses/Orbits: No evidence of injury CT CERVICAL SPINE FINDINGS Alignment: Normal. Skull base and vertebrae: No acute fracture. No primary bone lesion or focal pathologic process. Soft tissues and spinal canal: No prevertebral fluid or swelling. No visible canal hematoma. Disc levels:  Ordinary degenerative changes for age Upper chest: No evidence of injury IMPRESSION: Scalp hematoma without calvarial fracture. No evidence of intracranial or cervical spine injury. Electronically Signed   By: Monte Fantasia M.D.   On: 05/01/2020 04:24   CT Cervical Spine Wo Contrast  Result Date: 05/01/2020 CLINICAL DATA:  Syncopal episode with forehead hematoma EXAM: CT HEAD WITHOUT CONTRAST CT CERVICAL SPINE WITHOUT CONTRAST TECHNIQUE: Multidetector CT imaging of the head and cervical spine was performed following the standard protocol without intravenous contrast. Multiplanar CT image reconstructions of the cervical spine were also generated. COMPARISON:  None. FINDINGS: CT HEAD FINDINGS Brain: No evidence of acute infarction, hemorrhage, hydrocephalus, extra-axial collection or mass lesion/mass effect. Mild white matter low-density for age. Normal brain volume Vascular: No hyperdense vessel or unexpected calcification. Skull: Left lateral scalp injury with contusion and signs of laceration. No acute fracture Sinuses/Orbits: No evidence of injury CT CERVICAL SPINE FINDINGS Alignment: Normal. Skull base and vertebrae: No acute fracture. No primary bone lesion or focal pathologic process. Soft tissues and spinal canal: No prevertebral fluid or swelling. No visible canal hematoma. Disc levels:  Ordinary  degenerative changes for age Upper chest: No evidence of injury IMPRESSION: Scalp hematoma without calvarial fracture. No evidence of intracranial or cervical spine injury. Electronically Signed   By: Monte Fantasia M.D.   On: 05/01/2020 04:24   CUP PACEART REMOTE DEVICE CHECK  Result Date: 04/30/2020 Scheduled remote reviewed. Normal device function. 3 NSVT, longest 6 sec with rates >200bpm falling into VF zone (all in September). On Amiodarone Next remote 91 days- [employer], RN/CVRS  CARDIAC CATH: 09/22/2019  Mid LM to Dist LM lesion is 20% stenosed.  Previously placed Mid LAD stent (unknown type) is widely patent.  [place] lesion is 65% stenosed.  The left ventricular systolic function is normal.  LV end diastolic pressure is moderately elevated.  The left ventricular ejection fraction is 50-55% by visual estimate.  1. Nonobstructive CAD. The stent in the mid LAD is widely patent. There is modest ostial disease in a small ramus branch that is unchanged from 2017. 2. Mild LV dysfunction. EF 50%. 3. Elevated LVEDP 23 mm Hg  VF episode 05/01/2020      EKG: on arrival showed NSR at 72 bpm, QRS 114 ms (personally reviewed)  TELEMETRY:  NSR 70-80s, + PVCs, + NSVT of 8-10 beats (personally reviewed)  DEVICE HISTORY: MDT CRT-D implanted 01/20/2016  Assessment/Plan: 1.  PMVT with appropriate therapy Will load amiodarone via IV, then increase amiodarone to 400 mg BID and taper as outpatient.  Continue coreg 25 mg BID K 3.3. Supp ordered( 40 meq) Mg 1.8. Supp. Update Echo. No driving x 6 months per NCDMV Guidelines Suspect mild HS troponin elevation in setting of ICD shock. She has some long shorting associated with her VT both this time and in February. She also had optivol elevation prior to both VT events. Her optivol shows improvement since going back on torsemide.  2. CAD PCI of LAD in 09/2015  Myoview 09/05/2019 LVEF 47%, no ischemia or prior infarct.  LHC 09/2019 with  non-obstructive CAD  3. Chronic diastolic CHF with h/o systolic CHF  With normalization of EF s/p CRT in 2017 Most recent Echo 06/2017 showed LVEF 55-60%. Will update. Volume status difficult. Her lower legs are quite large with chronic venous stasis changes and chronic lymphedema Supp K.  4. Subclinical AF Short duration of AF noted in the past.  CHA2DS2VASC of at least 6. With no stroke or h/o TE, [place] has been deferred due to overall very low burden.     5. HTN  120-130s today. Follow.   6. CML She is on Imatinib (GLEEVEC) currently and reports that she is "responding"  7. Chronic lymphedema Slightly worse than baseline per pt. Imatinib can rarely be associated with systolic dysfunction. She has been on this since at least 2018.  8. Hypokalemia K 3.3. In February it was 3.5, so not sure this is the primary driver of her VT.   9. OSA She has been diagnosed but has not followed up for CPAP titration. Encouraged to follow up.   For questions or updates, please contact [place] Please consult [email or web address] for contact info under Cardiology/STEMI.  Signed, Shirley Friar, PA-C  05/01/2020 2:00 PM   VT polymorphic some long short coupling  CHF acute/chronic  Cardiomyopathy NICM with   CAD  CML  Lymphedema  ICD Medtronic CRT  LBBB  LV function-recovered  Afib paroxysmal     The pt has recurrent VT-Polymorphic occurring in the setting of elevated optivol, borderline low K and long short coupling (though delta cycle length about 250 msec) are not so striking to make me think that this is torsades de Pointes as a mechanism although possible.  I am more concerned that it is related to electrico mechanical interactions and 2/2 CHF.  There is an large amount of ambient ectopy that was temporally assoc with this episode and the one last winter  QT interval not as long as read by computer  Mg borderlline  Will supplement   Will treat aggressively with  amiodarone, and will likely need higher maintence dosing   Would check echo  No driving x 6 months  Device interrogated but not reprogrammed

## 2020-05-01 NOTE — ED Triage Notes (Signed)
Patient is from home, she got up from chair to go to bed, she had a syncopal episode in the bedroom and hit something on the way down, hematoma on the left forehead, neck pain.  Patient is CAOx4 at this time.

## 2020-05-01 NOTE — Consult Note (Addendum)
Cardiology Admission History and Physical:   Patient ID: Lisa [name] MRN: [id number]; DOB: [birth year]   Admission date: 05/01/2020  Primary Care Provider: [name], MD [facility] Cardiologist: [name], MD  [facility] Electrophysiologist:  Cristopher [name], MD   Chief Complaint:  Syncope  Patient Profile:   Lisa [name] is a 79 y.o. 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   History of Present Illness:   Lisa [name] is followed by Dr. Harrington Challenger for the above cardiac issues. Lisa [name] had a cath in 2017 with DES to mLAD and otherwise clear coronaries, EF at that time was 25-30%, Lisa [name] did not improve with medical management. Lisa [name] underwent implantation of MDT BiV ICD for LBBB and EF of 25%. EF improved to 55-60% by 04/2016. Lisinopril and spironolactone stopped for hyperkalemia. Lisinopril was added back during an admission for cellulitis at [facility].   On 09/14/19 Lisa [name] came into the ER with dizziness and no shock with VF and was appropriately paced out. Lisa [name] had subsequent ICD shock and went to the office . While waiting Lisa [name] had a second ICD shock and EMS was called and Lisa [name] was taken to the ER. EP saw and Lisa [name] was started on amiodarone. Lisa [name] reported chest pain so Lisa [name] underwent cardiac cath that showed nonobstructive disease with normal systolic function. Upon review of the device Lisa [name] was noted to have a brief episode of afib. CHADSVASC = 6 however planned to discuss this further as outpatient.   Lisa [name] was seen in the office by EP on 10/06/19 where Dr. Lovena Le deemed afib a brief episode and patient was not started on anticoagulation.   Lisa [name] was seen in June were creatinine had increased to 2.2 and ACE was stopped. Lasix was also held.  The  patient was last seen 03/28/20 and had previously reported a 20lbs weight gain. He was started on torsemide 84m every other day.   The patient presented to the ED 05/01/20 for syncope. Last night the patient was standing next to her ned about to take her spiro and coreg when Lisa [name] felt suddenly severe dizziness. Lisa [name] felt nauseous and sweaty and fainted. Lisa [name] apparently hit her head on something, Lisa [name] is not sure what. Lisa [name] is not sure how long Lisa [name] was down for. Lisa [name] woke up and felt really sweaty and weak. Lisa [name] finally pulled herself up to the chair where Lisa [name] felt nauseous and vomited multiple times. Lisa [name] is unsure if device went off. Lisa [name] eventually got dressed and called EMS. Denies recent weight gain, orthopnea, worsening LLE, fever, chills, chest pain, palpitations. Apparently this dizzy episode was similar to prior ICD shocks for arrhythmias.   In the ED BP 154/62, pulse 77, afebrile, RR 12, 97% O2. Labs showed potassium 3.3, creatinine 1.32, glucose 145, Hgb 9.2. HS troponin 51>197. EKG showed a-sensed V-paced rhythm with no significant changes since last tracing. COVID negative. CT head showed scalp hematoma without clavicular fracture. Patient was admitted for further work-up.   Past Medical History:  Diagnosis Date  . CAD in native artery 2017   stent to LAD  . [place]  . CH[place] ([place]estive heart failure) ([place]   . [place]   . Complication of anesthesia   . Hx: UTI (urinary  tract infection)   . Hypertension   . Melanoma ([place])   . PONV (postoperative nausea and vomiting)   . PVC (premature ventricular contraction)    HISTORY OF  . Thyroid disease   . V tach [facility])     Past Surgical History:  Procedure Laterality Date  . ABDOMINAL HYSTERECTOMY    . APPENDECTOMY    . CARDIAC CATHETERIZATION N/A 10/03/2015   Procedure: Left Heart Cath and Coronary Angiography;  Surgeon: [name], MD;  Location: [place] CV LAB;  Service: Cardiovascular;   Laterality: N/A;  . CARDIAC CATHETERIZATION N/A 10/07/2015   Procedure: Coronary Stent Intervention;  Surgeon: [name], MD;  Location: [place] CV [facility];  Service: Cardiovascular;  Laterality: N/A;  . CARDIAC CATHETERIZATION  09/22/2019  . EP IMPLANTABLE DEVICE N/A 01/20/2016   Procedure: BiV ICD Insertion CRT-D;  Surgeon: [name], MD;  Location: [place] CV LAB;  Service: Cardiovascular;  Laterality: N/A;  . LEFT HEART CATH AND CORONARY ANGIOGRAPHY N/A 09/22/2019   Procedure: LEFT HEART CATH AND CORONARY ANGIOGRAPHY;  Surgeon: Martinique, Peter M, MD;  Location: [place] CV LAB;  Service: Cardiovascular;  Laterality: N/A;  . TONSILLECTOMY    . TONSILLECTOMY    . TUBAL LIGATION       Medications Prior to Admission: Prior to Admission medications   Medication Sig Start Date End Date Taking? Authorizing Provider  acetaminophen (TYLENOL) 500 MG tablet Take 500-1,000 mg by mouth every 6 (six) hours as needed for moderate pain.   Yes [provider]  allopurinol (ZYLOPRIM) 100 MG tablet Take 100 mg by mouth daily.  06/15/17  Yes [provider]  amiodarone (PACERONE) 200 MG tablet Take 1 tablet (200 mg total) by mouth daily. Patient taking differently: Take 200 mg by mouth at bedtime.  12/01/19  Yes [name], MD  aspirin EC 81 MG tablet Take 81 mg by mouth at bedtime.    Yes [provider]  atorvastatin (LIPITOR) 20 MG tablet Take 1 tablet (20 mg total) by mouth daily. 05/07/16  Yes Bensimhon, Shaune Pascal, MD  carvedilol (COREG) 25 MG tablet Take 1 tablet (25 mg total) by mouth 2 (two) times daily. 01/24/20  Yes Fay Records, MD  Cholecalciferol (VITAMIN D-3 PO) Take 5,000 Units by mouth daily.   Yes [provider]  imatinib (GLEEVEC) 400 MG tablet Take 400 mg by mouth daily with supper.  02/16/17  Yes [provider]  Inulin (METAMUCIL CLEAR & NATURAL PO) Take by mouth See admin instructions. Mix 1 teaspoonful into 8 ounces of  water and drink once a day   Yes [provider]  Magnesium Oxide 400 MG CAPS Take 1 capsule (400 mg total) by mouth 2 (two) times daily. 10/18/19  Yes Baldwin Jamaica, PA-C  Multiple Vitamin (MULTIVITAMIN WITH MINERALS) TABS tablet Take 1 tablet by mouth daily.   Yes [provider]  nitroGLYCERIN (NITROSTAT) 0.4 MG SL tablet Place 1 tablet (0.4 mg total) under the tongue every 5 (five) minutes x 3 doses as needed for chest pain. 02/28/18  Yes [name], MD  thyroid (ARMOUR) 15 MG tablet Take 15 mg by mouth daily before breakfast.    Yes [provider]  torsemide (DEMADEX) 20 MG tablet Take 1 tablet (20 mg total) by mouth every other day. 03/28/20  Yes Fay Records, MD     Allergies:    Allergies  Allergen Reactions  . Flagyl [Metronidazole] Itching, Rash  and Other (See Comments)    Welts, also  . Penicillins Hives, Itching and Rash    Has patient had a PCN reaction causing immediate rash, facial/tongue/throat swelling, SOB or lightheadedness with hypotension: Yes  Has patient had a PCN reaction causing severe rash involving mucus membranes or skin necrosis: Yes Has patient had a PCN reaction that required hospitalization No Has patient had a PCN reaction occurring within the last 10 years: NO If all of the above answers are "NO", then may proceed with Cephalosporin use.   . Sulfamethoxazole Rash  . Meperidine Nausea And Vomiting  . Morphine And Related Nausea And Vomiting  . Tape Other (See Comments)    "THE PLASTIC, CLEAR TAPE CAN/DOES PULL OFF MY SKIN."  . Doxycycline Nausea Only, Rash and Other (See Comments)    Made her "feel terrible"  . Lanolin Itching and Rash  . Sulfa Antibiotics Rash    Social History:   Social History   Socioeconomic History  . Marital status: Divorced    Spouse name: Not on file  . Number of children: Not on file  . Years of education: Not on file  . Highest education level: Not on file  Occupational History  .  Not on file  Tobacco Use  . Smoking status: Never Smoker  . Smokeless tobacco: Never Used  Vaping Use  . Vaping Use: Never used  Substance and Sexual Activity  . Alcohol use: No    Alcohol/week: 0.0 standard drinks  . Drug use: No  . Sexual activity: Not on file  Other Topics Concern  . Not on file  Social History Narrative  . Not on file   Social Determinants of Health   Financial Resource Strain:   . Difficulty of Paying Living Expenses: Not on file  Food Insecurity:   . Worried About Charity fundraiser in the Last Year: Not on file  . Ran Out of Food in the Last Year: Not on file  Transportation Needs:   . Lack of Transportation (Medical): Not on file  . Lack of Transportation (Non-Medical): Not on file  Physical Activity:   . Days of Exercise per Week: Not on file  . Minutes of Exercise per Session: Not on file  Stress:   . Feeling of Stress : Not on file  Social Connections:   . Frequency of Communication with Friends and Family: Not on file  . Frequency of Social Gatherings with Friends and Family: Not on file  . Attends Religious Services: Not on file  . Active Member of Clubs or Organizations: Not on file  . Attends Archivist Meetings: Not on file  . Marital Status: Not on file  Intimate Partner Violence:   . Fear of Current or Ex-Partner: Not on file  . Emotionally Abused: Not on file  . Physically Abused: Not on file  . Sexually Abused: Not on file    Family History:   The patient's family history includes Coronary artery disease in her mother; Heart attack in her maternal grandfather and paternal grandfather; Multiple myeloma in her father; Thyroid disease in her mother.    ROS:  Please see the history of present illness.  All other ROS reviewed and negative.     Physical Exam/Data:   Vitals:   05/01/20 0530 05/01/20 0600 05/01/20 0700 05/01/20 0803  BP: (!) 140/54 (!) 120/49  (!) 135/48  Pulse: 70 70  72  Resp: _0 Temp:    98.2  F (36.8 C)  TempSrc:    Oral  SpO2: 97% 100%  97%  Weight:   116.1 kg   Height:   5' 1.5" (1.562 m)    No intake or output data in the 24 hours ending 05/01/20 1037 Last 3 Weights 05/01/2020 03/28/2020 01/24/2020  Weight (lbs) 256 lb 261 lb 3.2 oz 247 lb 9.6 oz  Weight (kg) 116.121 kg 118.48 kg 112.311 kg     Body mass index is 47.59 kg/m.  General:  Well nourished, well developed, in no acute distress HEENT: normal Lymph: no adenopathy Neck: no JVD Endocrine:  No thryomegaly Vascular: No carotid bruits; FA pulses 2+ bilaterally without bruits  Cardiac:  normal S1, S2; RRR; no murmur  Lungs:  clear to auscultation bilaterally, no wheezing, rhonchi or rales  Abd: soft, nontender, no hepatomegaly  Ext: no edema, severe lymphedema Musculoskeletal:  No deformities, BUE and BLE strength normal and equal Skin: warm and dry  Neuro:  CNs 2-12 intact, no focal abnormalities noted Psych:  Normal affect    EKG:  The ECG that was done 05/01/20 was personally reviewed and demonstrates A-sensed V-paced rhythym, LAD, 72 bpm, nonspecific TW changes, no significant change since last tracing  Relevant CV Studies:  Cardiac cath 10/01/19   Mid LM to Dist LM lesion is 20% stenosed.  Previously placed Mid LAD stent (unknown type) is widely patent.  Ramus lesion is 65% stenosed.  The left ventricular systolic function is normal.  LV end diastolic pressure is moderately elevated.  The left ventricular ejection fraction is 50-55% by visual estimate.   1. Nonobstructive CAD. The stent in the mid LAD is widely patent. There is modest ostial disease in a small ramus branch that is unchanged from 2017. 2. Mild LV dysfunction. EF 50%. 3. Elevated LVEDP 23 mm Hg.  Plan; medical therapy.   Coronary Diagrams  Diagnostic Dominance: Right   Myoview stress test 09/05/19  Nuclear stress EF: 47%. Visually, the EF appears to be greater than the computer generated EF of 47%.  There was no ST  segment deviation noted during stress.  This is a low risk study.  The study is normal. There is no evidence of ischemia or previous infarction.  Echo 06/2017 Study Conclusions   - Left ventricle: The cavity size was normal. Wall thickness was  normal. Systolic function was normal. The estimated ejection  fraction was in the range of 55% to 60%.  - Left atrium: The atrium was mildly dilated.    Laboratory Data:  High Sensitivity Troponin:   Recent Labs  Lab 05/01/20 0257 05/01/20 0536  TROPONINIHS 51* 190*      Chemistry Recent Labs  Lab 05/01/20 0257  NA 141  K 3.3*  CL 105  CO2 24  GLUCOSE 145*  BUN 18  CREATININE 1.32*  CALCIUM 8.9  GFRNONAA 39*  GFRAA 45*  ANIONGAP 12    No results for input(s): PROT, ALBUMIN, AST, ALT, ALKPHOS, BILITOT in the last 168 hours. Hematology Recent Labs  Lab 05/01/20 0257  WBC 7.3  RBC 2.95*  HGB 9.2*  HCT 30.3*  MCV 102.7*  MCH 31.2  MCHC 30.4  RDW 13.5  PLT 174   BNPNo results for input(s): BNP, PROBNP in the last 168 hours.  DDimer No results for input(s): DDIMER in the last 168 hours.   Radiology/Studies:  CT Head Wo Contrast  Result Date: 05/01/2020 CLINICAL DATA:  Syncopal episode with forehead hematoma EXAM: CT HEAD WITHOUT CONTRAST CT CERVICAL  SPINE WITHOUT CONTRAST TECHNIQUE: Multidetector CT imaging of the head and cervical spine was performed following the standard protocol without intravenous contrast. Multiplanar CT image reconstructions of the cervical spine were also generated. COMPARISON:  None. FINDINGS: CT HEAD FINDINGS Brain: No evidence of acute infarction, hemorrhage, hydrocephalus, extra-axial collection or mass lesion/mass effect. Mild white matter low-density for age. Normal brain volume Vascular: No hyperdense vessel or unexpected calcification. Skull: Left lateral scalp injury with contusion and signs of laceration. No acute fracture Sinuses/Orbits: No evidence of injury CT CERVICAL SPINE  FINDINGS Alignment: Normal. Skull base and vertebrae: No acute fracture. No primary bone lesion or focal pathologic process. Soft tissues and spinal canal: No prevertebral fluid or swelling. No visible canal hematoma. Disc levels:  Ordinary degenerative changes for age Upper chest: No evidence of injury IMPRESSION: Scalp hematoma without calvarial fracture. No evidence of intracranial or cervical spine injury. Electronically Signed   By: Monte Fantasia M.D.   On: 05/01/2020 04:24   CT Cervical Spine Wo Contrast  Result Date: 05/01/2020 CLINICAL DATA:  Syncopal episode with forehead hematoma EXAM: CT HEAD WITHOUT CONTRAST CT CERVICAL SPINE WITHOUT CONTRAST TECHNIQUE: Multidetector CT imaging of the head and cervical spine was performed following the standard protocol without intravenous contrast. Multiplanar CT image reconstructions of the cervical spine were also generated. COMPARISON:  None. FINDINGS: CT HEAD FINDINGS Brain: No evidence of acute infarction, hemorrhage, hydrocephalus, extra-axial collection or mass lesion/mass effect. Mild white matter low-density for age. Normal brain volume Vascular: No hyperdense vessel or unexpected calcification. Skull: Left lateral scalp injury with contusion and signs of laceration. No acute fracture Sinuses/Orbits: No evidence of injury CT CERVICAL SPINE FINDINGS Alignment: Normal. Skull base and vertebrae: No acute fracture. No primary bone lesion or focal pathologic process. Soft tissues and spinal canal: No prevertebral fluid or swelling. No visible canal hematoma. Disc levels:  Ordinary degenerative changes for age Upper chest: No evidence of injury IMPRESSION: Scalp hematoma without calvarial fracture. No evidence of intracranial or cervical spine injury. Electronically Signed   By: Monte Fantasia M.D.   On: 05/01/2020 04:24   {  Assessment and Plan:   Syncope - Presents with severe dizziness, nasuea, and diaphoresis prior to syncopal episode which was  followed by nausea and multiple episodes of vomiting. Also hit her head, unsure on what.  - CT head/neck showed scalp hematoma - Hs trop 51>190. Continue to trend - Labs showed mild hypokalemia and relatively stable kidney function. Will check a Mag - EKG without changes - Interrogate ICD - Check orthostatics  Elevated troponin with known CAD - Hs troponin 51>190 - EKG with no changes - cath in 2/21 showed no obstructive disease.  - Patient denies chest pain - Suspect no further ischemic work-up  Chronic combined CHF s/p ICD/Mixed NICM and ICM - Initial EF in 12/2015 down to 25-30% and did not improve after PCI. ICD was placed and EF subsequently improved. Cath in 09/2019 showed LVEF 50-55% - Was started on torsemide 24m every other day for volume overload - continue coreg - creatinine 1.32, up from 8/26 at 1.21 - Appears euvolemic on exam.   Hx of VT/VF arrest - s/p ICD - amiodarone since 09/2019. Can check amiodarone level - followed by EP - Keep K+>4 and Mag >2 - Patient said dizzy spell was similar to prior episodes of VF/VT s/p ICD shocks  S/p Medtronic CRT-D - last device interrogation 04/30/20 normal device functioning, 3 NSVT, longest 6 seconds with rates >200bpm - Interrogation  today  Transient PAF - this was a brief episode so Lisa [name] was not started on anticoagulation  HLD - atorvastatin  For questions or updates, please contact [place] Please consult [email or web address] for contact info under     Signed, Cadence Ninfa Meeker, PA-C  05/01/2020 10:37 AM   History and all data above reviewed.  Patient examined.  I agree with the findings as above.  The patient is a retired Radiation protection practitioner.  Lisa [name] has a history of VT and ICD discharges as above.  Lisa [name] has been treated with amiodarone.  EF earlier this year was relatively well preserved and no ischemia infarct on a perfusion study earlier this year.  Now presents with recurrent symptoms of dizziness followed by frank syncope.   The exact timing/duration of symptoms was not clear prior to the patient finding herself on the floor with a "bump" on her head.  Lisa [name] did not feel palpitations and did not feel her ICD fire.  Lisa [name] has otherwise been feeling OK and since February has had no presyncope/syncope/palpitations/PND/orthopnea, or chest pain.  Lisa [name] does have chronic DOE.  Lisa [name] has chronic lower extremity lymphedema.  The patient exam reveals COR:RRR  ,  Lungs: Clear  ,  Abd: Morbidly obese, positive bowel sounds, Ext Diffuse lymphedema  .  All available labs, radiology testing, previous records reviewed. Agree with documented assessment and plan.   Syncope:  Likely related to arrhythmia (VT/VFib).  I am waiting for a device interrogation.  In the meantime we will check an amiodarone level.  Lisa [name] likely is under dosed and Lisa [name] will need IV amio.  I will talk with Dr. Lovena Le to see in the AM.  Check orthostatics.  Check magnesium level as Lisa [name] has had hypomag in the past.    Minus Breeding  2:17 PM  05/01/2020

## 2020-05-01 NOTE — Progress Notes (Signed)
Echocardiogram 2D Echocardiogram has been performed.  Oneal Deputy Cuyler Vandyken 05/01/2020, 3:46 PM

## 2020-05-02 DIAGNOSIS — I469 Cardiac arrest, cause unspecified: Secondary | ICD-10-CM

## 2020-05-02 DIAGNOSIS — I5022 Chronic systolic (congestive) heart failure: Secondary | ICD-10-CM

## 2020-05-02 DIAGNOSIS — D631 Anemia in chronic kidney disease: Secondary | ICD-10-CM

## 2020-05-02 DIAGNOSIS — C9210 Chronic myeloid leukemia, BCR/ABL-positive, not having achieved remission: Secondary | ICD-10-CM

## 2020-05-02 DIAGNOSIS — E039 Hypothyroidism, unspecified: Secondary | ICD-10-CM

## 2020-05-02 DIAGNOSIS — I447 Left bundle-branch block, unspecified: Secondary | ICD-10-CM

## 2020-05-02 DIAGNOSIS — N1832 Chronic kidney disease, stage 3b: Secondary | ICD-10-CM

## 2020-05-02 DIAGNOSIS — E669 Obesity, unspecified: Secondary | ICD-10-CM

## 2020-05-02 DIAGNOSIS — I130 Hypertensive heart and chronic kidney disease with heart failure and stage 1 through stage 4 chronic kidney disease, or unspecified chronic kidney disease: Secondary | ICD-10-CM

## 2020-05-02 DIAGNOSIS — R600 Localized edema: Secondary | ICD-10-CM

## 2020-05-02 DIAGNOSIS — E785 Hyperlipidemia, unspecified: Secondary | ICD-10-CM

## 2020-05-02 DIAGNOSIS — I890 Lymphedema, not elsewhere classified: Secondary | ICD-10-CM

## 2020-05-02 LAB — MAGNESIUM: Magnesium: 2 mg/dL (ref 1.7–2.4)

## 2020-05-02 LAB — CBC
HCT: 28.1 % — ABNORMAL LOW (ref 36.0–46.0)
Hemoglobin: 8.4 g/dL — ABNORMAL LOW (ref 12.0–15.0)
MCH: 31.3 pg (ref 26.0–34.0)
MCHC: 29.9 g/dL — ABNORMAL LOW (ref 30.0–36.0)
MCV: 104.9 fL — ABNORMAL HIGH (ref 80.0–100.0)
Platelets: 166 10*3/uL (ref 150–400)
RBC: 2.68 MIL/uL — ABNORMAL LOW (ref 3.87–5.11)
RDW: 13.9 % (ref 11.5–15.5)
WBC: 6.1 10*3/uL (ref 4.0–10.5)
nRBC: 0 % (ref 0.0–0.2)

## 2020-05-02 LAB — COMPREHENSIVE METABOLIC PANEL
ALT: 18 U/L (ref 0–44)
AST: 21 U/L (ref 15–41)
Albumin: 2.9 g/dL — ABNORMAL LOW (ref 3.5–5.0)
Alkaline Phosphatase: 52 U/L (ref 38–126)
Anion gap: 7 (ref 5–15)
BUN: 13 mg/dL (ref 8–23)
CO2: 25 mmol/L (ref 22–32)
Calcium: 8.3 mg/dL — ABNORMAL LOW (ref 8.9–10.3)
Chloride: 109 mmol/L (ref 98–111)
Creatinine, Ser: 1.21 mg/dL — ABNORMAL HIGH (ref 0.44–1.00)
GFR calc Af Amer: 50 mL/min — ABNORMAL LOW (ref >60)
GFR calc non Af Amer: 43 mL/min — ABNORMAL LOW (ref >60)
Glucose, Bld: 136 mg/dL — ABNORMAL HIGH (ref 70–99)
Potassium: 4.5 mmol/L (ref 3.5–5.1)
Sodium: 141 mmol/L (ref 135–145)
Total Bilirubin: 0.4 mg/dL (ref 0.3–1.2)
Total Protein: 5.4 g/dL — ABNORMAL LOW (ref 6.5–8.1)

## 2020-05-02 LAB — HEPARIN LEVEL (UNFRACTIONATED): Heparin Unfractionated: 0.2 IU/mL — ABNORMAL LOW (ref 0.30–0.70)

## 2020-05-02 MED ORDER — DICLOFENAC SODIUM 1 % EX GEL
2.0000 g | Freq: Four times a day (QID) | CUTANEOUS | Status: DC | PRN
  Administered 2020-05-02: 18:00:00 2 g via TOPICAL
  Filled 2020-05-02: qty 100

## 2020-05-02 MED ORDER — POTASSIUM CHLORIDE 20 MEQ PO PACK
40.0000 meq | PACK | Freq: Every day | ORAL | Status: DC
  Administered 2020-05-02: 40 meq via ORAL
  Filled 2020-05-02: qty 2

## 2020-05-02 MED ORDER — IMATINIB MESYLATE 400 MG PO TABS
400.0000 mg | ORAL_TABLET | Freq: Every day | ORAL | Status: DC
  Administered 2020-05-02 – 2020-05-03 (×2): 400 mg via ORAL
  Filled 2020-05-02 (×3): qty 1

## 2020-05-02 MED ORDER — TORSEMIDE 20 MG PO TABS
20.0000 mg | ORAL_TABLET | ORAL | Status: DC
  Administered 2020-05-02: 20 mg via ORAL

## 2020-05-02 MED ORDER — ENOXAPARIN SODIUM 60 MG/0.6ML ~~LOC~~ SOLN
60.0000 mg | SUBCUTANEOUS | Status: DC
  Administered 2020-05-02 – 2020-05-03 (×2): 60 mg via SUBCUTANEOUS
  Filled 2020-05-02 (×3): qty 0.6

## 2020-05-02 MED ORDER — ASPIRIN EC 81 MG PO TBEC
81.0000 mg | DELAYED_RELEASE_TABLET | Freq: Every day | ORAL | Status: DC
  Administered 2020-05-02 – 2020-05-03 (×2): 81 mg via ORAL
  Filled 2020-05-02 (×2): qty 1

## 2020-05-02 MED ORDER — FUROSEMIDE 10 MG/ML IJ SOLN
40.0000 mg | Freq: Once | INTRAMUSCULAR | Status: AC
  Administered 2020-05-02: 40 mg via INTRAVENOUS
  Filled 2020-05-02: qty 4

## 2020-05-02 NOTE — Progress Notes (Addendum)
Electrophysiology Rounding Note  Patient Name: Lisa [name] Date of Encounter: 05/02/2020  Primary Cardiologist: [name], MD Electrophysiologist: Cristopher [name], MD   Subjective   The patient is doing well today.  At this time, the patient denies chest pain, shortness of breath, or any new concerns. No further shocks or VT  Inpatient Medications    Scheduled Meds:  allopurinol  100 mg Oral Daily   aspirin EC  81 mg Oral Daily   atorvastatin  20 mg Oral Daily   carvedilol  25 mg Oral BID   enoxaparin (LOVENOX) injection  60 mg Subcutaneous Q24H   imatinib  400 mg Oral Q supper   potassium chloride  40 mEq Oral Daily   thyroid  15 mg Oral QAC breakfast   torsemide  20 mg Oral QODAY   Continuous Infusions:  amiodarone 30 mg/hr (05/01/20 2251)   PRN Meds: acetaminophen **OR** acetaminophen, diclofenac Sodium, ondansetron **OR** ondansetron (ZOFRAN) IV   Vital Signs    Vitals:   05/01/20 2315 05/01/20 2333 05/02/20 0006 05/02/20 0512  BP:  (!) 115/36  (!) 101/39  Pulse: 72 71  74  Resp: 20 18  16   Temp:  99.4 F (37.4 C)  98.9 F (37.2 C)  TempSrc:  Oral  Oral  SpO2: 90% 99%  96%  Weight:   116.7 kg   Height:   5\' 2"  (1.575 m)     Intake/Output Summary (Last 24 hours) at 05/02/2020 0807 Last data filed at 05/02/2020 0000 Gross per 24 hour  Intake 598.99 ml  Output --  Net 598.99 ml   Filed Weights   05/01/20 0700 05/02/20 0006  Weight: 116.1 kg 116.7 kg    Physical Exam    GEN- The patient is well appearing, alert and oriented x 3 today.   Head- normocephalic, atraumatic Eyes-  Sclera clear, conjunctiva pink Ears- hearing intact Oropharynx- clear Neck- supple Lungs- Clear to ausculation bilaterally, normal work of breathing Heart- Regular rate and rhythm, no murmurs, rubs or gallops GI- soft, NT, ND, + BS Extremities- no clubbing or cyanosis. No edema Skin- no rash or lesion Psych- euthymic mood, full affect Neuro- strength and  sensation are intact  Labs    CBC Recent Labs    05/01/20 0257  WBC 7.3  NEUTROABS 4.9  HGB 9.2*  HCT 30.3*  MCV 102.7*  PLT 469   Basic Metabolic Panel Recent Labs    05/01/20 0257 05/01/20 1400  NA 141  --   K 3.3*  --   CL 105  --   CO2 24  --   GLUCOSE 145*  --   BUN 18  --   CREATININE 1.32*  --   CALCIUM 8.9  --   MG  --  1.7   Liver Function Tests No results for input(s): AST, ALT, ALKPHOS, BILITOT, PROT, ALBUMIN in the last 72 hours. No results for input(s): LIPASE, AMYLASE in the last 72 hours. Cardiac Enzymes No results for input(s): CKTOTAL, CKMB, CKMBINDEX, TROPONINI in the last 72 hours.   Telemetry    NSR (AS VP) in 70s. No further VT (personally reviewed)  Radiology    CT Head Wo Contrast  Result Date: 05/01/2020 CLINICAL DATA:  Syncopal episode with forehead hematoma EXAM: CT HEAD WITHOUT CONTRAST CT CERVICAL SPINE WITHOUT CONTRAST TECHNIQUE: Multidetector CT imaging of the head and cervical spine was performed following the standard protocol without intravenous contrast. Multiplanar CT image reconstructions of the cervical spine were also generated.  COMPARISON:  None. FINDINGS: CT HEAD FINDINGS Brain: No evidence of acute infarction, hemorrhage, hydrocephalus, extra-axial collection or mass lesion/mass effect. Mild white matter low-density for age. Normal brain volume Vascular: No hyperdense vessel or unexpected calcification. Skull: Left lateral scalp injury with contusion and signs of laceration. No acute fracture Sinuses/Orbits: No evidence of injury CT CERVICAL SPINE FINDINGS Alignment: Normal. Skull base and vertebrae: No acute fracture. No primary bone lesion or focal pathologic process. Soft tissues and spinal canal: No prevertebral fluid or swelling. No visible canal hematoma. Disc levels:  Ordinary degenerative changes for age Upper chest: No evidence of injury IMPRESSION: Scalp hematoma without calvarial fracture. No evidence of intracranial or  cervical spine injury. Electronically Signed   By: Monte Fantasia M.D.   On: 05/01/2020 04:24   CT Cervical Spine Wo Contrast  Result Date: 05/01/2020 CLINICAL DATA:  Syncopal episode with forehead hematoma EXAM: CT HEAD WITHOUT CONTRAST CT CERVICAL SPINE WITHOUT CONTRAST TECHNIQUE: Multidetector CT imaging of the head and cervical spine was performed following the standard protocol without intravenous contrast. Multiplanar CT image reconstructions of the cervical spine were also generated. COMPARISON:  None. FINDINGS: CT HEAD FINDINGS Brain: No evidence of acute infarction, hemorrhage, hydrocephalus, extra-axial collection or mass lesion/mass effect. Mild white matter low-density for age. Normal brain volume Vascular: No hyperdense vessel or unexpected calcification. Skull: Left lateral scalp injury with contusion and signs of laceration. No acute fracture Sinuses/Orbits: No evidence of injury CT CERVICAL SPINE FINDINGS Alignment: Normal. Skull base and vertebrae: No acute fracture. No primary bone lesion or focal pathologic process. Soft tissues and spinal canal: No prevertebral fluid or swelling. No visible canal hematoma. Disc levels:  Ordinary degenerative changes for age Upper chest: No evidence of injury IMPRESSION: Scalp hematoma without calvarial fracture. No evidence of intracranial or cervical spine injury. Electronically Signed   By: Monte Fantasia M.D.   On: 05/01/2020 04:24   ECHOCARDIOGRAM COMPLETE  Result Date: 05/01/2020    ECHOCARDIOGRAM REPORT   Patient Name:   Lisa [name] Date of Exam: 05/01/2020 Medical Rec #:  [id number]          Height:       61.5 in Accession #:    [id number]         Weight:       256.0 lb Date of Birth:  25-Feb-1941           BSA:          2.110 m Patient Age:    79 years           BP:           134/56 mmHg Patient Gender: F                  HR:           67 bpm. Exam Location:  Inpatient Procedure: 2D Echo, Color Doppler and Cardiac Doppler Indications:     I49.01 Ventricular fibrillation  History:        Patient has prior history of Echocardiogram examinations, most                 recent 06/24/2017. CHF, CAD, Defibrillator; Risk                 Factors:Hypertension.  Sonographer:    Raquel Sarna Senior RDCS Referring Phys: [id number] Shirley Friar  Sonographer Comments: Technically difficult due to patient body habitus. IMPRESSIONS  1. Left ventricular ejection fraction, by estimation, is 45 to  50%. The left ventricle has mildly decreased function. The left ventricle demonstrates regional wall motion abnormalities (see scoring diagram/findings for description). Left ventricular diastolic parameters are consistent with Grade II diastolic dysfunction (pseudonormalization). There is mild hypokinesis of the left ventricular, mid-apical inferolateral wall.  2. Right ventricular systolic function is normal. The right ventricular size is normal. There is normal pulmonary artery systolic pressure.  3. Left atrial size was moderately dilated.  4. The mitral valve is normal in structure. Trivial mitral valve regurgitation. No evidence of mitral stenosis.  5. The aortic valve is tricuspid. There is mild calcification of the aortic valve. There is mild thickening of the aortic valve. Aortic valve regurgitation is not visualized. Mild aortic valve sclerosis is present, with no evidence of aortic valve stenosis.  6. The inferior vena cava is normal in size with <50% respiratory variability, suggesting right atrial pressure of 8 mmHg. Conclusion(s)/Recommendation(s): Mild WMA on short axis views of imd to distal inferior/inferolateral wall, not as well seen on apical views. Mildly reduced LVEF. FINDINGS  Left Ventricle: Left ventricular ejection fraction, by estimation, is 45 to 50%. The left ventricle has mildly decreased function. The left ventricle demonstrates regional wall motion abnormalities. Mild hypokinesis of the left ventricular, mid-apical inferolateral wall. The left  ventricular internal cavity size was normal in size. There is no left ventricular hypertrophy. Left ventricular diastolic parameters are consistent with Grade II diastolic dysfunction (pseudonormalization). Right Ventricle: The right ventricular size is normal. No increase in right ventricular wall thickness. Right ventricular systolic function is normal. There is normal pulmonary artery systolic pressure. The tricuspid regurgitant velocity is 2.13 m/s, and  with an assumed right atrial pressure of 8 mmHg, the estimated right ventricular systolic pressure is 05.3 mmHg. Left Atrium: Left atrial size was moderately dilated. Right Atrium: Right atrial size was normal in size. Pericardium: There is no evidence of pericardial effusion. Mitral Valve: The mitral valve is normal in structure. There is mild thickening of the mitral valve leaflet(s). There is mild calcification of the mitral valve leaflet(s). Trivial mitral valve regurgitation. No evidence of mitral valve stenosis. Tricuspid Valve: The tricuspid valve is normal in structure. Tricuspid valve regurgitation is mild . No evidence of tricuspid stenosis. Aortic Valve: The aortic valve is tricuspid. There is mild calcification of the aortic valve. There is mild thickening of the aortic valve. Aortic valve regurgitation is not visualized. Mild aortic valve sclerosis is present, with no evidence of aortic valve stenosis. Pulmonic Valve: The pulmonic valve was not well visualized. Pulmonic valve regurgitation is trivial. No evidence of pulmonic stenosis. Aorta: The aortic root and ascending aorta are structurally normal, with no evidence of dilitation. Venous: The inferior vena cava is normal in size with less than 50% respiratory variability, suggesting right atrial pressure of 8 mmHg. IAS/Shunts: No atrial level shunt detected by color flow Doppler.  LEFT VENTRICLE PLAX 2D LVIDd:         5.00 cm      Diastology LVIDs:         4.10 cm      LV e' medial:    6.74 cm/s  LV PW:         1.00 cm      LV E/e' medial:  11.8 LV IVS:        0.80 cm      LV e' lateral:   5.44 cm/s LVOT diam:     1.90 cm      LV E/e' lateral: 14.6 LV SV:  55 LV SV Index:   26 LVOT Area:     2.84 cm  LV Volumes (MOD) LV vol d, MOD A4C: 147.0 ml LV vol s, MOD A4C: 80.0 ml LV SV MOD A4C:     147.0 ml RIGHT VENTRICLE RV S prime:     14.30 cm/s TAPSE (M-mode): 2.5 cm LEFT ATRIUM             Index       RIGHT ATRIUM           Index LA diam:        4.00 cm 1.90 cm/m  RA Area:     15.60 cm LA Vol (A2C):   83.4 ml 39.52 ml/m RA Volume:   33.10 ml  15.68 ml/m LA Vol (A4C):   53.8 ml 25.49 ml/m LA Biplane Vol: 67.4 ml 31.94 ml/m  AORTIC VALVE LVOT Vmax:   71.80 cm/s LVOT Vmean:  57.300 cm/s LVOT VTI:    0.194 m  AORTA Ao Root diam: 3.20 cm Ao Asc diam:  3.10 cm MITRAL VALVE               TRICUSPID VALVE MV Area (PHT): 2.76 cm    TR Peak grad:   18.1 mmHg MV Decel Time: 275 msec    TR Vmax:        213.00 cm/s MV E velocity: 79.30 cm/s MV A velocity: 60.40 cm/s  SHUNTS MV E/A ratio:  1.31        Systemic VTI:  0.19 m                            Systemic Diam: 1.90 cm Buford Dresser MD Electronically signed by Buford Dresser MD Signature Date/Time: 05/01/2020/7:38:29 PM    Final     Patient Profile     Lisa [name] is a 79 y.o. 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 Percival Spanish.   Assessment & Plan    1.  PMVT with appropriate therapy Continue IV amiodarone today.  Continue coreg 25 mg BID K 3.3 on arrival. Supped. Repeat pending.  Mg 1.7. Supped. Repeat pending.  Echo 05/01/2020 shows LVEF 45-50%, which is stable from Myoview earlier this year (estimated 47%) No driving x 6 months per [facility]CD[facility] Guidelines Suspect mild HS troponin elevation in  setting of ICD shock. She has some long shorting associated with her VT both this time and in February. She also had optivol elevation prior to both VT events. Her optivol shows improvement since going back on torsemide.  2. CAD PCI of LAD in 09/2015 Myoview 09/05/2019 LVEF 47%, no ischemia or prior infarct.  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 Denies ischemic symptoms  3. 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  Volume status difficult with lymphedema.   4. Subclinical AF Short duration of AF noted in the past.  CHA2DS2VASC of at least 6. With no stroke or h/o TE, [place] has been deferred due to overall very low burden.    No change. Continue to monitor for burden.   5. HTN  Stable.  6. CML She is on Imatinib (GLEEVEC) currently and reports that she is "responding" Imatinib can rarely be associated with systolic dysfunction. She has been on this since at least 2018.  7. Chronic lymphedema Slightly worse than baseline per pt.  8. Hypokalemia Repeat pending this am.   9. OSA She has been diagnosed but has not followed up for CPAP titration. Encouraged to follow up. No change.   For questions or updates, please contact [place] Please consult [email or web address] for contact info under Cardiology/STEMI.  Signed, Shirley Friar, PA-C  05/02/2020, 8:07 AM   EP Attending  Patient seen and examined. Agree with above. The patient has an amiodarone deficiency. She is stable on IV amiodarone. We will continue IV amiodarone today and switch to oral amiodarone 400 bid for 1-2 weeks then 400 mg daily. I would keep her on 400 mg daily if tolerated for 2-3 months.  Carleene Overlie [place],MD

## 2020-05-02 NOTE — Progress Notes (Addendum)
Subjective: Lisa [name] reports that she is doing well this morning. She is experiencing some soreness in her neck and left knee which she attributes to her fall and would like a topical pain reliever.She has noticed that the swelling in her legs and hands have increased. She does not report any chest pain, unusual dyspnea, headaches or abdominal pain.  Objective:  Vital signs in last 24 hours: Vitals:   05/01/20 2333 05/02/20 0006 05/02/20 0512 05/02/20 0808  BP: (!) 115/36  (!) 101/39 (!) 128/58  Pulse: 71  74 68  Resp: 18  16 16   Temp: 99.4 F (37.4 C)  98.9 F (37.2 C) 98.4 F (36.9 C)  TempSrc: Oral  Oral Oral  SpO2: 99%  96% 99%  Weight:  116.7 kg    Height:  5\' 2"  (1.575 m)     Weight change: 0.544 kg  Intake/Output Summary (Last 24 hours) at 05/02/2020 1057 Last data filed at 05/02/2020 0900 Gross per 24 hour  Intake 718.99 ml  Output --  Net 718.99 ml   Physical Exam Constitutional:      General: She is awake. She is not in acute distress.    Appearance: She is obese.     Comments: Sitting up, conversant.  HENT:     Head: Normocephalic. Laceration present.     Comments: Healing 8-9 cm laceration on L. Forehead Cardiovascular:     Rate and Rhythm: Normal rate and regular rhythm.     Heart sounds: Normal heart sounds.  Pulmonary:     Effort: Pulmonary effort is normal.     Breath sounds: Normal breath sounds.  Chest:     Chest wall: No tenderness.     Comments: Palpable ICD Musculoskeletal:        General: Swelling present.     Cervical back: Normal range of motion. Pain with movement and muscular tenderness present.     Right lower leg: Edema present.     Left lower leg: Edema present.     Right foot: Swelling present.     Left foot: Swelling present.     Comments: Lymphedema hyperkeratosis and papillomas present  Skin:    General: Skin is warm and dry.  Neurological:     General: No focal deficit present.     Mental Status: She is alert and oriented  to person, place, and time.  Psychiatric:        Mood and Affect: Mood normal.        Behavior: Behavior normal. Behavior is cooperative.      Assessment/Plan:  Principal Problem:   Cardiac arrest [facility]) Active Problems:   Essential hypertension   Acute on chronic systolic heart failure (HCC)   Sustained VT (ventricular tachycardia) ([place])  Lisa [name] is 79 yo female with a history of HTN, HLD, LBBB, CHF (EF 45-50%), history of V.fib/VT w/AICD, brief A.Fib (not on anticoag), CKD stage IIIb and obesity who presented to [facility] after experiencing after a fall with nausea/vomiting after an acute syncopal episode and admitted for cardiac arrest secondary to PMVT s/p successful termination from ICD shock.   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 ICD interrogation revealed appropriate ICD shock after PMVT.  She does not report any chest pain. Cardiology consulted, planning to treat with IV amiodarone and carvedilol. We will continue to monitor her electrolytes and replenish as needed.  -- continue IV Amiodarone load, followed by transition to 400mg  PO  BID -- Continue Carvedilol 25mg  -- Mg labs pending  Chronic Heart Failure with mildly reduced EF  Echocardiogram (05/01/20) showed EF of 45-50%. Her Lasix was discontinued due to worsening renal function and after reporting a recent 20lb weight gain, Torsemide 20mg  was added. She reports some dyspnea on exertion and mild orthopnea. Physical Exam was significant for bilateral lymphedema. We will continue her current diuretic, although she appears hypervolemic on exam and may benefit from IV diuresis. We will discuss with Cardiology.  --continue Torsemide 20mg   CAD Cardiac Cath(09/22/19) revealed non-obstructive CAD and mild LV dysfunction. On admission, she had elevated Troponin of 190, this was most likely due to ICD shock. We discontinued heparin drip. We  will continue to monitor her for cardiac symptoms.   Hypertension BP has been normotensive during admission. She is currently taking Carvedilol as above. We continue to monitor. --Vital signs  CKD stage IIIb On admission, BUN/creatinine were 18/1.32. She had mild hypokalemia of 3.3 and received potassium chloride 69mEq. We will contine to monitor labs and electrolytes.  --CMP pending  CML She currently taking Imatnib (Gleevac). Follows with Oncology and has follow up 11/21.  --continue Imatininb  Anemia Hbg has decreased from 9.2>8.4 since admission. Her anemia is most likely secondary to anemia of chronic disease. We will continue to monitor hemoglobin levels --morning CBC   Hypothyroidism She is currently taking Thyroid ([place]) 15mg . We will continue during admission --continue Thyroid ([place]) 15mg    Lymphedema Significant bilateral non-pitting lower extremity edema with thickened skin and papillomas. Seems chronic in nature. Per Chart Review, she does not appear to have a history of PAD, we will consider compression therapy. --compression socks      LOS: 1 day   Lisa [name], Medical Student 05/02/2020, 10:57 AM

## 2020-05-03 LAB — CBC
HCT: 25.8 % — ABNORMAL LOW (ref 36.0–46.0)
Hemoglobin: 8.1 g/dL — ABNORMAL LOW (ref 12.0–15.0)
MCH: 32.4 pg (ref 26.0–34.0)
MCHC: 31.4 g/dL (ref 30.0–36.0)
MCV: 103.2 fL — ABNORMAL HIGH (ref 80.0–100.0)
Platelets: 158 10*3/uL (ref 150–400)
RBC: 2.5 MIL/uL — ABNORMAL LOW (ref 3.87–5.11)
RDW: 13.7 % (ref 11.5–15.5)
WBC: 5.9 10*3/uL (ref 4.0–10.5)
nRBC: 0 % (ref 0.0–0.2)

## 2020-05-03 LAB — BASIC METABOLIC PANEL
Anion gap: 5 (ref 5–15)
BUN: 15 mg/dL (ref 8–23)
CO2: 28 mmol/L (ref 22–32)
Calcium: 8.1 mg/dL — ABNORMAL LOW (ref 8.9–10.3)
Chloride: 106 mmol/L (ref 98–111)
Creatinine, Ser: 1.36 mg/dL — ABNORMAL HIGH (ref 0.44–1.00)
GFR calc Af Amer: 43 mL/min — ABNORMAL LOW (ref >60)
GFR calc non Af Amer: 37 mL/min — ABNORMAL LOW (ref >60)
Glucose, Bld: 118 mg/dL — ABNORMAL HIGH (ref 70–99)
Potassium: 4.6 mmol/L (ref 3.5–5.1)
Sodium: 139 mmol/L (ref 135–145)

## 2020-05-03 LAB — MAGNESIUM
Magnesium: 1.7 mg/dL (ref 1.7–2.4)
Magnesium: 1.8 mg/dL (ref 1.7–2.4)

## 2020-05-03 MED ORDER — TORSEMIDE 20 MG PO TABS
20.0000 mg | ORAL_TABLET | Freq: Every day | ORAL | Status: DC
  Administered 2020-05-03 – 2020-05-04 (×2): 20 mg via ORAL
  Filled 2020-05-03 (×2): qty 1

## 2020-05-03 MED ORDER — POTASSIUM CHLORIDE CRYS ER 20 MEQ PO TBCR
20.0000 meq | EXTENDED_RELEASE_TABLET | Freq: Once | ORAL | Status: AC
  Administered 2020-05-04: 20 meq via ORAL
  Filled 2020-05-03: qty 1

## 2020-05-03 MED ORDER — MAGNESIUM SULFATE 2 GM/50ML IV SOLN
2.0000 g | Freq: Once | INTRAVENOUS | Status: AC
  Administered 2020-05-03: 2 g via INTRAVENOUS
  Filled 2020-05-03: qty 50

## 2020-05-03 MED ORDER — AMIODARONE HCL 200 MG PO TABS
400.0000 mg | ORAL_TABLET | Freq: Two times a day (BID) | ORAL | Status: DC
  Administered 2020-05-03 – 2020-05-04 (×3): 400 mg via ORAL
  Filled 2020-05-03 (×3): qty 2

## 2020-05-03 NOTE — Progress Notes (Addendum)
Electrophysiology Rounding Note  Patient Name: Lisa [name] Date of Encounter: 05/03/2020  Primary Cardiologist: [name], MD Electrophysiologist: Cristopher [name], MD   Subjective   The patient is doing well today.  At this time, the patient denies chest pain, shortness of breath, or any new concerns.  VT Quiescent.  Inpatient Medications    Scheduled Meds:  allopurinol  100 mg Oral Daily   amiodarone  400 mg Oral BID   aspirin EC  81 mg Oral QHS   atorvastatin  20 mg Oral Daily   carvedilol  25 mg Oral BID   enoxaparin (LOVENOX) injection  60 mg Subcutaneous Q24H   imatinib  400 mg Oral Q supper   thyroid  15 mg Oral QAC breakfast   Continuous Infusions:  amiodarone 30 mg/hr (05/03/20 0234)   PRN Meds: acetaminophen **OR** acetaminophen, diclofenac Sodium, ondansetron **OR** ondansetron (ZOFRAN) IV   Vital Signs    Vitals:   05/02/20 1130 05/02/20 1942 05/03/20 0500 05/03/20 0624  BP: (!) 117/42 (!) 148/58  (!) 133/48  Pulse: 61 65  69  Resp:  18  18  Temp:  98 F (36.7 C)  99 F (37.2 C)  TempSrc:  Oral  Oral  SpO2:  99%  98%  Weight:   114.7 kg   Height:        Intake/Output Summary (Last 24 hours) at 05/03/2020 0817 Last data filed at 05/03/2020 6440 Gross per 24 hour  Intake 730 ml  Output 2900 ml  Net -2170 ml   Filed Weights   05/01/20 0700 05/02/20 0006 05/03/20 0500  Weight: 116.1 kg 116.7 kg 114.7 kg    Physical Exam    GEN- The patient is well appearing, alert and oriented x 3 today.   Head- normocephalic, atraumatic Eyes-  Sclera clear, conjunctiva pink Ears- hearing intact Oropharynx- clear Neck- supple Lungs- Clear to ausculation bilaterally, normal work of breathing Heart- Regular rate and rhythm, no murmurs, rubs or gallops GI- soft, NT, ND, + BS Extremities- no clubbing or cyanosis. No edema Skin- no rash or lesion Psych- euthymic mood, full affect Neuro- strength and sensation are intact  Labs    CBC Recent Labs     05/01/20 0257 05/01/20 0257 05/02/20 1047 05/03/20 0556  WBC 7.3   < > 6.1 5.9  NEUTROABS 4.9  --   --   --   HGB 9.2*   < > 8.4* 8.1*  HCT 30.3*   < > 28.1* 25.8*  MCV 102.7*   < > 104.9* 103.2*  PLT 174   < > 166 158   < > = values in this interval not displayed.   Basic Metabolic Panel Recent Labs    05/02/20 1047 05/03/20 0556  NA 141 139  K 4.5 4.6  CL 109 106  CO2 25 28  GLUCOSE 136* 118*  BUN 13 15  CREATININE 1.21* 1.36*  CALCIUM 8.3* 8.1*  MG 2.0 1.7   Liver Function Tests Recent Labs    05/02/20 1047  AST 21  ALT 18  ALKPHOS 52  BILITOT 0.4  PROT 5.4*  ALBUMIN 2.9*   No results for input(s): LIPASE, AMYLASE in the last 72 hours. Cardiac Enzymes No results for input(s): CKTOTAL, CKMB, CKMBINDEX, TROPONINI in the last 72 hours.   Telemetry    NSR (AS VP) in 70s, no further VT (personally reviewed)  Radiology    ECHOCARDIOGRAM COMPLETE  Result Date: 05/01/2020    ECHOCARDIOGRAM REPORT   Patient  Name:   Lisa [name] Date of Exam: 05/01/2020 Medical Rec #:  [id number]          Height:       61.5 in Accession #:    [id number]         Weight:       256.0 lb Date of Birth:  1940-11-16           BSA:          2.110 m Patient Age:    79 years           BP:           134/56 mmHg Patient Gender: F                  HR:           67 bpm. Exam Location:  Inpatient Procedure: 2D Echo, Color Doppler and Cardiac Doppler Indications:    I49.01 Ventricular fibrillation  History:        Patient has prior history of Echocardiogram examinations, most                 recent 06/24/2017. CHF, CAD, Defibrillator; Risk                 Factors:Hypertension.  Sonographer:    Raquel Sarna Senior RDCS Referring Phys: [id number] Shirley Friar  Sonographer Comments: Technically difficult due to patient body habitus. IMPRESSIONS  1. Left ventricular ejection fraction, by estimation, is 45 to 50%. The left ventricle has mildly decreased function. The left ventricle demonstrates regional  wall motion abnormalities (see scoring diagram/findings for description). Left ventricular diastolic parameters are consistent with Grade II diastolic dysfunction (pseudonormalization). There is mild hypokinesis of the left ventricular, mid-apical inferolateral wall.  2. Right ventricular systolic function is normal. The right ventricular size is normal. There is normal pulmonary artery systolic pressure.  3. Left atrial size was moderately dilated.  4. The mitral valve is normal in structure. Trivial mitral valve regurgitation. No evidence of mitral stenosis.  5. The aortic valve is tricuspid. There is mild calcification of the aortic valve. There is mild thickening of the aortic valve. Aortic valve regurgitation is not visualized. Mild aortic valve sclerosis is present, with no evidence of aortic valve stenosis.  6. The inferior vena cava is normal in size with <50% respiratory variability, suggesting right atrial pressure of 8 mmHg. Conclusion(s)/Recommendation(s): Mild WMA on short axis views of imd to distal inferior/inferolateral wall, not as well seen on apical views. Mildly reduced LVEF. FINDINGS  Left Ventricle: Left ventricular ejection fraction, by estimation, is 45 to 50%. The left ventricle has mildly decreased function. The left ventricle demonstrates regional wall motion abnormalities. Mild hypokinesis of the left ventricular, mid-apical inferolateral wall. The left ventricular internal cavity size was normal in size. There is no left ventricular hypertrophy. Left ventricular diastolic parameters are consistent with Grade II diastolic dysfunction (pseudonormalization). Right Ventricle: The right ventricular size is normal. No increase in right ventricular wall thickness. Right ventricular systolic function is normal. There is normal pulmonary artery systolic pressure. The tricuspid regurgitant velocity is 2.13 m/s, and  with an assumed right atrial pressure of 8 mmHg, the estimated right ventricular  systolic pressure is 30.8 mmHg. Left Atrium: Left atrial size was moderately dilated. Right Atrium: Right atrial size was normal in size. Pericardium: There is no evidence of pericardial effusion. Mitral Valve: The mitral valve is normal in structure. There is mild thickening of the mitral valve leaflet(s).  There is mild calcification of the mitral valve leaflet(s). Trivial mitral valve regurgitation. No evidence of mitral valve stenosis. Tricuspid Valve: The tricuspid valve is normal in structure. Tricuspid valve regurgitation is mild . No evidence of tricuspid stenosis. Aortic Valve: The aortic valve is tricuspid. There is mild calcification of the aortic valve. There is mild thickening of the aortic valve. Aortic valve regurgitation is not visualized. Mild aortic valve sclerosis is present, with no evidence of aortic valve stenosis. Pulmonic Valve: The pulmonic valve was not well visualized. Pulmonic valve regurgitation is trivial. No evidence of pulmonic stenosis. Aorta: The aortic root and ascending aorta are structurally normal, with no evidence of dilitation. Venous: The inferior vena cava is normal in size with less than 50% respiratory variability, suggesting right atrial pressure of 8 mmHg. IAS/Shunts: No atrial level shunt detected by color flow Doppler.  LEFT VENTRICLE PLAX 2D LVIDd:         5.00 cm      Diastology LVIDs:         4.10 cm      LV e' medial:    6.74 cm/s LV PW:         1.00 cm      LV E/e' medial:  11.8 LV IVS:        0.80 cm      LV e' lateral:   5.44 cm/s LVOT diam:     1.90 cm      LV E/e' lateral: 14.6 LV SV:         55 LV SV Index:   26 LVOT Area:     2.84 cm  LV Volumes (MOD) LV vol d, MOD A4C: 147.0 ml LV vol s, MOD A4C: 80.0 ml LV SV MOD A4C:     147.0 ml RIGHT VENTRICLE RV S prime:     14.30 cm/s TAPSE (M-mode): 2.5 cm LEFT ATRIUM             Index       RIGHT ATRIUM           Index LA diam:        4.00 cm 1.90 cm/m  RA Area:     15.60 cm LA Vol (A2C):   83.4 ml 39.52 ml/m RA  Volume:   33.10 ml  15.68 ml/m LA Vol (A4C):   53.8 ml 25.49 ml/m LA Biplane Vol: 67.4 ml 31.94 ml/m  AORTIC VALVE LVOT Vmax:   71.80 cm/s LVOT Vmean:  57.300 cm/s LVOT VTI:    0.194 m  AORTA Ao Root diam: 3.20 cm Ao Asc diam:  3.10 cm MITRAL VALVE               TRICUSPID VALVE MV Area (PHT): 2.76 cm    TR Peak grad:   18.1 mmHg MV Decel Time: 275 msec    TR Vmax:        213.00 cm/s MV E velocity: 79.30 cm/s MV A velocity: 60.40 cm/s  SHUNTS MV E/A ratio:  1.31        Systemic VTI:  0.19 m                            Systemic Diam: 1.90 cm Buford Dresser MD Electronically signed by Buford Dresser MD Signature Date/Time: 05/01/2020/7:38:29 PM    Final     Patient Profile     Lisa [name] is a 79 y.o. 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 Percival Spanish.  Assessment & Plan    1.  PMVT with appropriate therapy Transition to po amiodarone 400 mg BID for at least 1 week. Observe 1 more night at least.  Continue coreg 25 mg BID K 4.6. Mg 1.7. Supp Mg.  Echo 05/01/2020 shows LVEF 45-50%, which is stable from Myoview earlier this year (estimated 47%) No driving x 6 months per [facility] Guidelines Mild HS troponin elevation in setting of ICD shock. She has some long shorting associated with her VT both this time and in February. She also had optivol elevation prior to both VT events. Her optivol shows improvement since going back on torsemide.   2. CAD No ischemic symptoms.    3. 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  Volume status difficult with lymphedema.   4. Subclinical AF Short duration of AF noted in the past.    CHA2DS2VASC of at least 6. With no stroke or h/o TE, [place] has been deferred due to overall very low burden.    No change. Continue to monitor for burden.    7. Chronic lymphedema Slightly worse than baseline per pt. Improved somewhat with torsemide and dose of IV lasix. Will give oral torsemide this am. Would order for daily now and follow Cr and K closely.    8. Hypokalemia K 4.6 this am.    For questions or updates, please contact [place] Please consult [email or web address] for contact info under Cardiology/STEMI.  Signed, Shirley Friar, PA-C  05/03/2020, 8:17 AM   EP Attending  Patient seen and examined. Agree with the findings as noted above. Her exam is as documented above. She has minimal rales. Her tele demonstrates no VT. She will transition to po amiodarone today and then discharge home tomorrow if VT is quiet.  Carleene Overlie Mateen Franssen,MD

## 2020-05-03 NOTE — Plan of Care (Signed)
Alert and oriented X4. Able to communicate needs. Skin warm and dry, lymphedema to bilateral lower extremities. Call bell within reach. No respiratory distress noted at this time

## 2020-05-03 NOTE — Progress Notes (Signed)
Remote ICD transmission.   

## 2020-05-03 NOTE — Progress Notes (Signed)
HD#2 Subjective:  Overnight Events: none  Patient examined at bedside. No longer having neck and left knee pain. She has not noticed much change in swelling in leg and hands. Denies chest pain, sob, or abdominal pain.  Objective:  Vital signs in last 24 hours: Vitals:   05/02/20 1130 05/02/20 1942 05/03/20 0500 05/03/20 0624  BP: (!) 117/42 (!) 148/58  (!) 133/48  Pulse: 61 65  69  Resp:  18  18  Temp:  98 F (36.7 C)  99 F (37.2 C)  TempSrc:  Oral  Oral  SpO2:  99%  98%  Weight:   114.7 kg   Height:       Supplemental O2: Room Air SpO2: 98 %   Physical Exam:  Physical Exam  Filed Weights   05/01/20 0700 05/02/20 0006 05/03/20 0500  Weight: 116.1 kg 116.7 kg 114.7 kg     Intake/Output Summary (Last 24 hours) at 05/03/2020 0717 Last data filed at 05/03/2020 9811 Gross per 24 hour  Intake 730 ml  Output 2900 ml  Net -2170 ml   Net IO Since Admission: -1,571.01 mL [05/03/20 0717]  Pertinent Labs: CBC Latest Ref Rng & Units 05/03/2020 05/02/2020 05/01/2020  WBC 4.0 - 10.5 K/uL 5.9 6.1 7.3  Hemoglobin 12.0 - 15.0 g/dL 8.1(L) 8.4(L) 9.2(L)  Hematocrit 36 - 46 % 25.8(L) 28.1(L) 30.3(L)  Platelets 150 - 400 K/uL 158 166 174    CMP Latest Ref Rng & Units 05/03/2020 05/02/2020 05/01/2020  Glucose 70 - 99 mg/dL 118(H) 136(H) 145(H)  BUN 8 - 23 mg/dL 15 13 18   Creatinine 0.44 - 1.00 mg/dL 1.36(H) 1.21(H) 1.32(H)  Sodium 135 - 145 mmol/L 139 141 141  Potassium 3.5 - 5.1 mmol/L 4.6 4.5 3.3(L)  Chloride 98 - 111 mmol/L 106 109 105  CO2 22 - 32 mmol/L 28 25 24   Calcium 8.9 - 10.3 mg/dL 8.1(L) 8.3(L) 8.9  Total Protein 6.5 - 8.1 g/dL - 5.4(L) -  Total Bilirubin 0.3 - 1.2 mg/dL - 0.4 -  Alkaline Phos 38 - 126 U/L - 52 -  AST 15 - 41 U/L - 21 -  ALT 0 - 44 U/L - 18 -    Imaging: No results found.  Assessment/Plan:   Principal Problem:   Cardiac arrest [facility]) Active Problems:   Essential hypertension   Acute on chronic systolic heart failure (HCC)   Sustained VT  (ventricular tachycardia) [facility])   Patient Summary: Ms. Wainwright is 79 yo female with a history of HTN, HLD, LBBB, CHF (EF 45-50%), history of V.fib/VT w/AICD, brief A.Fib (not on anticoag), CKD stage IIIb and obesity who presented to [facility] after experiencing after a fall with nausea/vomiting after an acute syncopal episode and admitted for cardiac arrest secondary to PMVT s/p successful termination from ICD shock.   Cardiac Arrest 2/2 Polymorphic Ventricular Tachycardia She continues to feel well. Cardiology consulted transitioned to PO amiodarone 400 mg BID. We will continue to monitor her electrolytes and replenish as needed.  -- Continue Amiodarone 400mg  PO BID -- Continue Carvedilol 25mg  -- Mg 1.7 today,  repleted will recheck in am -- K improved to 4.6 today -- PT/OT  Chronic Heart Failure with mildly reduced EF  Echocardiogram (05/01/20) showed EF of 45-50%. On physical exam lower extremities with non pitting edema and chronic changes due to lymphedema volume status difficult to access given lymphedema. S/p 40 mg IV lasix.40 mg yesterday with 2.9 L output overnight. Will continue on torsemide. --continue Torsemide 20mg   Hypertension BP has been normotensive during admission. She is currently taking Carvedilol as above. We continue to monitor.  CKD stage IIIb Cr. 1.36 tdoay  Potassium improved to 4.6 after potassium chloride 41mEq. We will contine to monitor labs and electrolytes.  --CMP  tomorrow  CML She currently taking Imatnib (Gleevac). Follows with Oncology and has follow up 11/21.  --continue Imatininb  Anemia Hbg today is 8.1. Her anemia is most likely secondary to anemia of chronic disease. We will continue to monitor hemoglobin levels --morning CBC   Hypothyroidism She is currently taking Thyroid (Armour) 15mg . We will continue during admission --continue Thyroid (Armour) 15mg    Diet: Heart Healthy IVF: None,None VTE: Enoxaparin Code: Full PT/OT recs:  Pending, none. TOC recs: Pending  Dispo: Anticipated discharge to Home in 1 days. Iona Beard, MD 05/03/2020, 7:17 AM Pager: [phone]  Please contact the on call pager after 5 pm and on weekends at [phone].

## 2020-05-04 LAB — BASIC METABOLIC PANEL
Anion gap: 7 (ref 5–15)
BUN: 19 mg/dL (ref 8–23)
CO2: 27 mmol/L (ref 22–32)
Calcium: 8.5 mg/dL — ABNORMAL LOW (ref 8.9–10.3)
Chloride: 105 mmol/L (ref 98–111)
Creatinine, Ser: 1.52 mg/dL — ABNORMAL HIGH (ref 0.44–1.00)
GFR calc Af Amer: 38 mL/min — ABNORMAL LOW (ref >60)
GFR calc non Af Amer: 32 mL/min — ABNORMAL LOW (ref >60)
Glucose, Bld: 115 mg/dL — ABNORMAL HIGH (ref 70–99)
Potassium: 4 mmol/L (ref 3.5–5.1)
Sodium: 139 mmol/L (ref 135–145)

## 2020-05-04 LAB — CBC
HCT: 27.2 % — ABNORMAL LOW (ref 36.0–46.0)
Hemoglobin: 8.6 g/dL — ABNORMAL LOW (ref 12.0–15.0)
MCH: 32.2 pg (ref 26.0–34.0)
MCHC: 31.6 g/dL (ref 30.0–36.0)
MCV: 101.9 fL — ABNORMAL HIGH (ref 80.0–100.0)
Platelets: 167 10*3/uL (ref 150–400)
RBC: 2.67 MIL/uL — ABNORMAL LOW (ref 3.87–5.11)
RDW: 13.4 % (ref 11.5–15.5)
WBC: 6.9 10*3/uL (ref 4.0–10.5)
nRBC: 0 % (ref 0.0–0.2)

## 2020-05-04 MED ORDER — AMIODARONE HCL 200 MG PO TABS: 200.0000 mg | ORAL_TABLET | Freq: Two times a day (BID) | ORAL | 0 refills | Status: DC

## 2020-05-04 MED ORDER — DICLOFENAC SODIUM 1 % EX GEL: 2.0000 g | Freq: Four times a day (QID) | CUTANEOUS | 0 refills | Status: AC | PRN

## 2020-05-04 NOTE — Progress Notes (Signed)
Subjective: Lisa [name] reports that she is feeling very well today. She denies any chest pain or unusual dyspnea. She reports that her neck pain is feeling better after using the Voltaren gel. She does not have any concerns and is ready to go home.   She reports some new left eye drainage, but denies any eye pain, itching, photophobia or pain with eye movements.  Objective:  Vital signs in last 24 hours: Vitals:   05/03/20 2111 05/04/20 0050 05/04/20 0406 05/04/20 0955  BP: (!) 112/43  (!) 106/42 (!) 128/48  Pulse: 72  73 70  Resp: 18  18 16   Temp: 98.9 F (37.2 C)  98.6 F (37 C) 98 F (36.7 C)  TempSrc: Oral  Oral Oral  SpO2: 98%  96%   Weight:  113.5 kg    Height:       Weight change: -1.225 kg  Intake/Output Summary (Last 24 hours) at 05/04/2020 1305 Last data filed at 05/04/2020 0049 Gross per 24 hour  Intake 300 ml  Output 3400 ml  Net -3100 ml  Physical Exam Constitutional:      General: She is not in acute distress.    Appearance: Normal appearance. She is obese.  HENT:     Head: Normocephalic.     Comments: Well-hearing scar on L.forehead Eyes:     General:        Left eye: Discharge present. Neck:   Cardiovascular:     Rate and Rhythm: Normal rate and regular rhythm.     Heart sounds: Normal heart sounds.  Pulmonary:     Effort: Pulmonary effort is normal.     Breath sounds: Normal breath sounds.  Musculoskeletal:        General: Normal range of motion.     Cervical back: Normal range of motion. No pain with movement.     Right lower leg: Swelling present.     Left lower leg: Swelling present.     Right foot: Swelling present.     Left foot: Swelling present.     Comments: Significant lymphedema with keratosis and papillomas present in lower extremities b/l  Neurological:     General: No focal deficit present.     Mental Status: She is alert and oriented to person, place, and time.  Psychiatric:        Mood and Affect: Mood normal.         Behavior: Behavior normal.        Thought Content: Thought content normal.        Judgment: Judgment normal.      Assessment/Plan:  Principal Problem:   Cardiac arrest (HCC) Active Problems:   Essential hypertension   Acute on chronic systolic heart failure (HCC)   Sustained VT (ventricular tachycardia) ([place])   Patient Summary: Lisa [name] is 79 yo female with a history of HTN, HLD, LBBB, CHF (EF 45-50%), history of V.fib/VT w/AICD, brief A.Fib (not on anticoag), CKD stage IIIb and obesity who presented to [facility] after experiencing after a fall with nausea/vomiting after an acute syncopal episode and admitted for cardiac arrest secondary to PMVT s/p successful termination from ICD shock   Cardiac Arrest 2/2 Polymorphic Ventricular Tachycardia She continues to feel well today with no cardiac symptoms. She will continue PO Amiodarone BID for one month and per Cardiology, transition to 200mg  daily. Cardiology has signed off and she has a follow-up scheduled with Dr. Cristopher [name].  --continue Amiodarone 400mg  BID for 30 days, switch to 200mg   daily --continue Carvedilol 25mg  --follow up with Cardiology   Chronic Heart Failurewith mildly reduced EF(45-50%) She is currently taking Torsemide 20mg  and weight has decreased from 114.7kg to 113.5kg . She reports reduced swelling her arms and legs. She does not report any dyspnea at rest, and is able to breathe well on room air.   --continue Torsemide 20mg  BID  Hypertension BP has remained normotensive during admission. She is taking Carvedilol 25mg  daily. She should continue and follow up with her PCP for BP management. --continue Carvedilol 25mg   CKD stage IIIb Creatinine is 1.52 today, up from 1.36 from yesterday's labs. Electrolytes remain stable. Elevation is most likely due to increased diuresis. We will decrease Torsemide from daily to BID and recommend close follow up with her PCP/Cardiologist optimization.  --Torsemide 20mg   BID  CML Currently taking Imatinib (Gleevac). She has follow up with Oncology.  Anemia Hemoglobin is 8.6 and has remained stable since admission. She should follow up with her PCP.   Hypothyroidism Currently taking Thyroid (Armour) 15mg . We continued this during admission. --continue Thyroid (Armour) 15mg   MSK (neck) Pain She has improved neck pain with Voltaren gel. We will continue her on this at discharge.  --Voltaren gel   Diet: Heart Healthy IVF: None,None VTE: Enoxaparin Code: Full PT/OT recs: None,none.  TOC recs: pending   Dispo: Anticipated discharge to Home in 0 days  LOS: 3 days   Jacklynn Bue, Medical Student 05/04/2020, 1:05 PM

## 2020-05-04 NOTE — Progress Notes (Signed)
Occupational Therapy Evaluation  Patient is at baseline, mod I with ambulation, toilet transfer, sink side grooming and eating breakfast at edge of bed. Patient reports ready to go home. No acute OT needs at this time. Please re-consult if new needs arise.    05/04/20 0700  OT Visit Information  Last OT Received On 05/04/20  Assistance Needed +1  History of Present Illness Patient is a 79 year old female with a history of HTN, HLD, LBBB, CHF (EF 45-50%), history of V.fib/VT w/AICD, brief A.Fib (not on anticoag), CKD stage IIIb and obesity who presented to [facility] after experiencing after a fall with nausea/vomiting after an acute syncopal episode and admitted for cardiac arrest secondary to PMVT s/p successful termination from ICD shock  Precautions  Precautions None  Restrictions  Weight Bearing Restrictions No  Home Living  Family/patient expects to be discharged to: Private residence  Living Arrangements Alone  Available Help at Discharge Family;Available PRN/intermittently  Type of Home House  Home Access Stairs to enter  Entrance Stairs-Number of Steps 3  Entrance Stairs-Rails None  Home Layout Two level;Able to live on main level with bedroom/bathroom  Barrister's clerk Tub bench;Cane - single point  Prior Function  Level of Independence Independent  Comments keeps cane in the car if needed   Communication  Communication No difficulties  Pain Assessment  Pain Assessment Faces  Faces Pain Scale 2  Pain Location neck  Pain Descriptors / Indicators Sore  Pain Intervention(s) Monitored during session  Cognition  Arousal/Alertness Awake/alert  Behavior During Therapy WFL for tasks assessed/performed  Overall Cognitive Status Within Functional Limits for tasks assessed  Upper Extremity Assessment  Upper Extremity Assessment Overall WFL for tasks assessed  Lower Extremity Assessment  Lower Extremity Assessment Defer to  PT evaluation  Cervical / Trunk Assessment  Cervical / Trunk Assessment Other exceptions  Cervical / Trunk Exceptions body habitus  ADL  Overall ADL's  Independent  General ADL Comments patient ambulate to bathroom, perform sink side g/h and transfer to EOB without physical assistance  Bed Mobility  Overal bed mobility Modified Independent  Transfers  Overall transfer level Modified independent  Balance  Overall balance assessment Mild deficits observed, not formally tested  OT - End of Session  Activity Tolerance Patient tolerated treatment well  Patient left Other (comment);with call bell/phone within reach (EOB)  Nurse Communication Mobility status  OT Assessment  OT Recommendation/Assessment Patient does not need any further OT services  OT Visit Diagnosis Other abnormalities of gait and mobility (R26.89);Pain  Pain - part of body  (neck)  OT Problem List Pain;Obesity  AM-PAC OT "6 Clicks" Daily Activity Outcome Measure (Version 2)  Help from another person eating meals? 4  Help from another person taking care of personal grooming? 4  Help from another person toileting, which includes using toliet, bedpan, or urinal? 4  Help from another person bathing (including washing, rinsing, drying)? 4  Help from another person to put on and taking off regular upper body clothing? 4  Help from another person to put on and taking off regular lower body clothing? 4  6 Click Score 24  OT Recommendation  Follow Up Recommendations No OT follow up  OT Equipment None recommended by OT  Acute Rehab OT Goals  Patient Stated Goal go home  OT Goal Formulation With patient  OT Time Calculation  OT Start Time (ACUTE ONLY) 0725  OT Stop Time ([place]) 4403  OT Time Calculation (min) 14 min  OT General Charges  $OT Visit 1 Visit  OT Evaluation  $OT Eval Low Complexity 1 Low  Written Expression  Dominant Hand Right   Lisa [name] OT OT pager: [phone]

## 2020-05-04 NOTE — Discharge Instructions (Signed)
No driving for 6 months. 

## 2020-05-04 NOTE — Progress Notes (Signed)
Progress Note  Patient Name: Lisa [name] Date of Encounter: 05/04/2020  Primary Cardiologist: [name], MD   Subjective   No chest pain or sob. Left arm a little tender.  Inpatient Medications    Scheduled Meds: . allopurinol  100 mg Oral Daily  . amiodarone  400 mg Oral BID  . aspirin EC  81 mg Oral QHS  . atorvastatin  20 mg Oral Daily  . carvedilol  25 mg Oral BID  . enoxaparin (LOVENOX) injection  60 mg Subcutaneous Q24H  . imatinib  400 mg Oral Q supper  . thyroid  15 mg Oral QAC breakfast  . torsemide  20 mg Oral Daily   Continuous Infusions:  PRN Meds: acetaminophen **OR** acetaminophen, diclofenac Sodium, ondansetron **OR** ondansetron (ZOFRAN) IV   Vital Signs    Vitals:   05/03/20 2111 05/04/20 0050 05/04/20 0406 05/04/20 0955  BP: (!) 112/43  (!) 106/42 (!) 128/48  Pulse: 72  73 70  Resp: 18  18 16   Temp: 98.9 F (37.2 C)  98.6 F (37 C) 98 F (36.7 C)  TempSrc: Oral  Oral Oral  SpO2: 98%  96%   Weight:  113.5 kg    Height:        Intake/Output Summary (Last 24 hours) at 05/04/2020 1110 Last data filed at 05/04/2020 0049 Gross per 24 hour  Intake 300 ml  Output 3400 ml  Net -3100 ml   Filed Weights   05/02/20 0006 05/03/20 0500 05/04/20 0050  Weight: 116.7 kg 114.7 kg 113.5 kg    Telemetry    nsr - Personally Reviewed  ECG    none - Personally Reviewed  Physical Exam   GEN: morbidly obese, no acute distress.   Neck: No JVD Cardiac: RRR, no murmurs, rubs, or gallops.  Respiratory: Clear to auscultation bilaterally. GI: Soft, nontender, non-distended  MS: No edema; No deformity. Minimal left arm erythema Neuro:  Nonfocal  Psych: Normal affect   Labs    Chemistry Recent Labs  Lab 05/02/20 1047 05/03/20 0556 05/04/20 0343  NA 141 139 139  K 4.5 4.6 4.0  CL 109 106 105  CO2 25 28 27   GLUCOSE 136* 118* 115*  BUN 13 15 19   CREATININE 1.21* 1.36* 1.52*  CALCIUM 8.3* 8.1* 8.5*  PROT 5.4*  --   --   ALBUMIN 2.9*   --   --   AST 21  --   --   ALT 18  --   --   ALKPHOS 52  --   --   BILITOT 0.4  --   --   GFRNONAA 43* 37* 32*  GFRAA 50* 43* 38*  ANIONGAP 7 5 7      Hematology Recent Labs  Lab 05/02/20 1047 05/03/20 0556 05/04/20 0343  WBC 6.1 5.9 6.9  RBC 2.68* 2.50* 2.67*  HGB 8.4* 8.1* 8.6*  HCT 28.1* 25.8* 27.2*  MCV 104.9* 103.2* 101.9*  MCH 31.3 32.4 32.2  MCHC 29.9* 31.4 31.6  RDW 13.9 13.7 13.4  PLT 166 158 167    Cardiac EnzymesNo results for input(s): TROPONINI in the last 168 hours. No results for input(s): TROPIPOC in the last 168 hours.   BNPNo results for input(s): BNP, PROBNP in the last 168 hours.   DDimer No results for input(s): DDIMER in the last 168 hours.   Radiology    No results found.  Cardiac Studies   none  Patient Profile     79 y.o. female admitted  with VF/syncope, s/p amiodarone load.  Assessment & Plan    1. VT/VF - she is stable since admit with amio load. She will be discharged on 200 mg of amio bid for a month, then 200 mg daily. She already has followup with me scheduled. [facility] will sign off.   Medication Recommendations:  none Other recommendations (labs, testing, etc):  none Follow up as an outpatient:  As scheduled  For questions or updates, please contact [facility] Please consult [email or web address] for contact info under Cardiology/STEMI.      Signed, Cristopher [name], MD  05/04/2020, 11:10 AM  Patient ID: Lisa [name], female   DOB: 08-29-1940, 79 y.o.   MRN: [id number]

## 2020-05-04 NOTE — Discharge Summary (Addendum)
Name: Lisa [name] MRN: [id number] DOB: 13-Dec-1940 79 y.o. PCP: [name], MD  Date of Admission: 05/01/2020  2:23 AM Date of Discharge:  05/04/2020 Attending Physician: Lalla Brothers, MD  Discharge Diagnosis: 1. Cardiac arrest 2. Polymorphic VT  3. Acute on chronic diastolic heart failure  Discharge Medications: Allergies as of 05/04/2020       Reactions   Flagyl [metronidazole] Itching, Rash, Other (See Comments)   Welts, also   Penicillins Hives, Itching, Rash   Has patient had a PCN reaction causing immediate rash, facial/tongue/throat swelling, SOB or lightheadedness with hypotension: Yes  Has patient had a PCN reaction causing severe rash involving mucus membranes or skin necrosis: Yes Has patient had a PCN reaction that required hospitalization No Has patient had a PCN reaction occurring within the last 10 years: NO If all of the above answers are "NO", then may proceed with Cephalosporin use.   Sulfamethoxazole Rash   Meperidine Nausea And Vomiting   Morphine And Related Nausea And Vomiting   Tape Other (See Comments)   "THE PLASTIC, CLEAR TAPE CAN/DOES PULL OFF MY SKIN."   Doxycycline Nausea Only, Rash, Other (See Comments)   Made her "feel terrible"   Lanolin Itching, Rash   Sulfa Antibiotics Rash        Medication List     TAKE these medications    acetaminophen 500 MG tablet Commonly known as: TYLENOL Take 500-1,000 mg by mouth every 6 (six) hours as needed for moderate pain.   allopurinol 100 MG tablet Commonly known as: ZYLOPRIM Take 100 mg by mouth daily.   amiodarone 200 MG tablet Commonly known as: PACERONE Take 1 tablet (200 mg total) by mouth 2 (two) times daily. What changed: when to take this   aspirin EC 81 MG tablet Take 81 mg by mouth at bedtime.   atorvastatin 20 MG tablet Commonly known as: LIPITOR Take 1 tablet (20 mg total) by mouth daily.   carvedilol 25 MG tablet Commonly known as: COREG Take 1 tablet (25 mg  total) by mouth 2 (two) times daily.   diclofenac Sodium 1 % Gel Commonly known as: VOLTAREN Apply 2 g topically 4 (four) times daily as needed for up to 10 days (neck pain).   imatinib 400 MG tablet Commonly known as: GLEEVEC Take 400 mg by mouth daily with supper.   Magnesium Oxide 400 MG Caps Take 1 capsule (400 mg total) by mouth 2 (two) times daily.   METAMUCIL CLEAR & NATURAL PO Take by mouth See admin instructions. Mix 1 teaspoonful into 8 ounces of water and drink once a day   multivitamin with minerals Tabs tablet Take 1 tablet by mouth daily.   nitroGLYCERIN 0.4 MG SL tablet Commonly known as: NITROSTAT Place 1 tablet (0.4 mg total) under the tongue every 5 (five) minutes x 3 doses as needed for chest pain.   thyroid 15 MG tablet Commonly known as: ARMOUR Take 15 mg by mouth daily before breakfast.   torsemide 20 MG tablet Commonly known as: DEMADEX Take 1 tablet (20 mg total) by mouth every other day.   VITAMIN D-3 PO Take 5,000 Units by mouth daily.        Disposition and follow-up:   Lisa [name] was discharged from [facility] in Stable condition.  At the hospital follow up visit please address:  1.  PMVT - Please insure she is tolerating amiodarone and not developing adverse affects  CHF- Ensure patient is diuresing well  on torsemide dose and is not developing kidney function of fluid retention  Left scalp hematoma - Please evaluate that her scalp hematoma and laceration are healing well and soreness improving.  2.  Labs / imaging needed at time of follow-up: CMP, CBC  3.  Pending labs/ test needing follow-up: none  Follow-up Appointments:  Follow-up Information     [name], MD Follow up on 05/28/2020.   Specialty: Cardiology Why: at 1030 for post hospital follow up Contact information: [address] [place] [address] [phone]         Fay Records, MD. Go on 06/03/2020.     Specialty: Cardiology Contact information: [place] [address] [place] by problem list:  Lisa [name] is 79 yo female with a history of HTN, HLD, LBBB, CHF (EF 45-50%), history of V.fib/VT w/AICD, brief A.Fib (not on anticoag), CKD stage IIIb and obesity who presented to [facility] after experiencing after a fall with nausea/vomiting after an acute syncopal episode and admitted for cardiac arrest secondary to PMVT s/p successful termination from ICD shock  Cardiac Arrest 2/2 Polymorphic Ventricular Tachycardia Presented to ED after syncopal episode with diaphoresis, nausea and vomiting. ICD interrogation revealed appropriate ICD shock after PMVT.  She did not report any chest pain. Cardiology consulted, and recommended Amiodarone bolus and then PO Amiodarone 200mg  BID for one month and per Cardiology,will transition to 200mg  daily and Carvedilol 25mg  daily. She remained stable without cardiac symptoms during admission. Cardiology signed off and she has a follow-up scheduled with Dr. Cristopher [name].   Chronic Heart Failure with mildly reduced EF (45-50%) Echocardiogram (05/01/20) showed EF of 45-50%. Her Lasix was discontinued due to worsening renal function and after reporting a recent 20lb weight gain, Torsemide 20mg  was added. She reports some dyspnea on exertion and mild orthopnea. Physical Exam was significant for bilateral lymphedema, and hard to access for volume status. She received 1 dose of Lasix 40mg . She was taking Torsemide 20mg  daily and weight has decreased from 114.7kg to 113.5kg during admission. She reported improvement in her swelling, did not report any new dyspnea and continued to breathe on room air without difficulty. She will take Torsemide 20mg  BID for diuresis outpatient.   Hypertension BP has remained normotensive during admission. She is taking Carvedilol 25mg  daily. She should continue and follow up with  her PCP for BP management.   CKD stage IIIb Creatinine is 1.52 today, up from 1.36 from yesterday's labs. Electrolytes remained stable. Elevation is most likely due to increased diuresis. We decreased Torsemide 20mg  daily to BID. She will need close follow up with PCP/Cardiologist for optimization.   CML Currently taking Imatinib (Gleevac). She has follow up with Oncology.  Anemia Hemoglobin is 8.6 and has remained stable since admission. She should follow up with her PCP.   Hypothyroidism Currently taking Thyroid (Armour) 15mg . We continued this during admission.  MSK (neck) pain She reports neck pain from her fall. She reports improvement with Voltaren gel. We continued her on it at discharge.    Discharge Vitals:   BP (!) 128/48 (BP Location: Right Arm)   Pulse 70   Temp 98 F (36.7 C) (Oral)   Resp 16   Ht 5\' 2"  (1.575 m)   Wt 113.5 kg   LMP 02/14/1995   SpO2 96%   BMI 45.76 kg/m  Pertinent Labs, Studies, and Procedures:  EXAM: CT HEAD WITHOUT CONTRAST   CT CERVICAL SPINE WITHOUT CONTRAST   TECHNIQUE: Multidetector CT imaging of the head and cervical spine was performed following the standard protocol without intravenous contrast. Multiplanar CT image reconstructions of the cervical spine were also generated.   COMPARISON:  None.   FINDINGS: CT HEAD FINDINGS   Brain: No evidence of acute infarction, hemorrhage, hydrocephalus, extra-axial collection or mass lesion/mass effect. Mild white matter low-density for age. Normal brain volume   Vascular: No hyperdense vessel or unexpected calcification.   Skull: Left lateral scalp injury with contusion and signs of laceration. No acute fracture   Sinuses/Orbits: No evidence of injury   CT CERVICAL SPINE FINDINGS   Alignment: Normal.   Skull base and vertebrae: No acute fracture. No primary bone lesion or focal pathologic process.   Soft tissues and spinal canal: No prevertebral fluid or swelling.  No visible canal hematoma.   Disc levels:  Ordinary degenerative changes for age   Upper chest: No evidence of injury   IMPRESSION: Scalp hematoma without calvarial fracture. No evidence of intracranial or cervical spine injury.     Electronically Signed   By: Monte Fantasia M.D.   On: 05/01/2020 04:24  CLINICAL DATA:  Syncopal episode with forehead hematoma   EXAM: CT HEAD WITHOUT CONTRAST   CT CERVICAL SPINE WITHOUT CONTRAST   TECHNIQUE: Multidetector CT imaging of the head and cervical spine was performed following the standard protocol without intravenous contrast. Multiplanar CT image reconstructions of the cervical spine were also generated.   COMPARISON:  None.   FINDINGS: CT HEAD FINDINGS   Brain: No evidence of acute infarction, hemorrhage, hydrocephalus, extra-axial collection or mass lesion/mass effect. Mild white matter low-density for age. Normal brain volume   Vascular: No hyperdense vessel or unexpected calcification.   Skull: Left lateral scalp injury with contusion and signs of laceration. No acute fracture   Sinuses/Orbits: No evidence of injury   CT CERVICAL SPINE FINDINGS   Alignment: Normal.   Skull base and vertebrae: No acute fracture. No primary bone lesion or focal pathologic process.   Soft tissues and spinal canal: No prevertebral fluid or swelling. No visible canal hematoma.   Disc levels:  Ordinary degenerative changes for age   Upper chest: No evidence of injury   IMPRESSION: Scalp hematoma without calvarial fracture. No evidence of intracranial or cervical spine injury.     Electronically Signed   By: Monte Fantasia M.D.   On: 05/01/2020 04:24  Discharge Instructions: Discharge Instructions     (HEART FAILURE PATIENTS) Call MD:  Anytime you have any of the following symptoms: 1) 3 pound weight gain in 24 hours or 5 pounds in 1 week 2) shortness of breath, with or without a dry hacking cough 3) swelling in the  hands, feet or stomach 4) if you have to sleep on extra pillows at night in order to breathe.   Complete by: As directed    Diet - low sodium heart healthy   Complete by: As directed    Discharge instructions   Complete by: As directed    You were hospitalized for arrhythmia. Thank you for allowing [place] to be part of your care.    Please follow up with the following providers:  Follow-up Information    [name], MD Follow up on 05/28/2020.   Specialty: Cardiology Why: at 1030 for post hospital follow up Contact information: [address]  [place] [address] [phone]        Fay Records, MD. Go on 06/03/2020.   Specialty: Cardiology Contact information: [place] [address] [phone]               Please note these changes made to your medications:   - Medications to continue: Please continue your home medications  - Medications to start: Please start taking amiodarone 200 twice daily Please take your torsemide 20 mg every other day. 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  Please call our clinic if you have any questions or concerns, we may be able to help and keep you from a long and expensive emergency room wait. Our clinic and after hours phone number is [phone], the best time to call is Monday through Friday 9 am to 4 pm but there is always someone available 24/7 if you have an emergency. If you need medication refills please notify your pharmacy one week in advance and they will send [place] a request.   Discharge instructions   Complete by: As directed    Please take amiodarone 200 mg twice a day for the next 30 days then start taking 200 mg one a day   Increase  activity slowly   Complete by: As directed    No wound care   Complete by: As directed        Signed: Iona Beard, MD 05/04/2020, 5:26 PM   Pager: [phone]

## 2020-05-04 NOTE — Hospital Course (Addendum)
Lisa [name] is 79 yo female with a history of HTN, HLD, LBBB, CHF (EF 45-50%), history of V.fib/VT w/AICD, brief A.Fib (not on anticoag), CKD stage IIIb and obesity who presented to [facility] after experiencing after a fall with nausea/vomiting after an acute syncopal episode and admitted for cardiac arrest secondary to PMVT s/p successful termination from ICD shock  Cardiac Arrest 2/2 Polymorphic Ventricular Tachycardia Presented to ED after syncopal episode with diaphoresis, nausea and vomiting. ICD interrogation revealed appropriate ICD shock after PMVT.  She did not report any chest pain. Cardiology consulted, and recommended Amiodarone bolus and then transition PO Amiodarone 400mg  BID. Per cardiology, on discharge will transition to 200mg  daily and Carvedilol 25mg  daily. She remained stable without cardiac symptoms during admission. Cardiology signed off and she has a follow-up scheduled with Dr. Cristopher [name].   Chronic Heart Failure with mildly reduced EF (45-50%) Echocardiogram (05/01/20) showed EF of 45-50%. Her Lasix was discontinued due to worsening renal function and after reporting a recent 20lb weight gain, Torsemide 20mg  every other day was added. She reports some dyspnea on exertion and mild orthopnea. Physical Exam was significant for bilateral lymphedema, and hard to access for volume status. She received 1 dose of Lasix 40mg  and Torsemide 20mg  daily and weight has decreased from 114.7kg to 113.5kg during admission. She reported improvement in her swelling, did not report any new dyspnea and continued to breathe on room air without difficulty. She will take Torsemide 20mg  every other day for diuresis outpatient.   Hypertension BP has remained normotensive during admission. She is taking Carvedilol 25mg  daily. She should continue and follow up with her PCP for BP management.  CKD stage IIIb Creatinine is 1.52 today, up from 1.36 from yesterday's labs. Electrolytes remained stable. Elevation is  most likely due to increased diuresis. We decreased Torsemide 20mg  daily to BID. She will need close follow up with PCP/Cardiologist for optimization.   CML Currently taking Imatinib (Gleevac). She has follow up with Oncology.  Anemia Hemoglobin is 8.6 and has remained stable since admission. She should follow up with her PCP.   Hypothyroidism Currently taking Thyroid (Armour) 15mg . We continued this during admission.  MSK (neck) pain She reports neck pain from her fall. She reports improvement with Voltaren gel. We continued her on it at discharge.

## 2020-05-04 NOTE — Evaluation (Addendum)
Physical Therapy Evaluation Patient Details Name: Lisa [name] MRN: [id number] DOB: [birth year] Today's Date: 05/04/2020   History of Present Illness  Patient is a 79 year old female with a history of HTN, HLD, LBBB, CHF (EF 45-50%), history of V.fib/VT w/AICD, brief A.Fib (not on anticoag), CKD stage IIIb and obesity who presented to [facility] after experiencing after a fall with nausea/vomiting after an acute syncopal episode and admitted for cardiac arrest secondary to PMVT s/p successful termination from ICD shock  Clinical Impression  Pt is at or close to baseline functioning and should be safe at home with PRN assist. There are no further acute PT needs.  Will sign off at this time.    Follow Up Recommendations No PT follow up    Equipment Recommendations  None recommended by PT    Recommendations for Other Services       Precautions / Restrictions Precautions Precautions: None Restrictions Weight Bearing Restrictions: No      Mobility  Bed Mobility Overal bed mobility: Modified Independent                Transfers Overall transfer level: Modified independent                  Ambulation/Gait Ambulation/Gait assistance: Modified independent (Device/Increase time) Gait Distance (Feet): 300 Feet Assistive device: None Gait Pattern/deviations: Step-through pattern   Gait velocity interpretation: 1.31 - 2.62 ft/sec, indicative of limited community ambulator General Gait Details: mild instability with fatigue/SOB (amb further than her usual.  Stairs Stairs: Yes Stairs assistance: Modified independent (Device/Increase time) Stair Management: One rail Right;One rail Left;Step to pattern;Forwards Number of Stairs: 3 General stair comments: appropriately safe step to pattern with rails  Wheelchair Mobility    Modified Rankin (Stroke Patients Only)       Balance Overall balance assessment: Needs assistance   Sitting balance-Leahy Scale: Normal        Standing balance-Leahy Scale: Good                   Standardized Balance Assessment Standardized Balance Assessment : Berg Balance Test;Dynamic Gait Index Berg Balance Test Sit to Stand: Able to stand without using hands and stabilize independently Standing Unsupported: Able to stand safely 2 minutes Sitting with Back Unsupported but Feet Supported on Floor or Stool: Able to sit safely and securely 2 minutes Stand to Sit: Sits safely with minimal use of hands Transfers: Able to transfer safely, minor use of hands Standing Unsupported with Eyes Closed: Able to stand 10 seconds with supervision Standing Ubsupported with Feet Together: Able to place feet together independently and stand for 1 minute with supervision From Standing Position, Pick up Object from Floor: Able to pick up shoe, needs supervision Turn 360 Degrees: Able to turn 360 degrees safely one side only in 4 seconds or less Dynamic Gait Index Level Surface: Normal Change in Gait Speed: Mild Impairment Gait with Horizontal Head Turns: Normal Gait with Vertical Head Turns: Mild Impairment Gait and Pivot Turn: Mild Impairment Step Over Obstacle: Normal Step Around Obstacles: Normal Steps: Mild Impairment Total Score: 20       Pertinent Vitals/Pain Pain Assessment: Faces Faces Pain Scale: Hurts a little bit Pain Location: L arm Pain Descriptors / Indicators: Sore Pain Intervention(s): Monitored during session;Heat applied    Home Living Family/patient expects to be discharged to:: Private residence Living Arrangements: Alone Available Help at Discharge: Family;Available PRN/intermittently Type of Home: House Home Access: Stairs to enter Entrance Stairs-Rails: None Entrance [employer]  of Steps: 3 Home Layout: Two level;Able to live on main level with bedroom/bathroom Home Equipment: Tub bench;Cane - single point      Prior Function Level of Independence: Independent         Comments: keeps  cane in the car if needed      Hand Dominance   Dominant Hand: Right    Extremity/Trunk Assessment   Upper Extremity Assessment Upper Extremity Assessment: Overall WFL for tasks assessed    Lower Extremity Assessment Lower Extremity Assessment: Overall WFL for tasks assessed (mild weakness in the hip flexors, L Quads with knee pain.)    Cervical / Trunk Assessment Cervical / Trunk Assessment: Other exceptions Cervical / Trunk Exceptions: body habitus  Communication   Communication: No difficulties  Cognition Arousal/Alertness: Awake/alert Behavior During Therapy: WFL for tasks assessed/performed Overall Cognitive Status: Within Functional Limits for tasks assessed                                        General Comments  Immediate return from ambulation in halls, SpO2 on RA 91% and EHR 91 bpm.  Mild dyspnea, pt able to converse while ambulating.    Exercises     Assessment/Plan    PT Assessment Patent does not need any further PT services  PT Problem List         PT Treatment Interventions      PT Goals (Current goals can be found in the Care Plan section)  Acute Rehab PT Goals Patient Stated Goal: go home PT Goal Formulation: All assessment and education complete, DC therapy    Frequency     Barriers to discharge        Co-evaluation               AM-PAC PT "6 Clicks" Mobility  Outcome Measure Help needed turning from your back to your side while in a flat bed without using bedrails?: None Help needed moving from lying on your back to sitting on the side of a flat bed without using bedrails?: None Help needed moving to and from a bed to a chair (including a wheelchair)?: None Help needed standing up from a chair using your arms (e.g., wheelchair or bedside chair)?: None Help needed to walk in hospital room?: None Help needed climbing 3-5 steps with a railing? : None 6 Click Score: 24    End of Session   Activity Tolerance:  Patient tolerated treatment well Patient left: in bed;with call bell/phone within reach Nurse Communication: Mobility status      Time: 1011-1036 PT Time Calculation (min) (ACUTE ONLY): 25 min   Charges:   PT Evaluation $PT Eval Moderate Complexity: 1 Mod PT Treatments $Gait Training: 8-22 mins        05/04/2020  Ginger Carne., PT Acute Rehabilitation Services [phone]  (pager) [phone]  (office)  Tessie Fass Enda Santo 05/04/2020, 10:45 AM

## 2020-05-08 LAB — AMIODARONE LEVEL
Amiodarone Lvl: 1704 ng/mL (ref 1000–2500)
N-Desethyl-Amiodarone: 499 ng/mL

## 2020-05-14 ENCOUNTER — Telehealth: Payer: Self-pay

## 2020-05-14 NOTE — Telephone Encounter (Signed)
1 NSVT episode noted on 05/09/20, total of 10 beats. No other episodes noted since d/c from hospital.  Lisa [name] has been elevated since August 2021, Per Dr. Tanna Furry note on 05/02/20, Optivol was elevated prior to both VT events. Showed improvement since going back on torsemide. Optivol is trending back up.

## 2020-05-14 NOTE — Progress Notes (Deleted)
CARDIOLOGY OFFICE NOTE  Date:  05/14/2020    Lisa [name] Date of Birth: 11/22/1940 Medical Record #[id number]  PCP:  [name], MD  Cardiologist:  Sarina Ill    No chief complaint on file.   History of Present Illness: Lisa [name] is a 79 y.o. female who presents today for a work in visit. Seen for Dr. Harrington Challenger and Lovena Le.  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 EF at that time was 25 to 30%. She has MDT BiV ICD in place. She has not tolerated ACE or Aldactone due to hyperkalemia.   In February she had VF arrest and ended up being placed on amiodarone. Brief AF noted on device - not placed on anticoagulation.   She was last seen by Dr. Harrington Challenger in August.  Had had 20# weight gain and diuretics were increased.   She presented last month to the ER with syncope - hit her head - unclear how long she was down. Woke up sweaty and weak - ended up vomiting. EMS was called. HGB was 9. Noted PMVT with successful termination from ICD shock. Amiodarone was increased to 200 mg BID.    Comes in today. Here with   Past Medical History:  Diagnosis Date  . CAD in native artery 2017   stent to LAD  . Cardiac arrest ([place]) 09/2015  . CHF (congestive heart failure) ([place])   . CML (chronic myelocytic leukemia) ([place])   . Complication of anesthesia   . Hx: UTI (urinary tract infection)   . Hypertension   . Melanoma ([place])   . PONV (postoperative nausea and vomiting)   . PVC (premature ventricular contraction)    HISTORY OF  . Thyroid disease   . V tach [facility])     Past Surgical History:  Procedure Laterality Date  . ABDOMINAL HYSTERECTOMY    . APPENDECTOMY    . CARDIAC CATHETERIZATION N/A 10/03/2015   Procedure: Left  Heart Cath and Coronary Angiography;  Surgeon: [name], MD;  Location: [place] CV LAB;  Service: Cardiovascular;  Laterality: N/A;  . CARDIAC CATHETERIZATION N/A 10/07/2015   Procedure: Coronary Stent Intervention;  Surgeon: [name], MD;  Location: [place] [facility];  Service: Cardiovascular;  Laterality: N/A;  . CARDIAC CATHETERIZATION  09/22/2019  . EP IMPLANTABLE DEVICE N/A 01/20/2016   Procedure: BiV ICD Insertion CRT-D;  Surgeon: [name], MD;  Location: [place] CV LAB;  Service: Cardiovascular;  Laterality: N/A;  . LEFT HEART CATH AND CORONARY ANGIOGRAPHY N/A 09/22/2019   Procedure: LEFT HEART CATH AND CORONARY ANGIOGRAPHY;  Surgeon: Martinique, Peter M, MD;  Location: [place] CV LAB;  Service: Cardiovascular;  Laterality: N/A;  . TONSILLECTOMY    . TONSILLECTOMY    . TUBAL LIGATION       Medications: No outpatient medications have been marked as taking for the 05/15/20 encounter (Appointment) with Burtis Junes, NP.     Allergies: Allergies  Allergen Reactions  . Flagyl [Metronidazole] Itching, Rash and Other (See Comments)    Welts, also  . Penicillins Hives, Itching and Rash    Has patient had a PCN reaction causing immediate rash, facial/tongue/throat swelling, SOB or lightheadedness with hypotension: Yes  Has patient had  a PCN reaction causing severe rash involving mucus membranes or skin necrosis: Yes Has patient had a PCN reaction that required hospitalization No Has patient had a PCN reaction occurring within the last 10 years: NO If all of the above answers are "NO", then may proceed with Cephalosporin use.   . Sulfamethoxazole Rash  . Meperidine Nausea And Vomiting  . Morphine And Related Nausea And Vomiting  . Tape Other (See Comments)    "THE PLASTIC, CLEAR TAPE CAN/DOES PULL OFF MY SKIN."  . Doxycycline Nausea Only, Rash and Other (See Comments)    Made her "feel terrible"  . Lanolin Itching and Rash  . Sulfa Antibiotics  Rash    Social History: The patient  reports that she has never smoked. She has never used smokeless tobacco. She reports that she does not drink alcohol and does not use drugs.   Family History: The patient's ***family history includes Coronary artery disease in her mother; Heart attack in her maternal grandfather and paternal grandfather; Multiple myeloma in her father; Thyroid disease in her mother.   Review of Systems: Please see the history of present illness.   All other systems are reviewed and negative.   Physical Exam: VS:  LMP 02/14/1995  .  BMI There is no height or weight on file to calculate BMI.  Wt Readings from Last 3 Encounters:  05/04/20 250 lb 3.2 oz (113.5 kg)  03/28/20 261 lb 3.2 oz (118.5 kg)  01/24/20 247 lb 9.6 oz (112.3 kg)    General: Pleasant. Well developed, well nourished and in no acute distress.   HEENT: Normal.  Neck: Supple, no JVD, carotid bruits, or masses noted.  Cardiac: ***Regular rate and rhythm. No murmurs, rubs, or gallops. No edema.  Respiratory:  Lungs are clear to auscultation bilaterally with normal work of breathing.  GI: Soft and nontender.  MS: No deformity or atrophy. Gait and ROM intact.  Skin: Warm and dry. Color is normal.  Neuro:  Strength and sensation are intact and no gross focal deficits noted.  Psych: Alert, appropriate and with normal affect.   LABORATORY DATA:  EKG:  EKG {ACTION; IS/IS DTO:[id number]} ordered today.  Personally reviewed by me. This demonstrates ***.  Lab Results  Component Value Date   WBC 6.9 05/04/2020   HGB 8.6 (L) 05/04/2020   HCT 27.2 (L) 05/04/2020   PLT 167 05/04/2020   GLUCOSE 115 (H) 05/04/2020   CHOL 92 (L) 10/06/2019   TRIG 92 10/06/2019   HDL 39 (L) 10/06/2019   LDLCALC 35 10/06/2019   ALT 18 05/02/2020   AST 21 05/02/2020   NA 139 05/04/2020   K 4.0 05/04/2020   CL 105 05/04/2020   CREATININE 1.52 (H) 05/04/2020   BUN 19 05/04/2020   CO2 27 05/04/2020   TSH 3.260 10/06/2019     INR 1.23 10/04/2015   HGBA1C 6.2 (H) 10/03/2015     BNP (last 3 results) Recent Labs    09/21/19 1930  BNP 709.1*    ProBNP (last 3 results) Recent Labs    04/04/20 1507  PROBNP 1,457*     Other Studies Reviewed Today:   ECHO IMPRESSIONS 04/2020  1. Left ventricular ejection fraction, by estimation, is 45 to 50%. The  left ventricle has mildly decreased function. The left ventricle  demonstrates regional wall motion abnormalities (see scoring  diagram/findings for description). Left ventricular  diastolic parameters are consistent with Grade II diastolic dysfunction  (pseudonormalization). There is mild hypokinesis of the left ventricular,  mid-apical inferolateral wall.  2. Right ventricular systolic function is normal. The right ventricular  size is normal. There is normal pulmonary artery systolic pressure.  3. Left atrial size was moderately dilated.  4. The mitral valve is normal in structure. Trivial mitral valve  regurgitation. No evidence of mitral stenosis.  5. The aortic valve is tricuspid. There is mild calcification of the  aortic valve. There is mild thickening of the aortic valve. Aortic valve  regurgitation is not visualized. Mild aortic valve sclerosis is present,  with no evidence of aortic valve  stenosis.  6. The inferior vena cava is normal in size with <50% respiratory  variability, suggesting right atrial pressure of 8 mmHg.    Cardiac cath 10/01/19   Mid LM to Dist LM lesion is 20% stenosed.  Previously placed Mid LAD stent (unknown type) is widely patent.  Ramus lesion is 65% stenosed.  The left ventricular systolic function is normal.  LV end diastolic pressure is moderately elevated.  The left ventricular ejection fraction is 50-55% by visual estimate.  1. Nonobstructive CAD. The stent in the mid LAD is widely patent. There is modest ostial disease in a small ramus branch that is unchanged from 2017. 2. Mild LV  dysfunction. EF 50%. 3. Elevated LVEDP 23 mm Hg.  Plan; medical therapy.   Myoview stress test 09/05/19  Nuclear stress EF: 47%. Visually, the EF appears to be greater than the computer generated EF of 47%.  There was no ST segment deviation noted during stress.  This is a low risk study.  The study is normal. There is no evidence of ischemia or previous infarction.    Assessment and Plan:      1. VT/VF - she is stable since admit with amio load. She will be discharged on 200 mg of amio bid for a month, then 200 mg daily. She already has followup with me scheduled.   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 ICD interrogation revealed appropriate ICD shock after PMVT. She did not report any chest pain. Cardiology consulted, and recommended Amiodarone bolus and then PO Amiodarone 238m BID for one month and per Cardiology,will transition to 2067mdaily and Carvedilol 2576maily. She remained stable without cardiac symptoms during admission. Cardiology signed off and she has a follow-up scheduled with Dr. GreCristopher [name] Chronic Heart Failurewith mildly reduced EF(45-50%) Echocardiogram (05/01/20) showed EF of 45-50%. Her Lasix was discontinued due to worsening renal function and after reporting a recent 20lb weight gain, Torsemide 48m51ms added. She reports some dyspnea on exertion and mild orthopnea. Physical Exam was significant for bilaterallymphedema, and hard to access for volume status. She received 1 dose of Lasix 40mg37me was taking Torsemide 48mg 40my and weight has decreased from 114.7kg to 113.5kg during admission. She reported improvement in her swelling, did not report any new dyspnea and continued to breathe on room air without difficulty. She will take Torsemide 48mg B25mor diuresis outpatient.   Hypertension BP has remained normotensive during admission. She is taking  Carvedilol 25mg da55m She should continue and follow up with her PCP for BP management.   CKD stage IIIb Creatinine is 1.52 today, up from 1.36 from yesterday's labs. Electrolytes remained stable. Elevation is most likely due to increased diuresis. We decreased Torsemide 48mg dai39mo BID. She will need close follow up with PCP/Cardiologist for optimization.   CML Currently taking Imatinib (Gleevac). She has follow  up with Oncology.  Anemia Hemoglobin is 8.6 and has remained stable since admission. She should follow up with her PCP.   Hypothyroidism Currently taking Thyroid (Armour) 63m. We continued this during admission.  MSK (neck) pain She reports neck pain from her fall. She reports improvement with Voltaren gel. We continued her on it at discharge  Current medicines are reviewed with the patient today.  The patient does not have concerns regarding medicines other than what has been noted above.  The following changes have been made:  See above.  Labs/ tests ordered today include:   No orders of the defined types were placed in this encounter.    Disposition:   FU with *** in {gen number 04-23:536144}{Days to years:10300}.   Patient is agreeable to this plan and will call if any problems develop in the interim.   Signed:Truitt Merle NP  05/14/2020 3:43 PM  [place]  [id number]: [phone]: [phone]

## 2020-05-14 NOTE — Telephone Encounter (Addendum)
Returned call to Pt.  Pt states she has had lightheadedness since discharging from the hospital.  Not "really dizzy but a feeling that I am not stable".  She also complains of some light chest pressure.  She states her blood pressures having been running higher than normal.  Systolic readings 172-419 and diastolic 89.  Will have Pt send a remote check to ensure Pt is not having any arrhythmias.  She is currently taking amiodarone 200 mg BID.  Also has a low hemoglobin.

## 2020-05-14 NOTE — Telephone Encounter (Signed)
Advised Pt that her remote check showed just a short run of tachycardia.  Made appt for Pt to be seen tomorrow with LG 05/15/2020 at 3:15 pm.  Pt will see if she can get a ride since she can't drive.  She will call back if she cannot keep time.

## 2020-05-14 NOTE — Telephone Encounter (Signed)
Pt confirmed she will keep appt.  She will take an uber.

## 2020-05-14 NOTE — Telephone Encounter (Signed)
The pt wants Dr. Lovena Le nurse to give her a call at her convenience. She wants to talk to her about dizzy spells.

## 2020-05-15 ENCOUNTER — Ambulatory Visit (INDEPENDENT_AMBULATORY_CARE_PROVIDER_SITE_OTHER): Payer: Medicare Other | Admitting: Nurse Practitioner

## 2020-05-15 ENCOUNTER — Other Ambulatory Visit: Payer: Self-pay

## 2020-05-15 ENCOUNTER — Ambulatory Visit: Payer: Medicare Other | Admitting: Nurse Practitioner

## 2020-05-15 ENCOUNTER — Encounter: Payer: Self-pay | Admitting: Nurse Practitioner

## 2020-05-15 VITALS — BP 126/72 | HR 68 | Ht 62.0 in | Wt 247.6 lb

## 2020-05-15 DIAGNOSIS — Z95810 Presence of automatic (implantable) cardiac defibrillator: Secondary | ICD-10-CM | POA: Diagnosis not present

## 2020-05-15 DIAGNOSIS — I255 Ischemic cardiomyopathy: Secondary | ICD-10-CM | POA: Diagnosis not present

## 2020-05-15 DIAGNOSIS — I5022 Chronic systolic (congestive) heart failure: Secondary | ICD-10-CM | POA: Diagnosis not present

## 2020-05-15 DIAGNOSIS — I4901 Ventricular fibrillation: Secondary | ICD-10-CM

## 2020-05-15 NOTE — Progress Notes (Signed)
CARDIOLOGY OFFICE NOTE  Date:  05/15/2020    [name] Date of Birth: 1941/06/21 Medical Record #[id number]  PCP:  [name], MD  Cardiologist:  Sarina Ill  Chief Complaint  Patient presents with  . Follow-up    Seen for Dr. Lovena Le & Harrington Challenger    History of Present Illness: Lisa [name] is a 79 y.o. female who presents today for a work in visit. Seen for Dr. Harrington Challenger & Lovena Le.   She has a history of morbid obesity, HTN, HLD, LBBB, CHF with prior EF down to 25 to 30% due to mixed NICM/ICM - improved to 45 to 50%, prior history of VF/VT w/AICD in place since 2017, brief A.Fib (not on anticoagulation), CKD stage IIIb and hypothyroidism. No ACE due to past issues with hyperkalemia. She is followed by [facility] for leukemia - has chronic anemia also.   She was last seen by Dr. Harrington Challenger here in August - weight was up - diuretics were increased.   She then presented last month to the hospital after a fall with N/V and acute syncope. Admitted for cardiac arrest secondary to PMVT with successful termination from ICD shock. She received IV amiodarone bolus and her oral dose was increased for one month. She was to see Dr. Lovena Le in one month.   She then called earlier this week with feeling lightheaded - thus added to my schedule for today. Her remote check showed just a short run of tachycardia yesterday. She is currently not able to drive due to the recent syncope.   Comes in today. Here alone. Felt lightheaded and little nauseated the past 3 days - but is better today. Eating did not seem to help. Lying down helped. No spinning sensation - just felt "off". She checked her BP - she is not sure how accurate her machine was - says it was "crazy". May have been 170/90 and then 150 and then down to 119. She felt ok at discharge. Her weight is down. Swelling she says has improved. She is always typically anemic. Looks to be moreso. Took UBER to get here.   Past Medical  History:  Diagnosis Date  . CAD in native artery 2017   stent to LAD  . Cardiac arrest ([place]) 09/2015  . CHF (congestive heart failure) ([place])   . CML (chronic myelocytic leukemia) ([employer])   . Complication of anesthesia   . Hx: UTI (urinary tract infection)   . Hypertension   . Melanoma ([place])   . PONV (postoperative nausea and vomiting)   . PVC (premature ventricular contraction)    HISTORY OF  . Thyroid disease   . V tach [facility])     Past Surgical History:  Procedure Laterality Date  . ABDOMINAL HYSTERECTOMY    . APPENDECTOMY    . CARDIAC CATHETERIZATION N/A 10/03/2015   Procedure: Left Heart Cath and Coronary Angiography;  Surgeon: [name], MD;  Location: [place] CV LAB;  Service: Cardiovascular;  Laterality: N/A;  . CARDIAC CATHETERIZATION N/A 10/07/2015   Procedure: Coronary Stent Intervention;  Surgeon: [name], MD;  Location: [place] CV LAB;  Service: Cardiovascular;  Laterality: N/A;  . CARDIAC CATHETERIZATION  09/22/2019  . EP IMPLANTABLE DEVICE N/A 01/20/2016   Procedure: BiV ICD Insertion CRT-D;  Surgeon: [name], MD;  Location: [place] CV LAB;  Service: Cardiovascular;  Laterality: N/A;  . LEFT HEART CATH AND CORONARY ANGIOGRAPHY N/A 09/22/2019   Procedure: LEFT HEART CATH  AND CORONARY ANGIOGRAPHY;  Surgeon: Martinique, Peter M, MD;  Location: [place] CV LAB;  Service: Cardiovascular;  Laterality: N/A;  . TONSILLECTOMY    . TONSILLECTOMY    . TUBAL LIGATION       Medications: Current Meds  Medication Sig  . acetaminophen (TYLENOL) 500 MG tablet Take 500-1,000 mg by mouth every 6 (six) hours as needed for moderate pain.  [name] allopurinol (ZYLOPRIM) 100 MG tablet Take 100 mg by mouth daily.   [name] amiodarone (PACERONE) 200 MG tablet Take 1 tablet (200 mg total) by mouth 2 (two) times daily.  [name] aspirin EC 81 MG tablet Take 81 mg by mouth at bedtime.   [name] atorvastatin (LIPITOR) 20 MG tablet Take 1 tablet (20 mg total) by mouth daily.  .  carvedilol (COREG) 25 MG tablet Take 1 tablet (25 mg total) by mouth 2 (two) times daily.  . Cholecalciferol (VITAMIN D-3 PO) Take 5,000 Units by mouth daily.  [name] imatinib (GLEEVEC) 400 MG tablet Take 400 mg by mouth daily with supper.   . Inulin (METAMUCIL CLEAR & NATURAL PO) Take by mouth See admin instructions. Mix 1 teaspoonful into 8 ounces of water and drink once a day  . Magnesium Oxide 400 MG CAPS Take 1 capsule (400 mg total) by mouth 2 (two) times daily.  . Multiple Vitamin (MULTIVITAMIN WITH MINERALS) TABS tablet Take 1 tablet by mouth daily.  . nitroGLYCERIN (NITROSTAT) 0.4 MG SL tablet Place 1 tablet (0.4 mg total) under the tongue every 5 (five) minutes x 3 doses as needed for chest pain.  . potassium chloride (KLOR-CON) 10 MEQ tablet Take 10 mEq by mouth every other day. Per patient taking 2 tablets of 10 mEq to equal 20 mEq.  [name] thyroid (ARMOUR) 15 MG tablet Take 15 mg by mouth daily before breakfast.   . torsemide (DEMADEX) 20 MG tablet Take 1 tablet (20 mg total) by mouth every other day.     Allergies: Allergies  Allergen Reactions  . Flagyl [Metronidazole] Itching, Rash and Other (See Comments)    Welts, also  . Penicillins Hives, Itching and Rash    Has patient had a PCN reaction causing immediate rash, facial/tongue/throat swelling, SOB or lightheadedness with hypotension: Yes  Has patient had a PCN reaction causing severe rash involving mucus membranes or skin necrosis: Yes Has patient had a PCN reaction that required hospitalization No Has patient had a PCN reaction occurring within the last 10 years: NO If all of the above answers are "NO", then may proceed with Cephalosporin use.   . Sulfamethoxazole Rash  . Meperidine Nausea And Vomiting  . Morphine And Related Nausea And Vomiting  . Tape Other (See Comments)    "THE PLASTIC, CLEAR TAPE CAN/DOES PULL OFF MY SKIN."  . Doxycycline Nausea Only, Rash and Other (See Comments)    Made her "feel terrible"  . Lanolin  Itching and Rash  . Sulfa Antibiotics Rash    Social History: The patient  reports that she has never smoked. She has never used smokeless tobacco. She reports that she does not drink alcohol and does not use drugs.   Family History: The patient's family history includes Coronary artery disease in her mother; Heart attack in her maternal grandfather and paternal grandfather; Multiple myeloma in her father; Thyroid disease in her mother.   Review of Systems: Please see the history of present illness.   All other systems are reviewed and negative.   Physical Exam: VS:  BP 126/72  Pulse 68   Ht '5\' 2"'  (1.575 m)   Wt 247 lb 9.6 oz (112.3 kg)   LMP 02/14/1995   SpO2 98%   BMI 45.29 kg/m  .  BMI Body mass index is 45.29 kg/m.  Wt Readings from Last 3 Encounters:  05/15/20 247 lb 9.6 oz (112.3 kg)  05/04/20 250 lb 3.2 oz (113.5 kg)  03/28/20 261 lb 3.2 oz (118.5 kg)    General: Alert and in no acute distress. She is morbidly obese but her weight is dropping.  Cardiac: Regular rate and rhythm. Heart tones are distant. Massive lymphedema but she notes it is down. This is chronic lymphedema.  Respiratory:  Lungs are clear to auscultation bilaterally with normal work of breathing.  GI: Soft and nontender.  MS: No deformity or atrophy. Gait and ROM intact.  Skin: Warm and dry. Color is normal.  Neuro:  Strength and sensation are intact and no gross focal deficits noted.  Psych: Alert, appropriate and with normal affect.   LABORATORY DATA:  EKG:  EKG is ordered today.  Personally reviewed by me. This demonstrates A sensing with V pacing - one PVC noted.  Lab Results  Component Value Date   WBC 6.9 05/04/2020   HGB 8.6 (L) 05/04/2020   HCT 27.2 (L) 05/04/2020   PLT 167 05/04/2020   GLUCOSE 115 (H) 05/04/2020   CHOL 92 (L) 10/06/2019   TRIG 92 10/06/2019   HDL 39 (L) 10/06/2019   LDLCALC 35 10/06/2019   ALT 18 05/02/2020   AST 21 05/02/2020   NA 139 05/04/2020   K 4.0  05/04/2020   CL 105 05/04/2020   CREATININE 1.52 (H) 05/04/2020   BUN 19 05/04/2020   CO2 27 05/04/2020   TSH 3.260 10/06/2019   INR 1.23 10/04/2015   HGBA1C 6.2 (H) 10/03/2015     BNP (last 3 results) Recent Labs    09/21/19 1930  BNP 709.1*    ProBNP (last 3 results) Recent Labs    04/04/20 1507  PROBNP 1,457*     Other Studies Reviewed Today:  ECHO IMPRESSIONS 04/2020  1. Left ventricular ejection fraction, by estimation, is 45 to 50%. The  left ventricle has mildly decreased function. The left ventricle  demonstrates regional wall motion abnormalities (see scoring  diagram/findings for description). Left ventricular  diastolic parameters are consistent with Grade II diastolic dysfunction  (pseudonormalization). There is mild hypokinesis of the left ventricular,  mid-apical inferolateral wall.  2. Right ventricular systolic function is normal. The right ventricular  size is normal. There is normal pulmonary artery systolic pressure.  3. Left atrial size was moderately dilated.  4. The mitral valve is normal in structure. Trivial mitral valve  regurgitation. No evidence of mitral stenosis.  5. The aortic valve is tricuspid. There is mild calcification of the  aortic valve. There is mild thickening of the aortic valve. Aortic valve  regurgitation is not visualized. Mild aortic valve sclerosis is present,  with no evidence of aortic valve  stenosis.  6. The inferior vena cava is normal in size with <50% respiratory  variability, suggesting right atrial pressure of 8 mmHg.     Assessment & Plan   1. General feeling of lightheadedness - ?dehydrated - ?related to medicines - this is now better - will recheck some baseline labs. For now, no change in her medicines but may need reduction of her Torsemide.   2. Recent PMVT arrest - on higher doses of amiodarone now. Remains on Coreg  as well. Remote check on her device yesterday.   3. Chronic systolic HF -  weight is down some. Lab today.   4. Underlying ICD  5. HTN - BP is fine here today - she is going to bring her cuff in for [place] to check for her at next visit.   6. CKD - recheck lab today.   7. CML - she is followed thru [facility]. She is on Gleevac  8. Chronic anemia - recheck lab today.    Current medicines are reviewed with the patient today.  The patient does not have concerns regarding medicines other than what has been noted above.  The following changes have been made:  See above.  Labs/ tests ordered today include:    Orders Placed This Encounter  Procedures  . Basic metabolic panel  . CBC  . EKG 12-Lead     Disposition:   FU with Dr. Harrington Challenger and Dr. Lovena Le as planned later this month.    Patient is agreeable to this plan and will call if any problems develop in the interim.   SignedTruitt Merle, NP  05/15/2020 3:51 PM  [facility] [address] [place], [place]  [address] Phone: [phone] Fax: [phone]

## 2020-05-15 NOTE — Patient Instructions (Addendum)
After Visit Summary:  We will be checking the following labs today - BMET and CBC   Medication Instructions:    Continue with your current medicines.    If you need a refill on your cardiac medications before your next appointment, please call your pharmacy.     Testing/Procedures To Be Arranged:  N/A  Follow-Up:  See Dr. Lovena Le and Harrington Challenger as planned later this month.    At [facility], you and your health needs are our priority.  As part of our continuing mission to provide you with exceptional heart care, we have created designated Provider Care Teams.  These Care Teams include your primary Cardiologist (physician) and Advanced Practice Providers (APPs -  Physician Assistants and Nurse Practitioners) who all work together to provide you with the care you need, when you need it.  Special Instructions:  . Stay safe, wash your hands for at least 20 seconds and wear a mask when needed.  . It was good to talk with you today.    Call the [place] office at [phone] if you have any questions, problems or concerns.

## 2020-05-16 LAB — BASIC METABOLIC PANEL
BUN/Creatinine Ratio: 18 (ref 12–28)
BUN: 23 mg/dL (ref 8–27)
CO2: 24 mmol/L (ref 20–29)
Calcium: 9.2 mg/dL (ref 8.7–10.3)
Chloride: 108 mmol/L — ABNORMAL HIGH (ref 96–106)
Creatinine, Ser: 1.31 mg/dL — ABNORMAL HIGH (ref 0.57–1.00)
GFR calc Af Amer: 45 mL/min/{1.73_m2} — ABNORMAL LOW (ref >59)
GFR calc non Af Amer: 39 mL/min/{1.73_m2} — ABNORMAL LOW (ref >59)
Glucose: 104 mg/dL — ABNORMAL HIGH (ref 65–99)
Potassium: 4.2 mmol/L (ref 3.5–5.2)
Sodium: 145 mmol/L — ABNORMAL HIGH (ref 134–144)

## 2020-05-16 LAB — CBC
Hematocrit: 30 % — ABNORMAL LOW (ref 34.0–46.6)
Hemoglobin: 9.9 g/dL — ABNORMAL LOW (ref 11.1–15.9)
MCH: 31.9 pg (ref 26.6–33.0)
MCHC: 33 g/dL (ref 31.5–35.7)
MCV: 97 fL (ref 79–97)
Platelets: 244 10*3/uL (ref 150–450)
RBC: 3.1 x10E6/uL — ABNORMAL LOW (ref 3.77–5.28)
RDW: 12.2 % (ref 11.7–15.4)
WBC: 7.8 10*3/uL (ref 3.4–10.8)

## 2020-05-23 ENCOUNTER — Other Ambulatory Visit: Payer: Self-pay | Admitting: Student

## 2020-05-23 NOTE — Progress Notes (Signed)
Opened in error

## 2020-05-28 ENCOUNTER — Encounter: Payer: Self-pay | Admitting: Internal Medicine

## 2020-05-28 ENCOUNTER — Ambulatory Visit (INDEPENDENT_AMBULATORY_CARE_PROVIDER_SITE_OTHER): Payer: Medicare Other | Admitting: Internal Medicine

## 2020-05-28 ENCOUNTER — Other Ambulatory Visit: Payer: Self-pay

## 2020-05-28 VITALS — BP 142/64 | HR 61 | Ht 62.0 in | Wt 251.6 lb

## 2020-05-28 DIAGNOSIS — I472 Ventricular tachycardia, unspecified: Secondary | ICD-10-CM

## 2020-05-28 DIAGNOSIS — I255 Ischemic cardiomyopathy: Secondary | ICD-10-CM

## 2020-05-28 DIAGNOSIS — Z95810 Presence of automatic (implantable) cardiac defibrillator: Secondary | ICD-10-CM | POA: Diagnosis not present

## 2020-05-28 DIAGNOSIS — I5022 Chronic systolic (congestive) heart failure: Secondary | ICD-10-CM

## 2020-05-28 LAB — CUP PACEART INCLINIC DEVICE CHECK
Battery Remaining Longevity: 34 mo
Battery Voltage: 2.96 V
Brady Statistic AP VP Percent: 0.81 %
Brady Statistic AP VS Percent: 0.07 %
Brady Statistic AS VP Percent: 97.43 %
Brady Statistic AS VS Percent: 1.69 %
Brady Statistic RA Percent Paced: 0.88 %
Brady Statistic RV Percent Paced: 0.07 %
Date Time Interrogation Session: 20211019103930
HighPow Impedance: 61 Ohm
Implantable Lead Implant Date: 20170612
Implantable Lead Implant Date: 20170612
Implantable Lead Implant Date: 20170612
Implantable Lead Location: 753858
Implantable Lead Location: 753859
Implantable Lead Location: 753860
Implantable Lead Model: 4398
Implantable Lead Model: 5076
Implantable Pulse Generator Implant Date: 20170612
Lead Channel Impedance Value: 1026 Ohm
Lead Channel Impedance Value: 160.941
Lead Channel Impedance Value: 200.511
Lead Channel Impedance Value: 216.367
Lead Channel Impedance Value: 217.143
Lead Channel Impedance Value: 235.862
Lead Channel Impedance Value: 247 Ohm
Lead Channel Impedance Value: 304 Ohm
Lead Channel Impedance Value: 342 Ohm
Lead Channel Impedance Value: 342 Ohm
Lead Channel Impedance Value: 532 Ohm
Lead Channel Impedance Value: 589 Ohm
Lead Channel Impedance Value: 608 Ohm
Lead Channel Impedance Value: 760 Ohm
Lead Channel Impedance Value: 779 Ohm
Lead Channel Impedance Value: 836 Ohm
Lead Channel Impedance Value: 893 Ohm
Lead Channel Impedance Value: 931 Ohm
Lead Channel Pacing Threshold Amplitude: 1 V
Lead Channel Pacing Threshold Amplitude: 1.25 V
Lead Channel Pacing Threshold Amplitude: 1.25 V
Lead Channel Pacing Threshold Pulse Width: 0.4 ms
Lead Channel Pacing Threshold Pulse Width: 0.4 ms
Lead Channel Pacing Threshold Pulse Width: 0.8 ms
Lead Channel Sensing Intrinsic Amplitude: 4 mV
Lead Channel Sensing Intrinsic Amplitude: 4.8 mV
Lead Channel Setting Pacing Amplitude: 2 V
Lead Channel Setting Pacing Amplitude: 2.25 V
Lead Channel Setting Pacing Amplitude: 2.5 V
Lead Channel Setting Pacing Pulse Width: 0.4 ms
Lead Channel Setting Pacing Pulse Width: 0.8 ms
Lead Channel Setting Sensing Sensitivity: 0.3 mV

## 2020-05-28 MED ORDER — AMIODARONE HCL 200 MG PO TABS: ORAL_TABLET | ORAL | 3 refills | Status: DC

## 2020-05-28 NOTE — Patient Instructions (Addendum)
Medication Instructions:  Your physician has recommended you make the following change in your medication:   1.  ON July 04, 2020 REDUCE your amiodarone 200 mg to ONE tablet by mouth daily.  Labwork: None ordered.  Testing/Procedures: None ordered.  Follow-Up:  Michalene, Dr. Harrington Challenger' nurse-will call you to reschedule your appointment.  Your physician wants you to follow-up in: 6 months with Dr. Lovena Le.   You will receive a reminder letter in the mail two months in advance. If you don't receive a letter, please call our office to schedule the follow-up appointment.  Remote monitoring is used to monitor your ICD from home. This monitoring reduces the number of office visits required to check your device to one time per year. It allows [place] to keep an eye on the functioning of your device to ensure it is working properly. You are scheduled for a device check from home on 07/31/2020. You may send your transmission at any time that day. If you have a wireless device, the transmission will be sent automatically. After your physician reviews your transmission, you will receive a postcard with your next transmission date.  Any Other Special Instructions Will Be Listed Below (If Applicable).  If you need a refill on your cardiac medications before your next appointment, please call your pharmacy.

## 2020-05-28 NOTE — Progress Notes (Signed)
HPI Ms. Latterell returns today for followup. She is a pleasant morbidly obese 79 yo woman with chronic systolic heart failure, PAF, and VT. She is s/p Biv ICD insertion. She was in the hospital several weeks ago with recurrent VT and ICD shocks. She has felt well in the interim. We increased her dose of amiodarone to 200 bid. She has felt better in the past few weeks. She is fairly sedentary. No additional syncope or ICD therapies. Allergies  Allergen Reactions  . Flagyl [Metronidazole] Itching, Rash and Other (See Comments)    Welts, also  . Penicillins Hives, Itching and Rash    Has patient had a PCN reaction causing immediate rash, facial/tongue/throat swelling, SOB or lightheadedness with hypotension: Yes  Has patient had a PCN reaction causing severe rash involving mucus membranes or skin necrosis: Yes Has patient had a PCN reaction that required hospitalization No Has patient had a PCN reaction occurring within the last 10 years: NO If all of the above answers are "NO", then may proceed with Cephalosporin use.   . Sulfamethoxazole Rash  . Meperidine Nausea And Vomiting  . Morphine And Related Nausea And Vomiting  . Tape Other (See Comments)    "THE PLASTIC, CLEAR TAPE CAN/DOES PULL OFF MY SKIN."  . Doxycycline Nausea Only, Rash and Other (See Comments)    Made her "feel terrible"  . Lanolin Itching and Rash  . Sulfa Antibiotics Rash     Current Outpatient Medications  Medication Sig Dispense Refill  . acetaminophen (TYLENOL) 500 MG tablet Take 500-1,000 mg by mouth every 6 (six) hours as needed for moderate pain.    [name] allopurinol (ZYLOPRIM) 100 MG tablet Take 100 mg by mouth daily.     Derrill Memo ON 07/04/2020] amiodarone (PACERONE) 200 MG tablet Take 1 tablet (200 mg total) by mouth 2 (two) times daily for 37 days, THEN 1 tablet (200 mg total) daily. 164 tablet 3  . aspirin EC 81 MG tablet Take 81 mg by mouth at bedtime.     [name] atorvastatin (LIPITOR) 20 MG tablet Take 1  tablet (20 mg total) by mouth daily. 90 tablet 3  . carvedilol (COREG) 25 MG tablet Take 1 tablet (25 mg total) by mouth 2 (two) times daily. 180 tablet 3  . Cholecalciferol (VITAMIN D-3 PO) Take 5,000 Units by mouth daily.    [name] imatinib (GLEEVEC) 400 MG tablet Take 400 mg by mouth daily with supper.     . Inulin (METAMUCIL CLEAR & NATURAL PO) Take by mouth See admin instructions. Mix 1 teaspoonful into 8 ounces of water and drink once a day    . Magnesium Oxide 400 MG CAPS Take 1 capsule (400 mg total) by mouth 2 (two) times daily. 180 capsule 1  . Multiple Vitamin (MULTIVITAMIN WITH MINERALS) TABS tablet Take 1 tablet by mouth daily.    . nitroGLYCERIN (NITROSTAT) 0.4 MG SL tablet Place 1 tablet (0.4 mg total) under the tongue every 5 (five) minutes x 3 doses as needed for chest pain. 25 tablet 3  . potassium chloride (KLOR-CON) 10 MEQ tablet Take 10 mEq by mouth every other day. Per patient taking 2 tablets of 10 mEq to equal 20 mEq.    [name] thyroid (ARMOUR) 15 MG tablet Take 15 mg by mouth daily before breakfast.     . torsemide (DEMADEX) 20 MG tablet Take 1 tablet (20 mg total) by mouth every other day. 45 tablet 3   No current facility-administered medications  for this visit.     Past Medical History:  Diagnosis Date  . CAD in native artery 2017   stent to LAD  . Cardiac arrest ([place]) 09/2015  . CHF (congestive heart failure) ([place])   . CML (chronic myelocytic leukemia) ([place])   . Complication of anesthesia   . Hx: UTI (urinary tract infection)   . Hypertension   . Melanoma ([place])   . PONV (postoperative nausea and vomiting)   . PVC (premature ventricular contraction)    HISTORY OF  . Thyroid disease   . V tach ([place])     ROS:   All systems reviewed and negative except as noted in the HPI.   Past Surgical History:  Procedure Laterality Date  . ABDOMINAL HYSTERECTOMY    . APPENDECTOMY    . CARDIAC CATHETERIZATION N/A 10/03/2015   Procedure: Left Heart Cath and Coronary  Angiography;  Surgeon: [name], MD;  Location: [facility];  Service: Cardiovascular;  Laterality: N/A;  . CARDIAC CATHETERIZATION N/A 10/07/2015   Procedure: Coronary Stent Intervention;  Surgeon: [name], MD;  Location: [facility];  Service: Cardiovascular;  Laterality: N/A;  . CARDIAC CATHETERIZATION  09/22/2019  . EP IMPLANTABLE DEVICE N/A 01/20/2016   Procedure: BiV ICD Insertion CRT-D;  Surgeon: [name], MD;  Location: [facility];  Service: Cardiovascular;  Laterality: N/A;  . LEFT HEART CATH AND CORONARY ANGIOGRAPHY N/A 09/22/2019   Procedure: LEFT HEART CATH AND CORONARY ANGIOGRAPHY;  Surgeon: Martinique, Peter M, MD;  Location: [facility];  Service: Cardiovascular;  Laterality: N/A;  . TONSILLECTOMY    . TONSILLECTOMY    . TUBAL LIGATION       Family History  Problem Relation Age of Onset  . Coronary artery disease Mother   . Thyroid disease Mother   . Multiple myeloma Father   . Heart attack Maternal Grandfather   . Heart attack Paternal Grandfather      Social History   Socioeconomic History  . Marital status: Divorced    Spouse name: Not on file  . Number of children: Not on file  . Years of education: Not on file  . Highest education level: Not on file  Occupational History  . Not on file  Tobacco Use  . Smoking status: Never Smoker  . Smokeless tobacco: Never Used  Vaping Use  . Vaping Use: Never used  Substance and Sexual Activity  . Alcohol use: No    Alcohol/week: 0.0 standard drinks  . Drug use: No  . Sexual activity: Not on file  Other Topics Concern  . Not on file  Social History Narrative  . Not on file   Social Determinants of Health   Financial Resource Strain:   . Difficulty of Paying Living Expenses: Not on file  Food Insecurity:   . Worried About Charity fundraiser in the Last Year: Not on file  . Ran Out of Food in the Last Year: Not on file  Transportation Needs:   . Lack of  Transportation (Medical): Not on file  . Lack of Transportation (Non-Medical): Not on file  Physical Activity:   . Days of Exercise per Week: Not on file  . Minutes of Exercise per Session: Not on file  Stress:   . Feeling of Stress : Not on file  Social Connections:   . Frequency of Communication with Friends and Family: Not on file  . Frequency of Social Gatherings with Friends  and Family: Not on file  . Attends Religious Services: Not on file  . Active Member of Clubs or Organizations: Not on file  . Attends Archivist Meetings: Not on file  . Marital Status: Not on file  Intimate Partner Violence:   . Fear of Current or Ex-Partner: Not on file  . Emotionally Abused: Not on file  . Physically Abused: Not on file  . Sexually Abused: Not on file     BP (!) 142/64   Pulse 61   Ht _0  (1.575 m)   Wt 251 lb 9.6 oz (114.1 kg)   LMP 02/14/1995   SpO2 99%   BMI 46.02 kg/m   Physical Exam:  obese appearing 79 yo woman, NAD HEENT: Unremarkable Neck:  6 cm JVD, no thyromegally Lymphatics:  No adenopathy Back:  No CVA tenderness Lungs:  Clear with no wheezes HEART:  Regular rate rhythm, no murmurs, no rubs, no clicks Abd:  soft, positive bowel sounds, no organomegally, no rebound, no guarding Ext:  2 plus pulses, no edema, no cyanosis, no clubbing Skin:  No rashes no nodules Neuro:  CN II through XII intact, motor grossly intact  EKG - NSR with biv pacing  DEVICE  Normal device function.  See PaceArt for details.   Assess/Plan: 1. VT - I have discussed the treatment options with the patient. I have recommended she continue amio 200 bid for another month and then decrease to 200 mg daily the end of November.  2. Chronic systolic heart failure - her symptoms remain class 2. She will continue her current meds. 3. Morbid obesity - she is encouraged to lose weight. 4. ICD -her Medtronic Biv ICD is working normally. We will recheck in several months.  Carleene Overlie  Anaisa Radi,MD

## 2020-05-31 ENCOUNTER — Telehealth: Payer: Self-pay

## 2020-05-31 NOTE — Telephone Encounter (Signed)
Referred to ICM clinic by Oda Kilts, PA.   Spoke with patient and agreeable to monthly follow up.  Advised monitor should be by bedside in order for it to automatically transmit a report during sleep hours of 12 midnight and 6 AM.  Patient confirmed monitor is at bedside. Advised will receive a call after the transmission is reviewed to provide results.  Provided ICM number and explained should call if experiencing any fluid symptoms such as weight gain, shortness of breath or extremity/abdominal swelling. 1st ICM remote transmission scheduled for 06/17/2020.

## 2020-05-31 NOTE — Telephone Encounter (Signed)
-----   Message from Shirley Friar, [place] sent at 05/01/2020  3:04 PM EDT ----- This lady has had some issues with VT when she is volume overloaded.   She is interested in the [facility] clinic. Would you please give her a call for enrollment next week?  Thank you!Legrand Como [address]" Zihlman, PA-C  05/01/2020 3:04 PM

## 2020-06-03 ENCOUNTER — Ambulatory Visit: Payer: Medicare Other | Admitting: Internal Medicine

## 2020-06-17 ENCOUNTER — Ambulatory Visit (INDEPENDENT_AMBULATORY_CARE_PROVIDER_SITE_OTHER): Payer: Medicare Other

## 2020-06-17 DIAGNOSIS — I5022 Chronic systolic (congestive) heart failure: Secondary | ICD-10-CM | POA: Diagnosis not present

## 2020-06-17 DIAGNOSIS — Z95810 Presence of automatic (implantable) cardiac defibrillator: Secondary | ICD-10-CM | POA: Diagnosis not present

## 2020-06-19 ENCOUNTER — Telehealth: Payer: Self-pay

## 2020-06-19 NOTE — Progress Notes (Signed)
EPIC Encounter for ICM Monitoring  Patient Name: Lisa [name] is a 79 y.o. female Date: 06/19/2020 Primary Care Physican: [name], MD Primary Cardiologist: Harrington Challenger Electrophysiologist: Santina Evans Pacing: 97.2%  05/28/2020 Office Weight: 251 lbs        1st ICM Remote Transmission.  Attempted call to patient and unable to reach.  Left detailed message per DPR regarding transmission. Transmission reviewed.    Optivol thoracic impedance trending slightly below baseline normal.   Prescribed:   Torsemide 20 mg take 1 tablet every other day  Potassium 10 mEq take 1 tablet every other day  Recommendations: Left voice mail with ICM number and encouraged to call if experiencing any fluid symptoms.  Follow-up plan: ICM clinic phone appointment on 07/22/2020.   91 day device clinic remote transmission 07/31/2020.    EP/Cardiology Office Visits: 08/26/2020 with Dr. Harrington Challenger.    Copy of ICM check sent to Dr. Lovena Le.   3 month ICM trend: 06/18/2020    1 Year ICM trend:       [name], RN 06/19/2020 10:26 AM

## 2020-06-19 NOTE — Telephone Encounter (Signed)
Remote ICM transmission received.  Attempted call to patient regarding ICM remote transmission and left detailed message per DPR.  Advised to return call for any fluid symptoms or questions. Next ICM remote transmission scheduled 07/22/2020.

## 2020-07-22 ENCOUNTER — Ambulatory Visit (INDEPENDENT_AMBULATORY_CARE_PROVIDER_SITE_OTHER): Payer: Medicare Other

## 2020-07-22 ENCOUNTER — Telehealth: Payer: Self-pay

## 2020-07-22 DIAGNOSIS — I5022 Chronic systolic (congestive) heart failure: Secondary | ICD-10-CM

## 2020-07-22 DIAGNOSIS — Z95810 Presence of automatic (implantable) cardiac defibrillator: Secondary | ICD-10-CM

## 2020-07-22 NOTE — Telephone Encounter (Signed)
Remote ICM transmission received.  Attempted call to patient regarding ICM remote transmission and call was answered but no response.

## 2020-07-22 NOTE — Progress Notes (Signed)
EPIC Encounter for ICM Monitoring  Patient Name: Lisa [name] is a 79 y.o. female Date: 07/22/2020 Primary Care Physican: [name], MD Primary Cardiologist: Harrington Challenger Electrophysiologist: Santina Evans Pacing: 98.1%         05/28/2020 Office Weight: 251 lbs                                                            Attempted call to patient and unable to reach.  Transmission reviewed.    Optivol thoracic impedance suggesting possible fluid accumulation since 06/27/2020 with exception of a couple of days at baseline 2 weeks ago.   Prescribed:   Torsemide 20 mg take 1 tablet every other day  Potassium 10 mEq take 1 tablet every other day  Labs: 05/15/2020 Creatinine 1.31, BUN 23, Potassium 4.2, Sodium 145, GFR 39-45 05/04/2020 Creatinine 1.52, BUN 19, Potassium 4.0, Sodium 139, GFR 32-38  05/03/2020 Creatinine 1.36, BUN 15, Potassium 4.6, Sodium 139, GFR 37-43  05/02/2020 Creatinine 1.21, BUN 13, Potassium 4.5, Sodium 141, GFR 43-50  A complete set of results can be found in Results Review.  Recommendations:   Unable to reach.    Follow-up plan: ICM clinic phone appointment on 07/26/2020 (manual) to recheck fluid levels.   91 day device clinic remote transmission 07/31/2020.    EP/Cardiology Office Visits: 08/26/2020 with Dr. Harrington Challenger.    Copy of ICM check sent to Dr. Lovena Le and Dr Harrington Challenger.    3 month ICM trend: 07/22/2020    1 Year ICM trend:       Lisa Billings, RN 07/22/2020 1:50 PM

## 2020-07-24 ENCOUNTER — Telehealth: Payer: Self-pay | Admitting: *Deleted

## 2020-07-24 DIAGNOSIS — I5022 Chronic systolic (congestive) heart failure: Secondary | ICD-10-CM

## 2020-07-24 NOTE — Telephone Encounter (Signed)
Received message from Dr. Harrington Challenger to have patient come in for labs in 2 weeks.  Per ICM nurse report of Optivol thoracic impenance that suggests increased fluid accumulation.   I called her. She has been scheduled for lab appointment for 08/08/20. Adv to call sooner if noticing increase in swelling or any SOB.

## 2020-07-24 NOTE — Progress Notes (Signed)
CHeck BMET and BNP in 2  wks

## 2020-07-24 NOTE — Telephone Encounter (Signed)
-----   Message from Fay Records, MD sent at 07/24/2020 12:55 PM EST -----   ----- Message ----- From: [name], RN Sent: 07/22/2020   4:27 PM EST To: Fay Records, MD, [name], MD

## 2020-07-26 ENCOUNTER — Ambulatory Visit (INDEPENDENT_AMBULATORY_CARE_PROVIDER_SITE_OTHER): Payer: Medicare Other

## 2020-07-26 DIAGNOSIS — Z95810 Presence of automatic (implantable) cardiac defibrillator: Secondary | ICD-10-CM

## 2020-07-26 DIAGNOSIS — I5022 Chronic systolic (congestive) heart failure: Secondary | ICD-10-CM

## 2020-07-26 NOTE — Progress Notes (Signed)
EPIC Encounter for ICM Monitoring  Patient Name: Lisa [name] is a 79 y.o. female Date: 07/26/2020 Primary Care Physican: [name], MD Primary Cardiologist:Ross Electrophysiologist:Taylor Bi-V Pacing:97.9% 10/19/2021OfficeWeight:251lbs    Transmission reviewed.   Optivol thoracic impedancesuggesting fluid levels returned to normal  Prescribed:  Torsemide20 mg take 1 tablet every other day  Potassium 10 mEq take 1 tablet every other day  Labs: 05/15/2020 Creatinine 1.31, BUN 23, Potassium 4.2, Sodium 145, GFR 39-45 05/04/2020 Creatinine 1.52, BUN 19, Potassium 4.0, Sodium 139, GFR 32-38  05/03/2020 Creatinine 1.36, BUN 15, Potassium 4.6, Sodium 139, GFR 37-43  05/02/2020 Creatinine 1.21, BUN 13, Potassium 4.5, Sodium 141, GFR 43-50  A complete set of results can be found in Results Review.  Recommendations:   No changes.    Follow-up plan: ICM clinic phone appointment on1/17/2022. 91 day device clinic remote transmission 07/31/2020.   EP/Cardiology Office Visits:1/17/2022with Dr. Harrington Challenger.   Copy of ICM check sent to Dr.Taylor  3 month ICM trend: 07/26/2020    1 Year ICM trend:       [name], RN 07/26/2020 4:07 PM

## 2020-07-31 ENCOUNTER — Ambulatory Visit (INDEPENDENT_AMBULATORY_CARE_PROVIDER_SITE_OTHER): Payer: Medicare Other

## 2020-07-31 DIAGNOSIS — I255 Ischemic cardiomyopathy: Secondary | ICD-10-CM | POA: Diagnosis not present

## 2020-08-01 LAB — CUP PACEART REMOTE DEVICE CHECK
Battery Remaining Longevity: 36 mo
Battery Voltage: 2.96 V
Brady Statistic AP VP Percent: 0.13 %
Brady Statistic AP VS Percent: 0.02 %
Brady Statistic AS VP Percent: 98.24 %
Brady Statistic AS VS Percent: 1.6 %
Brady Statistic RA Percent Paced: 0.16 %
Brady Statistic RV Percent Paced: 0.06 %
Date Time Interrogation Session: 20211222033327
HighPow Impedance: 49 Ohm
Implantable Lead Implant Date: 20170612
Implantable Lead Implant Date: 20170612
Implantable Lead Implant Date: 20170612
Implantable Lead Location: 753858
Implantable Lead Location: 753859
Implantable Lead Location: 753860
Implantable Lead Model: 4398
Implantable Lead Model: 5076
Implantable Pulse Generator Implant Date: 20170612
Lead Channel Impedance Value: 147.097
Lead Channel Impedance Value: 178.125
Lead Channel Impedance Value: 185.366
Lead Channel Impedance Value: 192.065
Lead Channel Impedance Value: 200.511
Lead Channel Impedance Value: 228 Ohm
Lead Channel Impedance Value: 285 Ohm
Lead Channel Impedance Value: 285 Ohm
Lead Channel Impedance Value: 304 Ohm
Lead Channel Impedance Value: 418 Ohm
Lead Channel Impedance Value: 475 Ohm
Lead Channel Impedance Value: 513 Ohm
Lead Channel Impedance Value: 589 Ohm
Lead Channel Impedance Value: 646 Ohm
Lead Channel Impedance Value: 703 Ohm
Lead Channel Impedance Value: 722 Ohm
Lead Channel Impedance Value: 722 Ohm
Lead Channel Impedance Value: 817 Ohm
Lead Channel Pacing Threshold Amplitude: 0.75 V
Lead Channel Pacing Threshold Amplitude: 1 V
Lead Channel Pacing Threshold Amplitude: 1.625 V
Lead Channel Pacing Threshold Pulse Width: 0.4 ms
Lead Channel Pacing Threshold Pulse Width: 0.4 ms
Lead Channel Pacing Threshold Pulse Width: 0.8 ms
Lead Channel Sensing Intrinsic Amplitude: 2.875 mV
Lead Channel Sensing Intrinsic Amplitude: 2.875 mV
Lead Channel Sensing Intrinsic Amplitude: 3.875 mV
Lead Channel Sensing Intrinsic Amplitude: 3.875 mV
Lead Channel Setting Pacing Amplitude: 2.25 V
Lead Channel Setting Pacing Amplitude: 2.25 V
Lead Channel Setting Pacing Amplitude: 2.5 V
Lead Channel Setting Pacing Pulse Width: 0.4 ms
Lead Channel Setting Pacing Pulse Width: 0.8 ms
Lead Channel Setting Sensing Sensitivity: 0.3 mV

## 2020-08-12 ENCOUNTER — Other Ambulatory Visit: Payer: Medicare Other

## 2020-08-13 ENCOUNTER — Other Ambulatory Visit: Payer: Medicare Other | Admitting: *Deleted

## 2020-08-13 ENCOUNTER — Other Ambulatory Visit: Payer: Self-pay

## 2020-08-13 DIAGNOSIS — I5022 Chronic systolic (congestive) heart failure: Secondary | ICD-10-CM

## 2020-08-14 LAB — BASIC METABOLIC PANEL
BUN/Creatinine Ratio: 16 (ref 12–28)
BUN: 24 mg/dL (ref 8–27)
CO2: 25 mmol/L (ref 20–29)
Calcium: 8.9 mg/dL (ref 8.7–10.3)
Chloride: 105 mmol/L (ref 96–106)
Creatinine, Ser: 1.54 mg/dL — ABNORMAL HIGH (ref 0.57–1.00)
GFR calc Af Amer: 37 mL/min/{1.73_m2} — ABNORMAL LOW (ref >59)
GFR calc non Af Amer: 32 mL/min/{1.73_m2} — ABNORMAL LOW (ref >59)
Glucose: 102 mg/dL — ABNORMAL HIGH (ref 65–99)
Potassium: 4.8 mmol/L (ref 3.5–5.2)
Sodium: 142 mmol/L (ref 134–144)

## 2020-08-14 LAB — PRO B NATRIURETIC PEPTIDE: NT-Pro BNP: 1245 pg/mL — ABNORMAL HIGH (ref 0–738)

## 2020-08-14 NOTE — Progress Notes (Signed)
Remote ICD transmission.   

## 2020-08-21 ENCOUNTER — Telehealth: Payer: Self-pay | Admitting: *Deleted

## 2020-08-21 MED ORDER — TORSEMIDE 20 MG PO TABS: 20.0000 mg | ORAL_TABLET | ORAL | 3 refills | Status: DC

## 2020-08-21 NOTE — Telephone Encounter (Signed)
-----   Message from Fay Records, MD sent at 08/15/2020  1:23 PM EST ----- Electrolytes are OK  Kidney funciton is stable at 1.54 Fluid is up some   She is on torsemide every other day She could try taking 2 days in a row x1 then skip a day   Do that then skip a day   Confirms taking 4x per wk I am reluctant to go to daily because I think Cr will bump Watch/limit salt   Fluids  1.5 L max

## 2020-08-21 NOTE — Telephone Encounter (Signed)
Spoke with patient.  Reviewed results.  She confirmed she is currently taking torsemide every other day. She will increase to one tablet daily for 2 days, skip a day, repeat.  She is limiting fluids and sodium.  Reports that since her potassium is 4.8, some days she is only take 10 meq instead of 20 meq because when potassium is too high or too low she gets cramping.  Has follow up scheduled with Dr. Harrington Challenger 08/26/20.  Relies on Melburn Popper and may have to reschedule if weather interferes.

## 2020-08-23 ENCOUNTER — Encounter: Payer: Self-pay | Admitting: Internal Medicine

## 2020-08-23 ENCOUNTER — Ambulatory Visit (INDEPENDENT_AMBULATORY_CARE_PROVIDER_SITE_OTHER): Payer: Medicare Other | Admitting: Internal Medicine

## 2020-08-23 ENCOUNTER — Other Ambulatory Visit: Payer: Self-pay

## 2020-08-23 VITALS — BP 112/60 | HR 65 | Ht 61.5 in | Wt 257.6 lb

## 2020-08-23 DIAGNOSIS — I5043 Acute on chronic combined systolic (congestive) and diastolic (congestive) heart failure: Secondary | ICD-10-CM

## 2020-08-23 DIAGNOSIS — I472 Ventricular tachycardia, unspecified: Secondary | ICD-10-CM

## 2020-08-23 NOTE — Patient Instructions (Signed)
Medication Instructions:  No changes *If you need a refill on your cardiac medications before your next appointment, please call your pharmacy*   Lab Work: Take lab slip with you to PCP.  Lab results to be faxed to Dr. Harrington Challenger at [phone]   Testing/Procedures: none   Follow-Up: At [facility], you and your health needs are our priority.  As part of our continuing mission to provide you with exceptional heart care, we have created designated Provider Care Teams.  These Care Teams include your primary Cardiologist (physician) and Advanced Practice Providers (APPs -  Physician Assistants and Nurse Practitioners) who all work together to provide you with the care you need, when you need it.  Your next appointment:   3 month(s)  The format for your next appointment:   In Person  Provider:   You may see [name], MD or one of the following Advanced Practice Providers on your designated Care Team:    [name], PA-C  [name], [place]    Other Instructions

## 2020-08-23 NOTE — Progress Notes (Signed)
Cardiology Office Note   Date:  08/23/2020   ID:  Lisa [name], DOB May 27, 1941, MRN [id number]  PCP:  Lisa Neat, MD  Cardiologist:   Lisa Carnes, MD   F/U of CHF      History of Present Illness: Lisa [name] is a 80 y.o. 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  Cardiac catheterization was done.  This showed a 65% ramus narrowing.  LVEDP was moderately elevated at 23.[name]  LVEF was 50 to 55%.  She was seen by EP.  Placed on amiodarone.  I last saw her in clinic in Aug [name]   Since then she has called in  She was also seen by Lisa [name]   She was lightheaded at time   [facility] down    Recomm increased fluid intake She was also seen by Lisa [name]  Reocmm she stay on 200 bid until endo of November then cut back to qd   [facility] has alos been followed by Device clinic and Torsemide doses adjusted byased on thoracic impedence   Overall the patient says her breathing is stable She still get SOB with activity  She denies CP   She denies dizziness   Followed for lymphedema    Has appt in PT soon  WOuld like to go because she needs assistance with scarf to lift left leg   Then she will go to lymphedema clinic      Current Meds  Medication Sig  . acetaminophen (TYLENOL) 500 MG tablet Take 500-1,000 mg by mouth every 6 (six) hours as needed for moderate pain.  [name] acyclovir ointment (ZOVIRAX) 5 % Apply small amount to the affected area 6 times a day x 7 days prn  . allopurinol (ZYLOPRIM) 100  MG tablet Take 100 mg by mouth daily.   [name] amiodarone (PACERONE) 200 MG tablet Take 200 mg by mouth daily.  [name] aspirin EC 81 MG tablet Take 81 mg by mouth at bedtime.   [name] atorvastatin (LIPITOR) 20 MG tablet Take 1 tablet (20 mg total) by mouth daily.  . calcium carbonate (OS-CAL - DOSED IN MG OF ELEMENTAL CALCIUM) 1250 (500 Ca) MG tablet Take 1 tablet by mouth.  . carvedilol (COREG) 25 MG tablet Take 1 tablet (25 mg total) by mouth 2 (two) times daily.  . cephALEXin (KEFLEX) 500 MG capsule Take 500 mg by mouth 3 (three) times daily.  . Cholecalciferol (VITAMIN Lisa-3 PO) Take 5,000 Units by mouth daily.  [name] imatinib (GLEEVEC) 400 MG tablet Take 400 mg by mouth daily with supper.   . Inulin (METAMUCIL CLEAR & NATURAL PO) Take by mouth See admin instructions. Mix 1 teaspoonful into 8 ounces of water and drink as needed  . Magnesium Oxide 400 MG CAPS Take 1 capsule (400 mg total) by mouth 2 (two) times daily.  . Multiple Vitamin (MULTIVITAMIN WITH MINERALS) TABS tablet Take 1 tablet by mouth daily.  [name]  nitroGLYCERIN (NITROSTAT) 0.4 MG SL tablet Place 1 tablet (0.4 mg total) under the tongue every 5 (five) minutes x 3 doses as needed for chest pain.  . potassium chloride (KLOR-CON) 10 MEQ tablet Take 10 mEq by mouth every other day. Per patient taking 2 tablets of 10 mEq to equal 20 mEq.  [name] silver sulfADIAZINE (SILVADENE) 1 % cream Apply topically.  [name] thyroid (ARMOUR) 15 MG tablet Take 15 mg by mouth daily before breakfast.   . torsemide (DEMADEX) 20 MG tablet Take 1 tablet (20 mg total) by mouth as directed.  . triamcinolone ointment (KENALOG) 0.1 % Apply a small amount to the affected area bid     Allergies:   Flagyl [metronidazole], Penicillins, Sulfamethoxazole, Meperidine, Morphine and related, Tape, Doxycycline, Lanolin, and Sulfa antibiotics   Past Medical History:  Diagnosis Date  . CAD in native artery 2017   stent to LAD  . Cardiac arrest ([place]) 09/2015  . CHF (congestive heart failure)  ([place])   . CML (chronic myelocytic leukemia) ([place])   . Complication of anesthesia   . Hx: UTI (urinary tract infection)   . Hypertension   . Melanoma ([place])   . PONV (postoperative nausea and vomiting)   . PVC (premature ventricular contraction)    HISTORY OF  . Thyroid disease   . V tach [facility])     Past Surgical History:  Procedure Laterality Date  . ABDOMINAL HYSTERECTOMY    . APPENDECTOMY    . CARDIAC CATHETERIZATION N/A 10/03/2015   Procedure: Left Heart Cath and Coronary Angiography;  Surgeon: [name], MD;  Location: [place] CV LAB;  Service: Cardiovascular;  Laterality: N/A;  . CARDIAC CATHETERIZATION N/A 10/07/2015   Procedure: Coronary Stent Intervention;  Surgeon: [name], MD;  Location: [place] [facility];  Service: Cardiovascular;  Laterality: N/A;  . CARDIAC CATHETERIZATION  02/12/[name]  . EP IMPLANTABLE DEVICE N/A 01/20/2016   Procedure: BiV ICD Insertion CRT-Lisa;  Surgeon: [name], MD;  Location: [place] CV LAB;  Service: Cardiovascular;  Laterality: N/A;  . LEFT HEART CATH AND CORONARY ANGIOGRAPHY N/A 2/12/[name]   Procedure: LEFT HEART CATH AND CORONARY ANGIOGRAPHY;  Surgeon: Martinique, Peter M, MD;  Location: [place] CV LAB;  Service: Cardiovascular;  Laterality: N/A;  . TONSILLECTOMY    . TONSILLECTOMY    . TUBAL LIGATION       Social History:  The patient  reports that she has never smoked. She has never used smokeless tobacco. She reports that she does not drink alcohol and does not use drugs.   Family History:  The patient's family history includes Coronary artery disease in her mother; Heart attack in her maternal grandfather and paternal grandfather; Multiple myeloma in her father; Thyroid disease in her mother.    ROS:  Please see the history of present illness. All other systems are reviewed and  Negative to the above problem except as noted.    PHYSICAL EXAM: VS:  BP 112/60   Pulse 65   Ht 5' 1.5" (1.562 m)   Wt 257 lb  9.6 oz (116.8 kg)   LMP 02/14/1995   SpO2 99%   BMI 47.88 kg/m   GEN: Morbidly obese 80 yo in no acute distress  HEENT: normal  Neck: JVP does not appear elevated  \Cardiac: RRR; no murmurs,    Chronic lymphedema of legs  Respiratory:  clear to auscultation bilaterally, normal work of breathing GI: soft, nontender Skin: warm and dry, Chronic skin changes  in legs    EKG:  EKG is not ordered today.   Lipid Panel    Component Value Date/Time   CHOL 92 (Lisa) 02/26/[name] 1059   TRIG 92 02/26/[name] 1059   HDL 39 (Lisa) 02/26/[name] 1059   CHOLHDL 2.4 02/26/[name] 1059   CHOLHDL 4.2 10/04/2015 0329   VLDL 18 10/04/2015 0329   LDLCALC 35 02/26/[name] 1059      Wt Readings from Last 3 Encounters:  08/23/20 257 lb 9.6 oz (116.8 kg)  05/28/20 251 lb 9.6 oz (114.1 kg)  05/15/20 247 lb 9.6 oz (112.3 kg)      ASSESSMENT AND PLAN:  1  Hx chronic systoiic CHF   Pt now s/p CRT Lisa LVEF at cath ([name])  50 to 55%    Last echo in 2018 LVEF 55 to 60% Cr labile   Meds frequently adjusted  Based on this and impedence    2   CAD PT had cath in February after a VT event.  No obstructive CAD noted.Patient stent to LAD    Pt continues to deny CP  3  Hx VF arrest   S/p Medtronic CRT-Lisa.  Keep on amiodarone.    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  Has the device so can follow   .  5  HTN  BP is labile   Good today   6   Lipids   LDL 35  HDL 39   7.  CML.  Continues to follow with oncology    F/U later this spring     Lisa [name]  Current medicines are reviewed at length with the patient today.  The patient does not have concerns regarding medicines.  Signed, Lisa Carnes, MD  08/23/2020 3:26 PM    [address], [place], [place]  [address] Phone: [phone]; Fax: [phone]

## 2020-08-26 ENCOUNTER — Ambulatory Visit (INDEPENDENT_AMBULATORY_CARE_PROVIDER_SITE_OTHER): Payer: Medicare Other

## 2020-08-26 ENCOUNTER — Ambulatory Visit: Payer: Medicare Other | Admitting: Internal Medicine

## 2020-08-26 DIAGNOSIS — Z95810 Presence of automatic (implantable) cardiac defibrillator: Secondary | ICD-10-CM

## 2020-08-26 DIAGNOSIS — I5022 Chronic systolic (congestive) heart failure: Secondary | ICD-10-CM | POA: Diagnosis not present

## 2020-08-26 NOTE — Progress Notes (Signed)
EPIC Encounter for ICM Monitoring  Patient Name: Lisa [name] is a 80 y.o. female Date: 08/26/2020 Primary Care Physican: [name], MD Primary Cardiologist:Ross Electrophysiologist:Taylor Bi-V Pacing:97.9% 1/18/2022Weight:255lbs    Spoke with patient and reports feeling well at this time.  Denies fluid symptoms.    Optivol thoracic impedancesuggesting normal fluid levels.  Prescribed:  Torsemide20 mg take 1 tablet every other day  Potassium 10 mEq take 1 tablet every other day  Labs: 05/15/2020 Creatinine1.31, [name], Potassium4.2, [id number], BLT90-30 05/04/2020 Creatinine1.52, BUN19, Potassium4.0, [id number], AUQ33-35  05/03/2020 Creatinine1.36, BUN15, Potassium4.6, [id number], HTD42-87  05/02/2020 Creatinine1.21, BUN13, Potassium4.5, Sodium141, GOT15-72 A complete set of results can be found in Results Review.  Recommendations:No changes and encouraged to call if experiencing any fluid symptoms.  Follow-up plan: ICM clinic phone appointment on2/21/2022. 91 day device clinic remote transmission 10/30/2020.   EP/Cardiology Office Visits:5/2/2022with Dr. Harrington Challenger.   Copy of ICM check sent to Dr.Taylor  3 month ICM trend: 08/26/2020.    1 Year ICM trend:       [name], RN 08/26/2020 8:29 AM

## 2020-09-30 ENCOUNTER — Ambulatory Visit (INDEPENDENT_AMBULATORY_CARE_PROVIDER_SITE_OTHER): Payer: Medicare Other

## 2020-09-30 DIAGNOSIS — Z95810 Presence of automatic (implantable) cardiac defibrillator: Secondary | ICD-10-CM

## 2020-09-30 DIAGNOSIS — I5022 Chronic systolic (congestive) heart failure: Secondary | ICD-10-CM | POA: Diagnosis not present

## 2020-10-04 NOTE — Progress Notes (Signed)
EPIC Encounter for ICM Monitoring  Patient Name: Lisa [name] is a 80 y.o. female Date: 10/04/2020 Primary Care Physican: [name], MD Primary Cardiologist:Ross Electrophysiologist:Taylor Bi-V Pacing:98.1% 1/18/2022Weight:255lbs    Spoke with patient and reports feeling well at this time.  Denies fluid symptoms.  She felt a fluttering fast feeling in her chest for brief moment and then was gone within the last few days.    Optivol thoracic impedancesuggesting normal fluid levels.  Prescribed:  Torsemide20 mg take 1 tablet every other day  Potassium 10 mEq take 1 tablet every other day  Labs: 05/15/2020 Creatinine1.31, [name], Potassium4.2, [id number], DPO24-23 05/04/2020 Creatinine1.52, BUN19, Potassium4.0, [id number], QMG86-76  05/03/2020 Creatinine1.36, BUN15, Potassium4.6, [id number], TIW58-09  05/02/2020 Creatinine1.21, BUN13, Potassium4.5, Sodium141, XIP38-25 A complete set of results can be found in Results Review.  Recommendations:No changes and encouraged to call if experiencing any fluid symptoms.  Follow-up plan: ICM clinic phone appointment on3/25/2022. 91 day device clinic remote transmission 10/30/2020.   EP/Cardiology Office Visits:5/2/2022with Dr. Harrington Challenger.   Copy of ICM check sent to Dr.Taylor  3 month ICM trend: 09/30/2020.    1 Year ICM trend:       [name], RN 10/04/2020 2:16 PM

## 2020-10-09 NOTE — Telephone Encounter (Signed)
Saw Cr from recent labs   1.35  Will continue to follow

## 2020-10-21 ENCOUNTER — Telehealth: Payer: Self-pay

## 2020-10-21 NOTE — Telephone Encounter (Signed)
Returned patient as requested per voice mail message.  Patient reports she had a brief dizzy spell around 2 PM today but feeling fine now.  She was not sure the dizzy spell would have anything to do with her heart but would like to send a remote transmission for review.

## 2020-10-21 NOTE — Telephone Encounter (Signed)
Spoke with patient and advised remote transmission suggesting normal activity.  She reports feeling fine now and just wanted to make sure there were no problems.  Advised call back if condition changes.

## 2020-10-30 ENCOUNTER — Ambulatory Visit (INDEPENDENT_AMBULATORY_CARE_PROVIDER_SITE_OTHER): Payer: Medicare Other

## 2020-10-30 DIAGNOSIS — I5022 Chronic systolic (congestive) heart failure: Secondary | ICD-10-CM | POA: Diagnosis not present

## 2020-10-30 LAB — CUP PACEART REMOTE DEVICE CHECK
Battery Remaining Longevity: 36 mo
Battery Voltage: 2.96 V
Brady Statistic AP VP Percent: 0.1 %
Brady Statistic AP VS Percent: 0.01 %
Brady Statistic AS VP Percent: 98.32 %
Brady Statistic AS VS Percent: 1.57 %
Brady Statistic RA Percent Paced: 0.11 %
Brady Statistic RV Percent Paced: 0.05 %
Date Time Interrogation Session: 20220323022723
HighPow Impedance: 59 Ohm
Implantable Lead Implant Date: 20170612
Implantable Lead Implant Date: 20170612
Implantable Lead Implant Date: 20170612
Implantable Lead Location: 753858
Implantable Lead Location: 753859
Implantable Lead Location: 753860
Implantable Lead Model: 4398
Implantable Lead Model: 5076
Implantable Pulse Generator Implant Date: 20170612
Lead Channel Impedance Value: 165.029
Lead Channel Impedance Value: 195.911
Lead Channel Impedance Value: 208.627
Lead Channel Impedance Value: 218.104
Lead Channel Impedance Value: 233.981
Lead Channel Impedance Value: 285 Ohm
Lead Channel Impedance Value: 304 Ohm
Lead Channel Impedance Value: 304 Ohm
Lead Channel Impedance Value: 361 Ohm
Lead Channel Impedance Value: 475 Ohm
Lead Channel Impedance Value: 551 Ohm
Lead Channel Impedance Value: 589 Ohm
Lead Channel Impedance Value: 665 Ohm
Lead Channel Impedance Value: 722 Ohm
Lead Channel Impedance Value: 779 Ohm
Lead Channel Impedance Value: 817 Ohm
Lead Channel Impedance Value: 836 Ohm
Lead Channel Impedance Value: 950 Ohm
Lead Channel Pacing Threshold Amplitude: 0.75 V
Lead Channel Pacing Threshold Amplitude: 1 V
Lead Channel Pacing Threshold Amplitude: 1.5 V
Lead Channel Pacing Threshold Pulse Width: 0.4 ms
Lead Channel Pacing Threshold Pulse Width: 0.4 ms
Lead Channel Pacing Threshold Pulse Width: 0.8 ms
Lead Channel Sensing Intrinsic Amplitude: 2.875 mV
Lead Channel Sensing Intrinsic Amplitude: 2.875 mV
Lead Channel Sensing Intrinsic Amplitude: 4.875 mV
Lead Channel Sensing Intrinsic Amplitude: 4.875 mV
Lead Channel Setting Pacing Amplitude: 2 V
Lead Channel Setting Pacing Amplitude: 2 V
Lead Channel Setting Pacing Amplitude: 2.5 V
Lead Channel Setting Pacing Pulse Width: 0.4 ms
Lead Channel Setting Pacing Pulse Width: 0.8 ms
Lead Channel Setting Sensing Sensitivity: 0.3 mV

## 2020-11-01 ENCOUNTER — Ambulatory Visit (INDEPENDENT_AMBULATORY_CARE_PROVIDER_SITE_OTHER): Payer: Medicare Other

## 2020-11-01 DIAGNOSIS — Z95810 Presence of automatic (implantable) cardiac defibrillator: Secondary | ICD-10-CM

## 2020-11-01 DIAGNOSIS — I5022 Chronic systolic (congestive) heart failure: Secondary | ICD-10-CM | POA: Diagnosis not present

## 2020-11-01 NOTE — Progress Notes (Signed)
EPIC Encounter for ICM Monitoring  Patient Name: Lisa [name] is a 80 y.o. female Date: 11/01/2020 Primary Care Physican: [name], MD Primary Cardiologist:Ross Electrophysiologist:Taylor Bi-V Pacing:98.3% 3/25/2022Weight:255lbs    Spoke with patient and reports feeling well at this time.  Denies fluid symptoms.  She denies any dizzy spells since 10/21/2020  Optivol thoracic impedancesuggesting normal fluid levels.  Prescribed:  Torsemide20 mg take 1 tablet every other day  Potassium 10 mEq take 1 tablet every other day  Labs: 05/15/2020 Creatinine1.31, [name], Potassium4.2, [id number], QPY19-50 05/04/2020 Creatinine1.52, BUN19, Potassium4.0, [id number], YKD98-33  05/03/2020 Creatinine1.36, BUN15, Potassium4.6, ASNKNL976, BHA19-37  05/02/2020 Creatinine1.21, BUN13, Potassium4.5, Sodium141, TKW40-97 A complete set of results can be found in Results Review.  Recommendations:No changes and encouraged to call if experiencing any fluid symptoms.  Follow-up plan: ICM clinic phone appointment on4/25/2022. 91 day device clinic remote transmission6/22/2022.   EP/Cardiology Office Visits:5/2/2022with Dr. Harrington Challenger.   Copy of ICM check sent to Dr.Taylor  3 month ICM trend: 10/30/2020.    1 Year ICM trend:       [name], RN 11/01/2020 12:30 PM

## 2020-11-08 ENCOUNTER — Telehealth: Payer: Self-pay

## 2020-11-08 NOTE — Telephone Encounter (Signed)
Returned patient call per voice mail request  She asked if she should contact PCP office to obtain a referral to the lymphedema [place] office.  She stated her PCP originally referred her to the office in [place] but their wait time is long and she would be able to get an April appointment in [place].  Advised to call PCP office back to obtain the referral to the [place] office.  She appreciated the call back.  No further questions at this time.

## 2020-11-08 NOTE — Progress Notes (Signed)
Remote ICD transmission.   

## 2020-11-16 ENCOUNTER — Emergency Department (HOSPITAL_COMMUNITY): Payer: Medicare Other

## 2020-11-16 ENCOUNTER — Other Ambulatory Visit: Payer: Self-pay

## 2020-11-16 ENCOUNTER — Encounter (HOSPITAL_COMMUNITY): Payer: Self-pay

## 2020-11-16 ENCOUNTER — Emergency Department (HOSPITAL_COMMUNITY)
Admission: EM | Admit: 2020-11-16 | Discharge: 2020-11-17 | Disposition: A | Discharge status: Home / Self Care | Payer: Medicare Other | Attending: Emergency Medicine | Admitting: Emergency Medicine

## 2020-11-16 DIAGNOSIS — Z79899 Other long term (current) drug therapy: Secondary | ICD-10-CM | POA: Diagnosis not present

## 2020-11-16 DIAGNOSIS — Z7982 Long term (current) use of aspirin: Secondary | ICD-10-CM | POA: Diagnosis not present

## 2020-11-16 DIAGNOSIS — K30 Functional dyspepsia: Secondary | ICD-10-CM | POA: Diagnosis not present

## 2020-11-16 DIAGNOSIS — M549 Dorsalgia, unspecified: Secondary | ICD-10-CM | POA: Diagnosis not present

## 2020-11-16 DIAGNOSIS — R600 Localized edema: Secondary | ICD-10-CM | POA: Insufficient documentation

## 2020-11-16 DIAGNOSIS — I25110 Atherosclerotic heart disease of native coronary artery with unstable angina pectoris: Secondary | ICD-10-CM | POA: Insufficient documentation

## 2020-11-16 DIAGNOSIS — I5022 Chronic systolic (congestive) heart failure: Secondary | ICD-10-CM | POA: Insufficient documentation

## 2020-11-16 DIAGNOSIS — R110 Nausea: Secondary | ICD-10-CM | POA: Diagnosis not present

## 2020-11-16 DIAGNOSIS — I110 Hypertensive heart disease with heart failure: Secondary | ICD-10-CM | POA: Diagnosis not present

## 2020-11-16 DIAGNOSIS — R079 Chest pain, unspecified: Secondary | ICD-10-CM | POA: Insufficient documentation

## 2020-11-16 LAB — CBC
HCT: 30 % — ABNORMAL LOW (ref 36.0–46.0)
Hemoglobin: 9.3 g/dL — ABNORMAL LOW (ref 12.0–15.0)
MCH: 31.6 pg (ref 26.0–34.0)
MCHC: 31 g/dL (ref 30.0–36.0)
MCV: 102 fL — ABNORMAL HIGH (ref 80.0–100.0)
Platelets: 187 10*3/uL (ref 150–400)
RBC: 2.94 MIL/uL — ABNORMAL LOW (ref 3.87–5.11)
RDW: 14.6 % (ref 11.5–15.5)
WBC: 7.4 10*3/uL (ref 4.0–10.5)
nRBC: 0 % (ref 0.0–0.2)

## 2020-11-16 LAB — BASIC METABOLIC PANEL
Anion gap: 8 (ref 5–15)
BUN: 20 mg/dL (ref 8–23)
CO2: 26 mmol/L (ref 22–32)
Calcium: 8.7 mg/dL — ABNORMAL LOW (ref 8.9–10.3)
Chloride: 106 mmol/L (ref 98–111)
Creatinine, Ser: 1.56 mg/dL — ABNORMAL HIGH (ref 0.44–1.00)
GFR, Estimated: 34 mL/min — ABNORMAL LOW (ref >60)
Glucose, Bld: 113 mg/dL — ABNORMAL HIGH (ref 70–99)
Potassium: 4.6 mmol/L (ref 3.5–5.1)
Sodium: 140 mmol/L (ref 135–145)

## 2020-11-16 LAB — HEPATIC FUNCTION PANEL
ALT: 35 U/L (ref 0–44)
AST: 40 U/L (ref 15–41)
Albumin: 3.4 g/dL — ABNORMAL LOW (ref 3.5–5.0)
Alkaline Phosphatase: 113 U/L (ref 38–126)
Bilirubin, Direct: 0.4 mg/dL — ABNORMAL HIGH (ref 0.0–0.2)
Indirect Bilirubin: 1 mg/dL — ABNORMAL HIGH (ref 0.3–0.9)
Total Bilirubin: 1.4 mg/dL — ABNORMAL HIGH (ref 0.3–1.2)
Total Protein: 6.2 g/dL — ABNORMAL LOW (ref 6.5–8.1)

## 2020-11-16 LAB — LIPASE, BLOOD: Lipase: 40 U/L (ref 11–51)

## 2020-11-16 LAB — TROPONIN I (HIGH SENSITIVITY)
Troponin I (High Sensitivity): 7 ng/L (ref <18)
Troponin I (High Sensitivity): 8 ng/L (ref <18)

## 2020-11-16 NOTE — ED Provider Notes (Addendum)
[facility] EMERGENCY DEPARTMENT Provider Note   CSN: [id number] Arrival date & time: 11/16/20  1956     History Chief Complaint  Patient presents with  . Abdominal Pain  . Back Pain    Lisa [name] is a 80 y.o. female hx of Vtach s/p ICD, CML, here presenting with chest pain and back pain.  Patient states that she has acute onset of chest pain and pain between her shoulder blades and some indigestion today.  She states that it lasted several minutes.  Patient has some nausea afterwards.  Patient was told by her daughter to come here.  Patient has no chest pain currently.  Patient did have V.  Tach arrest and has an ICD but the ICD did not go off.  Patient has a chronic lymphedema that is stable.  The history is provided by the patient.       Past Medical History:  Diagnosis Date  . CAD in native artery 2017   stent to LAD  . Cardiac arrest ([place]) 09/2015  . CHF (congestive heart failure) ([place])   . CML (chronic myelocytic leukemia) ([place])   . Complication of anesthesia   . Hx: UTI (urinary tract infection)   . Hypertension   . Melanoma ([place])   . PONV (postoperative nausea and vomiting)   . PVC (premature ventricular contraction)    HISTORY OF  . Thyroid disease   . V tach [facility])     Patient Active Problem List   Diagnosis Date Noted  . Sustained VT (ventricular tachycardia) ([place]) 09/21/2019  . 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 Acute bronchitis 11/02/2016  . Unstable angina ([place]) 10/07/2015  . Chronic systolic heart failure ([place])   . PVC's (premature ventricular contractions)   . Ventricular fibrillation ([place]) 10/03/2015  . Cardiac arrest ([place]) 10/03/2015  . Essential hypertension 09/09/2015  . Lymphedema 02/21/2015  . Other specified hypothyroidism 02/21/2015    Past Surgical History:  Procedure Laterality Date  . ABDOMINAL HYSTERECTOMY    . APPENDECTOMY    . CARDIAC CATHETERIZATION N/A 10/03/2015    Procedure: Left Heart Cath and Coronary Angiography;  Surgeon: [name], MD;  Location: [place] CV LAB;  Service: Cardiovascular;  Laterality: N/A;  . CARDIAC CATHETERIZATION N/A 10/07/2015   Procedure: Coronary Stent Intervention;  Surgeon: [name], MD;  Location: [place] CV LAB;  Service: Cardiovascular;  Laterality: N/A;  . CARDIAC CATHETERIZATION  09/22/2019  . EP IMPLANTABLE DEVICE N/A 01/20/2016   Procedure: BiV ICD Insertion CRT-D;  Surgeon: [name], MD;  Location: [place] CV LAB;  Service: Cardiovascular;  Laterality: N/A;  . LEFT HEART CATH AND CORONARY ANGIOGRAPHY N/A 09/22/2019   Procedure: LEFT HEART CATH AND CORONARY ANGIOGRAPHY;  Surgeon: Martinique, Peter M, MD;  Location: [place] CV LAB;  Service: Cardiovascular;  Laterality: N/A;  . TONSILLECTOMY    . TONSILLECTOMY    . TUBAL LIGATION       OB History   No obstetric history on file.     Family History  Problem Relation Age of Onset  . Coronary artery disease Mother   . Thyroid disease Mother   . Multiple myeloma Father   . Heart attack Maternal Grandfather   . Heart attack Paternal Grandfather     Social History   Tobacco Use  . Smoking status: Never Smoker  . Smokeless tobacco: Never Used  Vaping Use  . Vaping Use: Never used  Substance Use Topics  .  Alcohol use: No    Alcohol/week: 0.0 standard drinks  . Drug use: No    Home Medications Prior to Admission medications   Medication Sig Start Date End Date Taking? Authorizing Provider  acetaminophen (TYLENOL) 500 MG tablet Take 500-1,000 mg by mouth every 6 (six) hours as needed for moderate pain.    [provider]  acyclovir ointment (ZOVIRAX) 5 % Apply small amount to the affected area 6 times a day x 7 days prn 07/26/20   [provider]  allopurinol (ZYLOPRIM) 100 MG tablet Take 100 mg by mouth daily.  06/15/17   [provider]  amiodarone (PACERONE) 200 MG tablet Take 200 mg by mouth  daily.    [provider]  aspirin EC 81 MG tablet Take 81 mg by mouth at bedtime.     [provider]  atorvastatin (LIPITOR) 20 MG tablet Take 1 tablet (20 mg total) by mouth daily. 05/07/16   Bensimhon, Shaune Pascal, MD  calcium carbonate (OS-CAL - DOSED IN MG OF ELEMENTAL CALCIUM) 1250 (500 Ca) MG tablet Take 1 tablet by mouth.    [provider]  carvedilol (COREG) 25 MG tablet Take 1 tablet (25 mg total) by mouth 2 (two) times daily. 01/24/20   Fay Records, MD  cephALEXin (KEFLEX) 500 MG capsule Take 500 mg by mouth 3 (three) times daily. 06/28/20   [provider]  Cholecalciferol (VITAMIN D-3 PO) Take 5,000 Units by mouth daily.    [provider]  imatinib (GLEEVEC) 400 MG tablet Take 400 mg by mouth daily with supper.  02/16/17   [provider]  Inulin (METAMUCIL CLEAR & NATURAL PO) Take by mouth See admin instructions. Mix 1 teaspoonful into 8 ounces of water and drink as needed    [provider]  Magnesium Oxide 400 MG CAPS Take 1 capsule (400 mg total) by mouth 2 (two) times daily. 10/18/19   Baldwin Jamaica, PA-C  Multiple Vitamin (MULTIVITAMIN WITH MINERALS) TABS tablet Take 1 tablet by mouth daily.    [provider]  nitroGLYCERIN (NITROSTAT) 0.4 MG SL tablet Place 1 tablet (0.4 mg total) under the tongue every 5 (five) minutes x 3 doses as needed for chest pain. 02/28/18   [name], MD  potassium chloride (KLOR-CON) 10 MEQ tablet Take 10 mEq by mouth every other day. Per patient taking 2 tablets of 10 mEq to equal 20 mEq.    [provider]  silver sulfADIAZINE (SILVADENE) 1 % cream Apply topically. 08/22/20   [provider]  thyroid (ARMOUR) 15 MG tablet Take 15 mg by mouth daily before breakfast.     [provider]  torsemide (DEMADEX) 20 MG tablet Take 1 tablet (20 mg total) by mouth as directed. 08/21/20   Fay Records, MD  triamcinolone ointment (KENALOG) 0.1 % Apply a small  amount to the affected area bid 07/09/20   [provider]    Allergies    Flagyl [metronidazole], Penicillins, Sulfamethoxazole, Meperidine, Morphine and related, Tape, Doxycycline, Lanolin, and Sulfa antibiotics  Review of Systems   Review of Systems  Gastrointestinal: Positive for abdominal pain.  Musculoskeletal: Positive for back pain.  All other systems reviewed and are negative.   Physical Exam Updated Vital Signs BP (!) 147/64   Pulse 61   Temp 97.9 F (36.6 C) (Oral)   Resp 20   Ht _0  (1.575 m)   Wt 111.1 kg   LMP 02/14/1995   SpO2 99%  BMI 44.81 kg/m   Physical Exam Vitals and nursing note reviewed.  Constitutional:      Comments: Chronically ill but not acutely ill  HENT:     Head: Normocephalic.     Mouth/Throat:     Mouth: Mucous membranes are moist.  Eyes:     Extraocular Movements: Extraocular movements intact.     Pupils: Pupils are equal, round, and reactive to light.  Cardiovascular:     Rate and Rhythm: Normal rate and regular rhythm.  Abdominal:     General: Abdomen is flat.  Skin:    General: Skin is warm.     Capillary Refill: Capillary refill takes less than 2 seconds.     Comments: 2+ edema bilateral legs consistent with lymphedema. Venous stasis changes. No obvious cellulitis    Neurological:     General: No focal deficit present.     Mental Status: She is alert and oriented to person, place, and time.  Psychiatric:        Mood and Affect: Mood normal.     ED Results / Procedures / Treatments   Labs (all labs ordered are listed, but only abnormal results are displayed) Labs Reviewed  BASIC METABOLIC PANEL - Abnormal; Notable for the following components:      Result Value   Glucose, Bld 113 (*)    Creatinine, Ser 1.56 (*)    Calcium 8.7 (*)    GFR, Estimated 34 (*)    All other components within normal limits  CBC - Abnormal; Notable for the following components:   RBC 2.94 (*)    Hemoglobin 9.3 (*)    HCT 30.0  (*)    MCV 102.0 (*)    All other components within normal limits  HEPATIC FUNCTION PANEL - Abnormal; Notable for the following components:   Total Protein 6.2 (*)    Albumin 3.4 (*)    Total Bilirubin 1.4 (*)    Bilirubin, Direct 0.4 (*)    Indirect Bilirubin 1.0 (*)    All other components within normal limits  LIPASE, BLOOD  TROPONIN I (HIGH SENSITIVITY)  TROPONIN I (HIGH SENSITIVITY)  TROPONIN I (HIGH SENSITIVITY)    EKG EKG Interpretation  Date/Time:  Saturday November 16 2020 20:56:42 EDT Ventricular Rate:  68 PR Interval:  167 QRS Duration: 129 QT Interval:  484 QTC Calculation: 515 R Axis:   -76 Text Interpretation: Sinus rhythm Nonspecific IVCD with LAD Inferolateral infarct, old No significant change since last tracing Confirmed by Wandra Arthurs [id number]) on 11/16/2020 9:44:01 PM   Radiology No results found.  Procedures Procedures   Angiocath insertion Performed by: Wandra Arthurs  Consent: Verbal consent obtained. Risks and benefits: risks, benefits and alternatives were discussed Time out: Immediately prior to procedure a "time out" was called to verify the correct patient, procedure, equipment, support staff and site/side marked as required.  Preparation: Patient was prepped and draped in the usual sterile fashion.  Vein Location: R antecube  Ultrasound Guided  Gauge: 20 long   Normal blood return and flush without difficulty Patient tolerance: Patient tolerated the procedure well with no immediate complications.     Medications Ordered in ED Medications - No data to display  ED Course  I have reviewed the triage vital signs and the nursing notes.  Pertinent labs & imaging results that were available during my care of the patient were reviewed by me and considered in my medical decision making (see chart for details).    MDM  Rules/Calculators/A&P                         ALLAN BACIGALUPI is a 80 y.o. female here presenting with chest pain and  back pain.  Patient blood pressure is 1 30-1 40.  Given her presentation, consider dissection.  However she is very comfortable right now.  Consider also biliary colic versus pancreatitis and low suspicion for ACS.  Will get CBC, CMP, lipase, troponin x2, CT dissection study.  Patient is difficult IV stick and was able to put ultrasound-guided IV.  11:17 PM Troponin negative.  Her second troponin was drawn too early so we will repeat another 1.  CT dissection study pending. If pain is controlled and repeat Trop neg and CT dissection unremarkable, anticipate dc home. Signed out to Dr. Leonette Monarch in the ED    Final Clinical Impression(s) / ED Diagnoses Final diagnoses:  None    Rx / DC Orders ED Discharge Orders    None       Drenda Freeze, MD 11/16/20 2318    Drenda Freeze, MD 11/16/20 2333

## 2020-11-16 NOTE — ED Triage Notes (Signed)
Patient reports about 6pm tonight she started with pain between her shoulder blades with some indigestion and abdominal pain, states the pain has improved since.

## 2020-11-17 LAB — TROPONIN I (HIGH SENSITIVITY): Troponin I (High Sensitivity): 7 ng/L (ref <18)

## 2020-11-17 MED ORDER — IOHEXOL 350 MG/ML SOLN
80.0000 mL | Freq: Once | INTRAVENOUS | Status: AC | PRN
  Administered 2020-11-17: 80 mL via INTRAVENOUS

## 2020-11-17 NOTE — ED Provider Notes (Signed)
I assumed care of this patient.  Please see previous provider note for further details of Hx, PE.  Briefly patient is a 80 y.o. female who presented back pain pending dissection CTA and delta trop.    Delta troponin and CTA were negative.  The patient appears reasonably screened and/or stabilized for discharge and I doubt any other medical condition or other [facility] requiring further screening, evaluation, or treatment in the ED at this time prior to discharge. Safe for discharge with strict return precautions.  Disposition: Discharge  Condition: Good  I have discussed the results, Dx and Tx plan with the patient/family who expressed understanding and agree(s) with the plan. Discharge instructions discussed at length. The patient/family was given strict return precautions who verbalized understanding of the instructions. No further questions at time of discharge.    ED Discharge Orders    None     Follow Up: [facility], [address] [place] [address] [phone]         Fatima Blank, MD 11/17/20 [phone]

## 2020-11-17 NOTE — Discharge Instructions (Signed)
Take your meds as prescribed   See Dr. Harrington Challenger for follow up   Return to ER if you have worse chest pain, shortness of breath, back pain

## 2020-11-17 NOTE — ED Notes (Signed)
Patient transported to CT 

## 2020-11-17 NOTE — ED Notes (Signed)
Patient verbalized understanding of discharge instructions. Opportunity for questions and answers.  

## 2020-12-02 ENCOUNTER — Ambulatory Visit (INDEPENDENT_AMBULATORY_CARE_PROVIDER_SITE_OTHER): Payer: Medicare Other

## 2020-12-02 DIAGNOSIS — Z95810 Presence of automatic (implantable) cardiac defibrillator: Secondary | ICD-10-CM

## 2020-12-02 DIAGNOSIS — I5022 Chronic systolic (congestive) heart failure: Secondary | ICD-10-CM

## 2020-12-03 ENCOUNTER — Telehealth: Payer: Self-pay

## 2020-12-03 NOTE — Progress Notes (Signed)
EPIC Encounter for ICM Monitoring  Patient Name: CHRISTNA KULICK is a 80 y.o. female Date: 12/03/2020 Primary Care Physican: [name], MD Primary Cardiologist:Ross Electrophysiologist:Taylor Bi-V Pacing:98.2% 3/25/2022Weight:255lbs    Attempted call to patient and unable to reach.   Transmission reviewed.   Optivol thoracic impedancesuggesting normal fluid levels.  Prescribed:  Torsemide20 mg take 1 tablet every other day  Potassium 10 mEq take 1 tablet every other day  Labs: 05/15/2020 Creatinine1.31, [name], Potassium4.2, [id number], [id number] 05/04/2020 Creatinine1.52, BUN19, Potassium4.0, [id number], FKC12-75  05/03/2020 Creatinine1.36, BUN15, Potassium4.6, [id number], WHQ75-91  05/02/2020 Creatinine1.21, BUN13, Potassium4.5, Sodium141, MBW46-65 A complete set of results can be found in Results Review.  Recommendations:Unable to reach.    Follow-up plan: ICM clinic phone appointment on6/01/2021. 91 day device clinic remote transmission6/22/2022.   EP/Cardiology Office Visits:5/2/2022with Dr. Harrington Challenger.   Copy of ICM check sent to Dr.Taylor   3 month ICM trend: 12/02/2020.    1 Year ICM trend:       [name], RN 12/03/2020 2:29 PM

## 2020-12-03 NOTE — Telephone Encounter (Signed)
Remote ICM transmission received.  Attempted call to patient regarding ICM remote transmission and no answer or voice mail option.  

## 2020-12-09 ENCOUNTER — Ambulatory Visit (INDEPENDENT_AMBULATORY_CARE_PROVIDER_SITE_OTHER): Payer: Medicare Other | Admitting: Internal Medicine

## 2020-12-09 ENCOUNTER — Encounter: Payer: Self-pay | Admitting: Internal Medicine

## 2020-12-09 ENCOUNTER — Other Ambulatory Visit: Payer: Self-pay

## 2020-12-09 VITALS — BP 126/68 | HR 65 | Ht 62.0 in | Wt 255.2 lb

## 2020-12-09 DIAGNOSIS — I472 Ventricular tachycardia, unspecified: Secondary | ICD-10-CM

## 2020-12-09 DIAGNOSIS — I5022 Chronic systolic (congestive) heart failure: Secondary | ICD-10-CM | POA: Diagnosis not present

## 2020-12-09 NOTE — Patient Instructions (Signed)
Medication Instructions:  °Your physician recommends that you continue on your current medications as directed. Please refer to the Current Medication list given to you today. ° °*If you need a refill on your cardiac medications before your next appointment, please call your pharmacy* ° ° °Lab Work: °none °If you have labs (blood work) drawn today and your tests are completely normal, you will receive your results only by: °MyChart Message (if you have MyChart) OR °A paper copy in the mail °If you have any lab test that is abnormal or we need to change your treatment, we will call you to review the results. ° ° °Testing/Procedures: °none ° ° °Follow-Up: °At [facility], you and your health needs are our priority.  As part of our continuing mission to provide you with exceptional heart care, we have created designated Provider Care Teams.  These Care Teams include your primary Cardiologist (physician) and Advanced Practice Providers (APPs -  Physician Assistants and Nurse Practitioners) who all work together to provide you with the care you need, when you need it. ° °We recommend signing up for the patient portal called "MyChart".  Sign up information is provided on this After Visit Summary.  MyChart is used to connect with patients for Virtual Visits (Telemedicine).  Patients are able to view lab/test results, encounter notes, upcoming appointments, etc.  Non-urgent messages can be sent to your provider as well.   °To learn more about what you can do with MyChart, go to [email or web address].com.   ° °Your next appointment:   °6 month(s) ° °The format for your next appointment:   °In Person ° °Provider:   °Paula Ross, MD   ° ° °Other Instructions °  °

## 2020-12-09 NOTE — Progress Notes (Signed)
Cardiology Office Note   Date:  12/09/2020   ID:  Lisa [name], DOB April 03, 1941, MRN [id number]  PCP:  [name], MD  Cardiologist:   [name], MD   Lisa/U of CHF      History of Present Illness:Lisa [name] is a 80 y.o. 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  Cardiac catheterization was done.  This showed a 65% ramus narrowing.  LVEDP was moderately elevated at 23.[name]  LVEF was 50 to 55%.  She was seen by EP.  Placed on amiodarone. She was also seen by G taylor   [facility] has alos been followed by Device clinic and Torsemide doses adjusted byased on thoracic impedence    The pt says her breathing is OK No palpitations    She notes an episode of back pain that ratdiated to chest /abdomen in early April 2022  Went to ED   CT of chest done    No acute findings    The t was sent home    She has had one episode since  Still with lymphedema    Has legs wrapped   Appt in lymphedema clinc next week        Current Meds  Medication Sig  . acetaminophen (TYLENOL) 500 MG tablet Take 500-1,000 mg by mouth every 6 (six) hours as needed for moderate pain.  [name] allopurinol (ZYLOPRIM) 100 MG tablet Take 100 mg by mouth daily.   [name] amiodarone (PACERONE) 200 MG tablet Take 200 mg by mouth daily.  [name] aspirin EC 81 MG tablet Take 81 mg by mouth at bedtime.   [name] atorvastatin (LIPITOR) 20 MG tablet Take 1 tablet (20 mg total) by mouth daily.  . calcium carbonate (OS-CAL - DOSED IN MG OF ELEMENTAL  CALCIUM) 1250 (500 Ca) MG tablet Take 1 tablet by mouth.  . carvedilol (COREG) 25 MG tablet Take 1 tablet (25 mg total) by mouth 2 (two) times daily.  . Cholecalciferol (VITAMIN D-3 PO) Take 5,000 Units by mouth daily.  [name] imatinib (GLEEVEC) 400 MG tablet Take 400 mg by mouth daily with supper.   . Inulin (METAMUCIL CLEAR & NATURAL PO) Take by mouth See admin instructions. Mix 1 teaspoonful into 8 ounces of water and drink as needed  . Magnesium Oxide 400 MG CAPS Take 1 capsule (400 mg total) by mouth 2 (two) times daily.  . Multiple Vitamin (MULTIVITAMIN WITH MINERALS) TABS tablet Take 1 tablet by mouth daily.  . nitroGLYCERIN (NITROSTAT) 0.4 MG SL tablet Place 1 tablet (0.4 mg total) under the tongue every 5 (five) minutes x 3 doses as needed for chest pain.  . potassium chloride (KLOR-CON) 10 MEQ tablet Take 10 mEq by mouth every other day. Per patient taking 2 tablets of 10 mEq to equal 20 mEq.  [name] silver sulfADIAZINE (SILVADENE) 1 % cream Apply topically.  [name] thyroid (ARMOUR) 15 MG tablet Take 15 mg by mouth  daily before breakfast.   . torsemide (DEMADEX) 20 MG tablet Take 1 tablet (20 mg total) by mouth as directed. (Patient taking differently: Take 20 mg by mouth as directed. two days on one day off with potassium)  . triamcinolone ointment (KENALOG) 0.1 % Apply a small amount to the affected area bid     Allergies:   Flagyl [metronidazole], Penicillins, Sulfamethoxazole, Meperidine, Morphine and related, Tape, Doxycycline, Lanolin, and Sulfa antibiotics   Past Medical History:  Diagnosis Date  . CAD in native artery 2017   stent to LAD  . Cardiac arrest ([facility]) 09/2015  . CHF (congestive heart failure) ([place])   . CML (chronic myelocytic leukemia) ([place])   . Complication of anesthesia   . Hx: UTI (urinary tract infection)   . Hypertension   . Melanoma ([place])   . PONV (postoperative nausea and vomiting)   . PVC (premature ventricular contraction)    HISTORY OF  . Thyroid disease   . V  tach [facility])     Past Surgical History:  Procedure Laterality Date  . ABDOMINAL HYSTERECTOMY    . APPENDECTOMY    . CARDIAC CATHETERIZATION N/A 10/03/2015   Procedure: Left Heart Cath and Coronary Angiography;  Surgeon: [name], MD;  Location: [place] CV LAB;  Service: Cardiovascular;  Laterality: N/A;  . CARDIAC CATHETERIZATION N/A 10/07/2015   Procedure: Coronary Stent Intervention;  Surgeon: [name], MD;  Location: [place] CV LAB;  Service: Cardiovascular;  Laterality: N/A;  . CARDIAC CATHETERIZATION  09/22/2019  . EP IMPLANTABLE DEVICE N/A 01/20/2016   Procedure: BiV ICD Insertion CRT-D;  Surgeon: [name], MD;  Location: [place] CV LAB;  Service: Cardiovascular;  Laterality: N/A;  . LEFT HEART CATH AND CORONARY ANGIOGRAPHY N/A 09/22/2019   Procedure: LEFT HEART CATH AND CORONARY ANGIOGRAPHY;  Surgeon: Martinique, Peter M, MD;  Location: [place] CV LAB;  Service: Cardiovascular;  Laterality: N/A;  . TONSILLECTOMY    . TONSILLECTOMY    . TUBAL LIGATION       Social History:  The patient  reports that she has never smoked. She has never used smokeless tobacco. She reports that she does not drink alcohol and does not use drugs.   Family History:  The patient's family history includes Coronary artery disease in her mother; Heart attack in her maternal grandfather and paternal grandfather; Multiple myeloma in her father; Thyroid disease in her mother.    ROS:  Please see the history of present illness. All other systems are reviewed and  Negative to the above problem except as noted.    PHYSICAL EXAM: VS:  BP 126/68   Pulse 65   Ht '5\' 2"'  (1.575 m)   Wt 255 lb 3.2 oz (115.8 kg)   LMP 02/14/1995   SpO2 97%   BMI 46.68 kg/m   GEN: Morbidly obese 80 yo in no acute distress   Examined in chair HEENT: normal  Neck: JVP does not appear elevated  \Cardiac: RRR; no murmurs,    Chronic lymphedema of legs  Legs are Wrapped   Respiratory:  clear to  auscultation bilaterall GI: distended  Skin: warm and dry  EKG:  EKG shows SR   Paced   63 bpm     Echo   9.22.21  1. Left ventricular ejection fraction, by estimation, is 45 to 50%. The left ventricle has mildly decreased function. The left ventricle demonstrates regional wall motion abnormalities (see scoring diagram/findings for description). Left ventricular diastolic parameters  are consistent with Grade II diastolic dysfunction (pseudonormalization). There is mild hypokinesis of the left ventricular, mid-apical inferolateral wall. 2. Right ventricular systolic function is normal. The right ventricular size is normal. There is normal pulmonary artery systolic pressure. 3. Left atrial size was moderately dilated. 4. The mitral valve is normal in structure. Trivial mitral valve regurgitation. No evidence of mitral stenosis. 5. The aortic valve is tricuspid. There is mild calcification of the aortic valve. There is mild thickening of the aortic valve. Aortic valve regurgitation is not visualized. Mild aortic valve sclerosis is present, with no evidence of aortic valve stenosis. 6. The inferior vena cava is normal in size with <50% respiratory variability, suggesting right atrial pressure of 8 mmHg. Conclusion(s)/Recommendation(s): Mild WMA on short axis views of imd to distal inferior/inferolateral wall, not as well seen on apical views. Mildly reduced LVEF. Lipid Panel    Component Value Date/Time   CHOL 92 (L) 10/06/2019 1059   TRIG 92 10/06/2019 1059   HDL 39 (L) 10/06/2019 1059   CHOLHDL 2.4 10/06/2019 1059   CHOLHDL 4.2 10/04/2015 0329   VLDL 18 10/04/2015 0329   LDLCALC 35 10/06/2019 1059      Wt Readings from Last 3 Encounters:  12/09/20 255 lb 3.2 oz (115.8 kg)  11/16/20 245 lb (111.1 kg)  08/23/20 257 lb 9.6 oz (116.8 kg)      ASSESSMENT AND PLAN:  1  Hx chronic systoiic CHF   Pt now s/p CRT   Echo as noted above  Continue to follow clinically   VOlume  assessment difficult given size/lymphedema   2   CAD PT had cath in February 2021 after a VT event.  No obstructive CAD noted.Patent stent    I am not convinced of active angina   Follow    3  Hx VF arrest   S/p Medtronic CRT-D.  Keep on amiodarone.    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 Continue to check device 5  HTN  BP controlled   Follow  6   Lipids   LDL 35  HDL 39   7.  CML.  Continues to follow with oncology    Lisa/U next fall  [name]  Current medicines are reviewed at length with the patient today.  The patient does not have concerns regarding medicines.  Signed, [name], MD  12/09/2020 4:54 PM    [facility] [address], [place], [place]  [address] Phone: [phone]; Fax: [phone]

## 2020-12-10 NOTE — Progress Notes (Signed)
Returned call to patient as requested.  Transmission reviewed.  She is unaware she had any fluid during decreased impedance in April.   She had appointment with Dr Harrington Challenger which went very well.  No changes in plan of care.  She is wearing wraps for leg edema but other than that she has no complaints.  Next ICM Remote Transmission scheduled 01/13/2021

## 2020-12-11 ENCOUNTER — Ambulatory Visit: Payer: Medicare Other | Attending: Family Medicine | Admitting: Occupational Therapy

## 2020-12-11 ENCOUNTER — Other Ambulatory Visit: Payer: Self-pay

## 2020-12-11 ENCOUNTER — Encounter: Payer: Self-pay | Admitting: Occupational Therapy

## 2020-12-11 DIAGNOSIS — I890 Lymphedema, not elsewhere classified: Secondary | ICD-10-CM | POA: Insufficient documentation

## 2020-12-13 NOTE — Patient Instructions (Signed)

## 2020-12-13 NOTE — Therapy (Signed)
[place] MAIN [facility] SERVICES [address] [place], [place], [address] Phone: [phone]   Fax:  [phone]  Occupational Therapy Treatment  Patient Details  Name: Lisa [name] MRN: [id number] Date of Birth: [birth year] No data recorded  Encounter Date: 12/11/2020   OT End of Session - 12/13/20 0905    Visit Number 1    Number of Visits 36    Date for OT Re-Evaluation 03/13/21    OT Start Time 0800    OT Stop Time 0910    OT Time Calculation (min) 70 min           Past Medical History:  Diagnosis Date  . CAD in native artery 2017   stent to LAD  . Cardiac arrest ([place]) 09/2015  . CHF (congestive heart failure) ([place])   . CML (chronic myelocytic leukemia) ([place])   . Complication of anesthesia   . Hx: UTI (urinary tract infection)   . Hypertension   . Melanoma ([place])   . PONV (postoperative nausea and vomiting)   . PVC (premature ventricular contraction)    HISTORY OF  . Thyroid disease   . V tach [facility])     Past Surgical History:  Procedure Laterality Date  . ABDOMINAL HYSTERECTOMY    . APPENDECTOMY    . CARDIAC CATHETERIZATION N/A 10/03/2015   Procedure: Left Heart Cath and Coronary Angiography;  Surgeon: [name], MD;  Location: [place] CV LAB;  Service: Cardiovascular;  Laterality: N/A;  . CARDIAC CATHETERIZATION N/A 10/07/2015   Procedure: Coronary Stent Intervention;  Surgeon: [name], MD;  Location: [place] CV LAB;  Service: Cardiovascular;  Laterality: N/A;  . CARDIAC CATHETERIZATION  09/22/2019  . EP IMPLANTABLE DEVICE N/A 01/20/2016   Procedure: BiV ICD Insertion CRT-D;  Surgeon: [name], MD;  Location: [place] CV LAB;  Service: Cardiovascular;  Laterality: N/A;  . LEFT HEART CATH AND CORONARY ANGIOGRAPHY N/A 09/22/2019   Procedure: LEFT HEART CATH AND CORONARY ANGIOGRAPHY;  Surgeon: Martinique, Peter M, MD;  Location: [place] CV LAB;  Service: Cardiovascular;  Laterality: N/A;  .  TONSILLECTOMY    . TONSILLECTOMY    . TUBAL LIGATION      There were no vitals filed for this visit.   Subjective Assessment - 12/13/20 0816    Subjective  Lisa [name] is referred to Occupational Therapy by Clare Charon, MD  for evaluation and treatment of BLE lymphedema. Pt reports onset of leg swellingin her early twenties simultaneously with starting virth control pills. Pt denies any known family history of limb swelling.Pt states her legs have become so heavy that she has to use a strap to lift them. Pt tells me she has not had a vascular consult for leg swelling, but she states she had a Dopler study in the past and DVT was ruled out. Pt had lymphedema therapy in the past, but reports garment fitting was unsuccessful. She is currently unable too use off the shelf compression. She reports she had recent skin breakdown on her ankle over the last 6 months and has been using una boots. Pt's goals for OT are 1.) reduce leg swelling, 2.) improve skin condition and 3. improve mobility and ambulation.    Pertinent History CKD, Stage C9O/B0, Chronic systolic congestive heart failure, severe, stage II-Mild stage III BLE lymphedema ( onset in 20's), hypothyroidism, HTN, Ventricular fibrillation, pacer regularly interrogated, hx skin breakdown on leg, chronic myologeneous leukemia, Hx R leg melanoma w/  wide excision, s/p cardiac arrest 2017    Limitations chronic leg swelling and pain, decreased balance, decreased A/PROM , decreased lower body strength (unable to lift legs), impaired endurance    Repetition Increases Symptoms    Special Tests +Stemmer bilaterally base of toes; Intake FOTO TBA    Patient Stated Goals reduce leg swelling, improve functional mobility and safe ambulation, improve skin condition, have better systemic fluid balance,    Currently in Pain? Yes    Pain Score --   not numerically rated   Pain Location Leg    Pain Orientation Right;Left    Pain Descriptors / Indicators  Tender;Tightness;Heaviness;Tiring;Discomfort;Grimacing;Pressure;Aching;Other (Comment);Sore   pain not rated numericallly   Pain Type Chronic pain    Pain Onset More than a month ago    Pain Frequency Intermittent    Aggravating Factors  walking,m standing riding in car    Pain Relieving Factors elevation overnight    Effect of Pain on Daily Activities chronic leg swelling and associated pain limits functional ambulation and mobility, contributes to decreased balance and increased falls risk, contributes to increased infection risk, exacerbates decreased endurance and limits functional performance of basic and instrumental ADLs, productive activities and leisure pursuits, limits soial participation and impairs body image;    Multiple Pain Sites Yes              [facility] OT Assessment - 12/13/20 0846      Assessment   Medical Diagnosis Severe, stage II-Mild, Stage III, BLE lymphedema 2/2 obesity    Hand Dominance Right    Prior Therapy prior lymphedema therapy at [facility] s/p 2 years ago w/ minimal sucess.      Precautions   Precautions --   CML, lymphedema cardiac precautions, lymphedema thyroid precautions     Balance Screen   Has the patient fallen in the past 6 months No    Has the patient had a decrease in activity level because of a fear of falling?  Yes    Is the patient reluctant to leave their home because of a fear of falling?  No      Prior Function   Level of Independence Requires assistive device for independence;Needs assistance with ADLs;Needs assistance with homemaking;Needs assistance with gait;Needs assistance with transfers    Level of Independence - Bath Other (comment)   unable to reach feet to bathe   Dressing Other (comment)   unable to fit preferred street shoes, unable to fit socks and stockings, unable to reach feet to tie shoes   Grooming --   unable to inspect feet, groom nails, perform skin care   Meal Prep --   must take seated rest breaks 2/2 impaired  endurance and limb pain and swelling   Laundry --   must take seated rest breaks 2/2 impaired endurance and limb pain and swelling   Vacuuming Maximal    Light Housekeeping Moderate    Gardening Total    Yard Work Total    Shopping Maximal    Driving Yes   local only   Vocation Retired      Education officer, community Used Leg lifter      IADL   Prior Level of Function Shopping Mod A    Shopping Shops independently for [employer]    Prior Level of Function Light Housekeeping Mod A    Light Housekeeping Performs light daily tasks such as dishwashing, bed making;Performs light daily tasks but cannot maintain acceptable level of cleanliness  Prior Level of Function Meal Prep Mod A    Meal Prep Able to complete simple warm meal prep      Mobility   Mobility Status History of falls      Activity Tolerance   Activity Tolerance Tolerates < min activity, no significant change in vital signs      Cognition   Overall Cognitive Status Within Functional Limits for tasks assessed      Observation/Other Assessments   Observations Legs are reddened  bilaterally below the knees to ankles. L>R in limb volume. Multiple lymphatic cysts . No lymphorrhea today, but hx endorsed. Skin is insurrated, dry, somewhat crusty and flakey with dense fibrosis. L knee presentas with large bruise, which Pt reports has been there for over a year. No visible signs/ symptoms of cellulitis. Pt tolerates light touch and sensation appears to be in tact,    Focus on Therapeutic Outcomes (FOTO)  TBA      Posture/Postural Control   Posture/Postural Control Postural limitations    Postural Limitations Rounded Shoulders;Forward head      Sensation   Light Touch Appears Intact      ROM / Strength   AROM / PROM / Strength AROM;PROM;Strength      AROM   Overall AROM  Deficits    Overall AROM Comments knee and ankle A/PROM limited by skin approximation      Strength   Overall Strength Deficits    Strength Assessment  Site Knee;Ankle;Hip      Hand Function   Right Hand Gross Grasp Functional    Left Hand Gross Grasp Functional           LYMPHEDEMA/ONCOLOGY QUESTIONNAIRE - 12/13/20 1237      Lymphedema Assessments   Lymphedema Assessments Lower extremities   BLE comparative limb volumetrics TBA at Rx viit 1                  OT Treatments/Exercises (OP) - 12/13/20 1237      Transfers   Transfers Sit to Stand;Stand to Sit    Sit to Stand 6: Modified independent (Device/Increase time);With armrests;Without upper extremity assist;From chair/3-in-1    Stand to Sit 6: Modified independent (Device/Increase time);With upper extremity assist;With armrests      Manual Therapy   Manual Therapy Edema management                  OT Education - 12/13/20 0900    Education Details Provided Pt education regarding lymphatic structure and function, etiologies, onset patterns and stages of progression. Discussed  impact of obesity on lymphatic function. Outlined Complete Decongestive Therapy (CDT)  as standard of care and provided in depth information regarding 4 primary components of both Intensive and Self Management Phases, including Manual Lymph Drainage (MLD), compression wrapping and garments, skin care, and therapeutic exercise.   Pilar Plate discussion of high burden of care and contributing impact of existing co morbidities. We discussed  Importance of daily, ongoing LE self-care essential to retaining clinical gains and limiting progression. Provided printed Lymphedema Workbook for reference. Pt verbalized understanding that assistance with daily gradient compression wrapping and LE skin care throughout Intensive CDT is essential due to her inability to reach distal l legs and feet to perform these critical self-care ADLs herself. Without daily CG assistance throughout Intensive Phase CDT prognosis for improvement in condition and functional performance is guarded, at best.    Person(s) Educated  Patient    Methods Explanation;Demonstration;Handout    Comprehension Verbalized understanding;Returned demonstration;Need  further instruction               OT Long Term Goals - 12/13/20 1301      OT LONG TERM GOAL #1   Title Pt will be able to apply knee length, multi-layer, short stretch compression wraps to one leg using correct gradient techniques with Max caregiver assistance to decrease limb volume, to decrease leg pain and limit infection risk, and to improve safe functional ambulation and mobility.    Baseline dependent    Time 4    Period Days    Status New    Target Date --   4th OT Rx visit     OT LONG TERM GOAL #2   Title Pt will demonstrate understanding of lymphedema precautions  by being able to name and offer examples of 6 strategies using a printed reference for PRN.    Baseline Max A    Time 4    Period Days    Status New    Target Date --   4th OT Rx visit     OT LONG TERM GOAL #3   Title Pt will achieve and sustain a least 85% compliance performing all daily LE self-care home program components throughout Intensive Phase CDT, including elevation when seated, recommended skin care regime, lymphatic pumping ther ex, 23/7 compression wraps and simple self-MLD, with Max caregiver assistance to ensure optimal limb volume reduction, to limit infection risk and to limit LE progression.    Baseline dependent    Time 4    Period Weeks    Status New    Target Date 01/10/21      OT LONG TERM GOAL #4   Title Pt will achieve at least 10%  limb volume reductions bilaterally during Intensive Phase CDT to prevent re-accumulation of lymphatic congestion and progression of fibrosis, to limit infection risk, to improve functional ambulation and transfers, and  to improve functional performance of basic and instrumental ADLs, and to limit LE progression.    Baseline dependent    Time 12    Period Weeks    Status New    Target Date 03/11/21      OT LONG TERM GOAL #5    Title By DC from OT Pt will be able to don and doff appropriate daytime compression garments and HOS devices with Max CG assistance to limit lymphatic re-accumulation and LE progression with before transitioning to self-management phase of CDT.    Baseline dependent    Time 12    Period Weeks    Status New    Target Date 03/11/21                 Plan - 12/13/20 1238    Clinical Impression Statement Lisa [name] presents with severe, stage II - mild, stage III (elephantiasis) of   the BLE . Despite onset pattern in early adulthood being in keeping with onset pattern of primary lymphedema tarda, Ms. Petithomme' presentation appears uncharacteristic of hereditary lymphedema of > than 50 years. It is difficult to nail down the etiology of lymphedema in this case, but I suspect secondary causes causes include chronic systemic overload and obesity. Legs are mildly redened below the knees , however skin temperature is warm today, which is indicative of inflammation. Chronic inflammatory response in the skin and tissues due to chronic myologenis leukemia may also be a contributing factor of an overactive immune response, possible lymphatic overload and possibly vasculitis. BLE lymphedema limits Pt's ability to perform  functional tasks and activities in all occupational domains, including basic and instrumental ADLs, productive activities and leisure pursuits, and social participation. It also has negative impat on body image and mood. Lisa [name] will benefit from skilled Occupational Therapy to reduce limb swelling, improve skin condition, limit infection risk and limit lymphedema progression Without OT for CDT, lymphedema will continue to progress and Pt will experience  further functional decline. Pt verbalized understanding that lymphedema treatment protocol, CDT, requires diligent self-care throughout both the intensive and sef-management phases. Because Pt is unable to reach her distal legs and feet to  perform skin care and re apply compression wraps daily, prognosis is guarded, at best, without daily caregiver assistance. With caregiver assistance, prognosis for improvement is good.    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;Decreased knowledge of use of DME;Balance;Pain;Edema;ROM;IADL;Endurance;Scar mobility;Mobility;Decreased knowledge of precautions;Skin integrity    Rehab Potential Good    Clinical Decision Making Several treatment options, min-mod task modification necessary    Comorbidities Affecting Occupational Performance: Presence of comorbidities impacting occupational performance    Comorbidities impacting occupational performance description: see Subjective    Modification or Assistance to Complete Evaluation  Min-Moderate modification of tasks or assist with assess necessary to complete eval    OT Frequency 3x / week    OT Duration 12 weeks   and ongoing PRN for management phase CDT support   OT Treatment/Interventions Self-care/ADL training;DME and/or AE instruction;Manual lymph drainage;Therapeutic activities;Compression bandaging;Therapeutic exercise;Coping strategies training;Other (comment);Functional Mobility Training;Manual Therapy;Patient/family education   skin care with low ph castor oil during MLD   Plan Intensive and Self-Management Phase Complete Decongestive Therapy (CDT): Treating one leg at a time below the knee to limit falls risk w/ Manual lymphatic drainage (MLD), skin care, compression bandages and fitting with appropriate compression garments  when volume reductions achieved, and lymphatic pumping ther ex     Recommended Other Services Pt will benefit from PT to improve balance, increase LB strength, increase endurance , and improve functional ambulation and mobility. Screening request submitted 12/11/20. Sequential pneumatic compression device contraindicated for this patient due to extensive cardiac precautions.    Consulted and Agree with Plan of Care Patient           Patient will benefit from skilled therapeutic intervention in order to improve the following deficits and impairments:   Body Structure / Function / Physical Skills: ADL,Decreased knowledge of use of DME,Balance,Pain,Edema,ROM,IADL,Endurance,Scar mobility,Mobility,Decreased knowledge of precautions,Skin integrity       Visit Diagnosis: Lymphedema - Plan: Ot plan of care cert/re-cert    Problem List Patient Active Problem List   Diagnosis Date Noted  . Sustained VT (ventricular tachycardia) ([place]) 09/21/2019  . 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 Acute bronchitis 11/02/2016  . Unstable angina ([place]) 10/07/2015  . Chronic systolic heart failure ([place])   . PVC's (premature ventricular contractions)   . Ventricular fibrillation ([place]) 10/03/2015  . Cardiac arrest ([place]) 10/03/2015  . Essential hypertension 09/09/2015  . Lymphedema 02/21/2015  . Other specified hypothyroidism 02/21/2015   Andrey Spearman, MS, OTR/L, [facility] 12/13/20 1:19 PM  [place] MAIN [facility] SERVICES [address] [place], [place], [address] Phone: [phone]   Fax:  [phone]  Name: Lisa [name]  [place] MRN: [id number] Date of Birth: 11-14-1940

## 2020-12-16 ENCOUNTER — Other Ambulatory Visit: Payer: Self-pay

## 2020-12-16 ENCOUNTER — Ambulatory Visit: Payer: Medicare Other | Admitting: Occupational Therapy

## 2020-12-16 DIAGNOSIS — I890 Lymphedema, not elsewhere classified: Secondary | ICD-10-CM

## 2020-12-17 NOTE — Therapy (Signed)
[place] MAIN [facility] SERVICES [address] [place], [place], [address] Phone: [phone]   Fax:  [phone]  Occupational Therapy Treatment  Patient Details  Name: Lisa [name] MRN: [id number] Date of Birth: May 23, 1941 No data recorded  Encounter Date: 12/16/2020   OT End of Session - 12/16/20 1509    Visit Number 2    Number of Visits 36    Date for OT Re-Evaluation 03/13/21    OT Start Time 0200    OT Stop Time 0308    OT Time Calculation (min) 68 min    Activity Tolerance Patient tolerated treatment well;No increased pain;Other (comment)   Pt limited by impaired functional mobility and transfers, and by decreased LB muscle strenth   Behavior During Therapy [facility] for tasks assessed/performed           Past Medical History:  Diagnosis Date  . CAD in native artery 2017   stent to LAD  . Cardiac arrest ([place]) 09/2015  . CHF (congestive heart failure) ([place])   . CML (chronic myelocytic leukemia) ([place])   . Complication of anesthesia   . Hx: UTI (urinary tract infection)   . Hypertension   . Melanoma ([place])   . PONV (postoperative nausea and vomiting)   . PVC (premature ventricular contraction)    HISTORY OF  . Thyroid disease   . V tach [facility]     Past Surgical History:  Procedure Laterality Date  . ABDOMINAL HYSTERECTOMY    . APPENDECTOMY    . CARDIAC CATHETERIZATION N/A 10/03/2015   Procedure: Left Heart Cath and Coronary Angiography;  Surgeon: [name], MD;  Location: [place] CV LAB;  Service: Cardiovascular;  Laterality: N/A;  . CARDIAC CATHETERIZATION N/A 10/07/2015   Procedure: Coronary Stent Intervention;  Surgeon: [name], MD;  Location: [place] CV LAB;  Service: Cardiovascular;  Laterality: N/A;  . CARDIAC CATHETERIZATION  09/22/2019  . EP IMPLANTABLE DEVICE N/A 01/20/2016   Procedure: BiV ICD Insertion CRT-D;  Surgeon: [name], MD;  Location: [place] CV LAB;  Service:  Cardiovascular;  Laterality: N/A;  . LEFT HEART CATH AND CORONARY ANGIOGRAPHY N/A 09/22/2019   Procedure: LEFT HEART CATH AND CORONARY ANGIOGRAPHY;  Surgeon: Martinique, Peter M, MD;  Location: [place] CV LAB;  Service: Cardiovascular;  Laterality: N/A;  . TONSILLECTOMY    . TONSILLECTOMY    . TUBAL LIGATION      There were no vitals filed for this visit.   Subjective Assessment - 12/17/20 1511    Subjective  Lisa [name] presents for OT Rx visit 1/36 to commence CDT to BLE for severe stage II-mild stagE III lymphedema. Pt presents for unscheduled visit, which she states she know she did not cancel. Scheduling notes reveal cancellation. In any case, Pt brings Lisa [name] alternative , knee length, Velcro style compression for assessmet. Pt tells me she no longer wears these garment alternatives because her legs have become too large. Pt presenting with moderate lymphorrhea of the LLE, which Pt has applied multiple sanitary napkins to with white paper tape circumferentially. The RLE presents with sliht lymphorrhea, so we commenced with comparative limb volumetrics and compression wrapping to that limb initially today. Pt does not rate LE-related leg pain numerically today. It is unchangedfrom initial eval several days ago.    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/P3, Chronic systolic congestive heart failure, severe, stage II-Mild stage III BLE lymphedema ( onset in 20's), hypothyroidism, HTN, Ventricular fibrillation, pacer regularly  interrogated, hx skin breakdown on leg, chronic myologeneous leukemia, Hx R leg melanoma w/ wide excision, s/p cardiac arrest 2017    Limitations chronic leg swelling and pain, decreased balance, decreased A/PROM , decreased lower body strength (unable to lift legs), impaired endurance    Repetition Increases Symptoms    Special Tests +Stemmer bilaterally base of toes; Intake FOTO TBA    Patient Stated Goals reduce leg swelling, improve functional mobility and safe  ambulation, improve skin condition, have better systemic fluid balance,    Pain Onset More than a month ago               LYMPHEDEMA/ONCOLOGY QUESTIONNAIRE - 12/17/20 1521      Lymphedema Assessments   Lymphedema Assessments Lower extremities      Right Lower Extremity Lymphedema   Other Initial RLE A-D limb volume = 11, 655.8 ml.                   OT Treatments/Exercises (OP) - 12/17/20 1517      ADLs   ADL Education Given Yes      Manual Therapy   Manual Therapy Edema management;Compression Bandaging    Edema Management RLE comparative limb volumetrics    Compression Bandaging Knee length, multi layer compression wrap to RLE below the knee (ankle to tibial tuberosity/ A-D) utilizing 8, 10 and 12 cm wide wraps x 2 over single layer of cotton stockinett and 0.4 cm thick Rosidal foam.Pt instructed to remove wraps after 24 hours, if she is able to tolerate that long.                  OT Education - 12/17/20 1523    Education Details Continued skilled Pt/caregiver education  And LE ADL training throughout visit for lymphedema self care/ home program, including compression wrapping, compression garment and device wear/care, lymphatic pumping ther ex, simple self-MLD, and skin care. Discussed initial volumetric measurements. Discussed progress towards goals.    Person(s) Educated Patient    Methods Explanation;Demonstration;Handout    Comprehension Verbalized understanding;Returned demonstration;Need further instruction               OT Long Term Goals - 12/13/20 1301      OT LONG TERM GOAL #1   Title Pt will be able to apply knee length, multi-layer, short stretch compression wraps to one leg using correct gradient techniques with Max caregiver assistance to decrease limb volume, to decrease leg pain and limit infection risk, and to improve safe functional ambulation and mobility.    Baseline dependent    Time 4    Period Days    Status New    Target  Date --   4th OT Rx visit     OT LONG TERM GOAL #2   Title Pt will demonstrate understanding of lymphedema precautions  by being able to name and offer examples of 6 strategies using a printed reference for PRN.    Baseline Max A    Time 4    Period Days    Status New    Target Date --   4th OT Rx visit     OT LONG TERM GOAL #3   Title Pt will achieve and sustain a least 85% compliance performing all daily LE self-care home program components throughout Intensive Phase CDT, including elevation when seated, recommended skin care regime, lymphatic pumping ther ex, 23/7 compression wraps and simple self-MLD, with Max caregiver assistance to ensure optimal limb volume reduction, to limit  infection risk and to limit LE progression.    Baseline dependent    Time 4    Period Weeks    Status New    Target Date 01/10/21      OT LONG TERM GOAL #4   Title Pt will achieve at least 10%  limb volume reductions bilaterally during Intensive Phase CDT to prevent re-accumulation of lymphatic congestion and progression of fibrosis, to limit infection risk, to improve functional ambulation and transfers, and  to improve functional performance of basic and instrumental ADLs, and to limit LE progression.    Baseline dependent    Time 12    Period Weeks    Status New    Target Date 03/11/21      OT LONG TERM GOAL #5   Title By DC from OT Pt will be able to don and doff appropriate daytime compression garments and HOS devices with Max CG assistance to limit lymphatic re-accumulation and LE progression with before transitioning to self-management phase of CDT.    Baseline dependent    Time 12    Period Weeks    Status New    Target Date 03/11/21                 Plan - 12/17/20 1525    Clinical Impression Statement Pt presents for day 1 of CDT. Due to time constraints as Pt arrived for unscheduled session, volumetric measurements for RLE were taken only. Comparative measurements of RLE reveal  intake limb volume below the knee measures 11,655.8 ml. Intake score on FOTO functional outcome tool  measures 48/100. Applied knee length, multi-layer, short stretch compression wraps using gradient techniques to RLE excluding foot due to impaired mobility and falls risk. Pt agrees to try to leave these in place for 24 hours , if possible. She will remove them if SOB at rest, or pain arise. Complete LLE volumetrics and attempt compression wraps next visit. Pt agrees to bring caregiver to learn techniques next session as per POC.    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;Decreased knowledge of use of DME;Balance;Pain;Edema;ROM;IADL;Endurance;Scar mobility;Mobility;Decreased knowledge of precautions;Skin integrity    Rehab Potential Good    Clinical Decision Making Several treatment options, min-mod task modification necessary    Comorbidities Affecting Occupational Performance: Presence of comorbidities impacting occupational performance    Comorbidities impacting occupational performance description: see Subjective    Modification or Assistance to Complete Evaluation  Min-Moderate modification of tasks or assist with assess necessary to complete eval    OT Frequency 3x / week    OT Duration 12 weeks   and ongoing PRN for management phase CDT support   OT Treatment/Interventions Self-care/ADL training;DME and/or AE instruction;Manual lymph drainage;Therapeutic activities;Compression bandaging;Therapeutic exercise;Coping strategies training;Other (comment);Functional Mobility Training;Manual Therapy;Patient/family education   skin care with low ph  castor oil during MLD   Plan Intensive and Self-Management Phase Complete Decongestive Therapy (CDT): Treating one leg at a time below the knee to limit falls risk w/ Manual lymphatic drainage (MLD), skin care, compression bandages and fitting with appropriate compression garments  when volume reductions achieved, and lymphatic pumping ther ex    Recommended Other Services Pt will benefit from PT to improve balance, increase LB strength, increase endurance ,  and improve functional ambulation and mobility. Screening request submitted 12/11/20. Sequential pneumatic compression device contraindicated for this patient due to extensive cardiac precautions.    Consulted and Agree with Plan of Care Patient           Patient will benefit from skilled therapeutic intervention in order to improve the following deficits and impairments:   Body Structure / Function / Physical Skills: ADL,Decreased knowledge of use of DME,Balance,Pain,Edema,ROM,IADL,Endurance,Scar mobility,Mobility,Decreased knowledge of precautions,Skin integrity       Visit Diagnosis: Lymphedema    Problem List Patient Active Problem List   Diagnosis Date Noted  . Sustained VT (ventricular tachycardia) ([place]) 09/21/2019  . 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 Acute bronchitis 11/02/2016  . Unstable angina ([place]) 10/07/2015  . Chronic systolic heart failure ([place])   . PVC's (premature ventricular contractions)   . Ventricular fibrillation ([place]) 10/03/2015  . Cardiac arrest ([place]) 10/03/2015  . Essential hypertension 09/09/2015  . Lymphedema 02/21/2015  . Other specified hypothyroidism 02/21/2015    Andrey Spearman, MS, OTR/L, [facility] 12/17/20 3:34 PM  [place] MAIN [facility] SERVICES [address] [place], [place], [address] Phone: [phone]   Fax:  [phone]  Name: Lisa [name] MRN: [id number] Date of Birth: 09-09-1940

## 2020-12-18 ENCOUNTER — Ambulatory Visit: Payer: Medicare Other | Admitting: Occupational Therapy

## 2020-12-19 ENCOUNTER — Ambulatory Visit: Payer: Medicare Other | Admitting: Occupational Therapy

## 2020-12-19 ENCOUNTER — Other Ambulatory Visit: Payer: Self-pay

## 2020-12-19 DIAGNOSIS — I890 Lymphedema, not elsewhere classified: Secondary | ICD-10-CM | POA: Diagnosis not present

## 2020-12-20 ENCOUNTER — Ambulatory Visit: Payer: Medicare Other | Admitting: Occupational Therapy

## 2020-12-20 NOTE — Therapy (Signed)
[place] MAIN [facility] SERVICES [address] [place], [place], [address] Phone: [phone]   Fax:  [phone]  Occupational Therapy Treatment  Patient Details  Name: Lisa [name] MRN: [id number] Date of Birth: 09-Jul-1941 No data recorded  Encounter Date: 12/19/2020   OT End of Session - 12/19/20 1515    Visit Number 3    Number of Visits 36    Date for OT Re-Evaluation 03/13/21    OT Start Time 0310    OT Stop Time 0410    OT Time Calculation (min) 60 min    Activity Tolerance Patient tolerated treatment well;No increased pain;Other (comment)   Pt limited by impaired functional mobility and transfers, and by decreased LB muscle strenth   Behavior During Therapy [facility] for tasks assessed/performed           Past Medical History:  Diagnosis Date  . CAD in native artery 2017   stent to LAD  . Cardiac arrest ([place]) 09/2015  . CHF (congestive heart failure) ([place])   . CML (chronic myelocytic leukemia) ([place])   . Complication of anesthesia   . Hx: UTI (urinary tract infection)   . Hypertension   . Melanoma ([place])   . PONV (postoperative nausea and vomiting)   . PVC (premature ventricular contraction)    HISTORY OF  . Thyroid disease   . V tach [facility])     Past Surgical History:  Procedure Laterality Date  . ABDOMINAL HYSTERECTOMY    . APPENDECTOMY    . CARDIAC CATHETERIZATION N/A 10/03/2015   Procedure: Left Heart Cath and Coronary Angiography;  Surgeon: [name], MD;  Location: [place] CV LAB;  Service: Cardiovascular;  Laterality: N/A;  . CARDIAC CATHETERIZATION N/A 10/07/2015   Procedure: Coronary Stent Intervention;  Surgeon: [name], MD;  Location: [place] CV LAB;  Service: Cardiovascular;  Laterality: N/A;  . CARDIAC CATHETERIZATION  09/22/2019  . EP IMPLANTABLE DEVICE N/A 01/20/2016   Procedure: BiV ICD Insertion CRT-D;  Surgeon: [name], MD;  Location: [place] CV LAB;  Service:  Cardiovascular;  Laterality: N/A;  . LEFT HEART CATH AND CORONARY ANGIOGRAPHY N/A 09/22/2019   Procedure: LEFT HEART CATH AND CORONARY ANGIOGRAPHY;  Surgeon: Martinique, Peter M, MD;  Location: [place] CV LAB;  Service: Cardiovascular;  Laterality: N/A;  . TONSILLECTOMY    . TONSILLECTOMY    . TUBAL LIGATION      There were no vitals filed for this visit.   Subjective Assessment - 12/20/20 6237    Subjective  Lisa [name] presents for OT Rx visit 3/36  to address  BLE for severe stage II-mild stagE III lymphedema. Lisa [name] reports she was able to tolerate compression wraps to RLE after last session without pain. Skin below the knees is weeping profusely, and RLE is reddened and irritated cicumferentially. Pt reports that she has not devised a concrete plan for assistance with lymphedema wrapping between visits yet. We discussed this essential part of the treatment plan. She verbalized understanding that without aistance with compressiorapping daily between visits prognosis for improvement in lymphedema is guarded at best.She agrees to work on this plan over the weekend and we'll discuss it again next week.    Pertinent History CKD, Stage S2G/B1, Chronic systolic congestive heart failure, severe, stage II-Mild stage III BLE lymphedema ( onset in 20's), hypothyroidism, HTN, Ventricular fibrillation, pacer regularly interrogated, hx skin breakdown on leg, chronic myologeneous leukemia, Hx R leg melanoma w/ wide excision,  s/p cardiac arrest 2017    Limitations chronic leg swelling and pain, decreased balance, decreased A/PROM , decreased lower body strength (unable to lift legs), impaired endurance    Repetition Increases Symptoms    Special Tests +Stemmer bilaterally base of toes; Intake FOTO TBA    Patient Stated Goals reduce leg swelling, improve functional mobility and safe ambulation, improve skin condition, have better systemic fluid balance,    Pain Onset More than a month ago                LYMPHEDEMA/ONCOLOGY QUESTIONNAIRE - 12/20/20 0830      Lymphedema Assessments   Lymphedema Assessments Lower extremities      Right Lower Extremity Lymphedema   Other Initial RLE A-D limb volume = 11, 655.8 ml.      Left Lower Extremity Lymphedema   Other Initial LLE comaprative limb voluetrics reveal Leg volume m easures ml.                   OT Treatments/Exercises (OP) - 12/20/20 0347      ADLs   ADL Education Given Yes      Manual Therapy   Manual Therapy Edema management;Compression Bandaging    Edema Management LLE comparative limb volumetrics    Compression Bandaging Knee length, multi layer compression wrap to LLE below the knee (ankle to tibial tuberosity/ A-D) utilizing 8, 10 and 12 cm wide wraps x 3 over single layer of cotton stockinett and 0.4 cm thick Rosidal foam.Pt instructed to remove wraps after 24 hours, if she is able to tolerate that long.                  OT Education - 12/20/20 0834    Education Details Reviewed bandagpree cautions and essential plan for bandaging assistance between visits. Discussed rational and prognosis .    Person(s) Educated Patient    Methods Explanation;Demonstration;Handout    Comprehension Verbalized understanding;Need further instruction               OT Long Term Goals - 12/13/20 1301      OT LONG TERM GOAL #1   Title Pt will be able to apply knee length, multi-layer, short stretch compression wraps to one leg using correct gradient techniques with Max caregiver assistance to decrease limb volume, to decrease leg pain and limit infection risk, and to improve safe functional ambulation and mobility.    Baseline dependent    Time 4    Period Days    Status New    Target Date --   4th OT Rx visit     OT LONG TERM GOAL #2   Title Pt will demonstrate understanding of lymphedema precautions  by being able to name and offer examples of 6 strategies using a printed reference for PRN.    Baseline  Max A    Time 4    Period Days    Status New    Target Date --   4th OT Rx visit     OT LONG TERM GOAL #3   Title Pt will achieve and sustain a least 85% compliance performing all daily LE self-care home program components throughout Intensive Phase CDT, including elevation when seated, recommended skin care regime, lymphatic pumping ther ex, 23/7 compression wraps and simple self-MLD, with Max caregiver assistance to ensure optimal limb volume reduction, to limit infection risk and to limit LE progression.    Baseline dependent    Time 4  Period Weeks    Status New    Target Date 01/10/21      OT LONG TERM GOAL #4   Title Pt will achieve at least 10%  limb volume reductions bilaterally during Intensive Phase CDT to prevent re-accumulation of lymphatic congestion and progression of fibrosis, to limit infection risk, to improve functional ambulation and transfers, and  to improve functional performance of basic and instrumental ADLs, and to limit LE progression.    Baseline dependent    Time 12    Period Weeks    Status New    Target Date 03/11/21      OT LONG TERM GOAL #5   Title By DC from OT Pt will be able to don and doff appropriate daytime compression garments and HOS devices with Max CG assistance to limit lymphatic re-accumulation and LE progression with before transitioning to self-management phase of CDT.    Baseline dependent    Time 12    Period Weeks    Status New    Target Date 03/11/21                 Plan - 12/19/20 1522    Clinical Impression Statement Pt presents for day 1 of CDT. Due to time constraints as Pt arrived for unscheduled session, volumetric measurements for RLE were taken only. Comparative measurements of RLE reveal intake limb volume below the knee measures 11,655.8 ml. Intake score on FOTO functional outcome tool  measures 48/100. Applied knee length, multi-layer, short stretch compression wraps using gradient techniques to RLE excluding foot  due to impaired mobility and falls risk. Pt agrees to try to leave these in place for 24 hours , if possible. She will remove them if SOB at rest, or pain arise. Complete LLE volumetrics and attempt compression wraps next visit. Pt agrees to bring caregiver to learn techniques next session as per POC.    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;Decreased knowledge of use of DME;Balance;Pain;Edema;ROM;IADL;Endurance;Scar mobility;Mobility;Decreased knowledge of precautions;Skin integrity    Rehab Potential Good    Clinical Decision Making Several treatment options, min-mod task modification necessary    Comorbidities Affecting Occupational Performance: Presence of comorbidities impacting occupational performance    Comorbidities impacting occupational performance description: see Subjective    Modification or Assistance to Complete Evaluation  Min-Moderate modification of tasks or assist with assess necessary to complete eval    OT Frequency 3x / week    OT Duration 12 weeks   and ongoing PRN for management phase CDT support   OT Treatment/Interventions Self-care/ADL training;DME and/or AE instruction;Manual lymph drainage;Therapeutic activities;Compression bandaging;Therapeutic exercise;Coping strategies training;Other (comment);Functional Mobility Training;Manual Therapy;Patient/family education   skin care with low ph castor oil during MLD   Plan Intensive and Self-Management Phase Complete Decongestive Therapy (CDT): Treating one leg at a time below the knee to limit falls risk w/ Manual lymphatic drainage (MLD), skin care, compression bandages  and fitting with appropriate compression garments  when volume reductions achieved, and lymphatic pumping ther ex    Recommended Other Services Pt will benefit from PT to improve balance, increase LB strength, increase endurance , and improve functional ambulation and mobility. Screening request submitted 12/11/20. Sequential pneumatic compression device contraindicated for this patient due  to extensive cardiac precautions.    Consulted and Agree with Plan of Care Patient           Patient will benefit from skilled therapeutic intervention in order to improve the following deficits and impairments:   Body Structure / Function / Physical Skills: ADL,Decreased knowledge of use of DME,Balance,Pain,Edema,ROM,IADL,Endurance,Scar mobility,Mobility,Decreased knowledge of precautions,Skin integrity       Visit Diagnosis: Lymphedema    Problem List Patient Active Problem List   Diagnosis Date Noted  . Sustained VT (ventricular tachycardia) ([place]) 09/21/2019  . 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 Acute bronchitis 11/02/2016  . Unstable angina ([place]) 10/07/2015  . Chronic systolic heart failure ([place])   . PVC's (premature ventricular contractions)   . Ventricular fibrillation ([place]) 10/03/2015  . Cardiac arrest ([place]) 10/03/2015  . Essential hypertension 09/09/2015  . Lymphedema 02/21/2015  . Other specified hypothyroidism 02/21/2015    Andrey Spearman, Lisa, OTR/L, [facility] 12/20/20 8:37 AM   [place] MAIN [facility] SERVICES [address] [place], [place], [address] Phone: [phone]   Fax:  [phone]  Name: Lisa [name] MRN: [id number] Date of Birth: [birth year]

## 2020-12-20 NOTE — Patient Instructions (Signed)
Notify physician immediately if signs/symptoms of cellulitis develop.  Remove LLE compression wraps after  24 hours, or as tolerated.\or SOB at rest.  Remove wraps and call your doctor  if symptoms of infection develpe, if you experience acute pain, an increase in swelling and / or atypical SOB, especially at rest.

## 2020-12-23 ENCOUNTER — Ambulatory Visit: Payer: Medicare Other | Admitting: Occupational Therapy

## 2020-12-23 ENCOUNTER — Other Ambulatory Visit: Payer: Self-pay

## 2020-12-23 DIAGNOSIS — I890 Lymphedema, not elsewhere classified: Secondary | ICD-10-CM | POA: Diagnosis not present

## 2020-12-23 NOTE — Patient Instructions (Signed)
Pt agrees to present RLE skin irritation at distal leg and ankle to her -PCP  For examination on Weds when she sees her, and request referral to wound care clinic to treat skin breakdown.   Remove compression wraps q 24 hours. Inspect, bathe skin and re-apply wraps daily. Repot any signs/ symptoms of infection to referring provider.Sleeping in bandages is part of the protocol. Do not leave wraps off more than 1 hr before re-applying.  Remove compression wraps immediately and notify referring MD if you experience acute onset of leg pain, swelling and/ or atypical SOB, especially at rest.

## 2020-12-23 NOTE — Therapy (Signed)
[place] MAIN [facility] SERVICES [address] [place], [place], [address] Phone: [phone]   Fax:  [phone]  Occupational Therapy Treatment  Patient Details  Name: Lisa [name] MRN: [id number] Date of Birth: 10/11/1940 No data recorded  Encounter Date: 12/23/2020   OT End of Session - 12/23/20 0900    Visit Number 4    Number of Visits 36    Date for OT Re-Evaluation 03/13/21    OT Start Time 0900    OT Stop Time 1013    OT Time Calculation (min) 73 min    Activity Tolerance Patient tolerated treatment well;No increased pain;Other (comment)   Pt limited by impaired functional mobility and transfers, and by decreased LB muscle strenth   Behavior During Therapy [facility] for tasks assessed/performed           Past Medical History:  Diagnosis Date  . CAD in native artery 2017   stent to LAD  . Cardiac arrest ([place]) 09/2015  . CHF (congestive heart failure) ([place])   . CML (chronic myelocytic leukemia) ([place])   . Complication of anesthesia   . Hx: UTI (urinary tract infection)   . Hypertension   . Melanoma ([place])   . PONV (postoperative nausea and vomiting)   . PVC (premature ventricular contraction)    HISTORY OF  . Thyroid disease   . V tach [facility])     Past Surgical History:  Procedure Laterality Date  . ABDOMINAL HYSTERECTOMY    . APPENDECTOMY    . CARDIAC CATHETERIZATION N/A 10/03/2015   Procedure: Left Heart Cath and Coronary Angiography;  Surgeon: [name], MD;  Location: [place] CV LAB;  Service: Cardiovascular;  Laterality: N/A;  . CARDIAC CATHETERIZATION N/A 10/07/2015   Procedure: Coronary Stent Intervention;  Surgeon: [name], MD;  Location: [place] CV LAB;  Service: Cardiovascular;  Laterality: N/A;  . CARDIAC CATHETERIZATION  09/22/2019  . EP IMPLANTABLE DEVICE N/A 01/20/2016   Procedure: BiV ICD Insertion CRT-D;  Surgeon: [name], MD;  Location: [place] CV LAB;  Service:  Cardiovascular;  Laterality: N/A;  . LEFT HEART CATH AND CORONARY ANGIOGRAPHY N/A 09/22/2019   Procedure: LEFT HEART CATH AND CORONARY ANGIOGRAPHY;  Surgeon: Martinique, Peter M, MD;  Location: [place] CV LAB;  Service: Cardiovascular;  Laterality: N/A;  . TONSILLECTOMY    . TONSILLECTOMY    . TUBAL LIGATION      There were no vitals filed for this visit.   Subjective Assessment - 12/23/20 0907    Subjective  Ms. Babineau presents for OT visit 4/36 to address BLE lymphedema. She denies LE rewlated leg pain and states she feels both of her legs look better. She presents with sanitary napkins taped onto her distal legs and ankles bilaterally to absorb moderate lymphorrhea. Her socks and shoes are damp. Pt reports she was unable to find a caregiver who is available to help her with compression wraps between visits and on weekend as she is unable to reach her feet to wrap herself, to perform shin inspection and skin care.  Pt reports her grand daughter is unable to attend appointments to learn techniques. She would like to proceed with Intensive Phase CDT, despite decreased prognosis without assistance.    Pertinent History CKD, Stage Z1I/W5, Chronic systolic congestive heart failure, severe, stage II-Mild stage III BLE lymphedema ( onset in 20's), hypothyroidism, HTN, Ventricular fibrillation, pacer regularly interrogated, hx skin breakdown on leg, chronic myologeneous leukemia, Hx R  leg melanoma w/ wide excision, s/p cardiac arrest 2017    Limitations chronic leg swelling and pain, decreased balance, decreased A/PROM , decreased lower body strength (unable to lift legs), impaired endurance    Repetition Increases Symptoms    Special Tests +Stemmer bilaterally base of toes; Intake FOTO TBA    Patient Stated Goals reduce leg swelling, improve functional mobility and safe ambulation, improve skin condition, have better systemic fluid balance,    Currently in Pain? No/denies    Pain Onset More than a month  ago                        OT Treatments/Exercises (OP) - 12/23/20 1252      ADLs   ADL Education Given Yes      Manual Therapy   Manual Therapy Edema management;Compression Bandaging;Manual Lymphatic Drainage (MLD)    Manual Lymphatic Drainage (MLD) MLD to LLE/LLW using typical, non-cancer related sequences, including J strokes to terminus only ( no neck strokes in keeping w thyroid precautions), functional inguinal LN and J strokes to thigh first, then medial knee. No strokes below the knee due to lymphorrea and skin skin irritation.    Compression Bandaging Knee length, multi layer compression wrap to LLE below the knee (ankle to tibial tuberosity/ A-D) utilizing 8, 10 and 12 cm wide wraps x 3 over single layer of cotton stockinett and 0.4 cm thick Rosidal foam.Pt instructed to remove wraps after 24 hours, if she is able to tolerate that long.                  OT Education - 12/23/20 1314    Education Details Reviewed bandage pre cautions and essential plan for bandaging assistance between visits. Pt agrees to present RLE skin irritation at distal leg and ankle to her -PCP  For examination on Weds when she sees her, and request referral to wound care clinic to treat skin breakdown.     Remove compression wraps q 24 hours. Inspect, bathe skin and re-apply wraps daily. Report any signs/ symptoms of infection to referring provider.Sleeping in bandages is part of the protocol. Do not leave wraps off more than 1 hr before re-applying.    Remove compression wraps immediately and notify referring MD if you experience acute onset of leg pain, swelling and/ or atypical SOB, especially at rest.Discussed rational and  limited prognosis  since Pt has no one to assist her. Provided intro level edu for simple self MLD.    Person(s) Educated Patient    Methods Explanation;Demonstration;Handout    Comprehension Verbalized understanding;Need further instruction               OT  Long Term Goals - 12/13/20 1301      OT LONG TERM GOAL #1   Title Pt will be able to apply knee length, multi-layer, short stretch compression wraps to one leg using correct gradient techniques with Max caregiver assistance to decrease limb volume, to decrease leg pain and limit infection risk, and to improve safe functional ambulation and mobility.    Baseline dependent    Time 4    Period Days    Status New    Target Date --   4th OT Rx visit     OT LONG TERM GOAL #2   Title Pt will demonstrate understanding of lymphedema precautions  by being able to name and offer examples of 6 strategies using a printed reference for PRN.  Baseline Max A    Time 4    Period Days    Status New    Target Date --   4th OT Rx visit     OT LONG TERM GOAL #3   Title Pt will achieve and sustain a least 85% compliance performing all daily LE self-care home program components throughout Intensive Phase CDT, including elevation when seated, recommended skin care regime, lymphatic pumping ther ex, 23/7 compression wraps and simple self-MLD, with Max caregiver assistance to ensure optimal limb volume reduction, to limit infection risk and to limit LE progression.    Baseline dependent    Time 4    Period Weeks    Status New    Target Date 01/10/21      OT LONG TERM GOAL #4   Title Pt will achieve at least 10%  limb volume reductions bilaterally during Intensive Phase CDT to prevent re-accumulation of lymphatic congestion and progression of fibrosis, to limit infection risk, to improve functional ambulation and transfers, and  to improve functional performance of basic and instrumental ADLs, and to limit LE progression.    Baseline dependent    Time 12    Period Weeks    Status New    Target Date 03/11/21      OT LONG TERM GOAL #5   Title By DC from OT Pt will be able to don and doff appropriate daytime compression garments and HOS devices with Max CG assistance to limit lymphatic re-accumulation and LE  progression with before transitioning to self-management phase of CDT.    Baseline dependent    Time 12    Period Weeks    Status New    Target Date 03/11/21                 Plan - 12/23/20 1648    Clinical Impression Statement Sister Schlageter presents for OT Rx visit 4/36  to address  BLE for severe stage II-mild stagE III lymphedema. Ms Legrand Rams reports she was able to tolerate compression wraps to RLE after last session without pain; however skin at dista legs presents with visibly dripping lymphorrhea.Skin of distal R Leg and ankle is reddened, macerated and appears to be breaking down slightly. LE treatment is to one limb at a time to limit falls risk, and other than initial Rx day 1  we have no wrapped this leg LLE lymphorrhea was initially worse. Pt would bebnefit from wound care consult ASAP for RLE to address worsening skin condition  that may lead to infection unless treated. Despite frank discussion re essential need for caregiver assistance between visits Pt reports she has no one who can help her with compression bandaging between visits. Her granddaughter is unable to attend visits to learn wrapping technique. We will try modifoied plan for a couple of weeks in effort to support Pt's efforts as best we can, including allowing her to leave wraps in place during 1  day visit intervals, and she will use existing, velcro style alternative compression garments on weekend. If this is ineffective and progress stalls, or is skin condition  continues to worsen on Rx leg (LLE at present)we'll end this modified plan and notify referring physician in progress report. Pt states she believes both of her legs look better today, despite ongoing Bilateral redness , LLE breakdown and lymphorrhea. Routing this note to referring MD as prognosis without CG assistance is declined.    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 Pt is unable to reach feet to apply essential multi-layer compression wraps .   Clinical Decision Making Several treatment options, min-mod task modification necessary    Comorbidities Affecting Occupational Performance: Presence of comorbidities impacting occupational performance    Comorbidities impacting occupational performance description: see Subjective    Modification or Assistance to Complete Evaluation  Min-Moderate modification of tasks or assist with assess necessary to complete eval    OT Frequency 3x / week    OT Duration 12 weeks   and ongoing PRN for management phase CDT support   OT Treatment/Interventions Self-care/ADL training;DME and/or AE instruction;Manual lymph drainage;Therapeutic activities;Compression bandaging;Therapeutic exercise;Coping strategies training;Other (comment);Functional Mobility Training;Manual Therapy;Patient/family education   skin care with low ph castor oil during MLD   Plan Intensive and Self-Management Phase Complete Decongestive Therapy (CDT): Treating one leg at a time below the knee to limit falls risk w/ Manual lymphatic drainage (MLD), skin care, compression bandages and fitting with appropriate compression garments  when volume reductions achieved, and lymphatic pumping ther ex    Recommended Other Services Pt will benefit from PT to improve balance, increase LB strength, increase endurance , and improve functional  ambulation and mobility. Screening request submitted 12/11/20. Sequential pneumatic compression device contraindicated for this patient due to extensive cardiac precautions.    Consulted and Agree with Plan of Care Patient           Patient will benefit from skilled therapeutic intervention in order to improve the following deficits and impairments:   Body Structure / Function / Physical Skills: ADL,Decreased knowledge of use of DME,Balance,Pain,Edema,ROM,IADL,Endurance,Scar mobility,Mobility,Decreased knowledge of precautions,Skin integrity       Visit Diagnosis: Lymphedema    Problem List Patient Active Problem List   Diagnosis Date Noted  . Sustained VT (ventricular tachycardia) ([place]) 09/21/2019  . 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 Acute bronchitis 11/02/2016  . Unstable angina ([place]) 10/07/2015  . Chronic systolic heart failure ([place])   . PVC's (premature ventricular contractions)   . Ventricular fibrillation ([place]) 10/03/2015  . Cardiac arrest ([place]) 10/03/2015  . Essential hypertension 09/09/2015  . Lymphedema 02/21/2015  . Other specified hypothyroidism 02/21/2015    Andrey Spearman, MS, OTR/L, [facility] 12/23/20 5:11 PM   [place] MAIN [facility] SERVICES [address] [place], [place], [address] Phone: [phone]   Fax:  [phone]  Name: Lisa [name] MRN: [id number] Date of Birth: Oct 28, 1940

## 2020-12-24 MED ORDER — NITROGLYCERIN 0.4 MG SL SUBL: 0.4000 mg | SUBLINGUAL_TABLET | SUBLINGUAL | 3 refills | Status: DC | PRN

## 2020-12-25 ENCOUNTER — Ambulatory Visit: Payer: Medicare Other | Admitting: Occupational Therapy

## 2020-12-25 ENCOUNTER — Other Ambulatory Visit: Payer: Self-pay

## 2020-12-25 DIAGNOSIS — I890 Lymphedema, not elsewhere classified: Secondary | ICD-10-CM | POA: Diagnosis not present

## 2020-12-25 NOTE — Therapy (Signed)
[place] MAIN [facility] SERVICES [address] [place], [place], [address] Phone: [phone]   Fax:  [phone]  Occupational Therapy Treatment  Patient Details  Name: Lisa [name] MRN: [id number] Date of Birth: 1941-01-21 No data recorded  Encounter Date: 12/25/2020   OT End of Session - 12/25/20 0810    Visit Number 5    Number of Visits 36    Date for OT Re-Evaluation 03/13/21    OT Start Time 0801    OT Stop Time 0908    OT Time Calculation (min) 67 min    Activity Tolerance Patient tolerated treatment well;No increased pain;Other (comment)   Pt limited by impaired functional mobility and transfers, and by decreased LB muscle strenth   Behavior During Therapy [facility] for tasks assessed/performed           Past Medical History:  Diagnosis Date  . CAD in native artery 2017   stent to LAD  . Cardiac arrest ([place]) 09/2015  . CHF (congestive heart failure) ([place])   . CML (chronic myelocytic leukemia) ([place])   . Complication of anesthesia   . Hx: UTI (urinary tract infection)   . Hypertension   . Melanoma ([place])   . PONV (postoperative nausea and vomiting)   . PVC (premature ventricular contraction)    HISTORY OF  . Thyroid disease   . V tach [facility])     Past Surgical History:  Procedure Laterality Date  . ABDOMINAL HYSTERECTOMY    . APPENDECTOMY    . CARDIAC CATHETERIZATION N/A 10/03/2015   Procedure: Left Heart Cath and Coronary Angiography;  Surgeon: [name], MD;  Location: [place] CV LAB;  Service: Cardiovascular;  Laterality: N/A;  . CARDIAC CATHETERIZATION N/A 10/07/2015   Procedure: Coronary Stent Intervention;  Surgeon: [name], MD;  Location: [place] CV LAB;  Service: Cardiovascular;  Laterality: N/A;  . CARDIAC CATHETERIZATION  09/22/2019  . EP IMPLANTABLE DEVICE N/A 01/20/2016   Procedure: BiV ICD Insertion CRT-D;  Surgeon: [name], MD;  Location: [place] CV LAB;  Service:  Cardiovascular;  Laterality: N/A;  . LEFT HEART CATH AND CORONARY ANGIOGRAPHY N/A 09/22/2019   Procedure: LEFT HEART CATH AND CORONARY ANGIOGRAPHY;  Surgeon: Martinique, Peter M, MD;  Location: [place] CV LAB;  Service: Cardiovascular;  Laterality: N/A;  . TONSILLECTOMY    . TONSILLECTOMY    . TUBAL LIGATION      There were no vitals filed for this visit.   Subjective Assessment - 12/25/20 2683    Subjective  Lisa [name] presents for OT Rx visit 5/36  to address  BLE for severe stage II-mild stagE III lymphedema. Lisa [name] presents without compression wraps in place. She has sanitary pads taped to distal legs bilaterally. She reports 24 hr tolerance for compression wraps to LLE after last session. Pt denies LE-related pain.    Pertinent History CKD, Stage M1D/Q2, Chronic systolic congestive heart failure, severe, stage II-Mild stage III BLE lymphedema ( onset in 20's), hypothyroidism, HTN, Ventricular fibrillation, pacer regularly interrogated, hx skin breakdown on leg, chronic myologeneous leukemia, Hx R leg melanoma w/ wide excision, s/p cardiac arrest 2017    Limitations chronic leg swelling and pain, decreased balance, decreased A/PROM , decreased lower body strength (unable to lift legs), impaired endurance    Repetition Increases Symptoms    Special Tests +Stemmer bilaterally base of toes; Intake FOTO TBA    Patient Stated Goals reduce leg swelling, improve functional mobility and  safe ambulation, improve skin condition, have better systemic fluid balance,    Pain Onset More than a month ago                        OT Treatments/Exercises (OP) - 12/25/20 1242      ADLs   ADL Education Given Yes      Manual Therapy   Manual Therapy Edema management;Manual Lymphatic Drainage (MLD);Compression Bandaging    Manual Lymphatic Drainage (MLD) MLD to LLE/LLW using typical, non-cancer related sequences, including J strokes to terminus only ( no neck strokes in keeping w  thyroid precautions), functional inguinal LN and J strokes to thigh first, then medial knee. No strokes below the knee due to lymphorrea and skin skin irritation.    Compression Bandaging Knee length, multi layer compression wrap to LLE below the knee (ankle to tibial tuberosity/ A-D) utilizing 8, 10 and 12 cm wide wraps x 3 over single layer of cotton stockinett and 0.4 cm thick Rosidal foam.Pt instructed to remove wraps after 24 hours, if she is able to tolerate that long.                  OT Education - 12/25/20 1235    Education Details Pt edu for lymphatic pumping ther ex.    Person(s) Educated Patient    Methods Explanation;Demonstration;Handout    Comprehension Verbalized understanding;Need further instruction               OT Long Term Goals - 12/13/20 1301      OT LONG TERM GOAL #1   Title Pt will be able to apply knee length, multi-layer, short stretch compression wraps to one leg using correct gradient techniques with Max caregiver assistance to decrease limb volume, to decrease leg pain and limit infection risk, and to improve safe functional ambulation and mobility.    Baseline dependent    Time 4    Period Days    Status New    Target Date --   4th OT Rx visit     OT LONG TERM GOAL #2   Title Pt will demonstrate understanding of lymphedema precautions  by being able to name and offer examples of 6 strategies using a printed reference for PRN.    Baseline Max A    Time 4    Period Days    Status New    Target Date --   4th OT Rx visit     OT LONG TERM GOAL #3   Title Pt will achieve and sustain a least 85% compliance performing all daily LE self-care home program components throughout Intensive Phase CDT, including elevation when seated, recommended skin care regime, lymphatic pumping ther ex, 23/7 compression wraps and simple self-MLD, with Max caregiver assistance to ensure optimal limb volume reduction, to limit infection risk and to limit LE progression.     Baseline dependent    Time 4    Period Weeks    Status New    Target Date 01/10/21      OT LONG TERM GOAL #4   Title Pt will achieve at least 10%  limb volume reductions bilaterally during Intensive Phase CDT to prevent re-accumulation of lymphatic congestion and progression of fibrosis, to limit infection risk, to improve functional ambulation and transfers, and  to improve functional performance of basic and instrumental ADLs, and to limit LE progression.    Baseline dependent    Time 12    Period Weeks  Status New    Target Date 03/11/21      OT LONG TERM GOAL #5   Title By DC from OT Pt will be able to don and doff appropriate daytime compression garments and HOS devices with Max CG assistance to limit lymphatic re-accumulation and LE progression with before transitioning to self-management phase of CDT.    Baseline dependent    Time 12    Period Weeks    Status New    Target Date 03/11/21                 Plan - 12/25/20 1238    Clinical Impression Statement LLE Lymphorrhea slightly decreased today, but still present. Did not remove covering on RLE to examine due to time constraints. LLE limb volume appears sligfhtly decreased since last visit when comparing it to very swollen RLE. Continued LLE/LLQ MLD as established and reapplied short stretch wraps in pattern established. Provided Pt edu for lymphatic pumping ther ex with good return. Cont as per POC.    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 Pt is unable to reach feet to apply essential multi-layer compression wraps .   Clinical Decision Making Several treatment options, min-mod task modification necessary    Comorbidities Affecting Occupational Performance: Presence of comorbidities impacting occupational performance    Comorbidities impacting occupational performance description: see Subjective    Modification or Assistance to Complete Evaluation  Min-Moderate modification of tasks or assist with assess necessary to complete eval    OT Frequency 3x / week    OT Duration 12 weeks   and ongoing PRN for management phase CDT support   OT Treatment/Interventions Self-care/ADL training;DME and/or AE instruction;Manual lymph drainage;Therapeutic activities;Compression bandaging;Therapeutic exercise;Coping strategies training;Other (comment);Functional Mobility Training;Manual Therapy;Patient/family education   skin care with low ph castor oil during MLD   Plan Intensive and Self-Management Phase Complete Decongestive Therapy (CDT): Treating one leg at a time below the knee to limit falls risk w/ Manual lymphatic drainage (MLD), skin care, compression bandages and fitting with appropriate compression garments  when volume reductions achieved, and lymphatic pumping ther ex    Recommended Other Services Pt will benefit from PT to improve balance, increase LB strength, increase endurance , and improve functional ambulation and mobility. Screening request submitted 12/11/20. Sequential pneumatic compression device contraindicated for this patient due to extensive cardiac precautions.    Consulted and Agree with Plan of Care Patient           Patient will benefit from skilled therapeutic intervention in order to improve the following deficits and impairments:   Body Structure / Function / Physical  Skills: ADL,Decreased knowledge of use of DME,Balance,Pain,Edema,ROM,IADL,Endurance,Scar mobility,Mobility,Decreased knowledge of precautions,Skin integrity       Visit Diagnosis: Lymphedema    Problem List Patient Active Problem List   Diagnosis Date Noted  . Sustained VT (ventricular tachycardia) ([place]) 09/21/2019  . 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 Acute bronchitis 11/02/2016  . Unstable angina ([place]) 10/07/2015  . Chronic systolic heart failure ([place])   .  PVC's (premature ventricular contractions)   . Ventricular fibrillation ([place]) 10/03/2015  . Cardiac arrest ([place]) 10/03/2015  . Essential hypertension 09/09/2015  . Lymphedema 02/21/2015  . Other specified hypothyroidism 02/21/2015    Andrey Spearman, MS, OTR/L, [facility] 12/25/20 12:49 PM  [place] MAIN [facility] SERVICES [address] [place], [place], [address] Phone: [phone]   Fax:  [phone]  Name: Lisa [name] MRN: [id number] Date of Birth: Sep 22, 1940

## 2020-12-27 ENCOUNTER — Other Ambulatory Visit: Payer: Self-pay

## 2020-12-27 ENCOUNTER — Ambulatory Visit: Payer: Medicare Other | Admitting: Occupational Therapy

## 2020-12-27 DIAGNOSIS — I890 Lymphedema, not elsewhere classified: Secondary | ICD-10-CM

## 2020-12-27 NOTE — Therapy (Signed)
[place] MAIN [facility] SERVICES [address] [place], [place], [address] Phone: [phone]   Fax:  [phone]  Occupational Therapy Treatment  Patient Details  Name: Lisa [name] MRN: [id number] Date of Birth: [birth year] No data recorded  Encounter Date: 12/27/2020   OT End of Session - 12/27/20 0811    Visit Number 6    Number of Visits 36    Date for OT Re-Evaluation 03/13/21    OT Start Time 0804    OT Stop Time 0904    OT Time Calculation (min) 60 min    Activity Tolerance Patient tolerated treatment well;No increased pain;Other (comment)   Pt limited by impaired functional mobility and transfers, and by decreased LB muscle strenth   Behavior During Therapy [facility] for tasks assessed/performed           Past Medical History:  Diagnosis Date  . CAD in native artery 2017   stent to LAD  . Cardiac arrest ([place]) 09/2015  . CHF (congestive heart failure) ([place])   . CML (chronic myelocytic leukemia) ([place])   . Complication of anesthesia   . Hx: UTI (urinary tract infection)   . Hypertension   . Melanoma ([place])   . PONV (postoperative nausea and vomiting)   . PVC (premature ventricular contraction)    HISTORY OF  . Thyroid disease   . V tach [facility])     Past Surgical History:  Procedure Laterality Date  . ABDOMINAL HYSTERECTOMY    . APPENDECTOMY    . CARDIAC CATHETERIZATION N/A 10/03/2015   Procedure: Left Heart Cath and Coronary Angiography;  Surgeon: [name], MD;  Location: [place] CV LAB;  Service: Cardiovascular;  Laterality: N/A;  . CARDIAC CATHETERIZATION N/A 10/07/2015   Procedure: Coronary Stent Intervention;  Surgeon: [name], MD;  Location: [place] CV LAB;  Service: Cardiovascular;  Laterality: N/A;  . CARDIAC CATHETERIZATION  09/22/2019  . EP IMPLANTABLE DEVICE N/A 01/20/2016   Procedure: BiV ICD Insertion CRT-D;  Surgeon: [name], MD;  Location: [place] CV LAB;  Service:  Cardiovascular;  Laterality: N/A;  . LEFT HEART CATH AND CORONARY ANGIOGRAPHY N/A 09/22/2019   Procedure: LEFT HEART CATH AND CORONARY ANGIOGRAPHY;  Surgeon: Martinique, Peter M, MD;  Location: [place] CV LAB;  Service: Cardiovascular;  Laterality: N/A;  . TONSILLECTOMY    . TONSILLECTOMY    . TUBAL LIGATION      There were no vitals filed for this visit.   Subjective Assessment - 12/27/20 2423    Subjective  Lisa [name] presents for OT Rx visit 6/36  to address  BLE for severe stage II-mild stagE III lymphedema. [name] presents without compression wraps in place. Lmphorrhea is observed bilaterally. Pt reports the sore on the back of her R leg hurts and she rates pain as 1/10.[name] She reports she removed wraps last night before bed. We discussed importance of compression remaining in place , even during HOS, to limit fluid and protien reaccumulation when muscles are at rest. Pt verbalized understanding, but reports wraps are damp from lymphorrhea and skin is stuck to stockinett.    Pertinent History CKD, Stage N3I/R4, Chronic systolic congestive heart failure, severe, stage II-Mild stage III BLE lymphedema ( onset in 20's), hypothyroidism, HTN, Ventricular fibrillation, pacer regularly interrogated, hx skin breakdown on leg, chronic myologeneous leukemia, Hx R leg melanoma w/ wide excision, s/p cardiac arrest 2017    Limitations chronic leg swelling and pain, decreased balance, decreased  A/PROM , decreased lower body strength (unable to lift legs), impaired endurance    Repetition Increases Symptoms    Special Tests +Stemmer bilaterally base of toes; Intake FOTO TBA    Patient Stated Goals reduce leg swelling, improve functional mobility and safe ambulation, improve skin condition, have better systemic fluid balance,    Pain Onset More than a month ago                        OT Treatments/Exercises (OP) - 12/27/20 1007      Manual Therapy   Manual Therapy Edema  management;Manual Lymphatic Drainage (MLD);Compression Bandaging    Manual Lymphatic Drainage (MLD) MLD to LLE/LLW using typical, non-cancer related sequences, including J strokes to terminus only ( no neck strokes in keeping w thyroid precautions), functional inguinal LN and J strokes to thigh first, then medial knee. No strokes below the knee due to lymphorrea and skin skin irritation.    Compression Bandaging No compression wrapas today  so PCP can examine skin . OT recommends wound consult  as protein-rich lymph fluid as draining break in the skin provides an entry point for bacteria resulting in infection, such as cellulitis, lymphangitis. Also,lymphorrhea is highly caustic to skin tand can develop into a large wound.                  OT Education - 12/27/20 1031    Education Details Continued skilled Pt/caregiver education  And LE ADL training throughout visit for lymphedema self care/ home program, including compression wrapping, compression garment and device wear/care, lymphatic pumping ther ex, simple self-MLD, and skin care. Discussed initial volumetric measurements. Discussed progress towards goals.    Person(s) Educated Patient    Methods Explanation;Demonstration;Handout    Comprehension Verbalized understanding;Returned demonstration;Need further instruction               OT Long Term Goals - 12/13/20 1301      OT LONG TERM GOAL #1   Title Pt will be able to apply knee length, multi-layer, short stretch compression wraps to one leg using correct gradient techniques with Max caregiver assistance to decrease limb volume, to decrease leg pain and limit infection risk, and to improve safe functional ambulation and mobility.    Baseline dependent    Time 4    Period Days    Status New    Target Date --   4th OT Rx visit     OT LONG TERM GOAL #2   Title Pt will demonstrate understanding of lymphedema precautions  by being able to name and offer examples of 6 strategies  using a printed reference for PRN.    Baseline Max A    Time 4    Period Days    Status New    Target Date --   4th OT Rx visit     OT LONG TERM GOAL #3   Title Pt will achieve and sustain a least 85% compliance performing all daily LE self-care home program components throughout Intensive Phase CDT, including elevation when seated, recommended skin care regime, lymphatic pumping ther ex, 23/7 compression wraps and simple self-MLD, with Max caregiver assistance to ensure optimal limb volume reduction, to limit infection risk and to limit LE progression.    Baseline dependent    Time 4    Period Weeks    Status New    Target Date 01/10/21      OT LONG TERM GOAL #4  Title Pt will achieve at least 10%  limb volume reductions bilaterally during Intensive Phase CDT to prevent re-accumulation of lymphatic congestion and progression of fibrosis, to limit infection risk, to improve functional ambulation and transfers, and  to improve functional performance of basic and instrumental ADLs, and to limit LE progression.    Baseline dependent    Time 12    Period Weeks    Status New    Target Date 03/11/21      OT LONG TERM GOAL #5   Title By DC from OT Pt will be able to don and doff appropriate daytime compression garments and HOS devices with Max CG assistance to limit lymphatic re-accumulation and LE progression with before transitioning to self-management phase of CDT.    Baseline dependent    Time 12    Period Weeks    Status New    Target Date 03/11/21                 Plan - 12/27/20 1016    Clinical Impression Statement Lymphorrhea is worsened today bilaterally with skin of ankles and distal legs, including deep skin creases and areas with skin-on-skin approximation,   present with redness, maceration and peeling outler layers. There is a dime sized sore observed on posterior R leg that is painful to light palpation. Skin is moist and shiney without odor.No compression wrapas  applied after LLE proximal MLD today  so PCP can examine skin . OT recommends wound consult ASAP to prevent further   breakdown of the skin and reduce the risk of cellulitis.   Appropriate dressing selection should be non-adherent and absorbent to be compatable with short stretch gradient compression wraps used for lymphedema treatment. Should Pt develop infection, she should not resume lymphedema treatment for 72 hours after commencing antibiotics.  Pt tolerated proximal MLD to LLE above the knee without increased pain. Manage visit frequency / duration based on outcome of wound consult.    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 Pt is unable to reach feet to apply essential multi-layer compression wraps .   Clinical Decision Making Several treatment options, min-mod task modification necessary    Comorbidities Affecting Occupational Performance: Presence of comorbidities impacting occupational performance    Comorbidities impacting occupational performance description: see Subjective    Modification or Assistance to Complete Evaluation  Min-Moderate modification of tasks or assist with assess necessary to complete eval    OT Frequency 3x / week    OT Duration 12 weeks   and ongoing PRN for management phase CDT support   OT Treatment/Interventions Self-care/ADL training;DME and/or  AE instruction;Manual lymph drainage;Therapeutic activities;Compression bandaging;Therapeutic exercise;Coping strategies training;Other (comment);Functional Mobility Training;Manual Therapy;Patient/family education   skin care with low ph castor oil during MLD   Plan Intensive and Self-Management Phase Complete Decongestive Therapy (CDT): Treating one leg at a time below the knee to limit falls risk w/ Manual lymphatic drainage (MLD), skin care, compression bandages and fitting with appropriate compression garments  when volume reductions achieved, and lymphatic pumping ther  ex    Recommended Other Services Pt will benefit from PT to improve balance, increase LB strength, increase endurance , and improve functional ambulation and mobility. Screening request submitted 12/11/20. Sequential pneumatic compression device contraindicated for this patient due to extensive cardiac precautions.    Consulted and Agree with Plan of Care Patient           Patient will benefit from skilled therapeutic intervention in order to improve the following deficits and impairments:   Body Structure / Function / Physical Skills: ADL,Decreased knowledge of use of DME,Balance,Pain,Edema,ROM,IADL,Endurance,Scar mobility,Mobility,Decreased knowledge of precautions,Skin integrity       Visit Diagnosis: Lymphedema    Problem List Patient Active Problem List   Diagnosis Date Noted  . Sustained VT (ventricular tachycardia) ([place]) 09/21/2019  . 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 Acute bronchitis 11/02/2016  . Unstable angina ([place]) 10/07/2015  . Chronic systolic heart failure ([place])   . PVC's (premature ventricular contractions)   . Ventricular fibrillation ([place]) 10/03/2015  . Cardiac arrest ([place]) 10/03/2015  . Essential hypertension 09/09/2015  . Lymphedema 02/21/2015  . Other specified hypothyroidism 02/21/2015    Andrey Spearman, MS, OTR/L, [facility] 12/27/20 10:32 AM  [place] MAIN [facility] SERVICES [address] [place], [place], [address] Phone: [phone]   Fax:  [phone]  Name: Lisa [name] MRN: [id number] Date of Birth: 21-Mar-1941

## 2021-01-01 ENCOUNTER — Other Ambulatory Visit: Payer: Self-pay

## 2021-01-01 ENCOUNTER — Ambulatory Visit: Payer: Medicare Other | Admitting: Occupational Therapy

## 2021-01-01 DIAGNOSIS — I890 Lymphedema, not elsewhere classified: Secondary | ICD-10-CM | POA: Diagnosis not present

## 2021-01-01 NOTE — Therapy (Signed)
[place] MAIN [facility] SERVICES [address] [place], [place], [address] Phone: [phone]   Fax:  [phone]  Occupational Therapy Treatment  Patient Details  Name: Lisa [name] MRN: [id number] Date of Birth: Dec 15, 1940 No data recorded  Encounter Date: 01/01/2021   OT End of Session - 01/01/21 0814    Visit Number 7    Number of Visits 36    Date for OT Re-Evaluation 03/13/21    OT Start Time 0800    OT Stop Time 0905    OT Time Calculation (min) 65 min    Activity Tolerance Patient tolerated treatment well;No increased pain;Other (comment)   Pt limited by impaired functional mobility and transfers, and by decreased LB muscle strenth   Behavior During Therapy [facility] for tasks assessed/performed           Past Medical History:  Diagnosis Date  . CAD in native artery 2017   stent to LAD  . Cardiac arrest ([place]) 09/2015  . CHF (congestive heart failure) ([place])   . CML (chronic myelocytic leukemia) ([place])   . Complication of anesthesia   . Hx: UTI (urinary tract infection)   . Hypertension   . Melanoma ([place])   . PONV (postoperative nausea and vomiting)   . PVC (premature ventricular contraction)    HISTORY OF  . Thyroid disease   . V tach [facility])     Past Surgical History:  Procedure Laterality Date  . ABDOMINAL HYSTERECTOMY    . APPENDECTOMY    . CARDIAC CATHETERIZATION N/A 10/03/2015   Procedure: Left Heart Cath and Coronary Angiography;  Surgeon: [name], MD;  Location: [place] CV LAB;  Service: Cardiovascular;  Laterality: N/A;  . CARDIAC CATHETERIZATION N/A 10/07/2015   Procedure: Coronary Stent Intervention;  Surgeon: [name], MD;  Location: [place] CV LAB;  Service: Cardiovascular;  Laterality: N/A;  . CARDIAC CATHETERIZATION  09/22/2019  . EP IMPLANTABLE DEVICE N/A 01/20/2016   Procedure: BiV ICD Insertion CRT-D;  Surgeon: [name], MD;  Location: [place] CV LAB;  Service:  Cardiovascular;  Laterality: N/A;  . LEFT HEART CATH AND CORONARY ANGIOGRAPHY N/A 09/22/2019   Procedure: LEFT HEART CATH AND CORONARY ANGIOGRAPHY;  Surgeon: Martinique, Peter M, MD;  Location: [place] CV LAB;  Service: Cardiovascular;  Laterality: N/A;  . TONSILLECTOMY    . TONSILLECTOMY    . TUBAL LIGATION      There were no vitals filed for this visit.   Subjective Assessment - 01/01/21 0815    Subjective  Lisa [name] presents for OT Rx visit 7/36  to address  BLE for severe stage II-mild stagE III lymphedema. Lisa [name] presents with co-Ban type compression wraps applied at PCP's offics yesterday from ankles extending to mis tibia . Pt denies LE related pain this morning. She reports she has a consult scheduled with the wound clinic near her home in early mid June. In the meantime she will visit her PCP's office for wraps  several x weekly. She requests OT for LE care until that wound care consult is completed.    Pertinent History CKD, Stage J8H/U3, Chronic systolic congestive heart failure, severe, stage II-Mild stage III BLE lymphedema ( onset in 20's), hypothyroidism, HTN, Ventricular fibrillation, pacer regularly interrogated, hx skin breakdown on leg, chronic myologeneous leukemia, Hx R leg melanoma w/ wide excision, s/p cardiac arrest 2017    Limitations chronic leg swelling and pain, decreased balance, decreased A/PROM , decreased lower body  strength (unable to lift legs), impaired endurance    Repetition Increases Symptoms    Special Tests +Stemmer bilaterally base of toes; Intake FOTO TBA    Patient Stated Goals reduce leg swelling, improve functional mobility and safe ambulation, improve skin condition, have better systemic fluid balance,    Pain Onset More than a month ago                        OT Treatments/Exercises (OP) - 01/01/21 0001      ADLs   ADL Education Given Yes      Manual Therapy   Manual Therapy Edema management;Manual Lymphatic Drainage  (MLD)    Manual Lymphatic Drainage (MLD) MLD to LLE utilizing functional inguinal regional LN, deep abdominal pathways, and terminus strokes.                  OT Education - 12/31/20 1103    Education Details Pt edu for simple self-MLD and lymphatic pumping ther ex.    Person(s) Educated Patient    Methods Explanation;Demonstration;Handout    Comprehension Verbalized understanding;Returned demonstration;Need further instruction               OT Long Term Goals - 12/13/20 1301      OT LONG TERM GOAL #1   Title Pt will be able to apply knee length, multi-layer, short stretch compression wraps to one leg using correct gradient techniques with Max caregiver assistance to decrease limb volume, to decrease leg pain and limit infection risk, and to improve safe functional ambulation and mobility.    Baseline dependent    Time 4    Period Days    Status New    Target Date --   4th OT Rx visit     OT LONG TERM GOAL #2   Title Pt will demonstrate understanding of lymphedema precautions  by being able to name and offer examples of 6 strategies using a printed reference for PRN.    Baseline Max A    Time 4    Period Days    Status New    Target Date --   4th OT Rx visit     OT LONG TERM GOAL #3   Title Pt will achieve and sustain a least 85% compliance performing all daily LE self-care home program components throughout Intensive Phase CDT, including elevation when seated, recommended skin care regime, lymphatic pumping ther ex, 23/7 compression wraps and simple self-MLD, with Max caregiver assistance to ensure optimal limb volume reduction, to limit infection risk and to limit LE progression.    Baseline dependent    Time 4    Period Weeks    Status New    Target Date 01/10/21      OT LONG TERM GOAL #4   Title Pt will achieve at least 10%  limb volume reductions bilaterally during Intensive Phase CDT to prevent re-accumulation of lymphatic congestion and progression of  fibrosis, to limit infection risk, to improve functional ambulation and transfers, and  to improve functional performance of basic and instrumental ADLs, and to limit LE progression.    Baseline dependent    Time 12    Period Weeks    Status New    Target Date 03/11/21      OT LONG TERM GOAL #5   Title By DC from OT Pt will be able to don and doff appropriate daytime compression garments and HOS devices with Max CG assistance to limit  lymphatic re-accumulation and LE progression with before transitioning to self-management phase of CDT.    Baseline dependent    Time 12    Period Weeks    Status New    Target Date 03/11/21                 Plan - 01/01/21 1054    Clinical Impression Statement Pt presents with [place] wraps applied at PCP's office from mid tibia region to ankles to address wounds and drainage until Pt is able to attend consult with wound care ctr in June. Pt requests stopping OT for LE after today  until wound care consult. Emphasis of visit shifted to Pt edu for simple self MLD with opportunities to practice while OT providing manual therapy tp LLE above wraps for demonstration and decongestion. . Pt demonstrated correct J stroke technique and verbalized understanding of sequence. Reviewed lymphatic pumping ther ex for BLE. Pt agrees to perform both self-MLD and ther ex daily as directed. She will continue with wraps at PCP's office until wound consult, and resume Intensive Phase CDT PRN. Cont as per POC.    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 Pt is unable to reach feet to apply essential multi-layer compression wraps .   Clinical Decision Making Several treatment options, min-mod task modification necessary    Comorbidities Affecting Occupational Performance: Presence of comorbidities impacting occupational performance    Comorbidities impacting occupational performance description: see Subjective    Modification or Assistance to Complete Evaluation  Min-Moderate modification of tasks or assist with assess necessary to complete eval    OT Frequency 3x / week    OT Duration 12 weeks   and ongoing PRN for management phase CDT support   OT Treatment/Interventions Self-care/ADL training;DME and/or AE instruction;Manual lymph drainage;Therapeutic activities;Compression bandaging;Therapeutic exercise;Coping strategies training;Other (comment);Functional Mobility Training;Manual Therapy;Patient/family education   skin care with low ph castor oil during MLD   Plan Intensive and Self-Management Phase Complete Decongestive Therapy (CDT): Treating one leg at a time below the knee to limit falls risk w/ Manual lymphatic drainage (MLD), skin care, compression bandages and fitting with appropriate compression garments  when volume reductions achieved, and lymphatic pumping ther ex    Recommended Other Services Pt will benefit from PT to improve balance, increase LB strength, increase endurance , and improve functional ambulation and mobility. Screening request submitted 12/11/20. Sequential pneumatic compression device contraindicated for this patient due to extensive cardiac precautions.    Consulted and Agree with Plan of Care Patient           Patient will benefit from skilled therapeutic intervention in order to  improve the following deficits and impairments:   Body Structure / Function / Physical Skills: ADL,Decreased knowledge of use of DME,Balance,Pain,Edema,ROM,IADL,Endurance,Scar mobility,Mobility,Decreased knowledge of precautions,Skin integrity       Visit Diagnosis: Lymphedema    Problem List Patient Active Problem List   Diagnosis Date Noted  . Sustained VT (ventricular tachycardia) ([place]) 09/21/2019  . 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 Acute  bronchitis 11/02/2016  . Unstable angina ([place]) 10/07/2015  . Chronic systolic heart failure ([place])   . PVC's (premature ventricular contractions)   . Ventricular fibrillation ([place]) 10/03/2015  . Cardiac arrest ([place]) 10/03/2015  . Essential hypertension 09/09/2015  . Lymphedema 02/21/2015  . Other specified hypothyroidism 02/21/2015    Lisa Spearman, MS, OTR/L, [facility] 01/01/21 12:19 PM   [place] MAIN [facility] SERVICES [address] [place], [place], [address] Phone: [phone]   Fax:  [phone]  Name: NAYALI TALERICO MRN: [id number] Date of Birth: Aug 19, 1940

## 2021-01-01 NOTE — Patient Instructions (Signed)
Lymphedema Self- Care Instructions  1. EXERCISE: Perform lymphatic pumping there ex 2 x a day. While wearing your compression wraps or garments. Perform 10 reps of each exercise bilaterally and be sure to perform them in order. Don;t skip around!  OMIT PARTIAL SIT UP  2. MLD: Perform simple self-Manual Lymphatic Drainage (MLD) at least once a day as directed.  3. WRAPS: Compression wraps are to be worn 23 hrs/ 7 days/wk during Intensive Phase of Complete Decongestive Therapy (CDT).Building tolerance may take time and practice, so don't get discouraged. If bandages begin to feel tight during periods of inactivity and/or during the night, try performing your exercises to loosen them.   4. GARMENTS: During Management Phase CDT your compression garments are to be worn during waking hours when active. Do NOT sleep in your garments!!   5. PUT YOUR FEET UP! Elevate your feet and legs and feet to the level of your heart whenever you are sitting down.   6. SKIN: Carefully monitor skin condition and perform impeccable hygiene daily. Bathe skin with mild soap and water and apply low pH lotion (aka Eucerin ) to improve hydration and limit infection risk.

## 2021-01-03 ENCOUNTER — Ambulatory Visit: Payer: Medicare Other | Admitting: Occupational Therapy

## 2021-01-07 ENCOUNTER — Ambulatory Visit: Payer: Medicare Other | Admitting: Occupational Therapy

## 2021-01-08 ENCOUNTER — Encounter: Payer: Medicare Other | Admitting: Occupational Therapy

## 2021-01-09 ENCOUNTER — Encounter: Payer: Medicare Other | Admitting: Occupational Therapy

## 2021-01-13 ENCOUNTER — Ambulatory Visit (INDEPENDENT_AMBULATORY_CARE_PROVIDER_SITE_OTHER): Payer: Medicare Other

## 2021-01-13 ENCOUNTER — Encounter: Payer: Medicare Other | Admitting: Occupational Therapy

## 2021-01-13 DIAGNOSIS — I5022 Chronic systolic (congestive) heart failure: Secondary | ICD-10-CM

## 2021-01-13 DIAGNOSIS — Z95810 Presence of automatic (implantable) cardiac defibrillator: Secondary | ICD-10-CM | POA: Diagnosis not present

## 2021-01-13 NOTE — Progress Notes (Signed)
EPIC Encounter for ICM Monitoring  Patient Name: Lisa [name] is a 80 y.o. female Date: 01/13/2021 Primary Care Physican: Lisa Neat, MD Primary Cardiologist:Lisa [name] Electrophysiologist:Lisa [name] Bi-V Pacing:98.2% 6/3/2022Office Weight:265lbs    Spoke with patient and reports feeling very tired. Heart failure questions reviewed. Pt asymptomatic.  She has missed a few doses of Torsemide in the last week.  Optivol thoracic impedancesuggesting possible fluid accumulation since 12/30/2020.  Prescribed:  Torsemide20 mg take 1 tablet every other day  Potassium 10 mEq take 1 tablet every other day  Labs: 05/15/2020 Creatinine1.31, Lisa [name], Potassium4.2, [id number], IDC30-13 05/04/2020 Creatinine1.52, BUN19, Potassium4.0, [id number], VJK82-06  05/03/2020 Creatinine1.36, BUN15, Potassium4.6, ORVIFB379, KFE76-14  05/02/2020 Creatinine1.21, BUN13, Potassium4.5, Sodium141, JWL29-57 A complete set of results can be found in Results Review.  Recommendations: She is back on track with taking Torsemide every other day as prescribed.    Follow-up plan: ICM clinic phone appointment on6/13/2022 to recheck fluid levels. 91 day device clinic remote transmission6/22/2022.   EP/Cardiology Office Visits:  Recall10/29/2022with Dr. Harrington [name]. Recall 11/25/2020 with Dr Lisa [name].  Copy of ICM check sent to Dr.Taylor   3 month ICM trend: 01/13/2021.    1 Year ICM trend:       Lisa Billings, RN 01/13/2021 12:01 PM

## 2021-01-14 ENCOUNTER — Encounter: Payer: Medicare Other | Admitting: Occupational Therapy

## 2021-01-15 ENCOUNTER — Encounter: Payer: Medicare Other | Admitting: Occupational Therapy

## 2021-01-16 ENCOUNTER — Encounter: Payer: Medicare Other | Admitting: Occupational Therapy

## 2021-01-17 ENCOUNTER — Encounter: Payer: Medicare Other | Admitting: Occupational Therapy

## 2021-01-20 ENCOUNTER — Ambulatory Visit (INDEPENDENT_AMBULATORY_CARE_PROVIDER_SITE_OTHER): Payer: Medicare Other

## 2021-01-20 ENCOUNTER — Other Ambulatory Visit: Payer: Self-pay

## 2021-01-20 ENCOUNTER — Encounter (HOSPITAL_BASED_OUTPATIENT_CLINIC_OR_DEPARTMENT_OTHER): Payer: Medicare Other | Attending: Internal Medicine | Admitting: Internal Medicine

## 2021-01-20 DIAGNOSIS — L97821 Non-pressure chronic ulcer of other part of left lower leg limited to breakdown of skin: Secondary | ICD-10-CM | POA: Insufficient documentation

## 2021-01-20 DIAGNOSIS — Z95810 Presence of automatic (implantable) cardiac defibrillator: Secondary | ICD-10-CM | POA: Diagnosis not present

## 2021-01-20 DIAGNOSIS — I130 Hypertensive heart and chronic kidney disease with heart failure and stage 1 through stage 4 chronic kidney disease, or unspecified chronic kidney disease: Secondary | ICD-10-CM | POA: Insufficient documentation

## 2021-01-20 DIAGNOSIS — I890 Lymphedema, not elsewhere classified: Secondary | ICD-10-CM | POA: Insufficient documentation

## 2021-01-20 DIAGNOSIS — I779 Disorder of arteries and arterioles, unspecified: Secondary | ICD-10-CM | POA: Diagnosis not present

## 2021-01-20 DIAGNOSIS — I5022 Chronic systolic (congestive) heart failure: Secondary | ICD-10-CM

## 2021-01-20 DIAGNOSIS — L97828 Non-pressure chronic ulcer of other part of left lower leg with other specified severity: Secondary | ICD-10-CM | POA: Diagnosis not present

## 2021-01-20 DIAGNOSIS — N183 Chronic kidney disease, stage 3 unspecified: Secondary | ICD-10-CM | POA: Insufficient documentation

## 2021-01-20 DIAGNOSIS — L97211 Non-pressure chronic ulcer of right calf limited to breakdown of skin: Secondary | ICD-10-CM | POA: Diagnosis not present

## 2021-01-20 NOTE — Progress Notes (Signed)
EUTHA, CUDE ([id number]) Visit Report for 01/20/2021 Abuse/Suicide Risk Screen Details Patient Name: Date of Service: Lisa [name] [name], Lisa [name] [name] 01/20/2021 9:00 A M Medical Record Number: [id number] Patient Account Number: [id number] Date of Birth/Sex: Treating RN: 1941/01/02 (80 y.o. Lisa [name] [name] Primary Care Jannetta Massey: [place] Nelida Meuse Other Clinician: Referring Steele Ledonne: Treating Laurent Cargile/Extender: [name] [name], Lisa [name] [name] in Treatment: 0 Abuse/Suicide Risk Screen Items Answer ABUSE RISK SCREEN: Has anyone close to you tried to hurt or harm you recentlyo No Do you feel uncomfortable with anyone in your familyo No Has anyone forced you do things that you didnt want to doo No Electronic Signature(s) Signed: 01/20/2021 5:21:11 PM By: Lorrin Jackson Entered By: Lorrin Jackson on 01/20/2021 09:25:47 -------------------------------------------------------------------------------- Activities of Daily Living Details Patient Name: Date of Service: Lisa [name] [name], Lisa [name] [name] 01/20/2021 9:00 A M Medical Record Number: [id number] Patient Account Number: [id number] Date of Birth/Sex: Treating RN: 05/06/1941 (80 y.o. Lisa [name] [name] Primary Care Ayn Domangue: [place] Nelida Meuse Other Clinician: Referring Ashutosh Dieguez: Treating Emelin Dascenzo/Extender: [name] [name], Lisa [name] [name] in Treatment: 0 Activities of Daily Living Items Answer Activities of Daily Living (Please select one for each item) Drive Automobile Completely Able T Medications ake Completely Able Use T elephone Completely Able Care for Appearance Completely Able Use T oilet Completely Able Bath / Shower Completely Able Dress Self Completely Able Feed Self Completely Able Walk Completely Able Get In / Out Bed Completely Able Housework Completely Able Prepare Meals Completely [place] for Self Completely Able Electronic Signature(s) Signed: 01/20/2021 5:21:11 PM By:  Lorrin Jackson Entered By: Lorrin Jackson on 01/20/2021 09:26:32 -------------------------------------------------------------------------------- Education Screening Details Patient Name: Date of Service: Lisa [name] [name], Lisa [name] [name] 01/20/2021 9:00 A M Medical Record Number: [id number] Patient Account Number: [id number] Date of Birth/Sex: Treating RN: [birth year] (80 y.o. Lisa [name] [name] Primary Care [place] Kirchner: [place] Nelida Meuse Other Clinician: Referring Treydon Henricks: Treating Arsema Tusing/Extender: [name] [name], Lisa [name] [name] in Treatment: 0 Primary Learner Assessed: Patient Learning Preferences/Education Level/Primary Language Learning Preference: Explanation, Demonstration, Printed Material Highest Education Level: College or Above Preferred Language: English Cognitive Barrier Language Barrier: No Translator Needed: No Memory Deficit: No Emotional Barrier: No Cultural/Religious Beliefs Affecting Medical Care: No Physical Barrier Impaired Vision: Yes Glasses Impaired Hearing: No Decreased Hand dexterity: No Knowledge/Comprehension Knowledge Level: High Comprehension Level: High Ability to understand written instructions: High Ability to understand verbal instructions: High Motivation Anxiety Level: Calm Cooperation: Cooperative Education Importance: Acknowledges Need Interest in Health Problems: Asks Questions Perception: Coherent Willingness to Engage in Self-Management High Activities: Readiness to Engage in Self-Management High Activities: Electronic Signature(s) Signed: 01/20/2021 5:21:11 PM By: Lorrin Jackson Entered By: Lorrin Jackson on 01/20/2021 09:27:19 -------------------------------------------------------------------------------- Fall Risk Assessment Details Patient Name: Date of Service: Lisa [name] [name], Lisa [name] [name] 01/20/2021 9:00 A M Medical Record Number: [id number] Patient Account Number: [id number] Date of Birth/Sex: Treating RN: 29-Nov-1940 (80 y.o.  Lisa [name] [name] Primary Care Vinessa Macconnell: [place] Nelida Meuse Other Clinician: Referring Raneen Jaffer: Treating Jamilya Sarrazin/Extender: [name] [name], Lisa [name] [name] in Treatment: 0 Fall Risk Assessment Items Have you had 2 or more falls in the last 12 monthso 0 No Have you had any fall that resulted in injury in the last 12 monthso 0 No FALLS RISK SCREEN History of falling - immediate or within 3 months 0 No Secondary diagnosis (Do you have 2 or more medical diagnoseso) 0 No Ambulatory aid None/bed rest/wheelchair/nurse 0  No Crutches/cane/walker 15 Yes Furniture 0 No Intravenous therapy Access/Saline/Heparin Lock 0 No Gait/Transferring Normal/ bed rest/ wheelchair 0 Yes Weak (short steps with or without shuffle, stooped but able to lift head while walking, may seek 0 No support from furniture) Impaired (short steps with shuffle, may have difficulty arising from chair, head down, impaired 0 No balance) Mental Status Oriented to own ability 0 Yes Electronic Signature(s) Signed: 01/20/2021 5:21:11 PM By: Lorrin Jackson Entered By: Lorrin Jackson on 01/20/2021 09:27:39 -------------------------------------------------------------------------------- Foot Assessment Details Patient Name: Date of Service: Lisa [name] [name], Lisa [name] [name] 01/20/2021 9:00 A M Medical Record Number: [id number] Patient Account Number: [id number] Date of Birth/Sex: Treating RN: [birth year] (80 y.o. Lisa [name] [name] Primary Care Gabi Mcfate: [name], Lisa [name] Other Clinician: Referring Kemani Demarais: Treating Stacey Sago/Extender: [name] [name], Lisa [name] [name] in Treatment: 0 Foot Assessment Items Site Locations + = Sensation present, - = Sensation absent, C = Callus, U = Ulcer R = Redness, W = Warmth, M = Maceration, PU = Pre-ulcerative lesion F = Fissure, S = Swelling, D = Dryness Assessment Right: Left: Other Deformity: No No Prior Foot Ulcer: No No Prior Amputation: No No Charcot Joint: No  No Ambulatory Status: Ambulatory With Help Assistance Device: Cane Gait: Steady Electronic Signature(s) Signed: 01/20/2021 5:21:11 PM By: Lorrin Jackson Entered By: Lorrin Jackson on 01/20/2021 09:33:26 -------------------------------------------------------------------------------- Nutrition Risk Screening Details Patient Name: Date of Service: Lisa [name] [name], Lisa [name] [name] 01/20/2021 9:00 A M Medical Record Number: [id number] Patient Account Number: [id number] Date of Birth/Sex: Treating RN: 1941/05/13 (80 y.o. Lisa [name] [name] Primary Care Dyon Rotert: [name], Lisa [name] Other Clinician: Referring Lennix Rotundo: Treating Farid Grigorian/Extender: [name] [name], Lisa [name] [name] in Treatment: 0 Height (in): 62 Weight (lbs): 268 Body Mass Index (BMI): 49 Nutrition Risk Screening Items Score Screening NUTRITION RISK SCREEN: I have an illness or condition that made me change the kind and/or amount of food I eat 0 No I eat fewer than two meals per day 0 No I eat few fruits and vegetables, or milk products 0 No I have three or more drinks of beer, liquor or wine almost every day 0 No I have tooth or mouth problems that make it hard for me to eat 0 No I don't always have enough money to buy the food I need 0 No I eat alone most of the time 0 No I take three or more different prescribed or over-the-counter drugs a day 1 Yes Without wanting to, I have lost or gained 10 pounds in the last six months 0 No I am not always physically able to shop, cook and/or feed myself 0 No Nutrition Protocols Good Risk Protocol 0 No interventions needed Moderate Risk Protocol High Risk Proctocol Risk Level: Good Risk Score: 1 Electronic Signature(s) Signed: 01/20/2021 5:21:11 PM By: Lorrin Jackson Entered By: Lorrin Jackson on 01/20/2021 09:27:59

## 2021-01-21 ENCOUNTER — Ambulatory Visit: Payer: Medicare Other | Admitting: Occupational Therapy

## 2021-01-21 NOTE — Progress Notes (Signed)
EPIC Encounter for ICM Monitoring  Patient Name: Lisa [name] is a 80 y.o. female Date: 01/21/2021 Primary Care Physican: [name], MD Primary Cardiologist: Harrington Challenger Electrophysiologist: Santina Evans Pacing: 98.2%         01/10/2021 Office Weight: 265 lbs                                                            Spoke with patient and reports legs are weeping and being followed by wound clinic. Heart failure questions reviewed. Pt asymptomatic.     Optivol thoracic impedance normal.   Prescribed:  Torsemide 20 mg take 1 tablet every other day Potassium 10 mEq take 1 tablet every other day   Labs: 01/02/2021 Creatinine 1.44, BUN 20, Potassium 3.9, Sodium 142, GFR 37 11/16/2020 Creatinine 1.56, BUN 20, Potassium 4.6, Sodium 140, GFR 34 09/13/2020 Creatinine 1.32, BUN 21, Potassium 3.8, Sodium 141, GFR 41 A complete set of results can be found in Results Review.   Recommendations:  No changes and encouraged to call if experiencing any fluid symptoms.   Follow-up plan: ICM clinic phone appointment on 02/24/2021.   91 day device clinic remote transmission 01/29/2021.     EP/Cardiology Office Visits:  Recall 06/07/2021 with Dr. Harrington Challenger.  Recall 11/25/2020 with Dr Lovena Le.   Copy of ICM check sent to Dr. Lovena Le   3 month ICM trend: 01/20/2021.    1 Year ICM trend:       [name], RN 01/21/2021 3:40 PM

## 2021-01-21 NOTE — Progress Notes (Signed)
Lisa [name], KUWAHARA ([id number]) Visit Report for 01/20/2021 Chief Complaint Document Details Patient [name]: Date of Service: Lisa [name], [name] 01/20/2021 9:00 A M Medical Record Number: [id number] Patient Account Number: [id number] Date of Birth/Sex: Treating RN: July 31, 1941 (80 y.o. Female) Baruch Gouty Primary Care Provider: CA Nelida Meuse Other Clinician: Referring Provider: Treating Provider/Extender: [name] [name], [name] in Treatment: 0 Information Obtained from: Patient Chief Complaint 01/20/2021; patient [name] here for review of the wound on the right posterior calf however also has weeping de-epithelialized areas on the left leg as well in the setting of severe bilateral lymphedema Electronic Signature(s) Signed: 01/20/2021 5:24:16 PM By: [name] MD Entered By: [name] on 01/20/2021 10:39:52 -------------------------------------------------------------------------------- HPI Details Patient [name]: Date of Service: Lisa [name], [name] 01/20/2021 9:00 A M Medical Record Number: [id number] Patient Account Number: [id number] Date of Birth/Sex: Treating RN: 18-Dec-1940 (80 y.o. Female) Baruch Gouty Primary Care Provider: CA Carl Best, Irene Other Clinician: Referring Provider: Treating Provider/Extender: [name] [name], [name] in Treatment: 0 History of Present Illness HPI Description: ADMISSION 01/20/2021 This [name] a 80 year old woman who tells me that she has had problems with lymphedema since the 1970s. She does not have, or has not been given, a clear explanation for this. She has been followed for many years at various lymphedema clinics including in [place] and then in [place] and now sees Stephani Police at [place]. Developed a wound on the right posterior calf about a month ago. When that happened she [name] no longer able to have lymphedema therapy on this leg. She has been seeing her primary doctor who  dressed this with DuoDERM and more recently with an Haematologist. The wounds were also debrided on the posterior The patient has 79 year old compression pumps but I am not sure of the frequency of their use she also has juxta lites but again I am not sure how frequently unreliably the patient [name] using them. She has open wounds on the right posterior and weeping de-epithelialized area predominantly on the left anterior and left anterior lateral. 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 tach and has a cardioverter defibrillator, gout and obstructive sleep apnea. She also has had a melanoma on the right leg and there [name] an extensive scar in the middle of lymphedema on the right anterior lower leg ABIs in our clinic were noncompressible bilaterally Electronic Signature(s) Signed: 01/20/2021 5:24:16 PM By: [name] MD Entered By: [name] on 01/20/2021 10:44:44 -------------------------------------------------------------------------------- Physical Exam Details Patient [name]: Date of Service: Lisa [name], [name] 01/20/2021 9:00 A M Medical Record Number: [id number] Patient Account Number: [id number] Date of Birth/Sex: Treating RN: 1941-05-05 (80 y.o. Female) Baruch Gouty Primary Care Provider: CA Nelida Meuse Other Clinician: Referring Provider: Treating Provider/Extender: [name] [name], [name] in Treatment: 0 Constitutional Sitting or standing Blood Pressure [name] within target range for patient.. Pulse regular and within target range for patient.[name] Respirations regular, non-labored and within target range.. Temperature [name] normal and within the target range for the patient.[name] Appears in no distress. Respiratory work of breathing [name] normal. Bilateral breath sounds are clear and equal in all lobes with  no wheezes, rales or rhonchi.. Cardiovascular Heart sounds were normal no increase in jugular venous pressure no sacral edema. Pedal pulses were palpable bilaterally. Severe  stage III lymphedema bilaterally. There [name] no pitting. This extends into her posterior thighs. Integumentary (Hair, Skin) She has bilateral nodular areas which are not uncommon in people with advanced lymphedema. On the right medial leg she has a cauliflower shaped fleshy raised area. This also sometimes happens in people with advanced lymphedema however this area bears watching over time. Notes Wound exam; the patient has a quarter sized circular wound on the right posterior calf. Debris on the surface removed with Anasept and gauze. - On the left there [name] no open wound per se however she has weepy de-epithelialized areas anteriorly and anterior laterally. I did not see anything posteriorly on the left calf She has severe bilateral lymphedema that extends posteriorly into her thighs. 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 Electronic Signature(s) Signed: 01/20/2021 5:24:16 PM By: [name] MD Entered By: [name] on 01/20/2021 10:51:39 -------------------------------------------------------------------------------- Physician Orders Details Patient [name]: Date of Service: Lisa [name], [name] 01/20/2021 9:00 A M Medical Record Number: [id number] Patient Account Number: [id number] Date of Birth/Sex: Treating RN: [birth year] (80 y.o. Female) Baruch Gouty Primary Care Provider: CA Carl Best, [place]ard Other Clinician: Referring Provider: Treating Provider/Extender: [name] [name], [name] in Treatment: 0 Verbal / Phone Orders: No Diagnosis Coding Follow-up Appointments Return Appointment in 1 week. Bathing/ Shower/ Hygiene May shower with protection but do not get wound dressing(s) wet. Edema Control - Lymphedema / SCD / Other Bilateral Lower  Extremities Lymphedema Pumps. Use Lymphedema pumps on leg(s) 2-3 times a day for 45-60 minutes. If wearing any wraps or hose, do not remove them. Continue exercising as instructed. - use over compression wraps Elevate legs to the level of the heart or above for 30 minutes daily and/or when sitting, a frequency of: - throughout the day Avoid standing for long periods of time. Exercise regularly Non Wound Condition Left Lower Extremity pply the following to affected area as directed: - silver alginate to any weeping areas, cover with ABDs and 4 layer compression wrap A Wound Treatment Wound #1 - Lower Leg Wound Laterality: Right, Posterior Peri-Wound Care: Sween Lotion (Moisturizing lotion) Discharge Instructions: Apply moisturizing lotion as directed Prim Dressing: KerraCel Ag Gelling Fiber Dressing, 2x2 in (silver alginate) ary Discharge Instructions: Apply silver alginate to wound bed as instructed Secondary Dressing: Woven Gauze Sponge, Non-Sterile 4x4 in Discharge Instructions: Apply over primary dressing as directed. Secondary Dressing: ABD Pad, 5x9 Discharge Instructions: Apply over primary dressing as directed. Compression Wrap: FourPress (4 layer compression wrap) Discharge Instructions: Apply four layer compression as directed. May also use Miliken CoFlex 2 layer compression system as alternative. Electronic Signature(s) Signed: 01/20/2021 5:24:16 PM By: [name] MD Signed: 01/21/2021 6:10:01 PM By: Baruch Gouty RN, BSN Entered By: Baruch Gouty on 01/20/2021 10:13:56 -------------------------------------------------------------------------------- Problem List Details Patient [name]: Date of Service: Lisa [name], [name] 01/20/2021 9:00 A M Medical Record Number: [id number] Patient Account Number: [id number] Date of Birth/Sex: Treating RN: [birth year] (80 y.o. Female) Baruch Gouty Primary Care Provider: CA Nelida Meuse Other Clinician: Referring  Provider: Treating Provider/Extender: [name] [name], [name] in Treatment: 0 Active Problems ICD-10 Encounter Code Description Active Date MDM Diagnosis I89.0 Lymphedema, not elsewhere classified 01/20/2021 No Yes L97.211 Non-pressure chronic ulcer of right calf limited to breakdown of skin 01/20/2021 No Yes L97.828 Non-pressure chronic ulcer of other part of left lower leg with other specified 01/20/2021 No Yes severity Inactive Problems Resolved Problems Electronic Signature(s) Signed: 01/20/2021  5:24:16 PM By: [name] MD Entered By: [name] on 01/20/2021 10:26:04 -------------------------------------------------------------------------------- Progress Note Details Patient [name]: Date of Service: Lisa [name], [name] 01/20/2021 9:00 A M Medical Record Number: [id number] Patient Account Number: [id number] Date of Birth/Sex: Treating RN: June 16, 1941 (80 y.o. Female) Baruch Gouty Primary Care Provider: CA Nelida Meuse Other Clinician: Referring Provider: Treating Provider/Extender: [name] [name], [name] in Treatment: 0 Subjective Chief Complaint Information obtained from Patient 01/20/2021; patient [name] here for review of the wound on the right posterior calf however also has weeping de-epithelialized areas on the left leg as well in the setting of severe bilateral lymphedema History of Present Illness (HPI) ADMISSION 01/20/2021 This [name] a 80 year old woman who tells me that she has had problems with lymphedema since the 1970s. She does not have, or has not been given, a clear explanation for this. She has been followed for many years at various lymphedema clinics including in [place] and then in [place] and now sees Stephani Police at [place]. Developed a wound on the right posterior calf about a month ago. When that happened she [name] no longer able to have lymphedema therapy on this leg. She has been seeing her primary  doctor who dressed this with DuoDERM and more recently with an Haematologist. The wounds were also debrided on the posterior The patient has 79 year old compression pumps but I am not sure of the frequency of their use she also has juxta lites but again I am not sure how frequently unreliably the patient [name] using them. She has open wounds on the right posterior and weeping de-epithelialized area predominantly on the left anterior and left anterior lateral. 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 tach and has a cardioverter defibrillator, gout and obstructive sleep apnea. She also has had a melanoma on the right leg and there [name] an extensive scar in the middle of lymphedema on the right anterior lower leg ABIs in our clinic were noncompressible bilaterally Patient History Information obtained from Patient. Allergies adhesive tape, meperidine (Reaction: N/V), doxycycline (Reaction: nausea), metronidazole (Reaction: rash), Opioids - Morphine Analogues, penicillin, sulfamethoxazole, wheat, lanolin Family History Cancer - Father, Heart Disease - Paternal Grandparents,Maternal Grandparents,Mother, Hypertension - Maternal Grandparents,Paternal Grandparents,Mother, No family history of Diabetes, Hereditary Spherocytosis, Kidney Disease, Lung Disease, Seizures, Stroke, Thyroid Problems, Tuberculosis. Social History Never smoker, Marital Status - Divorced, Alcohol Use - Rarely, Caffeine Use - Never. Medical History Hematologic/Lymphatic Patient has history of Lymphedema Cardiovascular Patient has history of Arrhythmia - Ventricular Fib, Congestive Heart Failure, Coronary Artery Disease, Hypertension, Myocardial Infarction Musculoskeletal Patient has history of Gout, Osteoarthritis Oncologic Patient has history of Received  Chemotherapy Medical A Surgical History Notes nd Cardiovascular Has Defibrillator Endocrine Hypothryoidsm Genitourinary CKD state G3b Oncologic Leukemia (CML) Review of Systems (ROS) Eyes Complains or has symptoms of Glasses / Contacts. Ear/Nose/Mouth/Throat Denies complaints or symptoms of Chronic sinus problems or rhinitis. Gastrointestinal Denies complaints or symptoms of Frequent diarrhea, Nausea, Vomiting. Integumentary (Skin) Complains or has symptoms of Wounds. Neurologic Denies complaints or symptoms of Numbness/parasthesias. Psychiatric Denies complaints or symptoms of Claustrophobia, Suicidal. Objective Constitutional Sitting or standing Blood Pressure [name] within target range for patient.. Pulse regular and within target range for patient.[name] Respirations regular, non-labored and within target range.. Temperature [name] normal and within the target range for the patient.[name] Appears in no distress. Vitals Time  Taken: 9:10 AM, Height: 62 in, Source: Stated, Weight: 268 lbs, Source: Stated, BMI: 49, Temperature: 98 F, Pulse: 75 bpm, Respiratory Rate: 20 breaths/min, Blood Pressure: 130/74 mmHg. Respiratory work of breathing [name] normal. Bilateral breath sounds are clear and equal in all lobes with no wheezes, rales or rhonchi.. Cardiovascular Heart sounds were normal no increase in jugular venous pressure no sacral edema. Pedal pulses were palpable bilaterally. Severe stage III lymphedema bilaterally. There [name] no pitting. This extends into her posterior thighs. General Notes: Wound exam; the patient has a quarter sized circular wound on the right posterior calf. Debris on the surface removed with Anasept and gauze. - On the left there [name] no open wound per se however she has weepy de-epithelialized areas anteriorly and anterior laterally. I did not see anything posteriorly on the left calf oo She has severe bilateral lymphedema that extends posteriorly into her thighs. Classic  pantaloon deformities in the lower legs just above the ankles. Integumentary (Hair, Skin) She has bilateral nodular areas which are not uncommon in people with advanced lymphedema. On the right medial leg she has a cauliflower shaped fleshy raised area. This also sometimes happens in people with advanced lymphedema however this area bears watching over time. Wound #1 status [name] Open. Original cause of wound was Gradually Appeared. The date acquired was: 09/30/2020. The wound [name] located on the Right,Posterior Lower Leg. The wound measures 1.8cm length x 2cm width x 0.2cm depth; 2.827cm^2 area and 0.565cm^3 volume. There [name] Fat Layer (Subcutaneous Tissue) exposed. There [name] no tunneling or undermining noted. There [name] a medium amount of serosanguineous drainage noted. The wound margin [name] well defined and not attached to the wound base. There [name] large (67-100%) red granulation within the wound bed. There [name] a small (1-33%) amount of necrotic tissue within the wound bed including Adherent Slough. 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 There was a Four Layer Compression Therapy Procedure by Lorrin Jackson, RN. Post procedure Diagnosis Wound #1: Same as Pre-Procedure There was a Four Layer Compression Therapy Procedure by Lorrin Jackson, RN. Post procedure Diagnosis Wound #: Same as Pre-Procedure Plan Follow-up Appointments: Return Appointment in 1 week. Bathing/ Shower/ Hygiene: May shower with protection but do not get wound dressing(s) wet. Edema Control - Lymphedema / SCD / Other: Lymphedema Pumps. Use Lymphedema pumps on leg(s) 2-3 times a day for 45-60 minutes. If wearing any wraps or hose,  do not remove them. Continue exercising as instructed. - use over compression wraps Elevate legs to the level of the heart or above for 30 minutes daily and/or when sitting, a frequency of: - throughout the day Avoid standing for long periods of time. Exercise regularly Non Wound Condition: Apply the following to affected area as directed: - silver alginate to any weeping areas, cover with ABDs and 4 layer compression wrap WOUND #1: - Lower Leg Wound Laterality: Right, Posterior Peri-Wound Care: Sween Lotion (Moisturizing lotion) Discharge Instructions: Apply moisturizing lotion as directed Prim Dressing: KerraCel Ag Gelling Fiber Dressing, 2x2 in (silver alginate) ary Discharge Instructions: Apply silver alginate to wound bed as instructed Secondary Dressing: Woven Gauze Sponge, Non-Sterile 4x4 in Discharge Instructions: Apply over primary dressing as directed. Secondary Dressing: ABD Pad, 5x9 Discharge Instructions: Apply over primary  dressing as directed. Com pression Wrap: FourPress (4 layer compression wrap) Discharge Instructions: Apply four layer compression as directed. May also use Miliken CoFlex 2 layer compression system as alternative. 1. Lymphedema which sounds secondary but of undetermined etiology which the patient has been dealing with for years 2. She has a large surgical scar on the right anterior lower leg apparently secondary to a melanoma extraction but this was remotely and I do not think has anything to do with the cause of her bilateral lymphedema. The patient denies any prior history of radiation etc. 3. Patient also has 79 year old compression pumps. She does not think she [name] going to be able to get them over her legs with our wraps on but I would like her to try. If this can work then I would like her to use these for 1 hour twice a day. The degree of lymphedema she has will preclude easy healing of any wound and make her at risk for further skin breakdown 4. We  use silver alginate ABDs and 4-layer compression bilaterally. I do not think she has a primary arterial issue her peripheral pulses are palpable. 5. The patient expressed some concern about fluid mobilization in her legs especially with 4-layer compression and the use of her compression pumps. I looked up her last echocardiogram which showed an EF of 45 to 72% grade 2 diastolic dysfunction. Her right ventricular function was normal. This was about a year ago. By itself I am not sure that that would necessarily suggest she [name] going to have a problem dealing with her lower extremity lymphedema with compression and I told the patient we are just going to have to try but if she [name] uncomfortable in any way not to use the pumps and to come back and see [place] next week 6. I saw no evidence of congestive heart failure at the bedside 7. For now I do not think she needs to attend the lymphedema clinic in [place] until after we get these closed. 8. She [name] going to bring in the pumps next week if we think these need to be replaced I think we can try and reorder them. We would also like to know which company she was dealing with I spent 45 minutes in review of this patient's past medical history, face-to-face evaluation and preparation of this record Electronic Signature(s) Signed: 01/20/2021 5:24:16 PM By: [name] MD Entered By: [name] on 01/20/2021 10:55:57 -------------------------------------------------------------------------------- HxROS Details Patient [name]: Date of Service: Lisa [name], [name] 01/20/2021 9:00 A M Medical Record Number: [id number] Patient Account Number: [id number] Date of Birth/Sex: Treating RN: November 09, 1940 (80 y.o. Female) Lorrin Jackson Primary Care Provider: CA Nelida Meuse Other Clinician: Referring Provider: Treating Provider/Extender: [name] [name], [name] in Treatment: 0 Information Obtained From Patient Eyes Complaints and  Symptoms: Positive for: Glasses / Contacts Ear/Nose/Mouth/Throat Complaints and Symptoms: Negative for: Chronic sinus problems or rhinitis Gastrointestinal Complaints and Symptoms: Negative for: Frequent diarrhea; Nausea; Vomiting Integumentary (Skin) Complaints and Symptoms: Positive for: Wounds Neurologic Complaints and Symptoms: Negative for: Numbness/parasthesias Psychiatric Complaints and Symptoms: Negative for: Claustrophobia; Suicidal Hematologic/Lymphatic Medical History: Positive for: Lymphedema Respiratory Cardiovascular Medical History: Positive for: Arrhythmia - Ventricular Fib; Congestive Heart Failure; Coronary Artery Disease; Hypertension; Myocardial Infarction Past Medical History Notes: Has Defibrillator Endocrine Medical History: Past Medical History Notes: Hypothryoidsm Genitourinary Medical History: Past Medical History Notes: CKD state G3b Immunological Musculoskeletal Medical History: Positive for: Gout; Osteoarthritis Oncologic Medical History: Positive for: Received  Chemotherapy Past Medical History Notes: Leukemia (CML) Immunizations Pneumococcal Vaccine: Received Pneumococcal Vaccination: No Implantable Devices Yes Family and Social History Cancer: Yes - Father; Diabetes: No; Heart Disease: Yes - Paternal Grandparents,Maternal Grandparents,Mother; Hereditary Spherocytosis: No; Hypertension: Yes - Maternal Grandparents,Paternal Grandparents,Mother; Kidney Disease: No; Lung Disease: No; Seizures: No; Stroke: No; Thyroid Problems: No; Tuberculosis: No; Never smoker; Marital Status - Divorced; Alcohol Use: Rarely; Caffeine Use: Never; Financial Concerns: No; Food, Clothing or Shelter Needs: No; Support System Lacking: No; Transportation Concerns: No Electronic Signature(s) Signed: 01/20/2021 5:21:11 PM By: Lorrin Jackson Signed: 01/20/2021 5:24:16 PM By: [name] MD Entered By: Lorrin Jackson on 01/20/2021  09:25:41 -------------------------------------------------------------------------------- Kings Details Patient [name]: Date of Service: Lisa [name], [name] 01/20/2021 Medical Record Number: [id number] Patient Account Number: [id number] Date of Birth/Sex: Treating RN: 1940/10/01 (80 y.o. Female) Baruch Gouty Primary Care Provider: [name], LISA Other Clinician: Referring Provider: Treating Provider/Extender: [name] [name], [name] in Treatment: 0 Diagnosis Coding ICD-10 Codes Code Description I89.0 Lymphedema, not elsewhere classified L97.211 Non-pressure chronic ulcer of right calf limited to breakdown of skin L97.828 Non-pressure chronic ulcer of other part of left lower leg with other specified severity Facility Procedures CPT4: Code [id number] Description: 13 - WOUND CARE VISIT-LEV 3 EST PT Modifier: 25 Quantity: 1 CPT4: [id number] Description: 81 BILATERAL: Application of multi-layer venous compression system; leg (below knee), including ankle and t. Modifier: Quantity: 1 Physician Procedures : CPT4 Code Description Modifier [id number] - WC PHYS LEVEL 4 - NEW PT ICD-10 Diagnosis Description I89.0 Lymphedema, not elsewhere classified J67.341 Non-pressure chronic ulcer of right calf limited to breakdown of skin L97.828 Non-pressure chronic  ulcer of other part of left lower leg with other specified severity Quantity: 1 Electronic Signature(s) Signed: 01/20/2021 5:24:16 PM By: [name] MD Entered By: [name] on 01/20/2021 10:56:22

## 2021-01-22 ENCOUNTER — Encounter: Payer: Medicare Other | Admitting: Occupational Therapy

## 2021-01-22 NOTE — Progress Notes (Signed)
CHAROLETT, YARROW ([id number]) Visit Report for 01/20/2021 Allergy List Details Patient Name: Date of Service: [name], [name] 01/20/2021 9:00 A M Medical Record Number: [id number] Patient Account Number: [id number] Date of Birth/Sex: Treating RN: [birth year] (80 y.o. Female) Lorrin Jackson Primary Care Brody Kump: [place] Nelida Meuse Other Clinician: Referring Breyona Swander: Treating Joseandres Mazer/Extender: [name] [name], [name] in Treatment: 0 Allergies Active Allergies adhesive tape meperidine Reaction: N/V doxycycline Reaction: nausea metronidazole Reaction: rash Opioids - Morphine Analogues penicillin sulfamethoxazole wheat lanolin Allergy Notes Electronic Signature(s) Signed: 01/20/2021 5:21:11 PM By: Lorrin Jackson Entered By: Lorrin Jackson on 01/20/2021 09:15:49 -------------------------------------------------------------------------------- Arrival Information Details Patient Name: Date of Service: [name], [name] 01/20/2021 9:00 A M Medical Record Number: [id number] Patient Account Number: [id number] Date of Birth/Sex: Treating RN: Jan 02, 1941 (80 y.o. Female) Lorrin Jackson Primary Care Alberta Lenhard: [place] Nelida Meuse Other Clinician: Referring Kailan Carmen: Treating Enriqueta Augusta/Extender: [name] [name], [name] in Treatment: 0 Visit Information Patient Arrived: [name] Arrival Time: 09:06 Transfer Assistance: Manual Patient Identification Verified: Yes Secondary Verification Process Completed: Yes Patient Requires Transmission-Based Precautions: No Patient Has Alerts: Yes Patient Alerts: Patient on Blood Thinner Defibrillator ABI=[place] Bilaterally Electronic Signature(s) Signed: 01/20/2021 5:21:11 PM By: Lorrin Jackson Entered By: Lorrin Jackson on 01/20/2021 09:46:57 -------------------------------------------------------------------------------- Clinic Level of Care Assessment Details Patient Name: Date of Service: [name], MA  [name] 01/20/2021 9:00 A M Medical Record Number: [id number] Patient Account Number: [id number] Date of Birth/Sex: Treating RN: 1941/05/20 (80 y.o. Female) Baruch Gouty Primary Care Manasvini Whatley: [place] Nelida Meuse Other Clinician: Referring Raenah Murley: Treating Christopher Hink/Extender: [name] [name], [name] in Treatment: 0 Clinic Level of Care Assessment Items TOOL 1 Quantity Score []  - 0 Use when EandM and Procedure is performed on INITIAL visit ASSESSMENTS - Nursing Assessment / Reassessment X- 1 20 General Physical Exam (combine w/ comprehensive assessment (listed just below) when performed on new pt. evals) X- 1 25 Comprehensive Assessment (HX, ROS, Risk Assessments, Wounds Hx, etc.) ASSESSMENTS - Wound and Skin Assessment / Reassessment []  - 0 Dermatologic / Skin Assessment (not related to wound area) ASSESSMENTS - Ostomy and/or Continence Assessment and Care []  - 0 Incontinence Assessment and Management []  - 0 Ostomy Care Assessment and Management (repouching, etc.) PROCESS - Coordination of Care X - Simple Patient / Family Education for ongoing care 1 15 []  - 0 Complex (extensive) Patient / Family Education for ongoing care X- 1 10 Staff obtains Programmer, systems, Records, T Results / Process Orders est []  - 0 Staff telephones HHA, Nursing Homes / Clarify orders / etc []  - 0 Routine Transfer to another Facility (non-emergent condition) []  - 0 Routine Hospital Admission (non-emergent condition) X- 1 15 New Admissions / Biomedical engineer / Ordering NPWT Apligraf, etc. , []  - 0 Emergency Hospital Admission (emergent condition) PROCESS - Special Needs []  - 0 Pediatric / Minor Patient Management []  - 0 Isolation Patient Management []  - 0 Hearing / Language / Visual special needs []  - 0 Assessment of Community assistance (transportation, D/C planning, etc.) []  - 0 Additional assistance / Altered mentation []  - 0 Support Surface(s) Assessment (bed,  cushion, seat, etc.) INTERVENTIONS - Miscellaneous []  - 0 External ear exam []  - 0 Patient Transfer (multiple staff / Civil Service fast streamer / Similar devices) []  - 0 Simple Staple / Suture removal (25 or less) []  - 0 Complex Staple / Suture removal (26 or more) []  - 0 Hypo/Hyperglycemic Management (do not check if billed  separately) X- 1 15 Ankle / Brachial Index (ABI) - do not check if billed separately Has the patient been seen at the hospital within the last three years: Yes Total Score: 100 Level Of Care: New/Established - Level 3 Electronic Signature(s) Signed: 01/21/2021 6:10:01 PM By: Baruch Gouty RN, BSN Entered By: Baruch Gouty on 01/20/2021 10:07:46 -------------------------------------------------------------------------------- Compression Therapy Details Patient Name: Date of Service: [name], [name] 01/20/2021 9:00 A M Medical Record Number: [id number] Patient Account Number: [id number] Date of Birth/Sex: Treating RN: [birth year] (80 y.o. Female) Baruch Gouty Primary Care Raelynne Ludwick: [place] Carl Best, [place] Other Clinician: Referring Esvin Hnat: Treating Nicky Kras/Extender: [name] [name], [name] in Treatment: 0 Compression Therapy Performed for Wound Assessment: Wound #1 Right,Posterior Lower Leg Performed By: Clinician Lorrin Jackson, RN Compression Type: Four Layer Post Procedure Diagnosis Same as Pre-procedure Electronic Signature(s) Signed: 01/21/2021 6:10:01 PM By: Baruch Gouty RN, BSN Entered By: Baruch Gouty on 01/20/2021 10:08:25 -------------------------------------------------------------------------------- Compression Therapy Details Patient Name: Date of Service: [name], [name] 01/20/2021 9:00 A M Medical Record Number: [id number] Patient Account Number: [id number] Date of Birth/Sex: Treating RN: July 23, 1941 (80 y.o. Female) Baruch Gouty Primary Care Anastasija Anfinson: [place] Carl Best, [place] Other Clinician: Referring  Kaycie Pegues: Treating Nellie Pester/Extender: [name] [name], [name] in Treatment: 0 Compression Therapy Performed for Wound Assessment: NonWound Condition Lymphedema - Left Leg Performed By: Clinician Lorrin Jackson, RN Compression Type: Four Layer Post Procedure Diagnosis Same as Pre-procedure Electronic Signature(s) Signed: 01/21/2021 6:10:01 PM By: Baruch Gouty RN, BSN Entered By: Baruch Gouty on 01/20/2021 10:08:51 -------------------------------------------------------------------------------- Encounter Discharge Information Details Patient Name: Date of Service: [name], [name] 01/20/2021 9:00 A M Medical Record Number: [id number] Patient Account Number: [id number] Date of Birth/Sex: Treating RN: [birth year] (80 y.o. Female) Lorrin Jackson Primary Care Joyanna Kleman: [place] Nelida Meuse Other Clinician: Referring Jennifier Smitherman: Treating Beryl Balz/Extender: [name] [name], [name] in Treatment: 0 Encounter Discharge Information Items Discharge Condition: Stable Ambulatory Status: Cane Discharge Destination: Home Transportation: Private Auto Schedule Follow-up Appointment: Yes Clinical Summary of Care: Provided on 01/20/2021 Form Type Recipient Paper Patient Patient Electronic Signature(s) Signed: 01/20/2021 12:39:08 PM By: Lorrin Jackson Entered By: Lorrin Jackson on 01/20/2021 12:39:08 -------------------------------------------------------------------------------- Lower Extremity Assessment Details Patient Name: Date of Service: [name], [name] 01/20/2021 9:00 A M Medical Record Number: [id number] Patient Account Number: [id number] Date of Birth/Sex: Treating RN: 18-Jan-1941 (80 y.o. Female) Lorrin Jackson Primary Care Hazaiah Edge[place]: [name], LISA Other Clinician: Referring Sumaiyah Markert: Treating Bobbyjo Marulanda/Extender: [name] [name], [name] in Treatment: 0 Edema Assessment Assessed: [Left: Yes] [Right: Yes] Edema:  [Left: Yes] [Right: Yes] Calf Left: Right: Point of Measurement: 30 cm From Medial Instep 67 cm 70 cm Ankle Left: Right: Point of Measurement: 6 cm From Medial Instep 33 cm 35 cm Knee To Floor Left: Right: From Medial Instep 39 cm 39 cm Vascular Assessment Pulses: Dorsalis Pedis Palpable: [Left:Yes Yes] [Right:Yes Yes] Notes ABI's non compressible Electronic Signature(s) Signed: 01/20/2021 5:21:11 PM By: Lorrin Jackson Entered By: Lorrin Jackson on 01/20/2021 09:46:19 -------------------------------------------------------------------------------- Multi Wound Chart Details Patient Name: Date of Service: [name], [name] 01/20/2021 9:00 A M Medical Record Number: [id number] Patient Account Number: [id number] Date of Birth/Sex: Treating RN: 10/12/1940 (80 y.o. Female) Baruch Gouty Primary Care Markale Birdsell: [place] Carl Best,  Other Clinician: Referring Pinkey Mcjunkin: Treating Yuna Pizzolato/Extender: [name] [name], [name] in Treatment: 0 Vital Signs Height(in): 62 Pulse(bpm): 75 Weight(lbs): 244  Blood Pressure(mmHg): 130/74 Body Mass Index(BMI): 49 Temperature(F): 98 Respiratory Rate(breaths/min): 20 Photos: [1:No Photos Right, Posterior Lower Leg] [N/A:N/A N/A] Wound Location: [1:Gradually Appeared] [N/A:N/A] Wounding Event: [1:Lymphedema] [N/A:N/A] Primary Etiology: [1:Lymphedema, Arrhythmia, Congestive N/A] Comorbid History: [1:Heart Failure, Coronary Artery Disease, Hypertension, Myocardial Infarction, Gout, Osteoarthritis, Received Chemotherapy 09/30/2020] [N/A:N/A] Date Acquired: [1:0] [N/A:N/A] Weeks of Treatment: [1:Open] [N/A:N/A] Wound Status: [1:1.8x2x0.2] [N/A:N/A] Measurements L x W x D (cm) [1:2.827] [N/A:N/A] A (cm) : rea [1:0.565] [N/A:N/A] Volume (cm) : [1:Full Thickness Without Exposed] [N/A:N/A] Classification: [1:Support Structures Medium] [N/A:N/A] Exudate Amount: [1:Serosanguineous] [N/A:N/A] Exudate Type: [1:red, brown]  [N/A:N/A] Exudate Color: [1:Well defined, not attached] [N/A:N/A] Wound Margin: [1:Large (67-100%)] [N/A:N/A] Granulation Amount: [1:Red] [N/A:N/A] Granulation Quality: [1:Small (1-33%)] [N/A:N/A] Necrotic Amount: [1:Fat Layer (Subcutaneous Tissue): Yes N/A] Exposed Structures: [1:Fascia: No Tendon: No Muscle: No Joint: No Bone: No None] [N/A:N/A] Epithelialization: [1:Compression Therapy] [N/A:N/A] Treatment Notes Electronic Signature(s) Signed: 01/20/2021 5:24:16 PM By: [name] MD Signed: 01/21/2021 6:10:01 PM By: Baruch Gouty RN, BSN Entered By: [name] on 01/20/2021 10:39:07 -------------------------------------------------------------------------------- Multi-Disciplinary Care Plan Details Patient Name: Date of Service: [name], [name] 01/20/2021 9:00 A M Medical Record Number: [id number] Patient Account Number: [id number] Date of Birth/Sex: Treating RN: Aug 01, 1941 (80 y.o. Female) Baruch Gouty Primary Care Basha Krygier: [place] Nelida Meuse Other Clinician: Referring Judieth Mckown: Treating Micaiah Remillard/Extender: [name] [name], [name] in Treatment: 0 Multidisciplinary Care Plan reviewed with physician Active Inactive Venous Leg Ulcer Nursing Diagnoses: Knowledge deficit related to disease process and management Potential for venous Insuffiency (use before diagnosis confirmed) Goals: Patient will maintain optimal edema control Date Initiated: 01/20/2021 Target Resolution Date: 02/17/2021 Goal Status: Active Interventions: Assess peripheral edema status every visit. Compression as ordered Treatment Activities: Therapeutic compression applied : 01/20/2021 Notes: Wound/Skin Impairment Nursing Diagnoses: Impaired tissue integrity Knowledge deficit related to ulceration/compromised skin integrity Goals: Patient/caregiver will verbalize understanding of skin care regimen Date Initiated: 01/20/2021 Target Resolution Date: 02/17/2021 Goal  Status: Active Ulcer/skin breakdown will have a volume reduction of 30% by week 4 Date Initiated: 01/20/2021 Target Resolution Date: 02/17/2021 Goal Status: Active Interventions: Assess patient/caregiver ability to obtain necessary supplies Assess patient/caregiver ability to perform ulcer/skin care regimen upon admission and as needed Assess ulceration(s) every visit Provide education on ulcer and skin care Treatment Activities: Skin care regimen initiated : 01/20/2021 Topical wound management initiated : 01/20/2021 Notes: Electronic Signature(s) Signed: 01/21/2021 6:10:01 PM By: Baruch Gouty RN, BSN Entered By: Baruch Gouty on 01/20/2021 10:05:59 -------------------------------------------------------------------------------- Pain Assessment Details Patient Name: Date of Service: [name], [name] 01/20/2021 9:00 A M Medical Record Number: [id number] Patient Account Number: [id number] Date of Birth/Sex: Treating RN: 23-Apr-1941 (80 y.o. Female) Lorrin Jackson Primary Care Abigael Mogle: [place] Nelida Meuse Other Clinician: Referring Taurus Willis: Treating Arlyne Brandes/Extender: [name] [name], [name] in Treatment: 0 Active Problems Location of Pain Severity and Description of Pain Patient Has Paino No Site Locations Pain Management and Medication Current Pain Management: Electronic Signature(s) Signed: 01/20/2021 5:21:11 PM By: Lorrin Jackson Entered By: Lorrin Jackson on 01/20/2021 09:38:53 -------------------------------------------------------------------------------- Patient/Caregiver Education Details Patient Name: Date of Service: [name], MA [place] 6/13/2022andnbsp9:00 [place] Record Number: [id number] Patient Account Number: [id number] Date of Birth/Gender: Treating RN: 06/20/1941 (80 y.o. Female) Baruch Gouty Primary Care Physician: [place] Nelida Meuse Other Clinician: Referring Physician: Treating Physician/Extender: [name] [place]  SSIDY-V Jeani Sow [name] in Treatment: 0 Education Assessment Education Provided To: Patient Education Topics Provided Welcome T [facility]: o Handouts: Welcome T [facility] [place] o Methods: Explain/Verbal, Printed Responses: Reinforcements needed, State content correctly Electronic Signature(s) Signed: 01/21/2021 6:10:01 PM By: Baruch Gouty RN, BSN Entered By: Baruch Gouty on 01/20/2021 10:07:07 -------------------------------------------------------------------------------- Wound Assessment Details Patient Name: Date of Service: [name], [name] 01/20/2021 9:00 A M Medical Record Number: [id number] Patient Account Number: [id number] Date of Birth/Sex: Treating RN: 02/02/1941 (80 y.o. Female) Lorrin Jackson Primary Care Airik Goodlin: [name], LISA Other Clinician: Referring Vernor Monnig: Treating Eirik Schueler/Extender: [name] [name], [name] in Treatment: 0 Wound Status Wound Number: 1 Primary Lymphedema Etiology: Wound Location: Right, Posterior Lower Leg Wound Open Wounding Event: Gradually Appeared Status: Date Acquired: 09/30/2020 Comorbid Lymphedema, Arrhythmia, Congestive Heart Failure, Coronary Weeks Of Treatment: 0 History: Artery Disease, Hypertension, Myocardial Infarction, Gout, Clustered Wound: No Osteoarthritis, Received Chemotherapy Photos Wound Measurements Length: (cm) 1.8 Width: (cm) 2 Depth: (cm) 0.2 Area: (cm) 2.827 Volume: (cm) 0.565 % Reduction in Area: 0% % Reduction in Volume: 0% Epithelialization: None Tunneling: No Undermining: No Wound Description Classification: Full Thickness Without Exposed Support Structures Wound Margin: Well defined, not attached Exudate Amount: Medium Exudate Type: Serosanguineous Exudate Color: red, brown Foul Odor After Cleansing: No Slough/Fibrino Yes Wound Bed Granulation Amount: Large (67-100%) Exposed Structure Granulation Quality: Red Fascia Exposed: No Necrotic  Amount: Small (1-33%) Fat Layer (Subcutaneous Tissue) Exposed: Yes Necrotic Quality: Adherent Slough Tendon Exposed: No Muscle Exposed: No Joint Exposed: No Bone Exposed: No Treatment Notes Wound #1 (Lower Leg) Wound Laterality: Right, Posterior Cleanser Peri-Wound Care Sween Lotion (Moisturizing lotion) Discharge Instruction: Apply moisturizing lotion as directed Topical Primary Dressing KerraCel Ag Gelling Fiber Dressing, 2x2 in (silver alginate) Discharge Instruction: Apply silver alginate to wound bed as instructed Secondary Dressing Woven Gauze Sponge, Non-Sterile 4x4 in Discharge Instruction: Apply over primary dressing as directed. ABD Pad, 5x9 Discharge Instruction: Apply over primary dressing as directed. Secured With Compression Wrap FourPress (4 layer compression wrap) Discharge Instruction: Apply four layer compression as directed. May also use Miliken CoFlex 2 layer compression system as alternative. Compression Stockings Add-Ons Electronic Signature(s) Signed: 01/21/2021 1:59:21 PM By: Lorrin Jackson Signed: 01/22/2021 1:43:18 PM By: Sandre Kitty Previous Signature: 01/20/2021 5:21:11 PM Version By: Lorrin Jackson Entered By: Sandre Kitty on 01/21/2021 12:12:44 -------------------------------------------------------------------------------- [place] Details Patient Name: Date of Service: [name], [name] 01/20/2021 9:00 A M Medical Record Number: [id number] Patient Account Number: [id number] Date of Birth/Sex: Treating RN: 1940/12/23 (80 y.o. Female) Lorrin Jackson Primary Care Jaceon Heiberger: [name], LISA Other Clinician: Referring Furman Trentman: Treating Belem Hintze/Extender: [name] [name], [name] in Treatment: 0 Vital Signs Time Taken: 09:10 Temperature (F): 98 Height (in): 62 Pulse (bpm): 75 Source: Stated Respiratory Rate (breaths/min): 20 Weight (lbs): 268 Blood Pressure (mmHg): 130/74 Source: Stated Reference Range: 80  - 120 mg / dl Body Mass Index (BMI): 49 Electronic Signature(s) Signed: 01/20/2021 5:21:11 PM By: Lorrin Jackson Entered By: Lorrin Jackson on 01/20/2021 09:12:32

## 2021-01-23 ENCOUNTER — Encounter: Payer: Medicare Other | Admitting: Occupational Therapy

## 2021-01-27 ENCOUNTER — Encounter (HOSPITAL_BASED_OUTPATIENT_CLINIC_OR_DEPARTMENT_OTHER): Payer: Medicare Other | Admitting: Internal Medicine

## 2021-01-27 ENCOUNTER — Other Ambulatory Visit: Payer: Self-pay

## 2021-01-27 DIAGNOSIS — L97211 Non-pressure chronic ulcer of right calf limited to breakdown of skin: Secondary | ICD-10-CM | POA: Diagnosis not present

## 2021-01-27 NOTE — Progress Notes (Signed)
Lisa [name], Lisa [name] ([id number]) Visit Report for 01/27/2021 HPI Details Patient Name: Date of Service: Lisa [name], [name] 01/27/2021 1:30 PM Medical Record Number: [id number] Patient Account Number: [id number] Date of Birth/Sex: Treating RN: [birth year] (80 y.o. Lisa [name] Primary Care Provider: [name] Other Clinician: Referring Provider: Treating Provider/Extender: Heidi Dach in Treatment: 1 History of Present Illness HPI Description: ADMISSION 01/20/2021 This [name] a 80 year old woman who tells me that she has had problems with lymphedema since the 1970s. She does not have, or has not been given, a clear explanation for this. She has been followed for many years at various lymphedema clinics including in [place] and then in [place] and now sees Stephani Police at [place]. Developed a wound on the right posterior calf about a month ago. When that happened she [name] no longer able to have lymphedema therapy on this leg. She has been seeing her primary doctor who dressed this with DuoDERM and more recently with an Haematologist. The wounds were also debrided on the posterior The patient has 79 year old compression pumps but I am not sure of the frequency of their use she also has juxta lites but again I am not sure how frequently unreliably the patient [name] using them. She has open wounds on the right posterior and weeping de-epithelialized area predominantly on the left anterior and left anterior lateral. 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 tach and has a cardioverter defibrillator, gout and obstructive sleep apnea. She also has had a melanoma on the right leg and there [name] an extensive scar in the middle of lymphedema on the right anterior lower leg ABIs in our clinic were  noncompressible bilaterally 6/20; patient's wound on the right posterior calf [name] better. She brought in her compression pumps today and says that he still do not fit her leg. She also has external compression stockings at home but she did not bring those in today. We have her in 4-layer compression she [name] actually doing quite well much less swelling in the right leg She has old melanoma surgery on her right leg but she [name] quite adamant that she did not have any radiation of this was back in the 1970s Electronic Signature(s) Signed: 01/27/2021 5:10:27 PM By: [name] MD Entered By: [name] on 01/27/2021 14:47:25 -------------------------------------------------------------------------------- Physical Exam Details Patient Name: Date of Service: Lisa [name], Lisa Lisa S. 01/27/2021 1:30 PM Medical Record Number: [id number] Patient Account Number: [id number] Date of Birth/Sex: Treating RN: 1941-05-02 (80 y.o. Lisa [name] Primary Care Provider: [name] Other Clinician: Referring Provider: Treating Provider/Extender: Heidi Dach in Treatment: 1 Constitutional Patient [name] hypertensive.. Pulse regular and within target range for patient.[name] Respirations regular, non-labored and within target range.. Temperature [name] normal and within the target range for the patient.[name] Appears in no distress. Cardiovascular Pedal pulses palpable.. Notes Wound exam; the patient has a smaller wound on the right posterior calf clean no debridement [name] required. There [name] no evidence of surrounding infection. We have better edema control in her right leg. No evidence of surrounding infection Electronic Signature(s) Signed: 01/27/2021 5:10:27 PM By: [name] MD Entered By: [name] on 01/27/2021 14:48:36 -------------------------------------------------------------------------------- Physician Orders Details Patient Name: Date of Service: [place], Lisa  Lisa S. 01/27/2021 1:30 PM Medical Record Number: [id number] Patient Account Number: [id number] Date  of Birth/Sex: Treating RN: 1940-11-23 (80 y.o. Lisa [name] Primary Care Provider: [name] Other Clinician: Referring Provider: Treating Provider/Extender: Heidi Dach in Treatment: 1 Verbal / Phone Orders: No Diagnosis Coding ICD-10 Coding Code Description I89.0 Lymphedema, not elsewhere classified L97.211 Non-pressure chronic ulcer of right calf limited to breakdown of skin L97.828 Non-pressure chronic ulcer of other part of left lower leg with other specified severity Follow-up Appointments ppointment in 1 week. Lisa [name], Wed, or Thurs with Dr. Dellia Nims Return A Bathing/ Shower/ Hygiene May shower with protection but do not get wound dressing(s) wet. Edema Control - Lymphedema / SCD / Other Bilateral Lower Extremities Lymphedema Pumps. Use Lymphedema pumps on leg(s) 2-3 times a day for 45-60 minutes. If wearing any wraps or hose, do not remove them. Continue exercising as instructed. - use over compression wraps Elevate legs to the level of the heart or above for 30 minutes daily and/or when sitting, a frequency of: - throughout the day Avoid standing for long periods of time. Exercise regularly Non Wound Condition Left Lower Extremity pply the following to affected area as directed: - Apply silver alginate to any weeping areas on left leg, cover with ABDs and 4 layer compression A wrap Wound Treatment Wound #1 - Lower Leg Wound Laterality: Right, Posterior Peri-Wound Care: Sween Lotion (Moisturizing lotion) Discharge Instructions: Apply moisturizing lotion as directed Prim Dressing: KerraCel Ag Gelling Fiber Dressing, 2x2 in (silver alginate) ary Discharge Instructions: Apply silver alginate to wound bed as instructed Secondary Dressing: Woven Gauze Sponge, Non-Sterile 4x4 in Discharge Instructions: Apply over primary dressing as  directed. Secondary Dressing: ABD Pad, 5x9 Discharge Instructions: Apply over primary dressing as directed. Compression Wrap: FourPress (4 layer compression wrap) Discharge Instructions: Apply four layer compression as directed. May also use Miliken CoFlex 2 layer compression system as alternative. Electronic Signature(s) Signed: 01/27/2021 5:10:27 PM By: [name] MD Signed: 01/27/2021 5:37:12 PM By: Levan Hurst RN, BSN Entered By: Levan Hurst on 01/27/2021 14:40:50 -------------------------------------------------------------------------------- Problem List Details Patient Name: Date of Service: Lisa [name], Lisa Lisa S. 01/27/2021 1:30 PM Medical Record Number: [id number] Patient Account Number: [id number] Date of Birth/Sex: Treating RN: [birth year] (80 y.o. Lisa [name] Primary Care Provider: [name] Other Clinician: Referring Provider: Treating Provider/Extender: Heidi Dach in Treatment: 1 Active Problems ICD-10 Encounter Code Description Active Date MDM Diagnosis I89.0 Lymphedema, not elsewhere classified 01/20/2021 No Yes L97.211 Non-pressure chronic ulcer of right calf limited to breakdown of skin 01/20/2021 No Yes L97.828 Non-pressure chronic ulcer of other part of left lower leg with other specified 01/20/2021 No Yes severity Inactive Problems Resolved Problems Electronic Signature(s) Signed: 01/27/2021 5:10:27 PM By: [name] MD Entered By: [name] on 01/27/2021 14:45:48 -------------------------------------------------------------------------------- Progress Note Details Patient Name: Date of Service: Lisa [name], Lisa Lisa S. 01/27/2021 1:30 PM Medical Record Number: [id number] Patient Account Number: [id number] Date of Birth/Sex: Treating RN: 1940-10-04 (80 y.o. Lisa [name] Primary Care Provider: [name] Other Clinician: Referring Provider: Treating Provider/Extender: Heidi Dach in Treatment: 1 Subjective History of Present Illness (HPI) ADMISSION 01/20/2021 This [name] a 80 year old woman who tells me that she has had problems with lymphedema since the 1970s. She does not have, or has not been given, a clear explanation for this. She has been followed for many years at various lymphedema clinics including in [place] and then in [place] and now sees Stephani Police at [place]. Developed a wound on the right posterior calf about  a month ago. When that happened she [name] no longer able to have lymphedema therapy on this leg. She has been seeing her primary doctor who dressed this with DuoDERM and more recently with an Haematologist. The wounds were also debrided on the posterior The patient has 79 year old compression pumps but I am not sure of the frequency of their use she also has juxta lites but again I am not sure how frequently unreliably the patient [name] using them. She has open wounds on the right posterior and weeping de-epithelialized area predominantly on the left anterior and left anterior lateral. 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 tach and has a cardioverter defibrillator, gout and obstructive sleep apnea. She also has had a melanoma on the right leg and there [name] an extensive scar in the middle of lymphedema on the right anterior lower leg ABIs in our clinic were noncompressible bilaterally 6/20; patient's wound on the right posterior calf [name] better. She brought in her compression pumps today and says that he still do not fit her leg. She also has external compression stockings at home but she did not bring those in today. We have her in 4-layer compression she [name] actually doing quite well much less swelling in the right leg She has old melanoma surgery on her  right leg but she [name] quite adamant that she did not have any radiation of this was back in the 1970s Objective Constitutional Patient [name] hypertensive.. Pulse regular and within target range for patient.[name] Respirations regular, non-labored and within target range.. Temperature [name] normal and within the target range for the patient.[name] Appears in no distress. Vitals Time Taken: 2:08 PM, Height: 62 in, Source: Stated, Weight: 268 lbs, Source: Stated, BMI: 49, Temperature: 98 F, Pulse: 68 bpm, Respiratory Rate: 20 breaths/min, Blood Pressure: 156/87 mmHg. Cardiovascular Pedal pulses palpable.. General Notes: Wound exam; the patient has a smaller wound on the right posterior calf clean no debridement [name] required. There [name] no evidence of surrounding infection. We have better edema control in her right leg. No evidence of surrounding infection Integumentary (Hair, Skin) Wound #1 status [name] Open. Original cause of wound was Gradually Appeared. The date acquired was: 09/30/2020. The wound has been in treatment 1 weeks. The wound [name] located on the Right,Posterior Lower Leg. The wound measures 1.4cm length x 1.4cm width x 0.2cm depth; 1.539cm^2 area and 0.308cm^3 volume. There [name] Fat Layer (Subcutaneous Tissue) exposed. There [name] no tunneling or undermining noted. There [name] a small amount of purulent drainage noted. The wound margin [name] distinct with the outline attached to the wound base. There [name] large (67-100%) red granulation within the wound bed. There [name] a small (1-33%) amount of necrotic tissue within the wound bed including Adherent Slough. 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 There was a Four Layer  Compression Therapy Procedure by Levan Hurst, RN. Post procedure Diagnosis Wound #1: Same as Pre-Procedure There was a Four Layer Compression Therapy Procedure by Levan Hurst, RN. Post procedure Diagnosis Wound #: Same as Pre-Procedure Plan Follow-up Appointments: Return Appointment in 1 week. Lisa [name], Wed, or Thurs with  Dr. Dellia Nims Bathing/ Shower/ Hygiene: May shower with protection but do not get wound dressing(s) wet. Edema Control - Lymphedema / SCD / Other: Lymphedema Pumps. Use Lymphedema pumps on leg(s) 2-3 times a day for 45-60 minutes. If wearing any wraps or hose, do not remove them. Continue exercising as instructed. - use over compression wraps Elevate legs to the level of the heart or above for 30 minutes daily and/or when sitting, a frequency of: - throughout the day Avoid standing for long periods of time. Exercise regularly Non Wound Condition: Apply the following to affected area as directed: - Apply silver alginate to any weeping areas on left leg, cover with ABDs and 4 layer compression wrap WOUND #1: - Lower Leg Wound Laterality: Right, Posterior Peri-Wound Care: Sween Lotion (Moisturizing lotion) Discharge Instructions: Apply moisturizing lotion as directed Prim Dressing: KerraCel Ag Gelling Fiber Dressing, 2x2 in (silver alginate) ary Discharge Instructions: Apply silver alginate to wound bed as instructed Secondary Dressing: Woven Gauze Sponge, Non-Sterile 4x4 in Discharge Instructions: Apply over primary dressing as directed. Secondary Dressing: ABD Pad, 5x9 Discharge Instructions: Apply over primary dressing as directed. Com pression Wrap: FourPress (4 layer compression wrap) Discharge Instructions: Apply four layer compression as directed. May also use Miliken CoFlex 2 layer compression system as alternative. 1. We will continue with silver alginate under 4-layer compression 2. I think the patient [name] eligible for new compression pumps she got the ones  that she showed [place] today in 2014 3. I do not know what type of stockings she would be eligible for. I think we will probably send her back to the lymphedema clinic as she gets closer to healing Electronic Signature(s) Signed: 01/27/2021 5:10:27 PM By: [name] MD Entered By: [name] on 01/27/2021 14:49:38 -------------------------------------------------------------------------------- SuperBill Details Patient Name: Date of Service: Lisa [name], Lisa Lisa S. 01/27/2021 Medical Record Number: [id number] Patient Account Number: [id number] Date of Birth/Sex: Treating RN: March 10, 1941 (80 y.o. Lisa [name] Primary Care Provider: [name] Other Clinician: Referring Provider: Treating Provider/Extender: Heidi Dach in Treatment: 1 Diagnosis Coding ICD-10 Codes Code Description I89.0 Lymphedema, not elsewhere classified L97.211 Non-pressure chronic ulcer of right calf limited to breakdown of skin L97.828 Non-pressure chronic ulcer of other part of left lower leg with other specified severity Facility Procedures CPT4: Code [id number] Description: 2 BILATERAL: Application of multi-layer venous compression system; leg (below knee), including ankle and t. Modifier: Quantity: 1 Physician Procedures : CPT4 Code Description Modifier [id number] - WC PHYS LEVEL 3 - EST PT ICD-10 Diagnosis Description I89.0 Lymphedema, not elsewhere classified N02.725 Non-pressure chronic ulcer of right calf limited to breakdown of skin Quantity: 1 Electronic Signature(s) Signed: 01/27/2021 5:10:27 PM By: [name] MD Entered By: [name] on 01/27/2021 14:50:07

## 2021-01-28 ENCOUNTER — Ambulatory Visit: Payer: Medicare Other | Admitting: Occupational Therapy

## 2021-01-29 ENCOUNTER — Ambulatory Visit: Payer: Medicare Other | Admitting: Occupational Therapy

## 2021-01-29 ENCOUNTER — Ambulatory Visit (INDEPENDENT_AMBULATORY_CARE_PROVIDER_SITE_OTHER): Payer: Medicare Other

## 2021-01-29 DIAGNOSIS — I4901 Ventricular fibrillation: Secondary | ICD-10-CM

## 2021-01-30 ENCOUNTER — Telehealth: Payer: Self-pay | Admitting: Emergency Medicine

## 2021-01-30 ENCOUNTER — Ambulatory Visit: Payer: Medicare Other | Admitting: Occupational Therapy

## 2021-01-30 LAB — CUP PACEART REMOTE DEVICE CHECK
Battery Remaining Longevity: 34 mo
Battery Voltage: 2.95 V
Brady Statistic AP VP Percent: 0.27 %
Brady Statistic AP VS Percent: 0.03 %
Brady Statistic AS VP Percent: 98 %
Brady Statistic AS VS Percent: 1.71 %
Brady Statistic RA Percent Paced: 0.3 %
Brady Statistic RV Percent Paced: 0.07 %
Date Time Interrogation Session: 20220622012204
HighPow Impedance: 55 Ohm
Implantable Lead Implant Date: 20170612
Implantable Lead Implant Date: 20170612
Implantable Lead Implant Date: 20170612
Implantable Lead Location: 753858
Implantable Lead Location: 753859
Implantable Lead Location: 753860
Implantable Lead Model: 4398
Implantable Lead Model: 5076
Implantable Pulse Generator Implant Date: 20170612
Lead Channel Impedance Value: 147.097
Lead Channel Impedance Value: 178.125
Lead Channel Impedance Value: 185.366
Lead Channel Impedance Value: 185.581
Lead Channel Impedance Value: 193.455
Lead Channel Impedance Value: 285 Ohm
Lead Channel Impedance Value: 285 Ohm
Lead Channel Impedance Value: 304 Ohm
Lead Channel Impedance Value: 342 Ohm
Lead Channel Impedance Value: 475 Ohm
Lead Channel Impedance Value: 475 Ohm
Lead Channel Impedance Value: 513 Ohm
Lead Channel Impedance Value: 532 Ohm
Lead Channel Impedance Value: 646 Ohm
Lead Channel Impedance Value: 703 Ohm
Lead Channel Impedance Value: 703 Ohm
Lead Channel Impedance Value: 703 Ohm
Lead Channel Impedance Value: 760 Ohm
Lead Channel Pacing Threshold Amplitude: 0.875 V
Lead Channel Pacing Threshold Amplitude: 1 V
Lead Channel Pacing Threshold Amplitude: 1.5 V
Lead Channel Pacing Threshold Pulse Width: 0.4 ms
Lead Channel Pacing Threshold Pulse Width: 0.4 ms
Lead Channel Pacing Threshold Pulse Width: 0.8 ms
Lead Channel Sensing Intrinsic Amplitude: 2.75 mV
Lead Channel Sensing Intrinsic Amplitude: 2.75 mV
Lead Channel Sensing Intrinsic Amplitude: 4.375 mV
Lead Channel Sensing Intrinsic Amplitude: 4.375 mV
Lead Channel Setting Pacing Amplitude: 2 V
Lead Channel Setting Pacing Amplitude: 2.25 V
Lead Channel Setting Pacing Amplitude: 2.5 V
Lead Channel Setting Pacing Pulse Width: 0.4 ms
Lead Channel Setting Pacing Pulse Width: 0.8 ms
Lead Channel Setting Sensing Sensitivity: 0.3 mV

## 2021-01-30 NOTE — Telephone Encounter (Signed)
Per Dr. Jarold Motto has history of VF.  Continue to monitor.  No change.

## 2021-01-30 NOTE — Telephone Encounter (Signed)
  1 HR-NS and 1 NS episode both lasting 1 min @ 200's bpm 01/29/21 HR-NS EGM appears like tor sades in VF zone 270's bpm/11 beats. Scheduled remote. Sending to Dr. Lovena Le to confirm.

## 2021-01-30 NOTE — Progress Notes (Signed)
CHLORIS, Lisa [name] ([id number]) Visit Report for 01/27/2021 Arrival Information Details Patient Name: Date of Service: Lisa [name], [name] 01/27/2021 1:30 PM Medical Record Number: [id number] Patient Account Number: [id number] Date of Birth/Sex: Treating RN: 1940/12/08 (80 y.o. [name] Primary Care Joleena Weisenburger: [name] Other Clinician: Referring Deavion Strider: Treating Brylan Dec/Extender: Heidi Dach in Treatment: 1 Visit Information History Since Last Visit Added or deleted any medications: No Patient Arrived: [name] Any new allergies or adverse reactions: No Arrival Time: 14:05 Had a fall or experienced change in No Accompanied By: self activities of daily living that may affect Transfer Assistance: None risk of falls: Patient Identification Verified: Yes Signs or symptoms of abuse/neglect since last visito No Secondary Verification Process Completed: Yes Hospitalized since last visit: No Patient Requires Transmission-Based Precautions: No Implantable device outside of the clinic excluding No Patient Has Alerts: Yes cellular tissue based products placed in the center Patient Alerts: Patient on Blood Thinner since last visit: Defibrillator Has Dressing in Place as Prescribed: Yes ABI=[place] Bilaterally Has Compression in Place as Prescribed: Yes Pain Present Now: No Electronic Signature(s) Signed: 01/27/2021 6:32:01 PM By: Baruch Gouty RN, BSN Entered By: Baruch Gouty on 01/27/2021 14:08:04 -------------------------------------------------------------------------------- Compression Therapy Details Patient Name: Date of Service: Lisa [name], [name] 01/27/2021 1:30 PM Medical Record Number: [id number] Patient Account Number: [id number] Date of Birth/Sex: Treating RN: Dec 14, 1940 (80 y.o. Lisa [name] Primary Care Hanh Kertesz: [name] Other Clinician: Referring Tristy Udovich: Treating Virna Livengood/Extender: Heidi Dach in Treatment: 1 Compression Therapy Performed for Wound Assessment: Wound #1 Right,Posterior Lower Leg Performed By: Clinician Levan Hurst, RN Compression Type: Four Layer Post Procedure Diagnosis Same as Pre-procedure Electronic Signature(s) Signed: 01/27/2021 5:37:12 PM By: Levan Hurst RN, BSN Entered By: Levan Hurst on 01/27/2021 14:41:35 -------------------------------------------------------------------------------- Compression Therapy Details Patient Name: Date of Service: Lisa [name], [name] 01/27/2021 1:30 PM Medical Record Number: [id number] Patient Account Number: [id number] Date of Birth/Sex: Treating RN: 1940-11-15 (80 y.o. Lisa [name] Primary Care Jentri Aye: [name] Other Clinician: Referring Luz Mares: Treating Mikenzi Raysor/Extender: Heidi Dach in Treatment: 1 Compression Therapy Performed for Wound Assessment: NonWound Condition Lymphedema - Left Leg Performed By: Clinician Levan Hurst, RN Compression Type: Four Layer Post Procedure Diagnosis Same as Pre-procedure Electronic Signature(s) Signed: 01/27/2021 5:37:12 PM By: Levan Hurst RN, BSN Entered By: Levan Hurst on 01/27/2021 14:41:46 -------------------------------------------------------------------------------- Lower Extremity Assessment Details Patient Name: Date of Service: Lisa [name], [name] 01/27/2021 1:30 PM Medical Record Number: [id number] Patient Account Number: [id number] Date of Birth/Sex: Treating RN: 1940-09-07 (80 y.o. Lisa [name], Linda Primary Care [name]: [name] Other Clinician: Referring Amor Hyle: Treating Jaydalynn Olivero/Extender: Heidi Dach in Treatment: 1 Edema Assessment Assessed: [name]: No] [name]: No] Edema: [Left: Yes] [Right: Yes] Calf Left: Right: Point of Measurement: 30 cm From Medial Instep 62.5 cm 67.3 cm Ankle Left: Right: Point of Measurement:  6 cm From Medial Instep 30.3 cm 31.5 cm Knee To Floor Left: Right: From Medial Instep 41 cm 41 cm Vascular Assessment Pulses: Dorsalis Pedis Palpable: [Left:Yes] [Right:Yes] Electronic Signature(s) Signed: 01/27/2021 5:37:12 PM By: Levan Hurst RN, BSN Signed: 01/27/2021 6:32:01 PM By: Baruch Gouty RN, BSN Entered By: Levan Hurst on 01/27/2021 14:43:30 -------------------------------------------------------------------------------- Multi Wound Chart Details Patient Name: Date of Service: Lisa [name], [name] 01/27/2021 1:30 PM Medical Record Number: [id number] Patient Account Number: [id number] Date of Birth/Sex: Treating RN: 04-Nov-1940 (80 y.o. Lisa [name] Primary Care [name]:  Cassidy-Vu, Lattie Haw Other Clinician: Referring Hiroyuki Ozanich: Treating Adryan Shin/Extender: Heidi Dach in Treatment: 1 Vital Signs Height(in): 31 Pulse(bpm): 28 Weight(lbs): 2 Blood Pressure(mmHg): 156/87 Body Mass Index(BMI): 96 Temperature(F): 98 Respiratory Rate(breaths/min): 26 Photos: [1:No Photos Right, Posterior Lower Leg] [N/A:N/A N/A] Wound Location: [1:Gradually Appeared] [N/A:N/A] Wounding Event: [1:Lymphedema] [N/A:N/A] Primary Etiology: [1:Lymphedema, Arrhythmia, Congestive N/A] Comorbid History: [1:Heart Failure, Coronary Artery Disease, Hypertension, Myocardial Infarction, Gout, Osteoarthritis, Received Chemotherapy 09/30/2020] [N/A:N/A] Date Acquired: [1:1] [N/A:N/A] Weeks of Treatment: [1:Open] [N/A:N/A] Wound Status: [1:1.4x1.4x0.2] [N/A:N/A] Measurements L x W x D (cm) [1:1.539] [N/A:N/A] A (cm) : rea [1:0.308] [N/A:N/A] Volume (cm) : [1:45.60%] [N/A:N/A] % Reduction in Area: [1:45.50%] [N/A:N/A] % Reduction in Volume: [1:Full Thickness Without Exposed] [N/A:N/A] Classification: [1:Support Structures Small] [N/A:N/A] Exudate Amount: [1:Purulent] [N/A:N/A] Exudate Type: [1:yellow, brown, green] [N/A:N/A] Exudate Color: [1:Distinct,  outline attached] [N/A:N/A] Wound Margin: [1:Large (67-100%)] [N/A:N/A] Granulation Amount: [1:Red] [N/A:N/A] Granulation Quality: [1:Small (1-33%)] [N/A:N/A] Necrotic Amount: [1:Fat Layer (Subcutaneous Tissue): Yes N/A] Exposed Structures: [1:Fascia: No Tendon: No Muscle: No Joint: No Bone: No Small (1-33%)] [N/A:N/A] Epithelialization: [1:Compression Therapy] [N/A:N/A] Treatment Notes Electronic Signature(s) Signed: 01/27/2021 5:10:27 PM By: [name] MD Signed: 01/27/2021 5:37:12 PM By: Levan Hurst RN, BSN Entered By: [name] on 01/27/2021 14:45:54 -------------------------------------------------------------------------------- Multi-Disciplinary Care Plan Details Patient Name: Date of Service: Lisa [name], [name] 01/27/2021 1:30 PM Medical Record Number: [id number] Patient Account Number: [id number] Date of Birth/Sex: Treating RN: [birth year] (80 y.o. Lisa [name] Primary Care Azai Gaffin: [name] Other Clinician: Referring Feras Gardella: Treating Analei Whinery/Extender: Heidi Dach in Treatment: 1 Multidisciplinary Care Plan reviewed with physician Active Inactive Venous Leg Ulcer Nursing Diagnoses: Knowledge deficit related to disease process and management Potential for venous Insuffiency (use before diagnosis confirmed) Goals: Patient will maintain optimal edema control Date Initiated: 01/20/2021 Target Resolution Date: 02/17/2021 Goal Status: Active Interventions: Assess peripheral edema status every visit. Compression as ordered Treatment Activities: Therapeutic compression applied : 01/20/2021 Notes: Wound/Skin Impairment Nursing Diagnoses: Impaired tissue integrity Knowledge deficit related to ulceration/compromised skin integrity Goals: Patient/caregiver will verbalize understanding of skin care regimen Date Initiated: 01/20/2021 Target Resolution Date: 02/17/2021 Goal Status: Active Ulcer/skin breakdown will  have a volume reduction of 30% by week 4 Date Initiated: 01/20/2021 Target Resolution Date: 02/17/2021 Goal Status: Active Interventions: Assess patient/caregiver ability to obtain necessary supplies Assess patient/caregiver ability to perform ulcer/skin care regimen upon admission and as needed Assess ulceration(s) every visit Provide education on ulcer and skin care Treatment Activities: Skin care regimen initiated : 01/20/2021 Topical wound management initiated : 01/20/2021 Notes: Electronic Signature(s) Signed: 01/27/2021 5:37:12 PM By: Levan Hurst RN, BSN Entered By: Levan Hurst on 01/27/2021 16:13:35 -------------------------------------------------------------------------------- Pain Assessment Details Patient Name: Date of Service: Lisa [name], [name] 01/27/2021 1:30 PM Medical Record Number: [id number] Patient Account Number: [id number] Date of Birth/Sex: Treating RN: [birth year] (80 y.o. [name] Primary Care Sarabeth Benton: [name] Other Clinician: Referring Emilene Roma: Treating Jamiesha Victoria/Extender: Heidi Dach in Treatment: 1 Active Problems Location of Pain Severity and Description of Pain Patient Has Paino No Site Locations Rate the pain. Current Pain Level: 0 Pain Management and Medication Current Pain Management: Electronic Signature(s) Signed: 01/27/2021 6:32:01 PM By: Baruch Gouty RN, BSN Entered By: Baruch Gouty on 01/27/2021 14:23:30 -------------------------------------------------------------------------------- Patient/Caregiver Education Details Patient Name: Date of Service: Lisa [name], MA [name] 6/20/2022andnbsp1:30 PM Medical Record Number: [id number] Patient Account Number: [id number] Date of Birth/Gender: Treating RN: 1941/06/27 (80 y.o. Lisa [name] Primary Care Physician:  Cassidy-Vu, Lattie Haw Other Clinician: Referring Physician: Treating Physician/Extender: Heidi Dach in Treatment: 1 Education Assessment Education Provided To: Patient Education Topics Provided Wound/Skin Impairment: Methods: Explain/Verbal Responses: State content correctly Electronic Signature(s) Signed: 01/27/2021 5:37:12 PM By: Levan Hurst RN, BSN Entered By: Levan Hurst on 01/27/2021 16:13:51 -------------------------------------------------------------------------------- Wound Assessment Details Patient Name: Date of Service: Lisa [name], [name] 01/27/2021 1:30 PM Medical Record Number: [id number] Patient Account Number: [id number] Date of Birth/Sex: Treating RN: [birth year] (80 y.o. [name] Primary Care Noe Goyer: [name] Other Clinician: Referring Ericia Moxley: Treating Trammell Bowden/Extender: Heidi Dach in Treatment: 1 Wound Status Wound Number: 1 Primary Lymphedema Etiology: Wound Location: Right, Posterior Lower Leg Wound Open Wounding Event: Gradually Appeared Status: Date Acquired: 09/30/2020 Comorbid Lymphedema, Arrhythmia, Congestive Heart Failure, Coronary Weeks Of Treatment: 1 History: Artery Disease, Hypertension, Myocardial Infarction, Gout, Clustered Wound: No Osteoarthritis, Received Chemotherapy Photos Wound Measurements Length: (cm) 1.4 Width: (cm) 1.4 Depth: (cm) 0.2 Area: (cm) 1.539 Volume: (cm) 0.308 % Reduction in Area: 45.6% % Reduction in Volume: 45.5% Epithelialization: Small (1-33%) Tunneling: No Undermining: No Wound Description Classification: Full Thickness Without Exposed Support Structures Wound Margin: Distinct, outline attached Exudate Amount: Small Exudate Type: Purulent Exudate Color: yellow, brown, green Foul Odor After Cleansing: No Slough/Fibrino Yes Wound Bed Granulation Amount: Large (67-100%) Exposed Structure Granulation Quality: Red Fascia Exposed: No Necrotic Amount: Small (1-33%) Fat Layer (Subcutaneous Tissue) Exposed: Yes Necrotic Quality:  Adherent Slough Tendon Exposed: No Muscle Exposed: No Joint Exposed: No Bone Exposed: No Electronic Signature(s) Signed: 01/27/2021 6:32:01 PM By: Baruch Gouty RN, BSN Signed: 01/30/2021 8:38:46 AM By: Leane Call Entered By: Leane Call on 01/27/2021 16:30:14 -------------------------------------------------------------------------------- Vitals Details Patient Name: Date of Service: Lisa [name], [name] 01/27/2021 1:30 PM Medical Record Number: [id number] Patient Account Number: [id number] Date of Birth/Sex: Treating RN: [birth year] (80 y.o. [name] Primary Care Latondra Gebhart: [name] Other Clinician: Referring Oma Alpert: Treating Flo Berroa/Extender: Heidi Dach in Treatment: 1 Vital Signs Time Taken: 14:08 Temperature (F): 98 Height (in): 62 Pulse (bpm): 68 Source: Stated Respiratory Rate (breaths/min): 20 Weight (lbs): 268 Blood Pressure (mmHg): 156/87 Source: Stated Reference Range: 80 - 120 mg / dl Body Mass Index (BMI): 49 Electronic Signature(s) Signed: 01/27/2021 6:32:01 PM By: Baruch Gouty RN, BSN Entered By: Baruch Gouty on 01/27/2021 14:11:39

## 2021-01-31 ENCOUNTER — Other Ambulatory Visit: Payer: Self-pay

## 2021-01-31 ENCOUNTER — Encounter (HOSPITAL_BASED_OUTPATIENT_CLINIC_OR_DEPARTMENT_OTHER): Payer: Medicare Other | Admitting: Internal Medicine

## 2021-01-31 DIAGNOSIS — L97211 Non-pressure chronic ulcer of right calf limited to breakdown of skin: Secondary | ICD-10-CM | POA: Diagnosis not present

## 2021-01-31 NOTE — Progress Notes (Signed)
Lisa [name], Lisa [name] ([id number]) Visit Report for 01/31/2021 Arrival Information Details Patient Name: Date of Service: Lisa [name], [name] 01/31/2021 2:15 PM Medical Record Number: [id number] Patient Account Number: [id number] Date of Birth/Sex: Treating RN: 1940/12/20 (80 y.o. [name] Primary Care Kamen Hanken: [name] Other Clinician: Referring Nidia Grogan: Treating Rainey Kahrs/Extender: Heidi Dach in Treatment: 1 Visit Information History Since Last Visit Added or deleted any medications: No Patient Arrived: Ambulatory Any new allergies or adverse reactions: No Arrival Time: 14:41 Had a fall or experienced change in No Accompanied By: self activities of daily living that may affect Transfer Assistance: None risk of falls: Patient Identification Verified: Yes Signs or symptoms of abuse/neglect since last visito No Secondary Verification Process Completed: Yes Implantable device outside of the clinic excluding No Patient Requires Transmission-Based Precautions: No cellular tissue based products placed in the center Patient Has Alerts: Yes since last visit: Patient Alerts: Patient on Blood Thinner Has Dressing in Place as Prescribed: Yes Defibrillator Pain Present Now: No ABI=[place] Bilaterally Electronic Signature(s) Signed: 01/31/2021 5:07:58 PM By: Sandre Kitty Entered By: Sandre Kitty on 01/31/2021 14:42:01 -------------------------------------------------------------------------------- Compression Therapy Details Patient Name: Date of Service: Lisa [name], Lisa Lisa S. 01/31/2021 2:15 PM Medical Record Number: [id number] Patient Account Number: [id number] Date of Birth/Sex: Treating RN: 03/07/1941 (80 y.o. [name] Primary Care Jazziel Fitzsimmons: [name] Other Clinician: Referring Sadae Arrazola: Treating Charels Stambaugh/Extender: Heidi Dach in Treatment: 1 Compression Therapy Performed for Wound  Assessment: Wound #1 Right,Posterior Lower Leg Performed By: Clinician [name], RN Compression Type: Four Layer Electronic Signature(s) Signed: 01/31/2021 6:02:43 PM By: [name] Entered By: [name] on 01/31/2021 14:47:17 -------------------------------------------------------------------------------- Encounter Discharge Information Details Patient Name: Date of Service: Lisa V [name], Lisa Lisa S. 01/31/2021 2:15 PM Medical Record Number: [id number] Patient Account Number: [id number] Date of Birth/Sex: Treating RN: 10/17/1940 (80 y.o. [name] Primary Care Anita Laguna: [name] Other Clinician: Referring Karol Liendo: Treating Enaya Howze/Extender: Heidi Dach in Treatment: 1 Encounter Discharge Information Items Discharge Condition: Stable Ambulatory Status: Cane Discharge Destination: Home Transportation: Private Auto Accompanied By: self Schedule Follow-up Appointment: Yes Clinical Summary of Care: Electronic Signature(s) Signed: 01/31/2021 6:02:43 PM By: [name] Entered By: [name] on 01/31/2021 14:47:48 -------------------------------------------------------------------------------- Patient/Caregiver Education Details Patient Name: Date of Service: Lisa [name], Lisa [name] 6/24/2022andnbsp2:15 PM Medical Record Number: [id number] Patient Account Number: [id number] Date of Birth/Gender: Treating RN: [birth year] (80 y.o. [name]: Referring Physician: Treating Physician/Extender: Heidi Dach in Treatment: 1 Education Assessment Education Provided To: Patient Education Topics Provided Wound/Skin Impairment: Handouts: Skin Care Do's and Dont's Methods: Explain/Verbal Responses: Reinforcements needed Electronic Signature(s) Signed: 01/31/2021 6:02:43 PM By: [name] Entered By: [name] on 01/31/2021  14:47:36 -------------------------------------------------------------------------------- Wound Assessment Details Patient Name: Date of Service: Lisa Clayton Bibles [name], Lisa Lisa S. 01/31/2021 2:15 PM Medical Record Number: [id number] Patient Account Number: [id number] Date of Birth/Sex: Treating RN: Nov 22, 1940 (80 y.o. [name] Primary Care Delorise Hunkele: [name] Other Clinician: Referring Nafisa Olds: Treating Ayad Nieman/Extender: Heidi Dach in Treatment: 1 Wound Status Wound Number: 1 Primary Etiology: Lymphedema Wound Location: Right, Posterior Lower Leg Wound Status: Open Wounding Event: Gradually Appeared Date Acquired: 09/30/2020 Weeks Of Treatment: 1 Clustered Wound: No Wound Measurements Length: (cm) 1.4 Width: (cm) 1.4 Depth: (cm) 0.2 Area: (cm) 1.539 Volume: (cm) 0.308 % Reduction in Area: 45.6% % Reduction in Volume: 45.5% Wound Description Classification: Full  Thickness Without Exposed Support Structur es Treatment Notes Wound #1 (Lower Leg) Wound Laterality: Right, Posterior Cleanser Peri-Wound Care Sween Lotion (Moisturizing lotion) Discharge Instruction: Apply moisturizing lotion as directed Topical Primary Dressing KerraCel Ag Gelling Fiber Dressing, 2x2 in (silver alginate) Discharge Instruction: Apply silver alginate to wound bed as instructed Secondary Dressing Woven Gauze Sponge, Non-Sterile 4x4 in Discharge Instruction: Apply over primary dressing as directed. ABD Pad, 5x9 Discharge Instruction: Apply over primary dressing as directed. Secured With Compression Wrap FourPress (4 layer compression wrap) Discharge Instruction: Apply four layer compression as directed. May also use Miliken CoFlex 2 layer compression system as alternative. Compression Stockings Add-Ons Electronic Signature(s) Signed: 01/31/2021 5:07:58 PM By: Sandre Kitty Signed: 01/31/2021 6:17:32 PM By: Baruch Gouty RN, BSN Entered By: Sandre Kitty on 01/31/2021 14:42:24 -------------------------------------------------------------------------------- Vitals Details Patient Name: Date of Service: Lisa V [name], Lisa Lisa S. 01/31/2021 2:15 PM Medical Record Number: [id number] Patient Account Number: [id number] Date of Birth/Sex: Treating RN: 10/07/1940 (80 y.o. [name] Primary Care Blu Lori: [name] Other Clinician: Referring Kaivon Livesey: Treating Elliot Meldrum/Extender: Heidi Dach in Treatment: 1 Vital Signs Time Taken: 14:42 Temperature (F): 98.0 Height (in): 62 Pulse (bpm): 68 Weight (lbs): 268 Respiratory Rate (breaths/min): 20 Body Mass Index (BMI): 49 Blood Pressure (mmHg): 156/87 Reference Range: 80 - 120 mg / dl Electronic Signature(s) Signed: 01/31/2021 5:07:58 PM By: Sandre Kitty Entered By: Sandre Kitty on 01/31/2021 14:42:15

## 2021-01-31 NOTE — Progress Notes (Signed)
LATRELLE, FUSTON ([id number]) Visit Report for 01/31/2021 SuperBill Details Patient Name: Date of Service: [name], [name] 01/31/2021 Medical Record Number: [id number] Patient Account Number: [id number] Date of Birth/Sex: Treating RN: 29-Aug-1940 (80 y.o. [name], [name] Primary Care Provider: [name] Other Clinician: Referring Provider: Treating Provider/Extender: Heidi Dach in Treatment: 1 Diagnosis Coding ICD-10 Codes Code Description I89.0 Lymphedema, not elsewhere classified L97.211 Non-pressure chronic ulcer of right calf limited to breakdown of skin L97.828 Non-pressure chronic ulcer of other part of left lower leg with other specified severity Facility Procedures CPT4 Code Description Modifier Quantity [phone] (Facility Use Only) [phone] - APPLY MULTLAY COMPRS LWR RT LEG 1 Electronic Signature(s) Signed: 01/31/2021 5:20:40 PM By: [name] MD Signed: 01/31/2021 6:02:43 PM By: [name] Entered By: [name] on 01/31/2021 14:48:01

## 2021-02-04 ENCOUNTER — Ambulatory Visit: Payer: Medicare Other | Attending: Family Medicine | Admitting: Occupational Therapy

## 2021-02-05 ENCOUNTER — Encounter: Payer: Medicare Other | Admitting: Occupational Therapy

## 2021-02-05 ENCOUNTER — Ambulatory Visit: Payer: Medicare Other | Admitting: Occupational Therapy

## 2021-02-05 ENCOUNTER — Encounter (HOSPITAL_BASED_OUTPATIENT_CLINIC_OR_DEPARTMENT_OTHER): Payer: Medicare Other | Admitting: Physician Assistant

## 2021-02-05 ENCOUNTER — Other Ambulatory Visit: Payer: Self-pay

## 2021-02-05 DIAGNOSIS — L97211 Non-pressure chronic ulcer of right calf limited to breakdown of skin: Secondary | ICD-10-CM | POA: Diagnosis not present

## 2021-02-05 NOTE — Progress Notes (Signed)
Lisa, [name] ([id number]) Visit Report for 02/05/2021 HPI Details Patient Name: Date of Service: Lisa [name], [name] 02/05/2021 12:30 PM Medical Record Number: [id number] Patient Account Number: [id number] Date of Birth/Sex: Treating RN: 28-Sep-1940 (80 y.o. Lisa [name] Primary Care Provider: [name] Other Clinician: Referring Provider: Treating Provider/Extender: Heidi Dach in Treatment: 2 History of Present Illness HPI Description: ADMISSION 01/20/2021 This [name] a 80 year old woman who tells me that she has had problems with lymphedema since the 1970s. She does not have, or has not been given, a clear explanation for this. She has been followed for many years at various lymphedema clinics including in [place] and then in [place] and now sees Stephani Police at [place]. Developed a wound on the right posterior calf about a month ago. When that happened she [name] no longer able to have lymphedema therapy on this leg. She has been seeing her primary doctor who dressed this with DuoDERM and more recently with an Haematologist. The wounds were also debrided on the posterior The patient has 79 year old compression pumps but I am not sure of the frequency of their use she also has juxta lites but again I am not sure how frequently unreliably the patient [name] using them. She has open wounds on the right posterior and weeping de-epithelialized area predominantly on the left anterior and left anterior lateral. 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 tach and has a cardioverter defibrillator, gout and obstructive sleep apnea. She also has had a melanoma on the right leg and there [name] an extensive scar in the middle of lymphedema on the right anterior lower leg ABIs in our clinic were  noncompressible bilaterally 6/20; patient's wound on the right posterior calf [name] better. She brought in her compression pumps today and says that he still do not fit her leg. She also has external compression stockings at home but she did not bring those in today. We have her in 4-layer compression she [name] actually doing quite well much less swelling in the right leg She has old melanoma surgery on her right leg but she [name] quite adamant that she did not have any radiation of this was back in the 1970s 6/29; right posterior calf wound. This appears somewhat dry but looks as though it [name] attempting to epithelialize. We have better edema control. We have somebody coming out to go over her compression pumps with her on 7/12. Electronic Signature(s) Signed: 02/05/2021 4:58:19 PM By: [name] MD Entered By: [name] on 02/05/2021 13:23:39 -------------------------------------------------------------------------------- Physical Exam Details Patient Name: Date of Service: Lisa [name], [name] 02/05/2021 12:30 PM Medical Record Number: [id number] Patient Account Number: [id number] Date of Birth/Sex: Treating RN: 02/04/1941 (80 y.o. Lisa [name] Primary Care Provider: [name] Other Clinician: Referring Provider: Treating Provider/Extender: Heidi Dach in Treatment: 2 Constitutional Sitting or standing Blood Pressure [name] within target range for patient.. Pulse regular and within target range for patient.Lisa [name] Respirations regular, non-labored and within target range.. Temperature [name] normal and within the target range for the patient.Lisa [name] Appears in no distress. Notes Wound exam; the patient wound [name] measuring smaller by several millimeters. It looks very clean under illumination and somewhat dry but I think it [name] attempting to epithelialize. We have much better edema control bilaterally Electronic Signature(s) Signed: 02/05/2021 4:58:19 PM By:  [name] MD Entered By: [name] on 02/05/2021 13:25:55 -------------------------------------------------------------------------------- Physician Orders Details Patient Name: Date of Service: Lisa [name], [name] 02/05/2021 12:30 PM Medical Record Number: [id number] Patient Account Number: [id number] Date of Birth/Sex: Treating RN: October 03, 1940 (80 y.o. Lisa [name] Primary Care Provider: [name] Other Clinician: Referring Provider: Treating Provider/Extender: Heidi Dach in Treatment: 2 Verbal / Phone Orders: No Diagnosis Coding ICD-10 Coding Code Description I89.0 Lymphedema, not elsewhere classified L97.211 Non-pressure chronic ulcer of right calf limited to breakdown of skin L97.828 Non-pressure chronic ulcer of other part of left lower leg with other specified severity Follow-up Appointments ppointment in 1 week. - with Dr. Dellia Nims Return A Bathing/ Shower/ Hygiene May shower with protection but do not get wound dressing(s) wet. Edema Control - Lymphedema / SCD / Other Bilateral Lower Extremities Lymphedema Pumps. Use Lymphedema pumps on leg(s) 2-3 times a day for 45-60 minutes. If wearing any wraps or hose, do not remove them. Continue exercising as instructed. - use over compression wraps Elevate legs to the level of the heart or above for 30 minutes daily and/or when sitting, a frequency of: - throughout the day Avoid standing for long periods of time. Exercise regularly Non Wound Condition Left Lower Extremity pply the following to affected area as directed: - Apply silver alginate to any weeping areas on left leg, cover with ABDs and 4 layer compression A wrap Wound Treatment Wound #1 - Lower Leg Wound Laterality: Right, Posterior Peri-Wound Care: Sween Lotion (Moisturizing lotion) Discharge Instructions: Apply moisturizing lotion as directed Prim Dressing: Hydrofera Blue Ready Foam, 2.5 x2.5 in ary Discharge  Instructions: Apply to wound bed as instructed Secondary Dressing: Woven Gauze Sponge, Non-Sterile 4x4 in Discharge Instructions: Apply over primary dressing as directed. Secondary Dressing: ABD Pad, 5x9 Discharge Instructions: Apply over primary dressing as directed. Compression Wrap: FourPress (4 layer compression wrap) Discharge Instructions: Apply four layer compression as directed. May also use Miliken CoFlex 2 layer compression system as alternative. Electronic Signature(s) Signed: 02/05/2021 4:56:05 PM By: Levan Hurst RN, BSN Signed: 02/05/2021 4:58:19 PM By: [name] MD Entered By: Levan Hurst on 02/05/2021 13:13:37 -------------------------------------------------------------------------------- Problem List Details Patient Name: Date of Service: Lisa [name], [name] 02/05/2021 12:30 PM Medical Record Number: [id number] Patient Account Number: [id number] Date of Birth/Sex: Treating RN: 04/22/1941 (80 y.o. Lisa [name] Primary Care Provider: [name] Other Clinician: Referring Provider: Treating Provider/Extender: Heidi Dach in Treatment: 2 Active Problems ICD-10 Encounter Code Description Active Date MDM Diagnosis I89.0 Lymphedema, not elsewhere classified 01/20/2021 No Yes L97.211 Non-pressure chronic ulcer of right calf limited to breakdown of skin 01/20/2021 No Yes L97.828 Non-pressure chronic ulcer of other part of left lower leg with other specified 01/20/2021 No Yes severity Inactive Problems Resolved Problems Electronic Signature(s) Signed: 02/05/2021 4:58:19 PM By: [name] MD Entered By: [name] on 02/05/2021 13:22:21 -------------------------------------------------------------------------------- Progress Note Details Patient Name: Date of Service: Lisa [name], [name] 02/05/2021 12:30 PM Medical Record Number: [id number] Patient Account Number: [id number] Date of Birth/Sex: Treating  RN: 1941/04/27 (80 y.o. Lisa [name] Primary Care Provider: [name] Other Clinician: Referring Provider: Treating Provider/Extender: Heidi Dach in Treatment: 2 Subjective History of Present Illness (HPI) ADMISSION 01/20/2021 This [name] a 80 year old woman who tells me that she has had problems with lymphedema since the 1970s. She does not have, or has not been given, a clear explanation for this. She has been followed  for many years at various lymphedema clinics including in [place] and then in [place] and now sees Stephani Police at [place]. Developed a wound on the right posterior calf about a month ago. When that happened she [name] no longer able to have lymphedema therapy on this leg. She has been seeing her primary doctor who dressed this with DuoDERM and more recently with an Haematologist. The wounds were also debrided on the posterior The patient has 79 year old compression pumps but I am not sure of the frequency of their use she also has juxta lites but again I am not sure how frequently unreliably the patient [name] using them. She has open wounds on the right posterior and weeping de-epithelialized area predominantly on the left anterior and left anterior lateral. 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 tach and has a cardioverter defibrillator, gout and obstructive sleep apnea. She also has had a melanoma on the right leg and there [name] an extensive scar in the middle of lymphedema on the right anterior lower leg ABIs in our clinic were noncompressible bilaterally 6/20; patient's wound on the right posterior calf [name] better. She brought in her compression pumps today and says that he still do not fit her leg. She also has external compression stockings at home but she did not bring  those in today. We have her in 4-layer compression she [name] actually doing quite well much less swelling in the right leg She has old melanoma surgery on her right leg but she [name] quite adamant that she did not have any radiation of this was back in the 1970s 6/29; right posterior calf wound. This appears somewhat dry but looks as though it [name] attempting to epithelialize. We have better edema control. We have somebody coming out to go over her compression pumps with her on 7/12. Objective Constitutional Sitting or standing Blood Pressure [name] within target range for patient.. Pulse regular and within target range for patient.Lisa [name] Respirations regular, non-labored and within target range.. Temperature [name] normal and within the target range for the patient.Lisa [name] Appears in no distress. Vitals Time Taken: 12:38 PM, Height: 62 in, Weight: 268 lbs, BMI: 49, Temperature: 98.2 F, Pulse: 69 bpm, Respiratory Rate: 20 breaths/min, Blood Pressure: 146/66 mmHg. General Notes: Wound exam; the patient wound [name] measuring smaller by several millimeters. It looks very clean under illumination and somewhat dry but I think it [name] attempting to epithelialize. We have much better edema control bilaterally Integumentary (Hair, Skin) Wound #1 status [name] Open. Original cause of wound was Gradually Appeared. The date acquired was: 09/30/2020. The wound has been in treatment 2 weeks. The wound [name] located on the Right,Posterior Lower Leg. The wound measures 1.1cm length x 1.2cm width x 0.1cm depth; 1.037cm^2 area and 0.104cm^3 volume. There [name] Fat Layer (Subcutaneous Tissue) exposed. There [name] no tunneling or undermining noted. There [name] a medium amount of serosanguineous drainage noted. The wound margin [name] flat and intact. There [name] large (67-100%) red granulation within the wound bed. There [name] a small (1-33%) amount of necrotic tissue within the wound bed including Adherent Slough. 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 There was a Four Layer Compression Therapy Procedure by Levan Hurst, RN. Post procedure Diagnosis Wound #1: Same as Pre-Procedure There was a Four Layer Compression Therapy Procedure by Levan Hurst, RN. Post procedure Diagnosis Wound #: Same as Pre-Procedure Plan Follow-up Appointments: Return Appointment in 1 week. - with Dr. Arcola Jansky Shower/ Hygiene: May shower with protection but do not get wound dressing(s) wet. Edema Control - Lymphedema / SCD / Other: Lymphedema Pumps. Use Lymphedema pumps on leg(s) 2-3 times a day for 45-60 minutes. If wearing any wraps or hose, do not remove them. Continue exercising as instructed. - use over compression wraps Elevate legs to the level of the heart or above for 30 minutes daily and/or when sitting, a frequency of: - throughout the day Avoid standing for long periods of time. Exercise regularly Non Wound Condition: Apply the following to affected area as directed: - Apply silver alginate to any weeping areas on left leg, cover with ABDs and 4 layer compression wrap WOUND #1: - Lower Leg Wound Laterality: Right, Posterior Peri-Wound Care: Sween Lotion (Moisturizing lotion) Discharge Instructions: Apply moisturizing lotion as directed Prim Dressing: Hydrofera Blue Ready Foam, 2.5 x2.5 in ary Discharge Instructions: Apply to wound bed as instructed Secondary Dressing: Woven Gauze Sponge, Non-Sterile 4x4 in Discharge Instructions: Apply over primary dressing as directed. Secondary Dressing: ABD Pad, 5x9 Discharge Instructions: Apply over primary dressing as directed. Com pression Wrap: FourPress (4 layer compression wrap) Discharge  Instructions: Apply four layer compression as directed. May also use Miliken CoFlex 2 layer compression system as alternative. 1. I change the primary dressing to [facility] still under the same compression 2. She [name] canceling appointments for the lymphedema clinic in [place] and I told her another 2 or 3 weeks before we can get her back in there. 3. She [name] going to have new compression pumps shown to her by a vendor on 7/12. Electronic Signature(s) Signed: 02/05/2021 4:58:19 PM By: [name] MD Entered By: [name] on 02/05/2021 13:26:56 -------------------------------------------------------------------------------- SuperBill Details Patient Name: Date of Service: Lisa [name], [name] 02/05/2021 Medical Record Number: [id number] Patient Account Number: [id number] Date of Birth/Sex: Treating RN: April 14, 1941 (80 y.o. Lisa [name] Primary Care Provider: [name] Other Clinician: Referring Provider: Treating Provider/Extender: Heidi Dach in Treatment: 2 Diagnosis Coding ICD-10 Codes Code Description I89.0 Lymphedema, not elsewhere classified L97.211 Non-pressure chronic ulcer of right calf limited to breakdown of skin L97.828 Non-pressure chronic ulcer of other part of left lower leg with other specified severity Facility Procedures CPT4: Code [id number] Description: 70 BILATERAL: Application of multi-layer venous compression system; leg (below knee), including ankle and t. Modifier: Quantity: 1 Physician Procedures : CPT4 Code Description Modifier [id number] - WC PHYS LEVEL 3 - EST PT ICD-10 Diagnosis Description I89.0 Lymphedema, not elsewhere classified N39.767 Non-pressure chronic ulcer of right calf limited to breakdown of skin Quantity: 1 Electronic Signature(s) Signed: 02/05/2021 4:58:19 PM By: [name] MD Entered By: [name] on 02/05/2021 13:27:18

## 2021-02-05 NOTE — Progress Notes (Signed)
Lisa, Lisa [name] ([id number]) Visit Report for 02/05/2021 Arrival Information Details Patient Name: Date of Service: [name] IS, [name] 02/05/2021 12:30 PM Medical Record Number: [id number] Patient Account Number: [id number] Date of Birth/Sex: Treating RN: Nov 04, 1940 (80 y.o. Lisa [name] Primary Care Lisa [name]: Lisa [name] Other Clinician: Referring Lisa [name]: Treating Lisa [name]/Extender: Lisa [name]: 2 Visit Information History Since Last Visit Added or deleted any medications: No Patient Arrived: Lisa [name] Any new allergies or adverse reactions: No Arrival Time: 12:38 Had a fall or experienced change in No Accompanied By: self activities of daily living that may affect Transfer Assistance: None risk of falls: Patient Identification Verified: Yes Signs or symptoms of abuse/neglect since last visito No Secondary Verification Process Completed: Yes Hospitalized since last visit: No Patient Requires Transmission-Based Precautions: No Implantable device outside of the clinic excluding No Patient Has Alerts: Yes cellular tissue based products placed in the center Patient Alerts: Patient on Blood Thinner since last visit: Defibrillator Has Dressing in Place as Prescribed: Yes ABI=[place] Bilaterally Pain Present Now: No Electronic Signature(s) Signed: 02/05/2021 5:02:46 PM By: Lisa [name] Entered By: Lisa [name] on 02/05/2021 12:38:37 -------------------------------------------------------------------------------- Compression Therapy Details Patient Name: Date of Service: [name], [name] Lisa S. 02/05/2021 12:30 PM Medical Record Number: [id number] Patient Account Number: [id number] Date of Birth/Sex: Treating RN: 1940-10-15 (80 y.o. Lisa [name] Primary Care Lisa [name]: Lisa [name] Other Clinician: Referring Lisa [name]: Treating Lisa [name]/Extender: Lisa [name]: 2 Compression  Therapy Performed for Wound Assessment: Wound #1 Right,Posterior Lower Leg Performed By: Clinician Lisa Hurst, RN Compression Type: Four Layer Post Procedure Diagnosis Same as Pre-procedure Electronic Signature(s) Signed: 02/05/2021 4:56:05 PM By: Lisa [name] Entered By: Lisa [name] on 02/05/2021 13:11:32 -------------------------------------------------------------------------------- Compression Therapy Details Patient Name: Date of Service: [name], [name] Lisa S. 02/05/2021 12:30 PM Medical Record Number: [id number] Patient Account Number: [id number] Date of Birth/Sex: Treating RN: April 05, 1941 (80 y.o. Lisa [name] Primary Care Oluwaseyi Tull: Lisa [name] Other Clinician: Referring Lisa [name]: Treating Stayce Delancy/Extender: Lisa [name]: 2 Compression Therapy Performed for Wound Assessment: NonWound Condition Lymphedema - Left Leg Performed By: Clinician Lisa Hurst, RN Compression Type: Four Layer Post Procedure Diagnosis Same as Pre-procedure Electronic Signature(s) Signed: 02/05/2021 4:56:05 PM By: Lisa [name] Entered By: Lisa [name] on 02/05/2021 13:13:03 -------------------------------------------------------------------------------- Encounter Discharge Information Details Patient Name: Date of Service: [name] Lisa S. 02/05/2021 12:30 PM Medical Record Number: [id number] Patient Account Number: [id number] Date of Birth/Sex: Treating RN: [birth year] (80 y.o. Lisa [name] Primary Care Lisa [name]: Lisa [name] Other Clinician: Referring Lisa [name]: Treating Lisa [name]/Extender: Lisa [name]: 2 Encounter Discharge Information Items Discharge Condition: Stable Ambulatory Status: Ambulatory Discharge Destination: Home Transportation: Private Auto Accompanied By: self Schedule Follow-up Appointment: Yes Clinical Summary of Care: Electronic  Signature(s) Signed: 02/05/2021 6:10:10 PM By: Lisa [name] Entered By: Lisa [name] on 02/05/2021 14:53:13 -------------------------------------------------------------------------------- Lower Extremity Assessment Details Patient Name: Date of Service: [name] Lisa S. 02/05/2021 12:30 PM Medical Record Number: [id number] Patient Account Number: [id number] Date of Birth/Sex: Treating RN: 25-Jun-1941 (80 y.o. Lisa [name] Primary Care Lisa [name]: Lisa [name] Other Clinician: Referring Lisa [name]: Treating Lisa [name]/Extender: Lisa [name]: 2 Edema Assessment Assessed: Lisa [name]: No] [Right: No] Edema: [Left: Yes] [Right: Yes] Calf Left: Right: Point of Measurement: 30 cm From Medial Instep 63 cm 67 cm Ankle Left: Right: Point of Measurement: 6 cm From Medial Instep 31  cm 34 cm Vascular Assessment Pulses: Dorsalis Pedis Palpable: [Left:Yes] [Right:Yes] Electronic Signature(s) Signed: 02/05/2021 4:56:05 PM By: Lisa [name] Entered By: Lisa [name] on 02/05/2021 12:57:01 -------------------------------------------------------------------------------- Multi Wound Chart Details Patient Name: Date of Service: [name] Lisa S. 02/05/2021 12:30 PM Medical Record Number: [id number] Patient Account Number: [id number] Date of Birth/Sex: Treating RN: 1940/09/26 (80 y.o. Lisa [name] Primary Care Lisa [name]: Lisa [name] Other Clinician: Referring Lisa [name]: Treating Lisa [name]/Extender: Lisa [name]: 2 Vital Signs Height(in): 62 Pulse(bpm): 3 Weight(lbs): 544 Blood Pressure(mmHg): 146/66 Body Mass Index(BMI): 49 Temperature(F): 98.2 Respiratory Rate(breaths/min): 20 Photos: [1:No Photos Right, Posterior Lower Leg] [N/A:N/A N/A] Wound Location: [1:Gradually Appeared] [N/A:N/A] Wounding Event: [1:Lymphedema] [N/A:N/A] Primary Etiology: [1:Lymphedema, Arrhythmia,  Congestive N/A] Comorbid History: [1:Heart Failure, Coronary Artery Disease, Hypertension, Myocardial Infarction, Gout, Osteoarthritis, Received Chemotherapy 09/30/2020] [N/A:N/A] Date Acquired: [1:2] [N/A:N/A] Weeks of [name]: [1:Open] [N/A:N/A] Wound Status: [1:1.1x1.2x0.1] [N/A:N/A] Measurements L x W x D (cm) [1:1.037] [N/A:N/A] A (cm) : rea [1:0.104] [N/A:N/A] Volume (cm) : [1:63.30%] [N/A:N/A] % Reduction in Area: [1:81.60%] [N/A:N/A] % Reduction in Volume: [1:Full Thickness Without Exposed] [N/A:N/A] Classification: [1:Support Structures Medium] [N/A:N/A] Exudate Amount: [1:Serosanguineous] [N/A:N/A] Exudate Type: [1:red, brown] [N/A:N/A] Exudate Color: [1:Flat and Intact] [N/A:N/A] Wound Margin: [1:Large (67-100%)] [N/A:N/A] Granulation Amount: [1:Red] [N/A:N/A] Granulation Quality: [1:Small (1-33%)] [N/A:N/A] Necrotic Amount: [1:Fat Layer (Subcutaneous Tissue): Yes N/A] Exposed Structures: [1:Fascia: No Tendon: No Muscle: No Joint: No Bone: No Small (1-33%)] [N/A:N/A] Epithelialization: [1:Compression Therapy] [N/A:N/A] Procedures Performed: [name] Notes Electronic Signature(s) Signed: 02/05/2021 4:56:05 PM By: Lisa [name] Signed: 02/05/2021 4:58:19 PM By: [name] MD Entered By: [name] on 02/05/2021 13:22:27 -------------------------------------------------------------------------------- Multi-Disciplinary Care Plan Details Patient Name: Date of Service: [name] Lisa S. 02/05/2021 12:30 PM Medical Record Number: [id number] Patient Account Number: [id number] Date of Birth/Sex: Treating RN: 1941/08/03 (80 y.o. Lisa [name] Primary Care Nicholos Aloisi: Lisa [name] Other Clinician: Referring Skylinn Vialpando: Treating Maicol Bowland/Extender: Lisa [name]: 2 Multidisciplinary Care Plan reviewed with physician Active Inactive Venous Leg Ulcer Nursing Diagnoses: Knowledge deficit related to disease  process and management Potential for venous Insuffiency (use before diagnosis confirmed) Goals: Patient will maintain optimal edema control Date Initiated: 01/20/2021 Target Resolution Date: 02/17/2021 Goal Status: Active Interventions: Assess peripheral edema status every visit. Compression as ordered [name] Activities: Therapeutic compression applied : 01/20/2021 Notes: Wound/Skin Impairment Nursing Diagnoses: Impaired tissue integrity Knowledge deficit related to ulceration/compromised skin integrity Goals: Patient/caregiver will verbalize understanding of skin care regimen Date Initiated: 01/20/2021 Target Resolution Date: 02/17/2021 Goal Status: Active Ulcer/skin breakdown will have a volume reduction of 30% by week 4 Date Initiated: 01/20/2021 Target Resolution Date: 02/17/2021 Goal Status: Active Interventions: Assess patient/caregiver ability to obtain necessary supplies Assess patient/caregiver ability to perform ulcer/skin care regimen upon admission and as needed Assess ulceration(s) every visit Provide education on ulcer and skin care [name] Activities: Skin care regimen initiated : 01/20/2021 Topical wound management initiated : 01/20/2021 Notes: Electronic Signature(s) Signed: 02/05/2021 4:56:05 PM By: Lisa [name] Entered By: Lisa [name] on 02/05/2021 13:10:47 -------------------------------------------------------------------------------- Pain Assessment Details Patient Name: Date of Service: [name], [name] Lisa S. 02/05/2021 12:30 PM Medical Record Number: [id number] Patient Account Number: [id number] Date of Birth/Sex: Treating RN: August 21, 1940 (80 y.o. Lisa [name] Primary Care Lonnie Rosado: Lisa [name] Other Clinician: Referring Kaitlynd Phillips: Treating Fantashia Shupert/Extender: Lisa [name]: 2 Active Problems Location of Pain Severity and Description of Pain Patient Has Paino No Site Locations  Pain  Management and Medication Current Pain Management: Electronic Signature(s) Signed: 02/05/2021 4:56:05 PM By: Lisa [name] Signed: 02/05/2021 5:02:46 PM By: Lisa [name] Entered By: Lisa [name] on 02/05/2021 12:38:58 -------------------------------------------------------------------------------- Patient/Caregiver Education Details Patient Name: Date of Service: [name], [name] Lisa S. 6/29/2022andnbsp12:30 PM Medical Record Number: [id number] Patient Account Number: [id number] Date of Birth/Gender: Treating RN: 17-Mar-1941 (80 y.o. Lisa [name] Primary Care Physician: Lisa [name] Other Clinician: Referring Physician: Treating Physician/Extender: Lisa [name]: 2 Education Assessment Education Provided To: Patient Education Topics Provided Wound/Skin Impairment: Methods: Explain/Verbal Responses: State content correctly Electronic Signature(s) Signed: 02/05/2021 4:56:05 PM By: Lisa [name] Entered By: Lisa [name] on 02/05/2021 13:11:04 -------------------------------------------------------------------------------- Wound Assessment Details Patient Name: Date of Service: [name], [name] Lisa S. 02/05/2021 12:30 PM Medical Record Number: [id number] Patient Account Number: [id number] Date of Birth/Sex: Treating RN: 18-Dec-1940 (80 y.o. Lisa [name] Primary Care Alaiyah Bollman: Lisa [name] Other Clinician: Referring Sashia Campas: Treating Vernis Eid/Extender: Lisa [name]: 2 Wound Status Wound Number: 1 Primary Lymphedema Etiology: Wound Location: Right, Posterior Lower Leg Wound Open Wounding Event: Gradually Appeared Status: Date Acquired: 09/30/2020 Comorbid Lymphedema, Arrhythmia, Congestive Heart Failure, Coronary Weeks Of [name]: 2 History: Artery Disease, Hypertension, Myocardial Infarction, Gout, Clustered Wound: No Osteoarthritis, Received  Chemotherapy Photos Wound Measurements Length: (cm) 1.1 Width: (cm) 1.2 Depth: (cm) 0.1 Area: (cm) 1.037 Volume: (cm) 0.104 % Reduction in Area: 63.3% % Reduction in Volume: 81.6% Epithelialization: Small (1-33%) Tunneling: No Undermining: No Wound Description Classification: Full Thickness Without Exposed Support Structures Wound Margin: Flat and Intact Exudate Amount: Medium Exudate Type: Serosanguineous Exudate Color: red, brown Foul Odor After Cleansing: No Slough/Fibrino No Wound Bed Granulation Amount: Large (67-100%) Exposed Structure Granulation Quality: Red Fascia Exposed: No Necrotic Amount: Small (1-33%) Fat Layer (Subcutaneous Tissue) Exposed: Yes Necrotic Quality: Adherent Slough Tendon Exposed: No Muscle Exposed: No Joint Exposed: No Bone Exposed: No [name] Notes Wound #1 (Lower Leg) Wound Laterality: Right, Posterior Cleanser Peri-Wound Care Sween Lotion (Moisturizing lotion) Discharge Instruction: Apply moisturizing lotion as directed Topical Primary Dressing Hydrofera Blue Ready Foam, 2.5 x2.5 in Discharge Instruction: Apply to wound bed as instructed Secondary Dressing Woven Gauze Sponge, Non-Sterile 4x4 in Discharge Instruction: Apply over primary dressing as directed. ABD Pad, 5x9 Discharge Instruction: Apply over primary dressing as directed. Secured With Compression Wrap FourPress (4 layer compression wrap) Discharge Instruction: Apply four layer compression as directed. May also use [place] CoFlex 2 layer compression system as alternative. Compression Stockings Add-Ons Electronic Signature(s) Signed: 02/05/2021 4:56:05 PM By: Lisa [name] Signed: 02/05/2021 5:02:46 PM By: Lisa [name] Entered By: Lisa [name] on 02/05/2021 15:49:03 -------------------------------------------------------------------------------- [place] Details Patient Name: Date of Service: [name] Lisa S. 02/05/2021 12:30 PM Medical  Record Number: [id number] Patient Account Number: [id number] Date of Birth/Sex: Treating RN: 1940-09-30 (80 y.o. Lisa [name] Primary Care Gretna Bergin: Lisa [name] Other Clinician: Referring Avereigh Spainhower: Treating Taren Toops/Extender: Lisa [name]: 2 Vital Signs Time Taken: 12:38 Temperature (F): 98.2 Height (in): 62 Pulse (bpm): 69 Weight (lbs): 268 Respiratory Rate (breaths/min): 20 Body Mass Index (BMI): 49 Blood Pressure (mmHg): 146/66 Reference Range: 80 - 120 mg / dl Electronic Signature(s) Signed: 02/05/2021 5:02:46 PM By: Lisa [name] Entered By: Lisa [name] on 02/05/2021 12:38:52

## 2021-02-07 ENCOUNTER — Encounter: Payer: Medicare Other | Admitting: Occupational Therapy

## 2021-02-11 ENCOUNTER — Ambulatory Visit: Payer: Medicare Other | Admitting: Occupational Therapy

## 2021-02-12 ENCOUNTER — Other Ambulatory Visit: Payer: Self-pay

## 2021-02-12 ENCOUNTER — Encounter: Payer: Medicare Other | Admitting: Occupational Therapy

## 2021-02-12 ENCOUNTER — Encounter (HOSPITAL_BASED_OUTPATIENT_CLINIC_OR_DEPARTMENT_OTHER): Payer: Medicare Other | Attending: Internal Medicine | Admitting: Internal Medicine

## 2021-02-12 DIAGNOSIS — Z95810 Presence of automatic (implantable) cardiac defibrillator: Secondary | ICD-10-CM | POA: Insufficient documentation

## 2021-02-12 DIAGNOSIS — M109 Gout, unspecified: Secondary | ICD-10-CM | POA: Diagnosis not present

## 2021-02-12 DIAGNOSIS — L97828 Non-pressure chronic ulcer of other part of left lower leg with other specified severity: Secondary | ICD-10-CM | POA: Insufficient documentation

## 2021-02-12 DIAGNOSIS — L03116 Cellulitis of left lower limb: Secondary | ICD-10-CM | POA: Insufficient documentation

## 2021-02-12 DIAGNOSIS — L905 Scar conditions and fibrosis of skin: Secondary | ICD-10-CM | POA: Diagnosis not present

## 2021-02-12 DIAGNOSIS — Z85820 Personal history of malignant melanoma of skin: Secondary | ICD-10-CM | POA: Diagnosis not present

## 2021-02-12 DIAGNOSIS — I2510 Atherosclerotic heart disease of native coronary artery without angina pectoris: Secondary | ICD-10-CM | POA: Insufficient documentation

## 2021-02-12 DIAGNOSIS — I5022 Chronic systolic (congestive) heart failure: Secondary | ICD-10-CM | POA: Diagnosis not present

## 2021-02-12 DIAGNOSIS — G4733 Obstructive sleep apnea (adult) (pediatric): Secondary | ICD-10-CM | POA: Diagnosis not present

## 2021-02-12 DIAGNOSIS — C9210 Chronic myeloid leukemia, BCR/ABL-positive, not having achieved remission: Secondary | ICD-10-CM | POA: Insufficient documentation

## 2021-02-12 DIAGNOSIS — I890 Lymphedema, not elsewhere classified: Secondary | ICD-10-CM | POA: Insufficient documentation

## 2021-02-12 DIAGNOSIS — N183 Chronic kidney disease, stage 3 unspecified: Secondary | ICD-10-CM | POA: Insufficient documentation

## 2021-02-12 DIAGNOSIS — L97211 Non-pressure chronic ulcer of right calf limited to breakdown of skin: Secondary | ICD-10-CM | POA: Diagnosis not present

## 2021-02-12 NOTE — Progress Notes (Signed)
[name], [name] ([id number]) Visit Report for 02/12/2021 Arrival Information Details Patient Name: Date of Service: Lisa [name], [name] 02/12/2021 12:30 PM Medical Record Number: [id number] Patient Account Number: [id number] Date of Birth/Sex: Treating RN: 12/29/1940 (80 y.o. Lisa [name] Primary Care Lisa [name]: Lisa [name] Other Clinician: Referring Lisa [name]: Treating Lisa [name]/Extender: Lisa [name] in Treatment: 3 Visit Information History Since Last Visit Added or deleted any medications: No Patient Arrived: Lisa [name] Any new allergies or adverse reactions: No Arrival Time: 12:50 Had a fall or experienced change in No Accompanied By: self activities of daily living that may affect Transfer Assistance: None risk of falls: Patient Identification Verified: Yes Signs or symptoms of abuse/neglect since last visito No Secondary Verification Process Completed: Yes Hospitalized since last visit: No Patient Requires Transmission-Based Precautions: No Implantable device outside of the clinic excluding No Patient Has Alerts: Yes cellular tissue based products placed in the center Patient Alerts: Patient on Blood Thinner since last visit: Defibrillator Has Dressing in Place as Prescribed: Yes ABI=[place] Bilaterally Pain Present Now: No Electronic Signature(s) Signed: 02/12/2021 5:22:20 PM By: Lisa [name] Entered By: Lisa [name] on 02/12/2021 12:50:27 -------------------------------------------------------------------------------- Compression Therapy Details Patient Name: Date of Service: Lisa Lisa Hammans, [name] 02/12/2021 12:30 PM Medical Record Number: [id number] Patient Account Number: [id number] Date of Birth/Sex: Treating RN: [birth year] (80 y.o. Lisa [name] Primary Care Kelsi Benham: Lisa [name] Other Clinician: Referring Lisa [name]: Treating Lisa [name]/Extender: Lisa [name] in Treatment: 3 Compression Therapy  Performed for Wound Assessment: Wound #1 Right,Posterior Lower Leg Performed By: Clinician Lisa Pilling, RN Compression Type: Four Layer Electronic Signature(s) Signed: 02/12/2021 6:50:03 PM By: Lisa [name] Entered By: Lisa [name] on 02/12/2021 17:48:12 -------------------------------------------------------------------------------- Compression Therapy Details Patient Name: Date of Service: Lisa Lisa Hammans, [name] 02/12/2021 12:30 PM Medical Record Number: [id number] Patient Account Number: [id number] Date of Birth/Sex: Treating RN: Apr 08, 1941 (80 y.o. Lisa [name] Primary Care Kamsiyochukwu Buist: Other Clinician: Tilman [name] Referring Lisa [name]: Treating Lisa [name]/Extender: Lisa [name] in Treatment: 3 Compression Therapy Performed for Wound Assessment: NonWound Condition Lymphedema - Left Leg Performed By: Clinician Lisa Pilling, RN Compression Type: Four Layer Electronic Signature(s) Signed: 02/12/2021 6:50:03 PM By: Lisa [name] Entered By: Lisa [name] on 02/12/2021 17:48:43 -------------------------------------------------------------------------------- Encounter Discharge Information Details Patient Name: Date of Service: Lisa [name], [name] 02/12/2021 12:30 PM Medical Record Number: [id number] Patient Account Number: [id number] Date of Birth/Sex: Treating RN: 1941-06-12 (80 y.o. Lisa [name] Primary Care Lisa [name]: Lisa [name] Other Clinician: Referring Lisa [name]: Treating Lisa [name]/Extender: Lisa [name] in Treatment: 3 Encounter Discharge Information Items Discharge Condition: Stable Ambulatory Status: Cane Discharge Destination: Home Transportation: Private Auto Accompanied By: self Schedule Follow-up Appointment: Yes Clinical Summary of Care: Electronic Signature(s) Signed: 02/12/2021 6:50:03 PM By: Lisa [name] Entered By: Lisa [name] on 02/12/2021  17:49:33 -------------------------------------------------------------------------------- Patient/Caregiver Education Details Patient Name: Date of Service: Lisa Lisa Hammans, MA Lisa [name] 7/6/2022andnbsp12:30 PM Medical Record Number: [id number] Patient Account Number: [id number] Date of Birth/Gender: Treating RN: Jul 02, 1941 (80 y.o. Lisa [name] Primary Care Physician: Lisa [name] Other Clinician: Referring Physician: Treating Physician/Extender: Lisa [name] in Treatment: 3 Education Assessment Education Provided To: Patient Education Topics Provided Wound/Skin Impairment: Handouts: Skin Care Do's and Dont's Methods: Explain/Verbal Responses: Reinforcements needed Electronic Signature(s) Signed: 02/12/2021 6:50:03 PM By: Lisa [name] Entered By: Lisa [name] on 02/12/2021 17:49:09 -------------------------------------------------------------------------------- Wound Assessment Details Patient Name: Date of Service: Lisa [name], [name] 02/12/2021 12:30 PM Medical  Record Number: [id number] Patient Account Number: [id number] Date of Birth/Sex: Treating RN: 03-May-1941 (80 y.o. Lisa [name] Primary Care Lisa [name]: Lisa [name] Other Clinician: Referring Lisa [name]: Treating Lisa [name]/Extender: Lisa [name] in Treatment: 3 Wound Status Wound Number: 1 Primary Etiology: Lymphedema Wound Location: Right, Posterior Lower Leg Wound Status: Open Wounding Event: Gradually Appeared Date Acquired: 09/30/2020 Weeks Of Treatment: 3 Clustered Wound: No Wound Measurements Length: (cm) 1.1 Width: (cm) 1.2 Depth: (cm) 0.1 Area: (cm) 1.037 Volume: (cm) 0.104 % Reduction in Area: 63.3% % Reduction in Volume: 81.6% Wound Description Classification: Full Thickness Without Exposed Support Structur es Treatment Notes Wound #1 (Lower Leg) Wound Laterality: Right, Posterior Cleanser Peri-Wound Care Sween Lotion  (Moisturizing lotion) Discharge Instruction: Apply moisturizing lotion as directed Topical Primary Dressing Hydrofera Blue Ready Foam, 2.5 x2.5 in Discharge Instruction: Apply to wound bed as instructed Secondary Dressing Woven Gauze Sponge, Non-Sterile 4x4 in Discharge Instruction: Apply over primary dressing as directed. ABD Pad, 5x9 Discharge Instruction: Apply over primary dressing as directed. Secured With Compression Wrap FourPress (4 layer compression wrap) Discharge Instruction: Apply four layer compression as directed. May also use Miliken CoFlex 2 layer compression system as alternative. Compression Stockings Add-Ons Electronic Signature(s) Signed: 02/12/2021 5:22:20 PM By: Lisa [name] Signed: 02/12/2021 7:11:13 PM By: Levan Hurst RN, BSN Entered By: Lisa [name] on 02/12/2021 12:50:54 -------------------------------------------------------------------------------- [place] Details Patient Name: Date of Service: Lisa [name], [name] 02/12/2021 12:30 PM Medical Record Number: [id number] Patient Account Number: [id number] Date of Birth/Sex: Treating RN: 1940-11-06 (80 y.o. Lisa [name] Primary Care Gor Vestal: Lisa [name] Other Clinician: Referring Lizzette Carbonell: Treating Ivan Maskell/Extender: Lisa [name] in Treatment: 3 Vital Signs Time Taken: 12:50 Temperature (F): 98.6 Height (in): 62 Pulse (bpm): 64 Weight (lbs): 268 Respiratory Rate (breaths/min): 20 Body Mass Index (BMI): 49 Blood Pressure (mmHg): 152/49 Reference Range: 80 - 120 mg / dl Electronic Signature(s) Signed: 02/12/2021 5:22:20 PM By: Lisa [name] Entered By: Lisa [name] on 02/12/2021 12:50:43

## 2021-02-13 ENCOUNTER — Ambulatory Visit: Payer: Medicare Other | Admitting: Occupational Therapy

## 2021-02-13 NOTE — Progress Notes (Signed)
[name], [name] ([id number]) Visit Report for 02/12/2021 Super[place] Details Patient Name: Date of Service: Lisa [name], Lisa [name]. 02/12/2021 Medical Record Number: [id number] Patient Account Number: [id number] Date of Birth/Sex: Treating RN: 1941-07-20 (80 y.o. 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 Electronic Signature([name]) Signed: 02/12/2021 6:50:03 PM By: [name] Signed: 02/13/2021 8:35:52 PM By: [name] MD Entered By: [name] on 02/12/2021 17:49:41

## 2021-02-14 ENCOUNTER — Ambulatory Visit: Payer: Medicare Other | Admitting: Occupational Therapy

## 2021-02-17 ENCOUNTER — Ambulatory Visit: Payer: Medicare Other | Admitting: Occupational Therapy

## 2021-02-18 ENCOUNTER — Other Ambulatory Visit: Payer: Self-pay

## 2021-02-18 ENCOUNTER — Encounter (HOSPITAL_BASED_OUTPATIENT_CLINIC_OR_DEPARTMENT_OTHER): Payer: Medicare Other | Admitting: Internal Medicine

## 2021-02-18 DIAGNOSIS — L97211 Non-pressure chronic ulcer of right calf limited to breakdown of skin: Secondary | ICD-10-CM | POA: Diagnosis not present

## 2021-02-18 NOTE — Progress Notes (Signed)
Lisa [name], Lisa [name] ([id number]) Visit Report for 02/18/2021 SuperBill Details Patient Name: Date of Service: [name], [name] 02/18/2021 Medical Record Number: [id number] Patient Account Number: [id number] Date of Birth/Sex: Treating RN: 1941/08/01 (80 y.o. 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

## 2021-02-18 NOTE — Progress Notes (Signed)
Remote ICD transmission.   

## 2021-02-18 NOTE — Progress Notes (Signed)
Lisa [name], CHOO ([id number]) Visit Report for 02/18/2021 Arrival Information Details Patient Name: Date of Service: Lisa [name], [name] 02/18/2021 9:15 A M Medical Record Number: [id number] Patient Account Number: [id number] Date of Birth/Sex: Treating RN: October 17, 1940 (80 y.o. Lisa [name] Primary Care Cristofer Yaffe: [name] Other Clinician: Referring Josanna Hefel: Treating [place]akin[place] Rosamond/Extender: [name] in Treatment: 4 Visit Information History Since Last Visit Added or deleted any medications: No Patient Arrived: Ambulatory Any new allergies or adverse reactions: No Arrival Time: 09:20 Had a fall or experienced change in No Accompanied By: self activities of daily living that may affect Transfer Assistance: None risk of falls: Patient Identification Verified: Yes Signs or symptoms of abuse/neglect since last visito No Secondary Verification Process Completed: Yes Hospitalized since last visit: No Patient Requires Transmission-Based Precautions: No Implantable device outside of the clinic excluding No Patient Has Alerts: Yes cellular tissue based products placed in the center Patient Alerts: Patient on Blood Thinner since last visit: Defibrillator Has Dressing in Place as Prescribed: Yes ABI=[place] Bilaterally Pain Present Now: No Electronic Signature(s) Signed: 02/18/2021 2:49:47 PM By: Sandre Kitty Entered By: Sandre Kitty on 02/18/2021 09:20:19 -------------------------------------------------------------------------------- Compression Therapy Details Patient Name: Date of Service: Lisa Lisa Hammans, [name] 02/18/2021 9:15 A M Medical Record Number: [id number] Patient Account Number: [id number] Date of Birth/Sex: Treating RN: 24-Mar-1941 (80 y.o. Lisa [name] Primary Care Foye Damron: [name] Other Clinician: Referring Mitsy Owen: Treating Marai Teehan/Extender: [name] in Treatment:  4 Compression Therapy Performed for Wound Assessment: NonWound Condition Lymphedema - Left Leg Performed By: Clinician Levan Hurst, RN Compression Type: Four Layer Electronic Signature(s) Signed: 02/18/2021 5:43:47 PM By: Levan Hurst RN, BSN Entered By: Levan Hurst on 02/18/2021 10:47:30 -------------------------------------------------------------------------------- Encounter Discharge Information Details Patient Name: Date of Service: Lisa [name], [name] 02/18/2021 9:15 A M Medical Record Number: [id number] Patient Account Number: [id number] Date of Birth/Sex: Treating RN: 08-Apr-1941 (80 y.o. Lisa [name] Primary Care Dorothye Berni: Other Clinician: [name] Referring Ebany Bowermaster: Treating Noma Quijas/Extender: [name] in Treatment: 4 Encounter Discharge Information Items Discharge Condition: Stable Ambulatory Status: Cane Discharge Destination: Home Transportation: Private Auto Accompanied By: alone Schedule Follow-up Appointment: Yes Clinical Summary of Care: Patient Declined Electronic Signature(s) Signed: 02/18/2021 5:43:47 PM By: Levan Hurst RN, BSN Entered By: Levan Hurst on 02/18/2021 10:48:11 -------------------------------------------------------------------------------- Wound Assessment Details Patient Name: Date of Service: Lisa [name], [name] 02/18/2021 9:15 A M Medical Record Number: [id number] Patient Account Number: [id number] Date of Birth/Sex: Treating RN: August 12, 1940 (80 y.o. Lisa [name] Primary Care Adaliah Hiegel: [name] Other Clinician: Referring Seryna Marek: Treating Alysiah Suppa/Extender: [name] in Treatment: 4 Wound Status Wound Number: 1 Primary Etiology: Lymphedema Wound Location: Right, Posterior Lower Leg Wound Status: Open Wounding Event: Gradually Appeared Date Acquired: 09/30/2020 Weeks Of Treatment: 4 Clustered Wound: No Wound  Measurements Length: (cm) 1.1 Width: (cm) 1.2 Depth: (cm) 0.1 Area: (cm) 1.037 Volume: (cm) 0.104 % Reduction in Area: 63.3% % Reduction in Volume: 81.6% Wound Description Classification: Full Thickness Without Exposed Support Structur es Treatment Notes Wound #1 (Lower Leg) Wound Laterality: Right, Posterior Cleanser Peri-Wound Care Topical Primary Dressing Secondary Dressing Secured With Compression Wrap Compression Stockings Add-Ons Electronic Signature(s) Signed: 02/18/2021 2:49:47 PM By: Sandre Kitty Signed: 02/18/2021 5:43:47 PM By: Levan Hurst RN, BSN Entered By: Sandre Kitty on 02/18/2021 09:20:45 -------------------------------------------------------------------------------- Vitals Details Patient Name: Date of Service: Lisa [name], [name] 02/18/2021 9:15 A M Medical Record Number:  [id number] Patient Account Number: [id number] Date of Birth/Sex: Treating RN: 04-11-1941 (80 y.o. Lisa [name] Primary Care Draven Laine: [name] Other Clinician: Referring Zyere Jiminez: Treating Apryll Hinkle/Extender: [name] in Treatment: 4 Vital Signs Time Taken: 09:20 Temperature (F): 98.4 Height (in): 62 Pulse (bpm): 73 Weight (lbs): 268 Respiratory Rate (breaths/min): 20 Body Mass Index (BMI): 49 Blood Pressure (mmHg): 149/71 Reference Range: 80 - 120 mg / dl Electronic Signature(s) Signed: 02/18/2021 2:49:47 PM By: Sandre Kitty Entered By: Sandre Kitty on 02/18/2021 09:20:35

## 2021-02-19 ENCOUNTER — Ambulatory Visit: Payer: Medicare Other | Admitting: Occupational Therapy

## 2021-02-20 ENCOUNTER — Encounter (HOSPITAL_BASED_OUTPATIENT_CLINIC_OR_DEPARTMENT_OTHER): Payer: Medicare Other | Admitting: Internal Medicine

## 2021-02-21 ENCOUNTER — Other Ambulatory Visit: Payer: Self-pay

## 2021-02-21 ENCOUNTER — Encounter (HOSPITAL_BASED_OUTPATIENT_CLINIC_OR_DEPARTMENT_OTHER): Payer: Medicare Other | Admitting: Internal Medicine

## 2021-02-21 DIAGNOSIS — L97211 Non-pressure chronic ulcer of right calf limited to breakdown of skin: Secondary | ICD-10-CM | POA: Diagnosis not present

## 2021-02-21 NOTE — Progress Notes (Signed)
Lisa [name] ([id number]) Visit Report for 02/21/2021 Chief Complaint Document Details Patient Name: Date of Service: Lisa [name], [name] 02/21/2021 10:15 A M Medical Record Number: [id number] Patient Account Number: [id number] Date of Birth/Sex: Treating RN: Jan 04, 1941 (80 y.o. Lisa [name] Primary Care Provider: Tilman [name] Other Clinician: Referring Provider: Treating Provider/Extender: [name] in Treatment: 4 Information Obtained from: Patient Chief Complaint 01/20/2021; patient is here for review of the wound on the right posterior calf however also has weeping de-epithelialized areas on the left leg as well in the setting of severe bilateral lymphedema Electronic Signature(s) Signed: 02/21/2021 12:36:28 PM By: Kalman Shan DO Entered By: Kalman Shan on 02/21/2021 12:14:50 -------------------------------------------------------------------------------- HPI Details Patient Name: Date of Service: Lisa [name], [name] 02/21/2021 10:15 A M Medical Record Number: [id number] Patient Account Number: [id number] Date of Birth/Sex: Treating RN: June 25, 1941 (80 y.o. Lisa [name] Primary Care Provider: Tilman [name] Other Clinician: Referring Provider: Treating Provider/Extender: [name] in Treatment: 4 History of Present Illness HPI Description: ADMISSION 01/20/2021 This is a 80 year old woman who tells me that she has had problems with lymphedema since the 1970s. She does not have, or has not been given, a clear explanation for this. She has been followed for many years at various lymphedema clinics including in [place] and then in [place] and now sees Lisa [name] at [place]. Developed a wound on the right posterior calf about a month ago. When that happened she is no longer able to have lymphedema therapy on this leg. She has been seeing her primary doctor who dressed this with  DuoDERM and more recently with an Haematologist. The wounds were also debrided on the posterior The patient has 79 year old compression pumps but I am not sure of the frequency of their use she also has juxta lites but again I am not sure how frequently unreliably the patient is using them. She has open wounds on the right posterior and weeping de-epithelialized area predominantly on the left anterior and left anterior lateral. 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 tach and has a cardioverter defibrillator, gout and obstructive sleep apnea. She also has had a melanoma on the right leg and there is an extensive scar in the middle of lymphedema on the right anterior lower leg ABIs in our clinic were noncompressible bilaterally 6/20; patient's wound on the right posterior calf is better. She brought in her compression pumps today and says that he still do not fit her leg. She also has external compression stockings at home but she did not bring those in today. We have her in 4-layer compression she is actually doing quite well much less swelling in the right leg She has old melanoma surgery on her right leg but she is quite adamant that she did not have any radiation of this was back in the 1970s 6/29; right posterior calf wound. This appears somewhat dry but looks as though it is attempting to epithelialize. We have better edema control. We have somebody coming out to go over her compression pumps with her on 7/12. 7/15; patient presents because she is having increased irritation to her ankles. She has reported more weeping than usual to her lower extremities. She would like to switch from the Coflex used at last clinic visit to a 4-layer compression wrap. She also  states she does not want to use Hydrofera Blue. Her compression pumps are  coming in on Wednesday. Electronic Signature(s) Signed: 02/21/2021 12:36:28 PM By: Kalman Shan DO Entered By: Kalman Shan on 02/21/2021 12:21:26 -------------------------------------------------------------------------------- Physical Exam Details Patient Name: Date of Service: Lisa [name], [name] 02/21/2021 10:15 A M Medical Record Number: [id number] Patient Account Number: [id number] Date of Birth/Sex: Treating RN: May 05, 1941 (80 y.o. Lisa [name] Primary Care Provider: Tilman [name] Other Clinician: Referring Provider: Treating Provider/Extender: [name] in Treatment: 4 Constitutional respirations regular, non-labored and within target range for patient.[name] Psychiatric pleasant and cooperative. Notes Right lower extremity: There is an open wound to the posterior aspect with granulation tissue present. No signs of infection. Electronic Signature(s) Signed: 02/21/2021 12:36:28 PM By: Kalman Shan DO Entered By: Kalman Shan on 02/21/2021 12:32:18 -------------------------------------------------------------------------------- Physician Orders Details Patient Name: Date of Service: Lisa [name], [name] 02/21/2021 10:15 A M Medical Record Number: [id number] Patient Account Number: [id number] Date of Birth/Sex: Treating RN: 05-21-1941 (80 y.o. Lisa [name] Primary Care Provider: Tilman [name] Other Clinician: Referring Provider: Treating Provider/Extender: [name] in Treatment: 4 Verbal / Phone Orders: No Diagnosis Coding ICD-10 Coding Code Description I89.0 Lymphedema, not elsewhere classified L97.211 Non-pressure chronic ulcer of right calf limited to breakdown of skin L97.828 Non-pressure chronic ulcer of other part of left lower leg with other specified severity Follow-up Appointments ppointment in: - Tuesday with Dr. Dellia Nims Return A Nurse Visit: - Friday 7/22 Bathing/  Shower/ Hygiene May shower with protection but do not get wound dressing(s) wet. Edema Control - Lymphedema / SCD / Other Bilateral Lower Extremities Lymphedema Pumps. Use Lymphedema pumps on leg(s) 2-3 times a day for 45-60 minutes. If wearing any wraps or hose, do not remove them. Continue exercising as instructed. - use over compression wraps Elevate legs to the level of the heart or above for 30 minutes daily and/or when sitting, a frequency of: - throughout the day Avoid standing for long periods of time. Exercise regularly Non Wound Condition Left Lower Extremity pply the following to affected area as directed: - Apply silver alginate to any weeping areas on left leg, cover with ABDs and 4 layer compression A wrap. use mixture of TCA, ketoconozole and zinc ointment to irritated area posterior ankles. ABD pad in fold posterior ankles Wound Treatment Wound #1 - Lower Leg Wound Laterality: Right, Posterior Peri-Wound Care: Zinc Oxide Ointment 30g tube 2 x Per Week/15 Days Discharge Instructions: Apply Zinc Oxide mixed with ketoconozole and TCA cream to irritated area posterior ankle Peri-Wound Care: Sween Lotion (Moisturizing lotion) 2 x Per Week/15 Days Discharge Instructions: Apply moisturizing lotion to leg Prim Dressing: KerraCel Ag Gelling Fiber Dressing, 2x2 in (silver alginate) 2 x Per Week/15 Days ary Discharge Instructions: Apply silver alginate to wound bed as instructed Secondary Dressing: Woven Gauze Sponge, Non-Sterile 4x4 in 2 x Per Week/15 Days Discharge Instructions: Apply over primary dressing as directed. Secondary Dressing: ABD Pad, 5x9 2 x Per Week/15 Days Discharge Instructions: Apply over primary dressing in skin fold posterior ankle Compression Wrap: FourPress (4 layer compression wrap) 2 x Per Week/15 Days Discharge Instructions: Apply four layer compression . do not use coflex Electronic Signature(s) Signed: 02/21/2021 12:36:28 PM By: Kalman Shan  DO Entered By: Kalman Shan on 02/21/2021 12:32:33 -------------------------------------------------------------------------------- Problem List Details Patient Name: Date of Service: Lisa [name], [name] 02/21/2021 10:15 A M Medical Record Number: [id number] Patient Account Number: [id number] Date  of Birth/Sex: Treating RN: Jan 05, 1941 (80 y.o. Lisa [name] Primary Care Provider: Tilman [name] Other Clinician: Referring Provider: Treating Provider/Extender: [name] in Treatment: 4 Active Problems ICD-10 Encounter Code Description Active Date MDM Diagnosis I89.0 Lymphedema, not elsewhere classified 01/20/2021 No Yes L97.211 Non-pressure chronic ulcer of right calf limited to breakdown of skin 01/20/2021 No Yes L97.828 Non-pressure chronic ulcer of other part of left lower leg with other specified 01/20/2021 No Yes severity Inactive Problems Resolved Problems Electronic Signature(s) Signed: 02/21/2021 12:36:28 PM By: Kalman Shan DO Entered By: Kalman Shan on 02/21/2021 12:17:24 -------------------------------------------------------------------------------- Progress Note Details Patient Name: Date of Service: Lisa [name], [name] 02/21/2021 10:15 A M Medical Record Number: [id number] Patient Account Number: [id number] Date of Birth/Sex: Treating RN: Feb 10, 1941 (80 y.o. Lisa [name] Primary Care Provider: Tilman [name] Other Clinician: Referring Provider: Treating Provider/Extender: [name] in Treatment: 4 Subjective Chief Complaint Information obtained from Patient 01/20/2021; patient is here for review of the wound on the right posterior calf however also has weeping de-epithelialized areas on the left leg as well in the setting of severe bilateral lymphedema History of Present Illness (HPI) ADMISSION 01/20/2021 This is a 80 year old woman who tells me that she has had problems  with lymphedema since the 1970s. She does not have, or has not been given, a clear explanation for this. She has been followed for many years at various lymphedema clinics including in [place] and then in [place] and now sees Lisa [name] at [place]. Developed a wound on the right posterior calf about a month ago. When that happened she is no longer able to have lymphedema therapy on this leg. She has been seeing her primary doctor who dressed this with DuoDERM and more recently with an Haematologist. The wounds were also debrided on the posterior The patient has 79 year old compression pumps but I am not sure of the frequency of their use she also has juxta lites but again I am not sure how frequently unreliably the patient is using them. She has open wounds on the right posterior and weeping de-epithelialized area predominantly on the left anterior and left anterior lateral. 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 tach and has a cardioverter defibrillator, gout and obstructive sleep apnea. She also has had a melanoma on the right leg and there is an extensive scar in the middle of lymphedema on the right anterior lower leg ABIs in our clinic were noncompressible bilaterally 6/20; patient's wound on the right posterior calf is better. She brought in her compression pumps today and says that he still do not fit her leg. She also has external compression stockings at home but she did not bring those in today. We have her in 4-layer compression she is actually doing quite well much less swelling in the right leg She has old melanoma surgery on her right leg but she is quite adamant that she did not have any radiation of this was back in the 1970s 6/29; right posterior calf wound. This appears somewhat dry but looks as though it is  attempting to epithelialize. We have better edema control. We have somebody coming out to go over her compression pumps with her on 7/12. 7/15; patient presents because she is having increased irritation to her ankles. She has reported more weeping than usual to her lower  extremities. She would like to switch from the Coflex used at last clinic visit to a 4-layer compression wrap. She also states she does not want to use Hydrofera Blue. Her compression pumps are coming in on Wednesday. Patient History Information obtained from Patient. Family History Cancer - Father, Heart Disease - Paternal Grandparents,Maternal Grandparents,Mother, Hypertension - Maternal Grandparents,Paternal Grandparents,Mother, No family history of Diabetes, Hereditary Spherocytosis, Kidney Disease, Lung Disease, Seizures, Stroke, Thyroid Problems, Tuberculosis. Social History Never smoker, Marital Status - Divorced, Alcohol Use - Rarely, Caffeine Use - Never. 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 Vitals Time Taken: 10:28 AM, Height: 62 in, Weight: 268 lbs, BMI: 49, Temperature: 97.9 F, Pulse: 62 bpm, Respiratory Rate: 18 breaths/min, Blood Pressure: 144/67 mmHg. Psychiatric pleasant and cooperative. General Notes: Right lower extremity: There is an open wound to the posterior aspect with granulation tissue present. No signs of infection. Integumentary (Hair,  Skin) Wound #1 status is Open. Original cause of wound was Gradually Appeared. The date acquired was: 09/30/2020. The wound has been in treatment 4 weeks. The wound is located on the Right,Posterior Lower Leg. The wound measures 0.9cm length x 0.9cm width x 0.1cm depth; 0.636cm^2 area and 0.064cm^3 volume. There is Fat Layer (Subcutaneous Tissue) exposed. There is no tunneling or undermining noted. There is a large amount of serous drainage noted. The wound margin is distinct with the outline attached to the wound base. There is large (67-100%) red granulation within the wound bed. There is no necrotic tissue within the wound bed. 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 She does not like the Hydrofera blue and we will switch the dressing to silver alginate. She states that she would also like to switch to a 4-layer instead of the Coflex 2 layer. We will do that today. She has increased irritation around her ankles due to drainage. We will place TCA, ketoconazole and zinc oxide to this area with an ABD pad to help with drainage control and healing. We will see her back next week. Procedures Wound #1 Pre-procedure diagnosis of Wound #1 is a Lymphedema located on the Right,Posterior Lower Leg . There was a Four Layer Compression Therapy Procedure by Levan Hurst, RN. Post procedure Diagnosis Wound #1: Same as Pre-Procedure There was a Four Layer Compression Therapy Procedure by Levan Hurst, RN. Post procedure Diagnosis Wound #: Same as Pre-Procedure Plan Follow-up Appointments: Return Appointment in: - Tuesday with Dr. Dellia Nims Nurse Visit: - Friday 7/22 Bathing/ Shower/ Hygiene: May shower with protection but do not get wound dressing(s) wet. Edema Control  - Lymphedema / SCD / Other: Lymphedema Pumps. Use Lymphedema pumps on leg(s) 2-3 times a day for 45-60 minutes. If wearing any wraps or hose, do not remove them. Continue exercising as instructed. - use over compression wraps Elevate legs to the level of the heart or above for 30 minutes daily and/or when sitting, a frequency of: - throughout the day Avoid standing for long periods  of time. Exercise regularly Non Wound Condition: Apply the following to affected area as directed: - Apply silver alginate to any weeping areas on left leg, cover with ABDs and 4 layer compression wrap. use mixture of TCA, ketoconozole and zinc ointment to irritated area posterior ankles. ABD pad in fold posterior ankles WOUND #1: - Lower Leg Wound Laterality: Right, Posterior Peri-Wound Care: Zinc Oxide Ointment 30g tube 2 x Per Week/15 Days Discharge Instructions: Apply Zinc Oxide mixed with ketoconozole and TCA cream to irritated area posterior ankle Peri-Wound Care: Sween Lotion (Moisturizing lotion) 2 x Per Week/15 Days Discharge Instructions: Apply moisturizing lotion to leg Prim Dressing: KerraCel Ag Gelling Fiber Dressing, 2x2 in (silver alginate) 2 x Per Week/15 Days ary Discharge Instructions: Apply silver alginate to wound bed as instructed Secondary Dressing: Woven Gauze Sponge, Non-Sterile 4x4 in 2 x Per Week/15 Days Discharge Instructions: Apply over primary dressing as directed. Secondary Dressing: ABD Pad, 5x9 2 x Per Week/15 Days Discharge Instructions: Apply over primary dressing in skin fold posterior ankle Com pression Wrap: FourPress (4 layer compression wrap) 2 x Per Week/15 Days Discharge Instructions: Apply four layer compression . do not use coflex 1. Silver alginate to the wound bed 2. 4-layer compression 3. TCA, ketoconazole and zinc oxide with ABD pad to areas of irritation around ankle. 4. Follow-up next week Electronic Signature(s) Signed: 02/21/2021 12:36:28 PM By: Kalman Shan  DO Entered By: Kalman Shan on 02/21/2021 12:35:37 -------------------------------------------------------------------------------- HxROS Details Patient Name: Date of Service: Lisa [name], [name] 02/21/2021 10:15 A M Medical Record Number: [id number] Patient Account Number: [id number] Date of Birth/Sex: Treating RN: [birth year] (80 y.o. Lisa [name] Primary Care Provider: Tilman [name] Other Clinician: Referring Provider: Treating Provider/Extender: [name] in Treatment: 4 Information Obtained From Patient Hematologic/Lymphatic Medical History: Positive for: Lymphedema Cardiovascular Medical History: Positive for: Arrhythmia - Ventricular Fib; Congestive Heart Failure; Coronary Artery Disease; Hypertension; Myocardial Infarction Past Medical History Notes: Has Defibrillator Endocrine Medical History: Past Medical History Notes: Hypothryoidsm Genitourinary Medical History: Past Medical History Notes: CKD state G3b Musculoskeletal Medical History: Positive for: Gout; Osteoarthritis Oncologic Medical History: Positive for: Received Chemotherapy Past Medical History Notes: Leukemia (CML) Immunizations Pneumococcal Vaccine: Received Pneumococcal Vaccination: No Implantable Devices Yes Family and Social History Cancer: Yes - Father; Diabetes: No; Heart Disease: Yes - Paternal Grandparents,Maternal Grandparents,Mother; Hereditary Spherocytosis: No; Hypertension: Yes - Maternal Grandparents,Paternal Grandparents,Mother; Kidney Disease: No; Lung Disease: No; Seizures: No; Stroke: No; Thyroid Problems: No; Tuberculosis: No; Never smoker; Marital Status - Divorced; Alcohol Use: Rarely; Caffeine Use: Never; Financial Concerns: No; Food, Clothing or Shelter Needs: No; Support System Lacking: No; Transportation Concerns: No Electronic Signature(s) Signed: 02/21/2021 12:36:28 PM By: Kalman Shan DO Signed: 02/21/2021 12:39:56 PM  By: Baruch Gouty RN, BSN Entered By: Kalman Shan on 02/21/2021 12:32:06 -------------------------------------------------------------------------------- [place] Details Patient Name: Date of Service: Lisa [name], [name] 02/21/2021 Medical Record Number: [id number] Patient Account Number: [id number] Date of Birth/Sex: Treating RN: [birth year] (80 y.o. Lisa [name] Primary Care Provider: Tilman [name] Other Clinician: Referring Provider: Treating Provider/Extender: [name] in Treatment: 4 Diagnosis Coding ICD-10 Codes Code Description I89.0 Lymphedema, not elsewhere classified L97.211 Non-pressure chronic ulcer of right calf limited to breakdown of skin L97.828 Non-pressure chronic ulcer of other part of left lower leg with other specified severity Facility Procedures CPT4: Code [id number] Description: 81 BILATERAL: Application of multi-layer venous compression system; leg (below knee), including ankle and t. Modifier: Quantity: 1 Physician  Procedures : CPT4 Code Description Modifier [id number] - WC PHYS LEVEL 3 - EST PT ICD-10 Diagnosis Description L97.211 Non-pressure chronic ulcer of right calf limited to breakdown of skin I89.0 Lymphedema, not elsewhere classified Quantity: 1 Electronic Signature(s) Signed: 02/21/2021 12:36:28 PM By: Kalman Shan DO Entered By: Kalman Shan on 02/21/2021 12:35:57

## 2021-02-21 NOTE — Progress Notes (Signed)
AZURE, BUDNICK ([id number]) Visit Report for 02/21/2021 Arrival Information Details Patient Name: Date of Service: [name], [name] 02/21/2021 10:15 A M Medical Record Number: [id number] Patient Account Number: [id number] Date of Birth/Sex: Treating RN: 05-01-1941 (80 y.o. [name] Primary Care Harmoney Sienkiewicz: [name] Other Clinician: Referring Ronnie Doo: Treating Mohid Furuya/Extender: [name] in Treatment: 4 Visit Information History Since Last Visit Added or deleted any medications: No Patient Arrived: [name] Any new allergies or adverse reactions: No Arrival Time: 10:24 Had a fall or experienced change in No Transfer Assistance: None activities of daily living that may affect Patient Requires Transmission-Based Precautions: No risk of falls: Patient Has Alerts: Yes Signs or symptoms of abuse/neglect since last visito No Patient Alerts: Patient on Blood Thinner Hospitalized since last visit: No Defibrillator Implantable device outside of the clinic excluding No ABI=Onton Bilaterally cellular tissue based products placed in the center since last visit: Has Dressing in Place as Prescribed: Yes Has Compression in Place as Prescribed: Yes Pain Present Now: No Electronic Signature(s) Signed: 02/21/2021 12:14:30 PM By: Lorrin Jackson Entered By: Lorrin Jackson on 02/21/2021 10:28:08 -------------------------------------------------------------------------------- Compression Therapy Details Patient Name: Date of Service: [name], [name] 02/21/2021 10:15 A M Medical Record Number: [id number] Patient Account Number: [id number] Date of Birth/Sex: Treating RN: [birth year] (80 y.o. [name] Primary Care Rigoberto Repass: [name] Other Clinician: Referring Hubert Raatz: Treating Dainel Arcidiacono/Extender: [name] in Treatment: 4 Compression Therapy Performed for Wound Assessment: Wound #1 Right,Posterior  Lower Leg Performed By: Clinician Levan Hurst, RN Compression Type: Four Layer Post Procedure Diagnosis Same as Pre-procedure Electronic Signature(s) Signed: 02/21/2021 12:39:56 PM By: Baruch Gouty RN, BSN Entered By: Baruch Gouty on 02/21/2021 11:03:26 -------------------------------------------------------------------------------- Compression Therapy Details Patient Name: Date of Service: [name], [name] 02/21/2021 10:15 A M Medical Record Number: [id number] Patient Account Number: [id number] Date of Birth/Sex: Treating RN: 12-05-1940 (80 y.o. [name] Primary Care Lenzi Marmo: [name] Other Clinician: Referring Frederica Chrestman: Treating Mahogony Gilchrest/Extender: [name] in Treatment: 4 Compression Therapy Performed for Wound Assessment: NonWound Condition Lymphedema - Left Leg Performed By: Clinician Levan Hurst, RN Compression Type: Four Layer Post Procedure Diagnosis Same as Pre-procedure Electronic Signature(s) Signed: 02/21/2021 12:39:56 PM By: Baruch Gouty RN, BSN Entered By: Baruch Gouty on 02/21/2021 11:04:01 -------------------------------------------------------------------------------- Lower Extremity Assessment Details Patient Name: Date of Service: [name], [name] 02/21/2021 10:15 A M Medical Record Number: [id number] Patient Account Number: [id number] Date of Birth/Sex: Treating RN: 1940/08/23 (80 y.o. [name] Primary Care Adeana Grilliot: [name] Other Clinician: Referring Aimie Wagman: Treating Malaika Arnall/Extender: [name] in Treatment: 4 Edema Assessment Assessed: [name]: Yes] [name]: Yes] Edema: [Left: Yes] [Right: Yes] Calf Left: Right: Point of Measurement: 30 cm From Medial Instep 61 cm 68 cm Ankle Left: Right: Point of Measurement: 6 cm From Medial Instep 33 cm 34 cm Vascular Assessment Pulses: Dorsalis Pedis Palpable: [Left:Yes]  [Right:Yes] Electronic Signature(s) Signed: 02/21/2021 12:14:30 PM By: Lorrin Jackson Entered By: Lorrin Jackson on 02/21/2021 10:43:54 -------------------------------------------------------------------------------- Multi Wound Chart Details Patient Name: Date of Service: [name], [name] 02/21/2021 10:15 A M Medical Record Number: [id number] Patient Account Number: [id number] Date of Birth/Sex: Treating RN: Apr 24, 1941 (80 y.o. [name] Primary Care Sevyn Markham: [name] Other Clinician: Referring Joann Jorge: Treating Eila Runyan/Extender: [name] in Treatment: 4 Vital Signs Height(in): 62 Pulse(bpm): 41 Weight(lbs): 268 Blood Pressure(mmHg): 144/67 Body Mass Index(BMI): 49 Temperature(F):  97.9 Respiratory Rate(breaths/min): 18 Photos: [N/A:N/A] Right, Posterior Lower Leg N/A N/A Wound Location: Gradually Appeared N/A N/A Wounding Event: Lymphedema N/A N/A Primary Etiology: Lymphedema, Arrhythmia, Congestive N/A N/A Comorbid History: Heart Failure, Coronary Artery Disease, Hypertension, Myocardial Infarction, Gout, Osteoarthritis, Received Chemotherapy 09/30/2020 N/A N/A Date Acquired: 4 N/A N/A Weeks of Treatment: Open N/A N/A Wound Status: 0.9x0.9x0.1 N/A N/A Measurements L x W x D (cm) 0.636 N/A N/A A (cm) : rea 0.064 N/A N/A Volume (cm) : 77.50% N/A N/A % Reduction in Area: 88.70% N/A N/A % Reduction in Volume: Full Thickness Without Exposed N/A N/A Classification: Support Structures Large N/A N/A Exudate Amount: Serous N/A N/A Exudate Type: amber N/A N/A Exudate Color: Distinct, outline attached N/A N/A Wound Margin: Large (67-100%) N/A N/A Granulation Amount: Red N/A N/A Granulation Quality: None Present (0%) N/A N/A Necrotic Amount: Fat Layer (Subcutaneous Tissue): Yes N/A N/A Exposed Structures: Fascia: No Tendon: No Muscle: No Joint: No Bone: No Medium (34-66%) N/A  N/A Epithelialization: Compression Therapy N/A N/A Procedures Performed: Treatment Notes Electronic Signature(s) Signed: 02/21/2021 12:36:28 PM By: Kalman Shan DO Signed: 02/21/2021 12:39:56 PM By: Baruch Gouty RN, BSN Entered By: Kalman Shan on 02/21/2021 12:14:41 -------------------------------------------------------------------------------- Multi-Disciplinary Care Plan Details Patient Name: Date of Service: [name], [name] 02/21/2021 10:15 A M Medical Record Number: [id number] Patient Account Number: [id number] Date of Birth/Sex: Treating RN: [birth year] (80 y.o. [name] Primary Care Itzabella Sorrels: [name] Other Clinician: Referring Gradyn Shein: Treating Keylon Labelle/Extender: [name] in Treatment: 4 Multidisciplinary Care Plan reviewed with physician Active Inactive Venous Leg Ulcer Nursing Diagnoses: Knowledge deficit related to disease process and management Potential for venous Insuffiency (use before diagnosis confirmed) Goals: Patient will maintain optimal edema control Date Initiated: 01/20/2021 Target Resolution Date: 03/21/2021 Goal Status: Active Interventions: Assess peripheral edema status every visit. Compression as ordered Treatment Activities: Therapeutic compression applied : 01/20/2021 Notes: Wound/Skin Impairment Nursing Diagnoses: Impaired tissue integrity Knowledge deficit related to ulceration/compromised skin integrity Goals: Patient/caregiver will verbalize understanding of skin care regimen Date Initiated: 01/20/2021 Target Resolution Date: 03/21/2021 Goal Status: Active Ulcer/skin breakdown will have a volume reduction of 30% by week 4 Date Initiated: 01/20/2021 Date Inactivated: 02/21/2021 Target Resolution Date: 02/17/2021 Goal Status: Met Ulcer/skin breakdown will have a volume reduction of 50% by week 8 Date Initiated: 02/21/2021 Target Resolution Date: 03/21/2021 Goal Status:  Active Interventions: Assess patient/caregiver ability to obtain necessary supplies Assess patient/caregiver ability to perform ulcer/skin care regimen upon admission and as needed Assess ulceration(s) every visit Provide education on ulcer and skin care Treatment Activities: Skin care regimen initiated : 01/20/2021 Topical wound management initiated : 01/20/2021 Notes: Electronic Signature(s) Signed: 02/21/2021 12:39:56 PM By: Baruch Gouty RN, BSN Entered By: Baruch Gouty on 02/21/2021 10:56:43 -------------------------------------------------------------------------------- Pain Assessment Details Patient Name: Date of Service: [name], [name] 02/21/2021 10:15 A M Medical Record Number: [id number] Patient Account Number: [id number] Date of Birth/Sex: Treating RN: Mar 30, 1941 (80 y.o. [name] Primary Care Jazzmen Restivo: [name] Other Clinician: Referring Casmira Cramer: Treating Vianey Caniglia/Extender: [name] in Treatment: 4 Active Problems Location of Pain Severity and Description of Pain Patient Has Paino No Site Locations Pain Management and Medication Current Pain Management: Electronic Signature(s) Signed: 02/21/2021 12:14:30 PM By: Lorrin Jackson Entered By: Lorrin Jackson on 02/21/2021 10:28:41 -------------------------------------------------------------------------------- Patient/Caregiver Education Details Patient Name: Date of Service: [name], [place] RGUER[place] Chauncey Cruel 7/15/2022andnbsp10:15 Bartholomew Record Number: [id number] Patient Account Number: [id number] Date of Birth/Gender: Treating RN:  08/08/1941 (80 y.o. [name] Primary Care Physician: [name] Other Clinician: Referring Physician: Treating Physician/Extender: [name] in Treatment: 4 Education Assessment Education Provided To: Patient Education Topics Provided Venous: Methods: Explain/Verbal Responses:  Reinforcements needed, State content correctly Wound/Skin Impairment: Methods: Explain/Verbal Responses: Reinforcements needed, State content correctly Electronic Signature(s) Signed: 02/21/2021 12:39:56 PM By: Baruch Gouty RN, BSN Entered By: Baruch Gouty on 02/21/2021 10:57:17 -------------------------------------------------------------------------------- Wound Assessment Details Patient Name: Date of Service: [name], [name] 02/21/2021 10:15 A M Medical Record Number: [id number] Patient Account Number: [id number] Date of Birth/Sex: Treating RN: 03/07/1941 (80 y.o. [name] Primary Care Kree Armato: [name] Other Clinician: Referring Beola Vasallo: Treating Tymber Stallings/Extender: [name] in Treatment: 4 Wound Status Wound Number: 1 Primary Lymphedema Etiology: Wound Location: Right, Posterior Lower Leg Wound Open Wounding Event: Gradually Appeared Status: Date Acquired: 09/30/2020 Comorbid Lymphedema, Arrhythmia, Congestive Heart Failure, Coronary Weeks Of Treatment: 4 History: Artery Disease, Hypertension, Myocardial Infarction, Gout, Clustered Wound: No Osteoarthritis, Received Chemotherapy Photos Wound Measurements Length: (cm) 0.9 Width: (cm) 0.9 Depth: (cm) 0.1 Area: (cm) 0.636 Volume: (cm) 0.064 % Reduction in Area: 77.5% % Reduction in Volume: 88.7% Epithelialization: Medium (34-66%) Tunneling: No Undermining: No Wound Description Classification: Full Thickness Without Exposed Support Structures Wound Margin: Distinct, outline attached Exudate Amount: Large Exudate Type: Serous Exudate Color: amber Foul Odor After Cleansing: No Slough/Fibrino No Wound Bed Granulation Amount: Large (67-100%) Exposed Structure Granulation Quality: Red Fascia Exposed: No Necrotic Amount: None Present (0%) Fat Layer (Subcutaneous Tissue) Exposed: Yes Tendon Exposed: No Muscle Exposed: No Joint Exposed: No Bone  Exposed: No Electronic Signature(s) Signed: 02/21/2021 12:09:51 PM By: Sandre Kitty Signed: 02/21/2021 12:14:30 PM By: Lorrin Jackson Entered By: Sandre Kitty on 02/21/2021 12:00:44 -------------------------------------------------------------------------------- Vitals Details Patient Name: Date of Service: [name], [name] 02/21/2021 10:15 A M Medical Record Number: [id number] Patient Account Number: [id number] Date of Birth/Sex: Treating RN: Mar 12, 1941 (80 y.o. [name] Primary Care Yasseen Salls: [name] Other Clinician: Referring Kyzer Blowe: Treating Rielynn Trulson/Extender: [name] in Treatment: 4 Vital Signs Time Taken: 10:28 Temperature (F): 97.9 Height (in): 62 Pulse (bpm): 62 Weight (lbs): 268 Respiratory Rate (breaths/min): 18 Body Mass Index (BMI): 49 Blood Pressure (mmHg): 144/67 Reference Range: 80 - 120 mg / dl Electronic Signature(s) Signed: 02/21/2021 12:14:30 PM By: Lorrin Jackson Entered By: Lorrin Jackson on 02/21/2021 10:28:33

## 2021-02-24 ENCOUNTER — Encounter: Payer: Medicare Other | Admitting: Occupational Therapy

## 2021-02-24 ENCOUNTER — Other Ambulatory Visit: Payer: Self-pay | Admitting: Internal Medicine

## 2021-02-24 DIAGNOSIS — I5022 Chronic systolic (congestive) heart failure: Secondary | ICD-10-CM

## 2021-02-25 ENCOUNTER — Other Ambulatory Visit: Payer: Self-pay

## 2021-02-25 ENCOUNTER — Encounter (HOSPITAL_BASED_OUTPATIENT_CLINIC_OR_DEPARTMENT_OTHER): Payer: Medicare Other | Admitting: Internal Medicine

## 2021-02-25 DIAGNOSIS — L97211 Non-pressure chronic ulcer of right calf limited to breakdown of skin: Secondary | ICD-10-CM | POA: Diagnosis not present

## 2021-02-25 NOTE — Progress Notes (Signed)
Lisa [name], Lisa [name] ([id number]) Visit Report for 02/25/2021 HPI Details Patient Name: Date of Service: Lisa [name], [name] 02/25/2021 12:30 PM Medical Record Number: [id number] Patient Account Number: [id number] Date of Birth/Sex: Treating RN: July 02, 1941 (80 y.o. [name], Lauren Primary Care Provider: [name] Other Clinician: Referring Provider: Treating Provider/Extender: Heidi Dach in Treatment: 5 History of Present Illness HPI Description: ADMISSION 01/20/2021 This [name] a 80 year old woman who tells me that she has had problems with lymphedema since the 1970s. She does not have, or has not been given, a clear explanation for this. She has been followed for many years at various lymphedema clinics including in [place] and then in [place] and now sees Stephani Police at [place]. Developed a wound on the right posterior calf about a month ago. When that happened she [name] no longer able to have lymphedema therapy on this leg. She has been seeing her primary doctor who dressed this with DuoDERM and more recently with an Haematologist. The wounds were also debrided on the posterior The patient has 79 year old compression pumps but I am not sure of the frequency of their use she also has juxta lites but again I am not sure how frequently unreliably the patient [name] using them. She has open wounds on the right posterior and weeping de-epithelialized area predominantly on the left anterior and left anterior lateral. 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 tach and has a cardioverter defibrillator, gout and obstructive sleep apnea. She also has had a melanoma on the right leg and there [name] an extensive scar in the middle of lymphedema on the right anterior lower leg ABIs in our clinic were  noncompressible bilaterally 6/20; patient's wound on the right posterior calf [name] better. She brought in her compression pumps today and says that he still do not fit her leg. She also has external compression stockings at home but she did not bring those in today. We have her in 4-layer compression she [name] actually doing quite well much less swelling in the right leg She has old melanoma surgery on her right leg but she [name] quite adamant that she did not have any radiation of this was back in the 1970s 6/29; right posterior calf wound. This appears somewhat dry but looks as though it [name] attempting to epithelialize. We have better edema control. We have somebody coming out to go over her compression pumps with her on 7/12. 7/15; patient presents because she [name] having increased irritation to her ankles. She has reported more weeping than usual to her lower extremities. She would like to switch from the Coflex used at last clinic visit to a 4-layer compression wrap. She also states she does not want to use Hydrofera Blue. Her compression pumps are coming in on Wednesday. 7/19; I have not seen this patient in quite a while. At that point we are dealing with an area on the right posterior calf in the setting of severe bilateral lymphedema. I do not remember they are actually being a problem on the left leg. However Dr. Heber Anthon reports significant erythema and weeping on the left lower leg last week. We initially were using Coflex and change this to 4-layer compression last week although this [name] not really helped. She comes in today with a lot of discomfort around the left ankle and marked weeping edema. Complicating  things she [name] traveling to [facility] leaving on Sunday. She says that some members of her family or physicians and presumably they might be able to help with an Ace wrap beyond that I do not know what were going to do here. She has made arrangements for a visitation for her compression  pumps this Thursday. Electronic Signature(s) Signed: 02/25/2021 5:24:30 PM By: [name] MD Entered By: [name] on 02/25/2021 13:52:43 -------------------------------------------------------------------------------- Physical Exam Details Patient Name: Date of Service: Lisa [name], [name] 02/25/2021 12:30 PM Medical Record Number: [id number] Patient Account Number: [id number] Date of Birth/Sex: Treating RN: May 01, 1941 (80 y.o. Lisa [name] Primary Care Provider: [name] Other Clinician: Referring Provider: Treating Provider/Extender: Heidi Dach in Treatment: 5 Constitutional Patient [name] hypertensive.. Pulse regular and within target range for patient.[name] Respirations regular, non-labored and within target range.. Temperature [name] normal and within the target range for the patient.[name] Appears in no distress. Notes Wound exam; right lower extremity wound [name] much smaller than I remember clean surface no debridement [name] necessary The real problem here [name] on the left. She [name] lost surface epithelium on the left lateral extending into the left posterior calf. Also problematic there [name] intense erythema on the medial part of her left foot. I have marked this area. Electronic Signature(s) Signed: 02/25/2021 5:24:30 PM By: [name] MD Entered By: [name] on 02/25/2021 13:53:39 -------------------------------------------------------------------------------- Physician Orders Details Patient Name: Date of Service: Lisa [name], [name] 02/25/2021 12:30 PM Medical Record Number: [id number] Patient Account Number: [id number] Date of Birth/Sex: Treating RN: May 07, 1941 (80 y.o. [name], Lauren Primary Care Provider: [name] Other Clinician: Referring Provider: Treating Provider/Extender: Heidi Dach in Treatment: 5 Verbal / Phone Orders: No Diagnosis Coding Follow-up Appointments Nurse Visit:  - Friday 07/22 Other: - Pick up your antibiotic from the pharmacy Bathing/ Shower/ Hygiene May shower with protection but do not get wound dressing(s) wet. Edema Control - Lymphedema / SCD / Other Bilateral Lower Extremities Lymphedema Pumps. Use Lymphedema pumps on leg(s) 2-3 times a day for 45-60 minutes. If wearing any wraps or hose, do not remove them. Continue exercising as instructed. - use over compression wraps Elevate legs to the level of the heart or above for 30 minutes daily and/or when sitting, a frequency of: - throughout the day Avoid standing for long periods of time. Exercise regularly Non Wound Condition Left Lower Extremity pply the following to affected area as directed: - Apply silver alginate to any weeping areas on left leg, cover with ABDs and 4 layer compression A wrap. use mixture of TCA, ketoconozole and zinc ointment to irritated area posterior ankles. ABD pad in fold posterior ankles Wound Treatment Wound #1 - Lower Leg Wound Laterality: Right, Posterior Peri-Wound Care: Zinc Oxide Ointment 30g tube 2 x Per Week/15 Days Discharge Instructions: Apply Zinc Oxide mixed with ketoconozole and TCA cream to irritated area posterior ankle Peri-Wound Care: Sween Lotion (Moisturizing lotion) 2 x Per Week/15 Days Discharge Instructions: Apply moisturizing lotion to leg Prim Dressing: KerraCel Ag Gelling Fiber Dressing, 2x2 in (silver alginate) 2 x Per Week/15 Days ary Discharge Instructions: Apply silver alginate to wound bed as instructed Secondary Dressing: Woven Gauze Sponge, Non-Sterile 4x4 in 2 x Per Week/15 Days Discharge Instructions: Apply over primary dressing as directed. Secondary Dressing: ABD Pad, 5x9 2 x Per Week/15 Days Discharge Instructions: Apply over primary dressing in skin fold posterior ankle Compression Wrap: Unnaboot w/Calamine, 4x10 (in/yd) 2  x Per Week/15 Days Discharge Instructions: Apply Unnaboot as directed. May also use Miliken CoFlex  Calamine 2 layer compression system as alternative. Wound #2 - Foot Wound Laterality: Left, Medial Peri-Wound Care: Zinc Oxide Ointment 30g tube 2 x Per Week/15 Days Discharge Instructions: Apply Zinc Oxide mixed with ketoconozole and TCA cream to irritated area posterior ankle Peri-Wound Care: Sween Lotion (Moisturizing lotion) 2 x Per Week/15 Days Discharge Instructions: Apply moisturizing lotion to leg Prim Dressing: KerraCel Ag Gelling Fiber Dressing, 2x2 in (silver alginate) 2 x Per Week/15 Days ary Discharge Instructions: Apply silver alginate to wound bed as instructed Secondary Dressing: Woven Gauze Sponge, Non-Sterile 4x4 in 2 x Per Week/15 Days Discharge Instructions: Apply over primary dressing as directed. Secondary Dressing: ABD Pad, 5x9 2 x Per Week/15 Days Discharge Instructions: Apply over primary dressing in skin fold posterior ankle Compression Wrap: Unnaboot w/Calamine, 4x10 (in/yd) 2 x Per Week/15 Days Discharge Instructions: Apply Unnaboot as directed. May also use Miliken CoFlex Calamine 2 layer compression system as alternative. Patient Medications llergies: adhesive tape, meperidine, doxycycline, metronidazole, Opioids - Morphine Analogues, penicillin, sulfamethoxazole, wheat, lanolin A Notifications Medication Indication Start End cellulitis left foot 02/25/2021 cephalexin DOSE oral 500 mg capsule - 1 capsule oral q6h for 1 week Electronic Signature(s) Signed: 02/25/2021 1:55:54 PM By: [name] MD Entered By: [name] on 02/25/2021 13:55:53 -------------------------------------------------------------------------------- Problem List Details Patient Name: Date of Service: Lisa [name], [name] 02/25/2021 12:30 PM Medical Record Number: [id number] Patient Account Number: [id number] Date of Birth/Sex: Treating RN: 1941/03/25 (80 y.o. [name], Lauren Primary Care Provider: [name] Other Clinician: Referring Provider: Treating  Provider/Extender: Heidi Dach in Treatment: 5 Active Problems ICD-10 Encounter Code Description Active Date MDM Diagnosis I89.0 Lymphedema, not elsewhere classified 01/20/2021 No Yes L97.211 Non-pressure chronic ulcer of right calf limited to breakdown of skin 01/20/2021 No Yes L97.828 Non-pressure chronic ulcer of other part of left lower leg with other specified 01/20/2021 No Yes severity L03.116 Cellulitis of left lower limb 02/25/2021 No Yes Inactive Problems Resolved Problems Electronic Signature(s) Signed: 02/25/2021 5:24:30 PM By: [name] MD Entered By: [name] on 02/25/2021 13:49:31 -------------------------------------------------------------------------------- Progress Note Details Patient Name: Date of Service: Lisa [name], [name] 02/25/2021 12:30 PM Medical Record Number: [id number] Patient Account Number: [id number] Date of Birth/Sex: Treating RN: [birth year] (80 y.o. [name], Lauren Primary Care Provider: [name] Other Clinician: Referring Provider: Treating Provider/Extender: Heidi Dach in Treatment: 5 Subjective History of Present Illness (HPI) ADMISSION 01/20/2021 This [name] a 80 year old woman who tells me that she has had problems with lymphedema since the 1970s. She does not have, or has not been given, a clear explanation for this. She has been followed for many years at various lymphedema clinics including in [place] and then in [place] and now sees Stephani Police at [place]. Developed a wound on the right posterior calf about a month ago. When that happened she [name] no longer able to have lymphedema therapy on this leg. She has been seeing her primary doctor who dressed this with DuoDERM and more recently with an Haematologist. The wounds were also debrided on the posterior The patient has 79 year old compression pumps but I am not sure of the frequency of their use she also  has juxta lites but again I am not sure how frequently unreliably the patient [name] using them. She has open wounds on the right posterior and weeping de-epithelialized area predominantly on the left anterior and  left anterior lateral. 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 tach and has a cardioverter defibrillator, gout and obstructive sleep apnea. She also has had a melanoma on the right leg and there [name] an extensive scar in the middle of lymphedema on the right anterior lower leg ABIs in our clinic were noncompressible bilaterally 6/20; patient's wound on the right posterior calf [name] better. She brought in her compression pumps today and says that he still do not fit her leg. She also has external compression stockings at home but she did not bring those in today. We have her in 4-layer compression she [name] actually doing quite well much less swelling in the right leg She has old melanoma surgery on her right leg but she [name] quite adamant that she did not have any radiation of this was back in the 1970s 6/29; right posterior calf wound. This appears somewhat dry but looks as though it [name] attempting to epithelialize. We have better edema control. We have somebody coming out to go over her compression pumps with her on 7/12. 7/15; patient presents because she [name] having increased irritation to her ankles. She has reported more weeping than usual to her lower extremities. She would like to switch from the Coflex used at last clinic visit to a 4-layer compression wrap. She also states she does not want to use Hydrofera Blue. Her compression pumps are coming in on Wednesday. 7/19; I have not seen this patient in quite a while. At that point we are dealing with an area on the right posterior calf in the setting of severe bilateral lymphedema. I  do not remember they are actually being a problem on the left leg. However Dr. Heber Sheridan reports significant erythema and weeping on the left lower leg last week. We initially were using Coflex and change this to 4-layer compression last week although this [name] not really helped. She comes in today with a lot of discomfort around the left ankle and marked weeping edema. Complicating things she [name] traveling to [facility] leaving on Sunday. She says that some members of her family or physicians and presumably they might be able to help with an Ace wrap beyond that I do not know what were going to do here. She has made arrangements for a visitation for her compression pumps this Thursday. Objective Constitutional Patient [name] hypertensive.. Pulse regular and within target range for patient.[name] Respirations regular, non-labored and within target range.. Temperature [name] normal and within the target range for the patient.[name] Appears in no distress. Vitals Time Taken: 1:00 PM, Height: 62 in, Weight: 268 lbs, BMI: 49, Temperature: 98.4 F, Pulse: 66 bpm, Respiratory Rate: 20 breaths/min, Blood Pressure: 179/88 mmHg. General Notes: Wound exam; right lower extremity wound [name] much smaller than I remember clean surface no debridement [name] necessary oo The real problem here [name] on the left. She [name] lost surface epithelium on the left lateral extending into the left posterior calf. Also problematic there [name] intense erythema on the medial part of her left foot. I have marked this area. Integumentary (Hair, Skin) Wound #1 status [name] Open. Original cause of wound was Gradually Appeared. The date acquired was: 09/30/2020. The wound has been in treatment 5 weeks. The wound [name] located on the Right,Posterior Lower Leg. The wound measures 0.7cm length x 0.8cm width x 0.1cm depth; 0.44cm^2 area and 0.044cm^3 volume.  There [name] Fat Layer (Subcutaneous Tissue) exposed. There [name] no tunneling or undermining noted. There [name] a large amount  of serous drainage noted. The wound margin [name] distinct with the outline attached to the wound base. There [name] large (67-100%) pink, pale granulation within the wound bed. There [name] no necrotic tissue within the wound bed. Wound #2 status [name] Open. Original cause of wound was Gradually Appeared. The date acquired was: 02/25/2021. The wound [name] located on the Left,Medial Foot. The wound measures 4cm length x 2.5cm width x 0.1cm depth; 7.854cm^2 area and 0.785cm^3 volume. There [name] Fat Layer (Subcutaneous Tissue) exposed. There [name] no tunneling or undermining noted. There [name] a large amount of serous drainage noted. The wound margin [name] distinct with the outline attached to the wound base. There [name] large (67-100%) red granulation within the wound bed. There [name] no necrotic tissue within the wound bed. 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 There was a Four Layer Compression Therapy Procedure by Rhae Hammock, RN. Post procedure Diagnosis Wound #1: Same as Pre-Procedure Wound #2 Pre-procedure diagnosis of Wound #2 [name] a Lymphedema located on the Left,Medial Foot . There was a Four Layer Compression Therapy Procedure by Rhae Hammock, RN. Post procedure Diagnosis Wound #2: Same as Pre-Procedure Plan Follow-up Appointments: Nurse Visit: - Friday 07/22 Other: - Pick up your antibiotic from the pharmacy Bathing/ Shower/ Hygiene: May shower with protection but do not get wound dressing(s) wet. Edema Control - Lymphedema / SCD / Other: Lymphedema Pumps. Use Lymphedema pumps on leg(s) 2-3 times a day for 45-60 minutes. If wearing any wraps or hose, do not remove them.  Continue exercising as instructed. - use over compression wraps Elevate legs to the level of the heart or above for 30 minutes daily and/or when sitting, a frequency of: - throughout the day Avoid standing for long periods of time. Exercise regularly Non Wound Condition: Apply the following to affected area as directed: - Apply silver alginate to any weeping areas on left leg, cover with ABDs and 4 layer compression wrap. use mixture of TCA, ketoconozole and zinc ointment to irritated area posterior ankles. ABD pad in fold posterior ankles The following medication(s) was prescribed: cephalexin oral 500 mg capsule 1 capsule oral q6h for 1 week for cellulitis left foot starting 02/25/2021 WOUND #1: - Lower Leg Wound Laterality: Right, Posterior Peri-Wound Care: Zinc Oxide Ointment 30g tube 2 x Per Week/15 Days Discharge Instructions: Apply Zinc Oxide mixed with ketoconozole and TCA cream to irritated area posterior ankle Peri-Wound Care: Sween Lotion (Moisturizing lotion) 2 x Per Week/15 Days Discharge Instructions: Apply moisturizing lotion to leg Prim Dressing: KerraCel Ag Gelling Fiber Dressing, 2x2 in (silver alginate) 2 x Per Week/15 Days ary Discharge Instructions: Apply silver alginate to wound bed as instructed Secondary Dressing: Woven Gauze Sponge, Non-Sterile 4x4 in 2 x Per Week/15 Days Discharge Instructions: Apply over primary dressing as directed. Secondary Dressing: ABD Pad, 5x9 2 x Per Week/15 Days Discharge Instructions: Apply over primary dressing in skin fold posterior ankle Com pression Wrap: Unnaboot w/Calamine, 4x10 (in/yd) 2 x Per Week/15 Days Discharge Instructions: Apply Unnaboot as directed. May also use Miliken CoFlex Calamine 2 layer compression system as alternative. WOUND #2: -  Foot Wound Laterality: Left, Medial Peri-Wound Care: Zinc Oxide Ointment 30g tube 2 x Per Week/15 Days Discharge Instructions: Apply Zinc Oxide mixed with ketoconozole and TCA cream to  irritated area posterior ankle Peri-Wound Care: Sween Lotion (Moisturizing lotion) 2 x Per Week/15 Days Discharge Instructions: Apply moisturizing lotion to leg Prim Dressing: KerraCel Ag Gelling Fiber Dressing, 2x2 in (silver alginate) 2 x Per Week/15 Days ary Discharge Instructions: Apply silver alginate to wound bed as instructed Secondary Dressing: Woven Gauze Sponge, Non-Sterile 4x4 in 2 x Per Week/15 Days Discharge Instructions: Apply over primary dressing as directed. Secondary Dressing: ABD Pad, 5x9 2 x Per Week/15 Days Discharge Instructions: Apply over primary dressing in skin fold posterior ankle Compression Wrap: Unnaboot w/Calamine, 4x10 (in/yd) 2 x Per Week/15 Days Discharge Instructions: Apply Unnaboot as directed. May also use Miliken CoFlex Calamine 2 layer compression system as alternative. 1. Cephalexin 500 every 6 for 7 days for presumed cellulitis/erysipelas on the left medial foot 2. Marked problem with edema in the left leg posteriorly weeping fluid and denuded surface epithelium. She [name] complaining bitterly about the 4-layer compression 3. No option but to continue silver alginate to put ABDs and I put her in an Unna boot to see if this would be more comfortable for her. 4. Continue with silver alginate to the right posterior calf which was her original wound. I really do not remember seeing anything abnormal in the left leg Electronic Signature(s) Signed: 02/25/2021 5:24:30 PM By: [name] MD Entered By: [name] on 02/25/2021 13:57:19 -------------------------------------------------------------------------------- SuperBill Details Patient Name: Date of Service: Lisa [name], [name] 02/25/2021 Medical Record Number: [id number] Patient Account Number: [id number] Date of Birth/Sex: Treating RN: 11/19/1940 (81 y.o. [name], Lauren Primary Care Provider: [name] Other Clinician: Referring Provider: Treating Provider/Extender: Heidi Dach in Treatment: 5 Diagnosis Coding ICD-10 Codes Code Description I89.0 Lymphedema, not elsewhere classified M78.675 Non-pressure chronic ulcer of right calf limited to breakdown of skin L97.828 Non-pressure chronic ulcer of other part of left lower leg with other specified severity L03.116 Cellulitis of left lower limb Facility Procedures CPT4 Code: 44920100 Description: 29580 - APPLY UNNA BOOT/PROFO BILATERAL Modifier: Quantity: 1 Physician Procedures : CPT4 Code Description Modifier [id number] - WC PHYS LEVEL 3 - EST PT ICD-10 Diagnosis Description I89.0 Lymphedema, not elsewhere classified L03.116 Cellulitis of left lower limb L97.211 Non-pressure chronic ulcer of right calf limited to breakdown  of skin L97.828 Non-pressure chronic ulcer of other part of left lower leg with other specified severity Quantity: 1 Electronic Signature(s) Signed: 02/25/2021 5:24:30 PM By: [name] MD Entered By: [name] on 02/25/2021 13:57:55

## 2021-02-25 NOTE — Progress Notes (Signed)
[name], [name] ([id number]) Visit Report for 02/25/2021 Arrival Information Details Patient Name: Date of Service: [name], [name] 02/25/2021 12:30 PM Medical Record Number: [id number] Patient Account Number: [id number] Date of Birth/Sex: Treating RN: 08/17/1940 (80 y.o. Lisa [name] Primary Care Lisa [name]: [name] Other Clinician: Referring Giovanne Nickolson: Treating Velvet Moomaw/Extender: Lisa [name] in Treatment: 5 Visit Information History Since Last Visit Added or deleted any medications: No Patient Arrived: Lisa [name] Any new allergies or adverse reactions: No Arrival Time: 12:49 Had a fall or experienced change in No Transfer Assistance: None activities of daily living that may affect Patient Identification Verified: Yes risk of falls: Secondary Verification Process Completed: Yes Signs or symptoms of abuse/neglect since last visito No Patient Requires Transmission-Based Precautions: No Hospitalized since last visit: No Patient Has Alerts: Yes Implantable device outside of the clinic excluding No Patient Alerts: Patient on Blood Thinner cellular tissue based products placed in the center Defibrillator since last visit: ABI=[place] Bilaterally Has Dressing in Place as Prescribed: Yes Has Compression in Place as Prescribed: Yes Pain Present Now: Yes Electronic Signature([name]) Signed: 02/25/2021 5:33:00 PM By: Lisa [name] Entered By: Lisa [name] on 02/25/2021 12:49:46 -------------------------------------------------------------------------------- Compression Therapy Details Patient Name: Date of Service: [name], [place] Lisa [name]. 02/25/2021 12:30 PM Medical Record Number: [id number] Patient Account Number: [id number] Date of Birth/Sex: Treating RN: October 02, 1940 (80 y.o. Lisa [name], Lisa [name] Primary Care Marche Hottenstein: [name] Other Clinician: Referring Mima Cranmore: Treating Mirza Fessel/Extender: Lisa [name] in  Treatment: 5 Compression Therapy Performed for Wound Assessment: Wound #1 Right,Posterior Lower Leg Performed By: Clinician Lisa Hammock, RN Compression Type: Four Layer Post Procedure Diagnosis Same as Pre-procedure Electronic Signature([name]) Signed: 02/25/2021 5:32:12 PM By: Lisa Hammock RN Entered By: Lisa [name] on 02/25/2021 13:32:38 -------------------------------------------------------------------------------- Compression Therapy Details Patient Name: Date of Service: [name], [place] Lisa [name]. 02/25/2021 12:30 PM Medical Record Number: [id number] Patient Account Number: [id number] Date of Birth/Sex: Treating RN: Jul 24, 1941 (80 y.o. Lisa [name], Lisa [name] Primary Care Cullan Launer: [name] Other Clinician: Referring Jandel Patriarca: Treating Taylin Mans/Extender: Lisa [name] in Treatment: 5 Compression Therapy Performed for Wound Assessment: Wound #2 Left,Medial Foot Performed By: Clinician Lisa Hammock, RN Compression Type: Four Layer Post Procedure Diagnosis Same as Pre-procedure Electronic Signature([name]) Signed: 02/25/2021 5:32:12 PM By: Lisa Hammock RN Entered By: Lisa [name] on 02/25/2021 13:32:38 -------------------------------------------------------------------------------- Encounter Discharge Information Details Patient Name: Date of Service: [name], [place] Lisa [name]. 02/25/2021 12:30 PM Medical Record Number: [id number] Patient Account Number: [id number] Date of Birth/Sex: Treating RN: 02-Jan-1941 (80 y.o. Lisa [name] Primary Care Keldan Eplin: [name] Other Clinician: Referring Fallon Haecker: Treating Brittne Kawasaki/Extender: Lisa [name] in Treatment: 5 Encounter Discharge Information Items Discharge Condition: Stable Ambulatory Status: [place] Discharge Destination: Home Transportation: Private Auto Schedule Follow-up Appointment: Yes Clinical Summary of Care: Provided on 02/25/2021 Form  Type Recipient Paper Patient Patient Electronic Signature([name]) Signed: 02/25/2021 2:19:37 PM By: Lisa [name] Entered By: Lisa [name] on 02/25/2021 14:19:37 -------------------------------------------------------------------------------- Lower Extremity Assessment Details Patient Name: Date of Service: [name], [place] Lisa [name]. 02/25/2021 12:30 PM Medical Record Number: [id number] Patient Account Number: [id number] Date of Birth/Sex: Treating RN: June 28, 1941 (80 y.o. Lisa [name] Primary Care Andreu Drudge: [name] Other Clinician: Referring Leandrew Keech: Treating Jax Kentner/Extender: Lisa [name] in Treatment: 5 Edema Assessment Assessed: Lisa [name]: Yes] Lisa [name]: Yes] Edema: [Left: Yes] [Right: Yes] Calf Left: Right: Point of Measurement: 30 cm From Medial Instep 60 cm 66 cm Ankle Left: Right: Point of Measurement:  6 cm From Medial Instep 33 cm 34 cm Vascular Assessment Pulses: Dorsalis Pedis Palpable: [Left:Yes] [Right:Yes] Electronic Signature([name]) Signed: 02/25/2021 5:33:00 PM By: Lisa [name] Entered By: Lisa [name] on 02/25/2021 13:02:49 -------------------------------------------------------------------------------- Multi Wound Chart Details Patient Name: Date of Service: [name], [place] Lisa [name]. 02/25/2021 12:30 PM Medical Record Number: [id number] Patient Account Number: [id number] Date of Birth/Sex: Treating RN: Dec 07, 1940 (80 y.o. Lisa [name], Lisa [name] Primary Care Ceferino Lang: [name] Other Clinician: Referring Winton Offord: Treating Sharmeka Palmisano/Extender: Lisa [name] in Treatment: 5 Vital Signs Height(in): 67 Pulse(bpm): 67 Weight(lbs): 176 Blood Pressure(mmHg): 179/88 Body Mass Index(BMI): 49 Temperature(F): 98.4 Respiratory Rate(breaths/min): 20 Photos: [1:Right, Posterior Lower Leg] [2:No Photos No Photos Left, Lateral Foot] [N/A:N/A N/A] Wound Location: [1:Gradually Appeared] [2:Gradually Appeared]  [N/A:N/A] Wounding Event: [1:Lymphedema] [2:Lymphedema] [N/A:N/A] Primary Etiology: [1:Lymphedema, Arrhythmia, Congestive Lymphedema, Arrhythmia, Congestive N/A] Comorbid History: [1:Heart Failure, Coronary Artery Disease, Hypertension, Myocardial Disease, Hypertension, Myocardial Infarction, Gout, Osteoarthritis, Infarction, Gout, Osteoarthritis, Received Chemotherapy 09/30/2020] [2:Heart Failure, Coronary Artery  Received Chemotherapy 02/25/2021] [N/A:N/A] Date Acquired: [1:5] [2:0] [N/A:N/A] Weeks of Treatment: [1:Open] [2:Open] [N/A:N/A] Wound Status: [1:0.7x0.8x0.1] [2:4x2.5x0.1] [N/A:N/A] Measurements L x W x D (cm) [1:0.44] [1:6.073] [N/A:N/A] A (cm) : rea [1:0.044] [2:0.785] [N/A:N/A] Volume (cm) : [1:84.40%] [2:N/A] [N/A:N/A] % Reduction in Area: [1:92.20%] [2:N/A] [N/A:N/A] % Reduction in Volume: [1:Full Thickness Without Exposed] [2:Full Thickness Without Exposed] [N/A:N/A] Classification: [1:Support Structures Large] [2:Support Structures Large] [N/A:N/A] Exudate Amount: [1:Serous] [2:Serous] [N/A:N/A] Exudate Type: [1:amber] [2:amber] [N/A:N/A] Exudate Color: [1:Distinct, outline attached] [2:Distinct, outline attached] [N/A:N/A] Wound Margin: [1:Large (67-100%)] [2:Large (67-100%)] [N/A:N/A] Granulation Amount: [1:Pink, Pale] [2:Red] [N/A:N/A] Granulation Quality: [1:None Present (0%)] [2:None Present (0%)] [N/A:N/A] Necrotic Amount: [1:Fat Layer (Subcutaneous Tissue): Yes Fat Layer (Subcutaneous Tissue): Yes N/A] Exposed Structures: [1:Fascia: No Tendon: No Muscle: No Joint: No Bone: No Medium (34-66%)] [2:Fascia: No Tendon: No Muscle: No Joint: No Bone: No None] [N/A:N/A] Epithelialization: [1:Compression Therapy] [2:Compression Therapy] [N/A:N/A] Treatment Notes Electronic Signature([name]) Signed: 02/25/2021 5:24:30 PM By: Lisa Ham MD Signed: 02/25/2021 5:32:12 PM By: Lisa Hammock RN Entered By: Lisa [name] on 02/25/2021  13:50:09 -------------------------------------------------------------------------------- [place] Details Patient Name: Date of Service: [name], [place] Lisa [name]. 02/25/2021 12:30 PM Medical Record Number: [id number] Patient Account Number: [id number] Date of Birth/Sex: Treating RN: [birth year] (80 y.o. Lisa [name], Lisa [name] Primary Care Ercilia Bettinger: [name] Other Clinician: Referring Spence Soberano: Treating Zekiel Torian/Extender: Lisa [name] in Treatment: 5 Multidisciplinary Care Plan reviewed with physician Active Inactive Venous Leg Ulcer Nursing Diagnoses: Knowledge deficit related to disease process and management Potential for venous Insuffiency (use before diagnosis confirmed) Goals: Patient will maintain optimal edema control Date Initiated: 01/20/2021 Target Resolution Date: 03/21/2021 Goal Status: Active Interventions: Assess peripheral edema status every visit. Compression as ordered Treatment Activities: Therapeutic compression applied : 01/20/2021 Notes: Wound/Skin Impairment Nursing Diagnoses: Impaired tissue integrity Knowledge deficit related to ulceration/compromised skin integrity Goals: Patient/caregiver will verbalize understanding of skin care regimen Date Initiated: 01/20/2021 Target Resolution Date: 03/21/2021 Goal Status: Active Ulcer/skin breakdown will have a volume reduction of 30% by week 4 Date Initiated: 01/20/2021 Date Inactivated: 02/21/2021 Target Resolution Date: 02/17/2021 Goal Status: Met Ulcer/skin breakdown will have a volume reduction of 50% by week 8 Date Initiated: 02/21/2021 Target Resolution Date: 03/21/2021 Goal Status: Active Interventions: Assess patient/caregiver ability to obtain necessary supplies Assess patient/caregiver ability to perform ulcer/skin care regimen upon admission and as needed Assess ulceration([name]) every visit Provide education on ulcer and skin care Treatment  Activities: Skin care regimen initiated : 01/20/2021 Topical  wound management initiated : 01/20/2021 Notes: Electronic Signature([name]) Signed: 02/25/2021 5:32:12 PM By: Lisa Hammock RN Entered By: Lisa [name] on 02/25/2021 13:28:04 -------------------------------------------------------------------------------- Pain Assessment Details Patient Name: Date of Service: [name], [place] Lisa [name]. 02/25/2021 12:30 PM Medical Record Number: [id number] Patient Account Number: [id number] Date of Birth/Sex: Treating RN: [birth year] (80 y.o. Lisa [name] Primary Care Wilsie Kern: [name] Other Clinician: Referring Hriday Stai: Treating Verdie Wilms/Extender: Lisa [name] in Treatment: 5 Active Problems Location of Pain Severity and Description of Pain Patient Has Paino Yes Site Locations Pain Location: Pain in Ulcers With Dressing Change: Yes Rate the pain. Current Pain Level: 8 Character of Pain Describe the Pain: Aching, Burning Pain Management and Medication Current Pain Management: Medication: Yes Cold Application: No Rest: Yes Massage: No Activity: No T.E.N.[name].: No Heat Application: No Leg drop or elevation: No Is the Current Pain Management Adequate: Inadequate How does your wound impact your activities of daily livingo Sleep: Yes Bathing: No Appetite: No Relationship With Others: No Bladder Continence: No Emotions: No Bowel Continence: No Work: No Toileting: No Drive: No Dressing: No Hobbies: No Electronic Signature([name]) Signed: 02/25/2021 5:33:00 PM By: Lisa [name] Entered By: Lisa [name] on 02/25/2021 12:50:23 -------------------------------------------------------------------------------- Patient/Caregiver Education Details Patient Name: Date of Service: [name], [place] [name] 7/19/2022andnbsp12:30 PM Medical Record Number: [id number] Patient Account Number: [id number] Date of Birth/Gender: Treating RN: 1940-08-25 (80 y.o.  Lisa [name], Lisa [name] Primary Care Physician: [name] Other Clinician: Referring Physician: Treating Physician/Extender: Lisa [name] in Treatment: 5 Education Assessment Education Provided To: Patient Education Topics Provided Wound/Skin Impairment: Methods: Explain/Verbal Responses: State content correctly Electronic Signature([name]) Signed: 02/25/2021 5:32:12 PM By: Lisa Hammock RN Entered By: Lisa [name] on 02/25/2021 13:28:23 -------------------------------------------------------------------------------- Wound Assessment Details Patient Name: Date of Service: [name], [place] Lisa [name]. 02/25/2021 12:30 PM Medical Record Number: [id number] Patient Account Number: [id number] Date of Birth/Sex: Treating RN: 12/05/1940 (80 y.o. Lisa [name] Primary Care Lamere Lightner: [name] Other Clinician: Referring Tyriana Helmkamp: Treating Alonso Gapinski/Extender: Lisa [name] in Treatment: 5 Wound Status Wound Number: 1 Primary Lymphedema Etiology: Wound Location: Right, Posterior Lower Leg Wound Open Wounding Event: Gradually Appeared Status: Date Acquired: 09/30/2020 Comorbid Lymphedema, Arrhythmia, Congestive Heart Failure, Coronary Weeks Of Treatment: 5 History: Artery Disease, Hypertension, Myocardial Infarction, Gout, Clustered Wound: No Osteoarthritis, Received Chemotherapy Photos Photo Uploaded By: Lisa [name] on 02/25/2021 16:57:47 Wound Measurements Length: (cm) 0.7 Width: (cm) 0.8 Depth: (cm) 0.1 Area: (cm) 0.44 Volume: (cm) 0.044 % Reduction in Area: 84.4% % Reduction in Volume: 92.2% Epithelialization: Medium (34-66%) Tunneling: No Undermining: No Wound Description Classification: Full Thickness Without Exposed Support Structures Wound Margin: Distinct, outline attached Exudate Amount: Large Exudate Type: Serous Exudate Color: amber Foul Odor After Cleansing: No Slough/Fibrino No Wound  Bed Granulation Amount: Large (67-100%) Exposed Structure Granulation Quality: Pink, Pale Fascia Exposed: No Necrotic Amount: None Present (0%) Fat Layer (Subcutaneous Tissue) Exposed: Yes Tendon Exposed: No Muscle Exposed: No Joint Exposed: No Bone Exposed: No Treatment Notes Wound #1 (Lower Leg) Wound Laterality: Right, Posterior Cleanser Peri-Wound Care Zinc Oxide Ointment 30g tube Discharge Instruction: Apply Zinc Oxide mixed with ketoconozole and TCA cream to irritated area posterior ankle Sween Lotion (Moisturizing lotion) Discharge Instruction: Apply moisturizing lotion to leg Topical Primary Dressing KerraCel Ag Gelling Fiber Dressing, 2x2 in (silver alginate) Discharge Instruction: Apply silver alginate to wound bed as instructed Secondary Dressing Woven Gauze Sponge, Non-Sterile 4x4 in Discharge Instruction: Apply over primary dressing as directed. ABD Pad, 5x9  Discharge Instruction: Apply over primary dressing in skin fold posterior ankle Secured With Compression Wrap Unnaboot w/Calamine, 4x10 (in/yd) Discharge Instruction: Apply Unnaboot as directed. May also use Miliken CoFlex Calamine 2 layer compression system as alternative. Compression Stockings Add-Ons Electronic Signature([name]) Signed: 02/25/2021 5:33:00 PM By: Lisa [name] Entered By: Lisa [name] on 02/25/2021 13:05:55 -------------------------------------------------------------------------------- Wound Assessment Details Patient Name: Date of Service: [name], [place] Lisa [name]. 02/25/2021 12:30 PM Medical Record Number: [id number] Patient Account Number: [id number] Date of Birth/Sex: Treating RN: 04-16-1941 (80 y.o. Lisa [name] Primary Care Shakirra Buehler: [name] Other Clinician: Referring Auriella Wieand: Treating Artelia Game/Extender: Lisa [name] in Treatment: 5 Wound Status Wound Number: 2 Primary Lymphedema Etiology: Wound Location: Left, Lateral Foot Wound  Open Wounding Event: Gradually Appeared Status: Date Acquired: 02/25/2021 Comorbid Lymphedema, Arrhythmia, Congestive Heart Failure, Coronary Weeks Of Treatment: 0 History: Artery Disease, Hypertension, Myocardial Infarction, Gout, Clustered Wound: No Osteoarthritis, Received Chemotherapy Photos Photo Uploaded By: Lisa [name] on 02/25/2021 16:56:59 Wound Measurements Length: (cm) 4 Width: (cm) 2.5 Depth: (cm) 0.1 Area: (cm) 7.854 Volume: (cm) 0.785 % Reduction in Area: % Reduction in Volume: Epithelialization: None Tunneling: No Undermining: No Wound Description Classification: Full Thickness Without Exposed Support Structures Wound Margin: Distinct, outline attached Exudate Amount: Large Exudate Type: Serous Exudate Color: amber Foul Odor After Cleansing: No Slough/Fibrino No Wound Bed Granulation Amount: Large (67-100%) Exposed Structure Granulation Quality: Red Fascia Exposed: No Necrotic Amount: None Present (0%) Fat Layer (Subcutaneous Tissue) Exposed: Yes Tendon Exposed: No Muscle Exposed: No Joint Exposed: No Bone Exposed: No Electronic Signature([name]) Signed: 02/25/2021 5:33:00 PM By: Lisa [name] Entered By: Lisa [name] on 02/25/2021 13:04:35 -------------------------------------------------------------------------------- Vitals Details Patient Name: Date of Service: [name], [place] Lisa [name]. 02/25/2021 12:30 PM Medical Record Number: [id number] Patient Account Number: [id number] Date of Birth/Sex: Treating RN: 01/08/1941 (80 y.o. Lisa [name] Primary Care Noretta Frier: [name] Other Clinician: Referring Taron Mondor: Treating Rashawd Laskaris/Extender: Lisa [name] in Treatment: 5 Vital Signs Time Taken: 13:00 Temperature (F): 98.4 Height (in): 62 Pulse (bpm): 66 Weight (lbs): 268 Respiratory Rate (breaths/min): 20 Body Mass Index (BMI): 49 Blood Pressure (mmHg): 179/88 Reference Range: 80 - 120 mg / dl Electronic  Signature([name]) Signed: 02/25/2021 5:33:00 PM By: Lisa [name] Entered By: Lisa [name] on 02/25/2021 13:02:26

## 2021-02-26 ENCOUNTER — Encounter: Payer: Medicare Other | Admitting: Occupational Therapy

## 2021-02-27 ENCOUNTER — Telehealth: Payer: Self-pay

## 2021-02-27 NOTE — Telephone Encounter (Signed)
I spoke with the patient and she agreed to send missed ICM transmission. ?

## 2021-02-28 ENCOUNTER — Encounter (HOSPITAL_BASED_OUTPATIENT_CLINIC_OR_DEPARTMENT_OTHER): Payer: Medicare Other | Admitting: Internal Medicine

## 2021-02-28 ENCOUNTER — Encounter: Payer: Medicare Other | Admitting: Occupational Therapy

## 2021-02-28 ENCOUNTER — Other Ambulatory Visit: Payer: Self-pay

## 2021-02-28 DIAGNOSIS — L97211 Non-pressure chronic ulcer of right calf limited to breakdown of skin: Secondary | ICD-10-CM | POA: Diagnosis not present

## 2021-02-28 NOTE — Progress Notes (Signed)
NYLIA, SWEIGART ([id number]) Visit Report for 02/28/2021 [facility] Details Patient Name: Date of Service: [name] IS, [name] 02/28/2021 Medical Record Number: [id number] Patient Account Number: [id number] Date of Birth/Sex: Treating RN: 12-15-1940 (80 y.o. Lisa [name], Meta.Reding Primary Care Provider: [name] Other Clinician: Referring Provider: Treating Provider/Extender: [name] in Treatment: 5 Diagnosis Coding ICD-10 Codes Code Description I89.0 Lymphedema, not elsewhere classified L97.211 Non-pressure chronic ulcer of right calf limited to breakdown of skin L97.828 Non-pressure chronic ulcer of other part of left lower leg with other specified severity L03.116 Cellulitis of left lower limb Facility Procedures CPT4 Code Description Modifier Quantity [id number] - APPLY UNNA BOOT/PROFO BILATERAL 1 Electronic Signature(s) Signed: 02/28/2021 2:18:48 PM By: [name] Signed: 02/28/2021 3:17:49 PM By: Kalman Shan DO Entered By: [name] on 02/28/2021 13:20:31

## 2021-02-28 NOTE — Progress Notes (Signed)
Lisa [name], Lisa [name] ([id number]) Visit Report for 02/28/2021 Arrival Information Details Patient Name: Date of Service: Lisa [name], [name] 02/28/2021 11:00 A M Medical Record Number: [id number] Patient Account Number: [id number] Date of Birth/Sex: Treating RN: June 20, 1941 (80 y.o. Lisa [name], Lisa [name] Primary Care [name]: [name] Other Clinician: Referring [name]: Treating [name]/Extender: [name] in Treatment: 5 Visit Information History Since Last Visit Added or deleted any medications: No Patient Arrived: Lisa [name] Any new allergies or adverse reactions: No Arrival Time: 11:25 Had a fall or experienced change in No Accompanied By: self activities of daily living that may affect Transfer Assistance: None risk of falls: Patient Identification Verified: Yes Signs or symptoms of abuse/neglect since last visito No Secondary Verification Process Completed: Yes Hospitalized since last visit: No Patient Requires Transmission-Based Precautions: No Implantable device outside of the clinic excluding No Patient Has Alerts: Yes cellular tissue based products placed in the center Patient Alerts: Patient on Blood Thinner since last visit: Defibrillator Has Dressing in Place as Prescribed: Yes ABI=Lisa [name] Bilaterally Has Compression in Place as Prescribed: Yes Pain Present Now: No Electronic Signature(s) Signed: 02/28/2021 2:18:48 PM By: [name] Entered By: [name] on 02/28/2021 13:17:22 -------------------------------------------------------------------------------- Compression Therapy Details Patient Name: Date of Service: Lisa [name] IS, [name] 02/28/2021 11:00 A M Medical Record Number: [id number] Patient Account Number: [id number] Date of Birth/Sex: Treating RN: [birth year] (80 y.o. Lisa [name] Primary Care Sricharan Lacomb: [name] Other Clinician: Referring Matilda Fleig: Treating Hillis Mcphatter/Extender: [name] in Treatment: 5 Compression Therapy Performed for Wound Assessment: Wound #1 Right,Posterior Lower Leg Performed By: Clinician [name], RN Compression Type: Rolena Infante Electronic Signature(s) Signed: 02/28/2021 2:18:48 PM By: [name] Entered By: [name] on 02/28/2021 13:19:17 -------------------------------------------------------------------------------- Compression Therapy Details Patient Name: Date of Service: Lisa Lisa Hammans, [name] 02/28/2021 11:00 A M Medical Record Number: [id number] Patient Account Number: [id number] Date of Birth/Sex: Treating RN: 11/29/1940 (80 y.o. Lisa [name] Primary Care Berwyn Bigley: [name] Other Clinician: Referring Aritha Huckeba: Treating Konnie Noffsinger/Extender: [name] in Treatment: 5 Compression Therapy Performed for Wound Assessment: Wound #2 Left,Medial Foot Performed By: Clinician [name], RN Compression Type: Rolena Infante Electronic Signature(s) Signed: 02/28/2021 2:18:48 PM By: [name] Entered By: [name] on 02/28/2021 13:19:17 -------------------------------------------------------------------------------- Encounter Discharge Information Details Patient Name: Date of Service: Lisa [name], [name] 02/28/2021 11:00 A M Medical Record Number: [id number] Patient Account Number: [id number] Date of Birth/Sex: Treating RN: 07/16/1941 (80 y.o. Lisa [name] Primary Care Skylur Fuston: [name] Other Clinician: Referring [name]: Treating [name]/Extender: [name] in Treatment: 5 Encounter Discharge Information Items Discharge Condition: Stable Ambulatory Status: Cane Discharge Destination: Home Transportation: Private Auto Accompanied By: self Schedule Follow-up Appointment: Yes Clinical Summary of Care: Electronic Signature(s) Signed: 02/28/2021 2:18:48 PM By: [name] Entered By: [name] on 02/28/2021  13:20:01 -------------------------------------------------------------------------------- Patient/Caregiver Education Details Patient Name: Date of Service: Lisa Lisa Hammans, [name] 7/22/2022andnbsp11:00 [name] Number: [id number] Patient Account Number: [id number] Date of Birth/Gender: Treating RN: February 08, 1941 (80 y.o. Lisa [name] Primary Care Physician: [name] Other Clinician: Referring Physician: Treating Physician/Extender: [name] in Treatment: 5 Education Assessment Education Provided To: Patient Education Topics Provided Wound/Skin Impairment: Handouts: Caring for Your Ulcer Methods: Explain/Verbal Responses: Reinforcements needed Electronic Signature(s) Signed: 02/28/2021 2:18:48 PM By: [name] Entered By: [name] on 02/28/2021 13:19:43 -------------------------------------------------------------------------------- Wound Assessment Details Patient Name: Date of Service: Lisa  [name], [name] 02/28/2021 11:00 A M Medical Record Number: [id number] Patient Account Number: [id number] Date of Birth/Sex: Treating RN: 10-31-1940 (80 y.o. Lisa [name], Lisa [name] Primary Care Axelle Szwed: [name] Other Clinician: Referring Tiffanee Mcnee: Treating Toshua Honsinger/Extender: [name] in Treatment: 5 Wound Status Wound Number: 1 Primary Lymphedema Etiology: Wound Location: Right, Posterior Lower Leg Wound Open Wounding Event: Gradually Appeared Status: Date Acquired: 09/30/2020 Comorbid Lymphedema, Arrhythmia, Congestive Heart Failure, Coronary Weeks Of Treatment: 5 History: Artery Disease, Hypertension, Myocardial Infarction, Gout, Clustered Wound: No Osteoarthritis, Received Chemotherapy Wound Measurements Length: (cm) 0.7 Width: (cm) 0.8 Depth: (cm) 0.1 Area: (cm) 0.44 Volume: (cm) 0.044 % Reduction in Area: 84.4% % Reduction in Volume: 92.2% Epithelialization: Medium  (34-66%) Tunneling: No Undermining: No Wound Description Classification: Full Thickness Without Exposed Support Structures Wound Margin: Distinct, outline attached Exudate Amount: Large Exudate Type: Purulent Exudate Color: yellow, brown, green Foul Odor After Cleansing: No Slough/Fibrino No Wound Bed Granulation Amount: Large (67-100%) Exposed Structure Granulation Quality: Pink, Pale Fascia Exposed: No Necrotic Amount: None Present (0%) Fat Layer (Subcutaneous Tissue) Exposed: Yes Tendon Exposed: No Muscle Exposed: No Joint Exposed: No Bone Exposed: No Treatment Notes Wound #1 (Lower Leg) Wound Laterality: Right, Posterior Cleanser Peri-Wound Care Zinc Oxide Ointment 30g tube Discharge Instruction: Apply Zinc Oxide mixed with ketoconozole and TCA cream to irritated area posterior ankle Sween Lotion (Moisturizing lotion) Discharge Instruction: Apply moisturizing lotion to leg Topical Primary Dressing KerraCel Ag Gelling Fiber Dressing, 2x2 in (silver alginate) Discharge Instruction: Apply silver alginate to wound bed as instructed Secondary Dressing Woven Gauze Sponge, Non-Sterile 4x4 in Discharge Instruction: Apply over primary dressing as directed. ABD Pad, 5x9 Discharge Instruction: Apply over primary dressing in skin fold posterior ankle Secured With Compression Wrap Unnaboot w/Calamine, 4x10 (in/yd) Discharge Instruction: Apply Unnaboot as directed. May also use Miliken CoFlex Calamine 2 layer compression system as alternative. Compression Stockings Add-Ons Electronic Signature(s) Signed: 02/28/2021 2:18:48 PM By: [name] Entered By: [name] on 02/28/2021 13:18:04 -------------------------------------------------------------------------------- Wound Assessment Details Patient Name: Date of Service: Lisa Clayton Bibles IS, [name] 02/28/2021 11:00 A M Medical Record Number: [id number] Patient Account Number: [id number] Date of Birth/Sex: Treating  RN: 1940-09-04 (80 y.o. Lisa [name] Primary Care Barnabas Henriques: [name] Other Clinician: Referring Thelma Viana: Treating Levonne Carreras/Extender: [name] in Treatment: 5 Wound Status Wound Number: 2 Primary Lymphedema Etiology: Wound Location: Left, Medial Foot Wound Open Wounding Event: Gradually Appeared Status: Date Acquired: 02/25/2021 Comorbid Lymphedema, Arrhythmia, Congestive Heart Failure, Coronary Weeks Of Treatment: 0 History: Artery Disease, Hypertension, Myocardial Infarction, Gout, Clustered Wound: No Osteoarthritis, Received Chemotherapy Wound Measurements Length: (cm) 4 Width: (cm) 2.5 Depth: (cm) 0.1 Area: (cm) 7.854 Volume: (cm) 0.785 % Reduction in Area: 0% % Reduction in Volume: 0% Epithelialization: Medium (34-66%) Tunneling: No Undermining: No Wound Description Classification: Full Thickness Without Exposed Support Structures Wound Margin: Distinct, outline attached Exudate Amount: Large Exudate Type: Purulent Exudate Color: yellow, brown, green Foul Odor After Cleansing: No Slough/Fibrino No Wound Bed Granulation Amount: Large (67-100%) Exposed Structure Granulation Quality: Red Fascia Exposed: No Necrotic Amount: None Present (0%) Fat Layer (Subcutaneous Tissue) Exposed: Yes Tendon Exposed: No Muscle Exposed: No Joint Exposed: No Bone Exposed: No Assessment Notes less reddness noted to left foot and leg. Per Patient pain has decreased. Treatment Notes Wound #2 (Foot) Wound Laterality: Left, Medial Cleanser Peri-Wound Care Zinc Oxide Ointment 30g tube Discharge Instruction: Apply Zinc Oxide mixed with ketoconozole and TCA cream to irritated area posterior ankle Sween Lotion (  Moisturizing lotion) Discharge Instruction: Apply moisturizing lotion to leg Topical Primary Dressing KerraCel Ag Gelling Fiber Dressing, 2x2 in (silver alginate) Discharge Instruction: Apply silver alginate to wound bed as  instructed Secondary Dressing Woven Gauze Sponge, Non-Sterile 4x4 in Discharge Instruction: Apply over primary dressing as directed. ABD Pad, 5x9 Discharge Instruction: Apply over primary dressing in skin fold posterior ankle Secured With Compression Wrap Unnaboot w/Calamine, 4x10 (in/yd) Discharge Instruction: Apply Unnaboot as directed. May also use Miliken CoFlex Calamine 2 layer compression system as alternative. Compression Stockings Add-Ons Electronic Signature(s) Signed: 02/28/2021 2:18:48 PM By: [name] Entered By: [name] on 02/28/2021 13:18:51 -------------------------------------------------------------------------------- Vitals Details Patient Name: Date of Service: Lisa [name], [name] 02/28/2021 11:00 A M Medical Record Number: [id number] Patient Account Number: [id number] Date of Birth/Sex: Treating RN: 1940-10-03 (80 y.o. Lisa [name], Lisa [name] Primary Care Alleah Dearman: [name] Other Clinician: Referring Staphany Ditton: Treating Jaquitta Dupriest/Extender: [name] in Treatment: 5 Vital Signs Time Taken: 11:25 Temperature (F): 97.9 Height (in): 62 Pulse (bpm): 65 Weight (lbs): 268 Respiratory Rate (breaths/min): 20 Body Mass Index (BMI): 49 Blood Pressure (mmHg): 164/76 Reference Range: 80 - 120 mg / dl Electronic Signature(s) Signed: 02/28/2021 2:18:48 PM By: [name] Entered By: [name] on 02/28/2021 13:17:40

## 2021-03-03 ENCOUNTER — Encounter: Payer: Medicare Other | Admitting: Occupational Therapy

## 2021-03-04 ENCOUNTER — Ambulatory Visit (INDEPENDENT_AMBULATORY_CARE_PROVIDER_SITE_OTHER): Payer: Medicare Other

## 2021-03-04 DIAGNOSIS — I5022 Chronic systolic (congestive) heart failure: Secondary | ICD-10-CM | POA: Diagnosis not present

## 2021-03-04 DIAGNOSIS — Z95810 Presence of automatic (implantable) cardiac defibrillator: Secondary | ICD-10-CM | POA: Diagnosis not present

## 2021-03-05 ENCOUNTER — Telehealth: Payer: Self-pay

## 2021-03-05 ENCOUNTER — Encounter: Payer: Medicare Other | Admitting: Occupational Therapy

## 2021-03-05 NOTE — Telephone Encounter (Signed)
Remote ICM transmission received.  Attempted call to patient regarding ICM remote transmission and left detailed message per DPR.  Advised to return call for any fluid symptoms or questions. Next ICM remote transmission scheduled 04/07/2021.

## 2021-03-05 NOTE — Progress Notes (Signed)
EPIC Encounter for ICM Monitoring  Patient Name: Lisa [name] is a 80 y.o. female Date: 03/05/2021 Primary Care Physican: [name], MD Primary Cardiologist: Harrington Challenger Electrophysiologist: Santina Evans Pacing: 98.2%         01/10/2021 Office Weight: 265 lbs                                                            Attempted call to patient and unable to reach.  Left detailed message per DPR regarding transmission. Transmission reviewed.    Optivol thoracic impedance normal.   Prescribed:  Torsemide 20 mg take 1 tablet every other day Potassium 10 mEq take 1 tablet every other day   Labs: 01/02/2021 Creatinine 1.44, BUN 20, Potassium 3.9, Sodium 142, GFR 37 11/16/2020 Creatinine 1.56, BUN 20, Potassium 4.6, Sodium 140, GFR 34 09/13/2020 Creatinine 1.32, BUN 21, Potassium 3.8, Sodium 141, GFR 41 A complete set of results can be found in Results Review.   Recommendations:  Left voice mail with ICM number and encouraged to call if experiencing any fluid symptoms.   Follow-up plan: ICM clinic phone appointment on 04/07/2021.   91 day device clinic remote transmission 04/30/2021.     EP/Cardiology Office Visits:  Recall 06/07/2021 with Dr. Harrington Challenger.  03/25/2021 with Dr Lovena Le.   Copy of ICM check sent to Dr. Lovena Le.   3 month ICM trend: 02/28/2021.    1 Year ICM trend:       [name], RN 03/05/2021 9:34 AM

## 2021-03-07 ENCOUNTER — Encounter: Payer: Medicare Other | Admitting: Occupational Therapy

## 2021-03-10 ENCOUNTER — Encounter (HOSPITAL_BASED_OUTPATIENT_CLINIC_OR_DEPARTMENT_OTHER): Payer: Medicare Other | Attending: Internal Medicine | Admitting: Internal Medicine

## 2021-03-10 ENCOUNTER — Other Ambulatory Visit: Payer: Self-pay

## 2021-03-10 ENCOUNTER — Encounter: Payer: Medicare Other | Admitting: Occupational Therapy

## 2021-03-10 DIAGNOSIS — I130 Hypertensive heart and chronic kidney disease with heart failure and stage 1 through stage 4 chronic kidney disease, or unspecified chronic kidney disease: Secondary | ICD-10-CM | POA: Insufficient documentation

## 2021-03-10 DIAGNOSIS — I5022 Chronic systolic (congestive) heart failure: Secondary | ICD-10-CM | POA: Diagnosis not present

## 2021-03-10 DIAGNOSIS — C9290 Myeloid leukemia, unspecified, not having achieved remission: Secondary | ICD-10-CM | POA: Insufficient documentation

## 2021-03-10 DIAGNOSIS — L97828 Non-pressure chronic ulcer of other part of left lower leg with other specified severity: Secondary | ICD-10-CM | POA: Insufficient documentation

## 2021-03-10 DIAGNOSIS — Z8616 Personal history of COVID-19: Secondary | ICD-10-CM | POA: Insufficient documentation

## 2021-03-10 DIAGNOSIS — Z95810 Presence of automatic (implantable) cardiac defibrillator: Secondary | ICD-10-CM | POA: Insufficient documentation

## 2021-03-10 DIAGNOSIS — Z85820 Personal history of malignant melanoma of skin: Secondary | ICD-10-CM | POA: Insufficient documentation

## 2021-03-10 DIAGNOSIS — L97211 Non-pressure chronic ulcer of right calf limited to breakdown of skin: Secondary | ICD-10-CM

## 2021-03-10 DIAGNOSIS — I890 Lymphedema, not elsewhere classified: Secondary | ICD-10-CM | POA: Insufficient documentation

## 2021-03-10 DIAGNOSIS — L03115 Cellulitis of right lower limb: Secondary | ICD-10-CM | POA: Insufficient documentation

## 2021-03-10 DIAGNOSIS — L97212 Non-pressure chronic ulcer of right calf with fat layer exposed: Secondary | ICD-10-CM | POA: Insufficient documentation

## 2021-03-10 DIAGNOSIS — N183 Chronic kidney disease, stage 3 unspecified: Secondary | ICD-10-CM | POA: Insufficient documentation

## 2021-03-10 NOTE — Progress Notes (Signed)
MORGANN, TELEP ([id number]) Visit Report for 03/10/2021 Chief Complaint Document Details Patient Name: Date of Service: Lisa [name] IS, [name] 03/10/2021 1:15 PM Medical Record Number: [id number] Patient Account Number: [id number] Date of Birth/Sex: Treating RN: 02/23/1941 (80 y.o. Lisa [name] Primary Care Provider: Tilman [name] Other Clinician: Referring Provider: Treating Provider/Extender: Lisa [name] in Treatment: 7 Information Obtained from: Patient Chief Complaint 01/20/2021; patient is here for review of the wound on the right posterior calf however also has weeping de-epithelialized areas on the left leg as well in the setting of severe bilateral lymphedema Electronic Signature(s) Signed: 03/10/2021 3:08:13 PM By: Lisa Shan DO Entered By: Lisa [name] on 03/10/2021 15:02:32 -------------------------------------------------------------------------------- HPI Details Patient Name: Date of Service: Lisa [name], [name] 03/10/2021 1:15 PM Medical Record Number: [id number] Patient Account Number: [id number] Date of Birth/Sex: Treating RN: November 19, 1940 (80 y.o. Lisa [name] Primary Care Provider: Tilman [name] Other Clinician: Referring Provider: Treating Provider/Extender: Lisa [name] in Treatment: 7 History of Present Illness HPI Description: ADMISSION 01/20/2021 This is a 80 year old woman who tells me that she has had problems with lymphedema since the 1970s. She does not have, or has not been given, a clear explanation for this. She has been followed for many years at various lymphedema clinics including in [place] and then in [place] and now sees Lisa [name] at [place]. Developed a wound on the right posterior calf about a month ago. When that happened she is no longer able to have lymphedema therapy on this leg. She has been seeing her primary doctor who dressed this with DuoDERM  and more recently with an Haematologist. The wounds were also debrided on the posterior The patient has 79 year old compression pumps but I am not sure of the frequency of their use she also has juxta lites but again I am not sure how frequently unreliably the patient is using them. She has open wounds on the right posterior and weeping de-epithelialized area predominantly on the left anterior and left anterior lateral. 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 tach and has a cardioverter defibrillator, gout and obstructive sleep apnea. She also has had a melanoma on the right leg and there is an extensive scar in the middle of lymphedema on the right anterior lower leg ABIs in our clinic were noncompressible bilaterally 6/20; patient's wound on the right posterior calf is better. She brought in her compression pumps today and says that he still do not fit her leg. She also has external compression stockings at home but she did not bring those in today. We have her in 4-layer compression she is actually doing quite well much less swelling in the right leg She has old melanoma surgery on her right leg but she is quite adamant that she did not have any radiation of this was back in the 1970s 6/29; right posterior calf wound. This appears somewhat dry but looks as though it is attempting to epithelialize. We have better edema control. We have somebody coming out to go over her compression pumps with her on 7/12. 7/15; patient presents because she is having increased irritation to her ankles. She has reported more weeping than usual to her lower extremities. She would like to switch from the Coflex used at last clinic visit to a 4-layer compression wrap. She also states she  does not want to use Hydrofera Blue. Her compression pumps are coming in  on Wednesday. 7/19; I have not seen this patient in quite a while. At that point we are dealing with an area on the right posterior calf in the setting of severe bilateral lymphedema. I do not remember they are actually being a problem on the left leg. However Lisa [name] reports significant erythema and weeping on the left lower leg last week. We initially were using Coflex and change this to 4-layer compression last week although this is not really helped. She comes in today with a lot of discomfort around the left ankle and marked weeping edema. Complicating things she is traveling to [facility] leaving on Sunday. She says that some members of her family or physicians and presumably they might be able to help with an Ace wrap beyond that I do not know what were going to do here. She has made arrangements for a visitation for her compression pumps this Thursday. 8/1; patient presents for 2-week follow-up. She has been using Unna boots with silver alginate underneath. She reports improvement in her wounds. She overall reports feeling well. She has no issues or complaints today. She denies signs of infection. Electronic Signature(s) Signed: 03/10/2021 3:08:13 PM By: Lisa Shan DO Entered By: Lisa [name] on 03/10/2021 15:03:33 -------------------------------------------------------------------------------- Physical Exam Details Patient Name: Date of Service: Lisa [name], [name] 03/10/2021 1:15 PM Medical Record Number: [id number] Patient Account Number: [id number] Date of Birth/Sex: Treating RN: 1940/12/10 (80 y.o. Lisa [name] Primary Care Provider: Tilman [name] Other Clinician: Referring Provider: Treating Provider/Extender: Lisa [name] in Treatment: 7 Constitutional respirations regular, non-labored and within target range for patient.[name] Psychiatric pleasant and cooperative. Notes Right lower extremity: Small open wound with granulation  tissue T the posterior aspect. o Left lower extremity: Epithelialization to the medial ankle from previous wound site. lymphedema bilaterally No obvious signs of infection Electronic Signature(s) Signed: 03/10/2021 3:08:13 PM By: Lisa Shan DO Entered By: Lisa [name] on 03/10/2021 15:05:22 -------------------------------------------------------------------------------- Physician Orders Details Patient Name: Date of Service: Lisa [name], [name] 03/10/2021 1:15 PM Medical Record Number: [id number] Patient Account Number: [id number] Date of Birth/Sex: Treating RN: Sep 21, 1940 (80 y.o. Lisa [name] Primary Care Provider: Tilman [name] Other Clinician: Referring Provider: Treating Provider/Extender: Lisa [name] in Treatment: 7 Verbal / Phone Orders: No Diagnosis Coding ICD-10 Coding Code Description I89.0 Lymphedema, not elsewhere classified L97.211 Non-pressure chronic ulcer of right calf limited to breakdown of skin L97.828 Non-pressure chronic ulcer of other part of left lower leg with other specified severity L03.116 Cellulitis of left lower limb Follow-up Appointments ppointment in 2 weeks. - with Dr. Dellia Nims Return A Nurse Visit: - 1 week for rewrap Bathing/ Shower/ Hygiene May shower with protection but do not get wound dressing(s) wet. Edema Control - Lymphedema / SCD / Other Bilateral Lower Extremities Lymphedema Pumps. Use Lymphedema pumps on leg(s) 2-3 times a day for 45-60 minutes. If wearing any wraps or hose, do not remove them. Continue exercising as instructed. - use over compression wraps Elevate legs to the level of the heart or above for 30 minutes daily and/or when sitting, a frequency of: - throughout the day Avoid standing for long periods of time. Exercise regularly Non Wound Condition Left Lower Extremity pply the following to affected area as directed: - Apply silver alginate to any weeping areas on left leg,  cover with ABDs and Unna Boot. use A mixture of  TCA, ABD pad in fold posterior ankles Wound Treatment Wound #1 - Lower Leg Wound Laterality: Right, Posterior Peri-Wound Care: Zinc Oxide Ointment 30g tube 2 x Per Week/15 Days Discharge Instructions: Apply Zinc Oxide mixed with ketoconozole and TCA cream to irritated area posterior ankle Peri-Wound Care: Sween Lotion (Moisturizing lotion) 2 x Per Week/15 Days Discharge Instructions: Apply moisturizing lotion to leg Prim Dressing: KerraCel Ag Gelling Fiber Dressing, 2x2 in (silver alginate) 2 x Per Week/15 Days ary Discharge Instructions: Apply silver alginate to wound bed as instructed Secondary Dressing: Woven Gauze Sponge, Non-Sterile 4x4 in 2 x Per Week/15 Days Discharge Instructions: Apply over primary dressing as directed. Secondary Dressing: ABD Pad, 5x9 2 x Per Week/15 Days Discharge Instructions: Apply over primary dressing in skin fold posterior ankle Compression Wrap: Unnaboot w/Calamine, 4x10 (in/yd) 2 x Per Week/15 Days Discharge Instructions: Apply Unnaboot as directed. May also use Miliken CoFlex Calamine 2 layer compression system as alternative. Electronic Signature(s) Signed: 03/10/2021 3:08:13 PM By: Lisa Shan DO Entered By: Lisa [name] on 03/10/2021 15:05:37 -------------------------------------------------------------------------------- Problem List Details Patient Name: Date of Service: Lisa [name], [name] 03/10/2021 1:15 PM Medical Record Number: [id number] Patient Account Number: [id number] Date of Birth/Sex: Treating RN: Oct 04, 1940 (80 y.o. Lisa [name] Primary Care Provider: Tilman [name] Other Clinician: Referring Provider: Treating Provider/Extender: Lisa [name] in Treatment: 7 Active Problems ICD-10 Encounter Code Description Active Date MDM Diagnosis I89.0 Lymphedema, not elsewhere classified 01/20/2021 No Yes L97.211 Non-pressure chronic ulcer of right  calf limited to breakdown of skin 01/20/2021 No Yes L97.828 Non-pressure chronic ulcer of other part of left lower leg with other specified 01/20/2021 No Yes severity L03.116 Cellulitis of left lower limb 02/25/2021 No Yes Inactive Problems Resolved Problems Electronic Signature(s) Signed: 03/10/2021 3:08:13 PM By: Lisa Shan DO Entered By: Lisa [name] on 03/10/2021 14:58:48 -------------------------------------------------------------------------------- Progress Note Details Patient Name: Date of Service: Lisa [name], [name] 03/10/2021 1:15 PM Medical Record Number: [id number] Patient Account Number: [id number] Date of Birth/Sex: Treating RN: 09-Jun-1941 (80 y.o. Lisa [name] Primary Care Provider: Tilman [name] Other Clinician: Referring Provider: Treating Provider/Extender: Lisa [name] in Treatment: 7 Subjective Chief Complaint Information obtained from Patient 01/20/2021; patient is here for review of the wound on the right posterior calf however also has weeping de-epithelialized areas on the left leg as well in the setting of severe bilateral lymphedema History of Present Illness (HPI) ADMISSION 01/20/2021 This is a 80 year old woman who tells me that she has had problems with lymphedema since the 1970s. She does not have, or has not been given, a clear explanation for this. She has been followed for many years at various lymphedema clinics including in [place] and then in [place] and now sees Lisa [name] at [place]. Developed a wound on the right posterior calf about a month ago. When that happened she is no longer able to have lymphedema therapy on this leg. She has been seeing her primary doctor who dressed this with DuoDERM and more recently with an Haematologist. The wounds were also debrided on the posterior The patient has 79 year old compression pumps but I am not sure of the frequency of their use she also has juxta lites  but again I am not sure how frequently unreliably the patient is using them. She has open wounds on the right posterior and weeping de-epithelialized area predominantly on the left anterior and left anterior lateral. 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 tach and has a cardioverter defibrillator, gout and obstructive sleep apnea. She also has had a melanoma on the right leg and there is an extensive scar in the middle of lymphedema on the right anterior lower leg ABIs in our clinic were noncompressible bilaterally 6/20; patient's wound on the right posterior calf is better. She brought in her compression pumps today and says that he still do not fit her leg. She also has external compression stockings at home but she did not bring those in today. We have her in 4-layer compression she is actually doing quite well much less swelling in the right leg She has old melanoma surgery on her right leg but she is quite adamant that she did not have any radiation of this was back in the 1970s 6/29; right posterior calf wound. This appears somewhat dry but looks as though it is attempting to epithelialize. We have better edema control. We have somebody coming out to go over her compression pumps with her on 7/12. 7/15; patient presents because she is having increased irritation to her ankles. She has reported more weeping than usual to her lower extremities. She would like to switch from the Coflex used at last clinic visit to a 4-layer compression wrap. She also states she does not want to use Hydrofera Blue. Her compression pumps are coming in on Wednesday. 7/19; I have not seen this patient in quite a while. At that point we are dealing with an area on the right posterior calf in the setting of severe bilateral lymphedema. I do not remember  they are actually being a problem on the left leg. However Dr. Heber [place] reports significant erythema and weeping on the left lower leg last week. We initially were using Coflex and change this to 4-layer compression last week although this is not really helped. She comes in today with a lot of discomfort around the left ankle and marked weeping edema. Complicating things she is traveling to [facility] leaving on Sunday. She says that some members of her family or physicians and presumably they might be able to help with an Ace wrap beyond that I do not know what were going to do here. She has made arrangements for a visitation for her compression pumps this Thursday. 8/1; patient presents for 2-week follow-up. She has been using Unna boots with silver alginate underneath. She reports improvement in her wounds. She overall reports feeling well. She has no issues or complaints today. She denies signs of infection. Patient History Information obtained from Patient. Family History Cancer - Father, Heart Disease - Paternal Grandparents,Maternal Grandparents,Mother, Hypertension - Maternal Grandparents,Paternal Grandparents,Mother, No family history of Diabetes, Hereditary Spherocytosis, Kidney Disease, Lung Disease, Seizures, Stroke, Thyroid Problems, Tuberculosis. Social History Never smoker, Marital Status - Divorced, Alcohol Use - Rarely, Caffeine Use - Never. 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 Vitals Time Taken: 2:18 PM, Height: 62 in, Weight: 268 lbs, BMI: 49, Temperature: 98.5 F, Pulse: 74 bpm, Respiratory Rate: 20 breaths/min, Blood Pressure: 157/71 mmHg. Psychiatric pleasant  and cooperative. General Notes: Right lower extremity: Small open wound with granulation tissue T the posterior aspect. Left lower extremity: Epithelialization to the medial o ankle from previous wound site. lymphedema bilaterally No obvious signs of infection Integumentary (Hair, Skin) Wound #1 status is Open. Original cause of wound was Gradually Appeared. The date acquired was: 09/30/2020. The wound has been in treatment 7 weeks. The wound is located on the Right,Posterior Lower Leg. The wound measures 0.7cm length x 0.7cm width x 0.1cm depth; 0.385cm^2 area and 0.038cm^3 volume. There is Fat Layer (Subcutaneous Tissue) exposed. There is no tunneling or undermining noted. There is a large amount of serosanguineous drainage noted. The wound margin is distinct with the outline attached to the wound base. There is large (67-100%) red, pink granulation within the wound bed. There is a small (1-33%) amount of necrotic tissue within the wound bed including Adherent Slough. Wound #2 status is Healed - Epithelialized. Original cause of wound was Gradually Appeared. The date acquired was: 02/25/2021. The wound has been in treatment 1 weeks. The wound is located on the Left,Medial Foot. The wound measures 0cm length x 0cm width x 0cm depth; 0cm^2 area and 0cm^3 volume. There is no tunneling or undermining noted. There is a none present amount of drainage noted. The wound margin is distinct with the outline attached to the wound base. There is no granulation within the wound bed. There is no necrotic tissue within the wound bed. Assessment Active Problems ICD-10 Lymphedema, not elsewhere classified Non-pressure chronic ulcer of right calf limited to breakdown  of skin Non-pressure chronic ulcer of other part of left lower leg with other specified severity Cellulitis of left lower limb Patient has shown improvement in her wound since last clinic visit with the use of a Unna boot and silver alginate. Her left lower extremity wound is healed. I recommended continuing the Unna boot on the right side with silver alginate. Procedures Wound #1 Pre-procedure diagnosis of Wound #1 is a Lymphedema located on the Right,Posterior Lower Leg . There was a Haematologist Compression Therapy Procedure by Levan Hurst, RN. Post procedure Diagnosis Wound #1: Same as Pre-Procedure There was a Haematologist Compression Therapy Procedure by Levan Hurst, RN. Post procedure Diagnosis Wound #: Same as Pre-Procedure Plan Follow-up Appointments: Return Appointment in 2 weeks. - with Dr. Dellia Nims Nurse Visit: - 1 week for rewrap Bathing/ Shower/ Hygiene: May shower with protection but do not get wound dressing(s) wet. Edema Control - Lymphedema / SCD / Other: Lymphedema Pumps. Use Lymphedema pumps on leg(s) 2-3 times a day for 45-60 minutes. If wearing any wraps or hose, do not remove them. Continue exercising as instructed. - use over compression wraps Elevate legs to the level of the heart or above for 30 minutes daily and/or when sitting, a frequency of: - throughout the day Avoid standing for long periods of time. Exercise regularly Non Wound Condition: Apply the following to affected area as directed: - Apply silver alginate to any weeping areas on left leg, cover with ABDs and Unna Boot. use mixture of TCA, ABD pad in fold posterior ankles WOUND #1: - Lower Leg Wound Laterality: Right, Posterior Peri-Wound Care: Zinc Oxide Ointment 30g tube 2 x Per Week/15 Days Discharge Instructions: Apply Zinc Oxide mixed with ketoconozole and TCA cream to irritated area posterior ankle Peri-Wound Care: Sween Lotion (Moisturizing lotion) 2 x Per Week/15 Days Discharge Instructions:  Apply moisturizing lotion to leg Prim Dressing: KerraCel Ag Gelling Fiber Dressing, 2x2 in (silver alginate)  2 x Per Week/15 Days ary Discharge Instructions: Apply silver alginate to wound bed as instructed Secondary Dressing: Woven Gauze Sponge, Non-Sterile 4x4 in 2 x Per Week/15 Days Discharge Instructions: Apply over primary dressing as directed. Secondary Dressing: ABD Pad, 5x9 2 x Per Week/15 Days Discharge Instructions: Apply over primary dressing in skin fold posterior ankle Com pression Wrap: Unnaboot w/Calamine, 4x10 (in/yd) 2 x Per Week/15 Days Discharge Instructions: Apply Unnaboot as directed. May also use Miliken CoFlex Calamine 2 layer compression system as alternative. 1. Silver alginate under Unna boot 2. Follow-up in 1 week for nurse visit and 2 weeks with Dr. Dellia Nims Electronic Signature(s) Signed: 03/10/2021 3:08:13 PM By: Lisa Shan DO Entered By: Lisa [name] on 03/10/2021 15:06:47 -------------------------------------------------------------------------------- HxROS Details Patient Name: Date of Service: Lisa [name], [name] 03/10/2021 1:15 PM Medical Record Number: [id number] Patient Account Number: [id number] Date of Birth/Sex: Treating RN: 01/17/1941 (80 y.o. Lisa [name] Primary Care Provider: Tilman [name] Other Clinician: Referring Provider: Treating Provider/Extender: Lisa [name] in Treatment: 7 Information Obtained From Patient Hematologic/Lymphatic Medical History: Positive for: Lymphedema Cardiovascular Medical History: Positive for: Arrhythmia - Ventricular Fib; Congestive Heart Failure; Coronary Artery Disease; Hypertension; Myocardial Infarction Past Medical History Notes: Has Defibrillator Endocrine Medical History: Past Medical History Notes: Hypothryoidsm Genitourinary Medical History: Past Medical History Notes: CKD state G3b Musculoskeletal Medical History: Positive for: Gout;  Osteoarthritis Oncologic Medical History: Positive for: Received Chemotherapy Past Medical History Notes: Leukemia (CML) Immunizations Pneumococcal Vaccine: Received Pneumococcal Vaccination: No Implantable Devices Yes Family and Social History Cancer: Yes - Father; Diabetes: No; Heart Disease: Yes - Paternal Grandparents,Maternal Grandparents,Mother; Hereditary Spherocytosis: No; Hypertension: Yes - Maternal Grandparents,Paternal Grandparents,Mother; Kidney Disease: No; Lung Disease: No; Seizures: No; Stroke: No; Thyroid Problems: No; Tuberculosis: No; Never smoker; Marital Status - Divorced; Alcohol Use: Rarely; Caffeine Use: Never; Financial Concerns: No; Food, Clothing or Shelter Needs: No; Support System Lacking: No; Transportation Concerns: No Electronic Signature(s) Signed: 03/10/2021 3:08:13 PM By: Lisa Shan DO Signed: 03/10/2021 5:51:46 PM By: Levan Hurst RN, BSN Entered By: Lisa [name] on 03/10/2021 15:03:47 -------------------------------------------------------------------------------- SuperBill Details Patient Name: Date of Service: Lisa [name], [name] 03/10/2021 Medical Record Number: [id number] Patient Account Number: [id number] Date of Birth/Sex: Treating RN: 1940-10-17 (80 y.o. Lisa [name] Primary Care Provider: Tilman [name] Other Clinician: Referring Provider: Treating Provider/Extender: Lisa [name] in Treatment: 7 Diagnosis Coding ICD-10 Codes Code Description I89.0 Lymphedema, not elsewhere classified L97.211 Non-pressure chronic ulcer of right calf limited to breakdown of skin L97.828 Non-pressure chronic ulcer of other part of left lower leg with other specified severity L03.116 Cellulitis of left lower limb Facility Procedures CPT4 Code: [id number] Description: 29580 - APPLY UNNA BOOT/PROFO BILATERAL Modifier: Quantity: 1 Physician Procedures : CPT4 Code Description Modifier [id number] - WC PHYS  LEVEL 3 - EST PT ICD-10 Diagnosis Description L97.211 Non-pressure chronic ulcer of right calf limited to breakdown of skin I89.0 Lymphedema, not elsewhere classified L97.828 Non-pressure chronic  ulcer of other part of left lower leg with other specified severity Quantity: 1 Electronic Signature(s) Signed: 03/10/2021 3:08:13 PM By: Lisa Shan DO Entered By: Lisa [name] on 03/10/2021 15:07:05

## 2021-03-10 NOTE — Progress Notes (Signed)
Lisa [name] ([id number]) Visit Report for 03/10/2021 Arrival Information Details Patient Name: Date of Service: Lisa [name] IS, [name] 03/10/2021 1:15 PM Medical Record Number: [id number] Patient Account Number: [id number] Date of Birth/Sex: Treating RN: [birth year] (80 y.o. Lisa [name] Primary Care Lisa [name]: Lisa [name] Other Clinician: Referring Lisa [name]: Treating Lisa [name]/Extender: Lisa [name] in Treatment: 7 Visit Information History Since Last Visit Added or deleted any medications: No Patient Arrived: Ambulatory Any new allergies or adverse reactions: No Arrival Time: 14:17 Had a fall or experienced change in No Accompanied By: alone activities of daily living that may affect Transfer Assistance: None risk of falls: Patient Identification Verified: Yes Signs or symptoms of abuse/neglect since last visito No Secondary Verification Process Completed: Yes Implantable device outside of the clinic excluding No Patient Requires Transmission-Based Precautions: No cellular tissue based products placed in the center Patient Has Alerts: Yes since last visit: Patient Alerts: Patient on Blood Thinner Has Dressing in Place as Prescribed: Yes Defibrillator Has Compression in Place as Prescribed: Yes ABI=[place] Bilaterally Pain Present Now: No Electronic Signature(s) Signed: 03/10/2021 5:51:46 PM By: Lisa Hurst RN, BSN Entered By: Lisa [name] on 03/10/2021 14:18:12 -------------------------------------------------------------------------------- Compression Therapy Details Patient Name: Date of Service: [name], [name] 03/10/2021 1:15 PM Medical Record Number: [id number] Patient Account Number: [id number] Date of Birth/Sex: Treating RN: 14-Jul-1941 (80 y.o. Lisa [name] Primary Care Ethelene Closser: Lisa [name] Other Clinician: Referring Tiawanna Luchsinger: Treating Shannah Conteh/Extender: Lisa [name] in Treatment:  7 Compression Therapy Performed for Wound Assessment: Wound #1 Right,Posterior Lower Leg Performed By: Clinician Lisa Hurst, RN Compression Type: Rolena Infante Post Procedure Diagnosis Same as Pre-procedure Electronic Signature(s) Signed: 03/10/2021 5:51:46 PM By: Lisa Hurst RN, BSN Entered By: Lisa [name] on 03/10/2021 14:34:15 -------------------------------------------------------------------------------- Compression Therapy Details Patient Name: Date of Service: [name], [name] 03/10/2021 1:15 PM Medical Record Number: [id number] Patient Account Number: [id number] Date of Birth/Sex: Treating RN: Apr 12, 1941 (80 y.o. Lisa [name] Primary Care Ayomide Purdy: Lisa [name] Other Clinician: Referring Draysen Weygandt: Treating Davene Jobin/Extender: Lisa [name] in Treatment: 7 Compression Therapy Performed for Wound Assessment: NonWound Condition Lymphedema - Left Leg Performed By: Clinician Lisa Hurst, RN Compression Type: Rolena Infante Post Procedure Diagnosis Same as Pre-procedure Electronic Signature(s) Signed: 03/10/2021 5:51:46 PM By: Lisa Hurst RN, BSN Entered By: Lisa [name] on 03/10/2021 14:34:31 -------------------------------------------------------------------------------- Encounter Discharge Information Details Patient Name: Date of Service: [name], [name] 03/10/2021 1:15 PM Medical Record Number: [id number] Patient Account Number: [id number] Date of Birth/Sex: Treating RN: 06-Sep-1940 (80 y.o. Lisa [name] Primary Care Nuriya Stuck: Lisa [name] Other Clinician: Referring Lequan Dobratz: Treating Tanaiya Kolarik/Extender: Lisa [name] in Treatment: 7 Encounter Discharge Information Items Discharge Condition: Stable Ambulatory Status: Cane Discharge Destination: Home Transportation: Private Auto Accompanied By: alone Schedule Follow-up Appointment: Yes Clinical Summary of Care: Patient  Declined Electronic Signature(s) Signed: 03/10/2021 5:51:46 PM By: Lisa Hurst RN, BSN Entered By: Lisa [name] on 03/10/2021 14:54:36 -------------------------------------------------------------------------------- Lower Extremity Assessment Details Patient Name: Date of Service: [name], [name] 03/10/2021 1:15 PM Medical Record Number: [id number] Patient Account Number: [id number] Date of Birth/Sex: Treating RN: 24-Feb-1941 (80 y.o. Lisa [name] Primary Care Malissa Slay: Lisa [name] Other Clinician: Referring Rubena Roseman: Treating Angeliz Settlemyre/Extender: Lisa [name] in Treatment: 7 Edema Assessment Assessed: [Left: No] [Right: No] Edema: [Left: Yes] [Right: Yes] Calf Left: Right: Point of Measurement: 30 cm From Medial Instep 60 cm 66 cm Ankle Left: Right: Point  of Measurement: 6 cm From Medial Instep 33 cm 34 cm Vascular Assessment Pulses: Dorsalis Pedis Palpable: [Left:Yes] [Right:Yes] Electronic Signature(s) Signed: 03/10/2021 5:51:46 PM By: Lisa Hurst RN, BSN Entered By: Lisa [name] on 03/10/2021 14:18:36 -------------------------------------------------------------------------------- Multi Wound Chart Details Patient Name: Date of Service: [name], [name] 03/10/2021 1:15 PM Medical Record Number: [id number] Patient Account Number: [id number] Date of Birth/Sex: Treating RN: October 06, 1940 (80 y.o. Lisa [name] Primary Care Maurie Musco: Lisa [name] Other Clinician: Referring Terrionna Bridwell: Treating Stephanine Reas/Extender: Lisa [name] in Treatment: 7 Vital Signs Height(in): 62 Pulse(bpm): 81 Weight(lbs): 15 Blood Pressure(mmHg): 157/71 Body Mass Index(BMI): 49 Temperature(F): 98.5 Respiratory Rate(breaths/min): 20 Photos: [N/A:N/A] Right, Posterior Lower Leg Left, Medial Foot N/A Wound Location: Gradually Appeared Gradually Appeared N/A Wounding Event: Lymphedema Lymphedema N/A Primary  Etiology: Lymphedema, Arrhythmia, Congestive Lymphedema, Arrhythmia, Congestive N/A Comorbid History: Heart Failure, Coronary Artery Heart Failure, Coronary Artery Disease, Hypertension, Myocardial Disease, Hypertension, Myocardial Infarction, Gout, Osteoarthritis, Infarction, Gout, Osteoarthritis, Received Chemotherapy Received Chemotherapy 09/30/2020 02/25/2021 N/A Date Acquired: 7 1 N/A Weeks of Treatment: Open Healed - Epithelialized N/A Wound Status: 0.7x0.7x0.1 0x0x0 N/A Measurements L x W x D (cm) 0.385 0 N/A A (cm) : rea 0.038 0 N/A Volume (cm) : 86.40% 100.00% N/A % Reduction in Area: 93.30% 100.00% N/A % Reduction in Volume: Full Thickness Without Exposed Full Thickness Without Exposed N/A Classification: Support Structures Support Structures Large None Present N/A Exudate Amount: Serosanguineous N/A N/A Exudate Type: red, brown N/A N/A Exudate Color: Distinct, outline attached Distinct, outline attached N/A Wound Margin: Large (67-100%) None Present (0%) N/A Granulation Amount: Red, Pink N/A N/A Granulation Quality: Small (1-33%) None Present (0%) N/A Necrotic Amount: Fat Layer (Subcutaneous Tissue): Yes Fascia: No N/A Exposed Structures: Fascia: No Fat Layer (Subcutaneous Tissue): No Tendon: No Tendon: No Muscle: No Muscle: No Joint: No Joint: No Bone: No Bone: No Medium (34-66%) Large (67-100%) N/A Epithelialization: Compression Therapy N/A N/A Procedures Performed: Treatment Notes Wound #1 (Lower Leg) Wound Laterality: Right, Posterior Cleanser Peri-Wound Care Zinc Oxide Ointment 30g tube Discharge Instruction: Apply Zinc Oxide mixed with ketoconozole and TCA cream to irritated area posterior ankle Sween Lotion (Moisturizing lotion) Discharge Instruction: Apply moisturizing lotion to leg Topical Primary Dressing KerraCel Ag Gelling Fiber Dressing, 2x2 in (silver alginate) Discharge Instruction: Apply silver alginate to wound bed as  instructed Secondary Dressing Woven Gauze Sponge, Non-Sterile 4x4 in Discharge Instruction: Apply over primary dressing as directed. ABD Pad, 5x9 Discharge Instruction: Apply over primary dressing in skin fold posterior ankle Secured With Compression Wrap Unnaboot w/Calamine, 4x10 (in/yd) Discharge Instruction: Apply Unnaboot as directed. May also use Miliken CoFlex Calamine 2 layer compression system as alternative. Compression Stockings Add-Ons Electronic Signature(s) Signed: 03/10/2021 3:08:13 PM By: Kalman Shan DO Signed: 03/10/2021 5:51:46 PM By: Lisa Hurst RN, BSN Entered By: Kalman Shan on 03/10/2021 15:02:13 -------------------------------------------------------------------------------- [place] Details Patient Name: Date of Service: [name], [name] 03/10/2021 1:15 PM Medical Record Number: [id number] Patient Account Number: [id number] Date of Birth/Sex: Treating RN: March 27, 1941 (80 y.o. Lisa [name] Primary Care Lorimer Tiberio: Lisa [name] Other Clinician: Referring Cyan Moultrie: Treating Kaidance Pantoja/Extender: Lisa [name] in Treatment: 7 Multidisciplinary Care Plan reviewed with physician Active Inactive Venous Leg Ulcer Nursing Diagnoses: Knowledge deficit related to disease process and management Potential for venous Insuffiency (use before diagnosis confirmed) Goals: Patient will maintain optimal edema control Date Initiated: 01/20/2021 Target Resolution Date: 03/21/2021 Goal Status: Active Interventions: Assess peripheral edema status every visit. Compression as ordered  Treatment Activities: Therapeutic compression applied : 01/20/2021 Notes: Wound/Skin Impairment Nursing Diagnoses: Impaired tissue integrity Knowledge deficit related to ulceration/compromised skin integrity Goals: Patient/caregiver will verbalize understanding of skin care regimen Date Initiated: 01/20/2021 Target Resolution  Date: 03/21/2021 Goal Status: Active Ulcer/skin breakdown will have a volume reduction of 30% by week 4 Date Initiated: 01/20/2021 Date Inactivated: 02/21/2021 Target Resolution Date: 02/17/2021 Goal Status: Met Ulcer/skin breakdown will have a volume reduction of 50% by week 8 Date Initiated: 02/21/2021 Target Resolution Date: 03/21/2021 Goal Status: Active Interventions: Assess patient/caregiver ability to obtain necessary supplies Assess patient/caregiver ability to perform ulcer/skin care regimen upon admission and as needed Assess ulceration(s) every visit Provide education on ulcer and skin care Treatment Activities: Skin care regimen initiated : 01/20/2021 Topical wound management initiated : 01/20/2021 Notes: Electronic Signature(s) Signed: 03/10/2021 5:51:46 PM By: Lisa Hurst RN, BSN Entered By: Lisa [name] on 03/10/2021 14:19:36 -------------------------------------------------------------------------------- Pain Assessment Details Patient Name: Date of Service: [name], [name] 03/10/2021 1:15 PM Medical Record Number: [id number] Patient Account Number: [id number] Date of Birth/Sex: Treating RN: April 26, 1941 (80 y.o. Lisa [name] Primary Care Broady Lafoy: Lisa [name] Other Clinician: Referring Montel Vanderhoof: Treating Carizma Dunsworth/Extender: Lisa [name] in Treatment: 7 Active Problems Location of Pain Severity and Description of Pain Patient Has Paino No Site Locations Pain Management and Medication Current Pain Management: Electronic Signature(s) Signed: 03/10/2021 5:51:46 PM By: Lisa Hurst RN, BSN Entered By: Lisa [name] on 03/10/2021 14:18:31 -------------------------------------------------------------------------------- Patient/Caregiver Education Details Patient Name: Date of Service: [name], MA [name] 8/1/2022andnbsp1:15 PM Medical Record Number: [id number] Patient Account Number: [id number] Date of  Birth/Gender: Treating RN: 27-Jul-1941 (80 y.o. Lisa [name] Primary Care Physician: Lisa [name] Other Clinician: Referring Physician: Treating Physician/Extender: Lisa [name] in Treatment: 7 Education Assessment Education Provided To: Patient Education Topics Provided Wound/Skin Impairment: Methods: Explain/Verbal Responses: State content correctly Electronic Signature(s) Signed: 03/10/2021 5:51:46 PM By: Lisa Hurst RN, BSN Entered By: Lisa [name] on 03/10/2021 14:19:46 -------------------------------------------------------------------------------- Wound Assessment Details Patient Name: Date of Service: [name], [name] 03/10/2021 1:15 PM Medical Record Number: [id number] Patient Account Number: [id number] Date of Birth/Sex: Treating RN: 1941-05-16 (80 y.o. Lisa [name] Primary Care Ladonya Jerkins: Lisa [name] Other Clinician: Referring Simran Bomkamp: Treating Glennys Schorsch/Extender: Lisa [name] in Treatment: 7 Wound Status Wound Number: 1 Primary Lymphedema Etiology: Wound Location: Right, Posterior Lower Leg Wound Open Wounding Event: Gradually Appeared Status: Date Acquired: 09/30/2020 Comorbid Lymphedema, Arrhythmia, Congestive Heart Failure, Coronary Weeks Of Treatment: 7 History: Artery Disease, Hypertension, Myocardial Infarction, Gout, Clustered Wound: No Osteoarthritis, Received Chemotherapy Photos Wound Measurements Length: (cm) 0.7 Width: (cm) 0.7 Depth: (cm) 0.1 Area: (cm) 0.385 Volume: (cm) 0.038 % Reduction in Area: 86.4% % Reduction in Volume: 93.3% Epithelialization: Medium (34-66%) Tunneling: No Undermining: No Wound Description Classification: Full Thickness Without Exposed Support Structures Wound Margin: Distinct, outline attached Exudate Amount: Large Exudate Type: Serosanguineous Exudate Color: red, brown Foul Odor After Cleansing: No Slough/Fibrino Yes Wound  Bed Granulation Amount: Large (67-100%) Exposed Structure Granulation Quality: Red, Pink Fascia Exposed: No Necrotic Amount: Small (1-33%) Fat Layer (Subcutaneous Tissue) Exposed: Yes Necrotic Quality: Adherent Slough Tendon Exposed: No Muscle Exposed: No Joint Exposed: No Bone Exposed: No Treatment Notes Wound #1 (Lower Leg) Wound Laterality: Right, Posterior Cleanser Peri-Wound Care Zinc Oxide Ointment 30g tube Discharge Instruction: Apply Zinc Oxide mixed with ketoconozole and TCA cream to irritated area posterior ankle Sween Lotion (Moisturizing lotion) Discharge Instruction: Apply moisturizing lotion to leg Topical  Primary Dressing KerraCel Ag Gelling Fiber Dressing, 2x2 in (silver alginate) Discharge Instruction: Apply silver alginate to wound bed as instructed Secondary Dressing Woven Gauze Sponge, Non-Sterile 4x4 in Discharge Instruction: Apply over primary dressing as directed. ABD Pad, 5x9 Discharge Instruction: Apply over primary dressing in skin fold posterior ankle Secured With Compression Wrap Unnaboot w/Calamine, 4x10 (in/yd) Discharge Instruction: Apply Unnaboot as directed. May also use Miliken CoFlex Calamine 2 layer compression system as alternative. Compression Stockings Add-Ons Electronic Signature(s) Signed: 03/10/2021 5:51:46 PM By: Lisa Hurst RN, BSN Entered By: Lisa [name] on 03/10/2021 14:18:55 -------------------------------------------------------------------------------- Wound Assessment Details Patient Name: Date of Service: [name], [name] 03/10/2021 1:15 PM Medical Record Number: [id number] Patient Account Number: [id number] Date of Birth/Sex: Treating RN: 20-Sep-1940 (80 y.o. Lisa [name] Primary Care Hale Chalfin: Lisa [name] Other Clinician: Referring Wilkin Lippy: Treating Effie Wahlert/Extender: Lisa [name] in Treatment: 7 Wound Status Wound Number: 2 Primary Lymphedema Etiology: Wound  Location: Left, Medial Foot Wound Healed - Epithelialized Wounding Event: Gradually Appeared Status: Date Acquired: 02/25/2021 Comorbid Lymphedema, Arrhythmia, Congestive Heart Failure, Coronary Weeks Of Treatment: 1 History: Artery Disease, Hypertension, Myocardial Infarction, Gout, Clustered Wound: No Osteoarthritis, Received Chemotherapy Photos Wound Measurements Length: (cm) Width: (cm) Depth: (cm) Area: (cm) Volume: (cm) 0 % Reduction in Area: 100% 0 % Reduction in Volume: 100% 0 Epithelialization: Large (67-100%) 0 Tunneling: No 0 Undermining: No Wound Description Classification: Full Thickness Without Exposed Support Structures Wound Margin: Distinct, outline attached Exudate Amount: None Present Foul Odor After Cleansing: No Slough/Fibrino No Wound Bed Granulation Amount: None Present (0%) Exposed Structure Necrotic Amount: None Present (0%) Fascia Exposed: No Fat Layer (Subcutaneous Tissue) Exposed: No Tendon Exposed: No Muscle Exposed: No Joint Exposed: No Bone Exposed: No Electronic Signature(s) Signed: 03/10/2021 5:51:46 PM By: Lisa Hurst RN, BSN Entered By: Lisa [name] on 03/10/2021 14:19:16 -------------------------------------------------------------------------------- [place] Details Patient Name: Date of Service: [name], [name] 03/10/2021 1:15 PM Medical Record Number: [id number] Patient Account Number: [id number] Date of Birth/Sex: Treating RN: 16-Jun-1941 (80 y.o. Lisa [name] Primary Care Aurther Harlin: Lisa [name] Other Clinician: Referring Cortasia Screws: Treating Shawnta Schlegel/Extender: Lisa [name] in Treatment: 7 Vital Signs Time Taken: 14:18 Temperature (F): 98.5 Height (in): 62 Pulse (bpm): 74 Weight (lbs): 268 Respiratory Rate (breaths/min): 20 Body Mass Index (BMI): 49 Blood Pressure (mmHg): 157/71 Reference Range: 80 - 120 mg / dl Electronic Signature(s) Signed: 03/10/2021 5:51:46 PM By: Lisa Hurst RN, BSN Entered By: Lisa [name] on 03/10/2021 14:18:25

## 2021-03-12 ENCOUNTER — Telehealth: Payer: Self-pay

## 2021-03-12 ENCOUNTER — Encounter: Payer: Medicare Other | Admitting: Occupational Therapy

## 2021-03-12 NOTE — Telephone Encounter (Signed)
See Mychart message for resolution

## 2021-03-12 NOTE — Telephone Encounter (Signed)
Patient called in stating that she has covid and her pcp wants to put her on anti viral medication paxolvid and she wants her to stop amiodarone and lipitor and cut the carvedilol in 1/2 BID. She wants to make sure this is okay since these meds are for her heart. She states if this is a problem if she can get a call back right away

## 2021-03-12 NOTE — Telephone Encounter (Signed)
Alternatives would be molnupiravir (oral) or her provider could refer her for a mAB infusion

## 2021-03-12 NOTE — Telephone Encounter (Signed)
Left detailed message reiterating Pt should not use paxlovid.  May use alternatives:  molnupiravir or MAB infusion.  Will forward phone call to PCP.  Left detailed message requesting call back.

## 2021-03-12 NOTE — Telephone Encounter (Signed)
Called pt and left detailed message with information from pharmacy team (OK per DPR).  Advised to call back with any questions.

## 2021-03-12 NOTE — Telephone Encounter (Signed)
Due to the long half life of amiodarone, this will not be of benefit to stop. The use of paxlovid and amiodarone and contraindicated and this patient should NOT take paxlovid.

## 2021-03-14 ENCOUNTER — Encounter: Payer: Medicare Other | Admitting: Occupational Therapy

## 2021-03-17 ENCOUNTER — Other Ambulatory Visit: Payer: Self-pay

## 2021-03-17 ENCOUNTER — Encounter (HOSPITAL_BASED_OUTPATIENT_CLINIC_OR_DEPARTMENT_OTHER): Payer: Medicare Other | Admitting: Physician Assistant

## 2021-03-17 ENCOUNTER — Encounter: Payer: Medicare Other | Admitting: Occupational Therapy

## 2021-03-17 DIAGNOSIS — I890 Lymphedema, not elsewhere classified: Secondary | ICD-10-CM | POA: Diagnosis not present

## 2021-03-18 NOTE — Progress Notes (Signed)
Lisa [name], HARDBARGER ([id number]) Visit Report for 03/17/2021 Arrival Information Details Patient Name: Date of Service: [name], [name] 03/17/2021 1:30 PM Medical Record Number: [id number] Patient Account Number: [id number] Date of Birth/Sex: Treating RN: 1941/06/29 (80 y.o. Lisa [name] Primary Care Carlyle Mcelrath: [name] Other Clinician: Referring Kaedyn Belardo: Treating Cheyenne Schumm/Extender: Kateri Plummer in Treatment: 8 Visit Information History Since Last Visit Added or deleted any medications: No Patient Arrived: Ambulatory Any new allergies or adverse reactions: No Arrival Time: 13:20 Had a fall or experienced change in No Accompanied By: self activities of daily living that may affect Transfer Assistance: None risk of falls: Patient Identification Verified: Yes Signs or symptoms of abuse/neglect since last visito No Secondary Verification Process Completed: Yes Hospitalized since last visit: No Patient Requires Transmission-Based Precautions: No Implantable device outside of the clinic excluding No Patient Has Alerts: Yes cellular tissue based products placed in the center Patient Alerts: Patient on Blood Thinner since last visit: Defibrillator Has Dressing in Place as Prescribed: Yes ABI=[place] Bilaterally Has Compression in Place as Prescribed: Yes Pain Present Now: No Electronic Signature(s) Signed: 03/18/2021 5:39:27 PM By: Baruch Gouty RN, BSN Entered By: Baruch Gouty on 03/17/2021 13:20:54 -------------------------------------------------------------------------------- Compression Therapy Details Patient Name: Date of Service: [name], [name] 03/17/2021 1:30 PM Medical Record Number: [id number] Patient Account Number: [id number] Date of Birth/Sex: Treating RN: Aug 28, 1940 (80 y.o. Lisa [name] Primary Care Kayler Buckholtz: [name] Other Clinician: Referring Jakub Debold: Treating Tomesha Sargent/Extender: Kateri Plummer in Treatment: 8 Compression Therapy Performed for Wound Assessment: Wound #1 Right,Posterior Lower Leg Performed By: Clinician Baruch Gouty, RN Compression Type: Rolena Infante Electronic Signature(s) Signed: 03/18/2021 5:39:27 PM By: Baruch Gouty RN, BSN Entered By: Baruch Gouty on 03/17/2021 13:47:50 -------------------------------------------------------------------------------- Compression Therapy Details Patient Name: Date of Service: [name], [name] 03/17/2021 1:30 PM Medical Record Number: [id number] Patient Account Number: [id number] Date of Birth/Sex: Treating RN: 07-Jun-1941 (80 y.o. Lisa [name] Primary Care Maciel Kegg: [name] Other Clinician: Referring Gamal Todisco: Treating Skyah Hannon/Extender: Kateri Plummer in Treatment: 8 Compression Therapy Performed for Wound Assessment: NonWound Condition Lymphedema - Left Leg Performed By: Clinician Baruch Gouty, RN Compression Type: Rolena Infante Electronic Signature(s) Signed: 03/18/2021 5:39:27 PM By: Baruch Gouty RN, BSN Entered By: Baruch Gouty on 03/17/2021 13:48:10 -------------------------------------------------------------------------------- Encounter Discharge Information Details Patient Name: Date of Service: [name], [name] 03/17/2021 1:30 PM Medical Record Number: [id number] Patient Account Number: [id number] Date of Birth/Sex: Treating RN: [birth year] (80 y.o. Lisa [name] Primary Care Grenda Lora: [name] Other Clinician: Referring Miquel Stacks: Treating Avonna Iribe/Extender: Kateri Plummer in Treatment: 8 Encounter Discharge Information Items Discharge Condition: Stable Ambulatory Status: [place] Discharge Destination: Home Transportation: Private Auto Accompanied By: self Schedule Follow-up Appointment: Yes Clinical Summary of Care: Patient Declined Electronic Signature(s) Signed: 03/18/2021 5:39:27 PM  By: Baruch Gouty RN, BSN Entered By: Baruch Gouty on 03/17/2021 13:49:04 -------------------------------------------------------------------------------- Patient/Caregiver Education Details Patient Name: Date of Service: [name], MA [name] 8/8/2022andnbsp1:30 PM Medical Record Number: [id number] Patient Account Number: [id number] Date of Birth/Gender: Treating RN: 11-09-1940 (80 y.o. Lisa [name] Primary Care Physician: [name] Other Clinician: Referring Physician: Treating Physician/Extender: Kateri Plummer in Treatment: 8 Education Assessment Education Provided To: Patient Education Topics Provided Venous: Methods: Explain/Verbal Responses: Reinforcements needed, State content correctly Electronic Signature(s) Signed: 03/18/2021 5:39:27 PM By: Baruch Gouty RN, BSN Entered By: Baruch Gouty on 03/17/2021  13:48:51 -------------------------------------------------------------------------------- Wound Assessment Details Patient Name: Date of Service: [name], [name] 03/17/2021 1:30 PM Medical Record Number: [id number] Patient Account Number: [id number] Date of Birth/Sex: Treating RN: Jul 05, 1941 (80 y.o. Lisa [name] Primary Care Airel Magadan: [name] Other Clinician: Referring Ellagrace Yoshida: Treating Kallen Delatorre/Extender: Kateri Plummer in Treatment: 8 Wound Status Wound Number: 1 Primary Lymphedema Etiology: Wound Location: Right, Posterior Lower Leg Wound Open Wounding Event: Gradually Appeared Status: Date Acquired: 09/30/2020 Comorbid Lymphedema, Arrhythmia, Congestive Heart Failure, Coronary Weeks Of Treatment: 8 History: Artery Disease, Hypertension, Myocardial Infarction, Gout, Clustered Wound: No Osteoarthritis, Received Chemotherapy Wound Measurements Length: (cm) 0.7 Width: (cm) 0.7 Depth: (cm) 0.1 Area: (cm) 0.385 Volume: (cm) 0.038 % Reduction in Area: 86.4% %  Reduction in Volume: 93.3% Epithelialization: Medium (34-66%) Tunneling: No Undermining: No Wound Description Classification: Full Thickness Without Exposed Support Structures Wound Margin: Distinct, outline attached Exudate Amount: Medium Exudate Type: Serous Exudate Color: amber Foul Odor After Cleansing: No Slough/Fibrino Yes Wound Bed Granulation Amount: Large (67-100%) Exposed Structure Granulation Quality: Red, Pink Fascia Exposed: No Necrotic Amount: None Present (0%) Fat Layer (Subcutaneous Tissue) Exposed: Yes Tendon Exposed: No Muscle Exposed: No Joint Exposed: No Bone Exposed: No Treatment Notes Wound #1 (Lower Leg) Wound Laterality: Right, Posterior Cleanser Peri-Wound Care Zinc Oxide Ointment 30g tube Discharge Instruction: Apply Zinc Oxide mixed with ketoconozole and TCA cream to irritated area posterior ankle Sween Lotion (Moisturizing lotion) Discharge Instruction: Apply moisturizing lotion to leg Topical Primary Dressing KerraCel Ag Gelling Fiber Dressing, 2x2 in (silver alginate) Discharge Instruction: Apply silver alginate to wound bed as instructed Secondary Dressing Woven Gauze Sponge, Non-Sterile 4x4 in Discharge Instruction: Apply over primary dressing as directed. ABD Pad, 5x9 Discharge Instruction: Apply over primary dressing in skin fold posterior ankle Secured With Compression Wrap Unnaboot w/Calamine, 4x10 (in/yd) Discharge Instruction: Apply Unnaboot as directed. May also use Miliken CoFlex Calamine 2 layer compression system as alternative. Compression Stockings Add-Ons Electronic Signature(s) Signed: 03/18/2021 5:39:27 PM By: Baruch Gouty RN, BSN Entered By: Baruch Gouty on 03/17/2021 13:46:14 -------------------------------------------------------------------------------- [place] Details Patient Name: Date of Service: [name], [name] 03/17/2021 1:30 PM Medical Record Number: [id number] Patient Account Number:  [id number] Date of Birth/Sex: Treating RN: 1941-03-11 (80 y.o. Lisa [name] Primary Care Emre Stock: [name] Other Clinician: Referring Cristyn Crossno: Treating Madellyn Denio/Extender: Kateri Plummer in Treatment: 8 Vital Signs Time Taken: 13:20 Temperature (F): 98 Height (in): 62 Pulse (bpm): 62 Source: Stated Respiratory Rate (breaths/min): 18 Weight (lbs): 268 Blood Pressure (mmHg): 131/75 Source: Stated Reference Range: 80 - 120 mg / dl Body Mass Index (BMI): 49 Electronic Signature(s) Signed: 03/18/2021 5:39:27 PM By: Baruch Gouty RN, BSN Entered By: Baruch Gouty on 03/17/2021 13:22:01

## 2021-03-18 NOTE — Progress Notes (Signed)
SHANTA, CALVEY ([id number]) Visit Report for 03/17/2021 SuperBill Details Patient Name: Date of Service: [name], [name] 03/17/2021 Medical Record Number: [id number] Patient Account Number: [id number] Date of Birth/Sex: Treating RN: 01-18-1941 (80 y.o. 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

## 2021-03-19 ENCOUNTER — Encounter: Payer: Medicare Other | Admitting: Occupational Therapy

## 2021-03-21 ENCOUNTER — Encounter: Payer: Medicare Other | Admitting: Occupational Therapy

## 2021-03-24 ENCOUNTER — Encounter: Payer: Medicare Other | Admitting: Occupational Therapy

## 2021-03-25 ENCOUNTER — Ambulatory Visit (INDEPENDENT_AMBULATORY_CARE_PROVIDER_SITE_OTHER): Payer: Medicare Other | Admitting: Internal Medicine

## 2021-03-25 ENCOUNTER — Other Ambulatory Visit: Payer: Self-pay

## 2021-03-25 ENCOUNTER — Encounter (HOSPITAL_BASED_OUTPATIENT_CLINIC_OR_DEPARTMENT_OTHER): Payer: Medicare Other | Admitting: Internal Medicine

## 2021-03-25 VITALS — BP 132/56 | HR 67 | Ht 62.0 in | Wt 260.2 lb

## 2021-03-25 DIAGNOSIS — Z95810 Presence of automatic (implantable) cardiac defibrillator: Secondary | ICD-10-CM | POA: Diagnosis not present

## 2021-03-25 DIAGNOSIS — I4901 Ventricular fibrillation: Secondary | ICD-10-CM | POA: Diagnosis not present

## 2021-03-25 DIAGNOSIS — I5022 Chronic systolic (congestive) heart failure: Secondary | ICD-10-CM

## 2021-03-25 NOTE — Progress Notes (Signed)
Lisa [name], Lisa [name] ([id number]) Visit Report for 03/25/2021 Arrival Information Details Patient Name: Date of Service: [name], [name] 03/25/2021 2:00 PM Medical Record Number: [id number] Patient Account Number: [id number] Date of Birth/Sex: Treating RN: 06-18-1941 (80 y.o. Lisa [name] Primary Care Maryhelen Lindler: [name] Other Clinician: Referring Briah Nary: Treating Harun Brumley/Extender: Heidi Dach in Treatment: 9 Visit Information History Since Last Visit Added or deleted any medications: No Patient Arrived: Lisa [name] Any new allergies or adverse reactions: No Arrival Time: 13:56 Had a fall or experienced change in No Transfer Assistance: None activities of daily living that may affect Patient Identification Verified: Yes risk of falls: Secondary Verification Process Completed: Yes Signs or symptoms of abuse/neglect since last visito No Patient Requires Transmission-Based Precautions: No Hospitalized since last visit: No Patient Has Alerts: Yes Implantable device outside of the clinic excluding No Patient Alerts: Patient on Blood Thinner cellular tissue based products placed in the center Defibrillator since last visit: ABI=[place] Bilaterally Has Dressing in Place as Prescribed: Yes Has Compression in Place as Prescribed: Yes Pain Present Now: No Electronic Signature(s) Signed: 03/25/2021 5:08:22 PM By: Lorrin Jackson Entered By: Lorrin Jackson on 03/25/2021 13:56:22 -------------------------------------------------------------------------------- Compression Therapy Details Patient Name: Date of Service: [name], [name] 03/25/2021 2:00 PM Medical Record Number: [id number] Patient Account Number: [id number] Date of Birth/Sex: Treating RN: [birth year] (80 y.o. Lisa [name] Primary Care [name]: [name] Other Clinician: Referring Zilpha Mcandrew: Treating Lisa [name]/Extender: Heidi Dach in Treatment:  9 Compression Therapy Performed for Wound Assessment: Wound #1 Right,Posterior Lower Leg Performed By: Clinician Lorrin Jackson, RN Compression Type: Rolena Infante Post Procedure Diagnosis Same as Pre-procedure Electronic Signature(s) Signed: 03/25/2021 5:08:22 PM By: Lorrin Jackson Entered By: Lorrin Jackson on 03/25/2021 14:25:57 -------------------------------------------------------------------------------- Compression Therapy Details Patient Name: Date of Service: [name], [name] 03/25/2021 2:00 PM Medical Record Number: [id number] Patient Account Number: [id number] Date of Birth/Sex: Treating RN: [birth year] (80 y.o. Lisa [name] Primary Care Bryona [name]: [name] Other Clinician: Referring Myldred Raju: Treating Lisa [name]/Extender: Heidi Dach in Treatment: 9 Compression Therapy Performed for Wound Assessment: NonWound Condition Lymphedema - Left Leg Performed By: Clinician Lorrin Jackson, RN Compression Type: Rolena Infante Post Procedure Diagnosis Same as Pre-procedure Electronic Signature(s) Signed: 03/25/2021 5:08:22 PM By: Lorrin Jackson Entered By: Lorrin Jackson on 03/25/2021 14:26:12 -------------------------------------------------------------------------------- Encounter Discharge Information Details Patient Name: Date of Service: [name], [name] 03/25/2021 2:00 PM Medical Record Number: [id number] Patient Account Number: [id number] Date of Birth/Sex: Treating RN: 1940/10/01 (80 y.o. Lisa [name] Primary Care Lisa [name]: [name] Other Clinician: Referring Demetrus Pavao: Treating Lisa [name]/Extender: Heidi Dach in Treatment: 9 Encounter Discharge Information Items Discharge Condition: Stable Ambulatory Status: [place] Discharge Destination: Home Transportation: Private Auto Schedule Follow-up Appointment: Yes Clinical Summary of Care: Provided on 03/25/2021 Form Type Recipient Paper Patient  Patient Electronic Signature(s) Signed: 03/25/2021 3:47:41 PM By: Lorrin Jackson Entered By: Lorrin Jackson on 03/25/2021 15:47:41 -------------------------------------------------------------------------------- Lower Extremity Assessment Details Patient Name: Date of Service: [name], [name] 03/25/2021 2:00 PM Medical Record Number: [id number] Patient Account Number: [id number] Date of Birth/Sex: Treating RN: Oct 01, 1940 (80 y.o. Lisa [name] Primary Care Lisa [name]: [name] Other Clinician: Referring Lisa [name]: Treating Lisa [name]/Extender: Heidi Dach in Treatment: 9 Edema Assessment Assessed: [name]: Yes] [name]: Yes] Edema: [Left: Yes] [Right: Yes] Calf Left: Right: Point of Measurement: 30 cm From Medial Instep 62 cm 67 cm Ankle Left: Right: Point of Measurement:  6 cm From Medial Instep 32 cm 34 cm Vascular Assessment Pulses: Dorsalis Pedis Palpable: [Left:Yes] [Right:Yes] Electronic Signature(s) Signed: 03/25/2021 5:08:22 PM By: Lorrin Jackson Entered By: Lorrin Jackson on 03/25/2021 13:58:47 -------------------------------------------------------------------------------- Multi Wound Chart Details Patient Name: Date of Service: [name], [name] 03/25/2021 2:00 PM Medical Record Number: [id number] Patient Account Number: [id number] Date of Birth/Sex: Treating RN: 1940-11-21 (80 y.o. Lisa [name], [name] Primary Care Allean Montfort: [name] Other Clinician: Referring Rashada Klontz: Treating Delia Sitar/Extender: Heidi Dach in Treatment: 9 Vital Signs Height(in): 25 Pulse(bpm): 51 Weight(lbs): 25 Blood Pressure(mmHg): 157/74 Body Mass Index(BMI): 49 Temperature(F): 98.6 Respiratory Rate(breaths/min): 18 Photos: [N/A:N/A] Right, Posterior Lower Leg N/A N/A Wound Location: Gradually Appeared N/A N/A Wounding Event: Lymphedema N/A N/A Primary Etiology: Lymphedema, Arrhythmia, Congestive N/A  N/A Comorbid History: Heart Failure, Coronary Artery Disease, Hypertension, Myocardial Infarction, Gout, Osteoarthritis, Received Chemotherapy 09/30/2020 N/A N/A Date Acquired: 9 N/A N/A Weeks of Treatment: Open N/A N/A Wound Status: 0.8x1x0.1 N/A N/A Measurements L x W x D (cm) 0.628 N/A N/A A (cm) : rea 0.063 N/A N/A Volume (cm) : 77.80% N/A N/A % Reduction in Area: 88.80% N/A N/A % Reduction in Volume: Full Thickness Without Exposed N/A N/A Classification: Support Structures Medium N/A N/A Exudate Amount: Serous N/A N/A Exudate Type: amber N/A N/A Exudate Color: Distinct, outline attached N/A N/A Wound Margin: Large (67-100%) N/A N/A Granulation Amount: Red, Pink N/A N/A Granulation Quality: None Present (0%) N/A N/A Necrotic Amount: Fat Layer (Subcutaneous Tissue): Yes N/A N/A Exposed Structures: Fascia: No Tendon: No Muscle: No Joint: No Bone: No Medium (34-66%) N/A N/A Epithelialization: Compression Therapy N/A N/A Procedures Performed: Treatment Notes Electronic Signature(s) Signed: 03/25/2021 4:53:39 PM By: [name] MD Signed: 03/25/2021 5:19:53 PM By: [name] Entered By: [name] on 03/25/2021 14:47:05 -------------------------------------------------------------------------------- Multi-Disciplinary Care Plan Details Patient Name: Date of Service: [name], [name] 03/25/2021 2:00 PM Medical Record Number: [id number] Patient Account Number: [id number] Date of Birth/Sex: Treating RN: Jul 24, 1941 (80 y.o. Lisa [name] Primary Care Anwitha Mapes: [name] Other Clinician: Referring Lakeyta Vandenheuvel: Treating Kamariya Blevens/Extender: Heidi Dach in Treatment: 9 Multidisciplinary Care Plan reviewed with physician Active Inactive Venous Leg Ulcer Nursing Diagnoses: Knowledge deficit related to disease process and management Potential for venous Insuffiency (use before diagnosis  confirmed) Goals: Patient will maintain optimal edema control Date Initiated: 01/20/2021 Target Resolution Date: 04/29/2021 Goal Status: Active Interventions: Assess peripheral edema status every visit. Compression as ordered Treatment Activities: Therapeutic compression applied : 01/20/2021 Notes: 03/25/21: Edema improved but ongoing issue. Wound/Skin Impairment Nursing Diagnoses: Impaired tissue integrity Knowledge deficit related to ulceration/compromised skin integrity Goals: Patient/caregiver will verbalize understanding of skin care regimen Date Initiated: 01/20/2021 Date Inactivated: 03/25/2021 Target Resolution Date: 03/21/2021 Goal Status: Met Ulcer/skin breakdown will have a volume reduction of 30% by week 4 Date Initiated: 01/20/2021 Date Inactivated: 02/21/2021 Target Resolution Date: 02/17/2021 Goal Status: Met Ulcer/skin breakdown will have a volume reduction of 50% by week 8 Date Initiated: 02/21/2021 Date Inactivated: 03/25/2021 Target Resolution Date: 03/21/2021 Goal Status: Met Ulcer/skin breakdown will have a volume reduction of 80% by week 12 Date Initiated: 03/25/2021 Target Resolution Date: 04/22/2021 Goal Status: Active Interventions: Assess patient/caregiver ability to obtain necessary supplies Assess patient/caregiver ability to perform ulcer/skin care regimen upon admission and as needed Assess ulceration(s) every visit Provide education on ulcer and skin care Treatment Activities: Skin care regimen initiated : 01/20/2021 Topical wound management initiated : 01/20/2021 Notes: Electronic Signature(s) Signed: 03/25/2021 5:08:22 PM By:  Lorrin Jackson Entered By: Lorrin Jackson on 03/25/2021 14:01:09 -------------------------------------------------------------------------------- Pain Assessment Details Patient Name: Date of Service: [name], [name] 03/25/2021 2:00 PM Medical Record Number: [id number] Patient Account Number: [id number] Date of  Birth/Sex: Treating RN: Nov 10, 1940 (80 y.o. Lisa [name] Primary Care Carle Dargan: [name] Other Clinician: Referring Myndi Wamble: Treating Elior Robinette/Extender: Heidi Dach in Treatment: 9 Active Problems Location of Pain Severity and Description of Pain Patient Has Paino No Site Locations Pain Management and Medication Current Pain Management: Electronic Signature(s) Signed: 03/25/2021 5:08:22 PM By: Lorrin Jackson Entered By: Lorrin Jackson on 03/25/2021 13:56:55 -------------------------------------------------------------------------------- Patient/Caregiver Education Details Patient Name: Date of Service: [name], MA [name] 8/16/2022andnbsp2:00 PM Medical Record Number: [id number] Patient Account Number: [id number] Date of Birth/Gender: Treating RN: Sep 07, 1940 (80 y.o. Lisa [name] Primary Care Physician: [name] Other Clinician: Referring Physician: Treating Physician/Extender: Heidi Dach in Treatment: 9 Education Assessment Education Provided To: Patient Education Topics Provided Venous: Methods: Explain/Verbal, Printed Responses: State content correctly Wound/Skin Impairment: Methods: Explain/Verbal, Printed Responses: State content correctly Electronic Signature(s) Signed: 03/25/2021 5:08:22 PM By: Lorrin Jackson Entered By: Lorrin Jackson on 03/25/2021 14:01:55 -------------------------------------------------------------------------------- Wound Assessment Details Patient Name: Date of Service: [name], [name] 03/25/2021 2:00 PM Medical Record Number: [id number] Patient Account Number: [id number] Date of Birth/Sex: Treating RN: 09-18-1940 (80 y.o. Lisa [name], Meta.Reding Primary Care Sammy Douthitt: [name] Other Clinician: Referring Modelle Vollmer: Treating Laymond Postle/Extender: Heidi Dach in Treatment: 9 Wound Status Wound Number: 1 Primary  Lymphedema Etiology: Wound Location: Right, Posterior Lower Leg Wound Open Wounding Event: Gradually Appeared Status: Date Acquired: 09/30/2020 Comorbid Lymphedema, Arrhythmia, Congestive Heart Failure, Coronary Weeks Of Treatment: 9 History: Artery Disease, Hypertension, Myocardial Infarction, Gout, Clustered Wound: No Osteoarthritis, Received Chemotherapy Photos Wound Measurements Length: (cm) 0.8 Width: (cm) 1 Depth: (cm) 0.1 Area: (cm) 0.628 Volume: (cm) 0.063 % Reduction in Area: 77.8% % Reduction in Volume: 88.8% Epithelialization: Medium (34-66%) Tunneling: No Undermining: No Wound Description Classification: Full Thickness Without Exposed Support Structures Wound Margin: Distinct, outline attached Exudate Amount: Medium Exudate Type: Serous Exudate Color: amber Foul Odor After Cleansing: No Slough/Fibrino No Wound Bed Granulation Amount: Large (67-100%) Exposed Structure Granulation Quality: Red, Pink Fascia Exposed: No Necrotic Amount: None Present (0%) Fat Layer (Subcutaneous Tissue) Exposed: Yes Tendon Exposed: No Muscle Exposed: No Joint Exposed: No Bone Exposed: No Treatment Notes Wound #1 (Lower Leg) Wound Laterality: Right, Posterior Cleanser Soap and Water Discharge Instruction: May shower and wash wound with dial antibacterial soap and water prior to dressing change. Wound Cleanser Discharge Instruction: Cleanse the wound with wound cleanser prior to applying a clean dressing using gauze sponges, not tissue or cotton balls. Peri-Wound Care Triamcinolone 15 (g) Discharge Instruction: Use triamcinolone 15 (g) as directed Zinc Oxide Ointment 30g tube Discharge Instruction: Apply Zinc Oxide mixed with ketoconozole and TCA cream to irritated area posterior ankle Topical Primary Dressing KerraCel Ag Gelling Fiber Dressing, 2x2 in (silver alginate) Discharge Instruction: Apply silver alginate to wound bed as instructed Secondary Dressing Woven  Gauze Sponge, Non-Sterile 4x4 in Discharge Instruction: Apply over primary dressing as directed. ABD Pad, 5x9 Discharge Instruction: Apply over primary dressing in skin fold posterior ankle Secured With Compression Wrap Unnaboot w/Calamine, 4x10 (in/yd) Discharge Instruction: Apply Unnaboot as directed. May also use Miliken CoFlex [place] 2 layer compression system as alternative. Compression Stockings Add-Ons Electronic Signature(s) Signed: 03/25/2021 5:08:22 PM By: Lorrin Jackson Signed: 03/25/2021 5:19:53 PM By: [name] Entered By: Onnie Boer  Jodi on 03/25/2021 13:59:08 -------------------------------------------------------------------------------- Vitals Details Patient Name: Date of Service: [name], [name] 03/25/2021 2:00 PM Medical Record Number: [id number] Patient Account Number: [id number] Date of Birth/Sex: Treating RN: 1940-12-31 (80 y.o. Lisa [name] Primary Care Presley Gora: [name] Other Clinician: Referring Gertie Broerman: Treating Bowden Boody/Extender: Heidi Dach in Treatment: 9 Vital Signs Time Taken: 13:56 Temperature (F): 98.6 Height (in): 62 Pulse (bpm): 66 Weight (lbs): 268 Respiratory Rate (breaths/min): 18 Body Mass Index (BMI): 49 Blood Pressure (mmHg): 157/74 Reference Range: 80 - 120 mg / dl Electronic Signature(s) Signed: 03/25/2021 5:08:22 PM By: Lorrin Jackson Entered By: Lorrin Jackson on 03/25/2021 02:54:27

## 2021-03-25 NOTE — Patient Instructions (Signed)
Medication Instructions:  Your physician recommends that you continue on your current medications as directed. Please refer to the Current Medication list given to you today.  Labwork: None ordered.  Testing/Procedures: None ordered.  Follow-Up: Your physician wants you to follow-up in: one year with Cristopher [name], MD or one of the following Advanced Practice Providers on your designated Care Team:   Tommye Standard, [place] Legrand Como "Jonni Sanger" Chalmers Cater, [place]  Remote monitoring is used to monitor your ICD from home. This monitoring reduces the number of office visits required to check your device to one time per year. It allows [place] to keep an eye on the functioning of your device to ensure it is working properly. You are scheduled for a device check from home on 04/07/2021. You may send your transmission at any time that day. If you have a wireless device, the transmission will be sent automatically. After your physician reviews your transmission, you will receive a postcard with your next transmission date.  Any Other Special Instructions Will Be Listed Below (If Applicable).  If you need a refill on your cardiac medications before your next appointment, please call your pharmacy.

## 2021-03-25 NOTE — Progress Notes (Signed)
Lisa, [name] ([id number]) Visit Report for 03/25/2021 HPI Details Patient Name: Date of Service: [name], [name] 03/25/2021 2:00 PM Medical Record Number: [id number] Patient Account Number: [id number] Date of Birth/Sex: Treating RN: 12-Jun-1941 (80 y.o. Lisa [name] Primary Care Provider: [name] Other Clinician: Referring Provider: Treating Provider/Extender: Heidi Dach in Treatment: 9 History of Present Illness HPI Description: ADMISSION 01/20/2021 This is a 80 year old woman who tells me that she has had problems with lymphedema since the 1970s. She does not have, or has not been given, a clear explanation for this. She has been followed for many years at various lymphedema clinics including in [place] and then in [place] and now sees Stephani Police at [place]. Developed a wound on the right posterior calf about a month ago. When that happened she is no longer able to have lymphedema therapy on this leg. She has been seeing her primary doctor who dressed this with DuoDERM and more recently with an Haematologist. The wounds were also debrided on the posterior The patient has 80 year old compression pumps but I am not sure of the frequency of their use she also has juxta lites but again I am not sure how frequently unreliably the patient is using them. She has open wounds on the right posterior and weeping de-epithelialized area predominantly on the left anterior and left anterior lateral. 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 tach and has a cardioverter defibrillator, gout and obstructive sleep apnea. She also has had a melanoma on the right leg and there is an extensive scar in the middle of lymphedema on the right anterior lower leg ABIs in our clinic were  noncompressible bilaterally 6/20; patient's wound on the right posterior calf is better. She brought in her compression pumps today and says that he still do not fit her leg. She also has external compression stockings at home but she did not bring those in today. We have her in 4-layer compression she is actually doing quite well much less swelling in the right leg She has old melanoma surgery on her right leg but she is quite adamant that she did not have any radiation of this was back in the 1970s 6/29; right posterior calf wound. This appears somewhat dry but looks as though it is attempting to epithelialize. We have better edema control. We have somebody coming out to go over her compression pumps with her on 7/12. 7/15; patient presents because she is having increased irritation to her ankles. She has reported more weeping than usual to her lower extremities. She would like to switch from the Coflex used at last clinic visit to a 4-layer compression wrap. She also states she does not want to use Hydrofera Blue. Her compression pumps are coming in on Wednesday. 7/19; I have not seen this patient in quite a while. At that point we are dealing with an area on the right posterior calf in the setting of severe bilateral lymphedema. I do not remember they are actually being a problem on the left leg. However Dr. Heber Godfrey reports significant erythema and weeping on the left lower leg last week. We initially were using Coflex and change this to 4-layer compression last week although this is not really helped. She comes in today with a lot of discomfort around the left ankle and marked weeping edema. Complicating  things she is traveling to [facility] leaving on Sunday. She says that some members of her family or physicians and presumably they might be able to help with an Ace wrap beyond that I do not know what were going to do here. She has made arrangements for a visitation for her compression  pumps this Thursday. 8/1; patient presents for 2-week follow-up. She has been using Unna boots with silver alginate underneath. She reports improvement in her wounds. She overall reports feeling well. She has no issues or complaints today. She denies signs of infection. 8/16; patient comes back in follow-up. Since last time I saw her things are actually going quite a bit better. She has a weeping area on the right lower ankle pantaloon deformity posteriorly and probably an area just above the ankle anteriorly on the left as well. She has compression pumps which came last week however she was ill with COVID and did not get started on them. She is familiar with the lymphedema clinic in [place] and I think we are going to have to refer her back there. Electronic Signature(s) Signed: 03/25/2021 4:53:39 PM By: [name] MD Entered By: [name] on 03/25/2021 14:51:47 -------------------------------------------------------------------------------- Physical Exam Details Patient Name: Date of Service: [name], [name] 03/25/2021 2:00 PM Medical Record Number: [id number] Patient Account Number: [id number] Date of Birth/Sex: Treating RN: 11/22/1940 (80 y.o. Lisa [name] Primary Care Provider: [name] Other Clinician: Referring Provider: Treating Provider/Extender: Heidi Dach in Treatment: 9 Constitutional Patient is hypertensive.. Pulse regular and within target range for patient.[name] Respirations regular, non-labored and within target range.. Temperature is normal and within the target range for the patient.[name] Appears in no distress. Cardiovascular Pedal pedal pulses are palpable. Notes Wound exam; right lower extremity posterior pantaloon deformity still has some weeping fluid also anteriorly on the left. In general he had swelling in both legs looks better. There is no evidence of infection however there is skin changes related to chronic  lymphedema with scattered nodules hyperkeratotic skin etc. Electronic Signature(s) Signed: 03/25/2021 4:53:39 PM By: [name] MD Entered By: [name] on 03/25/2021 14:52:51 -------------------------------------------------------------------------------- Physician Orders Details Patient Name: Date of Service: [name], [name] 03/25/2021 2:00 PM Medical Record Number: [id number] Patient Account Number: [id number] Date of Birth/Sex: Treating RN: [birth year] (80 y.o. [name] Primary Care Provider: [name] Other Clinician: Referring Provider: Treating Provider/Extender: Heidi Dach in Treatment: 9 Verbal / Phone Orders: No Diagnosis Coding Follow-up Appointments ppointment in 1 week. - Dr. Dellia Nims Return A Bathing/ Shower/ Hygiene May shower with protection but do not get wound dressing(s) wet. Edema Control - Lymphedema / SCD / Other Bilateral Lower Extremities Lymphedema Pumps. Use Lymphedema pumps on leg(s) 2-3 times a day for 45-60 minutes. If wearing any wraps or hose, do not remove them. Continue exercising as instructed. - use over compression wraps Elevate legs to the level of the heart or above for 30 minutes daily and/or when sitting, a frequency of: - throughout the day Avoid standing for long periods of time. Exercise regularly Non Wound Condition Left Lower Extremity pply the following to affected area as directed: - Apply silver alginate to any weeping areas on left leg, cover with ABDs and [employer]. Use A TCA/Zinc Cream and ABD pad in fold of posterior ankles. Wound Treatment Wound #1 - Lower Leg Wound Laterality: Right, Posterior Cleanser: Soap and Water 1 x Per Week/15 Days Discharge Instructions: May shower  and wash wound with dial antibacterial soap and water prior to dressing change. Cleanser: Wound Cleanser 1 x Per Week/15 Days Discharge Instructions: Cleanse the wound with wound cleanser prior to  applying a clean dressing using gauze sponges, not tissue or cotton balls. Peri-Wound Care: Triamcinolone 15 (g) 1 x Per Week/15 Days Discharge Instructions: Use triamcinolone 15 (g) as directed Peri-Wound Care: Zinc Oxide Ointment 30g tube 1 x Per Week/15 Days Discharge Instructions: Apply Zinc Oxide mixed with ketoconozole and TCA cream to irritated area posterior ankle Prim Dressing: KerraCel Ag Gelling Fiber Dressing, 2x2 in (silver alginate) 1 x Per Week/15 Days ary Discharge Instructions: Apply silver alginate to wound bed as instructed Secondary Dressing: Woven Gauze Sponge, Non-Sterile 4x4 in 1 x Per Week/15 Days Discharge Instructions: Apply over primary dressing as directed. Secondary Dressing: ABD Pad, 5x9 1 x Per Week/15 Days Discharge Instructions: Apply over primary dressing in skin fold posterior ankle Compression Wrap: Unnaboot w/Calamine, 4x10 (in/yd) 1 x Per Week/15 Days Discharge Instructions: Apply Unnaboot as directed. May also use Miliken CoFlex Calamine 2 layer compression system as alternative. Electronic Signature(s) Signed: 03/25/2021 4:53:39 PM By: [name] MD Signed: 03/25/2021 5:08:22 PM By: Lorrin Jackson Previous Signature: 03/25/2021 2:21:30 PM Version By: Lorrin Jackson Entered By: Lorrin Jackson on 03/25/2021 14:30:48 -------------------------------------------------------------------------------- Problem List Details Patient Name: Date of Service: [name], [name] 03/25/2021 2:00 PM Medical Record Number: [id number] Patient Account Number: [id number] Date of Birth/Sex: Treating RN: [birth year] (80 y.o. [name], [name] Primary Care Provider: [name] Other Clinician: Referring Provider: Treating Provider/Extender: Heidi Dach in Treatment: 9 Active Problems ICD-10 Encounter Code Description Active Date MDM Diagnosis I89.0 Lymphedema, not elsewhere classified 01/20/2021 No Yes L97.211 Non-pressure  chronic ulcer of right calf limited to breakdown of skin 01/20/2021 No Yes L97.828 Non-pressure chronic ulcer of other part of left lower leg with other specified 01/20/2021 No Yes severity L03.116 Cellulitis of left lower limb 02/25/2021 No Yes Inactive Problems Resolved Problems Electronic Signature(s) Signed: 03/25/2021 4:53:39 PM By: [name] MD Entered By: [name] on 03/25/2021 14:46:48 -------------------------------------------------------------------------------- Progress Note Details Patient Name: Date of Service: [name], [name] 03/25/2021 2:00 PM Medical Record Number: [id number] Patient Account Number: [id number] Date of Birth/Sex: Treating RN: 23-Feb-1941 (80 y.o. Lisa [name] Primary Care Provider: [name] Other Clinician: Referring Provider: Treating Provider/Extender: Heidi Dach in Treatment: 9 Subjective History of Present Illness (HPI) ADMISSION 01/20/2021 This is a 80 year old woman who tells me that she has had problems with lymphedema since the 1970s. She does not have, or has not been given, a clear explanation for this. She has been followed for many years at various lymphedema clinics including in [place] and then in [place] and now sees Stephani Police at [place]. Developed a wound on the right posterior calf about a month ago. When that happened she is no longer able to have lymphedema therapy on this leg. She has been seeing her primary doctor who dressed this with DuoDERM and more recently with an Haematologist. The wounds were also debrided on the posterior The patient has 79 year old compression pumps but I am not sure of the frequency of their use she also has juxta lites but again I am not sure how frequently unreliably the patient is using them. She has open wounds on the right posterior and weeping de-epithelialized area predominantly on the left anterior and left anterior lateral. 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 tach and has a cardioverter defibrillator, gout and obstructive sleep apnea. She also has had a melanoma on the right leg and there is an extensive scar in the middle of lymphedema on the right anterior lower leg ABIs in our clinic were noncompressible bilaterally 6/20; patient's wound on the right posterior calf is better. She brought in her compression pumps today and says that he still do not fit her leg. She also has external compression stockings at home but she did not bring those in today. We have her in 4-layer compression she is actually doing quite well much less swelling in the right leg She has old melanoma surgery on her right leg but she is quite adamant that she did not have any radiation of this was back in the 1970s 6/29; right posterior calf wound. This appears somewhat dry but looks as though it is attempting to epithelialize. We have better edema control. We have somebody coming out to go over her compression pumps with her on 7/12. 7/15; patient presents because she is having increased irritation to her ankles. She has reported more weeping than usual to her lower extremities. She would like to switch from the Coflex used at last clinic visit to a 4-layer compression wrap. She also states she does not want to use Hydrofera Blue. Her compression pumps are coming in on Wednesday. 7/19; I have not seen this patient in quite a while. At that point we are dealing with an area on the right posterior calf in the setting of severe bilateral lymphedema. I do not remember they are actually being a problem on the left leg. However Dr. Heber [place] reports significant erythema and weeping on the left lower leg last week. We initially were using Coflex and change this to 4-layer compression last  week although this is not really helped. She comes in today with a lot of discomfort around the left ankle and marked weeping edema. Complicating things she is traveling to [place] [facility] leaving on Sunday. She says that some members of her family or physicians and presumably they might be able to help with an Ace wrap beyond that I do not know what were going to do here. She has made arrangements for a visitation for her compression pumps this Thursday. 8/1; patient presents for 2-week follow-up. She has been using Unna boots with silver alginate underneath. She reports improvement in her wounds. She overall reports feeling well. She has no issues or complaints today. She denies signs of infection. 8/16; patient comes back in follow-up. Since last time I saw her things are actually going quite a bit better. She has a weeping area on the right lower ankle pantaloon deformity posteriorly and probably an area just above the ankle anteriorly on the left as well. She has compression pumps which came last week however she was ill with COVID and did not get started on them. She is familiar with the lymphedema clinic in [place] and I think we are going to have to refer her back there. Objective Constitutional Patient is hypertensive.. Pulse regular and within target range for patient.[name] Respirations regular, non-labored and within target range.. Temperature is normal and within the target range for the patient.[name] Appears in no distress. Vitals Time Taken: 1:56 PM, Height: 62 in, Weight: 268 lbs, BMI: 49, Temperature: 98.6 F, Pulse: 66 bpm, Respiratory Rate: 18 breaths/min, Blood Pressure:  157/74 mmHg. Cardiovascular Pedal pedal pulses are palpable. General Notes: Wound exam; right lower extremity posterior pantaloon deformity still has some weeping fluid also anteriorly on the left. In general he had swelling in both legs looks better. There is no evidence of infection however there is skin changes  related to chronic lymphedema with scattered nodules hyperkeratotic skin etc. Integumentary (Hair, Skin) Wound #1 status is Open. Original cause of wound was Gradually Appeared. The date acquired was: 09/30/2020. The wound has been in treatment 9 weeks. The wound is located on the Right,Posterior Lower Leg. The wound measures 0.8cm length x 1cm width x 0.1cm depth; 0.628cm^2 area and 0.063cm^3 volume. There is Fat Layer (Subcutaneous Tissue) exposed. There is no tunneling or undermining noted. There is a medium amount of serous drainage noted. The wound margin is distinct with the outline attached to the wound base. There is large (67-100%) red, pink granulation within the wound bed. There is no necrotic tissue within the wound bed. 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 There was a Haematologist Compression Therapy Procedure by Lorrin Jackson, RN. Post procedure Diagnosis Wound #1: Same as Pre-Procedure There was a Haematologist Compression Therapy Procedure by Lorrin Jackson, RN. Post procedure Diagnosis Wound #: Same as Pre-Procedure Plan Follow-up Appointments: Return Appointment in 1 week. - Dr. Dellia Nims Bathing/ Shower/ Hygiene: May shower with protection but do not get wound dressing(s) wet. Edema Control - Lymphedema / SCD / Other: Lymphedema Pumps. Use Lymphedema pumps on leg(s) 2-3 times a day for 45-60 minutes. If wearing any wraps or hose, do not remove them. Continue exercising as instructed. - use over compression wraps Elevate legs to the level of the heart or above for 30 minutes daily and/or when sitting, a frequency of: - throughout the day Avoid  standing for long periods of time. Exercise regularly Non Wound Condition: Apply the following to affected area as directed: - Apply silver alginate to any weeping areas on left leg, cover with ABDs and [employer]. Use TCA/Zinc Cream and ABD pad in fold of posterior ankles. WOUND #1: - Lower Leg Wound Laterality: Right, Posterior Cleanser: Soap and Water 1 x Per Week/15 Days Discharge Instructions: May shower and wash wound with dial antibacterial soap and water prior to dressing change. Cleanser: Wound Cleanser 1 x Per Week/15 Days Discharge Instructions: Cleanse the wound with wound cleanser prior to applying a clean dressing using gauze sponges, not tissue or cotton balls. Peri-Wound Care: Triamcinolone 15 (g) 1 x Per Week/15 Days Discharge Instructions: Use triamcinolone 15 (g) as directed Peri-Wound Care: Zinc Oxide Ointment 30g tube 1 x Per Week/15 Days Discharge Instructions: Apply Zinc Oxide mixed with ketoconozole and TCA cream to irritated area posterior ankle Prim Dressing: KerraCel Ag Gelling Fiber Dressing, 2x2 in (silver alginate) 1 x Per Week/15 Days ary Discharge Instructions: Apply silver alginate to wound bed as instructed Secondary Dressing: Woven Gauze Sponge, Non-Sterile 4x4 in 1 x Per Week/15 Days Discharge Instructions: Apply over primary dressing as directed. Secondary Dressing: ABD Pad, 5x9 1 x Per Week/15 Days Discharge Instructions: Apply over primary dressing in skin fold posterior ankle Com pression Wrap: Unnaboot w/Calamine, 4x10 (in/yd) 1 x Per Week/15 Days Discharge Instructions: Apply Unnaboot  as directed. May also use Miliken CoFlex Calamine 2 layer compression system as alternative. 1. We are continuing to use silver alginate ABDs and 4-layer compression bilaterally 2. As long it is not painful I advised her to use her compression pumps twice a day over the top of the 4-layer compression 3. We talked about ordering her external compression stockings however  in lieu of this I think it would be better for her to go back to the lymphedema clinic once we get all the areas to close. Perhaps next week if she is able to use her pumps Electronic Signature(s) Signed: 03/25/2021 4:53:39 PM By: [name] MD Entered By: [name] on 03/25/2021 14:53:43 -------------------------------------------------------------------------------- SuperBill Details Patient Name: Date of Service: [name], [name] 03/25/2021 Medical Record Number: [id number] Patient Account Number: [id number] Date of Birth/Sex: Treating RN: 07-09-1941 (80 y.o. [name], [name] Primary Care Provider: [name] Other Clinician: Referring Provider: Treating Provider/Extender: Heidi Dach in Treatment: 9 Diagnosis Coding ICD-10 Codes Code Description I89.0 Lymphedema, not elsewhere classified [id number] Non-pressure chronic ulcer of right calf limited to breakdown of skin L97.828 Non-pressure chronic ulcer of other part of left lower leg with other specified severity L03.116 Cellulitis of left lower limb Physician Procedures : CPT4 Code Description Modifier [id number] - WC PHYS LEVEL 3 - EST PT ICD-10 Diagnosis Description L97.211 Non-pressure chronic ulcer of right calf limited to breakdown of skin L97.828 Non-pressure chronic ulcer of other part of left lower leg with  other specified severity I89.0 Lymphedema, not elsewhere classified Quantity: 1 Electronic Signature(s) Signed: 03/25/2021 4:53:39 PM By: [name] MD Entered By: [name] on 03/25/2021 14:54:08

## 2021-03-25 NOTE — Progress Notes (Signed)
HPI Lisa [name] returns today for followup. She is a pleasant morbidly obese 80 yo woman with chronic systolic heart failure, PAF, and VT. She is s/p Biv ICD insertion. She has mostly recovered from Covid 3 weeks ago. She has gradually felt better. She remains on amiodarone 200 mg daily. She has felt better in the past few weeks. She is fairly sedentary. No additional syncope or ICD therapies. She is pending the placement of compression pumps for her legs.  Allergies  Allergen Reactions   Flagyl [Metronidazole] Itching, Rash and Other (See Comments)    Welts, also   Penicillins Hives, Itching and Rash    Has patient had a PCN reaction causing immediate rash, facial/tongue/throat swelling, SOB or lightheadedness with hypotension: Yes  Has patient had a PCN reaction causing severe rash involving mucus membranes or skin necrosis: Yes Has patient had a PCN reaction that required hospitalization No Has patient had a PCN reaction occurring within the last 10 years: NO If all of the above answers are "NO", then may proceed with Cephalosporin use.    Sulfamethoxazole Rash   Meperidine Nausea And Vomiting   Morphine And Related Nausea And Vomiting   Tape Other (See Comments)    "THE PLASTIC, CLEAR TAPE CAN/DOES PULL OFF MY SKIN."   Doxycycline Nausea Only, Rash and Other (See Comments)    Made her "feel terrible"   Lanolin Itching and Rash   Sulfa Antibiotics Rash     Current Outpatient Medications  Medication Sig Dispense Refill   acetaminophen (TYLENOL) 500 MG tablet Take 500-1,000 mg by mouth every 6 (six) hours as needed for moderate pain.     allopurinol (ZYLOPRIM) 100 MG tablet Take 100 mg by mouth daily.      amiodarone (PACERONE) 200 MG tablet Take 200 mg by mouth daily.     aspirin EC 81 MG tablet Take 81 mg by mouth at bedtime.      atorvastatin (LIPITOR) 20 MG tablet Take 1 tablet (20 mg total) by mouth daily. 90 tablet 3   calcium carbonate (OS-CAL - DOSED IN MG OF  ELEMENTAL CALCIUM) 1250 (500 Ca) MG tablet Take 1 tablet by mouth.     carvedilol (COREG) 25 MG tablet TAKE ONE TABLET BY MOUTH TWICE DAILY 180 tablet 0   Cholecalciferol (VITAMIN D-3 PO) Take 5,000 Units by mouth daily.     imatinib (GLEEVEC) 400 MG tablet Take 400 mg by mouth daily with supper.      Inulin (METAMUCIL CLEAR & NATURAL PO) Take by mouth See admin instructions. Mix 1 teaspoonful into 8 ounces of water and drink as needed     Magnesium Oxide 400 MG CAPS Take 1 capsule (400 mg total) by mouth 2 (two) times daily. 180 capsule 1   Multiple Vitamin (MULTIVITAMIN WITH MINERALS) TABS tablet Take 1 tablet by mouth daily.     nitroGLYCERIN (NITROSTAT) 0.4 MG SL tablet Place 1 tablet (0.4 mg total) under the tongue every 5 (five) minutes x 3 doses as needed for chest pain. 25 tablet 3   potassium chloride (KLOR-CON) 10 MEQ tablet Take 10 mEq by mouth every other day. Per patient taking 2 tablets of 10 mEq to equal 20 mEq.     silver sulfADIAZINE (SILVADENE) 1 % cream Apply topically.     thyroid (ARMOUR) 15 MG tablet Take 15 mg by mouth daily before breakfast.      torsemide (DEMADEX) 20 MG tablet Take 1 tablet (20 mg total) by  mouth as directed. (Patient taking differently: Take 20 mg by mouth as directed. two days on one day off with potassium) 90 tablet 3   triamcinolone ointment (KENALOG) 0.1 % Apply a small amount to the affected area bid     No current facility-administered medications for this visit.     Past Medical History:  Diagnosis Date   CAD in native artery 2017   stent to LAD   Cardiac arrest ([place]) 09/2015   CHF (congestive heart failure) (HCC)    CML (chronic myelocytic leukemia) (HCC)    Complication of anesthesia    Hx: UTI (urinary tract infection)    Hypertension    Melanoma (HCC)    PONV (postoperative nausea and vomiting)    PVC (premature ventricular contraction)    HISTORY OF   Thyroid disease    V tach ([place])     ROS:   All systems reviewed and  negative except as noted in the HPI.   Past Surgical History:  Procedure Laterality Date   ABDOMINAL HYSTERECTOMY     APPENDECTOMY     CARDIAC CATHETERIZATION N/A 10/03/2015   Procedure: Left Heart Cath and Coronary Angiography;  Surgeon: [name], MD;  Location: [facility];  Service: Cardiovascular;  Laterality: N/A;   CARDIAC CATHETERIZATION N/A 10/07/2015   Procedure: Coronary Stent Intervention;  Surgeon: [name], MD;  Location: [facility];  Service: Cardiovascular;  Laterality: N/A;   CARDIAC CATHETERIZATION  09/22/2019   EP IMPLANTABLE DEVICE N/A 01/20/2016   Procedure: BiV ICD Insertion CRT-D;  Surgeon: [name], MD;  Location: [facility];  Service: Cardiovascular;  Laterality: N/A;   LEFT HEART CATH AND CORONARY ANGIOGRAPHY N/A 09/22/2019   Procedure: LEFT HEART CATH AND CORONARY ANGIOGRAPHY;  Surgeon: Martinique, Peter M, MD;  Location: [facility];  Service: Cardiovascular;  Laterality: N/A;   TONSILLECTOMY     TONSILLECTOMY     TUBAL LIGATION       Family History  Problem Relation Age of Onset   Coronary artery disease Mother    Thyroid disease Mother    Multiple myeloma Father    Heart attack Maternal Grandfather    Heart attack Paternal Grandfather      Social History   Socioeconomic History   Marital status: Divorced    Spouse name: Not on file   Number of children: Not on file   Years of education: Not on file   Highest education level: Not on file  Occupational History   Not on file  Tobacco Use   Smoking status: Never   Smokeless tobacco: Never  Vaping Use   Vaping Use: Never used  Substance and Sexual Activity   Alcohol use: No    Alcohol/week: 0.0 standard drinks   Drug use: No   Sexual activity: Not on file  Other Topics Concern   Not on file  Social History Narrative   Not on file   Social Determinants of Health   Financial Resource Strain: Not on file  Food Insecurity: Not on file   Transportation Needs: Not on file  Physical Activity: Not on file  Stress: Not on file  Social Connections: Not on file  Intimate Partner Violence: Not on file     BP (!) 132/56   Pulse 67   Ht '5\' 2"'  (1.575 m)   Wt 260 lb 3.2 oz (118 kg)   LMP 02/14/1995   SpO2 96%   BMI 47.59 kg/m  Physical Exam:  Morbidly obese appearing woman, NAD HEENT: Unremarkable Neck:  6 cm JVD, no thyromegally Lymphatics:  No adenopathy Back:  No CVA tenderness Lungs:  Clear with no wheezes HEART:  Regular rate rhythm, no murmurs, no rubs, no clicks Abd:  soft, positive bowel sounds, no organomegally, no rebound, no guarding Ext:  2 plus pulses, massive lymphedema bilaterally, no cyanosis, no clubbing Skin:  No rashes no nodules Neuro:  CN II through XII intact, motor grossly intact  EKG - nsr with biv pacing  DEVICE  Normal device function.  See PaceArt for details.   Assess/Plan:  Chronic systolic heart failure - her symptoms are class 2. She will continue her current meds. VT - she has been quiet over the past few months. I have encouraged her to reduce her salt intake. ICD - her medtronic biv ICD is working normally. She has 2.5 years of battery longevity. Lymphedema - she will start using the compression pumps. Hopefully the edema will improve.  Lisa [name] Lisa Sperling,MD

## 2021-03-26 ENCOUNTER — Encounter: Payer: Medicare Other | Admitting: Occupational Therapy

## 2021-03-28 ENCOUNTER — Encounter: Payer: Medicare Other | Admitting: Occupational Therapy

## 2021-03-31 ENCOUNTER — Encounter: Payer: Medicare Other | Admitting: Occupational Therapy

## 2021-04-01 ENCOUNTER — Other Ambulatory Visit: Payer: Self-pay

## 2021-04-01 ENCOUNTER — Encounter (HOSPITAL_BASED_OUTPATIENT_CLINIC_OR_DEPARTMENT_OTHER): Payer: Medicare Other | Admitting: Internal Medicine

## 2021-04-01 DIAGNOSIS — I890 Lymphedema, not elsewhere classified: Secondary | ICD-10-CM | POA: Diagnosis not present

## 2021-04-02 ENCOUNTER — Encounter: Payer: Medicare Other | Admitting: Occupational Therapy

## 2021-04-02 NOTE — Progress Notes (Signed)
KETARA, CAVNESS ([id number]) Visit Report for 04/01/2021 Arrival Information Details Patient Name: Date of Service: [name], [name] 04/01/2021 2:00 PM Medical Record Number: [id number] Patient Account Number: [id number] Date of Birth/Sex: Treating RN: [birth year] (80 y.o. Lisa [name] Primary Care Lisa [name]: Lisa [name] Other Clinician: Referring Lisa [name]: Treating Lisa [name]/Extender: Lisa [name] in Treatment: 10 Visit Information History Since Last Visit Added or deleted any medications: No Patient Arrived: Lisa [name] Any new allergies or adverse reactions: No Arrival Time: 14:26 Had a fall or experienced change in No Transfer Assistance: None activities of daily living that may affect Patient Identification Verified: Yes risk of falls: Secondary Verification Process Completed: Yes Signs or symptoms of abuse/neglect since last visito No Patient Requires Transmission-Based Precautions: No Hospitalized since last visit: No Patient Has Alerts: Yes Implantable device outside of the clinic excluding No Patient Alerts: Patient on Blood Thinner cellular tissue based products placed in the center Defibrillator since last visit: ABI=[place] Bilaterally Has Dressing in Place as Prescribed: Yes Has Compression in Place as Prescribed: Yes Pain Present Now: No Electronic Signature(s) Signed: 04/01/2021 5:51:35 PM By: Lorrin Jackson Entered By: Lorrin Jackson on 04/01/2021 14:30:29 -------------------------------------------------------------------------------- Compression Therapy Details Patient Name: Date of Service: [name], [name] 04/01/2021 2:00 PM Medical Record Number: [id number] Patient Account Number: [id number] Date of Birth/Sex: Treating RN: 24-Jun-1941 (80 y.o. Lisa [name], Lisa [name] Primary Care Lisa [name]: Lisa [name] Other Clinician: Referring Lisa [name]: Treating Lacretia Tindall/Extender: Lisa [name] in  Treatment: 10 Compression Therapy Performed for Wound Assessment: NonWound Condition Lymphedema - Left Leg Performed By: Clinician Lisa Hammock, RN Compression Type: Rolena Infante Post Procedure Diagnosis Same as Pre-procedure Electronic Signature(s) Signed: 04/01/2021 5:31:05 PM By: Lisa Hammock RN Entered By: Lisa [name] on 04/01/2021 15:23:54 -------------------------------------------------------------------------------- Compression Therapy Details Patient Name: Date of Service: [name], [name] 04/01/2021 2:00 PM Medical Record Number: [id number] Patient Account Number: [id number] Date of Birth/Sex: Treating RN: [birth year] (80 y.o. Lisa [name], Lisa [name] Primary Care Zaion Hreha: Lisa [name] Other Clinician: Referring Shantal Roan: Treating Winnie Barsky/Extender: Lisa [name] in Treatment: 10 Compression Therapy Performed for Wound Assessment: Wound #1 Right,Posterior Lower Leg Performed By: Clinician Lisa Hammock, RN Compression Type: Rolena Infante Post Procedure Diagnosis Same as Pre-procedure Electronic Signature(s) Signed: 04/01/2021 5:31:05 PM By: Lisa Hammock RN Entered By: Lisa [name] on 04/01/2021 15:25:10 -------------------------------------------------------------------------------- Encounter Discharge Information Details Patient Name: Date of Service: [name], [name] 04/01/2021 2:00 PM Medical Record Number: [id number] Patient Account Number: [id number] Date of Birth/Sex: Treating RN: [birth year] (80 y.o. Lisa [name] Primary Care Tasean Mancha: Lisa [name] Other Clinician: Referring Lisa [name]: Treating Lisa [name]/Extender: Lisa [name] in Treatment: 10 Encounter Discharge Information Items Discharge Condition: Stable Ambulatory Status: Lisa [name] Discharge Destination: Home Transportation: Private Auto Schedule Follow-up Appointment: Yes Clinical Summary of Care: Provided on  04/01/2021 Form Type Recipient Paper Patient Patient Electronic Signature(s) Signed: 04/01/2021 5:26:57 PM By: Lorrin Jackson Entered By: Lorrin Jackson on 04/01/2021 17:26:57 -------------------------------------------------------------------------------- Lower Extremity Assessment Details Patient Name: Date of Service: [name], [name] 04/01/2021 2:00 PM Medical Record Number: [id number] Patient Account Number: [id number] Date of Birth/Sex: Treating RN: [birth year] (80 y.o. Lisa [name] Primary Care Lisa [name]: Lisa [name] Other Clinician: Referring Lisa [name]: Treating Lisa [name]/Extender: Lisa [name] in Treatment: 10 Edema Assessment Assessed: [name]: Yes] Lisa [name]: Yes] Edema: [Left: Yes] [Right: Yes] Calf Left: Right: Point of Measurement: 30 cm From Medial Instep 66 cm 67 cm Ankle Left: Right: Point  of Measurement: 6 cm From Medial Instep 33 cm 35 cm Vascular Assessment Pulses: Dorsalis Pedis Palpable: [Left:Yes] [Right:Yes] Electronic Signature(s) Signed: 04/01/2021 5:51:35 PM By: Lorrin Jackson Entered By: Lorrin Jackson on 04/01/2021 14:43:47 -------------------------------------------------------------------------------- Multi Wound Chart Details Patient Name: Date of Service: [name], [name] 04/01/2021 2:00 PM Medical Record Number: [id number] Patient Account Number: [id number] Date of Birth/Sex: Treating RN: 1941-06-07 (80 y.o. Lisa [name], Lisa [name] Primary Care Lisa [name]: Lisa [name] Other Clinician: Referring Lisa [name]: Treating Lisa [name]/Extender: Lisa [name] in Treatment: 10 Vital Signs Height(in): 62 Pulse(bpm): 9 Weight(lbs): 268 Blood Pressure(mmHg): 133/76 Body Mass Index(BMI): 49 Temperature(F): 98.1 Respiratory Rate(breaths/min): 20 Photos: [N/A:N/A] Right, Posterior Lower Leg N/A N/A Wound Location: Gradually Appeared N/A N/A Wounding Event: Lymphedema N/A  N/A Primary Etiology: Lymphedema, Arrhythmia, Congestive N/A N/A Comorbid History: Heart Failure, Coronary Artery Disease, Hypertension, Myocardial Infarction, Gout, Osteoarthritis, Received Chemotherapy 09/30/2020 N/A N/A Date Acquired: 10 N/A N/A Weeks of Treatment: Open N/A N/A Wound Status: 0.1x0.1x0.1 N/A N/A Measurements L x W x D (cm) 0.008 N/A N/A A (cm) : rea 0.001 N/A N/A Volume (cm) : 99.70% N/A N/A % Reduction in Area: 99.80% N/A N/A % Reduction in Volume: Full Thickness Without Exposed N/A N/A Classification: Support Structures Medium N/A N/A Exudate Amount: Serosanguineous N/A N/A Exudate Type: red, brown N/A N/A Exudate Color: Distinct, outline attached N/A N/A Wound Margin: Large (67-100%) N/A N/A Granulation Amount: Red, Pink N/A N/A Granulation Quality: None Present (0%) N/A N/A Necrotic Amount: Fascia: No N/A N/A Exposed Structures: Fat Layer (Subcutaneous Tissue): No Tendon: No Muscle: No Joint: No Bone: No Medium (34-66%) N/A N/A Epithelialization: Compression Therapy N/A N/A Procedures Performed: Treatment Notes Electronic Signature(s) Signed: 04/01/2021 5:31:05 PM By: Lisa Hammock RN Signed: 04/02/2021 8:02:04 AM By: [name] MD Entered By: [name] on 04/01/2021 15:27:46 -------------------------------------------------------------------------------- Multi-Disciplinary Care Plan Details Patient Name: Date of Service: [name], [name] 04/01/2021 2:00 PM Medical Record Number: [id number] Patient Account Number: [id number] Date of Birth/Sex: Treating RN: 21-Sep-1940 (80 y.o. Lisa [name], Lisa [name] Primary Care Sonja Manseau: Lisa [name] Other Clinician: Referring Hervey Wedig: Treating Areyana Leoni/Extender: Lisa [name] in Treatment: 10 Multidisciplinary Care Plan reviewed with physician Active Inactive Venous Leg Ulcer Nursing Diagnoses: Knowledge deficit related to disease process  and management Potential for venous Insuffiency (use before diagnosis confirmed) Goals: Patient will maintain optimal edema control Date Initiated: 01/20/2021 Target Resolution Date: 04/13/2021 Goal Status: Active Interventions: Assess peripheral edema status every visit. Compression as ordered Treatment Activities: Therapeutic compression applied : 01/20/2021 Notes: 03/25/21: Edema improved but ongoing issue. Wound/Skin Impairment Nursing Diagnoses: Impaired tissue integrity Knowledge deficit related to ulceration/compromised skin integrity Goals: Patient/caregiver will verbalize understanding of skin care regimen Date Initiated: 01/20/2021 Date Inactivated: 03/25/2021 Target Resolution Date: 03/21/2021 Goal Status: Met Ulcer/skin breakdown will have a volume reduction of 30% by week 4 Date Initiated: 01/20/2021 Date Inactivated: 02/21/2021 Target Resolution Date: 02/17/2021 Goal Status: Met Ulcer/skin breakdown will have a volume reduction of 50% by week 8 Date Initiated: 02/21/2021 Date Inactivated: 03/25/2021 Target Resolution Date: 03/21/2021 Goal Status: Met Ulcer/skin breakdown will have a volume reduction of 80% by week 12 Date Initiated: 03/25/2021 Target Resolution Date: 04/30/2021 Goal Status: Active Interventions: Assess patient/caregiver ability to obtain necessary supplies Assess patient/caregiver ability to perform ulcer/skin care regimen upon admission and as needed Assess ulceration(s) every visit Provide education on ulcer and skin care Treatment Activities: Skin care regimen initiated : 01/20/2021 Topical wound management initiated : 01/20/2021 Notes: Electronic Signature(s) Signed:  04/01/2021 5:31:05 PM By: Lisa Hammock RN Entered By: Lisa [name] on 04/01/2021 17:13:28 -------------------------------------------------------------------------------- Pain Assessment Details Patient Name: Date of Service: [name], [name] 04/01/2021 2:00  PM Medical Record Number: [id number] Patient Account Number: [id number] Date of Birth/Sex: Treating RN: 02-11-1941 (80 y.o. Lisa [name] Primary Care Jaquasha Carnevale: Lisa [name] Other Clinician: Referring Harvin Konicek: Treating Othell Jaime/Extender: Lisa [name] in Treatment: 10 Active Problems Location of Pain Severity and Description of Pain Patient Has Paino No Site Locations Pain Management and Medication Current Pain Management: Electronic Signature(s) Signed: 04/01/2021 5:51:35 PM By: Lorrin Jackson Entered By: Lorrin Jackson on 04/01/2021 14:30:40 -------------------------------------------------------------------------------- Patient/Caregiver Education Details Patient Name: Date of Service: [name], MA Lisa Chauncey Cruel 8/23/2022andnbsp2:00 PM Medical Record Number: [id number] Patient Account Number: [id number] Date of Birth/Gender: Treating RN: 07/30/1941 (80 y.o. Lisa [name], Lisa [name] Primary Care Physician: Lisa [name] Other Clinician: Referring Physician: Treating Physician/Extender: Lisa [name] in Treatment: 10 Education Assessment Education Provided To: Patient Education Topics Provided Basic Hygiene: Responses: Reinforcements needed Wound/Skin Impairment: Methods: Explain/Verbal Responses: State content correctly Electronic Signature(s) Signed: 04/01/2021 5:31:05 PM By: Lisa Hammock RN Entered By: Lisa [name] on 04/01/2021 17:13:43 -------------------------------------------------------------------------------- Wound Assessment Details Patient Name: Date of Service: [name], [name] 04/01/2021 2:00 PM Medical Record Number: [id number] Patient Account Number: [id number] Date of Birth/Sex: Treating RN: 1940/08/31 (80 y.o. Lisa [name], Lisa [name] Primary Care Kalieb Freeland: Lisa [name] Other Clinician: Referring Clela Hagadorn: Treating Deshawn Witty/Extender: Lisa [name] in  Treatment: 10 Wound Status Wound Number: 1 Primary Lymphedema Etiology: Wound Location: Right, Posterior Lower Leg Wound Open Wounding Event: Gradually Appeared Status: Date Acquired: 09/30/2020 Comorbid Lymphedema, Arrhythmia, Congestive Heart Failure, Coronary Weeks Of Treatment: 10 History: Artery Disease, Hypertension, Myocardial Infarction, Gout, Clustered Wound: No Osteoarthritis, Received Chemotherapy Photos Wound Measurements Length: (cm) 0.1 Width: (cm) 0.1 Depth: (cm) 0.1 Area: (cm) 0.008 Volume: (cm) 0.001 % Reduction in Area: 99.7% % Reduction in Volume: 99.8% Epithelialization: Medium (34-66%) Tunneling: No Undermining: No Wound Description Classification: Full Thickness Without Exposed Support Structures Wound Margin: Distinct, outline attached Exudate Amount: Medium Exudate Type: Serosanguineous Exudate Color: red, brown Foul Odor After Cleansing: No Slough/Fibrino No Wound Bed Granulation Amount: Large (67-100%) Exposed Structure Granulation Quality: Red, Pink Fascia Exposed: No Necrotic Amount: None Present (0%) Fat Layer (Subcutaneous Tissue) Exposed: No Tendon Exposed: No Muscle Exposed: No Joint Exposed: No Bone Exposed: No Treatment Notes Wound #1 (Lower Leg) Wound Laterality: Right, Posterior Cleanser Soap and Water Discharge Instruction: May shower and wash wound with dial antibacterial soap and water prior to dressing change. Wound Cleanser Discharge Instruction: Cleanse the wound with wound cleanser prior to applying a clean dressing using gauze sponges, not tissue or cotton balls. Peri-Wound Care Triamcinolone 15 (g) Discharge Instruction: Use triamcinolone 15 (g) as directed Zinc Oxide Ointment 30g tube Discharge Instruction: Apply Zinc Oxide mixed with ketoconozole and TCA cream to irritated area posterior ankle Topical Primary Dressing KerraCel Ag Gelling Fiber Dressing, 2x2 in (silver alginate) Discharge Instruction: Apply  silver alginate to wound bed as instructed Secondary Dressing Woven Gauze Sponge, Non-Sterile 4x4 in Discharge Instruction: Apply over primary dressing as directed. ABD Pad, 5x9 Discharge Instruction: Apply over primary dressing in skin fold posterior ankle Secured With Compression Wrap Unnaboot w/Calamine, 4x10 (in/yd) Discharge Instruction: Apply Unnaboot as directed. May also use Miliken CoFlex Calamine 2 layer compression system as alternative. Compression Stockings Add-Ons Electronic Signature(s) Signed: 04/01/2021 5:31:05 PM By: Lisa Hammock RN Entered By: Lisa [name] on 04/01/2021  15:23:36 -------------------------------------------------------------------------------- Vitals Details Patient Name: Date of Service: [name], [name] 04/01/2021 2:00 PM Medical Record Number: [id number] Patient Account Number: [id number] Date of Birth/Sex: Treating RN: January 23, 1941 (80 y.o. Lisa [name] Primary Care Braison Snoke: Lisa [name] Other Clinician: Referring Breton Berns: Treating Cathern Tahir/Extender: Lisa [name] in Treatment: 10 Vital Signs Time Taken: 14:30 Temperature (F): 98.1 Height (in): 62 Pulse (bpm): 66 Weight (lbs): 268 Respiratory Rate (breaths/min): 20 Body Mass Index (BMI): 49 Blood Pressure (mmHg): 133/76 Reference Range: 80 - 120 mg / dl Electronic Signature(s) Signed: 04/01/2021 5:51:35 PM By: Lorrin Jackson Entered By: Lorrin Jackson on 04/01/2021 14:35:11

## 2021-04-02 NOTE — Progress Notes (Signed)
RASCHEL, [name] ([id number]) Visit Report for 04/01/2021 HPI Details Patient Name: Date of Service: [name], [name] 04/01/2021 2:00 PM Medical Record Number: [id number] Patient Account Number: [id number] Date of Birth/Sex: Treating RN: [birth year] (80 y.o. Lisa [name], Lauren Primary Care Provider: [name] Other Clinician: Referring Provider: Treating Provider/Extender: Heidi Dach in Treatment: 10 History of Present Illness HPI Description: ADMISSION 01/20/2021 This is a 80 year old woman who tells me that she has had problems with lymphedema since the 1970s. She does not have, or has not been given, a clear explanation for this. She has been followed for many years at various lymphedema clinics including in [place] and then in [place] and now sees Stephani Police at [facility]. Developed a wound on the right posterior calf about a month ago. When that happened she is no longer able to have lymphedema therapy on this leg. She has been seeing her primary doctor who dressed this with DuoDERM and more recently with an Haematologist. The wounds were also debrided on the posterior The patient has 79 year old compression pumps but I am not sure of the frequency of their use she also has juxta lites but again I am not sure how frequently unreliably the patient is using them. She has open wounds on the right posterior and weeping de-epithelialized area predominantly on the left anterior and left anterior lateral. 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 tach and has a cardioverter defibrillator, gout and obstructive sleep apnea. She also has had a melanoma on the right leg and there is an extensive scar in the middle of lymphedema on the right anterior lower leg ABIs in our clinic were  noncompressible bilaterally 6/20; patient's wound on the right posterior calf is better. She brought in her compression pumps today and says that he still do not fit her leg. She also has external compression stockings at home but she did not bring those in today. We have her in 4-layer compression she is actually doing quite well much less swelling in the right leg She has old melanoma surgery on her right leg but she is quite adamant that she did not have any radiation of this was back in the 1970s 6/29; right posterior calf wound. This appears somewhat dry but looks as though it is attempting to epithelialize. We have better edema control. We have somebody coming out to go over her compression pumps with her on 7/12. 7/15; patient presents because she is having increased irritation to her ankles. She has reported more weeping than usual to her lower extremities. She would like to switch from the Coflex used at last clinic visit to a 4-layer compression wrap. She also states she does not want to use Hydrofera Blue. Her compression pumps are coming in on Wednesday. 7/19; I have not seen this patient in quite a while. At that point we are dealing with an area on the right posterior calf in the setting of severe bilateral lymphedema. I do not remember they are actually being a problem on the left leg. However Dr. Heber [place] reports significant erythema and weeping on the left lower leg last week. We initially were using Coflex and change this to 4-layer compression last week although this is not really helped. She comes in today with a lot of discomfort around the left ankle and marked weeping edema. Complicating  things she is traveling to [facility] leaving on Sunday. She says that some members of her family or physicians and presumably they might be able to help with an Ace wrap beyond that I do not know what were going to do here. She has made arrangements for a visitation for her compression  pumps this Thursday. 8/1; patient presents for 2-week follow-up. She has been using Unna boots with silver alginate underneath. She reports improvement in her wounds. She overall reports feeling well. She has no issues or complaints today. She denies signs of infection. 8/16; patient comes back in follow-up. Since last time I saw her things are actually going quite a bit better. She has a weeping area on the right lower ankle pantaloon deformity posteriorly and probably an area just above the ankle anteriorly on the left as well. She has compression pumps which came last week however she was ill with COVID and did not get started on them. She is familiar with the lymphedema clinic in [place] and I think we are going to have to refer her back there. 8/23; she arrives in clinic today with erythema over the majority of her right lower leg. She says that over Saturday night she felt a sudden pain in her right leg rolled over and feels that drainage got stuck on the bed and it pulled her skin off. Since then she has not been using the compression pumps. She arrives in clinic today with the discoloration I which I think is probably stasis dermatitis rather than cellulitis. She is not systemically unwell. The plan here is to get her areas to close and then to refer her back to the lymphedema clinic. I doubt we could order her standard compression stockings. She has her upgraded compression pumps but for 1 reason or another we have not been able to get these on for a week reliably. I think she still has an area on the right posterior calf that is weeping and open although certainly close to closing. We are using silver alginate under Unna boot compression Electronic Signature(s) Signed: 04/02/2021 8:02:04 AM By: [name] MD Entered By: [name] on 04/01/2021 15:31:54 -------------------------------------------------------------------------------- Physical Exam Details Patient Name: Date  of Service: [name], [name] 04/01/2021 2:00 PM Medical Record Number: [id number] Patient Account Number: [id number] Date of Birth/Sex: Treating RN: 06/03/1941 (80 y.o. [name] Primary Care Provider: [name] Other Clinician: Referring Provider: Treating Provider/Extender: Heidi Dach in Treatment: 10 Constitutional Sitting or standing Blood Pressure is within target range for patient.. Pulse regular and within target range for patient.[name] Respirations regular, non-labored and within target range.. Temperature is normal and within the target range for the patient.[name] Appears in no distress. Cardiovascular Pedal pulses are palpable. Edema present in both extremities. Severe right greater than left lower extremity nonpitting edema. Notes Wound exam; right lower extremity posterior pantaloon deformity. Still an area of this weeping and not completely epithelialized although I think this is minor. She has severe right much greater than left lymphedema. She has erythema and discoloration in the right lower leg worse posteriorly. Slightly warmer than the left. This is not overtly tender although there is some tenderness. Electronic Signature(s) Signed: 04/02/2021 8:02:04 AM By: [name] MD Entered By: [name] on 04/01/2021 16:18:40 -------------------------------------------------------------------------------- Physician Orders Details Patient Name: Date of Service: [place], [name] 04/01/2021 2:00 PM Medical Record Number: [id number] Patient Account Number: [id number] Date of Birth/Sex: Treating RN: August 13, 1940 (80  y.o. Lisa [name], Lauren Primary Care Provider: [name] Other Clinician: Referring Provider: Treating Provider/Extender: Heidi Dach in Treatment: 10 Verbal / Phone Orders: No Diagnosis Coding Follow-up Appointments ppointment in 1 week. - Dr. Dellia Nims Return A Bathing/  Shower/ Hygiene May shower with protection but do not get wound dressing(s) wet. Edema Control - Lymphedema / SCD / Other Bilateral Lower Extremities Lymphedema Pumps. Use Lymphedema pumps on leg(s) 2-3 times a day for 45-60 minutes. If wearing any wraps or hose, do not remove them. Continue exercising as instructed. - use over compression wraps Elevate legs to the level of the heart or above for 30 minutes daily and/or when sitting, a frequency of: - throughout the day Avoid standing for long periods of time. Exercise regularly Other Edema Control Orders/Instructions: - unna boots left leg alsos Non Wound Condition Left Lower Extremity pply the following to affected area as directed: - Apply silver alginate to any weeping areas on left leg, cover with ABDs and [employer]. Use A TCA/Zinc Cream and ABD pad in fold of posterior ankles. Wound Treatment Wound #1 - Lower Leg Wound Laterality: Right, Posterior Cleanser: Soap and Water 1 x Per Week/15 Days Discharge Instructions: May shower and wash wound with dial antibacterial soap and water prior to dressing change. Cleanser: Wound Cleanser 1 x Per Week/15 Days Discharge Instructions: Cleanse the wound with wound cleanser prior to applying a clean dressing using gauze sponges, not tissue or cotton balls. Peri-Wound Care: Triamcinolone 15 (g) 1 x Per Week/15 Days Discharge Instructions: Use triamcinolone 15 (g) as directed Peri-Wound Care: Zinc Oxide Ointment 30g tube 1 x Per Week/15 Days Discharge Instructions: Apply Zinc Oxide mixed with ketoconozole and TCA cream to irritated area posterior ankle Prim Dressing: KerraCel Ag Gelling Fiber Dressing, 2x2 in (silver alginate) 1 x Per Week/15 Days ary Discharge Instructions: Apply silver alginate to wound bed as instructed Secondary Dressing: Woven Gauze Sponge, Non-Sterile 4x4 in 1 x Per Week/15 Days Discharge Instructions: Apply over primary dressing as directed. Secondary Dressing: ABD Pad,  5x9 1 x Per Week/15 Days Discharge Instructions: Apply over primary dressing in skin fold posterior ankle Compression Wrap: Unnaboot w/Calamine, 4x10 (in/yd) 1 x Per Week/15 Days Discharge Instructions: Apply Unnaboot as directed. May also use Miliken CoFlex Calamine 2 layer compression system as alternative. Patient Medications llergies: adhesive tape, meperidine, doxycycline, metronidazole, Opioids - Morphine Analogues, penicillin, sulfamethoxazole, wheat, lanolin A Notifications Medication Indication Start End celluitis right leg 04/01/2021 cephalexin DOSE oral 500 mg capsule - 1 capsule oral q6h for 7 days Electronic Signature(s) Signed: 04/01/2021 4:23:45 PM By: [name] MD Entered By: [name] on 04/01/2021 16:23:45 -------------------------------------------------------------------------------- Problem List Details Patient Name: Date of Service: [name], [name] 04/01/2021 2:00 PM Medical Record Number: [id number] Patient Account Number: [id number] Date of Birth/Sex: Treating RN: 1941/07/31 (80 y.o. Lisa [name], Lauren Primary Care Provider: [name] Other Clinician: Referring Provider: Treating Provider/Extender: Heidi Dach in Treatment: 10 Active Problems ICD-10 Encounter Code Description Active Date MDM Diagnosis I89.0 Lymphedema, not elsewhere classified 01/20/2021 No Yes L97.211 Non-pressure chronic ulcer of right calf limited to breakdown of skin 01/20/2021 No Yes L97.828 Non-pressure chronic ulcer of other part of left lower leg with other specified 01/20/2021 No Yes severity L03.115 Cellulitis of right lower limb 04/01/2021 No Yes Inactive Problems ICD-10 Code Description Active Date Inactive Date L03.116 Cellulitis of left lower limb 02/25/2021 02/25/2021 Resolved Problems Electronic Signature(s) Signed: 04/02/2021 8:02:04 AM By: [name] MD  Entered By: [name] on 04/01/2021  15:27:33 -------------------------------------------------------------------------------- Progress Note Details Patient Name: Date of Service: [name], [name] 04/01/2021 2:00 PM Medical Record Number: [id number] Patient Account Number: [id number] Date of Birth/Sex: Treating RN: 19-Nov-1940 (80 y.o. Lisa [name], Lauren Primary Care Provider: [name] Other Clinician: Referring Provider: Treating Provider/Extender: Heidi Dach in Treatment: 10 Subjective History of Present Illness (HPI) ADMISSION 01/20/2021 This is a 80 year old woman who tells me that she has had problems with lymphedema since the 1970s. She does not have, or has not been given, a clear explanation for this. She has been followed for many years at various lymphedema clinics including in [place] and then in [place] and now sees Stephani Police at [place]. Developed a wound on the right posterior calf about a month ago. When that happened she is no longer able to have lymphedema therapy on this leg. She has been seeing her primary doctor who dressed this with DuoDERM and more recently with an Haematologist. The wounds were also debrided on the posterior The patient has 80 year old compression pumps but I am not sure of the frequency of their use she also has juxta lites but again I am not sure how frequently unreliably the patient is using them. She has open wounds on the right posterior and weeping de-epithelialized area predominantly on the left anterior and left anterior lateral. 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 tach and has a cardioverter defibrillator, gout and obstructive sleep apnea. She also has had a melanoma on the right leg and there is an extensive scar in the middle of lymphedema on the right  anterior lower leg ABIs in our clinic were noncompressible bilaterally 6/20; patient's wound on the right posterior calf is better. She brought in her compression pumps today and says that he still do not fit her leg. She also has external compression stockings at home but she did not bring those in today. We have her in 4-layer compression she is actually doing quite well much less swelling in the right leg She has old melanoma surgery on her right leg but she is quite adamant that she did not have any radiation of this was back in the 1970s 6/29; right posterior calf wound. This appears somewhat dry but looks as though it is attempting to epithelialize. We have better edema control. We have somebody coming out to go over her compression pumps with her on 7/12. 7/15; patient presents because she is having increased irritation to her ankles. She has reported more weeping than usual to her lower extremities. She would like to switch from the Coflex used at last clinic visit to a 4-layer compression wrap. She also states she does not want to use Hydrofera Blue. Her compression pumps are coming in on Wednesday. 7/19; I have not seen this patient in quite a while. At that point we are dealing with an area on the right posterior calf in the setting of severe bilateral lymphedema. I do not remember they are actually being a problem on the left leg. However Dr. Heber Liberal reports significant erythema and weeping on the left lower leg last week. We initially were using Coflex and change this to 4-layer compression last week although this is not really helped. She comes in today with a lot of discomfort around the left ankle and marked weeping  edema. Complicating things she is traveling to [facility] leaving on Sunday. She says that some members of her family or physicians and presumably they might be able to help with an Ace wrap beyond that I do not know what were going to do here. She has made arrangements  for a visitation for her compression pumps this Thursday. 8/1; patient presents for 2-week follow-up. She has been using Unna boots with silver alginate underneath. She reports improvement in her wounds. She overall reports feeling well. She has no issues or complaints today. She denies signs of infection. 8/16; patient comes back in follow-up. Since last time I saw her things are actually going quite a bit better. She has a weeping area on the right lower ankle pantaloon deformity posteriorly and probably an area just above the ankle anteriorly on the left as well. She has compression pumps which came last week however she was ill with COVID and did not get started on them. She is familiar with the lymphedema clinic in [place] and I think we are going to have to refer her back there. 8/23; she arrives in clinic today with erythema over the majority of her right lower leg. She says that over Saturday night she felt a sudden pain in her right leg rolled over and feels that drainage got stuck on the bed and it pulled her skin off. Since then she has not been using the compression pumps. She arrives in clinic today with the discoloration I which I think is probably stasis dermatitis rather than cellulitis. She is not systemically unwell. The plan here is to get her areas to close and then to refer her back to the lymphedema clinic. I doubt we could order her standard compression stockings. She has her upgraded compression pumps but for 1 reason or another we have not been able to get these on for a week reliably. I think she still has an area on the right posterior calf that is weeping and open although certainly close to closing. We are using silver alginate under Unna boot compression Objective Constitutional Sitting or standing Blood Pressure is within target range for patient.. Pulse regular and within target range for patient.[name] Respirations regular, non-labored and within target range..  Temperature is normal and within the target range for the patient.[name] Appears in no distress. Vitals Time Taken: 2:30 PM, Height: 62 in, Weight: 268 lbs, BMI: 49, Temperature: 98.1 F, Pulse: 66 bpm, Respiratory Rate: 20 breaths/min, Blood Pressure: 133/76 mmHg. Cardiovascular Pedal pulses are palpable. Edema present in both extremities. Severe right greater than left lower extremity nonpitting edema. General Notes: Wound exam; right lower extremity posterior pantaloon deformity. Still an area of this weeping and not completely epithelialized although I think this is minor. She has severe right much greater than left lymphedema. She has erythema and discoloration in the right lower leg worse posteriorly. Slightly warmer than the left. This is not overtly tender although there is some tenderness. Integumentary (Hair, Skin) Wound #1 status is Open. Original cause of wound was Gradually Appeared. The date acquired was: 09/30/2020. The wound has been in treatment 10 weeks. The wound is located on the Right,Posterior Lower Leg. The wound measures 0.1cm length x 0.1cm width x 0.1cm depth; 0.008cm^2 area and 0.001cm^3 volume. There is no tunneling or undermining noted. There is a medium amount of serosanguineous drainage noted. The wound margin is distinct with the outline attached to the wound base. There is large (67-100%) red, pink granulation within the wound  bed. There is no necrotic tissue within the wound bed. 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 There was a Haematologist Compression Therapy Procedure by Rhae Hammock, RN. Post procedure Diagnosis Wound #1: Same  as Pre-Procedure There was a Haematologist Compression Therapy Procedure by Rhae Hammock, RN. Post procedure Diagnosis Wound #: Same as Pre-Procedure Plan Follow-up Appointments: Return Appointment in 1 week. - Dr. Dellia Nims Bathing/ Shower/ Hygiene: May shower with protection but do not get wound dressing(s) wet. Edema Control - Lymphedema / SCD / Other: Lymphedema Pumps. Use Lymphedema pumps on leg(s) 2-3 times a day for 45-60 minutes. If wearing any wraps or hose, do not remove them. Continue exercising as instructed. - use over compression wraps Elevate legs to the level of the heart or above for 30 minutes daily and/or when sitting, a frequency of: - throughout the day Avoid standing for long periods of time. Exercise regularly Other Edema Control Orders/Instructions: - unna boots left leg alsos Non Wound Condition: Apply the following to affected area as directed: - Apply silver alginate to any weeping areas on left leg, cover with ABDs and [employer]. Use TCA/Zinc Cream and ABD pad in fold of posterior ankles. The following medication(s) was prescribed: cephalexin oral 500 mg capsule 1 capsule oral q6h for 7 days for celluitis right leg starting 04/01/2021 WOUND #1: - Lower Leg Wound Laterality: Right, Posterior Cleanser: Soap and Water 1 x Per Week/15 Days Discharge Instructions: May shower and wash wound with dial antibacterial soap and water prior to dressing change. Cleanser: Wound Cleanser 1 x Per Week/15 Days Discharge Instructions: Cleanse the wound with wound cleanser prior to applying a clean dressing using gauze sponges, not tissue or cotton balls. Peri-Wound Care: Triamcinolone 15 (g) 1 x Per Week/15 Days Discharge Instructions: Use triamcinolone 15 (g) as directed Peri-Wound Care: Zinc Oxide Ointment 30g tube 1 x Per Week/15 Days Discharge Instructions: Apply Zinc Oxide mixed with ketoconozole and TCA cream to irritated area posterior ankle Prim Dressing: KerraCel Ag  Gelling Fiber Dressing, 2x2 in (silver alginate) 1 x Per Week/15 Days ary Discharge Instructions: Apply silver alginate to wound bed as instructed Secondary Dressing: Woven Gauze Sponge, Non-Sterile 4x4 in 1 x Per Week/15 Days Discharge Instructions: Apply over primary dressing as directed. Secondary Dressing: ABD Pad, 5x9 1 x Per Week/15 Days Discharge Instructions: Apply over primary dressing in skin fold posterior ankle Com pression Wrap: Unnaboot w/Calamine, 4x10 (in/yd) 1 x Per Week/15 Days Discharge Instructions: Apply Unnaboot as directed. May also use Miliken CoFlex Calamine 2 layer compression system as alternative. 1. We are using Unna boots at the patient's request 2. Still silver alginate to the weeping area on the right posterior calf 3. Empiric Keflex for 7 days because of the erythema in the right leg although I think this is all due to excess fluid accumulation. I do not think she has an acute DVT 4. My plan is to get all the areas to heal and then to refer her back to the lymphedema clinic in [facility] for management of ongoing edema. We could get custom stockings but I think lymphedema wraps would probably be a better choice. 5. I have asked her to go back on her  compression pumps especially on the right leg but to be mindful about the possibility of coexistent infection Electronic Signature(s) Signed: 04/01/2021 4:24:10 PM By: [name] MD Entered By: [name] on 04/01/2021 16:24:10 -------------------------------------------------------------------------------- SuperBill Details Patient Name: Date of Service: [place], [name] 04/01/2021 Medical Record Number: [id number] Patient Account Number: [id number] Date of Birth/Sex: Treating RN: [birth year] (80 y.o. Lisa [name], Lauren Primary Care Provider: [name] Other Clinician: Referring Provider: Treating Provider/Extender: Heidi Dach in Treatment: 10 Diagnosis  Coding ICD-10 Codes Code Description I89.0 Lymphedema, not elsewhere classified [id number] Non-pressure chronic ulcer of right calf limited to breakdown of skin L97.828 Non-pressure chronic ulcer of other part of left lower leg with other specified severity L03.115 Cellulitis of right lower limb Facility Procedures CPT4 Code: [id number] Description: 29580 - APPLY UNNA BOOT/PROFO BILATERAL Modifier: Quantity: 1 Physician Procedures : CPT4 Code Description Modifier [id number] - WC PHYS LEVEL 4 - EST PT ICD-10 Diagnosis Description I89.0 Lymphedema, not elsewhere classified L97.211 Non-pressure chronic ulcer of right calf limited to breakdown of skin L03.115 Cellulitis of right  lower limb Quantity: 1 Electronic Signature(s) Signed: 04/01/2021 5:31:05 PM By: Rhae Hammock RN Signed: 04/02/2021 8:02:04 AM By: [name] MD Signed: 04/02/2021 8:02:04 AM By: [name] MD Entered By: Rhae Hammock on 04/01/2021 17:14:22

## 2021-04-04 ENCOUNTER — Encounter: Payer: Medicare Other | Admitting: Occupational Therapy

## 2021-04-07 ENCOUNTER — Encounter: Payer: Medicare Other | Admitting: Occupational Therapy

## 2021-04-07 ENCOUNTER — Ambulatory Visit (INDEPENDENT_AMBULATORY_CARE_PROVIDER_SITE_OTHER): Payer: Medicare Other

## 2021-04-07 DIAGNOSIS — Z95810 Presence of automatic (implantable) cardiac defibrillator: Secondary | ICD-10-CM

## 2021-04-07 DIAGNOSIS — I5022 Chronic systolic (congestive) heart failure: Secondary | ICD-10-CM | POA: Diagnosis not present

## 2021-04-08 ENCOUNTER — Encounter (HOSPITAL_BASED_OUTPATIENT_CLINIC_OR_DEPARTMENT_OTHER): Payer: Medicare Other | Admitting: Internal Medicine

## 2021-04-08 ENCOUNTER — Other Ambulatory Visit: Payer: Self-pay

## 2021-04-08 DIAGNOSIS — I890 Lymphedema, not elsewhere classified: Secondary | ICD-10-CM | POA: Diagnosis not present

## 2021-04-08 NOTE — Progress Notes (Signed)
ANUSHRI, RELPH ([id number]) Visit Report for 04/08/2021 Fall Risk Assessment Details Patient Name: Date of Service: Lisa [name], [name] 04/08/2021 3:45 PM Medical Record Number: [id number] Patient Account Number: [id number] Date of Birth/Sex: Treating RN: 11-20-1940 (80 y.o. [name] Primary Care Riggs Dineen: [name] Other Clinician: Referring Mayanna Garlitz: Treating Sueko Dimichele/Extender: Heidi Dach in Treatment: 11 Fall Risk Assessment Items Have you had 2 or more falls in the last 12 monthso 0 No Have you had any fall that resulted in injury in the last 12 monthso 0 No FALLS RISK SCREEN History of falling - immediate or within 3 months 25 Yes Secondary diagnosis (Do you have 2 or more medical diagnoseso) 0 No Ambulatory aid None/bed rest/wheelchair/nurse 0 No Crutches/cane/walker 15 Yes Furniture 0 No Intravenous therapy Access/Saline/Heparin Lock 0 No Gait/Transferring Normal/ bed rest/ wheelchair 0 Yes Weak (short steps with or without shuffle, stooped but able to lift head while walking, may seek 0 No support from furniture) Impaired (short steps with shuffle, may have difficulty arising from chair, head down, impaired 0 No balance) Mental Status Oriented to own ability 0 Yes Electronic Signature(s) Signed: 04/08/2021 5:23:58 PM By: Lorrin Jackson Entered By: Lorrin Jackson on 04/08/2021 15:22:38

## 2021-04-09 ENCOUNTER — Encounter: Payer: Medicare Other | Admitting: Occupational Therapy

## 2021-04-09 NOTE — Progress Notes (Signed)
MERRANDA, BOLLS ([id number]) Visit Report for 04/08/2021 Arrival Information Details Patient Name: Date of Service: Pennie Rushing, [name] 04/08/2021 3:45 PM Medical Record Number: [id number] Patient Account Number: [id number] Date of Birth/Sex: Treating RN: 30-Sep-1940 (80 y.o. [name] Primary Care Brodee Mauritz: [name] Other Clinician: Referring Filippo Puls: Treating Valente Fosberg/Extender: Heidi Dach in Treatment: 11 Visit Information History Since Last Visit Added or deleted any medications: No Patient Arrived: [name] Any new allergies or adverse reactions: No Arrival Time: 15:19 Had a fall or experienced change in Yes Transfer Assistance: None activities of daily living that may affect Patient Requires Transmission-Based Precautions: No risk of falls: Patient Has Alerts: Yes Signs or symptoms of abuse/neglect since last visito No Patient Alerts: Patient on Blood Thinner Hospitalized since last visit: No Defibrillator Implantable device outside of the clinic excluding No ABI=[place] Bilaterally cellular tissue based products placed in the center since last visit: Has Dressing in Place as Prescribed: Yes Has Compression in Place as Prescribed: Yes Pain Present Now: No Electronic Signature(s) Signed: 04/08/2021 5:23:58 PM By: Lorrin Jackson Entered By: Lorrin Jackson on 04/08/2021 15:22:12 -------------------------------------------------------------------------------- Compression Therapy Details Patient Name: Date of Service: Pennie Rushing, [name] 04/08/2021 3:45 PM Medical Record Number: [id number] Patient Account Number: [id number] Date of Birth/Sex: Treating RN: Apr 30, 1941 (80 y.o. [name] Primary Care Momo Braun: [name] Other Clinician: Referring Wane Mollett: Treating Bulah Lurie/Extender: Heidi Dach in Treatment: 11 Compression Therapy Performed for Wound Assessment: NonWound Condition Lymphedema  - Left Leg Performed By: Clinician Lorrin Jackson, RN Compression Type: Rolena Infante Post Procedure Diagnosis Same as Pre-procedure Electronic Signature(s) Signed: 04/08/2021 5:23:58 PM By: Lorrin Jackson Entered By: Lorrin Jackson on 04/08/2021 15:58:13 -------------------------------------------------------------------------------- Compression Therapy Details Patient Name: Date of Service: Pennie Rushing, [name] 04/08/2021 3:45 PM Medical Record Number: [id number] Patient Account Number: [id number] Date of Birth/Sex: Treating RN: April 27, 1941 (80 y.o. [name] Primary Care Nasreen Goedecke: [name] Other Clinician: Referring Alazia Crocket: Treating Lejend Dalby/Extender: Heidi Dach in Treatment: 11 Compression Therapy Performed for Wound Assessment: Non-Wound Location Performed By: Clinician Lorrin Jackson, RN Compression Type: Rolena Infante Location: Lower Extremity, Right Post Procedure Diagnosis Same as Pre-procedure Electronic Signature(s) Signed: 04/08/2021 5:23:58 PM By: Lorrin Jackson Entered By: Lorrin Jackson on 04/08/2021 15:59:05 -------------------------------------------------------------------------------- Encounter Discharge Information Details Patient Name: Date of Service: [name], [name] 04/08/2021 3:45 PM Medical Record Number: [id number] Patient Account Number: [id number] Date of Birth/Sex: Treating RN: October 03, 1940 (80 y.o. [name] Primary Care Dinesh Ulysse: [name] Other Clinician: Referring Brindy Higginbotham: Treating Fontaine Kossman/Extender: Heidi Dach in Treatment: 11 Encounter Discharge Information Items Discharge Condition: Stable Ambulatory Status: Cane Discharge Destination: Home Transportation: Private Auto Schedule Follow-up Appointment: Yes Clinical Summary of Care: Provided on 04/08/2021 Form Type Recipient Paper Patient Patient Electronic Signature(s) Signed: 04/08/2021 5:04:11 PM  By: Lorrin Jackson Entered By: Lorrin Jackson on 04/08/2021 17:04:10 -------------------------------------------------------------------------------- Lower Extremity Assessment Details Patient Name: Date of Service: [name], [name] 04/08/2021 3:45 PM Medical Record Number: [id number] Patient Account Number: [id number] Date of Birth/Sex: Treating RN: 1941/07/16 (80 y.o. [name] Primary Care Camela Wich: [name] Other Clinician: Referring Laureano Hetzer: Treating Markey Deady/Extender: Heidi Dach in Treatment: 11 Edema Assessment Assessed: [name]: Yes] [name]: Yes] Edema: [Left: Yes] [Right: Yes] Calf Left: Right: Point of Measurement: 30 cm From Medial Instep 66 cm 66 cm Ankle Left: Right: Point of Measurement: 6 cm From Medial Instep 33 cm 36  cm Vascular Assessment Pulses: Dorsalis Pedis Palpable: [Left:Yes] [Right:Yes] Electronic Signature(s) Signed: 04/08/2021 5:23:58 PM By: Lorrin Jackson Entered By: Lorrin Jackson on 04/08/2021 15:34:14 -------------------------------------------------------------------------------- Multi Wound Chart Details Patient Name: Date of Service: [name], [name] 04/08/2021 3:45 PM Medical Record Number: [id number] Patient Account Number: [id number] Date of Birth/Sex: Treating RN: 1940/12/20 (80 y.o. [name], Lauren Primary Care Surya Schroeter: [name] Other Clinician: Referring Karolyna Bianchini: Treating Denee Boeder/Extender: Heidi Dach in Treatment: 11 Vital Signs Height(in): 62 Pulse(bpm): 61 Weight(lbs): 754 Blood Pressure(mmHg): 160/81 Body Mass Index(BMI): 49 Temperature(F): 97.9 Respiratory Rate(breaths/min): 20 Photos: [N/A:N/A] Right, Posterior Lower Leg N/A N/A Wound Location: Gradually Appeared N/A N/A Wounding Event: Lymphedema N/A N/A Primary Etiology: Lymphedema, Arrhythmia, Congestive N/A N/A Comorbid History: Heart Failure, Coronary  Artery Disease, Hypertension, Myocardial Infarction, Gout, Osteoarthritis, Received Chemotherapy 09/30/2020 N/A N/A Date Acquired: 11 N/A N/A Weeks of Treatment: Healed - Epithelialized N/A N/A Wound Status: 0x0x0 N/A N/A Measurements L x W x D (cm) 0 N/A N/A A (cm) : rea 0 N/A N/A Volume (cm) : 100.00% N/A N/A % Reduction in Area: 100.00% N/A N/A % Reduction in Volume: Full Thickness Without Exposed N/A N/A Classification: Support Structures Treatment Notes Electronic Signature(s) Signed: 04/08/2021 5:24:19 PM By: Rhae Hammock RN Signed: 04/09/2021 7:53:49 AM By: [name] MD Entered By: [name] on 04/08/2021 16:07:59 -------------------------------------------------------------------------------- Multi-Disciplinary Care Plan Details Patient Name: Date of Service: Pennie Rushing, [name] 04/08/2021 3:45 PM Medical Record Number: [id number] Patient Account Number: [id number] Date of Birth/Sex: Treating RN: July 18, 1941 (80 y.o. [name] Primary Care Liam Cammarata: [name] Other Clinician: Referring Walfred Bettendorf: Treating Janina Trafton/Extender: Heidi Dach in Treatment: 11 Multidisciplinary Care Plan reviewed with physician Active Inactive Venous Leg Ulcer Nursing Diagnoses: Knowledge deficit related to disease process and management Potential for venous Insuffiency (use before diagnosis confirmed) Goals: Patient will maintain optimal edema control Date Initiated: 01/20/2021 Target Resolution Date: 04/13/2021 Goal Status: Active Interventions: Assess peripheral edema status every visit. Compression as ordered Treatment Activities: Therapeutic compression applied : 01/20/2021 Notes: 03/25/21: Edema improved but ongoing issue. Wound/Skin Impairment Nursing Diagnoses: Impaired tissue integrity Knowledge deficit related to ulceration/compromised skin integrity Goals: Patient/caregiver will verbalize understanding of  skin care regimen Date Initiated: 01/20/2021 Date Inactivated: 03/25/2021 Target Resolution Date: 03/21/2021 Goal Status: Met Ulcer/skin breakdown will have a volume reduction of 30% by week 4 Date Initiated: 01/20/2021 Date Inactivated: 02/21/2021 Target Resolution Date: 02/17/2021 Goal Status: Met Ulcer/skin breakdown will have a volume reduction of 50% by week 8 Date Initiated: 02/21/2021 Date Inactivated: 03/25/2021 Target Resolution Date: 03/21/2021 Goal Status: Met Ulcer/skin breakdown will have a volume reduction of 80% by week 12 Date Initiated: 03/25/2021 Target Resolution Date: 04/30/2021 Goal Status: Active Interventions: Assess patient/caregiver ability to obtain necessary supplies Assess patient/caregiver ability to perform ulcer/skin care regimen upon admission and as needed Assess ulceration(s) every visit Provide education on ulcer and skin care Treatment Activities: Skin care regimen initiated : 01/20/2021 Topical wound management initiated : 01/20/2021 Notes: Electronic Signature(s) Signed: 04/08/2021 5:23:58 PM By: Lorrin Jackson Entered By: Lorrin Jackson on 04/08/2021 15:47:32 -------------------------------------------------------------------------------- Pain Assessment Details Patient Name: Date of Service: Pennie Rushing, [name] 04/08/2021 3:45 PM Medical Record Number: [id number] Patient Account Number: [id number] Date of Birth/Sex: Treating RN: 24-Sep-1940 (80 y.o. [name] Primary Care Sitlali Koerner: [name] Other Clinician: Referring Maynard David: Treating Dhruti Ghuman/Extender: Heidi Dach in Treatment: 11 Active Problems Location of Pain Severity and Description of Pain Patient  Has Paino No Site Locations Pain Management and Medication Current Pain Management: Electronic Signature(s) Signed: 04/08/2021 5:23:58 PM By: Lorrin Jackson Entered By: Lorrin Jackson on 04/08/2021  15:22:48 -------------------------------------------------------------------------------- Patient/Caregiver Education Details Patient Name: Date of Service: [name], MA [name] 8/30/2022andnbsp3:45 PM Medical Record Number: [id number] Patient Account Number: [id number] Date of Birth/Gender: Treating RN: 1941/05/14 (80 y.o. [name] Primary Care Physician: [name] Other Clinician: Referring Physician: Treating Physician/Extender: Heidi Dach in Treatment: 11 Education Assessment Education Provided To: Patient Education Topics Provided Venous: Methods: Demonstration, Explain/Verbal, Printed Responses: State content correctly Wound/Skin Impairment: Methods: Demonstration, Explain/Verbal, Printed Responses: State content correctly Electronic Signature(s) Signed: 04/08/2021 5:23:58 PM By: Lorrin Jackson Entered By: Lorrin Jackson on 04/08/2021 15:47:56 -------------------------------------------------------------------------------- Wound Assessment Details Patient Name: Date of Service: Pennie Rushing, [name] 04/08/2021 3:45 PM Medical Record Number: [id number] Patient Account Number: [id number] Date of Birth/Sex: Treating RN: [birth year] (80 y.o. [name] Primary Care Jenisha Faison: [name] Other Clinician: Referring Jes Costales: Treating Tremaine Earwood/Extender: Heidi Dach in Treatment: 11 Wound Status Wound Number: 1 Primary Lymphedema Etiology: Wound Location: Right, Posterior Lower Leg Wound Healed - Epithelialized Wounding Event: Gradually Appeared Status: Date Acquired: 09/30/2020 Comorbid Lymphedema, Arrhythmia, Congestive Heart Failure, Coronary Weeks Of Treatment: 11 History: Artery Disease, Hypertension, Myocardial Infarction, Gout, Clustered Wound: No Osteoarthritis, Received Chemotherapy Photos Wound Measurements Length: (cm) Width: (cm) Depth: (cm) Area: (cm) Volume:  (cm) 0 % Reduction in Area: 100% 0 % Reduction in Volume: 100% 0 0 0 Wound Description Classification: Full Thickness Without Exposed Support Structur es Electronic Signature(s) Signed: 04/08/2021 5:23:58 PM By: Lorrin Jackson Entered By: Lorrin Jackson on 04/08/2021 15:46:29 -------------------------------------------------------------------------------- Vitals Details Patient Name: Date of Service: [name], [name] 04/08/2021 3:45 PM Medical Record Number: [id number] Patient Account Number: [id number] Date of Birth/Sex: Treating RN: 1941-06-28 (80 y.o. [name] Primary Care Justeen Hehr: [name] Other Clinician: Referring Markian Glockner: Treating Zykeriah Mathia/Extender: Heidi Dach in Treatment: 11 Vital Signs Time Taken: 15:24 Temperature (F): 97.9 Height (in): 62 Pulse (bpm): 61 Weight (lbs): 268 Respiratory Rate (breaths/min): 20 Body Mass Index (BMI): 49 Blood Pressure (mmHg): 160/81 Reference Range: 80 - 120 mg / dl Electronic Signature(s) Signed: 04/08/2021 5:23:58 PM By: Lorrin Jackson Entered By: Lorrin Jackson on 04/08/2021 15:24:53

## 2021-04-09 NOTE — Progress Notes (Signed)
CHANEE, SPORN ([id number]) Visit Report for 04/08/2021 HPI Details Patient Name: Date of Service: Lisa [name], Lisa [name] 04/08/2021 3:45 PM Medical Record Number: [id number] Patient Account Number: [id number] Date of Birth/Sex: Treating RN: July 22, 1941 (80 y.o. [name], Lauren Primary Care Provider: [name] Other Clinician: Referring Provider: Treating Provider/Extender: Heidi Dach in Treatment: 11 History of Present Illness HPI Description: ADMISSION 01/20/2021 This is a 80 year old woman who tells me that she has had problems with lymphedema since the 1970s. She does not have, or has not been given, a clear explanation for this. She has been followed for many years at various lymphedema clinics including in [place] and then in [place] and now sees Stephani Police at [place]. Developed a wound on the right posterior calf about a month ago. When that happened she is no longer able to have lymphedema therapy on this leg. She has been seeing her primary doctor who dressed this with DuoDERM and more recently with an Haematologist. The wounds were also debrided on the posterior The patient has 79 year old compression pumps but I am not sure of the frequency of their use she also has juxta lites but again I am not sure how frequently unreliably the patient is using them. She has open wounds on the right posterior and weeping de-epithelialized area predominantly on the left anterior and left anterior lateral. 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 tach and has a cardioverter defibrillator, gout and obstructive sleep apnea. She also has had a melanoma on the right leg and there is an extensive scar in the middle of lymphedema on the right anterior lower leg ABIs in our clinic were  noncompressible bilaterally 6/20; patient's wound on the right posterior calf is better. She brought in her compression pumps today and says that he still do not fit her leg. She also has external compression stockings at home but she did not bring those in today. We have her in 4-layer compression she is actually doing quite well much less swelling in the right leg She has old melanoma surgery on her right leg but she is quite adamant that she did not have any radiation of this was back in the 1970s 6/29; right posterior calf wound. This appears somewhat dry but looks as though it is attempting to epithelialize. We have better edema control. We have somebody coming out to go over her compression pumps with her on 7/12. 7/15; patient presents because she is having increased irritation to her ankles. She has reported more weeping than usual to her lower extremities. She would like to switch from the Coflex used at last clinic visit to a 4-layer compression wrap. She also states she does not want to use Hydrofera Blue. Her compression pumps are coming in on Wednesday. 7/19; I have not seen this patient in quite a while. At that point we are dealing with an area on the right posterior calf in the setting of severe bilateral lymphedema. I do not remember they are actually being a problem on the left leg. However Dr. Heber [place] reports significant erythema and weeping on the left lower leg last week. We initially were using Coflex and change this to 4-layer compression last week although this is not really helped. She comes in today with a lot of discomfort around the left ankle and marked weeping edema. Complicating  things she is traveling to [facility] leaving on Sunday. She says that some members of her family or physicians and presumably they might be able to help with an Ace wrap beyond that I do not know what were going to do here. She has made arrangements for a visitation for her compression  pumps this Thursday. 8/1; patient presents for 2-week follow-up. She has been using Unna boots with silver alginate underneath. She reports improvement in her wounds. She overall reports feeling well. She has no issues or complaints today. She denies signs of infection. 8/16; patient comes back in follow-up. Since last time I saw her things are actually going quite a bit better. She has a weeping area on the right lower ankle pantaloon deformity posteriorly and probably an area just above the ankle anteriorly on the left as well. She has compression pumps which came last week however she was ill with COVID and did not get started on them. She is familiar with the lymphedema clinic in [place] and I think we are going to have to refer her back there. 8/23; she arrives in clinic today with erythema over the majority of her right lower leg. She says that over Saturday night she felt a sudden pain in her right leg rolled over and feels that drainage got stuck on the bed and it pulled her skin off. Since then she has not been using the compression pumps. She arrives in clinic today with the discoloration I which I think is probably stasis dermatitis rather than cellulitis. She is not systemically unwell. The plan here is to get her areas to close and then to refer her back to the lymphedema clinic. I doubt we could order her standard compression stockings. She has her upgraded compression pumps but for 1 reason or another we have not been able to get these on for a week reliably. I think she still has an area on the right posterior calf that is weeping and open although certainly close to closing. We are using silver alginate under Unna boot compression 8/30; she says she has been dealing with the supplier of her new compression pumps finding the pumps that they brought far too tight and not is evenly dispensed in terms of compression. I am not totally sure what the issue was however there is apparently  new pumps on the way. Fortunately her wound areas are all essentially resolved. She has some weeping spots but I do not expect this to totally resolve Electronic Signature(s) Signed: 04/09/2021 7:53:49 AM By: [name] MD Entered By: [name] on 04/08/2021 16:08:58 -------------------------------------------------------------------------------- Physical Exam Details Patient Name: Date of Service: [name], [name] 04/08/2021 3:45 PM Medical Record Number: [id number] Patient Account Number: [id number] Date of Birth/Sex: Treating RN: 1940-10-15 (80 y.o. [name] Primary Care Provider: [name] Other Clinician: Referring Provider: Treating Provider/Extender: Heidi Dach in Treatment: 11 Constitutional Patient is hypertensive.. Pulse regular and within target range for patient.[name] Respirations regular, non-labored and within target range.. Temperature is normal and within the target range for the patient.[name] Appears in no distress. Cardiovascular Pedal pulses are palpable. Lymphedema but much better than last week. Erythema on the right leg also better. Notes Wound exam; right lower extremity pantaloon deformity. Superficial small area posteriorly although it is epithelialized and not nearly as weeping as last week. The left leg also was improved. The pantaloon areas around her ankles are always going to be problematic in terms of the  condition of the skin under the folds of these areas but I see no evidence of infection in either leg no need for additional antibiotics Electronic Signature(s) Signed: 04/09/2021 7:53:49 AM By: [name] MD Entered By: [name] on 04/08/2021 16:10:25 -------------------------------------------------------------------------------- Physician Orders Details Patient Name: Date of Service: [name], [name] 04/08/2021 3:45 PM Medical Record Number: [id number] Patient Account Number:  [id number] Date of Birth/Sex: Treating RN: [birth year] (80 y.o. [name] Primary Care Provider: [name] Other Clinician: Referring Provider: Treating Provider/Extender: Heidi Dach in Treatment: 11 Verbal / Phone Orders: No Diagnosis Coding ICD-10 Coding Code Description I89.0 Lymphedema, not elsewhere classified L97.211 Non-pressure chronic ulcer of right calf limited to breakdown of skin L97.828 Non-pressure chronic ulcer of other part of left lower leg with other specified severity L03.115 Cellulitis of right lower limb Follow-up Appointments ppointment in 1 week. - Dr. Dellia Nims Return A Other: - Will make referral for patient to go back to Lymphedema Clinic in [place] Bathing/ Shower/ Hygiene May shower with protection but do not get wound dressing(s) wet. Edema Control - Lymphedema / SCD / Other Bilateral Lower Extremities Lymphedema Pumps. Use Lymphedema pumps on leg(s) 2-3 times a day for 45-60 minutes. If wearing any wraps or hose, do not remove them. Continue exercising as instructed. - use over compression wraps: Patient still waiting on pumps Elevate legs to the level of the heart or above for 30 minutes daily and/or when sitting, a frequency of: - throughout the day Avoid standing for long periods of time. Exercise regularly Non Wound Condition Bilateral Lower Extremities pply the following to affected area as directed: - Apply silver alginate to any weeping areas, cover with ABDs and Unna Boot to bilateral legs. Use A TCA/Zinc Cream and ABD pad in fold of posterior ankles. Electronic Signature(s) Signed: 04/08/2021 5:23:58 PM By: Lorrin Jackson Signed: 04/09/2021 7:53:49 AM By: [name] MD Entered By: Lorrin Jackson on 04/08/2021 16:03:20 -------------------------------------------------------------------------------- Problem List Details Patient Name: Date of Service: [name], [name] 04/08/2021  3:45 PM Medical Record Number: [id number] Patient Account Number: [id number] Date of Birth/Sex: Treating RN: Sep 01, 1940 (80 y.o. [name] Primary Care Provider: [name] Other Clinician: Referring Provider: Treating Provider/Extender: Heidi Dach in Treatment: 11 Active Problems ICD-10 Encounter Code Description Active Date MDM Diagnosis I89.0 Lymphedema, not elsewhere classified 01/20/2021 No Yes L97.211 Non-pressure chronic ulcer of right calf limited to breakdown of skin 01/20/2021 No Yes L97.828 Non-pressure chronic ulcer of other part of left lower leg with other specified 01/20/2021 No Yes severity L03.115 Cellulitis of right lower limb 04/01/2021 No Yes Inactive Problems ICD-10 Code Description Active Date Inactive Date L03.116 Cellulitis of left lower limb 02/25/2021 02/25/2021 Resolved Problems Electronic Signature(s) Signed: 04/09/2021 7:53:49 AM By: [name] MD Entered By: [name] on 04/08/2021 16:07:53 -------------------------------------------------------------------------------- Progress Note Details Patient Name: Date of Service: [place], [name] 04/08/2021 3:45 PM Medical Record Number: [id number] Patient Account Number: [id number] Date of Birth/Sex: Treating RN: 1941/06/26 (80 y.o. [name], Lauren Primary Care Provider: [name] Other Clinician: Referring Provider: Treating Provider/Extender: Heidi Dach in Treatment: 11 Subjective History of Present Illness (HPI) ADMISSION 01/20/2021 This is a 80 year old woman who tells me that she has had problems with lymphedema since the 1970s. She does not have, or has not been given, a clear explanation for this. She has been followed for many years at various lymphedema clinics including in  [place] and then in [place] and now sees Stephani Police at [place]. Developed a wound on the right posterior calf about a  month ago. When that happened she is no longer able to have lymphedema therapy on this leg. She has been seeing her primary doctor who dressed this with DuoDERM and more recently with an Haematologist. The wounds were also debrided on the posterior The patient has 79 year old compression pumps but I am not sure of the frequency of their use she also has juxta lites but again I am not sure how frequently unreliably the patient is using them. She has open wounds on the right posterior and weeping de-epithelialized area predominantly on the left anterior and left anterior lateral. 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 tach and has a cardioverter defibrillator, gout and obstructive sleep apnea. She also has had a melanoma on the right leg and there is an extensive scar in the middle of lymphedema on the right anterior lower leg ABIs in our clinic were noncompressible bilaterally 6/20; patient's wound on the right posterior calf is better. She brought in her compression pumps today and says that he still do not fit her leg. She also has external compression stockings at home but she did not bring those in today. We have her in 4-layer compression she is actually doing quite well much less swelling in the right leg She has old melanoma surgery on her right leg but she is quite adamant that she did not have any radiation of this was back in the 1970s 6/29; right posterior calf wound. This appears somewhat dry but looks as though it is attempting to epithelialize. We have better edema control. We have somebody coming out to go over her compression pumps with her on 7/12. 7/15; patient presents because she is having increased irritation to her ankles. She has reported more weeping than usual to her lower extremities. She would like to switch from  the Coflex used at last clinic visit to a 4-layer compression wrap. She also states she does not want to use Hydrofera Blue. Her compression pumps are coming in on Wednesday. 7/19; I have not seen this patient in quite a while. At that point we are dealing with an area on the right posterior calf in the setting of severe bilateral lymphedema. I do not remember they are actually being a problem on the left leg. However Dr. Heber [place] reports significant erythema and weeping on the left lower leg last week. We initially were using Coflex and change this to 4-layer compression last week although this is not really helped. She comes in today with a lot of discomfort around the left ankle and marked weeping edema. Complicating things she is traveling to [facility] leaving on Sunday. She says that some members of her family or physicians and presumably they might be able to help with an Ace wrap beyond that I do not know what were going to do here. She has made arrangements for a visitation for her compression pumps this Thursday. 8/1; patient presents for 2-week follow-up. She has been using Unna boots with silver alginate underneath. She reports improvement in her wounds. She overall reports feeling well. She has no issues or complaints today. She denies signs of infection. 8/16; patient comes back in follow-up. Since last time I saw her things are actually going quite a  bit better. She has a weeping area on the right lower ankle pantaloon deformity posteriorly and probably an area just above the ankle anteriorly on the left as well. She has compression pumps which came last week however she was ill with COVID and did not get started on them. She is familiar with the lymphedema clinic in [place] and I think we are going to have to refer her back there. 8/23; she arrives in clinic today with erythema over the majority of her right lower leg. She says that over Saturday night she felt a sudden pain in her  right leg rolled over and feels that drainage got stuck on the bed and it pulled her skin off. Since then she has not been using the compression pumps. She arrives in clinic today with the discoloration I which I think is probably stasis dermatitis rather than cellulitis. She is not systemically unwell. The plan here is to get her areas to close and then to refer her back to the lymphedema clinic. I doubt we could order her standard compression stockings. She has her upgraded compression pumps but for 1 reason or another we have not been able to get these on for a week reliably. I think she still has an area on the right posterior calf that is weeping and open although certainly close to closing. We are using silver alginate under Unna boot compression 8/30; she says she has been dealing with the supplier of her new compression pumps finding the pumps that they brought far too tight and not is evenly dispensed in terms of compression. I am not totally sure what the issue was however there is apparently new pumps on the way. Fortunately her wound areas are all essentially resolved. She has some weeping spots but I do not expect this to totally resolve Objective Constitutional Patient is hypertensive.. Pulse regular and within target range for patient.[name] Respirations regular, non-labored and within target range.. Temperature is normal and within the target range for the patient.[name] Appears in no distress. Vitals Time Taken: 3:24 PM, Height: 62 in, Weight: 268 lbs, BMI: 49, Temperature: 97.9 F, Pulse: 61 bpm, Respiratory Rate: 20 breaths/min, Blood Pressure: 160/81 mmHg. Cardiovascular Pedal pulses are palpable. Lymphedema but much better than last week. Erythema on the right leg also better. General Notes: Wound exam; right lower extremity pantaloon deformity. Superficial small area posteriorly although it is epithelialized and not nearly as weeping as last week. The left leg also was improved. The  pantaloon areas around her ankles are always going to be problematic in terms of the condition of the skin under the folds of these areas but I see no evidence of infection in either leg no need for additional antibiotics Integumentary (Hair, Skin) Wound #1 status is Healed - Epithelialized. Original cause of wound was Gradually Appeared. The date acquired was: 09/30/2020. The wound has been in treatment 11 weeks. The wound is located on the Right,Posterior Lower Leg. The wound measures 0cm length x 0cm width x 0cm depth; 0cm^2 area and 0cm^3 volume. 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 Post procedure Diagnosis Wound #: Same as Pre-Procedure There was a Haematologist Compression Therapy Procedure by Lorrin Jackson, RN. Post procedure Diagnosis Wound #: Same as  Pre-Procedure Plan Follow-up Appointments: Return Appointment in 1 week. - Dr. Dellia Nims Other: - Will make referral for patient to go back to Lymphedema Clinic in [place] Bathing/ Shower/ Hygiene: May shower with protection but do not get wound dressing(s) wet. Edema Control - Lymphedema / SCD / Other: Lymphedema Pumps. Use Lymphedema pumps on leg(s) 2-3 times a day for 45-60 minutes. If wearing any wraps or hose, do not remove them. Continue exercising as instructed. - use over compression wraps: Patient still waiting on pumps Elevate legs to the level of the heart or above for 30 minutes daily and/or when sitting, a frequency of: - throughout the day Avoid standing for long periods of time. Exercise regularly Non Wound Condition: Apply the following to affected area as directed: - Apply silver alginate to any  weeping areas, cover with ABDs and Unna Boot to bilateral legs. Use TCA/Zinc Cream and ABD pad in fold of posterior ankles. 1. We put her in bilateral Unna boots 2. I am going to refer her to the back to the lymphedema clinic in [place]. I think this lady will need on going maintenance. 3. The fact that she has small weeping areas occasionally I do not know that were going to be able to get away from this certainly not without the compression pumps. 4. I am hopeful that the compression pumps were actually too tight because it is clear that compression pumps are meant to be tight. 5. The degree of cellulitis that we had last week is resolved no further antibiotics are necessary Electronic Signature(s) Signed: 04/09/2021 7:53:49 AM By: [name] MD Entered By: [name] on 04/08/2021 16:14:20 -------------------------------------------------------------------------------- SuperBill Details Patient Name: Date of Service: [name], [name] 04/08/2021 Medical Record Number: [id number] Patient Account Number: [id number] Date of Birth/Sex: Treating RN: 09-Jan-1941 (80 y.o. [name] Primary Care Provider: [name] Other Clinician: Referring Provider: Treating Provider/Extender: Heidi Dach in Treatment: 11 Diagnosis Coding ICD-10 Codes Code Description I89.0 Lymphedema, not elsewhere classified [id number] Non-pressure chronic ulcer of right calf limited to breakdown of skin L97.828 Non-pressure chronic ulcer of other part of left lower leg with other specified severity L03.115 Cellulitis of right lower limb Facility Procedures CPT4 Code: [id number] Description: 29580 - APPLY UNNA BOOT/PROFO BILATERAL ICD-10 Diagnosis Description I89.0 Lymphedema, not elsewhere classified Modifier: Quantity: 1 Physician Procedures : CPT4 Code Description Modifier [id number] - WC PHYS LEVEL 3 - EST PT ICD-10 Diagnosis Description I89.0 Lymphedema,  not elsewhere classified L97.211 Non-pressure chronic ulcer of right calf limited to breakdown of skin L97.828 Non-pressure chronic  ulcer of other part of left lower leg with other specified severity Quantity: 1 : [id number] - WC PHYS TX BIL UNNA BOOT APPL ICD-10 Diagnosis Description I89.0 Lymphedema, not elsewhere classified Quantity: 1 Electronic Signature(s) Signed: 04/09/2021 7:53:49 AM By: [name] MD Entered By: [name] on 04/08/2021 16:14:41

## 2021-04-09 NOTE — Progress Notes (Signed)
EPIC Encounter for ICM Monitoring  Patient Name: Lisa [name] is a 80 y.o. female Date: 04/09/2021 Primary Care Physican: [name], MD Primary Cardiologist: Harrington Challenger Electrophysiologist: Santina Evans Pacing: 98.2%         03/25/2021 Office Weight: 260 lbs                                                            Spoke with patient and heart failure questions reviewed.  Pt asymptomatic for fluid accumulation and feeling well.   Optivol thoracic impedance trending close to baseline but was suggesting possible fluid accumulation from 8/5-8/23 and 8/25.   Prescribed:  Torsemide Take 20 mg by mouth as directed. two days on one day off with potassium Potassium 10 mEq Take 10 mEq by mouth every other day. Per patient taking 2 tablets of 10 mEq to equal 20 mEq.   Labs: 04/04/2021 Creatinine 1.41, BUN 20, Potassium 4.1, Sodium 139, GFR 38 01/02/2021 Creatinine 1.44, BUN 20, Potassium 3.9, Sodium 142, GFR 37 11/16/2020 Creatinine 1.56, BUN 20, Potassium 4.6, Sodium 140, GFR 34 09/13/2020 Creatinine 1.32, BUN 21, Potassium 3.8, Sodium 141, GFR 41 A complete set of results can be found in Results Review.   Recommendations:  No changes and encouraged to call if experiencing any fluid symptoms.   Follow-up plan: ICM clinic phone appointment on 05/19/2021.   91 day device clinic remote transmission 04/30/2021.     EP/Cardiology Office Visits:  Recall 06/07/2021 with Dr. Harrington Challenger.  Recall 03/21/2022 with Dr Lovena Le.   Copy of ICM check sent to Dr. Lovena Le.    3 month ICM trend: 04/07/2021.    1 Year ICM trend:       [name], RN 04/09/2021 9:08 AM

## 2021-04-11 ENCOUNTER — Encounter: Payer: Medicare Other | Admitting: Occupational Therapy

## 2021-04-15 ENCOUNTER — Other Ambulatory Visit: Payer: Self-pay

## 2021-04-15 ENCOUNTER — Encounter (HOSPITAL_BASED_OUTPATIENT_CLINIC_OR_DEPARTMENT_OTHER): Payer: Medicare Other | Attending: Internal Medicine | Admitting: Internal Medicine

## 2021-04-15 NOTE — Progress Notes (Addendum)
RAGINE, BALDER ([id number]) Visit Report for 04/15/2021 Arrival Information Details Patient Name: Date of Service: [place], [place]. 04/15/2021 2:00 PM Medical Record Number: [id number] Patient Account Number: [id number] Date of Birth/Sex: Treating RN: 08/01/1941 (80 y.o. [name] Primary Care Aleksandr Pellow: [name] Other Clinician: Referring Shareese Macha: Treating Jahrel Borthwick/Extender: Heidi Dach in Treatment: 12 Visit Information History Since Last Visit Added or deleted any medications: No Patient Arrived: Ambulatory Any new allergies or adverse reactions: No Arrival Time: 15:15 Had a fall or experienced change in No Accompanied By: alone activities of daily living that may affect Transfer Assistance: None risk of falls: Patient Identification Verified: Yes Signs or symptoms of abuse/neglect since last visito No Secondary Verification Process Completed: Yes Hospitalized since last visit: No Patient Requires Transmission-Based Precautions: No Implantable device outside of the clinic excluding No Patient Has Alerts: Yes cellular tissue based products placed in the center Patient Alerts: Patient on Blood Thinner since last visit: Defibrillator Has Dressing in Place as Prescribed: Yes ABI=[place] Bilaterally Has Compression in Place as Prescribed: Yes Pain Present Now: No Electronic Signature(s) Signed: 04/15/2021 5:42:35 PM By: Levan Hurst RN, BSN Entered By: Levan Hurst on 04/15/2021 15:32:42 -------------------------------------------------------------------------------- Lower Extremity Assessment Details Patient Name: Date of Service: [place], [place]. 04/15/2021 2:00 PM Medical Record Number: [id number] Patient Account Number: [id number] Date of Birth/Sex: Treating RN: 10/12/1940 (80 y.o. [name] Primary Care Dayla Gasca: [name] Other Clinician: Referring Talmage Teaster: Treating Susanne Baumgarner/Extender: Heidi Dach in Treatment: 12 Edema Assessment Assessed: [name]: No] [name]: No] Edema: [Left: Yes] [Right: Yes] Calf Left: Right: Point of Measurement: 30 cm From Medial Instep 65 cm 70 cm Ankle Left: Right: Point of Measurement: 6 cm From Medial Instep 33 cm 36.5 cm Vascular Assessment Pulses: Dorsalis Pedis Palpable: [Left:Yes] [Right:Yes] Electronic Signature(s) Signed: 04/15/2021 5:42:35 PM By: Levan Hurst RN, BSN Entered By: Levan Hurst on 04/15/2021 15:33:29 -------------------------------------------------------------------------------- Multi Wound Chart Details Patient Name: Date of Service: [name] 04/15/2021 2:00 PM Medical Record Number: [id number] Patient Account Number: [id number] Date of Birth/Sex: Treating RN: 12-04-1940 (80 y.o. [name], Lauren Primary Care Caydyn Sprung: [name] Other Clinician: Referring Shondrika Hoque: Treating Rizwan Kuyper/Extender: Heidi Dach in Treatment: 12 Vital Signs Height(in): 62 Pulse(bpm): 61 Weight(lbs): [id number] Blood Pressure(mmHg): 135/67 Body Mass Index(BMI): 49 Temperature(F): 97.6 Respiratory Rate(breaths/min): 18 Wound Assessments Treatment Notes Electronic Signature(s) Signed: 04/15/2021 5:13:37 PM By: [name] MD Signed: 04/15/2021 5:22:11 PM By: Rhae Hammock RN Entered By: [name] on 04/15/2021 16:21:07 -------------------------------------------------------------------------------- Multi-Disciplinary Care Plan Details Patient Name: Date of Service: [name] 04/15/2021 2:00 PM Medical Record Number: [id number] Patient Account Number: [id number] Date of Birth/Sex: Treating RN: August 31, 1940 (80 y.o. [name] Primary Care Irene Collings: [name] Other Clinician: Referring Alauna Hayden: Treating Humzah Harty/Extender: Heidi Dach in Treatment: [place] reviewed with  physician Active Inactive Electronic Signature(s) Signed: 05/08/2021 4:49:48 PM By: Levan Hurst RN, BSN Entered By: Levan Hurst on 05/08/2021 15:21:15 -------------------------------------------------------------------------------- Pain Assessment Details Patient Name: Date of Service: [name] 04/15/2021 2:00 PM Medical Record Number: [id number] Patient Account Number: [id number] Date of Birth/Sex: Treating RN: 11/07/1940 (80 y.o. [name] Primary Care Maximilliano Kersh: [name] Other Clinician: Referring Nyaisha Simao: Treating Doren Kaspar/Extender: Heidi Dach in Treatment: 12 Active Problems Location of Pain Severity and Description of Pain Patient Has Paino No Site Locations Pain Management and Medication  Current Pain Management: Electronic Signature(s) Signed: 04/15/2021 5:42:35 PM By: Levan Hurst RN, BSN Entered By: Levan Hurst on 04/15/2021 15:33:08 -------------------------------------------------------------------------------- Dar[place] Details Patient Name: Date of Service: [place], [place]. 04/15/2021 2:00 PM Medical Record Number: [id number] Patient Account Number: [id number] Date of Birth/Sex: Treating RN: [birth year] (80 y.o. [name] Primary Care Dolphus Linch: [name] Other Clinician: Referring Shalaya Swailes: Treating Makynzi Eastland/Extender: Heidi Dach in Treatment: 12 Vital Signs Time Taken: 15:15 Temperature (F): 97.6 Height (in): 62 Pulse (bpm): 61 Weight (lbs): 268 Respiratory Rate (breaths/min): 18 Body Mass Index (BMI): 49 Blood Pressure (mmHg): 135/67 Reference Range: 80 - 120 mg / dl Electronic Signature(s) Signed: 04/15/2021 5:42:35 PM By: Levan Hurst RN, BSN Entered By: Levan Hurst on 04/15/2021 15:33:02

## 2021-04-15 NOTE — Progress Notes (Signed)
Lisa [name], Lisa [name] ([id number]) Visit Report for 04/15/2021 HPI Details Patient Name: Date of Service: [place], [name] 04/15/2021 2:00 PM Medical Record Number: [id number] Patient Account Number: [id number] Date of Birth/Sex: Treating RN: [birth year] (80 y.o. [name], Lauren Primary Care Provider: [name] Other Clinician: Referring Provider: Treating Provider/Extender: Heidi Dach in Treatment: 12 History of Present Illness HPI Description: ADMISSION 01/20/2021 This is a 80 year old woman who tells me that she has had problems with lymphedema since the 1970s. She does not have, or has not been given, a clear explanation for this. She has been followed for many years at various lymphedema clinics including in [place] and then in [place] and now sees Stephani Police at [place]. Developed a wound on the right posterior calf about a month ago. When that happened she is no longer able to have lymphedema therapy on this leg. She has been seeing her primary doctor who dressed this with DuoDERM and more recently with an Haematologist. The wounds were also debrided on the posterior The patient has 79 year old compression pumps but I am not sure of the frequency of their use she also has juxta lites but again I am not sure how frequently unreliably the patient is using them. She has open wounds on the right posterior and weeping de-epithelialized area predominantly on the left anterior and left anterior lateral. 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 tach and has a cardioverter defibrillator, gout and obstructive sleep apnea. She also has had a melanoma on the right leg and there is an extensive scar in the middle of lymphedema on the right anterior lower leg ABIs in our clinic were  noncompressible bilaterally 6/20; patient's wound on the right posterior calf is better. She brought in her compression pumps today and says that he still do not fit her leg. She also has external compression stockings at home but she did not bring those in today. We have her in 4-layer compression she is actually doing quite well much less swelling in the right leg She has old melanoma surgery on her right leg but she is quite adamant that she did not have any radiation of this was back in the 1970s 6/29; right posterior calf wound. This appears somewhat dry but looks as though it is attempting to epithelialize. We have better edema control. We have somebody coming out to go over her compression pumps with her on 7/12. 7/15; patient presents because she is having increased irritation to her ankles. She has reported more weeping than usual to her lower extremities. She would like to switch from the Coflex used at last clinic visit to a 4-layer compression wrap. She also states she does not want to use Hydrofera Blue. Her compression pumps are coming in on Wednesday. 7/19; I have not seen this patient in quite a while. At that point we are dealing with an area on the right posterior calf in the setting of severe bilateral lymphedema. I do not remember they are actually being a problem on the left leg. However Dr. Heber Maywood reports significant erythema and weeping on the left lower leg last week. We initially were using Coflex and change this to 4-layer compression last week although this is not really helped. She comes in today with a lot of discomfort around the left ankle and marked weeping edema. Complicating  things she is traveling to [facility] leaving on Sunday. She says that some members of her family or physicians and presumably they might be able to help with an Ace wrap beyond that I do not know what were going to do here. She has made arrangements for a visitation for her compression  pumps this Thursday. 8/1; patient presents for 2-week follow-up. She has been using Unna boots with silver alginate underneath. She reports improvement in her wounds. She overall reports feeling well. She has no issues or complaints today. She denies signs of infection. 8/16; patient comes back in follow-up. Since last time I saw her things are actually going quite a bit better. She has a weeping area on the right lower ankle pantaloon deformity posteriorly and probably an area just above the ankle anteriorly on the left as well. She has compression pumps which came last week however she was ill with COVID and did not get started on them. She is familiar with the lymphedema clinic in [place] and I think we are going to have to refer her back there. 8/23; she arrives in clinic today with erythema over the majority of her right lower leg. She says that over Saturday night she felt a sudden pain in her right leg rolled over and feels that drainage got stuck on the bed and it pulled her skin off. Since then she has not been using the compression pumps. She arrives in clinic today with the discoloration I which I think is probably stasis dermatitis rather than cellulitis. She is not systemically unwell. The plan here is to get her areas to close and then to refer her back to the lymphedema clinic. I doubt we could order her standard compression stockings. She has her upgraded compression pumps but for 1 reason or another we have not been able to get these on for a week reliably. I think she still has an area on the right posterior calf that is weeping and open although certainly close to closing. We are using silver alginate under Unna boot compression 8/30; she says she has been dealing with the supplier of her new compression pumps finding the pumps that they brought far too tight and not is evenly dispensed in terms of compression. I am not totally sure what the issue was however there is apparently  new pumps on the way. Fortunately her wound areas are all essentially resolved. She has some weeping spots but I do not expect this to totally resolve 9/6; she has her compression pumps but has not started to use them. We have been using Unna boots and really she has only 1 area of any concern which is on the right posterior calf just above the bottom of the pantaloon deformity. This is not really a wound however there is continued leaking edema in this area. I have asked her to start using her compression pumps twice a day for 1 hour Electronic Signature(s) Signed: 04/15/2021 5:13:37 PM By: [name] MD Entered By: [name] on 04/15/2021 16:22:22 -------------------------------------------------------------------------------- Physical Exam Details Patient Name: Date of Service: [place], Ethan. 04/15/2021 2:00 PM Medical Record Number: [id number] Patient Account Number: [id number] Date of Birth/Sex: Treating RN: 1941-03-17 (80 y.o. [name] Primary Care Provider: [name] Other Clinician: Referring Provider: Treating Provider/Extender: Heidi Dach in Treatment: 12 Constitutional Sitting or standing Blood Pressure is within target range for patient.. Pulse regular and within target range for patient.[name] Respirations regular, non-labored and  within target range.. Temperature is normal and within the target range for the patient.[name] Appears in no distress. Integumentary (Hair, Skin) She has skin deformities in her bilateral lower legs related to lymphedema. Most of these are nodules and stasis dermatitis. However on the right medial lower leg there is a flat verricused area. Notes Wound exam; right lower extremity pantaloon deformity. Still a small draining area. Somewhat reddened around this but no evidence of cellulitis. Overall the edema in her bilateral lower legs is a lot better. Electronic Signature(s) Signed: 04/15/2021 5:13:37  PM By: [name] MD Entered By: [name] on 04/15/2021 16:26:11 -------------------------------------------------------------------------------- Physician Orders Details Patient Name: Date of Service: [place], [place]. 04/15/2021 2:00 PM Medical Record Number: [id number] Patient Account Number: [id number] Date of Birth/Sex: Treating RN: 1941/06/24 (80 y.o. [name] Primary Care Provider: [name] Other Clinician: Referring Provider: Treating Provider/Extender: Heidi Dach in Treatment: 12 Verbal / Phone Orders: No Diagnosis Coding ICD-10 Coding Code Description I89.0 Lymphedema, not elsewhere classified L97.211 Non-pressure chronic ulcer of right calf limited to breakdown of skin L97.828 Non-pressure chronic ulcer of other part of left lower leg with other specified severity L03.115 Cellulitis of right lower limb Follow-up Appointments ppointment in 1 week. - Dr. Dellia Nims Return A Other: - Call to schedule appointment to get re-established at [facility] in [place] Bathing/ Shower/ Hygiene May shower with protection but do not get wound dressing(s) wet. Edema Control - Lymphedema / SCD / Other Bilateral Lower Extremities Lymphedema Pumps. Use Lymphedema pumps on leg(s) 2-3 times a day for 45-60 minutes. If wearing any wraps or hose, do not remove them. Continue exercising as instructed. - use over compression wraps Elevate legs to the level of the heart or above for 30 minutes daily and/or when sitting, a frequency of: - throughout the day Avoid standing for long periods of time. Exercise regularly Non Wound Condition Bilateral Lower Extremities pply the following to affected area as directed: - Apply silver alginate to any weeping areas, cover with ABDs and Unna Boot to bilateral legs. Use A TCA/Zinc Cream and ABD pad in fold of posterior ankles. Electronic Signature(s) Signed: 04/15/2021 5:13:37 PM  By: [name] MD Signed: 04/15/2021 5:42:35 PM By: Levan Hurst RN, BSN Entered By: Levan Hurst on 04/15/2021 15:46:37 -------------------------------------------------------------------------------- Problem List Details Patient Name: Date of Service: [place], [name] 04/15/2021 2:00 PM Medical Record Number: [id number] Patient Account Number: [id number] Date of Birth/Sex: Treating RN: Dec 10, 1940 (80 y.o. [name] Primary Care Provider: [name] Other Clinician: Referring Provider: Treating Provider/Extender: Heidi Dach in Treatment: 12 Active Problems ICD-10 Encounter Code Description Active Date MDM Diagnosis I89.0 Lymphedema, not elsewhere classified 01/20/2021 No Yes L97.211 Non-pressure chronic ulcer of right calf limited to breakdown of skin 01/20/2021 No Yes L97.828 Non-pressure chronic ulcer of other part of left lower leg with other specified 01/20/2021 No Yes severity L03.115 Cellulitis of right lower limb 04/01/2021 No Yes Inactive Problems ICD-10 Code Description Active Date Inactive Date L03.116 Cellulitis of left lower limb 02/25/2021 02/25/2021 Resolved Problems Electronic Signature(s) Signed: 04/15/2021 5:13:37 PM By: [name] MD Entered By: [name] on 04/15/2021 16:21:01 -------------------------------------------------------------------------------- Progress Note Details Patient Name: Date of Service: [place], [name] 04/15/2021 2:00 PM Medical Record Number: [id number] Patient Account Number: [id number] Date of Birth/Sex: Treating RN: 08/04/1941 (80 y.o. [name], Lauren Primary Care Provider: [name] Other Clinician: Referring Provider: Treating Provider/Extender: [name]  Cassidy-Vu, Mart Piggs in Treatment: 12 Subjective History of Present Illness (HPI) ADMISSION 01/20/2021 This is a 80 year old woman who tells me that she has had problems with lymphedema since  the 1970s. She does not have, or has not been given, a clear explanation for this. She has been followed for many years at various lymphedema clinics including in [place] and then in [place] and now sees Stephani Police at [place]. Developed a wound on the right posterior calf about a month ago. When that happened she is no longer able to have lymphedema therapy on this leg. She has been seeing her primary doctor who dressed this with DuoDERM and more recently with an Haematologist. The wounds were also debrided on the posterior The patient has 79 year old compression pumps but I am not sure of the frequency of their use she also has juxta lites but again I am not sure how frequently unreliably the patient is using them. She has open wounds on the right posterior and weeping de-epithelialized area predominantly on the left anterior and left anterior lateral. 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 tach and has a cardioverter defibrillator, gout and obstructive sleep apnea. She also has had a melanoma on the right leg and there is an extensive scar in the middle of lymphedema on the right anterior lower leg ABIs in our clinic were noncompressible bilaterally 6/20; patient's wound on the right posterior calf is better. She brought in her compression pumps today and says that he still do not fit her leg. She also has external compression stockings at home but she did not bring those in today. We have her in 4-layer compression she is actually doing quite well much less swelling in the right leg She has old melanoma surgery on her right leg but she is quite adamant that she did not have any radiation of this was back in the 1970s 6/29; right posterior calf wound. This appears somewhat dry but looks as though it is attempting to  epithelialize. We have better edema control. We have somebody coming out to go over her compression pumps with her on 7/12. 7/15; patient presents because she is having increased irritation to her ankles. She has reported more weeping than usual to her lower extremities. She would like to switch from the Coflex used at last clinic visit to a 4-layer compression wrap. She also states she does not want to use Hydrofera Blue. Her compression pumps are coming in on Wednesday. 7/19; I have not seen this patient in quite a while. At that point we are dealing with an area on the right posterior calf in the setting of severe bilateral lymphedema. I do not remember they are actually being a problem on the left leg. However Dr. Heber Clifton reports significant erythema and weeping on the left lower leg last week. We initially were using Coflex and change this to 4-layer compression last week although this is not really helped. She comes in today with a lot of discomfort around the left ankle and marked weeping edema. Complicating things she is traveling to [place] leaving on Sunday. She says that some members of her family or physicians and presumably they might be able to help with an Ace wrap beyond that I do not know what were going to do here. She has made arrangements for a visitation for her compression pumps  this Thursday. 8/1; patient presents for 2-week follow-up. She has been using Unna boots with silver alginate underneath. She reports improvement in her wounds. She overall reports feeling well. She has no issues or complaints today. She denies signs of infection. 8/16; patient comes back in follow-up. Since last time I saw her things are actually going quite a bit better. She has a weeping area on the right lower ankle pantaloon deformity posteriorly and probably an area just above the ankle anteriorly on the left as well. She has compression pumps which came last week however she was ill with  COVID and did not get started on them. She is familiar with the lymphedema clinic in [place] and I think we are going to have to refer her back there. 8/23; she arrives in clinic today with erythema over the majority of her right lower leg. She says that over Saturday night she felt a sudden pain in her right leg rolled over and feels that drainage got stuck on the bed and it pulled her skin off. Since then she has not been using the compression pumps. She arrives in clinic today with the discoloration I which I think is probably stasis dermatitis rather than cellulitis. She is not systemically unwell. The plan here is to get her areas to close and then to refer her back to the lymphedema clinic. I doubt we could order her standard compression stockings. She has her upgraded compression pumps but for 1 reason or another we have not been able to get these on for a week reliably. I think she still has an area on the right posterior calf that is weeping and open although certainly close to closing. We are using silver alginate under Unna boot compression 8/30; she says she has been dealing with the supplier of her new compression pumps finding the pumps that they brought far too tight and not is evenly dispensed in terms of compression. I am not totally sure what the issue was however there is apparently new pumps on the way. Fortunately her wound areas are all essentially resolved. She has some weeping spots but I do not expect this to totally resolve 9/6; she has her compression pumps but has not started to use them. We have been using Unna boots and really she has only 1 area of any concern which is on the right posterior calf just above the bottom of the pantaloon deformity. This is not really a wound however there is continued leaking edema in this area. I have asked her to start using her compression pumps twice a day for 1 hour Objective Constitutional Sitting or standing Blood Pressure is  within target range for patient.. Pulse regular and within target range for patient.[name] Respirations regular, non-labored and within target range.. Temperature is normal and within the target range for the patient.[name] Appears in no distress. Vitals Time Taken: 3:15 PM, Height: 62 in, Weight: 268 lbs, BMI: 49, Temperature: 97.6 F, Pulse: 61 bpm, Respiratory Rate: 18 breaths/min, Blood Pressure: 135/67 mmHg. General Notes: Wound exam; right lower extremity pantaloon deformity. Still a small draining area. Somewhat reddened around this but no evidence of cellulitis. Overall the edema in her bilateral lower legs is a lot better. Integumentary (Hair, Skin) She has skin deformities in her bilateral lower legs related to lymphedema. Most of these are nodules and stasis dermatitis. However on the right medial lower leg there is a flat verricused area. 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 - Dr. Dellia Nims Other: - Call to schedule appointment to get re-established at [facility] in [place] Bathing/ Shower/ Hygiene: May shower with protection but do not get wound dressing(s) wet. Edema Control - Lymphedema / SCD / Other: Lymphedema Pumps. Use Lymphedema pumps on leg(s) 2-3 times a day for 45-60 minutes. If wearing any wraps or hose, do not remove them. Continue exercising as instructed. - use over compression wraps Elevate legs to the level of the heart or above for 30 minutes daily and/or when sitting, a frequency of: - throughout the day Avoid standing for long periods of time. Exercise regularly Non Wound Condition: Apply the following to affected area as directed: - Apply silver alginate to any  weeping areas, cover with ABDs and Unna Boot to bilateral legs. Use TCA/Zinc Cream and ABD pad in fold of posterior ankles. 1. Still using calcium alginate underneath Unna boots on the bilateral lower legs 2. I have asked her to start using her compression pumps for 1 hour twice daily starting tomorrow. 3. Also asked her to make an appointment with the lymphedema clinic in [place] where she is previously been a patient. 4. I have given her appointment for next week although she may be able to cancel this if indeed she is able to get in at the lymphedema clinic before then Electronic Signature(s) Signed: 04/15/2021 5:13:37 PM By: [name] MD Entered By: [name] on 04/15/2021 16:27:42 -------------------------------------------------------------------------------- SuperBill Details Patient Name: Date of Service: [name], [name] 04/15/2021 Medical Record Number: [id number] Patient Account Number: [id number] Date of Birth/Sex: Treating RN: 06/24/1941 (80 y.o. [name], Lauren Primary Care Provider: [name] Other Clinician: Referring Provider: Treating Provider/Extender: Heidi Dach in Treatment: 12 Diagnosis Coding ICD-10 Codes Code Description I89.0 Lymphedema, not elsewhere classified [id number] Non-pressure chronic ulcer of right calf limited to breakdown of skin L97.828 Non-pressure chronic ulcer of other part of left lower leg with other specified severity L03.115 Cellulitis of right lower limb Physician Procedures : CPT4 Code Description Modifier [id number] - WC PHYS LEVEL 3 - EST PT ICD-10 Diagnosis Description I89.0 Lymphedema, not elsewhere classified L97.211 Non-pressure chronic ulcer of right calf limited to breakdown of skin Quantity: 1 Electronic Signature(s) Signed: 04/15/2021 5:13:37 PM By: [name] MD Entered By: [name] on 04/15/2021 16:27:59

## 2021-04-21 ENCOUNTER — Other Ambulatory Visit: Payer: Self-pay

## 2021-04-21 ENCOUNTER — Ambulatory Visit: Payer: Medicare Other | Attending: Family Medicine | Admitting: Occupational Therapy

## 2021-04-21 DIAGNOSIS — I890 Lymphedema, not elsewhere classified: Secondary | ICD-10-CM | POA: Diagnosis present

## 2021-04-21 NOTE — Therapy (Signed)
[place] MAIN [facility] SERVICES [address] [place], [place], [address] Phone: [phone]   Fax:  [phone]  Occupational  Therapy Re-Evaluation and Treatment Note: Lymphedema Care  Patient Details  Name: Lisa [name] MRN: [id number] Date of Birth: 03-Aug-1941 No data recorded  Encounter Date: 04/21/2021   OT End of Session - 04/21/21 1258     Visit Number 8    Number of Visits 36    Date for OT Re-Evaluation 03/13/21    OT Start Time 1005    OT Stop Time 1105    OT Time Calculation (min) 60 min    Activity Tolerance Patient tolerated treatment well;No increased pain;Other (comment)   Pt limited by impaired functional mobility and transfers, and by LB weakness   Behavior During Therapy [facility] for tasks assessed/performed             Past Medical History:  Diagnosis Date   CAD in native artery 2017   stent to LAD   Cardiac arrest ([place]) 09/2015   CHF (congestive heart failure) (HCC)    CML (chronic myelocytic leukemia) (HCC)    Complication of anesthesia    Hx: UTI (urinary tract infection)    Hypertension    Melanoma ([place])    PONV (postoperative nausea and vomiting)    PVC (premature ventricular contraction)    HISTORY OF   Thyroid disease    V tach ([place])     Past Surgical History:  Procedure Laterality Date   ABDOMINAL HYSTERECTOMY     APPENDECTOMY     CARDIAC CATHETERIZATION N/A 10/03/2015   Procedure: Left Heart Cath and Coronary Angiography;  Surgeon: [name], MD;  Location: [place] [facility];  Service: Cardiovascular;  Laterality: N/A;   CARDIAC CATHETERIZATION N/A 10/07/2015   Procedure: Coronary Stent Intervention;  Surgeon: [name], MD;  Location: [place] [facility];  Service: Cardiovascular;  Laterality: N/A;   CARDIAC CATHETERIZATION  09/22/2019   EP IMPLANTABLE DEVICE N/A 01/20/2016   Procedure: BiV ICD Insertion CRT-D;  Surgeon: [name], MD;  Location: [place] [facility];   Service: Cardiovascular;  Laterality: N/A;   LEFT HEART CATH AND CORONARY ANGIOGRAPHY N/A 09/22/2019   Procedure: LEFT HEART CATH AND CORONARY ANGIOGRAPHY;  Surgeon: Martinique, Peter M, MD;  Location: [place] [facility];  Service: Cardiovascular;  Laterality: N/A;   TONSILLECTOMY     TONSILLECTOMY     TUBAL LIGATION      There were no vitals filed for this visit.   Subjective Assessment - 04/21/21 1418     Subjective  Lisa [name] presents for OT Rx visit 8/36  to address  severe, BLE lymphedema. Pt returns  to OT today after completing a course of wound care when wounds developed and interrupted LE Rx. Pt was initially seen for OT from 12/11/20 through 01/01/21 for 1 initial evaluation and 6 treatment visits. Ms. Mckelvin presents without compression in place on her legs.  She tells me she has 2 very slight open areas with lymphorrhea today. She reports she was not fit with compression garments at wound clinic, but she was perscribed a basic sequential pneumatic "pump" . She was told to use this device 2-3 x daily for 1 hr at a time over compression wraps. Pt tells me she does not have a caregiver who can assist her with lymphedema home program during visit intervals , including daily application of short stretch compression wraps from foot to tibial tuberosity  on one leg at a time. Pt vern=balizes understanding that without compression wraps her prognosis for reducing limb volume is poor.    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/I1, Chronic systolic congestive heart failure, severe, stage II-Mild stage III BLE lymphedema ( onset in 20's), hypothyroidism, HTN, Ventricular fibrillation, pacer regularly interrogated, hx skin breakdown on leg, chronic myologeneous leukemia, Hx R leg melanoma w/ wide excision, s/p cardiac arrest 2017    Limitations chronic leg swelling and pain, decreased balance, decreased A/PROM , decreased lower body strength (unable to lift legs), impaired endurance    Repetition Increases  Symptoms    Special Tests +Stemmer bilaterally base of toes; Intake FOTO TBA    Patient Stated Goals reduce leg swelling, improve functional mobility and safe ambulation, improve skin condition, have better systemic fluid balance,    Pain Onset More than a month ago                          OT Treatments/Exercises (OP) - 04/21/21 1427       ADLs   ADL Education Given Yes      Manual Therapy   Manual Therapy Edema management;Manual Lymphatic Drainage (MLD)    Manual therapy comments skin inspection                    OT Education - 04/21/21 1428     Education Details Pt educated re adjustable, Velcro style, knee length , alternative compression leggings to be used when traditional elastic style compression garments are difficult or impossible to don and/ or doff. Provided examples of various garments and process of determining correct compression. Discussed insurance funding. Explored cost for custom and provided a few internet resources.Pt educated re differences between basic and advanced sequential pneumatic compression device, or "pump". Pt's wound care provider has ordered a basic pump. We checked today and her medicare plan will not cover a Flexitouch.    Person(s) Educated Patient    Methods Explanation;Demonstration;Handout    Comprehension Verbalized understanding;Returned demonstration;Need further instruction                 OT Long Term Goals - 04/21/21 1414       OT LONG TERM GOAL #1   Title Pt will be able to apply knee length, multi-layer, short stretch compression wraps to one leg using correct gradient techniques with Max caregiver assistance to decrease limb volume, to decrease leg pain and limit infection risk, and to improve safe functional ambulation and mobility.    Baseline dependent    Time 4    Period Days    Status Not Met   Pt does not have caregiver who can assist her daily with short stretch, gradient compression wraps  between visits. Without daily compression prognosis for limb volume reduction is poor.   Target Date 07/20/21   goal discharged     OT LONG TERM GOAL #2   Title Pt will demonstrate understanding of lymphedema precautions  by being able to name and offer examples of 6 strategies using a printed reference for PRN.    Baseline Max A    Period Days    Status Not Met    Target Date 07/20/21      OT LONG TERM GOAL #3   Title Pt will achieve and sustain a least 85% compliance performing all daily LE self-care home program components throughout Intensive Phase CDT, including elevation when seated, recommended skin care regime, lymphatic pumping  ther ex, 23/7 compression wraps and simple self-MLD, with Max caregiver assistance to ensure optimal limb volume reduction, to limit infection risk and to limit LE progression.    Baseline dependent    Time 4    Period Weeks    Status Not Met    Target Date 07/20/21      OT LONG TERM GOAL #4   Title Pt will achieve at least 10%  limb volume reductions bilaterally during Intensive Phase CDT to prevent re-accumulation of lymphatic congestion and progression of fibrosis, to limit infection risk, to improve functional ambulation and transfers, and  to improve functional performance of basic and instrumental ADLs, and to limit LE progression.    Baseline dependent    Time 12    Period Weeks    Status Not Met    Target Date 07/20/21      OT LONG TERM GOAL #5   Title By DC from OT Pt will be able to don and doff appropriate daytime compression garments and HOS devices with Max CG assistance to limit lymphatic re-accumulation and LE progression with before transitioning to self-management phase of CDT.    Baseline dependent    Time 12    Period Weeks    Status Not Met    Target Date 07/20/21      Long Term Additional Goals   Additional Long Term Goals Yes      OT LONG TERM GOAL #6   Title Pt will be able to don and doff alternative adjustable compression  leggings with modified independence (extra time) to limit limb swelling and infection risk. Pt does not have CG available to assist her with any other available compression modality.    Baseline dependent    Time 4    Period Weeks    Status New    Target Date 05/19/21                   Plan - 04/21/21 1432     Clinical Impression Statement Marguarite LARESA OSHIRO presents with moderate-sever, BLE , Lipo-lymphedema. Pt returns to OT today after CDT  was interupted by the development of bilateral worsening leg wounds with intractable lymphorrhea . Pt was treated at wound clinic from 01/20/21 through 04/15/21, when she was DC to return to OT. Leg swelling and redness are visibly reduced since last seen. Tissue density, including moderate, high protien, and fatty fibrosis and dense, non-pitting fluid conjestion are observed below the knees bilaterally. Feet are largely spared with negative Stemmer sighns. Pt presents with bilateral pataloon defornities, typical of lipo-lipedema. Lymphatic cysts are distributed circumferentially at distal legs. Several full lymphatic blisters are observed. Slight "weeping" is observed at pantaloon region. Pt is a good candidate for lymphedema care, but because she is unable to reach her feet and legs to apply compression wraps and/ or to don and doff compression stockings a caregive who is able to assist with these self-care activities is necessary to manage lymphedema. Pt can not go for days between lymphedema visits without removing short stretch, gradient wraps and inspecting and bathing skin before re-wrapping her legs.  She tells me that she has no access to a caregiver, so her prognosis for limb volume reduction and further improvement in her condition is poor. As an alternative to re-commencing CDT without a caregiver, Pt agrees with plan to fit BLE, knee length, CircAid adjustable compression leggings. We will also explore insurance benefits for and advances rather  than a basic sequential pneumatic  compression "pump". If insurance benefits are available Pt will undergo a trial with the Flexitouch.    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 Pt is unable to reach feet to apply essential multi-layer compression wraps .   Clinical Decision Making Several treatment options, min-mod task modification necessary    Comorbidities Affecting Occupational Performance: Presence of comorbidities impacting occupational performance    Comorbidities impacting occupational performance description: see Subjective    Modification or Assistance to Complete Evaluation  Min-Moderate modification of tasks or assist with assess necessary to complete eval    OT Frequency 2x / week    OT Duration 12 weeks   and ongoing PRN for management phase CDT support   OT Treatment/Interventions Self-care/ADL training;DME and/or AE instruction;Manual lymph drainage;Therapeutic activities;Compression bandaging;Therapeutic exercise;Coping strategies training;Other (comment);Functional Mobility Training;Manual Therapy;Patient/family education   skin care with low ph castor oil during MLD   Plan Fit Pt w custom, knee length, adjustable CircAid compression leggings- calibrate to ccl 3. Provide MLD and compression bandaging until compression garments are fitted and Pt is able to don and  doff with modified independence (extra time)    Recommended Other Services Pt will benefit from PT to improve balance, increase LB strength, increase endurance , and improve functional ambulation and mobility. Screening request submitted 12/11/20. Sequential pneumatic compression device contraindicated for this patient due to extensive cardiac precautions.    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 [place], [place], [address] Phone: [phone]   Fax:  [phone]  Name: ONEIKA SIMONIAN MRN: [id number] Date of Birth: 07-31-1941

## 2021-04-21 NOTE — Patient Instructions (Signed)

## 2021-04-22 ENCOUNTER — Encounter (HOSPITAL_BASED_OUTPATIENT_CLINIC_OR_DEPARTMENT_OTHER): Payer: Medicare Other | Admitting: Internal Medicine

## 2021-04-24 ENCOUNTER — Ambulatory Visit: Payer: Medicare Other | Admitting: Occupational Therapy

## 2021-04-24 ENCOUNTER — Other Ambulatory Visit: Payer: Self-pay

## 2021-04-24 DIAGNOSIS — I890 Lymphedema, not elsewhere classified: Secondary | ICD-10-CM | POA: Diagnosis not present

## 2021-04-24 NOTE — Therapy (Signed)
[place] MAIN [facility] SERVICES [address] [place], [place], [address] Phone: [phone]   Fax:  [phone]  Occupational Therapy Treatment  Patient Details  Name: Lisa [name] MRN: [id number] Date of Birth: 10-10-1940 No data recorded  Encounter Date: 04/24/2021   OT End of Session - 04/24/21 1202     Visit Number 9    Number of Visits 36    Date for OT Re-Evaluation 07/21/21    OT Start Time 1005    OT Stop Time 1115    OT Time Calculation (min) 70 min    Activity Tolerance Patient tolerated treatment well;No increased pain;Other (comment)   Pt limited by impaired functional mobility and transfers, and by LB weakness   Behavior During Therapy [facility] for tasks assessed/performed             Past Medical History:  Diagnosis Date   CAD in native artery 2017   stent to LAD   Cardiac arrest ([place]) 09/2015   CHF (congestive heart failure) (HCC)    CML (chronic myelocytic leukemia) (HCC)    Complication of anesthesia    Hx: UTI (urinary tract infection)    Hypertension    Melanoma ([place])    PONV (postoperative nausea and vomiting)    PVC (premature ventricular contraction)    HISTORY OF   Thyroid disease    V tach ([place])     Past Surgical History:  Procedure Laterality Date   ABDOMINAL HYSTERECTOMY     APPENDECTOMY     CARDIAC CATHETERIZATION N/A 10/03/2015   Procedure: Left Heart Cath and Coronary Angiography;  Surgeon: [name], MD;  Location: [place] [facility];  Service: Cardiovascular;  Laterality: N/A;   CARDIAC CATHETERIZATION N/A 10/07/2015   Procedure: Coronary Stent Intervention;  Surgeon: [name], MD;  Location: [place] [facility];  Service: Cardiovascular;  Laterality: N/A;   CARDIAC CATHETERIZATION  09/22/2019   EP IMPLANTABLE DEVICE N/A 01/20/2016   Procedure: BiV ICD Insertion CRT-D;  Surgeon: [name], MD;  Location: [place] [facility];  Service: Cardiovascular;  Laterality: N/A;    LEFT HEART CATH AND CORONARY ANGIOGRAPHY N/A 09/22/2019   Procedure: LEFT HEART CATH AND CORONARY ANGIOGRAPHY;  Surgeon: Martinique, Peter M, MD;  Location: [place] [facility];  Service: Cardiovascular;  Laterality: N/A;   TONSILLECTOMY     TONSILLECTOMY     TUBAL LIGATION      There were no vitals filed for this visit.   Subjective Assessment - 04/24/21 1241     Subjective  Lisa [name] presents for OT Rx visit 9/36 to address severe BLE lymphedema. Pt brings lymphedema compression wraps to clinic. LE-related pain is unchanged since re-evaluation and resumtion of OT 2 days ago.    Pertinent History CKD, Stage G5O/I3, Chronic systolic congestive heart failure, severe, stage II-Mild stage III BLE lymphedema ( onset in 20's), hypothyroidism, HTN, Ventricular fibrillation, pacer regularly interrogated, hx skin breakdown on leg, chronic myologeneous leukemia, Hx R leg melanoma w/ wide excision, s/p cardiac arrest 2017    Limitations chronic leg swelling and pain, decreased balance, decreased A/PROM , decreased lower body strength (unable to lift legs), impaired endurance    Repetition Increases Symptoms    Special Tests +Stemmer bilaterally base of toes; Intake FOTO TBA    Patient Stated Goals reduce leg swelling, improve functional mobility and safe ambulation, improve skin condition, have better systemic fluid balance,    Pain Onset More than a month  ago                          OT Treatments/Exercises (OP) - 04/24/21 1244       ADLs   ADL Education Given Yes      Manual Therapy   Manual Therapy Edema management;Manual Lymphatic Drainage (MLD);Compression Bandaging    Manual therapy comments skin inspection, skin care throughout RLE MLD    Manual Lymphatic Drainage (MLD) MLD to LLE/LLW using typical, non-cancer related sequences, including J strokes to terminus only ( no neck strokes in keeping w thyroid precautions), functional inguinal LN and J strokes to thigh first,  then medial knee. No strokes below the knee due to lymphorrea and skin skin irritation.    Compression Bandaging multi layer gradient compression wraps to BLE below the knees , omitting feet.                    OT Education - 04/24/21 1249     Education Details Continued Pt/ CG edu for lymphedema self care  and home program throughout session. Topics include multilayer, gradient compression wrapping, simple self-MLD, therapeutic lymphatic pumping exercises, skin/nail care, risk reduction factors and LE precautions, compression garments/recommendations and wear and care schedule and compression garment donning / doffing using assistive devices. All questions answered to the Pt's satisfaction, and Pt demonstrates understanding by report. Pt edu for Pandere expandable, adjustable shoes.    Person(s) Educated Patient    Methods Explanation;Demonstration;Handout    Comprehension Verbalized understanding;Returned demonstration;Need further instruction                 OT Long Term Goals - 04/21/21 1414       OT LONG TERM GOAL #1   Title Pt will be able to apply knee length, multi-layer, short stretch compression wraps to one leg using correct gradient techniques with Max caregiver assistance to decrease limb volume, to decrease leg pain and limit infection risk, and to improve safe functional ambulation and mobility.    Baseline dependent    Time 4    Period Days    Status Not Met   Pt does not have caregiver who can assist her daily with short stretch, gradient compression wraps between visits. Without daily compression prognosis for limb volume reduction is poor.   Target Date 07/20/21   goal discharged     OT LONG TERM GOAL #2   Title Pt will demonstrate understanding of lymphedema precautions  by being able to name and offer examples of 6 strategies using a printed reference for PRN.    Baseline Max A    Period Days    Status Not Met    Target Date 07/20/21      OT LONG  TERM GOAL #3   Title Pt will achieve and sustain a least 85% compliance performing all daily LE self-care home program components throughout Intensive Phase CDT, including elevation when seated, recommended skin care regime, lymphatic pumping ther ex, 23/7 compression wraps and simple self-MLD, with Max caregiver assistance to ensure optimal limb volume reduction, to limit infection risk and to limit LE progression.    Baseline dependent    Time 4    Period Weeks    Status Not Met    Target Date 07/20/21      OT LONG TERM GOAL #4   Title Pt will achieve at least 10%  limb volume reductions bilaterally during Intensive Phase CDT to prevent re-accumulation of  lymphatic congestion and progression of fibrosis, to limit infection risk, to improve functional ambulation and transfers, and  to improve functional performance of basic and instrumental ADLs, and to limit LE progression.    Baseline dependent    Time 12    Period Weeks    Status Not Met    Target Date 07/20/21      OT LONG TERM GOAL #5   Title By DC from OT Pt will be able to don and doff appropriate daytime compression garments and HOS devices with Max CG assistance to limit lymphatic re-accumulation and LE progression with before transitioning to self-management phase of CDT.    Baseline dependent    Time 12    Period Weeks    Status Not Met    Target Date 07/20/21      Long Term Additional Goals   Additional Long Term Goals Yes      OT LONG TERM GOAL #6   Title Pt will be able to don and doff alternative adjustable compression leggings with modified independence (extra time) to limit limb swelling and infection risk. Pt does not have CG available to assist her with any other available compression modality.    Baseline dependent    Time 4    Period Weeks    Status New    Target Date 05/19/21                   Plan - 04/24/21 1237     Clinical Impression Statement Lisa [name] presents for OT Rx visit 9 to  address severe BLE lymphedema. Pt brings lymphedema compression supplies to clinic. Lymphedema -related pain is unchanged since initial re-eval and resumption of OT for CDT 2 days ago. RLE has small bleeding abraision  at anterior distal leg. LLE presents with moderate Lymphorrhea  distally and laterally. Pt has ~ 4 x 4 pad  applied with tape to posterior distal R leg, stating the wound is all healed but she likes to keep it covered. No lymphorrhea observed at dressing site. Provided MLD to LLE utilizing established proximal to distal sequence. Performed lite skin care throughout MLD with low ph castor oil. Skin very indurrated and difficult to mobilize for effective MLD. BLE leg wrapped with short stretch bandages over Lisa [name] from ankles to  below knees. Pt walked at typical pace from clinic w/o LOB observed. Informed Pt    that she does not meet Medicare criterion for an advanced "pump", so instructed Pt that we would not perform a trial since purchase would be OOP. OT has not heard back from the DME vendor re the cost of BLE custom adjusable CircAid wraps. Cont as per POC.    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 Pt is unable to reach feet to apply essential multi-layer compression wraps .   Clinical Decision Making Several treatment options, min-mod task modification necessary     Comorbidities Affecting Occupational Performance: Presence of comorbidities impacting occupational performance    Comorbidities impacting occupational performance description: see  Subjective    Modification or Assistance to Complete Evaluation  Min-Moderate modification of tasks or assist with assess necessary to complete eval    OT Frequency 2x / week    OT Duration 12 weeks   and ongoing PRN for management phase CDT support   OT Treatment/Interventions Self-care/ADL training;DME and/or AE instruction;Manual lymph drainage;Therapeutic activities;Compression bandaging;Therapeutic exercise;Coping strategies training;Other (comment);Functional Mobility Training;Manual Therapy;Patient/family education   skin care with low ph castor oil during MLD   Plan Fit Pt w custom, knee length, adjustable CircAid compression leggings- calibrate to ccl 3. Provide MLD and compression bandaging until compression garments are fitted and Pt is able to don and doff with modified independence (extra time)    Recommended Other Services Pt will benefit from PT to improve balance, increase LB strength, increase endurance , and improve functional ambulation and mobility. Screening request submitted 12/11/20. Sequential pneumatic compression device contraindicated for this patient due to extensive cardiac precautions.    Consulted and Agree with Plan of Care Patient             Patient will benefit from skilled therapeutic intervention in order to improve the following deficits and impairments:   Body Structure / Function / Physical Skills: ADL, Decreased knowledge of use of DME, Balance, Pain, Edema, ROM, IADL, Endurance, Scar mobility, Mobility, Decreased knowledge of precautions, Skin integrity       Visit Diagnosis: Lymphedema    Problem List Patient Active Problem List   Diagnosis Date Noted   Sustained VT (ventricular tachycardia) ([place]) 09/21/2019   ICD (implantable cardioverter-defibrillator) in place  06/14/2019   Acute bronchitis 11/02/2016   Unstable angina ([place]) 87/68/1157   Chronic systolic heart failure ([place])    PVC's (premature ventricular contractions)    Ventricular fibrillation ([place]) 10/03/2015   Cardiac arrest ([place]) 10/03/2015   Essential hypertension 09/09/2015   Lymphedema 02/21/2015   Other specified hypothyroidism 02/21/2015    Andrey Spearman, MS, OTR/L, CLT-LANA 04/24/21 1:00 PM   [facility] [address] [place], [place], [address] Phone: [phone]   Fax:  [phone]  Name: Lisa [name] MRN: [id number] Date of Birth: Oct 16, 1940

## 2021-04-24 NOTE — Addendum Note (Signed)
Addended by: Ansel Bong on: 04/24/2021 12:33 PM   Modules accepted: Orders

## 2021-04-24 NOTE — Patient Instructions (Signed)

## 2021-04-29 ENCOUNTER — Ambulatory Visit: Payer: Medicare Other | Admitting: Occupational Therapy

## 2021-04-30 ENCOUNTER — Ambulatory Visit (INDEPENDENT_AMBULATORY_CARE_PROVIDER_SITE_OTHER): Payer: Medicare Other

## 2021-04-30 DIAGNOSIS — I255 Ischemic cardiomyopathy: Secondary | ICD-10-CM

## 2021-04-30 LAB — CUP PACEART REMOTE DEVICE CHECK
Battery Remaining Longevity: 30 mo
Battery Voltage: 2.95 V
Brady Statistic AP VP Percent: 0.24 %
Brady Statistic AP VS Percent: 0.02 %
Brady Statistic AS VP Percent: 98.1 %
Brady Statistic AS VS Percent: 1.63 %
Brady Statistic RA Percent Paced: 0.26 %
Brady Statistic RV Percent Paced: 0.06 %
Date Time Interrogation Session: 20220921031605
HighPow Impedance: 48 Ohm
Implantable Lead Implant Date: 20170612
Implantable Lead Implant Date: 20170612
Implantable Lead Implant Date: 20170612
Implantable Lead Location: 753858
Implantable Lead Location: 753859
Implantable Lead Location: 753860
Implantable Lead Model: 4398
Implantable Lead Model: 5076
Implantable Pulse Generator Implant Date: 20170612
Lead Channel Impedance Value: 142.5 Ohm
Lead Channel Impedance Value: 169.459
Lead Channel Impedance Value: 169.459
Lead Channel Impedance Value: 178.125
Lead Channel Impedance Value: 178.125
Lead Channel Impedance Value: 228 Ohm
Lead Channel Impedance Value: 285 Ohm
Lead Channel Impedance Value: 285 Ohm
Lead Channel Impedance Value: 285 Ohm
Lead Channel Impedance Value: 418 Ohm
Lead Channel Impedance Value: 418 Ohm
Lead Channel Impedance Value: 475 Ohm
Lead Channel Impedance Value: 475 Ohm
Lead Channel Impedance Value: 589 Ohm
Lead Channel Impedance Value: 608 Ohm
Lead Channel Impedance Value: 646 Ohm
Lead Channel Impedance Value: 646 Ohm
Lead Channel Impedance Value: 665 Ohm
Lead Channel Pacing Threshold Amplitude: 0.75 V
Lead Channel Pacing Threshold Amplitude: 1.125 V
Lead Channel Pacing Threshold Amplitude: 1.5 V
Lead Channel Pacing Threshold Pulse Width: 0.4 ms
Lead Channel Pacing Threshold Pulse Width: 0.4 ms
Lead Channel Pacing Threshold Pulse Width: 0.8 ms
Lead Channel Sensing Intrinsic Amplitude: 2.625 mV
Lead Channel Sensing Intrinsic Amplitude: 2.625 mV
Lead Channel Sensing Intrinsic Amplitude: 4.75 mV
Lead Channel Sensing Intrinsic Amplitude: 4.75 mV
Lead Channel Setting Pacing Amplitude: 2 V
Lead Channel Setting Pacing Amplitude: 2.25 V
Lead Channel Setting Pacing Amplitude: 2.5 V
Lead Channel Setting Pacing Pulse Width: 0.4 ms
Lead Channel Setting Pacing Pulse Width: 0.8 ms
Lead Channel Setting Sensing Sensitivity: 0.3 mV

## 2021-05-02 ENCOUNTER — Other Ambulatory Visit: Payer: Self-pay

## 2021-05-02 ENCOUNTER — Ambulatory Visit: Payer: Medicare Other | Admitting: Occupational Therapy

## 2021-05-02 DIAGNOSIS — I890 Lymphedema, not elsewhere classified: Secondary | ICD-10-CM | POA: Diagnosis not present

## 2021-05-02 NOTE — Patient Instructions (Signed)

## 2021-05-02 NOTE — Therapy (Signed)
[place] MAIN [facility] SERVICES [address] [place], [place], [address] Phone: [phone]   Fax:  [phone]  Occupational Therapy Treatment Note and Progress Report  Patient Details  Name: Lisa [name] MRN: [id number] Date of Birth: [birth year] Reporting Period: 12/11/20 - 05/02/21   Encounter Date: 05/02/2021   OT End of Session - 05/02/21 0906     Visit Number 10    Number of Visits 36    Date for OT Re-Evaluation 07/21/21    OT Start Time 0900    OT Stop Time 1010    OT Time Calculation (min) 70 min    Activity Tolerance Patient tolerated treatment well;No increased pain;Other (comment)   Pt limited by impaired functional mobility and transfers, and by LB weakness   Behavior During Therapy [facility] for tasks assessed/performed             Past Medical History:  Diagnosis Date   CAD in native artery 2017   stent to LAD   Cardiac arrest ([place]) 09/2015   CHF (congestive heart failure) (HCC)    CML (chronic myelocytic leukemia) (HCC)    Complication of anesthesia    Hx: UTI (urinary tract infection)    Hypertension    Melanoma ([place])    PONV (postoperative nausea and vomiting)    PVC (premature ventricular contraction)    HISTORY OF   Thyroid disease    V tach ([place])     Past Surgical History:  Procedure Laterality Date   ABDOMINAL HYSTERECTOMY     APPENDECTOMY     CARDIAC CATHETERIZATION N/A 10/03/2015   Procedure: Left Heart Cath and Coronary Angiography;  Surgeon: [name], MD;  Location: [place] [facility];  Service: Cardiovascular;  Laterality: N/A;   CARDIAC CATHETERIZATION N/A 10/07/2015   Procedure: Coronary Stent Intervention;  Surgeon: [name], MD;  Location: [place] [facility];  Service: Cardiovascular;  Laterality: N/A;   CARDIAC CATHETERIZATION  09/22/2019   EP IMPLANTABLE DEVICE N/A 01/20/2016   Procedure: BiV ICD Insertion CRT-D;  Surgeon: [name], MD;  Location: [place] [facility];   Service: Cardiovascular;  Laterality: N/A;   LEFT HEART CATH AND CORONARY ANGIOGRAPHY N/A 09/22/2019   Procedure: LEFT HEART CATH AND CORONARY ANGIOGRAPHY;  Surgeon: Martinique, Peter M, MD;  Location: [place] [facility] LAB;  Service: Cardiovascular;  Laterality: N/A;   TONSILLECTOMY     TONSILLECTOMY     TUBAL LIGATION      There were no vitals filed for this visit.   Subjective Assessment - 05/02/21 1228     Subjective  Shell Mowers presents for OT Rx visit 10/36 to address severe BLE lymphedema. Pt brings lymphedema compression wraps to clinic. LE-related pain is unchanged since re-evaluation .    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 05/02/21 FOTO: 48/100    Patient Stated Goals reduce leg swelling, improve functional mobility and safe ambulation, improve skin condition, have better systemic fluid balance,    Pain Onset More than a month ago  LYMPHEDEMA/ONCOLOGY QUESTIONNAIRE - 05/02/21 0944       Lymphedema Assessments   Lymphedema Assessments Lower extremities      Right Lower Extremity Lymphedema   Other Initial RLE A-D limb volume = 11505.8 ml.    Other LLE is reduced in volume by 1.3% since inital measurements for this episode on 12/09/20.      Left Lower Extremity Lymphedema   Other 10th visit comaprative limb voluetrics reveal Leg volume measures  11286.6 ml.   LLE is reduced in volume by 4.2 % since inital measurements for this episode on  12/09/20.   Other Initial limb volume differential = 1.12%, L>R                     OT Treatments/Exercises (OP) - 05/02/21 1225       ADLs   ADL Education Given Yes      Manual Therapy   Manual Therapy Edema management;Manual Lymphatic Drainage (MLD);Compression Bandaging    Edema Management BLE comparatibve limb volumetrics    Compression Bandaging multi layer gradient compression wraps to BLE below the knees , omitting feet.                    OT Education - 05/02/21 1226     Education Details Continued Pt/ CG edu for lymphedema self care  and home program throughout session. Topics include multilayer, gradient compression wrapping, simple self-MLD, therapeutic lymphatic pumping exercises, skin/nail care, risk reduction factors and LE precautions, compression garments/recommendations and wear and care schedule and compression garment donning / doffing using assistive devices. All questions answered to the Pt's satisfaction, and Pt demonstrates understanding by report. Pt edu for [employer] expandable, adjustable shoes.    Person(s) Educated Patient    Methods Explanation;Demonstration;Handout    Comprehension Verbalized understanding;Returned demonstration;Need further instruction                 OT Long Term Goals - 05/02/21 1220       OT LONG TERM GOAL #1   Title Pt will be able to apply knee length, multi-layer, short stretch compression wraps to one leg using correct gradient techniques with Max caregiver assistance to decrease limb volume, to decrease leg pain and limit infection risk, and to improve safe functional ambulation and mobility.    Baseline dependent    Time 4    Period Days    Status Not Met   Pt does not have caregiver who can assist her daily with short stretch, gradient compression wraps between visits. Without daily compression prognosis for limb volume reduction is poor. 05/02/21: Wraps applied at clinical visits only.   Target Date  07/20/21      OT LONG TERM GOAL #2   Title Pt will demonstrate understanding of lymphedema precautions  by being able to name and offer examples of 6 strategies using a printed reference for PRN.    Baseline Max A    Period Days    Status Not Met    Target Date 07/20/21      OT LONG TERM GOAL #3   Title Pt will achieve and sustain a least 85% compliance performing all daily LE self-care home program components throughout Intensive Phase CDT, including elevation when seated, recommended skin care regime, lymphatic pumping ther ex, 23/7 compression wraps and simple self-MLD, with Max caregiver assistance to ensure optimal limb volume reduction, to limit infection risk and to limit LE progression.    Baseline dependent    Time  4    Period Weeks    Status Not Met   05/02/21: Pt unable to apply wraps, to perform skininspection and skin care to distal legs and feet due to limited ROM    and body habitus. Pt has not complied with recommendation for CG.   Target Date 07/20/21      OT LONG TERM GOAL #4   Title Pt will achieve at least 10%  limb volume reductions bilaterally during Intensive Phase CDT to prevent re-accumulation of lymphatic congestion and progression of fibrosis, to limit infection risk, to improve functional ambulation and transfers, and  to improve functional performance of basic and instrumental ADLs, and to limit LE progression.    Baseline dependent    Time 12    Period Weeks    Status Partially Met   9:23/22 : 10TH VISIT LLE dec 4.2%; RLE DEC 1.3%.   Target Date 07/20/21      OT LONG TERM GOAL #5   Title By DC from OT Pt will be able to don and doff appropriate daytime compression garments and HOS devices with Max CG assistance to limit lymphatic re-accumulation and LE progression with before transitioning to self-management phase of CDT.    Baseline dependent    Time 12    Period Weeks    Status Not Met    Target Date 07/20/21      OT LONG TERM GOAL #6   Title Pt will  be able to don and doff alternative adjustable compression leggings with modified independence (extra time) to limit limb swelling and infection risk. Pt does not have CG available to assist her with any other available compression modality.    Baseline dependent    Time 4    Period Weeks    Status On-going    Target Date 07/20/21                   Plan - 05/02/21 1216     Clinical Impression Statement Lisa [name] presents for OT Rx visit 10 to address severe BLE lymphedema. Pt brings lymphedema compression supplies to clinic. Reviewed progress towards all OT goals for lymphedema care and discussed POC going forward. Comparative limb volumetrics reveal bilateral limb volume reductions, 4.2% on the L leg and 1.3% on the R, since commencing this episode of  CDT on 12/16/20. Pt's 10th visit FOTO score is unchanged at 48/100 suggesting no improvement in functional performance since commencing OT.  Time constraints today limited plan to also complete custom adjustable compression garment measurements and measurements for expandable shoes. Pt is eager to complete garment measurements so she wil go directly to vendor for custom [employer]. We'll measure for shoes next time. Applied compression wraps bilaterally as established.    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 Pt is unable to reach feet to apply essential multi-layer compression wraps .   Clinical Decision Making Several treatment options, min-mod task modification necessary    Comorbidities Affecting Occupational Performance: Presence of comorbidities impacting occupational performance  Comorbidities impacting occupational performance description: see Subjective    Modification or Assistance to Complete Evaluation  Min-Moderate modification of tasks or assist with assess necessary to complete eval    OT Frequency 2x / week    OT Duration 12 weeks   and ongoing PRN for management phase CDT support   OT Treatment/Interventions Self-care/ADL training;DME and/or AE instruction;Manual lymph drainage;Therapeutic activities;Compression bandaging;Therapeutic exercise;Coping strategies training;Other (comment);Functional Mobility Training;Manual Therapy;Patient/family education   skin care with low ph castor oil during MLD   Plan Fit Pt w custom, knee length, adjustable CircA[employer] compression leggings- calibrate to ccl 3. Provide MLD and compression bandaging until compression garments are fitted and Pt is able to don and doff with modified independence (extra time)    Recommended Other Services Pt will benefit from PT to improve balance, increase LB strength, increase endurance , and improve functional ambulation and mobility. Screening request submitted 12/11/20. Sequential pneumatic compression device contraindicated for this patient due to extensive cardiac precautions.    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 [place], [place], [address] Phone: [phone]   Fax:  [phone]  Name: LETTICIA BHATTACHARYYA MRN: [id number] Date of Birth: 04/15/1941

## 2021-05-06 ENCOUNTER — Ambulatory Visit: Payer: Medicare Other | Admitting: Occupational Therapy

## 2021-05-06 ENCOUNTER — Other Ambulatory Visit: Payer: Self-pay

## 2021-05-06 DIAGNOSIS — I890 Lymphedema, not elsewhere classified: Secondary | ICD-10-CM | POA: Diagnosis not present

## 2021-05-06 NOTE — Patient Instructions (Signed)

## 2021-05-06 NOTE — Therapy (Signed)
[place] MAIN [facility] SERVICES [address] [place], [place], [address] Phone: [phone]   Fax:  [phone]  Occupational Therapy Treatment  Patient Details  Name: Lisa [name] MRN: [id number] Date of Birth: [birth year] No data recorded  Encounter Date: 05/06/2021   OT End of Session - 05/06/21 1030     Visit Number 11    Number of Visits 36    Date for OT Re-Evaluation 07/21/21    OT Start Time 1110    OT Stop Time 1210    OT Time Calculation (min) 60 min    Activity Tolerance Patient tolerated treatment well;No increased pain;Other (comment)   Pt limited by impaired functional mobility and transfers, and by LB weakness   Behavior During Therapy [name] for tasks assessed/performed             Past Medical History:  Diagnosis Date   CAD in native artery 2017   stent to LAD   Cardiac arrest ([place]) 09/2015   CHF (congestive heart failure) (HCC)    CML (chronic myelocytic leukemia) (HCC)    Complication of anesthesia    Hx: UTI (urinary tract infection)    Hypertension    Melanoma ([place])    PONV (postoperative nausea and vomiting)    PVC (premature ventricular contraction)    HISTORY OF   Thyroid disease    V tach ([place])     Past Surgical History:  Procedure Laterality Date   ABDOMINAL HYSTERECTOMY     APPENDECTOMY     CARDIAC CATHETERIZATION N/A 10/03/2015   Procedure: Left Heart Cath and Coronary Angiography;  Surgeon: [name], MD;  Location: [place] CV LAB;  Service: Cardiovascular;  Laterality: N/A;   CARDIAC CATHETERIZATION N/A 10/07/2015   Procedure: Coronary Stent Intervention;  Surgeon: [name], MD;  Location: [place] CV LAB;  Service: Cardiovascular;  Laterality: N/A;   CARDIAC CATHETERIZATION  09/22/2019   EP IMPLANTABLE DEVICE N/A 01/20/2016   Procedure: BiV ICD Insertion CRT-D;  Surgeon: [name], MD;  Location: [place] CV LAB;  Service: Cardiovascular;  Laterality: N/A;    LEFT HEART CATH AND CORONARY ANGIOGRAPHY N/A 09/22/2019   Procedure: LEFT HEART CATH AND CORONARY ANGIOGRAPHY;  Surgeon: Martinique, Peter M, MD;  Location: [place] [facility] LAB;  Service: Cardiovascular;  Laterality: N/A;   TONSILLECTOMY     TONSILLECTOMY     TUBAL LIGATION      There were no vitals filed for this visit.   Subjective Assessment - 05/06/21 1035     Subjective  Lisa [name] presents for OT Rx visit 11/36 to address severe BLE lymphedema. Pt denies lymphedema related pain. Manufacturer's rep for [employer], Jerrell Mylar, is here today to assist w/ trial of basic lymphedema "pump" (R5188) and Flexitouch advanced sequential pneumatic compression device (C1660) to address BLE/BLQ  severe lymphedema (I89.0).Pt reports that she currently has a basic compression pump  perscribed by the wound care provider, but she does not use it because it does not fit    Pertinent History CKD, Stage Y3K/Z6, Chronic systolic congestive heart failure, severe, stage II-Mild stage III BLE lymphedema ( onset in 20's), hypothyroidism, HTN, Ventricular fibrillation, pacer regularly interrogated, hx skin breakdown on leg, chronic myologeneous leukemia, Hx R leg melanoma w/ wide excision, s/p cardiac arrest 2017    Limitations chronic leg swelling and pain, decreased balance, decreased A/PROM , decreased lower body strength (unable to lift legs), impaired endurance    Repetition Increases  Symptoms    Special Tests 12/11/20 FOTO 48/100. 05/02/21 FOTO: 48/100    Patient Stated Goals reduce leg swelling, improve functional mobility and safe ambulation, improve skin condition, have better systemic fluid balance,    Pain Onset More than a month ago                          OT Treatments/Exercises (OP) - 05/06/21 1040       ADLs   ADL Education Given Yes      Manual Therapy   Manual Therapy Edema management;Compression Bandaging    Edema Management SPCD trial with [employer] basic  Entre and advanced Flexitouch devices to LL w/ 30-40 mmHg for    Compression Bandaging no compression wraps today   due to time constraints with double device trials.                    OT Education - 05/06/21 1048     Education Details Pt/ family education for Flexitouch advanced sequential pneumatic compression device, or "pump", including how it works, indications, contraindications, precautions, care and use routine. Patient's questions were answered throughout trial and handouts and Internet resources given for reference.    Person(s) Educated Patient    Methods Explanation;Demonstration;Handout    Comprehension Verbalized understanding;Returned demonstration;Need further instruction                 OT Long Term Goals - 05/02/21 1220       OT LONG TERM GOAL #1   Title Pt will be able to apply knee length, multi-layer, short stretch compression wraps to one leg using correct gradient techniques with Max caregiver assistance to decrease limb volume, to decrease leg pain and limit infection risk, and to improve safe functional ambulation and mobility.    Baseline dependent    Time 4    Period Days    Status Not Met   Pt does not have caregiver who can assist her daily with short stretch, gradient compression wraps between visits. Without daily compression prognosis for limb volume reduction is poor. 05/02/21: Wraps applied at clinical visits only.   Target Date 07/20/21      OT LONG TERM GOAL #2   Title Pt will demonstrate understanding of lymphedema precautions  by being able to name and offer examples of 6 strategies using a printed reference for PRN.    Baseline Max A    Period Days    Status Not Met    Target Date 07/20/21      OT LONG TERM GOAL #3   Title Pt will achieve and sustain a least 85% compliance performing all daily LE self-care home program components throughout Intensive Phase CDT, including elevation when seated, recommended skin care regime,  lymphatic pumping ther ex, 23/7 compression wraps and simple self-MLD, with Max caregiver assistance to ensure optimal limb volume reduction, to limit infection risk and to limit LE progression.    Baseline dependent    Time 4    Period Weeks    Status Not Met   05/02/21: Pt unable to apply wraps, to perform skininspection and skin care to distal legs and feet due to limited ROM    and body habitus. Pt has not complied with recommendation for CG.   Target Date 07/20/21      OT LONG TERM GOAL #4   Title Pt will achieve at least 10%  limb volume reductions bilaterally during Intensive Phase CDT  to prevent re-accumulation of lymphatic congestion and progression of fibrosis, to limit infection risk, to improve functional ambulation and transfers, and  to improve functional performance of basic and instrumental ADLs, and to limit LE progression.    Baseline dependent    Time 12    Period Weeks    Status Partially Met   9:23/22 : 10TH VISIT LLE dec 4.2%; RLE DEC 1.3%.   Target Date 07/20/21      OT LONG TERM GOAL #5   Title By DC from OT Pt will be able to don and doff appropriate daytime compression garments and HOS devices with Max CG assistance to limit lymphatic re-accumulation and LE progression with before transitioning to self-management phase of CDT.    Baseline dependent    Time 12    Period Weeks    Status Not Met    Target Date 07/20/21      OT LONG TERM GOAL #6   Title Pt will be able to don and doff alternative adjustable compression leggings with modified independence (extra time) to limit limb swelling and infection risk. Pt does not have CG available to assist her with any other available compression modality.    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   Acute bronchitis 11/02/2016   Unstable angina ([place]) 77/82/4235   Chronic systolic heart failure ([place])    PVC's (premature ventricular contractions)    Ventricular fibrillation ([place]) 10/03/2015   Cardiac arrest ([place]) 10/03/2015   Essential hypertension 09/09/2015   Lymphedema 02/21/2015   Other specified hypothyroidism 02/21/2015    Ansel Bong, OT/L 05/06/2021, 1:18 PM  [place] MAIN [facility] SERVICES [address] [place], [place], [address] Phone: [phone]   Fax:  [phone]  Name: Lisa [name] MRN: [id number] Date of Birth: 1941-03-30

## 2021-05-06 NOTE — Therapy (Signed)
[place] MAIN [facility] SERVICES [address] [place], [place], [address] Phone: [phone]   Fax:  [phone]  Occupational Therapy Treatment  Patient Details  Name: Lisa [name] MRN: [id number] Date of Birth: 04-20-1941 No data recorded  Encounter Date: 05/06/2021   OT End of Session - 05/06/21 1030     Visit Number 11    Number of Visits 36    Date for OT Re-Evaluation 07/21/21    OT Start Time 1110    OT Stop Time 1210    OT Time Calculation (min) 60 min    Activity Tolerance Patient tolerated treatment well;No increased pain;Other (comment)   Pt limited by impaired functional mobility and transfers, and by LB weakness   Behavior During Therapy [facility] for tasks assessed/performed             Past Medical History:  Diagnosis Date   CAD in native artery 2017   stent to LAD   Cardiac arrest ([place]) 09/2015   CHF (congestive heart failure) (HCC)    CML (chronic myelocytic leukemia) (HCC)    Complication of anesthesia    Hx: UTI (urinary tract infection)    Hypertension    Melanoma ([place])    PONV (postoperative nausea and vomiting)    PVC (premature ventricular contraction)    HISTORY OF   Thyroid disease    V tach ([place])     Past Surgical History:  Procedure Laterality Date   ABDOMINAL HYSTERECTOMY     APPENDECTOMY     CARDIAC CATHETERIZATION N/A 10/03/2015   Procedure: Left Heart Cath and Coronary Angiography;  Surgeon: [name], MD;  Location: [place] [facility];  Service: Cardiovascular;  Laterality: N/A;   CARDIAC CATHETERIZATION N/A 10/07/2015   Procedure: Coronary Stent Intervention;  Surgeon: [name], MD;  Location: [place] [facility];  Service: Cardiovascular;  Laterality: N/A;   CARDIAC CATHETERIZATION  09/22/2019   EP IMPLANTABLE DEVICE N/A 01/20/2016   Procedure: BiV ICD Insertion CRT-D;  Surgeon: [name], MD;  Location: [place] [facility];  Service: Cardiovascular;  Laterality: N/A;    LEFT HEART CATH AND CORONARY ANGIOGRAPHY N/A 09/22/2019   Procedure: LEFT HEART CATH AND CORONARY ANGIOGRAPHY;  Surgeon: Martinique, Peter M, MD;  Location: [place] [facility];  Service: Cardiovascular;  Laterality: N/A;   TONSILLECTOMY     TONSILLECTOMY     TUBAL LIGATION      There were no vitals filed for this visit.   Subjective Assessment - 05/06/21 1035     Subjective  Lisa [name] presents for OT Rx visit 11/36 to address severe BLE lymphedema (I89.0). Pt denies lymphedema related pain in her legs this morning. Manufacturer's rep for [employer], Jerrell Mylar, is here today to assist w/ trial of basic lymphedema "pump" (Y5035) and Flexitouch advanced sequential pneumatic compression device ([id number]) .Pt reports that she currently has a basic compression pump  perscribed by a wound care provider, but she does not use it because it does not fit.    Pertinent History CKD, Stage L2X/N1, Chronic systolic congestive heart failure, severe, stage II-Mild stage III BLE lymphedema ( onset in 20's), hypothyroidism, HTN, Ventricular fibrillation, pacer regularly interrogated, hx skin breakdown on leg, chronic myologeneous leukemia, Hx R leg melanoma w/ wide excision, s/p cardiac arrest 2017    Limitations chronic leg swelling and pain, decreased balance, decreased A/PROM , decreased lower body strength (unable to lift legs), impaired endurance    Repetition Increases  Symptoms    Special Tests 12/11/20 FOTO 48/100. 05/02/21 FOTO: 48/100    Patient Stated Goals reduce leg swelling, improve functional mobility and safe ambulation, improve skin condition, have better systemic fluid balance,    Currently in Pain? No/denies    Pain Onset More than a month ago    Aggravating Factors  walking, standing,  transfers    Pain Relieving Factors elevation, MLD    Effect of Pain on Daily Activities chronic leg swelling and associated pain limits functional ambulation and mobility, contributes to decreased balance  and increased falls risk, contributes to increased infection risk, exacerbates decreased endurance and limits functional performance of basic and instrumental ADLs, productive activities and leisure pursuits, limits soial participation and impairs body image;                          OT Treatments/Exercises (OP) - 05/06/21 1040       ADLs   ADL Education Given Yes      Manual Therapy   Manual Therapy Edema management;Compression Bandaging    Edema Management SPCD trial with [employer] basic Entre and advanced Flexitouch devices to LL w/ 30-40 mmHg for    Compression Bandaging no compression wraps today   due to time constraints with double device trials.                    OT Education - 05/06/21 1048     Education Details Pt/ family education re differences , indications and contraindications for basic vs advanced sequential pneumatic compression devices, or "pumps". Pt trialed both devices.  Patient's questions were answered throughout session and handouts and Internet resources given for reference.    Person(s) Educated Patient    Methods Explanation;Demonstration;Handout    Comprehension Verbalized understanding;Returned demonstration;Need further instruction                 OT Long Term Goals - 05/02/21 1220       OT LONG TERM GOAL #1   Title Pt will be able to apply knee length, multi-layer, short stretch compression wraps to one leg using correct gradient techniques with Max caregiver assistance to decrease limb volume, to decrease leg pain and limit infection risk, and to improve safe functional ambulation and mobility.    Baseline dependent    Time 4    Period Days    Status Not Met   Pt does not have caregiver who can assist her daily with short stretch, gradient compression wraps between visits. Without daily compression prognosis for limb volume reduction is poor. 05/02/21: Wraps applied at clinical visits only.   Target Date  07/20/21      OT LONG TERM GOAL #2   Title Pt will demonstrate understanding of lymphedema precautions  by being able to name and offer examples of 6 strategies using a printed reference for PRN.    Baseline Max A    Period Days    Status Not Met    Target Date 07/20/21      OT LONG TERM GOAL #3   Title Pt will achieve and sustain a least 85% compliance performing all daily LE self-care home program components throughout Intensive Phase CDT, including elevation when seated, recommended skin care regime, lymphatic pumping ther ex, 23/7 compression wraps and simple self-MLD, with Max caregiver assistance to ensure optimal limb volume reduction, to limit infection risk and to limit LE progression.    Baseline dependent  Time 4    Period Weeks    Status Not Met   05/02/21: Pt unable to apply wraps, to perform skininspection and skin care to distal legs and feet due to limited ROM    and body habitus. Pt has not complied with recommendation for CG.   Target Date 07/20/21      OT LONG TERM GOAL #4   Title Pt will achieve at least 10%  limb volume reductions bilaterally during Intensive Phase CDT to prevent re-accumulation of lymphatic congestion and progression of fibrosis, to limit infection risk, to improve functional ambulation and transfers, and  to improve functional performance of basic and instrumental ADLs, and to limit LE progression.    Baseline dependent    Time 12    Period Weeks    Status Partially Met   9:23/22 : 10TH VISIT LLE dec 4.2%; RLE DEC 1.3%.   Target Date 07/20/21      OT LONG TERM GOAL #5   Title By DC from OT Pt will be able to don and doff appropriate daytime compression garments and HOS devices with Max CG assistance to limit lymphatic re-accumulation and LE progression with before transitioning to self-management phase of CDT.    Baseline dependent    Time 12    Period Weeks    Status Not Met    Target Date 07/20/21      OT LONG TERM GOAL #6   Title Pt will  be able to don and doff alternative adjustable compression leggings with modified independence (extra time) to limit limb swelling and infection risk. Pt does not have CG available to assist her with any other available compression modality.    Baseline dependent    Time 4    Period Weeks    Status On-going    Target Date 07/20/21                   Plan - 05/06/21 1333     Clinical Impression Statement Lisa [name] presents for OT Rx visit 11 to address severe, stage III,  BLE lymphedema. Pt's lower quadrant swelling extends from abdomen to toes. Pt's lymphedema was treated  by this therapiist with Complete Decongestive Therapy (CDT) previously, but care was interrupted by recurrent wounds and infection requiring Physician's care at the hospital wound care center. During the current episode of care Pt has undergone 6 weeks of conservative CDT consisting of manual lymphatic drainage (MLD),compression bandaging, skin care and therapeutic exercise. Pt also elevates legs as directed. Unfortunately significant symptopms of lymphedema persist and progress. Lisa [name] will benefit from daily use of a sequential pneumatic compression device, or lymphedema "pump", at home for optimal, ongoing lymphedema self care essential for limitting progression. Today she trialed both the [employer] basic compression "pump" , the Entre, (40 mmHg) and the advanced sequential pneumatic compression device, the Flexitouch,( 40 mmHg)  in clinic. The basic device trial failed after 15 minutes due to complaints of increased  foot , knee and leg pain. Before and after measurements at the abdomen increased from 130 cm to 132 cm after using the [place] basic device. Lisa [name]  completed the advanced Flexitouch trial without iinterruption. She denied increased lower extremity and lower quadrant pain while using device. The advanced Flexitouch device with BLE garments and truncal garment is medically necessary to assist  this patient with optimal daily lymphedema self-care over time at home. The advanced, 32-chamber Flexitouch is the only available sequential pneumatic compression device that  provides proximal to distal fluid return via regional lymph nodes, deep abdominal pathways and the thoracic duct to the heart. The basic pneumatic pump is not appropriate for this patient because it provides distal-to-proximal, retrograde massage mobilizing tissue fluid against back abruptly ending distal to the regional lymph nodes in the groin leaving a dense ring of protein rich fluid distal to the inguinal lymph nodes in the thigh. The Flexitouch pneumatic compression device is medically necessary for optimal long term LE self-management to limit progression and to limit further functional decline.    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 Pt is unable to reach feet to apply essential multi-layer compression wraps .   Clinical Decision Making Several treatment options, min-mod task modification necessary    Comorbidities Affecting Occupational Performance: Presence of comorbidities impacting occupational performance    Comorbidities impacting occupational performance description: see Subjective    Modification or Assistance to Complete Evaluation  Min-Moderate modification of tasks or assist with assess  necessary to complete eval    OT Frequency 2x / week    OT Duration 12 weeks   and ongoing PRN for management phase CDT support   OT Treatment/Interventions Self-care/ADL training;DME and/or AE instruction;Manual lymph drainage;Therapeutic activities;Compression bandaging;Therapeutic exercise;Coping strategies training;Other (comment);Functional Mobility Training;Manual Therapy;Patient/family education   skin care with low ph castor oil during MLD   Plan Fit Pt w custom, knee length, adjustable Circ[employer] compression leggings- calibrate to ccl 3. Provide MLD and compression bandaging until compression garments are fitted and Pt is able to don and doff with modified independence (extra time)    Recommended Other Services Pt will benefit from PT to improve balance, increase LB strength, increase endurance , and improve functional ambulation and mobility. Screening request submitted 12/11/20. Sequential pneumatic compression device contraindicated for this patient due to extensive cardiac precautions.    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   Acute bronchitis 11/02/2016   Unstable angina ([place]) 84/69/6295   Chronic systolic heart failure ([place])    PVC's (premature ventricular contractions)    Ventricular fibrillation ([place]) 10/03/2015   Cardiac arrest ([place]) 10/03/2015   Essential hypertension 09/09/2015    Lymphedema 02/21/2015   Other specified hypothyroidism 02/21/2015   Andrey Spearman, Lisa, OTR/L, CLT-LANA 05/06/21 3:26 PM   [place] MAIN [facility] SERVICES [address] [place], [place], [address] Phone: [phone]   Fax:  [phone]  Name: HARTLEY WYKE MRN: [id number]  Date of Birth: Apr 19, 1941

## 2021-05-07 NOTE — Progress Notes (Signed)
Remote ICD transmission.   

## 2021-05-09 ENCOUNTER — Ambulatory Visit: Payer: Medicare Other | Admitting: Occupational Therapy

## 2021-05-12 ENCOUNTER — Other Ambulatory Visit: Payer: Self-pay

## 2021-05-12 ENCOUNTER — Ambulatory Visit: Payer: Medicare Other | Attending: Family Medicine | Admitting: Occupational Therapy

## 2021-05-12 DIAGNOSIS — I890 Lymphedema, not elsewhere classified: Secondary | ICD-10-CM | POA: Insufficient documentation

## 2021-05-12 NOTE — Therapy (Signed)
[place] MAIN [facility] SERVICES [address] [place], [place], [address] Phone: [phone]   Fax:  [phone]  Occupational Therapy Treatment  Patient Details  Name: Lisa [name] MRN: [id number] Date of Birth: [birth year] No data recorded  Encounter Date: 05/12/2021   OT End of Session - 05/12/21 1023     Visit Number 12    Number of Visits 36    Date for OT Re-Evaluation 07/21/21    OT Start Time 1006    OT Stop Time 1110    OT Time Calculation (min) 64 min    Equipment Utilized During Treatment basic Nurse, adult advanced Flexitouch sequential pneumatic compression device    Activity Tolerance Patient tolerated treatment well;Other (comment)   Poor tolerance for [facility] device - caused pain in L foot, ankle and leg/knee   Behavior During Therapy [facility] for tasks assessed/performed             Past Medical History:  Diagnosis Date   CAD in native artery 2017   stent to LAD   Cardiac arrest ([place]) 09/2015   CHF (congestive heart failure) (HCC)    CML (chronic myelocytic leukemia) (HCC)    Complication of anesthesia    Hx: UTI (urinary tract infection)    Hypertension    Melanoma (HCC)    PONV (postoperative nausea and vomiting)    PVC (premature ventricular contraction)    HISTORY OF   Thyroid disease    V tach ([place])     Past Surgical History:  Procedure Laterality Date   ABDOMINAL HYSTERECTOMY     APPENDECTOMY     CARDIAC CATHETERIZATION N/A 10/03/2015   Procedure: Left Heart Cath and Coronary Angiography;  Surgeon: [name], MD;  Location: [place] [facility];  Service: Cardiovascular;  Laterality: N/A;   CARDIAC CATHETERIZATION N/A 10/07/2015   Procedure: Coronary Stent Intervention;  Surgeon: [name], MD;  Location: [place] [facility];  Service: Cardiovascular;  Laterality: N/A;   CARDIAC CATHETERIZATION  09/22/2019   EP IMPLANTABLE DEVICE N/A 01/20/2016   Procedure:  BiV ICD Insertion CRT-D;  Surgeon: [name], MD;  Location: [place] [facility];  Service: Cardiovascular;  Laterality: N/A;   LEFT HEART CATH AND CORONARY ANGIOGRAPHY N/A 09/22/2019   Procedure: LEFT HEART CATH AND CORONARY ANGIOGRAPHY;  Surgeon: Martinique, Peter M, MD;  Location: [place] [facility];  Service: Cardiovascular;  Laterality: N/A;   TONSILLECTOMY     TONSILLECTOMY     TUBAL LIGATION      There were no vitals filed for this visit.   Subjective Assessment - 05/12/21 1147     Subjective  Lisa [name] presents for OT Rx visit 12/36 to address severe BLE lymphedema (I89.0). Pt denies lymphedema related pain in her legs this morning. Pt is more SOB when walking from waiting area to LE clinic this morning. She reports that she hasn't taken her diuretic in several days. "It's not usually this bad." OT guided Pt in diaphragmatic br4athing technique to improve respiration and limit hyperventilation. Pt agreed to take transport wc with volunteer back up to lobby after session.    Pertinent History CKD, Stage R4W/N4, Chronic systolic congestive heart failure, severe, stage II-Mild stage III BLE lymphedema ( onset in 20's), hypothyroidism, HTN, Ventricular fibrillation, pacer regularly interrogated, hx skin breakdown on leg, chronic myologeneous leukemia, Hx R leg melanoma w/ wide excision, s/p cardiac arrest 2017    Limitations chronic leg  swelling and pain, decreased balance, decreased A/PROM , decreased lower body strength (unable to lift legs), impaired endurance, intermittent lymphorrhea, hx recurrent BLE cellulitis and non-pressure related wounds    Repetition Increases Symptoms    Special Tests 12/11/20 FOTO 48/100. 05/02/21 FOTO: 48/100    Patient Stated Goals reduce leg swelling, improve functional mobility and safe ambulation, improve skin condition, have better systemic fluid balance,    Pain Onset More than a month ago                          OT  Treatments/Exercises (OP) - 05/12/21 1150       ADLs   ADL Education Given Yes      Manual Therapy   Manual Therapy Edema management;Manual Lymphatic Drainage (MLD);Compression Bandaging    Manual Lymphatic Drainage (MLD) MLD to LLE/LLW using typical, non-cancer related sequences, including J strokes to terminus only ( no neck strokes in keeping w thyroid precautions), functional inguinal LN and J strokes to thigh first, then medial knee. and lastly leg and foot, before returning 3 x from distal to proximal completing manual session with clabicals..    Compression Bandaging Applied multi layer, gradient compression wraps to LLE only, omitting foot. No wraps to RLE as Pt does not bring enough supplied to clinic to wrap biolateral legs. It's not a problem today, especialy in lieu of her SOB.                    OT Education - 05/12/21 1155     Education Details Pt edu for diaphragmatic breathing to improve respiration and to limit risk of hyperventilation. Pt edu re lymphatic cysts and lymphorrhea, both of which she presents with every session.    Person(s) Educated Patient    Methods Explanation;Demonstration;Handout    Comprehension Verbalized understanding;Returned demonstration;Need further instruction                 OT Long Term Goals - 05/02/21 1220       OT LONG TERM GOAL #1   Title Pt will be able to apply knee length, multi-layer, short stretch compression wraps to one leg using correct gradient techniques with Max caregiver assistance to decrease limb volume, to decrease leg pain and limit infection risk, and to improve safe functional ambulation and mobility.    Baseline dependent    Time 4    Period Days    Status Not Met   Pt does not have caregiver who can assist her daily with short stretch, gradient compression wraps between visits. Without daily compression prognosis for limb volume reduction is poor. 05/02/21: Wraps applied at clinical visits only.    Target Date 07/20/21      OT LONG TERM GOAL #2   Title Pt will demonstrate understanding of lymphedema precautions  by being able to name and offer examples of 6 strategies using a printed reference for PRN.    Baseline Max A    Period Days    Status Not Met    Target Date 07/20/21      OT LONG TERM GOAL #3   Title Pt will achieve and sustain a least 85% compliance performing all daily LE self-care home program components throughout Intensive Phase CDT, including elevation when seated, recommended skin care regime, lymphatic pumping ther ex, 23/7 compression wraps and simple self-MLD, with Max caregiver assistance to ensure optimal limb volume reduction, to limit infection risk and to limit LE progression.  Baseline dependent    Time 4    Period Weeks    Status Not Met   05/02/21: Pt unable to apply wraps, to perform skininspection and skin care to distal legs and feet due to limited ROM    and body habitus. Pt has not complied with recommendation for CG.   Target Date 07/20/21      OT LONG TERM GOAL #4   Title Pt will achieve at least 10%  limb volume reductions bilaterally during Intensive Phase CDT to prevent re-accumulation of lymphatic congestion and progression of fibrosis, to limit infection risk, to improve functional ambulation and transfers, and  to improve functional performance of basic and instrumental ADLs, and to limit LE progression.    Baseline dependent    Time 12    Period Weeks    Status Partially Met   9:23/22 : 10TH VISIT LLE dec 4.2%; RLE DEC 1.3%.   Target Date 07/20/21      OT LONG TERM GOAL #5   Title By DC from OT Pt will be able to don and doff appropriate daytime compression garments and HOS devices with Max CG assistance to limit lymphatic re-accumulation and LE progression with before transitioning to self-management phase of CDT.    Baseline dependent    Time 12    Period Weeks    Status Not Met    Target Date 07/20/21      OT LONG TERM GOAL #6    Title Pt will be able to don and doff alternative adjustable compression leggings with modified independence (extra time) to limit limb swelling and infection risk. Pt does not have CG available to assist her with any other available compression modality.    Baseline dependent    Time 4    Period Weeks    Status On-going    Target Date 07/20/21                   Plan - 05/12/21 1147     Clinical Impression Statement Lisa [name] presents for OT Rx visit 12 to address severe, stage III,  BLE lymphedema. Lisa [name] is more SOB this morning than usual, most likely due to fluid overload as she reports she has not taken her diuretic in several days. OT explained function and importance of this medication and suggested she resume her perscriptions according to her doctors' instruction. OT encouraged Pt to contact her PCP if SOB persists or worsens. 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 Pt has not used compression on her legs in over a week.Provided MLD and simultaneous skin care to LLE using castor oil as established.  Applied multi layer, gradient compression wraps to LLE only, omitting foot b/c . Pt did not bring enough bandages for both legs below the knees. This is  not a problem today, especialy in lieu of her SOB. Pt in process of scheduling CircAid measurements with DME vendor. Flexitouch trial is completed and manufacture's rep is working on Loss adjuster, chartered. Cont as per POC. Transition to self care phase of CDT as soon as CircAid's are fit and , hopefully, Pt receives Flexitouch for home use.    OT Occupational Profile and History Comprehensive Assessment- Review of records and extensive additional review of physical, cognitive, psychosocial history related to current functional performance    Occupational performance deficits (Please refer to evaluation for details): ADL's;IADL's;Education;Leisure;Social Participation;Other;Work   Archivist / Function / Physical Skills ADL;Decreased knowledge  of use of DME;Balance;Pain;Edema;ROM;IADL;Endurance;Scar mobility;Mobility;Decreased knowledge of precautions;Skin integrity;Strength    Rehab Potential Fair   Decreased from Good to Fair-Poor with no caregiver to assist with CDT home program between visits. Pt is unable to reach feet to apply essential multi-layer compression wraps .   Clinical Decision Making Several treatment options, min-mod task modification necessary    Comorbidities Affecting Occupational Performance: Presence of comorbidities impacting occupational performance    Comorbidities impacting occupational performance description: see Subjective    Modification or Assistance to Complete Evaluation  Min-Moderate modification of tasks or assist with assess necessary to complete eval    OT Frequency 2x / week    OT Duration 12 weeks   and ongoing PRN for management phase CDT support   OT Treatment/Interventions Self-care/ADL training;DME and/or AE instruction;Manual lymph drainage;Therapeutic activities;Compression bandaging;Therapeutic exercise;Coping strategies training;Other (comment);Functional Mobility Training;Manual Therapy;Patient/family education   skin care with low ph castor oil during MLD   Plan Fit Pt w custom, knee length, adjustable CircAid compression leggings- calibrate to ccl 3. Provide MLD and compression bandaging until compression garments are fitted and Pt is able to don and doff with modified independence (extra time)    Recommended Other Services Pt will benefit from PT to improve balance, increase LB strength, increase endurance , and improve functional ambulation and mobility. Screening request submitted 12/11/20. Pt will benefit from advanced sequential pneumatic compression device, [employer] Flexitouch, to limit lymphedema progression and to better manage severe , stage III lymphedema at home over time since she is unable to perform self MLD 2/2  comorbidities    Consulted and Agree with Plan of Care Patient             Patient will benefit from skilled therapeutic intervention in order to improve the following deficits and impairments:   Body Structure / Function / Physical Skills: ADL, Decreased knowledge of use of DME, Balance, Pain, Edema, ROM, IADL, Endurance, Scar mobility, Mobility, Decreased knowledge of precautions, Skin integrity, Strength       Visit Diagnosis: Lymphedema, not elsewhere classified    Problem List Patient Active Problem List   Diagnosis Date Noted   Sustained VT (ventricular tachycardia) 09/21/2019   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 [place], [place], [address] Phone: [phone]   Fax:  [phone]  Name: Lisa [name] MRN: [id number] Date of Birth: 1941/05/04

## 2021-05-12 NOTE — Patient Instructions (Signed)

## 2021-05-16 ENCOUNTER — Ambulatory Visit: Payer: Medicare Other | Admitting: Occupational Therapy

## 2021-05-16 ENCOUNTER — Other Ambulatory Visit: Payer: Self-pay

## 2021-05-16 DIAGNOSIS — I890 Lymphedema, not elsewhere classified: Secondary | ICD-10-CM

## 2021-05-16 NOTE — Patient Instructions (Signed)

## 2021-05-16 NOTE — Therapy (Signed)
[place] MAIN [facility] SERVICES [address] [place], [place], [address] Phone: [phone]   Fax:  [phone]  Occupational Therapy Treatment  Patient Details  Name: Lisa [name] MRN: [id number] Date of Birth: [birth year] No data recorded  Encounter Date: 05/16/2021   OT End of Session - 05/16/21 1107     Visit Number 13    Number of Visits 36    Date for OT Re-Evaluation 07/21/21    OT Start Time 0905    OT Stop Time 1005    OT Time Calculation (min) 60 min    Equipment Utilized During Treatment basic Nurse, adult advanced Flexitouch sequential pneumatic compression device    Activity Tolerance Patient tolerated treatment well;Other (comment)   Poor tolerance for [facility] device - caused pain in L foot, ankle and leg/knee   Behavior During Therapy [facility] for tasks assessed/performed             Past Medical History:  Diagnosis Date   CAD in native artery 2017   stent to LAD   Cardiac arrest ([place]) 09/2015   CHF (congestive heart failure) (HCC)    CML (chronic myelocytic leukemia) (HCC)    Complication of anesthesia    Hx: UTI (urinary tract infection)    Hypertension    Melanoma (HCC)    PONV (postoperative nausea and vomiting)    PVC (premature ventricular contraction)    HISTORY OF   Thyroid disease    V tach ([place])     Past Surgical History:  Procedure Laterality Date   ABDOMINAL HYSTERECTOMY     APPENDECTOMY     CARDIAC CATHETERIZATION N/A 10/03/2015   Procedure: Left Heart Cath and Coronary Angiography;  Surgeon: [name], MD;  Location: [place] [facility];  Service: Cardiovascular;  Laterality: N/A;   CARDIAC CATHETERIZATION N/A 10/07/2015   Procedure: Coronary Stent Intervention;  Surgeon: [name], MD;  Location: [place] [facility];  Service: Cardiovascular;  Laterality: N/A;   CARDIAC CATHETERIZATION  09/22/2019   EP IMPLANTABLE DEVICE N/A 01/20/2016   Procedure:  BiV ICD Insertion CRT-D;  Surgeon: [name], MD;  Location: [place] [facility];  Service: Cardiovascular;  Laterality: N/A;   LEFT HEART CATH AND CORONARY ANGIOGRAPHY N/A 09/22/2019   Procedure: LEFT HEART CATH AND CORONARY ANGIOGRAPHY;  Surgeon: Martinique, Peter M, MD;  Location: [place] [facility];  Service: Cardiovascular;  Laterality: N/A;   TONSILLECTOMY     TONSILLECTOMY     TUBAL LIGATION      There were no vitals filed for this visit.   Subjective Assessment - 05/16/21 1108     Subjective  Lisa [name] presents for OT Rx visit 13//36 to address severe BLE lymphedema (I89.0). Pt reports SOB noted last visit resolved after taking her "fluid pill". Pt tells me she has not yet  been to DME provider for custom adjustable Circ Aid measurements. "I just didnt feel like it."    Pertinent History CKD, Stage F7T/K2, Chronic systolic congestive heart failure, severe, stage II-Mild stage III BLE lymphedema ( onset in 20's), hypothyroidism, HTN, Ventricular fibrillation, pacer regularly interrogated, hx skin breakdown on leg, chronic myologeneous leukemia, Hx R leg melanoma w/ wide excision, s/p cardiac arrest 2017    Limitations chronic leg swelling and pain, decreased balance, decreased A/PROM , decreased lower body strength (unable to lift legs), impaired endurance, intermittent lymphorrhea, hx recurrent BLE cellulitis and non-pressure related wounds  Repetition Increases Symptoms    Special Tests 12/11/20 FOTO 48/100. 05/02/21 FOTO: 48/100    Patient Stated Goals reduce leg swelling, improve functional mobility and safe ambulation, improve skin condition, have better systemic fluid balance,    Pain Onset More than a month ago                          OT Treatments/Exercises (OP) - 05/16/21 1109       ADLs   ADL Education Given Yes      Manual Therapy   Manual Therapy Edema management;Manual Lymphatic Drainage (MLD);Compression Bandaging    Edema Management Completed  anatomical measutrements for BLE custom CircAid adjustable leggings. Faxed to vendor after session    Compression Bandaging Applied multi layer, gradient compression wraps to LLE only, omitting foot. No wraps to RLE as Pt does not bring enough supplied to clinic to wrap biolateral legs. It's not a problem today, especialy in lieu of her SOB.                    OT Education - 05/16/21 1110     Education Details Continued Pt/ CG edu for lymphedema self care  and home program throughout session. Topics include multilayer, gradient compression wrapping, simple self-MLD, therapeutic lymphatic pumping exercises, skin/nail care, risk reduction factors and LE precautions, compression garments/recommendations and wear and care schedule and compression garment donning / doffing using assistive devices. All questions answered to the Pt's satisfaction, and Pt demonstrates understanding by report. Pt edu for [employer] expandable, adjustable shoes.    Person(s) Educated Patient    Methods Explanation;Demonstration;Handout    Comprehension Verbalized understanding;Returned demonstration;Need further instruction                 OT Long Term Goals - 05/02/21 1220       OT LONG TERM GOAL #1   Title Pt will be able to apply knee length, multi-layer, short stretch compression wraps to one leg using correct gradient techniques with Max caregiver assistance to decrease limb volume, to decrease leg pain and limit infection risk, and to improve safe functional ambulation and mobility.    Baseline dependent    Time 4    Period Days    Status Not Met   Pt does not have caregiver who can assist her daily with short stretch, gradient compression wraps between visits. Without daily compression prognosis for limb volume reduction is poor. 05/02/21: Wraps applied at clinical visits only.   Target Date 07/20/21      OT LONG TERM GOAL #2   Title Pt will demonstrate understanding of lymphedema precautions  by  being able to name and offer examples of 6 strategies using a printed reference for PRN.    Baseline Max A    Period Days    Status Not Met    Target Date 07/20/21      OT LONG TERM GOAL #3   Title Pt will achieve and sustain a least 85% compliance performing all daily LE self-care home program components throughout Intensive Phase CDT, including elevation when seated, recommended skin care regime, lymphatic pumping ther ex, 23/7 compression wraps and simple self-MLD, with Max caregiver assistance to ensure optimal limb volume reduction, to limit infection risk and to limit LE progression.    Baseline dependent    Time 4    Period Weeks    Status Not Met   05/02/21: Pt unable to apply wraps, to perform  skininspection and skin care to distal legs and feet due to limited ROM    and body habitus. Pt has not complied with recommendation for CG.   Target Date 07/20/21      OT LONG TERM GOAL #4   Title Pt will achieve at least 10%  limb volume reductions bilaterally during Intensive Phase CDT to prevent re-accumulation of lymphatic congestion and progression of fibrosis, to limit infection risk, to improve functional ambulation and transfers, and  to improve functional performance of basic and instrumental ADLs, and to limit LE progression.    Baseline dependent    Time 12    Period Weeks    Status Partially Met   9:23/22 : 10TH VISIT LLE dec 4.2%; RLE DEC 1.3%.   Target Date 07/20/21      OT LONG TERM GOAL #5   Title By DC from OT Pt will be able to don and doff appropriate daytime compression garments and HOS devices with Max CG assistance to limit lymphatic re-accumulation and LE progression with before transitioning to self-management phase of CDT.    Baseline dependent    Time 12    Period Weeks    Status Not Met    Target Date 07/20/21      OT LONG TERM GOAL #6   Title Pt will be able to don and doff alternative adjustable compression leggings with modified independence (extra time) to  limit limb swelling and infection risk. Pt does not have CG available to assist her with any other available compression modality.    Baseline dependent    Time 4    Period Weeks    Status On-going    Target Date 07/20/21                   Plan - 05/16/21 1111     Clinical Impression Statement Completed BLE anatomical measurements for custom, knee length , adjustable , CircAids, adjustable Velcro wrap stlyle , compression garment alterantives. Reapplied BLE wraps as established. Cont as per POC.    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 Pt is unable to reach feet to apply essential multi-layer compression wraps .   Clinical Decision Making Several treatment options, min-mod task modification necessary    Comorbidities Affecting Occupational Performance: Presence of comorbidities impacting occupational performance    Comorbidities impacting occupational performance description: see Subjective    Modification or Assistance to Complete Evaluation  Min-Moderate modification of tasks or assist with assess necessary to complete eval    OT Frequency 2x / week    OT Duration 12 weeks   and ongoing PRN for management phase CDT support   OT Treatment/Interventions Self-care/ADL training;DME and/or AE instruction;Manual lymph drainage;Therapeutic  activities;Compression bandaging;Therapeutic exercise;Coping strategies training;Other (comment);Functional Mobility Training;Manual Therapy;Patient/family education   skin care with low ph castor oil during MLD   Plan Fit Pt w custom, knee length, adjustable CircAid compression leggings- calibrate to ccl 3. Provide MLD and compression bandaging until compression garments are fitted and Pt is able to don and  doff with modified independence (extra time)    Recommended Other Services Pt will benefit from PT to improve balance, increase LB strength, increase endurance , and improve functional ambulation and mobility. Screening request submitted 12/11/20. Pt will benefit from advanced sequential pneumatic compression device, [employer] Flexitouch, to limit lymphedema progression and to better manage severe , stage III lymphedema at home over time since she is unable to perform self MLD 2/2 comorbidities    Consulted and Agree with Plan of Care Patient             Patient will benefit from skilled therapeutic intervention in order to improve the following deficits and impairments:   Body Structure / Function / Physical Skills: ADL, Decreased knowledge of use of DME, Balance, Pain, Edema, ROM, IADL, Endurance, Scar mobility, Mobility, Decreased knowledge of precautions, Skin integrity, Strength       Visit Diagnosis: Lymphedema, not elsewhere classified    Problem List Patient Active Problem List   Diagnosis Date Noted   Sustained VT (ventricular tachycardia) 09/21/2019   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 [place], [place], [address] Phone: [phone]   Fax:  [phone]  Name: Lisa [name] MRN: [id number] Date of Birth: 1940-08-30

## 2021-05-19 ENCOUNTER — Ambulatory Visit (INDEPENDENT_AMBULATORY_CARE_PROVIDER_SITE_OTHER): Payer: Medicare Other

## 2021-05-19 DIAGNOSIS — Z95810 Presence of automatic (implantable) cardiac defibrillator: Secondary | ICD-10-CM | POA: Diagnosis not present

## 2021-05-19 DIAGNOSIS — I5022 Chronic systolic (congestive) heart failure: Secondary | ICD-10-CM | POA: Diagnosis not present

## 2021-05-20 ENCOUNTER — Telehealth: Payer: Self-pay

## 2021-05-20 ENCOUNTER — Other Ambulatory Visit: Payer: Self-pay

## 2021-05-20 ENCOUNTER — Ambulatory Visit: Payer: Medicare Other | Admitting: Occupational Therapy

## 2021-05-20 DIAGNOSIS — I890 Lymphedema, not elsewhere classified: Secondary | ICD-10-CM

## 2021-05-20 NOTE — Progress Notes (Signed)
EPIC Encounter for ICM Monitoring  Patient Name: Lisa [name] is a 80 y.o. female Date: 05/20/2021 Primary Care Physican: [name], MD Primary Cardiologist: Harrington Challenger Electrophysiologist: Santina Evans Pacing: 98.2%         03/25/2021 Office Weight: 260 lbs                                                            Attempted call to patient and unable to reach.  Left detailed message per DPR regarding transmission. Transmission reviewed.    Optivol thoracic impedance suggesting possible ongoing fluid accumulation starting 9/26 but trending back to baseline.  Fluid index > normal threshold starting 8/27.   Prescribed:  Torsemide Take 20 mg by mouth as directed. two days on one day off with potassium Potassium 10 mEq Take 10 mEq by mouth every other day. Per patient taking 2 tablets of 10 mEq to equal 20 mEq.   Labs: 04/04/2021 Creatinine 1.41, BUN 20, Potassium 4.1, Sodium 139, GFR 38 01/02/2021 Creatinine 1.44, BUN 20, Potassium 3.9, Sodium 142, GFR 37 11/16/2020 Creatinine 1.56, BUN 20, Potassium 4.6, Sodium 140, GFR 34 09/13/2020 Creatinine 1.32, BUN 21, Potassium 3.8, Sodium 141, GFR 41 A complete set of results can be found in Results Review.   Recommendations:  Left voice mail with ICM number and encouraged to call if experiencing any fluid symptoms.   Follow-up plan: ICM clinic phone appointment on 05/26/2021 to recheck fluid levels   91 day device clinic remote transmission 07/30/2021.     EP/Cardiology Office Visits: 06/06/2021 with Dr. Harrington Challenger.  Recall 03/21/2022 with Dr Lovena Le.   Copy of ICM check sent to Dr. Lovena Le.  Will send to Dr Harrington Challenger for review and recommendations if patient is reached.    3 month ICM trend: 05/19/2021.    1 Year ICM trend:       [name], RN 05/20/2021 8:52 AM

## 2021-05-20 NOTE — Telephone Encounter (Signed)
Remote ICM transmission received.  Attempted call to patient regarding ICM remote transmission and left detailed message per DPR.  Advised to return call for any fluid symptoms or questions. Next ICM remote transmission scheduled 05/26/2021.    

## 2021-05-21 NOTE — Therapy (Signed)
[place] MAIN [facility] SERVICES [address] [place], [place], [address] Phone: [phone]   Fax:  [phone]  Occupational Therapy Treatment  Patient Details  Name: Lisa [name] MRN: [id number] Date of Birth: [birth year] No data recorded  Encounter Date: 05/20/2021   OT End of Session - 05/20/21 0931     Visit Number 14    Number of Visits 36    Date for OT Re-Evaluation 07/21/21    OT Start Time 1110    OT Stop Time 1210    OT Time Calculation (min) 60 min    Equipment Utilized During Treatment basic Nurse, adult advanced Flexitouch sequential pneumatic compression device    Activity Tolerance Patient tolerated treatment well;Other (comment)   Poor tolerance for [facility] device - caused pain in L foot, ankle and leg/knee   Behavior During Therapy [facility] for tasks assessed/performed             Past Medical History:  Diagnosis Date   CAD in native artery 2017   stent to LAD   Cardiac arrest ([place]) 09/2015   CHF (congestive heart failure) (HCC)    CML (chronic myelocytic leukemia) (HCC)    Complication of anesthesia    Hx: UTI (urinary tract infection)    Hypertension    Melanoma (HCC)    PONV (postoperative nausea and vomiting)    PVC (premature ventricular contraction)    HISTORY OF   Thyroid disease    V tach ([place])     Past Surgical History:  Procedure Laterality Date   ABDOMINAL HYSTERECTOMY     APPENDECTOMY     CARDIAC CATHETERIZATION N/A 10/03/2015   Procedure: Left Heart Cath and Coronary Angiography;  Surgeon: [name], MD;  Location: [place] [facility];  Service: Cardiovascular;  Laterality: N/A;   CARDIAC CATHETERIZATION N/A 10/07/2015   Procedure: Coronary Stent Intervention;  Surgeon: [name], MD;  Location: [place] [facility];  Service: Cardiovascular;  Laterality: N/A;   CARDIAC CATHETERIZATION  09/22/2019   EP IMPLANTABLE DEVICE N/A 01/20/2016    Procedure: BiV ICD Insertion CRT-D;  Surgeon: [name], MD;  Location: [place] [facility];  Service: Cardiovascular;  Laterality: N/A;   LEFT HEART CATH AND CORONARY ANGIOGRAPHY N/A 09/22/2019   Procedure: LEFT HEART CATH AND CORONARY ANGIOGRAPHY;  Surgeon: Martinique, Peter M, MD;  Location: [place] [facility];  Service: Cardiovascular;  Laterality: N/A;   TONSILLECTOMY     TONSILLECTOMY     TUBAL LIGATION      There were no vitals filed for this visit.   Subjective Assessment - 05/21/21 0942     Subjective  Lisa [name] presents for OT Rx visit 14//36 to address severe BLE lymphedema (I89.0). Pt  presents without compression wraps in place . She presents with mild lymphorrhea bilaterally below the knees/    Pertinent History CKD, Stage D2K/G2, Chronic systolic congestive heart failure, severe, stage II-Mild stage III BLE lymphedema ( onset in 20's), hypothyroidism, HTN, Ventricular fibrillation, pacer regularly interrogated, hx skin breakdown on leg, chronic myologeneous leukemia, Hx R leg melanoma w/ wide excision, s/p cardiac arrest 2017    Limitations chronic leg swelling and pain, decreased balance, decreased A/PROM , decreased lower body strength (unable to lift legs), impaired endurance, intermittent lymphorrhea, hx recurrent BLE cellulitis and non-pressure related wounds    Repetition Increases Symptoms    Special Tests 12/11/20 FOTO 48/100. 05/02/21 FOTO: 48/100    Patient  Stated Goals reduce leg swelling, improve functional mobility and safe ambulation, improve skin condition, have better systemic fluid balance,    Pain Onset More than a month ago                          OT Treatments/Exercises (OP) - 05/21/21 0944       ADLs   ADL Education Given Yes      Manual Therapy   Manual Therapy Edema management;Manual Lymphatic Drainage (MLD);Compression Bandaging    Manual Lymphatic Drainage (MLD) MLD to LLE/LLW using typical, non-cancer related sequences,  including J strokes to terminus only ( no neck strokes in keeping w thyroid precautions), functional inguinal LN and J strokes to thigh first, then medial knee. and lastly leg and foot, before returning 3 x from distal to proximal completing manual session with clabicals..    Compression Bandaging Applied multi layer, gradient compression wraps to LLE only, omitting foot. No wraps to RLE as Pt does not bring enough supplied to clinic to wrap biolateral legs. It's not a problem today, especialy in lieu of her SOB.                    OT Education - 05/21/21 0945     Education Details Continued Pt/ CG edu for lymphedema self care  and home program throughout session. Topics include multilayer, gradient compression wrapping, simple self-MLD, therapeutic lymphatic pumping exercises, skin/nail care, risk reduction factors and LE precautions, compression garments/recommendations and wear and care schedule and compression garment donning / doffing using assistive devices. All questions answered to the Pt's satisfaction, and Pt demonstrates understanding by report. Pt edu for Pandere expandable, adjustable shoes.    Person(s) Educated Patient    Methods Explanation;Demonstration;Handout    Comprehension Verbalized understanding;Returned demonstration;Need further instruction                 OT Long Term Goals - 05/02/21 1220       OT LONG TERM GOAL #1   Title Pt will be able to apply knee length, multi-layer, short stretch compression wraps to one leg using correct gradient techniques with Max caregiver assistance to decrease limb volume, to decrease leg pain and limit infection risk, and to improve safe functional ambulation and mobility.    Baseline dependent    Time 4    Period Days    Status Not Met   Pt does not have caregiver who can assist her daily with short stretch, gradient compression wraps between visits. Without daily compression prognosis for limb volume reduction is  poor. 05/02/21: Wraps applied at clinical visits only.   Target Date 07/20/21      OT LONG TERM GOAL #2   Title Pt will demonstrate understanding of lymphedema precautions  by being able to name and offer examples of 6 strategies using a printed reference for PRN.    Baseline Max A    Period Days    Status Not Met    Target Date 07/20/21      OT LONG TERM GOAL #3   Title Pt will achieve and sustain a least 85% compliance performing all daily LE self-care home program components throughout Intensive Phase CDT, including elevation when seated, recommended skin care regime, lymphatic pumping ther ex, 23/7 compression wraps and simple self-MLD, with Max caregiver assistance to ensure optimal limb volume reduction, to limit infection risk and to limit LE progression.    Baseline dependent  Time 4    Period Weeks    Status Not Met   05/02/21: Pt unable to apply wraps, to perform skininspection and skin care to distal legs and feet due to limited ROM    and body habitus. Pt has not complied with recommendation for CG.   Target Date 07/20/21      OT LONG TERM GOAL #4   Title Pt will achieve at least 10%  limb volume reductions bilaterally during Intensive Phase CDT to prevent re-accumulation of lymphatic congestion and progression of fibrosis, to limit infection risk, to improve functional ambulation and transfers, and  to improve functional performance of basic and instrumental ADLs, and to limit LE progression.    Baseline dependent    Time 12    Period Weeks    Status Partially Met   9:23/22 : 10TH VISIT LLE dec 4.2%; RLE DEC 1.3%.   Target Date 07/20/21      OT LONG TERM GOAL #5   Title By DC from OT Pt will be able to don and doff appropriate daytime compression garments and HOS devices with Max CG assistance to limit lymphatic re-accumulation and LE progression with before transitioning to self-management phase of CDT.    Baseline dependent    Time 12    Period Weeks    Status Not Met     Target Date 07/20/21      OT LONG TERM GOAL #6   Title Pt will be able to don and doff alternative adjustable compression leggings with modified independence (extra time) to limit limb swelling and infection risk. Pt does not have CG available to assist her with any other available compression modality.    Baseline dependent    Time 4    Period Weeks    Status On-going    Target Date 07/20/21                   Plan - 05/20/21 0933     Clinical Impression Statement BLE presenting with mild lymphorrhea and skin redness and maceration. Pt encouraged to keep these areas very clean and bring non stick pads to clinic with paper tape to use under wraps since the LE clinic is not stocked with wound care supplies. Provided LLE MLD and reapplied BLE  mul;tilayer compression wrapsas established. Should lymphorrhea and skin condition    worsen we'll refer back to the wound center to limit infection risk.  Cont as per POC.    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 Pt is unable to reach feet to apply essential multi-layer compression wraps .   Clinical Decision Making Several treatment options, min-mod task modification necessary    Comorbidities Affecting Occupational Performance: Presence of comorbidities impacting occupational performance    Comorbidities  impacting occupational performance description: see Subjective    Modification or Assistance to Complete Evaluation  Min-Moderate modification of tasks or assist with assess necessary to complete eval    OT Frequency 2x / week    OT Duration 12 weeks   and  ongoing PRN for management phase CDT support   OT Treatment/Interventions Self-care/ADL training;DME and/or AE instruction;Manual lymph drainage;Therapeutic activities;Compression bandaging;Therapeutic exercise;Coping strategies training;Other (comment);Functional Mobility Training;Manual Therapy;Patient/family education   skin care with low ph castor oil during MLD   Plan Fit Pt w custom, knee length, adjustable CircAid compression leggings- calibrate to ccl 3. Provide MLD and compression bandaging until compression garments are fitted and Pt is able to don and doff with modified independence (extra time)    Recommended Other Services Pt will benefit from PT to improve balance, increase LB strength, increase endurance , and improve functional ambulation and mobility. Screening request submitted 12/11/20. Pt will benefit from advanced sequential pneumatic compression device, Tactile Medical Flexitouch, to limit lymphedema progression and to better manage severe , stage III lymphedema at home over time since she is unable to perform self MLD 2/2 comorbidities    Consulted and Agree with Plan of Care Patient             Patient will benefit from skilled therapeutic intervention in order to improve the following deficits and impairments:   Body Structure / Function / Physical Skills: ADL, Decreased knowledge of use of DME, Balance, Pain, Edema, ROM, IADL, Endurance, Scar mobility, Mobility, Decreased knowledge of precautions, Skin integrity, Strength       Visit Diagnosis: Lymphedema, not elsewhere classified    Problem List Patient Active Problem List   Diagnosis Date Noted   Sustained VT (ventricular tachycardia) 09/21/2019   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   Acute bronchitis 11/02/2016   Unstable angina ([place]) 00/17/4944   Chronic systolic heart failure ([place])    PVC's (premature ventricular contractions)    Ventricular fibrillation ([place]) 10/03/2015   Cardiac arrest ([place]) 10/03/2015   Essential hypertension 09/09/2015   Lymphedema 02/21/2015   Other specified hypothyroidism 02/21/2015    Andrey Spearman, MS, OTR/L, [facility] 05/21/21 9:47 AM    [place] [address] [place], [place], [address] Phone: [phone]   Fax:  [phone]  Name: Lisa [name] MRN: [id number] Date of Birth: 12/19/1940

## 2021-05-21 NOTE — Patient Instructions (Signed)

## 2021-05-22 DIAGNOSIS — I255 Ischemic cardiomyopathy: Secondary | ICD-10-CM

## 2021-05-22 DIAGNOSIS — I5022 Chronic systolic (congestive) heart failure: Secondary | ICD-10-CM

## 2021-05-23 ENCOUNTER — Ambulatory Visit: Payer: Medicare Other | Admitting: Occupational Therapy

## 2021-05-23 ENCOUNTER — Other Ambulatory Visit: Payer: Self-pay

## 2021-05-23 DIAGNOSIS — I890 Lymphedema, not elsewhere classified: Secondary | ICD-10-CM

## 2021-05-23 NOTE — Patient Instructions (Signed)

## 2021-05-23 NOTE — Therapy (Signed)
Lisa [name] [facility] SERVICES [address] [place], [place], [address] Phone: [phone]   Fax:  [phone]  Occupational Therapy Treatment  Patient Details  Name: Lisa [name] MRN: [id number] Date of Birth: 18-Jan-1941 No data recorded  Encounter Date: 05/23/2021   OT End of Session - 05/23/21 0919     Visit Number 15    Number of Visits 36    Date for OT Re-Evaluation 07/21/21    OT Start Time 0910    OT Stop Time 1005    OT Time Calculation (min) 55 min    Equipment Utilized During Treatment basic Nurse, adult advanced Flexitouch sequential pneumatic compression device    Activity Tolerance Patient tolerated treatment well;Other (comment)   Poor tolerance for [facility] device - caused pain in L foot, ankle and leg/knee   Behavior During Therapy [facility] for tasks assessed/performed             Past Medical History:  Diagnosis Date   CAD in native artery 2017   stent to LAD   Cardiac arrest ([place]) 09/2015   CHF (congestive heart failure) (HCC)    CML (chronic myelocytic leukemia) (HCC)    Complication of anesthesia    Hx: UTI (urinary tract infection)    Hypertension    Melanoma (HCC)    PONV (postoperative nausea and vomiting)    PVC (premature ventricular contraction)    HISTORY OF   Thyroid disease    V tach ([place])     Past Surgical History:  Procedure Laterality Date   ABDOMINAL HYSTERECTOMY     APPENDECTOMY     CARDIAC CATHETERIZATION N/A 10/03/2015   Procedure: Left Heart Cath and Coronary Angiography;  Surgeon: [name], MD;  Location: [place] [facility];  Service: Cardiovascular;  Laterality: N/A;   CARDIAC CATHETERIZATION N/A 10/07/2015   Procedure: Coronary Stent Intervention;  Surgeon: [name], MD;  Location: [place] [facility];  Service: Cardiovascular;  Laterality: N/A;   CARDIAC CATHETERIZATION  09/22/2019   EP IMPLANTABLE DEVICE N/A 01/20/2016    Procedure: BiV ICD Insertion CRT-D;  Surgeon: [name], MD;  Location: [place] [facility];  Service: Cardiovascular;  Laterality: N/A;   LEFT HEART CATH AND CORONARY ANGIOGRAPHY N/A 09/22/2019   Procedure: LEFT HEART CATH AND CORONARY ANGIOGRAPHY;  Surgeon: Martinique, Peter M, MD;  Location: [place] [facility] LAB;  Service: Cardiovascular;  Laterality: N/A;   TONSILLECTOMY     TONSILLECTOMY     TUBAL LIGATION      There were no vitals filed for this visit.   Subjective Assessment - 05/23/21 0920     Subjective  Lisa [name] presents for OT Rx visit 15//36 to address severe BLE lymphedema (I89.0). Pt  presents without compression wraps in place . She presents with mild lymphorrhea bilaterally below the knees again today.    Pertinent History CKD, Stage V9Y/I0, Chronic systolic congestive heart failure, severe, stage II-Mild stage III BLE lymphedema ( onset in 20's), hypothyroidism, HTN, Ventricular fibrillation, pacer regularly interrogated, hx skin breakdown on leg, chronic myologeneous leukemia, Hx R leg melanoma w/ wide excision, s/p cardiac arrest 2017    Limitations chronic leg swelling and pain, decreased balance, decreased A/PROM , decreased lower body strength (unable to lift legs), impaired endurance, intermittent lymphorrhea, hx recurrent BLE cellulitis and non-pressure related wounds    Repetition Increases Symptoms    Special Tests 12/11/20 FOTO 48/100. 05/02/21 FOTO: 48/100  Patient Stated Goals reduce leg swelling, improve functional mobility and safe ambulation, improve skin condition, have better systemic fluid balance,    Pain Onset More than a month ago                          OT Treatments/Exercises (OP) - 05/23/21 7902       ADLs   ADL Education Given Yes      Manual Therapy   Manual Therapy Edema management;Manual Lymphatic Drainage (MLD);Compression Bandaging    Edema Management Completed anatomical measutrements for BLE custom CircAid adjustable  leggings. Faxed to vendor after session    Manual Lymphatic Drainage (MLD) --    Compression Bandaging Applied multi layer, gradient compression wraps to LLE only, omitting foot. No wraps to RLE as Pt does not bring enough supplied to clinic to wrap biolateral legs. It's not a problem today, especialy in lieu of her SOB.                    OT Education - 05/23/21 1109     Education Details Continued Pt/ CG edu for lymphedema self care  and home program throughout session. Topics include multilayer, gradient compression wrapping, simple self-MLD, therapeutic lymphatic pumping exercises, skin/nail care, risk reduction factors and LE precautions, compression garments/recommendations and wear and care schedule and compression garment donning / doffing using assistive devices. All questions answered to the Pt's satisfaction, and Pt demonstrates understanding by report. Pt edu for Pandere expandable, adjustable shoes.    Person(s) Educated Patient    Methods Explanation;Demonstration;Handout    Comprehension Verbalized understanding;Returned demonstration;Need further instruction                 OT Long Term Goals - 05/02/21 1220       OT LONG TERM GOAL #1   Title Pt will be able to apply knee length, multi-layer, short stretch compression wraps to one leg using correct gradient techniques with Max caregiver assistance to decrease limb volume, to decrease leg pain and limit infection risk, and to improve safe functional ambulation and mobility.    Baseline dependent    Time 4    Period Days    Status Not Met   Pt does not have caregiver who can assist her daily with short stretch, gradient compression wraps between visits. Without daily compression prognosis for limb volume reduction is poor. 05/02/21: Wraps applied at clinical visits only.   Target Date 07/20/21      OT LONG TERM GOAL #2   Title Pt will demonstrate understanding of lymphedema precautions  by being able to name  and offer examples of 6 strategies using a printed reference for PRN.    Baseline Max A    Period Days    Status Not Met    Target Date 07/20/21      OT LONG TERM GOAL #3   Title Pt will achieve and sustain a least 85% compliance performing all daily LE self-care home program components throughout Intensive Phase CDT, including elevation when seated, recommended skin care regime, lymphatic pumping ther ex, 23/7 compression wraps and simple self-MLD, with Max caregiver assistance to ensure optimal limb volume reduction, to limit infection risk and to limit LE progression.    Baseline dependent    Time 4    Period Weeks    Status Not Met   05/02/21: Pt unable to apply wraps, to perform skininspection and skin care to distal legs and feet  due to limited ROM    and body habitus. Pt has not complied with recommendation for CG.   Target Date 07/20/21      OT LONG TERM GOAL #4   Title Pt will achieve at least 10%  limb volume reductions bilaterally during Intensive Phase CDT to prevent re-accumulation of lymphatic congestion and progression of fibrosis, to limit infection risk, to improve functional ambulation and transfers, and  to improve functional performance of basic and instrumental ADLs, and to limit LE progression.    Baseline dependent    Time 12    Period Weeks    Status Partially Met   9:23/22 : 10TH VISIT LLE dec 4.2%; RLE DEC 1.3%.   Target Date 07/20/21      OT LONG TERM GOAL #5   Title By DC from OT Pt will be able to don and doff appropriate daytime compression garments and HOS devices with Max CG assistance to limit lymphatic re-accumulation and LE progression with before transitioning to self-management phase of CDT.    Baseline dependent    Time 12    Period Weeks    Status Not Met    Target Date 07/20/21      OT LONG TERM GOAL #6   Title Pt will be able to don and doff alternative adjustable compression leggings with modified independence (extra time) to limit limb swelling  and infection risk. Pt does not have CG available to assist her with any other available compression modality.    Baseline dependent    Time 4    Period Weeks    Status On-going    Target Date 07/20/21                   Plan - 05/23/21 1106     Clinical Impression Statement Completed additional anatomical measurements for CircAids- custom  compression garment alternatives. Appliedcompression wraps to BLE as established. Cont as per POC.    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 Pt is unable to reach feet to apply essential multi-layer compression wraps .   Clinical Decision Making Several treatment options, min-mod task modification necessary    Comorbidities Affecting Occupational Performance: Presence of comorbidities impacting occupational performance    Comorbidities impacting occupational performance description: see Subjective    Modification or Assistance to Complete Evaluation  Min-Moderate modification of tasks or assist with assess necessary to complete eval    OT Frequency 2x / week    OT Duration 12 weeks   and ongoing PRN for management phase CDT support   OT Treatment/Interventions Self-care/ADL training;DME and/or AE instruction;Manual lymph drainage;Therapeutic activities;Compression bandaging;Therapeutic exercise;Coping strategies  training;Other (comment);Functional Mobility Training;Manual Therapy;Patient/family education   skin care with low ph castor oil during MLD   Plan Fit Pt w custom, knee length, adjustable CircAid compression leggings- calibrate to ccl 3. Provide MLD and compression bandaging until compression garments are fitted and Pt is able to don and doff with modified independence (extra time)    Recommended Other Services Pt will benefit from PT  to improve balance, increase LB strength, increase endurance , and improve functional ambulation and mobility. Screening request submitted 12/11/20. Pt will benefit from advanced sequential pneumatic compression device, Tactile Medical Flexitouch, to limit lymphedema progression and to better manage severe , stage III lymphedema at home over time since she is unable to perform self MLD 2/2 comorbidities    Consulted and Agree with Plan of Care Patient             Patient will benefit from skilled therapeutic intervention in order to improve the following deficits and impairments:   Body Structure / Function / Physical Skills: ADL, Decreased knowledge of use of DME, Balance, Pain, Edema, ROM, IADL, Endurance, Scar mobility, Mobility, Decreased knowledge of precautions, Skin integrity, Strength       Visit Diagnosis: Lymphedema, not elsewhere classified    Problem List Patient Active Problem List   Diagnosis Date Noted   Sustained VT (ventricular tachycardia) 09/21/2019   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   Acute bronchitis 11/02/2016   Unstable angina ([place]) 90/07/2240   Chronic systolic heart failure ([place])    PVC's (premature ventricular contractions)    Ventricular fibrillation ([place]) 10/03/2015   Cardiac arrest ([place]) 10/03/2015   Essential hypertension 09/09/2015   Lymphedema 02/21/2015   Other specified hypothyroidism 02/21/2015    Andrey Spearman, MS, OTR/L, CLT-[facility]NA 05/23/21 11:10 AM    [place] [address] [place], [place], [address] Phone: [phone]   Fax:  [phone]  Name: Lisa [name] MRN: [id number] Date of Birth: 1941-05-31

## 2021-05-25 NOTE — Telephone Encounter (Signed)
I would recomm getting the echo here so we can compare to what it looked like back in 2021

## 2021-05-26 ENCOUNTER — Ambulatory Visit (INDEPENDENT_AMBULATORY_CARE_PROVIDER_SITE_OTHER): Payer: Medicare Other

## 2021-05-26 ENCOUNTER — Ambulatory Visit: Payer: Medicare Other | Admitting: Occupational Therapy

## 2021-05-26 ENCOUNTER — Other Ambulatory Visit: Payer: Self-pay

## 2021-05-26 DIAGNOSIS — I890 Lymphedema, not elsewhere classified: Secondary | ICD-10-CM | POA: Diagnosis not present

## 2021-05-26 DIAGNOSIS — I5022 Chronic systolic (congestive) heart failure: Secondary | ICD-10-CM

## 2021-05-26 DIAGNOSIS — Z95810 Presence of automatic (implantable) cardiac defibrillator: Secondary | ICD-10-CM

## 2021-05-26 NOTE — Therapy (Signed)
[place] MAIN [facility] SERVICES [address] [place], [place], [address] Phone: [phone]   Fax:  [phone]  Occupational Therapy Treatment  Patient Details  Name: Lisa [name] MRN: [id number] Date of Birth: [birth year] No data recorded  Encounter Date: 05/26/2021   OT End of Session - 05/26/21 1301     Visit Number 16    Number of Visits 36    Date for OT Re-Evaluation 07/21/21    OT Start Time 1000    OT Stop Time 1100    OT Time Calculation (min) 60 min    Activity Tolerance Patient tolerated treatment well;Other (comment);Treatment limited secondary to medical complications (Comment)   Poor tolerance for [facility] device - caused pain in L foot, ankle and leg/knee   Behavior During Therapy [facility] for tasks assessed/performed             Past Medical History:  Diagnosis Date   CAD in native artery 2017   stent to LAD   Cardiac arrest ([place]) 09/2015   CHF (congestive heart failure) (HCC)    CML (chronic myelocytic leukemia) (HCC)    Complication of anesthesia    Hx: UTI (urinary tract infection)    Hypertension    Melanoma (HCC)    PONV (postoperative nausea and vomiting)    PVC (premature ventricular contraction)    HISTORY OF   Thyroid disease    V tach ([place])     Past Surgical History:  Procedure Laterality Date   ABDOMINAL HYSTERECTOMY     APPENDECTOMY     CARDIAC CATHETERIZATION N/A 10/03/2015   Procedure: Left Heart Cath and Coronary Angiography;  Surgeon: [name], MD;  Location: [place] [facility];  Service: Cardiovascular;  Laterality: N/A;   CARDIAC CATHETERIZATION N/A 10/07/2015   Procedure: Coronary Stent Intervention;  Surgeon: [name], MD;  Location: [place] [facility];  Service: Cardiovascular;  Laterality: N/A;   CARDIAC CATHETERIZATION  09/22/2019   EP IMPLANTABLE DEVICE N/A 01/20/2016   Procedure: BiV ICD Insertion CRT-D;  Surgeon: [name], MD;  Location: [place] [facility];   Service: Cardiovascular;  Laterality: N/A;   LEFT HEART CATH AND CORONARY ANGIOGRAPHY N/A 09/22/2019   Procedure: LEFT HEART CATH AND CORONARY ANGIOGRAPHY;  Surgeon: Martinique, Peter M, MD;  Location: [place] [facility];  Service: Cardiovascular;  Laterality: N/A;   TONSILLECTOMY     TONSILLECTOMY     TUBAL LIGATION      There were no vitals filed for this visit.   Subjective Assessment - 05/26/21 1326     Subjective  Lisa [name] presents for OT Rx visit 16//36 to address severe BLE lymphedema (I89.0). Pt  presents without compression wraps in place . Pt reoprts worsening LLE lynmphorrhea started yesterday afternoon after removing compression wraps applied at this clinic this past Friday. Pt has no caregiver assistance  with skin care and compression wraps between OT visits, so she leaves wraps in place for several days, against instructions.    Pertinent History CKD, Stage C7E/L3, Chronic systolic congestive heart failure, severe, stage II-Mild stage III BLE lymphedema ( onset in 20's), hypothyroidism, HTN, Ventricular fibrillation, pacer regularly interrogated, hx skin breakdown on leg, chronic myologeneous leukemia, Hx R leg melanoma w/ wide excision, s/p cardiac arrest 2017    Limitations chronic leg swelling and pain, decreased balance, decreased A/PROM , decreased lower body strength (unable to lift legs), impaired endurance, intermittent lymphorrhea, hx recurrent BLE cellulitis and non-pressure related wounds  Repetition Increases Symptoms    Special Tests 12/11/20 FOTO 48/100. 05/02/21 FOTO: 48/100    Patient Stated Goals reduce leg swelling, improve functional mobility and safe ambulation, improve skin condition, have better systemic fluid balance,    Pain Onset More than a month ago                          OT Treatments/Exercises (OP) - 05/26/21 1329       ADLs   ADL Education Given Yes      Manual Therapy   Manual Therapy Edema management;Compression Bandaging     Compression Bandaging Applied multi layer, gradient compression wraps to LLE only, omitting foot. No wraps to RLE as Pt does not bring enough supplied to clinic to wrap biolateral legs. It's not a problem today, especialy in lieu of her SOB.                    OT Education - 05/26/21 1333     Education Details Pt edu re effects of ongoing lymphorrhea-induced maceration on the skin. Pt instructed to return to wound care center   ASAP for evaluation ad treatment of non-pressure related leg wound to limit infection risk and worsening skin condition.    Person(s) Educated Patient    Methods Explanation;Demonstration;Handout    Comprehension Verbalized understanding;Returned demonstration;Need further instruction                 OT Long Term Goals - 05/02/21 1220       OT LONG TERM GOAL #1   Title Pt will be able to apply knee length, multi-layer, short stretch compression wraps to one leg using correct gradient techniques with Max caregiver assistance to decrease limb volume, to decrease leg pain and limit infection risk, and to improve safe functional ambulation and mobility.    Baseline dependent    Time 4    Period Days    Status Not Met   Pt does not have caregiver who can assist her daily with short stretch, gradient compression wraps between visits. Without daily compression prognosis for limb volume reduction is poor. 05/02/21: Wraps applied at clinical visits only.   Target Date 07/20/21      OT LONG TERM GOAL #2   Title Pt will demonstrate understanding of lymphedema precautions  by being able to name and offer examples of 6 strategies using a printed reference for PRN.    Baseline Max A    Period Days    Status Not Met    Target Date 07/20/21      OT LONG TERM GOAL #3   Title Pt will achieve and sustain a least 85% compliance performing all daily LE self-care home program components throughout Intensive Phase CDT, including elevation when seated, recommended  skin care regime, lymphatic pumping ther ex, 23/7 compression wraps and simple self-MLD, with Max caregiver assistance to ensure optimal limb volume reduction, to limit infection risk and to limit LE progression.    Baseline dependent    Time 4    Period Weeks    Status Not Met   05/02/21: Pt unable to apply wraps, to perform skininspection and skin care to distal legs and feet due to limited ROM    and body habitus. Pt has not complied with recommendation for CG.   Target Date 07/20/21      OT LONG TERM GOAL #4   Title Pt will achieve at least 10%  limb  volume reductions bilaterally during Intensive Phase CDT to prevent re-accumulation of lymphatic congestion and progression of fibrosis, to limit infection risk, to improve functional ambulation and transfers, and  to improve functional performance of basic and instrumental ADLs, and to limit LE progression.    Baseline dependent    Time 12    Period Weeks    Status Partially Met   9:23/22 : 10TH VISIT LLE dec 4.2%; RLE DEC 1.3%.   Target Date 07/20/21      OT LONG TERM GOAL #5   Title By DC from OT Pt will be able to don and doff appropriate daytime compression garments and HOS devices with Max CG assistance to limit lymphatic re-accumulation and LE progression with before transitioning to self-management phase of CDT.    Baseline dependent    Time 12    Period Weeks    Status Not Met    Target Date 07/20/21      OT LONG TERM GOAL #6   Title Pt will be able to don and doff alternative adjustable compression leggings with modified independence (extra time) to limit limb swelling and infection risk. Pt does not have CG available to assist her with any other available compression modality.    Baseline dependent    Time 4    Period Weeks    Status On-going    Target Date 07/20/21                   Plan - 05/26/21 1301     Clinical Impression Statement Unable to provide MLD today due to time needed to address worsening,  non-pressure related ulcer on the L anterior and anterior lateral distal leg . Lymphorrhea is mild, but continuous and skin is macerated and beginning to slough off.  This condition has been advancing over the last 2 visits . Pt has been treating at home with left over supplied from the wound care ctr, but theses are all gone. Pt also presents with slight lymphorrhea on posterior R mid leg. OT applied dry ABD pad with wide coban using light hand. Applied short stretch , multi layer compression wraps using gradient techniques as established from ankle to below the L knee. Pt instructed not to tear stockinett or any dressings from skin  if stuck , but to apply warm water to any stuck materials initially to loosen theem to avoid tearing skin. No dressing required on RLE. Pt instructed to return to wound center ASAP for care of LLE. In the meantime we are able to move forward with RLE. Custom CircAid Juxtafit Essentials have been ordered. Once fittings for these garments is complete and Flexitouch device is in place (if covered by insurance)   plan is to  DC OT as ongoing management of chronic lymphorrhea-induced leg ulcers is beyond the scope of this outpatient rehab clinic. Pt understands this plan. She understands that without assistance  with compression  daily her prognosis for optimal LE management is poor.    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 Pt is unable to reach feet to apply essential multi-layer compression wraps .   Clinical Decision Making Several treatment options, min-mod task modification necessary    Comorbidities Affecting Occupational Performance: Presence of comorbidities impacting occupational performance    Comorbidities impacting occupational performance description: see Subjective    Modification or Assistance to Complete Evaluation  Min-Moderate modification of tasks or assist with assess necessary to complete eval    OT Frequency 2x / week    OT Duration 12 weeks   and ongoing PRN for management phase CDT support   OT Treatment/Interventions Self-care/ADL training;DME and/or AE instruction;Manual lymph drainage;Therapeutic activities;Compression bandaging;Therapeutic exercise;Coping strategies training;Other (comment);Functional Mobility Training;Manual Therapy;Patient/family education   skin care with low ph castor oil during MLD   Plan Fit Pt w custom, knee length, adjustable CircAid compression leggings- calibrate to ccl 3. Provide MLD and compression bandaging until compression garments are fitted and Pt is able to don and doff with modified independence (extra time)    Recommended Other Services Pt will benefit from PT to improve balance, increase LB strength, increase endurance , and improve functional ambulation and mobility. Screening request submitted 12/11/20. Pt will benefit from advanced sequential pneumatic compression device, [employer] Flexitouch, to limit lymphedema progression and to better manage severe , stage III lymphedema at home over time since she is unable to perform self MLD 2/2 comorbidities    Consulted and Agree with Plan of Care Patient             Patient will benefit from skilled therapeutic intervention in order to improve the following deficits and impairments:   Body Structure / Function / Physical  Skills: ADL, Decreased knowledge of use of DME, Balance, Pain, Edema, ROM, IADL, Endurance, Scar mobility, Mobility, Decreased knowledge of precautions, Skin integrity, Strength       Visit Diagnosis: Lymphedema, not elsewhere classified    Problem List Patient Active Problem List   Diagnosis Date Noted   Sustained VT (ventricular tachycardia) 09/21/2019   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   Acute bronchitis 11/02/2016   Unstable angina ([place]) 01/75/1025   Chronic systolic heart failure ([place])    PVC's (premature ventricular contractions)    Ventricular fibrillation ([place]) 10/03/2015   Cardiac arrest ([employer]) 10/03/2015   Essential hypertension 09/09/2015   Lymphedema 02/21/2015   Other specified hypothyroidism 02/21/2015    Andrey Spearman, MS, OTR/L, CLT-LANA 05/26/21 1:35 PM   [place] MAIN [facility] SERVICES [address] [place], [place], [address] Phone: [phone]   Fax:  [phone]  Name: FINLEIGH CHEONG MRN: [id number] Date of Birth: 1940/09/03

## 2021-05-26 NOTE — Progress Notes (Signed)
EPIC Encounter for ICM Monitoring  Patient Name: Lisa [name] is a 80 y.o. female Date: 05/26/2021 Primary Care Physican: [name], MD Primary Cardiologist: Harrington Challenger Electrophysiologist: Santina Evans Pacing: 98.1%         03/25/2021 Office Weight: 260 lbs                                                            Transmission reviewed.    Optivol thoracic impedance trending close to baseline.  Fluid index > normal threshold starting 8/27.   Prescribed:  Torsemide Take 20 mg by mouth as directed. two days on one day off with potassium Potassium 10 mEq Take 10 mEq by mouth every other day. Per patient taking 2 tablets of 10 mEq to equal 20 mEq.   Labs: 04/04/2021 Creatinine 1.41, BUN 20, Potassium 4.1, Sodium 139, GFR 38 01/02/2021 Creatinine 1.44, BUN 20, Potassium 3.9, Sodium 142, GFR 37 11/16/2020 Creatinine 1.56, BUN 20, Potassium 4.6, Sodium 140, GFR 34 09/13/2020 Creatinine 1.32, BUN 21, Potassium 3.8, Sodium 141, GFR 41 A complete set of results can be found in Results Review.   Recommendations:  No changes.   Follow-up plan: ICM clinic phone appointment on 06/10/2021 to recheck fluid levels   91 day device clinic remote transmission 07/30/2021.     EP/Cardiology Office Visits: 06/06/2021 with Dr. Harrington Challenger.  Recall 03/21/2022 with Dr Lovena Le.   Copy of ICM check sent to Dr. Lovena Le.     3 month ICM trend: 05/26/2021.    1 Year ICM trend:       Lisa Billings, RN 05/26/2021 10:23 AM

## 2021-05-26 NOTE — Patient Instructions (Signed)
05/26/21 :  Pt instructed to return to wound care center   ASAP for evaluation ad treatment of non-pressure related leg wound to limit infection risk and worsening skin condition.    Lymphedema Self- Care Instructions   1. EXERCISE: Perform lymphatic pumping there ex at least 2 x a day while wearing your compression wraps or garments. Perform 10 reps of each exercise bilaterally and be sure to perform them in order. Don't skip around!  OMIT PARTIAL SIT UPs.  2. MLD: Perform simple self-manual lymphatic drainage (MLD) at least once a day as directed. Take your time! Breathe! ;-)  3. If you have a Flexitouch advanced "pump" use it 1 time each day on a single limb only. The Flexitouch moves lymphatic fluid out of your affected body part and back to your heart, so DO NOT use the Flexi on 2 legs at a time, and DO NOT ues it on 2 legs on the same day. If you experience any atypical shortness of breath, sudden onset of pain, or feelings of heart arhythmia, or racing, discontinue use of the Flexitouch and report these symptoms to your doctor right away. Also, discontinue Flexi if you have an infection or a fever. It's OK to resume using the device 72 hours AFTER your first dose of oral antibiotic.   4. 4. WRAPS: Compression wraps are to be worn 23 hrs/ 7 days/wk during Intensive Phase of Complete Decongestive Therapy (CDT).Building tolerance may take time and practice, so don't get discouraged. If bandages begin to feel tight during periods of inactivity and/or during the night, try performing your exercises to loosen them. Do not leave short stretch wraps in place for > 23 hours. It is very important that you remove all wraps daily to inspect skin, bathe and perform skin care before reapplying your wraps.  5. Daytime GARMENTS/ HOS DEVICES: During the Self-Management Phase CDT your compression garments are to be worn during waking hours when active. Do NOT sleep in your garments!!  Don daytime garments first  thing in the morning. Do not wear your HOS devices all day instead of garments. These will not contain your swelling.  6. PUT YOUR FEET UP! Elevate your feet and legs and feet to the level of your heart whenever you are sitting down.   7. SKIN: Carefully monitor skin condition and perform impeccable hygiene daily. Bathe skin with mild soap and water and apply low pH lotion (aka Eucerin ) to improve hydration and limit infection risk.

## 2021-05-30 ENCOUNTER — Ambulatory Visit: Payer: Medicare Other | Admitting: Occupational Therapy

## 2021-05-30 ENCOUNTER — Other Ambulatory Visit: Payer: Self-pay

## 2021-05-30 DIAGNOSIS — I890 Lymphedema, not elsewhere classified: Secondary | ICD-10-CM | POA: Diagnosis not present

## 2021-05-30 NOTE — Patient Instructions (Signed)

## 2021-05-30 NOTE — Therapy (Signed)
[place] MAIN [facility] SERVICES [address] [place], [place], [address] Phone: [phone]   Fax:  [phone]  Occupational Therapy Treatment  Patient Details  Name: Lisa [name] MRN: [id number] Date of Birth: 06-08-1941 No data recorded  Encounter Date: 05/30/2021   OT End of Session - 05/30/21 0919     Visit Number 17    Number of Visits 36    Date for OT Re-Evaluation 07/21/21    OT Start Time 0905    OT Stop Time 1010    OT Time Calculation (min) 65 min    Activity Tolerance Patient tolerated treatment well;Other (comment);Treatment limited secondary to medical complications (Comment)   Poor tolerance for [facility] device - caused pain in L foot, ankle and leg/knee   Behavior During Therapy [facility] for tasks assessed/performed             Past Medical History:  Diagnosis Date   CAD in native artery 2017   stent to LAD   Cardiac arrest ([place]) 09/2015   CHF (congestive heart failure) (HCC)    CML (chronic myelocytic leukemia) (HCC)    Complication of anesthesia    Hx: UTI (urinary tract infection)    Hypertension    Melanoma (HCC)    PONV (postoperative nausea and vomiting)    PVC (premature ventricular contraction)    HISTORY OF   Thyroid disease    V tach ([place])     Past Surgical History:  Procedure Laterality Date   ABDOMINAL HYSTERECTOMY     APPENDECTOMY     CARDIAC CATHETERIZATION N/A 10/03/2015   Procedure: Left Heart Cath and Coronary Angiography;  Surgeon: [name], MD;  Location: [place] [facility];  Service: Cardiovascular;  Laterality: N/A;   CARDIAC CATHETERIZATION N/A 10/07/2015   Procedure: Coronary Stent Intervention;  Surgeon: [name], MD;  Location: [place] [facility];  Service: Cardiovascular;  Laterality: N/A;   CARDIAC CATHETERIZATION  09/22/2019   EP IMPLANTABLE DEVICE N/A 01/20/2016   Procedure: BiV ICD Insertion CRT-D;  Surgeon: [name], MD;  Location: [place] [facility];   Service: Cardiovascular;  Laterality: N/A;   LEFT HEART CATH AND CORONARY ANGIOGRAPHY N/A 09/22/2019   Procedure: LEFT HEART CATH AND CORONARY ANGIOGRAPHY;  Surgeon: Martinique, Peter M, MD;  Location: [place] [facility];  Service: Cardiovascular;  Laterality: N/A;   TONSILLECTOMY     TONSILLECTOMY     TUBAL LIGATION      There were no vitals filed for this visit.   Subjective Assessment - 05/30/21 0920     Subjective  Lisa [name] presents for OT Rx visit 17//36 to address severe BLE lymphedema (I89.0). Pt  presents without compression wraps in place . Pt denies leg pain this morning. She reports LLE skin condition is improved since last visit. There is no record of a visit to wound care center in EMR as recommended .    Pertinent History CKD, Stage E3X/V4, Chronic systolic congestive heart failure, severe, stage II-Mild stage III BLE lymphedema ( onset in 20's), hypothyroidism, HTN, Ventricular fibrillation, pacer regularly interrogated, hx skin breakdown on leg, chronic myologeneous leukemia, Hx R leg melanoma w/ wide excision, s/p cardiac arrest 2017    Limitations chronic leg swelling and pain, decreased balance, decreased A/PROM , decreased lower body strength (unable to lift legs), impaired endurance, intermittent lymphorrhea, hx recurrent BLE cellulitis and non-pressure related wounds    Repetition Increases Symptoms    Special Tests 12/11/20 FOTO  48/100. 05/02/21 FOTO: 48/100    Patient Stated Goals reduce leg swelling, improve functional mobility and safe ambulation, improve skin condition, have better systemic fluid balance,    Pain Onset More than a month ago                          OT Treatments/Exercises (OP) - 05/30/21 1230       ADLs   ADL Education Given Yes      Manual Therapy   Manual Therapy Edema management;Compression Bandaging    Manual therapy comments applied ABD pad with single layer of coban to distal anterior L leg  where lymphorrhea-induced skin  irritation is noted.    Manual Lymphatic Drainage (MLD) MLD to LLE/LLW using typical, non-cancer related sequences, including J strokes to terminus only ( no neck strokes in keeping w thyroid precautions), functional inguinal LN and J strokes to thigh first, then medial knee. and lastly leg and foot, before returning 3 x from distal to proximal completing manual session with clabicals..    Compression Bandaging Applied multi layer, gradient compression wraps to LLE only, omitting foot. No wraps to RLE as Pt does not bring enough supplied to clinic to wrap biolateral legs. It's not a problem today, especialy in lieu of her SOB.                    OT Education - 05/30/21 1231     Education Details Pt edu re effects of ongoing lymphorrhea-induced maceration on the skin. Pt instructed to return to wound care center   ASAP for evaluation ad treatment of non-pressure related leg wound to limit infection risk and worsening skin condition.    Person(s) Educated Patient    Methods Explanation;Demonstration;Handout    Comprehension Verbalized understanding;Returned demonstration;Need further instruction                 OT Long Term Goals - 05/02/21 1220       OT LONG TERM GOAL #1   Title Pt will be able to apply knee length, multi-layer, short stretch compression wraps to one leg using correct gradient techniques with Max caregiver assistance to decrease limb volume, to decrease leg pain and limit infection risk, and to improve safe functional ambulation and mobility.    Baseline dependent    Time 4    Period Days    Status Not Met   Pt does not have caregiver who can assist her daily with short stretch, gradient compression wraps between visits. Without daily compression prognosis for limb volume reduction is poor. 05/02/21: Wraps applied at clinical visits only.   Target Date 07/20/21      OT LONG TERM GOAL #2   Title Pt will demonstrate understanding of lymphedema precautions  by  being able to name and offer examples of 6 strategies using a printed reference for PRN.    Baseline Max A    Period Days    Status Not Met    Target Date 07/20/21      OT LONG TERM GOAL #3   Title Pt will achieve and sustain a least 85% compliance performing all daily LE self-care home program components throughout Intensive Phase CDT, including elevation when seated, recommended skin care regime, lymphatic pumping ther ex, 23/7 compression wraps and simple self-MLD, with Max caregiver assistance to ensure optimal limb volume reduction, to limit infection risk and to limit LE progression.    Baseline dependent  Time 4    Period Weeks    Status Not Met   05/02/21: Pt unable to apply wraps, to perform skininspection and skin care to distal legs and feet due to limited ROM    and body habitus. Pt has not complied with recommendation for CG.   Target Date 07/20/21      OT LONG TERM GOAL #4   Title Pt will achieve at least 10%  limb volume reductions bilaterally during Intensive Phase CDT to prevent re-accumulation of lymphatic congestion and progression of fibrosis, to limit infection risk, to improve functional ambulation and transfers, and  to improve functional performance of basic and instrumental ADLs, and to limit LE progression.    Baseline dependent    Time 12    Period Weeks    Status Partially Met   9:23/22 : 10TH VISIT LLE dec 4.2%; RLE DEC 1.3%.   Target Date 07/20/21      OT LONG TERM GOAL #5   Title By DC from OT Pt will be able to don and doff appropriate daytime compression garments and HOS devices with Max CG assistance to limit lymphatic re-accumulation and LE progression with before transitioning to self-management phase of CDT.    Baseline dependent    Time 12    Period Weeks    Status Not Met    Target Date 07/20/21      OT LONG TERM GOAL #6   Title Pt will be able to don and doff alternative adjustable compression leggings with modified independence (extra time) to  limit limb swelling and infection risk. Pt does not have CG available to assist her with any other available compression modality.    Baseline dependent    Time 4    Period Weeks    Status On-going    Target Date 07/20/21                   Plan - 05/30/21 1226     Clinical Impression Statement LLE Skin condition is improved    this morning. Lymphorrhea appears mildly increased at distal leg. Provided gentle MLD proximal to affected area . Applied gradient compression wraps bilaterally from toes to tibial tuberosities. Pt able to don shoes independently after session. Cont as per POC. Awaiting delivery of custom CircAids (on order).    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 Pt is unable to reach feet to apply essential multi-layer compression wraps .   Clinical Decision Making Several treatment options, min-mod task modification necessary    Comorbidities Affecting Occupational Performance: Presence of comorbidities impacting occupational performance    Comorbidities impacting occupational performance description: see Subjective    Modification or Assistance to Complete Evaluation  Min-Moderate modification of tasks or assist with assess necessary to complete eval    OT Frequency 2x / week    OT Duration 12 weeks   and ongoing PRN for management phase CDT  support   OT Treatment/Interventions Self-care/ADL training;DME and/or AE instruction;Manual lymph drainage;Therapeutic activities;Compression bandaging;Therapeutic exercise;Coping strategies training;Other (comment);Functional Mobility  Training;Manual Therapy;Patient/family education   skin care with low ph castor oil during MLD   Plan Fit Pt w custom, knee length, adjustable CircAid compression leggings- calibrate to ccl 3. Provide MLD and compression bandaging until compression garments are fitted and Pt is able to don and doff with modified independence (extra time)    Recommended Other Services Pt will benefit from PT to improve balance, increase LB strength, increase endurance , and improve functional ambulation and mobility. Screening request submitted 12/11/20. Pt will benefit from advanced sequential pneumatic compression device, T[employer]ile [employer] Flexitouch, to limit lymphedema progression and to better manage severe , stage III lymphedema at home over time since she is unable to perform self MLD 2/2 comorbidities    Consulted and Agree with Plan of Care Patient             Patient will benefit from skilled therapeutic intervention in order to improve the following deficits and impairments:   Body Structure / Function / Physical Skills: ADL, Decreased knowledge of use of DME, Balance, Pain, Edema, ROM, IADL, Endurance, Scar mobility, Mobility, Decreased knowledge of precautions, Skin integrity, Strength       Visit Diagnosis: Lymphedema, not elsewhere classified    Problem List Patient Active Problem List   Diagnosis Date Noted   Sustained VT (ventricular tachycardia) 09/21/2019   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 [place], [place], [address] Phone: [phone]   Fax:  [phone]  Name: Lisa [name] MRN: [id number] Date of Birth: Dec 18, 1940

## 2021-06-02 ENCOUNTER — Encounter: Payer: Medicare Other | Admitting: Occupational Therapy

## 2021-06-03 ENCOUNTER — Other Ambulatory Visit: Payer: Self-pay

## 2021-06-03 ENCOUNTER — Ambulatory Visit: Payer: Medicare Other | Admitting: Occupational Therapy

## 2021-06-03 DIAGNOSIS — I890 Lymphedema, not elsewhere classified: Secondary | ICD-10-CM | POA: Diagnosis not present

## 2021-06-04 NOTE — Patient Instructions (Signed)

## 2021-06-04 NOTE — Therapy (Signed)
[place] MAIN [facility] SERVICES [address] [place], [place], [address] Phone: [phone]   Fax:  [phone]  Occupational Therapy Treatment  Patient Details  Name: Lisa [name] MRN: [id number] Date of Birth: October 13, 1940 No data recorded  Encounter Date: 06/03/2021   OT End of Session - 06/03/21 1312     Visit Number 18    Number of Visits 36    Date for OT Re-Evaluation 07/21/21    OT Start Time 0105    OT Stop Time 0215    OT Time Calculation (min) 70 min    Activity Tolerance Patient tolerated treatment well;Other (comment);Treatment limited secondary to medical complications (Comment)   Poor tolerance for [facility] device - caused pain in L foot, ankle and leg/knee   Behavior During Therapy [facility] for tasks assessed/performed             Past Medical History:  Diagnosis Date   CAD in native artery 2017   stent to LAD   Cardiac arrest ([place]) 09/2015   CHF (congestive heart failure) (HCC)    CML (chronic myelocytic leukemia) (HCC)    Complication of anesthesia    Hx: UTI (urinary tract infection)    Hypertension    Melanoma (HCC)    PONV (postoperative nausea and vomiting)    PVC (premature ventricular contraction)    HISTORY OF   Thyroid disease    V tach ([place])     Past Surgical History:  Procedure Laterality Date   ABDOMINAL HYSTERECTOMY     APPENDECTOMY     CARDIAC CATHETERIZATION N/A 10/03/2015   Procedure: Left Heart Cath and Coronary Angiography;  Surgeon: [name], MD;  Location: [place] CV LAB;  Service: Cardiovascular;  Laterality: N/A;   CARDIAC CATHETERIZATION N/A 10/07/2015   Procedure: Coronary Stent Intervention;  Surgeon: [name], MD;  Location: [place] CV LAB;  Service: Cardiovascular;  Laterality: N/A;   CARDIAC CATHETERIZATION  09/22/2019   EP IMPLANTABLE DEVICE N/A 01/20/2016   Procedure: BiV ICD Insertion CRT-D;  Surgeon: [name], MD;  Location: [place] CV LAB;   Service: Cardiovascular;  Laterality: N/A;   LEFT HEART CATH AND CORONARY ANGIOGRAPHY N/A 09/22/2019   Procedure: LEFT HEART CATH AND CORONARY ANGIOGRAPHY;  Surgeon: Martinique, Peter M, MD;  Location: [place] CV LAB;  Service: Cardiovascular;  Laterality: N/A;   TONSILLECTOMY     TONSILLECTOMY     TUBAL LIGATION      There were no vitals filed for this visit.   Subjective Assessment - 06/03/21 1314     Subjective  Lisa [name] presents for OT Rx visit 18/36 to address severe BLE lymphedema (I89.0). Pt  presents without compression wraps in place . Pt denies leg pain this morning. She reports LLE skin condition is improved since last visit. I am still leaking a lot on the L side. "Nothing I can see is red."    Pertinent History CKD, Stage B8G/Y6, Chronic systolic congestive heart failure, severe, stage II-Mild stage III BLE lymphedema ( onset in 20's), hypothyroidism, HTN, Ventricular fibrillation, pacer regularly interrogated, hx skin breakdown on leg, chronic myologeneous leukemia, Hx R leg melanoma w/ wide excision, s/p cardiac arrest 2017    Limitations chronic leg swelling and pain, decreased balance, decreased A/PROM , decreased lower body strength (unable to lift legs), impaired endurance, intermittent lymphorrhea, hx recurrent BLE cellulitis and non-pressure related wounds    Repetition Increases Symptoms    Special Tests 12/11/20 FOTO  48/100. 05/02/21 FOTO: 48/100    Patient Stated Goals reduce leg swelling, improve functional mobility and safe ambulation, improve skin condition, have better systemic fluid balance,    Currently in Pain? No/denies    Pain Onset More than a month ago                          OT Treatments/Exercises (OP) - 06/04/21 1112       ADLs   ADL Education Given Yes      Manual Therapy   Manual Therapy Edema management;Compression Bandaging    Manual therapy comments applied non stick pad with single layer of coban to distal anterior L leg   where lymphorrhea-induced skin irritation is noted.    Manual Lymphatic Drainage (MLD) MLD to LLE/LLW using typical, non-cancer related sequences, including J strokes to terminus only ( no neck strokes in keeping w thyroid precautions), functional inguinal LN and J strokes to thigh first, then medial knee. and lastly leg and foot, before returning 3 x from distal to proximal completing manual session with clabicals..    Compression Bandaging Applied multi layer, gradient compression wraps to LLE only, omitting foot. No wraps to RLE as Pt does not bring enough supplied to clinic to wrap biolateral legs. It's not a problem today, especialy in lieu of her SOB.                    OT Education - 06/04/21 1113     Education Details Pt edu re effects of ongoing lymphorrhea-induced maceration on the skin. Pt instructed to return to wound care center   ASAP for evaluation ad treatment of non-pressure related leg wound to limit infection risk and worsening skin condition.    Person(s) Educated Patient    Methods Explanation;Demonstration;Handout    Comprehension Verbalized understanding;Returned demonstration;Need further instruction                 OT Long Term Goals - 05/02/21 1220       OT LONG TERM GOAL #1   Title Pt will be able to apply knee length, multi-layer, short stretch compression wraps to one leg using correct gradient techniques with Max caregiver assistance to decrease limb volume, to decrease leg pain and limit infection risk, and to improve safe functional ambulation and mobility.    Baseline dependent    Time 4    Period Days    Status Not Met   Pt does not have caregiver who can assist her daily with short stretch, gradient compression wraps between visits. Without daily compression prognosis for limb volume reduction is poor. 05/02/21: Wraps applied at clinical visits only.   Target Date 07/20/21      OT LONG TERM GOAL #2   Title Pt will demonstrate understanding  of lymphedema precautions  by being able to name and offer examples of 6 strategies using a printed reference for PRN.    Baseline Max A    Period Days    Status Not Met    Target Date 07/20/21      OT LONG TERM GOAL #3   Title Pt will achieve and sustain a least 85% compliance performing all daily LE self-care home program components throughout Intensive Phase CDT, including elevation when seated, recommended skin care regime, lymphatic pumping ther ex, 23/7 compression wraps and simple self-MLD, with Max caregiver assistance to ensure optimal limb volume reduction, to limit infection risk and to limit LE progression.  Baseline dependent    Time 4    Period Weeks    Status Not Met   05/02/21: Pt unable to apply wraps, to perform skininspection and skin care to distal legs and feet due to limited ROM    and body habitus. Pt has not complied with recommendation for CG.   Target Date 07/20/21      OT LONG TERM GOAL #4   Title Pt will achieve at least 10%  limb volume reductions bilaterally during Intensive Phase CDT to prevent re-accumulation of lymphatic congestion and progression of fibrosis, to limit infection risk, to improve functional ambulation and transfers, and  to improve functional performance of basic and instrumental ADLs, and to limit LE progression.    Baseline dependent    Time 12    Period Weeks    Status Partially Met   9:23/22 : 10TH VISIT LLE dec 4.2%; RLE DEC 1.3%.   Target Date 07/20/21      OT LONG TERM GOAL #5   Title By DC from OT Pt will be able to don and doff appropriate daytime compression garments and HOS devices with Max CG assistance to limit lymphatic re-accumulation and LE progression with before transitioning to self-management phase of CDT.    Baseline dependent    Time 12    Period Weeks    Status Not Met    Target Date 07/20/21      OT LONG TERM GOAL #6   Title Pt will be able to don and doff alternative adjustable compression leggings with modified  independence (extra time) to limit limb swelling and infection risk. Pt does not have CG available to assist her with any other available compression modality.    Baseline dependent    Time 4    Period Weeks    Status On-going    Target Date 07/20/21                   Plan - 06/04/21 1107     Clinical Impression Statement BLE presenting with mild bilateral lymphorrhea, skin redness and maceration at distal R and L legs again today despite ongoing compression wraping at clinical visits. Pt encouraged to keep these areas very clean.. Provided LLE MLD and reapplied BLE  multilayer compression wraps as established using non-stick pad at area of skin redness on LLE.Pt has not followed advice to return to wound clinic for assessment and Rx.  Cont to carefully monitor skin condition for signs/ symptoms of infection and DC OT if condition worsens.    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 Pt is unable to reach feet to apply essential multi-layer compression wraps .   Clinical Decision Making Several treatment options, min-mod task modification necessary    Comorbidities Affecting Occupational Performance: Presence of comorbidities impacting occupational performance    Comorbidities impacting occupational performance description: see Subjective    Modification or  Assistance to Complete Evaluation  Min-Moderate modification of tasks or assist with assess necessary to complete eval    OT Frequency 2x /  week    OT Duration 12 weeks   and ongoing PRN for management phase CDT support   OT Treatment/Interventions Self-care/ADL training;DME and/or AE instruction;Manual lymph drainage;Therapeutic activities;Compression bandaging;Therapeutic exercise;Coping strategies training;Other (comment);Functional Mobility Training;Manual Therapy;Patient/family education   skin care with low ph castor oil during MLD   Plan Fit Pt w custom, knee length, adjustable CircAid compression leggings- calibrate to ccl 3. Provide MLD and compression bandaging until compression garments are fitted and Pt is able to don and doff with modified independence (extra time)    Recommended Other Services Pt will benefit from PT to improve balance, increase LB strength, increase endurance , and improve functional ambulation and mobility. Screening request submitted 12/11/20. Pt will benefit from advanced sequential pneumatic compression device, Tactile Medical Flexitouch, to limit lymphedema progression and to better manage severe , stage III lymphedema at home over time since she is unable to perform self MLD 2/2 comorbidities    Consulted and Agree with Plan of Care Patient             Patient will benefit from skilled therapeutic intervention in order to improve the following deficits and impairments:   Body Structure / Function / Physical Skills: ADL, Decreased knowledge of use of DME, Balance, Pain, Edema, ROM, IADL, Endurance, Scar mobility, Mobility, Decreased knowledge of precautions, Skin integrity, Strength       Visit Diagnosis: Lymphedema, not elsewhere classified    Problem List Patient Active Problem List   Diagnosis Date Noted   Sustained VT (ventricular tachycardia) 09/21/2019   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 [place], [place], [address] Phone: [phone]   Fax:  [phone]  Name: Lisa [name] MRN: [id number] Date of Birth: [birth year]

## 2021-06-06 ENCOUNTER — Encounter: Payer: Self-pay | Admitting: Internal Medicine

## 2021-06-06 ENCOUNTER — Ambulatory Visit: Payer: Medicare Other | Admitting: Internal Medicine

## 2021-06-06 ENCOUNTER — Ambulatory Visit (INDEPENDENT_AMBULATORY_CARE_PROVIDER_SITE_OTHER): Payer: Medicare Other

## 2021-06-06 ENCOUNTER — Other Ambulatory Visit: Payer: Self-pay

## 2021-06-06 ENCOUNTER — Ambulatory Visit (INDEPENDENT_AMBULATORY_CARE_PROVIDER_SITE_OTHER): Payer: Medicare Other | Admitting: Internal Medicine

## 2021-06-06 ENCOUNTER — Ambulatory Visit: Payer: Medicare Other | Admitting: Occupational Therapy

## 2021-06-06 VITALS — BP 110/46 | HR 71 | Ht 61.0 in | Wt 252.2 lb

## 2021-06-06 DIAGNOSIS — I2510 Atherosclerotic heart disease of native coronary artery without angina pectoris: Secondary | ICD-10-CM | POA: Diagnosis not present

## 2021-06-06 DIAGNOSIS — I890 Lymphedema, not elsewhere classified: Secondary | ICD-10-CM

## 2021-06-06 DIAGNOSIS — I5022 Chronic systolic (congestive) heart failure: Secondary | ICD-10-CM

## 2021-06-06 DIAGNOSIS — I255 Ischemic cardiomyopathy: Secondary | ICD-10-CM | POA: Diagnosis not present

## 2021-06-06 LAB — CUP PACEART INCLINIC DEVICE CHECK
Battery Remaining Longevity: 28 mo
Battery Voltage: 2.94 V
Brady Statistic AP VP Percent: 0.27 %
Brady Statistic AP VS Percent: 0.02 %
Brady Statistic AS VP Percent: 98.07 %
Brady Statistic AS VS Percent: 1.64 %
Brady Statistic RA Percent Paced: 0.29 %
Brady Statistic RV Percent Paced: 0.1 %
Date Time Interrogation Session: 20221028142854
HighPow Impedance: 52 Ohm
Implantable Lead Implant Date: 20170612
Implantable Lead Implant Date: 20170612
Implantable Lead Implant Date: 20170612
Implantable Lead Location: 753858
Implantable Lead Location: 753859
Implantable Lead Location: 753860
Implantable Lead Model: 4398
Implantable Lead Model: 5076
Implantable Pulse Generator Implant Date: 20170612
Lead Channel Impedance Value: 147.097
Lead Channel Impedance Value: 183.214
Lead Channel Impedance Value: 190.884
Lead Channel Impedance Value: 192.065
Lead Channel Impedance Value: 200.511
Lead Channel Impedance Value: 228 Ohm
Lead Channel Impedance Value: 285 Ohm
Lead Channel Impedance Value: 285 Ohm
Lead Channel Impedance Value: 304 Ohm
Lead Channel Impedance Value: 456 Ohm
Lead Channel Impedance Value: 513 Ohm
Lead Channel Impedance Value: 532 Ohm
Lead Channel Impedance Value: 589 Ohm
Lead Channel Impedance Value: 665 Ohm
Lead Channel Impedance Value: 703 Ohm
Lead Channel Impedance Value: 722 Ohm
Lead Channel Impedance Value: 722 Ohm
Lead Channel Impedance Value: 836 Ohm
Lead Channel Pacing Threshold Amplitude: 1 V
Lead Channel Pacing Threshold Amplitude: 1 V
Lead Channel Pacing Threshold Amplitude: 1.5 V
Lead Channel Pacing Threshold Pulse Width: 0.4 ms
Lead Channel Pacing Threshold Pulse Width: 0.4 ms
Lead Channel Pacing Threshold Pulse Width: 0.8 ms
Lead Channel Sensing Intrinsic Amplitude: 3.125 mV
Lead Channel Sensing Intrinsic Amplitude: 4 mV
Lead Channel Setting Pacing Amplitude: 2 V
Lead Channel Setting Pacing Amplitude: 2.25 V
Lead Channel Setting Pacing Amplitude: 2.5 V
Lead Channel Setting Pacing Pulse Width: 0.4 ms
Lead Channel Setting Pacing Pulse Width: 0.8 ms
Lead Channel Setting Sensing Sensitivity: 0.3 mV

## 2021-06-06 NOTE — Patient Instructions (Signed)

## 2021-06-06 NOTE — Progress Notes (Signed)
Cardiology Office Note   Date:  06/06/2021   ID:  Lisa [name], DOB 01-01-1941, MRN [id number]  PCP:  [name], MD  Cardiologist:   [name], MD   F/U of CHF      History of Present Illness:F Lisa [name] is a 80 y.o. 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  Cardiac catheterization was done.  This showed a 65% ramus narrowing.  LVEDP was moderately elevated at 23.[name]  LVEF was 50 to 55%.  She was seen by EP.  Placed on amiodarone.  I saw the pt in clinic in May 2022.   SHe was seen by Lisa [name] in Aug 2022 The pt says that she had a remote check last week that showed her fluid was up   Had some nauseated, vomited  Now better    Has appt on 11/1  Breathing is OK   Activity remains lmited  Legs swollen but she says they seem a little better    Just wrapped today     Current Meds  Medication Sig   acetaminophen (TYLENOL) 500 MG tablet Take 500-1,000 mg by mouth every 6 (six) hours as needed for moderate pain.   allopurinol (ZYLOPRIM) 100 MG tablet Take 100 mg by mouth daily.    amiodarone (PACERONE) 200 MG tablet Take 200 mg by mouth daily.   aspirin EC 81 MG tablet Take 81 mg by mouth at bedtime.    atorvastatin (LIPITOR) 20 MG tablet Take 1 tablet (20 mg total) by mouth daily.   calcium carbonate (OS-CAL - DOSED IN MG OF ELEMENTAL CALCIUM) 1250 (500 Ca) MG tablet Take 1 tablet by mouth.   carvedilol (COREG) 25 MG tablet TAKE ONE TABLET BY MOUTH TWICE DAILY   Cholecalciferol (VITAMIN D-3 PO) Take 5,000 Units by mouth daily.   imatinib (GLEEVEC)  400 MG tablet Take 400 mg by mouth daily with supper.    Inulin (METAMUCIL CLEAR & NATURAL PO) Take by mouth See admin instructions. Mix 1 teaspoonful into 8 ounces of water and drink as needed   Magnesium Oxide 400 MG CAPS Take 1 capsule (400 mg total) by mouth 2 (two) times daily.   Multiple Vitamin (MULTIVITAMIN WITH MINERALS) TABS tablet Take 1 tablet by mouth daily.   nitroGLYCERIN (NITROSTAT) 0.4 MG SL tablet Place 1 tablet (0.4 mg total) under the tongue every 5 (five) minutes x 3 doses as needed for chest pain.   potassium chloride (KLOR-CON) 10 MEQ tablet Take 10 mEq by mouth every other day. Per patient taking 2 tablets of 10 mEq to equal 20 mEq.   silver sulfADIAZINE (SILVADENE) 1 % cream Apply topically.   thyroid (ARMOUR) 15 MG tablet Take 15 mg by mouth daily before breakfast.    torsemide (DEMADEX) 20 MG tablet Take 1 tablet (20 mg total) by mouth as directed. (Patient taking differently: Take 20 mg by mouth as directed. two days on one day off with potassium)   triamcinolone ointment (KENALOG) 0.1 % Apply a small amount to the  affected area bid     Allergies:   Flagyl [metronidazole], Penicillins, Sulfamethoxazole, Meperidine, Morphine and related, Tape, Doxycycline, Lanolin, and Sulfa antibiotics   Past Medical History:  Diagnosis Date   CAD in native artery 2017   stent to LAD   Cardiac arrest ([place]) 09/2015   CHF (congestive heart failure) (HCC)    CML (chronic myelocytic leukemia) (HCC)    Complication of anesthesia    Hx: UTI (urinary tract infection)    Hypertension    Melanoma ([place])    PONV (postoperative nausea and vomiting)    PVC (premature ventricular contraction)    HISTORY OF   Thyroid disease    V tach     Past Surgical History:  Procedure Laterality Date   ABDOMINAL HYSTERECTOMY     APPENDECTOMY     CARDIAC CATHETERIZATION N/A 10/03/2015   Procedure: Left Heart Cath and Coronary Angiography;  Surgeon: [name], MD;  Location: [place] CV LAB;   Service: Cardiovascular;  Laterality: N/A;   CARDIAC CATHETERIZATION N/A 10/07/2015   Procedure: Coronary Stent Intervention;  Surgeon: [name], MD;  Location: [place] CV LAB;  Service: Cardiovascular;  Laterality: N/A;   CARDIAC CATHETERIZATION  09/22/2019   EP IMPLANTABLE DEVICE N/A 01/20/2016   Procedure: BiV ICD Insertion CRT-D;  Surgeon: [name], MD;  Location: [place] CV LAB;  Service: Cardiovascular;  Laterality: N/A;   LEFT HEART CATH AND CORONARY ANGIOGRAPHY N/A 09/22/2019   Procedure: LEFT HEART CATH AND CORONARY ANGIOGRAPHY;  Surgeon: Martinique, Peter M, MD;  Location: [place] CV LAB;  Service: Cardiovascular;  Laterality: N/A;   TONSILLECTOMY     TONSILLECTOMY     TUBAL LIGATION       Social History:  The patient  reports that she has never smoked. She has never used smokeless tobacco. She reports that she does not drink alcohol and does not use drugs.   Family History:  The patient's family history includes Coronary artery disease in her mother; Heart attack in her maternal grandfather and paternal grandfather; Multiple myeloma in her father; Thyroid disease in her mother.    ROS:  Please see the history of present illness. All other systems are reviewed and  Negative to the above problem except as noted.    PHYSICAL EXAM: VS:  BP (!) 110/46   Pulse 71   Ht _0  (1.549 m)   Wt 252 lb 3.2 oz (114.4 kg)   LMP 02/14/1995   SpO2 97%   BMI 47.65 kg/m   GEN: Morbidly obese 80 yo in no acute distress   Examined in chair HEENT: normal  Neck: JVP does not appear elevated  \Cardiac: RRR; no murmurs,   Lungs Cleart to auscultation Abdomen   Obese   Supple Leg:   Chronic lymphedema of legs  Legs are Wrapped  Skin: warm and dry  EKG:  EKG is not done today     Echo   9.22.21  1. Left ventricular ejection fraction, by estimation, is 45 to 50%. The left ventricle has mildly decreased function. The left ventricle demonstrates regional wall  motion abnormalities (see scoring diagram/findings for description). Left ventricular diastolic parameters are consistent with Grade II diastolic dysfunction (pseudonormalization). There is mild hypokinesis of the left ventricular, mid-apical inferolateral wall. 2. Right ventricular systolic function is normal. The right ventricular size is normal. There is normal pulmonary artery systolic pressure. 3. Left atrial size was moderately dilated. 4. The mitral valve is normal in structure.  Trivial mitral valve regurgitation. No evidence of mitral stenosis. 5. The aortic valve is tricuspid. There is mild calcification of the aortic valve. There is mild thickening of the aortic valve. Aortic valve regurgitation is not visualized. Mild aortic valve sclerosis is present, with no evidence of aortic valve stenosis. 6. The inferior vena cava is normal in size with <50% respiratory variability, suggesting right atrial pressure of 8 mmHg. Conclusion(s)/Recommendation(s): Mild WMA on short axis views of imd to distal inferior/inferolateral wall, not as well seen on apical views. Mildly reduced LVEF. Lipid Panel    Component Value Date/Time   CHOL 92 (L) 10/06/2019 1059   TRIG 92 10/06/2019 1059   HDL 39 (L) 10/06/2019 1059   CHOLHDL 2.4 10/06/2019 1059   CHOLHDL 4.2 10/04/2015 0329   VLDL 18 10/04/2015 0329   LDLCALC 35 10/06/2019 1059      Wt Readings from Last 3 Encounters:  06/06/21 252 lb 3.2 oz (114.4 kg)  03/25/21 260 lb 3.2 oz (118 kg)  12/09/20 255 lb 3.2 oz (115.8 kg)      ASSESSMENT AND PLAN:  1  Hx chronic systoiic CHF   Pt now s/p CRT   Echo as noted above  Mild LV dysfunction    Recent Optivol showed volume up   Today repeat is good    Keep on same meds       2   CAD PT had cath in February 2021 after a VT event.  No obstructive CAD noted.Patent stent    I am not convinced of active angina   Follow    3  Hx VF arrest   S/p Medtronic CRT-D.  Continue amiodarone.  Follow with  EP  4 Transient PAF short-lived she is not on anticoagulation. Continue to check device to follow for recurrence  5  HTN  BP controlled   Follow  6   Lipids  She is set to get labs soon in oncology   Given slip to get lipids checked  7.  CML.  Continues to follow with oncology    F/U next spring   [name]  Current medicines are reviewed at length with the patient today.  The patient does not have concerns regarding medicines.  Signed, [name], MD  06/06/2021 2:00 PM    [address], [place], [place]  [address] Phone: [phone]; Fax: [phone]

## 2021-06-06 NOTE — Progress Notes (Signed)
CRT-D device check in office per request of Dr. Harrington Challenger.  Thresholds and sensing consistent with previous device measurements. Lead impedance trends stable over time. No mode switch episodes recorded. No ventricular arrhythmia episodes recorded. Patient bi-ventricularly pacing 98.2% of the time. Device programmed with appropriate safety margins. Heart failure diagnostics reviewed- previously elevated levels now at baseline.  Audible/vibratory alerts demonstrated for patient. No changes made this session. Estimated longevity 2.3years.  Patient enrolled in remote follow up, next scheduled check 06/10/21.

## 2021-06-06 NOTE — Patient Instructions (Signed)
Medication Instructions:  Your physician recommends that you continue on your current medications as directed. Please refer to the Current Medication list given to you today.  *If you need a refill on your cardiac medications before your next appointment, please call your pharmacy*   Lab Work: NONE If you have labs (blood work) drawn today and your tests are completely normal, you will receive your results only by: [place] (if you have MyChart) OR A paper copy in the mail If you have any lab test that is abnormal or we need to change your treatment, we will call you to review the results.   Testing/Procedures: NONE   Follow-Up: At [facility], you and your health needs are our priority.  As part of our continuing mission to provide you with exceptional heart care, we have created designated Provider Care Teams.  These Care Teams include your primary Cardiologist (physician) and Advanced Practice Providers (APPs -  Physician Assistants and Nurse Practitioners) who all work together to provide you with the care you need, when you need it.  We recommend signing up for the patient portal called "MyChart".  Sign up information is provided on this After Visit Summary.  MyChart is used to connect with patients for Virtual Visits (Telemedicine).  Patients are able to view lab/test results, encounter notes, upcoming appointments, etc.  Non-urgent messages can be sent to your provider as well.   To learn more about what you can do with MyChart, go to [email or web address].    Your next appointment:   MARCH 2023  The format for your next appointment:   In Person  Provider:   You may see [name], MD or one of the following Advanced Practice Providers on your designated Care Team:   [name], PA-C [place], [place]

## 2021-06-06 NOTE — Therapy (Signed)
[place] MAIN [facility] SERVICES [address] [place], [place], [address] Phone: [phone]   Fax:  [phone]  Occupational Therapy Treatment  Patient Details  Name: Lisa [name] MRN: [id number] Date of Birth: 1941-08-09 No data recorded  Encounter Date: 06/06/2021   OT End of Session - 06/06/21 0858     Visit Number 19    Number of Visits 36    Date for OT Re-Evaluation 07/21/21    OT Start Time 0900    OT Stop Time 1020    OT Time Calculation (min) 80 min    Activity Tolerance Patient tolerated treatment well;Other (comment);Treatment limited secondary to medical complications (Comment)   Poor tolerance for [facility] device - caused pain in L foot, ankle and leg/knee   Behavior During Therapy [facility] for tasks assessed/performed             Past Medical History:  Diagnosis Date   CAD in native artery 2017   stent to LAD   Cardiac arrest ([place]) 09/2015   CHF (congestive heart failure) (HCC)    CML (chronic myelocytic leukemia) (HCC)    Complication of anesthesia    Hx: UTI (urinary tract infection)    Hypertension    Melanoma (HCC)    PONV (postoperative nausea and vomiting)    PVC (premature ventricular contraction)    HISTORY OF   Thyroid disease    V tach ([place])     Past Surgical History:  Procedure Laterality Date   ABDOMINAL HYSTERECTOMY     APPENDECTOMY     CARDIAC CATHETERIZATION N/A 10/03/2015   Procedure: Left Heart Cath and Coronary Angiography;  Surgeon: [name], MD;  Location: [place] CV LAB;  Service: Cardiovascular;  Laterality: N/A;   CARDIAC CATHETERIZATION N/A 10/07/2015   Procedure: Coronary Stent Intervention;  Surgeon: [name], MD;  Location: [place] CV [facility];  Service: Cardiovascular;  Laterality: N/A;   CARDIAC CATHETERIZATION  09/22/2019   EP IMPLANTABLE DEVICE N/A 01/20/2016   Procedure: BiV ICD Insertion CRT-D;  Surgeon: [name], MD;  Location: [place] CV [facility];   Service: Cardiovascular;  Laterality: N/A;   LEFT HEART CATH AND CORONARY ANGIOGRAPHY N/A 09/22/2019   Procedure: LEFT HEART CATH AND CORONARY ANGIOGRAPHY;  Surgeon: Martinique, Peter M, MD;  Location: [place] CV LAB;  Service: Cardiovascular;  Laterality: N/A;   TONSILLECTOMY     TONSILLECTOMY     TUBAL LIGATION      There were no vitals filed for this visit.                 OT Treatments/Exercises (OP) - 06/06/21 1039       ADLs   ADL Education Given Yes      Manual Therapy   Manual Therapy Edema management;Compression Bandaging    Manual therapy comments applied non stick pad with single layer of coban to distal anterior L leg  where lymphorrhea-induced skin irritation is noted.    Edema Management anatomical measurements for landmarks 45 and 50 cm proximal to [facility] - lateral aspect    Manual Lymphatic Drainage (MLD) MLD to LLE/LLW using typical, non-cancer related sequences, including J strokes to terminus only ( no neck strokes in keeping w thyroid precautions), functional inguinal LN and J strokes to thigh first, then medial knee. and lastly leg and foot, before returning 3 x from distal to proximal completing manual session with clabicals..    Compression Bandaging Applied multi layer, gradient compression  wraps to LLE only, omitting foot. No wraps to RLE as Pt does not bring enough supplied to clinic to wrap biolateral legs. It's not a problem today, especialy in lieu of her SOB.                    OT Education - 06/06/21 1040     Education Details Pt edu re effects of ongoing lymphorrhea-induced maceration on the skin. Pt instructed to return to wound care center   ASAP for evaluation ad treatment of non-pressure related leg wound to limit infection risk and worsening skin condition.    Person(s) Educated Patient    Methods Explanation;Demonstration;Handout    Comprehension Verbalized understanding;Returned demonstration;Need further instruction                  OT Long Term Goals - 05/02/21 1220       OT LONG TERM GOAL #1   Title Pt will be able to apply knee length, multi-layer, short stretch compression wraps to one leg using correct gradient techniques with Max caregiver assistance to decrease limb volume, to decrease leg pain and limit infection risk, and to improve safe functional ambulation and mobility.    Baseline dependent    Time 4    Period Days    Status Not Met   Pt does not have caregiver who can assist her daily with short stretch, gradient compression wraps between visits. Without daily compression prognosis for limb volume reduction is poor. 05/02/21: Wraps applied at clinical visits only.   Target Date 07/20/21      OT LONG TERM GOAL #2   Title Pt will demonstrate understanding of lymphedema precautions  by being able to name and offer examples of 6 strategies using a printed reference for PRN.    Baseline Max A    Period Days    Status Not Met    Target Date 07/20/21      OT LONG TERM GOAL #3   Title Pt will achieve and sustain a least 85% compliance performing all daily LE self-care home program components throughout Intensive Phase CDT, including elevation when seated, recommended skin care regime, lymphatic pumping ther ex, 23/7 compression wraps and simple self-MLD, with Max caregiver assistance to ensure optimal limb volume reduction, to limit infection risk and to limit LE progression.    Baseline dependent    Time 4    Period Weeks    Status Not Met   05/02/21: Pt unable to apply wraps, to perform skininspection and skin care to distal legs and feet due to limited ROM    and body habitus. Pt has not complied with recommendation for CG.   Target Date 07/20/21      OT LONG TERM GOAL #4   Title Pt will achieve at least 10%  limb volume reductions bilaterally during Intensive Phase CDT to prevent re-accumulation of lymphatic congestion and progression of fibrosis, to limit infection risk, to improve  functional ambulation and transfers, and  to improve functional performance of basic and instrumental ADLs, and to limit LE progression.    Baseline dependent    Time 12    Period Weeks    Status Partially Met   9:23/22 : 10TH VISIT LLE dec 4.2%; RLE DEC 1.3%.   Target Date 07/20/21      OT LONG TERM GOAL #5   Title By DC from OT Pt will be able to don and doff appropriate daytime compression garments and HOS devices with Max  CG assistance to limit lymphatic re-accumulation and LE progression with before transitioning to self-management phase of CDT.    Baseline dependent    Time 12    Period Weeks    Status Not Met    Target Date 07/20/21      OT LONG TERM GOAL #6   Title Pt will be able to don and doff alternative adjustable compression leggings with modified independence (extra time) to limit limb swelling and infection risk. Pt does not have CG available to assist her with any other available compression modality.    Baseline dependent    Time 4    Period Weeks    Status On-going    Target Date 07/20/21                   Plan - 06/06/21 1037     Clinical Impression Statement BLE presenting with mild bilateral lymphorrhea, skin redness and maceration at distal R and L legs again today despite ongoing compression wraping at clinical visits. Pt encouraged to keep these areas very clean.. Provided LLE MLD and reapplied BLE  multilayer compression wraps as established using non-stick pad at area of skin redness on BLE. Completed additional anatomical measurements for landmarks at 45 and 50 cm from [facility] bilateral by DME manufacturer's request. Cont to carefully monitor skin condition for signs/ symptoms of infection and DC OT if condition worsens.    OT Occupational Profile and History Comprehensive Assessment- Review of records and extensive additional review of physical, cognitive, psychosocial history related to current functional performance    Occupational performance  deficits (Please refer to evaluation for details): ADL's;IADL's;Education;Leisure;Social Participation;Other;Work   Teacher, adult education / Function / Physical Skills ADL;Decreased knowledge of use of DME;Balance;Pain;Edema;ROM;IADL;Endurance;Scar mobility;Mobility;Decreased knowledge of precautions;Skin integrity;Strength    Rehab Potential Fair   Decreased from Good to Fair-Poor with no caregiver to assist with CDT home program between visits. Pt is unable to reach feet to apply essential multi-layer compression wraps .   Clinical Decision Making Several treatment options, min-mod task modification necessary    Comorbidities Affecting Occupational Performance: Presence of comorbidities impacting occupational performance    Comorbidities impacting occupational performance description: see Subjective    Modification or Assistance to Complete Evaluation  Min-Moderate modification of tasks or assist with assess necessary to complete eval    OT Frequency 2x / week    OT Duration 12 weeks   and ongoing PRN for management phase CDT support   OT Treatment/Interventions Self-care/ADL training;DME and/or AE instruction;Manual lymph drainage;Therapeutic activities;Compression bandaging;Therapeutic exercise;Coping strategies training;Other (comment);Functional Mobility Training;Manual Therapy;Patient/family education   skin care with low ph castor oil during MLD   Plan Fit Pt w custom, knee length, adjustable CircAid compression leggings- calibrate to ccl 3. Provide MLD and compression bandaging until compression garments are fitted and Pt is able to don and doff with modified independence (extra time)    Recommended Other Services Pt will benefit from PT to improve balance, increase LB strength, increase endurance , and improve functional ambulation and mobility. Screening request submitted 12/11/20. Pt will benefit from advanced sequential pneumatic compression device, Tactile Medical Flexitouch, to limit  lymphedema progression and to better manage severe , stage III lymphedema at home over time since she is unable to perform self MLD 2/2 comorbidities    Consulted and Agree with Plan of Care Patient             Patient will benefit from skilled therapeutic intervention in order  to improve the following deficits and impairments:   Body Structure / Function / Physical Skills: ADL, Decreased knowledge of use of DME, Balance, Pain, Edema, ROM, IADL, Endurance, Scar mobility, Mobility, Decreased knowledge of precautions, Skin integrity, Strength       Visit Diagnosis: Lymphedema, not elsewhere classified    Problem List Patient Active Problem List   Diagnosis Date Noted   Sustained VT (ventricular tachycardia) 09/21/2019   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   Acute bronchitis 11/02/2016   Unstable angina ([place]) 14/27/6701   Chronic systolic heart failure ([place])    PVC's (premature ventricular contractions)    Ventricular fibrillation ([place]) 10/03/2015   Cardiac arrest ([place]) 10/03/2015   Essential hypertension 09/09/2015   Lymphedema 02/21/2015   Other specified hypothyroidism 02/21/2015    Andrey Spearman, MS, OTR/L, [facility] 06/06/21 10:41 AM   [place] MAIN [facility] SERVICES [address] [place], [place], [address] Phone: [phone]   Fax:  [phone]  Name: Lisa [name] MRN: [id number] Date of Birth: 11-19-1940

## 2021-06-09 ENCOUNTER — Ambulatory Visit: Payer: Medicare Other | Admitting: Occupational Therapy

## 2021-06-09 ENCOUNTER — Other Ambulatory Visit: Payer: Self-pay

## 2021-06-09 DIAGNOSIS — I890 Lymphedema, not elsewhere classified: Secondary | ICD-10-CM | POA: Diagnosis not present

## 2021-06-10 ENCOUNTER — Ambulatory Visit (INDEPENDENT_AMBULATORY_CARE_PROVIDER_SITE_OTHER): Payer: Medicare Other

## 2021-06-10 DIAGNOSIS — I5022 Chronic systolic (congestive) heart failure: Secondary | ICD-10-CM

## 2021-06-10 DIAGNOSIS — Z95810 Presence of automatic (implantable) cardiac defibrillator: Secondary | ICD-10-CM

## 2021-06-10 NOTE — Therapy (Signed)
[place] MAIN [facility] SERVICES [address] [place], [place], [address] Phone: [phone]   Fax:  [phone]  Occupational Therapy Treatment Note and Progress Report: Lymphedema Care  Patient Details  Name: Lisa [name] MRN: [id number] Date of Birth: Sep 20, 1940 Reporting Period: 05/06/21 - 06/09/21 No data recorded  Encounter Date: 06/09/2021   OT End of Session - 06/09/21 1000     Visit Number 20    Number of Visits 36    Date for OT Re-Evaluation 07/21/21    OT Start Time 1000    OT Stop Time 1115    OT Time Calculation (min) 75 min    Activity Tolerance Patient tolerated treatment well;Other (comment);Treatment limited secondary to medical complications (Comment)   Poor tolerance for [facility] device - caused pain in L foot, ankle and leg/knee   Behavior During Therapy [facility] for tasks assessed/performed             Past Medical History:  Diagnosis Date   CAD in native artery 2017   stent to LAD   Cardiac arrest ([place]) 09/2015   CHF (congestive heart failure) (HCC)    CML (chronic myelocytic leukemia) (HCC)    Complication of anesthesia    Hx: UTI (urinary tract infection)    Hypertension    Melanoma (HCC)    PONV (postoperative nausea and vomiting)    PVC (premature ventricular contraction)    HISTORY OF   Thyroid disease    V tach     Past Surgical History:  Procedure Laterality Date   ABDOMINAL HYSTERECTOMY     APPENDECTOMY     CARDIAC CATHETERIZATION N/A 10/03/2015   Procedure: Left Heart Cath and Coronary Angiography;  Surgeon: [name], MD;  Location: [place] CV LAB;  Service: Cardiovascular;  Laterality: N/A;   CARDIAC CATHETERIZATION N/A 10/07/2015   Procedure: Coronary Stent Intervention;  Surgeon: [name], MD;  Location: [place] CV LAB;  Service: Cardiovascular;  Laterality: N/A;   CARDIAC CATHETERIZATION  09/22/2019   EP IMPLANTABLE DEVICE N/A 01/20/2016   Procedure: BiV ICD  Insertion CRT-D;  Surgeon: [name], MD;  Location: [place] CV LAB;  Service: Cardiovascular;  Laterality: N/A;   LEFT HEART CATH AND CORONARY ANGIOGRAPHY N/A 09/22/2019   Procedure: LEFT HEART CATH AND CORONARY ANGIOGRAPHY;  Surgeon: Martinique, Peter M, MD;  Location: [place] CV LAB;  Service: Cardiovascular;  Laterality: N/A;   TONSILLECTOMY     TONSILLECTOMY     TUBAL LIGATION      There were no vitals filed for this visit.   Subjective Assessment - 06/09/21 0848     Subjective  [name] presents for OT Rx visit 20/36 to address severe BLE lymphedema (I89.0). Pt  presents without compression wraps in place . Pt denies leg pain this morning. Moderate lymphorrhea is observed bilaterally. Pt using ABD pads and wide CoBan to hold them in place. Skin is reddened and macerated.    Pertinent History CKD, Stage T0V/X7, Chronic systolic congestive heart failure, severe, stage II-Mild stage III BLE lymphedema ( onset in 20's), hypothyroidism, HTN, Ventricular fibrillation, pacer regularly interrogated, hx skin breakdown on leg, chronic myologeneous leukemia, Hx R leg melanoma w/ wide excision, s/p cardiac arrest 2017    Limitations chronic leg swelling and pain, decreased balance, decreased A/PROM , decreased lower body strength (unable to lift legs), impaired endurance, intermittent lymphorrhea, hx recurrent BLE cellulitis and non-pressure related wounds    Repetition Increases Symptoms  Special Tests 12/11/20 FOTO 48/100. 05/02/21 FOTO: 48/100    Patient Stated Goals reduce leg swelling, improve functional mobility and safe ambulation, improve skin condition, have better systemic fluid balance,    Pain Onset More than a month ago                          OT Treatments/Exercises (OP) - 06/10/21 0850       ADLs   ADL Education Given Yes      Manual Therapy   Manual Therapy Edema management;Compression Bandaging    Manual therapy comments applied non stick pad  with single layer of coban to distal anterior legs  covering lymphorrhea-induced skin irritation before applying compression wraps    Manual Lymphatic Drainage (MLD) MLD to LLE/LLW using typical, non-cancer related sequences, including J strokes to terminus only ( no neck strokes in keeping w thyroid precautions), functional inguinal LN and J strokes to thigh first, then medial knee. and lastly leg and foot, before returning 3 x from distal to proximal completing manual session with clabicals..    Compression Bandaging Applied multi layer, gradient compression wraps to BLE only, omitting feet.                    OT Education - 06/10/21 7619     Education Details Pt edu re effects of ongoing lymphorrhea-induced maceration on the skin. Pt instructed to return to wound care center   ASAP for evaluation ad treatment of non-pressure related leg wound to limit infection risk and worsening skin condition.    Person(s) Educated Patient    Methods Explanation;Demonstration;Handout    Comprehension Verbalized understanding;Returned demonstration;Need further instruction                 OT Long Term Goals - 06/10/21 0843       OT LONG TERM GOAL #1   Title Pt will be able to apply knee length, multi-layer, short stretch compression wraps to one leg using correct gradient techniques with Max caregiver assistance to decrease limb volume, to decrease leg pain and limit infection risk, and to improve safe functional ambulation and mobility.    Baseline dependent    Time 4    Period Days    Status Deferred   Pt does not have caregiver who can assist her daily with short stretch, gradient compression wraps between visits. Without daily compression prognosis for limb volume reduction is poor. 05/02/21: Wraps applied at clinical visits only.     OT LONG TERM GOAL #2   Title Pt will demonstrate understanding of lymphedema precautions  by being able to name and offer examples of 6 strategies  using a printed reference for PRN.    Baseline Max A    Period Days    Status Achieved      OT LONG TERM GOAL #3   Title Pt will achieve and sustain a least 85% compliance performing all daily LE self-care home program components throughout Intensive Phase CDT, including elevation when seated, recommended skin care regime, lymphatic pumping ther ex, 23/7 compression wraps and simple self-MLD, with Max caregiver assistance to ensure optimal limb volume reduction, to limit infection risk and to limit LE progression.    Baseline dependent    Time 4    Period Weeks    Status Deferred   05/02/21: Pt unable to apply wraps, to perform skininspection and skin care to distal legs and feet due to limited ROM  and body habitus. Pt has not complied with recommendation for CG.   Target Date --   Pt unable perform simple self MLD, to perform  essential skin and wound care. Pt unable to lift legs  to perorm most LE lymphatic pumping ther ex. Pt unable to reach legs   and feet to apply compression wraps. Pt has no assistance w LE self care     OT LONG TERM GOAL #4   Title Pt will achieve at least 10%  limb volume reductions bilaterally during Intensive Phase CDT to prevent re-accumulation of lymphatic congestion and progression of fibrosis, to limit infection risk, to improve functional ambulation and transfers, and  to improve functional performance of basic and instrumental ADLs, and to limit LE progression.    Baseline dependent    Time 12    Period Weeks    Status Partially Met   9:23/22 : 10TH VISIT LLE dec 4.2%; RLE DEC 1.3%.   Target Date 07/20/21      OT LONG TERM GOAL #5   Title By DC from OT Pt will be able to don and doff appropriate daytime compression garments and HOS devices with Max CG assistance to limit lymphatic re-accumulation and LE progression with before transitioning to self-management phase of CDT.    Baseline dependent    Time 12    Period Weeks    Status Revised    Target Date  07/20/21      OT LONG TERM GOAL #6   Title Pt will be able to don and doff alternative adjustable compression leggings with modified independence (extra time) to limit limb swelling and infection risk. Pt does not have CG available to assist her with any other available compression modality.   Custom, knee length, adjustable, compression garment alternatives have been ordered. Fitting planned upon delivery.   Baseline dependent    Time 4    Period Weeks    Status On-going    Target Date 07/20/21                   Plan - 06/09/21 0853     Clinical Impression Statement Please Review LONG TERM GOALS section of note for updated progress towards goals to date. Ongoing bilateral  leg lymphorrhea continues to cause skin redness and maceration because, despite ongoing compression wraping at clinical visits, Pt is unable to stay in compression between visits due to lack of assistance. She is unable to reach feet to perform most LE self care components, and this vcontinues to be her greatest obstacle to improvement in the condition of her legs and recurrent leg wounds and infections. Pt encouraged to keep these areas very clean and monitor for infection diligently. Provided LLE MLD and reapplied BLE  multilayer compression wraps as established using  ABD pads over areas of skin redness and maceration. Custom wrap-style, compression garment alternatives are ordered and will be fit as soon as delivered.  Focus of OT will shift to training for donnng and doffing at that point ans frequency will decrease. Pt will skip next week's scheduled OT visits and schedule wound care consult, or see her PCP for wound care.    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 Pt is unable to reach feet to apply essential multi-layer compression wraps .   Clinical Decision Making Several treatment options, min-mod task modification necessary    Comorbidities Affecting Occupational Performance: Presence of comorbidities impacting occupational performance    Comorbidities impacting occupational performance description: see Subjective    Modification or Assistance to Complete Evaluation  Min-Moderate modification of tasks or assist with assess necessary to complete eval    OT Frequency 2x / week    OT Duration 12 weeks   and ongoing PRN for management phase CDT support   OT Treatment/Interventions Self-care/ADL training;DME and/or AE instruction;Manual lymph drainage;Therapeutic activities;Compression bandaging;Therapeutic exercise;Coping strategies training;Other (comment);Functional Mobility Training;Manual Therapy;Patient/family education   skin care with low ph castor oil during MLD   Plan Fit Pt w custom, knee length, adjustable CircAid compression leggings- calibrate to ccl 3. Provide MLD and compression bandaging until compression garments are fitted and Pt is able to don and doff with modified independence (extra time)    Recommended Other Services Pt will benefit from PT to improve balance, increase LB strength, increase endurance , and improve functional ambulation and mobility. Screening request submitted 12/11/20. Pt will benefit from advanced sequential pneumatic compression device, Tactile Medical Flexitouch, to limit lymphedema progression and to better manage severe ,  stage III lymphedema at home over time since she is unable to perform self MLD 2/2 comorbidities    Consulted and Agree with Plan of Care Patient             Patient will benefit from skilled therapeutic intervention in order to improve the following deficits and impairments:   Body Structure / Function / Physical Skills: ADL, Decreased knowledge of use of DME, Balance, Pain, Edema, ROM, IADL, Endurance, Scar mobility, Mobility, Decreased knowledge of precautions, Skin integrity, Strength       Visit Diagnosis: Lymphedema, not elsewhere classified    Problem List Patient Active Problem List   Diagnosis Date Noted   Sustained VT (ventricular tachycardia) 09/21/2019   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

## 2021-06-10 NOTE — Progress Notes (Signed)
EPIC Encounter for ICM Monitoring  Patient Name: Lisa [name] is a 80 y.o. female Date: 06/10/2021 Primary Care Physican: [name], MD Primary Cardiologist: Harrington Challenger Electrophysiologist: Santina Evans Pacing: 98.3%         03/25/2021 Office Weight: 260 lbs                                                            Spoke with patient and heart failure questions reviewed.  Pt reports breathing has improved since fluid levels improved.   Optivol thoracic impedance trending close to baseline.  Fluid index threshold returned to normal.   Prescribed:  Torsemide Take 20 mg by mouth as directed. two days on one day off with potassium Potassium 10 mEq Take 10 mEq by mouth every other day. Per patient taking 2 tablets of 10 mEq to equal 20 mEq.   Labs: 04/04/2021 Creatinine 1.41, BUN 20, Potassium 4.1, Sodium 139, GFR 38 01/02/2021 Creatinine 1.44, BUN 20, Potassium 3.9, Sodium 142, GFR 37 11/16/2020 Creatinine 1.56, BUN 20, Potassium 4.6, Sodium 140, GFR 34 09/13/2020 Creatinine 1.32, BUN 21, Potassium 3.8, Sodium 141, GFR 41 A complete set of results can be found in Results Review.   Recommendations:  No changes and encouraged to call if experiencing any fluid symptoms.   Follow-up plan: ICM clinic phone appointment on 06/30/2021.   91 day device clinic remote transmission 07/30/2021.     EP/Cardiology Office Visits: 10/28/2021 with Dr. Harrington Challenger.  Recall 03/21/2022 with Dr Lovena Le.   Copy of ICM check sent to Dr. Lovena Le.     3 month ICM trend: 06/10/2021.    1 Year ICM trend:       [name], RN 06/10/2021 4:57 PM

## 2021-06-10 NOTE — Patient Instructions (Signed)

## 2021-06-13 ENCOUNTER — Other Ambulatory Visit: Payer: Self-pay

## 2021-06-13 ENCOUNTER — Ambulatory Visit: Payer: Medicare Other | Attending: Family Medicine | Admitting: Occupational Therapy

## 2021-06-13 DIAGNOSIS — I890 Lymphedema, not elsewhere classified: Secondary | ICD-10-CM | POA: Insufficient documentation

## 2021-06-13 NOTE — Therapy (Signed)
[facility] SERVICES [address] [place], [place], [address] Phone: [phone]   Fax:  [phone]  Occupational Therapy Treatment  Patient Details  Name: Lisa [name] MRN: [id number] Date of Birth: 09/19/1940 No data recorded  Encounter Date: 06/13/2021   OT End of Session - 06/13/21 1013     Visit Number 21    Number of Visits 36    Date for OT Re-Evaluation 07/21/21    OT Start Time 1005    OT Stop Time 1115    OT Time Calculation (min) 70 min    Activity Tolerance Patient tolerated treatment well;Other (comment);Treatment limited secondary to medical complications (Comment)   Poor tolerance for [facility] device - caused pain in L foot, ankle and leg/knee   Behavior During Therapy [facility] for tasks assessed/performed             Past Medical History:  Diagnosis Date   CAD in native artery 2017   stent to LAD   Cardiac arrest ([place]) 09/2015   CHF (congestive heart failure) (HCC)    CML (chronic myelocytic leukemia) (HCC)    Complication of anesthesia    Hx: UTI (urinary tract infection)    Hypertension    Melanoma (HCC)    PONV (postoperative nausea and vomiting)    PVC (premature ventricular contraction)    HISTORY OF   Thyroid disease    V tach     Past Surgical History:  Procedure Laterality Date   ABDOMINAL HYSTERECTOMY     APPENDECTOMY     CARDIAC CATHETERIZATION N/A 10/03/2015   Procedure: Left Heart Cath and Coronary Angiography;  Surgeon: [name], MD;  Location: [place] [facility];  Service: Cardiovascular;  Laterality: N/A;   CARDIAC CATHETERIZATION N/A 10/07/2015   Procedure: Coronary Stent Intervention;  Surgeon: [name], MD;  Location: [place] [facility];  Service: Cardiovascular;  Laterality: N/A;   CARDIAC CATHETERIZATION  09/22/2019   EP IMPLANTABLE DEVICE N/A 01/20/2016   Procedure: BiV ICD Insertion CRT-D;  Surgeon: [name], MD;  Location: [place] [facility];  Service:  Cardiovascular;  Laterality: N/A;   LEFT HEART CATH AND CORONARY ANGIOGRAPHY N/A 09/22/2019   Procedure: LEFT HEART CATH AND CORONARY ANGIOGRAPHY;  Surgeon: Martinique, Peter M, MD;  Location: [place] [facility];  Service: Cardiovascular;  Laterality: N/A;   TONSILLECTOMY     TONSILLECTOMY     TUBAL LIGATION      There were no vitals filed for this visit.   Subjective Assessment - 06/13/21 1014     Subjective  Lisa [name] presents for OT Rx visit 21/36 to address severe BLE lymphedema (I89.0). Pt  presents without compression wraps in place . Pt denies leg pain this morning. Moderate lymphorrhea with maceration persists. Pt reports she has an appointment for a medicated una boot next week.    Pertinent History CKD, Stage Q9U/T6, Chronic systolic congestive heart failure, severe, stage II-Mild stage III BLE lymphedema ( onset in 20's), hypothyroidism, HTN, Ventricular fibrillation, pacer regularly interrogated, hx skin breakdown on leg, chronic myologeneous leukemia, Hx R leg melanoma w/ wide excision, s/p cardiac arrest 2017    Limitations chronic leg swelling and pain, decreased balance, decreased A/PROM , decreased lower body strength (unable to lift legs), impaired endurance, intermittent lymphorrhea, hx recurrent BLE cellulitis and non-pressure related wounds    Repetition Increases Symptoms    Special Tests 12/11/20 FOTO 48/100. 05/02/21 FOTO: 48/100    Patient Stated  Goals reduce leg swelling, improve functional mobility and safe ambulation, improve skin condition, have better systemic fluid balance,    Pain Onset More than a month ago                 LYMPHEDEMA/ONCOLOGY QUESTIONNAIRE - 06/13/21 1140       Lymphedema Assessments   Lymphedema Assessments Lower extremities      Right Lower Extremity Lymphedema   Other RLE A-D limb volume = 10052.8 ml.    Other RLE A-D volume is further reduced by 12.6% since 05/02/21      Left Lower Extremity Lymphedema   Other LLE A-D volume  = 9954.2 ml    Other LLE A-D volume is reduced by 11.8% since 05/02/21.                     OT Treatments/Exercises (OP) - 06/13/21 1139       ADLs   ADL Education Given Yes      Manual Therapy   Manual Therapy Edema management;Compression Bandaging    Edema Management comparative limb volumetrics    Compression Bandaging Applied multi layer, gradient compression wraps to BLE only, omitting feet.                    OT Education - 06/13/21 1018     Education Details Continued Pt/ CG edu for lymphedema self care  and home program throughout session. Topics include multilayer, gradient compression wrapping, simple self-MLD, therapeutic lymphatic pumping exercises, skin/nail care, risk reduction factors and LE precautions, compression garments/recommendations and wear and care schedule and compression garment donning / doffing using assistive devices. All questions answered to the Pt's satisfaction, and Pt demonstrates understanding by report. Pt edu for Pandere expandable, adjustable shoes.    Person(s) Educated Patient    Methods Explanation;Demonstration;Handout    Comprehension Verbalized understanding;Returned demonstration;Need further instruction                 OT Long Term Goals - 06/10/21 0843       OT LONG TERM GOAL #1   Title Pt will be able to apply knee length, multi-layer, short stretch compression wraps to one leg using correct gradient techniques with Max caregiver assistance to decrease limb volume, to decrease leg pain and limit infection risk, and to improve safe functional ambulation and mobility.    Baseline dependent    Time 4    Period Days    Status Deferred   Pt does not have caregiver who can assist her daily with short stretch, gradient compression wraps between visits. Without daily compression prognosis for limb volume reduction is poor. 05/02/21: Wraps applied at clinical visits only.     OT LONG TERM GOAL #2   Title Pt will  demonstrate understanding of lymphedema precautions  by being able to name and offer examples of 6 strategies using a printed reference for PRN.    Baseline Max A    Period Days    Status Achieved      OT LONG TERM GOAL #3   Title Pt will achieve and sustain a least 85% compliance performing all daily LE self-care home program components throughout Intensive Phase CDT, including elevation when seated, recommended skin care regime, lymphatic pumping ther ex, 23/7 compression wraps and simple self-MLD, with Max caregiver assistance to ensure optimal limb volume reduction, to limit infection risk and to limit LE progression.    Baseline dependent    Time 4  Period Weeks    Status Deferred   05/02/21: Pt unable to apply wraps, to perform skininspection and skin care to distal legs and feet due to limited ROM    and body habitus. Pt has not complied with recommendation for CG.   Target Date --   Pt unable perform simple self MLD, to perform  essential skin and wound care. Pt unable to lift legs  to perorm most LE lymphatic pumping ther ex. Pt unable to reach legs   and feet to apply compression wraps. Pt has no assistance w LE self care     OT LONG TERM GOAL #4   Title Pt will achieve at least 10%  limb volume reductions bilaterally during Intensive Phase CDT to prevent re-accumulation of lymphatic congestion and progression of fibrosis, to limit infection risk, to improve functional ambulation and transfers, and  to improve functional performance of basic and instrumental ADLs, and to limit LE progression.    Baseline dependent    Time 12    Period Weeks    Status Partially Met   9:23/22 : 10TH VISIT LLE dec 4.2%; RLE DEC 1.3%.   Target Date 07/20/21      OT LONG TERM GOAL #5   Title By DC from OT Pt will be able to don and doff appropriate daytime compression garments and HOS devices with Max CG assistance to limit lymphatic re-accumulation and LE progression with before transitioning to  self-management phase of CDT.    Baseline dependent    Time 12    Period Weeks    Status Revised    Target Date 07/20/21      OT LONG TERM GOAL #6   Title Pt will be able to don and doff alternative adjustable compression leggings with modified independence (extra time) to limit limb swelling and infection risk. Pt does not have CG available to assist her with any other available compression modality.   Custom, knee length, adjustable, compression garment alternatives have been ordered. Fitting planned upon delivery.   Baseline dependent    Time 4    Period Weeks    Status On-going    Target Date 07/20/21                   Plan - 06/13/21 1134     Clinical Impression Statement Completed comparativce limb volumertrics, which reveal BLE limb volume reductions below the knees bilaterally. RLE limb volume from A-D (ankle to tibial tuberosity) is reduced by 11.8% since last measured on the 10th visiton 05/02/21. The LLE A-D volume is reduced 12.6% since 05/02/21. LLE skin of distal leg continues to "weep"  and skin maceration and redness is concerning. Pt will see PCP for medicated UNA boot next week. RLE remains essentially stable     in terms of skin condition.Reapplied compression wraps bilaterally after volumetrics due to time constraints, so no MLD as planned. Cont a per POC. Fit custom, adjustable, wrap-style CircAid knee high compression alternatives as soon as they are delivered.    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 Pt is unable to reach feet to apply essential multi-layer compression wraps .   Clinical Decision Making Several treatment options, min-mod task modification necessary    Comorbidities Affecting Occupational Performance: Presence of comorbidities impacting occupational performance    Comorbidities impacting occupational performance description: see Subjective    Modification or Assistance to Complete Evaluation  Min-Moderate modification of tasks or assist with assess necessary to complete eval    OT Frequency 2x / week    OT Duration 12 weeks   and ongoing PRN for management phase CDT support   OT Treatment/Interventions Self-care/ADL training;DME and/or AE instruction;Manual lymph drainage;Therapeutic activities;Compression bandaging;Therapeutic exercise;Coping strategies training;Other (comment);Functional Mobility Training;Manual Therapy;Patient/family education   skin care with low ph castor oil during MLD   Plan Fit Pt w custom, knee length, adjustable CircAid compression leggings- calibrate to ccl 3. Provide MLD and compression bandaging until compression garments are fitted and Pt is able to don and doff with modified independence (extra time)    Recommended Other Services Pt will benefit from PT to improve balance, increase LB strength, increase endurance , and improve functional ambulation and mobility. Screening request submitted 12/11/20. Pt will benefit from advanced sequential pneumatic compression device, Tactile Medical Flexitouch, to limit lymphedema progression and to better manage severe , stage III lymphedema at home over time since she is unable to perform self MLD 2/2 comorbidities    Consulted and Agree with Plan of Care Patient             Patient will benefit from skilled therapeutic intervention in  order to improve the following deficits and impairments:   Body Structure / Function / Physical Skills: ADL, Decreased knowledge of use of DME, Balance, Pain, Edema, ROM, IADL, Endurance, Scar mobility, Mobility, Decreased knowledge of precautions, Skin integrity, Strength       Visit Diagnosis: Lymphedema, not elsewhere classified    Problem List Patient Active Problem List   Diagnosis Date Noted   Sustained VT (ventricular tachycardia) 09/21/2019   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

## 2021-06-13 NOTE — Patient Instructions (Signed)

## 2021-06-18 ENCOUNTER — Other Ambulatory Visit: Payer: Self-pay

## 2021-06-18 ENCOUNTER — Ambulatory Visit (HOSPITAL_COMMUNITY): Payer: Medicare Other | Attending: Cardiology

## 2021-06-18 DIAGNOSIS — I5022 Chronic systolic (congestive) heart failure: Secondary | ICD-10-CM

## 2021-06-18 DIAGNOSIS — I255 Ischemic cardiomyopathy: Secondary | ICD-10-CM | POA: Diagnosis not present

## 2021-06-18 LAB — ECHOCARDIOGRAM COMPLETE
Area-P 1/2: 4.01 cm2
S' Lateral: 4.75 cm

## 2021-06-25 ENCOUNTER — Ambulatory Visit: Payer: Medicare Other | Admitting: Occupational Therapy

## 2021-06-27 ENCOUNTER — Ambulatory Visit: Payer: Medicare Other | Admitting: Occupational Therapy

## 2021-06-30 ENCOUNTER — Telehealth: Payer: Self-pay

## 2021-06-30 ENCOUNTER — Ambulatory Visit: Payer: Medicare Other

## 2021-06-30 DIAGNOSIS — I5022 Chronic systolic (congestive) heart failure: Secondary | ICD-10-CM

## 2021-06-30 DIAGNOSIS — Z95810 Presence of automatic (implantable) cardiac defibrillator: Secondary | ICD-10-CM

## 2021-06-30 NOTE — Telephone Encounter (Signed)
Remote ICM transmission received.  Attempted call to patient regarding ICM remote transmission and call was answered but no one speaking and then disconnected.

## 2021-06-30 NOTE — Progress Notes (Signed)
EPIC Encounter for ICM Monitoring  Patient Name: Lisa [name] is a 80 y.o. female Date: 06/30/2021 Primary Care Physican: [name], MD Primary Cardiologist: Harrington Challenger Electrophysiologist: Santina Evans Pacing: 98.4%         06/06/2021 Office Weight: 252 lbs  Time in AT/AF 0.0 hr/day (0.0%) Not on anticoagulation                                                            Attempted call to patient and unable to reach.  Transmission reviewed.    Optivol thoracic impedance suggesting possible fluid accumulation starting 11/15.   Prescribed:  Torsemide Take 20 mg by mouth as directed. two days on one day off with potassium Potassium 10 mEq Take 10 mEq by mouth every other day. Per patient taking 2 tablets of 10 mEq to equal 20 mEq.   Labs: 06/27/2021 Creatinine 1.34, BUN 11, Potassium 4.4, Sodium 139, GFR 40 04/04/2021 Creatinine 1.41, BUN 20, Potassium 4.1, Sodium 139, GFR 38 01/02/2021 Creatinine 1.44, BUN 20, Potassium 3.9, Sodium 142, GFR 37 11/16/2020 Creatinine 1.56, BUN 20, Potassium 4.6, Sodium 140, GFR 34 09/13/2020 Creatinine 1.32, BUN 21, Potassium 3.8, Sodium 141, GFR 41 A complete set of results can be found in Results Review.   Recommendations:  Unable to reach.     Follow-up plan: ICM clinic phone appointment on 07/07/2021 to recheck fluid levels.   91 day device clinic remote transmission 07/30/2021.     EP/Cardiology Office Visits: 10/28/2021 with Dr. Harrington Challenger.  Recall 03/21/2022 with Dr Lovena Le.   Copy of ICM check sent to Dr. Lovena Le.  Will send to Dr Harrington Challenger for review if patient is reached.  3 month ICM trend: 06/30/2021.    12-14 Month ICM trend:       [name], RN 06/30/2021 3:17 PM

## 2021-07-07 ENCOUNTER — Ambulatory Visit (INDEPENDENT_AMBULATORY_CARE_PROVIDER_SITE_OTHER): Payer: Medicare Other

## 2021-07-07 ENCOUNTER — Other Ambulatory Visit: Payer: Self-pay | Admitting: Internal Medicine

## 2021-07-07 DIAGNOSIS — I5022 Chronic systolic (congestive) heart failure: Secondary | ICD-10-CM

## 2021-07-07 DIAGNOSIS — Z95810 Presence of automatic (implantable) cardiac defibrillator: Secondary | ICD-10-CM

## 2021-07-07 MED ORDER — CARVEDILOL 25 MG PO TABS: 25.0000 mg | ORAL_TABLET | Freq: Two times a day (BID) | ORAL | 3 refills | Status: DC

## 2021-07-09 ENCOUNTER — Encounter: Payer: Medicare Other | Admitting: Occupational Therapy

## 2021-07-09 NOTE — Progress Notes (Signed)
EPIC Encounter for ICM Monitoring  Patient Name: Lisa [name] is a 80 y.o. female Date: 07/09/2021 Primary Care Physican: [name], MD Primary Cardiologist: Harrington Challenger Electrophysiologist: Santina Evans Pacing: 98.4%         06/06/2021 Office Weight: 252 lbs   Time in AT/AF  0.0 hr/day (0.0%) Not on anticoagulation                                                            Transmission reviewed.    Optivol thoracic impedance suggesting fluid levels returned to normal.   Prescribed:  Torsemide Take 20 mg by mouth as directed. two days on one day off with potassium Potassium 10 mEq Take 10 mEq by mouth every other day. Per patient taking 2 tablets of 10 mEq to equal 20 mEq.   Labs: 06/27/2021 Creatinine 1.34, BUN 11, Potassium 4.4, Sodium 139, GFR 40 04/04/2021 Creatinine 1.41, BUN 20, Potassium 4.1, Sodium 139, GFR 38 01/02/2021 Creatinine 1.44, BUN 20, Potassium 3.9, Sodium 142, GFR 37 11/16/2020 Creatinine 1.56, BUN 20, Potassium 4.6, Sodium 140, GFR 34 09/13/2020 Creatinine 1.32, BUN 21, Potassium 3.8, Sodium 141, GFR 41 A complete set of results can be found in Results Review.   Recommendations:  No changes.   Follow-up plan: ICM clinic phone appointment on 07/31/2021.   91 day device clinic remote transmission 07/30/2021.     EP/Cardiology Office Visits: 10/28/2021 with Dr. Harrington Challenger.  Recall 03/21/2022 with Dr Lovena Le.   Copy of ICM check sent to Dr. Lovena Le.   3 month ICM trend: 07/07/2021.    12-14 Month ICM trend:       [name], RN 07/09/2021 11:02 AM

## 2021-07-11 ENCOUNTER — Ambulatory Visit: Payer: Medicare Other | Attending: Family Medicine | Admitting: Occupational Therapy

## 2021-07-11 ENCOUNTER — Encounter: Payer: Medicare Other | Admitting: Occupational Therapy

## 2021-07-11 DIAGNOSIS — I890 Lymphedema, not elsewhere classified: Secondary | ICD-10-CM | POA: Insufficient documentation

## 2021-07-15 ENCOUNTER — Other Ambulatory Visit: Payer: Self-pay

## 2021-07-15 ENCOUNTER — Ambulatory Visit: Payer: Medicare Other | Admitting: Occupational Therapy

## 2021-07-15 DIAGNOSIS — I890 Lymphedema, not elsewhere classified: Secondary | ICD-10-CM | POA: Diagnosis not present

## 2021-07-15 NOTE — Therapy (Addendum)
[place] MAIN [facility] SERVICES [address] [place], [place], [address] Phone: [phone]   Fax:  [phone]  Occupational Therapy Treatment  Patient Details  Name: Lisa [name] MRN: [id number] Date of Birth: Apr 22, 1941 No data recorded  Encounter Date: 07/15/2021   OT End of Session - 07/15/21 1532     Visit Number 22    Number of Visits 36    Date for OT Re-Evaluation 07/21/21    OT Start Time 1100    OT Stop Time 1210    OT Time Calculation (min) 70 min    Activity Tolerance Patient tolerated treatment well;No increased pain   Poor tolerance for Entre device - caused pain in L foot, ankle and leg/knee   Behavior During Therapy [facility] for tasks assessed/performed             Past Medical History:  Diagnosis Date   CAD in native artery 2017   stent to LAD   Cardiac arrest ([place]) 09/2015   CHF (congestive heart failure) (HCC)    CML (chronic myelocytic leukemia) (HCC)    Complication of anesthesia    Hx: UTI (urinary tract infection)    Hypertension    Melanoma (HCC)    PONV (postoperative nausea and vomiting)    PVC (premature ventricular contraction)    HISTORY OF   Thyroid disease    V tach     Past Surgical History:  Procedure Laterality Date   ABDOMINAL HYSTERECTOMY     APPENDECTOMY     CARDIAC CATHETERIZATION N/A 10/03/2015   Procedure: Left Heart Cath and Coronary Angiography;  Surgeon: [name], MD;  Location: [place] CV LAB;  Service: Cardiovascular;  Laterality: N/A;   CARDIAC CATHETERIZATION N/A 10/07/2015   Procedure: Coronary Stent Intervention;  Surgeon: [name], MD;  Location: [place] CV LAB;  Service: Cardiovascular;  Laterality: N/A;   CARDIAC CATHETERIZATION  09/22/2019   EP IMPLANTABLE DEVICE N/A 01/20/2016   Procedure: BiV ICD Insertion CRT-D;  Surgeon: [name], MD;  Location: [place] CV LAB;  Service: Cardiovascular;  Laterality: N/A;   LEFT HEART CATH AND  CORONARY ANGIOGRAPHY N/A 09/22/2019   Procedure: LEFT HEART CATH AND CORONARY ANGIOGRAPHY;  Surgeon: Martinique, Peter M, MD;  Location: [place] CV LAB;  Service: Cardiovascular;  Laterality: N/A;   TONSILLECTOMY     TONSILLECTOMY     TUBAL LIGATION      There were no vitals filed for this visit.   Subjective Assessment - 07/15/21 1533     Subjective  Lisa [name] presents for OT Rx visit 22/36 to address severe BLE lymphedema (I89.0). Pt  presents without compression wraps in place .Pt denies leg pain, but agrees with OT assessment that she has increased swelling. She reports she's seen her PCP for 2 x weekly una boots since last seen here for lymphedema. She reports skin is healed and legs are no longer "weeping". She reports it has been a week since she had compression on her legs. Her custom wrap style compression garment alternatives, or CircAids are here for fitting today.    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/G2, Chronic systolic congestive heart failure, severe, stage II-Mild stage III BLE lymphedema ( onset in 20's), hypothyroidism, HTN, Ventricular fibrillation, pacer regularly interrogated, hx skin breakdown on leg, chronic myologeneous leukemia, Hx R leg melanoma w/ wide excision, s/p cardiac arrest 2017    Limitations chronic leg swelling and pain, decreased balance, decreased A/PROM , decreased  lower body strength (unable to lift legs), impaired endurance, intermittent lymphorrhea, hx recurrent BLE cellulitis and non-pressure related wounds    Repetition Increases Symptoms    Special Tests 12/11/20 FOTO 48/100. 05/02/21 FOTO: 48/100    Patient Stated Goals reduce leg swelling, improve functional mobility and safe ambulation, improve skin condition, have better systemic fluid balance,    Currently in Pain? No/denies    Pain Onset More than a month ago                          OT Treatments/Exercises (OP) - 07/15/21 1551       ADLs   ADL Education Given Yes       Manual Therapy   Manual Therapy Edema management;Compression Bandaging    Edema Management fitted BLE custom CircAid  compression garments bilaterally                    OT Education - 07/15/21 1552     Education Details Pt edu for donning, positioning, guaging compression for 30-40 mmHg, and doffing custom, mid thigh length CircAid    wrap style compression garment alternatives. Pt able to apply CircAids correctly with extra time using guage to adjust tabs for correct 30-40 mmHg (ccl 2). She required a foot stool to attach last 2 straps at distal leg.    Person(s) Educated Patient    Methods Explanation;Demonstration;Handout    Comprehension Verbalized understanding;Returned demonstration;Need further instruction                 OT Long Term Goals - 06/10/21 0843       OT LONG TERM GOAL #1   Title Pt will be able to apply knee length, multi-layer, short stretch compression wraps to one leg using correct gradient techniques with Max caregiver assistance to decrease limb volume, to decrease leg pain and limit infection risk, and to improve safe functional ambulation and mobility.    Baseline dependent    Time 4    Period Days    Status Deferred   Pt does not have caregiver who can assist her daily with short stretch, gradient compression wraps between visits. Without daily compression prognosis for limb volume reduction is poor. 05/02/21: Wraps applied at clinical visits only.     OT LONG TERM GOAL #2   Title Pt will demonstrate understanding of lymphedema precautions  by being able to name and offer examples of 6 strategies using a printed reference for PRN.    Baseline Max A    Period Days    Status Achieved      OT LONG TERM GOAL #3   Title Pt will achieve and sustain a least 85% compliance performing all daily LE self-care home program components throughout Intensive Phase CDT, including elevation when seated, recommended skin care regime, lymphatic pumping ther ex,  23/7 compression wraps and simple self-MLD, with Max caregiver assistance to ensure optimal limb volume reduction, to limit infection risk and to limit LE progression.    Baseline dependent    Time 4    Period Weeks    Status Deferred   05/02/21: Pt unable to apply wraps, to perform skininspection and skin care to distal legs and feet due to limited ROM    and body habitus. Pt has not complied with recommendation for CG.   Target Date --   Pt unable perform simple self MLD, to perform  essential skin and wound care. Pt unable to  lift legs  to perorm most LE lymphatic pumping ther ex. Pt unable to reach legs   and feet to apply compression wraps. Pt has no assistance w LE self care     OT LONG TERM GOAL #4   Title Pt will achieve at least 10%  limb volume reductions bilaterally during Intensive Phase CDT to prevent re-accumulation of lymphatic congestion and progression of fibrosis, to limit infection risk, to improve functional ambulation and transfers, and  to improve functional performance of basic and instrumental ADLs, and to limit LE progression.    Baseline dependent    Time 12    Period Weeks    Status Partially Met   9:23/22 : 10TH VISIT LLE dec 4.2%; RLE DEC 1.3%.   Target Date 07/20/21      OT LONG TERM GOAL #5   Title By DC from OT Pt will be able to don and doff appropriate daytime compression garments and HOS devices with Max CG assistance to limit lymphatic re-accumulation and LE progression with before transitioning to self-management phase of CDT.    Baseline dependent    Time 12    Period Weeks    Status Revised    Target Date 07/20/21      OT LONG TERM GOAL #6   Title Pt will be able to don and doff alternative adjustable compression leggings with modified independence (extra time) to limit limb swelling and infection risk. Pt does not have CG available to assist her with any other available compression modality.   Custom, knee length, adjustable, compression garment  alternatives have been ordered. Fitting planned upon delivery.   Baseline dependent    Time 4    Period Weeks    Status On-going    Target Date 07/20/21                   Plan - 07/15/21 1532     Clinical Impression Statement Pt's legs are more swollen today than when last seen for lymphedema care on 11/4. Skin breakdown due to chronic maceration and lymphorrhea is improved today. Despite ongoing CDT, including MLD, therapeutic exercise, slkin care, compression and elevation, and sometimes concurrent wound care, Pt's swelling and skin condition continue to present with significant symptoms of severe , stage III, BLE lymphedema. Lymphedema , in this case,  has proven intractible to conservative treatment. Emphasis of this session on fitting CircAid, adjustable compression garment alternatives.Unfortunately DME vendor sent mid thigh instead of needed knee-length garments. I reviewing order documentation OT  correctly  selected   "bilateral knee length and sent knee length measurements. But "on the the order form cover sheet OT mistakenly seleted ""lower leg w/ knee".  No one caught the discrepancy and the longer length devices arrived. The garments appear to fit relatively well except for slight bunching behind the knees and lower edge above ankles. Pt edu for donning, positioning, guaging compression for 30-40 mmHg, and doffing custom, mid thigh length CircAid    wrap style compression garment alternatives. Pt able to apply CircAids correctly with extra time using guage to adjust tabs for correct 30-40 mmHg (ccl 2). She required a foot stool to attach last 2 straps at distal leg. Pt was concerened after session when walking out of clinic that the longer lenth interfeers with her gait. On observing gait from clinic to elevator ( > 500 ft) Pt did not demonstrate loss of balance  or difficulty lifting legs and feet to clear carpet. She agreed to try  the longer length at home and if she feels these  absolutely will not work, we will get them remade to knee high. A trial will give [place] the best insight. OT emailed DME vendor and let her know of the problems and pending remake. Cont as per POC.   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 Pt is unable to reach feet to apply essential multi-layer compression wraps .   Clinical Decision Making Several treatment options, min-mod task modification necessary    Comorbidities Affecting Occupational Performance: Presence of comorbidities impacting occupational performance    Comorbidities impacting occupational performance description: see Subjective    Modification or Assistance to Complete Evaluation  Min-Moderate modification of tasks or assist with assess necessary to complete eval    OT Frequency 2x / week    OT Duration 12 weeks   and ongoing PRN for management phase CDT support   OT Treatment/Interventions Self-care/ADL training;DME and/or AE instruction;Manual lymph drainage;Therapeutic activities;Compression bandaging;Therapeutic exercise;Coping strategies training;Other (comment);Functional Mobility Training;Manual Therapy;Patient/family education   skin care with low ph castor oil during MLD   Plan Fit Pt w custom, knee length, adjustable CircAid compression leggings- calibrate to ccl 3. Provide MLD and compression  bandaging until compression garments are fitted and Pt is able to don and doff with modified independence (extra time)    Recommended Other Services Pt will benefit from PT to improve balance, increase LB strength, increase endurance , and improve functional ambulation and mobility. Screening request submitted 12/11/20. Pt will benefit from advanced sequential pneumatic compression device, Tactile Medical Flexitouch, to limit lymphedema progression and to better manage severe , stage III lymphedema at home over time since she is unable to perform self MLD 2/2 comorbidities    Consulted and Agree with Plan of Care Patient             Patient will benefit from skilled therapeutic intervention in order to improve the following deficits and impairments:   Body Structure / Function / Physical Skills: ADL, Decreased knowledge of use of DME, Balance, Pain, Edema, ROM, IADL, Endurance, Scar mobility, Mobility, Decreased knowledge of precautions, Skin integrity, Strength       Visit Diagnosis: Lymphedema, not elsewhere classified    Problem List Patient Active Problem List   Diagnosis Date Noted   Sustained VT (ventricular tachycardia) 09/21/2019   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   Acute bronchitis 11/02/2016   Unstable angina ([place]) 24/40/1027   Chronic systolic heart failure ([place])    PVC's (premature ventricular contractions)    Ventricular fibrillation ([place]) 10/03/2015   Cardiac arrest ([place]) 10/03/2015   Essential hypertension 09/09/2015   Lymphedema 02/21/2015   Other specified hypothyroidism 02/21/2015    Andrey Spearman, MS, OTR/L, CLT-LANA 07/15/21 3:57 PM   [place] MAIN [facility] SERVICES [address] [place], [place], [address] Phone: [phone]   Fax:  [phone]  Name: Lisa [name] MRN: [id number] Date of Birth: [birth year]

## 2021-07-15 NOTE — Patient Instructions (Signed)

## 2021-07-22 ENCOUNTER — Ambulatory Visit: Payer: Medicare Other | Admitting: Occupational Therapy

## 2021-07-28 ENCOUNTER — Ambulatory Visit: Payer: Medicare Other | Admitting: Occupational Therapy

## 2021-07-30 ENCOUNTER — Ambulatory Visit (INDEPENDENT_AMBULATORY_CARE_PROVIDER_SITE_OTHER): Payer: Medicare Other

## 2021-07-30 DIAGNOSIS — I255 Ischemic cardiomyopathy: Secondary | ICD-10-CM

## 2021-07-30 LAB — CUP PACEART REMOTE DEVICE CHECK
Battery Remaining Longevity: 27 mo
Battery Voltage: 2.95 V
Brady Statistic AP VP Percent: 0.49 %
Brady Statistic AP VS Percent: 0.03 %
Brady Statistic AS VP Percent: 97.83 %
Brady Statistic AS VS Percent: 1.65 %
Brady Statistic RA Percent Paced: 0.52 %
Brady Statistic RV Percent Paced: 0.28 %
Date Time Interrogation Session: 20221221033523
HighPow Impedance: 46 Ohm
Implantable Lead Implant Date: 20170612
Implantable Lead Implant Date: 20170612
Implantable Lead Implant Date: 20170612
Implantable Lead Location: 753858
Implantable Lead Location: 753859
Implantable Lead Location: 753860
Implantable Lead Model: 4398
Implantable Lead Model: 5076
Implantable Pulse Generator Implant Date: 20170612
Lead Channel Impedance Value: 142.5 Ohm
Lead Channel Impedance Value: 169.459
Lead Channel Impedance Value: 169.459
Lead Channel Impedance Value: 178.125
Lead Channel Impedance Value: 178.125
Lead Channel Impedance Value: 247 Ohm
Lead Channel Impedance Value: 285 Ohm
Lead Channel Impedance Value: 285 Ohm
Lead Channel Impedance Value: 285 Ohm
Lead Channel Impedance Value: 399 Ohm
Lead Channel Impedance Value: 418 Ohm
Lead Channel Impedance Value: 475 Ohm
Lead Channel Impedance Value: 475 Ohm
Lead Channel Impedance Value: 589 Ohm
Lead Channel Impedance Value: 608 Ohm
Lead Channel Impedance Value: 646 Ohm
Lead Channel Impedance Value: 646 Ohm
Lead Channel Impedance Value: 703 Ohm
Lead Channel Pacing Threshold Amplitude: 0.875 V
Lead Channel Pacing Threshold Amplitude: 1.125 V
Lead Channel Pacing Threshold Amplitude: 1.5 V
Lead Channel Pacing Threshold Pulse Width: 0.4 ms
Lead Channel Pacing Threshold Pulse Width: 0.4 ms
Lead Channel Pacing Threshold Pulse Width: 0.8 ms
Lead Channel Sensing Intrinsic Amplitude: 2.875 mV
Lead Channel Sensing Intrinsic Amplitude: 2.875 mV
Lead Channel Sensing Intrinsic Amplitude: 3.875 mV
Lead Channel Sensing Intrinsic Amplitude: 3.875 mV
Lead Channel Setting Pacing Amplitude: 2 V
Lead Channel Setting Pacing Amplitude: 2.25 V
Lead Channel Setting Pacing Amplitude: 2.5 V
Lead Channel Setting Pacing Pulse Width: 0.4 ms
Lead Channel Setting Pacing Pulse Width: 0.8 ms
Lead Channel Setting Sensing Sensitivity: 0.3 mV

## 2021-07-31 ENCOUNTER — Ambulatory Visit (INDEPENDENT_AMBULATORY_CARE_PROVIDER_SITE_OTHER): Payer: Medicare Other

## 2021-07-31 DIAGNOSIS — I5022 Chronic systolic (congestive) heart failure: Secondary | ICD-10-CM

## 2021-07-31 DIAGNOSIS — Z95810 Presence of automatic (implantable) cardiac defibrillator: Secondary | ICD-10-CM

## 2021-07-31 NOTE — Progress Notes (Signed)
EPIC Encounter for ICM Monitoring  Patient Name: Lisa [name] is a 80 y.o. female Date: 07/31/2021 Primary Care Physican: [name], MD Primary Cardiologist: Harrington Challenger Electrophysiologist: Santina Evans Pacing: 98.2%         06/06/2021 Office Weight: 252 lbs   Time in AT/AF  0.0 hr/day (0.0%) Not on anticoagulation                                                            Transmission reviewed.    Optivol thoracic impedance suggesting normal fluid levels.   Prescribed:  Torsemide Take 20 mg by mouth as directed. two days on one day off with potassium Potassium 10 mEq Take 10 mEq by mouth every other day. Per patient taking 2 tablets of 10 mEq to equal 20 mEq.   Labs: 06/27/2021 Creatinine 1.34, BUN 11, Potassium 4.4, Sodium 139, GFR 40 04/04/2021 Creatinine 1.41, BUN 20, Potassium 4.1, Sodium 139, GFR 38 01/02/2021 Creatinine 1.44, BUN 20, Potassium 3.9, Sodium 142, GFR 37 11/16/2020 Creatinine 1.56, BUN 20, Potassium 4.6, Sodium 140, GFR 34 09/13/2020 Creatinine 1.32, BUN 21, Potassium 3.8, Sodium 141, GFR 41 A complete set of results can be found in Results Review.   Recommendations:  No changes.   Follow-up plan: ICM clinic phone appointment on 09/01/2021.   91 day device clinic remote transmission 10/29/2021.     EP/Cardiology Office Visits: 10/28/2021 with Dr. Harrington Challenger.  Recall 03/21/2022 with Dr Lovena Le.   Copy of ICM check sent to Dr. Lovena Le.   3 month ICM trend: 07/31/2021.    12-14 Month ICM trend:       [name], RN 07/31/2021 1:00 PM

## 2021-08-06 ENCOUNTER — Encounter: Payer: Self-pay | Admitting: Internal Medicine

## 2021-08-06 MED ORDER — AMIODARONE HCL 200 MG PO TABS: 200.0000 mg | ORAL_TABLET | Freq: Every day | ORAL | 3 refills | Status: DC

## 2021-08-08 NOTE — Progress Notes (Signed)
Remote ICD transmission.   

## 2021-08-27 ENCOUNTER — Telehealth: Payer: Self-pay

## 2021-08-27 DIAGNOSIS — I472 Ventricular tachycardia, unspecified: Secondary | ICD-10-CM

## 2021-08-27 NOTE — Telephone Encounter (Signed)
Reviewed with Dr. Quentin Ore. Verbal order obtained for BMP, Magnesium and OV tomorrow. Scheduled with Dr. Lovena Le 08/28/21 @ 12:00. Called patient and updated. Advised if any further events or ICD shock to go to ED for evaluation, not to drive. Verbalized understanding.

## 2021-08-27 NOTE — Telephone Encounter (Signed)
Patient reports of using the bathroom (urinating only) and felt dizzy suddenly then diaphoretic.  States she has felt well toady. Reports of edema in lower extremities which is normal for patient, none increased noted. Reports compliance with medications including Amiodarone 200 mg daily, coreg 25 mg BID, torsemide 20 mg daily.   Advised patient I will review with Dr. Quentin Ore this afternoon after he comes into the office. Advised if she receives a shock to go to the ED, call 911, do not drive. Verbalized understanding.

## 2021-08-28 ENCOUNTER — Encounter: Payer: Self-pay | Admitting: Internal Medicine

## 2021-08-28 ENCOUNTER — Other Ambulatory Visit: Payer: Medicare Other

## 2021-08-28 ENCOUNTER — Other Ambulatory Visit: Payer: Self-pay

## 2021-08-28 ENCOUNTER — Ambulatory Visit (INDEPENDENT_AMBULATORY_CARE_PROVIDER_SITE_OTHER): Payer: Medicare Other | Admitting: Internal Medicine

## 2021-08-28 VITALS — BP 114/60 | HR 66 | Ht 62.0 in | Wt 250.0 lb

## 2021-08-28 DIAGNOSIS — I255 Ischemic cardiomyopathy: Secondary | ICD-10-CM

## 2021-08-28 DIAGNOSIS — Z95810 Presence of automatic (implantable) cardiac defibrillator: Secondary | ICD-10-CM | POA: Diagnosis not present

## 2021-08-28 DIAGNOSIS — I4901 Ventricular fibrillation: Secondary | ICD-10-CM | POA: Diagnosis not present

## 2021-08-28 LAB — BASIC METABOLIC PANEL
BUN/Creatinine Ratio: 14 (ref 12–28)
BUN: 17 mg/dL (ref 8–27)
CO2: 24 mmol/L (ref 20–29)
Calcium: 8.3 mg/dL — ABNORMAL LOW (ref 8.7–10.3)
Chloride: 107 mmol/L — ABNORMAL HIGH (ref 96–106)
Creatinine, Ser: 1.24 mg/dL — ABNORMAL HIGH (ref 0.57–1.00)
Glucose: 102 mg/dL — ABNORMAL HIGH (ref 70–99)
Potassium: 3.9 mmol/L (ref 3.5–5.2)
Sodium: 143 mmol/L (ref 134–144)
eGFR: 44 mL/min/{1.73_m2} — ABNORMAL LOW (ref >59)

## 2021-08-28 LAB — MAGNESIUM: Magnesium: 1.8 mg/dL (ref 1.6–2.3)

## 2021-08-28 NOTE — Progress Notes (Signed)
HPI Lisa [name] returns today for followup. She is a pleasant morbidly obese 81 yo woman with chronic systolic heart failure, PAF, and VT. She is s/p Biv ICD insertion. She remains on amiodarone 200 mg daily. She is fairly sedentary. No additional syncope or ICD therapies. 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 Allergies  Allergen Reactions   Flagyl [Metronidazole] Itching, Rash and Other (See Comments)    Welts, also   Penicillins Hives, Itching and Rash    Has patient had a PCN reaction causing immediate rash, facial/tongue/throat swelling, SOB or lightheadedness with hypotension: Yes  Has patient had a PCN reaction causing severe rash involving mucus membranes or skin necrosis: Yes Has patient had a PCN reaction that required hospitalization No Has patient had a PCN reaction occurring within the last 10 years: NO If all of the above answers are "NO", then may proceed with Cephalosporin use.    Sulfamethoxazole Rash   Meperidine Nausea And Vomiting   Morphine And Related Nausea And Vomiting   Tape Other (See Comments)    "THE PLASTIC, CLEAR TAPE CAN/DOES PULL OFF MY SKIN."   Doxycycline Nausea Only, Rash and Other (See Comments)    Made her "feel terrible"   Lanolin Itching and Rash   Sulfa Antibiotics Rash     Current Outpatient Medications  Medication Sig Dispense Refill   acetaminophen (TYLENOL) 500 MG tablet Take 500-1,000 mg by mouth every 6 (six) hours as needed for moderate pain.     allopurinol (ZYLOPRIM) 100 MG tablet Take 100 mg by mouth daily.      amiodarone (PACERONE) 200 MG tablet Take 1 tablet (200 mg total) by mouth daily. 90 tablet 3   aspirin EC 81 MG tablet Take 81 mg by mouth at bedtime.      atorvastatin (LIPITOR) 20 MG tablet Take 1 tablet (20 mg total) by mouth daily. 90 tablet 3   calcium carbonate (OS-CAL - DOSED IN MG OF ELEMENTAL CALCIUM) 1250 (500 Ca) MG tablet Take 1 tablet by mouth.     carvedilol (COREG) 25 MG tablet  Take 1 tablet (25 mg total) by mouth 2 (two) times daily. 180 tablet 3   Cholecalciferol (VITAMIN D-3 PO) Take 5,000 Units by mouth daily.     ferrous sulfate 325 (65 FE) MG EC tablet Take 1 tablet by mouth once a week.     imatinib (GLEEVEC) 400 MG tablet Take 400 mg by mouth daily with supper.      Inulin (METAMUCIL CLEAR & NATURAL PO) Take by mouth See admin instructions. Mix 1 teaspoonful into 8 ounces of water and drink as needed     Magnesium Oxide 400 MG CAPS Take 1 capsule (400 mg total) by mouth 2 (two) times daily. 180 capsule 1   Multiple Vitamin (MULTIVITAMIN WITH MINERALS) TABS tablet Take 1 tablet by mouth daily.     nitroGLYCERIN (NITROSTAT) 0.4 MG SL tablet Place 1 tablet (0.4 mg total) under the tongue every 5 (five) minutes x 3 doses as needed for chest pain. 25 tablet 3   potassium chloride (KLOR-CON) 10 MEQ tablet Take 10 mEq by mouth every other day. Per patient taking 2 tablets of 10 mEq to equal 20 mEq.     silver sulfADIAZINE (SILVADENE) 1 % cream Apply topically.     thyroid (ARMOUR) 15 MG tablet Take 15 mg by mouth daily before breakfast.      torsemide (DEMADEX) 20 MG tablet Take 1 tablet (  20 mg total) by mouth as directed. (Patient taking differently: Take 20 mg by mouth as directed. two days on one day off with potassium) 90 tablet 3   triamcinolone ointment (KENALOG) 0.1 % Apply a small amount to the affected area bid     No current facility-administered medications for this visit.     Past Medical History:  Diagnosis Date   CAD in native artery 2017   stent to LAD   Cardiac arrest ([place]) 09/2015   CHF (congestive heart failure) (HCC)    CML (chronic myelocytic leukemia) (HCC)    Complication of anesthesia    Hx: UTI (urinary tract infection)    Hypertension    Melanoma (HCC)    PONV (postoperative nausea and vomiting)    PVC (premature ventricular contraction)    HISTORY OF   Thyroid disease    V tach     ROS:   All systems reviewed and negative  except as noted in the HPI.   Past Surgical History:  Procedure Laterality Date   ABDOMINAL HYSTERECTOMY     APPENDECTOMY     CARDIAC CATHETERIZATION N/A 10/03/2015   Procedure: Left Heart Cath and Coronary Angiography;  Surgeon: [name], MD;  Location: [place] CV LAB;  Service: Cardiovascular;  Laterality: N/A;   CARDIAC CATHETERIZATION N/A 10/07/2015   Procedure: Coronary Stent Intervention;  Surgeon: [name], MD;  Location: [place] CV LAB;  Service: Cardiovascular;  Laterality: N/A;   CARDIAC CATHETERIZATION  09/22/2019   EP IMPLANTABLE DEVICE N/A 01/20/2016   Procedure: BiV ICD Insertion CRT-D;  Surgeon: [name], MD;  Location: [place] CV LAB;  Service: Cardiovascular;  Laterality: N/A;   LEFT HEART CATH AND CORONARY ANGIOGRAPHY N/A 09/22/2019   Procedure: LEFT HEART CATH AND CORONARY ANGIOGRAPHY;  Surgeon: Martinique, Peter M, MD;  Location: [place] CV LAB;  Service: Cardiovascular;  Laterality: N/A;   TONSILLECTOMY     TONSILLECTOMY     TUBAL LIGATION       Family History  Problem Relation Age of Onset   Coronary artery disease Mother    Thyroid disease Mother    Multiple myeloma Father    Heart attack Maternal Grandfather    Heart attack Paternal Grandfather      Social History   Socioeconomic History   Marital status: Divorced    Spouse name: Not on file   Number of children: Not on file   Years of education: Not on file   Highest education level: Not on file  Occupational History   Not on file  Tobacco Use   Smoking status: Never   Smokeless tobacco: Never  Vaping Use   Vaping Use: Never used  Substance and Sexual Activity   Alcohol use: No    Alcohol/week: 0.0 standard drinks   Drug use: No   Sexual activity: Not on file  Other Topics Concern   Not on file  Social History Narrative   Not on file   Social Determinants of Health   Financial Resource Strain: Not on file  Food Insecurity: Not on file   Transportation Needs: Not on file  Physical Activity: Not on file  Stress: Not on file  Social Connections: Not on file  Intimate Partner Violence: Not on file     BP 114/60    Pulse 66    Ht '5\' 2"'  (1.575 m)    Wt 250 lb (113.4 kg)    LMP 02/14/1995    SpO2 96%  BMI 45.73 kg/m   Physical Exam:  Well appearing NAD HEENT: Unremarkable Neck:  No JVD, no thyromegally Lymphatics:  No adenopathy Back:  No CVA tenderness Lungs:  Clear with no wheezes HEART:  Regular rate rhythm, no murmurs, no rubs, no clicks Abd:  soft, positive bowel sounds, no organomegally, no rebound, no guarding Ext:  2 plus pulses, 3+ edema, no cyanosis, no clubbing Skin:  No rashes no nodules Neuro:  CN II through XII intact, motor grossly intact  EKG - nsr  DEVICE  Normal device function.  See PaceArt for details.   Assess/Plan:  VF - she has had a single episode which stopped spontaneously. Continue amiodarone. PAF - she has not had any. Continue curren meds. Chronic systolic heart failure - her symptoms remain class 2. We discussed elevation of the legs. Her dose of diuretics is limited by CRI. ICD - her medtronic biv ICD is working normally. No change.  Carleene Overlie Maelee Hoot,MD

## 2021-08-28 NOTE — Patient Instructions (Addendum)
Medication Instructions:  Your physician recommends that you continue on your current medications as directed. Please refer to the Current Medication list given to you today.  Labwork: You will get lab work today:  BMP and mag  Testing/Procedures: None ordered.  Follow-Up: Your physician wants you to follow-up in: one year with Cristopher [name], MD or one of the following Advanced Practice Providers on your designated Care Team:   Tommye Standard, [place] Legrand Como "Jonni Sanger" Chalmers Cater, [place]  Remote monitoring is used to monitor your ICD from home. This monitoring reduces the number of office visits required to check your device to one time per year. It allows [place] to keep an eye on the functioning of your device to ensure it is working properly. You are scheduled for a device check from home on 09/01/2021. You may send your transmission at any time that day. If you have a wireless device, the transmission will be sent automatically. After your physician reviews your transmission, you will receive a postcard with your next transmission date.  Any Other Special Instructions Will Be Listed Below (If Applicable).  If you need a refill on your cardiac medications before your next appointment, please call your pharmacy.

## 2021-09-01 ENCOUNTER — Ambulatory Visit (INDEPENDENT_AMBULATORY_CARE_PROVIDER_SITE_OTHER): Payer: Medicare Other

## 2021-09-01 DIAGNOSIS — Z95810 Presence of automatic (implantable) cardiac defibrillator: Secondary | ICD-10-CM | POA: Diagnosis not present

## 2021-09-01 DIAGNOSIS — I5022 Chronic systolic (congestive) heart failure: Secondary | ICD-10-CM | POA: Diagnosis not present

## 2021-09-02 NOTE — Progress Notes (Signed)
EPIC Encounter for ICM Monitoring  Patient Name: Lisa [name] is a 81 y.o. female Date: 09/02/2021 Primary Care Physican: [name], MD Primary Cardiologist: Harrington Challenger Electrophysiologist: Santina Evans Pacing: 98.1%         09/02/2021 Weight: 250 lbs   Time in AT/AF  0.0 hr/day (0.0%) Not on anticoagulation                                                            Spoke with patient and heart failure questions reviewed.  Pt asymptomatic for fluid accumulation.  Reports feeling well at this time and voices no complaints.    Optivol thoracic impedance suggesting normal fluid levels.   Prescribed:  Torsemide Take 20 mg by mouth as directed. two days on one day off with potassium Potassium 10 mEq Take 10 mEq by mouth every other day. Per patient taking 2 tablets of 10 mEq to equal 20 mEq.   Labs: 08/28/2021 Creatinine 1.24, BUN 17, Potassium 3.9, Sodium 143, GFR 44 06/27/2021 Creatinine 1.34, BUN 11, Potassium 4.4, Sodium 139, GFR 40 04/04/2021 Creatinine 1.41, BUN 20, Potassium 4.1, Sodium 139, GFR 38 01/02/2021 Creatinine 1.44, BUN 20, Potassium 3.9, Sodium 142, GFR 37 11/16/2020 Creatinine 1.56, BUN 20, Potassium 4.6, Sodium 140, GFR 34 09/13/2020 Creatinine 1.32, BUN 21, Potassium 3.8, Sodium 141, GFR 41 A complete set of results can be found in Results Review.   Recommendations:  No changes and encouraged to call if experiencing any fluid symptoms.   Follow-up plan: ICM clinic phone appointment on 10/06/2021.   91 day device clinic remote transmission 10/29/2021.     EP/Cardiology Office Visits: 10/28/2021 with Dr. Harrington Challenger.  Recall 03/21/2022 with Dr Lovena Le.   Copy of ICM check sent to Dr. Lovena Le.  3 month ICM trend: 09/01/2021.    12-14 Month ICM trend:     [name], RN 09/02/2021 12:40 PM

## 2021-09-11 ENCOUNTER — Other Ambulatory Visit (HOSPITAL_COMMUNITY): Payer: Self-pay | Admitting: Student

## 2021-09-11 DIAGNOSIS — L97919 Non-pressure chronic ulcer of unspecified part of right lower leg with unspecified severity: Secondary | ICD-10-CM

## 2021-09-11 DIAGNOSIS — I739 Peripheral vascular disease, unspecified: Secondary | ICD-10-CM

## 2021-09-15 ENCOUNTER — Ambulatory Visit (HOSPITAL_COMMUNITY)
Admission: RE | Admit: 2021-09-15 | Discharge: 2021-09-15 | Disposition: A | Discharge status: Home / Self Care | Payer: Medicare Other | Source: Ambulatory Visit | Attending: Student | Admitting: Student

## 2021-09-15 ENCOUNTER — Ambulatory Visit (INDEPENDENT_AMBULATORY_CARE_PROVIDER_SITE_OTHER)
Admission: RE | Admit: 2021-09-15 | Discharge: 2021-09-15 | Disposition: A | Discharge status: Home / Self Care | Payer: Medicare Other | Source: Ambulatory Visit | Attending: Student | Admitting: Student

## 2021-09-15 ENCOUNTER — Other Ambulatory Visit: Payer: Self-pay

## 2021-09-15 DIAGNOSIS — L97929 Non-pressure chronic ulcer of unspecified part of left lower leg with unspecified severity: Secondary | ICD-10-CM | POA: Insufficient documentation

## 2021-09-15 DIAGNOSIS — L97919 Non-pressure chronic ulcer of unspecified part of right lower leg with unspecified severity: Secondary | ICD-10-CM

## 2021-09-15 DIAGNOSIS — I739 Peripheral vascular disease, unspecified: Secondary | ICD-10-CM

## 2021-10-06 ENCOUNTER — Ambulatory Visit (INDEPENDENT_AMBULATORY_CARE_PROVIDER_SITE_OTHER): Payer: Medicare Other

## 2021-10-06 DIAGNOSIS — Z95810 Presence of automatic (implantable) cardiac defibrillator: Secondary | ICD-10-CM | POA: Diagnosis not present

## 2021-10-06 DIAGNOSIS — I5022 Chronic systolic (congestive) heart failure: Secondary | ICD-10-CM

## 2021-10-08 NOTE — Progress Notes (Signed)
EPIC Encounter for ICM Monitoring  Patient Name: Lisa [name] is a 81 y.o. female Date: 10/08/2021 Primary Care Physican: [name], MD Primary Cardiologist: Harrington Challenger Electrophysiologist: Santina Evans Pacing: 98.2%         09/02/2021 Weight: 250 lbs   Time in AT/AF  0.0 hr/day (0.0%) Not on anticoagulation                                                            Spoke with patient and heart failure questions reviewed.  Pt asymptomatic for fluid accumulation.  Going to wound clinic for lymphedema.   Optivol thoracic impedance suggesting normal fluid levels.   Prescribed:  Torsemide Take 20 mg by mouth as directed. two days on one day off with potassium Potassium 10 mEq Take 10 mEq by mouth every other day. Per patient taking 2 tablets of 10 mEq to equal 20 mEq.   Labs: 09/26/2021 Creatinine 1.29, BUN 21, Potassium 4.4, Sodium 141, GFR 42 08/28/2021 Creatinine 1.24, BUN 17, Potassium 3.9, Sodium 143, GFR 44 06/27/2021 Creatinine 1.34, BUN 11, Potassium 4.4, Sodium 139, GFR 40 04/04/2021 Creatinine 1.41, BUN 20, Potassium 4.1, Sodium 139, GFR 38 01/02/2021 Creatinine 1.44, BUN 20, Potassium 3.9, Sodium 142, GFR 37 11/16/2020 Creatinine 1.56, BUN 20, Potassium 4.6, Sodium 140, GFR 34 09/13/2020 Creatinine 1.32, BUN 21, Potassium 3.8, Sodium 141, GFR 41 A complete set of results can be found in Results Review.   Recommendations:  No changes and encouraged to call if experiencing any fluid symptoms.   Follow-up plan: ICM clinic phone appointment on 11/10/2021.   91 day device clinic remote transmission 10/29/2021.     EP/Cardiology Office Visits: 10/28/2021 with Dr. Harrington Challenger.  Recall 03/21/2022 with Dr Lovena Le.   Copy of ICM check sent to Dr. Lovena Le.  3 month ICM trend: 10/06/2021.    12-14 Month ICM trend:     [name], RN 10/08/2021 1:59 PM

## 2021-10-27 NOTE — Progress Notes (Signed)
?  ? ?Cardiology Office Note ? ? ?Date:  10/28/2021  ? ?ID:  Lisa [name], DOB 10/06/1940, MRN [id number] ? ?PCP:  [name], MD  ?Cardiologist:   [name], MD  ? ?F/U of CHF   ? ?  ?History of Present Illness:F ?Lisa [name] is a 81 y.o. 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 Lisinopril stopped in past due to hyperkalemia   ? ?In February 2021 the patient was admitted to the hospital with an episode of VT and ICD therapy.  Cardiac catheterization was done.  This showed a 65% ramus narrowing.  LVEDP was moderately elevated at 23.[name]  LVEF was 50 to 55%.  She was seen by EP.  Placed on amiodarone. ? ?I saw the pt in clinic in May 2022.   SHe was seen by [name] in Aug 2022 ?The pt says that she had a remote check last week that showed her fluid was up   Had some nauseated, vomited  Now better    ?Has appt on 11/1 ? ?Breathing is OK   Activity remains lmited  ?Legs swollen but she says they seem a little better    Just wrapped today   ? ?She was last in clinic in Fall 2022  She saw [name] in Jan 2023 ? ?She says her breathing is OK   Denies CP   no dizziness    ?She says her BP has been a little high   150/ at recent wound center checks     ?Continues to have legs wrapped  ? ?Current Meds  ?Medication Sig  ? acetaminophen (TYLENOL) 500 MG tablet Take 500-1,000 mg by mouth every 6 (six) hours as needed for moderate pain.  ? allopurinol (ZYLOPRIM) 100 MG tablet Take 100 mg by mouth daily.   ? amiodarone (PACERONE) 200 MG tablet Take 1 tablet (200 mg total) by mouth daily.  ? aspirin EC 81 MG tablet Take 81 mg by mouth at bedtime.   ? atorvastatin (LIPITOR) 20 MG tablet Take 1 tablet (20 mg total) by mouth daily.  ? b complex  vitamins capsule Take 1 capsule by mouth daily.  ? calcium carbonate (OS-CAL - DOSED IN MG OF ELEMENTAL CALCIUM) 1250 (500 Ca) MG tablet Take 1 tablet by mouth.  ? carvedilol (COREG) 25 MG tablet Take 1 tablet (25 mg total) by mouth 2 (two) times daily.  ? Cholecalciferol (VITAMIN D-3 PO) Take 5,000 Units by mouth daily.  ? ferrous sulfate 325 (65 FE) MG EC tablet Take 1 tablet by mouth 3 (three) times a week.  ? hydrALAZINE (APRESOLINE) 10 MG tablet Take 1 tablet (10 mg total) by mouth in the morning and at bedtime.  ? imatinib (GLEEVEC) 400 MG tablet Take 400 mg by mouth daily with supper.   ? Inulin (METAMUCIL CLEAR & NATURAL PO) Take by mouth See admin instructions. Mix 1 teaspoonful into 8 ounces of water and drink as needed  ? Loperamide HCl 1 MG/7.5ML LIQD Take by mouth as needed.  ? Magnesium Oxide 400 MG CAPS Take 1 capsule (400 mg total) by mouth 2 (two) times daily.  ? Multiple Vitamin (MULTIVITAMIN WITH MINERALS) TABS tablet Take 1 tablet  by mouth daily.  ? nitroGLYCERIN (NITROSTAT) 0.4 MG SL tablet Place 1 tablet (0.4 mg total) under the tongue every 5 (five) minutes x 3 doses as needed for chest pain.  ? potassium chloride (KLOR-CON) 10 MEQ tablet Take 10 mEq by mouth as directed. Take with Torsemide  ? silver sulfADIAZINE (SILVADENE) 1 % cream Apply topically.  ? thyroid (ARMOUR) 15 MG tablet Take 15 mg by mouth daily before breakfast.   ? torsemide (DEMADEX) 20 MG tablet Take 1 tablet (20 mg total) by mouth as directed. (Patient taking differently: Take 20 mg by mouth as directed. two days on one day off with potassium)  ? triamcinolone ointment (KENALOG) 0.1 % Apply a small amount to the affected area bid  ? vitamin B-12 (CYANOCOBALAMIN) 500 MCG tablet Take 500 mcg by mouth daily.  ? vitamin C (ASCORBIC ACID) 500 MG tablet Take 500 mg by mouth daily.  ? ? ? ?Allergies:   Flagyl [metronidazole], Penicillins, Sulfamethoxazole, Meperidine, Morphine and related, Propoxyphene, Tape, Doxycycline, Lanolin,  and Sulfa antibiotics  ? ?Past Medical History:  ?Diagnosis Date  ? CAD in native artery 2017  ? stent to LAD  ? Cardiac arrest ([place]) 09/2015  ? CHF (congestive heart failure) ([place])   ? CML (chronic myelocytic leukemia) ([place])   ? Complication of anesthesia   ? Hx: UTI (urinary tract infection)   ? Hypertension   ? Melanoma ([place])   ? PONV (postoperative nausea and vomiting)   ? PVC (premature ventricular contraction)   ? HISTORY OF  ? Thyroid disease   ? V tach   ? ? ?Past Surgical History:  ?Procedure Laterality Date  ? ABDOMINAL HYSTERECTOMY    ? APPENDECTOMY    ? CARDIAC CATHETERIZATION N/A 10/03/2015  ? Procedure: Left Heart Cath and Coronary Angiography;  Surgeon: [name], MD;  Location: [facility];  Service: Cardiovascular;  Laterality: N/A;  ? CARDIAC CATHETERIZATION N/A 10/07/2015  ? Procedure: Coronary Stent Intervention;  Surgeon: [name], MD;  Location: [facility];  Service: Cardiovascular;  Laterality: N/A;  ? CARDIAC CATHETERIZATION  09/22/2019  ? EP IMPLANTABLE DEVICE N/A 01/20/2016  ? Procedure: BiV ICD Insertion CRT-D;  Surgeon: [name], MD;  Location: [facility];  Service: Cardiovascular;  Laterality: N/A;  ? LEFT HEART CATH AND CORONARY ANGIOGRAPHY N/A 09/22/2019  ? Procedure: LEFT HEART CATH AND CORONARY ANGIOGRAPHY;  Surgeon: Martinique, Peter M, MD;  Location: [facility];  Service: Cardiovascular;  Laterality: N/A;  ? TONSILLECTOMY    ? TONSILLECTOMY    ? TUBAL LIGATION    ? ? ? ?Social History:  The patient  reports that she has never smoked. She has never used smokeless tobacco. She reports that she does not drink alcohol and does not use drugs.  ? ?Family History:  The patient's family history includes Coronary artery disease in her mother; Heart attack in her maternal grandfather and paternal grandfather; Multiple myeloma in her father; Thyroid disease in her mother.  ? ? ?ROS:  Please see the history of present illness. All other systems  are reviewed and  Negative to the above problem except as noted.  ? ? ?PHYSICAL EXAM: ?VS:  BP (!) 147/68   Pulse 62   Ht '5\' 2"'  (1.575 m)   Wt 242 lb (109.8 kg)   LMP 02/14/1995   SpO2 99%   BMI 44.26 kg/m?   ?GEN: Morbidly obese 81 yo in no acute distress  Examined in chair ?HEENT: normal  ?Neck: Neck full  No obvious JVD   ?\Cardiac: RRR; no murmurs   ?Lungs Cleart to auscultation ?Abdomen   Obese   ?Leg:   Chronic lymphedema of legs  Legs are again wrapped  ?Skin: warm and dry ? ?EKG:  EKG is not done today   ? ?Echo  Nov 2022 ? ?There is turbulence in distal outflow tract which is contributing to murmur (no ?significant gradiet). Left ventricular ejection fraction, by estimation, is 45 to 50%. The ?left ventricle has mildly decreased function. The left ventricle demonstrates global ?hypokinesis. The left ventricular internal cavity size was mildly to moderately dilated. ?There is mild left ventricular hypertrophy of the posterior segment. Left ventricular ?diastolic parameters are consistent with Grade II diastolic dysfunction ?(pseudonormalization). ?1. ?Right ventricular systolic function is normal. The right ventricular size is normal. ?There is normal pulmonary artery systolic pressure. ?2. ?3. Left atrial size was severely dilated. ?4. Right atrial size was mildly dilated. ?The mitral valve is normal in structure. Moderate mitral valve regurgitation. No ?evidence of mitral stenosis. ?5. ?The aortic valve is tricuspid. There is moderate calcification of the aortic valve. There ?is mild thickening of the aortic valve. Aortic valve regurgitation is not visualized. Mild ?to moderate aortic valve sclerosis/calcification is present, without any evidence of ?aortic stenosis. ?6. ?The inferior vena cava is dilated in size with >50% respiratory variability, suggesting ?right atrial pressure of 8 mmHg. ? ?Echo   9.22.2Lipid Panel ?   ?Component Value Date/Time  ? CHOL 92 (L) 10/06/2019 1059  ? TRIG 92  10/06/2019 1059  ? HDL 39 (L) 10/06/2019 1059  ? CHOLHDL 2.4 10/06/2019 1059  ? CHOLHDL 4.2 10/04/2015 0329  ? VLDL 18 10/04/2015 0329  ? [place] 35 10/06/2019 1059  ? ?  ? ?Wt Readings from Last 3 Encounters:  ?10/28/21

## 2021-10-28 ENCOUNTER — Other Ambulatory Visit: Payer: Self-pay

## 2021-10-28 ENCOUNTER — Ambulatory Visit (INDEPENDENT_AMBULATORY_CARE_PROVIDER_SITE_OTHER): Payer: Medicare Other | Admitting: Internal Medicine

## 2021-10-28 ENCOUNTER — Encounter: Payer: Self-pay | Admitting: Internal Medicine

## 2021-10-28 VITALS — BP 147/68 | HR 62 | Ht 62.0 in | Wt 242.0 lb

## 2021-10-28 DIAGNOSIS — I255 Ischemic cardiomyopathy: Secondary | ICD-10-CM

## 2021-10-28 DIAGNOSIS — I5043 Acute on chronic combined systolic (congestive) and diastolic (congestive) heart failure: Secondary | ICD-10-CM | POA: Diagnosis not present

## 2021-10-28 MED ORDER — HYDRALAZINE HCL 10 MG PO TABS: 10.0000 mg | ORAL_TABLET | Freq: Two times a day (BID) | ORAL | 6 refills | Status: DC

## 2021-10-28 NOTE — Patient Instructions (Signed)
Medication Instructions:  ?Hydralazine 10 mg twice a day  ?*If you need a refill on your cardiac medications before your next appointment, please call your pharmacy* ? ? ?Lab Work: ?none ?If you have labs (blood work) drawn today and your tests are completely normal, you will receive your results only by: ?MyChart Message (if you have MyChart) OR ?A paper copy in the mail ?If you have any lab test that is abnormal or we need to change your treatment, we will call you to review the results. ? ? ?Testing/Procedures: ?none ? ? ?Follow-Up: ?At [facility], you and your health needs are our priority.  As part of our continuing mission to provide you with exceptional heart care, we have created designated Provider Care Teams.  These Care Teams include your primary Cardiologist (physician) and Advanced Practice Providers (APPs -  Physician Assistants and Nurse Practitioners) who all work together to provide you with the care you need, when you need it. ? ?We recommend signing up for the patient portal called "MyChart".  Sign up information is provided on this After Visit Summary.  MyChart is used to connect with patients for Virtual Visits (Telemedicine).  Patients are able to view lab/test results, encounter notes, upcoming appointments, etc.  Non-urgent messages can be sent to your provider as well.   ?To learn more about what you can do with MyChart, go to [email or web address].   ? ?Your next appointment:   ?6 month(s) ? ?The format for your next appointment:   ?In Person ? ?Provider:   ?[name], MD   ? ? ?Other Instructions ?  ?

## 2021-10-29 ENCOUNTER — Ambulatory Visit (INDEPENDENT_AMBULATORY_CARE_PROVIDER_SITE_OTHER): Payer: Medicare Other

## 2021-10-29 DIAGNOSIS — I5022 Chronic systolic (congestive) heart failure: Secondary | ICD-10-CM | POA: Diagnosis not present

## 2021-10-29 LAB — CUP PACEART REMOTE DEVICE CHECK
Battery Remaining Longevity: 26 mo
Battery Voltage: 2.93 V
Brady Statistic AP VP Percent: 0.2 %
Brady Statistic AP VS Percent: 0.03 %
Brady Statistic AS VP Percent: 98.15 %
Brady Statistic AS VS Percent: 1.63 %
Brady Statistic RA Percent Paced: 0.23 %
Brady Statistic RV Percent Paced: 0.06 %
Date Time Interrogation Session: 20230322033525
HighPow Impedance: 42 Ohm
Implantable Lead Implant Date: 20170612
Implantable Lead Implant Date: 20170612
Implantable Lead Implant Date: 20170612
Implantable Lead Location: 753858
Implantable Lead Location: 753859
Implantable Lead Location: 753860
Implantable Lead Model: 4398
Implantable Lead Model: 5076
Implantable Pulse Generator Implant Date: 20170612
Lead Channel Impedance Value: 123.5 Ohm
Lead Channel Impedance Value: 155.257
Lead Channel Impedance Value: 155.257
Lead Channel Impedance Value: 155.257
Lead Channel Impedance Value: 155.257
Lead Channel Impedance Value: 228 Ohm
Lead Channel Impedance Value: 247 Ohm
Lead Channel Impedance Value: 247 Ohm
Lead Channel Impedance Value: 304 Ohm
Lead Channel Impedance Value: 418 Ohm
Lead Channel Impedance Value: 418 Ohm
Lead Channel Impedance Value: 418 Ohm
Lead Channel Impedance Value: 456 Ohm
Lead Channel Impedance Value: 551 Ohm
Lead Channel Impedance Value: 551 Ohm
Lead Channel Impedance Value: 589 Ohm
Lead Channel Impedance Value: 608 Ohm
Lead Channel Impedance Value: 608 Ohm
Lead Channel Pacing Threshold Amplitude: 0.75 V
Lead Channel Pacing Threshold Amplitude: 1.125 V
Lead Channel Pacing Threshold Amplitude: 1.125 V
Lead Channel Pacing Threshold Pulse Width: 0.4 ms
Lead Channel Pacing Threshold Pulse Width: 0.4 ms
Lead Channel Pacing Threshold Pulse Width: 0.8 ms
Lead Channel Sensing Intrinsic Amplitude: 2.625 mV
Lead Channel Sensing Intrinsic Amplitude: 2.625 mV
Lead Channel Sensing Intrinsic Amplitude: 3.75 mV
Lead Channel Sensing Intrinsic Amplitude: 3.75 mV
Lead Channel Setting Pacing Amplitude: 1.75 V
Lead Channel Setting Pacing Amplitude: 2.25 V
Lead Channel Setting Pacing Amplitude: 2.5 V
Lead Channel Setting Pacing Pulse Width: 0.4 ms
Lead Channel Setting Pacing Pulse Width: 0.8 ms
Lead Channel Setting Sensing Sensitivity: 0.3 mV

## 2021-11-10 ENCOUNTER — Ambulatory Visit (INDEPENDENT_AMBULATORY_CARE_PROVIDER_SITE_OTHER): Payer: Medicare Other

## 2021-11-10 DIAGNOSIS — Z95810 Presence of automatic (implantable) cardiac defibrillator: Secondary | ICD-10-CM | POA: Diagnosis not present

## 2021-11-10 DIAGNOSIS — I5022 Chronic systolic (congestive) heart failure: Secondary | ICD-10-CM

## 2021-11-11 NOTE — Progress Notes (Signed)
Remote ICD transmission.   

## 2021-11-12 NOTE — Progress Notes (Signed)
EPIC Encounter for ICM Monitoring ? ?Patient Name: Lisa [name] is a 81 y.o. female ?Date: 11/12/2021 ?Primary Care Physican: [name], MD ?Primary Cardiologist: Harrington Challenger ?Electrophysiologist: Lovena Le ?Bi-V Pacing: 98.2%         ?09/02/2021 Weight: 250 lbs ?11/12/2021 Weight: 248 lbs ?  ?Time in AT/AF  0.0 hr/day (0.0%) Not on anticoagulation ?                                                         ?  ?Spoke with patient and heart failure questions reviewed.  Pt asymptomatic for fluid accumulation.   ?  ?Optivol thoracic impedance suggesting normal fluid levels. ?  ?Prescribed:  ?Torsemide Take 20 mg by mouth as directed. two days on one day off with potassium ?Potassium 10 mEq Take 10 mEq by mouth every other day. Per patient taking 2 tablets of 10 mEq to equal 20 mEq. ?  ?Labs: ?09/26/2021 Creatinine 1.29, BUN 21, Potassium 4.4, Sodium 141, GFR 42 ?08/28/2021 Creatinine 1.24, BUN 17, Potassium 3.9, Sodium 143, GFR 44 ?06/27/2021 Creatinine 1.34, BUN 11, Potassium 4.4, Sodium 139, GFR 40 ?04/04/2021 Creatinine 1.41, BUN 20, Potassium 4.1, Sodium 139, GFR 38 ?01/02/2021 Creatinine 1.44, BUN 20, Potassium 3.9, Sodium 142, GFR 37 ?11/16/2020 Creatinine 1.56, BUN 20, Potassium 4.6, Sodium 140, GFR 34 ?09/13/2020 Creatinine 1.32, BUN 21, Potassium 3.8, Sodium 141, GFR 41 ?A complete set of results can be found in Results Review. ?  ?Recommendations:  No changes and encouraged to call if experiencing any fluid symptoms. ?  ?Follow-up plan: ICM clinic phone appointment on 12/15/2021.   91 day device clinic remote transmission 01/28/2022.   ?  ?EP/Cardiology Office Visits: 04/29/2022 with Dr. Harrington Challenger.  Recall 03/21/2022 with Dr Lovena Le. ?  ?Copy of ICM check sent to Dr. Lovena Le. ? ?3 month ICM trend: 11/10/2021. ? ? ? ?12-14 Month ICM trend:  ? ? ? ?[name], RN ?11/12/2021 ?12:29 PM ? ?

## 2021-11-28 ENCOUNTER — Encounter: Payer: Self-pay | Admitting: Internal Medicine

## 2021-11-28 DIAGNOSIS — I5022 Chronic systolic (congestive) heart failure: Secondary | ICD-10-CM

## 2021-12-01 MED ORDER — HYDRALAZINE HCL 10 MG PO TABS: 10.0000 mg | ORAL_TABLET | Freq: Two times a day (BID) | ORAL | 6 refills | Status: DC

## 2021-12-01 MED ORDER — CARVEDILOL 25 MG PO TABS: 25.0000 mg | ORAL_TABLET | Freq: Two times a day (BID) | ORAL | 3 refills | Status: DC

## 2021-12-15 ENCOUNTER — Ambulatory Visit (INDEPENDENT_AMBULATORY_CARE_PROVIDER_SITE_OTHER): Payer: Medicare Other

## 2021-12-15 DIAGNOSIS — Z95810 Presence of automatic (implantable) cardiac defibrillator: Secondary | ICD-10-CM | POA: Diagnosis not present

## 2021-12-15 DIAGNOSIS — I5022 Chronic systolic (congestive) heart failure: Secondary | ICD-10-CM | POA: Diagnosis not present

## 2021-12-17 NOTE — Progress Notes (Signed)
EPIC Encounter for ICM Monitoring ? ?Patient Name: Lisa [name] is a 81 y.o. female ?Date: 12/17/2021 ?Primary Care Physican: [name], MD ?Primary Cardiologist: Harrington Challenger ?Electrophysiologist: Lovena Le ?Bi-V Pacing: 98.1%         ?09/02/2021 Weight: 250 lbs ?11/12/2021 Weight: 248 lbs ?  ?Time in AT/AF  0.0 hr/day (0.0%) Not on anticoagulation ?                                                         ?  ?Spoke with patient and heart failure questions reviewed.  Pt asymptomatic for fluid accumulation.   ?  ?Optivol thoracic impedance suggesting normal fluid levels. ?  ?Prescribed:  ?Torsemide Take 20 mg by mouth as directed. two days on one day off with potassium ?Potassium 10 mEq Take 10 mEq by mouth every other day. Per patient taking 2 tablets of 10 mEq to equal 20 mEq. ?  ?Labs: ?09/26/2021 Creatinine 1.29, BUN 21, Potassium 4.4, Sodium 141, GFR 42 ?08/28/2021 Creatinine 1.24, BUN 17, Potassium 3.9, Sodium 143, GFR 44 ?06/27/2021 Creatinine 1.34, BUN 11, Potassium 4.4, Sodium 139, GFR 40 ?04/04/2021 Creatinine 1.41, BUN 20, Potassium 4.1, Sodium 139, GFR 38 ?01/02/2021 Creatinine 1.44, BUN 20, Potassium 3.9, Sodium 142, GFR 37 ?11/16/2020 Creatinine 1.56, BUN 20, Potassium 4.6, Sodium 140, GFR 34 ?09/13/2020 Creatinine 1.32, BUN 21, Potassium 3.8, Sodium 141, GFR 41 ?A complete set of results can be found in Results Review. ?  ?Recommendations:  No changes and encouraged to call if experiencing any fluid symptoms. ?  ?Follow-up plan: ICM clinic phone appointment on 01/19/2022.   91 day device clinic remote transmission 01/28/2022.   ?  ?EP/Cardiology Office Visits: 04/29/2022 with Dr. Harrington Challenger.  Recall 03/21/2022 with Dr Lovena Le. ?  ?Copy of ICM check sent to Dr. Lovena Le.  ? ?3 month ICM trend: 12/15/2021. ? ? ? ?12-14 Month ICM trend:  ? ? ? ?[name], RN ?12/17/2021 ?2:19 PM ? ?

## 2022-01-19 ENCOUNTER — Ambulatory Visit (INDEPENDENT_AMBULATORY_CARE_PROVIDER_SITE_OTHER): Payer: Medicare Other

## 2022-01-19 DIAGNOSIS — Z95810 Presence of automatic (implantable) cardiac defibrillator: Secondary | ICD-10-CM

## 2022-01-19 DIAGNOSIS — I5022 Chronic systolic (congestive) heart failure: Secondary | ICD-10-CM | POA: Diagnosis not present

## 2022-01-21 NOTE — Progress Notes (Signed)
EPIC Encounter for ICM Monitoring  Patient Name: Lisa [name] is a 81 y.o. female Date: 01/21/2022 Primary Care Physican: [name], MD Primary Cardiologist: Harrington Challenger Electrophysiologist: Santina Evans Pacing: 98.2%         09/02/2021 Weight: 250 lbs 11/12/2021 Weight: 248 lbs 01/21/2022 Weight: 246 lbs   Time in AT/AF  0.0 hr/day (0.0%) Not on anticoagulation                                                            Spoke with patient and heart failure questions reviewed.  Pt asymptomatic for fluid accumulation.     Optivol thoracic impedance suggesting normal fluid levels.   Prescribed:  Torsemide Take 20 mg by mouth as directed. two days on one day off with potassium Potassium 10 mEq Take 10 mEq by mouth every other day. Per patient taking 2 tablets of 10 mEq to equal 20 mEq.   Labs: 12/26/2021 Creatinine 1.16, BUN 15, Potassium 4.1, Sodium 142, GFR 48 09/26/2021 Creatinine 1.29, BUN 21, Potassium 4.4, Sodium 141, GFR 42 08/28/2021 Creatinine 1.24, BUN 17, Potassium 3.9, Sodium 143, GFR 44 A complete set of results can be found in Results Review.   Recommendations:  No changes and encouraged to call if experiencing any fluid symptoms.   Follow-up plan: ICM clinic phone appointment on 02/23/2022.   91 day device clinic remote transmission 04/29/2022.     EP/Cardiology Office Visits: 04/29/2022 with Dr. Harrington Challenger.  Recall 03/21/2022 with Dr Lovena Le.   Copy of ICM check sent to Dr. Lovena Le.   3 month ICM trend: 01/19/2022.    12-14 Month ICM trend:     [name], RN 01/21/2022 9:34 AM

## 2022-01-28 ENCOUNTER — Ambulatory Visit (INDEPENDENT_AMBULATORY_CARE_PROVIDER_SITE_OTHER): Payer: Medicare Other

## 2022-01-28 DIAGNOSIS — I255 Ischemic cardiomyopathy: Secondary | ICD-10-CM

## 2022-01-29 LAB — CUP PACEART REMOTE DEVICE CHECK
Battery Remaining Longevity: 27 mo
Battery Voltage: 2.93 V
Brady Statistic AP VP Percent: 0.18 %
Brady Statistic AP VS Percent: 0.03 %
Brady Statistic AS VP Percent: 98.17 %
Brady Statistic AS VS Percent: 1.63 %
Brady Statistic RA Percent Paced: 0.2 %
Brady Statistic RV Percent Paced: 0.06 %
Date Time Interrogation Session: 20230621033624
HighPow Impedance: 44 Ohm
Implantable Lead Implant Date: 20170612
Implantable Lead Implant Date: 20170612
Implantable Lead Implant Date: 20170612
Implantable Lead Location: 753858
Implantable Lead Location: 753859
Implantable Lead Location: 753860
Implantable Lead Model: 4398
Implantable Lead Model: 5076
Implantable Pulse Generator Implant Date: 20170612
Lead Channel Impedance Value: 132.321
Lead Channel Impedance Value: 146.656
Lead Channel Impedance Value: 159.265
Lead Channel Impedance Value: 160.216
Lead Channel Impedance Value: 175.385
Lead Channel Impedance Value: 228 Ohm
Lead Channel Impedance Value: 247 Ohm
Lead Channel Impedance Value: 285 Ohm
Lead Channel Impedance Value: 304 Ohm
Lead Channel Impedance Value: 361 Ohm
Lead Channel Impedance Value: 418 Ohm
Lead Channel Impedance Value: 456 Ohm
Lead Channel Impedance Value: 456 Ohm
Lead Channel Impedance Value: 551 Ohm
Lead Channel Impedance Value: 551 Ohm
Lead Channel Impedance Value: 551 Ohm
Lead Channel Impedance Value: 589 Ohm
Lead Channel Impedance Value: 646 Ohm
Lead Channel Pacing Threshold Amplitude: 1 V
Lead Channel Pacing Threshold Amplitude: 1 V
Lead Channel Pacing Threshold Amplitude: 1.25 V
Lead Channel Pacing Threshold Pulse Width: 0.4 ms
Lead Channel Pacing Threshold Pulse Width: 0.4 ms
Lead Channel Pacing Threshold Pulse Width: 0.8 ms
Lead Channel Sensing Intrinsic Amplitude: 2.25 mV
Lead Channel Sensing Intrinsic Amplitude: 2.25 mV
Lead Channel Sensing Intrinsic Amplitude: 3.625 mV
Lead Channel Sensing Intrinsic Amplitude: 3.625 mV
Lead Channel Setting Pacing Amplitude: 1.75 V
Lead Channel Setting Pacing Amplitude: 2.25 V
Lead Channel Setting Pacing Amplitude: 2.5 V
Lead Channel Setting Pacing Pulse Width: 0.4 ms
Lead Channel Setting Pacing Pulse Width: 0.8 ms
Lead Channel Setting Sensing Sensitivity: 0.3 mV

## 2022-02-09 NOTE — Progress Notes (Signed)
Remote ICD transmission.   

## 2022-02-23 ENCOUNTER — Ambulatory Visit (INDEPENDENT_AMBULATORY_CARE_PROVIDER_SITE_OTHER): Payer: Medicare Other

## 2022-02-23 DIAGNOSIS — Z95810 Presence of automatic (implantable) cardiac defibrillator: Secondary | ICD-10-CM

## 2022-02-23 DIAGNOSIS — I5022 Chronic systolic (congestive) heart failure: Secondary | ICD-10-CM | POA: Diagnosis not present

## 2022-02-27 ENCOUNTER — Telehealth: Payer: Self-pay

## 2022-02-27 NOTE — Telephone Encounter (Signed)
Remote ICM transmission received.  Attempted call to patient regarding ICM remote transmission and left detailed message per DPR.  Advised to return call for any fluid symptoms or questions. Next ICM remote transmission scheduled 03/30/2022.

## 2022-02-27 NOTE — Progress Notes (Signed)
EPIC Encounter for ICM Monitoring  Patient Name: Lisa [name] is a 81 y.o. female Date: 02/27/2022 Primary Care Physican: [name], MD Primary Cardiologist: Harrington Challenger Electrophysiologist: Santina Evans Pacing: 98.2%         09/02/2021 Weight: 250 lbs 11/12/2021 Weight: 248 lbs 01/21/2022 Weight: 246 lbs   Time in AT/AF  0.0 hr/day (0.0%) Not on anticoagulation                                                            Attempted call to patient and unable to reach.  Left detailed message per DPR regarding transmission. Transmission reviewed.    Optivol thoracic impedance suggesting normal fluid levels.   Prescribed:  Torsemide Take 20 mg by mouth as directed. two days on one day off with potassium Potassium 10 mEq Take 10 mEq by mouth every other day. Per patient taking 2 tablets of 10 mEq to equal 20 mEq.   Labs: 12/26/2021 Creatinine 1.16, BUN 15, Potassium 4.1, Sodium 142, GFR 48 09/26/2021 Creatinine 1.29, BUN 21, Potassium 4.4, Sodium 141, GFR 42 08/28/2021 Creatinine 1.24, BUN 17, Potassium 3.9, Sodium 143, GFR 44 A complete set of results can be found in Results Review.   Recommendations:  Left voice mail with ICM number and encouraged to call if experiencing any fluid symptoms.   Follow-up plan: ICM clinic phone appointment on 03/30/2022.   91 day device clinic remote transmission 04/29/2022.     EP/Cardiology Office Visits: 04/29/2022 with Dr. Harrington Challenger.  Recall 03/21/2022 with Dr Lovena Le.   Copy of ICM check sent to Dr. Lovena Le.   3 month ICM trend: 02/23/2022.    12-14 Month ICM trend:     [name], RN 02/27/2022 8:52 AM

## 2022-03-03 ENCOUNTER — Telehealth: Payer: Self-pay

## 2022-03-03 NOTE — Telephone Encounter (Signed)
The patient called stating that her license was suspended because the dmv never received paperwork from [place] in 03/02/2017 that she can drive. She need someone to give her a call back to speak about this. I told her Dr Lovena Le nurse was not in this office today and I will send her a phone note to let the nurse know to give the patient a call back. Ms. Cara phone number is [phone].

## 2022-03-04 NOTE — Telephone Encounter (Signed)
Refaxed letter to the [facility].  Called patient to inform her and she states that since it has been over 5 years that they are needed her to fill out some additional paperwork. They are mailing her the packet and she is going to drop off any paperwork that needs to be filled out by Dr. Lovena Le at our office.

## 2022-03-30 ENCOUNTER — Telehealth: Payer: Self-pay

## 2022-03-30 ENCOUNTER — Ambulatory Visit (INDEPENDENT_AMBULATORY_CARE_PROVIDER_SITE_OTHER): Payer: Medicare Other

## 2022-03-30 DIAGNOSIS — I5022 Chronic systolic (congestive) heart failure: Secondary | ICD-10-CM | POA: Diagnosis not present

## 2022-03-30 DIAGNOSIS — Z95810 Presence of automatic (implantable) cardiac defibrillator: Secondary | ICD-10-CM

## 2022-03-30 NOTE — Telephone Encounter (Signed)
Remote ICM transmission received.  Attempted call to patient regarding ICM remote transmission and left detailed message per DPR to return call.  Advised to return call for any fluid symptoms or questions.      

## 2022-03-30 NOTE — Progress Notes (Signed)
EPIC Encounter for ICM Monitoring  Patient Name: Lisa [name] is a 81 y.o. female Date: 03/30/2022 Primary Care Physican: [name], MD Primary Cardiologist: Harrington Challenger Electrophysiologist: Santina Evans Pacing: 98.2%         11/12/2021 Weight: 248 lbs 01/21/2022 Weight: 246 lbs   Time in AT/AF  0.0 hr/day (0.0%) Not on anticoagulation                                                            Attempted call to patient and unable to reach.  Left detailed message per DPR regarding transmission. Transmission reviewed.     Optivol thoracic impedance suggesting possible fluid accumulation starting 8/16.   Prescribed:  Torsemide Take 20 mg by mouth as directed. two days on one day off with potassium Potassium 10 mEq Take 10 mEq by mouth every other day. Per patient taking 2 tablets of 10 mEq to equal 20 mEq.   Labs: 03/27/2022 Creatinine 1.41, BUN 19, Potassium 3.5, Sodium 139, GFR 38 12/26/2021 Creatinine 1.16, BUN 15, Potassium 4.1, Sodium 142, GFR 48 09/26/2021 Creatinine 1.29, BUN 21, Potassium 4.4, Sodium 141, GFR 42 08/28/2021 Creatinine 1.24, BUN 17, Potassium 3.9, Sodium 143, GFR 44 A complete set of results can be found in Results Review.   Recommendations:  Left voice mail with ICM number and encouraged to call if experiencing any fluid symptoms.   Follow-up plan: ICM clinic phone appointment on 04/14/2022 to recheck fluid levels.   91 day device clinic remote transmission 04/29/2022.     EP/Cardiology Office Visits: 04/29/2022 with Dr. Harrington Challenger.  Recall 03/21/2022 with Dr Lovena Le.   Copy of ICM check sent to Dr. Lovena Le. Will end copy to Dr Harrington Challenger for review if patient is reached.   3 month ICM trend: 03/30/2022.    12-14 Month ICM trend:     [name], RN 03/30/2022 10:31 AM

## 2022-04-11 ENCOUNTER — Encounter: Payer: Self-pay | Admitting: Internal Medicine

## 2022-04-14 ENCOUNTER — Ambulatory Visit (INDEPENDENT_AMBULATORY_CARE_PROVIDER_SITE_OTHER): Payer: Medicare Other

## 2022-04-14 DIAGNOSIS — Z95810 Presence of automatic (implantable) cardiac defibrillator: Secondary | ICD-10-CM

## 2022-04-14 DIAGNOSIS — I5022 Chronic systolic (congestive) heart failure: Secondary | ICD-10-CM

## 2022-04-14 NOTE — Telephone Encounter (Signed)
Lisa [name] is good.    Also I have heard good things about Lisa [name] and Lisa [name]

## 2022-04-15 NOTE — Progress Notes (Signed)
EPIC Encounter for ICM Monitoring  Patient Name: Lisa [name] is a 81 y.o. female Date: 04/15/2022 Primary Care Physican: Lisa Neat, MD Primary Cardiologist: Lisa [name] Electrophysiologist: Lisa [name] Pacing: 98.2%         11/12/2021 Weight: 248 lbs 01/21/2022 Weight: 246 lbs   Time in AT/AF  0.0 hr/day (0.0%) Not on anticoagulation                                                            Transmission reviewed.     Optivol thoracic impedance suggesting fluid levels returned to normal.   Prescribed:  Torsemide Take 20 mg by mouth as directed. two days on one day off with potassium Potassium 10 mEq Take 10 mEq by mouth every other day. Per patient taking 2 tablets of 10 mEq to equal 20 mEq.   Labs: 03/27/2022 Creatinine 1.41, BUN 19, Potassium 3.5, Sodium 139, GFR 38 12/26/2021 Creatinine 1.16, BUN 15, Potassium 4.1, Sodium 142, GFR 48 09/26/2021 Creatinine 1.29, BUN 21, Potassium 4.4, Sodium 141, GFR 42 08/28/2021 Creatinine 1.24, BUN 17, Potassium 3.9, Sodium 143, GFR 44 A complete set of results can be found in Results Review.   Recommendations:  No changes.   Follow-up plan: ICM clinic phone appointment on 05/04/2022.   91 day device clinic remote transmission 04/29/2022.     EP/Cardiology Office Visits: 04/29/2022 with Dr. Harrington [name].  Recall 03/21/2022 with Dr Lisa [name].   Copy of ICM check sent to Dr. Lovena [name]   3 month ICM trend: 04/14/2022.    12-14 Month ICM trend:     Lisa Billings, RN 04/15/2022 12:59 PM

## 2022-04-28 NOTE — Progress Notes (Unsigned)
Cardiology Office Note   Date:  04/29/2022   ID:  Lisa [name], Lisa [name] [birth year], MRN [id number]  PCP:  [name], MD  Cardiologist:   [name], MD   F/U of CHF      History of Present Illness:F Lisa [name] is a 81 y.o. 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  Cardiac catheterization was done.  This showed a 65% ramus narrowing.  LVEDP was moderately elevated at 23.[name]  LVEF was 50 to 55%.  She was seen by EP.  Placed on amiodarone.  I saw the pt in clinic in March 2023  Pt has been a little more SOB   Fluid was up by optivol in Aug   Addressed   Numbers returned to normal on 9/5  Breating is about the same now   Still gets SOB with activity  Says it is about the same. Current Meds  Medication Sig   acetaminophen (TYLENOL) 500 MG tablet Take 500-1,000 mg by mouth every 6 (six) hours as needed for moderate pain.   allopurinol (ZYLOPRIM) 100 MG tablet Take 100 mg by mouth daily.    amiodarone (PACERONE) 200 MG tablet Take 1 tablet (200 mg total) by mouth daily.   aspirin EC 81 MG tablet Take 81 mg by mouth at bedtime.    atorvastatin (LIPITOR) 20 MG tablet Take 1 tablet (20 mg total) by mouth daily.   b complex vitamins capsule Take 1 capsule by mouth daily.   calcium carbonate (OS-CAL - DOSED IN MG OF ELEMENTAL CALCIUM) 1250 (500 Ca) MG tablet Take 1 tablet by mouth.   carvedilol (COREG) 25 MG tablet Take 1 tablet (25 mg total) by mouth 2 (two) times daily.   Cholecalciferol (VITAMIN D-3 PO) Take 5,000 Units by mouth daily.   ferrous sulfate 325 (65  FE) MG EC tablet Take 1 tablet by mouth 3 (three) times a week.   imatinib (GLEEVEC) 400 MG tablet Take 400 mg by mouth daily with supper.    Inulin (METAMUCIL CLEAR & NATURAL PO) Take by mouth See admin instructions. Mix 1 teaspoonful into 8 ounces of water and drink as needed   Loperamide HCl 1 MG/7.5ML LIQD Take by mouth as needed.   Magnesium Oxide 400 MG CAPS Take 1 capsule (400 mg total) by mouth 2 (two) times daily.   Multiple Vitamin (MULTIVITAMIN WITH MINERALS) TABS tablet Take 1 tablet by mouth daily.   nitroGLYCERIN (NITROSTAT) 0.4 MG SL tablet Place 1 tablet (0.4 mg total) under the tongue every 5 (five) minutes x 3 doses as needed for chest pain.   potassium chloride (KLOR-CON) 10 MEQ tablet Take 10 mEq by mouth as directed. Take with Torsemide   thyroid (ARMOUR) 15 MG tablet Take 15 mg by mouth daily before breakfast.    torsemide (DEMADEX) 20 MG tablet Take 1 tablet (20 mg total) by mouth as directed.   triamcinolone ointment (KENALOG) 0.1 % Apply a small amount to the affected area bid   vitamin B-12 (CYANOCOBALAMIN) 500 MCG tablet  Take 500 mcg by mouth daily.   vitamin C (ASCORBIC ACID) 500 MG tablet Take 500 mg by mouth daily.     Allergies:   Flagyl [metronidazole], Penicillins, Sulfamethoxazole, Meperidine, Morphine and related, Propoxyphene, Tape, Doxycycline, Lanolin, and Sulfa antibiotics   Past Medical History:  Diagnosis Date   CAD in native artery 2017   stent to LAD   Cardiac arrest ([place]) 09/2015   CHF (congestive heart failure) (HCC)    CML (chronic myelocytic leukemia) (HCC)    Complication of anesthesia    Hx: UTI (urinary tract infection)    Hypertension    Melanoma (HCC)    PONV (postoperative nausea and vomiting)    PVC (premature ventricular contraction)    HISTORY OF   Thyroid disease    V tach ([place])     Past Surgical History:  Procedure Laterality Date   ABDOMINAL HYSTERECTOMY     APPENDECTOMY     CARDIAC CATHETERIZATION N/A 10/03/2015    Procedure: Left Heart Cath and Coronary Angiography;  Surgeon: [name], MD;  Location: [facility];  Service: Cardiovascular;  Laterality: N/A;   CARDIAC CATHETERIZATION N/A 10/07/2015   Procedure: Coronary Stent Intervention;  Surgeon: [name], MD;  Location: [facility];  Service: Cardiovascular;  Laterality: N/A;   CARDIAC CATHETERIZATION  09/22/2019   EP IMPLANTABLE DEVICE N/A 01/20/2016   Procedure: BiV ICD Insertion CRT-D;  Surgeon: [name], MD;  Location: [facility];  Service: Cardiovascular;  Laterality: N/A;   LEFT HEART CATH AND CORONARY ANGIOGRAPHY N/A 09/22/2019   Procedure: LEFT HEART CATH AND CORONARY ANGIOGRAPHY;  Surgeon: Martinique, Peter M, MD;  Location: [facility];  Service: Cardiovascular;  Laterality: N/A;   TONSILLECTOMY     TONSILLECTOMY     TUBAL LIGATION       Social History:  The patient  reports that she has never smoked. She has never used smokeless tobacco. She reports that she does not drink alcohol and does not use drugs.   Family History:  The patient's family history includes Coronary artery disease in her mother; Heart attack in her maternal grandfather and paternal grandfather; Multiple myeloma in her father; Thyroid disease in her mother.    ROS:  Please see the history of present illness. All other systems are reviewed and  Negative to the above problem except as noted.    PHYSICAL EXAM: VS:  BP (!) 120/40 (BP Location: Left Arm, Patient Position: Sitting, Cuff Size: Normal)   Pulse 60   Ht '5\' 2"'  (1.575 m)   Wt 240 lb (108.9 kg)   LMP 02/14/1995   SpO2 98%   BMI 43.90 kg/m   GEN: Morbidly obese 81 yo in no acute distress   Examined in chair HEENT: normal  Neck: Neck full  No obvious JVD   \Cardiac: RRR; no murmurs   Lungs Clear to auscultation Abdomen   Obese  Nontender  Leg:   Chronic lymphedema of legs  Skin: warm and dry  EKG:  EKG is not done today    Echo  Nov 2022  There is  turbulence in distal outflow tract which is contributing to murmur (no significant gradiet). Left ventricular ejection fraction, by estimation, is 45 to 50%. The left ventricle has mildly decreased function. The left ventricle demonstrates global hypokinesis. The left ventricular internal cavity size was mildly to moderately dilated. There is mild left ventricular hypertrophy of the posterior segment. Left ventricular diastolic parameters are  consistent with Grade II diastolic dysfunction (pseudonormalization). 1. Right ventricular systolic function is normal. The right ventricular size is normal. There is normal pulmonary artery systolic pressure. 2. 3. Left atrial size was severely dilated. 4. Right atrial size was mildly dilated. The mitral valve is normal in structure. Moderate mitral valve regurgitation. No evidence of mitral stenosis. 5. The aortic valve is tricuspid. There is moderate calcification of the aortic valve. There is mild thickening of the aortic valve. Aortic valve regurgitation is not visualized. Mild to moderate aortic valve sclerosis/calcification is present, without any evidence of aortic stenosis. 6. The inferior vena cava is dilated in size with >50% respiratory variability, suggesting right atrial pressure of 8 mmHg.  Echo   9.22.2Lipid Panel    Component Value Date/Time   CHOL 92 (L) 10/06/2019 1059   TRIG 92 10/06/2019 1059   HDL 39 (L) 10/06/2019 1059   CHOLHDL 2.4 10/06/2019 1059   CHOLHDL 4.2 10/04/2015 0329   VLDL 18 10/04/2015 0329   LDLCALC 35 10/06/2019 1059      Wt Readings from Last 3 Encounters:  04/29/22 240 lb (108.9 kg)  10/28/21 242 lb (109.8 kg)  08/28/21 250 lb (113.4 kg)      ASSESSMENT AND PLAN:  1  Hx chronic systo;occ CHF   Pt now with BiV ICD     Echo n Nov 2022 showed mild LV dysfunction     Keep on same regimen   Follow Optivol   2   CAD PT had cath in February 2021 after a VT event.  No obstructive CAD noted.Patent  stent Pt denies CP  Breathing stable   3  Hx VF arrest   S/p Medtronic CRT-D.  Continue amiodarone.  Follow with EP  4 Transient PAF short-lived she is not on anticoagulation. Continue to check device   5  HTN  BP is controlled   6   Lipids  LDL 35  HDL 39  Trig 92  Cont meds     7.  Thyroid  TSH was way off on recent labs   Will check full panel    Uses armour thyroid   Can have variability    Also on amio   8  CML.  Continues to follow with oncology  Continue on [place]  Current medicines are reviewed at length with the patient today.  The patient does not have concerns regarding medicines.  Signed, [name], MD  04/29/2022 7:50 PM    [address], [place], [place]  [address] Phone: [phone]; Fax: [phone]

## 2022-04-29 ENCOUNTER — Ambulatory Visit (INDEPENDENT_AMBULATORY_CARE_PROVIDER_SITE_OTHER): Payer: Medicare Other

## 2022-04-29 ENCOUNTER — Ambulatory Visit: Payer: Medicare Other | Attending: Internal Medicine | Admitting: Internal Medicine

## 2022-04-29 ENCOUNTER — Encounter: Payer: Self-pay | Admitting: Internal Medicine

## 2022-04-29 VITALS — BP 120/40 | HR 60 | Ht 62.0 in | Wt 240.0 lb

## 2022-04-29 DIAGNOSIS — I2510 Atherosclerotic heart disease of native coronary artery without angina pectoris: Secondary | ICD-10-CM | POA: Diagnosis present

## 2022-04-29 DIAGNOSIS — I255 Ischemic cardiomyopathy: Secondary | ICD-10-CM | POA: Diagnosis present

## 2022-04-29 DIAGNOSIS — I10 Essential (primary) hypertension: Secondary | ICD-10-CM | POA: Diagnosis present

## 2022-04-29 DIAGNOSIS — Z79899 Other long term (current) drug therapy: Secondary | ICD-10-CM | POA: Diagnosis present

## 2022-04-29 DIAGNOSIS — I493 Ventricular premature depolarization: Secondary | ICD-10-CM

## 2022-04-29 LAB — CUP PACEART REMOTE DEVICE CHECK
Battery Remaining Longevity: 24 mo
Battery Voltage: 2.93 V
Brady Statistic AP VP Percent: 0.13 %
Brady Statistic AP VS Percent: 0.02 %
Brady Statistic AS VP Percent: 98.21 %
Brady Statistic AS VS Percent: 1.64 %
Brady Statistic RA Percent Paced: 0.15 %
Brady Statistic RV Percent Paced: 0.34 %
Date Time Interrogation Session: 20230920043824
HighPow Impedance: 41 Ohm
Implantable Lead Implant Date: 20170612
Implantable Lead Implant Date: 20170612
Implantable Lead Implant Date: 20170612
Implantable Lead Location: 753858
Implantable Lead Location: 753859
Implantable Lead Location: 753860
Implantable Lead Model: 4398
Implantable Lead Model: 5076
Implantable Pulse Generator Implant Date: 20170612
Lead Channel Impedance Value: 123.5 Ohm
Lead Channel Impedance Value: 155.257
Lead Channel Impedance Value: 155.257
Lead Channel Impedance Value: 166.725
Lead Channel Impedance Value: 166.725
Lead Channel Impedance Value: 247 Ohm
Lead Channel Impedance Value: 247 Ohm
Lead Channel Impedance Value: 247 Ohm
Lead Channel Impedance Value: 304 Ohm
Lead Channel Impedance Value: 399 Ohm
Lead Channel Impedance Value: 418 Ohm
Lead Channel Impedance Value: 475 Ohm
Lead Channel Impedance Value: 513 Ohm
Lead Channel Impedance Value: 589 Ohm
Lead Channel Impedance Value: 608 Ohm
Lead Channel Impedance Value: 608 Ohm
Lead Channel Impedance Value: 665 Ohm
Lead Channel Impedance Value: 703 Ohm
Lead Channel Pacing Threshold Amplitude: 0.875 V
Lead Channel Pacing Threshold Amplitude: 1.25 V
Lead Channel Pacing Threshold Amplitude: 1.625 V
Lead Channel Pacing Threshold Pulse Width: 0.4 ms
Lead Channel Pacing Threshold Pulse Width: 0.4 ms
Lead Channel Pacing Threshold Pulse Width: 0.8 ms
Lead Channel Sensing Intrinsic Amplitude: 2.875 mV
Lead Channel Sensing Intrinsic Amplitude: 2.875 mV
Lead Channel Sensing Intrinsic Amplitude: 3.25 mV
Lead Channel Sensing Intrinsic Amplitude: 3.25 mV
Lead Channel Setting Pacing Amplitude: 2 V
Lead Channel Setting Pacing Amplitude: 2.25 V
Lead Channel Setting Pacing Amplitude: 2.5 V
Lead Channel Setting Pacing Pulse Width: 0.4 ms
Lead Channel Setting Pacing Pulse Width: 0.8 ms
Lead Channel Setting Sensing Sensitivity: 0.3 mV

## 2022-04-29 NOTE — Patient Instructions (Signed)
Medication Instructions:   *If you need a refill on your cardiac medications before your next appointment, please call your pharmacy*   Lab Work: Free T4 ,Free T3, TSH   If you have labs (blood work) drawn today and your tests are completely normal, you will receive your results only by: [facility] (if you have MyChart) OR A paper copy in the mail If you have any lab test that is abnormal or we need to change your treatment, we will call you to review the results.   Testing/Procedures:    Follow-Up: At [facility], you and your health needs are our priority.  As part of our continuing mission to provide you with exceptional heart care, we have created designated Provider Care Teams.  These Care Teams include your primary Cardiologist (physician) and Advanced Practice Providers (APPs -  Physician Assistants and Nurse Practitioners) who all work together to provide you with the care you need, when you need it.  We recommend signing up for the patient portal called "MyChart".  Sign up information is provided on this After Visit Summary.  MyChart is used to connect with patients for Virtual Visits (Telemedicine).  Patients are able to view lab/test results, encounter notes, upcoming appointments, etc.  Non-urgent messages can be sent to your provider as well.   To learn more about what you can do with MyChart, go to [email or web address].    Your next appointment:   8 month(s)  The format for your next appointment:   In Person  Provider:   [name], MD     Other Instructions   Important Information About [place]

## 2022-04-30 LAB — T3, FREE: T3, Free: 2.6 pg/mL (ref 2.0–4.4)

## 2022-04-30 LAB — T4, FREE: Free T4: 1.86 ng/dL — ABNORMAL HIGH (ref 0.82–1.77)

## 2022-04-30 LAB — TSH: TSH: 0.584 u[IU]/mL (ref 0.450–4.500)

## 2022-05-04 ENCOUNTER — Ambulatory Visit (INDEPENDENT_AMBULATORY_CARE_PROVIDER_SITE_OTHER): Payer: Medicare Other

## 2022-05-04 DIAGNOSIS — Z95810 Presence of automatic (implantable) cardiac defibrillator: Secondary | ICD-10-CM | POA: Diagnosis not present

## 2022-05-04 DIAGNOSIS — I5022 Chronic systolic (congestive) heart failure: Secondary | ICD-10-CM | POA: Diagnosis not present

## 2022-05-07 NOTE — Progress Notes (Signed)
EPIC Encounter for ICM Monitoring  Patient Name: Lisa [name] is a 81 y.o. female Date: 05/07/2022 Primary Care Physican: [name], MD Primary Cardiologist: Harrington Challenger Electrophysiologist: Santina Evans Pacing: 98.2%         11/12/2021 Weight: 248 lbs 01/21/2022 Weight: 246 lbs   Time in AT/AF  0.0 hr/day (0.0%) Not on anticoagulation                                                            Spoke with patient and heart failure questions reviewed.  Transmission results reviewed.  Pt asymptomatic for fluid accumulation.  Reports feeling well at this time and voices no complaints.     Optivol thoracic impedance suggesting possible fluid accumulation.   Prescribed:  Torsemide Take 20 mg by mouth as directed. two days on one day off with potassium Potassium 10 mEq Take 10 mEq by mouth every other day. Per patient taking 2 tablets of 10 mEq to equal 20 mEq.   Labs: 03/27/2022 Creatinine 1.41, BUN 19, Potassium 3.5, Sodium 139, GFR 38 12/26/2021 Creatinine 1.16, BUN 15, Potassium 4.1, Sodium 142, GFR 48 09/26/2021 Creatinine 1.29, BUN 21, Potassium 4.4, Sodium 141, GFR 42 08/28/2021 Creatinine 1.24, BUN 17, Potassium 3.9, Sodium 143, GFR 44 A complete set of results can be found in Results Review.   Recommendations:  No changes and encouraged to call if experiencing any fluid symptoms.   Follow-up plan: ICM clinic phone appointment on 06/08/2022.   91 day device clinic remote transmission 07/29/2022.     EP/Cardiology Office Visits:  Recall 12/25/2022 with Dr. Harrington Challenger.  Recall 08/29/2022 with Dr Lovena Le.   Copy of ICM check sent to Dr. Lovena Le.   3 month ICM trend: 05/04/2022.    12-14 Month ICM trend:     [name], RN 05/07/2022 1:23 PM

## 2022-05-12 NOTE — Progress Notes (Signed)
Remote ICD transmission.   

## 2022-05-18 ENCOUNTER — Telehealth: Payer: Self-pay

## 2022-05-18 NOTE — Telephone Encounter (Signed)
Device alert for VF with successful therapy. Events occurred 10/8 @ 01:00, EGM shows polymorphic VT, HR 250, ATP delivered x1 unsuccessful, HV therapy 35J converting to regular AS/VP, event duration 19 sec. Route to triage high priority.  Patient reports during this time she was awake during the night, had episode of brief dizziness that lasted approx. 4 seconds. States she was unaware of shock. Patient reports missing 3 days of coreg due to losing the medication. States she now has it and will resume as prescribed to BID. Patient made aware of no driving x6 months per [place] DMV and shock plan. Advised I will forward to Dr. Lovena Le for review. Voiced understanding.

## 2022-05-21 ENCOUNTER — Encounter (HOSPITAL_COMMUNITY): Payer: Self-pay

## 2022-05-21 ENCOUNTER — Other Ambulatory Visit: Payer: Self-pay

## 2022-05-21 ENCOUNTER — Inpatient Hospital Stay (HOSPITAL_COMMUNITY)
Admission: EM | Admit: 2022-05-21 | Discharge: 2022-05-28 | DRG: 871 | Disposition: A | Discharge status: Skilled Nursing Facility | Payer: Medicare Other | Attending: Family Medicine | Admitting: Family Medicine

## 2022-05-21 ENCOUNTER — Emergency Department (HOSPITAL_COMMUNITY): Payer: Medicare Other

## 2022-05-21 DIAGNOSIS — E039 Hypothyroidism, unspecified: Secondary | ICD-10-CM | POA: Diagnosis present

## 2022-05-21 DIAGNOSIS — Z91048 Other nonmedicinal substance allergy status: Secondary | ICD-10-CM

## 2022-05-21 DIAGNOSIS — I428 Other cardiomyopathies: Secondary | ICD-10-CM | POA: Diagnosis present

## 2022-05-21 DIAGNOSIS — I130 Hypertensive heart and chronic kidney disease with heart failure and stage 1 through stage 4 chronic kidney disease, or unspecified chronic kidney disease: Secondary | ICD-10-CM | POA: Diagnosis present

## 2022-05-21 DIAGNOSIS — Z9221 Personal history of antineoplastic chemotherapy: Secondary | ICD-10-CM

## 2022-05-21 DIAGNOSIS — E785 Hyperlipidemia, unspecified: Secondary | ICD-10-CM | POA: Diagnosis present

## 2022-05-21 DIAGNOSIS — I890 Lymphedema, not elsewhere classified: Secondary | ICD-10-CM

## 2022-05-21 DIAGNOSIS — N1832 Chronic kidney disease, stage 3b: Secondary | ICD-10-CM | POA: Diagnosis present

## 2022-05-21 DIAGNOSIS — A419 Sepsis, unspecified organism: Secondary | ICD-10-CM | POA: Diagnosis not present

## 2022-05-21 DIAGNOSIS — Z882 Allergy status to sulfonamides status: Secondary | ICD-10-CM

## 2022-05-21 DIAGNOSIS — I4901 Ventricular fibrillation: Secondary | ICD-10-CM | POA: Diagnosis present

## 2022-05-21 DIAGNOSIS — Z90710 Acquired absence of both cervix and uterus: Secondary | ICD-10-CM

## 2022-05-21 DIAGNOSIS — C9210 Chronic myeloid leukemia, BCR/ABL-positive, not having achieved remission: Secondary | ICD-10-CM

## 2022-05-21 DIAGNOSIS — I447 Left bundle-branch block, unspecified: Secondary | ICD-10-CM | POA: Diagnosis present

## 2022-05-21 DIAGNOSIS — Z85820 Personal history of malignant melanoma of skin: Secondary | ICD-10-CM

## 2022-05-21 DIAGNOSIS — Z8349 Family history of other endocrine, nutritional and metabolic diseases: Secondary | ICD-10-CM

## 2022-05-21 DIAGNOSIS — I5023 Acute on chronic systolic (congestive) heart failure: Secondary | ICD-10-CM

## 2022-05-21 DIAGNOSIS — Z79890 Hormone replacement therapy (postmenopausal): Secondary | ICD-10-CM

## 2022-05-21 DIAGNOSIS — Z8674 Personal history of sudden cardiac arrest: Secondary | ICD-10-CM

## 2022-05-21 DIAGNOSIS — Z91148 Patient's other noncompliance with medication regimen for other reason: Secondary | ICD-10-CM

## 2022-05-21 DIAGNOSIS — I5022 Chronic systolic (congestive) heart failure: Secondary | ICD-10-CM | POA: Diagnosis not present

## 2022-05-21 DIAGNOSIS — I472 Ventricular tachycardia, unspecified: Secondary | ICD-10-CM

## 2022-05-21 DIAGNOSIS — Z7982 Long term (current) use of aspirin: Secondary | ICD-10-CM

## 2022-05-21 DIAGNOSIS — J189 Pneumonia, unspecified organism: Secondary | ICD-10-CM

## 2022-05-21 DIAGNOSIS — Z807 Family history of other malignant neoplasms of lymphoid, hematopoietic and related tissues: Secondary | ICD-10-CM

## 2022-05-21 DIAGNOSIS — Z4502 Encounter for adjustment and management of automatic implantable cardiac defibrillator: Principal | ICD-10-CM

## 2022-05-21 DIAGNOSIS — Z888 Allergy status to other drugs, medicaments and biological substances status: Secondary | ICD-10-CM

## 2022-05-21 DIAGNOSIS — Z955 Presence of coronary angioplasty implant and graft: Secondary | ICD-10-CM

## 2022-05-21 DIAGNOSIS — Z880 Allergy status to penicillin: Secondary | ICD-10-CM

## 2022-05-21 DIAGNOSIS — R6520 Severe sepsis without septic shock: Secondary | ICD-10-CM | POA: Diagnosis present

## 2022-05-21 DIAGNOSIS — I10 Essential (primary) hypertension: Secondary | ICD-10-CM | POA: Diagnosis present

## 2022-05-21 DIAGNOSIS — Z881 Allergy status to other antibiotic agents status: Secondary | ICD-10-CM

## 2022-05-21 DIAGNOSIS — I2510 Atherosclerotic heart disease of native coronary artery without angina pectoris: Secondary | ICD-10-CM | POA: Diagnosis present

## 2022-05-21 DIAGNOSIS — Z79899 Other long term (current) drug therapy: Secondary | ICD-10-CM

## 2022-05-21 DIAGNOSIS — J9601 Acute respiratory failure with hypoxia: Secondary | ICD-10-CM | POA: Diagnosis present

## 2022-05-21 DIAGNOSIS — Z8249 Family history of ischemic heart disease and other diseases of the circulatory system: Secondary | ICD-10-CM

## 2022-05-21 DIAGNOSIS — E876 Hypokalemia: Secondary | ICD-10-CM | POA: Diagnosis present

## 2022-05-21 DIAGNOSIS — Z885 Allergy status to narcotic agent status: Secondary | ICD-10-CM

## 2022-05-21 DIAGNOSIS — Z95810 Presence of automatic (implantable) cardiac defibrillator: Secondary | ICD-10-CM

## 2022-05-21 DIAGNOSIS — T501X6A Underdosing of loop [high-ceiling] diuretics, initial encounter: Secondary | ICD-10-CM | POA: Diagnosis present

## 2022-05-21 DIAGNOSIS — Z6841 Body Mass Index (BMI) 40.0 and over, adult: Secondary | ICD-10-CM

## 2022-05-21 DIAGNOSIS — I872 Venous insufficiency (chronic) (peripheral): Secondary | ICD-10-CM | POA: Diagnosis present

## 2022-05-21 DIAGNOSIS — N179 Acute kidney failure, unspecified: Secondary | ICD-10-CM | POA: Insufficient documentation

## 2022-05-21 DIAGNOSIS — D539 Nutritional anemia, unspecified: Secondary | ICD-10-CM | POA: Insufficient documentation

## 2022-05-21 DIAGNOSIS — I4729 Other ventricular tachycardia: Secondary | ICD-10-CM

## 2022-05-21 DIAGNOSIS — Z1152 Encounter for screening for COVID-19: Secondary | ICD-10-CM

## 2022-05-21 LAB — COMPREHENSIVE METABOLIC PANEL
ALT: 29 U/L (ref 0–44)
AST: 37 U/L (ref 15–41)
Albumin: 2.7 g/dL — ABNORMAL LOW (ref 3.5–5.0)
Alkaline Phosphatase: 66 U/L (ref 38–126)
Anion gap: 9 (ref 5–15)
BUN: 10 mg/dL (ref 8–23)
CO2: 23 mmol/L (ref 22–32)
Calcium: 7.9 mg/dL — ABNORMAL LOW (ref 8.9–10.3)
Chloride: 107 mmol/L (ref 98–111)
Creatinine, Ser: 1.11 mg/dL — ABNORMAL HIGH (ref 0.44–1.00)
GFR, Estimated: 50 mL/min — ABNORMAL LOW (ref >60)
Glucose, Bld: 138 mg/dL — ABNORMAL HIGH (ref 70–99)
Potassium: 3.1 mmol/L — ABNORMAL LOW (ref 3.5–5.1)
Sodium: 139 mmol/L (ref 135–145)
Total Bilirubin: 1.4 mg/dL — ABNORMAL HIGH (ref 0.3–1.2)
Total Protein: 5.9 g/dL — ABNORMAL LOW (ref 6.5–8.1)

## 2022-05-21 LAB — CBC WITH DIFFERENTIAL/PLATELET
Abs Immature Granulocytes: 0.49 10*3/uL — ABNORMAL HIGH (ref 0.00–0.07)
Basophils Absolute: 0 10*3/uL (ref 0.0–0.1)
Basophils Relative: 0 %
Eosinophils Absolute: 0 10*3/uL (ref 0.0–0.5)
Eosinophils Relative: 0 %
HCT: 27.3 % — ABNORMAL LOW (ref 36.0–46.0)
Hemoglobin: 8.8 g/dL — ABNORMAL LOW (ref 12.0–15.0)
Immature Granulocytes: 3 %
Lymphocytes Relative: 4 %
Lymphs Abs: 0.7 10*3/uL (ref 0.7–4.0)
MCH: 32.4 pg (ref 26.0–34.0)
MCHC: 32.2 g/dL (ref 30.0–36.0)
MCV: 100.4 fL — ABNORMAL HIGH (ref 80.0–100.0)
Monocytes Absolute: 0.6 10*3/uL (ref 0.1–1.0)
Monocytes Relative: 4 %
Neutro Abs: 15.6 10*3/uL — ABNORMAL HIGH (ref 1.7–7.7)
Neutrophils Relative %: 89 %
Platelets: 147 10*3/uL — ABNORMAL LOW (ref 150–400)
RBC: 2.72 MIL/uL — ABNORMAL LOW (ref 3.87–5.11)
RDW: 16.3 % — ABNORMAL HIGH (ref 11.5–15.5)
WBC: 17.4 10*3/uL — ABNORMAL HIGH (ref 4.0–10.5)
nRBC: 0 % (ref 0.0–0.2)

## 2022-05-21 LAB — LACTIC ACID, PLASMA
Lactic Acid, Venous: 2.1 mmol/L (ref 0.5–1.9)
Lactic Acid, Venous: 1.6 mmol/L (ref 0.5–1.9)

## 2022-05-21 LAB — TSH: TSH: 1.142 u[IU]/mL (ref 0.350–4.500)

## 2022-05-21 LAB — CBG MONITORING, ED: Glucose-Capillary: 123 mg/dL — ABNORMAL HIGH (ref 70–99)

## 2022-05-21 LAB — MAGNESIUM: Magnesium: 1.6 mg/dL — ABNORMAL LOW (ref 1.7–2.4)

## 2022-05-21 LAB — SARS CORONAVIRUS 2 BY RT PCR: SARS Coronavirus 2 by RT PCR: NEGATIVE

## 2022-05-21 MED ORDER — ALLOPURINOL 100 MG PO TABS
100.0000 mg | ORAL_TABLET | Freq: Every day | ORAL | Status: DC
  Administered 2022-05-22 – 2022-05-28 (×7): 100 mg via ORAL
  Filled 2022-05-21 (×7): qty 1

## 2022-05-21 MED ORDER — ENOXAPARIN SODIUM 40 MG/0.4ML IJ SOSY
40.0000 mg | PREFILLED_SYRINGE | INTRAMUSCULAR | Status: DC
  Administered 2022-05-21 – 2022-05-27 (×7): 40 mg via SUBCUTANEOUS
  Filled 2022-05-21 (×7): qty 0.4

## 2022-05-21 MED ORDER — GUAIFENESIN ER 600 MG PO TB12: 600.0000 mg | ORAL_TABLET | Freq: Two times a day (BID) | ORAL | Status: DC | PRN

## 2022-05-21 MED ORDER — ACETAMINOPHEN 650 MG RE SUPP: 650.0000 mg | Freq: Four times a day (QID) | RECTAL | Status: DC | PRN

## 2022-05-21 MED ORDER — AMIODARONE LOAD VIA INFUSION
150.0000 mg | Freq: Once | INTRAVENOUS | Status: AC
  Administered 2022-05-21: 150 mg via INTRAVENOUS
  Filled 2022-05-21: qty 83.34

## 2022-05-21 MED ORDER — ADULT MULTIVITAMIN W/MINERALS CH
1.0000 | ORAL_TABLET | Freq: Every day | ORAL | Status: DC
  Administered 2022-05-22 – 2022-05-28 (×7): 1 via ORAL
  Filled 2022-05-21 (×7): qty 1

## 2022-05-21 MED ORDER — MAGNESIUM SULFATE 4 GM/100ML IV SOLN
4.0000 g | Freq: Once | INTRAVENOUS | Status: AC
  Administered 2022-05-21: 4 g via INTRAVENOUS
  Filled 2022-05-21: qty 100

## 2022-05-21 MED ORDER — AZITHROMYCIN 500 MG PO TABS
500.0000 mg | ORAL_TABLET | Freq: Every day | ORAL | Status: AC
  Administered 2022-05-21 – 2022-05-23 (×3): 500 mg via ORAL
  Filled 2022-05-21: qty 1
  Filled 2022-05-21: qty 2
  Filled 2022-05-21: qty 1

## 2022-05-21 MED ORDER — ATORVASTATIN CALCIUM 10 MG PO TABS
20.0000 mg | ORAL_TABLET | Freq: Every day | ORAL | Status: DC
  Administered 2022-05-22 – 2022-05-28 (×7): 20 mg via ORAL
  Filled 2022-05-21 (×7): qty 2

## 2022-05-21 MED ORDER — FUROSEMIDE 10 MG/ML IJ SOLN
40.0000 mg | Freq: Once | INTRAMUSCULAR | Status: AC
  Administered 2022-05-21: 40 mg via INTRAVENOUS
  Filled 2022-05-21: qty 4

## 2022-05-21 MED ORDER — ACETAMINOPHEN 325 MG PO TABS
650.0000 mg | ORAL_TABLET | Freq: Four times a day (QID) | ORAL | Status: DC | PRN
  Administered 2022-05-26: 650 mg via ORAL
  Filled 2022-05-21: qty 2

## 2022-05-21 MED ORDER — AMIODARONE HCL IN DEXTROSE 360-4.14 MG/200ML-% IV SOLN
60.0000 mg/h | INTRAVENOUS | Status: DC
  Administered 2022-05-21: 60 mg/h via INTRAVENOUS
  Filled 2022-05-21 (×2): qty 200

## 2022-05-21 MED ORDER — ASPIRIN 81 MG PO TBEC
81.0000 mg | DELAYED_RELEASE_TABLET | Freq: Every day | ORAL | Status: DC
  Administered 2022-05-21 – 2022-05-27 (×7): 81 mg via ORAL
  Filled 2022-05-21 (×7): qty 1

## 2022-05-21 MED ORDER — SODIUM CHLORIDE 0.9 % IV SOLN
1.0000 g | Freq: Once | INTRAVENOUS | Status: AC
  Administered 2022-05-21: 1 g via INTRAVENOUS
  Filled 2022-05-21: qty 10

## 2022-05-21 MED ORDER — POTASSIUM CHLORIDE 10 MEQ/100ML IV SOLN
10.0000 meq | INTRAVENOUS | Status: AC
  Administered 2022-05-21 (×3): 10 meq via INTRAVENOUS
  Filled 2022-05-21 (×3): qty 100

## 2022-05-21 MED ORDER — FUROSEMIDE 10 MG/ML IJ SOLN
80.0000 mg | Freq: Once | INTRAMUSCULAR | Status: AC
  Administered 2022-05-21: 80 mg via INTRAVENOUS
  Filled 2022-05-21: qty 8

## 2022-05-21 MED ORDER — MEXILETINE HCL 250 MG PO CAPS
250.0000 mg | ORAL_CAPSULE | Freq: Two times a day (BID) | ORAL | Status: DC
  Administered 2022-05-21 – 2022-05-28 (×14): 250 mg via ORAL
  Filled 2022-05-21 (×17): qty 1

## 2022-05-21 MED ORDER — ACETAMINOPHEN 500 MG PO TABS
1000.0000 mg | ORAL_TABLET | Freq: Once | ORAL | Status: AC
  Administered 2022-05-21: 1000 mg via ORAL
  Filled 2022-05-21: qty 2

## 2022-05-21 MED ORDER — POTASSIUM CHLORIDE CRYS ER 20 MEQ PO TBCR
40.0000 meq | EXTENDED_RELEASE_TABLET | Freq: Once | ORAL | Status: AC
  Administered 2022-05-21: 40 meq via ORAL
  Filled 2022-05-21: qty 2

## 2022-05-21 MED ORDER — AMIODARONE HCL IN DEXTROSE 360-4.14 MG/200ML-% IV SOLN
30.0000 mg/h | INTRAVENOUS | Status: DC
  Administered 2022-05-21: 60 mg/h via INTRAVENOUS
  Administered 2022-05-22 (×3): 30 mg/h via INTRAVENOUS
  Filled 2022-05-21 (×2): qty 200

## 2022-05-21 MED ORDER — THYROID 30 MG PO TABS
15.0000 mg | ORAL_TABLET | Freq: Every day | ORAL | Status: DC
  Administered 2022-05-22 – 2022-05-28 (×7): 15 mg via ORAL
  Filled 2022-05-21 (×8): qty 1

## 2022-05-21 MED ORDER — SODIUM CHLORIDE 0.9 % IV SOLN
1.0000 g | INTRAVENOUS | Status: DC
  Administered 2022-05-22: 1 g via INTRAVENOUS
  Filled 2022-05-21: qty 10

## 2022-05-21 MED ORDER — PROCHLORPERAZINE EDISYLATE 10 MG/2ML IJ SOLN
5.0000 mg | Freq: Once | INTRAMUSCULAR | Status: AC
  Administered 2022-05-21: 5 mg via INTRAVENOUS
  Filled 2022-05-21: qty 2

## 2022-05-21 MED ORDER — IMATINIB MESYLATE 100 MG PO TABS
400.0000 mg | ORAL_TABLET | Freq: Every day | ORAL | Status: DC
  Administered 2022-05-21 – 2022-05-27 (×7): 400 mg via ORAL
  Filled 2022-05-21 (×8): qty 4

## 2022-05-21 NOTE — H&P (Addendum)
Hospital Admission History and Physical Service Pager: [phone]  Patient name: Lisa [name] Medical record number: [id number] Date of Birth: 12-22-1940 Age: 81 y.o. Gender: female  Primary Care Provider: Patient, No Pcp Per Consultants: cardiology (EP) Code Status: FULL Preferred Emergency Contact: Larene [place], daughter Contact Information     Name Relation Home Work Mobile   Kemp,Shannon Daughter   [phone]   Burgin,Scott Son   [phone]       Chief Complaint: dizziness, SOB, cough  Assessment and Plan: DOLORA RIDGELY is a 81 y.o. female presenting with dizziness, SOB, and cough. Differential for this patient's presentation of this includes pneumonia (most likely given fever, SOB, tachypnea, CXR findings), worsening heart failure (less likely given overall stable LE edema without pitting, unremarkable CXR for pulmonary edema), ACS (less likely given overall unremarkable EKG), and stroke (less likely given stable appearance without apparent neurological deficits).  Sepsis due to pneumonia ([place]) Symptoms, exam, and imaging findings suggestive of pneumonia.  Meeting sepsis criteria.  Patient is currently stable on 0.25 L [place], though she remains tachypneic to RR 22. She is s/p 1g rocephin in ED. Given her comorbid conditions, will admit for treatment and further cardiac monitoring as below. -Admit to FMTS, progressive, attending Dr. Gwendlyn Deutscher -Continue IV rocephin 1 g daily, next dose tomorrow 10/13 (which will be day 2/5) -Add oral azithromycin 500 mg daily for 3 days -Mucinex for phlegm and congestion -F/u Bcx -Continuous O2 monitoring, vitals per shift. Currently on 0.25 L [place], wean as tolerated  Ventricular fibrillation (HCC) Appears to have went into VF multiple times over last couple of days with successful shocks. Suspect acute worsening of VF episodes d/t pneumonia. Cardiology consulted in ED and provided recommendations, appreciate assistance. She is overall  stable. -No ischemic workup per cards -Hold coreg for now given acute illness -Replete electrolytes prn -Continue amiodarone drip, mexiletine per cardiology -Continuous cardiac monitoring  Lymphedema Chronic, maybe slightly worsened. On torsemide at home though did not take over last couple of days because she did not want to urinate while out and about. Also has a painful bruise vs cellulitis overlying left lower knee, though chronicity for 2 years makes both of these atypical. -IV lasix per cardiology -Monitor pain and symptoms of LLE lesion  CML (chronic myelocytic leukemia) ([place]) Follows with [facility] for oral chemotherapy. -Continue home meds  Essential hypertension BP elevated on admission to 140-150s/40-60s. Takes coreg and torsemide at home. 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  Likely hypervolemic secondary to medication nonadherence. - s/p IV furosemide 40 mg - strict I/O, daily weights - diuresis per cardiology   FEN/GI: Heart healthy VTE Prophylaxis: Lovenox  Disposition: Progressive  History of Present Illness:  Lisa [name] is a 81 y.o. female presenting with general malaise.  Patient reports she has been feeling generally unwell since Monday, 3 days ago. She notes that her defibrillator went off once on Monday and several times yesterday. She started having chills since last night. Also had some nausea and vomiting since this morning. Reports associated SOB worse than baseline (has SOB with exertion at baseline). Denies chest pain. Unsure if she has had a fever at home. No sick contacts. Of note, she did not take her torsemide the past 4 days, and per ED physician, she did not take her coreg since she has been out  of it for the last 3 days.  In the ED, she was found to have pulmonary opacities on CXR. ICD was  interrogated and found to have multiple episodes of shocked VF.  Review Of Systems: Per HPI with the following additions:  Review of Systems  Constitutional:  Positive for chills.  HENT:  Positive for congestion. Negative for sore throat.        Postnasal drip  Respiratory:  Positive for cough (mild) and shortness of breath.   Cardiovascular:  Positive for leg swelling. Negative for chest pain.  Gastrointestinal:  Positive for constipation (fluctuates between constipation and diarrhea at baseline), nausea and vomiting. Negative for diarrhea.  Genitourinary:  Negative for dysuria and urgency.       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   Pertinent Past Surgical History: Appendectomy Hysterectomy ICD insertion 2017 Remainder reviewed in history tab.  Pertinent Social History: Tobacco use: Yes/No/Former Alcohol use: rare Other Substance use: denies Lives with self in [place]. Daughter lives in [place]  Pertinent Family History: Mother - atheroscloerosis Father - multiple myeloma Aunt - breast cancer No FMHx CVA Remainder reviewed in history tab.   Important Outpatient Medications: Amiodarone 100 mg ASA Carvedilol 12.5 mg BID Imatinib 400 mg Remainder reviewed in medication history.   Objective: BP (!) 149/46   Pulse 75   Temp (!) 100.5 F (38.1 C) (Axillary)   Resp (!) 22   Ht '5\' 2"'  (1.575 m)   Wt 111.1 kg   LMP 02/14/1995   SpO2 94%   BMI 44.81 kg/m  Exam: General: Alert and oriented, in NAD Eyes: EOM grossly intact, no scleral icterus ENTM: Nasal cannula in, neck ROM grossly intact, slightly dry MM Cardiovascular: RRR, 2/6 systolic murmur appreciated Respiratory: Expiratory wheezes appreciated in lower  left lung fields, crackles heard in right lower lung fields; breathing and speaking comfortably on 0.25 L [place] Gastrointestinal: Soft, nontender, nondistended, normoactive bowel sounds Ext: Marked lymphedema in bilateral lower extremities; large, hyperpigmented area on left lower leg around knee Neuro: No gross focal deficit Psych: Appropriate mood and affect  Labs:  CBC BMET  Recent Labs  Lab 05/21/22 1401  WBC 17.4*  HGB 8.8*  HCT 27.3*  PLT 147*   Recent Labs  Lab 05/21/22 1401  NA 139  K 3.1*  CL 107  CO2 23  BUN 10  CREATININE 1.11*  GLUCOSE 138*  CALCIUM 7.9*     Mg 1.6 LA 2.1  EKG: NSR with ICD placement, no STEMI/BBB  Imaging Studies Performed:  CXR IMPRESSION: New left pleural effusion and increased left greater than right basilar pulmonary opacities which may reflect atelectasis or pneumonia. Chronic vascular congestion.  Jacelyn Grip, MD 05/21/2022, 6:45 PM PGY-1, [place] Intern pager: [phone], text pages welcome Secure chat group [place]   I was personally present and performed or re-performed the history, physical exam and medical decision making activities of this service and have verified that the service and findings are accurately documented in the resident's note.  Zola Button, MD                  05/21/2022, 6:46 PM

## 2022-05-21 NOTE — ED Notes (Signed)
IV team attempted access x2, unsuccessful. Patient medications incompatible at this time.

## 2022-05-21 NOTE — ED Provider Notes (Signed)
[facility] EMERGENCY DEPARTMENT Provider Note   CSN: [id number] Arrival date & time: 05/21/22  1308     History  Chief Complaint  Patient presents with   Defib fired    Lisa [name] is a 81 y.o. female.  Pt is a 81 yo female with a pmhx significant for morbid obesity, HTN, HLD, hypothyroidism, LBBB, chronic lymphedema, CML, CHF (EF 25-30%), vfib arrest s/p ICD, and CAD with a stent to the LAD.  Pt said she was sitting in her chair a few days ago and felt very dizzy.  She said she was told her defibrillator went off.  She was told to come to the ED, but did not feel well, so she did not come in.  She has been more sob, but has not been taking her torsemide.  She woke up this am and was vomiting.  The pt has not been able to keep anything down.  She said she was given medicine (zofran) from EMS which has helped her.  She has had several episodes of dizziness since this event.    Per Epic notes on 10/9 from Cards:  Device alert for VF with successful therapy. Events occurred 10/8 @ 01:00, EGM shows polymorphic VT, HR 250, ATP delivered x1 unsuccessful, HV therapy 35J converting to regular AS/VP, event duration 19 sec. Route to triage high priority.   Patient reports during this time she was awake during the night, had episode of brief dizziness that lasted approx. 4 seconds. States she was unaware of shock. Patient reports missing 3 days of coreg due to losing the medication. States she now has it and will resume as prescribed to BID. Patient made aware of no driving x6 months per [place] DMV and shock plan. Advised I will forward to Dr. Lovena Le for review. Voiced understanding.            Home Medications Prior to Admission medications   Medication Sig Start Date End Date Taking? Authorizing Provider  acetaminophen (TYLENOL) 500 MG tablet Take 500-1,000 mg by mouth every 6 (six) hours as needed for moderate pain.    [provider]  allopurinol  (ZYLOPRIM) 100 MG tablet Take 100 mg by mouth daily.  06/15/17   [provider]  amiodarone (PACERONE) 200 MG tablet Take 1 tablet (200 mg total) by mouth daily. 08/06/21   [name], MD  aspirin EC 81 MG tablet Take 81 mg by mouth at bedtime.     [provider]  atorvastatin (LIPITOR) 20 MG tablet Take 1 tablet (20 mg total) by mouth daily. 05/07/16   Bensimhon, Shaune Pascal, MD  b complex vitamins capsule Take 1 capsule by mouth daily.    [provider]  calcium carbonate (OS-CAL - DOSED IN MG OF ELEMENTAL CALCIUM) 1250 (500 Ca) MG tablet Take 1 tablet by mouth.    [provider]  carvedilol (COREG) 25 MG tablet Take 1 tablet (25 mg total) by mouth 2 (two) times daily. 12/01/21   Fay Records, MD  Cholecalciferol (VITAMIN D-3 PO) Take 5,000 Units by mouth daily.    [provider]  ferrous sulfate 325 (65 FE) MG EC tablet Take 1 tablet by mouth 3 (three) times a week. 06/27/21   [provider]  imatinib (GLEEVEC) 400 MG tablet Take 400 mg by mouth daily with supper.  02/16/17   [provider]  Inulin (METAMUCIL CLEAR & NATURAL PO) Take by mouth See admin instructions. Mix 1 teaspoonful  into 8 ounces of water and drink as needed    [provider]  Loperamide HCl 1 MG/7.5ML LIQD Take by mouth as needed.    [provider]  Magnesium Oxide 400 MG CAPS Take 1 capsule (400 mg total) by mouth 2 (two) times daily. 10/18/19   Baldwin Jamaica, PA-C  Multiple Vitamin (MULTIVITAMIN WITH MINERALS) TABS tablet Take 1 tablet by mouth daily.    [provider]  nitroGLYCERIN (NITROSTAT) 0.4 MG SL tablet Place 1 tablet (0.4 mg total) under the tongue every 5 (five) minutes x 3 doses as needed for chest pain. 12/24/20   Fay Records, MD  potassium chloride (KLOR-CON) 10 MEQ tablet Take 10 mEq by mouth as directed. Take with Torsemide    [provider]  thyroid (ARMOUR) 15 MG tablet Take 15 mg by mouth daily  before breakfast.     [provider]  torsemide (DEMADEX) 20 MG tablet Take 1 tablet (20 mg total) by mouth as directed. 08/21/20   Fay Records, MD  triamcinolone ointment (KENALOG) 0.1 % Apply a small amount to the affected area bid 07/09/20   [provider]  vitamin B-12 (CYANOCOBALAMIN) 500 MCG tablet Take 500 mcg by mouth daily.    [provider]  vitamin C (ASCORBIC ACID) 500 MG tablet Take 500 mg by mouth daily.    [provider]      Allergies    Flagyl [metronidazole], Penicillins, Sulfamethoxazole, Meperidine, Morphine and related, Propoxyphene, Tape, Doxycycline, Lanolin, and Sulfa antibiotics    Review of Systems   Review of Systems  Gastrointestinal:  Positive for nausea and vomiting.  Neurological:  Positive for dizziness.  All other systems reviewed and are negative.   Physical Exam Updated Vital Signs BP (!) 156/62 (BP Location: Right Arm)   Pulse 76   Temp (!) 100.5 F (38.1 C) (Axillary)   Resp (!) 22   Ht '5\' 2"'$  (1.575 m)   Wt 111.1 kg   LMP 02/14/1995   SpO2 99%   BMI 44.81 kg/m  Physical Exam Vitals and nursing note reviewed.  Constitutional:      Appearance: Normal appearance. She is obese.  HENT:     Head: Normocephalic and atraumatic.     Right Ear: External ear normal.     Left Ear: External ear normal.     Nose: Nose normal.     Mouth/Throat:     Mouth: Mucous membranes are dry.  Eyes:     Extraocular Movements: Extraocular movements intact.     Conjunctiva/sclera: Conjunctivae normal.     Pupils: Pupils are equal, round, and reactive to light.  Cardiovascular:     Rate and Rhythm: Normal rate and regular rhythm.     Pulses: Normal pulses.     Heart sounds: Normal heart sounds.  Pulmonary:     Effort: Pulmonary effort is normal.     Breath sounds: Normal breath sounds.  Abdominal:     General: Abdomen is flat. Bowel sounds are normal.     Palpations: Abdomen is soft.  Musculoskeletal:     Cervical  back: Normal range of motion and neck supple.     Right lower leg: Edema present.     Left lower leg: Edema present.  Skin:    General: Skin is warm and dry.     Capillary Refill: Capillary refill takes less than 2 seconds.  Neurological:     General: No focal deficit present.  Mental Status: She is alert and oriented to person, place, and time.  Psychiatric:        Mood and Affect: Mood normal.        Behavior: Behavior normal.     ED Results / Procedures / Treatments   Labs (all labs ordered are listed, but only abnormal results are displayed) Labs Reviewed  CBC WITH DIFFERENTIAL/PLATELET - Abnormal; Notable for the following components:      Result Value   WBC 17.4 (*)    RBC 2.72 (*)    Hemoglobin 8.8 (*)    HCT 27.3 (*)    MCV 100.4 (*)    RDW 16.3 (*)    Platelets 147 (*)    Neutro Abs 15.6 (*)    Abs Immature Granulocytes 0.49 (*)    All other components within normal limits  CBG MONITORING, ED - Abnormal; Notable for the following components:   Glucose-Capillary 123 (*)    All other components within normal limits  SARS CORONAVIRUS 2 BY RT PCR  CULTURE, BLOOD (ROUTINE X 2)  CULTURE, BLOOD (ROUTINE X 2)  COMPREHENSIVE METABOLIC PANEL  URINALYSIS, ROUTINE W REFLEX MICROSCOPIC  MAGNESIUM  TSH  LACTIC ACID, PLASMA  LACTIC ACID, PLASMA    EKG EKG Interpretation  Date/Time:  Thursday May 21 2022 13:15:43 EDT Ventricular Rate:  76 PR Interval:  136 QRS Duration: 137 QT Interval:  461 QTC Calculation: 519 R Axis:   -75 Text Interpretation: Sinus rhythm Nonspecific IVCD with LAD Left ventricular hypertrophy Anterior Q waves, possibly due to LVH No significant change since last tracing Confirmed by Isla Pence [phone]) on 05/21/2022 1:31:12 PM  Radiology DG Chest Port 1 View  Result Date: 05/21/2022 CLINICAL DATA:  Shortness of breath. EXAM: PORTABLE CHEST 1 VIEW COMPARISON:  CT 11/16/2020.  Radiographs 11/16/2020 and 09/21/2019. FINDINGS: 1358  hours. The left subclavian AICD/pacemaker leads appear unchanged. Stable mild cardiac enlargement and aortic atherosclerosis. There is chronic vascular congestion with a new left pleural effusion and increased left greater than right basilar pulmonary opacities. No pneumothorax. The bones appear unchanged. Telemetry leads overlie the chest. IMPRESSION: New left pleural effusion and increased left greater than right basilar pulmonary opacities which may reflect atelectasis or pneumonia. Chronic vascular congestion. Electronically Signed   By: Richardean Sale M.D.   On: 05/21/2022 14:10    Procedures Procedures    Medications Ordered in ED Medications  acetaminophen (TYLENOL) tablet 1,000 mg (has no administration in time range)  cefTRIAXone (ROCEPHIN) 1 g in sodium chloride 0.9 % 100 mL IVPB (has no administration in time range)    ED Course/ Medical Decision Making/ A&P                           Medical Decision Making Amount and/or Complexity of Data Reviewed Labs: ordered. Radiology: ordered.  Risk OTC drugs.   This patient presents to the ED for concern of n/v + defib firing, this involves an extensive number of treatment options, and is a complaint that carries with it a high risk of complications and morbidity.  The differential diagnosis includes cad, chf, electrolyte abn   Co morbidities that complicate the patient evaluation  morbid obesity, HTN, HLD, hypothyroidism, LBBB, chronic lymphedema, CML, CHF (EF 25-30%), vfib arrest s/p ICD, and CAD with a stent to the LAD   Additional history obtained:  Additional history obtained from epic chart review External records from outside source obtained and reviewed including EMS report  Lab Tests:  I Ordered, and personally interpreted labs.  The pertinent results include:  wbc elevated at 17.4, hgb 8.8    Imaging Studies ordered:  I ordered imaging studies including cxr  I independently visualized and interpreted imaging  which showed  IMPRESSION:  New left pleural effusion and increased left greater than right  basilar pulmonary opacities which may reflect atelectasis or  pneumonia. Chronic vascular congestion.   I agree with the radiologist interpretation   Cardiac Monitoring:  The patient was maintained on a cardiac monitor.  I personally viewed and interpreted the cardiac monitored which showed an underlying rhythm of: nsr Pacemaker interrogated:  9 episodes of >200bpm, 7 shocks from 10-8 through 10-12 with 5 today. sudden onset vfib rate of 240s - 260s, as well as non sustained v-tach several times   Medicines ordered and prescription drug management:  I ordered medication including rocephin  for pna  Reevaluation of the patient after these medicines showed that the patient stayed the same I have reviewed the patients home medicines and have made adjustments as needed   Test Considered:  cxr   Critical Interventions:  Cards consult   Consultations Obtained:  I requested consultation with the cardiology consult,  and discussed lab and imaging findings as well as pertinent plan - they will see pt   Problem List / ED Course:  VF/VT:  multiple defib firings   Reevaluation:  After the interventions noted above, I reevaluated the patient and found that they have :improved   Social Determinants of Health:  Lives at home   Dispostion:  After consideration of the diagnostic results and the patients response to treatment, I feel that the patent would benefit from admission.          Final Clinical Impression(s) / ED Diagnoses Final diagnoses:  Encounter for testing following appropriate discharge of implantable cardioverter-defibrillator  Ventricular fibrillation ([place])  Ventricular tachycardia [facility])    Rx / DC Orders ED Discharge Orders     None         Isla Pence, MD 05/21/22 1529

## 2022-05-21 NOTE — Assessment & Plan Note (Addendum)
Chronic, stable. - continue imatinib

## 2022-05-21 NOTE — Consult Note (Addendum)
Cardiology Consultation   Patient ID: Lisa [name] MRN: [id number]; DOB: August 23, 1940  Admit date: 05/21/2022 Date of Consult: 05/21/2022  PCP:  Patient, No Pcp Per   [place] [facility] Cardiologist:  [name], MD  Electrophysiologist:  Cristopher [name], MD       Patient Profile:   Lisa [name] is a 81 y.o. 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 Darl Householder.   Device information MDT CRT-D, implanted 01/20/2016  + appropriate therapies Feb 2021,  3 treated VF-zone episodes:  1 PMVT/VF episode 09/21/19 at 02:01, converted to AF/VS rhythm after 35J shock,  1 episode of AF w/RVR vs AF/VT (double tachycardia) on 09/21/19 at 03:15, ATP x1 failed, late break after 35J shock x1 (and conversion of AF to SR).  CATH this admission with NOD, LVEF 50% Started on amiodarone  AAD hx Amiodarone started 2021  History of Present Illness:   Lisa [name] was referred to the ER after reaching out to the office reporting ICD shocks and not feeling well  She reports that she has felt unwell, probably since Monday or so.  She denies overt fever but has had some shaking chills the last couple days and this morning nauseous with vomiting, no urinary symptoms. No CP No overt SOB, but has not taken her torsemide for 4 days with so many appointments these past days Reports compliance with all of her other meds  She has known chronic b/l LE wounds being treated at a wound clinic at [facility] getting a new tx she reported as "low frequency ultrasound" that is used to promote new cell growth as far as she understood. No  current outpatient antibiotics She was there last Tuesday, due for another treatment today  She arrives with low grade temp 100.5 (per RN taken at the  groin) BP stable SR  LABS K+ 3.1  Mag 1.6 BUN/Creat 10/1.11) Lactic Acid 2.1 WBC 17.4 H/H 8.8/27.3 Plts 147 TSH 1.142  COVID neg  ICD remote from today Battery and lead measurements are good  9 treated episodes 13 NSVT episodes  05/17/22: VF > ATP during charge failed > success with one shock 05/18/22 VF ATP failed > spontaneous conversion, shock aborted 05/20/22: VF ATP failed > successful shock  05/21/22: (6) VF episodes, all ATPs failed, all shocks were successful  OptiVol is high/above threshold   Past Medical History:  Diagnosis Date   CAD in native artery 2017   stent to LAD   Cardiac arrest ([place]) 09/2015   CHF (congestive heart failure) (HCC)    CML (chronic myelocytic leukemia) ([place])    Complication of anesthesia    Hx: UTI (urinary tract infection)    Hypertension    Melanoma ([place])    PONV (postoperative nausea and vomiting)    PVC (premature ventricular contraction)    HISTORY OF   Thyroid disease    V tach ([place])     Past Surgical History:  Procedure Laterality Date   ABDOMINAL HYSTERECTOMY     APPENDECTOMY     CARDIAC CATHETERIZATION N/A 10/03/2015   Procedure: Left Heart Cath and Coronary Angiography;  Surgeon: [name], MD;  Location: [place] [facility];  Service: Cardiovascular;  Laterality: N/A;   CARDIAC CATHETERIZATION N/A 10/07/2015  Procedure: Coronary Stent Intervention;  Surgeon: [name], MD;  Location: [place] [facility];  Service: Cardiovascular;  Laterality: N/A;   CARDIAC CATHETERIZATION  09/22/2019   EP IMPLANTABLE DEVICE N/A 01/20/2016   Procedure: BiV ICD Insertion CRT-D;  Surgeon: [name], MD;  Location: [place] [facility];  Service: Cardiovascular;  Laterality: N/A;   LEFT HEART CATH AND CORONARY ANGIOGRAPHY N/A 09/22/2019   Procedure: LEFT HEART CATH AND CORONARY ANGIOGRAPHY;  Surgeon: Martinique, Peter M, MD;  Location: [place] [facility];  Service: Cardiovascular;  Laterality: N/A;   TONSILLECTOMY      TONSILLECTOMY     TUBAL LIGATION       Home Medications:  Prior to Admission medications   Medication Sig Start Date End Date Taking? Authorizing Provider  acetaminophen (TYLENOL) 500 MG tablet Take 500-1,000 mg by mouth every 6 (six) hours as needed for moderate pain.    [provider]  allopurinol (ZYLOPRIM) 100 MG tablet Take 100 mg by mouth daily.  06/15/17   [provider]  amiodarone (PACERONE) 200 MG tablet Take 1 tablet (200 mg total) by mouth daily. 08/06/21   [name], MD  aspirin EC 81 MG tablet Take 81 mg by mouth at bedtime.     [provider]  atorvastatin (LIPITOR) 20 MG tablet Take 1 tablet (20 mg total) by mouth daily. 05/07/16   Bensimhon, Shaune Pascal, MD  b complex vitamins capsule Take 1 capsule by mouth daily.    [provider]  calcium carbonate (OS-CAL - DOSED IN MG OF ELEMENTAL CALCIUM) 1250 (500 Ca) MG tablet Take 1 tablet by mouth.    [provider]  carvedilol (COREG) 25 MG tablet Take 1 tablet (25 mg total) by mouth 2 (two) times daily. 12/01/21   Fay Records, MD  Cholecalciferol (VITAMIN D-3 PO) Take 5,000 Units by mouth daily.    [provider]  ferrous sulfate 325 (65 FE) MG EC tablet Take 1 tablet by mouth 3 (three) times a week. 06/27/21   [provider]  imatinib (GLEEVEC) 400 MG tablet Take 400 mg by mouth daily with supper.  02/16/17   [provider]  Inulin (METAMUCIL CLEAR & NATURAL PO) Take by mouth See admin instructions. Mix 1 teaspoonful into 8 ounces of water and drink as needed    [provider]  Loperamide HCl 1 MG/7.5ML LIQD Take by mouth as needed.    [provider]  Magnesium Oxide 400 MG CAPS Take 1 capsule (400 mg total) by mouth 2 (two) times daily. 10/18/19   Baldwin Jamaica, PA-C  Multiple Vitamin (MULTIVITAMIN WITH MINERALS) TABS tablet Take 1 tablet by mouth daily.    [provider]  nitroGLYCERIN (NITROSTAT) 0.4 MG SL tablet  Place 1 tablet (0.4 mg total) under the tongue every 5 (five) minutes x 3 doses as needed for chest pain. 12/24/20   Fay Records, MD  potassium chloride (KLOR-CON) 10 MEQ tablet Take 10 mEq by mouth as directed. Take with Torsemide    [provider]  thyroid (ARMOUR) 15 MG tablet Take 15 mg by mouth daily before breakfast.     [provider]  torsemide (DEMADEX) 20 MG tablet Take 1 tablet (20 mg total) by mouth as directed. 08/21/20   Fay Records, MD  triamcinolone ointment (KENALOG) 0.1 % Apply a small amount to the affected area bid 07/09/20   [provider]  vitamin B-12 (CYANOCOBALAMIN) 500 MCG  tablet Take 500 mcg by mouth daily.    [provider]  vitamin C (ASCORBIC ACID) 500 MG tablet Take 500 mg by mouth daily.    [provider]    Inpatient Medications: Scheduled Meds:  acetaminophen  1,000 mg Oral Once   amiodarone  150 mg Intravenous Once   furosemide  40 mg Intravenous Once   mexiletine  250 mg Oral Q12H   potassium chloride  40 mEq Oral Once   Continuous Infusions:  amiodarone     Followed by   amiodarone     cefTRIAXone (ROCEPHIN)  IV     magnesium sulfate bolus IVPB     potassium chloride     PRN Meds:   Allergies:    Allergies  Allergen Reactions   Flagyl [Metronidazole] Itching, Rash and Other (See Comments)    Welts, also   Penicillins Hives, Itching and Rash    Has patient had a PCN reaction causing immediate rash, facial/tongue/throat swelling, SOB or lightheadedness with hypotension: Yes  Has patient had a PCN reaction causing severe rash involving mucus membranes or skin necrosis: Yes Has patient had a PCN reaction that required hospitalization No Has patient had a PCN reaction occurring within the last 10 years: NO If all of the above answers are "NO", then may proceed with Cephalosporin use.    Sulfamethoxazole Rash   Meperidine Nausea And Vomiting   Morphine And Related Nausea And Vomiting    Propoxyphene     Other reaction(s): Unknown   Tape Other (See Comments)    "THE PLASTIC, CLEAR TAPE CAN/DOES PULL OFF MY SKIN."   Doxycycline Nausea Only, Rash and Other (See Comments)    Made her "feel terrible"   Lanolin Itching and Rash   Sulfa Antibiotics Rash    Social History:   Social History   Socioeconomic History   Marital status: Divorced    Spouse name: Not on file   Number of children: Not on file   Years of education: Not on file   Highest education level: Not on file  Occupational History   Not on file  Tobacco Use   Smoking status: Never   Smokeless tobacco: Never  Vaping Use   Vaping Use: Never used  Substance and Sexual Activity   Alcohol use: No    Alcohol/week: 0.0 standard drinks of alcohol   Drug use: No   Sexual activity: Not on file  Other Topics Concern   Not on file  Social History Narrative   Not on file   Social Determinants of Health   Financial Resource Strain: Not on file  Food Insecurity: Not on file  Transportation Needs: Not on file  Physical Activity: Not on file  Stress: Not on file  Social Connections: Not on file  Intimate Partner Violence: Not on file    Family History:   Family History  Problem Relation Age of Onset   Coronary artery disease Mother    Thyroid disease Mother    Multiple myeloma Father    Heart attack Maternal Grandfather    Heart attack Paternal Grandfather      ROS:  Please see the history of present illness.  All other ROS reviewed and negative.     Physical Exam/Data:   Vitals:   05/21/22 1352 05/21/22 1408 05/21/22 1431 05/21/22 1530  BP:  (!) 156/62  (!) 149/64  Pulse:  76  75  Resp:  (!) 22  (!) 26  Temp:   (!) 100.5 F (  38.1 C)   TempSrc:   Axillary   SpO2:  99%  95%  Weight: 111.1 kg     Height: '5\' 2"'  (1.575 m)      No intake or output data in the 24 hours ending 05/21/22 1611    05/21/2022    1:52 PM 04/29/2022   11:30 AM 10/28/2021   11:55 AM  Last 3 Weights  Weight (lbs)  245 lb 240 lb 242 lb  Weight (kg) 111.131 kg 108.863 kg 109.77 kg     Body mass index is 44.81 kg/m.  General:  Well nourished, well developed, in no acute distress, + for pallor, appears ill HEENT: normal Neck: no JVD Vascular: No carotid bruits; Distal pulses 2+ bilaterally Cardiac: RRR; no murmurs, gallops or rubs Lungs:  crackles b/l bases, no wheezing, rhonchi or rales  Abd: soft, nontender, obese Ext: unaboots b/l, marked edema,. LLE above the knee with some erythema, slightly warm Musculoskeletal:  No deformities Skin: warm and dry  Neuro:  no focal abnormalities noted Psych:  Normal affect   EKG:  The EKG was personally reviewed and demonstrates:   SR/V paced (QRS 153m) 76bpm  Telemetry:  Telemetry was personally reviewed and demonstrates:   SR  Relevant CV Studies:  06/18/21: TTE 1. There is turbulence in distal outflow tract which is contributing to  murmur (no significant gradiet). Left ventricular ejection fraction, by  estimation, is 45 to 50%. The left ventricle has mildly decreased  function. The left ventricle demonstrates  global hypokinesis. The left ventricular internal cavity size was mildly  to moderately dilated. There is mild left ventricular hypertrophy of the  posterior segment. Left ventricular diastolic parameters are consistent  with Grade II diastolic dysfunction  (pseudonormalization).   2. Right ventricular systolic function is normal. The right ventricular  size is normal. There is normal pulmonary artery systolic pressure.   3. Left atrial size was severely dilated.   4. Right atrial size was mildly dilated.   5. The mitral valve is normal in structure. Moderate mitral valve  regurgitation. No evidence of mitral stenosis.   6. The aortic valve is tricuspid. There is moderate calcification of the  aortic valve. There is mild thickening of the aortic valve. Aortic valve  regurgitation is not visualized. Mild to moderate aortic valve   sclerosis/calcification is present, without  any evidence of aortic stenosis.   7. The inferior vena cava is dilated in size with >50% respiratory  variability, suggesting right atrial pressure of 8 mmHg.   Comparison(s): Prior images reviewed side by side.    09/22/2019: LHC Mid LM to Dist LM lesion is 20% stenosed. Previously placed Mid LAD stent (unknown type) is widely patent. Ramus lesion is 65% stenosed. The left ventricular systolic function is normal. LV end diastolic pressure is moderately elevated. The left ventricular ejection fraction is 50-55% by visual estimate.   1. Nonobstructive CAD. The stent in the mid LAD is widely patent. There is modest ostial disease in a small ramus branch that is unchanged from 2017. 2. Mild LV dysfunction. EF 50%. 3. Elevated LVEDP 23 mm Hg.   Laboratory Data:  High Sensitivity Troponin:  No results for input(s): "TROPONINIHS" in the last 720 hours.   Chemistry Recent Labs  Lab 05/21/22 1401  NA 139  K 3.1*  CL 107  CO2 23  GLUCOSE 138*  BUN 10  CREATININE 1.11*  CALCIUM 7.9*  MG 1.6*  GFRNONAA 50*  ANIONGAP 9    Recent Labs  Lab 05/21/22 1401  PROT 5.9*  ALBUMIN 2.7*  AST 37  ALT 29  ALKPHOS 66  BILITOT 1.4*   Lipids No results for input(s): "CHOL", "TRIG", "HDL", "LABVLDL", "LDLCALC", "CHOLHDL" in the last 168 hours.  Hematology Recent Labs  Lab 05/21/22 1401  WBC 17.4*  RBC 2.72*  HGB 8.8*  HCT 27.3*  MCV 100.4*  MCH 32.4  MCHC 32.2  RDW 16.3*  PLT 147*   Thyroid  Recent Labs  Lab 05/21/22 1401  TSH 1.142    BNPNo results for input(s): "BNP", "PROBNP" in the last 168 hours.  DDimer No results for input(s): "DDIMER" in the last 168 hours.   Radiology/Studies:  DG Chest Port 1 View Result Date: 05/21/2022 CLINICAL DATA:  Shortness of breath. EXAM: PORTABLE CHEST 1 VIEW COMPARISON:  CT 11/16/2020.  Radiographs 11/16/2020 and 09/21/2019. FINDINGS: 1358 hours. The left subclavian AICD/pacemaker  leads appear unchanged. Stable mild cardiac enlargement and aortic atherosclerosis. There is chronic vascular congestion with a new left pleural effusion and increased left greater than right basilar pulmonary opacities. No pneumothorax. The bones appear unchanged. Telemetry leads overlie the chest. IMPRESSION: New left pleural effusion and increased left greater than right basilar pulmonary opacities which may reflect atelectasis or pneumonia. Chronic vascular congestion. Electronically Signed   By: Richardean Sale M.D.   On: 05/21/2022 14:10     Assessment and Plan:   VF storm She has had in the past Known single vessel CAD 2017, though no progression of disease in 2021 with her VF then I don't think she will need a new ischemic w/u Suspect this is provoked by infection/illness, acute HF off her diuretic  CAD No anginal sounding symptoms EKG without ischemic changes  Acute/chronic CHF She missed some days of her torsemide Massive LE edema (mostly chronic) though she can tell more then her usual baseline Crackles at the bases  Dr. Quentin Ore has seen the patient  Recommend ICU admission with IM team Hold coreg for now with suspect acute infection/febrile illness We have ordered electrolyte replacement Amiodarone and started mexiletine Lasix IV   Fever Leukocytosis Hx of CML , though WBC have not been elevated for some time it seems LE wounds Wound RN consult ordered BC already ordered and ABX via ER MD 7.   Anemia Appears chronic 8   CML Follows with [facility]   Risk Assessment/Risk Scores:    For questions or updates, please contact [place] Please consult [email or web address] for contact info under    Signed, Baldwin Jamaica, PA-C  05/21/2022 4:11 PM

## 2022-05-21 NOTE — Assessment & Plan Note (Addendum)
Stable on RA. No longer tachypneic or hypoxic. She is tolerating IV abx well. -Continue IV rocephin 1 g daily (day 2/5) -Continue azithromycin 500 mg daily (day 2/3) -Mucinex for phlegm and congestion -F/u Bcx -Continuous O2 monitoring, vitals per shift, wean to RA as tolerated

## 2022-05-21 NOTE — Assessment & Plan Note (Addendum)
Stable. Suspect increase in episodes from pneumonia. -Continue to hold coreg -Replete electrolytes prn -Continue amiodarone drip, mexiletine -Cardiology following, appreciate recommendations -Continuous cardiac monitoring

## 2022-05-21 NOTE — Progress Notes (Signed)
FMTS Brief Progress Note  S: In to check on patient with Dr. Joeseph Amor. She was feeling well. Had no complaints.   O: BP (!) 149/46   Pulse 75   Temp (!) 100.5 F (38.1 C) (Axillary)   Resp (!) 22   Ht '5\' 2"'$  (1.575 m)   Wt 111.1 kg   LMP 02/14/1995   SpO2 94%   BMI 44.81 kg/m   Gen: Elderly female sitting up on bed, in NAD Resp: On <1L [place], saturating 92% and greater, clear anteriorly  Card: RRR Ext: Lymphedema to b/l legs  A/P: Sepsis 2/2 PNA -Continue abx and plan as above -Wean oxygen as able, keep sats >92%  Ventricular fib -Continues on amio gtt  -Optimize electrolytes   No current change in plan, continue plan per H&P  - Orders reviewed. Labs for AM ordered, which was adjusted as needed.   Sharion Settler, DO 05/21/2022, 9:12 PM PGY-3, [facility] Night Resident  Please page [phone] with questions.

## 2022-05-21 NOTE — Assessment & Plan Note (Addendum)
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 Likely hypervolemic secondary to medication nonadherence. Echo today without significant changes from previous. - IV lasix 40 mg BID per cardiology, s/p one dose IV lasix 40 and one dose 80 - strict I/O, daily weights

## 2022-05-21 NOTE — ED Provider Notes (Signed)
  Physical Exam  BP (!) 149/46   Pulse 75   Temp (!) 100.5 F (38.1 C) (Axillary)   Resp (!) 22   Ht '5\' 2"'$  (1.575 m)   Wt 111.1 kg   LMP 02/14/1995   SpO2 94%   BMI 44.81 kg/m   Physical Exam  Procedures  Procedures  ED Course / MDM    Medical Decision Making Care assumed at 3 pm.  Patient has a defibrillator that fired several times.  Patient also has a fever.  Patient has pneumonia on chest x-ray.  Signed out pending cardiology consult and likely admission by medicine team  6:23 PM Patient's white blood cell count is 17.  Given IV antibiotics.  Cardiology started patient on amiodarone drip.  Family practice to admit.  Patient is difficult IV stick so I was able to put ultrasound IV.  Patient will be admitted to stepdown  CRITICAL CARE Performed by: Wandra Arthurs   Total critical care time: 30 minutes  Critical care time was exclusive of separately billable procedures and treating other patients.  Critical care was necessary to treat or prevent imminent or life-threatening deterioration.  Critical care was time spent personally by me on the following activities: development of treatment plan with patient and/or surrogate as well as nursing, discussions with consultants, evaluation of patient's response to treatment, examination of patient, obtaining history from patient or surrogate, ordering and performing treatments and interventions, ordering and review of laboratory studies, ordering and review of radiographic studies, pulse oximetry and re-evaluation of patient's condition.  Angiocath insertion Performed by: Wandra Arthurs  Consent: Verbal consent obtained. Risks and benefits: risks, benefits and alternatives were discussed Time out: Immediately prior to procedure a "time out" was called to verify the correct patient, procedure, equipment, support staff and site/side marked as required.  Preparation: Patient was prepped and draped in the usual sterile fashion.  Vein  Location: R forearm   Ultrasound Guided  Gauge: 20 long   Normal blood return and flush without difficulty Patient tolerance: Patient tolerated the procedure well with no immediate complications.     Problems Addressed: Community acquired pneumonia, unspecified laterality: acute illness or injury Ventricular fibrillation [facility]): acute illness or injury Ventricular tachycardia ([place]): acute illness or injury  Amount and/or Complexity of Data Reviewed Labs: ordered. Decision-making details documented in ED Course. Radiology: ordered and independent interpretation performed. Decision-making details documented in ED Course. ECG/medicine tests: ordered. Decision-making details documented in ED Course.  Risk OTC drugs. Decision regarding hospitalization.          Drenda Freeze, MD 05/21/22 Jeri Lager

## 2022-05-21 NOTE — Assessment & Plan Note (Addendum)
Chronic. Wound care has seen and provided recommendations for chronic wounds. Continuing abx coverage as above and follow Bcx. -Continue IV lasix 40 mg BID per cardiology, monitor output and lytes

## 2022-05-21 NOTE — Telephone Encounter (Signed)
Outreach made to Pt's daughter d/t Pt not showing up as being at [facility] after being advised to call 911.   Pt's daughter advised Pt had recent therapy from her device and called device clinic this AM to report further symptoms.  Unable to get a remote check.    Daughter states she will contact family members to make outreach to Pt.  Will continue to monitor.

## 2022-05-21 NOTE — ED Triage Notes (Signed)
Pt BIB [facility] from home, reports defibrillator fired a few days ago. Pt also stopped taking fluid pill a week ago and reports increased leg swelling. Pt c/o intermittent dizziness that feels like just before she gets shocked. Pt 85% on RA.

## 2022-05-21 NOTE — Telephone Encounter (Signed)
Spoke with patient over the phone to assess her condition.  She wants to send [place] a transmission because she is feeling very poorly but is getting an error code.  The phone connection was not great and made evaluating patient more complex.  Symptoms:  (patient unable to check bp/pulse with meter at home while on phone due to exertional weakness).  Extremely Weak Active nausea and vomiting, to the point that she could not speak with without vomiting Denies SOB or CP but definitely sounded winded on the phone Severe Dizziness Confirmed VF with ATP and 1 shock on 10/9 (had sx of dizziness at the time).   Reports (and confirmed on 10/9, recent VF with 1 shock), patient denies any shocks since but is feeling very poorly.   Given patient's symptoms and the fact we cannot connect with device due to communication failure, I told patient she needs to call 911 and go to the hospital.  Patient did not want to do this and states, "Let me get a little stronger and then maybe I will go".  I explicitly told her this is not a good plan, that her current poor condition is why she needs to go to the hospital.  ER can connect with her device and take an in depth look at what's going on and at minimum sounds like she may need IV fluids and meds to control nausea. I went on to explain that with recent VF and shock, she needs to go immediately.  I offered to call a family member but patient refused.  I asked her if she would call 911 immediately after we hang up and she agreed.

## 2022-05-21 NOTE — Assessment & Plan Note (Addendum)
BP soft. -Holding carvedilol -Continue diuresis -Monitor BP

## 2022-05-21 NOTE — Telephone Encounter (Signed)
The patient called because she was having dizzy spell. I had a hard time understanding her. I asked her if she needs me to call 911 for her and she states No. I got the nurse attention and put her on speaker phone.

## 2022-05-22 ENCOUNTER — Ambulatory Visit (HOSPITAL_BASED_OUTPATIENT_CLINIC_OR_DEPARTMENT_OTHER): Payer: Medicare Other

## 2022-05-22 DIAGNOSIS — J9601 Acute respiratory failure with hypoxia: Secondary | ICD-10-CM | POA: Diagnosis present

## 2022-05-22 DIAGNOSIS — J189 Pneumonia, unspecified organism: Secondary | ICD-10-CM | POA: Diagnosis present

## 2022-05-22 DIAGNOSIS — I5022 Chronic systolic (congestive) heart failure: Secondary | ICD-10-CM

## 2022-05-22 DIAGNOSIS — R6520 Severe sepsis without septic shock: Secondary | ICD-10-CM | POA: Diagnosis present

## 2022-05-22 DIAGNOSIS — N1832 Chronic kidney disease, stage 3b: Secondary | ICD-10-CM | POA: Insufficient documentation

## 2022-05-22 DIAGNOSIS — Z79899 Other long term (current) drug therapy: Secondary | ICD-10-CM | POA: Diagnosis not present

## 2022-05-22 DIAGNOSIS — I130 Hypertensive heart and chronic kidney disease with heart failure and stage 1 through stage 4 chronic kidney disease, or unspecified chronic kidney disease: Secondary | ICD-10-CM | POA: Diagnosis present

## 2022-05-22 DIAGNOSIS — Z6841 Body Mass Index (BMI) 40.0 and over, adult: Secondary | ICD-10-CM | POA: Diagnosis not present

## 2022-05-22 DIAGNOSIS — I472 Ventricular tachycardia, unspecified: Secondary | ICD-10-CM | POA: Diagnosis not present

## 2022-05-22 DIAGNOSIS — A419 Sepsis, unspecified organism: Secondary | ICD-10-CM | POA: Diagnosis present

## 2022-05-22 DIAGNOSIS — E785 Hyperlipidemia, unspecified: Secondary | ICD-10-CM | POA: Diagnosis present

## 2022-05-22 DIAGNOSIS — E876 Hypokalemia: Secondary | ICD-10-CM | POA: Diagnosis present

## 2022-05-22 DIAGNOSIS — I890 Lymphedema, not elsewhere classified: Secondary | ICD-10-CM

## 2022-05-22 DIAGNOSIS — I5030 Unspecified diastolic (congestive) heart failure: Secondary | ICD-10-CM

## 2022-05-22 DIAGNOSIS — Z1152 Encounter for screening for COVID-19: Secondary | ICD-10-CM | POA: Diagnosis not present

## 2022-05-22 DIAGNOSIS — I2510 Atherosclerotic heart disease of native coronary artery without angina pectoris: Secondary | ICD-10-CM | POA: Diagnosis present

## 2022-05-22 DIAGNOSIS — I428 Other cardiomyopathies: Secondary | ICD-10-CM | POA: Diagnosis present

## 2022-05-22 DIAGNOSIS — N179 Acute kidney failure, unspecified: Secondary | ICD-10-CM | POA: Insufficient documentation

## 2022-05-22 DIAGNOSIS — I447 Left bundle-branch block, unspecified: Secondary | ICD-10-CM | POA: Diagnosis present

## 2022-05-22 DIAGNOSIS — D539 Nutritional anemia, unspecified: Secondary | ICD-10-CM | POA: Insufficient documentation

## 2022-05-22 DIAGNOSIS — I4901 Ventricular fibrillation: Secondary | ICD-10-CM

## 2022-05-22 DIAGNOSIS — Z85820 Personal history of malignant melanoma of skin: Secondary | ICD-10-CM | POA: Diagnosis not present

## 2022-05-22 DIAGNOSIS — I5023 Acute on chronic systolic (congestive) heart failure: Secondary | ICD-10-CM | POA: Diagnosis present

## 2022-05-22 DIAGNOSIS — C9210 Chronic myeloid leukemia, BCR/ABL-positive, not having achieved remission: Secondary | ICD-10-CM | POA: Diagnosis present

## 2022-05-22 DIAGNOSIS — I872 Venous insufficiency (chronic) (peripheral): Secondary | ICD-10-CM | POA: Diagnosis present

## 2022-05-22 DIAGNOSIS — E039 Hypothyroidism, unspecified: Secondary | ICD-10-CM | POA: Diagnosis present

## 2022-05-22 HISTORY — DX: Acute kidney failure, unspecified: N17.9

## 2022-05-22 LAB — ECHOCARDIOGRAM COMPLETE
AR max vel: 2.04 cm2
AV Area VTI: 2.03 cm2
AV Area mean vel: 2.02 cm2
AV Mean grad: 10 mmHg
AV Peak grad: 16 mmHg
Ao pk vel: 2 m/s
Area-P 1/2: 3.36 cm2
Calc EF: 48.5 %
Height: 62 in
S' Lateral: 5 cm
Single Plane A2C EF: 57.1 %
Single Plane A4C EF: 38.3 %
Weight: 4056.46 oz

## 2022-05-22 LAB — CBC
HCT: 24.2 % — ABNORMAL LOW (ref 36.0–46.0)
Hemoglobin: 8 g/dL — ABNORMAL LOW (ref 12.0–15.0)
MCH: 32.5 pg (ref 26.0–34.0)
MCHC: 33.1 g/dL (ref 30.0–36.0)
MCV: 98.4 fL (ref 80.0–100.0)
Platelets: 132 10*3/uL — ABNORMAL LOW (ref 150–400)
RBC: 2.46 MIL/uL — ABNORMAL LOW (ref 3.87–5.11)
RDW: 16.2 % — ABNORMAL HIGH (ref 11.5–15.5)
WBC: 15.9 10*3/uL — ABNORMAL HIGH (ref 4.0–10.5)
nRBC: 0 % (ref 0.0–0.2)

## 2022-05-22 LAB — BASIC METABOLIC PANEL
Anion gap: 7 (ref 5–15)
BUN: 15 mg/dL (ref 8–23)
CO2: 23 mmol/L (ref 22–32)
Calcium: 7.6 mg/dL — ABNORMAL LOW (ref 8.9–10.3)
Chloride: 106 mmol/L (ref 98–111)
Creatinine, Ser: 1.3 mg/dL — ABNORMAL HIGH (ref 0.44–1.00)
GFR, Estimated: 42 mL/min — ABNORMAL LOW (ref >60)
Glucose, Bld: 119 mg/dL — ABNORMAL HIGH (ref 70–99)
Potassium: 3.7 mmol/L (ref 3.5–5.1)
Sodium: 136 mmol/L (ref 135–145)

## 2022-05-22 LAB — MAGNESIUM: Magnesium: 2.1 mg/dL (ref 1.7–2.4)

## 2022-05-22 MED ORDER — POTASSIUM CHLORIDE 10 MEQ/100ML IV SOLN
INTRAVENOUS | Status: AC
  Administered 2022-05-22: 10 meq via INTRAVENOUS
  Filled 2022-05-22: qty 100

## 2022-05-22 MED ORDER — FUROSEMIDE 10 MG/ML IJ SOLN
40.0000 mg | Freq: Two times a day (BID) | INTRAMUSCULAR | Status: DC
  Administered 2022-05-22 – 2022-05-26 (×9): 40 mg via INTRAVENOUS
  Filled 2022-05-22 (×9): qty 4

## 2022-05-22 MED ORDER — POTASSIUM CHLORIDE 20 MEQ PO PACK
40.0000 meq | PACK | Freq: Once | ORAL | Status: AC
  Administered 2022-05-22: 40 meq via ORAL
  Filled 2022-05-22: qty 2

## 2022-05-22 NOTE — Evaluation (Signed)
Physical Therapy Evaluation Patient Details Name: Lisa [name] MRN: [id number] DOB: Apr 12, 1941 Today's Date: 05/22/2022  History of Present Illness  81 yo female presents to [facility] on 10/12 with dizziness, increased LE swelling from stopping "fluid pill" x1 week ago, and ICD firings. Workup for sepsis PNA, VF storm. 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  Clinical Impression   Pt presents with generalized weakness, LLE pain, impaired mobility, impaired balance, and decreased activity tolerance. Pt to benefit from acute PT to address deficits. Pt requiring min-mod physical assist for transfer-level mobility at this time, pt lives home alone with no help for mobility at baseline. Pt declines discussion about other d/c venues besides home, recommending HHPT and increased support from family if possible. PT to progress mobility as tolerated, and will continue to follow acutely.         Recommendations for follow up therapy are one component of a multi-disciplinary discharge planning process, led by the attending physician.  Recommendations may be updated based on patient status, additional functional criteria and insurance authorization.  Follow Up Recommendations Home health PT (pt lives alone, states she will be d/c home so SNF is not an option)      Assistance Recommended at Discharge Frequent or constant Supervision/Assistance  Patient can return home with the following  A little help with walking and/or transfers;A little help with bathing/dressing/bathroom;Help with stairs or ramp for entrance    Equipment Recommendations None recommended by PT  Recommendations for Other Services       Functional Status Assessment Patient has had a recent decline in their functional status and demonstrates the ability to make significant improvements in function in a reasonable and predictable amount of  time.     Precautions / Restrictions Precautions Precautions: Fall Restrictions Weight Bearing Restrictions: No      Mobility  Bed Mobility Overal bed mobility: Needs Assistance Bed Mobility: Supine to Sit, Sit to Supine     Supine to sit: Mod assist Sit to supine: Mod assist   General bed mobility comments: assist for trunk elevation/lowering, LE translation to/from EOB, requires boost up assist upon return to supine. Increased time    Transfers Overall transfer level: Needs assistance Equipment used: Rolling walker (2 wheels) Transfers: Sit to/from Stand, Bed to chair/wheelchair/BSC Sit to Stand: Min assist, From elevated surface   Step pivot transfers: Min assist, From elevated surface       General transfer comment: assist for power up, steadying upon standing. tolerates lateral stepping and anterior stepping x2 each direction    Ambulation/Gait                  Stairs            Wheelchair Mobility    Modified Rankin (Stroke Patients Only)       Balance Overall balance assessment: Needs assistance Sitting-balance support: No upper extremity supported, Feet supported Sitting balance-Leahy Scale: Fair     Standing balance support: Bilateral upper extremity supported, During functional activity Standing balance-Leahy Scale: Poor Standing balance comment: reliant on external support                             Pertinent Vitals/Pain Pain Assessment Pain Assessment: 0-10 Pain Score: 6  Pain Location: LLE Pain Descriptors / Indicators: Sore, Aching Pain Intervention(s): Limited activity within patient's tolerance, Monitored  during session, Repositioned    Home Living Family/patient expects to be discharged to:: Private residence Living Arrangements: Alone Available Help at Discharge: Family;Available PRN/intermittently Type of Home: House Home Access: Stairs to enter Entrance Stairs-Rails: None Entrance Stairs-Number of  Steps: 3   Home Layout: Two level;Able to live on main level with bedroom/bathroom Home Equipment: [name] - single point;Shower seat      Prior Function Prior Level of Function : Independent/Modified Independent             Mobility Comments: uses cane in community. Has to use pillow cases to lift legs in/out of bed, in/out of car. Pt now cannot drive again for 6 months since ICD firings       Hand Dominance   Dominant Hand: Right    Extremity/Trunk Assessment   Upper Extremity Assessment Upper Extremity Assessment: Defer to OT evaluation    Lower Extremity Assessment Lower Extremity Assessment: Generalized weakness;LLE deficits/detail LLE Deficits / Details: LE wounds L>R, bilat LE edema distal>proximal LLE: Unable to fully assess due to pain    Cervical / Trunk Assessment Cervical / Trunk Assessment: Normal  Communication   Communication: No difficulties  Cognition Arousal/Alertness: Awake/alert Behavior During Therapy: WFL for tasks assessed/performed Overall Cognitive Status: Within Functional Limits for tasks assessed                                 General Comments: self-limiting        General Comments General comments (skin integrity, edema, etc.): vss >92% on 2LO2    Exercises General Exercises - Lower Extremity Long Arc Quad: AROM, Both, 10 reps, Seated Hip Flexion/Marching: AROM, Both, 10 reps, Seated   Assessment/Plan    PT Assessment Patient needs continued PT services  PT Problem List Decreased strength;Decreased mobility;Decreased balance;Decreased knowledge of use of DME;Pain;Decreased activity tolerance;Decreased safety awareness;Cardiopulmonary status limiting activity       PT Treatment Interventions DME instruction;Therapeutic activities;Gait training;Therapeutic exercise;Patient/family education;Balance training;Stair training;Functional mobility training;Neuromuscular re-education    PT Goals (Current goals can be found  in the Care Plan section)  Acute Rehab PT Goals PT Goal Formulation: With patient Time For Goal Achievement: 06/05/22 Potential to Achieve Goals: Good    Frequency Min 3X/week     Co-evaluation               AM-PAC PT "6 Clicks" Mobility  Outcome Measure Help needed turning from your back to your side while in a flat bed without using bedrails?: A Little Help needed moving from lying on your back to sitting on the side of a flat bed without using bedrails?: A Little Help needed moving to and from a bed to a chair (including a wheelchair)?: A Little Help needed standing up from a chair using your arms (e.g., wheelchair or bedside chair)?: A Little Help needed to walk in hospital room?: A Lot Help needed climbing 3-5 steps with a railing? : A Lot 6 Click Score: 16    End of Session Equipment Utilized During Treatment: Oxygen Activity Tolerance: Patient tolerated treatment well Patient left: in bed;with call bell/phone within reach;with bed alarm set;with family/visitor present Nurse Communication: Mobility status PT Visit Diagnosis: Other abnormalities of gait and mobility (R26.89);Muscle weakness (generalized) (M62.81)    Time: 7510-2585 PT Time Calculation (min) (ACUTE ONLY): 31 min   Charges:   PT Evaluation $PT Eval Low Complexity: 1 Low PT Treatments $Therapeutic Activity: 8-22 mins  Stacie Glaze, PT DPT Acute Rehabilitation Services Pager [phone]  Office [phone]   Louis Matte 05/22/2022, 12:58 PM

## 2022-05-22 NOTE — Assessment & Plan Note (Addendum)
Around baseline, Cr 1.2-1.3. -Monitor while diuresing with daily BMP

## 2022-05-22 NOTE — Assessment & Plan Note (Signed)
Hgb stable at 10 yesterday.

## 2022-05-22 NOTE — ED Notes (Signed)
Pt transported to unit, handoff given to receiving RN. Pt in no acute distress noted.

## 2022-05-22 NOTE — Progress Notes (Signed)
Daily Progress Note Intern Pager: [phone]  Patient name: Lisa [name] Medical record number: [id number] Date of birth: [birth year] Age: 81 y.o. Gender: female  Primary Care Provider: Patient, No Pcp Per Consultants: Cardiology Code Status: Full  Pt Overview and Major Events to Date:  10/12 - admitted  Assessment and Plan:  BRAYLEY MACKOWIAK is a 81 y.o. female who presented with dizziness, cough, and SOB and was found to have pneumonia and accompanying VF episodes with shocks per ICD. 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   * Sepsis due to pneumonia ([place]) Stable on RA. No longer tachypneic or hypoxic. She is tolerating IV abx well. -Continue IV rocephin 1 g daily (day 2/5) -Continue azithromycin 500 mg daily (day 2/3) -Mucinex for phlegm and congestion -F/u Bcx -Continuous O2 monitoring, vitals per shift, wean to RA as tolerated  Ventricular fibrillation (HCC) Stable. Suspect increase in episodes from pneumonia. -Continue to hold coreg -Replete electrolytes prn -Continue amiodarone drip, mexiletine -Cardiology following, appreciate recommendations -Continuous cardiac monitoring  Lymphedema Chronic. Wound care has seen and provided recommendations for chronic wounds. Continuing abx coverage as above and follow Bcx. -Continue IV lasix 40 mg BID per cardiology, monitor output and lytes  CML (chronic myelocytic leukemia) ([place]) Follows with [facility] for oral chemotherapy. -Continue home meds  Essential hypertension BP elevated 120-140s/40-50s. Takes coreg and torsemide at home. -Hold coreg per cardiology -Continue IV lasix -Monitor BP  Stage 3b chronic kidney disease (CKD) ([place]) Baseline Cr possibly around 1.2-1.3. -Monitor while diuresing with daily BMP  Macrocytic anemia Appears chronic. Hgb 8.8 on admission, now 8.0. Given CAD, transfusion threshold <8. -Monitor  Hgb closely with daily CBC  Chronic systolic heart failure ([place]) Last TTE 06/2021 consistent with HFmrEF with EF 45 to 85%, grade 2 diastolic dysfunction. Likely hypervolemic secondary to medication nonadherence. Echo today without significant changes from previous. - IV lasix 40 mg BID per cardiology, s/p one dose IV lasix 40 and one dose 80 - strict I/O, daily weights   FEN/GI: Heart healthy PPx: Lovenox Dispo:Pending PT recommendations   Subjective:  Doing well this morning. Feels like she is breathing okay. Still has some pain from the wounds in her L leg.   Objective: Temp:  [98.5 F (36.9 C)-100.5 F (38.1 C)] 98.7 F (37.1 C) (10/13 0408) Pulse Rate:  [63-80] 66 (10/13 0800) Resp:  [18-29] 20 (10/13 0800) BP: (110-156)/(40-96) 110/40 (10/13 0800) SpO2:  [90 %-100 %] 99 % (10/13 0800) Weight:  [111.1 kg-115 kg] 113.4 kg (10/13 0959) Physical Exam: General: Alert and oriented, in NAD Skin: Warm, dry; wrapped chronic lymphedema wounds on BLE HEENT: NCAT, EOM grossly normal, midline nasal septum Cardiac: RRR, 2/6 systolic murmur appreciated Respiratory: Expiratory wheezes in lower posterior lung fields, breathing and speaking comfortably on 2L [place] Extremities: Marked lymphedema of BLE Neurological: No gross focal deficit Psychiatric: Appropriate mood and affect   Laboratory: Most recent CBC Lab Results  Component Value Date   WBC 15.9 (H) 05/22/2022   HGB 8.0 (L) 05/22/2022   HCT 24.2 (L) 05/22/2022   MCV 98.4 05/22/2022   PLT 132 (L) 05/22/2022   Most recent BMP    Latest Ref Rng & Units 05/22/2022    5:30 AM  BMP  Glucose 70 - 99 mg/dL 119   BUN 8 - 23 mg/dL 15   Creatinine 0.44 - 1.00 mg/dL 1.30   Sodium 135 -  145 mmol/L 136   Potassium 3.5 - 5.1 mmol/L 3.7   Chloride 98 - 111 mmol/L 106   CO2 22 - 32 mmol/L 23   Calcium 8.9 - 10.3 mg/dL 7.6    Mg 2.1  Ethelene Hal, MD 05/22/2022, 11:04 AM PGY-1, [place] Intern pager:  [phone], text pages welcome Secure chat group [place]

## 2022-05-22 NOTE — TOC Initial Note (Signed)
Transition of Care [facility]) - Initial/Assessment Note    Patient Details  Name: Lisa [name] MRN: [id number] Date of Birth: Jul 26, 1941  [facility]) CM/SW Contact:    Verdell Carmine, RN Phone Number: 05/22/2022, 11:13 AM  Clinical Narrative:                  Ms paule is a 81 year old patient takes oral chemo, presented for feeling poorly and ICD fire a couple of days prior.  Interrogation revealed firing, at the time she "felt too bad" to come in >she has missed several doses of diuretic and has increased edema.  Dx Pneumonia, VFIB VT. Lives at home. PT and OT evaluation are pending. CM will follow for needs, recommendations, and transitions of care   Barriers to Discharge: Continued Medical Work up   Patient Goals and CMS Choice        Expected Discharge Plan and Services     Discharge Planning Services: CM Consult   Living arrangements for the past 2 months: [place]                                      Prior Living Arrangements/Services Living arrangements for the past 2 months: Single Family Home   Patient language and need for interpreter reviewed:: Yes        Need for Family Participation in Patient Care: Yes (Comment) Care giver support system in place?: Yes (comment)   Criminal Activity/Legal Involvement Pertinent to Current Situation/Hospitalization: No - Comment as needed  Activities of Daily Living Home Assistive Devices/Equipment: Walker (specify type) ADL Screening (condition at time of admission) Patient's cognitive ability adequate to safely complete daily activities?: Yes Is the patient deaf or have difficulty hearing?: No Does the patient have difficulty seeing, even when wearing glasses/contacts?: No Does the patient have difficulty concentrating, remembering, or making decisions?: No Patient able to express need for assistance with ADLs?: Yes Does the patient have difficulty dressing or bathing?:  Yes Independently performs ADLs?: No Communication: Independent Dressing (OT): Needs assistance Is this a change from baseline?: Pre-admission baseline Grooming: Needs assistance Is this a change from baseline?: Pre-admission baseline Feeding: Independent Bathing: Needs assistance Is this a change from baseline?: Pre-admission baseline Toileting: Needs assistance Is this a change from baseline?: Pre-admission baseline In/Out Bed: Needs assistance Is this a change from baseline?: Pre-admission baseline Walks in Home: Needs assistance Is this a change from baseline?: Pre-admission baseline Does the patient have difficulty walking or climbing stairs?: Yes Weakness of Legs: Both Weakness of Arms/Hands: None  Permission Sought/Granted                  Emotional Assessment       Orientation: : Oriented to Self, Oriented to Place Alcohol / Substance Use: Not Applicable Psych Involvement: No (comment)  Admission diagnosis:  Ventricular fibrillation ([place]) [I49.01] Ventricular tachycardia ([place]) [I47.20] Encounter for testing following appropriate discharge of implantable cardioverter-defibrillator [Z45.02] Sepsis due to pneumonia ([place]) [J18.9, A41.9] Community acquired pneumonia, unspecified laterality [J18.9] Patient Active Problem List   Diagnosis Date Noted   Macrocytic anemia 05/22/2022   Stage 3b chronic kidney disease (CKD) ([place]) 05/22/2022   Sepsis due to pneumonia ([place]) 05/21/2022   CML (chronic myelocytic leukemia) ([place]) 05/21/2022   Sustained VT (ventricular tachycardia) ([place]) 09/21/2019   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   Acute bronchitis 11/02/2016  Unstable angina ([place]) 05/12/4960   Chronic systolic heart failure (HCC)    PVC's (premature ventricular contractions)    Ventricular fibrillation ([place]) 10/03/2015   Cardiac arrest ([place]) 10/03/2015   Essential hypertension 09/09/2015   Lymphedema 02/21/2015   Other specified  hypothyroidism 02/21/2015   PCP:  Patient, No Pcp Per Pharmacy:   [facility] [id number], [place] [address] [place] [address] Phone: [phone] Fax: [phone]  [id number]Phone: [phone]: [phone]    Social Determinants of Health (SDOH) Interventions Housing Interventions: Intervention Not Indicated  Readmission Risk Interventions     No data to display

## 2022-05-22 NOTE — Progress Notes (Addendum)
Electrophysiology Rounding Note  Patient Name: Lisa [name] Date of Encounter: 05/22/2022  Primary Cardiologist: Lisa Carnes, MD Electrophysiologist: Lisa [name], MD   Subjective   NAEON. Has minimal appetite, states she hasn't had much of an appetite for about a week.   Inpatient Medications    Scheduled Meds:  allopurinol  100 mg Oral Daily   aspirin EC  81 mg Oral QHS   atorvastatin  20 mg Oral Daily   azithromycin  500 mg Oral Daily   enoxaparin (LOVENOX) injection  40 mg Subcutaneous Q24H   furosemide  40 mg Intravenous BID   imatinib  400 mg Oral Q supper   mexiletine  250 mg Oral Q12H   multivitamin with minerals  1 tablet Oral Daily   thyroid  15 mg Oral QAC breakfast   Continuous Infusions:  amiodarone 30 mg/hr (05/22/22 0115)   cefTRIAXone (ROCEPHIN)  IV     PRN Meds: acetaminophen **OR** acetaminophen, guaiFENesin   Vital Signs    Vitals:   05/22/22 0300 05/22/22 0400 05/22/22 0408 05/22/22 0500  BP: (!) 124/47  (!) 148/50   Pulse: 64  68   Resp: (!) 27 (!) 27 18   Temp:   98.7 F (37.1 C)   TempSrc:   Oral   SpO2: 98%  98%   Weight:    115 kg  Height:        Intake/Output Summary (Last 24 hours) at 05/22/2022 0813 Last data filed at 05/22/2022 0600 Gross per 24 hour  Intake 595.45 ml  Output --  Net 595.45 ml   Filed Weights   05/21/22 1352 05/22/22 0500  Weight: 111.1 kg 115 kg    Physical Exam    GEN- The patient is chronically ill-appearing, alert and oriented x 3 today.   HEET - benign, glasses in place Neck - JVD to mid-neck at 30 degrees Lungs- Clearer than prior, still with some crackles on R. No wheezing, normal work of breathing, [place] in place  Heart- Regular rate and rhythm, no murmurs, rubs or gallops GI- soft, NT, obese, + BS Extremities- unaboots b/l, marked edema. LLE erythema to knee,  Skin- no rash or lesion Psych- euthymic mood, full affect Neuro- strength and sensation are intact  Labs    CBC Recent  Labs    05/21/22 1401 05/22/22 0530  WBC 17.4* 15.9*  NEUTROABS 15.6*  --   HGB 8.8* 8.0*  HCT 27.3* 24.2*  MCV 100.4* 98.4  PLT 147* 875*   Basic Metabolic Panel Recent Labs    05/21/22 1401 05/22/22 0530  NA 139 136  K 3.1* 3.7  CL 107 106  CO2 23 23  GLUCOSE 138* 119*  BUN 10 15  CREATININE 1.11* 1.30*  CALCIUM 7.9* 7.6*  MG 1.6* 2.1   Liver Function Tests Recent Labs    05/21/22 1401  AST 37  ALT 29  ALKPHOS 66  BILITOT 1.4*  PROT 5.9*  ALBUMIN 2.7*   No results for input(s): "LIPASE", "AMYLASE" in the last 72 hours. Cardiac Enzymes No results for input(s): "CKTOTAL", "CKMB", "CKMBINDEX", "TROPONINI" in the last 72 hours.   Telemetry    SR (personally reviewed)  Radiology    DG Chest Port 1 View  Result Date: 05/21/2022 CLINICAL DATA:  Shortness of breath. EXAM: PORTABLE CHEST 1 VIEW COMPARISON:  CT 11/16/2020.  Radiographs 11/16/2020 and 09/21/2019. FINDINGS: 1358 hours. The left subclavian AICD/pacemaker leads appear unchanged. Stable mild cardiac enlargement and aortic atherosclerosis. There is chronic vascular  congestion with a new left pleural effusion and increased left greater than right basilar pulmonary opacities. No pneumothorax. The bones appear unchanged. Telemetry leads overlie the chest. IMPRESSION: New left pleural effusion and increased left greater than right basilar pulmonary opacities which may reflect atelectasis or pneumonia. Chronic vascular congestion. Electronically Signed   By: Lisa [name] M.D.   On: 05/21/2022 14:10    Patient Profile     Lisa [name] is a 81 y.o. female with a past medical history significant for HTN, HLD, LBBB, CAD (DES to LAD 2017), chronic CHF (systolic), NICM, with recovery of her LVEF s/p CRT-D, CML (follow[facility] at [facility], being treated with Gleevac), chronic BLL wounds (managed at [facility] with "low freq u/s) being seen for evaluation of VF/ICD shocks.   At arrival to ED, she reported not feeling well  for about a week, chills, nausea and vomiting.  ICD remote from 05/21/22 Battery and lead measurements are good  9 treated episodes 13 NSVT episodes   05/17/22: VF > ATP during charge failed > success with one shock 05/18/22 VF ATP failed > spontaneous conversion, shock aborted 05/20/22: VF ATP failed > successful shock  05/21/22: (6) VF episodes, all ATPs failed, all shocks were successful   OptiVol is high/above threshold   Assessment & Plan    VF storm She has had in the past Known single vessel CAD 2017, though no progression of disease on LHC 2021 with her VF then Low suspicion for new ischemia,  Likely provoked by infection/illness, acute HF off her diuretic Has remained NSR on tele - Keep K>4, Mag >2  - replete K today with PO 37mq - continue amio gtt today - consider transition to PO tomorrow -  continue mexiletine - continue to hold coreg    CAD No anginal sounding symptoms EKG without ischemic changes   Acute/chronic CHF She missed some days of her torsemide, 4 days by report Massive LE edema (mostly chronic) though she can tell more then her usual baseline Crackles at the bases CXR with vascular congestion and ?pneumonia vs atelectasis -- '40mg'$  IV lasix BID today - strict Is and Os    Other medical conditions:    Fever Leukocytosis Hx of CML , though WBC have not been elevated for some time it seems LE wounds Wound RN consult ordered BC already ordered and ABX via ER MD 7.   Anemia Appears chronic 8   CML Follows with [facility]      For questions or updates, please contact [place] consult [email or web address] for contact info under Cardiology/STEMI.  Signed, Lisa Levers NP  05/22/2022, 8:13 AM

## 2022-05-22 NOTE — Consult Note (Addendum)
[facility] Nurse Consult Note: Reason for Consult:Patient admitted with complex medical conditions in addition to pneumonia. Has bilateral LE full thickness wounds in the presence of lymphedema. IS followed by the outpatient wound care center at  [facility]. Last seen on 05/18/22 at that center. Wound type:Venous insufficiency/lymphedema with bilateral LE full thickness wounds Pressure Injury POA: N/A Measurement:Per 10/3 visit with PT and Provider Right anteromedial LE: 5c x 7x,  (95% red, 5% yellow Right posterolateral LE: 2xm x 2cm (100% red) Left anteromedial LE: 8cmx 19cm (70% red, 30% yellow) Wound bed:As noted above Drainage (amount, consistency, odor) Moderate to large Periwound:intact, erythema Dressing procedure/placement/frequency:I will continue the POC implemented and overseen by Dr. Welton Flakes at the above mentioned site using wound contact layer  of silver hydrofiber (Aquacel Ag+ Advantage, [place] # [phone]) to the wounds applied after cleansing with soap and water and rinsing and drying. The dressings will be topped with dry gauze and covered with an ABD pad, secured by wrapping from just below toes to just below knees with Kerlix roll gauze/paper tape. This is to be topped with a 6-inch ACE bandage applied in aq similar manner.  Changes will be daily.  Pressure injury prevention measures such as turning and repositioning, floatation of heels and placement of a sacral silicone foam dressing are implemented.  Recommend patient follow up at the outpatient clinic at [facility] as previously directed post discharge.  [place] nursing team will not follow, but will remain available to this patient, the nursing and medical teams.  Please re-consult if needed.  Thank you for inviting [place] to participate in this patient's Plan of Care.  Maudie Flakes, MSN, RN, CNS, [place], Serita Grammes, [employer], [employer] phone:  [phone]

## 2022-05-22 NOTE — Progress Notes (Signed)
  Echocardiogram 2D Echocardiogram has been performed.  Lisa [name] M 05/22/2022, 9:24 AM

## 2022-05-23 DIAGNOSIS — A419 Sepsis, unspecified organism: Secondary | ICD-10-CM | POA: Diagnosis not present

## 2022-05-23 DIAGNOSIS — J189 Pneumonia, unspecified organism: Secondary | ICD-10-CM

## 2022-05-23 DIAGNOSIS — I4901 Ventricular fibrillation: Secondary | ICD-10-CM | POA: Diagnosis not present

## 2022-05-23 LAB — BASIC METABOLIC PANEL
Anion gap: 8 (ref 5–15)
BUN: 17 mg/dL (ref 8–23)
CO2: 24 mmol/L (ref 22–32)
Calcium: 7.7 mg/dL — ABNORMAL LOW (ref 8.9–10.3)
Chloride: 104 mmol/L (ref 98–111)
Creatinine, Ser: 1.46 mg/dL — ABNORMAL HIGH (ref 0.44–1.00)
GFR, Estimated: 36 mL/min — ABNORMAL LOW (ref >60)
Glucose, Bld: 108 mg/dL — ABNORMAL HIGH (ref 70–99)
Potassium: 3.7 mmol/L (ref 3.5–5.1)
Sodium: 136 mmol/L (ref 135–145)

## 2022-05-23 LAB — CBC
HCT: 22.8 % — ABNORMAL LOW (ref 36.0–46.0)
Hemoglobin: 7.4 g/dL — ABNORMAL LOW (ref 12.0–15.0)
MCH: 32 pg (ref 26.0–34.0)
MCHC: 32.5 g/dL (ref 30.0–36.0)
MCV: 98.7 fL (ref 80.0–100.0)
Platelets: 140 10*3/uL — ABNORMAL LOW (ref 150–400)
RBC: 2.31 MIL/uL — ABNORMAL LOW (ref 3.87–5.11)
RDW: 16.1 % — ABNORMAL HIGH (ref 11.5–15.5)
WBC: 14.7 10*3/uL — ABNORMAL HIGH (ref 4.0–10.5)
nRBC: 0 % (ref 0.0–0.2)

## 2022-05-23 LAB — ABO/RH: ABO/RH(D): B POS

## 2022-05-23 MED ORDER — AMIODARONE HCL 200 MG PO TABS
400.0000 mg | ORAL_TABLET | Freq: Every day | ORAL | Status: DC
  Administered 2022-05-28: 400 mg via ORAL
  Filled 2022-05-23: qty 2

## 2022-05-23 MED ORDER — CEFDINIR 300 MG PO CAPS
300.0000 mg | ORAL_CAPSULE | Freq: Two times a day (BID) | ORAL | Status: AC
  Administered 2022-05-23 – 2022-05-25 (×6): 300 mg via ORAL
  Filled 2022-05-23 (×6): qty 1

## 2022-05-23 MED ORDER — BOOST PLUS PO LIQD
237.0000 mL | Freq: Every day | ORAL | Status: DC
  Administered 2022-05-23 – 2022-05-27 (×5): 237 mL via ORAL
  Filled 2022-05-23 (×7): qty 237

## 2022-05-23 MED ORDER — AMIODARONE HCL 200 MG PO TABS
400.0000 mg | ORAL_TABLET | Freq: Two times a day (BID) | ORAL | Status: AC
  Administered 2022-05-23 – 2022-05-27 (×10): 400 mg via ORAL
  Filled 2022-05-23 (×10): qty 2

## 2022-05-23 MED ORDER — SODIUM CHLORIDE 0.9% IV SOLUTION: Freq: Once | INTRAVENOUS | Status: AC

## 2022-05-23 NOTE — Evaluation (Signed)
Occupational Therapy Evaluation Patient Details Name: Lisa [name] MRN: [id number] DOB: 12/03/1940 Today's Date: 05/23/2022   History of Present Illness 81 yo female presents to [facility] on 10/12 with dizziness, increased LE swelling from stopping "fluid pill" x1 week ago, and ICD firings. Workup for sepsis PNA, VF storm. 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   Clinical Impression   Pt at PLOF reported they live alone and needed modified techniques due to edema in BLE to complete ADLS with increase in time. Pt in this session was limited to feeling dizziness but no significant BP changes. Pt at this time required max for supine to sitting due to BLE edema and pain in LLE with HOB elevated. Pt completed sit to stand transfers with supervision to min guard but only able to remain standing for about 2 mins prior to needing to go into a sitting position. Pt currently with functional limitations due to the deficits listed below (see OT Problem List).  Pt will benefit from skilled OT to increase their safety and independence with ADL and functional mobility for ADL to facilitate discharge to venue listed below.        Recommendations for follow up therapy are one component of a multi-disciplinary discharge planning process, led by the attending physician.  Recommendations may be updated based on patient status, additional functional criteria and insurance authorization.   Follow Up Recommendations  Skilled nursing-short term rehab (<3 hours/day) (Pt reporting they do not want SNF but does not have help at home.)    Assistance Recommended at Discharge Frequent or constant Supervision/Assistance  Patient can return home with the following A little help with walking and/or transfers;A lot of help with bathing/dressing/bathroom;Assist for transportation    Functional Status Assessment  Patient has had a  recent decline in their functional status and demonstrates the ability to make significant improvements in function in a reasonable and predictable amount of time.  Equipment Recommendations   (4WW vs FW)    Recommendations for Other Services       Precautions / Restrictions Precautions Precautions: Fall, watch o2 Restrictions Weight Bearing Restrictions: No      Mobility Bed Mobility Overal bed mobility: Needs Assistance Bed Mobility: Supine to Sit     Supine to sit: Max assist, HOB elevated     General bed mobility comments: Pt due to LLE pain, edema and endurance limiting pt at this time. Pt reports that they normally use sheets to sit at EOB    Transfers Overall transfer level: Needs assistance Equipment used: Rolling walker (2 wheels) Transfers: Sit to/from Stand Sit to Stand: Min guard     Step pivot transfers: Min guard            Balance Overall balance assessment: Needs assistance Sitting-balance support: No upper extremity supported, Feet supported Sitting balance-Leahy Scale: Fair     Standing balance support: Bilateral upper extremity supported, During functional activity, No upper extremity supported Standing balance-Leahy Scale: Fair                             ADL either performed or assessed with clinical judgement   ADL Overall ADL's : Needs assistance/impaired Eating/Feeding: Set up;Sitting   Grooming: Wash/dry hands;Wash/dry face;Min guard;Standing Grooming Details (indicate cue type and reason): Pt was superivison but due to becming dizzy in session needed increase  in level of assist Upper Body Bathing: Set up;Sitting   Lower Body Bathing: Maximal assistance;Cueing for safety;Cueing for sequencing;Sitting/lateral leans   Upper Body Dressing : Set up;Sitting   Lower Body Dressing: Maximal assistance;Sit to/from stand   Toilet Transfer: Min guard;Cueing for safety;Cueing for sequencing;Rolling walker (2 wheels)    Toileting- Clothing Manipulation and Hygiene: Minimal assistance;Sit to/from stand       Functional mobility during ADLs: Min guard;Rolling walker (2 wheels)       Vision Baseline Vision/History: 1 Wears glasses       Perception     Praxis      Pertinent Vitals/Pain Pain Assessment Pain Assessment: Faces Faces Pain Scale: Hurts whole lot Pain Location: LLE Pain Descriptors / Indicators: Sore, Aching Pain Intervention(s): Limited activity within patient's tolerance, Monitored during session, Premedicated before session, Repositioned     Hand Dominance Right   Extremity/Trunk Assessment Upper Extremity Assessment Upper Extremity Assessment: RUE deficits/detail RUE Deficits / Details: deu to IV site pain limiting elbow flexion/extension   Lower Extremity Assessment Lower Extremity Assessment: Defer to PT evaluation   Cervical / Trunk Assessment Cervical / Trunk Assessment: Normal   Communication Communication Communication: No difficulties   Cognition Arousal/Alertness: Awake/alert Behavior During Therapy: WFL for tasks assessed/performed Overall Cognitive Status: Within Functional Limits for tasks assessed                                       General Comments  Pt on RA 90-92% then with activity went down to 86% and attempted to recover with no O2 but started to become dizzy and o2 reading would not return above 86% while seated.    Exercises     Shoulder Instructions      Home Living Family/patient expects to be discharged to:: Private residence Living Arrangements: Alone Available Help at Discharge: Family;Available PRN/intermittently Type of Home: House Home Access: Stairs to enter [employer] of Steps: 3 Entrance Stairs-Rails: None Home Layout: Two level;Able to live on main level with bedroom/bathroom     Bathroom Shower/Tub: Occupational psychologist: Standard     Home Equipment: Cane - single point;Shower  seat          Prior Functioning/Environment Prior Level of Function : Independent/Modified Independent             Mobility Comments: uses cane in community. Has to use pillow cases to lift legs in/out of bed, in/out of car. Pt now cannot drive again for 6 months since ICD firings          OT Problem List: Decreased strength;Decreased activity tolerance;Impaired balance (sitting and/or standing);Decreased knowledge of use of DME or AE;Cardiopulmonary status limiting activity      OT Treatment/Interventions: Self-care/ADL training;DME and/or AE instruction;Therapeutic activities;Patient/family education;Balance training    OT Goals(Current goals can be found in the care plan section) Acute Rehab OT Goals Patient Stated Goal: to go home OT Goal Formulation: With patient Time For Goal Achievement: 06/06/22 Potential to Achieve Goals: Good  OT Frequency: Min 2X/week    Co-evaluation              AM-PAC OT "6 Clicks" Daily Activity     Outcome Measure Help from another person eating meals?: None Help from another person taking care of personal grooming?: A Little Help from another person toileting, which includes using toliet, bedpan, or urinal?: A Little Help from another  person bathing (including washing, rinsing, drying)?: A Lot Help from another person to put on and taking off regular upper body clothing?: A Little Help from another person to put on and taking off regular lower body clothing?: A Lot 6 Click Score: 17   End of Session Equipment Utilized During Treatment: Gait belt;Rolling walker (2 wheels);Oxygen Nurse Communication: Mobility status (BP, o2)  Activity Tolerance: Other (comment) (dizziness) Patient left: in chair;with call bell/phone within reach;with nursing/sitter in room  OT Visit Diagnosis: Unsteadiness on feet (R26.81);Other abnormalities of gait and mobility (R26.89);Repeated falls (R29.6);Muscle weakness (generalized) (M62.81)                 Time: 6606-0045 OT Time Calculation (min): 44 min Charges:  OT General Charges $OT Visit: 1 Visit OT Evaluation $OT Eval Low Complexity: 1 Low OT Treatments $Self Care/Home Management : 23-37 mins  Joeseph Amor OTR/L  Acute Rehab Services  [phone] office number [phone] pager number   Joeseph Amor 05/23/2022, 12:28 PM

## 2022-05-23 NOTE — Progress Notes (Signed)
Patient complaining of increased pain in left lower extremity.   Patient has chronic wounds and lymphedema but states this pain is not her baseline.   Patient's daughter at bedside and stated that in the emergency department the cardiologist had stated that the legs could be an additional source of infection and that the [facility] consult was placed to assess the legs and determine if there is a concern for infection vs just continuing plan of care.  Per [place] note, unclear if [place] RN assessed patient at bedside or just placed orders based on outpatient care.   Paged and spoke with Family Medicine MD.   MD advised will assess patient's legs during morning rounds on 05/23/2022 but current antibiotics would also cover leg wounds if this is an additional source of infection.  Patient and patient's daughter updated on plan of care.

## 2022-05-23 NOTE — Progress Notes (Signed)
Rounding Note    Patient Name: Lisa [name] Date of Encounter: 05/23/2022  [place] Cardiologist: [name], MD   Subjective   NAEO. No VT/VF. More anemia this AM, primary team plans for PRBC transfusion.  Inpatient Medications    Scheduled Meds:  allopurinol  100 mg Oral Daily   aspirin EC  81 mg Oral QHS   atorvastatin  20 mg Oral Daily   azithromycin  500 mg Oral Daily   enoxaparin (LOVENOX) injection  40 mg Subcutaneous Q24H   furosemide  40 mg Intravenous BID   imatinib  400 mg Oral Q supper   mexiletine  250 mg Oral Q12H   multivitamin with minerals  1 tablet Oral Daily   thyroid  15 mg Oral QAC breakfast   Continuous Infusions:  amiodarone 30 mg/hr (05/22/22 2121)   cefTRIAXone (ROCEPHIN)  IV 1 g (05/22/22 1717)   PRN Meds: acetaminophen **OR** acetaminophen, guaiFENesin   Vital Signs    Vitals:   05/22/22 2007 05/22/22 2325 05/23/22 0327 05/23/22 0500  BP: (!) 111/46 (!) 107/54 (!) 106/40   Pulse: 77 72 76   Resp: '20 20 20   '$ Temp: 98.7 F (37.1 C) 98 F (36.7 C) 98.7 F (37.1 C)   TempSrc: Oral Oral Oral   SpO2: 94% 91% 95%   Weight:    113.2 kg  Height:        Intake/Output Summary (Last 24 hours) at 05/23/2022 0815 Last data filed at 05/23/2022 0327 Gross per 24 hour  Intake 440 ml  Output 2150 ml  Net -1710 ml      05/23/2022    5:00 AM 05/22/2022    9:59 AM 05/22/2022    5:00 AM  Last 3 Weights  Weight (lbs) 249 lb 9 oz 250 lb 253 lb 8.5 oz  Weight (kg) 113.2 kg 113.4 kg 115 kg      Telemetry     Personally Reviewed  ECG     Personally Reviewed  Physical Exam   GEN: No acute distress.  Elderly. Obese.  Neck: No JVD Cardiac: RRR, no murmurs, rubs, or gallops.  Respiratory: Clear to auscultation bilaterally. GI: Soft, nontender, non-distended  MS: significant chronic LE edema; No deformity. Neuro:  Nonfocal  Psych: Normal affect   Labs    High Sensitivity Troponin:  No results for input(s):  "TROPONINIHS" in the last 720 hours.   Chemistry Recent Labs  Lab 05/21/22 1401 05/22/22 0530 05/23/22 0425  NA 139 136 136  K 3.1* 3.7 3.7  CL 107 106 104  CO2 '23 23 24  '$ GLUCOSE 138* 119* 108*  BUN '10 15 17  '$ CREATININE 1.11* 1.30* 1.46*  CALCIUM 7.9* 7.6* 7.7*  MG 1.6* 2.1  --   PROT 5.9*  --   --   ALBUMIN 2.7*  --   --   AST 37  --   --   ALT 29  --   --   ALKPHOS 66  --   --   BILITOT 1.4*  --   --   GFRNONAA 50* 42* 36*  ANIONGAP '9 7 8    '$ Lipids No results for input(s): "CHOL", "TRIG", "HDL", "LABVLDL", "LDLCALC", "CHOLHDL" in the last 168 hours.  Hematology Recent Labs  Lab 05/21/22 1401 05/22/22 0530 05/23/22 0425  WBC 17.4* 15.9* 14.7*  RBC 2.72* 2.46* 2.31*  HGB 8.8* 8.0* 7.4*  HCT 27.3* 24.2* 22.8*  MCV 100.4* 98.4 98.7  MCH 32.4 32.5 32.0  MCHC  32.2 33.1 32.5  RDW 16.3* 16.2* 16.1*  PLT 147* 132* 140*   Thyroid  Recent Labs  Lab 05/21/22 1401  TSH 1.142    BNPNo results for input(s): "BNP", "PROBNP" in the last 168 hours.  DDimer No results for input(s): "DDIMER" in the last 168 hours.   Radiology    ECHOCARDIOGRAM COMPLETE  Result Date: 05/22/2022    ECHOCARDIOGRAM REPORT   Patient Name:   Lisa [name] Date of Exam: 05/22/2022 Medical Rec #:  [id number]          Height:       62.0 in Accession #:    [id number]         Weight:       253.5 lb Date of Birth:  [birth year]           BSA:          2.114 m Patient Age:    16 years           BP:           110/40 mmHg Patient Gender: F                  HR:           68 bpm. Exam Location:  Inpatient Procedure: 2D Echo, Cardiac Doppler and Color Doppler Indications:    Congestive Heart Failure I50.9  History:        Patient has prior history of Echocardiogram examinations, most                 recent 06/18/2021. Cardiomyopathy and CHF, CAD, Defibrillator,                 Arrythmias:LBBB; Risk Factors:Hypertension and Dyslipidemia.                 Chronic Anemia. Pneumonia.  Sonographer:    Darlina Sicilian  RDCS Referring Phys: [id number] [place]  1. Left ventricular ejection fraction, by estimation, is 45 to 50%. The left ventricle has mildly decreased function. The left ventricle demonstrates global hypokinesis. The left ventricular internal cavity size was mildly dilated. Left ventricular diastolic parameters are consistent with Grade II diastolic dysfunction (pseudonormalization).  2. Right ventricular systolic function is normal. The right ventricular size is normal. There is normal pulmonary artery systolic pressure.  3. Left atrial size was moderately dilated.  4. The mitral valve is normal in structure. No evidence of mitral valve regurgitation.  5. The aortic valve was not well visualized. There is mild calcification of the aortic valve. Aortic valve regurgitation is not visualized.  6. The inferior vena cava is normal in size with <50% respiratory variability, suggesting right atrial pressure of 8 mmHg. Comparison(s): No significant change from prior study. FINDINGS  Left Ventricle: Left ventricular ejection fraction, by estimation, is 45 to 50%. The left ventricle has mildly decreased function. The left ventricle demonstrates global hypokinesis. The left ventricular internal cavity size was mildly dilated. There is  borderline left ventricular hypertrophy. Abnormal (paradoxical) septal motion, consistent with RV pacemaker. Left ventricular diastolic parameters are consistent with Grade II diastolic dysfunction (pseudonormalization). Right Ventricle: The right ventricular size is normal. Right ventricular systolic function is normal. There is normal pulmonary artery systolic pressure. The tricuspid regurgitant velocity is 2.01 m/s, and with an assumed right atrial pressure of 15 mmHg, the estimated right ventricular systolic pressure is 64.4 mmHg. Left Atrium: Left atrial size was moderately dilated. Right Atrium: Right atrial size was normal  in size. Pericardium: There is no evidence of  pericardial effusion. Presence of epicardial fat layer. Mitral Valve: The mitral valve is normal in structure. No evidence of mitral valve regurgitation. Tricuspid Valve: Tricuspid valve regurgitation is trivial. Aortic Valve: The aortic valve was not well visualized. There is mild calcification of the aortic valve. Aortic valve regurgitation is not visualized. Aortic valve mean gradient measures 10.0 mmHg. Aortic valve peak gradient measures 16.0 mmHg. Aortic valve area, by VTI measures 2.03 cm. Pulmonic Valve: The pulmonic valve was not well visualized. Pulmonic valve regurgitation is not visualized. Aorta: The aortic root and ascending aorta are structurally normal, with no evidence of dilitation. Venous: The inferior vena cava is normal in size with less than 50% respiratory variability, suggesting right atrial pressure of 8 mmHg. IAS/Shunts: No atrial level shunt detected by color flow Doppler. Additional Comments: A device lead is visualized.  LEFT VENTRICLE PLAX 2D LVIDd:         6.00 cm      Diastology LVIDs:         5.00 cm      LV e' medial:    7.67 cm/s LV PW:         0.70 cm      LV E/e' medial:  13.2 LV IVS:        0.70 cm      LV e' lateral:   7.27 cm/s LVOT diam:     1.90 cm      LV E/e' lateral: 13.9 LV SV:         87 LV SV Index:   41 LVOT Area:     2.84 cm  LV Volumes (MOD) LV vol d, MOD A2C: 127.0 ml LV vol d, MOD A4C: 124.0 ml LV vol s, MOD A2C: 54.5 ml LV vol s, MOD A4C: 76.5 ml LV SV MOD A2C:     72.5 ml LV SV MOD A4C:     124.0 ml LV SV MOD BP:      61.4 ml RIGHT VENTRICLE RV Basal diam:  4.20 cm RV Mid diam:    2.50 cm RV S prime:     17.50 cm/s TAPSE (M-mode): 2.4 cm LEFT ATRIUM             Index        RIGHT ATRIUM           Index LA diam:        4.10 cm 1.94 cm/m   RA Area:     18.50 cm LA Vol (A2C):   90.1 ml 42.61 ml/m  RA Volume:   46.90 ml  22.18 ml/m LA Vol (A4C):   80.2 ml 37.93 ml/m LA Biplane Vol: 87.0 ml 41.15 ml/m  AORTIC VALVE AV Area (Vmax):    2.04 cm AV Area (Vmean):    2.02 cm AV Area (VTI):     2.03 cm AV Vmax:           200.00 cm/s AV Vmean:          147.500 cm/s AV VTI:            0.430 m AV Peak Grad:      16.0 mmHg AV Mean Grad:      10.0 mmHg LVOT Vmax:         144.00 cm/s LVOT Vmean:        105.000 cm/s LVOT VTI:          0.308 m LVOT/AV VTI ratio: 0.72  AORTA Ao Root diam: 2.70 cm Ao Asc diam:  2.70 cm MITRAL VALVE                TRICUSPID VALVE MV Area (PHT): 3.36 cm     TR Peak grad:   16.2 mmHg MV Decel Time: 226 msec     TR Vmax:        201.00 cm/s MV E velocity: 101.00 cm/s MV A velocity: 57.50 cm/s   SHUNTS MV E/A ratio:  1.76         Systemic VTI:  0.31 m                             Systemic Diam: 1.90 cm Phineas Inches Electronically signed by Phineas Inches Signature Date/Time: 05/22/2022/9:46:23 AM    Final    DG Chest Port 1 View  Result Date: 05/21/2022 CLINICAL DATA:  Shortness of breath. EXAM: PORTABLE CHEST 1 VIEW COMPARISON:  CT 11/16/2020.  Radiographs 11/16/2020 and 09/21/2019. FINDINGS: 1358 hours. The left subclavian AICD/pacemaker leads appear unchanged. Stable mild cardiac enlargement and aortic atherosclerosis. There is chronic vascular congestion with a new left pleural effusion and increased left greater than right basilar pulmonary opacities. No pneumothorax. The bones appear unchanged. Telemetry leads overlie the chest. IMPRESSION: New left pleural effusion and increased left greater than right basilar pulmonary opacities which may reflect atelectasis or pneumonia. Chronic vascular congestion. Electronically Signed   By: Richardean Sale M.D.   On: 05/21/2022 14:10      Assessment & Plan    Lisa [name] is a 81 y.o. female with a past medical history significant for HTN, HLD, LBBB, CAD (DES to LAD 2017), chronic CHF (systolic), NICM, with recovery of her LVEF s/p CRT-D, CML (follows at [facility], being treated with Gleevac), chronic BLL wounds (managed at [facility] with "low freq u/s) being seen for evaluation of VF/ICD shocks.    #VT/VF Improved. Stop amiodarone gtt today. Start amiodarone PO '400mg'$  twice daily for another 5 days then decrease to once daily Continue mexilitine Keep K>4, Mg>2   #Acute on chronic systolic HF Continue diuresis IV through today. Possibly transitioning to oral tomorrow.   For questions or updates, please contact [place] Please consult [email or web address] for contact info under        Signed, Vickie Epley, MD  05/23/2022, 8:15 AM

## 2022-05-23 NOTE — Progress Notes (Signed)
Patient raised a concern related to new ordered Amiodarone dose.  Patient stated she was on Amiodarone prior to admission at 100 mg once a day.  Patient stated the listed 200 mg/day dose of Amiodarone in prior to admission medication list is incorrect as she was only taking 100 mg/day.  Per patient, MD Lovena Le advised her she shouldn't take more than 100 mg of Amiodarone per day and that "bad things could happen" if she took more than 100 mg/day.   RN discussed with patient that she was admitted with ventricular fibrillation and that she has been on a continuous dose of Amiodarone via IV so now MD is transitioning to an oral dose and has ordered the higher dose due to patient's admitting diagnosis.     Sent a message to MD Quentin Ore to notify MD of patient's voiced concerns.  MD confirmed that higher dose is necessary for a temporary period of time due to patient experiencing ventricular fibrillation.

## 2022-05-23 NOTE — Progress Notes (Signed)
Daily Progress Note Intern Pager: [phone]  Patient name: Lisa [name] Medical record number: [id number] Date of birth: 11-25-1940 Age: 81 y.o. Gender: female  Primary Care Provider: Patient, No Pcp Per Consultants: Cardiology (EP) Code Status: Full  Pt Overview and Major Events to Date:  10/12-admitted  Assessment and Plan:  81 year old female who presented with multiple VF episodes with ICD shocks likely in the setting of pneumonia meeting sepsis criteria. 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  * Sepsis due to pneumonia ([place]) Improving, on minimal supplemental oxygen less than 1 L. - continue ceftriaxone, consider transition to oral antibiotics today - completes 3 days azithromycin today -Mucinex as needed -F/u Bcx - wean to RA as tolerated  Ventricular fibrillation (HCC) Stable, management per EP.  Appreciate recommendations.  Lymphedema Chronic  CML (chronic myelocytic leukemia) (HCC) Chronic, stable. - continue imatinib  Essential hypertension BP soft. -Holding carvedilol per cardiology -Continue IV lasix -Monitor BP  Stage 3b chronic kidney disease (CKD) ([place]) Mildly increased from baseline, will continue to monitor.  Baseline Cr possibly around 1.2-1.3. -Monitor while diuresing with daily BMP  Macrocytic anemia Hemoglobin 7.4 this morning, patient amenable for blood transfusion. - transfuse 1u pRBC, f/u post transfusion H/H - transfuse for Hgb < 8 (history of CAD)  Chronic systolic heart failure (HCC) HFmrEF - diuresis per cardiology - IV lasix 40 mg BID per cardiology, possible transition to oral agents tomorrow - strict I/O, daily weights      FEN/GI: Regular diet PPx: Enoxaparin Dispo:Home with home health  possibly in 1 to 2 days . Barriers include clinical improvement and currently requiring IV diuresis.   Subjective:  Overnight hemoglobin  noted to be 7.4.  Overnight physicians discussed blood transfusion, patient was hesitant at that time and wanted some time to think about blood transfusion.  This morning, patient feels overall better.  Still having some dyspnea but improving.  She is amenable to blood transfusion this morning, she wanted to discuss with the cardiologist first.  Objective: Temp:  [98 F (36.7 C)-98.7 F (37.1 C)] 98.2 F (36.8 C) (10/14 0820) Pulse Rate:  [72-77] 75 (10/14 0820) Resp:  [18-20] 20 (10/14 0820) BP: (96-129)/(40-65) 96/65 (10/14 0820) SpO2:  [87 %-97 %] 95 % (10/14 0820) Weight:  [113.2 kg] 113.2 kg (10/14 0500) Physical Exam: General: Alert, resting in bed comfortably, NAD Cardiovascular: RRR, 2/6 systolic murmur present Respiratory: Clear to auscultation anteriorly, diminished breath sounds limited by body habitus Abdomen: Soft, nontender Extremities: Warm and well perfused, 2+ pitting edema bilateral, Ace wrap on both extremities  Laboratory: Most recent CBC Lab Results  Component Value Date   WBC 14.7 (H) 05/23/2022   HGB 7.4 (L) 05/23/2022   HCT 22.8 (L) 05/23/2022   MCV 98.7 05/23/2022   PLT 140 (L) 05/23/2022   Most recent BMP    Latest Ref Rng & Units 05/23/2022    4:25 AM  BMP  Glucose 70 - 99 mg/dL 108   BUN 8 - 23 mg/dL 17   Creatinine 0.44 - 1.00 mg/dL 1.46   Sodium 135 - 145 mmol/L 136   Potassium 3.5 - 5.1 mmol/L 3.7   Chloride 98 - 111 mmol/L 104   CO2 22 - 32 mmol/L 24   Calcium 8.9 - 10.3 mg/dL 7.7     Imaging/Diagnostic Tests: No new imaging Zola Button, MD 05/23/2022, 11:39 AM  PGY-3, [place]  Cool Intern pager: [phone], text pages welcome Secure chat group [place]

## 2022-05-24 DIAGNOSIS — I5023 Acute on chronic systolic (congestive) heart failure: Secondary | ICD-10-CM | POA: Diagnosis not present

## 2022-05-24 DIAGNOSIS — I890 Lymphedema, not elsewhere classified: Secondary | ICD-10-CM | POA: Diagnosis not present

## 2022-05-24 DIAGNOSIS — C9210 Chronic myeloid leukemia, BCR/ABL-positive, not having achieved remission: Secondary | ICD-10-CM | POA: Diagnosis not present

## 2022-05-24 DIAGNOSIS — J189 Pneumonia, unspecified organism: Secondary | ICD-10-CM | POA: Diagnosis not present

## 2022-05-24 DIAGNOSIS — A419 Sepsis, unspecified organism: Secondary | ICD-10-CM | POA: Diagnosis not present

## 2022-05-24 LAB — BPAM RBC
Blood Product Expiration Date: 202310252359
ISSUE DATE / TIME: 202310141618
Unit Type and Rh: 7300

## 2022-05-24 LAB — BASIC METABOLIC PANEL
Anion gap: 6 (ref 5–15)
BUN: 18 mg/dL (ref 8–23)
CO2: 24 mmol/L (ref 22–32)
Calcium: 7.5 mg/dL — ABNORMAL LOW (ref 8.9–10.3)
Chloride: 104 mmol/L (ref 98–111)
Creatinine, Ser: 1.4 mg/dL — ABNORMAL HIGH (ref 0.44–1.00)
GFR, Estimated: 38 mL/min — ABNORMAL LOW (ref >60)
Glucose, Bld: 129 mg/dL — ABNORMAL HIGH (ref 70–99)
Potassium: 3.2 mmol/L — ABNORMAL LOW (ref 3.5–5.1)
Sodium: 134 mmol/L — ABNORMAL LOW (ref 135–145)

## 2022-05-24 LAB — CBC
HCT: 23.8 % — ABNORMAL LOW (ref 36.0–46.0)
HCT: 26.2 % — ABNORMAL LOW (ref 36.0–46.0)
Hemoglobin: 7.9 g/dL — ABNORMAL LOW (ref 12.0–15.0)
Hemoglobin: 8.8 g/dL — ABNORMAL LOW (ref 12.0–15.0)
MCH: 31.1 pg (ref 26.0–34.0)
MCH: 31.7 pg (ref 26.0–34.0)
MCHC: 33.2 g/dL (ref 30.0–36.0)
MCHC: 33.6 g/dL (ref 30.0–36.0)
MCV: 93.7 fL (ref 80.0–100.0)
MCV: 94.2 fL (ref 80.0–100.0)
Platelets: 142 10*3/uL — ABNORMAL LOW (ref 150–400)
Platelets: 150 10*3/uL (ref 150–400)
RBC: 2.54 MIL/uL — ABNORMAL LOW (ref 3.87–5.11)
RBC: 2.78 MIL/uL — ABNORMAL LOW (ref 3.87–5.11)
RDW: 17.2 % — ABNORMAL HIGH (ref 11.5–15.5)
RDW: 17.2 % — ABNORMAL HIGH (ref 11.5–15.5)
WBC: 12 10*3/uL — ABNORMAL HIGH (ref 4.0–10.5)
WBC: 14.3 10*3/uL — ABNORMAL HIGH (ref 4.0–10.5)
nRBC: 0 % (ref 0.0–0.2)
nRBC: 0 % (ref 0.0–0.2)

## 2022-05-24 LAB — TYPE AND SCREEN
ABO/RH(D): B POS
Antibody Screen: NEGATIVE
Unit division: 0

## 2022-05-24 LAB — HEMOGLOBIN AND HEMATOCRIT, BLOOD
HCT: 28.4 % — ABNORMAL LOW (ref 36.0–46.0)
Hemoglobin: 9.5 g/dL — ABNORMAL LOW (ref 12.0–15.0)

## 2022-05-24 LAB — PREPARE RBC (CROSSMATCH)

## 2022-05-24 MED ORDER — POTASSIUM CHLORIDE CRYS ER 20 MEQ PO TBCR
40.0000 meq | EXTENDED_RELEASE_TABLET | Freq: Two times a day (BID) | ORAL | Status: AC
  Administered 2022-05-24 (×2): 40 meq via ORAL
  Filled 2022-05-24 (×2): qty 2

## 2022-05-24 MED ORDER — POTASSIUM CHLORIDE CRYS ER 20 MEQ PO TBCR: 40.0000 meq | EXTENDED_RELEASE_TABLET | Freq: Once | ORAL | Status: DC

## 2022-05-24 NOTE — Progress Notes (Signed)
Rounding Note    Patient Name: Lisa [name] Date of Encounter: 05/24/2022  [place] Cardiologist: [name], MD   Subjective   NAEO. Feeling better this morning. No VT/VF.   Inpatient Medications    Scheduled Meds:  allopurinol  100 mg Oral Daily   amiodarone  400 mg Oral BID   Followed by   Derrill Memo ON 05/28/2022] amiodarone  400 mg Oral Daily   aspirin EC  81 mg Oral QHS   atorvastatin  20 mg Oral Daily   cefdinir  300 mg Oral Q12H   enoxaparin (LOVENOX) injection  40 mg Subcutaneous Q24H   furosemide  40 mg Intravenous BID   imatinib  400 mg Oral Q supper   lactose free nutrition  237 mL Oral Daily   mexiletine  250 mg Oral Q12H   multivitamin with minerals  1 tablet Oral Daily   potassium chloride  40 mEq Oral BID   thyroid  15 mg Oral QAC breakfast   Continuous Infusions:   PRN Meds: acetaminophen **OR** acetaminophen, guaiFENesin   Vital Signs    Vitals:   05/23/22 2339 05/24/22 0334 05/24/22 0442 05/24/22 0812  BP: 136/62 (!) 121/45  (!) 137/48  Pulse: 76 75  75  Resp: 18 19  (!) 25  Temp: 98.7 F (37.1 C) 97.8 F (36.6 C)  99.2 F (37.3 C)  TempSrc: Oral Oral  Oral  SpO2: 90% 94%  94%  Weight:   115.8 kg   Height:        Intake/Output Summary (Last 24 hours) at 05/24/2022 0911 Last data filed at 05/24/2022 0300 Gross per 24 hour  Intake 329.63 ml  Output 1500 ml  Net -1170.37 ml       05/24/2022    4:42 AM 05/23/2022    5:00 AM 05/22/2022    9:59 AM  Last 3 Weights  Weight (lbs) 255 lb 4.7 oz 249 lb 9 oz 250 lb  Weight (kg) 115.8 kg 113.2 kg 113.4 kg      Telemetry     Personally Reviewed  ECG     Personally Reviewed  Physical Exam   GEN: No acute distress.  Elderly. Obese.  Neck: No JVD Cardiac: RRR, no murmurs, rubs, or gallops.  Respiratory: Clear to auscultation bilaterally. GI: Soft, nontender, non-distended  MS: significant chronic LE edema; LE wraps removed this morning. Skin is  erythematous but no open/draining wounds that I see.   Neuro:  Nonfocal  Psych: Normal affect   Labs    High Sensitivity Troponin:  No results for input(s): "TROPONINIHS" in the last 720 hours.   Chemistry Recent Labs  Lab 05/21/22 1401 05/22/22 0530 05/23/22 0425 05/24/22 0422  NA 139 136 136 134*  K 3.1* 3.7 3.7 3.2*  CL 107 106 104 104  CO2 '23 23 24 24  '$ GLUCOSE 138* 119* 108* 129*  BUN '10 15 17 18  '$ CREATININE 1.11* 1.30* 1.46* 1.40*  CALCIUM 7.9* 7.6* 7.7* 7.5*  MG 1.6* 2.1  --   --   PROT 5.9*  --   --   --   ALBUMIN 2.7*  --   --   --   AST 37  --   --   --   ALT 29  --   --   --   ALKPHOS 66  --   --   --   BILITOT 1.4*  --   --   --   GFRNONAA 50* 42* 36*  38*  ANIONGAP '9 7 8 6     '$ Lipids No results for input(s): "CHOL", "TRIG", "HDL", "LABVLDL", "LDLCALC", "CHOLHDL" in the last 168 hours.  Hematology Recent Labs  Lab 05/23/22 0425 05/23/22 2356 05/24/22 0422  WBC 14.7* 14.3* 12.0*  RBC 2.31* 2.78* 2.54*  HGB 7.4* 8.8* 7.9*  HCT 22.8* 26.2* 23.8*  MCV 98.7 94.2 93.7  MCH 32.0 31.7 31.1  MCHC 32.5 33.6 33.2  RDW 16.1* 17.2* 17.2*  PLT 140* 150 142*    Thyroid  Recent Labs  Lab 05/21/22 1401  TSH 1.142     BNPNo results for input(s): "BNP", "PROBNP" in the last 168 hours.  DDimer No results for input(s): "DDIMER" in the last 168 hours.   Radiology    ECHOCARDIOGRAM COMPLETE  Result Date: 05/22/2022    ECHOCARDIOGRAM REPORT   Patient Name:   Lisa [name] Date of Exam: 05/22/2022 Medical Rec #:  [id number]          Height:       62.0 in Accession #:    [id number]         Weight:       253.5 lb Date of Birth:  [birth year]           BSA:          2.114 m Patient Age:    81 years           BP:           110/40 mmHg Patient Gender: F                  HR:           68 bpm. Exam Location:  Inpatient Procedure: 2D Echo, Cardiac Doppler and Color Doppler Indications:    Congestive Heart Failure I50.9  History:        Patient has prior history of  Echocardiogram examinations, most                 recent 06/18/2021. Cardiomyopathy and CHF, CAD, Defibrillator,                 Arrythmias:LBBB; Risk Factors:Hypertension and Dyslipidemia.                 Chronic Anemia. Pneumonia.  Sonographer:    Darlina Sicilian RDCS Referring Phys: [id number] [place]  1. Left ventricular ejection fraction, by estimation, is 45 to 50%. The left ventricle has mildly decreased function. The left ventricle demonstrates global hypokinesis. The left ventricular internal cavity size was mildly dilated. Left ventricular diastolic parameters are consistent with Grade II diastolic dysfunction (pseudonormalization).  2. Right ventricular systolic function is normal. The right ventricular size is normal. There is normal pulmonary artery systolic pressure.  3. Left atrial size was moderately dilated.  4. The mitral valve is normal in structure. No evidence of mitral valve regurgitation.  5. The aortic valve was not well visualized. There is mild calcification of the aortic valve. Aortic valve regurgitation is not visualized.  6. The inferior vena cava is normal in size with <50% respiratory variability, suggesting right atrial pressure of 8 mmHg. Comparison(s): No significant change from prior study. FINDINGS  Left Ventricle: Left ventricular ejection fraction, by estimation, is 45 to 50%. The left ventricle has mildly decreased function. The left ventricle demonstrates global hypokinesis. The left ventricular internal cavity size was mildly dilated. There is  borderline left ventricular hypertrophy. Abnormal (paradoxical) septal motion, consistent with RV pacemaker. Left  ventricular diastolic parameters are consistent with Grade II diastolic dysfunction (pseudonormalization). Right Ventricle: The right ventricular size is normal. Right ventricular systolic function is normal. There is normal pulmonary artery systolic pressure. The tricuspid regurgitant velocity is 2.01 m/s, and  with an assumed right atrial pressure of 15 mmHg, the estimated right ventricular systolic pressure is 59.9 mmHg. Left Atrium: Left atrial size was moderately dilated. Right Atrium: Right atrial size was normal in size. Pericardium: There is no evidence of pericardial effusion. Presence of epicardial fat layer. Mitral Valve: The mitral valve is normal in structure. No evidence of mitral valve regurgitation. Tricuspid Valve: Tricuspid valve regurgitation is trivial. Aortic Valve: The aortic valve was not well visualized. There is mild calcification of the aortic valve. Aortic valve regurgitation is not visualized. Aortic valve mean gradient measures 10.0 mmHg. Aortic valve peak gradient measures 16.0 mmHg. Aortic valve area, by VTI measures 2.03 cm. Pulmonic Valve: The pulmonic valve was not well visualized. Pulmonic valve regurgitation is not visualized. Aorta: The aortic root and ascending aorta are structurally normal, with no evidence of dilitation. Venous: The inferior vena cava is normal in size with less than 50% respiratory variability, suggesting right atrial pressure of 8 mmHg. IAS/Shunts: No atrial level shunt detected by color flow Doppler. Additional Comments: A device lead is visualized.  LEFT VENTRICLE PLAX 2D LVIDd:         6.00 cm      Diastology LVIDs:         5.00 cm      LV e' medial:    7.67 cm/s LV PW:         0.70 cm      LV E/e' medial:  13.2 LV IVS:        0.70 cm      LV e' lateral:   7.27 cm/s LVOT diam:     1.90 cm      LV E/e' lateral: 13.9 LV SV:         87 LV SV Index:   41 LVOT Area:     2.84 cm  LV Volumes (MOD) LV vol d, MOD A2C: 127.0 ml LV vol d, MOD A4C: 124.0 ml LV vol s, MOD A2C: 54.5 ml LV vol s, MOD A4C: 76.5 ml LV SV MOD A2C:     72.5 ml LV SV MOD A4C:     124.0 ml LV SV MOD BP:      61.4 ml RIGHT VENTRICLE RV Basal diam:  4.20 cm RV Mid diam:    2.50 cm RV S prime:     17.50 cm/s TAPSE (M-mode): 2.4 cm LEFT ATRIUM             Index        RIGHT ATRIUM           Index LA  diam:        4.10 cm 1.94 cm/m   RA Area:     18.50 cm LA Vol (A2C):   90.1 ml 42.61 ml/m  RA Volume:   46.90 ml  22.18 ml/m LA Vol (A4C):   80.2 ml 37.93 ml/m LA Biplane Vol: 87.0 ml 41.15 ml/m  AORTIC VALVE AV Area (Vmax):    2.04 cm AV Area (Vmean):   2.02 cm AV Area (VTI):     2.03 cm AV Vmax:           200.00 cm/s AV Vmean:          147.500 cm/s AV VTI:  0.430 m AV Peak Grad:      16.0 mmHg AV Mean Grad:      10.0 mmHg LVOT Vmax:         144.00 cm/s LVOT Vmean:        105.000 cm/s LVOT VTI:          0.308 m LVOT/AV VTI ratio: 0.72  AORTA Ao Root diam: 2.70 cm Ao Asc diam:  2.70 cm MITRAL VALVE                TRICUSPID VALVE MV Area (PHT): 3.36 cm     TR Peak grad:   16.2 mmHg MV Decel Time: 226 msec     TR Vmax:        201.00 cm/s MV E velocity: 101.00 cm/s MV A velocity: 57.50 cm/s   SHUNTS MV E/A ratio:  1.76         Systemic VTI:  0.31 m                             Systemic Diam: 1.90 cm Phineas Inches Electronically signed by Phineas Inches Signature Date/Time: 05/22/2022/9:46:23 AM    Final       Assessment & Plan    Lisa [name] is a 81 y.o. 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   #VT/VF Improved. Amiodarone PO '400mg'$  twice daily for another 5 days then decrease to once daily Continue mexilitine Keep K>4, Mg>2   #Acute on chronic systolic HF Continue diuresis IV BID. She is still significantly volume overloaded.    For questions or updates, please contact [place] Please consult [email or web address] for contact info under        Signed, Vickie Epley, MD  05/24/2022, 9:11 AM

## 2022-05-24 NOTE — TOC Progression Note (Signed)
Transition of Care [facility]) - Initial/Assessment Note    Patient Details  Name: BASIA MCGINTY MRN: [id number] Date of Birth: [birth year]  Transition of Care [facility]) CM/SW Contact:    Milinda Antis, [place] Phone Number: 05/24/2022, 12:55 PM  Clinical Narrative:                 CSW notified by attending that patient is now agreeable to SNF. CSW spoke with patient.  The patient agrees to go to SNF as patient is from home alone with no one to assist.  The patient does not have a facility preference.  CSW explained the insurance process and patient will be provided with Medicare rating list.  Referrals will be sent out and bed offers given to patient when received.      Barriers to Discharge: Continued Medical Work up   Patient Goals and CMS Choice        Expected Discharge Plan and Services     Discharge Planning Services: CM Consult   Living arrangements for the past 2 months: [place]                                      Prior Living Arrangements/Services Living arrangements for the past 2 months: [place]   Patient language and need for interpreter reviewed:: Yes        Need for Family Participation in Patient Care: Yes (Comment) Care giver support system in place?: Yes (comment)   Criminal Activity/Legal Involvement Pertinent to Current Situation/Hospitalization: No - Comment as needed  Activities of Daily Living Home Assistive Devices/Equipment: Walker (specify type) ADL Screening (condition at time of admission) Patient's cognitive ability adequate to safely complete daily activities?: Yes Is the patient deaf or have difficulty hearing?: No Does the patient have difficulty seeing, even when wearing glasses/contacts?: No Does the patient have difficulty concentrating, remembering, or making decisions?: No Patient able to express need for assistance with ADLs?: Yes Does the patient have difficulty dressing or bathing?: Yes Independently  performs ADLs?: No Communication: Independent Dressing (OT): Needs assistance Is this a change from baseline?: Pre-admission baseline Grooming: Needs assistance Is this a change from baseline?: Pre-admission baseline Feeding: Independent Bathing: Needs assistance Is this a change from baseline?: Pre-admission baseline Toileting: Needs assistance Is this a change from baseline?: Pre-admission baseline In/Out Bed: Needs assistance Is this a change from baseline?: Pre-admission baseline Walks in Home: Needs assistance Is this a change from baseline?: Pre-admission baseline Does the patient have difficulty walking or climbing stairs?: Yes Weakness of Legs: Both Weakness of Arms/Hands: None  Permission Sought/Granted                  Emotional Assessment       Orientation: : Oriented to Self, Oriented to Place Alcohol / Substance Use: Not Applicable Psych Involvement: No (comment)  Admission diagnosis:  Ventricular fibrillation ([place]) [I49.01] Ventricular tachycardia ([place]) [I47.20] Encounter for testing following appropriate discharge of implantable cardioverter-defibrillator [Z45.02] Sepsis due to pneumonia ([place]) [J18.9, A41.9] Community acquired pneumonia, unspecified laterality [J18.9] Patient Active Problem List   Diagnosis Date Noted   Community acquired pneumonia    Macrocytic anemia 05/22/2022   Stage 3b chronic kidney disease (CKD) ([place]) 05/22/2022   Sepsis due to pneumonia ([place]) 05/21/2022   CML (chronic myelocytic leukemia) ([place]) 05/21/2022   Sustained VT (ventricular tachycardia) ([place]) 09/21/2019   ICD (implantable cardioverter-defibrillator) in place  06/14/2019   Acute bronchitis 11/02/2016   Unstable angina ([place]) 75/83/0746   Chronic systolic heart failure (HCC)    PVC's (premature ventricular contractions)    Ventricular fibrillation ([place]) 10/03/2015   Cardiac arrest ([place]) 10/03/2015   Essential hypertension 09/09/2015   Lymphedema 02/21/2015   Other  specified hypothyroidism 02/21/2015   PCP:  Patient, No Pcp Per Pharmacy:   [facility] # [id number], [place] [address] [place] [address] Phone: [phone] Fax: [phone]  [id number]: [phone]: [phone]    Social Determinants of Health (SDOH) Interventions Housing Interventions: Intervention Not Indicated  Readmission Risk Interventions     No data to display

## 2022-05-24 NOTE — Progress Notes (Signed)
Daily Progress Note Intern Pager: [phone]  Patient name: Lisa [name] Medical record number: [id number] Date of birth: 07/21/1941 Age: 81 y.o. Gender: female  Primary Care Provider: Patient, No Pcp Per Consultants: Cardiology (EP) Code Status: Full   Pt Overview and Major Events to Date:  10/12-admitted   Assessment and Plan:   81 year old female who presented with multiple VF episodes with ICD shocks likely in the setting of pneumonia meeting sepsis criteria. 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   * Sepsis due to pneumonia ([place]) Improving, on minimal supplemental oxygen less than 1 L. Bcx NGTD. - continue cefdinir (day 4/5) - completed azithro course - Mucinex as needed - F/u Bcx - wean to RA as tolerated  Ventricular fibrillation (HCC) Stable, management per EP.  Appreciate recommendations. -Continue PO amiodarone 400 mg BID for 5 days, then daily -Continue mexilitine -Replete lytes as necessary to keep K>4 and Mg>2  Lymphedema Chronic, stable. On IV diuresis per cardiology. -Continue diuresis -Consider unna boots for venous stasis  CML (chronic myelocytic leukemia) (HCC) Chronic, stable. - continue imatinib  Essential hypertension BP soft. -Holding carvedilol per cardiology -Continue diuresis -Monitor BP  Stage 3b chronic kidney disease (CKD) ([place]) Mildly increased from baseline, will continue to monitor.  Baseline Cr possibly around 1.2-1.3. -Monitor while diuresing with daily BMP  Macrocytic anemia Hemoglobin 7.4>8.8 after transfusion 10/14. Now 7.9 this morning 10/15. No obvious sources of bleeding on exam/history; consider dilutional effects/phlebotomy with increased IV medications and blood draws here in hospital. - recheck H&H - transfuse for Hgb < 8 (history of CAD)  Chronic systolic heart failure (HCC) HFmrEF - diuresis per cardiology - strict  I/O, daily weights   FEN/GI: Regular diet PPx: Enoxaparin Dispo:Home with home health PT possibly in 1 to 2 days. Barriers include clinical improvement.  Subjective:  Doing overall well this morning. SOB improved. Left leg still hurts, though both of her legs have improved greatly in pain since admission with diuresis. She is amenable to going to SNF should we feel like she needs it.  Objective: Temp:  [97.8 F (36.6 C)-99.2 F (37.3 C)] 99.2 F (37.3 C) (10/15 0812) Pulse Rate:  [70-79] 75 (10/15 0812) Resp:  [18-30] 25 (10/15 0812) BP: (121-137)/(45-62) 137/48 (10/15 0812) SpO2:  [90 %-95 %] 94 % (10/15 0812) Weight:  [115.8 kg] 115.8 kg (10/15 0442) Physical Exam: General: Alert and oriented, in NAD Skin: Warm, dry; venous stasis dermatitis overlying both LE HEENT: NCAT, EOM grossly normal, midline nasal septum Cardiac: RRR, 2/6 systolic murmur appreciated Respiratory: CTAB, breathing and speaking comfortably on RA Extremities: Marked lymphedema to BLE with dried serous fluid and venous stasis dermatitis Neurological: No gross focal deficit Psychiatric: Appropriate mood and affect   Laboratory: Most recent CBC Lab Results  Component Value Date   WBC 12.0 (H) 05/24/2022   HGB 7.9 (L) 05/24/2022   HCT 23.8 (L) 05/24/2022   MCV 93.7 05/24/2022   PLT 142 (L) 05/24/2022   Most recent BMP    Latest Ref Rng & Units 05/24/2022    4:22 AM  BMP  Glucose 70 - 99 mg/dL 129   BUN 8 - 23 mg/dL 18   Creatinine 0.44 - 1.00 mg/dL 1.40   Sodium 135 - 145 mmol/L 134   Potassium 3.5 - 5.1 mmol/L 3.2   Chloride 98 - 111 mmol/L 104   CO2 22 - 32 mmol/L 24  Calcium 8.9 - 10.3 mg/dL 7.5    Ethelene Hal, MD 05/24/2022, 1:14 PM PGY-1, [place] Intern pager: [phone], text pages welcome Secure chat group [place]

## 2022-05-24 NOTE — NC FL2 (Signed)
[place] LEVEL OF CARE SCREENING TOOL     IDENTIFICATION  Patient Name: Lisa [name] Birthdate: [birth year] Sex: female Admission Date (Current Location): 05/21/2022  [facility] and [place] Number:  Herbalist and Address:  [facility] [place] [facility]. [facility] [place] [address] [place], [place] [address]      Provider Number: [id number]  Attending Physician Name and Address:  McDiarmid, Blane Ohara, MD  Relative Name and Phone Number:  Marti Sleigh (Daughter)   [phone]    Current Level of Care: Hospital Recommended Level of Care: [place] Prior Approval Number:    Date Approved/Denied:   PASRR Number: [id number]  Discharge Plan: SNF    Current Diagnoses: Patient Active Problem List   Diagnosis Date Noted   Community acquired pneumonia    Macrocytic anemia 05/22/2022   Stage 3b chronic kidney disease (CKD) ([place]) 05/22/2022   Sepsis due to pneumonia ([place]) 05/21/2022   CML (chronic myelocytic leukemia) ([place]) 05/21/2022   Sustained VT (ventricular tachycardia) ([place]) 09/21/2019   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   Acute bronchitis 11/02/2016   Unstable angina ([place]) 55/73/2202   Chronic systolic heart failure ([place])    PVC's (premature ventricular contractions)    Ventricular fibrillation ([place]) 10/03/2015   Cardiac arrest ([place]) 10/03/2015   Essential hypertension 09/09/2015   Lymphedema 02/21/2015   Other specified hypothyroidism 02/21/2015    Orientation RESPIRATION BLADDER Height & Weight     Self, Time, Situation, Place  O2 (1L) Incontinent Weight: 255 lb 4.7 oz (115.8 kg) Height:  '5\' 2"'$  (157.5 cm)  BEHAVIORAL SYMPTOMS/MOOD NEUROLOGICAL BOWEL NUTRITION STATUS      Incontinent Diet (see discharge summary)  AMBULATORY STATUS COMMUNICATION OF NEEDS Skin   Extensive Assist Verbally Other (Comment) (Left Lymphedema; Right Lymphedema)                       Personal Care Assistance Level  of Assistance  Bathing, Dressing, Feeding Bathing Assistance: Limited assistance Feeding assistance: Limited assistance Dressing Assistance: Limited assistance     Functional Limitations Info  Sight, Hearing, Speech Sight Info: Impaired Hearing Info: Adequate Speech Info: Adequate    SPECIAL CARE FACTORS FREQUENCY  PT (By licensed PT), OT (By licensed OT)     PT Frequency: 5x/ week OT Frequency: 5x/ week            Contractures Contractures Info: Not present    Additional Factors Info  Code Status, Allergies Code Status Info: Full Allergies Info: Flagyl (Metronidazole)   Penicillins   Sulfamethoxazole   Meperidine   Morphine And Related   Propoxyphene   Tape   Doxycycline   Lanolin   Sulfa Antibiotics           Current Medications (05/24/2022):  This is the current hospital active medication list Current Facility-Administered Medications  Medication Dose Route Frequency Provider Last Rate Last Admin   acetaminophen (TYLENOL) tablet 650 mg  650 mg Oral Q6H PRN Zola Button, MD       Or   acetaminophen (TYLENOL) suppository 650 mg  650 mg Rectal Q6H PRN Zola Button, MD       allopurinol (ZYLOPRIM) tablet 100 mg  100 mg Oral Daily Zola Button, MD   100 mg at 05/24/22 0856   amiodarone (PACERONE) tablet 400 mg  400 mg Oral BID Vickie Epley, MD   400 mg at 05/24/22 0856   Followed by   Derrill Memo ON 05/28/2022] amiodarone (PACERONE) tablet 400  mg  400 mg Oral Daily Vickie Epley, MD       aspirin EC tablet 81 mg  81 mg Oral QHS Zola Button, MD   81 mg at 05/23/22 2149   atorvastatin (LIPITOR) tablet 20 mg  20 mg Oral Daily Zola Button, MD   20 mg at 05/24/22 0857   cefdinir (OMNICEF) capsule 300 mg  300 mg Oral Q12H Zola Button, MD   300 mg at 05/24/22 0856   enoxaparin (LOVENOX) injection 40 mg  40 mg Subcutaneous Q24H Zola Button, MD   40 mg at 05/23/22 1829   furosemide (LASIX) injection 40 mg  40 mg Intravenous BID Mamie Levers, NP   40 mg at 05/24/22  0857   guaiFENesin (MUCINEX) 12 hr tablet 600 mg  600 mg Oral BID PRN Jacelyn Grip, MD       imatinib (GLEEVEC) tablet 400 mg  400 mg Oral Q supper Zola Button, MD   400 mg at 05/23/22 1831   lactose free nutrition (BOOST PLUS) liquid 237 mL  237 mL Oral Daily Zola Button, MD   237 mL at 05/23/22 2149   mexiletine (MEXITIL) capsule 250 mg  250 mg Oral Q12H Zola Button, MD   250 mg at 05/24/22 1700   multivitamin with minerals tablet 1 tablet  1 tablet Oral Daily Zola Button, MD   1 tablet at 05/24/22 0857   potassium chloride SA (KLOR-CON M) CR tablet 40 mEq  40 mEq Oral BID Ezequiel Essex, MD   40 mEq at 05/24/22 0856   thyroid (ARMOUR) tablet 15 mg  15 mg Oral QAC breakfast Zola Button, MD   15 mg at 05/24/22 0545     Discharge Medications: Please see discharge summary for a list of discharge medications.  Relevant Imaging Results:  Relevant Lab Results:   Additional Information SSN:  [id number]; patient reports COVID vaccination X 3  Keland Peyton F Jeriah Corkum, LCSWA

## 2022-05-25 DIAGNOSIS — J189 Pneumonia, unspecified organism: Secondary | ICD-10-CM | POA: Diagnosis not present

## 2022-05-25 DIAGNOSIS — C9210 Chronic myeloid leukemia, BCR/ABL-positive, not having achieved remission: Secondary | ICD-10-CM | POA: Diagnosis not present

## 2022-05-25 DIAGNOSIS — I5023 Acute on chronic systolic (congestive) heart failure: Secondary | ICD-10-CM | POA: Diagnosis not present

## 2022-05-25 DIAGNOSIS — I5022 Chronic systolic (congestive) heart failure: Secondary | ICD-10-CM | POA: Diagnosis not present

## 2022-05-25 LAB — BASIC METABOLIC PANEL
Anion gap: 8 (ref 5–15)
BUN: 15 mg/dL (ref 8–23)
CO2: 26 mmol/L (ref 22–32)
Calcium: 7.7 mg/dL — ABNORMAL LOW (ref 8.9–10.3)
Chloride: 103 mmol/L (ref 98–111)
Creatinine, Ser: 1.3 mg/dL — ABNORMAL HIGH (ref 0.44–1.00)
GFR, Estimated: 42 mL/min — ABNORMAL LOW (ref >60)
Glucose, Bld: 107 mg/dL — ABNORMAL HIGH (ref 70–99)
Potassium: 3.5 mmol/L (ref 3.5–5.1)
Sodium: 137 mmol/L (ref 135–145)

## 2022-05-25 LAB — CBC
HCT: 26.2 % — ABNORMAL LOW (ref 36.0–46.0)
Hemoglobin: 8.5 g/dL — ABNORMAL LOW (ref 12.0–15.0)
MCH: 30.7 pg (ref 26.0–34.0)
MCHC: 32.4 g/dL (ref 30.0–36.0)
MCV: 94.6 fL (ref 80.0–100.0)
Platelets: 173 10*3/uL (ref 150–400)
RBC: 2.77 MIL/uL — ABNORMAL LOW (ref 3.87–5.11)
RDW: 16.6 % — ABNORMAL HIGH (ref 11.5–15.5)
WBC: 9.2 10*3/uL (ref 4.0–10.5)
nRBC: 0 % (ref 0.0–0.2)

## 2022-05-25 MED ORDER — ORAL CARE MOUTH RINSE: 15.0000 mL | OROMUCOSAL | Status: DC | PRN

## 2022-05-25 NOTE — TOC Progression Note (Addendum)
Transition of Care [facility]) - Progression Note    Patient Details  Name: Lisa [name] MRN: [id number] Date of Birth: 10-14-1940  Transition of Care [facility]) CM/SW [place], LCSW Phone Number: 05/25/2022, 4:00 PM  Clinical Narrative:    CSW met with patient and provided SNF bed offers with Medicare ratings. She asked about [place] facilities and CSW let her know that they are not on our [place] system so if there is one she wants she can let CSW know to reach out to them. She will consult with her daughter who lives in [place].   CSW spoke with [facility] Admissions and will securely email them the referral ([email or web address]).      Barriers to Discharge: Continued Medical Work up  Expected Discharge Plan and Services     Discharge Planning Services: CM Consult   Living arrangements for the past 2 months: Single Family Home                                       Social Determinants of Health (SDOH) Interventions Housing Interventions: Intervention Not Indicated  Readmission Risk Interventions     No data to display

## 2022-05-25 NOTE — Progress Notes (Addendum)
Physical Therapy Treatment Patient Details Name: Lisa [name] MRN: [id number] DOB: [birth year] Today's Date: 05/25/2022   History of Present Illness 81 yo female presents to [facility] on 10/12 with dizziness, increased LE swelling from stopping "fluid pill" x1 week ago, and ICD firings. Workup for sepsis PNA, VF storm. 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    PT Comments    Pt inquiring about therapy at SNF. Pt encouraged to have family/friends visit facility. Pt reports she thinks it would be a good idea for her to go to therapy. PT in agreement. Pt is requiring maxA for bed mobility, minA for transfers and modA for ambulation due to LoB. PT changed recommendation to SNF.   Recommendations for follow up therapy are one component of a multi-disciplinary discharge planning process, led by the attending physician.  Recommendations may be updated based on patient status, additional functional criteria and insurance authorization.  Follow Up Recommendations  Skilled nursing-short term rehab (<3 hours/day) Can patient physically be transported by private vehicle: No   Assistance Recommended at Discharge Frequent or constant Supervision/Assistance  Patient can return home with the following A little help with walking and/or transfers;A little help with bathing/dressing/bathroom;Help with stairs or ramp for entrance   Equipment Recommendations  None recommended by PT       Precautions / Restrictions Precautions Precautions: Fall Restrictions Weight Bearing Restrictions: No     Mobility  Bed Mobility Overal bed mobility: Needs Assistance Bed Mobility: Supine to Sit     Supine to sit: HOB elevated, Mod assist Sit to supine: Max assist   General bed mobility comments: pt able to adjust HoB up to decrease need for elevation of her trunk, pt requiring modA for moving LE across bed surface  once LE over EoB pt requires no further assist but increased effort to come to sitting on the EoB, pt requires maxA for management of LE back into bed    Transfers Overall transfer level: Needs assistance Equipment used: Rolling walker (2 wheels) Transfers: Sit to/from Stand Sit to Stand: Min assist           General transfer comment: multimodal cuing for hand placement for power up, min physical assist to bring CoG over BoS    Ambulation/Gait Ambulation/Gait assistance: Min assist, Mod assist Gait Distance (Feet): 20 Feet Assistive device: Rolling walker (2 wheels) Gait Pattern/deviations: Step-through pattern, Decreased step length - right, Decreased step length - left, Shuffle, Knee flexed in stance - right, Knee flexed in stance - left Gait velocity: slowed Gait velocity interpretation: <1.31 ft/sec, indicative of household ambulator   General Gait Details: min A for steadying with waddling gait, due to decreased knee flexion, pt with 1x LoB requiring mod A for steadying         Balance Overall balance assessment: Needs assistance Sitting-balance support: No upper extremity supported, Feet supported Sitting balance-Leahy Scale: Fair     Standing balance support: Bilateral upper extremity supported, During functional activity, No upper extremity supported Standing balance-Leahy Scale: Fair Standing balance comment: reliant on external support                            Cognition Arousal/Alertness: Awake/alert Behavior During Therapy: WFL for tasks assessed/performed Overall Cognitive Status: Within Functional Limits for tasks assessed  General Comments General comments (skin integrity, edema, etc.): Pt on RA at rest 94% with ambulation on RW briefly dropped to 85%O2 however with seated restbreak and pursed lip breathing rebounded to 92%O2 quickly, pt with complaints of dizziness with sitting  EoB earlier in day after Lasix given, reports not dizzy after getting up with therapy, BP WFl      Pertinent Vitals/Pain Pain Assessment Pain Assessment: Faces Faces Pain Scale: Hurts a little bit Pain Location: LLE Pain Descriptors / Indicators: Sore, Aching Pain Intervention(s): Limited activity within patient's tolerance, Monitored during session, Repositioned     PT Goals (current goals can now be found in the care plan section) Acute Rehab PT Goals PT Goal Formulation: With patient Time For Goal Achievement: 06/05/22 Potential to Achieve Goals: Good Progress towards PT goals: Progressing toward goals    Frequency    Min 3X/week      PT Plan Discharge plan needs to be updated       AM-PAC PT "6 Clicks" Mobility   Outcome Measure  Help needed turning from your back to your side while in a flat bed without using bedrails?: A Little Help needed moving from lying on your back to sitting on the side of a flat bed without using bedrails?: A Lot Help needed moving to and from a bed to a chair (including a wheelchair)?: A Little Help needed standing up from a chair using your arms (e.g., wheelchair or bedside chair)?: A Little Help needed to walk in hospital room?: A Lot Help needed climbing 3-5 steps with a railing? : A Lot 6 Click Score: 15    End of Session Equipment Utilized During Treatment: Oxygen Activity Tolerance: Patient tolerated treatment well Patient left: in bed;with call bell/phone within reach;with bed alarm set;with family/visitor present Nurse Communication: Mobility status PT Visit Diagnosis: Other abnormalities of gait and mobility (R26.89);Muscle weakness (generalized) (M62.81)     Time: 2585-2778 PT Time Calculation (min) (ACUTE ONLY): 49 min  Charges:  $Gait Training: 8-22 mins $Therapeutic Exercise: 8-22 mins $Therapeutic Activity: 8-22 mins                     Kenston Longton B. Migdalia Dk PT, DPT Acute Rehabilitation Services Please use  secure chat or  Call Office [phone]    [facility] 05/25/2022, 2:47 PM

## 2022-05-25 NOTE — Progress Notes (Signed)
Daily Progress Note Intern Pager: [phone]  Patient name: Lisa [name] Medical record number: [id number] Date of birth: 05-15-1941 Age: 81 y.o. Gender: female  Primary Care Provider: Patient, No Pcp Per Consultants: Cardiology (EP) Code Status: Full   Pt Overview and Major Events to Date:  10/12-admitted   Assessment and Plan:   81 year old female who presented with multiple VF episodes with ICD shocks likely in the setting of pneumonia meeting sepsis criteria. 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   * Sepsis due to pneumonia ([place]) Improving, on minimal supplemental oxygen less than 1 L. Bcx NGTD. - continue cefdinir (day 5/5) - completed azithro course - Mucinex as needed - F/u Bcx - wean to RA as tolerated  Ventricular fibrillation (HCC) Stable, management per EP.  Appreciate recommendations. -Continue PO amiodarone 400 mg BID for another 4 days, then daily -Continue mexilitine -Replete lytes as necessary to keep K>4 and Mg>2  Lymphedema Chronic, stable. On IV diuresis. Will likely continue home torsemide 20 mg dose at d/c given her volume overload was likely from not taking the medication for about a week before admission. -Continue diuresis -Consider unna boots for venous stasis  CML (chronic myelocytic leukemia) (HCC) Chronic, stable. - continue imatinib  Essential hypertension BP soft. -Holding carvedilol -Continue diuresis -Monitor BP  Stage 3b chronic kidney disease (CKD) (HCC) Around baseline, Cr 1.2-1.3. -Monitor while diuresing with daily BMP  Macrocytic anemia Hemoglobin 7.4>8.8 after transfusion 10/14. Was 7.9 yesterday>9.5 on recheck. 8.5 this morning. Consider fluctuations 2/2 dilutional effects/phlebotomy with increased IV medications and blood draws here in hospital. - transfuse for Hgb < 8 (history of CAD) - daily CBC  Chronic systolic heart  failure (HCC) HFmrEF - IV diuresis - strict I/O, daily weights   FEN/GI: Regular diet PPx: Enoxaparin Dispo:SNF given deconditioning and lives alone. Barriers include continued need for diuresis and SNF placement.  Subjective:  Doing well this morning. Left leg continues to hurt, though it is improved since admission, and she is able to move it much more than before. She has taken her oxygen off because it is drying out her nose; she is breathing well without it in.  Objective: Temp:  [98 F (36.7 C)-98.7 F (37.1 C)] 98 F (36.7 C) (10/16 0759) Pulse Rate:  [74-85] 80 (10/16 0759) Resp:  [18-24] 20 (10/16 0759) BP: (137-148)/(45-54) 146/49 (10/16 0759) SpO2:  [92 %-97 %] 93 % (10/16 0759) Weight:  [034 kg] 116 kg (10/16 0438) Physical Exam: General: Alert and oriented, in NAD Skin: Warm, dry; venous stasis dermatitis overlying both LE HEENT: NCAT, EOM grossly normal, midline nasal septum Cardiac: RRR, 2/6 systolic murmur appreciated Respiratory: Mild wheezes in mid-posterior lung fields, breathing and speaking comfortably on RA Extremities: Marked lymphedema to BLE with dried serous fluid and venous stasis dermatitis; lower areas of BLE lightly wrapped Neurological: No gross focal deficit Psychiatric: Appropriate mood and affect   Laboratory: Most recent CBC Lab Results  Component Value Date   WBC 9.2 05/25/2022   HGB 8.5 (L) 05/25/2022   HCT 26.2 (L) 05/25/2022   MCV 94.6 05/25/2022   PLT 173 05/25/2022   Most recent BMP    Latest Ref Rng & Units 05/25/2022    3:46 AM  BMP  Glucose 70 - 99 mg/dL 107   BUN 8 - 23 mg/dL 15   Creatinine 0.44 - 1.00 mg/dL 1.30   Sodium 135 -  145 mmol/L 137   Potassium 3.5 - 5.1 mmol/L 3.5   Chloride 98 - 111 mmol/L 103   CO2 22 - 32 mmol/L 26   Calcium 8.9 - 10.3 mg/dL 7.7    Ethelene Hal, MD 05/25/2022, 11:26 AM PGY-1, [place] Intern pager: [phone], text pages welcome Secure chat group [place]

## 2022-05-25 NOTE — Care Management Important Message (Signed)
Important Message  Patient Details  Name: Lisa [name] MRN: [id number] Date of Birth: June 24, 1941   Medicare Important Message Given:  Yes     Orbie Pyo 05/25/2022, 3:29 PM

## 2022-05-25 NOTE — Progress Notes (Addendum)
Rounding Note    Patient Name: Lisa [name] Date of Encounter: 05/25/2022  [place] Cardiologist: [name], MD   Subjective   NAEON. Less SOB, able to move legs easier  Inpatient Medications    Scheduled Meds:  allopurinol  100 mg Oral Daily   amiodarone  400 mg Oral BID   Followed by   Derrill Memo ON 05/28/2022] amiodarone  400 mg Oral Daily   aspirin EC  81 mg Oral QHS   atorvastatin  20 mg Oral Daily   cefdinir  300 mg Oral Q12H   enoxaparin (LOVENOX) injection  40 mg Subcutaneous Q24H   furosemide  40 mg Intravenous BID   imatinib  400 mg Oral Q supper   lactose free nutrition  237 mL Oral Daily   mexiletine  250 mg Oral Q12H   multivitamin with minerals  1 tablet Oral Daily   thyroid  15 mg Oral QAC breakfast   Continuous Infusions:   PRN Meds: acetaminophen **OR** acetaminophen, guaiFENesin, mouth rinse   Vital Signs    Vitals:   05/25/22 0000 05/25/22 0401 05/25/22 0438 05/25/22 0759  BP: (!) 137/51 (!) 137/49  (!) 146/49  Pulse: 84 84  80  Resp: (!) '24 20  20  '$ Temp:  98.7 F (37.1 C)  98 F (36.7 C)  TempSrc:  Oral  Oral  SpO2: 92% 95%  93%  Weight:   116 kg   Height:        Intake/Output Summary (Last 24 hours) at 05/25/2022 0820 Last data filed at 05/25/2022 0300 Gross per 24 hour  Intake --  Output 1300 ml  Net -1300 ml       05/25/2022    4:38 AM 05/24/2022    4:42 AM 05/23/2022    5:00 AM  Last 3 Weights  Weight (lbs) 255 lb 11.7 oz 255 lb 4.7 oz 249 lb 9 oz  Weight (kg) 116 kg 115.8 kg 113.2 kg      Telemetry     SR, no VT - Personally Reviewed  ECG     No new EKGs  to review   Physical Exam   GEN: No acute distress.  Elderly. Obese.  Neck: JVD at low-mid neck at 30 degrees Cardiac: RRR, no murmurs, rubs, or gallops.  Respiratory: Clear to auscultation bilaterally, [place] off nose with sats in mid-90s while talking GI: Soft, nontender, non-distended, obvese MS: significant chronic LE edema; LE wraps  removed this morning. Skin is erythematous but obvious open/draining wounds. Painful to touch.   Neuro:  Nonfocal  Psych: Normal affect   Labs    High Sensitivity Troponin:  No results for input(s): "TROPONINIHS" in the last 720 hours.   Chemistry Recent Labs  Lab 05/21/22 1401 05/22/22 0530 05/23/22 0425 05/24/22 0422 05/25/22 0346  NA 139 136 136 134* 137  K 3.1* 3.7 3.7 3.2* 3.5  CL 107 106 104 104 103  CO2 '23 23 24 24 26  '$ GLUCOSE 138* 119* 108* 129* 107*  BUN '10 15 17 18 15  '$ CREATININE 1.11* 1.30* 1.46* 1.40* 1.30*  CALCIUM 7.9* 7.6* 7.7* 7.5* 7.7*  MG 1.6* 2.1  --   --   --   PROT 5.9*  --   --   --   --   ALBUMIN 2.7*  --   --   --   --   AST 37  --   --   --   --   ALT 29  --   --   --   --  ALKPHOS 66  --   --   --   --   BILITOT 1.4*  --   --   --   --   GFRNONAA 50* 42* 36* 38* 42*  ANIONGAP '9 7 8 6 8     '$ Lipids No results for input(s): "CHOL", "TRIG", "HDL", "LABVLDL", "LDLCALC", "CHOLHDL" in the last 168 hours.  Hematology Recent Labs  Lab 05/23/22 2356 05/24/22 0422 05/24/22 1310 05/25/22 0346  WBC 14.3* 12.0*  --  9.2  RBC 2.78* 2.54*  --  2.77*  HGB 8.8* 7.9* 9.5* 8.5*  HCT 26.2* 23.8* 28.4* 26.2*  MCV 94.2 93.7  --  94.6  MCH 31.7 31.1  --  30.7  MCHC 33.6 33.2  --  32.4  RDW 17.2* 17.2*  --  16.6*  PLT 150 142*  --  173    Thyroid  Recent Labs  Lab 05/21/22 1401  TSH 1.142     BNPNo results for input(s): "BNP", "PROBNP" in the last 168 hours.  DDimer No results for input(s): "DDIMER" in the last 168 hours.   Radiology    No results found.   Assessment & Plan    Lisa [name] is a 81 y.o. female with a past medical history significant for HTN, HLD, LBBB, CAD (DES to LAD 2017), chronic CHF (systolic), NICM, with recovery of her LVEF s/p CRT-D, CML (follows at [facility], being treated with Gleevac), chronic BLL wounds (managed at [facility] with "low freq u/s") being seen for evaluation of VF/ICD shocks.   #)  VT/VF Improved. Amiodarone PO '400mg'$  twice daily for another 4 days then decrease to once daily '400mg'$  PO daily beginning 10/21 Continue mexilitine Keep K>4, Mg>2 Will schedule outpatient follow-up    #) Acute on chronic systolic HF Appears more euvolemic, no longer requiring O2 continuously, able to move bilat lower extremities easier - defer to primary team   EP will sign off, but remains available. Please re-consult if needed    For questions or updates, please contact [place] Please consult [email or web address] for contact info under        Signed, Mamie Levers, NP  05/25/2022, 8:20 AM    EP Attending  Patient seen and examined. Agree with above. The patient is doing well after re-bolus of amiodarone and treatment of CHF and pneumonia. She remains morbidly obese and has massive lymphedema on exam of the lower extermities. Tele demonstrates NSR. She appears to be tolerating amio 400 bid. Continue mexitil as well. She can be discharged home/SNF from my perspective, likely to rehab. Agree with 400 bid of amio and would stop the Mexitil once the oral amio is loaded. She had been on 100 mg daily prior to admit.  Carleene Overlie Damen [place],MD

## 2022-05-26 DIAGNOSIS — I872 Venous insufficiency (chronic) (peripheral): Secondary | ICD-10-CM | POA: Diagnosis not present

## 2022-05-26 DIAGNOSIS — I890 Lymphedema, not elsewhere classified: Secondary | ICD-10-CM | POA: Diagnosis not present

## 2022-05-26 DIAGNOSIS — I5022 Chronic systolic (congestive) heart failure: Secondary | ICD-10-CM | POA: Diagnosis not present

## 2022-05-26 DIAGNOSIS — I5023 Acute on chronic systolic (congestive) heart failure: Secondary | ICD-10-CM | POA: Diagnosis not present

## 2022-05-26 LAB — CBC
HCT: 29.6 % — ABNORMAL LOW (ref 36.0–46.0)
Hemoglobin: 10 g/dL — ABNORMAL LOW (ref 12.0–15.0)
MCH: 31.1 pg (ref 26.0–34.0)
MCHC: 33.8 g/dL (ref 30.0–36.0)
MCV: 91.9 fL (ref 80.0–100.0)
Platelets: 218 10*3/uL (ref 150–400)
RBC: 3.22 MIL/uL — ABNORMAL LOW (ref 3.87–5.11)
RDW: 15.9 % — ABNORMAL HIGH (ref 11.5–15.5)
WBC: 9 10*3/uL (ref 4.0–10.5)
nRBC: 0 % (ref 0.0–0.2)

## 2022-05-26 LAB — CULTURE, BLOOD (ROUTINE X 2)
Culture: NO GROWTH
Culture: NO GROWTH
Special Requests: ADEQUATE
Special Requests: ADEQUATE

## 2022-05-26 LAB — BASIC METABOLIC PANEL
Anion gap: 8 (ref 5–15)
Anion gap: 8 (ref 5–15)
BUN: 13 mg/dL (ref 8–23)
BUN: 12 mg/dL (ref 8–23)
CO2: 30 mmol/L (ref 22–32)
CO2: 29 mmol/L (ref 22–32)
Calcium: 7.8 mg/dL — ABNORMAL LOW (ref 8.9–10.3)
Calcium: 7.9 mg/dL — ABNORMAL LOW (ref 8.9–10.3)
Chloride: 99 mmol/L (ref 98–111)
Chloride: 98 mmol/L (ref 98–111)
Creatinine, Ser: 1.11 mg/dL — ABNORMAL HIGH (ref 0.44–1.00)
Creatinine, Ser: 1.37 mg/dL — ABNORMAL HIGH (ref 0.44–1.00)
GFR, Estimated: 50 mL/min — ABNORMAL LOW (ref >60)
GFR, Estimated: 39 mL/min — ABNORMAL LOW (ref >60)
Glucose, Bld: 112 mg/dL — ABNORMAL HIGH (ref 70–99)
Glucose, Bld: 138 mg/dL — ABNORMAL HIGH (ref 70–99)
Potassium: 3.3 mmol/L — ABNORMAL LOW (ref 3.5–5.1)
Potassium: 3.9 mmol/L (ref 3.5–5.1)
Sodium: 137 mmol/L (ref 135–145)
Sodium: 135 mmol/L (ref 135–145)

## 2022-05-26 LAB — MAGNESIUM: Magnesium: 1.6 mg/dL — ABNORMAL LOW (ref 1.7–2.4)

## 2022-05-26 MED ORDER — TRIAMCINOLONE ACETONIDE 0.025 % EX CREA: TOPICAL_CREAM | Freq: Two times a day (BID) | CUTANEOUS | Status: DC

## 2022-05-26 MED ORDER — POTASSIUM CHLORIDE 20 MEQ PO PACK
40.0000 meq | PACK | Freq: Once | ORAL | Status: AC
  Administered 2022-05-26: 40 meq via ORAL
  Filled 2022-05-26: qty 2

## 2022-05-26 MED ORDER — TORSEMIDE 20 MG PO TABS
20.0000 mg | ORAL_TABLET | Freq: Every day | ORAL | Status: DC
  Administered 2022-05-26 – 2022-05-28 (×3): 20 mg via ORAL
  Filled 2022-05-26 (×3): qty 1

## 2022-05-26 MED ORDER — TRIAMCINOLONE ACETONIDE 0.1 % EX CREA
TOPICAL_CREAM | Freq: Two times a day (BID) | CUTANEOUS | Status: DC
  Filled 2022-05-26: qty 15

## 2022-05-26 MED ORDER — ONDANSETRON HCL 4 MG/2ML IJ SOLN
4.0000 mg | Freq: Once | INTRAMUSCULAR | Status: AC
  Administered 2022-05-26: 4 mg via INTRAVENOUS

## 2022-05-26 MED ORDER — POTASSIUM CHLORIDE 20 MEQ PO PACK
40.0000 meq | PACK | Freq: Once | ORAL | Status: DC
  Filled 2022-05-26: qty 2

## 2022-05-26 MED ORDER — ONDANSETRON HCL 4 MG/2ML IJ SOLN
INTRAMUSCULAR | Status: AC
  Administered 2022-05-26: 4 mg via INTRAVENOUS
  Filled 2022-05-26: qty 2

## 2022-05-26 MED ORDER — POTASSIUM CHLORIDE CRYS ER 20 MEQ PO TBCR
40.0000 meq | EXTENDED_RELEASE_TABLET | Freq: Once | ORAL | Status: AC
  Administered 2022-05-26: 40 meq via ORAL
  Filled 2022-05-26: qty 2

## 2022-05-26 MED ORDER — MAGNESIUM SULFATE 50 % IJ SOLN
3.0000 g | Freq: Once | INTRAVENOUS | Status: AC
  Administered 2022-05-26: 3 g via INTRAVENOUS
  Filled 2022-05-26: qty 6

## 2022-05-26 MED ORDER — MAGNESIUM SULFATE IN D5W 1-5 GM/100ML-% IV SOLN
1.0000 g | Freq: Once | INTRAVENOUS | Status: AC
  Administered 2022-05-26: 1 g via INTRAVENOUS
  Filled 2022-05-26: qty 100

## 2022-05-26 NOTE — Progress Notes (Signed)
Daily Progress Note Intern Pager: [phone]  Patient name: Lisa [name] Medical record number: [id number] Date of birth: February 15, 1941 Age: 81 y.o. Gender: female  Primary Care Provider: Patient, No Pcp Per Consultants: Cardiology (EP) Code Status: Full   Pt Overview and Major Events to Date:  10/12-admitted   Assessment and Plan:   81 year old female who presented with multiple VF episodes with ICD shocks likely in the setting of pneumonia meeting sepsis criteria. 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   * Sepsis due to pneumonia ([place]) Improved. Now on RA. Bcx NGTD. - now s/p azithro and cefdinir course - Mucinex as needed for phlegm - Flutter valve - wean to RA as tolerated  Ventricular fibrillation (HCC) Stable, EP signed off.  Appreciate recommendations. -Continue PO amiodarone 400 mg BID with last dose 10/19, then daily thereafter starting 10/20 -Continue mexilitine with last dose 10/19 with completion of amio loading -Replete lytes as necessary to keep K>4 and Mg>2  Lymphedema Chronic, stable. Stasis dermatitis +/- lipodermatosclerosis likely playing role in pain in that leg. Given increased skin turgor, will transition back to home torsemide 20 mg.  -D/c IV lasix, initiate home torsemide 20 mg -Triamcinolone 0.1% BID to affected area for 5 days for stasis dermatitis -Ordered unna boots for venous stasis  CML (chronic myelocytic leukemia) (HCC) Chronic, stable. - Continue imatinib  Essential hypertension BP soft. -Holding carvedilol -Continue diuresis -Monitor BP  Stage 3b chronic kidney disease (CKD) (HCC) Around baseline, Cr 1.2-1.3. -Monitor while diuresing with daily BMP  Macrocytic anemia Hgb 10 today and stable with MCV 91.9 s/p transfusion on admission. - transfuse for Hgb < 8 (history of CAD) - daily CBC  Chronic systolic heart failure (HCC) HFmrEF.  Appears less volume overloaded today. - IV diuresis - strict I/O, daily weights   FEN/GI: Regular diet PPx: Enoxaparin Dispo:SNF given deconditioning and lives alone. Barriers include continued need for diuresis and SNF placement.  Subjective:  She has questions about some things this morning. She felt like she got really warm overnight while under a lot of covers, but she took Tylenol and felt better. Her left leg still hurts her over the site of venous stasis dermatitis. She also feels more nauseous than usual and vomited some of the potassium this morning; she feels better now that she had some zofran.  Objective: Temp:  [98.2 F (36.8 C)-99.5 F (37.5 C)] 98.2 F (36.8 C) (10/17 1150) Pulse Rate:  [73-95] 83 (10/17 1150) Resp:  [18-28] 20 (10/17 1150) BP: (106-135)/(41-55) 106/41 (10/17 1150) SpO2:  [87 %-94 %] 92 % (10/17 1150) Weight:  [115.6 kg] 115.6 kg (10/17 0421) Physical Exam: General: Alert and oriented, in NAD Skin: Warm, dry; venous stasis dermatitis overlying both LE, wrinkled skin overlying BLE HEENT: NCAT, EOM grossly normal, midline nasal septum Cardiac: RRR, 2/6 systolic murmur appreciated Respiratory: CTAB, breathing and speaking comfortably on RA Extremities: Marked lymphedema to BLE with dried serous fluid and venous stasis dermatitis; lower areas of BLE lightly wrapped Neurological: No gross focal deficit Psychiatric: Appropriate mood and affect   Laboratory: Most recent CBC Lab Results  Component Value Date   WBC 9.0 05/26/2022   HGB 10.0 (L) 05/26/2022   HCT 29.6 (L) 05/26/2022   MCV 91.9 05/26/2022   PLT 218 05/26/2022   Most recent BMP    Latest Ref Rng & Units 05/26/2022    2:55 AM  BMP  Glucose 70 - 99 mg/dL 112   BUN 8 - 23 mg/dL 13   Creatinine 0.44 - 1.00 mg/dL 1.11   Sodium 135 - 145 mmol/L 137   Potassium 3.5 - 5.1 mmol/L 3.3   Chloride 98 - 111 mmol/L 99   CO2 22 - 32 mmol/L 30   Calcium 8.9 - 10.3 mg/dL 7.8    Mg  1.6  Ethelene Hal, MD 05/26/2022, 12:49 PM PGY-1, [place] Intern pager: [phone], text pages welcome Secure chat group [place]

## 2022-05-26 NOTE — TOC Progression Note (Addendum)
Transition of Care [facility]) - Progression Note    Patient Details  Name: Lisa [name] MRN: [id number] Date of Birth: 04/07/1941  Transition of Care [facility]) CM/SW Guy, LCSW Phone Number: 05/26/2022, 3:58 PM  Clinical Narrative:    CSW met with patient and her daughter at bedside. Patient inquired about CIR. CSW staffed case with CIR and patient does not meet criteria. Patient interested in [place]; [place] awaiting response. Left voicemail for [facility] to check status of referral. Daughter stated she will follow up with the other facility she is aware of in [place] as well.   Daughter met with CSW outside of room. She reported that the patient's house is uninhabitable with bugs and rodents and mold. She stated her and her brothers are wanting to get patient to an independent living facility or assisted living after rehab stay. CSW suggested they start identifying facilities and seeing if they have a waitlist and the cost for each.     Barriers to Discharge: Continued Medical Work up  Expected Discharge Plan and Services     Discharge Planning Services: CM Consult   Living arrangements for the past 2 months: Single Family Home                                       Social Determinants of Health (SDOH) Interventions Housing Interventions: Intervention Not Indicated  Readmission Risk Interventions     No data to display

## 2022-05-26 NOTE — Progress Notes (Signed)
Orthopedic Tech Progress Note Patient Details:  Lisa [name] 28-Feb-1941 [id number]  Ortho Devices Type of Ortho Device: Ace wrap, Unna boot Ortho Device/Splint Location: BLE Ortho Device/Splint Interventions: Ordered, Application, Adjustment   Post Interventions Patient Tolerated: Well Instructions Provided: Care of device  [name] 05/26/2022, 2:20 PM

## 2022-05-26 NOTE — Hospital Course (Signed)
Lisa [name] is a 81 y.o. female who presented with SOB, dizziness, and cough and found to have pneumonia and multiple runs of VF shocked by ICD. 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 A brief hospital course is below.   Sepsis due to pneumonia [facility]) Presented with SOB, dizziness, and cough. She was febrile, tachypneic, and required 0.25L [place]. Expiratory wheezes and crackles heard on exam. CXR suggestive of pneumonia. She was given rocephin and azithromycin. Azithromycin was transitioned to PO, and rocephin was switched to PO cefdinir with improvement. By discharge, she was breathing well on RA.   Ventricular fibrillation [facility]) Presented with interrogation findings of multiple runs of VF with successful shocks. Suspect acute worsening of VF episodes d/t pneumonia. Cardiology consulted in ED; no ischemic workup required, though amiodarone drip and PO mexiletine started. By discharge, patient had no further runs of VF on cardiac monitoring, mexiletine was d/c, and amiodarone was transitioned to PO until cardiology follow up.   Lymphedema Presented with acute on chronic lymphedema. Bilateral venous stasis dermatitis noted. Wound care nurse consulted; recommendations followed through admission. IV lasix continued until increase skin turgor in lower limbs as which point home PO torsemide was restarted. Unna boots was tried for persistent discomfort without improvement. Given continued symptoms, trialed triamcinolone 0.1% BID to area. By discharge, her symptoms had improved.  Other chronic conditions: CML: follows with [facility] for oral chemo; imatinib continued HTN: Coreg held per cardiology; diuresis continued as above CHF: likely overloaded d/t missing torsemide at home; improved on diuretics as above  Issues for follow up: Ensure administration of ONE last dose of MEXILETINE TONIGHT  10/19. Then only amiodarone thereafter under cardiology follow up. Ensure respiratory status has continued to improve Ensure cardiology follow up on 06/29/2022 Follow up improvement/stability of lymphedema and stasis dermatitis

## 2022-05-26 NOTE — Progress Notes (Signed)
  EP to follow at a distance.   VT is quiescent.   Potassium3.3* (10/17 0255) Magnesium  1.6* (10/17 0255) Creatinine, ser  1.11* (10/17 0255) Keep K > 4.0 and Mg > 2.0   Continue amiodarone 400 mg BID until 10/19, then decrease to once daily.   Continue amiodarone 400 mg once daily until follow up, currently scheduled for 06/29/2022.  Please call back with questions.   Legrand Como [address]" [place], PA-C  05/26/2022 8:46 AM

## 2022-05-26 NOTE — Progress Notes (Signed)
Occupational Therapy Treatment Patient Details Name: Lisa [name] MRN: [id number] DOB: 11-Jul-1941 Today's Date: 05/26/2022   History of present illness 81 yo female presents to [facility] on 10/12 with dizziness, increased LE swelling from stopping "fluid pill" x1 week ago, and ICD firings. Workup for sepsis PNA, VF storm. 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   OT comments  Pt making limited progress towards OT goals today. Pt requiring max A LB dressing as well as for bed mobility (assist with BLE as well as trunk elevation and use of bed pad to bring hips EOB), min A for transfers with RW. Pt set up for UB dressing and grooming tasks in chair. Pt now agreeable to SNF (she would like to be in [place] closer to daughter) and that is essential to maximize safety and independence to return to PLOF. OT will continue to follow acutely. Pt on RA throughout session and BP WFL.   Recommendations for follow up therapy are one component of a multi-disciplinary discharge planning process, led by the attending physician.  Recommendations may be updated based on patient status, additional functional criteria and insurance authorization.    Follow Up Recommendations  Skilled nursing-short term rehab (<3 hours/day) (agreeable to SNF now)    Assistance Recommended at Discharge Frequent or constant Supervision/Assistance  Patient can return home with the following  A little help with walking and/or transfers;A lot of help with bathing/dressing/bathroom;Assist for transportation   Equipment Recommendations       Recommendations for Other Services      Precautions / Restrictions Precautions Precautions: Fall Restrictions Weight Bearing Restrictions: No       Mobility Bed Mobility Overal bed mobility: Needs Assistance Bed Mobility: Supine to Sit     Supine to sit: HOB elevated, Mod assist Sit to  supine: Max assist   General bed mobility comments: pt able to adjust HoB up to decrease need for elevation of her trunk, pt requiring modA for moving LE across bed surface once LE over EoB pt requires no further assist but increased effort to come to sitting on the EoB    Transfers Overall transfer level: Needs assistance Equipment used: Rolling walker (2 wheels) Transfers: Sit to/from Stand Sit to Stand: Min assist     Step pivot transfers: Min assist     General transfer comment: multimodal cuing for hand placement for power up, min physical assist to for balance     Balance Overall balance assessment: Needs assistance Sitting-balance support: No upper extremity supported, Feet supported Sitting balance-Leahy Scale: Fair     Standing balance support: Bilateral upper extremity supported, During functional activity, No upper extremity supported Standing balance-Leahy Scale: Fair Standing balance comment: reliant on external support                           ADL either performed or assessed with clinical judgement   ADL Overall ADL's : Needs assistance/impaired     Grooming: Wash/dry hands;Wash/dry face;Set up;Sitting Grooming Details (indicate cue type and reason): in recliner         Upper Body Dressing : Set up;Sitting Upper Body Dressing Details (indicate cue type and reason): new gown Lower Body Dressing: Maximal assistance;Sit to/from stand Lower Body Dressing Details (indicate cue type and reason): socks Toilet Transfer: Cueing for safety;Cueing for sequencing;Rolling walker (2 wheels);Minimal assistance (regular- would like to  try [place]) Armed forces technical officer Details (indicate cue type and reason): cues for hand placement         Functional mobility during ADLs: Rolling walker (2 wheels);Minimal assistance General ADL Comments: decreased activity tolerance, fatigues quickly    Extremity/Trunk Assessment     Lower Extremity Assessment LLE Deficits /  Details: LE wounds L>R, bilat LE edema distal>proximal LLE: Unable to fully assess due to pain        Vision       Perception     Praxis      Cognition Arousal/Alertness: Awake/alert Behavior During Therapy: WFL for tasks assessed/performed Overall Cognitive Status: Within Functional Limits for tasks assessed                                 General Comments: self-limiting        Exercises      Shoulder Instructions       General Comments SpO2 and BP WFL throughout session    Pertinent Vitals/ Pain       Pain Assessment Pain Assessment: Faces Faces Pain Scale: Hurts a little bit Pain Location: LLE Pain Descriptors / Indicators: Sore, Aching Pain Intervention(s): Limited activity within patient's tolerance, Monitored during session, Repositioned (elevation of BLE)  Home Living                                          Prior Functioning/Environment              Frequency  Min 2X/week        Progress Toward Goals  OT Goals(current goals can now be found in the care plan section)  Progress towards OT goals: Progressing toward goals  Acute Rehab OT Goals Patient Stated Goal: get stronger, to go to rehab OT Goal Formulation: With patient Time For Goal Achievement: 06/06/22 Potential to Achieve Goals: Good ADL Goals Pt Will Perform Lower Body Bathing: with min guard assist;sit to/from stand Pt Will Perform Lower Body Dressing: with min guard assist;with adaptive equipment;sit to/from stand Pt Will Transfer to Toilet: with supervision;ambulating Pt Will Perform Tub/Shower Transfer: with min guard assist;ambulating;shower seat;rolling walker  Plan Discharge plan needs to be updated;Frequency remains appropriate    Co-evaluation                 AM-PAC OT "6 Clicks" Daily Activity     Outcome Measure   Help from another person eating meals?: None Help from another person taking care of personal grooming?: A  Little Help from another person toileting, which includes using toliet, bedpan, or urinal?: A Little Help from another person bathing (including washing, rinsing, drying)?: A Lot Help from another person to put on and taking off regular upper body clothing?: A Little Help from another person to put on and taking off regular lower body clothing?: A Lot 6 Click Score: 17    End of Session Equipment Utilized During Treatment: Gait belt;Rolling walker (2 wheels)  OT Visit Diagnosis: Unsteadiness on feet (R26.81);Other abnormalities of gait and mobility (R26.89);Repeated falls (R29.6);Muscle weakness (generalized) (M62.81)   Activity Tolerance Patient limited by fatigue   Patient Left in chair;with call bell/phone within reach;with nursing/sitter in room;with chair alarm set   Nurse Communication Mobility status        Time: 1008-1040 OT Time Calculation (min): 32 min  Charges:  OT General Charges $OT Visit: 1 Visit OT Treatments $Self Care/Home Management : 23-37 mins  Jesse Sans OTR/L Acute Rehabilitation Services Office: [place] 05/26/2022, 11:51 AM

## 2022-05-27 DIAGNOSIS — J189 Pneumonia, unspecified organism: Secondary | ICD-10-CM | POA: Diagnosis not present

## 2022-05-27 DIAGNOSIS — I872 Venous insufficiency (chronic) (peripheral): Secondary | ICD-10-CM | POA: Diagnosis not present

## 2022-05-27 DIAGNOSIS — I5023 Acute on chronic systolic (congestive) heart failure: Secondary | ICD-10-CM | POA: Diagnosis not present

## 2022-05-27 DIAGNOSIS — I890 Lymphedema, not elsewhere classified: Secondary | ICD-10-CM | POA: Diagnosis not present

## 2022-05-27 LAB — BASIC METABOLIC PANEL
Anion gap: 7 (ref 5–15)
BUN: 12 mg/dL (ref 8–23)
CO2: 31 mmol/L (ref 22–32)
Calcium: 8 mg/dL — ABNORMAL LOW (ref 8.9–10.3)
Chloride: 97 mmol/L — ABNORMAL LOW (ref 98–111)
Creatinine, Ser: 1.24 mg/dL — ABNORMAL HIGH (ref 0.44–1.00)
GFR, Estimated: 44 mL/min — ABNORMAL LOW (ref >60)
Glucose, Bld: 111 mg/dL — ABNORMAL HIGH (ref 70–99)
Potassium: 3.5 mmol/L (ref 3.5–5.1)
Sodium: 135 mmol/L (ref 135–145)

## 2022-05-27 MED ORDER — MELATONIN 3 MG PO TABS
3.0000 mg | ORAL_TABLET | Freq: Every day | ORAL | Status: DC
  Administered 2022-05-27: 3 mg via ORAL
  Filled 2022-05-27: qty 1

## 2022-05-27 MED ORDER — POTASSIUM CHLORIDE CRYS ER 20 MEQ PO TBCR
40.0000 meq | EXTENDED_RELEASE_TABLET | Freq: Once | ORAL | Status: AC
  Administered 2022-05-27: 40 meq via ORAL
  Filled 2022-05-27: qty 2

## 2022-05-27 MED ORDER — TRIAMCINOLONE ACETONIDE 0.1 % EX CREA
TOPICAL_CREAM | Freq: Two times a day (BID) | CUTANEOUS | Status: DC
  Filled 2022-05-27: qty 15

## 2022-05-27 NOTE — TOC Progression Note (Signed)
Transition of Care [facility]) - Progression Note    Patient Details  Name: Lisa [name] MRN: [id number] Date of Birth: 1940-10-23  Transition of Care [facility] CM/SW [place], LCSW Phone Number: 05/27/2022, 1:55 PM  Clinical Narrative:    CSW received message from patient's daughter stating patient would like to go to [place]. CSW spoke with [facility] and they will have a bed for patient tomorrow if medically stable. Patient is requesting [facility] for transport. Daughter will bring Gleevec medication from home.      Barriers to Discharge: Continued Medical Work up  Expected Discharge Plan and Services     Discharge Planning Services: CM Consult   Living arrangements for the past 2 months: Single Family Home                                       Social Determinants of Health (SDOH) Interventions Housing Interventions: Intervention Not Indicated  Readmission Risk Interventions     No data to display

## 2022-05-27 NOTE — Progress Notes (Signed)
Daily Progress Note Intern Pager: [phone]  Patient name: Lisa [name] Medical record number: [id number] Date of birth: 06-Aug-1941 Age: 81 y.o. Gender: female  Primary Care Provider: Patient, No Pcp Per Consultants: Cardiology (EP) Code Status: Full   Pt Overview and Major Events to Date:  10/12-admitted   Assessment and Plan:   81 year old female who presented with multiple VF episodes with ICD shocks likely in the setting of pneumonia meeting sepsis criteria. 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   * Sepsis due to pneumonia ([place]) Improved. Now on RA. Bcx NGTD. 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  Appreciate recommendations. -Continue PO amiodarone 400 mg BID with last dose 10/19, then daily thereafter starting 10/20 -Continue mexilitine with last dose 10/19 with completion of amio loading -Replete lytes as necessary to keep K>4 and Mg>2  Lymphedema Chronic, stable. Stasis dermatitis +/- lipodermatosclerosis likely playing role in pain in that leg. Given increased skin turgor, will transition back to home torsemide 20 mg.  -Continue home torsemide 20 mg -Triamcinolone 0.1% BID to affected area for 4 more days for stasis dermatitis -D/c unna boots given no significant improvement  CML (chronic myelocytic leukemia) (HCC) Chronic, stable. - Continue imatinib  Essential hypertension BP soft. -Holding carvedilol -Continue diuresis -Monitor BP  Stage 3b chronic kidney disease (CKD) (HCC) Around baseline, Cr 1.2-1.3. -Monitor while diuresing with daily BMP  Macrocytic anemia Hgb 10 today and stable with MCV 91.9 s/p transfusion on admission. - transfuse for Hgb < 8 (history of CAD) - daily CBC  Chronic systolic heart failure (HCC) HFmrEF. - continue PO home  diuresis - strict I/O, daily weights   FEN/GI: Regular diet PPx: Enoxaparin Dispo:SNF most likely  Subjective:  Feels her left leg is still hurting despite the unna boots and wound care. Did not receive steroids due to unna boots being on. Would like to get her legs feeling better before she leaves to SNF.  Objective: Temp:  [98.1 F (36.7 C)-99.9 F (37.7 C)] 98.9 F (37.2 C) (10/18 0332) Pulse Rate:  [80-84] 84 (10/18 0800) Resp:  [20-25] 20 (10/18 0800) BP: (123-136)/(47-54) 136/54 (10/18 0800) SpO2:  [94 %-95 %] 95 % (10/18 0800) Weight:  [115.3 kg] 115.3 kg (10/18 0500) Physical Exam: General: Alert and oriented, in NAD Skin: Warm, dry HEENT: NCAT, EOM grossly normal, midline nasal septum Cardiac: RRR, 2/6 systolic murmur Respiratory: CTAB, breathing and speaking comfortably, [place] running with <1 L half in-out of her nose Extremities: BLE in unna boots Neurological: No gross focal deficit Psychiatric: Anxious mood, appropriate affect  Laboratory: Most recent CBC Lab Results  Component Value Date   WBC 9.0 05/26/2022   HGB 10.0 (L) 05/26/2022   HCT 29.6 (L) 05/26/2022   MCV 91.9 05/26/2022   PLT 218 05/26/2022   Most recent BMP    Latest Ref Rng & Units 05/27/2022    2:48 AM  BMP  Glucose 70 - 99 mg/dL 111   BUN 8 - 23 mg/dL 12   Creatinine 0.44 - 1.00 mg/dL 1.24   Sodium 135 - 145 mmol/L 135   Potassium 3.5 - 5.1 mmol/L 3.5   Chloride 98 - 111 mmol/L 97   CO2 22 - 32 mmol/L 31   Calcium 8.9 - 10.3 mg/dL 8.0    Lisa Hal, MD  05/27/2022, 12:18 PM PGY-1, [place] Intern pager: [phone], text pages welcome Secure chat group [place]

## 2022-05-27 NOTE — Progress Notes (Signed)
Mobility Specialist Progress Note:   05/27/22 1500  Mobility  Activity Ambulated with assistance in room  Level of Assistance Contact guard assist, steadying assist  Assistive Device Front wheel walker  Distance Ambulated (ft) 50 ft  Activity Response Tolerated well  Mobility Referral Yes  $Mobility charge 1 Mobility   Pt eager for mobility session. Required minA to get EOB, CGA with ambulation for safety. VSS on RA. Pt back in bed with all needs met, bed alarm on.   [place] Numbers Acute Rehab Secure Chat or Office Phone: [phone]

## 2022-05-27 NOTE — Progress Notes (Signed)
Physical Therapy Treatment Patient Details Name: Lisa [name] MRN: [id number] DOB: 1941/01/14 Today's Date: 05/27/2022   History of Present Illness 81 yo female presents to [facility] on 10/12 with dizziness, increased LE swelling from stopping "fluid pill" x1 week ago, and ICD firings. Workup for sepsis PNA, VF storm. s/p R/L heart cath on 10/16. 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    PT Comments    Pt received in supine, agreeable to therapy session with encouragement, pt refusing OOB/EOB mobility due to fatigue after working with mobility specialist. Pt agreeable to bed-level exercises and supine<>long sitting transfer training with encouragement, needing up to minA to reach upright long sitting. Pt desat to 87% with exertional tasks in supine (with HOB flat) and needed 1L O2 [place] to maintain >90%  with supine exercises. Pt given handout to reinforce HEP, performed as detailed below with good tolerance and mod cues for technique. Pt continues to benefit from PT services to progress toward functional mobility goals. Plan to bring bariatric rollator next session for pt to trial for energy conservation.   Recommendations for follow up therapy are one component of a multi-disciplinary discharge planning process, led by the attending physician.  Recommendations may be updated based on patient status, additional functional criteria and insurance authorization.  Follow Up Recommendations  Skilled nursing-short term rehab (<3 hours/day) Can patient physically be transported by private vehicle: No   Assistance Recommended at Discharge Frequent or constant Supervision/Assistance  Patient can return home with the following A little help with walking and/or transfers;A little help with bathing/dressing/bathroom;Help with stairs or ramp for entrance   Equipment Recommendations  Other (comment) (TBD, pt  interested in rollator, defer to post-acute)    Recommendations for Other Services       Precautions / Restrictions Precautions Precautions: Fall Precaution Comments: monitor O2, normally uses O2 at night only Restrictions Weight Bearing Restrictions: No     Mobility  Bed Mobility Overal bed mobility: Needs Assistance Bed Mobility: Supine to Sit     Supine to sit: Min assist     General bed mobility comments: pt able to adjust HoB up to decrease need for elevation of her trunk, then performing supine to long sitting "crunches" in elevated bed x10 reps, up to minA for transfer to upright, pt defer EOB/OOB despite encouragement    Transfers                   General transfer comment: pt defers    Ambulation/Gait               General Gait Details: pt defers   Stairs             Wheelchair Mobility    Modified Rankin (Stroke Patients Only)       Balance Overall balance assessment: Needs assistance Sitting-balance support: Feet supported, Bilateral upper extremity supported Sitting balance-Leahy Scale: Fair Sitting balance - Comments: in long sitting in bed, reliant on BUE support of bed rails       Standing balance comment: pt defers                            Cognition Arousal/Alertness: Awake/alert Behavior During Therapy: WFL for tasks assessed/performed Overall Cognitive Status: Within Functional Limits for tasks assessed  General Comments: self-limiting due to fatigue after sitting up in chair and working with mobility specialist, agreeable to bed-level session for exercise instruction        Exercises General Exercises - Lower Extremity Ankle Circles/Pumps: AROM, Both, 10 reps, Supine Quad Sets: AROM, Both, 5 reps, Supine Gluteal Sets: AROM, Both, 5 reps, Supine Short Arc Quad: AROM, Both, 10 reps, Supine Heel Slides: AROM, Both, 10 reps, Supine Hip  ABduction/ADduction: AROM, Both, 10 reps, Supine Straight Leg Raises: AROM, AAROM, Both, 10 reps, Supine Other Exercises Other Exercises: supine BUE AROM: shoulder flexion x5 reps with deep breaths Other Exercises: bed "crunches" in chair posture using B rails x10 reps cues for core activation/breathing    General Comments General comments (skin integrity, edema, etc.): BP 145/48, SpO2 97% on 1L O2 [name] with supine exercises; desat to 87-89% during supine exercises so kept 1L on during all exercise.      Pertinent Vitals/Pain Pain Assessment Pain Assessment: Faces Faces Pain Scale: Hurts a little bit Pain Location: BLE, R hip Pain Descriptors / Indicators: Discomfort, Sore Pain Intervention(s): Monitored during session, Limited activity within patient's tolerance, Repositioned     PT Goals (current goals can now be found in the care plan section) Acute Rehab PT Goals Patient Stated Goal: to get stronger PT Goal Formulation: With patient Time For Goal Achievement: 06/05/22 Progress towards PT goals: Progressing toward goals    Frequency    Min 3X/week      PT Plan Current plan remains appropriate       AM-PAC PT "6 Clicks" Mobility   Outcome Measure  Help needed turning from your back to your side while in a flat bed without using bedrails?: A Little Help needed moving from lying on your back to sitting on the side of a flat bed without using bedrails?: A Lot (based on supine<>long sitting, anticipate mod cues needed) Help needed moving to and from a bed to a chair (including a wheelchair)?: A Little Help needed standing up from a chair using your arms (e.g., wheelchair or bedside chair)?: A Little Help needed to walk in hospital room?: Total (pt reports she is too fatigued) Help needed climbing 3-5 steps with a railing? : Total (pt reports too fatigued) 6 Click Score: 13    End of Session Equipment Utilized During Treatment: Oxygen Activity Tolerance: Patient limited  by fatigue Patient left: in bed;with call bell/phone within reach;with bed alarm set Nurse Communication: Mobility status;Other (comment) (needs purewick emptied) PT Visit Diagnosis: Other abnormalities of gait and mobility (R26.89);Muscle weakness (generalized) (M62.81)     Time: 1627-1700 PT Time Calculation (min) (ACUTE ONLY): 33 min  Charges:  $Therapeutic Exercise: 23-37 mins                     Ananya Mccleese P., PTA Acute Rehabilitation Services Secure Chat Preferred 9a-5:30pm Office: [place] 05/27/2022, 5:53 PM

## 2022-05-28 DIAGNOSIS — J189 Pneumonia, unspecified organism: Secondary | ICD-10-CM | POA: Diagnosis not present

## 2022-05-28 DIAGNOSIS — I472 Ventricular tachycardia, unspecified: Secondary | ICD-10-CM

## 2022-05-28 DIAGNOSIS — C9210 Chronic myeloid leukemia, BCR/ABL-positive, not having achieved remission: Secondary | ICD-10-CM | POA: Diagnosis not present

## 2022-05-28 DIAGNOSIS — I890 Lymphedema, not elsewhere classified: Secondary | ICD-10-CM | POA: Diagnosis not present

## 2022-05-28 LAB — BASIC METABOLIC PANEL
Anion gap: 5 (ref 5–15)
BUN: 14 mg/dL (ref 8–23)
CO2: 33 mmol/L — ABNORMAL HIGH (ref 22–32)
Calcium: 7.7 mg/dL — ABNORMAL LOW (ref 8.9–10.3)
Chloride: 96 mmol/L — ABNORMAL LOW (ref 98–111)
Creatinine, Ser: 1.22 mg/dL — ABNORMAL HIGH (ref 0.44–1.00)
GFR, Estimated: 45 mL/min — ABNORMAL LOW (ref >60)
Glucose, Bld: 109 mg/dL — ABNORMAL HIGH (ref 70–99)
Potassium: 3.3 mmol/L — ABNORMAL LOW (ref 3.5–5.1)
Sodium: 134 mmol/L — ABNORMAL LOW (ref 135–145)

## 2022-05-28 LAB — MAGNESIUM: Magnesium: 1.9 mg/dL (ref 1.7–2.4)

## 2022-05-28 MED ORDER — POTASSIUM CHLORIDE CRYS ER 20 MEQ PO TBCR
40.0000 meq | EXTENDED_RELEASE_TABLET | Freq: Once | ORAL | Status: AC
  Administered 2022-05-28: 40 meq via ORAL
  Filled 2022-05-28: qty 2

## 2022-05-28 MED ORDER — MEXILETINE HCL 250 MG PO CAPS: 250.0000 mg | ORAL_CAPSULE | Freq: Two times a day (BID) | ORAL | 0 refills | Status: DC

## 2022-05-28 MED ORDER — MAGNESIUM SULFATE IN D5W 1-5 GM/100ML-% IV SOLN
1.0000 g | Freq: Once | INTRAVENOUS | Status: AC
  Administered 2022-05-28: 1 g via INTRAVENOUS
  Filled 2022-05-28: qty 100

## 2022-05-28 MED ORDER — TRIAMCINOLONE ACETONIDE 0.1 % EX CREA: TOPICAL_CREAM | Freq: Two times a day (BID) | CUTANEOUS | 0 refills | Status: AC

## 2022-05-28 MED ORDER — AMIODARONE HCL 400 MG PO TABS: 400.0000 mg | ORAL_TABLET | Freq: Every day | ORAL | 0 refills | Status: DC

## 2022-05-28 NOTE — Discharge Summary (Addendum)
[facility] Discharge Summary  Patient name: Lisa [name] record number: [id number] Date of birth: July 12, 1941 Age: 81 y.o. Gender: [name] Date of Admission: 05/21/2022  Date of Discharge: 05/28/2022 Admitting Physician: Jacelyn Grip, MD  Primary Care Provider: Patient, No Pcp Per Consultants: Cardiology  Indication for Hospitalization: Sepsis due to pneumonia with VF runs on ICD  Brief Hospital Course:  Lisa [name] is a 81 y.o. [name] who presented with SOB, dizziness, and cough and found to have pneumonia and multiple runs of VF shocked by ICD. 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 A brief hospital course is below.   Sepsis due to pneumonia [facility]) Presented with SOB, dizziness, and cough. She was febrile, tachypneic, and required 0.25L [place]. Expiratory wheezes and crackles heard on exam. CXR suggestive of pneumonia. She was given rocephin and azithromycin. Azithromycin was transitioned to PO, and rocephin was switched to PO cefdinir with improvement (completed on 10/16). By discharge, she was breathing well on RA.   Ventricular fibrillation [facility]) Presented with interrogation findings of multiple runs of VF with successful shocks. Suspect acute worsening of VF episodes d/t pneumonia. Cardiology consulted in ED; no ischemic workup required, though amiodarone drip and PO mexiletine started. Was transitioned to PO amiodarone 400 mg BID with last dose 10/19, then daily thereafter starting 10/20. By discharge, patient had no further runs of VF on cardiac monitoring, mexiletine was d/c, and amiodarone was transitioned to PO until cardiology follow up.   Lymphedema Presented with acute on chronic lymphedema. Bilateral venous stasis dermatitis noted. Wound care nurse consulted; recommendations followed through admission. IV lasix  continued until increase skin turgor in lower limbs as which point home PO torsemide was restarted. Unna boots was tried for persistent discomfort without improvement. Given continued symptoms, trialed triamcinolone 0.1% BID to area. By discharge, her symptoms had improved.  Other chronic conditions: CML: follows with [facility] for oral chemo; imatinib continued HTN: Coreg held per cardiology; diuresis continued as above CHF: likely overloaded d/t missing torsemide at home; improved on diuretics as above  Issues for follow up: Ensure administration of ONE last dose of MEXILETINE TONIGHT 10/19. Then only amiodarone 400 mg daily thereafter under cardiology follow up. Ensure respiratory status has continued to improve Ensure cardiology follow up on 06/29/2022 Follow up improvement/stability of lymphedema and stasis dermatitis Recommend BMP in 1 week (required repletion of K in hospital)  Discharge Diagnoses/Problem List:  Principal Problem for Admission:  Present on Admission:  Essential hypertension  Ventricular fibrillation ([place])  Chronic systolic heart failure (HCC)  Venous stasis dermatitis of both lower extremities  Disposition: SNF  Discharge Condition: Stable  Discharge Exam:  Blood pressure (!) 127/48, pulse 83, temperature 98.3 F (36.8 C), temperature source Oral, resp. rate 15, height '5\' 2"'$  (1.575 m), weight 115.3 kg, last menstrual period 02/14/1995, SpO2 94 %.  General: Alert and oriented, in NAD Skin: Warm, dry; venous stasis dermatitis on BLE with dried serous fluid HEENT: NCAT, EOM grossly normal, midline nasal septum Cardiac: RRR, 2/6 systolic murmur appreciated Respiratory: CTAB, breathing and speaking comfortably on RA Extremities: Moves all extremities grossly equally in bed Neurological: No gross focal deficit Psychiatric: Appropriate mood and affect   Significant Labs and Imaging:  No results for input(s): "WBC", "HGB", "HCT", "PLT" in the last 48 hours.  Recent  Labs  Lab 05/26/22 1432 05/27/22 0248 05/28/22  0233  NA 135 135 134*  K 3.9 3.5 3.3*  CL 98 97* 96*  CO2 29 31 33*  GLUCOSE 138* 111* 109*  BUN '12 12 14  '$ CREATININE 1.37* 1.24* 1.22*  CALCIUM 7.9* 8.0* 7.7*  MG  --   --  1.9   CXR IMPRESSION: New left pleural effusion and increased left greater than right basilar pulmonary opacities which may reflect atelectasis or pneumonia. Chronic vascular congestion.  Discharge Medications:  Allergies as of 05/28/2022       Reactions   Flagyl [metronidazole] Itching, Rash, Other (See Comments)   Welts, also   Penicillins Hives, Itching, Rash   Has patient had a PCN reaction causing immediate rash, facial/tongue/throat swelling, SOB or lightheadedness with hypotension: Yes  Has patient had a PCN reaction causing severe rash involving mucus membranes or skin necrosis: Yes Has patient had a PCN reaction that required hospitalization No Has patient had a PCN reaction occurring within the last 10 years: NO If all of the above answers are "NO", then may proceed with Cephalosporin use.   Sulfamethoxazole Rash   Meperidine Nausea And Vomiting   Morphine And Related Nausea And Vomiting   Propoxyphene    Other reaction(s): Unknown   Tape Other (See Comments)   "THE PLASTIC, CLEAR TAPE CAN/DOES PULL OFF MY SKIN."   Doxycycline Nausea Only, Rash, Other (See Comments)   Made her "feel terrible"   Lanolin Itching, Rash   Sulfa Antibiotics Rash        Medication List     STOP taking these medications    carvedilol 25 MG tablet Commonly known as: COREG   triamcinolone ointment 0.1 % Commonly known as: KENALOG Replaced by: triamcinolone cream 0.1 %       TAKE these medications    acetaminophen 500 MG tablet Commonly known as: TYLENOL Take 500-1,000 mg by mouth every 6 (six) hours as needed for moderate pain.   allopurinol 100 MG tablet Commonly known as: ZYLOPRIM Take 100 mg by mouth daily.   aluminum hydroxide-magnesium  carbonate 95-358 MG/15ML Susp Commonly known as: GAVISCON Take 15 mLs by mouth 3 (three) times daily after meals.   amiodarone 400 MG tablet Commonly known as: PACERONE Take 1 tablet (400 mg total) by mouth daily. Take daily until cardiology follow up appointment. What changed:  medication strength how much to take additional instructions   ascorbic acid 500 MG tablet Commonly known as: VITAMIN C Take 500 mg by mouth daily.   aspirin EC 81 MG tablet Take 81 mg by mouth at bedtime.   atorvastatin 20 MG tablet Commonly known as: LIPITOR Take 1 tablet (20 mg total) by mouth daily.   b complex vitamins capsule Take 1 capsule by mouth daily.   calcium carbonate 1250 (500 Ca) MG tablet Commonly known as: OS-CAL - dosed in mg of elemental calcium Take 1 tablet by mouth.   ferrous sulfate 325 (65 FE) MG EC tablet Take 1 tablet by mouth 3 (three) times a week.   imatinib 400 MG tablet Commonly known as: GLEEVEC Take 400 mg by mouth at bedtime.   Loperamide HCl 1 MG/7.5ML Liqd Take by mouth as needed.   Magnesium Oxide 400 MG Caps Take 1 capsule (400 mg total) by mouth 2 (two) times daily.   METAMUCIL CLEAR & NATURAL PO Take by mouth See admin instructions. Mix 1 teaspoonful into 8 ounces of water and drink as needed   mexiletine 250 MG capsule Commonly known as: MEXITIL Take 1  capsule (250 mg total) by mouth every 12 (twelve) hours for 1 day.   multivitamin with minerals Tabs tablet Take 1 tablet by mouth daily.   nitroGLYCERIN 0.4 MG SL tablet Commonly known as: NITROSTAT Place 1 tablet (0.4 mg total) under the tongue every 5 (five) minutes x 3 doses as needed for chest pain.   potassium chloride 10 MEQ tablet Commonly known as: KLOR-CON M Take 10 mEq by mouth as directed. Take with Torsemide   thyroid 15 MG tablet Commonly known as: ARMOUR Take 15 mg by mouth daily before breakfast.   torsemide 20 MG tablet Commonly known as: DEMADEX Take 1 tablet (20 mg  total) by mouth as directed.   triamcinolone cream 0.1 % Commonly known as: KENALOG Apply topically 2 (two) times daily for 3 days. Replaces: triamcinolone ointment 0.1 %   vitamin B-12 500 MCG tablet Commonly known as: CYANOCOBALAMIN Take 500 mcg by mouth daily.   VITAMIN D-3 PO Take 5,000 Units by mouth daily.       Discharge Instructions: Please refer to Patient Instructions section of EMR for full details.  Patient was counseled important signs and symptoms that should prompt return to medical care, changes in medications, dietary instructions, activity restrictions, and follow up appointments.   Follow-Up Appointments:   Ethelene Hal, MD 05/28/2022, 11:09 AM PGY-1, [facility]   Upper Level Addendum:  I have seen and evaluated this patient along with Dr. Marcha Dutton and reviewed the above note, making necessary revisions as appropriate.  I agree with the medical decision making and physical exam as noted above.  Gerrit Heck, MD PGY-2 [facility] Family [facility] Residency

## 2022-05-28 NOTE — Progress Notes (Signed)
Mobility Specialist Progress Note:   05/28/22 1120  Mobility  Activity Ambulated with assistance in room  Level of Assistance Contact guard assist, steadying assist  Assistive Device Front wheel walker  Distance Ambulated (ft) 40 ft  Activity Response Tolerated well  Mobility Referral Yes  $Mobility charge 1 Mobility   Pt presenting with decr endurance this am. Required only minG assist during ambulation. Pt became lightheaded during gait, SpO2 89% on RA once seated and able to obtain reading. Pt left in chair at sink, NT in to give bath.   Nelta Numbers Acute Rehab Secure Chat or Office Phone: [phone]

## 2022-05-28 NOTE — Progress Notes (Signed)
     Daily Progress Note Intern Pager: [phone]  Patient name: Lisa [name] Medical record number: [id number] Date of birth: 26-Nov-1940 Age: 81 y.o. Gender: female  Primary Care Provider: Patient, No Pcp Per Consultants: Cardiology (EP) Code Status: Full   Pt Overview and Major Events to Date:  10/12-admitted   Assessment and Plan:   81 year old female who presented with multiple VF episodes with ICD shocks likely in the setting of pneumonia meeting sepsis criteria. 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   * Sepsis due to pneumonia ([place]) Resolved. Now on RA. Bcx NGTD. - now s/p azithro and cefdinir course - Mucinex as needed for phlegm - Flutter valve  Ventricular fibrillation ([place]) Stable, EP signed off.  Appreciate recommendations. -Continue PO amiodarone 400 mg BID with last dose 10/19, then daily thereafter starting 10/20 -Continue mexilitine with last dose 10/19 with completion of amio loading -Replete lytes as necessary to keep K>4 and Mg>2  Lymphedema Chronic, stable. Stasis dermatitis +/- lipodermatosclerosis likely playing role in pain in that leg. Given increased skin turgor, will transition back to home torsemide 20 mg.  -Continue home torsemide 20 mg -Triamcinolone 0.1% BID to affected area for 3 more days for stasis dermatitis -D/c'd unna boots given no significant improvement  CML (chronic myelocytic leukemia) (HCC) Chronic, stable. - Continue imatinib  Essential hypertension BP soft. -Holding carvedilol -Continue diuresis -Monitor BP  Stage 3b chronic kidney disease (CKD) (HCC) Around baseline, Cr 1.2-1.3. -Monitor while diuresing with daily BMP  Macrocytic anemia Hgb stable at 10 yesterday.  Chronic systolic heart failure (HCC) HFmrEF. - continue PO home diuresis - strict I/O, daily weights   FEN/GI: Regular diet PPx: Enoxaparin Dispo:SNF most  likely today 10/19 given medical stability  Subjective:  Doing well this morning without concerns. She is ready for discharge to SNF today.  Objective: Temp:  [97.6 F (36.4 C)-98.3 F (36.8 C)] 98.3 F (36.8 C) (10/19 0756) Pulse Rate:  [79-100] 83 (10/19 0355) Resp:  [13-31] 15 (10/19 0756) BP: (108-145)/(41-48) 127/48 (10/19 0756) SpO2:  [73 %-99 %] 94 % (10/19 0756) Weight:  [115.3 kg] 115.3 kg (10/19 0500) Physical Exam: General: Alert and oriented, in NAD Skin: Warm, dry; venous stasis dermatitis on BLE with dried serous fluid HEENT: NCAT, EOM grossly normal, midline nasal septum Cardiac: RRR, 2/6 systolic murmur appreciated Respiratory: CTAB, breathing and speaking comfortably on RA Extremities: Moves all extremities grossly equally in bed Neurological: No gross focal deficit Psychiatric: Appropriate mood and affect   Laboratory: Most recent CBC Lab Results  Component Value Date   WBC 9.0 05/26/2022   HGB 10.0 (L) 05/26/2022   HCT 29.6 (L) 05/26/2022   MCV 91.9 05/26/2022   PLT 218 05/26/2022   Most recent BMP    Latest Ref Rng & Units 05/28/2022    2:33 AM  BMP  Glucose 70 - 99 mg/dL 109   BUN 8 - 23 mg/dL 14   Creatinine 0.44 - 1.00 mg/dL 1.22   Sodium 135 - 145 mmol/L 134   Potassium 3.5 - 5.1 mmol/L 3.3   Chloride 98 - 111 mmol/L 96   CO2 22 - 32 mmol/L 33   Calcium 8.9 - 10.3 mg/dL 7.7    Mg 1.9  Ethelene Hal, MD 05/28/2022, 8:05 AM PGY-1, [place] Intern pager: [phone], text pages welcome Secure chat group [place]

## 2022-05-28 NOTE — TOC Transition Note (Signed)
Transition of Care [facility]) - CM/SW Discharge Note   Patient Details  Name: Lisa [name] MRN: [id number] Date of Birth: 10-04-1940  [facility]) CM/SW Contact:  Benard Halsted, LCSW Phone Number: 05/28/2022, 12:26 PM   Clinical Narrative:    Patient will DC to: [place] date: 05/28/22 Family notified: Daughter, Larene [place] Transport by: Corey Harold   Per MD patient ready for DC to [facility]. RN to call report prior to discharge [phone] room [id number]). RN, patient, patient's family, and facility notified of DC. Discharge Summary and FL2 sent to facility. DC packet on chart. Ambulance transport requested for patient.   CSW will sign off for now as social work intervention is no longer needed. Please consult [place] again if new needs arise.     Final next level of care: Skilled Nursing Facility Barriers to Discharge: Barriers Resolved   Patient Goals and CMS Choice Patient states their goals for this hospitalization and ongoing recovery are:: Rehab CMS Medicare.gov Compare Post Acute Care list provided to:: Patient Choice offered to / list presented to : Patient, Adult Children  Discharge Placement   Existing PASRR number confirmed : 05/28/22          Patient chooses bed at: [facility] Patient to be transferred to facility by: [place] Name of family member notified: Daughter Patient and family notified of of transfer: 05/28/22  Discharge Plan and Services   Discharge Planning Services: CM Consult                                 Social Determinants of Health ([place]) Interventions Housing Interventions: Intervention Not Indicated   Readmission Risk Interventions     No data to display

## 2022-05-28 NOTE — TOC Progression Note (Signed)
Transition of Care [facility]) - Progression Note    Patient Details  Name: Lisa [name] MRN: [id number] Date of Birth: 1941-02-21  Transition of Care [facility]) CM/SW [place], LCSW Phone Number: 05/28/2022, 12:17 PM  Clinical Narrative:    [facility] ready for patient. CSW updated patient's daughter and provided her with contact info for agencies that assist with IL/ALF placement if needed.      Barriers to Discharge: Continued Medical Work up  Expected Discharge Plan and Services     Discharge Planning Services: CM Consult   Living arrangements for the past 2 months: Single Family Home Expected Discharge Date: 05/28/22                                     Social Determinants of Health (SDOH) Interventions Housing Interventions: Intervention Not Indicated  Readmission Risk Interventions     No data to display

## 2022-05-30 ENCOUNTER — Encounter: Payer: Self-pay | Admitting: Internal Medicine

## 2022-06-02 NOTE — Progress Notes (Signed)
Severe sepsis is resolved patient on room air and no longer febrile with normal vital signs at discharge

## 2022-06-10 NOTE — Progress Notes (Signed)
No ICM remote transmission received for 06/08/2022 and next ICM transmission scheduled for 07/13/2022.

## 2022-06-16 ENCOUNTER — Other Ambulatory Visit: Payer: Self-pay

## 2022-06-16 ENCOUNTER — Inpatient Hospital Stay (HOSPITAL_COMMUNITY)
Admission: EM | Admit: 2022-06-16 | Discharge: 2022-06-19 | DRG: 871 | Disposition: A | Discharge status: Home / Self Care | Payer: Medicare Other | Attending: Family Medicine | Admitting: Family Medicine

## 2022-06-16 ENCOUNTER — Emergency Department (HOSPITAL_COMMUNITY): Payer: Medicare Other

## 2022-06-16 ENCOUNTER — Encounter (HOSPITAL_COMMUNITY): Payer: Self-pay | Admitting: Emergency Medicine

## 2022-06-16 DIAGNOSIS — B952 Enterococcus as the cause of diseases classified elsewhere: Secondary | ICD-10-CM | POA: Diagnosis present

## 2022-06-16 DIAGNOSIS — I10 Essential (primary) hypertension: Secondary | ICD-10-CM | POA: Diagnosis present

## 2022-06-16 DIAGNOSIS — R7881 Bacteremia: Secondary | ICD-10-CM

## 2022-06-16 DIAGNOSIS — I700 Atherosclerosis of aorta: Secondary | ICD-10-CM | POA: Diagnosis present

## 2022-06-16 DIAGNOSIS — N1832 Chronic kidney disease, stage 3b: Secondary | ICD-10-CM | POA: Diagnosis present

## 2022-06-16 DIAGNOSIS — Z20822 Contact with and (suspected) exposure to covid-19: Secondary | ICD-10-CM | POA: Diagnosis present

## 2022-06-16 DIAGNOSIS — N390 Urinary tract infection, site not specified: Secondary | ICD-10-CM | POA: Diagnosis present

## 2022-06-16 DIAGNOSIS — E039 Hypothyroidism, unspecified: Secondary | ICD-10-CM | POA: Diagnosis present

## 2022-06-16 DIAGNOSIS — I890 Lymphedema, not elsewhere classified: Secondary | ICD-10-CM | POA: Diagnosis present

## 2022-06-16 DIAGNOSIS — I2510 Atherosclerotic heart disease of native coronary artery without angina pectoris: Secondary | ICD-10-CM | POA: Diagnosis present

## 2022-06-16 DIAGNOSIS — Z888 Allergy status to other drugs, medicaments and biological substances status: Secondary | ICD-10-CM

## 2022-06-16 DIAGNOSIS — I872 Venous insufficiency (chronic) (peripheral): Secondary | ICD-10-CM

## 2022-06-16 DIAGNOSIS — Z955 Presence of coronary angioplasty implant and graft: Secondary | ICD-10-CM

## 2022-06-16 DIAGNOSIS — R6520 Severe sepsis without septic shock: Secondary | ICD-10-CM | POA: Diagnosis present

## 2022-06-16 DIAGNOSIS — B9789 Other viral agents as the cause of diseases classified elsewhere: Secondary | ICD-10-CM | POA: Diagnosis present

## 2022-06-16 DIAGNOSIS — A4152 Sepsis due to Pseudomonas: Secondary | ICD-10-CM | POA: Diagnosis not present

## 2022-06-16 DIAGNOSIS — J189 Pneumonia, unspecified organism: Secondary | ICD-10-CM | POA: Diagnosis present

## 2022-06-16 DIAGNOSIS — B965 Pseudomonas (aeruginosa) (mallei) (pseudomallei) as the cause of diseases classified elsewhere: Secondary | ICD-10-CM

## 2022-06-16 DIAGNOSIS — Z91018 Allergy to other foods: Secondary | ICD-10-CM

## 2022-06-16 DIAGNOSIS — R112 Nausea with vomiting, unspecified: Secondary | ICD-10-CM | POA: Diagnosis not present

## 2022-06-16 DIAGNOSIS — I447 Left bundle-branch block, unspecified: Secondary | ICD-10-CM | POA: Diagnosis present

## 2022-06-16 DIAGNOSIS — B9710 Unspecified enterovirus as the cause of diseases classified elsewhere: Secondary | ICD-10-CM | POA: Diagnosis present

## 2022-06-16 DIAGNOSIS — R8279 Other abnormal findings on microbiological examination of urine: Secondary | ICD-10-CM

## 2022-06-16 DIAGNOSIS — K30 Functional dyspepsia: Secondary | ICD-10-CM | POA: Diagnosis not present

## 2022-06-16 DIAGNOSIS — Z8249 Family history of ischemic heart disease and other diseases of the circulatory system: Secondary | ICD-10-CM

## 2022-06-16 DIAGNOSIS — A084 Viral intestinal infection, unspecified: Secondary | ICD-10-CM

## 2022-06-16 DIAGNOSIS — Z6841 Body Mass Index (BMI) 40.0 and over, adult: Secondary | ICD-10-CM

## 2022-06-16 DIAGNOSIS — Z8349 Family history of other endocrine, nutritional and metabolic diseases: Secondary | ICD-10-CM

## 2022-06-16 DIAGNOSIS — A0839 Other viral enteritis: Secondary | ICD-10-CM | POA: Diagnosis present

## 2022-06-16 DIAGNOSIS — Z803 Family history of malignant neoplasm of breast: Secondary | ICD-10-CM

## 2022-06-16 DIAGNOSIS — Z881 Allergy status to other antibiotic agents status: Secondary | ICD-10-CM

## 2022-06-16 DIAGNOSIS — Z885 Allergy status to narcotic agent status: Secondary | ICD-10-CM

## 2022-06-16 DIAGNOSIS — Z882 Allergy status to sulfonamides status: Secondary | ICD-10-CM

## 2022-06-16 DIAGNOSIS — I4901 Ventricular fibrillation: Secondary | ICD-10-CM | POA: Diagnosis present

## 2022-06-16 DIAGNOSIS — D3502 Benign neoplasm of left adrenal gland: Secondary | ICD-10-CM | POA: Diagnosis present

## 2022-06-16 DIAGNOSIS — D539 Nutritional anemia, unspecified: Secondary | ICD-10-CM | POA: Diagnosis present

## 2022-06-16 DIAGNOSIS — I5022 Chronic systolic (congestive) heart failure: Secondary | ICD-10-CM | POA: Diagnosis present

## 2022-06-16 DIAGNOSIS — Z8674 Personal history of sudden cardiac arrest: Secondary | ICD-10-CM

## 2022-06-16 DIAGNOSIS — B9620 Unspecified Escherichia coli [E. coli] as the cause of diseases classified elsewhere: Secondary | ICD-10-CM | POA: Diagnosis present

## 2022-06-16 DIAGNOSIS — R6510 Systemic inflammatory response syndrome (SIRS) of non-infectious origin without acute organ dysfunction: Secondary | ICD-10-CM | POA: Diagnosis present

## 2022-06-16 DIAGNOSIS — Z90710 Acquired absence of both cervix and uterus: Secondary | ICD-10-CM

## 2022-06-16 DIAGNOSIS — Z85820 Personal history of malignant melanoma of skin: Secondary | ICD-10-CM

## 2022-06-16 DIAGNOSIS — E038 Other specified hypothyroidism: Secondary | ICD-10-CM | POA: Diagnosis present

## 2022-06-16 DIAGNOSIS — E872 Acidosis, unspecified: Secondary | ICD-10-CM | POA: Diagnosis present

## 2022-06-16 DIAGNOSIS — N179 Acute kidney failure, unspecified: Secondary | ICD-10-CM | POA: Diagnosis present

## 2022-06-16 DIAGNOSIS — Z807 Family history of other malignant neoplasms of lymphoid, hematopoietic and related tissues: Secondary | ICD-10-CM

## 2022-06-16 DIAGNOSIS — C9210 Chronic myeloid leukemia, BCR/ABL-positive, not having achieved remission: Secondary | ICD-10-CM | POA: Diagnosis present

## 2022-06-16 DIAGNOSIS — I4729 Other ventricular tachycardia: Secondary | ICD-10-CM | POA: Diagnosis present

## 2022-06-16 DIAGNOSIS — R7401 Elevation of levels of liver transaminase levels: Secondary | ICD-10-CM | POA: Diagnosis present

## 2022-06-16 DIAGNOSIS — Z79899 Other long term (current) drug therapy: Secondary | ICD-10-CM

## 2022-06-16 DIAGNOSIS — Z880 Allergy status to penicillin: Secondary | ICD-10-CM

## 2022-06-16 DIAGNOSIS — Z7982 Long term (current) use of aspirin: Secondary | ICD-10-CM

## 2022-06-16 DIAGNOSIS — I110 Hypertensive heart disease with heart failure: Secondary | ICD-10-CM | POA: Diagnosis present

## 2022-06-16 DIAGNOSIS — Z8701 Personal history of pneumonia (recurrent): Secondary | ICD-10-CM

## 2022-06-16 DIAGNOSIS — D849 Immunodeficiency, unspecified: Secondary | ICD-10-CM | POA: Diagnosis present

## 2022-06-16 DIAGNOSIS — I472 Ventricular tachycardia, unspecified: Secondary | ICD-10-CM | POA: Diagnosis present

## 2022-06-16 DIAGNOSIS — I493 Ventricular premature depolarization: Secondary | ICD-10-CM | POA: Diagnosis present

## 2022-06-16 DIAGNOSIS — Z95810 Presence of automatic (implantable) cardiac defibrillator: Secondary | ICD-10-CM

## 2022-06-16 DIAGNOSIS — Z79890 Hormone replacement therapy (postmenopausal): Secondary | ICD-10-CM

## 2022-06-16 DIAGNOSIS — R509 Fever, unspecified: Secondary | ICD-10-CM

## 2022-06-16 HISTORY — DX: Pseudomonas (aeruginosa) (mallei) (pseudomallei) as the cause of diseases classified elsewhere: B96.5

## 2022-06-16 HISTORY — DX: Bacteremia: R78.81

## 2022-06-16 LAB — CBC WITH DIFFERENTIAL/PLATELET
Abs Immature Granulocytes: 0.07 10*3/uL (ref 0.00–0.07)
Basophils Absolute: 0 10*3/uL (ref 0.0–0.1)
Basophils Relative: 0 %
Eosinophils Absolute: 0 10*3/uL (ref 0.0–0.5)
Eosinophils Relative: 0 %
HCT: 29.6 % — ABNORMAL LOW (ref 36.0–46.0)
Hemoglobin: 9.7 g/dL — ABNORMAL LOW (ref 12.0–15.0)
Immature Granulocytes: 1 %
Lymphocytes Relative: 3 %
Lymphs Abs: 0.3 10*3/uL — ABNORMAL LOW (ref 0.7–4.0)
MCH: 31.6 pg (ref 26.0–34.0)
MCHC: 32.8 g/dL (ref 30.0–36.0)
MCV: 96.4 fL (ref 80.0–100.0)
Monocytes Absolute: 0.6 10*3/uL (ref 0.1–1.0)
Monocytes Relative: 5 %
Neutro Abs: 10.6 10*3/uL — ABNORMAL HIGH (ref 1.7–7.7)
Neutrophils Relative %: 91 %
Platelets: 248 10*3/uL (ref 150–400)
RBC: 3.07 MIL/uL — ABNORMAL LOW (ref 3.87–5.11)
RDW: 16.1 % — ABNORMAL HIGH (ref 11.5–15.5)
WBC: 11.5 10*3/uL — ABNORMAL HIGH (ref 4.0–10.5)
nRBC: 0 % (ref 0.0–0.2)

## 2022-06-16 LAB — PROTIME-INR
INR: 1.4 — ABNORMAL HIGH (ref 0.8–1.2)
Prothrombin Time: 17.2 seconds — ABNORMAL HIGH (ref 11.4–15.2)

## 2022-06-16 LAB — LIPASE, BLOOD: Lipase: 48 U/L (ref 11–51)

## 2022-06-16 LAB — LACTIC ACID, PLASMA
Lactic Acid, Venous: 3.5 mmol/L (ref 0.5–1.9)
Lactic Acid, Venous: 3.1 mmol/L (ref 0.5–1.9)
Lactic Acid, Venous: 3.3 mmol/L (ref 0.5–1.9)
Lactic Acid, Venous: 1.7 mmol/L (ref 0.5–1.9)

## 2022-06-16 LAB — RESPIRATORY PANEL BY PCR

## 2022-06-16 LAB — CBG MONITORING, ED: Glucose-Capillary: 134 mg/dL — ABNORMAL HIGH (ref 70–99)

## 2022-06-16 LAB — APTT: aPTT: 20 seconds — ABNORMAL LOW (ref 24–36)

## 2022-06-16 LAB — COMPREHENSIVE METABOLIC PANEL
ALT: 55 U/L — ABNORMAL HIGH (ref 0–44)
AST: 60 U/L — ABNORMAL HIGH (ref 15–41)
Albumin: 3.2 g/dL — ABNORMAL LOW (ref 3.5–5.0)
Alkaline Phosphatase: 90 U/L (ref 38–126)
Anion gap: 10 (ref 5–15)
BUN: 25 mg/dL — ABNORMAL HIGH (ref 8–23)
CO2: 25 mmol/L (ref 22–32)
Calcium: 8.9 mg/dL (ref 8.9–10.3)
Chloride: 104 mmol/L (ref 98–111)
Creatinine, Ser: 2.04 mg/dL — ABNORMAL HIGH (ref 0.44–1.00)
GFR, Estimated: 24 mL/min — ABNORMAL LOW (ref >60)
Glucose, Bld: 140 mg/dL — ABNORMAL HIGH (ref 70–99)
Potassium: 4.1 mmol/L (ref 3.5–5.1)
Sodium: 139 mmol/L (ref 135–145)
Total Bilirubin: 0.9 mg/dL (ref 0.3–1.2)
Total Protein: 6.8 g/dL (ref 6.5–8.1)

## 2022-06-16 LAB — RESP PANEL BY RT-PCR (FLU A&B, COVID) ARPGX2
Influenza A by PCR: NEGATIVE
Influenza B by PCR: NEGATIVE
SARS Coronavirus 2 by RT PCR: NEGATIVE

## 2022-06-16 LAB — BRAIN NATRIURETIC PEPTIDE: B Natriuretic Peptide: 524.6 pg/mL — ABNORMAL HIGH (ref 0.0–100.0)

## 2022-06-16 LAB — TROPONIN I (HIGH SENSITIVITY)
Troponin I (High Sensitivity): 27 ng/L — ABNORMAL HIGH (ref <18)
Troponin I (High Sensitivity): 37 ng/L — ABNORMAL HIGH (ref <18)
Troponin I (High Sensitivity): 34 ng/L — ABNORMAL HIGH (ref <18)

## 2022-06-16 MED ORDER — SODIUM CHLORIDE 0.9 % IV SOLN
2.0000 g | INTRAVENOUS | Status: DC
  Administered 2022-06-17: 2 g via INTRAVENOUS
  Filled 2022-06-16: qty 12.5

## 2022-06-16 MED ORDER — ENOXAPARIN SODIUM 40 MG/0.4ML IJ SOSY
40.0000 mg | PREFILLED_SYRINGE | INTRAMUSCULAR | Status: DC
  Administered 2022-06-16 – 2022-06-18 (×3): 40 mg via SUBCUTANEOUS
  Filled 2022-06-16 (×3): qty 0.4

## 2022-06-16 MED ORDER — SODIUM CHLORIDE 0.9 % IV SOLN: INTRAVENOUS | Status: DC

## 2022-06-16 MED ORDER — ATORVASTATIN CALCIUM 10 MG PO TABS
20.0000 mg | ORAL_TABLET | Freq: Every day | ORAL | Status: DC
  Administered 2022-06-16 – 2022-06-19 (×4): 20 mg via ORAL
  Filled 2022-06-16 (×4): qty 2

## 2022-06-16 MED ORDER — IMATINIB MESYLATE 400 MG PO TABS: 400.0000 mg | ORAL_TABLET | Freq: Every day | ORAL | Status: DC

## 2022-06-16 MED ORDER — ONDANSETRON HCL 4 MG/2ML IJ SOLN: 4.0000 mg | Freq: Once | INTRAMUSCULAR | Status: DC

## 2022-06-16 MED ORDER — ONDANSETRON HCL 4 MG/2ML IJ SOLN
4.0000 mg | Freq: Once | INTRAMUSCULAR | Status: AC
  Administered 2022-06-16: 4 mg via INTRAVENOUS
  Filled 2022-06-16: qty 2

## 2022-06-16 MED ORDER — CALCIUM CARBONATE ANTACID 500 MG PO CHEW
1.0000 | CHEWABLE_TABLET | Freq: Three times a day (TID) | ORAL | Status: DC | PRN
  Administered 2022-06-16: 200 mg via ORAL
  Filled 2022-06-16: qty 1

## 2022-06-16 MED ORDER — VANCOMYCIN HCL IN DEXTROSE 1-5 GM/200ML-% IV SOLN: 1000.0000 mg | INTRAVENOUS | Status: DC

## 2022-06-16 MED ORDER — ASPIRIN 81 MG PO TBEC
81.0000 mg | DELAYED_RELEASE_TABLET | Freq: Every day | ORAL | Status: DC
  Administered 2022-06-16 – 2022-06-18 (×3): 81 mg via ORAL
  Filled 2022-06-16 (×3): qty 1

## 2022-06-16 MED ORDER — SODIUM CHLORIDE 0.9 % IV SOLN
2.0000 g | Freq: Once | INTRAVENOUS | Status: AC
  Administered 2022-06-16: 2 g via INTRAVENOUS
  Filled 2022-06-16: qty 12.5

## 2022-06-16 MED ORDER — VANCOMYCIN HCL 2000 MG/400ML IV SOLN
2000.0000 mg | Freq: Once | INTRAVENOUS | Status: AC
  Administered 2022-06-16: 2000 mg via INTRAVENOUS
  Filled 2022-06-16: qty 400

## 2022-06-16 MED ORDER — ONDANSETRON 4 MG PO TBDP
4.0000 mg | ORAL_TABLET | Freq: Once | ORAL | Status: AC
  Administered 2022-06-16: 4 mg via ORAL

## 2022-06-16 MED ORDER — AMIODARONE HCL 200 MG PO TABS
200.0000 mg | ORAL_TABLET | Freq: Every day | ORAL | Status: DC
  Administered 2022-06-16 – 2022-06-19 (×4): 200 mg via ORAL
  Filled 2022-06-16 (×4): qty 1

## 2022-06-16 MED ORDER — LACTATED RINGERS IV BOLUS
1000.0000 mL | Freq: Once | INTRAVENOUS | Status: AC
  Administered 2022-06-16: 1000 mL via INTRAVENOUS

## 2022-06-16 MED ORDER — ACETAMINOPHEN 325 MG PO TABS: 650.0000 mg | ORAL_TABLET | Freq: Once | ORAL | Status: DC

## 2022-06-16 MED ORDER — THYROID 30 MG PO TABS
15.0000 mg | ORAL_TABLET | Freq: Every day | ORAL | Status: DC
  Administered 2022-06-17 – 2022-06-19 (×3): 15 mg via ORAL
  Filled 2022-06-16 (×3): qty 1

## 2022-06-16 NOTE — H&P (Signed)
Hospital Admission History and Physical Service Pager: [phone]  Patient name: Lisa [name] Medical record number: [id number] Date of Birth: 08-Jul-1941 Age: 81 y.o. Gender: female  Primary Care Provider: Patient, No Pcp Per Consultants: None  Code Status: FULL  Preferred Emergency Contact:  Contact Information     Name Relation Home Work Mobile   Kemp,Shannon Daughter   [phone]   Burgin,Scott Son   [phone]        Chief Complaint: Nausea/Vomiting/Diarrhea  Assessment and Plan: Lisa [name] is a 81 y.o. female presenting with nausea, vomiting, and diarrhea onset 06/15/2022. 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 Differential for this patient's presentation of this includes viral illness causing gastroenteritis. Unlikely cardiac etiology given presentation, no appearance of peritoneal abdomen on physical examination, and patient is without neurological deficits.   * Nausea & vomiting Likely related to meeting SIRs criteria, reassuringly with unremarkable abdominal exam and normal CT.  Continue with conservative treatment including antiemetics and analgesics as indicated.  SIRS (systemic inflammatory response syndrome) (HCC) Patient meets SIRS criteria with lactic acidosis, slight leukocytosis, slight tachypnea with febrile episode to 100.9 @ 0200 on 11/7.  It is possible that the patient has a viral illness leading to gastroenteritis, unlikely pulmonary in origin with lack of symptoms and chest x-ray with possible progressing pneumonia vs atelectasis but does not appear significant on my visualization. CTA/P negative. No prior sense of ulcerations on the skin without urinary symptoms. - Admit to med telemetry FMTS service, Dr. Lennie Odor attending - AM CBC and CMP - Lactic acid trend -Symptomatic treatment with antiemetics as needed and Tylenol  for pain relief -Continue with carb modified diet as tolerated - Blood culture x2 - UA and urine culture - Broad-spectrum antibiotics with cefepime and vancomycin - MRSA swab pending >discontinue vancomycin with negative - PT OT evaluate and treat - RPP pending - Consider GI pathogen panel if indicated - S/p LR 1L  Ventricular fibrillation ([place]) Previously had been on amiodarone 400 daily in addition to torsemide 20 mg as directed. - Will restart medications as indicated   Lymphedema Presented with compression stockings, per patient edema was significantly decreased from previous.  Essential hypertension Normotensive today, hold home antihypertensives in the setting of SIRs  AKI (acute kidney injury) ([place]) Creatinine today 2.04 >baseline appears to be 1.2-1.4 meeting criteria for acute kidney infection on CKD.  Tenuous volume status with history of heart failure, will continue to monitor. - AM CMP - Avoid nephrotoxic agents - Strict ins and outs  Ventricular tachycardia (HCC) Continuous cardiac monitoring   Chronic systolic heart failure ([place]) Last TTE 06/2021 consistent with HFmrEF with EF 45 to 85%, grade 2 diastolic dysfunction. Would be a proponent of updated echo but will defer to team discussion. Tenuous volume status. Continuing to monitor, may need cardiology involvement for history of VFIB. Although BNP elevated, does not appear in acute exacerbation.   Other specified hypothyroidism TSH documented 3 weeks ago was normal.  Continue home Armour 15 mg  Elevated LFTs: AST 60, ALT 55 with normal Tbili. May be slightly elevated from shock liver, no evidence of cholestatic pattern, CT A/P unremarkable. Recheck in AM.   Macrocytic anemia with CML, continue to monitor.  CML: follows with [facility] for oral chemo; imatinib to be continued   FEN/GI: Carb modified VTE Prophylaxis: Lovenox   Disposition: Med tele  History of Present Illness:  Lisa [name] is a 81 y.o.  female presenting with onset of nausea, vomiting, and diarrhea approximately 12 hours ago when she was in a hotel room.  She reports that she ate some soup with chicken and it and that at approximately 11:30 PM, began to vomit.  She reports that she vomited at least 10 times over the course of last night into today.  She has also had 2 episodes of diarrhea.  Denies hematochezia or hematemesis.  No presence of confusion, chest pain.  She has recurrent dyspnea that is chronic and has been unchanged since the onset of the symptoms.  The patient reports that she just left the SNF yesterday and if she thinks about it, was experiencing some nausea for the last 3 days or so.  Was previously admitted for community-acquired pneumonia and completed antibiotics with discharged to SNF.  Admission date then was 05/21/22.   In the ED, they ordered Zofran for nausea, started broad-spectrum antibiotics with vancomycin and cefepime and gave the patient 1 L of LR.  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    Pertinent Past Surgical History: Appendectomy Hysterectomy ICD insertion 2017 Remainder reviewed in history tab.   Pertinent Social History: Tobacco use: No Alcohol use: rare Other Substance use: denies Lives with self in [place]. Daughter lives in [place]   Pertinent Family History: Mother - atheroscloerosis Father - multiple myeloma Aunt - breast cancer No FMHx CVA  Remainder reviewed in history tab.   Important Outpatient Medications: Tylenol every 6 as needed Allopurinol 100 Mg daily Aluminum hydroxide/mag carb 3 times daily Amiodarone 400 daily Sorbic acid  500 daily Aspirin daily Atorvastatin 20 daily Calcium carbonate Cholecalciferol Cyanocobalamin Ferrous sulfate Imatinib 400 daily Inulin Loperamide as needed Mag oxide 400 Mg twice daily Potassium chloride as needed Thyroid hormone 15 Mg daily before breakfast   Remainder reviewed in medication history.   Objective: BP (!) 103/52   Pulse 79   Temp 98.1 F (36.7 C)   Resp (!) 23   LMP 02/14/1995   SpO2 90%  Exam: General: Nontoxic and well-appearing, obese Caucasian female Eyes: EOMI ENTM: No obvious abnormalities, nares normal, external ears normal Neck: No LAD Cardiovascular: Regular rate and rhythm Respiratory: Clear to auscultation bilaterally Gastrointestinal: Soft, mildly TTP, nondistended, normoactive bowel sounds, no peritoneal findings  MSK: Able to move all extremities, significant lymphedema in the bilateral lower extremities covered by compression stockings Neuro: No abnormalities in neurologic status Psych: Appropriate mood and affect  Labs:  CBC BMET  Recent Labs  Lab 06/16/22 0232  WBC 11.5*  HGB 9.7*  HCT 29.6*  PLT 248   Recent Labs  Lab 06/16/22 0232  NA 139  K 4.1  CL 104  CO2 25  BUN 25*  CREATININE 2.04*  GLUCOSE 140*  CALCIUM 8.9     EKG:  EKG: unchanged from previous tracings, vent paced rhythm.    Imaging Studies Performed:  Imaging Study (ie. Chest x-ray) Impression from Radiologist: CT CHEST ABDOMEN PELVIS WO CONTRAST  Result Date: 06/16/2022 CLINICAL DATA:  Nausea and vomiting. EXAM: CT CHEST, ABDOMEN AND PELVIS WITHOUT CONTRAST TECHNIQUE: Multidetector CT imaging of the chest, abdomen and  pelvis was performed following the standard protocol without IV contrast. RADIATION DOSE REDUCTION: This exam was performed according to the departmental dose-optimization program which includes automated exposure control, adjustment of the mA and/or kV according to patient size and/or use of iterative reconstruction technique.  COMPARISON:  CT scan 11/16/2020 FINDINGS: CT CHEST FINDINGS Cardiovascular: Stable mild cardiac enlargement, most notably left atrial and left ventricular enlargement. Pacer wires/AICD are stable. No periportal or effusion. Stable aortic and coronary artery calcifications. Mediastinum/Nodes: No mediastinal or hilar mass or lymphadenopathy. The esophagus is grossly normal. Small hiatal hernia. Lungs/Pleura: Small left pleural effusion with overlying atelectasis. No pulmonary edema or pulmonary infiltrates. No worrisome pulmonary lesions or nodules. Musculoskeletal: Rim calcified bilateral breast prostheses. No supraclavicular or axillary adenopathy. The thyroid gland is unremarkable. The bony thorax is intact. Exaggerated thoracic kyphosis and degenerative changes involving the thoracic spine. CT ABDOMEN PELVIS FINDINGS Hepatobiliary: No hepatic lesions or intrahepatic biliary dilatation. Small hepatic cyst noted in segment 6. Small calcified gallstone noted in the gallbladder. No CT findings to suggest acute cholecystitis. Pancreas: No mass, inflammation or ductal dilatation. Spleen: Normal size.  No focal lesions. Adrenals/Urinary Tract: 16 mm left adrenal gland nodule measures 13 Hounsfield units and is consistent with a benign adenoma. The right adrenal gland is normal. No renal lesions are identified without contrast. No renal or obstructing ureteral calculi. Bladder is grossly normal. Stomach/Bowel: The stomach, duodenum, small bowel and colon are grossly normal. No inflammatory changes or obstructive findings. Scattered high attenuation material in the bowel likely due to ingested material such as Tums or Pepto-Bismol. Vascular/Lymphatic: Advanced atherosclerotic calcification involving the aorta and iliac arteries but no aneurysm. Small scattered mesenteric and retroperitoneal lymph nodes but no mass or adenopathy. Reproductive: Surgically absent. Other: No pelvic mass or adenopathy. No free pelvic fluid  collections. No inguinal mass or adenopathy. No abdominal wall hernia or subcutaneous lesions. Musculoskeletal: No significant bony findings. Degenerative facet disease noted in the lower lumbar spine. IMPRESSION: 1. Small left pleural effusion with overlying atelectasis. 2. Stable mild cardiac enlargement, most notably left atrial and left ventricular enlargement. 3. No acute abdominal/pelvic findings, mass lesions or adenopathy. 4. Cholelithiasis. 5. 16 mm left adrenal gland nodule consistent with a benign adenoma. 6. Advanced atherosclerotic calcification involving the aorta and iliac arteries. 7. Aortic atherosclerosis. Aortic Atherosclerosis (ICD10-I70.0). Electronically Signed   By: Marijo Sanes M.D.   On: 06/16/2022 08:27   DG Chest 2 View  Result Date: 06/16/2022 CLINICAL DATA:  Nausea, vomiting, diarrhea, chest tightness EXAM: CHEST - 2 VIEW COMPARISON:  Radiograph 05/21/2022 FINDINGS: Retrocardiac airspace opacity. Probable small left pleural effusion. The right lung is clear. No pneumothorax. Stable enlargement of the cardiomediastinal silhouette. Chronic vascular congestion. Left chest wall CRT-D. No acute osseous abnormality. Aortic atherosclerotic calcification. IMPRESSION: Retrocardiac airspace opacity may reflect atelectasis or pneumonia. Cardiomegaly and pulmonary vascular congestion. Electronically Signed   By: Placido Sou M.D.   On: 06/16/2022 03:15       Erskine Emery, MD 06/16/2022, 11:20 AM PGY-2, [place] Intern pager: [phone], text pages welcome Secure chat group [place]

## 2022-06-16 NOTE — Progress Notes (Signed)
Orthopedic Tech Progress Note Patient Details:  Lisa [name] [birth year] [id number]  ED RN called requesting to have [employer]OTS applied, per WOC they had been changed yesterday at the facility where patient is living at, [employer] will be change per order Q THURS and MON    Patient ID: Lisa [name], female   DOB: 1940-11-01, 81 y.o.   MRN: [id number]  [name] 06/16/2022, 12:49 PM

## 2022-06-16 NOTE — ED Provider Triage Note (Signed)
Emergency Medicine Provider Triage Evaluation Note  Lisa [name] , a 81 y.o. female  was evaluated in triage.  Pt complains of nausea, vomiting, diarrhea. Patient states symptoms began around 11pm tonight. States she woke up very nauseated and had an episode of emesis. She then had an episode of diarrhea and has since persisted. States she is currently nauseated but denies having abdominal pain. Denies dysuria. She is also having mild dull pain to her left back. States she was recently admitted to the hospital and these symptoms feel similar.  Review of Systems  Positive: See above Negative:   Physical Exam  BP (!) 135/53   Pulse (!) 103   Temp (!) 100.9 F (38.3 C) (Oral)   Resp 16   LMP 02/14/1995   SpO2 94%  Gen:   Awake, no distress   Resp:  Normal effort  MSK:   Moves extremities without difficulty  Other:  Dry heaving on exam, abdomen is soft   Medical Decision Making  Medically screening exam initiated at 2:29 AM.  Appropriate orders placed.  Lisa [name] was informed that the remainder of the evaluation will be completed by another provider, this initial triage assessment does not replace that evaluation, and the importance of remaining in the ED until their evaluation is complete.     Lisa Hillier, PA-C 06/16/22 0231

## 2022-06-16 NOTE — Assessment & Plan Note (Signed)
Presented with compression stockings, per patient edema was significantly decreased from previous.

## 2022-06-16 NOTE — Assessment & Plan Note (Signed)
Had episodes in the past though with an ICD; followed closely with cardiology last admission. -Continuous cardiac monitoring

## 2022-06-16 NOTE — Assessment & Plan Note (Addendum)
Creatinine 2.04 on admission and now 1.15. Tenuous volume status with history of heart failure, will continue to monitor. - AM CMP - KVO fluids given she is back to baseline - Avoid nephrotoxic agents - Strict ins and outs

## 2022-06-16 NOTE — Assessment & Plan Note (Signed)
Likely related to rhino/entero+, reassuringly with unremarkable abdominal exam and normal CT.  -Continue with conservative treatment including antiemetics and analgesics as indicated, likely IV compazine in the future if refractory symptoms given prolonged QTc -Continue tums for indigestion that leads to nausea and vomiting

## 2022-06-16 NOTE — Progress Notes (Signed)
Pharmacy Antibiotic Note  Lisa [name] is a 81 y.o. female admitted on 06/16/2022 with Sepsis.  Pharmacy has been consulted for Vancomycin and Cefepime dosing.  CrCl 25-35 mL/min - appears patient is in AKI with Scr 2.04 (last documented in October as 1.22)  Plan: Vancomycin '2000mg'$  IV once then '1000mg'$  Q48H (eAUC 462, Scr 2.04, Vd 0.5) Goal AUC 400-550 Cefepime 2g Q24H  Monitor renal function for dose adjustments - anticipate resolution of AKI F/u cultures for de-escalation - MRSA nares ordered but not collected     Temp (24hrs), Avg:99.9 F (37.7 C), Min:98.9 F (37.2 C), Max:100.9 F (38.3 C)  Recent Labs  Lab 06/16/22 0232  WBC 11.5*  CREATININE 2.04*    CrCl cannot be calculated (Unknown ideal weight.).    Allergies  Allergen Reactions   Flagyl [Metronidazole] Itching, Rash and Other (See Comments)    Welts, also   Penicillins Hives, Itching and Rash    Has patient had a PCN reaction causing immediate rash, facial/tongue/throat swelling, SOB or lightheadedness with hypotension: Yes  Has patient had a PCN reaction causing severe rash involving mucus membranes or skin necrosis: Yes Has patient had a PCN reaction that required hospitalization No Has patient had a PCN reaction occurring within the last 10 years: NO If all of the above answers are "NO", then may proceed with Cephalosporin use.    Sulfamethoxazole Rash   Meperidine Nausea And Vomiting   Morphine And Related Nausea And Vomiting   Propoxyphene     Other reaction(s): Unknown   Tape Other (See Comments)    "THE PLASTIC, CLEAR TAPE CAN/DOES PULL OFF MY SKIN."   Doxycycline Nausea Only, Rash and Other (See Comments)    Made her "feel terrible"   Lanolin Itching and Rash   Sulfa Antibiotics Rash    Thank you for allowing pharmacy to be a part of this patient's care.  Merrilee Jansky, PharmD Clinical Pharmacist 06/16/2022 7:34 AM

## 2022-06-16 NOTE — ED Notes (Signed)
Patient cc of indigestion, MD notified. Pt also reports not having any appetite to eat at this time. Fluids offered. NAD

## 2022-06-16 NOTE — Progress Notes (Signed)
FMTS Brief Progress Note  S:Patient reports that she is feeling a little bit better than she was on admission but is having indigestion. She feels like when she has a lot of phlegm and mucous that it starts to upset her stomach and makes her nauseous and need to spit up/vomit (at home she will take Gaviscon). Otherwise she overall feels unwell but no new concerns.    O: BP (!) 137/52   Pulse 94   Temp 98.1 F (36.7 C)   Resp 18   Ht '5\' 2"'$  (1.575 m)   Wt 96.6 kg   LMP 02/14/1995   SpO2 93%   BMI 38.96 kg/m   General: elderly female, NAD and non-toxic appearing  CV: RRR Respiratory: CTAB, breathing comfortably on room air GI: soft, mild TTP in the RUQ, non-distended, no peritoneal signs  A/P: Nausea and vomiting Stable but likely some component of indigestion contributing to stomach being unsettled. QTc is prolonged, so would prefer to minimize prolonging medication - Tums PRN overnight - Consider IV compazine if necessary   SIRS criteria, possible viral gastroenteritis - Monitor vitals and fever curve - Following cultures - Continue cefepime and vanc, de-escalate as able   Rise Patience, DO 06/16/2022, 9:25 PM PGY-3, [facility] Night Resident  Please page [phone] with questions.

## 2022-06-16 NOTE — Assessment & Plan Note (Addendum)
TSH documented 3 weeks ago was normal. -Continue home Armour 15 mg

## 2022-06-16 NOTE — ED Triage Notes (Signed)
Patient from hotel room ([place]) with 3 hours of nausea/vomiting/diarrhea since eating chick fila.  BP 144/P, pulse of 105, temp of 100.  SpO2 96% CBG 186.

## 2022-06-16 NOTE — ED Notes (Signed)
Admitting provider at bedside.

## 2022-06-16 NOTE — Assessment & Plan Note (Addendum)
Stable. On IV cefepime and vanc. Suspect immunosuppression from imatinib for CML and recent hospital stay contributing to infection. ID obtained TTE which was overall negative; suspect total 7 day course of abx. -Continue IV cefepime for now; once sensitivities result, can switch to cipro until EOT 11/13, appreciate ID recs -PT OT evaluate and treat -Monitor clinical status and VS

## 2022-06-16 NOTE — ED Notes (Signed)
Lactic 3.5. MD aware. LR ordered.

## 2022-06-16 NOTE — ED Provider Notes (Signed)
Patient signed out to me at 7 AM.  Patient here with fever, nausea, cough, diarrhea.  Recent admission for pneumonia and cardiac issues.  She has had some emesis, diarrhea.  She has had some tightness in her upper back.  She has had some cough.  Broad work-up initiated.  She is got history of heart failure and ICD.  Looks grossly volume overloaded over loaded on exam but hard to tell if it is lymphedema.  Cardiac work-up and sepsis work-up have been initiated.  Patient was febrile and tachycardic upon arrival.  Broad-spectrum IV antibiotics have been started.  Lactic acid is 3.5 therefore fluid bolus has been initiated.  Troponins have been completed and are stable at 27 and 37.  Creatinine is up slightly from baseline with a creatinine of 2.  Mild leukocytosis of 11.5.  Chest x-ray performed per radiology report shows possible pneumonia.  Patient awaiting CT scan of chest abdomen and pelvis to further evaluate for infectious process.  Anticipate admission.  Lactic acid continues to be elevated at 3.1.  COVID and flu test negative.  CT scan of chest abdomen and pelvis overall unremarkable.  Overall I suspect that this is a viral process.  UA still pending.  She still not able to tolerate p.o.  We will admit her for further symptomatic care.  This chart was dictated using voice recognition software.  Despite best efforts to proofread,  errors can occur which can change the documentation meaning.    Lennice Sites, DO 06/16/22 [phone]

## 2022-06-16 NOTE — ED Notes (Signed)
Admitting provider notified of patient blood pressure running low. Patient denies any pain, discomfort, or needs just reports "I'm just sleepy."   Per MD Markus Jarvis , "Thanks for updating [place]. Let's continue to monitor for now."

## 2022-06-16 NOTE — ED Notes (Signed)
This RN, Claiborne Billings, RN and Sharol Given, RN all attempted to start IV on pt unsuccessfully. MD notified.

## 2022-06-16 NOTE — Assessment & Plan Note (Addendum)
Previously had been on amiodarone 400 daily in addition to torsemide 20 mg as directed. Patient would like her cardiologist to be notified about her presence here. -Continue amiodarone 200 mg daily -Continue to hold torsemide for now given softer BP -FYI'd cards about patient

## 2022-06-16 NOTE — ED Provider Notes (Addendum)
[facility] EMERGENCY DEPARTMENT Provider Note   CSN: [id number] Arrival date & time: 06/16/22  0205     History  Chief Complaint  Patient presents with   Emesis   Nausea    Lisa [name] is a 81 y.o. female.  Patient presented to the ED tonight after she developed onset of nausea and vomiting followed by diarrhea.  Patient reports multiple episodes of emesis, cannot hold anything down.  Patient has not had any associated abdominal pain.  She does report dull pain and "tightness" in her upper back.       Home Medications Prior to Admission medications   Medication Sig Start Date End Date Taking? Authorizing Provider  acetaminophen (TYLENOL) 500 MG tablet Take 500-1,000 mg by mouth every 6 (six) hours as needed for moderate pain.    [provider]  allopurinol (ZYLOPRIM) 100 MG tablet Take 100 mg by mouth daily.  06/15/17   [provider]  aluminum hydroxide-magnesium carbonate (GAVISCON) 95-358 MG/15ML SUSP Take 15 mLs by mouth 3 (three) times daily after meals.    [provider]  amiodarone (PACERONE) 400 MG tablet Take 1 tablet (400 mg total) by mouth daily. Take daily until cardiology follow up appointment. 05/28/22   Gerrit Heck, MD  aspirin EC 81 MG tablet Take 81 mg by mouth at bedtime.     [provider]  atorvastatin (LIPITOR) 20 MG tablet Take 1 tablet (20 mg total) by mouth daily. 05/07/16   Bensimhon, Shaune Pascal, MD  b complex vitamins capsule Take 1 capsule by mouth daily.    [provider]  calcium carbonate (OS-CAL - DOSED IN MG OF ELEMENTAL CALCIUM) 1250 (500 Ca) MG tablet Take 1 tablet by mouth.    [provider]  Cholecalciferol (VITAMIN D-3 PO) Take 5,000 Units by mouth daily.    [provider]  ferrous sulfate 325 (65 FE) MG EC tablet Take 1 tablet by mouth 3 (three) times a week. 06/27/21   [provider]  imatinib (GLEEVEC) 400 MG tablet Take 400 mg by  mouth at bedtime. 02/16/17   [provider]  Inulin (METAMUCIL CLEAR & NATURAL PO) Take by mouth See admin instructions. Mix 1 teaspoonful into 8 ounces of water and drink as needed    [provider]  Loperamide HCl 1 MG/7.5ML LIQD Take by mouth as needed.    [provider]  Magnesium Oxide 400 MG CAPS Take 1 capsule (400 mg total) by mouth 2 (two) times daily. 10/18/19   Baldwin Jamaica, PA-C  mexiletine (MEXITIL) 250 MG capsule Take 1 capsule (250 mg total) by mouth every 12 (twelve) hours for 1 day. 05/28/22 05/29/22  Gerrit Heck, MD  Multiple Vitamin (MULTIVITAMIN WITH MINERALS) TABS tablet Take 1 tablet by mouth daily.    [provider]  nitroGLYCERIN (NITROSTAT) 0.4 MG SL tablet Place 1 tablet (0.4 mg total) under the tongue every 5 (five) minutes x 3 doses as needed for chest pain. 12/24/20   Fay Records, MD  potassium chloride (KLOR-CON) 10 MEQ tablet Take 10 mEq by mouth as directed. Take with Torsemide    [provider]  thyroid (ARMOUR) 15 MG tablet Take 15 mg by mouth daily before breakfast.     [provider]  torsemide (DEMADEX) 20 MG tablet Take 1 tablet (20 mg total) by mouth as directed. Patient not taking: Reported on 05/21/2022 08/21/20   Fay Records, MD  vitamin B-12 (CYANOCOBALAMIN) 500  MCG tablet Take 500 mcg by mouth daily.    [provider]  vitamin C (ASCORBIC ACID) 500 MG tablet Take 500 mg by mouth daily.    [provider]      Allergies    Flagyl [metronidazole], Penicillins, Sulfamethoxazole, Meperidine, Morphine and related, Propoxyphene, Tape, Doxycycline, Lanolin, and Sulfa antibiotics    Review of Systems   Review of Systems  Physical Exam Updated Vital Signs BP (!) 138/51   Pulse 93   Temp 98.9 F (37.2 C) (Oral)   Resp 20   LMP 02/14/1995   SpO2 93%  Physical Exam Vitals and nursing note reviewed.  Constitutional:      General: She is not in acute distress.     Appearance: She is well-developed.  HENT:     Head: Normocephalic and atraumatic.     Mouth/Throat:     Mouth: Mucous membranes are dry.  Eyes:     General: Vision grossly intact. Gaze aligned appropriately.     Extraocular Movements: Extraocular movements intact.     Conjunctiva/sclera: Conjunctivae normal.  Cardiovascular:     Rate and Rhythm: Normal rate and regular rhythm.     Pulses: Normal pulses.     Heart sounds: Normal heart sounds, S1 normal and S2 normal. No murmur heard.    No friction rub. No gallop.  Pulmonary:     Effort: Pulmonary effort is normal. No respiratory distress.     Breath sounds: Normal breath sounds.  Abdominal:     General: Bowel sounds are normal.     Palpations: Abdomen is soft.     Tenderness: There is generalized abdominal tenderness. There is no guarding or rebound.     Hernia: No hernia is present.  Musculoskeletal:        General: No swelling.     Cervical back: Full passive range of motion without pain, normal range of motion and neck supple. No spinous process tenderness or muscular tenderness. Normal range of motion.     Right lower leg: No edema.     Left lower leg: No edema.  Skin:    General: Skin is warm and dry.     Capillary Refill: Capillary refill takes less than 2 seconds.     Findings: No ecchymosis, erythema, rash or wound.  Neurological:     General: No focal deficit present.     Mental Status: She is alert and oriented to person, place, and time.     GCS: GCS eye subscore is 4. GCS verbal subscore is 5. GCS motor subscore is 6.     Cranial Nerves: Cranial nerves 2-12 are intact.     Sensory: Sensation is intact.     Motor: Motor function is intact.     Coordination: Coordination is intact.  Psychiatric:        Attention and Perception: Attention normal.        Mood and Affect: Mood normal.        Speech: Speech normal.        Behavior: Behavior normal.     ED Results / Procedures / Treatments   Labs (all labs  ordered are listed, but only abnormal results are displayed) Labs Reviewed  COMPREHENSIVE METABOLIC PANEL - Abnormal; Notable for the following components:      Result Value   Glucose, Bld 140 (*)    BUN 25 (*)    Creatinine, Ser 2.04 (*)    Albumin 3.2 (*)    AST 60 (*)  ALT 55 (*)    GFR, Estimated 24 (*)    All other components within normal limits  CBC WITH DIFFERENTIAL/PLATELET - Abnormal; Notable for the following components:   WBC 11.5 (*)    RBC 3.07 (*)    Hemoglobin 9.7 (*)    HCT 29.6 (*)    RDW 16.1 (*)    Neutro Abs 10.6 (*)    Lymphs Abs 0.3 (*)    All other components within normal limits  CBG MONITORING, ED - Abnormal; Notable for the following components:   Glucose-Capillary 134 (*)    All other components within normal limits  TROPONIN I (HIGH SENSITIVITY) - Abnormal; Notable for the following components:   Troponin I (High Sensitivity) 27 (*)    All other components within normal limits  CULTURE, BLOOD (ROUTINE X 2)  CULTURE, BLOOD (ROUTINE X 2)  URINE CULTURE  LIPASE, BLOOD  URINALYSIS, ROUTINE W REFLEX MICROSCOPIC  LACTIC ACID, PLASMA  LACTIC ACID, PLASMA  PROTIME-INR  APTT  TROPONIN I (HIGH SENSITIVITY)    EKG EKG Interpretation  Date/Time:  Tuesday June 16 2022 02:40:39 EST Ventricular Rate:  100 PR Interval:  138 QRS Duration: 132 QT Interval:  444 QTC Calculation: 572 R Axis:   252 Text Interpretation: Atrial-sensed ventricular-paced rhythm Abnormal ECG When compared with ECG of 21-May-2022 13:15, No significant change was found Confirmed by Delora Fuel ([id number]) on 06/16/2022 3:26:10 AM  Radiology DG Chest 2 View  Result Date: 06/16/2022 CLINICAL DATA:  Nausea, vomiting, diarrhea, chest tightness EXAM: CHEST - 2 VIEW COMPARISON:  Radiograph 05/21/2022 FINDINGS: Retrocardiac airspace opacity. Probable small left pleural effusion. The right lung is clear. No pneumothorax. Stable enlargement of the cardiomediastinal silhouette. Chronic  vascular congestion. Left chest wall CRT-D. No acute osseous abnormality. Aortic atherosclerotic calcification. IMPRESSION: Retrocardiac airspace opacity may reflect atelectasis or pneumonia. Cardiomegaly and pulmonary vascular congestion. Electronically Signed   By: Placido Sou M.D.   On: 06/16/2022 03:15    Procedures Procedures  Angiocath insertion Performed by: Orpah Greek  Consent: Verbal consent obtained. Risks and benefits: risks, benefits and alternatives were discussed Time out: Immediately prior to procedure a "time out" was called to verify the correct patient, procedure, equipment, support staff and site/side marked as required.  Preparation: Patient was prepped and draped in the usual sterile fashion.  Vein Location: R antecub  Ultrasound Guided  Gauge: 20G  Normal blood return and flush without difficulty Patient tolerance: Patient tolerated the procedure well with no immediate complications.    Medications Ordered in ED Medications  acetaminophen (TYLENOL) tablet 650 mg (has no administration in time range)  ondansetron (ZOFRAN-ODT) disintegrating tablet 4 mg (4 mg Oral Given 06/16/22 0514)    ED Course/ Medical Decision Making/ A&P                           Medical Decision Making Amount and/or Complexity of Data Reviewed External Data Reviewed: labs, radiology, ECG and notes. Labs: ordered. Decision-making details documented in ED Course. Radiology: ordered and independent interpretation performed. Decision-making details documented in ED Course. ECG/medicine tests: ordered and independent interpretation performed. Decision-making details documented in ED Course.   Patient with history of systolic heart failure, status post ICD, presents to the emergency department for evaluation of nausea, vomiting, diarrhea.  She also was complaining of some discomfort in her upper back that she describes as a "tightness".  Patient reports that she was recently  hospitalized with similar symptoms  and treated for significant volume overload.  Differential diagnosis considered includes ACS/NSTEMI, congestive heart failure exacerbation, small bowel obstruction, gastritis/gastroenteritis, colitis/diverticulitis.  Patient noted to have acute kidney injury.  She was recently hospitalized with aggressive diuresis, unclear if her kidney function is increased because of recent diuresis or from her nausea and vomiting and poor oral intake.  Difficult to assess if she is euvolemic as she has chronic lymphedema and significant swelling of her legs.   Patient does complain of upper back discomfort.  Troponin is mildly elevated.  This could be leak secondary to congestive heart failure and sepsis, will need to cycle enzymes and further evaluate for NSTEMI.  Patient noted to be febrile at arrival.  Will obtain cultures including blood and urine.  Will add on lactic acid. Patient does have some diffuse abdominal tenderness but no signs of peritonitis.  We will perform CT scan to further evaluate.  CT chest as well to evaluate possible retrocardiac opacity on chest x-ray.  Will sign out to oncoming ER physician to follow-up on results.  CRITICAL CARE Performed by: Orpah Greek   Total critical care time: 31 minutes  Critical care time was exclusive of separately billable procedures and treating other patients.  Critical care was necessary to treat or prevent imminent or life-threatening deterioration.  Critical care was time spent personally by me on the following activities: development of treatment plan with patient and/or surrogate as well as nursing, discussions with consultants, evaluation of patient's response to treatment, examination of patient, obtaining history from patient or surrogate, ordering and performing treatments and interventions, ordering and review of laboratory studies, ordering and review of radiographic studies, pulse oximetry and  re-evaluation of patient's condition.         Final Clinical Impression(s) / ED Diagnoses Final diagnoses:  Nausea vomiting and diarrhea    Rx / DC Orders ED Discharge Orders     None         Halvor Behrend, Gwenyth Allegra, MD 06/16/22 [id number]    Orpah Greek, MD 06/16/22 [id number]    Orpah Greek, MD 06/16/22 [phone]

## 2022-06-16 NOTE — Sepsis Progress Note (Signed)
eLink monitoring sepsis protocol °

## 2022-06-16 NOTE — Consult Note (Addendum)
[facility] Nurse Consult Note: Reason for Consult: Consult requested for bilat legs.  Pt is familiar to [facility] team from previous admissions.  She has chronic lymphedema and wears bilat Una boots to reduce the swelling.  She states there are no open wounds or drainage and the bilat Una boots were applied yesterday (Mon).  They are due to be changed twice a week.  Order placed for ortho tech to change bilat Una boots again on Thursday. Please re-consult if further assistance is needed.  Thank-you,  Julien Girt MSN, [place], [place], [place], [place]

## 2022-06-16 NOTE — Assessment & Plan Note (Addendum)
Last TTE 05/2022 with LVEF 45-50% and G2DD. BNP was elevated to 500s on admission, though she does not appear in volume overload. -Continue to monitor fluid status -Will restart diuretics given good BP and Cr

## 2022-06-16 NOTE — Assessment & Plan Note (Addendum)
BP improving, now slightly hypertensive. -Continue metoprolol, consider adding additional BP med given rise in BPs

## 2022-06-16 NOTE — ED Notes (Signed)
Patient denies pain and is resting comfortably.  

## 2022-06-17 ENCOUNTER — Encounter: Payer: Self-pay | Admitting: Internal Medicine

## 2022-06-17 DIAGNOSIS — I10 Essential (primary) hypertension: Secondary | ICD-10-CM

## 2022-06-17 DIAGNOSIS — Z6841 Body Mass Index (BMI) 40.0 and over, adult: Secondary | ICD-10-CM | POA: Diagnosis not present

## 2022-06-17 DIAGNOSIS — R7881 Bacteremia: Secondary | ICD-10-CM

## 2022-06-17 DIAGNOSIS — E038 Other specified hypothyroidism: Secondary | ICD-10-CM | POA: Diagnosis present

## 2022-06-17 DIAGNOSIS — C9210 Chronic myeloid leukemia, BCR/ABL-positive, not having achieved remission: Secondary | ICD-10-CM | POA: Diagnosis present

## 2022-06-17 DIAGNOSIS — Z95810 Presence of automatic (implantable) cardiac defibrillator: Secondary | ICD-10-CM | POA: Diagnosis not present

## 2022-06-17 DIAGNOSIS — Z79899 Other long term (current) drug therapy: Secondary | ICD-10-CM | POA: Diagnosis not present

## 2022-06-17 DIAGNOSIS — I472 Ventricular tachycardia, unspecified: Secondary | ICD-10-CM | POA: Diagnosis present

## 2022-06-17 DIAGNOSIS — A0839 Other viral enteritis: Secondary | ICD-10-CM | POA: Diagnosis present

## 2022-06-17 DIAGNOSIS — I5022 Chronic systolic (congestive) heart failure: Secondary | ICD-10-CM | POA: Diagnosis present

## 2022-06-17 DIAGNOSIS — J189 Pneumonia, unspecified organism: Secondary | ICD-10-CM | POA: Diagnosis present

## 2022-06-17 DIAGNOSIS — R6520 Severe sepsis without septic shock: Secondary | ICD-10-CM | POA: Diagnosis present

## 2022-06-17 DIAGNOSIS — B965 Pseudomonas (aeruginosa) (mallei) (pseudomallei) as the cause of diseases classified elsewhere: Secondary | ICD-10-CM | POA: Diagnosis present

## 2022-06-17 DIAGNOSIS — N179 Acute kidney failure, unspecified: Secondary | ICD-10-CM | POA: Diagnosis present

## 2022-06-17 DIAGNOSIS — A4152 Sepsis due to Pseudomonas: Secondary | ICD-10-CM | POA: Diagnosis present

## 2022-06-17 DIAGNOSIS — B952 Enterococcus as the cause of diseases classified elsewhere: Secondary | ICD-10-CM | POA: Diagnosis present

## 2022-06-17 DIAGNOSIS — D849 Immunodeficiency, unspecified: Secondary | ICD-10-CM | POA: Diagnosis present

## 2022-06-17 DIAGNOSIS — I4901 Ventricular fibrillation: Secondary | ICD-10-CM | POA: Diagnosis present

## 2022-06-17 DIAGNOSIS — R112 Nausea with vomiting, unspecified: Secondary | ICD-10-CM | POA: Diagnosis present

## 2022-06-17 DIAGNOSIS — N390 Urinary tract infection, site not specified: Secondary | ICD-10-CM | POA: Diagnosis present

## 2022-06-17 DIAGNOSIS — E872 Acidosis, unspecified: Secondary | ICD-10-CM | POA: Diagnosis present

## 2022-06-17 DIAGNOSIS — I700 Atherosclerosis of aorta: Secondary | ICD-10-CM | POA: Diagnosis present

## 2022-06-17 DIAGNOSIS — R7401 Elevation of levels of liver transaminase levels: Secondary | ICD-10-CM

## 2022-06-17 DIAGNOSIS — D539 Nutritional anemia, unspecified: Secondary | ICD-10-CM | POA: Diagnosis present

## 2022-06-17 DIAGNOSIS — Z20822 Contact with and (suspected) exposure to covid-19: Secondary | ICD-10-CM | POA: Diagnosis present

## 2022-06-17 DIAGNOSIS — I110 Hypertensive heart disease with heart failure: Secondary | ICD-10-CM | POA: Diagnosis present

## 2022-06-17 LAB — BLOOD CULTURE ID PANEL (REFLEXED) - BCID2

## 2022-06-17 LAB — CBC
HCT: 22.7 % — ABNORMAL LOW (ref 36.0–46.0)
HCT: 23.9 % — ABNORMAL LOW (ref 36.0–46.0)
Hemoglobin: 7.6 g/dL — ABNORMAL LOW (ref 12.0–15.0)
Hemoglobin: 7.5 g/dL — ABNORMAL LOW (ref 12.0–15.0)
MCH: 32.2 pg (ref 26.0–34.0)
MCH: 31.1 pg (ref 26.0–34.0)
MCHC: 33.5 g/dL (ref 30.0–36.0)
MCHC: 31.4 g/dL (ref 30.0–36.0)
MCV: 96.2 fL (ref 80.0–100.0)
MCV: 99.2 fL (ref 80.0–100.0)
Platelets: 186 10*3/uL (ref 150–400)
Platelets: 196 10*3/uL (ref 150–400)
RBC: 2.36 MIL/uL — ABNORMAL LOW (ref 3.87–5.11)
RBC: 2.41 MIL/uL — ABNORMAL LOW (ref 3.87–5.11)
RDW: 16.3 % — ABNORMAL HIGH (ref 11.5–15.5)
RDW: 16.4 % — ABNORMAL HIGH (ref 11.5–15.5)
WBC: 16.3 10*3/uL — ABNORMAL HIGH (ref 4.0–10.5)
WBC: 15.2 10*3/uL — ABNORMAL HIGH (ref 4.0–10.5)
nRBC: 0 % (ref 0.0–0.2)
nRBC: 0 % (ref 0.0–0.2)

## 2022-06-17 LAB — URINALYSIS, ROUTINE W REFLEX MICROSCOPIC
Bacteria, UA: NONE SEEN
Bilirubin Urine: NEGATIVE
Glucose, UA: NEGATIVE mg/dL
Hgb urine dipstick: NEGATIVE
Ketones, ur: NEGATIVE mg/dL
Leukocytes,Ua: NEGATIVE
Nitrite: NEGATIVE
Protein, ur: NEGATIVE mg/dL
Specific Gravity, Urine: 1.018 (ref 1.005–1.030)
pH: 5 (ref 5.0–8.0)

## 2022-06-17 LAB — COMPREHENSIVE METABOLIC PANEL
ALT: 58 U/L — ABNORMAL HIGH (ref 0–44)
AST: 72 U/L — ABNORMAL HIGH (ref 15–41)
Albumin: 2.3 g/dL — ABNORMAL LOW (ref 3.5–5.0)
Alkaline Phosphatase: 50 U/L (ref 38–126)
Anion gap: 8 (ref 5–15)
BUN: 25 mg/dL — ABNORMAL HIGH (ref 8–23)
CO2: 24 mmol/L (ref 22–32)
Calcium: 7.9 mg/dL — ABNORMAL LOW (ref 8.9–10.3)
Chloride: 104 mmol/L (ref 98–111)
Creatinine, Ser: 1.62 mg/dL — ABNORMAL HIGH (ref 0.44–1.00)
GFR, Estimated: 32 mL/min — ABNORMAL LOW (ref >60)
Glucose, Bld: 106 mg/dL — ABNORMAL HIGH (ref 70–99)
Potassium: 3.5 mmol/L (ref 3.5–5.1)
Sodium: 136 mmol/L (ref 135–145)
Total Bilirubin: 0.7 mg/dL (ref 0.3–1.2)
Total Protein: 5.1 g/dL — ABNORMAL LOW (ref 6.5–8.1)

## 2022-06-17 LAB — TROPONIN I (HIGH SENSITIVITY): Troponin I (High Sensitivity): 23 ng/L — ABNORMAL HIGH (ref <18)

## 2022-06-17 LAB — PREPARE RBC (CROSSMATCH)

## 2022-06-17 MED ORDER — SODIUM CHLORIDE 0.9% IV SOLUTION: Freq: Once | INTRAVENOUS | Status: AC

## 2022-06-17 MED ORDER — GUAIFENESIN ER 600 MG PO TB12
600.0000 mg | ORAL_TABLET | Freq: Two times a day (BID) | ORAL | Status: DC
  Administered 2022-06-17 – 2022-06-19 (×3): 600 mg via ORAL
  Filled 2022-06-17 (×5): qty 1

## 2022-06-17 NOTE — Progress Notes (Signed)
Daily Progress Note Intern Pager: [phone]  Patient name: Lisa [name] Medical record number: [id number] Date of birth: June 19, 1941 Age: 81 y.o. Gender: female  Primary Care Provider: Patient, No Pcp Per Consultants: None Code Status: Full  Pt Overview and Major Events to Date:  11/7 - admitted  Assessment and Plan:  Lisa [name] is a 81 y.o. female who presented with n/v/d and found to have viral gastroenteritis and pseudomonas bacteremia. 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   * Bacteremia due to Pseudomonas Patient met SIRS criteria with lactic acidosis, slight leukocytosis, slight tachypnea with febrile episode to 100.9. LA resolved to 1.7. Now +pseudomonas on blood culture as well as +rhino/entero as below. On IV cefepime and vanc. Overall stable. Suspect immunosuppression from imatinib for CML and recent hospital stay contributing to infection. -Continue IV cefepime, will stop IV vanc given source of infection identified -Consult ID for further recs -PT OT evaluate and treat -Monitor clinical status and VS  Viral gastroenteritis Likely related to rhino/entero+, reassuringly with unremarkable abdominal exam and normal CT.  -Continue with conservative treatment including antiemetics and analgesics as indicated, likely IV compazine in the future if refractory symptoms given prolonged QTc -Continue tums for indigestion that leads to nausea and vomiting  Macrocytic anemia Appears chronic with CML. However, Hgb dropped to 7.9 this morning. Given her CAD status, her transfusion threshold would be <8. -Repeat CBC 12 PM -Speak with patient about desires for transfusion at borderline level  Ventricular fibrillation ([place]) Previously had been on amiodarone 400 daily in addition to torsemide 20 mg as  directed. -Continue amiodarone 200 mg daily -Continue to hold torsemide for now given softer BP  Lymphedema Chronic, stable. Decreased from last admission. -Continue unna boots  Transaminitis AST and ALT increasing to 72 and 58, respectively. CTAP negative. No abdominal pain. Consider shock liver vs medication induced. -Monitor CMP  Essential hypertension BP with softer DBPs and normal SBP. -Continue holding home BP meds  AKI (acute kidney injury) ([place]) Creatinine 2.04 > 1.62 after 1/2 mIVF since admission. Baseline appears to be 1.2-1.4 meeting criteria for acute kidney injury on CKD. Tenuous volume status with history of heart failure, will continue to monitor. 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 -Continuous cardiac monitoring   Chronic systolic heart failure ([place]) Last TTE 05/2022 with LVEF 45-50% and G2DD. BNP was elevated to 500s on admission, though she does not appear in volume overload. -Continue to monitor fluid status -Continue holding diuretics given BP  Other specified hypothyroidism TSH documented 3 weeks ago was normal. -Continue home Armour 15 mg   FEN/GI: Carb modified PPx: Lovenox Dispo: Pending clinical improvement  Subjective:  Doing much better this morning than yesterday. Still is spitting up phlegm but has not vomited.  Objective: Temp:  [98.5 F (36.9 C)-99.1 F (37.3 C)] 98.9 F (37.2 C) (11/08 1115) Pulse Rate:  [62-94] 68 (11/08 1115) Resp:  [16-27] 19 (11/08 1115) BP: (101-137)/(41-55) 122/52 (11/08 1115) SpO2:  [62 %-100 %] 93 % (11/08 1115) Weight:  [96.6 kg] 96.6 kg (11/07 2059) Physical Exam: General: Alert and oriented, in NAD Skin: Warm, dry, and intact without lesions HEENT: NCAT, EOM grossly  normal, midline nasal septum, coughing and spitting up phlegm Cardiac:  RRR, no m/r/g appreciated Respiratory: CTAB anteriorly, breathing and speaking comfortably on RA Abdominal: Soft, nondistended, normoactive bowel sounds Extremities: Moves all extremities grossly equally in bed; unna boots on bilaterally for lymphedema Neurological: No gross focal deficit Psychiatric: Appropriate mood and affect   Laboratory: Most recent CBC Lab Results  Component Value Date   WBC 16.3 (H) 06/17/2022   HGB 7.6 (L) 06/17/2022   HCT 22.7 (L) 06/17/2022   MCV 96.2 06/17/2022   PLT 186 06/17/2022   Most recent BMP    Latest Ref Rng & Units 06/17/2022    1:36 AM  BMP  Glucose 70 - 99 mg/dL 106   BUN 8 - 23 mg/dL 25   Creatinine 0.44 - 1.00 mg/dL 1.62   Sodium 135 - 145 mmol/L 136   Potassium 3.5 - 5.1 mmol/L 3.5   Chloride 98 - 111 mmol/L 104   CO2 22 - 32 mmol/L 24   Calcium 8.9 - 10.3 mg/dL 7.9    Urinalysis    Component Value Date/Time   COLORURINE YELLOW 06/17/2022 0608   APPEARANCEUR HAZY (A) 06/17/2022 0608   LABSPEC 1.018 06/17/2022 0608   PHURINE 5.0 06/17/2022 0608   GLUCOSEU NEGATIVE 06/17/2022 0608   HGBUR NEGATIVE 06/17/2022 0608   BILIRUBINUR NEGATIVE 06/17/2022 0608   BILIRUBINUR negative 06/04/2016 0847   BILIRUBINUR neg 02/14/2015 1431   KETONESUR NEGATIVE 06/17/2022 0608   PROTEINUR NEGATIVE 06/17/2022 0608   UROBILINOGEN 0.2 06/04/2016 0847   NITRITE NEGATIVE 06/17/2022 0608   LEUKOCYTESUR NEGATIVE 06/17/2022 0608   Imaging/Diagnostic Tests:  CXR IMPRESSION: Retrocardiac airspace opacity may reflect atelectasis or pneumonia. Cardiomegaly and pulmonary vascular congestion.  CT chest, AP IMPRESSION: 1. Small left pleural effusion with overlying atelectasis. 2. Stable mild cardiac enlargement, most notably left atrial and left ventricular enlargement. 3. No acute abdominal/pelvic findings, mass lesions or adenopathy. 4. Cholelithiasis. 5. 16 mm left adrenal gland nodule consistent with a benign adenoma. 6. Advanced  atherosclerotic calcification involving the aorta and iliac arteries. 7. Aortic atherosclerosis.  Ethelene Hal, MD 06/17/2022, 12:12 PM PGY-1, [place] Intern pager: [phone], text pages welcome Secure chat group [place]

## 2022-06-17 NOTE — Progress Notes (Signed)
PT Cancellation Note  Patient Details Name: Lisa [name] MRN: [id number] DOB: 02-09-1941   Cancelled Treatment:    Reason Eval/Treat Not Completed: PT screened, no needs identified, will sign off (In order to avoid duplication of services, per OT pt is Independent walking on unit and has no PT needs as she is at baseline.  Will sign off.)   Alvira Philips 06/17/2022, 8:44 AM Joby Hershkowitz M,PT Acute Rehab Services [phone]

## 2022-06-17 NOTE — Evaluation (Signed)
Occupational Therapy Evaluation/Discharge Patient Details Name: Lisa STRENG MRN: [id number] DOB: Feb 03, 1941 Today's Date: 06/17/2022   History of Present Illness Pt is an 81 y/o female who presented with nausea, vomiting and diarrhea in setting of likely viral gastroenteritis. 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   Clinical Impression   Pt presents with symptoms above though reports feeling much better today. Pt recently DC from SNF rehab to hotel in prep for move to [place] (supposed to be today 11/8). PTA, pt is Modified Independent with ADLs/mobility using Rollator and doing well since rehab. Pt presents now at baseline for ADLs/mobility, implementing energy conservation strategies of when rest breaks needed. VSS on RA pre and post activity. Pt denies any concerns managing daily tasks at DC. No further skilled OT services needed at acute level.       Recommendations for follow up therapy are one component of a multi-disciplinary discharge planning process, led by the attending physician.  Recommendations may be updated based on patient status, additional functional criteria and insurance authorization.   Follow Up Recommendations  No OT follow up    Assistance Recommended at Discharge PRN  Patient can return home with the following Assist for transportation;Assistance with cooking/housework    Functional Status Assessment  Patient has not had a recent decline in their functional status  Equipment Recommendations  None recommended by OT    Recommendations for Other Services       Precautions / Restrictions Precautions Precautions: Fall Precaution Comments: monitor O2, normally uses O2 at night only Restrictions Weight Bearing Restrictions: No      Mobility Bed Mobility Overal bed mobility: Needs Assistance Bed Mobility: Supine to Sit, Sit to Supine     Supine to sit: Modified independent (Device/Increase time), HOB elevated Sit to supine: Min  assist   General bed mobility comments: assist for LE back to bed d/t high stretcher height, typically uses adaptive strategies to get LE in/out of bed at baseline    Transfers Overall transfer level: Modified independent Equipment used: Rolling walker (2 wheels) Transfers: Sit to/from Stand Sit to Stand: Modified independent (Device/Increase time)                  Balance Overall balance assessment: Needs assistance Sitting-balance support: Feet supported, Bilateral upper extremity supported Sitting balance-Leahy Scale: Good     Standing balance support: Bilateral upper extremity supported, During functional activity, No upper extremity supported Standing balance-Leahy Scale: Fair                             ADL either performed or assessed with clinical judgement   ADL Overall ADL's : At baseline;Modified independent                     Lower Body Dressing: Modified independent;Sit to/from stand;Sitting/lateral leans Lower Body Dressing Details (indicate cue type and reason): wears slip on shoes (crocs) for easier mgmt at home             Functional mobility during ADLs: Modified independent;Rolling walker (2 wheels) General ADL Comments: At baseline, fatigues with long distance mobility so uses Rollator at baseline to use seat as needed.     Vision Baseline Vision/History: 1 Wears glasses Ability to See in Adequate Light: 0 Adequate Patient Visual Report: No change from baseline Vision Assessment?: No apparent visual deficits     Perception     Praxis  Pertinent Vitals/Pain Pain Assessment Pain Assessment: No/denies pain     Hand Dominance Right   Extremity/Trunk Assessment Upper Extremity Assessment Upper Extremity Assessment: Overall WFL for tasks assessed   Lower Extremity Assessment Lower Extremity Assessment: Overall WFL for tasks assessed (hx of lymphedema, unna boots applied)   Cervical / Trunk Assessment Cervical  / Trunk Assessment: Normal   Communication Communication Communication: No difficulties   Cognition Arousal/Alertness: Awake/alert Behavior During Therapy: WFL for tasks assessed/performed Overall Cognitive Status: Within Functional Limits for tasks assessed                                       General Comments  BP 120s/40-50s, denies overt dizziness and reports feeling much better than yesterday    Exercises     Shoulder Instructions      Home Living Family/patient expects to be discharged to:: Other (Comment) Living Arrangements: Alone                           Home Equipment: Rollator (4 wheels)   Additional Comments: DC from [facility] rehab recently to hotel awaiting ability to move into [place] (was supposed to move today, 11/8)      Prior Functioning/Environment Prior Level of Function : Independent/Modified Independent             Mobility Comments: uses cane in community previously, using Rolaltor now since DC from rehab ADLs Comments: Mod I for ADLs, does not use AE, cannot drive until March 1443 since ICD firings, family can assist with community tasks as needed        OT Problem List: Decreased activity tolerance      OT Treatment/Interventions:      OT Goals(Current goals can be found in the care plan section) Acute Rehab OT Goals Patient Stated Goal: move to ILF  OT Frequency:      Co-evaluation              AM-PAC OT "6 Clicks" Daily Activity     Outcome Measure Help from another person eating meals?: None Help from another person taking care of personal grooming?: None Help from another person toileting, which includes using toliet, bedpan, or urinal?: None Help from another person bathing (including washing, rinsing, drying)?: None Help from another person to put on and taking off regular upper body clothing?: None Help from another person to put on and taking off regular lower body clothing?:  None 6 Click Score: 24   End of Session Equipment Utilized During Treatment: Gait belt;Rolling walker (2 wheels) Nurse Communication: Mobility status  Activity Tolerance: Patient tolerated treatment well Patient left: in bed;with call bell/phone within reach  OT Visit Diagnosis: Other abnormalities of gait and mobility (R26.89);Muscle weakness (generalized) (M62.81)                Time: 1540-0867 OT Time Calculation (min): 21 min Charges:  OT General Charges $OT Visit: 1 Visit OT Evaluation $OT Eval Low Complexity: 1 Low  Malachy Chamber, OTR/L Acute Rehab Services Office: [phone]   Layla Maw 06/17/2022, 8:01 AM

## 2022-06-17 NOTE — Hospital Course (Addendum)
Lisa [name] is a 81 y.o. female who presented with n/v/d and found to have pseudomonas bacteremia with rhino/entero viral gastroenteritis. 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  Sepsis  Pseudomonas bacteremia Patient admitted initially with SIRS criteria and started on Cefepime and Vancomycin until Pseudomonas bacteremia was discovered on blood cultures. Patient was then narrowed to Cefepime. ID consulted. TTE negative. Ciprofloxacin 500 mg BID started to finish out treatment course on 11/17. By discharge, patient was much improved.  Viral Gastroenteritis Patient initially presented with nausea and vomiting. CT abdomen/pelvis in the ER without acute concerns. On work-up, patient was found to be positive for rhino/enterovirus and this likely contributed to patient's GI symptoms in combination with her bacteremia. Given patient's prolonged QTc, compazine was used to treat nausea. By discharge, her symptoms had improved.  Asymptomatic bacteriuria Ucx resulted with E. faecalis and E. coli <100,000 colonies. E. Faecalis only susceptible to linezolid. Patient did not endorse any dysuria or suprapubic pain. Given she feels well and is ready to go home, did not treat.  Macrocytic anemia Hemoglobin dropped to 7.5 during hospitalization, thought to be dilutional. Transfusion threshold of 8 was used due to CAD and patient was transfused 1 Unit PRBC on 11/8. Hgb remained stable over admission.  Elevated Liver Enzymes LFTs elevated during hospitalization. CT abdomen and pelvis was negative for pathology. Patient was continued on home amiodarone, unclear if it was related to administered medications or shock liver in the setting of sepsis. LFTs improved prior to discharge and patient was continued on amiodarone.  AKI Creatinine elevated upon admission with sepsis and decreased oral  intake due to nausea/vomiting. Patient was given gentle fluids due to history of heart failure. Creatinine improved to 1.15 upon discharge.   Chronic systolic heart failure Patient with AKI and decreased volume status upon admission. Given clinical status and lower blood pressures, fluids were initiated and home diuretics were held. Given improving Cr and BP, home torsemide was restarted by discharge.   CML Patient's home Imatinib was held during hospitalization due to patient's active infection/bacteremia. Patient was instructed to resume once antibiotics were completed on 11/13.  Issues for follow up: Ensure completion of antibiotic therapy on 11/17 Follow up UTI symptoms given Ucx She can resume Imatinib therapy at discharge given she will not finish abx in the one-week hold limit Assess fluid status and lymphedema 16 mm left adrenal gland nodule consistent with a benign adenoma. Advanced atherosclerotic calcification involving the aorta and iliac arteries.

## 2022-06-17 NOTE — ED Notes (Signed)
Patient denies pain and is resting comfortably, snoring lightly.  

## 2022-06-17 NOTE — Plan of Care (Signed)
Arrived at patient's room to discuss result from afternoon lab. Patient was laying down comfortably in bed upon arrival. Informed patient that her afternoon CBC showed decrease in hemoglobin from 7.6 this morning to 7.5. Informed patient given her history of heart disease we will need to transfuse her with 1 unit of RBC.  Patient reports she usually run some where above 8.2 but she is agreeable to transfusion. Denies any dyspnea, lightheadedness or chest pain at this time.  Alen Bleacher, MD PGY-2, [facility] Family Medicine Resident  Please page [phone] with questions.

## 2022-06-17 NOTE — ED Notes (Signed)
Pt reports eating grits and applea\sauce for breakfast. Pt denies nausea.

## 2022-06-17 NOTE — Assessment & Plan Note (Addendum)
Appears chronic. S/p 1u pRBCs with low Hgb; suspect dilutional component. Hgb stable. Patient's onc office states she can be off her imatinib for 1 week. -AM CBC -Continue holding imatinib; must restart by 11/13

## 2022-06-17 NOTE — Assessment & Plan Note (Addendum)
AST and ALT slightly decreasing, overall stable. CTAP negative. No abdominal pain. Consider shock liver vs medication induced. -Monitor CMP

## 2022-06-18 ENCOUNTER — Telehealth: Payer: Self-pay

## 2022-06-18 ENCOUNTER — Inpatient Hospital Stay (HOSPITAL_COMMUNITY): Payer: Medicare Other

## 2022-06-18 DIAGNOSIS — R7881 Bacteremia: Secondary | ICD-10-CM

## 2022-06-18 DIAGNOSIS — B965 Pseudomonas (aeruginosa) (mallei) (pseudomallei) as the cause of diseases classified elsewhere: Secondary | ICD-10-CM | POA: Diagnosis not present

## 2022-06-18 LAB — COMPREHENSIVE METABOLIC PANEL
ALT: 54 U/L — ABNORMAL HIGH (ref 0–44)
AST: 54 U/L — ABNORMAL HIGH (ref 15–41)
Albumin: 2.2 g/dL — ABNORMAL LOW (ref 3.5–5.0)
Alkaline Phosphatase: 62 U/L (ref 38–126)
Anion gap: 6 (ref 5–15)
BUN: 21 mg/dL (ref 8–23)
CO2: 21 mmol/L — ABNORMAL LOW (ref 22–32)
Calcium: 7.9 mg/dL — ABNORMAL LOW (ref 8.9–10.3)
Chloride: 104 mmol/L (ref 98–111)
Creatinine, Ser: 1.3 mg/dL — ABNORMAL HIGH (ref 0.44–1.00)
GFR, Estimated: 42 mL/min — ABNORMAL LOW (ref >60)
Glucose, Bld: 103 mg/dL — ABNORMAL HIGH (ref 70–99)
Potassium: 3.3 mmol/L — ABNORMAL LOW (ref 3.5–5.1)
Sodium: 131 mmol/L — ABNORMAL LOW (ref 135–145)
Total Bilirubin: 2 mg/dL — ABNORMAL HIGH (ref 0.3–1.2)
Total Protein: 5.6 g/dL — ABNORMAL LOW (ref 6.5–8.1)

## 2022-06-18 LAB — CBC
HCT: 26.6 % — ABNORMAL LOW (ref 36.0–46.0)
Hemoglobin: 8.5 g/dL — ABNORMAL LOW (ref 12.0–15.0)
MCH: 30.2 pg (ref 26.0–34.0)
MCHC: 32 g/dL (ref 30.0–36.0)
MCV: 94.7 fL (ref 80.0–100.0)
Platelets: 199 10*3/uL (ref 150–400)
RBC: 2.81 MIL/uL — ABNORMAL LOW (ref 3.87–5.11)
RDW: 18 % — ABNORMAL HIGH (ref 11.5–15.5)
WBC: 11.5 10*3/uL — ABNORMAL HIGH (ref 4.0–10.5)
nRBC: 0 % (ref 0.0–0.2)

## 2022-06-18 LAB — ECHOCARDIOGRAM LIMITED
MV M vel: 5.14 m/s
MV Peak grad: 105.7 mmHg
Radius: 0.5 cm

## 2022-06-18 LAB — MAGNESIUM: Magnesium: 1.7 mg/dL (ref 1.7–2.4)

## 2022-06-18 MED ORDER — POTASSIUM CHLORIDE CRYS ER 20 MEQ PO TBCR
40.0000 meq | EXTENDED_RELEASE_TABLET | Freq: Once | ORAL | Status: AC
  Administered 2022-06-18: 40 meq via ORAL
  Filled 2022-06-18: qty 2

## 2022-06-18 MED ORDER — MAGNESIUM SULFATE IN D5W 1-5 GM/100ML-% IV SOLN
1.0000 g | Freq: Once | INTRAVENOUS | Status: AC
  Administered 2022-06-18: 1 g via INTRAVENOUS
  Filled 2022-06-18: qty 100

## 2022-06-18 MED ORDER — SODIUM CHLORIDE 0.9 % IV SOLN
2.0000 g | Freq: Two times a day (BID) | INTRAVENOUS | Status: DC
  Administered 2022-06-18 – 2022-06-19 (×3): 2 g via INTRAVENOUS
  Filled 2022-06-18 (×3): qty 12.5

## 2022-06-18 MED ORDER — TORSEMIDE 20 MG PO TABS
20.0000 mg | ORAL_TABLET | ORAL | Status: DC
  Administered 2022-06-19: 20 mg via ORAL
  Filled 2022-06-18: qty 1

## 2022-06-18 NOTE — Telephone Encounter (Signed)
Message sent to Oda Kilts who is covering in the hospital. Patient advised and appreciative.

## 2022-06-18 NOTE — Progress Notes (Signed)
Mobility Specialist - Progress Note   06/18/22 0904  Mobility  Activity Ambulated with assistance in hallway  Level of Assistance Standby assist, set-up cues, supervision of patient - no hands on  Assistive Device Front wheel walker  Distance Ambulated (ft) 200 ft  Activity Response Tolerated well  $Mobility charge 1 Mobility    Pt received sitting EOB agreeable to mobility. MinA necessary to help pt get legs into bed after mobility. Left in bed w/ call bell in reach and all needs met.   [place] Specialist Please contact via SecureChat or Rehab office at [phone]

## 2022-06-18 NOTE — Progress Notes (Signed)
FMTS Interim Progress Note  Spoke with patient's outpatient oncology office. They states she can be without her imatinib for up to one week while acutely ill. Given her bacteremia, I feel continuing to hold this medication is appropriate for now.  Ethelene Hal, MD 06/18/2022, 4:07 PM PGY-1, [place] Medicine Service pager [phone]

## 2022-06-18 NOTE — Progress Notes (Signed)
Daily Progress Note Intern Pager: [phone]  Patient name: Lisa [name] Medical record number: [id number] Date of birth: 04-May-1941 Age: 81 y.o. Gender: female  Primary Care Provider: Patient, No Pcp Per Consultants: None Code Status: Full   Pt Overview and Major Events to Date:  11/7 - admitted   Assessment and Plan:   Lisa [name] is a 81 y.o. female who presented with n/v/d and found to have viral gastroenteritis and pseudomonas bacteremia. 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  * Bacteremia due to Pseudomonas Patient met SIRS criteria with lactic acidosis, slight leukocytosis, slight tachypnea with febrile episode to 100.9. LA resolved to 1.7. Now +pseudomonas on blood culture as well as +rhino/entero as below. On IV cefepime and vanc. Overall stable. Suspect immunosuppression from imatinib for CML and recent hospital stay contributing to infection. -Continue IV cefepime -Consulted ID, appreciate recs -PT OT evaluate and treat -Monitor clinical status and VS  Viral gastroenteritis Likely related to rhino/entero+, reassuringly with unremarkable abdominal exam and normal CT.  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 Hgb 7.5 on recheck yesterday; 1u pRBCs given with Hgb this morning 8.5. Suspect dilutional component. Also holding imatinib for now; will consult with onc about restarting given bacteremia. -AM CBC -F/u onc recs about imatinib  Ventricular fibrillation (HCC) Previously had been on amiodarone 400 daily in addition to torsemide 20 mg as directed. Patient  would like her cardiologist to be notified about her presence here. -Continue amiodarone 200 mg daily -Continue to hold torsemide for now given softer BP -Will FYI cards about patient  Lymphedema Chronic, stable. Decreased from last admission. -Continue unna boots  Transaminitis AST and ALT increasing to 72 and 58, respectively. CTAP negative. No abdominal pain. Consider shock liver vs medication induced. -Monitor CMP  Essential hypertension BP with softer DBPs and normal SBP. -Continue holding home BP meds  AKI (acute kidney injury) ([place]) Creatinine 2.04 > 1.62 > 1.30 after 1/2 mIVF since admission. Baseline appears to be 1.2-1.4 meeting criteria for acute kidney injury on CKD. Tenuous volume status with history of heart failure, will continue to monitor. - AM CMP - KVO fluids given she is back to baseline - Avoid nephrotoxic agents - Strict ins and outs  Ventricular tachycardia (HCC) Had episodes in the past though with an ICD; followed closely with cardiology last admission. -Continuous cardiac monitoring   Chronic systolic heart failure ([place]) Last TTE 05/2022 with LVEF 45-50% and G2DD. BNP was elevated to 500s on admission, though she does not appear in volume overload. -Continue to monitor fluid status -Continue holding diuretics given BP  Other specified hypothyroidism TSH documented 3 weeks ago was normal. -Continue home Armour 15 mg   FEN/GI: Carb modified PPx: Lovenox Dispo: Pending clinical improvement  Subjective:  Feels much better than when she came in. Has not felt nauseous or vomited. She was able to walk around the floor which made her feel well.   Objective: Temp:  [98.2 F (36.8 C)-98.9 F (37.2 C)] 98.8 F (37.1 C) (11/09 0558) Pulse Rate:  [65-77] 74 (11/09 0558) Resp:  [17-22] 20 (11/09 0558) BP: (112-145)/(41-52) 145/50 (11/09 0558) SpO2:  [  92 %-98 %] 95 % (11/09 0558) Weight:  [98.4 kg] 98.4 kg (11/09 0558) Physical Exam: General: Alert  and oriented, in NAD Skin: Warm, dry HEENT: NCAT, EOM grossly normal, midline nasal septum Cardiac: RRR, no m/r/g appreciated Respiratory: CTAB anteriorly, breathing and speaking comfortably on RA Abdominal: Soft, very mildly TTP throughout, nondistended, normoactive bowel sounds Extremities: Moves all extremities grossly equally in bed Neurological: No gross focal deficit Psychiatric: Appropriate mood and affect   Laboratory: Most recent CBC Lab Results  Component Value Date   WBC 11.5 (H) 06/18/2022   HGB 8.5 (L) 06/18/2022   HCT 26.6 (L) 06/18/2022   MCV 94.7 06/18/2022   PLT 199 06/18/2022   Most recent BMP    Latest Ref Rng & Units 06/18/2022    6:22 AM  BMP  Glucose 70 - 99 mg/dL 103   BUN 8 - 23 mg/dL 21   Creatinine 0.44 - 1.00 mg/dL 1.30   Sodium 135 - 145 mmol/L 131   Potassium 3.5 - 5.1 mmol/L 3.3   Chloride 98 - 111 mmol/L 104   CO2 22 - 32 mmol/L 21   Calcium 8.9 - 10.3 mg/dL 7.9    Ethelene Hal, MD 06/18/2022, 8:19 AM PGY-1, [place] Intern pager: [phone], text pages welcome Secure chat group [facility] Teaching Service

## 2022-06-18 NOTE — Consult Note (Signed)
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 Presented with N/V. CT chest on 11/7 showed no pulmonary infiltrates, small left pleural effusion. I suspect she may have aspirated and given hospital exposure had PSA PNA. Another Link Snuffer is possible CIED infection(rare) but given she is immunosuppressed will get TTE.  - Her cough is now improving, N/V resolved. I personally reviewed the CT and there appears to be mild infiltrate around pleural effusion which makes me lean towards PsA bacteremia 2/2 pneumonia.  Recommendations:  -Continue cefepime -Sputum Cx ordered, but unlikely to be helpful at this point -TTE. If TTE negative then anticipate 7 days of antibiotics for bacteremia and pneumonia EOT 11/13. Can transition to ciprofloxacin to complete antibiotic coarse pending sensitivities -Immunosuppression per primary(imatinib currently being held)   Microbiology:   Antibiotics: Cefepime 11/7- Vancomycin 11/7-  Cultures: Blood 11/6 2/2 PsA Urine 11/7 pending   HPI: Lisa [name] is a 81 y.o. 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  She presented with nausea vomiting x3 days.  She reports that she only had a couple episodes of diarrhea which have resolved.  Blood cultures grew Pseudomonas aeruginosa, RVP_rhino/entero. ID engaged for management of PsA bacteremia.   Review of Systems: Review of Systems  All other systems reviewed and are negative.   Past Medical History:  Diagnosis Date   CAD in native artery 2017   stent to LAD   Cardiac arrest ([place]) 09/2015   CHF (congestive heart failure) (HCC)    CML (chronic myelocytic leukemia) (HCC)    Complication of anesthesia    Hx: UTI (urinary tract infection)    Hypertension    Melanoma (HCC)    PONV (postoperative nausea and vomiting)    PVC (premature ventricular contraction)    HISTORY OF   Thyroid disease    V tach ([place])     Social History   Tobacco Use   Smoking status: Never   Smokeless tobacco: Never  Vaping Use   Vaping Use: Never used  Substance Use Topics   Alcohol use: No    Alcohol/week: 0.0 standard drinks of alcohol   Drug use: No    Family History  Problem Relation Age of Onset   Coronary artery disease Mother    Thyroid disease Mother    Multiple myeloma Father    Heart attack Maternal Grandfather    Heart attack Paternal Grandfather    Scheduled Meds:  acetaminophen  650 mg Oral Once   amiodarone  200 mg Oral Daily   aspirin EC  81 mg Oral QHS   atorvastatin  20 mg Oral  Daily   enoxaparin (LOVENOX) injection  40 mg Subcutaneous Q24H   guaiFENesin  600 mg Oral BID   thyroid  15 mg Oral QAC breakfast   Continuous Infusions:  sodium chloride 10 mL/hr at 06/17/22 2159   ceFEPime (MAXIPIME) IV 2 g (06/18/22 0926)   PRN Meds:.calcium carbonate Allergies  Allergen Reactions   Flagyl [Metronidazole] Itching, Rash and Other (See Comments)    Welts, also   Penicillins Hives, Itching and Rash    Has patient had a PCN reaction causing immediate rash, facial/tongue/throat swelling, SOB or  lightheadedness with hypotension: Yes  Has patient had a PCN reaction causing severe rash involving mucus membranes or skin necrosis: Yes Has patient had a PCN reaction that required hospitalization No Has patient had a PCN reaction occurring within the last 10 years: NO If all of the above answers are "NO", then may proceed with Cephalosporin use.    Sulfamethoxazole Rash   Meperidine Nausea And Vomiting   Morphine And Related Nausea And Vomiting   Propoxyphene     Other reaction(s): Unknown   Tape Other (See Comments)    "THE PLASTIC, CLEAR TAPE CAN/DOES PULL OFF MY SKIN."   Doxycycline Nausea Only, Rash and Other (See Comments)    Made her "feel terrible"   Lanolin Itching and Rash   Sulfa Antibiotics Rash    OBJECTIVE: Blood pressure (!) 157/64, pulse 78, temperature 98.8 F (37.1 C), temperature source Oral, resp. rate 17, height _0  (1.575 m), weight 98.4 kg, last menstrual period 02/14/1995, SpO2 97 %.  Physical Exam Constitutional:      Appearance: Normal appearance.  HENT:     Head: Normocephalic and atraumatic.     Right Ear: Tympanic membrane normal.     Left Ear: Tympanic membrane normal.     Nose: Nose normal.     Mouth/Throat:     Mouth: Mucous membranes are moist.  Eyes:     Extraocular Movements: Extraocular movements intact.     Conjunctiva/sclera: Conjunctivae normal.     Pupils: Pupils are equal, round, and reactive to light.  Cardiovascular:     Rate and Rhythm: Normal rate and regular rhythm.     Heart sounds: No murmur heard.    No friction rub. No gallop.  Pulmonary:     Effort: Pulmonary effort is normal.     Breath sounds: Normal breath sounds.  Abdominal:     General: Abdomen is flat.     Palpations: Abdomen is soft.  Musculoskeletal:        General: Normal range of motion.  Skin:    General: Skin is warm and dry.  Neurological:     General: No focal deficit present.     Mental Status: She is alert and oriented to person, place, and  time.  Psychiatric:        Mood and Affect: Mood normal.     Lab Results Lab Results  Component Value Date   WBC 11.5 (H) 06/18/2022   HGB 8.5 (L) 06/18/2022   HCT 26.6 (L) 06/18/2022   MCV 94.7 06/18/2022   PLT 199 06/18/2022    Lab Results  Component Value Date   CREATININE 1.30 (H) 06/18/2022   BUN 21 06/18/2022   NA 131 (L) 06/18/2022   K 3.3 (L) 06/18/2022   CL 104 06/18/2022   CO2 21 (L) 06/18/2022    Lab Results  Component Value Date   ALT 54 (H) 06/18/2022   AST 54 (H) 06/18/2022  ALKPHOS 62 06/18/2022   BILITOT 2.0 (H) 06/18/2022       Laurice Record, [place] for Infectious Disease [facility] 06/18/2022, 1:25 PM

## 2022-06-18 NOTE — Telephone Encounter (Signed)
The patient called to let the nurse know she is in the hospital.

## 2022-06-18 NOTE — Progress Notes (Signed)
  Echocardiogram 2D Echocardiogram has been performed.  Lisa [name] 06/18/2022, 4:18 PM

## 2022-06-18 NOTE — Plan of Care (Signed)

## 2022-06-18 NOTE — Progress Notes (Signed)
FMTS Interim Progress Note  S: Paged that pt wants to continue taking her Gleevec (immunosuppresant) and can only miss 2 doses a month. I explained that it is being held in setting of bacteremia and sepsis, but she is still frustrated and wants to restart it because she feels better.  O: BP (!) 138/52   Pulse 73   Temp 98.3 F (36.8 C) (Oral)   Resp 18   Ht '5\' 2"'$  (1.575 m)   Wt 96.6 kg   LMP 02/14/1995   SpO2 95%   BMI 38.96 kg/m     A/P: Psuedomonas Bacteremia  CML - Pt agreeable to holding [facility] - Pt requests day team discuss decision to hold [place] with her Oncologist  Arlyce Dice, MD 06/18/2022, 12:25 AM PGY-1, [place] Medicine Service pager [phone]

## 2022-06-18 NOTE — TOC Initial Note (Signed)
Transition of Care [facility]) - Initial/Assessment Note    Patient Details  Name: Lisa [name] MRN: [id number] Date of Birth: 1941/03/29  Transition of [facility]) CM/SW Contact:    Marilu Favre, RN Phone Number: 06/18/2022, 12:18 PM  Clinical Narrative:                  Spoke to patient at bedside. Patient states she has made arrangements to go to [place] at discharge while her "house gets worked on".   ED documented she is from hotel. Patient stated she only sent one night in hotel . She has [employer] at home .   PCP is Chesley Noon , however first appointment is scheduled for Monday Jun 22, 2022   Patient has Louretta Parma boots changed at lymphoedema clinic   Await PT evaluation  Expected Discharge Plan: Home/Self Care     Patient Goals and CMS Choice Patient states their goals for this hospitalization and ongoing recovery are:: patient plans to go to Independent Living at [facility]      Expected Discharge Plan and Services Expected Discharge Plan: Home/Self Care   Discharge Planning Services: CM Consult                       DME Agency:  (await PT eval)       HH Arranged:  (await PT eval)          Prior Living Arrangements/Services   Lives with:: Self Patient language and need for interpreter reviewed:: Yes            Current home services: DME Criminal Activity/Legal Involvement Pertinent to Current Situation/Hospitalization: No - Comment as needed  Activities of Daily Living Home Assistive Devices/Equipment: Gilford Rile (specify type) ADL Screening (condition at time of admission) Is the patient deaf or have difficulty hearing?: No Does the patient have difficulty seeing, even when wearing glasses/contacts?: No Does the patient have difficulty concentrating, remembering, or making decisions?: No Patient able to express need for assistance with ADLs?: Yes Does the patient have difficulty dressing or  bathing?: No Independently performs ADLs?: No Does the patient have difficulty walking or climbing stairs?: No Weakness of Legs: Both Weakness of Arms/Hands: None  Permission Sought/Granted   Permission granted to share information with : No              Emotional Assessment Appearance:: Appears stated age Attitude/Demeanor/Rapport: Engaged Affect (typically observed): Accepting Orientation: : Oriented to Self, Oriented to Place, Oriented to  Time, Oriented to Situation Alcohol / Substance Use: Not Applicable Psych Involvement: No (comment)  Admission diagnosis:  Nausea & vomiting [R11.2] Nausea vomiting and diarrhea [R11.2, R19.7] Bacteremia due to Pseudomonas [R78.81, B96.5] Fever, unspecified fever cause [R50.9] Patient Active Problem List   Diagnosis Date Noted   Transaminitis 06/17/2022   Viral gastroenteritis 06/16/2022   Bacteremia due to Pseudomonas 06/16/2022   Venous stasis dermatitis of both lower extremities 05/26/2022   Acute on chronic systolic heart failure (HCC)    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 05/22/2022   AKI (acute kidney injury) ([place]) 05/22/2022   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   Ventricular tachycardia ([place]) 09/21/2019   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   Unstable angina ([place]) 09/32/3557   Chronic systolic heart failure (HCC)    PVC's (premature ventricular contractions)    Ventricular fibrillation ([place]) 10/03/2015   Cardiac arrest ([place]) 10/03/2015   Essential hypertension 09/09/2015   Lymphedema  02/21/2015   Other specified hypothyroidism 02/21/2015   PCP:  Patient, No Pcp Per Pharmacy:   [facility] [id number] [place] [address] [place] [address] Phone: [phone] Fax: [phone]  [id number]: [phone]: [phone]    Social  Determinants of Health (SDOH) Interventions    Readmission Risk Interventions     No data to display

## 2022-06-18 NOTE — Progress Notes (Signed)
Pharmacy Antibiotic Note  Lisa [name] is a 81 y.o. female admitted on 06/16/2022 with concerns for sepsis now found to have pseudomonal bacteremia. Pharmacy has been consulted for cefepime dosing.  Patient had an AKI on admission with Scr 2.04, now improved to 1.3 (baseline close to 1.2). Will adjust cefepime dosing.   Plan: Cefepime 2g IV Q12H Trend WBC, fever, renal function F/u cultures, clinical progress   Height: '5\' 2"'$  (157.5 cm) Weight: 98.4 kg (216 lb 14.9 oz) IBW/kg (Calculated) : 50.1  Temp (24hrs), Avg:98.5 F (36.9 C), Min:98.2 F (36.8 C), Max:98.9 F (37.2 C)  Recent Labs  Lab 06/16/22 0232 06/16/22 0550 06/16/22 0730 06/16/22 1749 06/16/22 2109 06/17/22 0136 06/17/22 1200 06/18/22 0622  WBC 11.5*  --   --   --   --  16.3* 15.2* 11.5*  CREATININE 2.04*  --   --   --   --  1.62*  --  1.30*  LATICACIDVEN  --  3.5* 3.1* 3.3* 1.7  --   --   --     Estimated Creatinine Clearance: 37.8 mL/min (A) (by C-G formula based on SCr of 1.3 mg/dL (H)).    Allergies  Allergen Reactions   Flagyl [Metronidazole] Itching, Rash and Other (See Comments)    Welts, also   Penicillins Hives, Itching and Rash    Has patient had a PCN reaction causing immediate rash, facial/tongue/throat swelling, SOB or lightheadedness with hypotension: Yes  Has patient had a PCN reaction causing severe rash involving mucus membranes or skin necrosis: Yes Has patient had a PCN reaction that required hospitalization No Has patient had a PCN reaction occurring within the last 10 years: NO If all of the above answers are "NO", then may proceed with Cephalosporin use.    Sulfamethoxazole Rash   Meperidine Nausea And Vomiting   Morphine And Related Nausea And Vomiting   Propoxyphene     Other reaction(s): Unknown   Tape Other (See Comments)    "THE PLASTIC, CLEAR TAPE CAN/DOES PULL OFF MY SKIN."   Doxycycline Nausea Only, Rash and Other (See Comments)    Made her "feel terrible"    Lanolin Itching and Rash   Sulfa Antibiotics Rash    Antimicrobials this admission: vancomycin 11/07 >>  cefepime 11/07 >>   Microbiology results: 11/07 BCx: pseudomonas aeruginosa 11/08 Ucx: sent   Thank you for allowing pharmacy to be a part of this patient's care.  Ardyth Harps, PharmD Clinical Pharmacist

## 2022-06-19 ENCOUNTER — Other Ambulatory Visit (HOSPITAL_COMMUNITY): Payer: Self-pay

## 2022-06-19 DIAGNOSIS — B965 Pseudomonas (aeruginosa) (mallei) (pseudomallei) as the cause of diseases classified elsewhere: Secondary | ICD-10-CM | POA: Diagnosis not present

## 2022-06-19 DIAGNOSIS — R8279 Other abnormal findings on microbiological examination of urine: Secondary | ICD-10-CM

## 2022-06-19 DIAGNOSIS — R7881 Bacteremia: Secondary | ICD-10-CM | POA: Diagnosis not present

## 2022-06-19 LAB — BPAM RBC
Blood Product Expiration Date: 202311122359
ISSUE DATE / TIME: 202311090027
Unit Type and Rh: 7300

## 2022-06-19 LAB — CULTURE, BLOOD (ROUTINE X 2)
Special Requests: ADEQUATE
Special Requests: ADEQUATE

## 2022-06-19 LAB — TYPE AND SCREEN
ABO/RH(D): B POS
Antibody Screen: NEGATIVE
Unit division: 0

## 2022-06-19 LAB — URINE CULTURE: Culture: 40000 — AB

## 2022-06-19 LAB — COMPREHENSIVE METABOLIC PANEL
ALT: 51 U/L — ABNORMAL HIGH (ref 0–44)
AST: 51 U/L — ABNORMAL HIGH (ref 15–41)
Albumin: 2.4 g/dL — ABNORMAL LOW (ref 3.5–5.0)
Alkaline Phosphatase: 59 U/L (ref 38–126)
Anion gap: 6 (ref 5–15)
BUN: 17 mg/dL (ref 8–23)
CO2: 23 mmol/L (ref 22–32)
Calcium: 8.3 mg/dL — ABNORMAL LOW (ref 8.9–10.3)
Chloride: 107 mmol/L (ref 98–111)
Creatinine, Ser: 1.15 mg/dL — ABNORMAL HIGH (ref 0.44–1.00)
GFR, Estimated: 48 mL/min — ABNORMAL LOW (ref >60)
Glucose, Bld: 110 mg/dL — ABNORMAL HIGH (ref 70–99)
Potassium: 3.6 mmol/L (ref 3.5–5.1)
Sodium: 136 mmol/L (ref 135–145)
Total Bilirubin: 1 mg/dL (ref 0.3–1.2)
Total Protein: 5.6 g/dL — ABNORMAL LOW (ref 6.5–8.1)

## 2022-06-19 LAB — CBC
HCT: 29.1 % — ABNORMAL LOW (ref 36.0–46.0)
Hemoglobin: 9.3 g/dL — ABNORMAL LOW (ref 12.0–15.0)
MCH: 30.1 pg (ref 26.0–34.0)
MCHC: 32 g/dL (ref 30.0–36.0)
MCV: 94.2 fL (ref 80.0–100.0)
Platelets: 228 10*3/uL (ref 150–400)
RBC: 3.09 MIL/uL — ABNORMAL LOW (ref 3.87–5.11)
RDW: 18.4 % — ABNORMAL HIGH (ref 11.5–15.5)
WBC: 8 10*3/uL (ref 4.0–10.5)
nRBC: 0 % (ref 0.0–0.2)

## 2022-06-19 LAB — MAGNESIUM: Magnesium: 1.9 mg/dL (ref 1.7–2.4)

## 2022-06-19 MED ORDER — CIPROFLOXACIN HCL 500 MG PO TABS
500.0000 mg | ORAL_TABLET | Freq: Two times a day (BID) | ORAL | 0 refills | Status: AC
  Filled 2022-06-19: qty 14, 7d supply, fill #0

## 2022-06-19 MED ORDER — POTASSIUM CHLORIDE CRYS ER 20 MEQ PO TBCR
40.0000 meq | EXTENDED_RELEASE_TABLET | Freq: Once | ORAL | Status: AC
  Administered 2022-06-19: 40 meq via ORAL
  Filled 2022-06-19: qty 2

## 2022-06-19 MED ORDER — CIPROFLOXACIN HCL 500 MG PO TABS
500.0000 mg | ORAL_TABLET | Freq: Two times a day (BID) | ORAL | Status: DC
  Administered 2022-06-19: 500 mg via ORAL
  Filled 2022-06-19: qty 1

## 2022-06-19 MED ORDER — MAGNESIUM SULFATE 2 GM/50ML IV SOLN
2.0000 g | Freq: Once | INTRAVENOUS | Status: AC
  Administered 2022-06-19: 2 g via INTRAVENOUS
  Filled 2022-06-19: qty 50

## 2022-06-19 MED ORDER — GUAIFENESIN ER 600 MG PO TB12: 600.0000 mg | ORAL_TABLET | Freq: Two times a day (BID) | ORAL | 0 refills | Status: DC

## 2022-06-19 MED ORDER — PROCHLORPERAZINE MALEATE 5 MG PO TABS
5.0000 mg | ORAL_TABLET | Freq: Four times a day (QID) | ORAL | Status: DC | PRN
  Administered 2022-06-19: 5 mg via ORAL
  Filled 2022-06-19 (×2): qty 1

## 2022-06-19 MED ORDER — PROCHLORPERAZINE MALEATE 5 MG PO TABS
5.0000 mg | ORAL_TABLET | Freq: Four times a day (QID) | ORAL | 0 refills | Status: DC | PRN
  Filled 2022-06-19: qty 30, 8d supply, fill #0

## 2022-06-19 NOTE — Discharge Instructions (Signed)
Dear Lisa [name],  Thank you for letting us participate in your care. You were hospitalized for nausea, vomiting, and diarrhea and diagnosed with Bacteremia due to Pseudomonas and viral gastroenteritis. You were treated with ciprofloxacin and IV cefepime for this bacteremia.   POST-HOSPITAL & CARE INSTRUCTIONS Please complete your antibiotics. Last dose should be on 11/17. You should continue to improve from your viral infection. Go to your follow up appointments (listed below)  DOCTOR'S APPOINTMENT   Future Appointments  Date Time Provider [place]  06/29/2022 10:40 AM Shirley Friar, PA-C [facility]  07/13/2022  7:25 AM [facility] DEVICE REMOTES [facility]  07/29/2022  8:55 AM [facility] DEVICE REMOTES [facility]  09/01/2022  3:00 PM [name], MD [facility]  10/28/2022  8:55 AM [facility] DEVICE REMOTES [facility]  01/27/2023  8:55 AM [facility] DEVICE REMOTES [facility]  04/28/2023  8:55 AM [facility] DEVICE REMOTES [facility]  07/28/2023  8:55 AM [facility] DEVICE REMOTES [facility]  10/27/2023  8:55 AM [facility] DEVICE REMOTES [facility]    Follow-up Information     Chesley Noon, MD. Schedule an appointment as soon as possible for a visit.   Specialty: Family Medicine Contact information: [place] [address] [phone]                 Take care and be well!  [facility]  [place], [place] [address] [phone]

## 2022-06-19 NOTE — Progress Notes (Signed)
Brief ID note   18 YF admitted with:     #Pseudomonas aeruginosa bacteremia  #AICD #CML on imitanib x 5 years #RVP+ rhinovirus/enterovirus - Pt was recently admitted 10/12 for PNA treated with ceftriaxone and azithromycin. Presented with N/V. CT chest on 11/7 showed no pulmonary infiltrates, small left pleural effusion. I suspect she may have aspirated and given hospital exposure had PSA PNA. Another Link Snuffer is possible CIED infection(rare) but given she is immunosuppressed will get TTE.  - Her cough is now improving, N/V resolved. I personally reviewed the CT and there appears to be mild infiltrate around pleural effusion which makes me lean towards PsA bacteremia 2/2 pneumonia. \ -TTE negative -PsA res to cefepime (MIC 16). As pt is doing clinically well, will repeat blood Cc nad treat with 7 days of cefepime.  Recommendations: \ -D/C cefepime -Start cipro  x 7 days -Follow repeat blood Cx -Follow-up with ID on 11/20

## 2022-06-19 NOTE — TOC Progression Note (Signed)
Transition of Care [facility]) - Progression Note    Patient Details  Name: Lisa [name] MRN: [id number] Date of Birth: 28-Mar-1941  Transition of Care [facility]) CM/SW Contact  Latarshia Jersey, Edson Snowball, RN Phone Number: 06/19/2022, 10:36 AM  Clinical Narrative:     Velna Hatchet from [facility] called , patient active with them for RN,PT,OT will need resumption of care orders   Expected Discharge Plan: Home/Self Care    Expected Discharge Plan and Services Expected Discharge Plan: Home/Self Care   Discharge Planning Services: CM Consult                       DME Agency:  (await PT eval)       HH Arranged:  (await PT eval)           Social Determinants of Health (SDOH) Interventions    Readmission Risk Interventions     No data to display

## 2022-06-19 NOTE — Progress Notes (Signed)
Orthopedic Tech Progress Note Patient Details:  Lisa [name] March 29, 1941 [id number]  Ortho Devices Type of Ortho Device: Haematologist Ortho Device/Splint Location: BLE Ortho Device/Splint Interventions: Ordered, Application   Post Interventions Patient Tolerated: Well  Taren Dymek A [name] 06/19/2022, 2:44 PM

## 2022-06-19 NOTE — Progress Notes (Signed)
Daily Progress Note Intern Pager: [phone]  Patient name: Lisa [name] Medical record number: [id number] Date of birth: June 28, 1941 Age: 81 y.o. Gender: female  Primary Care Provider: Chesley Noon, MD Consultants: None Code Status: Full   Pt Overview and Major Events to Date:  11/7 - admitted   Assessment and Plan:   Lisa [name] is a 81 y.o. female who presented with n/v/d and found to have viral gastroenteritis and pseudomonas bacteremia. 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  * Bacteremia due to Pseudomonas Stable. On IV cefepime and vanc. Suspect immunosuppression from imatinib for CML and recent hospital stay contributing to infection. ID obtained TTE which was overall negative; suspect total 7 day course of abx. -Continue IV cefepime for now; once sensitivities result, can switch to cipro until EOT 11/13, appreciate ID recs -PT OT evaluate and treat -Monitor clinical status and VS  Viral gastroenteritis Likely related to rhino/entero+, reassuringly with unremarkable abdominal exam and normal CT.  -Continue with conservative treatment including antiemetics and analgesics as indicated, likely IV compazine in the future if refractory symptoms given prolonged QTc -Continue tums for indigestion that leads to nausea and vomiting  Urine culture positive Ucx now with enterococcus faecium resistant to vanc and yeast. Patient has endorsed improved abdominal pain. Given she is not having symptoms, will hold off on abx for now. 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 S/p 1u pRBCs with low Hgb; suspect dilutional component. Hgb stable. Patient's onc office states she can be off her imatinib for 1 week. -AM CBC -Continue holding imatinib; must restart by 11/13  Ventricular  fibrillation (HCC) Previously had been on amiodarone 400 daily in addition to torsemide 20 mg as directed. Patient would like her cardiologist to be notified about her presence here. -Continue amiodarone 200 mg daily -Continue to hold torsemide for now given softer BP -FYI'd cards about patient  Lymphedema Chronic, stable. Decreased from last admission. -Continue unna boots  Transaminitis AST and ALT slightly decreasing, overall stable. CTAP negative. No abdominal pain. Consider shock liver vs medication induced. -Monitor CMP  Essential hypertension BP improving, now slightly hypertensive. -Continue metoprolol, consider adding additional BP med given rise in BPs  AKI (acute kidney injury) ([place]) Creatinine 2.04 on admission and now 1.15. Tenuous volume status with history of heart failure, will continue to monitor. - AM CMP - KVO fluids given she is back to baseline - Avoid nephrotoxic agents - Strict ins and outs  Ventricular tachycardia (HCC) Had episodes in the past though with an ICD; followed closely with cardiology last admission. -Continuous cardiac monitoring   Chronic systolic heart failure ([place]) Last TTE 05/2022 with LVEF 45-50% and G2DD. BNP was elevated to 500s on admission, though she does not appear in volume overload. -Continue to monitor fluid status -Will restart diuretics given good BP and Cr  Other specified hypothyroidism TSH documented 3 weeks ago was normal. -Continue home Armour 15 mg  Repleted K and Mg.  FEN/GI: Carb modified PPx: Lovenox Dispo: Pending clinical improvement  Subjective:  Doing well this morning. Would like to go home as soon as she can.  Objective: Temp:  [98.1 F (36.7 C)-98.9 F (37.2 C)] 98.2 F (36.8 C) (11/10 0700) Pulse Rate:  [71-93] 72 (11/10 0700) Resp:  [18-20] 18 (11/09 2210) BP: (144-152)/(58-68) 152/68 (11/10  0700) SpO2:  [93 %-97 %] 93 % (11/10 0700) Physical Exam: General: Alert and oriented, in NAD Skin:  Warm, dry HEENT: NCAT, EOM grossly normal, midline nasal septum Cardiac: Regular rate Respiratory: Breathing and speaking comfortably on RA Abdominal: Nondistended Extremities: Moves all extremities grossly equally Neurological: No gross focal deficit Psychiatric: Appropriate mood and affect   Laboratory: Most recent CBC Lab Results  Component Value Date   WBC 8.0 06/19/2022   HGB 9.3 (L) 06/19/2022   HCT 29.1 (L) 06/19/2022   MCV 94.2 06/19/2022   PLT 228 06/19/2022   Most recent BMP    Latest Ref Rng & Units 06/19/2022    3:03 AM  BMP  Glucose 70 - 99 mg/dL 110   BUN 8 - 23 mg/dL 17   Creatinine 0.44 - 1.00 mg/dL 1.15   Sodium 135 - 145 mmol/L 136   Potassium 3.5 - 5.1 mmol/L 3.6   Chloride 98 - 111 mmol/L 107   CO2 22 - 32 mmol/L 23   Calcium 8.9 - 10.3 mg/dL 8.3    Mg 1.9  Ethelene Hal, MD 06/19/2022, 9:41 AM PGY-1, [place] Intern pager: [phone], text pages welcome Secure chat group [place]

## 2022-06-19 NOTE — Assessment & Plan Note (Signed)
Ucx now with enterococcus faecium resistant to vanc and yeast. Patient has endorsed improved abdominal pain. Given she is not having symptoms, will hold off on abx for now. -Monitor sx

## 2022-06-19 NOTE — Discharge Summary (Signed)
[facility] Discharge Summary  Patient name: Lisa [name] record number: [id number] Date of birth: Jun 11, 1941 Age: 81 y.o. Gender: female Date of Admission: 06/16/2022  Date of Discharge: 06/19/2022 Admitting Physician: Lisa Grip, MD  Primary Care Provider: Chesley Noon, MD Consultants: ID  Indication for Hospitalization: Pseudomonas bacteremia  Brief Hospital Course:  Lisa [name] is a 81 y.o. female who presented with n/v/d and found to have pseudomonas bacteremia with rhino/entero viral gastroenteritis. 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  Sepsis  Pseudomonas bacteremia Patient admitted initially with SIRS criteria and started on Cefepime and Vancomycin until Pseudomonas bacteremia was discovered on blood cultures. Patient was then narrowed to Cefepime. ID consulted. TTE negative. Ciprofloxacin 500 mg BID started to finish out treatment course on 11/17. By discharge, patient was much improved.  Viral Gastroenteritis Patient initially presented with nausea and vomiting. CT abdomen/pelvis in the ER without acute concerns. On work-up, patient was found to be positive for rhino/enterovirus and this likely contributed to patient's GI symptoms in combination with her bacteremia. Given patient's prolonged QTc, compazine was used to treat nausea. By discharge, her symptoms had improved.  Asymptomatic bacteriuria Ucx resulted with E. faecalis and E. coli <100,000 colonies. E. Faecalis only susceptible to linezolid. Patient did not endorse any dysuria or suprapubic pain. Given she feels well and is ready to go home, did not treat.  Macrocytic anemia Hemoglobin dropped to 7.5 during hospitalization, thought to be dilutional. Transfusion threshold of 8 was used due to CAD and patient was transfused 1 Unit PRBC on 11/8. Hgb  remained stable over admission.  Elevated Liver Enzymes LFTs elevated during hospitalization. CT abdomen and pelvis was negative for pathology. Patient was continued on home amiodarone, unclear if it was related to administered medications or shock liver in the setting of sepsis. LFTs improved prior to discharge and patient was continued on amiodarone.  AKI Creatinine elevated upon admission with sepsis and decreased oral intake due to nausea/vomiting. Patient was given gentle fluids due to history of heart failure. Creatinine improved to 1.15 upon discharge.   Chronic systolic heart failure Patient with AKI and decreased volume status upon admission. Given clinical status and lower blood pressures, fluids were initiated and home diuretics were held. Given improving Cr and BP, home torsemide was restarted by discharge.   CML Patient's home Imatinib was held during hospitalization due to patient's active infection/bacteremia. Patient was instructed to resume once antibiotics were completed on 11/13.  Issues for follow up: Ensure completion of antibiotic therapy on 11/17 Follow up UTI symptoms given Ucx She can resume Imatinib therapy at discharge given she will not finish abx in the one-week hold limit Assess fluid status and lymphedema 16 mm left adrenal gland nodule consistent with a benign adenoma. Advanced atherosclerotic calcification involving the aorta and iliac arteries.  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 rate 18, height '5\' 2"'$  (1.575 m), weight 98.4  kg, last menstrual period 02/14/1995, SpO2 93 %.  General: Alert and oriented, in NAD Skin: Warm, dry HEENT: NCAT, EOM grossly normal, midline nasal septum Cardiac: Regular rate Respiratory: Breathing and speaking comfortably on RA Abdominal: Nondistended Extremities: Moves all extremities grossly equally Neurological: No gross focal deficit Psychiatric: Appropriate mood and affect   Significant Labs and Imaging:  Recent Labs  Lab 06/18/22 0622 06/19/22 0303  WBC 11.5* 8.0  HGB 8.5* 9.3*  HCT 26.6* 29.1*  PLT 199 228   Recent Labs  Lab 06/18/22 0622 06/19/22 0303  NA 131* 136  K 3.3* 3.6  CL 104 107  CO2 21* 23  GLUCOSE 103* 110*  BUN 21 17  CREATININE 1.30* 1.15*  CALCIUM 7.9* 8.3*  MG 1.7 1.9  ALKPHOS 62 59  AST 54* 51*  ALT 54* 51*  ALBUMIN 2.2* 2.4*   ECHOCARDIOGRAM LIMITED  Result Date: 06/18/2022    ECHOCARDIOGRAM LIMITED REPORT   Patient Name:   Lisa [name] Date of Exam: 06/18/2022 Medical Rec #:  [id number]          Height:       62.0 in Accession #:    [id number]         Weight:       216.9 lb Date of Birth:  [birth year]           BSA:          1.979 m Patient Age:    81 years           BP:           151/62 mmHg Patient Gender: F                  HR:           74 bpm. Exam Location:  Inpatient Procedure: Limited Echo, Limited Color Doppler and Cardiac Doppler Indications:    Bacteremia  History:        Patient has prior history of Echocardiogram examinations, most                 recent 05/22/2022. Defibrillator and Pacemaker,                 Arrythmias:Ventricular Fibrillation; Risk Factors:Hypertension.  Sonographer:    Johny Chess RDCS Referring Phys: [id number] [facility]  1. Left ventricular ejection fraction, by estimation, is 45 to 50%. The left ventricle has mildly decreased function. The left ventricle demonstrates global hypokinesis.  2. Right  ventricular systolic function is normal. The right ventricular size is normal.  3. The mitral valve is abnormal. Mild mitral valve regurgitation. No evidence of mitral stenosis.  4. The aortic valve is tricuspid. There is moderate calcification of the aortic valve. Aortic valve regurgitation is not visualized. Aortic valve sclerosis/calcification is present, without any evidence of aortic stenosis.  5. The inferior vena cava is normal in size with greater than 50% respiratory variability, suggesting right atrial pressure of 3 mmHg. FINDINGS  Left Ventricle: Left ventricular ejection fraction, by estimation, is 45 to 50%. The left ventricle has mildly decreased function. The left ventricle demonstrates global hypokinesis. The left ventricular internal cavity size was normal in size. There is  no left ventricular hypertrophy. Right Ventricle: The right ventricular size is normal. No increase in right ventricular wall thickness. Right ventricular systolic function is normal. Left Atrium: Left atrial size was normal in size. Right Atrium: Right atrial size was normal in size. Pericardium: There is no evidence of  pericardial effusion. Mitral Valve: The mitral valve is abnormal. There is mild thickening of the mitral valve leaflet(s). There is mild calcification of the mitral valve leaflet(s). Mild mitral valve regurgitation. No evidence of mitral valve stenosis. Tricuspid Valve: The tricuspid valve is normal in structure. Tricuspid valve regurgitation is trivial. No evidence of tricuspid stenosis. Aortic Valve: The aortic valve is tricuspid. There is moderate calcification of the aortic valve. Aortic valve regurgitation is not visualized. Aortic valve sclerosis/calcification is present, without any evidence of aortic stenosis. Pulmonic Valve: The pulmonic valve was normal in structure. Pulmonic valve regurgitation is trivial. No evidence of pulmonic stenosis. Aorta: The aortic root is normal in size and structure.  Venous: The inferior vena cava is normal in size with greater than 50% respiratory variability, suggesting right atrial pressure of 3 mmHg. IAS/Shunts: No atrial level shunt detected by color flow Doppler. Additional Comments: Spectral Doppler performed. Color Doppler performed.  IVC IVC diam: 2.20 cm MR Peak grad:    105.7 mmHg   TRICUSPID VALVE MR Mean grad:    70.0 mmHg    TR Peak grad:   23.4 mmHg MR Vmax:         514.00 cm/s  TR Vmax:        242.00 cm/s MR Vmean:        393.0 cm/s MR PISA:         1.57 cm MR PISA Eff ROA: 12 mm MR PISA Radius:  0.50 cm Jenkins Rouge MD Electronically signed by Jenkins Rouge MD Signature Date/Time: 06/18/2022/4:18:27 PM    Final    CT CHEST ABDOMEN PELVIS WO CONTRAST  Result Date: 06/16/2022 CLINICAL DATA:  Nausea and vomiting. EXAM: CT CHEST, ABDOMEN AND PELVIS WITHOUT CONTRAST TECHNIQUE: Multidetector CT imaging of the chest, abdomen and pelvis was performed following the standard protocol without IV contrast. RADIATION DOSE REDUCTION: This exam was performed according to the departmental dose-optimization program which includes automated exposure control, adjustment of the mA and/or kV according to patient size and/or use of iterative reconstruction technique. COMPARISON:  CT scan 11/16/2020 FINDINGS: CT CHEST FINDINGS Cardiovascular: Stable mild cardiac enlargement, most notably left atrial and left ventricular enlargement. Pacer wires/AICD are stable. No periportal or effusion. Stable aortic and coronary artery calcifications. Mediastinum/Nodes: No mediastinal or hilar mass or lymphadenopathy. The esophagus is grossly normal. Small hiatal hernia. Lungs/Pleura: Small left pleural effusion with overlying atelectasis. No pulmonary edema or pulmonary infiltrates. No worrisome pulmonary lesions or nodules. Musculoskeletal: Rim calcified bilateral breast prostheses. No supraclavicular or axillary adenopathy. The thyroid gland is unremarkable. The bony thorax is intact.  Exaggerated thoracic kyphosis and degenerative changes involving the thoracic spine. CT ABDOMEN PELVIS FINDINGS Hepatobiliary: No hepatic lesions or intrahepatic biliary dilatation. Small hepatic cyst noted in segment 6. Small calcified gallstone noted in the gallbladder. No CT findings to suggest acute cholecystitis. Pancreas: No mass, inflammation or ductal dilatation. Spleen: Normal size.  No focal lesions. Adrenals/Urinary Tract: 16 mm left adrenal gland nodule measures 13 Hounsfield units and is consistent with a benign adenoma. The right adrenal gland is normal. No renal lesions are identified without contrast. No renal or obstructing ureteral calculi. Bladder is grossly normal. Stomach/Bowel: The stomach, duodenum, small bowel and colon are grossly normal. No inflammatory changes or obstructive findings. Scattered high attenuation material in the bowel likely due to ingested material such as Tums or Pepto-Bismol. Vascular/Lymphatic: Advanced atherosclerotic calcification involving the aorta and iliac arteries but no aneurysm. Small scattered mesenteric and retroperitoneal lymph nodes but no mass  or adenopathy. Reproductive: Surgically absent. Other: No pelvic mass or adenopathy. No free pelvic fluid collections. No inguinal mass or adenopathy. No abdominal wall hernia or subcutaneous lesions. Musculoskeletal: No significant bony findings. Degenerative facet disease noted in the lower lumbar spine. IMPRESSION: 1. Small left pleural effusion with overlying atelectasis. 2. Stable mild cardiac enlargement, most notably left atrial and left ventricular enlargement. 3. No acute abdominal/pelvic findings, mass lesions or adenopathy. 4. Cholelithiasis. 5. 16 mm left adrenal gland nodule consistent with a benign adenoma. 6. Advanced atherosclerotic calcification involving the aorta and iliac arteries. 7. Aortic atherosclerosis. Aortic Atherosclerosis (ICD10-I70.0). Electronically Signed   By: Marijo Sanes M.D.   On:  06/16/2022 08:27   DG Chest 2 View  Result Date: 06/16/2022 CLINICAL DATA:  Nausea, vomiting, diarrhea, chest tightness EXAM: CHEST - 2 VIEW COMPARISON:  Radiograph 05/21/2022 FINDINGS: Retrocardiac airspace opacity. Probable small left pleural effusion. The right lung is clear. No pneumothorax. Stable enlargement of the cardiomediastinal silhouette. Chronic vascular congestion. Left chest wall CRT-D. No acute osseous abnormality. Aortic atherosclerotic calcification. IMPRESSION: Retrocardiac airspace opacity may reflect atelectasis or pneumonia. Cardiomegaly and pulmonary vascular congestion. Electronically Signed   By: Placido Sou M.D.   On: 06/16/2022 03:15   Discharge Medications:  Allergies as of 06/19/2022       Reactions   Flagyl [metronidazole] Itching, Rash, Other (See Comments)   Welts, also   Penicillins Hives, Itching, Rash   Has patient had a PCN reaction causing immediate rash, facial/tongue/throat swelling, SOB or lightheadedness with hypotension: Yes  Has patient had a PCN reaction causing severe rash involving mucus membranes or skin necrosis: Yes Has patient had a PCN reaction that required hospitalization No Has patient had a PCN reaction occurring within the last 10 years: NO If all of the above answers are "NO", then may proceed with Cephalosporin use.   Sulfamethoxazole Rash   Meperidine Nausea And Vomiting   Morphine And Related Nausea And Vomiting   Propoxyphene    Other reaction(s): Unknown   Tape Other (See Comments)   "THE PLASTIC, CLEAR TAPE CAN/DOES PULL OFF MY SKIN."   Doxycycline Nausea Only, Rash, Other (See Comments)   Made her "feel terrible"   Lanolin Itching, Rash   Sulfa Antibiotics Rash        Medication List     TAKE these medications    acetaminophen 500 MG tablet Commonly known as: TYLENOL Take 500-1,000 mg by mouth every 6 (six) hours as needed for moderate pain.   ALIGN PO Take 1 tablet by mouth daily.   allopurinol 100 MG  tablet Commonly known as: ZYLOPRIM Take 100 mg by mouth daily.   amiodarone 200 MG tablet Commonly known as: PACERONE Take 200 mg by mouth daily. What changed: Another medication with the same name was removed. Continue taking this medication, and follow the directions you see here.   ascorbic acid 500 MG tablet Commonly known as: VITAMIN C Take 500 mg by mouth daily.   aspirin EC 81 MG tablet Take 81 mg by mouth at bedtime.   atorvastatin 20 MG tablet Commonly known as: LIPITOR Take 1 tablet (20 mg total) by mouth daily.   b complex vitamins capsule Take 1 capsule by mouth daily.   calcium carbonate 1250 (500 Ca) MG tablet Commonly known as: OS-CAL - dosed in mg of elemental calcium Take 1 tablet by mouth daily as needed (Calcium levels).   ciprofloxacin 500 MG tablet Commonly known as: CIPRO Take 1 tablet (500 mg total)  by mouth 2 (two) times daily for 7 days.   ferrous sulfate 325 (65 FE) MG EC tablet Take 1 tablet by mouth 3 (three) times a week.   guaiFENesin 600 MG 12 hr tablet Commonly known as: MUCINEX Take 1 tablet (600 mg total) by mouth 2 (two) times daily.   imatinib 400 MG tablet Commonly known as: GLEEVEC Take 400 mg by mouth daily.   Loperamide HCl 1 MG/7.5ML Liqd Take by mouth as needed.   Magnesium Oxide 400 MG Caps Take 1 capsule (400 mg total) by mouth 2 (two) times daily. What changed: when to take this   [place] Take by mouth See admin instructions. Mix 1 teaspoonful into 8 ounces of water and drink as needed   multivitamin with minerals Tabs tablet Take 1 tablet by mouth daily.   nitroGLYCERIN 0.4 MG SL tablet Commonly known as: NITROSTAT Place 1 tablet (0.4 mg total) under the tongue every 5 (five) minutes x 3 doses as needed for chest pain.   potassium chloride 10 MEQ tablet Commonly known as: KLOR-CON Take 10 mEq by mouth daily.   prochlorperazine 5 MG tablet Commonly known as: COMPAZINE Take 1 tablet (5 mg  total) by mouth every 6 (six) hours as needed for nausea or vomiting.   thyroid 15 MG tablet Commonly known as: ARMOUR Take 15 mg by mouth daily before breakfast.   torsemide 20 MG tablet Commonly known as: DEMADEX Take 1 tablet (20 mg total) by mouth as directed. What changed: when to take this   VITAMIN B12 SL Place 1 tablet under the tongue daily.   VITAMIN D-3 PO Take 5,000 Units by mouth daily.       Discharge Instructions: Please refer to Patient Instructions section of EMR for full details.  Patient was counseled important signs and symptoms that should prompt return to medical care, changes in medications, dietary instructions, activity restrictions, and follow up appointments.   Follow-Up Appointments:  Follow-up Information     Lisa Noon, MD. Schedule an appointment as soon as possible for a visit.   Specialty: Family Medicine Contact information: [place] [address] [phone]                Alen Bleacher, MD 06/19/2022, 4:09 PM PGY-1, [place]

## 2022-06-23 ENCOUNTER — Encounter: Payer: Self-pay | Admitting: Internal Medicine

## 2022-06-24 LAB — CULTURE, BLOOD (ROUTINE X 2)
Culture: NO GROWTH
Culture: NO GROWTH
Special Requests: ADEQUATE
Special Requests: ADEQUATE

## 2022-06-24 NOTE — Telephone Encounter (Signed)
CHe[facility] BMET and BNP in one week

## 2022-06-26 ENCOUNTER — Encounter: Payer: Self-pay | Admitting: Internal Medicine

## 2022-06-26 MED ORDER — POTASSIUM CHLORIDE ER 10 MEQ PO TBCR: 10.0000 meq | EXTENDED_RELEASE_TABLET | Freq: Every day | ORAL | 3 refills | Status: DC

## 2022-06-26 MED ORDER — AMIODARONE HCL 200 MG PO TABS: 200.0000 mg | ORAL_TABLET | Freq: Every day | ORAL | 0 refills | Status: DC

## 2022-06-26 NOTE — Progress Notes (Unsigned)
Electrophysiology Office Note Date: 06/26/2022  ID:  Lisa [name], DOB Sep 15, 1940, MRN [id number]  PCP: Chesley Noon, MD Primary Cardiologist: [name], MD Electrophysiologist: Cristopher [name], MD ***  CC: Routine ICD follow-up  Lisa [name] is a 81 y.o. female seen today for Cristopher [name], MD for {VISITTYPE:28148}    {He/she (caps):30048} has not had ICD shocks.   Device History: {Blank single:19197::"Medtronic","St. Jude","[employer] Scientific","Biotronik"} {Blank single:19197::"Dual Chamber","Single Chamber","BiV","S-ICD"} ICD implanted *** for *** History of appropriate therapy: {yes/no:20286} History of AAD therapy: {Blank single:19197::"No","Yes; currently on ***","Yes; previously tolerated ***"}   Past Medical History:  Diagnosis Date   CAD in native artery 2017   stent to LAD   Cardiac arrest ([place]) 09/2015   CHF (congestive heart failure) (HCC)    CML (chronic myelocytic leukemia) (HCC)    Complication of anesthesia    Hx: UTI (urinary tract infection)    Hypertension    Melanoma ([place])    PONV (postoperative nausea and vomiting)    PVC (premature ventricular contraction)    HISTORY OF   Thyroid disease    V tach ([place])    Past Surgical History:  Procedure Laterality Date   ABDOMINAL HYSTERECTOMY     APPENDECTOMY     CARDIAC CATHETERIZATION N/A 10/03/2015   Procedure: Left Heart Cath and Coronary Angiography;  Surgeon: [name], MD;  Location: [place] [facility];  Service: Cardiovascular;  Laterality: N/A;   CARDIAC CATHETERIZATION N/A 10/07/2015   Procedure: Coronary Stent Intervention;  Surgeon: [name], MD;  Location: [facility] [facility];  Service: Cardiovascular;  Laterality: N/A;   CARDIAC CATHETERIZATION  09/22/2019   EP IMPLANTABLE DEVICE N/A 01/20/2016   Procedure: BiV ICD Insertion CRT-D;  Surgeon: [name], MD;  Location: [place] [facility];  Service: Cardiovascular;  Laterality: N/A;   LEFT HEART CATH AND  CORONARY ANGIOGRAPHY N/A 09/22/2019   Procedure: LEFT HEART CATH AND CORONARY ANGIOGRAPHY;  Surgeon: Martinique, Peter M, MD;  Location: [facility];  Service: Cardiovascular;  Laterality: N/A;   TONSILLECTOMY     TONSILLECTOMY     TUBAL LIGATION      Current Outpatient Medications  Medication Sig Dispense Refill   acetaminophen (TYLENOL) 500 MG tablet Take 500-1,000 mg by mouth every 6 (six) hours as needed for moderate pain.     allopurinol (ZYLOPRIM) 100 MG tablet Take 100 mg by mouth daily.      amiodarone (PACERONE) 200 MG tablet Take 1 tablet (200 mg total) by mouth daily. 30 tablet 0   aspirin EC 81 MG tablet Take 81 mg by mouth at bedtime.      atorvastatin (LIPITOR) 20 MG tablet Take 1 tablet (20 mg total) by mouth daily. 90 tablet 3   b complex vitamins capsule Take 1 capsule by mouth daily.     calcium carbonate (OS-CAL - DOSED IN MG OF ELEMENTAL CALCIUM) 1250 (500 Ca) MG tablet Take 1 tablet by mouth daily as needed (Calcium levels).     Cholecalciferol (VITAMIN D-3 PO) Take 5,000 Units by mouth daily.     ciprofloxacin (CIPRO) 500 MG tablet Take 1 tablet (500 mg total) by mouth 2 (two) times daily for 7 days. 14 tablet 0   Cyanocobalamin (VITAMIN B12 SL) Place 1 tablet under the tongue daily.     ferrous sulfate 325 (65 FE) MG EC tablet Take 1 tablet by mouth 3 (three) times a week.     guaiFENesin (MUCINEX) 600 MG 12  hr tablet Take 1 tablet (600 mg total) by mouth 2 (two) times daily. 30 tablet 0   imatinib (GLEEVEC) 400 MG tablet Take 400 mg by mouth daily.     Inulin (METAMUCIL CLEAR & NATURAL PO) Take by mouth See admin instructions. Mix 1 teaspoonful into 8 ounces of water and drink as needed (Patient not taking: Reported on 06/16/2022)     Loperamide HCl 1 MG/7.5ML LIQD Take by mouth as needed.     Magnesium Oxide 400 MG CAPS Take 1 capsule (400 mg total) by mouth 2 (two) times daily. (Patient taking differently: Take 400 mg by mouth daily.) 180 capsule 1   Multiple  Vitamin (MULTIVITAMIN WITH MINERALS) TABS tablet Take 1 tablet by mouth daily.     nitroGLYCERIN (NITROSTAT) 0.4 MG SL tablet Place 1 tablet (0.4 mg total) under the tongue every 5 (five) minutes x 3 doses as needed for chest pain. 25 tablet 3   potassium chloride (KLOR-CON) 10 MEQ tablet Take 10 mEq by mouth daily.     Probiotic Product (ALIGN PO) Take 1 tablet by mouth daily.     prochlorperazine (COMPAZINE) 5 MG tablet Take 1 tablet (5 mg total) by mouth every 6 (six) hours as needed for nausea or vomiting. 30 tablet 0   thyroid (ARMOUR) 15 MG tablet Take 15 mg by mouth daily before breakfast.      torsemide (DEMADEX) 20 MG tablet Take 1 tablet (20 mg total) by mouth as directed. (Patient taking differently: Take 20 mg by mouth daily.) 90 tablet 3   vitamin C (ASCORBIC ACID) 500 MG tablet Take 500 mg by mouth daily.     No current facility-administered medications for this visit.    Allergies:   Flagyl [metronidazole], Penicillins, Sulfamethoxazole, Meperidine, Morphine and related, Propoxyphene, Tape, Doxycycline, Lanolin, and Sulfa antibiotics   Social History: Social History   Socioeconomic History   Marital status: Divorced    Spouse name: Not on file   Number of children: Not on file   Years of education: Not on file   Highest education level: Not on file  Occupational History   Not on file  Tobacco Use   Smoking status: Never   Smokeless tobacco: Never  Vaping Use   Vaping Use: Never used  Substance and Sexual Activity   Alcohol use: No    Alcohol/week: 0.0 standard drinks of alcohol   Drug use: No   Sexual activity: Not on file  Other Topics Concern   Not on file  Social History Narrative   Not on file   Social Determinants of Health   Financial Resource Strain: Not on file  Food Insecurity: No Food Insecurity (05/22/2022)   Hunger Vital Sign    Worried About Running Out of Food in the Last Year: Never true    Ran Out of Food in the Last Year: Never true   Transportation Needs: No Transportation Needs (05/22/2022)   PRAPARE - Hydrologist (Medical): No    Lack of Transportation (Non-Medical): No  Physical Activity: Not on file  Stress: Not on file  Social Connections: Not on file  Intimate Partner Violence: Not At Risk (05/22/2022)   Humiliation, Afraid, Rape, and Kick questionnaire    Fear of Current or Ex-Partner: No    Emotionally Abused: No    Physically Abused: No    Sexually Abused: No    Family History: Family History  Problem Relation Age of Onset   Coronary artery disease  Mother    Thyroid disease Mother    Multiple myeloma Father    Heart attack Maternal Grandfather    Heart attack Paternal Grandfather     Review of Systems: All other systems reviewed and are otherwise negative except as noted above.   Physical Exam: There were no vitals filed for this visit.   GEN- The patient is well appearing, alert and oriented x 3 today.   HEENT: normocephalic, atraumatic; sclera clear, conjunctiva pink; hearing intact; oropharynx clear; neck supple, no JVP Lymph- no cervical lymphadenopathy Lungs- Clear to ausculation bilaterally, normal work of breathing.  No wheezes, rales, rhonchi Heart- {Blank single:19197::"Regular","Irregularly irregular"}  rate and rhythm, no murmurs, rubs or gallops, PMI not laterally displaced GI- soft, non-tender, non-distended, bowel sounds present, no hepatosplenomegaly Extremities- no clubbing or cyanosis. {EDEMA OMBTD:97416} peripheral edema; DP/PT/radial pulses 2+ bilaterally MS- no significant deformity or atrophy Skin- warm and dry, no rash or lesion; ICD pocket well healed Psych- euthymic mood, full affect Neuro- strength and sensation are intact  ICD interrogation- reviewed in detail today,  See PACEART report  EKG:  EKG {ACTION; IS/IS LAG:53646803} ordered today. Personal review of EKG ordered {Blank single:19197::"today","***"} shows ***  Recent  Labs: 05/21/2022: TSH 1.142 06/16/2022: B Natriuretic Peptide 524.6 06/19/2022: ALT 51; BUN 17; Creatinine, Ser 1.15; Hemoglobin 9.3; Magnesium 1.9; Platelets 228; Potassium 3.6; Sodium 136   Wt Readings from Last 3 Encounters:  06/18/22 216 lb 14.9 oz (98.4 kg)  05/28/22 254 lb 3.1 oz (115.3 kg)  04/29/22 240 lb (108.9 kg)     Other studies Reviewed: Additional studies/ records that were reviewed today include: Previous EP office notes.   {Select studies to display:26339}   Assessment and Plan:  1.  Chronic systolic dysfunction s/p Medtronic CRT-D  euvolemic today Stable on an appropriate medical regimen Normal ICD function See Pace Art report No changes today  2. VT/VF Continue amiodarone *** Keep K > 4.0 and Mg > 2.0, Surveillance labs today  3. Psuedomonas bacteremia Discussed with Dr. Lovena Le, not an absolute indication for extraction.  If pt has recurrent bacteremia or is unable to clear with antibiotics, would at that time need to consider extraction  Current medicines are reviewed at length with the patient today.   =  Labs/ tests ordered today include: *** No orders of the defined types were placed in this encounter.    Disposition:   Follow up with Dr. Lovena Le {Blank single:19197::"in 2 weeks","in 4 weeks","in 3 months","in 6 months","in 12 months","as usual post gen change"}    Signed, Shirley Friar, PA-C  06/26/2022 11:57 AM  [facility] [address] [place] [address] [phone] (office) [phone] (fax)

## 2022-06-26 NOTE — Addendum Note (Signed)
Addended by: Bernestine Amass on: 06/26/2022 12:58 PM   Modules accepted: Orders

## 2022-06-29 ENCOUNTER — Inpatient Hospital Stay: Payer: Medicare Other | Admitting: Internal Medicine

## 2022-06-29 ENCOUNTER — Ambulatory Visit: Payer: Medicare Other | Attending: Student | Admitting: Student

## 2022-06-29 ENCOUNTER — Encounter: Payer: Self-pay | Admitting: Student

## 2022-06-29 VITALS — BP 136/64 | HR 81 | Ht 62.0 in | Wt 205.0 lb

## 2022-06-29 DIAGNOSIS — I4901 Ventricular fibrillation: Secondary | ICD-10-CM | POA: Diagnosis not present

## 2022-06-29 DIAGNOSIS — I5022 Chronic systolic (congestive) heart failure: Secondary | ICD-10-CM | POA: Diagnosis not present

## 2022-06-29 DIAGNOSIS — I472 Ventricular tachycardia, unspecified: Secondary | ICD-10-CM | POA: Diagnosis not present

## 2022-06-29 DIAGNOSIS — I255 Ischemic cardiomyopathy: Secondary | ICD-10-CM

## 2022-06-29 DIAGNOSIS — I10 Essential (primary) hypertension: Secondary | ICD-10-CM | POA: Diagnosis not present

## 2022-06-29 LAB — CUP PACEART INCLINIC DEVICE CHECK
Battery Remaining Longevity: 10 mo
Battery Voltage: 2.9 V
Brady Statistic AP VP Percent: 0.02 %
Brady Statistic AP VS Percent: 0.01 %
Brady Statistic AS VP Percent: 98.41 %
Brady Statistic AS VS Percent: 1.55 %
Brady Statistic RA Percent Paced: 0.03 %
Brady Statistic RV Percent Paced: 4.73 %
Date Time Interrogation Session: 20231120120903
HighPow Impedance: 49 Ohm
Implantable Lead Connection Status: 753985
Implantable Lead Connection Status: 753985
Implantable Lead Connection Status: 753985
Implantable Lead Implant Date: 20170612
Implantable Lead Implant Date: 20170612
Implantable Lead Implant Date: 20170612
Implantable Lead Location: 753858
Implantable Lead Location: 753859
Implantable Lead Location: 753860
Implantable Lead Model: 4398
Implantable Lead Model: 5076
Implantable Pulse Generator Implant Date: 20170612
Lead Channel Impedance Value: 136.276
Lead Channel Impedance Value: 155.257
Lead Channel Impedance Value: 160.216
Lead Channel Impedance Value: 176 Ohm
Lead Channel Impedance Value: 182.4 Ohm
Lead Channel Impedance Value: 247 Ohm
Lead Channel Impedance Value: 285 Ohm
Lead Channel Impedance Value: 304 Ohm
Lead Channel Impedance Value: 361 Ohm
Lead Channel Impedance Value: 418 Ohm
Lead Channel Impedance Value: 418 Ohm
Lead Channel Impedance Value: 456 Ohm
Lead Channel Impedance Value: 475 Ohm
Lead Channel Impedance Value: 589 Ohm
Lead Channel Impedance Value: 608 Ohm
Lead Channel Impedance Value: 608 Ohm
Lead Channel Impedance Value: 665 Ohm
Lead Channel Impedance Value: 665 Ohm
Lead Channel Pacing Threshold Amplitude: 1 V
Lead Channel Pacing Threshold Amplitude: 1.125 V
Lead Channel Pacing Threshold Amplitude: 1.125 V
Lead Channel Pacing Threshold Pulse Width: 0.4 ms
Lead Channel Pacing Threshold Pulse Width: 0.4 ms
Lead Channel Pacing Threshold Pulse Width: 0.8 ms
Lead Channel Sensing Intrinsic Amplitude: 3.125 mV
Lead Channel Sensing Intrinsic Amplitude: 3.75 mV
Lead Channel Sensing Intrinsic Amplitude: 4.25 mV
Lead Channel Sensing Intrinsic Amplitude: 8.75 mV
Lead Channel Setting Pacing Amplitude: 1.75 V
Lead Channel Setting Pacing Amplitude: 2 V
Lead Channel Setting Pacing Amplitude: 2.75 V
Lead Channel Setting Pacing Pulse Width: 0.4 ms
Lead Channel Setting Pacing Pulse Width: 0.8 ms
Lead Channel Setting Sensing Sensitivity: 0.3 mV
Zone Setting Status: 755011
Zone Setting Status: 755011

## 2022-06-29 MED ORDER — METOPROLOL SUCCINATE ER 25 MG PO TB24: 12.5000 mg | ORAL_TABLET | Freq: Every day | ORAL | 3 refills | Status: DC

## 2022-06-29 NOTE — Patient Instructions (Signed)
Medication Instructions:  Your physician has recommended you make the following change in your medication:   START: Metoprolol Succinate 12.'5mg'$  daily  *If you need a refill on your cardiac medications before your next appointment, please call your pharmacy*   Lab Work: None If you have labs (blood work) drawn today and your tests are completely normal, you will receive your results only by: [facility] (if you have MyChart) OR A paper copy in the mail If you have any lab test that is abnormal or we need to change your treatment, we will call you to review the results.   Follow-Up: At [facility], you and your health needs are our priority.  As part of our continuing mission to provide you with exceptional heart care, we have created designated Provider Care Teams.  These Care Teams include your primary Cardiologist (physician) and Advanced Practice Providers (APPs -  Physician Assistants and Nurse Practitioners) who all work together to provide you with the care you need, when you need it.  Your next appointment:   As scheduled  Other Instructions Call to reschedule your appointment with the Infectious Disease Clinic  Important Information About Sugar

## 2022-07-13 ENCOUNTER — Ambulatory Visit (INDEPENDENT_AMBULATORY_CARE_PROVIDER_SITE_OTHER): Payer: Medicare Other

## 2022-07-13 DIAGNOSIS — I5022 Chronic systolic (congestive) heart failure: Secondary | ICD-10-CM

## 2022-07-13 DIAGNOSIS — Z95810 Presence of automatic (implantable) cardiac defibrillator: Secondary | ICD-10-CM | POA: Diagnosis not present

## 2022-07-15 ENCOUNTER — Telehealth: Payer: Self-pay

## 2022-07-15 NOTE — Progress Notes (Signed)
EPIC Encounter for ICM Monitoring  Patient Name: Lisa [name] is a 81 y.o. female Date: 07/15/2022 Primary Care Physican: Chesley Noon, MD Primary Cardiologist: Harrington Challenger Electrophysiologist: Santina Evans Pacing: 98.2%         11/12/2021 Weight: 248 lbs 01/21/2022 Weight: 246 lbs   Time in AT/AF  0.0 hr/day (0.0%) Not on anticoagulation                                                            Attempted call to patient and unable to reach.  Left detailed message per DPR regarding transmission. Transmission reviewed.    Optivol thoracic impedance suggesting possible fluid accumulation starting 11/27 and trending back to baseline.   Prescribed:  Torsemide 20 mg take 1 tablet by mouth as directed.  Potassium 10 mEq Take 1 tablet by mouth daily.   Labs: 06/19/2022 Creatinine 1.15, BUN 17, Potassium 3.6, Sodium 136, GFR 48 06/18/2022 Creatinine 1.30, BUN 21, Potassium 3.3, Sodium 131, GFR 42  06/17/2022 Creatinine 1.62, BUN 25, Potassium 3.5, Sodium 136, GFR 32  06/16/2022 Creatinine 2.04, BUN 25, Potassium 4.1, Sodium 139, GFR 24  05/28/2022 Creatinine 1.22, BUN 14, Potassium 3.3, Sodium 134, GFR 45  05/28/2023 Creatinine 1.24, BUN 12, Potassium 3.5, Sodium 135, GFR 44  05/27/2023 Creatinine 1.37, BUN 12, Potassium 3.9, Sodium 135, GFR 39  05/26/2023 Creatinine 1.30, BUN 15, Potassium 3.5, Sodium 137, GFR 42  A complete set of results can be found in Results Review.   Recommendations:  Left voice mail with ICM number and encouraged to call if experiencing any fluid symptoms.   Follow-up plan: ICM clinic phone appointment on 07/20/2022 to recheck fluid levels.   91 day device clinic remote transmission 07/29/2022.     EP/Cardiology Office Visits:  Recall 12/25/2022 with Dr. Harrington Challenger.  09/01/2022 with Dr Lovena Le.   Copy of ICM check sent to Dr. Lovena Le.  Will send to Dr Harrington Challenger for review if patient is reached.   3 month ICM trend: 07/14/2022.    12-14 Month ICM trend:     [name], RN 07/15/2022 8:09 AM

## 2022-07-15 NOTE — Telephone Encounter (Signed)
Remote ICM transmission received.  Attempted call to patient regarding ICM remote transmission and left detailed message per DPR.  Advised to return call for any fluid symptoms or questions. Next ICM remote transmission scheduled 07/21/2023.

## 2022-07-20 ENCOUNTER — Ambulatory Visit (INDEPENDENT_AMBULATORY_CARE_PROVIDER_SITE_OTHER): Payer: Medicare Other

## 2022-07-20 DIAGNOSIS — I5022 Chronic systolic (congestive) heart failure: Secondary | ICD-10-CM

## 2022-07-20 DIAGNOSIS — Z95810 Presence of automatic (implantable) cardiac defibrillator: Secondary | ICD-10-CM

## 2022-07-20 NOTE — Progress Notes (Signed)
EPIC Encounter for ICM Monitoring  Patient Name: Lisa [name] is a 81 y.o. female Date: 07/20/2022 Primary Care Physican: Chesley Noon, MD Primary Cardiologist: Harrington Challenger Electrophysiologist: Santina Evans Pacing: 98.2%         11/12/2021 Weight: 248 lbs 01/21/2022 Weight: 246 lbs   Time in AT/AF  0.0 hr/day (0.0%) Not on anticoagulation                                                            Spoke with patient and heart failure questions reviewed.  Transmission results reviewed.  Pt reports cold and chest congestion for past 2 weeks but starting to feel better.   Optivol thoracic impedance suggesting possible fluid accumulation continues since 11/27 and trending closer to baseline.   Prescribed:  Torsemide 20 mg take 1 tablet by mouth as directed.  Potassium 10 mEq Take 1 tablet by mouth daily.   Labs: 06/19/2022 Creatinine 1.15, BUN 17, Potassium 3.6, Sodium 136, GFR 48 06/18/2022 Creatinine 1.30, BUN 21, Potassium 3.3, Sodium 131, GFR 42  06/17/2022 Creatinine 1.62, BUN 25, Potassium 3.5, Sodium 136, GFR 32  06/16/2022 Creatinine 2.04, BUN 25, Potassium 4.1, Sodium 139, GFR 24  05/28/2022 Creatinine 1.22, BUN 14, Potassium 3.3, Sodium 134, GFR 45  05/28/2023 Creatinine 1.24, BUN 12, Potassium 3.5, Sodium 135, GFR 44  05/27/2023 Creatinine 1.37, BUN 12, Potassium 3.9, Sodium 135, GFR 39  05/26/2023 Creatinine 1.30, BUN 15, Potassium 3.5, Sodium 137, GFR 42  A complete set of results can be found in Results Review.   Recommendations:  No changes and encouraged to call if experiencing any fluid symptoms.   Follow-up plan: ICM clinic phone appointment on 07/29/2022 to recheck fluid levels.   91 day device clinic remote transmission 07/29/2022.     EP/Cardiology Office Visits:  Recall 12/25/2022 with Dr. Harrington Challenger.  09/01/2022 with Dr Lovena Le.   Copy of ICM check sent to Dr. Lovena Le.     3 month ICM trend: 07/20/2022.    12-14 Month ICM trend:     [name],  RN 07/20/2022 9:13 AM

## 2022-07-28 ENCOUNTER — Other Ambulatory Visit: Payer: Self-pay | Admitting: Internal Medicine

## 2022-07-29 ENCOUNTER — Ambulatory Visit (INDEPENDENT_AMBULATORY_CARE_PROVIDER_SITE_OTHER): Payer: Medicare Other

## 2022-07-29 DIAGNOSIS — I255 Ischemic cardiomyopathy: Secondary | ICD-10-CM

## 2022-07-30 NOTE — Progress Notes (Signed)
No ICM remote transmission received for 07/27/2022 and next ICM transmission scheduled for 08/31/2022.   

## 2022-08-04 LAB — CUP PACEART REMOTE DEVICE CHECK
Battery Remaining Longevity: 10 mo
Battery Voltage: 2.88 V
Brady Statistic AP VP Percent: 0.1 %
Brady Statistic AP VS Percent: 0.02 %
Brady Statistic AS VP Percent: 98.26 %
Brady Statistic AS VS Percent: 1.62 %
Brady Statistic RA Percent Paced: 0.12 %
Brady Statistic RV Percent Paced: 1.39 %
Date Time Interrogation Session: 20231225001703
HighPow Impedance: 45 Ohm
Implantable Lead Connection Status: 753985
Implantable Lead Connection Status: 753985
Implantable Lead Connection Status: 753985
Implantable Lead Implant Date: 20170612
Implantable Lead Implant Date: 20170612
Implantable Lead Implant Date: 20170612
Implantable Lead Location: 753858
Implantable Lead Location: 753859
Implantable Lead Location: 753860
Implantable Lead Model: 4398
Implantable Lead Model: 5076
Implantable Pulse Generator Implant Date: 20170612
Lead Channel Impedance Value: 142.5 Ohm
Lead Channel Impedance Value: 175.385
Lead Channel Impedance Value: 175.385
Lead Channel Impedance Value: 183.214
Lead Channel Impedance Value: 183.214
Lead Channel Impedance Value: 228 Ohm
Lead Channel Impedance Value: 285 Ohm
Lead Channel Impedance Value: 285 Ohm
Lead Channel Impedance Value: 304 Ohm
Lead Channel Impedance Value: 418 Ohm
Lead Channel Impedance Value: 456 Ohm
Lead Channel Impedance Value: 475 Ohm
Lead Channel Impedance Value: 513 Ohm
Lead Channel Impedance Value: 608 Ohm
Lead Channel Impedance Value: 646 Ohm
Lead Channel Impedance Value: 665 Ohm
Lead Channel Impedance Value: 665 Ohm
Lead Channel Impedance Value: 703 Ohm
Lead Channel Pacing Threshold Amplitude: 1 V
Lead Channel Pacing Threshold Amplitude: 1.125 V
Lead Channel Pacing Threshold Amplitude: 1.25 V
Lead Channel Pacing Threshold Pulse Width: 0.4 ms
Lead Channel Pacing Threshold Pulse Width: 0.4 ms
Lead Channel Pacing Threshold Pulse Width: 0.8 ms
Lead Channel Sensing Intrinsic Amplitude: 2.5 mV
Lead Channel Sensing Intrinsic Amplitude: 2.5 mV
Lead Channel Sensing Intrinsic Amplitude: 4 mV
Lead Channel Sensing Intrinsic Amplitude: 4 mV
Lead Channel Setting Pacing Amplitude: 1.75 V
Lead Channel Setting Pacing Amplitude: 2 V
Lead Channel Setting Pacing Amplitude: 2.5 V
Lead Channel Setting Pacing Pulse Width: 0.4 ms
Lead Channel Setting Pacing Pulse Width: 0.8 ms
Lead Channel Setting Sensing Sensitivity: 0.3 mV
Zone Setting Status: 755011
Zone Setting Status: 755011

## 2022-08-13 ENCOUNTER — Other Ambulatory Visit (HOSPITAL_COMMUNITY): Payer: Self-pay

## 2022-08-24 NOTE — Progress Notes (Signed)
Remote ICD transmission.   

## 2022-08-31 ENCOUNTER — Ambulatory Visit: Payer: Medicare Other | Attending: Internal Medicine

## 2022-08-31 DIAGNOSIS — I5022 Chronic systolic (congestive) heart failure: Secondary | ICD-10-CM

## 2022-08-31 DIAGNOSIS — Z95810 Presence of automatic (implantable) cardiac defibrillator: Secondary | ICD-10-CM | POA: Diagnosis not present

## 2022-09-01 ENCOUNTER — Encounter: Payer: Self-pay | Admitting: Internal Medicine

## 2022-09-01 ENCOUNTER — Ambulatory Visit: Payer: Medicare Other | Attending: Internal Medicine | Admitting: Internal Medicine

## 2022-09-01 VITALS — BP 128/60 | HR 71 | Ht 62.0 in | Wt 205.0 lb

## 2022-09-01 DIAGNOSIS — Z95810 Presence of automatic (implantable) cardiac defibrillator: Secondary | ICD-10-CM | POA: Diagnosis not present

## 2022-09-01 DIAGNOSIS — I4901 Ventricular fibrillation: Secondary | ICD-10-CM | POA: Insufficient documentation

## 2022-09-01 DIAGNOSIS — I5022 Chronic systolic (congestive) heart failure: Secondary | ICD-10-CM | POA: Insufficient documentation

## 2022-09-01 NOTE — Progress Notes (Signed)
HPI [name] returns today for followup. She is a pleasant morbidly obese 82 yo woman with chronic systolic heart failure, PAF, and VT. She is s/p Biv ICD insertion. She remains on amiodarone 200 mg daily. She is fairly sedentary. No additional syncope or ICD therapies. 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 She has gotten a new leg wrap which appears to be helping some.  Allergies  Allergen Reactions   Flagyl [Metronidazole] Itching, Rash and Other (See Comments)    Welts, also   Penicillins Hives, Itching and Rash    Has patient had a PCN reaction causing immediate rash, facial/tongue/throat swelling, SOB or lightheadedness with hypotension: Yes  Has patient had a PCN reaction causing severe rash involving mucus membranes or skin necrosis: Yes Has patient had a PCN reaction that required hospitalization No Has patient had a PCN reaction occurring within the last 10 years: NO If all of the above answers are "NO", then may proceed with Cephalosporin use.    Sulfamethoxazole Rash   Meperidine Nausea And Vomiting   Morphine And Related Nausea And Vomiting   Propoxyphene     Other reaction(s): Unknown   Tape Other (See Comments)    "THE PLASTIC, CLEAR TAPE CAN/DOES PULL OFF MY SKIN."   Doxycycline Nausea Only, Rash and Other (See Comments)    Made her "feel terrible"   Lanolin Itching and Rash   Sulfa Antibiotics Rash     Current Outpatient Medications  Medication Sig Dispense Refill   acetaminophen (TYLENOL) 500 MG tablet Take 500-1,000 mg by mouth every 6 (six) hours as needed for moderate pain.     allopurinol (ZYLOPRIM) 100 MG tablet Take 100 mg by mouth daily.      amiodarone (PACERONE) 200 MG tablet TAKE ONE TABLET BY MOUTH ONE TIME DAILY 90 tablet 3   aspirin EC 81 MG tablet Take 81 mg by mouth at bedtime.      atorvastatin (LIPITOR) 20 MG tablet Take 1 tablet (20 mg total) by mouth daily. 90 tablet 3   b complex vitamins capsule Take 1 capsule  by mouth daily.     calcium carbonate (OS-CAL - DOSED IN MG OF ELEMENTAL CALCIUM) 1250 (500 Ca) MG tablet Take 1 tablet by mouth daily as needed (Calcium levels).     Cholecalciferol (VITAMIN D-3 PO) Take 5,000 Units by mouth daily.     Cyanocobalamin (VITAMIN B12 SL) Place 1 tablet under the tongue daily.     ferrous sulfate 325 (65 FE) MG EC tablet Take 1 tablet by mouth 3 (three) times a week.     guaiFENesin (MUCINEX) 600 MG 12 hr tablet Take 1 tablet (600 mg total) by mouth 2 (two) times daily. 30 tablet 0   imatinib (GLEEVEC) 400 MG tablet Take 400 mg by mouth daily.     Inulin (METAMUCIL CLEAR & NATURAL PO) Take by mouth See admin instructions. Mix 1 teaspoonful into 8 ounces of water and drink as needed     Loperamide HCl 1 MG/7.5ML LIQD Take by mouth as needed.     Magnesium Oxide 400 MG CAPS Take 1 capsule (400 mg total) by mouth 2 (two) times daily. (Patient taking differently: Take 400 mg by mouth daily.) 180 capsule 1   metoprolol succinate (TOPROL XL) 25 MG 24 hr tablet Take 0.5 tablets (12.5 mg total) by mouth daily. 45 tablet 3   Multiple Vitamin (MULTIVITAMIN WITH MINERALS) TABS tablet Take 1 tablet by mouth daily.  nitroGLYCERIN (NITROSTAT) 0.4 MG SL tablet Place 1 tablet (0.4 mg total) under the tongue every 5 (five) minutes x 3 doses as needed for chest pain. 25 tablet 3   potassium chloride (KLOR-CON) 10 MEQ tablet Take 1 tablet (10 mEq total) by mouth daily. 90 tablet 3   Probiotic Product (ALIGN PO) Take 1 tablet by mouth daily.     prochlorperazine (COMPAZINE) 5 MG tablet Take 1 tablet (5 mg total) by mouth every 6 (six) hours as needed for nausea or vomiting. 30 tablet 0   thyroid (ARMOUR) 15 MG tablet Take 15 mg by mouth daily before breakfast.      torsemide (DEMADEX) 20 MG tablet Take 1 tablet (20 mg total) by mouth as directed. (Patient taking differently: Take 20 mg by mouth daily.) 90 tablet 3   vitamin C (ASCORBIC ACID) 500 MG tablet Take 500 mg by mouth daily.      No current facility-administered medications for this visit.     Past Medical History:  Diagnosis Date   CAD in native artery 2017   stent to LAD   Cardiac arrest ([place]) 09/2015   CHF (congestive heart failure) (HCC)    CML (chronic myelocytic leukemia) (HCC)    Complication of anesthesia    Hx: UTI (urinary tract infection)    Hypertension    Melanoma (HCC)    PONV (postoperative nausea and vomiting)    PVC (premature ventricular contraction)    HISTORY OF   Thyroid disease    V tach ([place])     ROS:   All systems reviewed and negative except as noted in the HPI.   Past Surgical History:  Procedure Laterality Date   ABDOMINAL HYSTERECTOMY     APPENDECTOMY     CARDIAC CATHETERIZATION N/A 10/03/2015   Procedure: Left Heart Cath and Coronary Angiography;  Surgeon: [name], MD;  Location: [place] [facility];  Service: Cardiovascular;  Laterality: N/A;   CARDIAC CATHETERIZATION N/A 10/07/2015   Procedure: Coronary Stent Intervention;  Surgeon: [name], MD;  Location: [place] [facility];  Service: Cardiovascular;  Laterality: N/A;   CARDIAC CATHETERIZATION  09/22/2019   EP IMPLANTABLE DEVICE N/A 01/20/2016   Procedure: BiV ICD Insertion CRT-D;  Surgeon: [name], MD;  Location: [place] [facility];  Service: Cardiovascular;  Laterality: N/A;   LEFT HEART CATH AND CORONARY ANGIOGRAPHY N/A 09/22/2019   Procedure: LEFT HEART CATH AND CORONARY ANGIOGRAPHY;  Surgeon: Martinique, Peter M, MD;  Location: [place] [facility];  Service: Cardiovascular;  Laterality: N/A;   TONSILLECTOMY     TONSILLECTOMY     TUBAL LIGATION       Family History  Problem Relation Age of Onset   Coronary artery disease Mother    Thyroid disease Mother    Multiple myeloma Father    Heart attack Maternal Grandfather    Heart attack Paternal Grandfather      Social History   Socioeconomic History   Marital status: Divorced    Spouse name: Not on file   Number of children: Not  on file   Years of education: Not on file   Highest education level: Not on file  Occupational History   Not on file  Tobacco Use   Smoking status: Never   Smokeless tobacco: Never  Vaping Use   Vaping Use: Never used  Substance and Sexual Activity   Alcohol use: No    Alcohol/week: 0.0 standard drinks of alcohol   Drug use:  No   Sexual activity: Not on file  Other Topics Concern   Not on file  Social History Narrative   Not on file   Social Determinants of Health   Financial Resource Strain: Not on file  Food Insecurity: No Food Insecurity (05/22/2022)   Hunger Vital Sign    Worried About Running Out of Food in the Last Year: Never true    Ran Out of Food in the Last Year: Never true  Transportation Needs: No Transportation Needs (05/22/2022)   PRAPARE - Hydrologist (Medical): No    Lack of Transportation (Non-Medical): No  Physical Activity: Not on file  Stress: Not on file  Social Connections: Not on file  Intimate Partner Violence: Not At Risk (05/22/2022)   Humiliation, Afraid, Rape, and Kick questionnaire    Fear of Current or Ex-Partner: No    Emotionally Abused: No    Physically Abused: No    Sexually Abused: No     BP 128/60   Pulse 71   Ht '5\' 2"'$  (1.575 m)   Wt 205 lb (93 kg)   LMP 02/14/1995   SpO2 99%   BMI 37.49 kg/m   Physical Exam:  Well appearing NAD HEENT: Unremarkable Neck:  No JVD, no thyromegally Lymphatics:  No adenopathy Back:  No CVA tenderness Lungs:  Clear HEART:  Regular rate rhythm, no murmurs, no rubs, no clicks Abd:  soft, positive bowel sounds, no organomegally, no rebound, no guarding Ext:  2 plus pulses, no edema, no cyanosis, no clubbing Skin:  No rashes no nodules Neuro:  CN II through XII intact, motor grossly intact  EKG - nsr with biv pacing  DEVICE  Normal device function.  See PaceArt for details.   Assess/Plan:  VF - she has had a single episode which was terminated with ICD  shock. Continue amiodarone. PAF - she has not had any. Continue curren meds. Chronic systolic heart failure - her symptoms remain class 2. We discussed elevation of the legs. Her dose of diuretics is limited by CRI. ICD - her medtronic biv ICD is working normally. No change.   Carleene Overlie Lisa Vogan,MD

## 2022-09-01 NOTE — Patient Instructions (Signed)
Medication Instructions:  Your physician recommends that you continue on your current medications as directed. Please refer to the Current Medication list given to you today.  *If you need a refill on your cardiac medications before your next appointment, please call your pharmacy*  Lab Work: None ordered.  If you have labs (blood work) drawn today and your tests are completely normal, you will receive your results only by: [place] (if you have MyChart) OR A paper copy in the mail If you have any lab test that is abnormal or we need to change your treatment, we will call you to review the results.  Testing/Procedures: None ordered.  Follow-Up: At [facility], you and your health needs are our priority.  As part of our continuing mission to provide you with exceptional heart care, we have created designated Provider Care Teams.  These Care Teams include your primary Cardiologist (physician) and Advanced Practice Providers (APPs -  Physician Assistants and Nurse Practitioners) who all work together to provide you with the care you need, when you need it.  We recommend signing up for the patient portal called "MyChart".  Sign up information is provided on this After Visit Summary.  MyChart is used to connect with patients for Virtual Visits (Telemedicine).  Patients are able to view lab/test results, encounter notes, upcoming appointments, etc.  Non-urgent messages can be sent to your provider as well.   To learn more about what you can do with MyChart, go to [email or web address].    Your next appointment:   1 year(s)  The format for your next appointment:   In Person  Provider:   Cristopher [name], MD{or one of the following Advanced Practice Providers on your designated Care Team:   Tommye Standard, [place] Legrand Como "Jonni Sanger" Chalmers Cater, [place]  Remote monitoring is used to monitor your ICD from home. This monitoring reduces the number of office visits required to check your device to one  time per year. It allows [place] to keep an eye on the functioning of your device to ensure it is working properly. You are scheduled for a device check from home on 10/28/2022. You may send your transmission at any time that day. If you have a wireless device, the transmission will be sent automatically. After your physician reviews your transmission, you will receive a postcard with your next transmission date.  Important Information About [place]

## 2022-09-04 NOTE — Progress Notes (Signed)
EPIC Encounter for ICM Monitoring  Patient Name: Lisa [name] is a 82 y.o. female Date: 09/04/2022 Primary Care Physican: Chesley Noon, MD Primary Cardiologist: Harrington Challenger Electrophysiologist: Santina Evans Pacing: 98.3%         11/12/2021 Weight: 248 lbs 01/21/2022 Weight: 246 lbs 09/01/2022 Office Weight: 205 lbs   Time in AT/AF  0.0 hr/day (0.0%) Not on anticoagulation                                                            Transmission reviewed.     Optivol thoracic impedance suggesting possible fluid accumulation continues since 11/27 and returned to baseline 1/21.   Prescribed:  Torsemide 20 mg take 1 tablet by mouth as directed.  Potassium 10 mEq Take 1 tablet by mouth daily.   Labs: 06/26/2022 Creatinine 1.58, BUN 18, Potassium 4.3, Sodium 135, GFR 33 06/19/2022 Creatinine 1.15, BUN 17, Potassium 3.6, Sodium 136, GFR 48 06/18/2022 Creatinine 1.30, BUN 21, Potassium 3.3, Sodium 131, GFR 42  06/17/2022 Creatinine 1.62, BUN 25, Potassium 3.5, Sodium 136, GFR 32  06/16/2022 Creatinine 2.04, BUN 25, Potassium 4.1, Sodium 139, GFR 24  A complete set of results can be found in Results Review.   Recommendations:  Recommendations given at 1/23 OV with Dr Lovena Le if needed.    Follow-up plan: ICM clinic phone appointment on 10/05/2022.   91 day device clinic remote transmission 10/28/2022.     EP/Cardiology Office Visits:  Recall 12/25/2022 with Dr. Harrington Challenger.  Next EP yearly visit 1//2025 with Dr Lovena Le but no recall.   Copy of ICM check sent to Dr. Lovena Le.     3 month ICM trend: 08/31/2022.    12-14 Month ICM trend:     [name], RN 09/04/2022 10:43 AM

## 2022-09-14 ENCOUNTER — Encounter: Payer: Self-pay | Admitting: Internal Medicine

## 2022-10-05 ENCOUNTER — Ambulatory Visit: Payer: Medicare Other | Attending: Internal Medicine

## 2022-10-05 DIAGNOSIS — Z95810 Presence of automatic (implantable) cardiac defibrillator: Secondary | ICD-10-CM | POA: Diagnosis not present

## 2022-10-05 DIAGNOSIS — I5022 Chronic systolic (congestive) heart failure: Secondary | ICD-10-CM | POA: Diagnosis not present

## 2022-10-09 ENCOUNTER — Telehealth: Payer: Self-pay

## 2022-10-09 NOTE — Telephone Encounter (Signed)
Remote ICM transmission received.  Attempted call to patient regarding ICM remote transmission and left detailed message per DPR.  Advised to return call for any fluid symptoms or questions. Next ICM remote transmission scheduled 11/09/2022.

## 2022-10-09 NOTE — Progress Notes (Signed)
EPIC Encounter for ICM Monitoring  Patient Name: Lisa [name] is a 82 y.o. female Date: 10/09/2022 Primary Care Physican: Chesley Noon, MD Primary Cardiologist: Harrington Challenger Electrophysiologist: Santina Evans Pacing: 98.3%         11/12/2021 Weight: 248 lbs 01/21/2022 Weight: 246 lbs 09/01/2022 Office Weight: 205 lbs   Time in AT/AF  0.0 hr/day (0.0%) Not on anticoagulation                                                            Attempted call to patient and unable to reach.  Left detailed message per DPR regarding transmission. Transmission reviewed.    Optivol thoracic impedance suggesting normal fluid level.     Prescribed:  Torsemide 20 mg take 1 tablet by mouth as directed.  Potassium 10 mEq Take 1 tablet by mouth daily.   Labs: 06/26/2022 Creatinine 1.58, BUN 18, Potassium 4.3, Sodium 135, GFR 33 06/19/2022 Creatinine 1.15, BUN 17, Potassium 3.6, Sodium 136, GFR 48 06/18/2022 Creatinine 1.30, BUN 21, Potassium 3.3, Sodium 131, GFR 42  06/17/2022 Creatinine 1.62, BUN 25, Potassium 3.5, Sodium 136, GFR 32  06/16/2022 Creatinine 2.04, BUN 25, Potassium 4.1, Sodium 139, GFR 24  A complete set of results can be found in Results Review.   Recommendations:  Left voice mail with ICM number and encouraged to call if experiencing any fluid symptoms.   Follow-up plan: ICM clinic phone appointment on 11/09/2022.   91 day device clinic remote transmission 10/28/2022.     EP/Cardiology Office Visits:  Recall 12/25/2022 with Dr. Harrington Challenger.  Next EP yearly visit 08/2023 with Dr Lovena Le but no recall.   Copy of ICM check sent to Dr. Lovena Le.    3 month ICM trend: 10/05/2022.    12-14 Month ICM trend:     [name], RN 10/09/2022 8:59 AM

## 2022-10-28 ENCOUNTER — Ambulatory Visit (INDEPENDENT_AMBULATORY_CARE_PROVIDER_SITE_OTHER): Payer: Medicare Other

## 2022-10-28 ENCOUNTER — Telehealth: Payer: Self-pay | Admitting: Internal Medicine

## 2022-10-28 DIAGNOSIS — I255 Ischemic cardiomyopathy: Secondary | ICD-10-CM

## 2022-10-28 NOTE — Telephone Encounter (Signed)
Paper Work Dropped Off:  dmv pw release of information   Date: 10/28/2022  Location of paper: providers box   Has requested forms be faxed and mailed, return envelope with stamp provided

## 2022-10-29 LAB — CUP PACEART REMOTE DEVICE CHECK
Battery Remaining Longevity: 15 mo
Battery Voltage: 2.88 V
Brady Statistic AP VP Percent: 0.22 %
Brady Statistic AP VS Percent: 0.02 %
Brady Statistic AS VP Percent: 98.19 %
Brady Statistic AS VS Percent: 1.57 %
Brady Statistic RA Percent Paced: 0.24 %
Brady Statistic RV Percent Paced: 0.26 %
Date Time Interrogation Session: 20240320012402
HighPow Impedance: 43 Ohm
Implantable Lead Connection Status: 753985
Implantable Lead Connection Status: 753985
Implantable Lead Connection Status: 753985
Implantable Lead Implant Date: 20170612
Implantable Lead Implant Date: 20170612
Implantable Lead Implant Date: 20170612
Implantable Lead Location: 753858
Implantable Lead Location: 753859
Implantable Lead Location: 753860
Implantable Lead Model: 4398
Implantable Lead Model: 5076
Implantable Pulse Generator Implant Date: 20170612
Lead Channel Impedance Value: 123.5 Ohm
Lead Channel Impedance Value: 155.257
Lead Channel Impedance Value: 155.257
Lead Channel Impedance Value: 160.216
Lead Channel Impedance Value: 160.216
Lead Channel Impedance Value: 247 Ohm
Lead Channel Impedance Value: 247 Ohm
Lead Channel Impedance Value: 247 Ohm
Lead Channel Impedance Value: 304 Ohm
Lead Channel Impedance Value: 418 Ohm
Lead Channel Impedance Value: 418 Ohm
Lead Channel Impedance Value: 456 Ohm
Lead Channel Impedance Value: 456 Ohm
Lead Channel Impedance Value: 589 Ohm
Lead Channel Impedance Value: 589 Ohm
Lead Channel Impedance Value: 608 Ohm
Lead Channel Impedance Value: 646 Ohm
Lead Channel Impedance Value: 646 Ohm
Lead Channel Pacing Threshold Amplitude: 1 V
Lead Channel Pacing Threshold Amplitude: 1.125 V
Lead Channel Pacing Threshold Amplitude: 1.125 V
Lead Channel Pacing Threshold Pulse Width: 0.4 ms
Lead Channel Pacing Threshold Pulse Width: 0.4 ms
Lead Channel Pacing Threshold Pulse Width: 0.8 ms
Lead Channel Sensing Intrinsic Amplitude: 2.5 mV
Lead Channel Sensing Intrinsic Amplitude: 2.5 mV
Lead Channel Sensing Intrinsic Amplitude: 3.375 mV
Lead Channel Sensing Intrinsic Amplitude: 3.375 mV
Lead Channel Setting Pacing Amplitude: 1.75 V
Lead Channel Setting Pacing Amplitude: 2 V
Lead Channel Setting Pacing Amplitude: 2.5 V
Lead Channel Setting Pacing Pulse Width: 0.4 ms
Lead Channel Setting Pacing Pulse Width: 0.8 ms
Lead Channel Setting Sensing Sensitivity: 0.3 mV
Zone Setting Status: 755011
Zone Setting Status: 755011

## 2022-11-03 NOTE — Telephone Encounter (Signed)
Forms received, reviewed / assessed by [employer].  Response to [facility] request pending review from leadership.

## 2022-11-09 ENCOUNTER — Telehealth: Payer: Self-pay

## 2022-11-09 ENCOUNTER — Ambulatory Visit: Payer: Medicare Other | Attending: Internal Medicine

## 2022-11-09 DIAGNOSIS — Z95810 Presence of automatic (implantable) cardiac defibrillator: Secondary | ICD-10-CM | POA: Diagnosis not present

## 2022-11-09 DIAGNOSIS — I5022 Chronic systolic (congestive) heart failure: Secondary | ICD-10-CM

## 2022-11-09 NOTE — Progress Notes (Signed)
EPIC Encounter for ICM Monitoring  Patient Name: Lisa [name] is a 82 y.o. female Date: 11/09/2022 Primary Care Physican: Chesley Noon, MD Primary Cardiologist: Harrington Challenger Electrophysiologist: Santina Evans Pacing: 98.3%         11/12/2021 Weight: 248 lbs 01/21/2022 Weight: 246 lbs 09/01/2022 Office Weight: 205 lbs   Time in AT/AF  0.0 hr/day (0.0%) Not on anticoagulation                                                            Attempted call to patient and unable to reach.  Left detailed message per DPR regarding transmission. Transmission reviewed.    Optivol thoracic impedance trending close to baseline but does suggest possible fluid over the last 2 weeks.     Prescribed:  Torsemide 20 mg take 1 tablet by mouth as directed.  Potassium 10 mEq Take 1 tablet by mouth daily.   Labs: 10/09/2022 Creatinine 1.54, BUN 21, Potassium 4.4, Sodium 140, GFR 31 06/26/2022 Creatinine 1.58, BUN 18, Potassium 4.3, Sodium 135, GFR 33 06/19/2022 Creatinine 1.15, BUN 17, Potassium 3.6, Sodium 136, GFR 48 06/18/2022 Creatinine 1.30, BUN 21, Potassium 3.3, Sodium 131, GFR 42  06/17/2022 Creatinine 1.62, BUN 25, Potassium 3.5, Sodium 136, GFR 32  06/16/2022 Creatinine 2.04, BUN 25, Potassium 4.1, Sodium 139, GFR 24  A complete set of results can be found in Results Review.   Recommendations:  Left voice mail with ICM number and encouraged to call if experiencing any fluid symptoms.   Follow-up plan: ICM clinic phone appointment on 12/14/2022.   91 day device clinic remote transmission 01/27/2023.     EP/Cardiology Office Visits:  Recall 12/25/2022 with Dr. Harrington Challenger.  07/23/2023 with EP APP.   Copy of ICM check sent to Dr. Lovena Le.    3 month ICM trend: 11/09/2022.    12-14 Month ICM trend:     [name], RN 11/09/2022 1:48 PM

## 2022-11-09 NOTE — Telephone Encounter (Signed)
Remote ICM transmission received.  Attempted call to patient regarding ICM remote transmission and left detailed message per DPR.  Advised to return call for any fluid symptoms or questions. Next ICM remote transmission scheduled 12/14/2022.

## 2022-11-19 ENCOUNTER — Telehealth: Payer: Self-pay

## 2022-11-19 NOTE — Telephone Encounter (Signed)
The patient called frustrated because she dropped off some paperwork to be filled out for the dmv. I let her know that the paperwork has been turned in to our management team for further review. She would like a call back soon. I told her our office manager is not in the office this week but will be able to contact her next week. The patient verbalized understanding.

## 2022-11-27 NOTE — Telephone Encounter (Addendum)
DMV paperwork signed by Dr. Lewayne Bunting faxed to the [facility] [phone] but after multiple attempts there is a "no answer" code.    Will continue to try to send.   Called the [facility] at [phone] for alt fax number but but held for 20 min with no answer from a rep.   Will return fax to Kelly/ nurse supervisor.   Addendum: Fax has gone through and confirmed.

## 2022-12-02 NOTE — Progress Notes (Signed)
Remote ICD transmission.   

## 2022-12-14 ENCOUNTER — Ambulatory Visit: Payer: Medicare Other | Attending: Internal Medicine

## 2022-12-14 DIAGNOSIS — Z95810 Presence of automatic (implantable) cardiac defibrillator: Secondary | ICD-10-CM | POA: Diagnosis not present

## 2022-12-14 DIAGNOSIS — I5022 Chronic systolic (congestive) heart failure: Secondary | ICD-10-CM

## 2022-12-18 NOTE — Progress Notes (Signed)
EPIC Encounter for ICM Monitoring  Patient Name: Lisa [name] is a 82 y.o. female Date: 12/18/2022 Primary Care Physican: Eartha Inch, MD Primary Cardiologist: Tenny Craw Electrophysiologist: Rae Roam Pacing: 98.3%         11/12/2021 Weight: 248 lbs 01/21/2022 Weight: 246 lbs 09/01/2022 Office Weight: 205 lbs   Time in AT/AF  0.0 hr/day (0.0%) Not on anticoagulation                                                            Transmission reviewed.    Optivol thoracic impedance suggesting normal fluid levels within the last month.     Prescribed:  Torsemide 20 mg take 1 tablet by mouth as directed.  Potassium 10 mEq Take 1 tablet by mouth daily.   Labs: 10/09/2022 Creatinine 1.54, BUN 21, Potassium 4.4, Sodium 140, GFR 31 06/26/2022 Creatinine 1.58, BUN 18, Potassium 4.3, Sodium 135, GFR 33 06/19/2022 Creatinine 1.15, BUN 17, Potassium 3.6, Sodium 136, GFR 48 06/18/2022 Creatinine 1.30, BUN 21, Potassium 3.3, Sodium 131, GFR 42  06/17/2022 Creatinine 1.62, BUN 25, Potassium 3.5, Sodium 136, GFR 32  06/16/2022 Creatinine 2.04, BUN 25, Potassium 4.1, Sodium 139, GFR 24  A complete set of results can be found in Results Review.   Recommendations:  No changes.     Follow-up plan: ICM clinic phone appointment on 01/18/2023.   91 day device clinic remote transmission 01/27/2023.     EP/Cardiology Office Visits:  Recall 12/25/2022 with Dr. Tenny Craw.  07/23/2023 with EP APP.   Copy of ICM check sent to Dr. Ladona Ridgel.    3 month ICM trend: 12/14/2022.    12-14 Month ICM trend:     Karie Soda, RN 12/18/2022 12:26 PM

## 2023-01-18 ENCOUNTER — Ambulatory Visit: Payer: Medicare Other | Attending: Internal Medicine

## 2023-01-18 DIAGNOSIS — I5022 Chronic systolic (congestive) heart failure: Secondary | ICD-10-CM

## 2023-01-18 DIAGNOSIS — Z95810 Presence of automatic (implantable) cardiac defibrillator: Secondary | ICD-10-CM | POA: Diagnosis not present

## 2023-01-20 ENCOUNTER — Telehealth: Payer: Self-pay

## 2023-01-20 NOTE — Progress Notes (Signed)
EPIC Encounter for ICM Monitoring  Patient Name: Lisa [name] is a 82 y.o. female Date: 01/20/2023 Primary Care Physican: Eartha Inch, MD Primary Cardiologist: Tenny Craw Electrophysiologist: Rae Roam Pacing: 98.0%         11/12/2021 Weight: 248 lbs 01/21/2022 Weight: 246 lbs 09/01/2022 Office Weight: 205 lbs   Time in AT/AF  0.0 hr/day (0.0%) Not on anticoagulation                                                            Attempted call to patient and unable to reach.  Left detailed message per DPR regarding transmission. Transmission reviewed.    Optivol thoracic impedance suggesting normal fluid levels within the last month.     Prescribed:  Torsemide 20 mg take 1 tablet by mouth as directed.  Potassium 10 mEq Take 1 tablet by mouth daily.   Labs: 10/09/2022 Creatinine 1.54, BUN 21, Potassium 4.4, Sodium 140, GFR 31 06/26/2022 Creatinine 1.58, BUN 18, Potassium 4.3, Sodium 135, GFR 33 06/19/2022 Creatinine 1.15, BUN 17, Potassium 3.6, Sodium 136, GFR 48 06/18/2022 Creatinine 1.30, BUN 21, Potassium 3.3, Sodium 131, GFR 42  06/17/2022 Creatinine 1.62, BUN 25, Potassium 3.5, Sodium 136, GFR 32  06/16/2022 Creatinine 2.04, BUN 25, Potassium 4.1, Sodium 139, GFR 24  A complete set of results can be found in Results Review.   Recommendations:  Left voice mail with ICM number and encouraged to call if experiencing any fluid symptoms.   Follow-up plan: ICM clinic phone appointment on 02/22/2023.   91 day device clinic remote transmission 04/28/2023.     EP/Cardiology Office Visits:  Left message 6/12 to call office to schedule OV with Dr Tenny Craw.  Recall 12/25/2022 with Dr. Tenny Craw.  07/23/2023 with EP APP.   Copy of ICM check sent to Dr. Ladona Ridgel.    3 month ICM trend: 01/18/2023.    12-14 Month ICM trend:     Karie Soda, RN 01/20/2023 10:27 AM

## 2023-01-20 NOTE — Telephone Encounter (Signed)
Remote ICM transmission received.  Attempted call to patient regarding ICM remote transmission and left detailed message per DPR.  Left ICM phone number and advised to return call for any fluid symptoms or questions. Next ICM remote transmission scheduled 02/22/2023.

## 2023-01-27 ENCOUNTER — Ambulatory Visit (INDEPENDENT_AMBULATORY_CARE_PROVIDER_SITE_OTHER): Payer: Medicare Other

## 2023-01-27 DIAGNOSIS — I255 Ischemic cardiomyopathy: Secondary | ICD-10-CM | POA: Diagnosis not present

## 2023-01-28 LAB — CUP PACEART REMOTE DEVICE CHECK
Battery Remaining Longevity: 11 mo
Battery Voltage: 2.86 V
Brady Statistic AP VP Percent: 0.47 %
Brady Statistic AP VS Percent: 0.03 %
Brady Statistic AS VP Percent: 97.49 %
Brady Statistic AS VS Percent: 2.01 %
Brady Statistic RA Percent Paced: 0.5 %
Brady Statistic RV Percent Paced: 0.42 %
Date Time Interrogation Session: 20240619033324
HighPow Impedance: 40 Ohm
Implantable Lead Connection Status: 753985
Implantable Lead Connection Status: 753985
Implantable Lead Connection Status: 753985
Implantable Lead Implant Date: 20170612
Implantable Lead Implant Date: 20170612
Implantable Lead Implant Date: 20170612
Implantable Lead Location: 753858
Implantable Lead Location: 753859
Implantable Lead Location: 753860
Implantable Lead Model: 4398
Implantable Lead Model: 5076
Implantable Pulse Generator Implant Date: 20170612
Lead Channel Impedance Value: 126.667
Lead Channel Impedance Value: 145.091
Lead Channel Impedance Value: 152 Ohm
Lead Channel Impedance Value: 166.25 Ohm
Lead Channel Impedance Value: 175.385
Lead Channel Impedance Value: 228 Ohm
Lead Channel Impedance Value: 228 Ohm
Lead Channel Impedance Value: 285 Ohm
Lead Channel Impedance Value: 304 Ohm
Lead Channel Impedance Value: 399 Ohm
Lead Channel Impedance Value: 399 Ohm
Lead Channel Impedance Value: 456 Ohm
Lead Channel Impedance Value: 456 Ohm
Lead Channel Impedance Value: 589 Ohm
Lead Channel Impedance Value: 589 Ohm
Lead Channel Impedance Value: 608 Ohm
Lead Channel Impedance Value: 608 Ohm
Lead Channel Impedance Value: 646 Ohm
Lead Channel Pacing Threshold Amplitude: 0.875 V
Lead Channel Pacing Threshold Amplitude: 1.5 V
Lead Channel Pacing Threshold Amplitude: 1.625 V
Lead Channel Pacing Threshold Pulse Width: 0.4 ms
Lead Channel Pacing Threshold Pulse Width: 0.4 ms
Lead Channel Pacing Threshold Pulse Width: 0.8 ms
Lead Channel Sensing Intrinsic Amplitude: 2.25 mV
Lead Channel Sensing Intrinsic Amplitude: 2.25 mV
Lead Channel Sensing Intrinsic Amplitude: 4.5 mV
Lead Channel Sensing Intrinsic Amplitude: 4.5 mV
Lead Channel Setting Pacing Amplitude: 1.75 V
Lead Channel Setting Pacing Amplitude: 2 V
Lead Channel Setting Pacing Amplitude: 2.5 V
Lead Channel Setting Pacing Pulse Width: 0.4 ms
Lead Channel Setting Pacing Pulse Width: 0.8 ms
Lead Channel Setting Sensing Sensitivity: 0.3 mV
Zone Setting Status: 755011
Zone Setting Status: 755011

## 2023-02-01 ENCOUNTER — Telehealth: Payer: Self-pay

## 2023-02-01 NOTE — Telephone Encounter (Signed)
Reviewed with Dr. Ladona Ridgel. Patient has a history of this, needs to get back on her meds (torsemide, magnesium and K), nothing further at this time unless repeat shock or becomes symptomatic.   If shock re-occurs and very sx, call 911 go to ER; otherwise contact device clinic ASAP, will need blood work.   Patient notified and verbalizes understanding.

## 2023-02-01 NOTE — Telephone Encounter (Signed)
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  Second event shows sustained VT/VF, rate 222, ATP delivered x2 unsuccessful, followed by 36J of HV therapy converting arrhythmia.  There is one additional NSVT 6/23.  Alert reset in [facility] Route to triage LA, CVRS   Patient reports she has not been taking her K, magnesium or torsemide regularly.  She notes s/s of fluid retention.  Denies SOB but has noted BLE edema.  Other than a restless night, she denies any symptoms that correlate with time frame of ATP/Shock event this am.  She was unaware.  Denies any other changes to diet, health or medications.   Patient informed of [place] DMV state law that she is not to drive for .

## 2023-02-22 ENCOUNTER — Ambulatory Visit: Payer: Medicare Other | Attending: Internal Medicine

## 2023-02-22 DIAGNOSIS — I5022 Chronic systolic (congestive) heart failure: Secondary | ICD-10-CM | POA: Diagnosis not present

## 2023-02-22 DIAGNOSIS — Z95810 Presence of automatic (implantable) cardiac defibrillator: Secondary | ICD-10-CM

## 2023-02-24 ENCOUNTER — Telehealth: Payer: Self-pay

## 2023-02-24 NOTE — Telephone Encounter (Signed)
Remote ICM transmission received.  Attempted call to patient regarding ICM remote transmission and left detailed message per DPR.  Left ICM phone number and advised to return call for any fluid symptoms or questions. Next ICM remote transmission scheduled 03/29/2023.

## 2023-02-24 NOTE — Progress Notes (Signed)
EPIC Encounter for ICM Monitoring  Patient Name: Lisa [name] is a 82 y.o. female Date: 02/24/2023 Primary Care Physican: Eartha Inch, MD Primary Cardiologist: Tenny Craw Electrophysiologist: Rae Roam Pacing: 98.3%         11/12/2021 Weight: 248 lbs 01/21/2022 Weight: 246 lbs 09/01/2022 Office Weight: 205 lbs   Time in AT/AF  0.0 hr/day (0.0%) Not on anticoagulation  Battery ERI:  5 months                                                            Attempted call to patient and unable to reach.  Left detailed message per DPR regarding transmission. Transmission reviewed.    Optivol thoracic impedance suggesting intermittent days with possible fluid accumulation within the last month.     Prescribed:  Torsemide 20 mg take 1 tablet by mouth as directed.  Potassium 10 mEq Take 1 tablet by mouth daily.   Labs: 10/09/2022 Creatinine 1.54, BUN 21, Potassium 4.4, Sodium 140, GFR 31 06/26/2022 Creatinine 1.58, BUN 18, Potassium 4.3, Sodium 135, GFR 33 06/19/2022 Creatinine 1.15, BUN 17, Potassium 3.6, Sodium 136, GFR 48 06/18/2022 Creatinine 1.30, BUN 21, Potassium 3.3, Sodium 131, GFR 42  06/17/2022 Creatinine 1.62, BUN 25, Potassium 3.5, Sodium 136, GFR 32  06/16/2022 Creatinine 2.04, BUN 25, Potassium 4.1, Sodium 139, GFR 24  A complete set of results can be found in Results Review.   Recommendations:  Left voice mail with ICM number and encouraged to call if experiencing any fluid symptoms.   Follow-up plan: ICM clinic phone appointment on 05/25/2023.   91 day device clinic remote transmission 04/28/2023.     EP/Cardiology Office Visits:  05/25/2023 with Dr Tenny Craw.  Recall 12/25/2022 with Dr. Tenny Craw.  Recall 07/23/2023 with EP APP.   Copy of ICM check sent to Dr. Ladona Ridgel.    3 month ICM trend: 02/22/2023.    12-14 Month ICM trend:     Karie Soda, RN 02/24/2023 2:24 PM

## 2023-03-29 ENCOUNTER — Ambulatory Visit: Payer: Medicare Other | Attending: Internal Medicine

## 2023-03-29 DIAGNOSIS — Z95810 Presence of automatic (implantable) cardiac defibrillator: Secondary | ICD-10-CM | POA: Diagnosis not present

## 2023-03-29 DIAGNOSIS — I5022 Chronic systolic (congestive) heart failure: Secondary | ICD-10-CM

## 2023-04-01 NOTE — Progress Notes (Signed)
EPIC Encounter for ICM Monitoring  Patient Name: Lisa [name] is a 82 y.o. female Date: 04/01/2023 Primary Care Physican: Eartha Inch, MD Primary Cardiologist: Tenny Craw Electrophysiologist: Rae Roam Pacing: 98.3%         11/12/2021 Weight: 248 lbs 01/21/2022 Weight: 246 lbs 09/01/2022 Office Weight: 205 lbs   Time in AT/AF  0.0 hr/day (0.0%) Not on anticoagulation   Battery ERI:  4 months                                                            Transmission reviewed.    Optivol thoracic impedance suggesting intermittent days with possible fluid accumulation within the last month.     Prescribed:  Torsemide 20 mg take 1 tablet by mouth as directed.  Potassium 10 mEq Take 1 tablet by mouth daily.   Labs: 10/09/2022 Creatinine 1.54, BUN 21, Potassium 4.4, Sodium 140, GFR 31 A complete set of results can be found in Results Review.   Recommendations:  No changes.   Follow-up plan: ICM clinic phone appointment on 05/03/2023.   91 day device clinic remote transmission 04/28/2023.     EP/Cardiology Office Visits:  05/25/2023 with Dr Tenny Craw.  Recall 07/23/2023 with EP APP.   Copy of ICM check sent to Dr. Ladona Ridgel.     3 month ICM trend: 03/29/2023.    12-14 Month ICM trend:     Karie Soda, RN 04/01/2023 7:59 AM

## 2023-04-06 ENCOUNTER — Encounter: Payer: Self-pay | Admitting: Internal Medicine

## 2023-04-28 ENCOUNTER — Ambulatory Visit (INDEPENDENT_AMBULATORY_CARE_PROVIDER_SITE_OTHER): Payer: Medicare Other

## 2023-04-28 DIAGNOSIS — I4901 Ventricular fibrillation: Secondary | ICD-10-CM

## 2023-04-28 LAB — CUP PACEART REMOTE DEVICE CHECK
Battery Remaining Longevity: 4 mo
Battery Voltage: 2.82 V
Brady Statistic AP VP Percent: 0.26 %
Brady Statistic AP VS Percent: 0.02 %
Brady Statistic AS VP Percent: 98.15 %
Brady Statistic AS VS Percent: 1.58 %
Brady Statistic RA Percent Paced: 0.28 %
Brady Statistic RV Percent Paced: 0.21 %
Date Time Interrogation Session: 20240918043724
HighPow Impedance: 42 Ohm
Implantable Lead Connection Status: 753985
Implantable Lead Connection Status: 753985
Implantable Lead Connection Status: 753985
Implantable Lead Implant Date: 20170612
Implantable Lead Implant Date: 20170612
Implantable Lead Implant Date: 20170612
Implantable Lead Location: 753858
Implantable Lead Location: 753859
Implantable Lead Location: 753860
Implantable Lead Model: 4398
Implantable Lead Model: 5076
Implantable Pulse Generator Implant Date: 20170612
Lead Channel Impedance Value: 136.276
Lead Channel Impedance Value: 152.559
Lead Channel Impedance Value: 155.257
Lead Channel Impedance Value: 172.541
Lead Channel Impedance Value: 176 Ohm
Lead Channel Impedance Value: 228 Ohm
Lead Channel Impedance Value: 247 Ohm
Lead Channel Impedance Value: 304 Ohm
Lead Channel Impedance Value: 304 Ohm
Lead Channel Impedance Value: 399 Ohm
Lead Channel Impedance Value: 399 Ohm
Lead Channel Impedance Value: 418 Ohm
Lead Channel Impedance Value: 475 Ohm
Lead Channel Impedance Value: 551 Ohm
Lead Channel Impedance Value: 589 Ohm
Lead Channel Impedance Value: 608 Ohm
Lead Channel Impedance Value: 646 Ohm
Lead Channel Impedance Value: 646 Ohm
Lead Channel Pacing Threshold Amplitude: 1.25 V
Lead Channel Pacing Threshold Amplitude: 1.25 V
Lead Channel Pacing Threshold Amplitude: 1.5 V
Lead Channel Pacing Threshold Pulse Width: 0.4 ms
Lead Channel Pacing Threshold Pulse Width: 0.4 ms
Lead Channel Pacing Threshold Pulse Width: 0.8 ms
Lead Channel Sensing Intrinsic Amplitude: 2.125 mV
Lead Channel Sensing Intrinsic Amplitude: 2.125 mV
Lead Channel Sensing Intrinsic Amplitude: 3.875 mV
Lead Channel Sensing Intrinsic Amplitude: 3.875 mV
Lead Channel Setting Pacing Amplitude: 1.75 V
Lead Channel Setting Pacing Amplitude: 2 V
Lead Channel Setting Pacing Amplitude: 2.5 V
Lead Channel Setting Pacing Pulse Width: 0.4 ms
Lead Channel Setting Pacing Pulse Width: 0.8 ms
Lead Channel Setting Sensing Sensitivity: 0.3 mV
Zone Setting Status: 755011
Zone Setting Status: 755011

## 2023-05-03 ENCOUNTER — Ambulatory Visit: Payer: Medicare Other | Attending: Internal Medicine

## 2023-05-03 DIAGNOSIS — I5022 Chronic systolic (congestive) heart failure: Secondary | ICD-10-CM | POA: Diagnosis not present

## 2023-05-03 DIAGNOSIS — Z95810 Presence of automatic (implantable) cardiac defibrillator: Secondary | ICD-10-CM

## 2023-05-04 NOTE — Progress Notes (Signed)
Remote ICD transmission.   

## 2023-05-06 NOTE — Progress Notes (Signed)
EPIC Encounter for ICM Monitoring  Patient Name: Lisa [name] is a 82 y.o. female Date: 05/06/2023 Primary Care Physican: Eartha Inch, MD Primary Cardiologist: Tenny Craw Electrophysiologist: Rae Roam Pacing: 98.3%         11/12/2021 Weight: 248 lbs 01/21/2022 Weight: 246 lbs 09/01/2022 Office Weight: 205 lbs   Time in AT/AF  0.0 hr/day (0.0%) Not on anticoagulation   Battery ERI:  6 months                                                            Transmission reviewed.    Optivol thoracic impedance suggesting normal fluid levels with the exception of possible fluid accumulation from 9/3-9/11.     Prescribed:  Torsemide 20 mg take 1 tablet by mouth as directed.  Potassium 10 mEq Take 1 tablet by mouth daily.   Labs: 10/09/2022 Creatinine 1.54, BUN 21, Potassium 4.4, Sodium 140, GFR 31 A complete set of results can be found in Results Review.   Recommendations:  No changes.   Follow-up plan: ICM clinic phone appointment on 06/07/2023.   91 day device clinic remote transmission 08/02/2023.     EP/Cardiology Office Visits:  05/25/2023 with Dr Tenny Craw.  Recall 07/23/2023 with EP APP.   Copy of ICM check sent to Dr. Ladona Ridgel.     3 month ICM trend: 05/03/2023.    12-14 Month ICM trend:     Karie Soda, RN 05/06/2023 8:19 AM

## 2023-05-12 NOTE — Progress Notes (Signed)
Remote ICD transmission.   

## 2023-05-24 NOTE — Progress Notes (Deleted)
Cardiology Office Note   Date:  05/24/2023   ID:  Lisa [name], DOB 08-13-1940, MRN [id number]  PCP:  Eartha Inch, MD  Cardiologist:   Dietrich Pates, MD   Lisa/U of CHF      History of Present Illness:Lisa [name] is a 82 y.o. 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  Cardiac catheterization was done.  This showed a 65% ramus narrowing.  LVEDP was moderately elevated at 23.[name]  LVEF was 50 to 55%.  She was seen by EP.  Placed on amiodarone.  I saw the pt in clinic in March 2023  Pt has been a little more SOB   Fluid was up by optivol in Aug   Addressed   Numbers returned to normal on 9/5  Breating is about the same now   Still gets SOB with activity  Says it is about the same.  I saw the pt in Sept 2023   She was seen by Rosette Reveal in Jan 2024   No outpatient medications have been marked as taking for the 05/25/23 encounter (Appointment) with Pricilla Riffle, MD.     Allergies:   Flagyl [metronidazole], Penicillins, Sulfamethoxazole, Meperidine, Morphine and codeine, Propoxyphene, Tape, Doxycycline, Lanolin, and Sulfa antibiotics   Past Medical History:  Diagnosis Date   CAD in native artery 2017   stent to LAD   Cardiac arrest (HCC) 09/2015   CHF (congestive heart failure) (HCC)    CML (chronic myelocytic leukemia) (HCC)    Complication of anesthesia    Hx: UTI (urinary tract infection)    Hypertension    Melanoma (HCC)    PONV (postoperative nausea and vomiting)    PVC (premature ventricular contraction)    HISTORY OF   Thyroid disease    V tach  (HCC)     Past Surgical History:  Procedure Laterality Date   ABDOMINAL HYSTERECTOMY     APPENDECTOMY     CARDIAC CATHETERIZATION N/A 10/03/2015   Procedure: Left Heart Cath and Coronary Angiography;  Surgeon: Peter M Swaziland, MD;  Location: [facility] INVASIVE CV LAB;  Service: Cardiovascular;  Laterality: N/A;   CARDIAC CATHETERIZATION N/A 10/07/2015   Procedure: Coronary Stent Intervention;  Surgeon: Kathleene Hazel, MD;  Location: [facility] INVASIVE CV LAB;  Service: Cardiovascular;  Laterality: N/A;   CARDIAC CATHETERIZATION  09/22/2019   EP IMPLANTABLE DEVICE N/A 01/20/2016   Procedure: BiV ICD Insertion CRT-D;  Surgeon: Marinus Maw, MD;  Location: [facility] INVASIVE CV LAB;  Service: Cardiovascular;  Laterality: N/A;   LEFT HEART CATH AND CORONARY ANGIOGRAPHY N/A 09/22/2019   Procedure: LEFT HEART CATH AND CORONARY ANGIOGRAPHY;  Surgeon: Swaziland, Peter M, MD;  Location: [facility] INVASIVE CV LAB;  Service: Cardiovascular;  Laterality: N/A;   TONSILLECTOMY     TONSILLECTOMY     TUBAL LIGATION       Social History:  The patient  reports that she has never smoked. She has never used smokeless tobacco. She reports that she does not drink alcohol and does  not use drugs.   Family History:  The patient's family history includes Coronary artery disease in her mother; Heart attack in her maternal grandfather and paternal grandfather; Multiple myeloma in her father; Thyroid disease in her mother.    ROS:  Please see the history of present illness. All other systems are reviewed and  Negative to the above problem except as noted.    PHYSICAL EXAM: VS:  LMP 02/14/1995   GEN: Morbidly obese 82 yo in no acute distress   Examined in chair HEENT: normal  Neck: Neck full  No obvious JVD   \Cardiac: RRR; no murmurs   Lungs Clear to auscultation Abdomen   Obese  Nontender  Leg:   Chronic lymphedema of legs  Skin: warm and dry  EKG:  EKG is not done today    Echo  Nov 2022  There is turbulence in distal  outflow tract which is contributing to murmur (no significant gradiet). Left ventricular ejection fraction, by estimation, is 45 to 50%. The left ventricle has mildly decreased function. The left ventricle demonstrates global hypokinesis. The left ventricular internal cavity size was mildly to moderately dilated. There is mild left ventricular hypertrophy of the posterior segment. Left ventricular diastolic parameters are consistent with Grade II diastolic dysfunction (pseudonormalization). 1. Right ventricular systolic function is normal. The right ventricular size is normal. There is normal pulmonary artery systolic pressure. 2. 3. Left atrial size was severely dilated. 4. Right atrial size was mildly dilated. The mitral valve is normal in structure. Moderate mitral valve regurgitation. No evidence of mitral stenosis. 5. The aortic valve is tricuspid. There is moderate calcification of the aortic valve. There is mild thickening of the aortic valve. Aortic valve regurgitation is not visualized. Mild to moderate aortic valve sclerosis/calcification is present, without any evidence of aortic stenosis. 6. The inferior vena cava is dilated in size with >50% respiratory variability, suggesting right atrial pressure of 8 mmHg.  Echo   9.22.2Lipid Panel    Component Value Date/Time   CHOL 92 (L) 10/06/2019 1059   TRIG 92 10/06/2019 1059   HDL 39 (L) 10/06/2019 1059   CHOLHDL 2.4 10/06/2019 1059   CHOLHDL 4.2 10/04/2015 0329   VLDL 18 10/04/2015 0329   LDLCALC 35 10/06/2019 1059      Wt Readings from Last 3 Encounters:  09/01/22 205 lb (93 kg)  06/29/22 205 lb (93 kg)  06/18/22 216 lb 14.9 oz (98.4 kg)      ASSESSMENT AND PLAN:  1  Hx chronic systo;occ CHF   Pt now with BiV ICD     Echo n Nov 2022 showed mild LV dysfunction     Keep on same regimen   Follow Optivol   2   CAD PT had cath in February 2021 after a VT event.  No obstructive CAD noted.Patent stent Pt denies CP   Breathing stable   3  Hx VF arrest   S/p Medtr[employer] CRT-D.  Continue amiodarone.  Follow with EP  4 Transient PAF short-lived she is not on anticoagulation. Continue to check device   5  HTN  BP is controlled   6   Lipids  LDL 35  HDL 39  Trig 92  Cont meds     7.  Thyroid  TSH was way off on recent labs   Will check full panel    Uses armour thyroid   Can have variability    Also on amio   8  CML.  Continues to  follow with oncology  Continue on Gleevac   Dietrich Pates  Current medicines are reviewed at length with the patient today.  The patient does not have concerns regarding medicines.  Signed, Dietrich Pates, MD  05/24/2023 8:40 PM    [facility] [address] [place], [place], [place]  [address] Phone: [phone]; Fax: [phone]

## 2023-05-25 ENCOUNTER — Ambulatory Visit: Payer: Medicare Other | Admitting: Internal Medicine

## 2023-05-31 ENCOUNTER — Ambulatory Visit (INDEPENDENT_AMBULATORY_CARE_PROVIDER_SITE_OTHER): Payer: Medicare Other

## 2023-05-31 DIAGNOSIS — I255 Ischemic cardiomyopathy: Secondary | ICD-10-CM

## 2023-05-31 DIAGNOSIS — I5022 Chronic systolic (congestive) heart failure: Secondary | ICD-10-CM

## 2023-05-31 LAB — CUP PACEART REMOTE DEVICE CHECK
Battery Remaining Longevity: 5 mo
Battery Voltage: 2.81 V
Brady Statistic AP VP Percent: 0.18 %
Brady Statistic AP VS Percent: 0.02 %
Brady Statistic AS VP Percent: 98.22 %
Brady Statistic AS VS Percent: 1.58 %
Brady Statistic RA Percent Paced: 0.2 %
Brady Statistic RV Percent Paced: 0.32 %
Date Time Interrogation Session: 20241021043823
HighPow Impedance: 41 Ohm
Implantable Lead Connection Status: 753985
Implantable Lead Connection Status: 753985
Implantable Lead Connection Status: 753985
Implantable Lead Implant Date: 20170612
Implantable Lead Implant Date: 20170612
Implantable Lead Implant Date: 20170612
Implantable Lead Location: 753858
Implantable Lead Location: 753859
Implantable Lead Location: 753860
Implantable Lead Model: 4398
Implantable Lead Model: 5076
Implantable Pulse Generator Implant Date: 20170612
Lead Channel Impedance Value: 132.321
Lead Channel Impedance Value: 155.257
Lead Channel Impedance Value: 155.257
Lead Channel Impedance Value: 169.459
Lead Channel Impedance Value: 169.459
Lead Channel Impedance Value: 228 Ohm
Lead Channel Impedance Value: 247 Ohm
Lead Channel Impedance Value: 285 Ohm
Lead Channel Impedance Value: 285 Ohm
Lead Channel Impedance Value: 399 Ohm
Lead Channel Impedance Value: 418 Ohm
Lead Channel Impedance Value: 418 Ohm
Lead Channel Impedance Value: 475 Ohm
Lead Channel Impedance Value: 551 Ohm
Lead Channel Impedance Value: 589 Ohm
Lead Channel Impedance Value: 608 Ohm
Lead Channel Impedance Value: 608 Ohm
Lead Channel Impedance Value: 608 Ohm
Lead Channel Pacing Threshold Amplitude: 1.25 V
Lead Channel Pacing Threshold Amplitude: 1.25 V
Lead Channel Pacing Threshold Amplitude: 1.5 V
Lead Channel Pacing Threshold Pulse Width: 0.4 ms
Lead Channel Pacing Threshold Pulse Width: 0.4 ms
Lead Channel Pacing Threshold Pulse Width: 0.8 ms
Lead Channel Sensing Intrinsic Amplitude: 1.875 mV
Lead Channel Sensing Intrinsic Amplitude: 1.875 mV
Lead Channel Sensing Intrinsic Amplitude: 4.125 mV
Lead Channel Sensing Intrinsic Amplitude: 4.125 mV
Lead Channel Setting Pacing Amplitude: 1.75 V
Lead Channel Setting Pacing Amplitude: 2 V
Lead Channel Setting Pacing Amplitude: 2.5 V
Lead Channel Setting Pacing Pulse Width: 0.4 ms
Lead Channel Setting Pacing Pulse Width: 0.8 ms
Lead Channel Setting Sensing Sensitivity: 0.3 mV
Zone Setting Status: 755011
Zone Setting Status: 755011

## 2023-06-01 ENCOUNTER — Encounter: Payer: Self-pay | Admitting: Internal Medicine

## 2023-06-01 ENCOUNTER — Other Ambulatory Visit (HOSPITAL_COMMUNITY): Payer: Self-pay

## 2023-06-01 MED ORDER — TORSEMIDE 20 MG PO TABS: 20.0000 mg | ORAL_TABLET | ORAL | 0 refills | Status: DC

## 2023-06-01 MED ORDER — TORSEMIDE 20 MG PO TABS
20.0000 mg | ORAL_TABLET | ORAL | 0 refills | Status: DC
  Filled 2023-06-01: qty 90, fill #0

## 2023-06-02 ENCOUNTER — Other Ambulatory Visit: Payer: Self-pay | Admitting: Student

## 2023-06-02 DIAGNOSIS — I5022 Chronic systolic (congestive) heart failure: Secondary | ICD-10-CM

## 2023-06-07 ENCOUNTER — Ambulatory Visit: Payer: Medicare Other | Attending: Internal Medicine

## 2023-06-07 DIAGNOSIS — Z95810 Presence of automatic (implantable) cardiac defibrillator: Secondary | ICD-10-CM | POA: Diagnosis not present

## 2023-06-07 DIAGNOSIS — I5022 Chronic systolic (congestive) heart failure: Secondary | ICD-10-CM | POA: Diagnosis not present

## 2023-06-15 NOTE — Progress Notes (Signed)
Remote ICD transmission.   

## 2023-06-16 NOTE — Progress Notes (Signed)
EPIC Encounter for ICM Monitoring  Patient Name: Lisa [name] is a 82 y.o. female Date: 06/16/2023 Primary Care Physican: Eartha Inch, MD Primary Cardiologist: Tenny Craw Electrophysiologist: Rae Roam Pacing: 98.3%         11/12/2021 Weight: 248 lbs 01/21/2022 Weight: 246 lbs 09/01/2022 Office Weight: 205 lbs 04/23/2023 Office Weight: 206 lbs   Time in AT/AF  0.0 hr/day (0.0%) Not on anticoagulation   Battery ERI:  5 months                                                            Transmission reviewed.    Optivol thoracic impedance suggesting intermittent days with possible fluid accumulation since 10/1.    Prescribed:  Torsemide 20 mg take 1 tablet by mouth as directed.  Potassium 10 mEq Take 1 tablet by mouth daily.   Labs: 10/09/2022 Creatinine 1.54, BUN 21, Potassium 4.4, Sodium 140, GFR 31 A complete set of results can be found in Results Review.   Recommendations:  No changes.   Follow-up plan: ICM clinic phone appointment on 07/19/2023.   91 day device clinic remote transmission 08/02/2023.     EP/Cardiology Office Visits:  09/07/2023 with Dr Tenny Craw.  07/27/2023 with Dr Ladona Ridgel.   Copy of ICM check sent to Dr. Ladona Ridgel.     3 month ICM trend: 06/07/2023.    12-14 Month ICM trend:     Karie Soda, RN 06/16/2023 3:15 PM

## 2023-07-01 ENCOUNTER — Ambulatory Visit: Payer: Medicare Other

## 2023-07-01 DIAGNOSIS — I255 Ischemic cardiomyopathy: Secondary | ICD-10-CM

## 2023-07-01 LAB — CUP PACEART REMOTE DEVICE CHECK
Battery Remaining Longevity: 5 mo
Battery Voltage: 2.79 V
Brady Statistic AP VP Percent: 0.05 %
Brady Statistic AP VS Percent: 0.01 %
Brady Statistic AS VP Percent: 98.38 %
Brady Statistic AS VS Percent: 1.56 %
Brady Statistic RA Percent Paced: 0.07 %
Brady Statistic RV Percent Paced: 0.09 %
Date Time Interrogation Session: 20241121002202
HighPow Impedance: 40 Ohm
Implantable Lead Connection Status: 753985
Implantable Lead Connection Status: 753985
Implantable Lead Connection Status: 753985
Implantable Lead Implant Date: 20170612
Implantable Lead Implant Date: 20170612
Implantable Lead Implant Date: 20170612
Implantable Lead Location: 753858
Implantable Lead Location: 753859
Implantable Lead Location: 753860
Implantable Lead Model: 4398
Implantable Lead Model: 5076
Implantable Pulse Generator Implant Date: 20170612
Lead Channel Impedance Value: 147.097
Lead Channel Impedance Value: 175.385
Lead Channel Impedance Value: 178.125
Lead Channel Impedance Value: 182.4 Ohm
Lead Channel Impedance Value: 185.366
Lead Channel Impedance Value: 247 Ohm
Lead Channel Impedance Value: 285 Ohm
Lead Channel Impedance Value: 304 Ohm
Lead Channel Impedance Value: 304 Ohm
Lead Channel Impedance Value: 399 Ohm
Lead Channel Impedance Value: 456 Ohm
Lead Channel Impedance Value: 475 Ohm
Lead Channel Impedance Value: 475 Ohm
Lead Channel Impedance Value: 608 Ohm
Lead Channel Impedance Value: 646 Ohm
Lead Channel Impedance Value: 646 Ohm
Lead Channel Impedance Value: 665 Ohm
Lead Channel Impedance Value: 665 Ohm
Lead Channel Pacing Threshold Amplitude: 1 V
Lead Channel Pacing Threshold Amplitude: 1.125 V
Lead Channel Pacing Threshold Amplitude: 1.375 V
Lead Channel Pacing Threshold Pulse Width: 0.4 ms
Lead Channel Pacing Threshold Pulse Width: 0.4 ms
Lead Channel Pacing Threshold Pulse Width: 0.8 ms
Lead Channel Sensing Intrinsic Amplitude: 1.875 mV
Lead Channel Sensing Intrinsic Amplitude: 1.875 mV
Lead Channel Sensing Intrinsic Amplitude: 4.625 mV
Lead Channel Sensing Intrinsic Amplitude: 4.625 mV
Lead Channel Setting Pacing Amplitude: 1.75 V
Lead Channel Setting Pacing Amplitude: 2 V
Lead Channel Setting Pacing Amplitude: 2.5 V
Lead Channel Setting Pacing Pulse Width: 0.4 ms
Lead Channel Setting Pacing Pulse Width: 0.8 ms
Lead Channel Setting Sensing Sensitivity: 0.3 mV
Zone Setting Status: 755011
Zone Setting Status: 755011

## 2023-07-17 ENCOUNTER — Other Ambulatory Visit: Payer: Self-pay

## 2023-07-17 ENCOUNTER — Encounter (HOSPITAL_COMMUNITY): Payer: Self-pay | Admitting: Emergency Medicine

## 2023-07-17 ENCOUNTER — Emergency Department (HOSPITAL_COMMUNITY): Payer: Medicare Other

## 2023-07-17 ENCOUNTER — Inpatient Hospital Stay (HOSPITAL_COMMUNITY)
Admission: EM | Admit: 2023-07-17 | Discharge: 2023-07-21 | DRG: 602 | Disposition: A | Discharge status: Home Health | Payer: Medicare Other | Attending: Internal Medicine | Admitting: Internal Medicine

## 2023-07-17 DIAGNOSIS — J9811 Atelectasis: Secondary | ICD-10-CM | POA: Diagnosis present

## 2023-07-17 DIAGNOSIS — C9210 Chronic myeloid leukemia, BCR/ABL-positive, not having achieved remission: Secondary | ICD-10-CM | POA: Diagnosis present

## 2023-07-17 DIAGNOSIS — Z882 Allergy status to sulfonamides status: Secondary | ICD-10-CM | POA: Diagnosis not present

## 2023-07-17 DIAGNOSIS — Z8674 Personal history of sudden cardiac arrest: Secondary | ICD-10-CM

## 2023-07-17 DIAGNOSIS — I890 Lymphedema, not elsewhere classified: Secondary | ICD-10-CM | POA: Diagnosis present

## 2023-07-17 DIAGNOSIS — R0603 Acute respiratory distress: Secondary | ICD-10-CM | POA: Diagnosis present

## 2023-07-17 DIAGNOSIS — S81801A Unspecified open wound, right lower leg, initial encounter: Secondary | ICD-10-CM | POA: Diagnosis present

## 2023-07-17 DIAGNOSIS — I2510 Atherosclerotic heart disease of native coronary artery without angina pectoris: Secondary | ICD-10-CM | POA: Diagnosis present

## 2023-07-17 DIAGNOSIS — I5032 Chronic diastolic (congestive) heart failure: Secondary | ICD-10-CM | POA: Diagnosis not present

## 2023-07-17 DIAGNOSIS — I872 Venous insufficiency (chronic) (peripheral): Secondary | ICD-10-CM | POA: Diagnosis present

## 2023-07-17 DIAGNOSIS — A498 Other bacterial infections of unspecified site: Secondary | ICD-10-CM | POA: Diagnosis not present

## 2023-07-17 DIAGNOSIS — N1831 Chronic kidney disease, stage 3a: Secondary | ICD-10-CM | POA: Diagnosis present

## 2023-07-17 DIAGNOSIS — Z807 Family history of other malignant neoplasms of lymphoid, hematopoietic and related tissues: Secondary | ICD-10-CM

## 2023-07-17 DIAGNOSIS — Z881 Allergy status to other antibiotic agents status: Secondary | ICD-10-CM

## 2023-07-17 DIAGNOSIS — Z7982 Long term (current) use of aspirin: Secondary | ICD-10-CM

## 2023-07-17 DIAGNOSIS — E669 Obesity, unspecified: Secondary | ICD-10-CM | POA: Insufficient documentation

## 2023-07-17 DIAGNOSIS — Z91048 Other nonmedicinal substance allergy status: Secondary | ICD-10-CM

## 2023-07-17 DIAGNOSIS — E038 Other specified hypothyroidism: Secondary | ICD-10-CM | POA: Diagnosis not present

## 2023-07-17 DIAGNOSIS — R0902 Hypoxemia: Secondary | ICD-10-CM | POA: Diagnosis present

## 2023-07-17 DIAGNOSIS — E66811 Obesity, class 1: Secondary | ICD-10-CM | POA: Diagnosis not present

## 2023-07-17 DIAGNOSIS — R609 Edema, unspecified: Secondary | ICD-10-CM

## 2023-07-17 DIAGNOSIS — Z79890 Hormone replacement therapy (postmenopausal): Secondary | ICD-10-CM

## 2023-07-17 DIAGNOSIS — I5033 Acute on chronic diastolic (congestive) heart failure: Secondary | ICD-10-CM | POA: Diagnosis present

## 2023-07-17 DIAGNOSIS — L03119 Cellulitis of unspecified part of limb: Secondary | ICD-10-CM | POA: Diagnosis present

## 2023-07-17 DIAGNOSIS — L03115 Cellulitis of right lower limb: Secondary | ICD-10-CM | POA: Diagnosis present

## 2023-07-17 DIAGNOSIS — I130 Hypertensive heart and chronic kidney disease with heart failure and stage 1 through stage 4 chronic kidney disease, or unspecified chronic kidney disease: Secondary | ICD-10-CM | POA: Diagnosis present

## 2023-07-17 DIAGNOSIS — J90 Pleural effusion, not elsewhere classified: Secondary | ICD-10-CM | POA: Diagnosis present

## 2023-07-17 DIAGNOSIS — E039 Hypothyroidism, unspecified: Secondary | ICD-10-CM | POA: Diagnosis present

## 2023-07-17 DIAGNOSIS — Z885 Allergy status to narcotic agent status: Secondary | ICD-10-CM

## 2023-07-17 DIAGNOSIS — Z90710 Acquired absence of both cervix and uterus: Secondary | ICD-10-CM

## 2023-07-17 DIAGNOSIS — Z8249 Family history of ischemic heart disease and other diseases of the circulatory system: Secondary | ICD-10-CM

## 2023-07-17 DIAGNOSIS — I5020 Unspecified systolic (congestive) heart failure: Secondary | ICD-10-CM | POA: Diagnosis present

## 2023-07-17 DIAGNOSIS — R7989 Other specified abnormal findings of blood chemistry: Secondary | ICD-10-CM | POA: Diagnosis not present

## 2023-07-17 DIAGNOSIS — N179 Acute kidney failure, unspecified: Secondary | ICD-10-CM | POA: Diagnosis present

## 2023-07-17 DIAGNOSIS — D539 Nutritional anemia, unspecified: Secondary | ICD-10-CM | POA: Diagnosis present

## 2023-07-17 DIAGNOSIS — Z888 Allergy status to other drugs, medicaments and biological substances status: Secondary | ICD-10-CM

## 2023-07-17 DIAGNOSIS — R531 Weakness: Secondary | ICD-10-CM | POA: Diagnosis not present

## 2023-07-17 DIAGNOSIS — J9601 Acute respiratory failure with hypoxia: Secondary | ICD-10-CM | POA: Diagnosis not present

## 2023-07-17 DIAGNOSIS — D638 Anemia in other chronic diseases classified elsewhere: Secondary | ICD-10-CM | POA: Diagnosis not present

## 2023-07-17 DIAGNOSIS — Z880 Allergy status to penicillin: Secondary | ICD-10-CM | POA: Diagnosis not present

## 2023-07-17 DIAGNOSIS — I10 Essential (primary) hypertension: Secondary | ICD-10-CM | POA: Diagnosis not present

## 2023-07-17 DIAGNOSIS — N183 Chronic kidney disease, stage 3 unspecified: Secondary | ICD-10-CM | POA: Diagnosis present

## 2023-07-17 DIAGNOSIS — Z955 Presence of coronary angioplasty implant and graft: Secondary | ICD-10-CM

## 2023-07-17 DIAGNOSIS — W19XXXA Unspecified fall, initial encounter: Secondary | ICD-10-CM | POA: Diagnosis not present

## 2023-07-17 DIAGNOSIS — Z1624 Resistance to multiple antibiotics: Secondary | ICD-10-CM | POA: Diagnosis present

## 2023-07-17 DIAGNOSIS — Z85820 Personal history of malignant melanoma of skin: Secondary | ICD-10-CM | POA: Diagnosis not present

## 2023-07-17 DIAGNOSIS — Z95810 Presence of automatic (implantable) cardiac defibrillator: Secondary | ICD-10-CM | POA: Diagnosis not present

## 2023-07-17 DIAGNOSIS — Z6836 Body mass index (BMI) 36.0-36.9, adult: Secondary | ICD-10-CM

## 2023-07-17 DIAGNOSIS — N1832 Chronic kidney disease, stage 3b: Secondary | ICD-10-CM | POA: Diagnosis not present

## 2023-07-17 DIAGNOSIS — Z79899 Other long term (current) drug therapy: Secondary | ICD-10-CM | POA: Diagnosis not present

## 2023-07-17 DIAGNOSIS — Z87440 Personal history of urinary (tract) infections: Secondary | ICD-10-CM

## 2023-07-17 DIAGNOSIS — R7401 Elevation of levels of liver transaminase levels: Secondary | ICD-10-CM | POA: Diagnosis not present

## 2023-07-17 LAB — COMPREHENSIVE METABOLIC PANEL
ALT: 17 U/L (ref 0–44)
AST: 23 U/L (ref 15–41)
Albumin: 3.4 g/dL — ABNORMAL LOW (ref 3.5–5.0)
Alkaline Phosphatase: 65 U/L (ref 38–126)
Anion gap: 14 (ref 5–15)
BUN: 17 mg/dL (ref 8–23)
CO2: 18 mmol/L — ABNORMAL LOW (ref 22–32)
Calcium: 8.7 mg/dL — ABNORMAL LOW (ref 8.9–10.3)
Chloride: 108 mmol/L (ref 98–111)
Creatinine, Ser: 1.37 mg/dL — ABNORMAL HIGH (ref 0.44–1.00)
GFR, Estimated: 39 mL/min — ABNORMAL LOW (ref >60)
Glucose, Bld: 120 mg/dL — ABNORMAL HIGH (ref 70–99)
Potassium: 3.9 mmol/L (ref 3.5–5.1)
Sodium: 140 mmol/L (ref 135–145)
Total Bilirubin: 1.2 mg/dL — ABNORMAL HIGH (ref <1.2)
Total Protein: 6.6 g/dL (ref 6.5–8.1)

## 2023-07-17 LAB — TYPE AND SCREEN
ABO/RH(D): B POS
Antibody Screen: NEGATIVE

## 2023-07-17 LAB — HEMOGLOBIN A1C
Hgb A1c MFr Bld: 5.4 % (ref 4.8–5.6)
Mean Plasma Glucose: 108.28 mg/dL

## 2023-07-17 LAB — CBC
HCT: 24.8 % — ABNORMAL LOW (ref 36.0–46.0)
Hemoglobin: 7.7 g/dL — ABNORMAL LOW (ref 12.0–15.0)
MCH: 31.7 pg (ref 26.0–34.0)
MCHC: 31 g/dL (ref 30.0–36.0)
MCV: 102.1 fL — ABNORMAL HIGH (ref 80.0–100.0)
Platelets: 213 10*3/uL (ref 150–400)
RBC: 2.43 MIL/uL — ABNORMAL LOW (ref 3.87–5.11)
RDW: 14.5 % (ref 11.5–15.5)
WBC: 13.5 10*3/uL — ABNORMAL HIGH (ref 4.0–10.5)
nRBC: 0 % (ref 0.0–0.2)

## 2023-07-17 LAB — PREALBUMIN: Prealbumin: 17 mg/dL — ABNORMAL LOW (ref 18–38)

## 2023-07-17 LAB — TROPONIN I (HIGH SENSITIVITY)
Troponin I (High Sensitivity): 26 ng/L — ABNORMAL HIGH (ref <18)
Troponin I (High Sensitivity): 53 ng/L — ABNORMAL HIGH (ref <18)

## 2023-07-17 LAB — SEDIMENTATION RATE: Sed Rate: 22 mm/h (ref 0–22)

## 2023-07-17 LAB — C-REACTIVE PROTEIN: CRP: 7.5 mg/dL — ABNORMAL HIGH (ref <1.0)

## 2023-07-17 LAB — LIPASE, BLOOD: Lipase: 29 U/L (ref 11–51)

## 2023-07-17 LAB — TSH: TSH: 1.619 u[IU]/mL (ref 0.350–4.500)

## 2023-07-17 LAB — I-STAT CG4 LACTIC ACID, ED: Lactic Acid, Venous: 2 mmol/L (ref 0.5–1.9)

## 2023-07-17 LAB — CG4 I-STAT (LACTIC ACID): Lactic Acid, Venous: 0.9 mmol/L (ref 0.5–1.9)

## 2023-07-17 LAB — BRAIN NATRIURETIC PEPTIDE: B Natriuretic Peptide: 803.7 pg/mL — ABNORMAL HIGH (ref 0.0–100.0)

## 2023-07-17 MED ORDER — SODIUM CHLORIDE 0.9 % IV SOLN
2.0000 g | Freq: Two times a day (BID) | INTRAVENOUS | Status: DC
  Administered 2023-07-18: 2 g via INTRAVENOUS
  Filled 2023-07-17 (×2): qty 12.5

## 2023-07-17 MED ORDER — ACETAMINOPHEN 325 MG PO TABS: 650.0000 mg | ORAL_TABLET | Freq: Four times a day (QID) | ORAL | Status: DC | PRN

## 2023-07-17 MED ORDER — ONDANSETRON HCL 4 MG/2ML IJ SOLN
4.0000 mg | Freq: Once | INTRAMUSCULAR | Status: AC
  Administered 2023-07-17: 4 mg via INTRAVENOUS
  Filled 2023-07-17: qty 2

## 2023-07-17 MED ORDER — AMIODARONE HCL 200 MG PO TABS
200.0000 mg | ORAL_TABLET | Freq: Every day | ORAL | Status: DC
Start: 2023-07-17 — End: 2023-07-21
  Administered 2023-07-17 – 2023-07-20 (×4): 200 mg via ORAL
  Filled 2023-07-17 (×4): qty 1

## 2023-07-17 MED ORDER — FUROSEMIDE 10 MG/ML IJ SOLN
40.0000 mg | Freq: Two times a day (BID) | INTRAMUSCULAR | Status: DC
  Administered 2023-07-17 – 2023-07-18 (×2): 40 mg via INTRAVENOUS
  Filled 2023-07-17 (×2): qty 4

## 2023-07-17 MED ORDER — AMIODARONE HCL 200 MG PO TABS: 200.0000 mg | ORAL_TABLET | Freq: Every day | ORAL | Status: DC

## 2023-07-17 MED ORDER — SODIUM CHLORIDE 0.9 % IV BOLUS
1000.0000 mL | Freq: Once | INTRAVENOUS | Status: AC
  Administered 2023-07-17: 1000 mL via INTRAVENOUS

## 2023-07-17 MED ORDER — IMATINIB MESYLATE 400 MG PO TABS
400.0000 mg | ORAL_TABLET | Freq: Every day | ORAL | Status: DC
  Administered 2023-07-17: 400 mg via ORAL

## 2023-07-17 MED ORDER — ENOXAPARIN SODIUM 40 MG/0.4ML IJ SOSY
40.0000 mg | PREFILLED_SYRINGE | INTRAMUSCULAR | Status: DC
  Administered 2023-07-18 – 2023-07-21 (×4): 40 mg via SUBCUTANEOUS
  Filled 2023-07-17 (×4): qty 0.4

## 2023-07-17 MED ORDER — SODIUM CHLORIDE 0.9 % IV SOLN
2.0000 g | Freq: Once | INTRAVENOUS | Status: AC
  Administered 2023-07-17: 2 g via INTRAVENOUS
  Filled 2023-07-17: qty 12.5

## 2023-07-17 MED ORDER — ACETAMINOPHEN 650 MG RE SUPP: 650.0000 mg | Freq: Four times a day (QID) | RECTAL | Status: DC | PRN

## 2023-07-17 MED ORDER — ALBUTEROL SULFATE (2.5 MG/3ML) 0.083% IN NEBU: 2.5000 mg | INHALATION_SOLUTION | RESPIRATORY_TRACT | Status: DC | PRN

## 2023-07-17 MED ORDER — SODIUM CHLORIDE 0.9% FLUSH
3.0000 mL | Freq: Two times a day (BID) | INTRAVENOUS | Status: DC
  Administered 2023-07-17 – 2023-07-21 (×7): 3 mL via INTRAVENOUS

## 2023-07-17 NOTE — Progress Notes (Signed)
ED Pharmacy Antibiotic Sign Off An antibiotic consult was received from an ED provider for cefepime per pharmacy dosing for  wound infection . A chart review was completed to assess appropriateness.  The following one time order(s) were placed per pharmacy consult:  cefepime 2000 mg x 1 dose  Further antibiotic and/or antibiotic pharmacy consults should be ordered by the admitting provider if indicated.   Thank you for allowing pharmacy to be a part of this patient's care.   Delmar Landau, PharmD, BCPS 07/17/2023 12:29 PM ED Clinical Pharmacist -  [phone]

## 2023-07-17 NOTE — Plan of Care (Signed)
  Problem: Education: Goal: Knowledge of General Education information will improve Description: Including pain rating scale, medication(s)/side effects and non-pharmacologic comfort measures Outcome: Progressing   Problem: Health Behavior/Discharge Planning: Goal: Ability to manage health-related needs will improve Outcome: Progressing   Problem: Clinical Measurements: Goal: Ability to maintain clinical measurements within normal limits will improve Outcome: Progressing Goal: Will remain free from infection Outcome: Progressing Goal: Diagnostic test results will improve Outcome: Progressing Goal: Respiratory complications will improve Outcome: Progressing Goal: Cardiovascular complication will be avoided Outcome: Progressing   Problem: Activity: Goal: Risk for activity intolerance will decrease Outcome: Progressing   Problem: Nutrition: Goal: Adequate nutrition will be maintained Outcome: Progressing   Problem: Coping: Goal: Level of anxiety will decrease Outcome: Progressing   Problem: Elimination: Goal: Will not experience complications related to bowel motility Outcome: Progressing Goal: Will not experience complications related to urinary retention Outcome: Progressing   Problem: Pain Management: Goal: General experience of comfort will improve Outcome: Progressing   Problem: Safety: Goal: Ability to remain free from injury will improve Outcome: Progressing   Problem: Skin Integrity: Goal: Risk for impaired skin integrity will decrease Outcome: Progressing   Problem: Education: Goal: Ability to demonstrate management of disease process will improve Outcome: Progressing

## 2023-07-17 NOTE — ED Triage Notes (Signed)
Pt reports vomiting this morning, has had a wound to right lower leg since Saturday. Pt has been taking oral keflex and urgent care advised pt to come to ED for IV antibiotics because of positive would culture.

## 2023-07-17 NOTE — Progress Notes (Signed)
Pharmacy Antibiotic Note  Lisa [name] is a 82 y.o. female admitted on 07/17/2023 with cellulitis.  Pharmacy has been consulted for Cefepime dosing.  Plan: Cefepime 2g IV q12h Follow up renal function, cultures as available, clinical progress, length of tx Consider formal ID consult based on previous culture/sensitivity results  Height: 5\' 2"  (157.5 cm) Weight: 89.8 kg (198 lb) IBW/kg (Calculated) : 50.1  Temp (24hrs), Avg:98.8 F (37.1 C), Min:98.3 F (36.8 C), Max:99.4 F (37.4 C)  Recent Labs  Lab 07/17/23 1210 07/17/23 1219 07/17/23 1604  WBC 13.5*  --   --   CREATININE 1.37*  --   --   LATICACIDVEN  --  2.0* 0.9    Estimated Creatinine Clearance: 33.6 mL/min (A) (by C-G formula based on SCr of 1.37 mg/dL (H)).    Allergies  Allergen Reactions   Flagyl [Metronidazole] Itching, Rash and Other (See Comments)    Welts, also   Penicillins Hives, Itching and Rash    Has patient had a PCN reaction causing immediate rash, facial/tongue/throat swelling, SOB or lightheadedness with hypotension: Yes  Has patient had a PCN reaction causing severe rash involving mucus membranes or skin necrosis: Yes Has patient had a PCN reaction that required hospitalization No Has patient had a PCN reaction occurring within the last 10 years: NO If all of the above answers are "NO", then may proceed with Cephalosporin use.    Sulfamethoxazole Rash   Meperidine Nausea And Vomiting   Morphine And Codeine Nausea And Vomiting   Propoxyphene     Other reaction(s): Unknown   Tape Other (See Comments)    "THE PLASTIC, CLEAR TAPE CAN/DOES PULL OFF MY SKIN."   Doxycycline Nausea Only, Rash and Other (See Comments)    Made her "feel terrible"   Lanolin Itching and Rash   Sulfa Antibiotics Rash    Antimicrobials this admission: 12/7 Cefepime >>  Dose adjustments this admission:  Microbiology results: 12/7 BCx:  Thank you for allowing pharmacy to be a part of this patient's  care.  Loralee Pacas, PharmD, BCPS 07/17/2023 9:34 PM  Please check AMION for all [facility] Pharm[facility] phone numbers After 10:00 PM, call Main Pharmacy [phone]

## 2023-07-17 NOTE — ED Notes (Signed)
Pt requested labs be drawn with IV placement because she is a hard stick.

## 2023-07-17 NOTE — ED Notes (Signed)
ED TO INPATIENT HANDOFF REPORT  ED Nurse Name and Phone #: Lenell Antu Name/Age/Gender Lisa [name] 82 y.o. female Room/Bed: 031C/031C  Code Status   Code Status: Prior  Home/SNF/Other Home Patient oriented to: self, place, time, and situation Is this baseline? Yes   Triage Complete: Triage complete  Chief Complaint Cellulitis, leg [L03.119]  Triage Note Pt reports vomiting this morning, has had a wound to right lower leg since Saturday. Pt has been taking oral keflex and urgent care advised pt to come to ED for IV antibiotics because of positive would culture.   Allergies Allergies  Allergen Reactions   Flagyl [Metronidazole] Itching, Rash and Other (See Comments)    Welts, also   Penicillins Hives, Itching and Rash    Has patient had a PCN reaction causing immediate rash, facial/tongue/throat swelling, SOB or lightheadedness with hypotension: Yes  Has patient had a PCN reaction causing severe rash involving mucus membranes or skin necrosis: Yes Has patient had a PCN reaction that required hospitalization No Has patient had a PCN reaction occurring within the last 10 years: NO If all of the above answers are "NO", then may proceed with Cephalosporin use.    Sulfamethoxazole Rash   Meperidine Nausea And Vomiting   Morphine And Codeine Nausea And Vomiting   Propoxyphene     Other reaction(s): Unknown   Tape Other (See Comments)    "THE PLASTIC, CLEAR TAPE CAN/DOES PULL OFF MY SKIN."   Doxycycline Nausea Only, Rash and Other (See Comments)    Made her "feel terrible"   Lanolin Itching and Rash   Sulfa Antibiotics Rash    Level of Care/Admitting Diagnosis ED Disposition     ED Disposition  Admit   Condition  --   Comment  Hospital Area: [facility] [[id number]]  Level of Care: Telemetry Medical [104]  May admit patient to [facility] or [facility] if equivalent level of care is available:: No  Covid Evaluation: Asymptomatic - no recent  exposure (last 10 days) testing not required  Diagnosis: Cellulitis, leg [027253]  Admitting Physician: Clydie Braun [[id number]]  Attending Physician: Clydie Braun [[id number]]  Certification:: I certify this patient will need inpatient services for at least 2 midnights  Expected Medical Readiness: 07/19/2023          B Medical/Surgery History Past Medical History:  Diagnosis Date   CAD in native artery 2017   stent to LAD   Cardiac arrest (HCC) 09/2015   CHF (congestive heart failure) (HCC)    CML (chronic myelocytic leukemia) (HCC)    Complication of anesthesia    Hx: UTI (urinary tract infection)    Hypertension    Melanoma (HCC)    PONV (postoperative nausea and vomiting)    PVC (premature ventricular contraction)    HISTORY OF   Thyroid disease    V tach (HCC)    Past Surgical History:  Procedure Laterality Date   ABDOMINAL HYSTERECTOMY     APPENDECTOMY     CARDIAC CATHETERIZATION N/A 10/03/2015   Procedure: Left Heart Cath and Coronary Angiography;  Surgeon: Peter M Swaziland, MD;  Location: [facility] INVASIVE CV LAB;  Service: Cardiovascular;  Laterality: N/A;   CARDIAC CATHETERIZATION N/A 10/07/2015   Procedure: Coronary Stent Intervention;  Surgeon: Kathleene Hazel, MD;  Location: [facility] INVASIVE CV LAB;  Service: Cardiovascular;  Laterality: N/A;   CARDIAC CATHETERIZATION  09/22/2019   EP IMPLANTABLE DEVICE N/A 01/20/2016   Procedure: BiV ICD Insertion CRT-D;  Surgeon:  Marinus Maw, MD;  Location: [facility] INVASIVE CV LAB;  Service: Cardiovascular;  Laterality: N/A;   LEFT HEART CATH AND CORONARY ANGIOGRAPHY N/A 09/22/2019   Procedure: LEFT HEART CATH AND CORONARY ANGIOGRAPHY;  Surgeon: Swaziland, Peter M, MD;  Location: [facility] INVASIVE CV LAB;  Service: Cardiovascular;  Laterality: N/A;   TONSILLECTOMY     TONSILLECTOMY     TUBAL LIGATION       A IV Location/Drains/Wounds Patient Lines/Drains/Airways Status     Active Line/Drains/Airways     Name Placement date Placement  time Site Days   Peripheral IV 07/17/23 22 G Right Antecubital 07/17/23  1152  Antecubital  less than 1   Wound / Incision (Open or Dehisced) 05/22/22 Other (Comment) Pretibial Distal;Left Lymphedema, skin blistered, cracked, red, weeping 05/22/22  0522  Pretibial  421   Wound / Incision (Open or Dehisced) 05/22/22 Other (Comment) Pretibial Distal;Right Lymphedema, blistered, weeping, indented 05/22/22  0523  Pretibial  421            Intake/Output Last 24 hours No intake or output data in the 24 hours ending 07/17/23 1516  Labs/Imaging Results for orders placed or performed during the hospital encounter of 07/17/23 (from the past 48 hour(s))  Lipase, blood     Status: None   Collection Time: 07/17/23 12:10 PM  Result Value Ref Range   Lipase 29 11 - 51 U/L    Comment: Performed at [facility], [address], [place], [place] [address]  Comprehensive metabolic panel     Status: Abnormal   Collection Time: 07/17/23 12:10 PM  Result Value Ref Range   Sodium 140 135 - 145 mmol/L   Potassium 3.9 3.5 - 5.1 mmol/L   Chloride 108 98 - 111 mmol/L   CO2 18 (L) 22 - 32 mmol/L   Glucose, Bld 120 (H) 70 - 99 mg/dL    Comment: Glucose reference range applies only to samples taken after fasting for at least 8 hours.   BUN 17 8 - 23 mg/dL   Creatinine, Ser 4.13 (H) 0.44 - 1.00 mg/dL   Calcium 8.7 (L) 8.9 - 10.3 mg/dL   Total Protein 6.6 6.5 - 8.1 g/dL   Albumin 3.4 (L) 3.5 - 5.0 g/dL   AST 23 15 - 41 U/L   ALT 17 0 - 44 U/L   Alkaline Phosphatase 65 38 - 126 U/L   Total Bilirubin 1.2 (H) <1.2 mg/dL   GFR, Estimated 39 (L) >60 mL/min    Comment: (NOTE) Calculated using the CKD-EPI Creatinine Equation (2021)    Anion gap 14 5 - 15    Comment: Performed at [facility], [address], [place], [place] [address]  CBC     Status: Abnormal   Collection Time: 07/17/23 12:10 PM  Result Value Ref Range   WBC 13.5 (H) 4.0 - 10.5 K/uL   RBC 2.43 (L) 3.87 - 5.11 MIL/uL    Hemoglobin 7.7 (L) 12.0 - 15.0 g/dL   HCT 02.7 (L) 25.3 - 66.4 %   MCV 102.1 (H) 80.0 - 100.0 fL   MCH 31.7 26.0 - 34.0 pg   MCHC 31.0 30.0 - 36.0 g/dL   RDW 40.3 47.4 - 25.9 %   Platelets 213 150 - 400 K/uL   nRBC 0.0 0.0 - 0.2 %    Comment: Performed at [facility], [address] [place], [place] [address]  Troponin I (High Sensitivity)     Status: Abnormal  Collection Time: 07/17/23 12:10 PM  Result Value Ref Range   Troponin I (High Sensitivity) 26 (H) <18 ng/L    Comment: (NOTE) Elevated high sensitivity troponin I (hsTnI) values and significant  changes across serial measurements may suggest ACS but many other  chronic and acute conditions are known to elevate hsTnI results.  Refer to the "Links" section for chest pain algorithms and additional  guidance. Performed at [facility], [address], [place], [place] [address]   Brain natriuretic peptide     Status: Abnormal   Collection Time: 07/17/23 12:10 PM  Result Value Ref Range   B Natriuretic Peptide 803.7 (H) 0.0 - 100.0 pg/mL    Comment: Performed at [facility], [address] [place], [place] [address]  I-Stat CG4 Lactic Acid     Status: Abnormal   Collection Time: 07/17/23 12:19 PM  Result Value Ref Range   Lactic Acid, Venous 2.0 (HH) 0.5 - 1.9 mmol/L   Comment NOTIFIED PHYSICIAN    US Abdomen Limited RUQ (LIVER/GB)  Result Date: 07/17/2023 CLINICAL DATA:  Right upper quadrant pain EXAM: ULTRASOUND ABDOMEN LIMITED RIGHT UPPER QUADRANT COMPARISON:  None Available. FINDINGS: Gallbladder: No gallstones or wall thickening visualized. No sonographic Murphy sign noted by sonographer. Common bile duct: Diameter: 2.8 mm Liver: No focal liver abnormality. Increased parenchymal echogenicity of the liver. Portal vein is patent on color Doppler imaging with normal direction of blood flow towards the liver. Other: None. IMPRESSION: 1. No acute findings. 2. Increased parenchymal echogenicity of the liver  is a nonspecific finding but is most commonly seen with fatty infiltration of the liver. Electronically Signed   By: Signa Kell M.D.   On: 07/17/2023 13:20   DG Chest Portable 1 View  Result Date: 07/17/2023 CLINICAL DATA:  Shortness of breath. EXAM: PORTABLE CHEST 1 VIEW COMPARISON:  06/16/2022 FINDINGS: AP portable radiograph.  Pacer/ICD again identified. Numerous leads and wires project over the chest. Midline trachea. Moderate to marked cardiomegaly. No pneumothorax. Opacity throughout the inferior left hemithorax likely represents a large left pleural effusion with lower lung predominant airspace disease. Underlying mild interstitial prominence and indistinctness. Clear right lung. IMPRESSION: Large left pleural effusion with adjacent airspace disease, most likely atelectasis. Cannot exclude infection or aspiration. Cardiomegaly and mild congestive heart failure. Aortic Atherosclerosis (ICD10-I70.0). Electronically Signed   By: Jeronimo Greaves M.D.   On: 07/17/2023 13:13    Pending Labs Unresulted Labs (From admission, onward)     Start     Ordered   07/17/23 1152  Blood culture (routine x 2)  BLOOD CULTURE X 2,   R (with STAT occurrences)      07/17/23 1151   07/17/23 1031  Urinalysis, Routine w reflex microscopic -Urine, Clean Catch  Once,   URGENT       Question:  Specimen Source  Answer:  Urine, Clean Catch   07/17/23 1030            Vitals/Pain Today's Vitals   07/17/23 1430 07/17/23 1445 07/17/23 1500 07/17/23 1516  BP: 136/60 (!) 139/53 (!) 130/58   Pulse: 79 78 77   Resp: (!) 23 (!) 22 20   Temp:    98.8 F (37.1 C)  TempSrc:    Oral  SpO2: 100% 100% 100%   Weight:      Height:      PainSc:        Isolation Precautions No active isolations  Medications Medications  furosemide (LASIX) injection 40  mg (has no administration in time range)  ondansetron (ZOFRAN) injection 4 mg (4 mg Intravenous Given 07/17/23 1229)  ceFEPIme (MAXIPIME) 2 g in sodium chloride 0.9 %  100 mL IVPB (0 g Intravenous Stopped 07/17/23 1515)  sodium chloride 0.9 % bolus 1,000 mL (0 mLs Intravenous Stopped 07/17/23 1515)    Mobility walks with device     Focused Assessments     R Recommendations: See Admitting Provider Note  Report given to:   Additional Notes:

## 2023-07-17 NOTE — Progress Notes (Signed)
VASCULAR LAB    :Bilateral lower extremity venous duplex has been performed.  See CV proc for preliminary results.  Messaged negative results to Claudette Stapler, PA-C via secure chat  Jabar Krysiak, RVT 07/17/2023, 2:16 PM

## 2023-07-17 NOTE — Progress Notes (Addendum)
  Patient reported that he takes imatinib, amiodarone and Lopressor at home at the nighttime.   - Per chart review of the oncology note patient has history of chronic myeloid leukemia on chronic phase currently on imatinib 400 mg at bedtime which I have resumed. -Per chart review of the cardiology note patient has history of proximal mitral fibrillation and ventricular tachycardia status post biventricular ICD placement.  Cardiology recommended to continue the amiodarone chart review of the note from 08/2022.   Resumed amiodarone 200 mg at nighttime.   Tereasa Coop, MD [facility] 07/17/2023, 10:13 PM

## 2023-07-17 NOTE — H&P (Signed)
History and Physical    Patient: Lisa [name] OZH:[id number] DOB: 27-Sep-1940 DOA: 07/17/2023 DOS: the patient was seen and examined on 07/17/2023 PCP: Eartha Inch, MD  Patient coming from: Home  Chief Complaint:  Chief Complaint  Patient presents with   Emesis   Wound Infection   HPI: Lisa [name] is a 82 y.o. female with medical history significant of hypertension, CAD, heart failure with reduced ejection fraction, CML, melanoma presents for worsening wound on her right leg several days after Thanksgiving, the affected area became red, progressively worsening. On Tuesday, the patient visited urgent care where a wound nurse applied a yellow substance, wrapped the leg, and started the patient on Keflex. A culture was taken, which later revealed pseudomonas resistant to multiple antibiotics. Due to the patient's history of stent and defibrillator placement, IV antibiotics were recommended.  The patient reports increased shortness of breath and experiencing chills this morning, accompanied by vomiting. The vomitus was described as white foam. The patient also mentions constant clear drainage from sinuses, causing postnasal drip that sometimes triggers vomiting.   She speculates that the wound may have resulted from pushing too hard while applying wraps she does for her lymphedema, possibly causing the skin to crack like she had melanoma previously in the 1970s which was removed and skin graft was placed.  In the emergency department patient was noted to be afebrile with mild tachypnea, blood pressures 130/58 to 177/81, and O2 saturations currently maintained on room air.  Labs significant for WBC 13.5, hemoglobin 7.7,BUN 17, creatinine 1.73, BNP 803.7, high-sensitivity troponin 26, and lactic acid 2.  Doppler ultrasound of the bilateral lower extremities did not show any signs for a DVT.  Blood cultures were obtained.  Patient was started on empiric antibiotics of cefepime and had  been given 1 L bolus of IV fluids  Review of Systems: As mentioned in the history of present illness. All other systems reviewed and are negative. Past Medical History:  Diagnosis Date   CAD in native artery 2017   stent to LAD   Cardiac arrest (HCC) 09/2015   CHF (congestive heart failure) (HCC)    CML (chronic myelocytic leukemia) (HCC)    Complication of anesthesia    Hx: UTI (urinary tract infection)    Hypertension    Melanoma (HCC)    PONV (postoperative nausea and vomiting)    PVC (premature ventricular contraction)    HISTORY OF   Thyroid disease    V tach (HCC)    Past Surgical History:  Procedure Laterality Date   ABDOMINAL HYSTERECTOMY     APPENDECTOMY     CARDIAC CATHETERIZATION N/A 10/03/2015   Procedure: Left Heart Cath and Coronary Angiography;  Surgeon: Peter M Swaziland, MD;  Location: [facility] INVASIVE CV LAB;  Service: Cardiovascular;  Laterality: N/A;   CARDIAC CATHETERIZATION N/A 10/07/2015   Procedure: Coronary Stent Intervention;  Surgeon: Kathleene Hazel, MD;  Location: [facility] INVASIVE CV LAB;  Service: Cardiovascular;  Laterality: N/A;   CARDIAC CATHETERIZATION  09/22/2019   EP IMPLANTABLE DEVICE N/A 01/20/2016   Procedure: BiV ICD Insertion CRT-D;  Surgeon: Marinus Maw, MD;  Location: [facility] INVASIVE CV LAB;  Service: Cardiovascular;  Laterality: N/A;   LEFT HEART CATH AND CORONARY ANGIOGRAPHY N/A 09/22/2019   Procedure: LEFT HEART CATH AND CORONARY ANGIOGRAPHY;  Surgeon: Swaziland, Peter M, MD;  Location: [facility] INVASIVE CV LAB;  Service: Cardiovascular;  Laterality: N/A;   TONSILLECTOMY     TONSILLECTOMY     TUBAL  LIGATION     Social History:  reports that she has never smoked. She has never used smokeless tobacco. She reports that she does not drink alcohol and does not use drugs.  Allergies  Allergen Reactions   Flagyl [Metronidazole] Itching, Rash and Other (See Comments)    Welts, also   Penicillins Hives, Itching and Rash    Has patient had a PCN reaction  causing immediate rash, facial/tongue/throat swelling, SOB or lightheadedness with hypotension: Yes  Has patient had a PCN reaction causing severe rash involving mucus membranes or skin necrosis: Yes Has patient had a PCN reaction that required hospitalization No Has patient had a PCN reaction occurring within the last 10 years: NO If all of the above answers are "NO", then may proceed with Cephalosporin use.    Sulfamethoxazole Rash   Meperidine Nausea And Vomiting   Morphine And Codeine Nausea And Vomiting   Propoxyphene     Other reaction(s): Unknown   Tape Other (See Comments)    "THE PLASTIC, CLEAR TAPE CAN/DOES PULL OFF MY SKIN."   Doxycycline Nausea Only, Rash and Other (See Comments)    Made her "feel terrible"   Lanolin Itching and Rash   Sulfa Antibiotics Rash    Family History  Problem Relation Age of Onset   Coronary artery disease Mother    Thyroid disease Mother    Multiple myeloma Father    Heart attack Maternal Grandfather    Heart attack Paternal Grandfather     Prior to Admission medications   Medication Sig Start Date End Date Taking? Authorizing Provider  acetaminophen (TYLENOL) 500 MG tablet Take 500-1,000 mg by mouth every 6 (six) hours as needed for moderate pain.   Yes [provider]  allopurinol (ZYLOPRIM) 100 MG tablet Take 100 mg by mouth daily.  06/15/17  Yes [provider]  aluminum hydroxide-magnesium carbonate (GAVISCON) 95-358 MG/15ML SUSP Take 30 mLs by mouth as needed for indigestion or heartburn.   Yes [provider]  amiodarone (PACERONE) 200 MG tablet TAKE ONE TABLET BY MOUTH ONE TIME DAILY 07/28/22  Yes Marinus Maw, MD  aspirin EC 81 MG tablet Take 81 mg by mouth in the morning.   Yes [provider]  atorvastatin (LIPITOR) 20 MG tablet Take 1 tablet (20 mg total) by mouth daily. 05/07/16  Yes Bensimhon, Bevelyn Buckles, MD  b complex vitamins capsule Take 1 capsule by mouth daily.   Yes [provider]  calcium carbonate (OS-CAL - DOSED IN MG OF ELEMENTAL CALCIUM) 1250 (500 Ca) MG tablet Take 1 tablet by mouth daily as needed (Calcium levels).   Yes [provider]  Cholecalciferol (VITAMIN D-3 PO) Take 5,000 Units by mouth daily.   Yes [provider]  Cyanocobalamin (VITAMIN B12 SL) Place 1 tablet under the tongue daily.   Yes [provider]  imatinib (GLEEVEC) 400 MG tablet Take 400 mg by mouth daily. 02/16/17  Yes [provider]  loperamide (IMODIUM) 2 MG capsule Take 2 mg by mouth as needed for diarrhea or loose stools.   Yes [provider]  MAGNESIUM GLYCINATE PO Take 1 tablet by mouth daily.   Yes [provider]  metoprolol succinate (TOPROL-XL) 25 MG 24 hr tablet Take one-half tablet by mouth daily 06/03/23  Yes Tillery, Mariam Dollar, PA-C  Multiple Vitamin (MULTIVITAMIN WITH MINERALS) TABS tablet Take 1 tablet by mouth daily.   Yes [provider]  nitroGLYCERIN (NITROSTAT) 0.4 MG SL tablet Place 1 tablet (  0.4 mg total) under the tongue every 5 (five) minutes x 3 doses as needed for chest pain. 12/24/20  Yes Pricilla Riffle, MD  potassium chloride (KLOR-CON) 10 MEQ tablet Take 1 tablet (10 mEq total) by mouth daily. Patient taking differently: Take 20 mEq by mouth daily. 06/26/22  Yes Pricilla Riffle, MD  thyroid (ARMOUR) 15 MG tablet Take 15 mg by mouth daily before breakfast.    Yes [provider]  torsemide (DEMADEX) 20 MG tablet Take 1 tablet (20 mg total) by mouth as directed. Patient taking differently: Take 20 mg by mouth daily. 06/01/23  Yes Marinus Maw, MD  vitamin C (ASCORBIC ACID) 500 MG tablet Take 500 mg by mouth daily.   Yes [provider]  cephALEXin (KEFLEX) 500 MG capsule Take 500 mg by mouth 3 (three) times daily. Patient not taking: Reported on 07/17/2023 07/13/23 07/20/23  [provider]  timolol (TIMOPTIC) 0.5 % ophthalmic solution Place 1 drop into the left eye 2 (two)  times daily. Patient not taking: Reported on 07/17/2023 07/15/23   [provider]    Physical Exam: Vitals:   07/17/23 1027 07/17/23 1029 07/17/23 1429  BP: (!) 177/81  (!) 138/52  Pulse: 94  78  Resp: 19  (!) 22  Temp: 98.3 F (36.8 C)    SpO2: 93%  99%  Weight:  89.8 kg   Height:  5\' 2"  (1.575 m)    Constitutional: Obese elderly female currently in NAD, calm, comfortable Eyes: PERRL, lids and conjunctivae normal ENMT: Mucous membranes are moist. Posterior pharynx clear of any exudate or lesions.  Neck: normal, supple, no masses, no thyromegaly Respiratory: Decreased overall aeration with some crackles heard in the mid to lower lung fields.  Patient currently on 2 L Cardiovascular: Regular rate and rhythm, no murmurs / rubs / gallops. No extremity edema. 2+ pedal pulses. No carotid bruits.  Abdomen: no tenderness, no masses palpated. No hepatosplenomegaly. Bowel sounds positive.  Musculoskeletal: no clubbing / cyanosis.  Deformity noted of the right shin Skin: Significant erythema with deficit of the right leg present for patient previously had skin grafting for history of melanoma Neurologic: CN 2-12 grossly intact. Sensation intact, DTR normal. Strength 5/5 in all 4.  Psychiatric: Normal judgment and insight. Alert and oriented x 3. Normal mood.   Data Reviewed:  reviewed labs, imaging, and pertinent records as documented  Assessment and Plan:  Cellulitis Right lower extremity wound With edema Acute.  Patient thinks that she developed a wound when she was wrapping her legs over the area where she previously had melanoma removed several years ago and subsequently had a skin graft placed.  Thereafter patient reported having increased redness and erythema of the right leg.  She had been placed on Keflex after being seen in urgent care, but wound cultures (available in Care Everywhere records) grew out multidrug-resistant Pseudomonas for which she was recommended to to the  hospital for IV antibiotics.  Blood cultures were obtained.  Patient was started on empiric antibiotics cefepime. -Admit to a telemetry bed -Check ESR, CRP, hemoglobin A1c, and prealbumin -Continue empiric antibiotics of cefepime -Consider need of formal consult to ID in a.m. -Wound care consulted  Respiratory distress Pleural effusion Heart failure with reduced ejection fraction Acute on chronic.  Patient reported having increased shortness of breath. Chest x-ray concerning for large left pleural effusion with adjacent atelectasis.  BNP elevated 803.7.  She had been given 1 L in the ED due to initial  concern for sepsis..  Last echocardiogram noted EF to be 45 to 50% in 2023. -Continue nasal cannula oxygen as tolerated. 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  Patient had not reported any complaints of bleeding.  -Recheck H&H in a.m. -Continue outpatient follow-up with oncology at [facility]  Chronic kidney disease stage IIIb On admission creatinine 1.37, baseline creatinine appears around 1.1-1.4. -Continue to monitor kidney function with diuresis  Hypothyroidism -Check TSH -Continue Armour Thyroid  Obesity BMI 36.21 kg/m  DVT prophylaxis: Advance Care Planning:   Code Status: Full Code   Consults: none  Family Communication: none  Severity of Illness: The appropriate patient status for this patient is INPATIENT. Inpatient status is judged to be reasonable and necessary in order to provide the required intensity of service to ensure the patient's safety. The patient's presenting symptoms, physical exam findings, and initial radiographic and laboratory data in the context of their chronic comorbidities is felt to place them at high risk for further clinical deterioration. Furthermore, it is not anticipated that the patient will be medically stable for discharge from the hospital  within 2 midnights of admission.   * I certify that at the point of admission it is my clinical judgment that the patient will require inpatient hospital care spanning beyond 2 midnights from the point of admission due to high intensity of service, high risk for further deterioration and high frequency of surveillance required.*  Author: Clydie Braun, MD 07/17/2023 3:09 PM  For on call review [email or web address]. Skin graft

## 2023-07-17 NOTE — ED Notes (Signed)
Patient transported to Ultrasound 

## 2023-07-17 NOTE — ED Provider Notes (Signed)
[place] [facility] AT [facility] Provider Note   CSN: [id number] Arrival date & time: 07/17/23  1014     History  Chief Complaint  Patient presents with   Emesis   Wound Infection    Lisa [name] is a 82 y.o. 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   She reports the wound infection started last Saturday when she started to have clear drainage from the R lower leg, but didn't truly realize it until Sunday or Monday. She had Aquacel and wound wrapping at home she used to dress the wound at that time. On Tuesday, she went to urgent care where they did a wound culture, re-wrapped the wound and gave her a prescription for Keflex. She then received a call last night that the wound culture grew Psuedomonas and she needed to go to the ED for IV antibiotics.   She started to have nausea this morning where she has been spitting up/'"vomiting" white foam. Denies any abdominal pain, hemoptysis, fever. Denies diarrhea.  She also reports significant SOB that started 3 days ago. Denies chest pain or cough. She normally ambulates at home with assistance from a cane and ambulates with assistance from a walker when she leaves the house. No history of blood clots. History of CHF. No history of COPD.   History obtained from patient and past medical records. No interpreter used during encounter.       Home Medications Prior to Admission medications   Medication Sig Start Date End Date Taking? Authorizing Provider  acetaminophen (TYLENOL) 500 MG tablet Take 500-1,000 mg by mouth every 6 (six) hours as needed for moderate pain.   Yes [provider]  allopurinol (ZYLOPRIM) 100 MG tablet Take 100 mg by mouth daily.  06/15/17  Yes  [provider]  aluminum hydroxide-magnesium carbonate (GAVISCON) 95-358 MG/15ML SUSP Take 30 mLs by mouth as needed for indigestion or heartburn.   Yes [provider]  amiodarone (PACERONE) 200 MG tablet TAKE ONE TABLET BY MOUTH ONE TIME DAILY 07/28/22  Yes Marinus Maw, MD  aspirin EC 81 MG tablet Take 81 mg by mouth in the morning.   Yes [provider]  atorvastatin (LIPITOR) 20 MG tablet Take 1 tablet (20 mg total) by mouth daily. 05/07/16  Yes Bensimhon, Bevelyn Buckles, MD  b complex vitamins capsule Take 1 capsule by mouth daily.   Yes [provider]  calcium carbonate (OS-CAL - DOSED IN MG OF ELEMENTAL CALCIUM) 1250 (500 Ca) MG tablet Take 1 tablet by mouth daily as needed (Calcium levels).   Yes [provider]  cephALEXin (KEFLEX) 500 MG capsule Take 500 mg by mouth 3 (three) times daily. 07/13/23 07/20/23 Yes [provider]  Cholecalciferol (VITAMIN D-3 PO) Take 5,000 Units by mouth daily.   Yes [provider]  Cyanocobalamin (VITAMIN B12 SL) Place 1 tablet under the tongue daily.   Yes [provider]  imatinib (GLEEVEC) 400 MG tablet Take 400 mg by mouth daily. 02/16/17  Yes [provider]  loperamide (IMODIUM) 2 MG capsule Take 2 mg by mouth as needed for diarrhea or loose stools.   Yes [provider]  metoprolol succinate (TOPROL-XL) 25 MG 24 hr tablet Take one-half tablet by mouth daily 06/03/23  Yes  Graciella Freer, PA-C  Multiple Vitamin (MULTIVITAMIN WITH MINERALS) TABS tablet Take 1 tablet by mouth daily.   Yes [provider]  nitroGLYCERIN (NITROSTAT) 0.4 MG SL tablet Place 1 tablet (0.4 mg total) under the tongue every 5 (five) minutes x 3 doses as needed for chest pain. 12/24/20  Yes Pricilla Riffle, MD  potassium chloride (KLOR-CON) 10 MEQ tablet Take 1 tablet (10 mEq total) by mouth daily. Patient taking differently: Take 20 mEq by mouth daily. 06/26/22  Yes Pricilla Riffle, MD  thyroid (ARMOUR) 15 MG tablet Take 15 mg by mouth daily before breakfast.    Yes [provider]  timolol (TIMOPTIC) 0.5 % ophthalmic solution Place 1 drop into the left eye 2 (two) times daily. 07/15/23  Yes [provider]  torsemide (DEMADEX) 20 MG tablet Take 1 tablet (20 mg total) by mouth as directed. Patient taking differently: Take 20 mg by mouth daily. 06/01/23  Yes Marinus Maw, MD  vitamin C (ASCORBIC ACID) 500 MG tablet Take 500 mg by mouth daily.   Yes [provider]      Allergies    Flagyl [metronidazole], Penicillins, Sulfamethoxazole, Meperidine, Morphine and codeine, Propoxyphene, Tape, Doxycycline, Lanolin, and Sulfa antibiotics    Review of Systems   Review of Systems  Constitutional:  Positive for chills. Negative for fever.  Respiratory:  Positive for cough and shortness of breath.   Cardiovascular:  Positive for leg swelling. Negative for chest pain.  Gastrointestinal:  Positive for abdominal pain, nausea and vomiting. Negative for diarrhea.    Physical Exam Updated Vital Signs BP (!) 138/52   Pulse 78   Temp 98.3 F (36.8 C)   Resp (!) 22   Ht 5\' 2"  (1.575 m)   Wt 89.8 kg   LMP 02/14/1995   SpO2 99%   BMI 36.21 kg/m  Physical Exam Vitals and nursing note reviewed.  Constitutional:      General: She is not in acute distress.    Appearance: She is not ill-appearing.  HENT:     Head: Normocephalic.  Eyes:     Pupils: Pupils are equal, round, and reactive to light.  Cardiovascular:     Rate and Rhythm: Normal rate and regular rhythm.     Pulses: Normal pulses.     Heart sounds: Normal heart sounds. No murmur heard.    No friction rub. No gallop.  Pulmonary:     Effort: Pulmonary effort is normal.     Breath sounds: Normal breath sounds.  Abdominal:     General: Abdomen is flat. There is no distension.     Palpations: Abdomen is soft.     Tenderness: There is abdominal tenderness. There is no guarding or  rebound.     Comments: RUQ tenderness  Musculoskeletal:        General: Normal range of motion.     Cervical back: Neck supple.     Comments: Bilateral lower extremity edema. Wound to RLE. See photo below.  Skin:    General: Skin is warm and dry.  Neurological:     General: No focal deficit present.     Mental Status: She is alert.  Psychiatric:        Mood and Affect: Mood normal.        Behavior: Behavior normal.     ED Results / Procedures / Treatments   Labs (all labs ordered are listed, but only abnormal results are displayed) Labs Reviewed  COMPREHENSIVE METABOLIC  PANEL - Abnormal; Notable for the following components:      Result Value   CO2 18 (*)    Glucose, Bld 120 (*)    Creatinine, Ser 1.37 (*)    Calcium 8.7 (*)    Albumin 3.4 (*)    Total Bilirubin 1.2 (*)    GFR, Estimated 39 (*)    All other components within normal limits  CBC - Abnormal; Notable for the following components:   WBC 13.5 (*)    RBC 2.43 (*)    Hemoglobin 7.7 (*)    HCT 24.8 (*)    MCV 102.1 (*)    All other components within normal limits  BRAIN NATRIURETIC PEPTIDE - Abnormal; Notable for the following components:   B Natriuretic Peptide 803.7 (*)    All other components within normal limits  I-STAT CG4 LACTIC ACID, ED - Abnormal; Notable for the following components:   Lactic Acid, Venous 2.0 (*)    All other components within normal limits  TROPONIN I (HIGH SENSITIVITY) - Abnormal; Notable for the following components:   Troponin I (High Sensitivity) 26 (*)    All other components within normal limits  CULTURE, BLOOD (ROUTINE X 2)  CULTURE, BLOOD (ROUTINE X 2)  LIPASE, BLOOD  URINALYSIS, ROUTINE W REFLEX MICROSCOPIC  I-STAT CG4 LACTIC ACID, ED  TROPONIN I (HIGH SENSITIVITY)    EKG None  Radiology US Abdomen Limited RUQ (LIVER/GB)  Result Date: 07/17/2023 CLINICAL DATA:  Right upper quadrant pain EXAM: ULTRASOUND ABDOMEN LIMITED RIGHT UPPER QUADRANT COMPARISON:  None  Available. FINDINGS: Gallbladder: No gallstones or wall thickening visualized. No sonographic Murphy sign noted by sonographer. Common bile duct: Diameter: 2.8 mm Liver: No focal liver abnormality. Increased parenchymal echogenicity of the liver. Portal vein is patent on color Doppler imaging with normal direction of blood flow towards the liver. Other: None. IMPRESSION: 1. No acute findings. 2. Increased parenchymal echogenicity of the liver is a nonspecific finding but is most commonly seen with fatty infiltration of the liver. Electronically Signed   By: Signa Kell M.D.   On: 07/17/2023 13:20   DG Chest Portable 1 View  Result Date: 07/17/2023 CLINICAL DATA:  Shortness of breath. EXAM: PORTABLE CHEST 1 VIEW COMPARISON:  06/16/2022 FINDINGS: AP portable radiograph.  Pacer/ICD again identified. Numerous leads and wires project over the chest. Midline trachea. Moderate to marked cardiomegaly. No pneumothorax. Opacity throughout the inferior left hemithorax likely represents a large left pleural effusion with lower lung predominant airspace disease. Underlying mild interstitial prominence and indistinctness. Clear right lung. IMPRESSION: Large left pleural effusion with adjacent airspace disease, most likely atelectasis. Cannot exclude infection or aspiration. Cardiomegaly and mild congestive heart failure. Aortic Atherosclerosis (ICD10-I70.0). Electronically Signed   By: Jeronimo Greaves M.D.   On: 07/17/2023 13:13    Procedures Procedures    Medications Ordered in ED Medications  ondansetron (ZOFRAN) injection 4 mg (4 mg Intravenous Given 07/17/23 1229)  ceFEPIme (MAXIPIME) 2 g in sodium chloride 0.9 % 100 mL IVPB (2 g Intravenous New Bag/Given 07/17/23 1414)  sodium chloride 0.9 % bolus 1,000 mL (1,000 mLs Intravenous New Bag/Given 07/17/23 1408)    ED Course/ Medical Decision Making/ A&P Clinical Course as of 07/17/23 1459  Sat Jul 17, 2023  1301 WBC(!): 13.5 [CA]    Clinical Course User  Index [CA] Mannie Stabile, PA-C  Medical Decision Making Amount and/or Complexity of Data Reviewed External Data Reviewed: labs and notes.    Details: UC note/wound culture Labs: ordered. Decision-making details documented in ED Course. Radiology: ordered and independent interpretation performed. Decision-making details documented in ED Course. ECG/medicine tests: ordered and independent interpretation performed. Decision-making details documented in ED Course.  Risk Prescription drug management. Decision regarding hospitalization.   This patient presents to the ED for concern of N/V/SOB/Wound, this involves an extensive number of treatment options, and is a complaint that carries with it a high risk of complications and morbidity.  The differential diagnosis includes sepsis, cellulitis, abscess, DVT, etc  82 year old female presents to the ED due to multiple complaints of right lower extremity wound, nausea, vomiting, and shortness of breath.  Seen at urgent care a few days ago where wound culture was positive for multidrug-resistant Pseudomonas and was advised to report to the ED for IV antibiotics.  Patient also admits to nausea and vomiting that started earlier today.  Shortness of breath for the past few days.  No associated chest pain.  History of CHF.  Upon arrival patient afebrile, not tachycardic or hypoxic.  Patient in no acute distress.  Abdomen soft, nondistended with right upper quadrant tenderness.  Ultrasound ordered to rule out evidence of acute cholecystitis.  Bilateral lower extremity edema.  Wound to right lower extremity.  See photo above.  Patient started on IV cefepime per wound culture.  Routine labs ordered.  BNP to rule out CHF exacerbation.  Chest x-ray to rule out evidence of pneumonia.  Will add troponin however, presentation is atypical for ACS.  Patient will likely require admission for IV antibiotics for right lower extremity  wound. [place] to rule out DVT.  CBC significant for leukocytosis at 13.5.  Hemoglobin at 7.7.  Patient denies any hematemesis, melena, or hematochezia.  CMP with elevated creatinine at 1.37.  Hyperglycemia 120.  No anion gap.  Lipase normal.  Low suspicion for pancreatitis.  Chest x-ray personally reviewed and interpreted which demonstrates large left pleural effusion which cannot exclude infection.  Patient already placed on cefepime for left lower extremity cellulitis which will cover for pneumonia as well.  Ultrasound negative for DVT.  Right upper quadrant ultrasound negative for acute cholecystitis or gallstones. Troponin elevated at 26. EKG paced rhythm. Low suspicion for ACS. Suspect troponin elevated due to demand ischemia. BNP elevated at 803.   1:29 PM discussed with pharmacy who notes cefepime would also cover for community-acquired pneumonia.  Patient will require admission for large pleural effusion and lower extremity cellulitis.  IV antibiotics started.  Patient on room air.  No evidence of respiratory distress. Discussed with Dr. Jacqulyn Bath who agrees with assessment and plan.  Co morbidities that complicate the patient evaluation  Lymphedema, CHF, CML Cardiac Monitoring: / EKG:  The patient was maintained on a cardiac monitor.  I personally viewed and interpreted the cardiac monitored which showed an underlying rhythm of: paced rhythm  Social Determinants of Health:  Elderly patient >65 year  Test / Admission - Considered:  CTA chest; however PE thought to be less likely. Suspect SOB related to large pleural effusion on CXR  3:12 PM Discussed with Dr. Katrinka Blazing with [facility] who agrees to admit patient        Final Clinical Impression(s) / ED Diagnoses Final diagnoses:  Cellulitis of right lower extremity  Pleural effusion    Rx / DC Orders ED Discharge Orders     None  Mannie Stabile, PA-C 07/17/23 1517    Maia Plan, MD 07/20/23 [phone]

## 2023-07-18 ENCOUNTER — Other Ambulatory Visit (HOSPITAL_COMMUNITY): Payer: Medicare Other

## 2023-07-18 DIAGNOSIS — S81801A Unspecified open wound, right lower leg, initial encounter: Secondary | ICD-10-CM | POA: Diagnosis not present

## 2023-07-18 DIAGNOSIS — I5033 Acute on chronic diastolic (congestive) heart failure: Secondary | ICD-10-CM

## 2023-07-18 LAB — ECHOCARDIOGRAM COMPLETE
AR max vel: 1.64 cm2
AV Area VTI: 1.59 cm2
AV Area mean vel: 1.67 cm2
AV Mean grad: 11 mm[Hg]
AV Peak grad: 18.3 mm[Hg]
Ao pk vel: 2.14 m/s
Area-P 1/2: 3.58 cm2
Height: 62 in
S' Lateral: 3.9 cm
Weight: 3291.03 [oz_av]

## 2023-07-18 LAB — CBC
HCT: 24 % — ABNORMAL LOW (ref 36.0–46.0)
Hemoglobin: 7.5 g/dL — ABNORMAL LOW (ref 12.0–15.0)
MCH: 31.5 pg (ref 26.0–34.0)
MCHC: 31.3 g/dL (ref 30.0–36.0)
MCV: 100.8 fL — ABNORMAL HIGH (ref 80.0–100.0)
Platelets: 155 10*3/uL (ref 150–400)
RBC: 2.38 MIL/uL — ABNORMAL LOW (ref 3.87–5.11)
RDW: 14.6 % (ref 11.5–15.5)
WBC: 9.5 10*3/uL (ref 4.0–10.5)
nRBC: 0 % (ref 0.0–0.2)

## 2023-07-18 LAB — BASIC METABOLIC PANEL
Anion gap: 6 (ref 5–15)
BUN: 15 mg/dL (ref 8–23)
CO2: 23 mmol/L (ref 22–32)
Calcium: 7.8 mg/dL — ABNORMAL LOW (ref 8.9–10.3)
Chloride: 108 mmol/L (ref 98–111)
Creatinine, Ser: 1.68 mg/dL — ABNORMAL HIGH (ref 0.44–1.00)
GFR, Estimated: 30 mL/min — ABNORMAL LOW (ref >60)
Glucose, Bld: 109 mg/dL — ABNORMAL HIGH (ref 70–99)
Potassium: 3.8 mmol/L (ref 3.5–5.1)
Sodium: 137 mmol/L (ref 135–145)

## 2023-07-18 MED ORDER — ADULT MULTIVITAMIN W/MINERALS CH
1.0000 | ORAL_TABLET | Freq: Every day | ORAL | Status: DC
  Administered 2023-07-18 – 2023-07-21 (×4): 1 via ORAL
  Filled 2023-07-18 (×4): qty 1

## 2023-07-18 MED ORDER — IMATINIB MESYLATE 400 MG PO TABS: 400.0000 mg | ORAL_TABLET | Freq: Every day | ORAL | Status: DC

## 2023-07-18 MED ORDER — CIPROFLOXACIN HCL 500 MG PO TABS
500.0000 mg | ORAL_TABLET | Freq: Two times a day (BID) | ORAL | Status: DC
  Administered 2023-07-18 – 2023-07-19 (×2): 500 mg via ORAL
  Filled 2023-07-18 (×2): qty 1

## 2023-07-18 MED ORDER — THYROID 30 MG PO TABS
15.0000 mg | ORAL_TABLET | Freq: Every day | ORAL | Status: DC
  Administered 2023-07-19 – 2023-07-21 (×3): 15 mg via ORAL
  Filled 2023-07-18 (×4): qty 1

## 2023-07-18 MED ORDER — METOPROLOL SUCCINATE ER 25 MG PO TB24: 12.5000 mg | ORAL_TABLET | Freq: Every day | ORAL | Status: DC

## 2023-07-18 MED ORDER — ASPIRIN 81 MG PO TBEC
81.0000 mg | DELAYED_RELEASE_TABLET | Freq: Every morning | ORAL | Status: DC
  Administered 2023-07-18 – 2023-07-21 (×4): 81 mg via ORAL
  Filled 2023-07-18 (×4): qty 1

## 2023-07-18 MED ORDER — ALLOPURINOL 100 MG PO TABS
100.0000 mg | ORAL_TABLET | Freq: Every day | ORAL | Status: DC
  Administered 2023-07-18 – 2023-07-21 (×4): 100 mg via ORAL
  Filled 2023-07-18 (×4): qty 1

## 2023-07-18 MED ORDER — METOPROLOL SUCCINATE ER 25 MG PO TB24
12.5000 mg | ORAL_TABLET | Freq: Every day | ORAL | Status: DC
  Administered 2023-07-18 – 2023-07-19 (×2): 12.5 mg via ORAL
  Filled 2023-07-18 (×3): qty 1

## 2023-07-18 MED ORDER — VITAMIN C 500 MG PO TABS
500.0000 mg | ORAL_TABLET | Freq: Every day | ORAL | Status: DC
  Administered 2023-07-18 – 2023-07-21 (×4): 500 mg via ORAL
  Filled 2023-07-18 (×4): qty 1

## 2023-07-18 MED ORDER — ATORVASTATIN CALCIUM 10 MG PO TABS
20.0000 mg | ORAL_TABLET | Freq: Every day | ORAL | Status: DC
  Administered 2023-07-18 – 2023-07-21 (×4): 20 mg via ORAL
  Filled 2023-07-18 (×4): qty 2

## 2023-07-18 NOTE — Plan of Care (Signed)

## 2023-07-18 NOTE — Progress Notes (Signed)
PROGRESS NOTE    Lisa [name]  FAO:[id number] DOB: Jul 01, 1941 DOA: 07/17/2023 PCP: Eartha Inch, MD    Brief Narrative:  82 year old with history of hypertension, coronary artery disease, chronic diastolic heart failure, CML, melanoma, chronic lymphedema both legs presented with right leg wound worse for last 1 week.  She had gone to urgent care on Tuesday, culture was taken that revealed Pseudomonas resistant to multiple antibiotics.  Blood cultures were not drawn.  She was asked to go to ER as they could not find any oral options.  Patient also complained of being more short of breath after missing doses of torsemide at home and improved after Lasix.  Subjective: Patient seen and examined.  Breathing improved with Lasix.  Mild leg pain present.  Afebrile.  Assessment & Plan:   Right leg cellulitis with Pseudomonas infection.  Bilateral lymphedema. Present with increasing redness and erythema.  Multidrug-resistant Pseudomonas from skin cultures.  Blood cultures negative so far. -Continue cefepime today. -Mobilize.  Wound care consult. -Final culture sensitivity report to be obtained from [facility] tomorrow morning.  Acute on chronic diastolic heart failure: Presented with increasing shortness of breath.  Pleural effusions and atelectasis.  BNP 823.  Also given IV fluids in the ER. Last echocardiogram in 2023 with ejection fraction 45 to 50% Echocardiogram today with ejection fraction 60%. Given Lasix with clinical response.  Output not measured.  Creatinine 1.68.  Hold further Lasix.  Will recheck renal functions tomorrow morning before giving further Lasix.  Clinically improving.  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  She is at about baseline. Hypothyroidism: Continue Synthroid. 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  Amiodarone and  aspirin.  Metoprolol.  Continue.   Mobilize.  PT OT.  Anticipate home tomorrow with oral antibiotics and wound care if clinical improvement.     DVT prophylaxis: enoxaparin (LOVENOX) injection 40 mg Start: 07/18/23 0800   Code Status: Full code Family Communication: None at the bedside Disposition Plan: Status is: Inpatient Remains inpatient appropriate because: IV antibiotics     Consultants:  Wound care  Procedures:  None  Antimicrobials:  Cefepime 12/7---     Objective: Vitals:   07/18/23 0055 07/18/23 0415 07/18/23 0500 07/18/23 0731  BP: (!) 112/52 (!) 111/50  (!) 110/40  Pulse: 75 71  78  Resp: 17 17  18   Temp: 99.9 F (37.7 C) 98.3 F (36.8 C)  99.2 F (37.3 C)  TempSrc: Oral Oral    SpO2: 96% 95%  96%  Weight:   93.3 kg   Height:        Intake/Output Summary (Last 24 hours) at 07/18/2023 1033 Last data filed at 07/18/2023 0557 Gross per 24 hour  Intake 1195.53 ml  Output --  Net 1195.53 ml   Filed Weights   07/17/23 1029 07/18/23 0500  Weight: 89.8 kg 93.3 kg    Examination:  General exam: Appears calm and comfortable  Respiratory system: No added sounds. Cardiovascular system: S1 & S2 heard, RRR.  Pacemaker present. Gastrointestinal system: Soft.  Nontender.  Bowel sound present. Central nervous system: Alert and oriented. No focal neurological deficits. Extremities: Symmetric 5 x 5 power. Skin:  Redness and erythema right anterior leg.  Mostly chronic nonpitting edema both legs.    Data Reviewed: I have personally reviewed following labs and imaging studies  CBC: Recent Labs  Lab 07/17/23 1210 07/18/23  0701  WBC 13.5* 9.5  HGB 7.7* 7.5*  HCT 24.8* 24.0*  MCV 102.1* 100.8*  PLT 213 155   Basic Metabolic Panel: Recent Labs  Lab 07/17/23 1210 07/18/23 0701  NA 140 137  K 3.9 3.8  CL 108 108  CO2 18* 23  GLUCOSE 120* 109*  BUN 17 15  CREATININE 1.37* 1.68*  CALCIUM 8.7* 7.8*   GFR: Estimated Creatinine Clearance:  27.9 mL/min (A) (by C-G formula based on SCr of 1.68 mg/dL (H)). Liver Function Tests: Recent Labs  Lab 07/17/23 1210  AST 23  ALT 17  ALKPHOS 65  BILITOT 1.2*  PROT 6.6  ALBUMIN 3.4*   Recent Labs  Lab 07/17/23 1210  LIPASE 29   No results for input(s): "AMMONIA" in the last 168 hours. Coagulation Profile: No results for input(s): "INR", "PROTIME" in the last 168 hours. Cardiac Enzymes: No results for input(s): "CKTOTAL", "CKMB", "CKMBINDEX", "TROPONINI" in the last 168 hours. BNP (last 3 results) No results for input(s): "PROBNP" in the last 8760 hours. HbA1C: Recent Labs    07/17/23 1546  HGBA1C 5.4   CBG: No results for input(s): "GLUCAP" in the last 168 hours. Lipid Profile: No results for input(s): "CHOL", "HDL", "LDLCALC", "TRIG", "CHOLHDL", "LDLDIRECT" in the last 72 hours. Thyroid Function Tests: Recent Labs    07/17/23 1233  TSH 1.619   Anemia Panel: No results for input(s): "VITAMINB12", "FOLATE", "FERRITIN", "TIBC", "IRON", "RETICCTPCT" in the last 72 hours. Sepsis Labs: Recent Labs  Lab 07/17/23 1219 07/17/23 1604  LATICACIDVEN 2.0* 0.9    No results found for this or any previous visit (from the past 240 hour(s)).       Radiology Studies: ECHOCARDIOGRAM COMPLETE  Result Date: 07/18/2023    ECHOCARDIOGRAM REPORT   Patient Name:   Lisa [name] Date of Exam: 07/18/2023 Medical Rec #:  [id number]          Height:       62.0 in Accession #:    [id number]         Weight:       205.7 lb Date of Birth:  [birth year]           BSA:          1.935 m Patient Age:    82 years           BP:           111/58 mmHg Patient Gender: F                  HR:           75 bpm. Exam Location:  Inpatient Procedure: 2D Echo, Cardiac Doppler and Color Doppler Indications:    CHF  History:        Patient has prior history of Echocardiogram examinations, most                 recent 06/18/2022. CHF, Arrythmias:PVC; Risk                 Factors:Hypertension.  Sonographer:     Darlys Gales Referring Phys: [id number] RONDELL A SMITH IMPRESSIONS  1. Left ventricular ejection fraction, by estimation, is 60 to 65%. The left ventricle has normal function. The left ventricle has no regional wall motion abnormalities. Left ventricular diastolic parameters were normal.  2. Right ventricular systolic function is normal. The right ventricular size is normal.  3. Moderate effusion anterior to LV as well as apex ? organized at apex with  echo brite rellections consider f/u chest CTA to further characterize This may reflect left pleuaral effusion with atelectasis of lung which is also present . Moderate pericardial effusion.  4. The mitral valve is abnormal. Trivial mitral valve regurgitation. No evidence of mitral stenosis.  5. The aortic valve is tricuspid. There is moderate calcification of the aortic valve. There is mild thickening of the aortic valve. Aortic valve regurgitation is not visualized. Mild aortic valve stenosis.  6. The inferior vena cava is normal in size with greater than 50% respiratory variability, suggesting right atrial pressure of 3 mmHg. FINDINGS  Left Ventricle: Left ventricular ejection fraction, by estimation, is 60 to 65%. The left ventricle has normal function. The left ventricle has no regional wall motion abnormalities. The left ventricular internal cavity size was normal in size. There is  no left ventricular hypertrophy. Left ventricular diastolic parameters were normal. Right Ventricle: The right ventricular size is normal. No increase in right ventricular wall thickness. Right ventricular systolic function is normal. Left [facility]: Left atrial size was normal in size. Right [facility]: Right atrial size was normal in size. Pericardium: Moderate effusion anterior to LV as well as apex ? organized at apex with echo brite rellections consider f/u chest CTA to further characterize This may reflect left pleuaral effusion with atelectasis of lung which is also present. A  moderately sized pericardial effusion is present. Mitral Valve: The mitral valve is abnormal. There is moderate thickening of the mitral valve leaflet(s). Trivial mitral valve regurgitation. No evidence of mitral valve stenosis. Tricuspid Valve: The tricuspid valve is normal in structure. Tricuspid valve regurgitation is trivial. No evidence of tricuspid stenosis. Aortic Valve: The aortic valve is tricuspid. There is moderate calcification of the aortic valve. There is mild thickening of the aortic valve. Aortic valve regurgitation is not visualized. Mild aortic stenosis is present. Aortic valve mean gradient measures 11.0 mmHg. Aortic valve peak gradient measures 18.3 mmHg. Aortic valve area, by VTI measures 1.59 cm. Pulmonic Valve: The pulmonic valve was normal in structure. Pulmonic valve regurgitation is trivial. No evidence of pulmonic stenosis. Aorta: The aortic root is normal in size and structure. Venous: The inferior vena cava is normal in size with greater than 50% respiratory variability, suggesting right atrial pressure of 3 mmHg. IAS/Shunts: The interatrial septum appears to be lipomatous. No atrial level shunt detected by color flow Doppler.  LEFT VENTRICLE PLAX 2D LVIDd:         5.30 cm   Diastology LVIDs:         3.90 cm   LV e' medial:    8.38 cm/s LV PW:         1.10 cm   LV E/e' medial:  11.5 LV IVS:        1.00 cm   LV e' lateral:   8.59 cm/s LVOT diam:     1.80 cm   LV E/e' lateral: 11.2 LV SV:         75 LV SV Index:   39 LVOT Area:     2.54 cm  RIGHT VENTRICLE RV S prime:     12.30 cm/s TAPSE (M-mode): 3.5 cm LEFT ATRIUM             Index        RIGHT ATRIUM          Index LA Vol (A2C):   85.2 ml 44.04 ml/m  RA Area:     8.40 cm LA Vol (A4C):   62.8 ml  32.46 ml/m  RA Volume:   12.80 ml 6.62 ml/m LA Biplane Vol: 73.1 ml 37.79 ml/m  AORTIC VALVE AV Area (Vmax):    1.64 cm AV Area (Vmean):   1.67 cm AV Area (VTI):     1.59 cm AV Vmax:           214.00 cm/s AV Vmean:          157.000  cm/s AV VTI:            0.468 m AV Peak Grad:      18.3 mmHg AV Mean Grad:      11.0 mmHg LVOT Vmax:         138.00 cm/s LVOT Vmean:        103.000 cm/s LVOT VTI:          0.293 m LVOT/AV VTI ratio: 0.63 MITRAL VALVE MV Area (PHT): 3.58 cm     SHUNTS MV Decel Time: 212 msec     Systemic VTI:  0.29 m MV E velocity: 96.40 cm/s   Systemic Diam: 1.80 cm MV A velocity: 101.00 cm/s MV E/A ratio:  0.95 Charlton Haws MD Electronically signed by Charlton Haws MD Signature Date/Time: 07/18/2023/10:29:54 AM    Final    VAS [place] LOWER EXTREMITY VENOUS (DVT) (7a-7p)  Result Date: 07/18/2023  Lower Venous DVT Study Patient Name:  Lisa [name]  Date of Exam:   07/17/2023 Medical Rec #: [id number]           Accession #:    [id number] Date of Birth: [birth year]            Patient Gender: F Patient Age:   40 years Exam Location:  [facility] Procedure:      VAS [place] LOWER EXTREMITY VENOUS (DVT) Referring Phys: Claudette Stapler --------------------------------------------------------------------------------  Indications: Pain, Edema, Erythema, and Lymphedema, weeping.  Risk Factors: Venous reflux disease. Limitations: Body habitus and bandages. Comparison Study: No prior LEV on file Performing Technologist: Sherren Kerns RVS  Examination Guidelines: A complete evaluation includes B-mode imaging, spectral Doppler, color Doppler, and power Doppler as needed of all accessible portions of each vessel. Bilateral testing is considered an integral part of a complete examination. Limited examinations for reoccurring indications may be performed as noted. The reflux portion of the exam is performed with the patient in reverse Trendelenburg.  +---------+---------------+---------+-----------+---------------+--------------+ RIGHT    CompressibilityPhasicitySpontaneityProperties     Thrombus Aging +---------+---------------+---------+-----------+---------------+--------------+ CFV      Full           Yes      No          pulsatile                                                                 waveforms                     +---------+---------------+---------+-----------+---------------+--------------+ SFJ      Full                                                             +---------+---------------+---------+-----------+---------------+--------------+  FV Prox  Full                                                             +---------+---------------+---------+-----------+---------------+--------------+ FV Mid   Full           Yes      No         pulsatile                                                                 waveforms                     +---------+---------------+---------+-----------+---------------+--------------+ FV Distal               Yes      No         pulsatile                                                                 waveforms                     +---------+---------------+---------+-----------+---------------+--------------+ PFV      Full                                                             +---------+---------------+---------+-----------+---------------+--------------+ POP      Full           Yes      No         pulsatile                                                                 waveforms                     +---------+---------------+---------+-----------+---------------+--------------+ PTV                                                        Not well  visualized     +---------+---------------+---------+-----------+---------------+--------------+ PERO                                                       Not well                                                                  visualized     +---------+---------------+---------+-----------+---------------+--------------+    +---------+---------------+---------+-----------+---------------+--------------+ LEFT     CompressibilityPhasicitySpontaneityProperties     Thrombus Aging +---------+---------------+---------+-----------+---------------+--------------+ CFV      Full           Yes      No         pulsatile                                                                 waveforms                     +---------+---------------+---------+-----------+---------------+--------------+ SFJ      Full                                                             +---------+---------------+---------+-----------+---------------+--------------+ FV Prox  Full                                                             +---------+---------------+---------+-----------+---------------+--------------+ FV Mid   Full                                                             +---------+---------------+---------+-----------+---------------+--------------+ FV DistalFull                                                             +---------+---------------+---------+-----------+---------------+--------------+ PFV      Full                                                             +---------+---------------+---------+-----------+---------------+--------------+  POP      Full           Yes      No         pulsatile                                                                 waveforms                     +---------+---------------+---------+-----------+---------------+--------------+ PTV                                                        Not well                                                                  visualized     +---------+---------------+---------+-----------+---------------+--------------+ PERO                                                       Not well                                                                  visualized      +---------+---------------+---------+-----------+---------------+--------------+     Summary: RIGHT: - There is no evidence of deep vein thrombosis in the lower extremity. However, portions of this examination were limited- see technologist comments above.  - No cystic structure found in the popliteal fossa.  LEFT: - There is no evidence of deep vein thrombosis in the lower extremity. However, portions of this examination were limited- see technologist comments above.  - No cystic structure found in the popliteal fossa.  *See table(s) above for measurements and observations. Electronically signed by Carolynn Sayers on 07/18/2023 at 8:58:32 AM.    Final    US Abdomen Limited RUQ (LIVER/GB)  Result Date: 07/17/2023 CLINICAL DATA:  Right upper quadrant pain EXAM: ULTRASOUND ABDOMEN LIMITED RIGHT UPPER QUADRANT COMPARISON:  None Available. FINDINGS: Gallbladder: No gallstones or wall thickening visualized. No sonographic Murphy sign noted by sonographer. Common bile duct: Diameter: 2.8 mm Liver: No focal liver abnormality. Increased parenchymal echogenicity of the liver. Portal vein is patent on color Doppler imaging with normal direction of blood flow towards the liver. Other: None. IMPRESSION: 1. No acute findings. 2. Increased parenchymal echogenicity of the liver is a nonspecific finding but is most commonly seen with fatty infiltration of the liver. Electronically Signed   By: Signa Kell M.D.   On: 07/17/2023 13:20   DG  Chest Portable 1 View  Result Date: 07/17/2023 CLINICAL DATA:  Shortness of breath. EXAM: PORTABLE CHEST 1 VIEW COMPARISON:  06/16/2022 FINDINGS: AP portable radiograph.  Pacer/ICD again identified. Numerous leads and wires project over the chest. Midline trachea. Moderate to marked cardiomegaly. No pneumothorax. Opacity throughout the inferior left hemithorax likely represents a large left pleural effusion with lower lung predominant airspace disease. Underlying mild interstitial  prominence and indistinctness. Clear right lung. IMPRESSION: Large left pleural effusion with adjacent airspace disease, most likely atelectasis. Cannot exclude infection or aspiration. Cardiomegaly and mild congestive heart failure. Aortic Atherosclerosis (ICD10-I70.0). Electronically Signed   By: Jeronimo Greaves M.D.   On: 07/17/2023 13:13        Scheduled Meds:  allopurinol  100 mg Oral Daily   amiodarone  200 mg Oral QHS   ascorbic acid  500 mg Oral Daily   aspirin EC  81 mg Oral q AM   atorvastatin  20 mg Oral Daily   enoxaparin (LOVENOX) injection  40 mg Subcutaneous Q24H   imatinib  400 mg Oral QHS   metoprolol succinate  12.5 mg Oral QHS   multivitamin with minerals  1 tablet Oral Daily   sodium chloride flush  3 mL Intravenous Q12H   [START ON 07/19/2023] thyroid  15 mg Oral QAC breakfast   Continuous Infusions:  ceFEPime (MAXIPIME) IV 2 g (07/18/23 0126)     LOS: 1 day    Time spent: 40 minutes    Dorcas Carrow, MD [facility]

## 2023-07-18 NOTE — Consult Note (Signed)
WOC Nurse Consult Note: Reason for Consult: LE wound New onset per notes of RLE wound; images reviewed; history of lymphedema Wound type: venous/lymphedema stasis ulceration; partial thickness Pressure Injury POA: NA Measurement: see nursing flowsheets; extensive wound over the pretibial and posterior RLE Wound bed:100% pink, clean Drainage (amount, consistency, odor) heavy, serous drainage  Periwound: lymphedema skin changes  Dressing procedure/placement/frequency: Cleanse RLE wounds with Monte Fantasia [place] # [phone]), cover wounds with Aquacel Ag+ [place] # [id number]. Top with ABD pads, secure with kerlix and ACE wraps. Change daily  Elevate as much as possible  Patient would benefit from manual massage and treatment from certified lymphedema therapist, this is not offered inpatient. I have included outpatient resources   Lymphedema  Resources (updated 06/2021) Each site requires a referral from your primary care MD [facility] [address] [place], [place]  [phone] (Upper extremities)  [address] [place], [place] [phone] (Lower extremities, PATIENT CAN NOT HAVE A WOUND)  [facility] [address] [place], [place] [address] [phone]  [facility] [address]  [place], [place] [phone]  [facility] [address] [place], [place] [address] [phone]  [facility]  (only treatment for lymphedema related to cancer diagnosis) [address]  [place], [place] [address] [phone]    [facility] [address] [place], [place] [address] [phone] [facility] ([facility]) [phone] [address] [place], [place] [address] [phone]

## 2023-07-18 NOTE — Progress Notes (Addendum)
  Given patient has active infection right leg cellulitis cannot continue chemotherapeutic agent right now. - Holding imatinib until right knee cellulitis resolves.  Tereasa Coop, MD [facility] 07/18/2023, 9:40 PM    Update, patient is very resistant and adamant and she wants to take her imatinib and does not want to skip.  Explained the patient multiple times that given patient has an active infection chemotherapy is contraindicated as it will make her more immunocompromised and there is risk for development of sepsis and septic shock and low chance of dying from the infection.  Patient does not want to understand anything and she wants the imatinib.  Update second time, received a call from pharmacy.  We cannot restart imatinib as we need oncologist approval to start chemotherapy medication.  We have to wait in the daytime to find out with oncology regarding this..  Patient Adamant and requesting for her immunotherapy again and again and I have to explain her almost for 30 to 35 minutes that in the setting of infection I cannot give a chemotherapy agent also we need and oncologist authorization for this chemotherapy to order in the system. Patient wants to reach out to her oncologist at [facility] but I have explained that we have to reach out to them in the daytime. Patient's oncologist information following :  Dorothy Spark, MD (Attending) NPI: [id number] [phone] (Work) [phone] [facility]) [address] [place], [place] [address] Hematology and Oncology    Tereasa Coop, MD [facility] 07/18/2023, 9:50 PM

## 2023-07-19 ENCOUNTER — Telehealth: Payer: Self-pay

## 2023-07-19 ENCOUNTER — Inpatient Hospital Stay (HOSPITAL_COMMUNITY): Payer: Medicare Other

## 2023-07-19 DIAGNOSIS — S81801A Unspecified open wound, right lower leg, initial encounter: Secondary | ICD-10-CM | POA: Diagnosis not present

## 2023-07-19 DIAGNOSIS — I890 Lymphedema, not elsewhere classified: Secondary | ICD-10-CM | POA: Diagnosis not present

## 2023-07-19 DIAGNOSIS — L03115 Cellulitis of right lower limb: Secondary | ICD-10-CM | POA: Diagnosis not present

## 2023-07-19 LAB — BASIC METABOLIC PANEL
Anion gap: 8 (ref 5–15)
BUN: 20 mg/dL (ref 8–23)
CO2: 21 mmol/L — ABNORMAL LOW (ref 22–32)
Calcium: 7.6 mg/dL — ABNORMAL LOW (ref 8.9–10.3)
Chloride: 105 mmol/L (ref 98–111)
Creatinine, Ser: 1.61 mg/dL — ABNORMAL HIGH (ref 0.44–1.00)
GFR, Estimated: 32 mL/min — ABNORMAL LOW (ref >60)
Glucose, Bld: 120 mg/dL — ABNORMAL HIGH (ref 70–99)
Potassium: 3.8 mmol/L (ref 3.5–5.1)
Sodium: 134 mmol/L — ABNORMAL LOW (ref 135–145)

## 2023-07-19 LAB — MAGNESIUM: Magnesium: 1.8 mg/dL (ref 1.7–2.4)

## 2023-07-19 LAB — PHOSPHORUS: Phosphorus: 2.9 mg/dL (ref 2.5–4.6)

## 2023-07-19 MED ORDER — IMATINIB MESYLATE 400 MG PO TABS: 400.0000 mg | ORAL_TABLET | Freq: Every day | ORAL | Status: DC

## 2023-07-19 MED ORDER — SODIUM CHLORIDE 0.9 % IV SOLN
2.0000 g | Freq: Two times a day (BID) | INTRAVENOUS | Status: DC
  Administered 2023-07-19 – 2023-07-21 (×5): 2 g via INTRAVENOUS
  Filled 2023-07-19 (×4): qty 12.5

## 2023-07-19 MED ORDER — IMATINIB MESYLATE 400 MG PO TABS
400.0000 mg | ORAL_TABLET | Freq: Every day | ORAL | Status: DC
  Administered 2023-07-19 – 2023-07-20 (×2): 400 mg via ORAL
  Filled 2023-07-19 (×4): qty 1

## 2023-07-19 MED ORDER — IMATINIB MESYLATE 100 MG PO TABS: 400.0000 mg | ORAL_TABLET | Freq: Every day | ORAL | Status: DC

## 2023-07-19 MED ORDER — IMATINIB MESYLATE 100 MG PO TABS
400.0000 mg | ORAL_TABLET | Freq: Every day | ORAL | Status: DC
  Filled 2023-07-19: qty 4

## 2023-07-19 NOTE — Telephone Encounter (Signed)
Returned pt's call. Call was disconnected.

## 2023-07-19 NOTE — Plan of Care (Signed)

## 2023-07-19 NOTE — Telephone Encounter (Signed)
Pt left a voicemail for Dr. Ladona Ridgel nurse to give her a call back.Her phone number is [phone].

## 2023-07-19 NOTE — Evaluation (Signed)
Physical Therapy Evaluation Patient Details Name: Lisa [name] MRN: [id number] DOB: 02-19-1941 Today's Date: 07/19/2023  History of Present Illness  Pt is an 82 year old woman admitted on 12/7 with worsening R LE wound x 1 week. + for pseudomonas, CHF exacerbation, L pleural effusion. 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  Clinical Impression  Pt admitted with above diagnosis. Previously very active, mod I with mobility, using SPC in home, Rollator out of home, driving. Required CGA with gait, became dyspneic and SpO2 recorded at 78%, but quickly rose to low 90s with seated rest and pursed lip breathing techniques while on room air.  Pt currently with functional limitations due to the deficits listed below (see PT Problem List). Pt will benefit from acute skilled PT to increase their independence and safety with mobility to allow discharge.           If plan is discharge home, recommend the following: Help with stairs or ramp for entrance   Can travel by private vehicle        Equipment Recommendations None recommended by PT  Recommendations for Other Services       Functional Status Assessment Patient has had a recent decline in their functional status and demonstrates the ability to make significant improvements in function in a reasonable and predictable amount of time.     Precautions / Restrictions Precautions Precautions: Fall Precaution Comments: watch O2 Restrictions Weight Bearing Restrictions: No      Mobility  Bed Mobility Overal bed mobility: Needs Assistance Bed Mobility: Supine to Sit, Sit to Supine     Supine to sit: Modified independent (Device/Increase time), HOB elevated, Used rails Sit to supine: Min assist   General bed mobility comments: Mod I to rise. Assisted LEs into bed, uses towel at baseline.    Transfers Overall transfer level: Needs assistance Equipment used: Rollator (4 wheels) Transfers: Sit  to/from Stand Sit to Stand: Contact guard assist           General transfer comment: CGA for safety, no physical assist, good power up from bed and rollator seat.    Ambulation/Gait Ambulation/Gait assistance: Contact guard assist Gait Distance (Feet): 105 Feet Assistive device: Rollator (4 wheels) Gait Pattern/deviations: Step-through pattern, Decreased stride length, Wide base of support Gait velocity: dec Gait velocity interpretation: <1.8 ft/sec, indicate of risk for recurrent falls   General Gait Details: CGA for safety, no LOB noted, good rollator control. Became dyspneic. SpO2 check towards return to room 78% on RA. Seated rest break to recover, returned to low 90s on RA with cues for pursed lip breathing within 2 minues.  Stairs            Wheelchair Mobility     Tilt Bed    Modified Rankin (Stroke Patients Only)       Balance Overall balance assessment: Needs assistance Sitting-balance support: Feet supported, No upper extremity supported Sitting balance-Leahy Scale: Good     Standing balance support: No upper extremity supported Standing balance-Leahy Scale: Fair Standing balance comment: more stable with rollator                             Pertinent Vitals/Pain Pain Assessment Faces Pain Scale: No hurt    Home Living Family/patient expects to be discharged to:: Private residence Living Arrangements: Alone Available Help at Discharge: Family;Available PRN/intermittently Type of Home: House Home Access: Stairs to enter Entrance  Stairs-Rails: Right Entrance Stairs-Number of Steps: 3   Home Layout: Two level;Able to live on main level with bedroom/bathroom Home Equipment: Rollator (4 wheels);Shower seat;Cane - single point      Prior Function Prior Level of Function : Independent/Modified Independent;Driving             Mobility Comments: Amb with SPC in house, rollator out of house ADLs Comments: Drives, ind, has shower  chair with no back. Likes to read, cook, go to restaurants. Was managing her LE dressings.     Extremity/Trunk Assessment   Upper Extremity Assessment Upper Extremity Assessment: Defer to OT evaluation    Lower Extremity Assessment Lower Extremity Assessment: Generalized weakness (EDEMA BIL)    Cervical / Trunk Assessment Cervical / Trunk Assessment: Normal  Communication   Communication Communication: No apparent difficulties  Cognition Arousal: Alert Behavior During Therapy: WFL for tasks assessed/performed Overall Cognitive Status: Within Functional Limits for tasks assessed                                          General Comments      Exercises     Assessment/Plan    PT Assessment Patient needs continued PT services  PT Problem List Decreased strength;Decreased range of motion;Decreased activity tolerance;Decreased balance;Decreased mobility;Cardiopulmonary status limiting activity;Obesity       PT Treatment Interventions DME instruction;Gait training;Stair training;Functional mobility training;Therapeutic activities;Therapeutic exercise;Balance training;Neuromuscular re-education;Patient/family education    PT Goals (Current goals can be found in the Care Plan section)  Acute Rehab PT Goals Patient Stated Goal: get well go home PT Goal Formulation: With patient Time For Goal Achievement: 08/02/23 Potential to Achieve Goals: Good    Frequency Min 1X/week     Co-evaluation               AM-PAC PT "6 Clicks" Mobility  Outcome Measure Help needed turning from your back to your side while in a flat bed without using bedrails?: None Help needed moving from lying on your back to sitting on the side of a flat bed without using bedrails?: None Help needed moving to and from a bed to a chair (including a wheelchair)?: A Little Help needed standing up from a chair using your arms (e.g., wheelchair or bedside chair)?: A Little Help needed to  walk in hospital room?: A Little Help needed climbing 3-5 steps with a railing? : A Little 6 Click Score: 20    End of Session Equipment Utilized During Treatment: Gait belt Activity Tolerance: Patient tolerated treatment well Patient left: in bed;with call bell/phone within reach;with bed alarm set Nurse Communication: Mobility status (O2) PT Visit Diagnosis: Difficulty in walking, not elsewhere classified (R26.2);Muscle weakness (generalized) (M62.81);Unsteadiness on feet (R26.81)    Time: 8119-1478 PT Time Calculation (min) (ACUTE ONLY): 16 min   Charges:   PT Evaluation $PT Eval Low Complexity: 1 Low   PT General Charges $$ ACUTE PT VISIT: 1 Visit         Kathlyn Sacramento, PT, DPT [facility]  [facility] Services Physical Therapist Office: [phone] Website: [email or web address]   Berton Mount 07/19/2023, 2:00 PM

## 2023-07-19 NOTE — Progress Notes (Signed)
PROGRESS NOTE    Lisa [name]  NFA:[id number] DOB: 05-Nov-1940 DOA: 07/17/2023 PCP: Eartha Inch, MD    Brief Narrative:  82 year old with history of hypertension, coronary artery disease, chronic diastolic heart failure, CML, melanoma, chronic lymphedema both legs presented with right leg wound worse for last 1 week.  She had gone to urgent care on Tuesday, culture was taken that revealed Pseudomonas resistant to multiple antibiotics.  Blood cultures were not drawn.  She was asked to go to ER as they could not find any oral options.  Patient also complained of being more short of breath after missing doses of torsemide at home and improved after Lasix.  Subjective:  Patient seen and examined.  Wound examined.  Pictures below.  She declined to have IV line yesterday evening so she was given a dose of ciprofloxacin by mouth. Breathing is better, reported oxygen saturation 82% at night.  Lasix on hold.  X-ray shows very impressive pleural effusion on the left, will send for thoracentesis. Leg wound is looking better.  Cultures with multiple bacteria including Pseudomonas.  No oral options available.  Case discussed with infectious disease team, continuing cefepime today.  Patient's oncologist PA from [facility] called and spoke to me to continue Gleevac as her local infection is already getting better.  Assessment & Plan:   Right leg cellulitis with Pseudomonas infection.  Bilateral lymphedema. Presented with increasing redness and erythema.  Multidrug-resistant Pseudomonas from a skilled culture.  Blood cultures negative.  No oral options available. Continue cefepime until clinical improvement. Wound care to see. Mobilize with PT OT. Skin cultures from urgent care reviewed, it was growing multidrug-resistant Pseudomonas, multiple other mixed organisms.  Acute on chronic diastolic heart failure: Presented with increasing shortness of breath.  Pleural effusions and atelectasis.   BNP 823.  Also given IV fluids in the ER. Last echocardiogram in 2023 with ejection fraction 45 to 50% Echocardiogram today with ejection fraction 60%. Given Lasix with clinical response.  Creatinine 1.68-1.6.  Hold further Lasix.  Will recheck renal functions tomorrow morning before giving further Lasix.  Clinically improving. Has significant left-sided pleural effusion and presented with hypoxemia.  Will try therapeutic thoracentesis.  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  Communicated with her oncologist and they recommended to continue Gleevac as patient has localized infection. CKD stage IIIb, her creatinine ranges from 1.3-1.7.  She is at about baseline. Hypothyroidism: Continue Synthroid. Coronary artery disease, history of V. tach: Patient has an AICD. Amiodarone and aspirin.  Metoprolol.  Continue.   Mobilize.  PT OT.  Thoracentesis.   DVT prophylaxis: enoxaparin (LOVENOX) injection 40 mg Start: 07/18/23 0800   Code Status: Full code Family Communication: None at the bedside Disposition Plan: Status is: Inpatient Remains inpatient appropriate because: IV antibiotics     Consultants:  Wound care  Procedures:  None  Antimicrobials:  Cefepime 12/7---     Objective: Vitals:   07/18/23 1542 07/18/23 2025 07/19/23 0613 07/19/23 0730  BP: (!) 113/46 (!) 121/47 (!) 131/55 (!) 136/55  Pulse: 79 83 76 72  Resp: 16 18 18 18   Temp: 98.6 F (37 C) 98.9 F (37.2 C) 98.7 F (37.1 C) 98.2 F (36.8 C)  TempSrc: Oral Oral Oral   SpO2: 94% (!) 83% 97% 93%  Weight:      Height:        Intake/Output Summary (Last 24 hours) at 07/19/2023 1156 Last data filed at 07/19/2023 1009 Gross  per 24 hour  Intake 489 ml  Output --  Net 489 ml   Filed Weights   07/17/23 1029 07/18/23 0500  Weight: 89.8 kg 93.3 kg    Examination:  General exam: Appears calm and comfortable  Respiratory system: No added sounds.  Poor air  entry at bases. Cardiovascular system: S1 & S2 heard, RRR.  Pacemaker present. Gastrointestinal system: Soft.  Nontender.  Bowel sound present. Central nervous system: Alert and oriented. No focal neurological deficits. Extremities: Symmetric 5 x 5 power. Skin:  Redness and erythema right anterior leg.  Mostly chronic nonpitting edema both legs. Inflammatory changes improving from before.     Data Reviewed: I have personally reviewed following labs and imaging studies  CBC: Recent Labs  Lab 07/17/23 1210 07/18/23 0701  WBC 13.5* 9.5  HGB 7.7* 7.5*  HCT 24.8* 24.0*  MCV 102.1* 100.8*  PLT 213 155   Basic Metabolic Panel: Recent Labs  Lab 07/17/23 1210 07/18/23 0701 07/19/23 0642  NA 140 137 134*  K 3.9 3.8 3.8  CL 108 108 105  CO2 18* 23 21*  GLUCOSE 120* 109* 120*  BUN 17 15 20   CREATININE 1.37* 1.68* 1.61*  CALCIUM 8.7* 7.8* 7.6*  MG  --   --  1.8  PHOS  --   --  2.9   GFR: Estimated Creatinine Clearance: 29.2 mL/min (A) (by C-G formula based on SCr of 1.61 mg/dL (H)). Liver Function Tests: Recent Labs  Lab 07/17/23 1210  AST 23  ALT 17  ALKPHOS 65  BILITOT 1.2*  PROT 6.6  ALBUMIN 3.4*   Recent Labs  Lab 07/17/23 1210  LIPASE 29   No results for input(s): "AMMONIA" in the last 168 hours. Coagulation Profile: No results for input(s): "INR", "PROTIME" in the last 168 hours. Cardiac Enzymes: No results for input(s): "CKTOTAL", "CKMB", "CKMBINDEX", "TROPONINI" in the last 168 hours. BNP (last 3 results) No results for input(s): "PROBNP" in the last 8760 hours. HbA1C: Recent Labs    07/17/23 1546  HGBA1C 5.4   CBG: No results for input(s): "GLUCAP" in the last 168 hours. Lipid Profile: No results for input(s): "CHOL", "HDL", "LDLCALC", "TRIG", "CHOLHDL", "LDLDIRECT" in the last 72 hours. Thyroid Function Tests: Recent Labs    07/17/23 1233  TSH 1.619   Anemia Panel: No results for input(s): "VITAMINB12", "FOLATE", "FERRITIN", "TIBC",  "IRON", "RETICCTPCT" in the last 72 hours. Sepsis Labs: Recent Labs  Lab 07/17/23 1219 07/17/23 1604  LATICACIDVEN 2.0* 0.9    Recent Results (from the past 240 hour(s))  Blood culture (routine x 2)     Status: None (Preliminary result)   Collection Time: 07/17/23 12:10 PM   Specimen: BLOOD RIGHT ARM  Result Value Ref Range Status   Specimen Description BLOOD RIGHT ARM  Final   Special Requests   Final    BOTTLES DRAWN AEROBIC AND ANAEROBIC Blood Culture results may not be optimal due to an inadequate volume of blood received in culture bottles   Culture   Final    NO GROWTH 2 DAYS Performed at [facility], [address] [place], [place] [address]    Report Status PENDING  Incomplete  Blood culture (routine x 2)     Status: None (Preliminary result)   Collection Time: 07/17/23  5:17 PM   Specimen: BLOOD  Result Value Ref Range Status   Specimen Description BLOOD BLOOD LEFT ARM  Final   Special Requests   Final    BOTTLES DRAWN AEROBIC  AND ANAEROBIC Blood Culture results may not be optimal due to an inadequate volume of blood received in culture bottles   Culture   Final    NO GROWTH 2 DAYS Performed at [facility], [address], [place], [place] [address]    Report Status PENDING  Incomplete         Radiology Studies: ECHOCARDIOGRAM COMPLETE  Result Date: 07/18/2023    ECHOCARDIOGRAM REPORT   Patient Name:   Lisa [name] Date of Exam: 07/18/2023 Medical Rec #:  [id number]          Height:       62.0 in Accession #:    [id number]         Weight:       205.7 lb Date of Birth:  17-Oct-1940           BSA:          1.935 m Patient Age:    82 years           BP:           111/58 mmHg Patient Gender: F                  HR:           75 bpm. Exam Location:  Inpatient Procedure: 2D Echo, Cardiac Doppler and Color Doppler Indications:    CHF  History:        Patient has prior history of Echocardiogram examinations, most                 recent 06/18/2022. CHF,  Arrythmias:PVC; Risk                 Factors:Hypertension.  Sonographer:    Darlys Gales Referring Phys: [id number] RONDELL A SMITH IMPRESSIONS  1. Left ventricular ejection fraction, by estimation, is 60 to 65%. The left ventricle has normal function. The left ventricle has no regional wall motion abnormalities. Left ventricular diastolic parameters were normal.  2. Right ventricular systolic function is normal. The right ventricular size is normal.  3. Moderate effusion anterior to LV as well as apex ? organized at apex with echo brite rellections consider f/u chest CTA to further characterize This may reflect left pleuaral effusion with atelectasis of lung which is also present . Moderate pericardial effusion.  4. The mitral valve is abnormal. Trivial mitral valve regurgitation. No evidence of mitral stenosis.  5. The aortic valve is tricuspid. There is moderate calcification of the aortic valve. There is mild thickening of the aortic valve. Aortic valve regurgitation is not visualized. Mild aortic valve stenosis.  6. The inferior vena cava is normal in size with greater than 50% respiratory variability, suggesting right atrial pressure of 3 mmHg. FINDINGS  Left Ventricle: Left ventricular ejection fraction, by estimation, is 60 to 65%. The left ventricle has normal function. The left ventricle has no regional wall motion abnormalities. The left ventricular internal cavity size was normal in size. There is  no left ventricular hypertrophy. Left ventricular diastolic parameters were normal. Right Ventricle: The right ventricular size is normal. No increase in right ventricular wall thickness. Right ventricular systolic function is normal. Left [facility]: Left atrial size was normal in size. Right [facility]: Right atrial size was normal in size. Pericardium: Moderate effusion anterior to LV as well as apex ? organized at apex with echo brite rellections consider f/u chest CTA to further characterize This may reflect left  pleuaral effusion with atelectasis of  lung which is also present. A moderately sized pericardial effusion is present. Mitral Valve: The mitral valve is abnormal. There is moderate thickening of the mitral valve leaflet(s). Trivial mitral valve regurgitation. No evidence of mitral valve stenosis. Tricuspid Valve: The tricuspid valve is normal in structure. Tricuspid valve regurgitation is trivial. No evidence of tricuspid stenosis. Aortic Valve: The aortic valve is tricuspid. There is moderate calcification of the aortic valve. There is mild thickening of the aortic valve. Aortic valve regurgitation is not visualized. Mild aortic stenosis is present. Aortic valve mean gradient measures 11.0 mmHg. Aortic valve peak gradient measures 18.3 mmHg. Aortic valve area, by VTI measures 1.59 cm. Pulmonic Valve: The pulmonic valve was normal in structure. Pulmonic valve regurgitation is trivial. No evidence of pulmonic stenosis. Aorta: The aortic root is normal in size and structure. Venous: The inferior vena cava is normal in size with greater than 50% respiratory variability, suggesting right atrial pressure of 3 mmHg. IAS/Shunts: The interatrial septum appears to be lipomatous. No atrial level shunt detected by color flow Doppler.  LEFT VENTRICLE PLAX 2D LVIDd:         5.30 cm   Diastology LVIDs:         3.90 cm   LV e' medial:    8.38 cm/s LV PW:         1.10 cm   LV E/e' medial:  11.5 LV IVS:        1.00 cm   LV e' lateral:   8.59 cm/s LVOT diam:     1.80 cm   LV E/e' lateral: 11.2 LV SV:         75 LV SV Index:   39 LVOT Area:     2.54 cm  RIGHT VENTRICLE RV S prime:     12.30 cm/s TAPSE (M-mode): 3.5 cm LEFT ATRIUM             Index        RIGHT ATRIUM          Index LA Vol (A2C):   85.2 ml 44.04 ml/m  RA Area:     8.40 cm LA Vol (A4C):   62.8 ml 32.46 ml/m  RA Volume:   12.80 ml 6.62 ml/m LA Biplane Vol: 73.1 ml 37.79 ml/m  AORTIC VALVE AV Area (Vmax):    1.64 cm AV Area (Vmean):   1.67 cm AV Area (VTI):      1.59 cm AV Vmax:           214.00 cm/s AV Vmean:          157.000 cm/s AV VTI:            0.468 m AV Peak Grad:      18.3 mmHg AV Mean Grad:      11.0 mmHg LVOT Vmax:         138.00 cm/s LVOT Vmean:        103.000 cm/s LVOT VTI:          0.293 m LVOT/AV VTI ratio: 0.63 MITRAL VALVE MV Area (PHT): 3.58 cm     SHUNTS MV Decel Time: 212 msec     Systemic VTI:  0.29 m MV E velocity: 96.40 cm/s   Systemic Diam: 1.80 cm MV A velocity: 101.00 cm/s MV E/A ratio:  0.95 Charlton Haws MD Electronically signed by Charlton Haws MD Signature Date/Time: 07/18/2023/10:29:54 AM    Final    VAS [place] LOWER EXTREMITY VENOUS (DVT) (7a-7p)  Result Date: 07/18/2023  Lower Venous DVT Study Patient Name:  Lisa [name]  Date of Exam:   07/17/2023 Medical Rec #: [id number]           Accession #:    [id number] Date of Birth: 1941/06/29            Patient Gender: F Patient Age:   65 years Exam Location:  [facility] Procedure:      VAS [place] LOWER EXTREMITY VENOUS (DVT) Referring Phys: Claudette Stapler --------------------------------------------------------------------------------  Indications: Pain, Edema, Erythema, and Lymphedema, weeping.  Risk Factors: Venous reflux disease. Limitations: Body habitus and bandages. Comparison Study: No prior LEV on file Performing Technologist: Sherren Kerns RVS  Examination Guidelines: A complete evaluation includes B-mode imaging, spectral Doppler, color Doppler, and power Doppler as needed of all accessible portions of each vessel. Bilateral testing is considered an integral part of a complete examination. Limited examinations for reoccurring indications may be performed as noted. The reflux portion of the exam is performed with the patient in reverse Trendelenburg.  +---------+---------------+---------+-----------+---------------+--------------+ RIGHT    CompressibilityPhasicitySpontaneityProperties     Thrombus Aging  +---------+---------------+---------+-----------+---------------+--------------+ CFV      Full           Yes      No         pulsatile                                                                 waveforms                     +---------+---------------+---------+-----------+---------------+--------------+ SFJ      Full                                                             +---------+---------------+---------+-----------+---------------+--------------+ FV Prox  Full                                                             +---------+---------------+---------+-----------+---------------+--------------+ FV Mid   Full           Yes      No         pulsatile                                                                 waveforms                     +---------+---------------+---------+-----------+---------------+--------------+ FV Distal               Yes      No         pulsatile  waveforms                     +---------+---------------+---------+-----------+---------------+--------------+ PFV      Full                                                             +---------+---------------+---------+-----------+---------------+--------------+ POP      Full           Yes      No         pulsatile                                                                 waveforms                     +---------+---------------+---------+-----------+---------------+--------------+ PTV                                                        Not well                                                                  visualized     +---------+---------------+---------+-----------+---------------+--------------+ PERO                                                       Not well                                                                  visualized      +---------+---------------+---------+-----------+---------------+--------------+   +---------+---------------+---------+-----------+---------------+--------------+ LEFT     CompressibilityPhasicitySpontaneityProperties     Thrombus Aging +---------+---------------+---------+-----------+---------------+--------------+ CFV      Full           Yes      No         pulsatile                                                                 waveforms                     +---------+---------------+---------+-----------+---------------+--------------+  SFJ      Full                                                             +---------+---------------+---------+-----------+---------------+--------------+ FV Prox  Full                                                             +---------+---------------+---------+-----------+---------------+--------------+ FV Mid   Full                                                             +---------+---------------+---------+-----------+---------------+--------------+ FV DistalFull                                                             +---------+---------------+---------+-----------+---------------+--------------+ PFV      Full                                                             +---------+---------------+---------+-----------+---------------+--------------+ POP      Full           Yes      No         pulsatile                                                                 waveforms                     +---------+---------------+---------+-----------+---------------+--------------+ PTV                                                        Not well                                                                  visualized     +---------+---------------+---------+-----------+---------------+--------------+ PERO  Not well                                                                   visualized     +---------+---------------+---------+-----------+---------------+--------------+     Summary: RIGHT: - There is no evidence of deep vein thrombosis in the lower extremity. However, portions of this examination were limited- see technologist comments above.  - No cystic structure found in the popliteal fossa.  LEFT: - There is no evidence of deep vein thrombosis in the lower extremity. However, portions of this examination were limited- see technologist comments above.  - No cystic structure found in the popliteal fossa.  *See table(s) above for measurements and observations. Electronically signed by Carolynn Sayers on 07/18/2023 at 8:58:32 AM.    Final    US Abdomen Limited RUQ (LIVER/GB)  Result Date: 07/17/2023 CLINICAL DATA:  Right upper quadrant pain EXAM: ULTRASOUND ABDOMEN LIMITED RIGHT UPPER QUADRANT COMPARISON:  None Available. FINDINGS: Gallbladder: No gallstones or wall thickening visualized. No sonographic Murphy sign noted by sonographer. Common bile duct: Diameter: 2.8 mm Liver: No focal liver abnormality. Increased parenchymal echogenicity of the liver. Portal vein is patent on color Doppler imaging with normal direction of blood flow towards the liver. Other: None. IMPRESSION: 1. No acute findings. 2. Increased parenchymal echogenicity of the liver is a nonspecific finding but is most commonly seen with fatty infiltration of the liver. Electronically Signed   By: Signa Kell M.D.   On: 07/17/2023 13:20   DG Chest Portable 1 View  Result Date: 07/17/2023 CLINICAL DATA:  Shortness of breath. EXAM: PORTABLE CHEST 1 VIEW COMPARISON:  06/16/2022 FINDINGS: AP portable radiograph.  Pacer/ICD again identified. Numerous leads and wires project over the chest. Midline trachea. Moderate to marked cardiomegaly. No pneumothorax. Opacity throughout the inferior left hemithorax likely represents a large left pleural effusion with  lower lung predominant airspace disease. Underlying mild interstitial prominence and indistinctness. Clear right lung. IMPRESSION: Large left pleural effusion with adjacent airspace disease, most likely atelectasis. Cannot exclude infection or aspiration. Cardiomegaly and mild congestive heart failure. Aortic Atherosclerosis (ICD10-I70.0). Electronically Signed   By: Jeronimo Greaves M.D.   On: 07/17/2023 13:13        Scheduled Meds:  allopurinol  100 mg Oral Daily   amiodarone  200 mg Oral QHS   ascorbic acid  500 mg Oral Daily   aspirin EC  81 mg Oral q AM   atorvastatin  20 mg Oral Daily   enoxaparin (LOVENOX) injection  40 mg Subcutaneous Q24H   [START ON 07/20/2023] imatinib  400 mg Oral Q breakfast   metoprolol succinate  12.5 mg Oral QHS   multivitamin with minerals  1 tablet Oral Daily   sodium chloride flush  3 mL Intravenous Q12H   thyroid  15 mg Oral QAC breakfast   Continuous Infusions:  ceFEPime (MAXIPIME) IV       LOS: 2 days    Time spent: 40 minutes    Dorcas Carrow, MD [facility]

## 2023-07-19 NOTE — Progress Notes (Signed)
Transition of Care [facility]) - Inpatient Brief Assessment   Patient Details  Name: Lisa [name] MRN: [id number] Date of Birth: [birth year]  [facility] CM/SW Contact:    Janae Bridgeman, RN Phone Number: 07/19/2023, 3:30 PM   Clinical Narrative: CM met with the patient at the bedside to discuss TOC needs.  The patient lives alone and is agreeable to Home health PT, RN for home health services for strengthening and wound care follow up - Patient was provided with Medicare choice and patient did not have a preference.  I called [facility] and patient was accepted.  HH orders placed for be co-signed by MD.  Patient states that she would also like a referral to the [facility] since [facility] was not accepting patient until February 2025.  Referral placed with [place] [facility] and noted in the AVS.  Patient states that she is no longer with Dr. Cyndia Bent and needs new PCP.  Referral placed with Merri Brunette, MD - noted in AVS.  DME at the home includes rolator, cane shower stool, scale.    No other TOC needs and patient will discharge home when medically stable.     Transition of Care Asessment: Insurance and Status: (P) Insurance coverage has been reviewed Patient has primary care physician: (P) No (Patient requests new PCP - referral placed with Merri Brunette, MD) Home environment has been reviewed: (P) from home alone Prior level of function:: (P) Independent Prior/Current Home Services: (P) No current home services Social Determinants of Health Reivew: (P) SDOH reviewed interventions complete Readmission risk has been reviewed: (P) Yes Transition of care needs: (P) transition of care needs identified, TOC will continue to follow

## 2023-07-19 NOTE — Progress Notes (Signed)
Heart Failure Navigator Progress Note  Assessed for Heart & Vascular TOC clinic readiness.  Patient does not meet criteria due to EF 60-65%, has a scheduled [facility] appointment on 09/07/23. .   Navigator will sign off at this time.   Rhae Hammock, BSN, Scientist, clinical (histocompatibility and immunogenetics) Only

## 2023-07-19 NOTE — Progress Notes (Signed)
No ICM remote transmission received for 07/19/2023 due to currently hospitalized and next ICM transmission scheduled for 08/16/2023.

## 2023-07-19 NOTE — Evaluation (Signed)
Occupational Therapy Evaluation Patient Details Name: Lisa [name] MRN: [id number] DOB: [birth year] Today's Date: 07/19/2023   History of Present Illness Pt is an 82 year old woman admitted on 12/7 with worsening R LE wound x 1 week. + for pseudomonas, CHF exacerbation, L pleural effusion. 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   Clinical Impression   Pt lives alone and drives. She walks with a cane inside and rollator outside. She is modified independent in self care, including wrapping her LEs, and IADLs. Pt presents with generalized weakness and decreased activity tolerance. SpO2 noted to drop to 78% with ambulation. Sats improved to mid 90s with seated rest break and pursed lip breathing. Pt requires up to CGA for ADLs assessed this date and ambulation with rollator. Recommending HHOT, but pt may progress and not require.       If plan is discharge home, recommend the following: A little help with walking and/or transfers;A little help with bathing/dressing/bathroom;Assistance with cooking/housework    Functional Status Assessment  Patient has had a recent decline in their functional status and demonstrates the ability to make significant improvements in function in a reasonable and predictable amount of time.  Equipment Recommendations  None recommended by OT    Recommendations for Other Services       Precautions / Restrictions Precautions Precautions: Fall Precaution Comments: watch O2 Restrictions Weight Bearing Restrictions: No      Mobility Bed Mobility Overal bed mobility: Needs Assistance Bed Mobility: Supine to Sit, Sit to Supine     Supine to sit: Modified independent (Device/Increase time), HOB elevated, Used rails Sit to supine: Min assist   General bed mobility comments: assist for L LE back into bed, pt typically uses a towel to self assist and step stool    Transfers Overall transfer level: Needs  assistance Equipment used: Rollator (4 wheels) Transfers: Sit to/from Stand Sit to Stand: Contact guard assist                  Balance Overall balance assessment: Needs assistance   Sitting balance-Leahy Scale: Good       Standing balance-Leahy Scale: Fair                             ADL either performed or assessed with clinical judgement   ADL Overall ADL's : Needs assistance/impaired Eating/Feeding: Independent   Grooming: Contact guard assist;Standing   Upper Body Bathing: Set up;Sitting   Lower Body Bathing: Contact guard assist;Sit to/from stand   Upper Body Dressing : Set up;Sitting   Lower Body Dressing: Minimal assistance;Sitting/lateral leans Lower Body Dressing Details (indicate cue type and reason): crocs Toilet Transfer: Contact guard assist;Ambulation;Rollator (4 wheels)           Functional mobility during ADLs: Contact guard assist;Rollator (4 wheels)       Vision Baseline Vision/History: 1 Wears glasses Ability to See in Adequate Light: 0 Adequate Patient Visual Report: No change from baseline       Perception         Praxis         Pertinent Vitals/Pain Pain Assessment Pain Assessment: Faces Faces Pain Scale: No hurt     Extremity/Trunk Assessment Upper Extremity Assessment Upper Extremity Assessment: Overall WFL for tasks assessed   Lower Extremity Assessment Lower Extremity Assessment: Defer to PT evaluation   Cervical / Trunk Assessment Cervical / Trunk Assessment: Normal  Communication Communication Communication: No apparent difficulties   Cognition Arousal: Alert Behavior During Therapy: WFL for tasks assessed/performed Overall Cognitive Status: Within Functional Limits for tasks assessed                                       General Comments       Exercises     Shoulder Instructions      Home Living Family/patient expects to be discharged to:: Private residence Living  Arrangements: Alone Available Help at Discharge: Family;Available PRN/intermittently Type of Home: House Home Access: Stairs to enter [employer] of Steps: 3 Entrance Stairs-Rails: Right Home Layout: Two level;Able to live on main level with bedroom/bathroom     Bathroom Shower/Tub: Producer, television/film/video: Standard     Home Equipment: Rollator (4 wheels);Shower seat;Cane - single point          Prior Functioning/Environment Prior Level of Function : Independent/Modified Independent;Driving             Mobility Comments: Amb with SPC in house, rollator out of house ADLs Comments: Drives, ind, has shower chair with no back. Likes to read, cook, go to restaurants. Was managing her LE dressings.        OT Problem List: Decreased activity tolerance;Impaired balance (sitting and/or standing)      OT Treatment/Interventions: Self-care/ADL training;Energy conservation;DME and/or AE instruction;Patient/family education;Balance training;Therapeutic activities    OT Goals(Current goals can be found in the care plan section) Acute Rehab OT Goals OT Goal Formulation: With patient Time For Goal Achievement: 08/02/23 Potential to Achieve Goals: Good ADL Goals Pt Will Perform Grooming: with modified independence;standing (3 activities) Pt Will Perform Lower Body Bathing: with modified independence;sit to/from stand Pt Will Perform Lower Body Dressing: with modified independence;sit to/from stand Pt Will Transfer to Toilet: with modified independence;ambulating;regular height toilet Pt Will Perform Toileting - Clothing Manipulation and hygiene: with modified independence;sit to/from stand Additional ADL Goal #1: Pt will generalize energy conservation strategies and pursed lip breathing in ADLs.  OT Frequency: Min 1X/week    Co-evaluation              AM-PAC OT "6 Clicks" Daily Activity     Outcome Measure Help from another person eating meals?:  None Help from another person taking care of personal grooming?: A Little Help from another person toileting, which includes using toliet, bedpan, or urinal?: A Little Help from another person bathing (including washing, rinsing, drying)?: A Little Help from another person to put on and taking off regular upper body clothing?: A Little Help from another person to put on and taking off regular lower body clothing?: A Little 6 Click Score: 19   End of Session Equipment Utilized During Treatment: Gait belt;Rolling walker (2 wheels) Nurse Communication: Other (comment) (O2 sats with ambulation)  Activity Tolerance: Patient limited by fatigue Patient left: in bed;with call bell/phone within reach;with bed alarm set  OT Visit Diagnosis: Unsteadiness on feet (R26.81);Other abnormalities of gait and mobility (R26.89);Other (comment) (decreased activity tolerance)                Time: 1610-9604 OT Time Calculation (min): 16 min Charges:  OT General Charges $OT Visit: 1 Visit OT Evaluation $OT Eval Moderate Complexity: 1 Mod Berna Spare, OTR/L Acute Rehabilitation Services Office: [phone]  Evern Bio 07/19/2023, 1:13 PM

## 2023-07-20 ENCOUNTER — Inpatient Hospital Stay (HOSPITAL_COMMUNITY): Payer: Medicare Other

## 2023-07-20 DIAGNOSIS — L03115 Cellulitis of right lower limb: Secondary | ICD-10-CM | POA: Diagnosis not present

## 2023-07-20 DIAGNOSIS — I890 Lymphedema, not elsewhere classified: Secondary | ICD-10-CM | POA: Diagnosis not present

## 2023-07-20 DIAGNOSIS — S81801A Unspecified open wound, right lower leg, initial encounter: Secondary | ICD-10-CM | POA: Diagnosis not present

## 2023-07-20 HISTORY — PX: IR THORACENTESIS ASP PLEURAL SPACE W/IMG GUIDE: IMG5380

## 2023-07-20 LAB — BASIC METABOLIC PANEL
Anion gap: 5 (ref 5–15)
BUN: 18 mg/dL (ref 8–23)
CO2: 25 mmol/L (ref 22–32)
Calcium: 8.1 mg/dL — ABNORMAL LOW (ref 8.9–10.3)
Chloride: 106 mmol/L (ref 98–111)
Creatinine, Ser: 1.62 mg/dL — ABNORMAL HIGH (ref 0.44–1.00)
GFR, Estimated: 32 mL/min — ABNORMAL LOW (ref >60)
Glucose, Bld: 119 mg/dL — ABNORMAL HIGH (ref 70–99)
Potassium: 4.1 mmol/L (ref 3.5–5.1)
Sodium: 136 mmol/L (ref 135–145)

## 2023-07-20 LAB — BODY FLUID CELL COUNT WITH DIFFERENTIAL
Eos, Fluid: 0 %
Lymphs, Fluid: 35 %
Monocyte-Macrophage-Serous Fluid: 40 % — ABNORMAL LOW (ref 50–90)
Neutrophil Count, Fluid: 25 % (ref 0–25)
Total Nucleated Cell Count, Fluid: 158 uL (ref 0–1000)

## 2023-07-20 LAB — GLUCOSE, PLEURAL OR PERITONEAL FLUID: Glucose, Fluid: 122 mg/dL

## 2023-07-20 LAB — PROTEIN, PLEURAL OR PERITONEAL FLUID: Total protein, fluid: 3.4 g/dL

## 2023-07-20 LAB — LACTATE DEHYDROGENASE, PLEURAL OR PERITONEAL FLUID: LD, Fluid: 104 U/L — ABNORMAL HIGH (ref 3–23)

## 2023-07-20 MED ORDER — LIDOCAINE HCL 1 % IJ SOLN
10.0000 mL | Freq: Once | INTRAMUSCULAR | Status: DC
  Filled 2023-07-20: qty 10

## 2023-07-20 MED ORDER — LIDOCAINE HCL 1 % IJ SOLN
INTRAMUSCULAR | Status: AC
  Filled 2023-07-20: qty 20

## 2023-07-20 NOTE — Progress Notes (Signed)
PROGRESS NOTE    Lisa [name]  NGE:[id number] DOB: 30-Jun-1941 DOA: 07/17/2023 PCP: Eartha Inch, MD    Brief Narrative:  82 year old with history of hypertension, coronary artery disease, chronic diastolic heart failure, CML, melanoma, chronic lymphedema both legs presented with right leg wound worse for last 1 week.  She had gone to urgent care on Tuesday, culture was taken that revealed Pseudomonas resistant to multiple antibiotics.  Blood cultures were not drawn.  She was asked to go to ER as they could not find any oral options.  Patient also complained of being more short of breath after missing doses of torsemide at home and improved after Lasix.  Subjective:  Patient was seen and examined.  Denies any complaints at rest.  Noted to be hypoxemic on mobility.  Does not use oxygen at home.  Agreeable for thoracentesis.  Assessment & Plan:   Right leg cellulitis with Pseudomonas infection.  Bilateral lymphedema. Presented with increasing redness and erythema.  Multidrug-resistant Pseudomonas from skin culture.  Blood cultures negative.  No oral options available. Continue cefepime until clinical improvement.  Will continue cefepime today. Wound care to see. Mobilize with PT OT. Skin cultures from urgent care reviewed, it was growing multidrug-resistant Pseudomonas, multiple other mixed organisms.  Intermediate sensitivity to cefepime.  Patient is clinically responding however.  So we will continue cefepime today. Local wound care. Wound care clinic follow-up at [facility] on discharge.  Acute on chronic diastolic heart failure: Presented with increasing shortness of breath.  Pleural effusions and atelectasis.  BNP 823.  Also given IV fluids in the ER. Last echocardiogram in 2023 with ejection fraction 45 to 50% Echocardiogram this admission with ejection fraction 60%. Given Lasix with clinical response.  Creatinine 1.68-1.6.  Will resume home dose of torsemide today. Has  significant left-sided pleural effusion and presented with hypoxemia.  Will try therapeutic thoracentesis.  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  Communicated with her oncologist and they recommended to continue Gleevac as patient has localized infection. AKI on CKD stage IIIb, her creatinine ranges from 1.3-1.7.  Recheck. Hypothyroidism: Continue Synthroid. Coronary artery disease, history of V. tach: Patient has an AICD. Amiodarone and aspirin.  Metoprolol.  Continue.   Mobilize.  PT OT.  Thoracentesis.   DVT prophylaxis: enoxaparin (LOVENOX) injection 40 mg Start: 07/18/23 0800   Code Status: Full code Family Communication: None at the bedside Disposition Plan: Status is: Inpatient Remains inpatient appropriate because: IV antibiotics, inpatient procedures     Consultants:  Wound care Infectious disease team, curbside consultation for antibiotic recommendations.  Procedures:  None  Antimicrobials:  Cefepime 12/7---     Objective: Vitals:   07/20/23 0025 07/20/23 0349 07/20/23 0724 07/20/23 1030  BP: (!) 123/49 (!) 121/47 (!) 139/54 134/60  Pulse: 74 73 70   Resp: 18 18 17    Temp: 98.4 F (36.9 C) 98.8 F (37.1 C) 98.1 F (36.7 C)   TempSrc: Oral Oral Oral   SpO2: 95% 97% 93% 94%  Weight:  92.8 kg    Height:        Intake/Output Summary (Last 24 hours) at 07/20/2023 1039 Last data filed at 07/19/2023 1600 Gross per 24 hour  Intake 100 ml  Output --  Net 100 ml   Filed Weights   07/17/23 1029 07/18/23 0500 07/20/23 0349  Weight: 89.8 kg 93.3 kg 92.8 kg    Examination:  General exam: Appears calm and comfortable at rest. Respiratory  system: No added sounds.  Poor air entry at bases.  Poor inspiratory effort. Cardiovascular system: S1 & S2 heard, RRR.  Pacemaker present. Gastrointestinal system: Soft.  Nontender.  Bowel sound present. Central nervous system: Alert and oriented. No focal  neurological deficits. Extremities: Symmetric 5 x 5 power. Skin:  Redness and erythema right anterior leg.  Mostly chronic nonpitting edema both legs. Inflammatory changes improving from before.     Data Reviewed: I have personally reviewed following labs and imaging studies  CBC: Recent Labs  Lab 07/17/23 1210 07/18/23 0701  WBC 13.5* 9.5  HGB 7.7* 7.5*  HCT 24.8* 24.0*  MCV 102.1* 100.8*  PLT 213 155   Basic Metabolic Panel: Recent Labs  Lab 07/17/23 1210 07/18/23 0701 07/19/23 0642 07/20/23 0529  NA 140 137 134* 136  K 3.9 3.8 3.8 4.1  CL 108 108 105 106  CO2 18* 23 21* 25  GLUCOSE 120* 109* 120* 119*  BUN 17 15 20 18   CREATININE 1.37* 1.68* 1.61* 1.62*  CALCIUM 8.7* 7.8* 7.6* 8.1*  MG  --   --  1.8  --   PHOS  --   --  2.9  --    GFR: Estimated Creatinine Clearance: 28.9 mL/min (A) (by C-G formula based on SCr of 1.62 mg/dL (H)). Liver Function Tests: Recent Labs  Lab 07/17/23 1210  AST 23  ALT 17  ALKPHOS 65  BILITOT 1.2*  PROT 6.6  ALBUMIN 3.4*   Recent Labs  Lab 07/17/23 1210  LIPASE 29   No results for input(s): "AMMONIA" in the last 168 hours. Coagulation Profile: No results for input(s): "INR", "PROTIME" in the last 168 hours. Cardiac Enzymes: No results for input(s): "CKTOTAL", "CKMB", "CKMBINDEX", "TROPONINI" in the last 168 hours. BNP (last 3 results) No results for input(s): "PROBNP" in the last 8760 hours. HbA1C: Recent Labs    07/17/23 1546  HGBA1C 5.4   CBG: No results for input(s): "GLUCAP" in the last 168 hours. Lipid Profile: No results for input(s): "CHOL", "HDL", "LDLCALC", "TRIG", "CHOLHDL", "LDLDIRECT" in the last 72 hours. Thyroid Function Tests: Recent Labs    07/17/23 1233  TSH 1.619   Anemia Panel: No results for input(s): "VITAMINB12", "FOLATE", "FERRITIN", "TIBC", "IRON", "RETICCTPCT" in the last 72 hours. Sepsis Labs: Recent Labs  Lab 07/17/23 1219 07/17/23 1604  LATICACIDVEN 2.0* 0.9    Recent  Results (from the past 240 hour(s))  Blood culture (routine x 2)     Status: None (Preliminary result)   Collection Time: 07/17/23 12:10 PM   Specimen: BLOOD RIGHT ARM  Result Value Ref Range Status   Specimen Description BLOOD RIGHT ARM  Final   Special Requests   Final    BOTTLES DRAWN AEROBIC AND ANAEROBIC Blood Culture results may not be optimal due to an inadequate volume of blood received in culture bottles   Culture   Final    NO GROWTH 3 DAYS Performed at [facility], [address], [place], [place] [address]    Report Status PENDING  Incomplete  Blood culture (routine x 2)     Status: None (Preliminary result)   Collection Time: 07/17/23  5:17 PM   Specimen: BLOOD  Result Value Ref Range Status   Specimen Description BLOOD BLOOD LEFT ARM  Final   Special Requests   Final    BOTTLES DRAWN AEROBIC AND ANAEROBIC Blood Culture results may not be optimal due to an inadequate volume of blood received in culture bottles   Culture  Final    NO GROWTH 3 DAYS Performed at [facility], [address], [place], [place] [address]    Report Status PENDING  Incomplete         Radiology Studies: No results found.      Scheduled Meds:  allopurinol  100 mg Oral Daily   amiodarone  200 mg Oral QHS   ascorbic acid  500 mg Oral Daily   aspirin EC  81 mg Oral q AM   atorvastatin  20 mg Oral Daily   enoxaparin (LOVENOX) injection  40 mg Subcutaneous Q24H   imatinib  400 mg Oral QPC supper   lidocaine  10 mL Intradermal Once   metoprolol succinate  12.5 mg Oral QHS   multivitamin with minerals  1 tablet Oral Daily   sodium chloride flush  3 mL Intravenous Q12H   thyroid  15 mg Oral QAC breakfast   Continuous Infusions:  ceFEPime (MAXIPIME) IV 2 g (07/20/23 0936)     LOS: 3 days    Time spent: 40 minutes    Dorcas Carrow, MD [facility]

## 2023-07-20 NOTE — Procedures (Signed)
PROCEDURE SUMMARY:  Successful US guided left thoracentesis. Yielded 1.3 L of clear yellow fluid. Pt tolerated procedure well. No immediate complications.  Specimen sent for labs. CXR ordered; no post-procedure pneumothorax identified.   EBL < 2 mL  Mickie Kay, NP 07/20/2023 12:21 PM

## 2023-07-20 NOTE — Plan of Care (Signed)

## 2023-07-20 NOTE — Telephone Encounter (Signed)
Pt currently in hospital.

## 2023-07-21 DIAGNOSIS — A498 Other bacterial infections of unspecified site: Secondary | ICD-10-CM | POA: Diagnosis not present

## 2023-07-21 DIAGNOSIS — Z1624 Resistance to multiple antibiotics: Secondary | ICD-10-CM | POA: Diagnosis not present

## 2023-07-21 DIAGNOSIS — S81801A Unspecified open wound, right lower leg, initial encounter: Secondary | ICD-10-CM | POA: Diagnosis not present

## 2023-07-21 LAB — CYTOLOGY - NON PAP

## 2023-07-21 NOTE — Progress Notes (Signed)
PT Cancellation Note  Patient Details Name: Lisa [name] MRN: [id number] DOB: 10/08/1940   Cancelled Treatment:    Reason Eval/Treat Not Completed: Patient declined, no reason specified  Refuses PT services, including ambulatory sats. Not open to discussion. Will follow until d/c.   Kathlyn Sacramento, PT, DPT [facility]  Rehabilitation Services Physical Therapist Office: [phone] Website: [email or web address]  Berton Mount 07/21/2023, 9:44 AM

## 2023-07-21 NOTE — Discharge Summary (Signed)
Physician Discharge Summary  Lisa [name] UJW:[id number] DOB: Jul 04, 1941 DOA: 07/17/2023  PCP: Eartha Inch, MD  Admit date: 07/17/2023 Discharge date: 07/21/2023  Admitted From: Home Disposition: Home with home health  Recommendations for Outpatient Follow-up:  Follow up with PCP in 1-2 weeks Please obtain BMP/CBC in one week Follow-up with wound care clinic/lymphedema clinic.  Home Health: PT/OT Equipment/Devices: None  Discharge Condition: Stable CODE STATUS: Full code Diet recommendation: Low-salt diet  Discharge summary: 82 year old with history of hypertension, coronary artery disease, chronic diastolic heart failure, CML, melanoma, previous skin graft on the right leg, chronic lymphedema both legs presented with right leg wound worse for last 1 week. She had gone to urgent care on Tuesday, culture was taken that revealed Pseudomonas resistant to multiple antibiotics. Blood cultures were not drawn. She was asked to go to ER as they could not find any oral options. Patient also complained of being more short of breath after missing doses of torsemide at home.  Patient was treated for multidrug-resistant Pseudomonas infection of her leg and improved with 5 days of cefepime therapy.  Patient was treated with IV diuresis along with thoracentesis on the left side yielding more than 1 L of fluid and improvement of respiratory status.  Going home today.  # Right leg cellulitis with Pseudomonas infection.  Bilateral lymphedema. Presented with increasing redness and erythema.  Multidrug-resistant Pseudomonas from skin culture.  Blood cultures negative.  No oral options available. Patient received 5 days of cefepime therapy.  Her wound has cleared and there is no evidence of ongoing infection.  Local wound care advised. Wound care clinic follow-up at [facility] on discharge.   # Acute on chronic diastolic heart failure: Presented with increasing shortness of breath.  Pleural  effusions and atelectasis.  BNP 823.  Also given IV fluids in the ER. Last echocardiogram in 2023 with ejection fraction 45 to 50% Echocardiogram this admission with ejection fraction 60%. Given Lasix with clinical response.  Creatinine 1.68-1.6.  Will resume home dose of torsemide. Has significant left-sided pleural effusion and presented with hypoxemia.  Patient underwent thoracentesis, more than 900 mL removed.  Transudative.  Clinical recovery after thoracentesis.  Now on room air.   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  Communicated with her oncologist and they recommended to continue Gleevac as patient has localized infection. AKI on CKD stage IIIb, her creatinine ranges from 1.3-1.7.  Recheck in 1 week. Hypothyroidism: Continue Synthroid. Coronary artery disease, history of V. tach: Patient has an AICD. Amiodarone and aspirin.  Metoprolol.  Continue.   Medically stable for discharge.  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  cover wounds with Aquacel Ag+. Top with ABD pads, secure with kerlix and ACE wraps. Change daily   Increase activity slowly   Complete by: As directed  Allergies as of 07/21/2023       Reactions   Flagyl [metronidazole] Itching, Rash, Other (See Comments)   Welts, also   Penicillins Hives, Itching, Rash   Has patient had a PCN reaction causing  immediate rash, facial/tongue/throat swelling, SOB or lightheadedness with hypotension: Yes  Has patient had a PCN reaction causing severe rash involving mucus membranes or skin necrosis: Yes Has patient had a PCN reaction that required hospitalization No Has patient had a PCN reaction occurring within the last 10 years: NO If all of the above answers are "NO", then may proceed with Cephalosporin use.   Sulfamethoxazole Rash   Meperidine Nausea And Vomiting   Morphine And Codeine Nausea And Vomiting   Propoxyphene    Other reaction(s): Unknown   Tape Other (See Comments)   "THE PLASTIC, CLEAR TAPE CAN/DOES PULL OFF MY SKIN."   Doxycycline Nausea Only, Rash, Other (See Comments)   Made her "feel terrible"   Lanolin Itching, Rash   Sulfa Antibiotics Rash        Medication List     STOP taking these medications    cephALEXin 500 MG capsule Commonly known as: KEFLEX       TAKE these medications    acetaminophen 500 MG tablet Commonly known as: TYLENOL Take 500-1,000 mg by mouth every 6 (six) hours as needed for moderate pain.   allopurinol 100 MG tablet Commonly known as: ZYLOPRIM Take 100 mg by mouth daily.   aluminum hydroxide-magnesium carbonate 95-358 MG/15ML Susp Commonly known as: GAVISCON Take 30 mLs by mouth as needed for indigestion or heartburn.   amiodarone 200 MG tablet Commonly known as: PACERONE TAKE ONE TABLET BY MOUTH ONE TIME DAILY   ascorbic acid 500 MG tablet Commonly known as: VITAMIN C Take 500 mg by mouth daily.   aspirin EC 81 MG tablet Take 81 mg by mouth in the morning.   atorvastatin 20 MG tablet Commonly known as: LIPITOR Take 1 tablet (20 mg total) by mouth daily.   b complex vitamins capsule Take 1 capsule by mouth daily.   calcium carbonate 1250 (500 Ca) MG tablet Commonly known as: OS-CAL - dosed in mg of elemental calcium Take 1 tablet by mouth daily as needed (Calcium levels).   imatinib 400 MG tablet Commonly known as:  GLEEVEC Take 400 mg by mouth daily.   loperamide 2 MG capsule Commonly known as: IMODIUM Take 2 mg by mouth as needed for diarrhea or loose stools.   MAGNESIUM GLYCINATE PO Take 1 tablet by mouth daily.   metoprolol succinate 25 MG 24 hr tablet Commonly known as: TOPROL-XL Take one-half tablet by mouth daily   multivitamin with minerals Tabs tablet Take 1 tablet by mouth daily.   nitroGLYCERIN 0.4 MG SL tablet Commonly known as: NITROSTAT Place 1 tablet (0.4 mg total) under the tongue every 5 (five) minutes x 3 doses as needed for chest pain.   potassium chloride 10 MEQ tablet Commonly known as: KLOR-CON Take 1 tablet (10 mEq total) by mouth daily. What changed: how much to take   thyroid 15 MG tablet Commonly known as: ARMOUR Take 15 mg by mouth daily before breakfast.   timolol 0.5 % ophthalmic solution Commonly known as: TIMOPTIC Place 1 drop into the left eye 2 (two) times daily.   torsemide 20 MG tablet Commonly known as: DEMADEX Take 1 tablet (20 mg total) by mouth as directed. What changed: when to take this   VITAMIN B12 SL Place 1 tablet  under the tongue daily.   VITAMIN D-3 PO Take 5,000 Units by mouth daily.               Discharge Care Instructions  (From admission, onward)           Start     Ordered   07/21/23 0000  Discharge wound care:       Comments: Cleanse RLE wounds with soap and water.  cover wounds with Aquacel Ag+. Top with ABD pads, secure with kerlix and ACE wraps. Change daily   07/21/23 0859            Follow-up Information     Merri Brunette, MD Follow up.   Specialty: Family Medicine Why: You are scheduled for primary care follow up.  The next available appointment is scheduled for October 18, 2022 at 2:30 pm/ Contact information: [address] [place] [address] [phone]         [facility] Follow up.   Specialty: Home Health Services Contact information: [address] [place] [address] [phone]         [facility] Follow up.   Why: You are scheduled for [facility]ound Care Center on 07/29/2023 at 2:45.               Allergies  Allergen Reactions   Flagyl [Metronidazole] Itching, Rash and Other (See Comments)    Welts, also   Penicillins Hives, Itching and Rash    Has patient had a PCN reaction causing immediate rash, facial/tongue/throat swelling, SOB or lightheadedness with hypotension: Yes  Has patient had a PCN reaction causing severe rash involving mucus membranes or skin necrosis: Yes Has patient had a PCN reaction that required hospitalization No Has patient had a PCN reaction occurring within the last 10 years: NO If all of the above answers are "NO", then may proceed with Cephalosporin use.    Sulfamethoxazole Rash   Meperidine Nausea And Vomiting   Morphine And Codeine Nausea And Vomiting   Propoxyphene     Other reaction(s): Unknown   Tape Other (See Comments)    "THE PLASTIC, CLEAR TAPE CAN/DOES PULL OFF MY SKIN."   Doxycycline Nausea Only, Rash and Other (See Comments)    Made her "feel terrible"   Lanolin Itching and Rash   Sulfa Antibiotics Rash    Consultations: None   Procedures/Studies: IR THORACENTESIS ASP PLEURAL SPACE W/IMG GUIDE  Result Date: 07/20/2023 INDICATION: Patient with a history of heart failure, CML and melanoma presents today with a left pleural effusion. Interventional radiology asked to perform a diagnostic and therapeutic thoracentesis. EXAM: ULTRASOUND GUIDED THORACENTESIS MEDICATIONS: 1% lidocaine 10 mL COMPLICATIONS: None immediate. PROCEDURE: An ultrasound guided thoracentesis was thoroughly discussed with the patient and questions answered. The benefits, risks, alternatives and complications were also discussed. The patient understands and wishes to proceed with the procedure. Written consent was obtained. Ultrasound was performed to localize and mark an  adequate pocket of fluid in the left chest. The area was then prepped and draped in the normal sterile fashion. 1% Lidocaine was used for local anesthesia. Under ultrasound guidance a 6 Fr Safe-T-Centesis catheter was introduced. Thoracentesis was performed. The catheter was removed and a dressing applied. FINDINGS: A total of approximately 1.3 L of clear yellow fluid was removed. Samples were sent to the laboratory as requested by the clinical team. IMPRESSION: Successful ultrasound guided left thoracentesis yielding 1.3 L of pleural fluid. Procedure performed  by Alwyn Ren NP Electronically Signed   By: Richarda Overlie M.D.   On: 07/20/2023 13:51   [place] CHEST (PLEURAL EFFUSION)  Result Date: 07/20/2023 CLINICAL DATA:  Contralateral assessment done at the time of left thoracentesis EXAM: CHEST ULTRASOUND COMPARISON:  CT 06/16/2022 FINDINGS: Trace right pleural  fluid identified IMPRESSION: Trace right pleural effusion Electronically Signed   By: Corlis Leak M.D.   On: 07/20/2023 13:01   DG Chest 1 View  Result Date: 07/20/2023 CLINICAL DATA:  Status post left thoracentesis. EXAM: CHEST  1 VIEW COMPARISON:  Chest x-ray dated July 17, 2023. FINDINGS: Unchanged left chest wall pacemaker. Stable cardiomegaly. Decreased now small to moderate left pleural effusion with improved aeration of the left lung. No pneumothorax. IMPRESSION: 1. Decreased now small to moderate left pleural effusion with improved aeration of the left lung. No pneumothorax. Electronically Signed   By: Obie Dredge M.D.   On: 07/20/2023 12:19   ECHOCARDIOGRAM COMPLETE  Result Date: 07/18/2023    ECHOCARDIOGRAM REPORT   Patient Name:   Lisa [name] Date of Exam: 07/18/2023 Medical Rec #:  [id number]          Height:       62.0 in Accession #:    [id number]         Weight:       205.7 lb Date of Birth:  1941/03/10           BSA:          1.935 m Patient Age:    82 years           BP:           111/58 mmHg Patient Gender: F                   HR:           75 bpm. Exam Location:  Inpatient Procedure: 2D Echo, Cardiac Doppler and Color Doppler Indications:    CHF  History:        Patient has prior history of Echocardiogram examinations, most                 recent 06/18/2022. CHF, Arrythmias:PVC; Risk                 Factors:Hypertension.  Sonographer:    Darlys Gales Referring Phys: [id number] RONDELL A SMITH IMPRESSIONS  1. Left ventricular ejection fraction, by estimation, is 60 to 65%. The left ventricle has normal function. The left ventricle has no regional wall motion abnormalities. Left ventricular diastolic parameters were normal.  2. Right ventricular systolic function is normal. The right ventricular size is normal.  3. Moderate effusion anterior to LV as well as apex ? organized at apex with echo brite rellections consider f/u chest CTA to further characterize This may reflect left pleuaral effusion with atelectasis of lung which is also present . Moderate pericardial effusion.  4. The mitral valve is abnormal. Trivial mitral valve regurgitation. No evidence of mitral stenosis.  5. The aortic valve is tricuspid. There is moderate calcification of the aortic valve. There is mild thickening of the aortic valve. Aortic valve regurgitation is not visualized. Mild aortic valve stenosis.  6. The inferior vena cava is normal in size with greater than 50% respiratory variability, suggesting right atrial pressure of 3 mmHg. FINDINGS  Left Ventricle: Left ventricular ejection fraction, by estimation, is 60 to 65%. The left ventricle has normal function. The left ventricle has no regional wall  motion abnormalities. The left ventricular internal cavity size was normal in size. There is  no left ventricular hypertrophy. Left ventricular diastolic parameters were normal. Right Ventricle: The right ventricular size is normal. No increase in right ventricular wall thickness. Right ventricular systolic function is normal. Left Atrium: Left atrial size was  normal in size. Right Atrium: Right atrial size was normal in size. Pericardium: Moderate effusion anterior to LV as well as apex ? organized at apex with echo brite rellections consider f/u chest CTA to further characterize This may reflect left pleuaral effusion with atelectasis of lung which is also present. A moderately sized pericardial effusion is present. Mitral Valve: The mitral valve is abnormal. There is moderate thickening of the mitral valve leaflet(s). Trivial mitral valve regurgitation. No evidence of mitral valve stenosis. Tricuspid Valve: The tricuspid valve is normal in structure. Tricuspid valve regurgitation is trivial. No evidence of tricuspid stenosis. Aortic Valve: The aortic valve is tricuspid. There is moderate calcification of the aortic valve. There is mild thickening of the aortic valve. Aortic valve regurgitation is not visualized. Mild aortic stenosis is present. Aortic valve mean gradient measures 11.0 mmHg. Aortic valve peak gradient measures 18.3 mmHg. Aortic valve area, by VTI measures 1.59 cm. Pulmonic Valve: The pulmonic valve was normal in structure. Pulmonic valve regurgitation is trivial. No evidence of pulmonic stenosis. Aorta: The aortic root is normal in size and structure. Venous: The inferior vena cava is normal in size with greater than 50% respiratory variability, suggesting right atrial pressure of 3 mmHg. IAS/Shunts: The interatrial septum appears to be lipomatous. No atrial level shunt detected by color flow Doppler.  LEFT VENTRICLE PLAX 2D LVIDd:         5.30 cm   Diastology LVIDs:         3.90 cm   LV e' medial:    8.38 cm/s LV PW:         1.10 cm   LV E/e' medial:  11.5 LV IVS:        1.00 cm   LV e' lateral:   8.59 cm/s LVOT diam:     1.80 cm   LV E/e' lateral: 11.2 LV SV:         75 LV SV Index:   39 LVOT Area:     2.54 cm  RIGHT VENTRICLE RV S prime:     12.30 cm/s TAPSE (M-mode): 3.5 cm LEFT ATRIUM             Index        RIGHT ATRIUM          Index LA Vol  (A2C):   85.2 ml 44.04 ml/m  RA Area:     8.40 cm LA Vol (A4C):   62.8 ml 32.46 ml/m  RA Volume:   12.80 ml 6.62 ml/m LA Biplane Vol: 73.1 ml 37.79 ml/m  AORTIC VALVE AV Area (Vmax):    1.64 cm AV Area (Vmean):   1.67 cm AV Area (VTI):     1.59 cm AV Vmax:           214.00 cm/s AV Vmean:          157.000 cm/s AV VTI:            0.468 m AV Peak Grad:      18.3 mmHg AV Mean Grad:      11.0 mmHg LVOT Vmax:         138.00 cm/s LVOT Vmean:  103.000 cm/s LVOT VTI:          0.293 m LVOT/AV VTI ratio: 0.63 MITRAL VALVE MV Area (PHT): 3.58 cm     SHUNTS MV Decel Time: 212 msec     Systemic VTI:  0.29 m MV E velocity: 96.40 cm/s   Systemic Diam: 1.80 cm MV A velocity: 101.00 cm/s MV E/A ratio:  0.95 Charlton Haws MD Electronically signed by Charlton Haws MD Signature Date/Time: 07/18/2023/10:29:54 AM    Final    VAS [place] LOWER EXTREMITY VENOUS (DVT) (7a-7p)  Result Date: 07/18/2023  Lower Venous DVT Study Patient Name:  Lisa [name]  Date of Exam:   07/17/2023 Medical Rec #: [id number]           Accession #:    [id number] Date of Birth: February 24, 1941            Patient Gender: F Patient Age:   25 years Exam Location:  [facility] Procedure:      VAS [place] LOWER EXTREMITY VENOUS (DVT) Referring Phys: Claudette Stapler --------------------------------------------------------------------------------  Indications: Pain, Edema, Erythema, and Lymphedema, weeping.  Risk Factors: Venous reflux disease. Limitations: Body habitus and bandages. Comparison Study: No prior LEV on file Performing Technologist: Sherren Kerns RVS  Examination Guidelines: A complete evaluation includes B-mode imaging, spectral Doppler, color Doppler, and power Doppler as needed of all accessible portions of each vessel. Bilateral testing is considered an integral part of a complete examination. Limited examinations for reoccurring indications may be performed as noted. The reflux portion of the exam is performed with the patient in reverse  Trendelenburg.  +---------+---------------+---------+-----------+---------------+--------------+ RIGHT    CompressibilityPhasicitySpontaneityProperties     Thrombus Aging +---------+---------------+---------+-----------+---------------+--------------+ CFV      Full           Yes      No         pulsatile                                                                 waveforms                     +---------+---------------+---------+-----------+---------------+--------------+ SFJ      Full                                                             +---------+---------------+---------+-----------+---------------+--------------+ FV Prox  Full                                                             +---------+---------------+---------+-----------+---------------+--------------+ FV Mid   Full           Yes      No         pulsatile  waveforms                     +---------+---------------+---------+-----------+---------------+--------------+ FV Distal               Yes      No         pulsatile                                                                 waveforms                     +---------+---------------+---------+-----------+---------------+--------------+ PFV      Full                                                             +---------+---------------+---------+-----------+---------------+--------------+ POP      Full           Yes      No         pulsatile                                                                 waveforms                     +---------+---------------+---------+-----------+---------------+--------------+ PTV                                                        Not well                                                                  visualized      +---------+---------------+---------+-----------+---------------+--------------+ PERO                                                       Not well                                                                  visualized     +---------+---------------+---------+-----------+---------------+--------------+   +---------+---------------+---------+-----------+---------------+--------------+ LEFT     CompressibilityPhasicitySpontaneityProperties     Thrombus Aging +---------+---------------+---------+-----------+---------------+--------------+  CFV      Full           Yes      No         pulsatile                                                                 waveforms                     +---------+---------------+---------+-----------+---------------+--------------+ SFJ      Full                                                             +---------+---------------+---------+-----------+---------------+--------------+ FV Prox  Full                                                             +---------+---------------+---------+-----------+---------------+--------------+ FV Mid   Full                                                             +---------+---------------+---------+-----------+---------------+--------------+ FV DistalFull                                                             +---------+---------------+---------+-----------+---------------+--------------+ PFV      Full                                                             +---------+---------------+---------+-----------+---------------+--------------+ POP      Full           Yes      No         pulsatile                                                                 waveforms                     +---------+---------------+---------+-----------+---------------+--------------+ PTV  Not well                                                                   visualized     +---------+---------------+---------+-----------+---------------+--------------+ PERO                                                       Not well                                                                  visualized     +---------+---------------+---------+-----------+---------------+--------------+     Summary: RIGHT: - There is no evidence of deep vein thrombosis in the lower extremity. However, portions of this examination were limited- see technologist comments above.  - No cystic structure found in the popliteal fossa.  LEFT: - There is no evidence of deep vein thrombosis in the lower extremity. However, portions of this examination were limited- see technologist comments above.  - No cystic structure found in the popliteal fossa.  *See table(s) above for measurements and observations. Electronically signed by Carolynn Sayers on 07/18/2023 at 8:58:32 AM.    Final    US Abdomen Limited RUQ (LIVER/GB)  Result Date: 07/17/2023 CLINICAL DATA:  Right upper quadrant pain EXAM: ULTRASOUND ABDOMEN LIMITED RIGHT UPPER QUADRANT COMPARISON:  None Available. FINDINGS: Gallbladder: No gallstones or wall thickening visualized. No sonographic Murphy sign noted by sonographer. Common bile duct: Diameter: 2.8 mm Liver: No focal liver abnormality. Increased parenchymal echogenicity of the liver. Portal vein is patent on color Doppler imaging with normal direction of blood flow towards the liver. Other: None. IMPRESSION: 1. No acute findings. 2. Increased parenchymal echogenicity of the liver is a nonspecific finding but is most commonly seen with fatty infiltration of the liver. Electronically Signed   By: Signa Kell M.D.   On: 07/17/2023 13:20   DG Chest Portable 1 View  Result Date: 07/17/2023 CLINICAL DATA:  Shortness of breath. EXAM: PORTABLE CHEST 1 VIEW COMPARISON:  06/16/2022 FINDINGS: AP portable radiograph.   Pacer/ICD again identified. Numerous leads and wires project over the chest. Midline trachea. Moderate to marked cardiomegaly. No pneumothorax. Opacity throughout the inferior left hemithorax likely represents a large left pleural effusion with lower lung predominant airspace disease. Underlying mild interstitial prominence and indistinctness. Clear right lung. IMPRESSION: Large left pleural effusion with adjacent airspace disease, most likely atelectasis. Cannot exclude infection or aspiration. Cardiomegaly and mild congestive heart failure. Aortic Atherosclerosis (ICD10-I70.0). Electronically Signed   By: Jeronimo Greaves M.D.   On: 07/17/2023 13:13   CUP PACEART REMOTE DEVICE CHECK  Result Date: 07/01/2023 Scheduled remote reviewed. Normal device function.  The device estimates 5 months until ERI Next remote 07/19/2023. ML, CVRS  (Echo, Carotid, EGD, Colonoscopy, ERCP)    Subjective: Patient seen in the morning rounds.  Wound examined.  Patient denies any complaints.  She remained on room air  after thoracentesis.  She is eager to go home as she has some housekeeping staff to meet.   Discharge Exam: Vitals:   07/21/23 0403 07/21/23 0738  BP: (!) 131/44 (!) 122/51  Pulse: 76 79  Resp: 18 18  Temp: 98.6 F (37 C) 98.2 F (36.8 C)  SpO2: 92% 93%   Vitals:   07/20/23 2002 07/20/23 2214 07/21/23 0403 07/21/23 0738  BP: (!) 102/48 (!) 124/45 (!) 131/44 (!) 122/51  Pulse: 79 75 76 79  Resp: 18  18 18   Temp: 98.9 F (37.2 C)  98.6 F (37 C) 98.2 F (36.8 C)  TempSrc: Oral  Oral Oral  SpO2: 96% 90% 92% 93%  Weight:   95 kg   Height:        General: Pt is alert, awake, not in acute distress.  On room air. Cardiovascular: RRR, S1/S2 +, no rubs, no gallops, AICD left precordium. Respiratory: CTA bilaterally, no wheezing, no rhonchi, no added sounds.  Poor air entry on the left base. Abdominal: Soft, NT, ND, bowel sounds + Extremities: Chronic lymphedema.  She has a scar on her right  anterior leg which is chronic.  Redness and erythema has improved and almost faded.  Picture in the chart.  There is a picture in the chart from admission for comparison.      The results of significant diagnostics from this hospitalization (including imaging, microbiology, ancillary and laboratory) are listed below for reference.     Microbiology: Recent Results (from the past 240 hour(s))  Blood culture (routine x 2)     Status: None (Preliminary result)   Collection Time: 07/17/23 12:10 PM   Specimen: BLOOD RIGHT ARM  Result Value Ref Range Status   Specimen Description BLOOD RIGHT ARM  Final   Special Requests   Final    BOTTLES DRAWN AEROBIC AND ANAEROBIC Blood Culture results may not be optimal due to an inadequate volume of blood received in culture bottles   Culture   Final    NO GROWTH 4 DAYS Performed at [facility], [address] [place], [place] [address]    Report Status PENDING  Incomplete  Blood culture (routine x 2)     Status: None (Preliminary result)   Collection Time: 07/17/23  5:17 PM   Specimen: BLOOD  Result Value Ref Range Status   Specimen Description BLOOD BLOOD LEFT ARM  Final   Special Requests   Final    BOTTLES DRAWN AEROBIC AND ANAEROBIC Blood Culture results may not be optimal due to an inadequate volume of blood received in culture bottles   Culture   Final    NO GROWTH 4 DAYS Performed at [facility], [address], [place], [place] [address]    Report Status PENDING  Incomplete     Labs: BNP (last 3 results) Recent Labs    07/17/23 1210  BNP 803.7*   Basic Metabolic Panel: Recent Labs  Lab 07/17/23 1210 07/18/23 0701 07/19/23 0642 07/20/23 0529  NA 140 137 134* 136  K 3.9 3.8 3.8 4.1  CL 108 108 105 106  CO2 18* 23 21* 25  GLUCOSE 120* 109* 120* 119*  BUN 17 15 20 18   CREATININE 1.37* 1.68* 1.61* 1.62*  CALCIUM 8.7* 7.8* 7.6* 8.1*  MG  --   --  1.8  --   PHOS  --   --  2.9  --    Liver Function  Tests: Recent Labs  Lab 07/17/23 1210  AST 23  ALT 17  ALKPHOS 65  BILITOT 1.2*  PROT 6.6  ALBUMIN 3.4*   Recent Labs  Lab 07/17/23 1210  LIPASE 29   No results for input(s): "AMMONIA" in the last 168 hours. CBC: Recent Labs  Lab 07/17/23 1210 07/18/23 0701  WBC 13.5* 9.5  HGB 7.7* 7.5*  HCT 24.8* 24.0*  MCV 102.1* 100.8*  PLT 213 155   Cardiac Enzymes: No results for input(s): "CKTOTAL", "CKMB", "CKMBINDEX", "TROPONINI" in the last 168 hours. BNP: Invalid input(s): "POCBNP" CBG: No results for input(s): "GLUCAP" in the last 168 hours. D-Dimer No results for input(s): "DDIMER" in the last 72 hours. Hgb A1c No results for input(s): "HGBA1C" in the last 72 hours. Lipid Profile No results for input(s): "CHOL", "HDL", "LDLCALC", "TRIG", "CHOLHDL", "LDLDIRECT" in the last 72 hours. Thyroid function studies No results for input(s): "TSH", "T4TOTAL", "T3FREE", "THYROIDAB" in the last 72 hours.  Invalid input(s): "FREET3" Anemia work up No results for input(s): "VITAMINB12", "FOLATE", "FERRITIN", "TIBC", "IRON", "RETICCTPCT" in the last 72 hours. Urinalysis    Component Value Date/Time   COLORURINE YELLOW 06/17/2022 0608   APPEARANCEUR HAZY (A) 06/17/2022 0608   LABSPEC 1.018 06/17/2022 0608   PHURINE 5.0 06/17/2022 0608   GLUCOSEU NEGATIVE 06/17/2022 0608   HGBUR NEGATIVE 06/17/2022 0608   BILIRUBINUR NEGATIVE 06/17/2022 0608   BILIRUBINUR negative 06/04/2016 0847   BILIRUBINUR neg 02/14/2015 1431   KETONESUR NEGATIVE 06/17/2022 0608   PROTEINUR NEGATIVE 06/17/2022 0608   UROBILINOGEN 0.2 06/04/2016 0847   NITRITE NEGATIVE 06/17/2022 0608   LEUKOCYTESUR NEGATIVE 06/17/2022 0608   Sepsis Labs Recent Labs  Lab 07/17/23 1210 07/18/23 0701  WBC 13.5* 9.5   Microbiology Recent Results (from the past 240 hour(s))  Blood culture (routine x 2)     Status: None (Preliminary result)   Collection Time: 07/17/23 12:10 PM   Specimen: BLOOD RIGHT ARM  Result  Value Ref Range Status   Specimen Description BLOOD RIGHT ARM  Final   Special Requests   Final    BOTTLES DRAWN AEROBIC AND ANAEROBIC Blood Culture results may not be optimal due to an inadequate volume of blood received in culture bottles   Culture   Final    NO GROWTH 4 DAYS Performed at [facility], [address], [place], [place] [address]    Report Status PENDING  Incomplete  Blood culture (routine x 2)     Status: None (Preliminary result)   Collection Time: 07/17/23  5:17 PM   Specimen: BLOOD  Result Value Ref Range Status   Specimen Description BLOOD BLOOD LEFT ARM  Final   Special Requests   Final    BOTTLES DRAWN AEROBIC AND ANAEROBIC Blood Culture results may not be optimal due to an inadequate volume of blood received in culture bottles   Culture   Final    NO GROWTH 4 DAYS Performed at [facility], [address] [place], [place] [address]    Report Status PENDING  Incomplete     Time coordinating discharge: 35 minutes  SIGNED:   Dorcas Carrow, MD  [facility] 07/21/2023, 10:54 AM

## 2023-07-21 NOTE — Plan of Care (Signed)

## 2023-07-21 NOTE — Progress Notes (Signed)
OT Cancellation Note  Patient Details Name: Lisa [name] MRN: [id number] DOB: 1941/02/07   Cancelled Treatment:    Reason Eval/Treat Not Completed: Patient declined, wants to focus on discharging as soon as possible. Pt is declining OT upon discharge as well.   Evern Bio 07/21/2023, 9:33 AM Berna Spare, OTR/L Acute Rehabilitation Services Office: [phone]

## 2023-07-22 LAB — CULTURE, BLOOD (ROUTINE X 2)
Culture: NO GROWTH
Culture: NO GROWTH

## 2023-07-26 NOTE — Progress Notes (Signed)
Remote ICD transmission.   

## 2023-07-27 ENCOUNTER — Ambulatory Visit: Payer: Medicare Other | Attending: Internal Medicine | Admitting: Internal Medicine

## 2023-07-27 VITALS — BP 120/62 | HR 80 | Ht 62.0 in | Wt 206.2 lb

## 2023-07-27 DIAGNOSIS — I4901 Ventricular fibrillation: Secondary | ICD-10-CM | POA: Diagnosis not present

## 2023-07-27 LAB — CUP PACEART INCLINIC DEVICE CHECK
Brady Statistic RA Percent Paced: 0.3 %
Date Time Interrogation Session: 20241217170205
HighPow Impedance: 39 Ohm
Implantable Lead Connection Status: 753985
Implantable Lead Connection Status: 753985
Implantable Lead Connection Status: 753985
Implantable Lead Implant Date: 20170612
Implantable Lead Implant Date: 20170612
Implantable Lead Implant Date: 20170612
Implantable Lead Location: 753858
Implantable Lead Location: 753859
Implantable Lead Location: 753860
Implantable Lead Model: 4398
Implantable Lead Model: 5076
Implantable Pulse Generator Implant Date: 20170612
Lead Channel Impedance Value: 342 Ohm
Lead Channel Impedance Value: 418 Ohm
Lead Channel Impedance Value: 646 Ohm
Lead Channel Pacing Threshold Amplitude: 1 V
Lead Channel Pacing Threshold Amplitude: 1.25 V
Lead Channel Pacing Threshold Amplitude: 1.25 V
Lead Channel Pacing Threshold Pulse Width: 0.4 ms
Lead Channel Pacing Threshold Pulse Width: 0.4 ms
Lead Channel Pacing Threshold Pulse Width: 0.8 ms
Lead Channel Sensing Intrinsic Amplitude: 1.9 mV
Lead Channel Sensing Intrinsic Amplitude: 7 mV
Lead Channel Setting Pacing Amplitude: 1.75 V
Lead Channel Setting Pacing Amplitude: 2 V
Lead Channel Setting Pacing Amplitude: 2.5 V
Lead Channel Setting Pacing Pulse Width: 0.4 ms
Lead Channel Setting Pacing Pulse Width: 0.8 ms

## 2023-07-27 MED ORDER — AMIODARONE HCL 200 MG PO TABS: 200.0000 mg | ORAL_TABLET | Freq: Every day | ORAL | 3 refills | Status: DC

## 2023-07-27 NOTE — Progress Notes (Unsigned)
Appointment with APP once ERI.

## 2023-07-27 NOTE — Progress Notes (Unsigned)
HPI [name] returns today for followup. She is a pleasant morbidly obese 82 yo woman with chronic systolic heart failure, PAF, and VT. She is s/p Biv ICD insertion. She remains on amiodarone 200 mg daily. She is fairly sedentary. No additional syncope or ICD therapies. 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 She has gotten a new leg wrap which appears to be helping some. She was in the hospital a few weeks ago with a psuedomonas infection.  Allergies  Allergen Reactions   Flagyl [Metronidazole] Itching, Rash and Other (See Comments)    Welts, also   Penicillins Hives, Itching and Rash    Has patient had a PCN reaction causing immediate rash, facial/tongue/throat swelling, SOB or lightheadedness with hypotension: Yes  Has patient had a PCN reaction causing severe rash involving mucus membranes or skin necrosis: Yes Has patient had a PCN reaction that required hospitalization No Has patient had a PCN reaction occurring within the last 10 years: NO If all of the above answers are "NO", then may proceed with Cephalosporin use.    Sulfamethoxazole Rash   Meperidine Nausea And Vomiting   Morphine And Codeine Nausea And Vomiting   Propoxyphene     Other reaction(s): Unknown   Tape Other (See Comments)    "THE PLASTIC, CLEAR TAPE CAN/DOES PULL OFF MY SKIN."   Doxycycline Nausea Only, Rash and Other (See Comments)    Made her "feel terrible"   Lanolin Itching and Rash   Sulfa Antibiotics Rash     Current Outpatient Medications  Medication Sig Dispense Refill   acetaminophen (TYLENOL) 500 MG tablet Take 500-1,000 mg by mouth every 6 (six) hours as needed for moderate pain.     allopurinol (ZYLOPRIM) 100 MG tablet Take 100 mg by mouth daily.      aluminum hydroxide-magnesium carbonate (GAVISCON) 95-358 MG/15ML SUSP Take 30 mLs by mouth as needed for indigestion or heartburn.     aspirin EC 81 MG tablet Take 81 mg by mouth in the morning.     atorvastatin  (LIPITOR) 20 MG tablet Take 1 tablet (20 mg total) by mouth daily. 90 tablet 3   b complex vitamins capsule Take 1 capsule by mouth daily.     calcium carbonate (OS-CAL - DOSED IN MG OF ELEMENTAL CALCIUM) 1250 (500 Ca) MG tablet Take 1 tablet by mouth daily as needed (Calcium levels).     Cholecalciferol (VITAMIN D-3 PO) Take 5,000 Units by mouth daily.     Cyanocobalamin (VITAMIN B12 SL) Place 1 tablet under the tongue daily.     imatinib (GLEEVEC) 400 MG tablet Take 400 mg by mouth daily.     loperamide (IMODIUM) 2 MG capsule Take 2 mg by mouth as needed for diarrhea or loose stools.     MAGNESIUM GLYCINATE PO Take 1 tablet by mouth daily.     metoprolol succinate (TOPROL-XL) 25 MG 24 hr tablet Take one-half tablet by mouth daily 45 tablet 0   Multiple Vitamin (MULTIVITAMIN WITH MINERALS) TABS tablet Take 1 tablet by mouth daily.     nitroGLYCERIN (NITROSTAT) 0.4 MG SL tablet Place 1 tablet (0.4 mg total) under the tongue every 5 (five) minutes x 3 doses as needed for chest pain. 25 tablet 3   potassium chloride (KLOR-CON) 10 MEQ tablet Take 1 tablet (10 mEq total) by mouth daily. (Patient taking differently: Take 20 mEq by mouth daily.) 90 tablet 3   thyroid (ARMOUR) 15 MG tablet Take 15  mg by mouth daily before breakfast.      timolol (TIMOPTIC) 0.5 % ophthalmic solution Place 1 drop into the left eye 2 (two) times daily.     torsemide (DEMADEX) 20 MG tablet Take 1 tablet (20 mg total) by mouth as directed. (Patient taking differently: Take 20 mg by mouth daily.) 90 tablet 0   vitamin C (ASCORBIC ACID) 500 MG tablet Take 500 mg by mouth daily.     amiodarone (PACERONE) 200 MG tablet Take 1 tablet (200 mg total) by mouth daily. 90 tablet 3   No current facility-administered medications for this visit.     Past Medical History:  Diagnosis Date   CAD in native artery 2017   stent to LAD   Cardiac arrest (HCC) 09/2015   CHF (congestive heart failure) (HCC)    CML (chronic myelocytic  leukemia) (HCC)    Complication of anesthesia    Hx: UTI (urinary tract infection)    Hypertension    Melanoma (HCC)    PONV (postoperative nausea and vomiting)    PVC (premature ventricular contraction)    HISTORY OF   Thyroid disease    V tach (HCC)     ROS:   All systems reviewed and negative except as noted in the HPI.   Past Surgical History:  Procedure Laterality Date   ABDOMINAL HYSTERECTOMY     APPENDECTOMY     CARDIAC CATHETERIZATION N/A 10/03/2015   Procedure: Left Heart Cath and Coronary Angiography;  Surgeon: Peter M Swaziland, MD;  Location: [facility] INVASIVE CV LAB;  Service: Cardiovascular;  Laterality: N/A;   CARDIAC CATHETERIZATION N/A 10/07/2015   Procedure: Coronary Stent Intervention;  Surgeon: Kathleene Hazel, MD;  Location: [facility] INVASIVE CV LAB;  Service: Cardiovascular;  Laterality: N/A;   CARDIAC CATHETERIZATION  09/22/2019   EP IMPLANTABLE DEVICE N/A 01/20/2016   Procedure: BiV ICD Insertion CRT-D;  Surgeon: Marinus Maw, MD;  Location: [facility] INVASIVE CV LAB;  Service: Cardiovascular;  Laterality: N/A;   IR THORACENTESIS ASP PLEURAL SPACE W/IMG GUIDE  07/20/2023   LEFT HEART CATH AND CORONARY ANGIOGRAPHY N/A 09/22/2019   Procedure: LEFT HEART CATH AND CORONARY ANGIOGRAPHY;  Surgeon: Swaziland, Peter M, MD;  Location: [facility] INVASIVE CV LAB;  Service: Cardiovascular;  Laterality: N/A;   TONSILLECTOMY     TONSILLECTOMY     TUBAL LIGATION       Family History  Problem Relation Age of Onset   Coronary artery disease Mother    Thyroid disease Mother    Multiple myeloma Father    Heart attack Maternal Grandfather    Heart attack Paternal Grandfather      Social History   Socioeconomic History   Marital status: Divorced    Spouse name: Not on file   Number of children: Not on file   Years of education: Not on file   Highest education level: Not on file  Occupational History   Not on file  Tobacco Use   Smoking status: Never   Smokeless tobacco: Never   Vaping Use   Vaping status: Never Used  Substance and Sexual Activity   Alcohol use: No    Alcohol/week: 0.0 standard drinks of alcohol   Drug use: No   Sexual activity: Not on file  Other Topics Concern   Not on file  Social History Narrative   Not on file   Social Drivers of Health   Financial Resource Strain: Low Risk  (10/28/2022)   Received [facility], [facility]  Overall Financial Resource Strain (CARDIA)    Difficulty of Paying Living Expenses: Not hard at all  Food Insecurity: No Food Insecurity (07/17/2023)   Hunger Vital Sign    Worried About Running Out of Food in the Last Year: Never true    Ran Out of Food in the Last Year: Never true  Transportation Needs: No Transportation Needs (07/17/2023)   PRAPARE - Administrator, Civil Service (Medical): No    Lack of Transportation (Non-Medical): No  Physical Activity: Not on file  Stress: Not on file  Social Connections: Unknown (04/27/2022)   Received from [facility], [facility]   Social Network    Social Network: Not on file  Intimate Partner Violence: Not At Risk (07/17/2023)   Humiliation, Afraid, Rape, and Kick questionnaire    Fear of Current or Ex-Partner: No    Emotionally Abused: No    Physically Abused: No    Sexually Abused: No     BP 120/62   Pulse 80   Ht 5\' 2"  (1.575 m)   Wt 206 lb 3.2 oz (93.5 kg)   LMP 02/14/1995   SpO2 91%   BMI 37.71 kg/m   Physical Exam:  Well appearing NAD HEENT: Unremarkable Neck:  No JVD, no thyromegally Lymphatics:  No adenopathy Back:  No CVA tenderness Lungs:  Clear with no wheezes HEART:  Regular rate rhythm, no murmurs, no rubs, no clicks Abd:  soft, positive bowel sounds, no organomegally, no rebound, no guarding Ext:  2 plus pulses, no edema, no cyanosis, no clubbing Skin:  No rashes no nodules Neuro:  CN II through XII intact, motor grossly intact  DEVICE  Normal device function.  See PaceArt for details.    Assess/Plan:  VF - she has had no episodes since I saw her last . Continue amiodarone. PAF - she has not had any. Continue current meds. Chronic systolic heart failure - her symptoms remain class 2. We discussed elevation of the legs. Her dose of diuretics is limited by CRI. ICD - her medtronic biv ICD is working normally. No change. She is approaching ERI in about 4 months.   Lisa [name] Meric Joye,MD

## 2023-07-27 NOTE — Patient Instructions (Addendum)
Medication Instructions:  Your physician recommends that you continue on your current medications as directed. Please refer to the Current Medication list given to you today.  *If you need a refill on your cardiac medications before your next appointment, please call your pharmacy*  Lab Work: None ordered.  If you have labs (blood work) drawn today and your tests are completely normal, you will receive your results only by: MyChart Message (if you have MyChart) OR A paper copy in the mail If you have any lab test that is abnormal or we need to change your treatment, we will call you to review the results.  Testing/Procedures: None ordered.  Follow-Up: At [facility], you and your health needs are our priority.  As part of our continuing mission to provide you with exceptional heart care, we have created designated Provider Care Teams.  These Care Teams include your primary Cardiologist (physician) and Advanced Practice Providers (APPs -  Physician Assistants and Nurse Practitioners) who all work together to provide you with the care you need, when you need it.  Your next appointment:   Will set up appointment once device has reached battery end.  The format for your next appointment:   In Person  Provider:   Advanced Practice Providers on your designated Care Team:   Francis Dowse, [place] Casimiro Needle "[place]" [place], [place] Earnest Rosier, NP  Remote monitoring is used to monitor your Pacemaker/ ICD from home. This monitoring reduces the number of office visits required to check your device to one time per year. It allows [place] to keep an eye on the functioning of your device to ensure it is working properly.  Important Information About Sugar

## 2023-08-02 ENCOUNTER — Ambulatory Visit (INDEPENDENT_AMBULATORY_CARE_PROVIDER_SITE_OTHER): Payer: Medicare Other

## 2023-08-02 DIAGNOSIS — I255 Ischemic cardiomyopathy: Secondary | ICD-10-CM

## 2023-08-02 DIAGNOSIS — I5022 Chronic systolic (congestive) heart failure: Secondary | ICD-10-CM

## 2023-08-02 LAB — CUP PACEART REMOTE DEVICE CHECK
Battery Remaining Longevity: 4 mo
Battery Voltage: 2.76 V
Brady Statistic AP VP Percent: 0.09 %
Brady Statistic AP VS Percent: 0.02 %
Brady Statistic AS VP Percent: 98.34 %
Brady Statistic AS VS Percent: 1.55 %
Brady Statistic RA Percent Paced: 0.11 %
Brady Statistic RV Percent Paced: 0.49 %
Date Time Interrogation Session: 20241223012302
HighPow Impedance: 39 Ohm
Implantable Lead Connection Status: 753985
Implantable Lead Connection Status: 753985
Implantable Lead Connection Status: 753985
Implantable Lead Implant Date: 20170612
Implantable Lead Implant Date: 20170612
Implantable Lead Implant Date: 20170612
Implantable Lead Location: 753858
Implantable Lead Location: 753859
Implantable Lead Location: 753860
Implantable Lead Model: 4398
Implantable Lead Model: 5076
Implantable Pulse Generator Implant Date: 20170612
Lead Channel Impedance Value: 147.097
Lead Channel Impedance Value: 175.385
Lead Channel Impedance Value: 175.385
Lead Channel Impedance Value: 182.4 Ohm
Lead Channel Impedance Value: 182.4 Ohm
Lead Channel Impedance Value: 247 Ohm
Lead Channel Impedance Value: 285 Ohm
Lead Channel Impedance Value: 304 Ohm
Lead Channel Impedance Value: 342 Ohm
Lead Channel Impedance Value: 399 Ohm
Lead Channel Impedance Value: 456 Ohm
Lead Channel Impedance Value: 456 Ohm
Lead Channel Impedance Value: 513 Ohm
Lead Channel Impedance Value: 608 Ohm
Lead Channel Impedance Value: 646 Ohm
Lead Channel Impedance Value: 646 Ohm
Lead Channel Impedance Value: 646 Ohm
Lead Channel Impedance Value: 646 Ohm
Lead Channel Pacing Threshold Amplitude: 1 V
Lead Channel Pacing Threshold Amplitude: 1.125 V
Lead Channel Pacing Threshold Amplitude: 1.125 V
Lead Channel Pacing Threshold Pulse Width: 0.4 ms
Lead Channel Pacing Threshold Pulse Width: 0.4 ms
Lead Channel Pacing Threshold Pulse Width: 0.8 ms
Lead Channel Sensing Intrinsic Amplitude: 2 mV
Lead Channel Sensing Intrinsic Amplitude: 2 mV
Lead Channel Sensing Intrinsic Amplitude: 5.5 mV
Lead Channel Sensing Intrinsic Amplitude: 5.5 mV
Lead Channel Setting Pacing Amplitude: 1.75 V
Lead Channel Setting Pacing Amplitude: 2 V
Lead Channel Setting Pacing Amplitude: 2.5 V
Lead Channel Setting Pacing Pulse Width: 0.4 ms
Lead Channel Setting Pacing Pulse Width: 0.8 ms
Lead Channel Setting Sensing Sensitivity: 0.3 mV
Zone Setting Status: 755011
Zone Setting Status: 755011

## 2023-08-08 ENCOUNTER — Encounter (HOSPITAL_COMMUNITY): Payer: Self-pay

## 2023-08-08 ENCOUNTER — Emergency Department (HOSPITAL_COMMUNITY): Payer: Medicare Other

## 2023-08-08 ENCOUNTER — Other Ambulatory Visit: Payer: Self-pay

## 2023-08-08 ENCOUNTER — Inpatient Hospital Stay (HOSPITAL_COMMUNITY)
Admission: EM | Admit: 2023-08-08 | Discharge: 2023-08-21 | DRG: 291 | Disposition: A | Discharge status: Home Health | Payer: Medicare Other | Attending: Internal Medicine | Admitting: Internal Medicine

## 2023-08-08 DIAGNOSIS — K761 Chronic passive congestion of liver: Secondary | ICD-10-CM | POA: Diagnosis present

## 2023-08-08 DIAGNOSIS — I428 Other cardiomyopathies: Secondary | ICD-10-CM | POA: Diagnosis present

## 2023-08-08 DIAGNOSIS — I447 Left bundle-branch block, unspecified: Secondary | ICD-10-CM | POA: Diagnosis present

## 2023-08-08 DIAGNOSIS — Z883 Allergy status to other anti-infective agents status: Secondary | ICD-10-CM

## 2023-08-08 DIAGNOSIS — Z881 Allergy status to other antibiotic agents status: Secondary | ICD-10-CM

## 2023-08-08 DIAGNOSIS — E8809 Other disorders of plasma-protein metabolism, not elsewhere classified: Secondary | ICD-10-CM | POA: Diagnosis present

## 2023-08-08 DIAGNOSIS — E876 Hypokalemia: Secondary | ICD-10-CM | POA: Diagnosis present

## 2023-08-08 DIAGNOSIS — D631 Anemia in chronic kidney disease: Secondary | ICD-10-CM | POA: Diagnosis present

## 2023-08-08 DIAGNOSIS — N1832 Chronic kidney disease, stage 3b: Secondary | ICD-10-CM | POA: Diagnosis present

## 2023-08-08 DIAGNOSIS — I10 Essential (primary) hypertension: Secondary | ICD-10-CM | POA: Diagnosis not present

## 2023-08-08 DIAGNOSIS — Z6841 Body Mass Index (BMI) 40.0 and over, adult: Secondary | ICD-10-CM | POA: Diagnosis not present

## 2023-08-08 DIAGNOSIS — I5033 Acute on chronic diastolic (congestive) heart failure: Secondary | ICD-10-CM | POA: Diagnosis present

## 2023-08-08 DIAGNOSIS — I130 Hypertensive heart and chronic kidney disease with heart failure and stage 1 through stage 4 chronic kidney disease, or unspecified chronic kidney disease: Secondary | ICD-10-CM | POA: Diagnosis present

## 2023-08-08 DIAGNOSIS — I4729 Other ventricular tachycardia: Secondary | ICD-10-CM | POA: Diagnosis present

## 2023-08-08 DIAGNOSIS — E785 Hyperlipidemia, unspecified: Secondary | ICD-10-CM | POA: Diagnosis present

## 2023-08-08 DIAGNOSIS — Z955 Presence of coronary angioplasty implant and graft: Secondary | ICD-10-CM

## 2023-08-08 DIAGNOSIS — Z85820 Personal history of malignant melanoma of skin: Secondary | ICD-10-CM | POA: Diagnosis not present

## 2023-08-08 DIAGNOSIS — R531 Weakness: Secondary | ICD-10-CM

## 2023-08-08 DIAGNOSIS — J9601 Acute respiratory failure with hypoxia: Secondary | ICD-10-CM | POA: Diagnosis present

## 2023-08-08 DIAGNOSIS — R7989 Other specified abnormal findings of blood chemistry: Secondary | ICD-10-CM | POA: Diagnosis present

## 2023-08-08 DIAGNOSIS — J918 Pleural effusion in other conditions classified elsewhere: Secondary | ICD-10-CM | POA: Diagnosis present

## 2023-08-08 DIAGNOSIS — N179 Acute kidney failure, unspecified: Secondary | ICD-10-CM | POA: Diagnosis present

## 2023-08-08 DIAGNOSIS — Z885 Allergy status to narcotic agent status: Secondary | ICD-10-CM

## 2023-08-08 DIAGNOSIS — L89611 Pressure ulcer of right heel, stage 1: Secondary | ICD-10-CM | POA: Diagnosis present

## 2023-08-08 DIAGNOSIS — Y92009 Unspecified place in unspecified non-institutional (private) residence as the place of occurrence of the external cause: Secondary | ICD-10-CM

## 2023-08-08 DIAGNOSIS — Z8349 Family history of other endocrine, nutritional and metabolic diseases: Secondary | ICD-10-CM

## 2023-08-08 DIAGNOSIS — I959 Hypotension, unspecified: Secondary | ICD-10-CM | POA: Diagnosis present

## 2023-08-08 DIAGNOSIS — I2489 Other forms of acute ischemic heart disease: Secondary | ICD-10-CM | POA: Diagnosis present

## 2023-08-08 DIAGNOSIS — R350 Frequency of micturition: Secondary | ICD-10-CM | POA: Diagnosis present

## 2023-08-08 DIAGNOSIS — Z8249 Family history of ischemic heart disease and other diseases of the circulatory system: Secondary | ICD-10-CM

## 2023-08-08 DIAGNOSIS — R188 Other ascites: Secondary | ICD-10-CM | POA: Diagnosis present

## 2023-08-08 DIAGNOSIS — E66811 Obesity, class 1: Secondary | ICD-10-CM | POA: Diagnosis present

## 2023-08-08 DIAGNOSIS — Z95810 Presence of automatic (implantable) cardiac defibrillator: Secondary | ICD-10-CM | POA: Diagnosis not present

## 2023-08-08 DIAGNOSIS — Z888 Allergy status to other drugs, medicaments and biological substances status: Secondary | ICD-10-CM

## 2023-08-08 DIAGNOSIS — Z807 Family history of other malignant neoplasms of lymphoid, hematopoietic and related tissues: Secondary | ICD-10-CM

## 2023-08-08 DIAGNOSIS — Z90710 Acquired absence of both cervix and uterus: Secondary | ICD-10-CM

## 2023-08-08 DIAGNOSIS — Z79899 Other long term (current) drug therapy: Secondary | ICD-10-CM

## 2023-08-08 DIAGNOSIS — I2510 Atherosclerotic heart disease of native coronary artery without angina pectoris: Secondary | ICD-10-CM | POA: Diagnosis present

## 2023-08-08 DIAGNOSIS — D638 Anemia in other chronic diseases classified elsewhere: Secondary | ICD-10-CM | POA: Diagnosis present

## 2023-08-08 DIAGNOSIS — W19XXXA Unspecified fall, initial encounter: Secondary | ICD-10-CM

## 2023-08-08 DIAGNOSIS — E039 Hypothyroidism, unspecified: Secondary | ICD-10-CM | POA: Diagnosis present

## 2023-08-08 DIAGNOSIS — I3139 Other pericardial effusion (noninflammatory): Secondary | ICD-10-CM | POA: Diagnosis present

## 2023-08-08 DIAGNOSIS — C9210 Chronic myeloid leukemia, BCR/ABL-positive, not having achieved remission: Secondary | ICD-10-CM | POA: Diagnosis present

## 2023-08-08 DIAGNOSIS — R7401 Elevation of levels of liver transaminase levels: Secondary | ICD-10-CM | POA: Diagnosis present

## 2023-08-08 DIAGNOSIS — Z880 Allergy status to penicillin: Secondary | ICD-10-CM

## 2023-08-08 DIAGNOSIS — Z882 Allergy status to sulfonamides status: Secondary | ICD-10-CM

## 2023-08-08 DIAGNOSIS — I890 Lymphedema, not elsewhere classified: Secondary | ICD-10-CM | POA: Diagnosis present

## 2023-08-08 DIAGNOSIS — J90 Pleural effusion, not elsewhere classified: Principal | ICD-10-CM

## 2023-08-08 DIAGNOSIS — I5032 Chronic diastolic (congestive) heart failure: Secondary | ICD-10-CM | POA: Diagnosis not present

## 2023-08-08 DIAGNOSIS — Z8674 Personal history of sudden cardiac arrest: Secondary | ICD-10-CM

## 2023-08-08 DIAGNOSIS — Z91048 Other nonmedicinal substance allergy status: Secondary | ICD-10-CM

## 2023-08-08 LAB — GLUCOSE, PLEURAL OR PERITONEAL FLUID: Glucose, Fluid: 141 mg/dL

## 2023-08-08 LAB — I-STAT VENOUS BLOOD GAS, ED
Acid-base deficit: 3 mmol/L — ABNORMAL HIGH (ref 0.0–2.0)
Bicarbonate: 25.6 mmol/L (ref 20.0–28.0)
Calcium, Ion: 1.15 mmol/L (ref 1.15–1.40)
HCT: 33 % — ABNORMAL LOW (ref 36.0–46.0)
Hemoglobin: 11.2 g/dL — ABNORMAL LOW (ref 12.0–15.0)
O2 Saturation: 67 %
Potassium: 4.4 mmol/L (ref 3.5–5.1)
Sodium: 138 mmol/L (ref 135–145)
TCO2: 27 mmol/L (ref 22–32)
pCO2, Ven: 60.2 mm[Hg] — ABNORMAL HIGH (ref 44–60)
pH, Ven: 7.237 — ABNORMAL LOW (ref 7.25–7.43)
pO2, Ven: 42 mm[Hg] (ref 32–45)

## 2023-08-08 LAB — CBC WITH DIFFERENTIAL/PLATELET
Abs Immature Granulocytes: 0.04 10*3/uL (ref 0.00–0.07)
Basophils Absolute: 0 10*3/uL (ref 0.0–0.1)
Basophils Relative: 0 %
Eosinophils Absolute: 0 10*3/uL (ref 0.0–0.5)
Eosinophils Relative: 0 %
HCT: 32.1 % — ABNORMAL LOW (ref 36.0–46.0)
Hemoglobin: 9.8 g/dL — ABNORMAL LOW (ref 12.0–15.0)
Immature Granulocytes: 1 %
Lymphocytes Relative: 25 %
Lymphs Abs: 2.1 10*3/uL (ref 0.7–4.0)
MCH: 31.9 pg (ref 26.0–34.0)
MCHC: 30.5 g/dL (ref 30.0–36.0)
MCV: 104.6 fL — ABNORMAL HIGH (ref 80.0–100.0)
Monocytes Absolute: 0.6 10*3/uL (ref 0.1–1.0)
Monocytes Relative: 7 %
Neutro Abs: 5.8 10*3/uL (ref 1.7–7.7)
Neutrophils Relative %: 67 %
Platelets: 223 10*3/uL (ref 150–400)
RBC: 3.07 MIL/uL — ABNORMAL LOW (ref 3.87–5.11)
RDW: 16.3 % — ABNORMAL HIGH (ref 11.5–15.5)
WBC: 8.6 10*3/uL (ref 4.0–10.5)
nRBC: 0 % (ref 0.0–0.2)

## 2023-08-08 LAB — I-STAT CHEM 8, ED
BUN: 24 mg/dL — ABNORMAL HIGH (ref 8–23)
Calcium, Ion: 1.16 mmol/L (ref 1.15–1.40)
Chloride: 103 mmol/L (ref 98–111)
Creatinine, Ser: 2.1 mg/dL — ABNORMAL HIGH (ref 0.44–1.00)
Glucose, Bld: 145 mg/dL — ABNORMAL HIGH (ref 70–99)
HCT: 32 % — ABNORMAL LOW (ref 36.0–46.0)
Hemoglobin: 10.9 g/dL — ABNORMAL LOW (ref 12.0–15.0)
Potassium: 4.4 mmol/L (ref 3.5–5.1)
Sodium: 139 mmol/L (ref 135–145)
TCO2: 25 mmol/L (ref 22–32)

## 2023-08-08 LAB — LACTATE DEHYDROGENASE, PLEURAL OR PERITONEAL FLUID: LD, Fluid: 151 U/L — ABNORMAL HIGH (ref 3–23)

## 2023-08-08 LAB — COMPREHENSIVE METABOLIC PANEL
ALT: 194 U/L — ABNORMAL HIGH (ref 0–44)
AST: 310 U/L — ABNORMAL HIGH (ref 15–41)
Albumin: 3.3 g/dL — ABNORMAL LOW (ref 3.5–5.0)
Alkaline Phosphatase: 87 U/L (ref 38–126)
Anion gap: 11 (ref 5–15)
BUN: 22 mg/dL (ref 8–23)
CO2: 23 mmol/L (ref 22–32)
Calcium: 8.4 mg/dL — ABNORMAL LOW (ref 8.9–10.3)
Chloride: 101 mmol/L (ref 98–111)
Creatinine, Ser: 2.06 mg/dL — ABNORMAL HIGH (ref 0.44–1.00)
GFR, Estimated: 24 mL/min — ABNORMAL LOW (ref >60)
Glucose, Bld: 149 mg/dL — ABNORMAL HIGH (ref 70–99)
Potassium: 4.3 mmol/L (ref 3.5–5.1)
Sodium: 135 mmol/L (ref 135–145)
Total Bilirubin: 1.4 mg/dL — ABNORMAL HIGH (ref <1.2)
Total Protein: 6.8 g/dL (ref 6.5–8.1)

## 2023-08-08 LAB — TROPONIN I (HIGH SENSITIVITY)
Troponin I (High Sensitivity): 39 ng/L — ABNORMAL HIGH (ref <18)
Troponin I (High Sensitivity): 46 ng/L — ABNORMAL HIGH (ref <18)

## 2023-08-08 LAB — ALBUMIN, PLEURAL OR PERITONEAL FLUID: Albumin, Fluid: 2.1 g/dL

## 2023-08-08 LAB — I-STAT CG4 LACTIC ACID, ED
Lactic Acid, Venous: 1.8 mmol/L (ref 0.5–1.9)
Lactic Acid, Venous: 1.4 mmol/L (ref 0.5–1.9)

## 2023-08-08 LAB — BRAIN NATRIURETIC PEPTIDE: B Natriuretic Peptide: 804.7 pg/mL — ABNORMAL HIGH (ref 0.0–100.0)

## 2023-08-08 LAB — LACTATE DEHYDROGENASE: LDH: 551 U/L — ABNORMAL HIGH (ref 98–192)

## 2023-08-08 MED ORDER — ONDANSETRON HCL 4 MG/2ML IJ SOLN
4.0000 mg | Freq: Once | INTRAMUSCULAR | Status: AC
Start: 2023-08-08 — End: 2023-08-08
  Administered 2023-08-08: 4 mg via INTRAVENOUS
  Filled 2023-08-08: qty 2

## 2023-08-08 MED ORDER — LIDOCAINE HCL (PF) 1 % IJ SOLN
INTRAMUSCULAR | Status: AC
  Filled 2023-08-08: qty 10

## 2023-08-08 MED ORDER — METOPROLOL SUCCINATE ER 25 MG PO TB24
12.5000 mg | ORAL_TABLET | Freq: Every day | ORAL | Status: DC
  Administered 2023-08-10 – 2023-08-18 (×9): 12.5 mg via ORAL
  Filled 2023-08-08 (×10): qty 1

## 2023-08-08 MED ORDER — METOCLOPRAMIDE HCL 5 MG/ML IJ SOLN
10.0000 mg | Freq: Once | INTRAMUSCULAR | Status: AC
  Administered 2023-08-08: 10 mg via INTRAVENOUS
  Filled 2023-08-08: qty 2

## 2023-08-08 MED ORDER — ACETAMINOPHEN 650 MG RE SUPP: 650.0000 mg | Freq: Four times a day (QID) | RECTAL | Status: DC | PRN

## 2023-08-08 MED ORDER — LACTATED RINGERS IV BOLUS
500.0000 mL | Freq: Once | INTRAVENOUS | Status: AC
  Administered 2023-08-08: 500 mL via INTRAVENOUS

## 2023-08-08 MED ORDER — ASPIRIN 81 MG PO TBEC
81.0000 mg | DELAYED_RELEASE_TABLET | Freq: Every day | ORAL | Status: DC
  Administered 2023-08-09 – 2023-08-21 (×13): 81 mg via ORAL
  Filled 2023-08-08 (×13): qty 1

## 2023-08-08 MED ORDER — MELATONIN 3 MG PO TABS
3.0000 mg | ORAL_TABLET | Freq: Every evening | ORAL | Status: DC | PRN
  Filled 2023-08-08: qty 1

## 2023-08-08 MED ORDER — IMATINIB MESYLATE 400 MG PO TABS: 400.0000 mg | ORAL_TABLET | Freq: Every day | ORAL | Status: DC

## 2023-08-08 MED ORDER — PROCHLORPERAZINE EDISYLATE 10 MG/2ML IJ SOLN
10.0000 mg | Freq: Four times a day (QID) | INTRAMUSCULAR | Status: DC | PRN
  Filled 2023-08-08: qty 2

## 2023-08-08 MED ORDER — ONDANSETRON HCL 4 MG/2ML IJ SOLN
4.0000 mg | Freq: Four times a day (QID) | INTRAMUSCULAR | Status: DC | PRN
  Administered 2023-08-12 – 2023-08-19 (×5): 4 mg via INTRAVENOUS
  Filled 2023-08-08 (×4): qty 2

## 2023-08-08 MED ORDER — LIDOCAINE-EPINEPHRINE (PF) 2 %-1:200000 IJ SOLN
INTRAMUSCULAR | Status: AC
  Filled 2023-08-08: qty 20

## 2023-08-08 MED ORDER — ACETAMINOPHEN 325 MG PO TABS
650.0000 mg | ORAL_TABLET | Freq: Four times a day (QID) | ORAL | Status: DC | PRN
  Administered 2023-08-16 – 2023-08-20 (×6): 650 mg via ORAL
  Filled 2023-08-08 (×7): qty 2

## 2023-08-08 MED ORDER — FENTANYL CITRATE PF 50 MCG/ML IJ SOSY
50.0000 ug | PREFILLED_SYRINGE | Freq: Once | INTRAMUSCULAR | Status: AC
  Administered 2023-08-08: 50 ug via INTRAVENOUS
  Filled 2023-08-08: qty 1

## 2023-08-08 MED ORDER — AMIODARONE HCL 200 MG PO TABS
200.0000 mg | ORAL_TABLET | Freq: Every day | ORAL | Status: DC
  Administered 2023-08-09 – 2023-08-21 (×13): 200 mg via ORAL
  Filled 2023-08-08 (×13): qty 1

## 2023-08-08 MED ORDER — THYROID 30 MG PO TABS
15.0000 mg | ORAL_TABLET | Freq: Every day | ORAL | Status: DC
  Administered 2023-08-10 – 2023-08-21 (×12): 15 mg via ORAL
  Filled 2023-08-08 (×14): qty 1

## 2023-08-08 MED ORDER — FUROSEMIDE 10 MG/ML IJ SOLN
40.0000 mg | Freq: Two times a day (BID) | INTRAMUSCULAR | Status: DC
  Administered 2023-08-08 – 2023-08-10 (×4): 40 mg via INTRAVENOUS
  Filled 2023-08-08 (×4): qty 4

## 2023-08-08 NOTE — H&P (Incomplete)
History and Physical      Lisa [name]:[id number] DOB: 01-04-1941 DOA: 08/08/2023; DOS: 08/08/2023  PCP: Eartha Inch, MD  Patient coming from: home   I have personally briefly reviewed patient's old medical records in [facility]  Chief Complaint: sob  HPI: Lisa [name] is a 82 y.o. 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   We will the patient was reported to have been recently hospitalized at [facility] from 07/17/2023 to 07/21/2023 for suspected acute on chronic diastolic heart failure complicated by left-sided pleural effusion.  Heart failure improved with IV diuresis, the patient underwent left-sided thoracentesis on 07/20/2023.  She was subsequently discharged to home on 07/21/2023.  She presents back to the emergency department this evening complaining of 2 to 3 days of progressive shortness of breath associate with orthopnea,, cough, worsening of edema in the bilateral lower extremities.  Denies any associated chest pain, palpitations, diaphoresis, dizziness, presyncope, or syncope.  No recent wheezing or hemoptysis.  She reports good interval compliance on her home diuretic regimen which consists of torsemide 20 mg p.o. daily.  Over the last 3 to 4 days, she also notes generalized weakness in the absence of any acute focal weakness.  In the setting of his generalized weakness, she reports that she tripped while attempting to ambulate approximately 4 days ago, resulting in a ground-level fall in  which she hit her face, without associated loss of consciousness.  Denies any significant ensuing headache nor any ensuing acute neck pain.  Denies any recent dysuria or gross hematuria.  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     ED Course:  Vital signs in the ED were notable for the following: Afebrile; rates in the 60s to 70s; systolic blood pressures in the 150s to 180s; respiratory rate 17-20, initial oxygen saturation reported to be 48% on room air with good waveform associated with this pulse ox per my discussions with the EDP, which subsequently proved to 100% on 6 L nasal cannula.  Following ensuing diagnostic/therapeutic left-sided thoracentesis, supplemental oxygen has been weaned to 2 L nasal cannula with ensuing oxygen saturations remaining in the high 90s.  Labs were notable for the following: CMP notable for the following: Sodium 135, potassium 4.3, creatinine 2.06 compared to most recent prior value of 1.62 on 09/19/2022, calcium adjusted for mild hypoalbuminemia noted to be 8.9, avidin 3.3, alkaline phosphatase 87, total bilirubin 1.4 compared to 1.2 on 07/17/2023, AST 310 compared to 23 on 07/17/2023, and ALT 194 compared to 17 on 07/17/2023.  BNP 804.7 compared to 83.7 on 07/17/2023, and compared to 524 in November 2023.  High-sensitivity troponin initially 39, with repeat value trending up slightly to 46, compared to most recent prior value of 53 on 07/17/2023.  Lactic acid 1.8.  CBC notable for white cell count 8600, hemoglobin 9.8 associated macrocytic finding and relative to most recent prior hemoglobin data point of 7.5 on 07/18/2023 completely 2-3.  Blood cultures x 2 were collected.  Ensuing left-sided diagnostic/therapeutic paracentesis was performed by EDP associated with removal of 1.1  L of fluid.  Associated pleural fluid has been sent for analysis, including Gram stain culture, cell count with differential, LDH, glucose,  albumin, amylase, associated results currently pending.  Per my interpretation, EKG in ED demonstrated the following: Sinus rhythm with heart rate 71, left bundle branch block, no evidence of T wave or ST changes, including no evidence of ST elevation.  Imaging in the ED, per corresponding formal radiology read, was notable for the following: CT chest, abdomen, pelvis, demonstrated large left pleural effusion as well as moderate right pleural effusion, small volume simple ascites in the abdomen, while showing cholelithiasis without evidence of acute cholecystitis.  CT head showed no evidence of acute intracranial process, including no evidence of intracranial injury or any evidence of acute infarct.  CT cervical spine showed no evidence of acute cervical spine fracture or subluxation injury.  CT maxillofacial showed no evidence of acute fracture.  Post left thoracentesis 1 view chest x-ray was obtained, and showed small right pleural effusion as well as interval decrease in left-sided pleural effusion, with residual small left pleural effusion noted, and no evidence of pneumothorax.  This chest x-ray also showed bibasilar patchy opacities concerning for pulmonary edema in the absence of any evidence of infiltrate.  While in the ED, the following were administered: Fentanyl 50 mcg IV x 1, Zofran 4 mg IV x 1, Reglan 10 mg IV x 1.  Subsequently, the patient was admitted for further evaluation management of suspected acute on chronic diastolic heart failure complicated by acute hypoxic respiratory failure and left-sided pleural effusion, status post therapeutic/diagnostic left-sided thoracentesis performed this evening, with presentation also notable for generalized weakness, recent ground-level mechanical fall, and presenting labs notable for acute kidney injury superimposed on CKD 3B, acute transaminitis, and mildly elevated troponin.  ***red    Review of Systems: As per HPI otherwise 10 point review of  systems negative.   Past Medical History:  Diagnosis Date   CAD in native artery 2017   stent to LAD   Cardiac arrest (HCC) 09/2015   CHF (congestive heart failure) (HCC)    CML (chronic myelocytic leukemia) (HCC)    Complication of anesthesia    Hx: UTI (urinary tract infection)    Hypertension    Melanoma (HCC)    PONV (postoperative nausea and vomiting)    PVC (premature ventricular contraction)    HISTORY OF   Thyroid disease    V tach (HCC)     Past Surgical History:  Procedure Laterality Date   ABDOMINAL HYSTERECTOMY     APPENDECTOMY     CARDIAC CATHETERIZATION N/A 10/03/2015   Procedure: Left Heart Cath and Coronary Angiography;  Surgeon: Peter M Swaziland, MD;  Location: [facility] INVASIVE CV LAB;  Service: Cardiovascular;  Laterality: N/A;   CARDIAC CATHETERIZATION N/A 10/07/2015   Procedure: Coronary Stent Intervention;  Surgeon: Kathleene Hazel, MD;  Location: [facility] INVASIVE CV LAB;  Service: Cardiovascular;  Laterality: N/A;   CARDIAC CATHETERIZATION  09/22/2019   EP IMPLANTABLE DEVICE N/A 01/20/2016   Procedure: BiV ICD Insertion CRT-D;  Surgeon: Marinus Maw, MD;  Location: [facility] INVASIVE CV LAB;  Service: Cardiovascular;  Laterality: N/A;   IR THORACENTESIS ASP PLEURAL SPACE W/IMG GUIDE  07/20/2023   LEFT HEART CATH AND CORONARY ANGIOGRAPHY N/A 09/22/2019   Procedure: LEFT HEART CATH AND CORONARY ANGIOGRAPHY;  Surgeon: Swaziland, Peter M, MD;  Location: [facility] INVASIVE CV LAB;  Service: Cardiovascular;  Laterality: N/A;   TONSILLECTOMY     TONSILLECTOMY  TUBAL LIGATION      Social History:  reports that she has never smoked. She has never used smokeless tobacco. She reports that she does not drink alcohol and does not use drugs.   Allergies  Allergen Reactions   Flagyl [Metronidazole] Itching, Rash and Other (See Comments)    Welts, also   Penicillins Hives, Itching and Rash    Has patient had a PCN reaction causing immediate rash,  facial/tongue/throat swelling, SOB or lightheadedness with hypotension: Yes  Has patient had a PCN reaction causing severe rash involving mucus membranes or skin necrosis: Yes Has patient had a PCN reaction that required hospitalization No Has patient had a PCN reaction occurring within the last 10 years: NO If all of the above answers are "NO", then may proceed with Cephalosporin use.    Sulfamethoxazole Rash   Meperidine Nausea And Vomiting   Morphine And Codeine Nausea And Vomiting   Propoxyphene     Other reaction(s): Unknown   Tape Other (See Comments)    "THE PLASTIC, CLEAR TAPE CAN/DOES PULL OFF MY SKIN."   Doxycycline Nausea Only, Rash and Other (See Comments)    Made her "feel terrible"   Lanolin Itching and Rash   Sulfa Antibiotics Rash    Family History  Problem Relation Age of Onset   Coronary artery disease Mother    Thyroid disease Mother    Multiple myeloma Father    Heart attack Maternal Grandfather    Heart attack Paternal Grandfather     Family history reviewed and not pertinent    Prior to Admission medications   Medication Sig Start Date End Date Taking? Authorizing Provider  acetaminophen (TYLENOL) 500 MG tablet Take 500-1,000 mg by mouth every 6 (six) hours as needed for moderate pain.    [provider]  allopurinol (ZYLOPRIM) 100 MG tablet Take 100 mg by mouth daily.  06/15/17   [provider]  aluminum hydroxide-magnesium carbonate (GAVISCON) 95-358 MG/15ML SUSP Take 30 mLs by mouth as needed for indigestion or heartburn.    [provider]  amiodarone (PACERONE) 200 MG tablet Take 1 tablet (200 mg total) by mouth daily. 07/27/23   Marinus Maw, MD  aspirin EC 81 MG tablet Take 81 mg by mouth in the morning.    [provider]  atorvastatin (LIPITOR) 20 MG tablet Take 1 tablet (20 mg total) by mouth daily. 05/07/16   Bensimhon, Bevelyn Buckles, MD  b complex vitamins capsule Take 1 capsule by mouth daily.     [provider]  calcium carbonate (OS-CAL - DOSED IN MG OF ELEMENTAL CALCIUM) 1250 (500 Ca) MG tablet Take 1 tablet by mouth daily as needed (Calcium levels).    [provider]  Cholecalciferol (VITAMIN D-3 PO) Take 5,000 Units by mouth daily.    [provider]  Cyanocobalamin (VITAMIN B12 SL) Place 1 tablet under the tongue daily.    [provider]  imatinib (GLEEVEC) 400 MG tablet Take 400 mg by mouth daily. 02/16/17   [provider]  loperamide (IMODIUM) 2 MG capsule Take 2 mg by mouth as needed for diarrhea or loose stools.    [provider]  MAGNESIUM GLYCINATE PO Take 1 tablet by mouth daily.    [provider]  metoprolol succinate (TOPROL-XL) 25 MG 24 hr tablet Take one-half tablet by mouth daily 06/03/23   Graciella Freer, PA-C  Multiple Vitamin (MULTIVITAMIN WITH MINERALS) TABS tablet Take 1 tablet by mouth daily.  [provider]  nitroGLYCERIN (NITROSTAT) 0.4 MG SL tablet Place 1 tablet (0.4 mg total) under the tongue every 5 (five) minutes x 3 doses as needed for chest pain. 12/24/20   Pricilla Riffle, MD  potassium chloride (KLOR-CON) 10 MEQ tablet Take 1 tablet (10 mEq total) by mouth daily. Patient taking differently: Take 20 mEq by mouth daily. 06/26/22   Pricilla Riffle, MD  thyroid (ARMOUR) 15 MG tablet Take 15 mg by mouth daily before breakfast.     [provider]  timolol (TIMOPTIC) 0.5 % ophthalmic solution Place 1 drop into the left eye 2 (two) times daily. 07/15/23   [provider]  torsemide (DEMADEX) 20 MG tablet Take 1 tablet (20 mg total) by mouth as directed. Patient taking differently: Take 20 mg by mouth daily. 06/01/23   Marinus Maw, MD  vitamin C (ASCORBIC ACID) 500 MG tablet Take 500 mg by mouth daily.    [provider]     Objective    Physical Exam: Vitals:   08/08/23 1815 08/08/23 2000 08/08/23 2130 08/08/23 2144  BP: (!) 188/82 (!) 175/82  (!) 154/76   Pulse: 69  62   Resp: 19 18 20    Temp:    98.2 F (36.8 C)  TempSrc:    Oral  SpO2: 100% 100% 100%     General: appears to be stated age; alert, oriented Skin: warm, dry, no rash Head:  AT/[place] Mouth:  Oral mucosa membranes appear moist, normal dentition Neck: supple; trachea midline Heart:  RRR; did not appreciate any M/R/G Lungs: Slightly diminished bibasilar breath sounds, but otherwise CTAB, did not appreciate any wheezes, rales, or rhonchi Abdomen: + BS; soft, ND, NT Vascular: 2+ pedal pulses b/l; 2+ radial pulses b/l Extremities: Trace edema in the bilateral lower extremities, no muscle wasting Neuro: strength and sensation intact in upper and lower extremities b/l    Labs on Admission: I have personally reviewed following labs and imaging studies  CBC: Recent Labs  Lab 08/08/23 1645 08/08/23 1708 08/08/23 1709  WBC 8.6  --   --   NEUTROABS 5.8  --   --   HGB 9.8* 10.9* 11.2*  HCT 32.1* 32.0* 33.0*  MCV 104.6*  --   --   PLT 223  --   --    Basic Metabolic Panel: Recent Labs  Lab 08/08/23 1645 08/08/23 1708 08/08/23 1709  NA 135 139 138  K 4.3 4.4 4.4  CL 101 103  --   CO2 23  --   --   GLUCOSE 149* 145*  --   BUN 22 24*  --   CREATININE 2.06* 2.10*  --   CALCIUM 8.4*  --   --    GFR: Estimated Creatinine Clearance: 22.4 mL/min (A) (by C-G formula based on SCr of 2.1 mg/dL (H)). Liver Function Tests: Recent Labs  Lab 08/08/23 1645  AST 310*  ALT 194*  ALKPHOS 87  BILITOT 1.4*  PROT 6.8  ALBUMIN 3.3*   No results for input(s): "LIPASE", "AMYLASE" in the last 168 hours. No results for input(s): "AMMONIA" in the last 168 hours. Coagulation Profile: No results for input(s): "INR", "PROTIME" in the last 168 hours. Cardiac Enzymes: No results for input(s): "CKTOTAL", "CKMB", "CKMBINDEX", "TROPONINI" in the last 168 hours. BNP (last 3 results) No results for input(s): "PROBNP" in the last 8760 hours. HbA1C: No results for input(s):  "HGBA1C" in the last 72 hours. CBG: No results for input(s): "GLUCAP" in the  last 168 hours. Lipid Profile: No results for input(s): "CHOL", "HDL", "LDLCALC", "TRIG", "CHOLHDL", "LDLDIRECT" in the last 72 hours. Thyroid Function Tests: No results for input(s): "TSH", "T4TOTAL", "FREET4", "T3FREE", "THYROIDAB" in the last 72 hours. Anemia Panel: No results for input(s): "VITAMINB12", "FOLATE", "FERRITIN", "TIBC", "IRON", "RETICCTPCT" in the last 72 hours. Urine analysis:    Component Value Date/Time   COLORURINE YELLOW 06/17/2022 0608   APPEARANCEUR HAZY (A) 06/17/2022 0608   LABSPEC 1.018 06/17/2022 0608   PHURINE 5.0 06/17/2022 0608   GLUCOSEU NEGATIVE 06/17/2022 0608   HGBUR NEGATIVE 06/17/2022 0608   BILIRUBINUR NEGATIVE 06/17/2022 0608   BILIRUBINUR negative 06/04/2016 0847   BILIRUBINUR neg 02/14/2015 1431   KETONESUR NEGATIVE 06/17/2022 0608   PROTEINUR NEGATIVE 06/17/2022 0608   UROBILINOGEN 0.2 06/04/2016 0847   NITRITE NEGATIVE 06/17/2022 0608   LEUKOCYTESUR NEGATIVE 06/17/2022 0347    Radiological Exams on Admission: DG Chest Portable 1 View Result Date: 08/08/2023 CLINICAL DATA:  Shortness of breath.  Status post thoracentesis. EXAM: PORTABLE CHEST 1 VIEW COMPARISON:  Chest radiograph dated 08/08/2023 at 5:34 p.m. FINDINGS: Lines/tubes: Left chest wall ICD leads project over the right atrium and ventricle and tributary of the coronary sinus. Lungs: Low lung volumes. Bilateral interstitial opacities. Bibasilar patchy opacities. Pleura: Small right and decreased small left pleural effusions. No pneumothorax. Heart/mediastinum: Similar enlarged cardiomediastinal silhouette. Bones: No acute osseous abnormality. IMPRESSION: 1. Small right and decreased small left pleural effusions. No pneumothorax. 2. Bilateral interstitial opacities and bibasilar patchy opacities, likely pulmonary edema. Electronically Signed   By: Agustin Cree M.D.   On: 08/08/2023 21:12   CT CHEST ABDOMEN  PELVIS WO CONTRAST Result Date: 08/08/2023 CLINICAL DATA:  Sepsis.  Recent fall. EXAM: CT CHEST, ABDOMEN AND PELVIS WITHOUT CONTRAST TECHNIQUE: Multidetector CT imaging of the chest, abdomen and pelvis was performed following the standard protocol without IV contrast. RADIATION DOSE REDUCTION: This exam was performed according to the departmental dose-optimization program which includes automated exposure control, adjustment of the mA and/or kV according to patient size and/or use of iterative reconstruction technique. COMPARISON:  Radiograph earlier today. 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 Aortic atherosclerosis without aneurysm. The heart is mildly enlarged. No pericardial effusion. Mediastinum/Nodes: Lack of contrast limits hilar assessment. No gross mediastinal adenopathy. No mediastinal hemorrhage. Decompressed esophagus, no pneumomediastinum. Subcentimeter thyroid nodules. Not clinically significant; no follow-up imaging recommended (ref: J Am Coll Radiol. 2015 Feb;12(2): 143-50). Lungs/Pleura: Large left and moderate right pleural effusion, simple fluid density. Complete collapse/compressive atelectasis of the left lower lobe with subtotal atelectasis of the anterior left upper lobe. Only small portion of left upper lobe is aerated in the hemithorax. No evidence of endobronchial or tracheal debris or obstructing lesion. Compressive atelectasis in the dependent right lung. Perifissural atelectasis in the right upper and middle lobes. No evidence of pulmonary mass. Musculoskeletal: There are no acute or suspicious osseous abnormalities. Age related degenerative change in the spine. Bilateral breast implants which are partially calcified. CT ABDOMEN PELVIS FINDINGS Hepatobiliary: Subtle capsular nodularity of the liver suspicious for cirrhosis. 8 mm hypodensity in the subcapsular right lobe of the liver is unchanged from prior exam and  likely cyst. Layering sludge or stones in the gallbladder. No pericholecystic inflammation. No biliary dilatation. No evidence of hepatic injury. Pancreas: Unremarkable unenhanced appearance. Spleen: Unremarkable unenhanced appearance. 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 No further follow-up imaging is recommended. Normal right adrenal gland. No evidence of adrenal hemorrhage or  injury. Bilateral renal parenchymal thinning without hydronephrosis or focal renal abnormality on this unenhanced exam. Stomach/Bowel: Bowel assessment is limited in the absence of contrast, arms down positioning and motion. The stomach is decompressed. No evidence of bowel obstruction or inflammation. Small volume of stool in the colon. Vascular/Lymphatic: Advanced aortic and branch atherosclerosis. No aortic aneurysm. No retroperitoneal fluid or perivascular haziness to suggest injury. Scattered small retroperitoneal and periportal nodes are typically reactive. No suspicious adenopathy. Reproductive: Status post hysterectomy. No adnexal masses. Other: Small volume simple ascites in the abdomen and pelvis. No evidence of hemorrhage. No free air. Mild generalized body wall edema. Musculoskeletal: There are no acute or suspicious osseous abnormalities. Moderate degenerative change in the lumbar spine. IMPRESSION: 1. Large left pleural effusion with subtotal collapse/atelectasis of the left lung. Only small portion of the left upper lobe is aerated. Moderate-sized right pleural effusion with atelectasis throughout the right lung. 2. Small volume simple ascites in the abdomen and pelvis. Mild generalized body wall edema. 3. Subtle capsular nodularity of the liver suspicious for cirrhosis. 4. Cholelithiasis without evidence of cholecystitis. Aortic Atherosclerosis (ICD10-I70.0). Electronically Signed   By: Narda Rutherford M.D.   On: 08/08/2023 19:24   CT HEAD WO CONTRAST ( ) Result Date:  08/08/2023 CLINICAL DATA:  Trauma EXAM: CT HEAD WITHOUT CONTRAST CT MAXILLOFACIAL WITHOUT CONTRAST CT CERVICAL SPINE WITHOUT CONTRAST TECHNIQUE: Multidetector CT imaging of the head, cervical spine, and maxillofacial structures were performed using the standard protocol without intravenous contrast. Multiplanar CT image reconstructions of the cervical spine and maxillofacial structures were also generated. RADIATION DOSE REDUCTION: This exam was performed according to the departmental dose-optimization program which includes automated exposure control, adjustment of the mA and/or kV according to patient size and/or use of iterative reconstruction technique. COMPARISON:  05/01/2020 FINDINGS: CT HEAD FINDINGS Brain: There is no mass, hemorrhage or extra-axial collection. The size and configuration of the ventricles and extra-axial CSF spaces are normal. There is hypoattenuation of the white matter, most commonly indicating chronic small vessel disease. Vascular: Atherosclerotic calcification of the internal carotid arteries at the skull base. No abnormal hyperdensity of the major intracranial arteries or dural venous sinuses. Skull: The visualized skull base, calvarium and extracranial soft tissues are normal. Sinuses/Orbits: No fluid levels or advanced mucosal thickening of the visualized paranasal sinuses. No mastoid or middle ear effusion. Normal orbits. Other: None. CT MAXILLOFACIAL FINDINGS Osseous: No facial fracture or mandibular dislocation. Orbits: The globes are intact. Normal appearance of the intra- and extraconal fat. Symmetric extraocular muscles and optic nerves. Sinuses: No fluid levels or advanced mucosal thickening. Soft tissues: Normal visualized extracranial soft tissues. CT CERVICAL SPINE FINDINGS Alignment: No static subluxation. Facets are aligned. Occipital condyles and the lateral masses of C1-C2 are aligned. Skull base and vertebrae: No acute fracture. Soft tissues and spinal canal: No  prevertebral fluid or swelling. No visible canal hematoma. Disc levels: No advanced spinal canal or neural foraminal stenosis. Upper chest: Fluid filling the left hemithorax. Small right pleural effusion, incompletely visualized. Other: 1.8 cm left parotid mass. IMPRESSION: 1. No acute intracranial abnormality. 2. No facial fracture. 3. No acute fracture or static subluxation of the cervical spine. 4. Fluid filling the left hemithorax. Small right pleural effusion, incompletely visualized. 5. 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 Due to the overlap of the imaging features of benign and malignant parotid masses, histologic sampling is recommended. Electronically Signed   By: Deatra Robinson M.D.   On: 08/08/2023 19:19   CT Maxillofacial Wo Contrast Result Date:  08/08/2023 CLINICAL DATA:  Trauma EXAM: CT HEAD WITHOUT CONTRAST CT MAXILLOFACIAL WITHOUT CONTRAST CT CERVICAL SPINE WITHOUT CONTRAST TECHNIQUE: Multidetector CT imaging of the head, cervical spine, and maxillofacial structures were performed using the standard protocol without intravenous contrast. Multiplanar CT image reconstructions of the cervical spine and maxillofacial structures were also generated. RADIATION DOSE REDUCTION: This exam was performed according to the departmental dose-optimization program which includes automated exposure control, adjustment of the mA and/or kV according to patient size and/or use of iterative reconstruction technique. COMPARISON:  05/01/2020 FINDINGS: CT HEAD FINDINGS Brain: There is no mass, hemorrhage or extra-axial collection. The size and configuration of the ventricles and extra-axial CSF spaces are normal. There is hypoattenuation of the white matter, most commonly indicating chronic small vessel disease. Vascular: Atherosclerotic calcification of the internal carotid arteries at the skull base. No abnormal hyperdensity of the major intracranial arteries or dural venous sinuses. Skull: The visualized skull base, calvarium  and extracranial soft tissues are normal. Sinuses/Orbits: No fluid levels or advanced mucosal thickening of the visualized paranasal sinuses. No mastoid or middle ear effusion. Normal orbits. Other: None. CT MAXILLOFACIAL FINDINGS Osseous: No facial fracture or mandibular dislocation. Orbits: The globes are intact. Normal appearance of the intra- and extraconal fat. Symmetric extraocular muscles and optic nerves. Sinuses: No fluid levels or advanced mucosal thickening. Soft tissues: Normal visualized extracranial soft tissues. CT CERVICAL SPINE FINDINGS Alignment: No static subluxation. Facets are aligned. Occipital condyles and the lateral masses of C1-C2 are aligned. Skull base and vertebrae: No acute fracture. Soft tissues and spinal canal: No prevertebral fluid or swelling. No visible canal hematoma. Disc levels: No advanced spinal canal or neural foraminal stenosis. Upper chest: Fluid filling the left hemithorax. Small right pleural effusion, incompletely visualized. Other: 1.8 cm left parotid mass. IMPRESSION: 1. No acute intracranial abnormality. 2. No facial fracture. 3. No acute fracture or static subluxation of the cervical spine. 4. Fluid filling the left hemithorax. Small right pleural effusion, incompletely visualized. 5. 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 Due to the overlap of the imaging features of benign and malignant parotid masses, histologic sampling is recommended. Electronically Signed   By: Deatra Robinson M.D.   On: 08/08/2023 19:19   CT Cervical Spine Wo Contrast Result Date: 08/08/2023 CLINICAL DATA:  Trauma EXAM: CT HEAD WITHOUT CONTRAST CT MAXILLOFACIAL WITHOUT CONTRAST CT CERVICAL SPINE WITHOUT CONTRAST TECHNIQUE: Multidetector CT imaging of the head, cervical spine, and maxillofacial structures were performed using the standard protocol without intravenous contrast. Multiplanar CT image reconstructions of the cervical spine and maxillofacial structures were also generated. RADIATION DOSE  REDUCTION: This exam was performed according to the departmental dose-optimization program which includes automated exposure control, adjustment of the mA and/or kV according to patient size and/or use of iterative reconstruction technique. COMPARISON:  05/01/2020 FINDINGS: CT HEAD FINDINGS Brain: There is no mass, hemorrhage or extra-axial collection. The size and configuration of the ventricles and extra-axial CSF spaces are normal. There is hypoattenuation of the white matter, most commonly indicating chronic small vessel disease. Vascular: Atherosclerotic calcification of the internal carotid arteries at the skull base. No abnormal hyperdensity of the major intracranial arteries or dural venous sinuses. Skull: The visualized skull base, calvarium and extracranial soft tissues are normal. Sinuses/Orbits: No fluid levels or advanced mucosal thickening of the visualized paranasal sinuses. No mastoid or middle ear effusion. Normal orbits. Other: None. CT MAXILLOFACIAL FINDINGS Osseous: No facial fracture or mandibular dislocation. Orbits: The globes are intact. Normal appearance of the intra- and extraconal  fat. Symmetric extraocular muscles and optic nerves. Sinuses: No fluid levels or advanced mucosal thickening. Soft tissues: Normal visualized extracranial soft tissues. CT CERVICAL SPINE FINDINGS Alignment: No static subluxation. Facets are aligned. Occipital condyles and the lateral masses of C1-C2 are aligned. Skull base and vertebrae: No acute fracture. Soft tissues and spinal canal: No prevertebral fluid or swelling. No visible canal hematoma. Disc levels: No advanced spinal canal or neural foraminal stenosis. Upper chest: Fluid filling the left hemithorax. Small right pleural effusion, incompletely visualized. Other: 1.8 cm left parotid mass. IMPRESSION: 1. No acute intracranial abnormality. 2. No facial fracture. 3. No acute fracture or static subluxation of the cervical spine. 4. Fluid filling the left  hemithorax. Small right pleural effusion, incompletely visualized. 5. 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 Due to the overlap of the imaging features of benign and malignant parotid masses, histologic sampling is recommended. Electronically Signed   By: Deatra Robinson M.D.   On: 08/08/2023 19:19   DG Hand 2 View Left Result Date: 08/08/2023 CLINICAL DATA:  Recent fall with left hand pain, initial encounter EXAM: LEFT HAND - 2 VIEW COMPARISON:  None Available. FINDINGS: There is no evidence of fracture or dislocation. There is no evidence of arthropathy or other focal bone abnormality. Soft tissues are unremarkable. IMPRESSION: No acute abnormality noted. Electronically Signed   By: Alcide Clever M.D.   On: 08/08/2023 18:12   DG Hand 2 View Right Result Date: 08/08/2023 CLINICAL DATA:  Recent fall with right hand pain, initial encounter EXAM: RIGHT HAND - 2 VIEW COMPARISON:  None Available. FINDINGS: There is no evidence of fracture or dislocation. There is no evidence of arthropathy or other focal bone abnormality. Soft tissues are unremarkable. IMPRESSION: No acute abnormality noted. Electronically Signed   By: Alcide Clever M.D.   On: 08/08/2023 18:12   DG Chest Port 1 View Result Date: 08/08/2023 CLINICAL DATA:  Recent fall with chest pain, initial encounter EXAM: PORTABLE CHEST 1 VIEW COMPARISON:  07/20/2023 FINDINGS: Cardiac shadow is enlarged. Defibrillator is again noted and stable. Aortic calcifications are seen. Patient rotation to the right is noted accentuating the mediastinal markings. The lungs are well aerated. Small left pleural effusion remains. No new focal abnormality is noted. IMPRESSION: No significant change from the prior exam. Electronically Signed   By: Alcide Clever M.D.   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)   ***           #) Acute on chronic diastolic heart failure: dx of acute decompensation  on the basis of presenting 2 days of progressive shortness of breath associate with orthopnea, worsening of peripheral edema, elevation in BNP on par with degree of elevation in BNP at time of most recent prior hospitalization for acute on chronic diastolic heart failure, with imaging showing evidence of bilateral pleural effusions as well as evidence of pulmonary edema. This is in the context of a known history of chronic diastolic heart failure, with most recent echocardiogram performed on 07/18/2023, which was notable for LVEF 60 to 65%, no focal Measurin values, and normal right ventricular systolic function, moderate pericardial effusion and trivial mitral regurgitation. Etiology leading to presenting acutely decompensated heart failure is not entirely clear at this time, with patient conveying good compliance with her outpatient torsemide 20 mg p.o. daily.   I suspect that mildly elevated initial troponin is a consequence of underlying acutely decompensated heart  failure as opposed to representing ACS causing presenting acute heart failure exacerbation, particularly in the absence of any recent CP, and with presenting EKG showing no evidence of acute ischemic changes.  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 however, will continue to evaluate with further trending of troponin and close monitoring on tele.   Will pursue IV diuresis, as outlined below. As patient is already on a BB at home, will plan to continue this.  Of note, patient did recently undergo echocardiogram, will pursue updated echocardiogram in the morning, in part to evaluate degree of her previously noted moderate pericardial effusion,  which, in setting of further progression, could impart ensuing preload dependent pathophysiology, which could modify plan regarding IV diuresis.   Plan: monitor strict I's & O's and daily weights. Monitor on telemetry. CMP in the morning, including for monitoring trend of potassium, bicarbonate, and renal function in response to interval diuresis efforts. Add-on serum magnesium level, and repeat this level in the AM. Close monitoring of ensuing blood pressure response to diuresis efforts, including to help guide need for improvement in afterload reduction in order to optimize cardiac output. Trend troponin. Lasix 40 mg IV twice daily, which is a conservative initial dose given her degree of outpatient torsemide, which may be altered pending results of ensuing echocardiogram ordered to evaluate degree of previously noted pericardial effusion, as above.  Continue outpatient metoprolol succinate   *** echo (when pt more euvolumeic) ***Continue outpatient BB, ACE-I ***.   *** potassium supplementation *** EKG *** troponin *** Will reassess volume status in the morning and clinically correlate to help guide additional diuresis efforts. In the meantime, I have ordered ***   ***                    ***                 ***               ***               ***               ***                ***               ***               ***               ***               ***              ***          ***  DVT prophylaxis: SCD's ***  Code Status: Full code*** Family Communication: none*** Disposition Plan: Per Rounding Team Consults called: none***;  Admission status: ***     I SPENT GREATER THAN 75 *** MINUTES IN CLINICAL CARE TIME/MEDICAL DECISION-MAKING IN COMPLETING THIS ADMISSION.      Chaney Born Adisa Vigeant DO [facility]  From 7PM - 7AM   08/08/2023, 10:00 PM   ***

## 2023-08-08 NOTE — H&P (Incomplete)
History and Physical      Lisa [name] ZOX:[id number] DOB: Sep 20, 1940 DOA: 08/08/2023; DOS: 08/08/2023  PCP: Eartha Inch, MD  Patient coming from: home   I have personally briefly reviewed patient's old medical records in [facility]  Chief Complaint: sob  HPI: Lisa [name] is a 82 y.o. 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   We will the patient was reported to have been recently hospitalized at [facility] from 07/17/2023 to 07/21/2023 for suspected acute on chronic diastolic heart failure complicated by left-sided pleural effusion.  Heart failure improved with IV diuresis, the patient underwent left-sided thoracentesis on 07/20/2023.  She was subsequently discharged to home on 07/21/2023.  She presents back to the emergency department this evening complaining of 2 to 3 days of progressive shortness of breath associate with orthopnea,, cough, worsening of edema in the bilateral lower extremities.  Denies any associated chest pain, palpitations, diaphoresis, dizziness, presyncope, or syncope.  No recent wheezing or hemoptysis.  She reports good interval compliance on her home diuretic regimen which consists of torsemide 20 mg p.o. daily.  Over the last 3 to 4 days, she also notes generalized weakness in the absence of any acute focal weakness.  In the setting of his generalized weakness, she reports that she tripped while attempting to ambulate approximately 4 days ago, resulting in a ground-level fall in  which she hit her face, without associated loss of consciousness.  Denies any significant ensuing headache nor any ensuing acute neck pain.  Denies any recent dysuria or gross hematuria.  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  She confirms no known baseline supplemental oxygen requirements.    ED Course:  Vital signs in the ED were notable for the following: Afebrile; rates in the 60s to 70s; systolic blood pressures in the 150s to 180s; respiratory rate 17-20, initial oxygen saturation reported to be 48% on room air with good waveform associated with this pulse ox per my discussions with the EDP, which subsequently proved to 100% on 6 L nasal cannula.  Following ensuing diagnostic/therapeutic left-sided thoracentesis, supplemental oxygen has been weaned to 2 L nasal cannula with ensuing oxygen saturations remaining in the high 90s.  Labs were notable for the following: CMP notable for the following: Sodium 135, potassium 4.3, creatinine 2.06 compared to most recent prior value of 1.62 on 09/19/2022, calcium adjusted for mild hypoalbuminemia noted to be 8.9, avidin 3.3, alkaline phosphatase 87, total bilirubin 1.4 compared to 1.2 on 07/17/2023, AST 310 compared to 23 on 07/17/2023, and ALT 194 compared to 17 on 07/17/2023.  BNP 804.7 compared to 83.7 on 07/17/2023, and compared to 524 in November 2023.  High-sensitivity troponin initially 39, with repeat value trending up slightly to 46, compared to most recent prior value of 53 on 07/17/2023.  Lactic acid 1.8.  CBC notable for white cell count 8600, hemoglobin 9.8 associated macrocytic finding and relative to most recent prior hemoglobin data point of 7.5 on 07/18/2023 completely 2-3.  Blood cultures x 2 were collected.  Ensuing left-sided diagnostic/therapeutic paracentesis was  performed by EDP associated with removal of 1.1 L of fluid.  Associated pleural fluid has been sent for analysis, including Gram  stain culture, cell count with differential, LDH, glucose, albumin, amylase, associated results currently pending.  Per my interpretation, EKG in ED demonstrated the following: Sinus rhythm with heart rate 71, left bundle branch block, no evidence of T wave or ST changes, including no evidence of ST elevation.  Imaging in the ED, per corresponding formal radiology read, was notable for the following: CT chest, abdomen, pelvis, demonstrated large left pleural effusion as well as moderate right pleural effusion, small volume simple ascites in the abdomen, while showing cholelithiasis without evidence of acute cholecystitis.  CT head showed no evidence of acute intracranial process, including no evidence of intracranial injury or any evidence of acute infarct.  CT cervical spine showed no evidence of acute cervical spine fracture or subluxation injury.  CT maxillofacial showed no evidence of acute fracture.  Post left thoracentesis 1 view chest x-ray was obtained, and showed small right pleural effusion as well as interval decrease in left-sided pleural effusion, with residual small left pleural effusion noted, and no evidence of pneumothorax.  This chest x-ray also showed bibasilar patchy opacities concerning for pulmonary edema in the absence of any evidence of infiltrate.  While in the ED, the following were administered: Fentanyl 50 mcg IV x 1, Zofran 4 mg IV x 1, Reglan 10 mg IV x 1.  Subsequently, the patient was admitted for further evaluation management of suspected acute on chronic diastolic heart failure complicated by acute hypoxic respiratory failure and left-sided pleural effusion, status post therapeutic/diagnostic left-sided thoracentesis performed this evening, with presentation also notable for generalized weakness, recent ground-level mechanical fall, and presenting labs notable for acute kidney injury superimposed on CKD 3B, acute transaminitis, and mildly elevated troponin.     Review  of Systems: As per HPI otherwise 10 point review of systems negative.   Past Medical History:  Diagnosis Date   CAD in native artery 2017   stent to LAD   Cardiac arrest (HCC) 09/2015   CHF (congestive heart failure) (HCC)    CML (chronic myelocytic leukemia) (HCC)    Complication of anesthesia    Hx: UTI (urinary tract infection)    Hypertension    Melanoma (HCC)    PONV (postoperative nausea and vomiting)    PVC (premature ventricular contraction)    HISTORY OF   Thyroid disease    V tach (HCC)     Past Surgical History:  Procedure Laterality Date   ABDOMINAL HYSTERECTOMY     APPENDECTOMY     CARDIAC CATHETERIZATION N/A 10/03/2015   Procedure: Left Heart Cath and Coronary Angiography;  Surgeon: Peter M Swaziland, MD;  Location: [facility] INVASIVE CV LAB;  Service: Cardiovascular;  Laterality: N/A;   CARDIAC CATHETERIZATION N/A 10/07/2015   Procedure: Coronary Stent Intervention;  Surgeon: Kathleene Hazel, MD;  Location: [facility] INVASIVE CV LAB;  Service: Cardiovascular;  Laterality: N/A;   CARDIAC CATHETERIZATION  09/22/2019   EP IMPLANTABLE DEVICE N/A 01/20/2016   Procedure: BiV ICD Insertion CRT-D;  Surgeon: Marinus Maw, MD;  Location: [facility] INVASIVE CV LAB;  Service: Cardiovascular;  Laterality: N/A;   IR THORACENTESIS ASP PLEURAL SPACE W/IMG GUIDE  07/20/2023   LEFT HEART CATH AND CORONARY ANGIOGRAPHY N/A 09/22/2019   Procedure: LEFT HEART CATH AND CORONARY ANGIOGRAPHY;  Surgeon: Swaziland, Peter M, MD;  Location: [facility] INVASIVE CV LAB;  Service: Cardiovascular;  Laterality: N/A;   TONSILLECTOMY  TONSILLECTOMY     TUBAL LIGATION      Social History:  reports that she has never smoked. She has never used smokeless tobacco. She reports that she does not drink alcohol and does not use drugs.   Allergies  Allergen Reactions   Flagyl [Metronidazole] Itching, Rash and Other (See Comments)    Welts, also   Penicillins Hives, Itching and Rash    Has patient had a PCN reaction causing  immediate rash, facial/tongue/throat swelling, SOB or lightheadedness with hypotension: Yes  Has patient had a PCN reaction causing severe rash involving mucus membranes or skin necrosis: Yes Has patient had a PCN reaction that required hospitalization No Has patient had a PCN reaction occurring within the last 10 years: NO If all of the above answers are "NO", then may proceed with Cephalosporin use.    Sulfamethoxazole Rash   Meperidine Nausea And Vomiting   Morphine And Codeine Nausea And Vomiting   Propoxyphene     Other reaction(s): Unknown   Tape Other (See Comments)    "THE PLASTIC, CLEAR TAPE CAN/DOES PULL OFF MY SKIN."   Doxycycline Nausea Only, Rash and Other (See Comments)    Made her "feel terrible"   Lanolin Itching and Rash   Sulfa Antibiotics Rash    Family History  Problem Relation Age of Onset   Coronary artery disease Mother    Thyroid disease Mother    Multiple myeloma Father    Heart attack Maternal Grandfather    Heart attack Paternal Grandfather     Family history reviewed and not pertinent    Prior to Admission medications   Medication Sig Start Date End Date Taking? Authorizing Provider  acetaminophen (TYLENOL) 500 MG tablet Take 500-1,000 mg by mouth every 6 (six) hours as needed for moderate pain.    [provider]  allopurinol (ZYLOPRIM) 100 MG tablet Take 100 mg by mouth daily.  06/15/17   [provider]  aluminum hydroxide-magnesium carbonate (GAVISCON) 95-358 MG/15ML SUSP Take 30 mLs by mouth as needed for indigestion or heartburn.    [provider]  amiodarone (PACERONE) 200 MG tablet Take 1 tablet (200 mg total) by mouth daily. 07/27/23   Marinus Maw, MD  aspirin EC 81 MG tablet Take 81 mg by mouth in the morning.    [provider]  atorvastatin (LIPITOR) 20 MG tablet Take 1 tablet (20 mg total) by mouth daily. 05/07/16   Bensimhon, Bevelyn Buckles, MD  b complex vitamins capsule Take 1 capsule by mouth daily.     [provider]  calcium carbonate (OS-CAL - DOSED IN MG OF ELEMENTAL CALCIUM) 1250 (500 Ca) MG tablet Take 1 tablet by mouth daily as needed (Calcium levels).    [provider]  Cholecalciferol (VITAMIN D-3 PO) Take 5,000 Units by mouth daily.    [provider]  Cyanocobalamin (VITAMIN B12 SL) Place 1 tablet under the tongue daily.    [provider]  imatinib (GLEEVEC) 400 MG tablet Take 400 mg by mouth daily. 02/16/17   [provider]  loperamide (IMODIUM) 2 MG capsule Take 2 mg by mouth as needed for diarrhea or loose stools.    [provider]  MAGNESIUM GLYCINATE PO Take 1 tablet by mouth daily.    [provider]  metoprolol succinate (TOPROL-XL) 25 MG 24 hr tablet Take one-half tablet by mouth daily 06/03/23   Graciella Freer, PA-C  Multiple Vitamin (MULTIVITAMIN WITH MINERALS) TABS tablet Take 1 tablet by  mouth daily.    [provider]  nitroGLYCERIN (NITROSTAT) 0.4 MG SL tablet Place 1 tablet (0.4 mg total) under the tongue every 5 (five) minutes x 3 doses as needed for chest pain. 12/24/20   Pricilla Riffle, MD  potassium chloride (KLOR-CON) 10 MEQ tablet Take 1 tablet (10 mEq total) by mouth daily. Patient taking differently: Take 20 mEq by mouth daily. 06/26/22   Pricilla Riffle, MD  thyroid (ARMOUR) 15 MG tablet Take 15 mg by mouth daily before breakfast.     [provider]  timolol (TIMOPTIC) 0.5 % ophthalmic solution Place 1 drop into the left eye 2 (two) times daily. 07/15/23   [provider]  torsemide (DEMADEX) 20 MG tablet Take 1 tablet (20 mg total) by mouth as directed. Patient taking differently: Take 20 mg by mouth daily. 06/01/23   Marinus Maw, MD  vitamin C (ASCORBIC ACID) 500 MG tablet Take 500 mg by mouth daily.    [provider]     Objective    Physical Exam: Vitals:   08/08/23 1815 08/08/23 2000 08/08/23 2130 08/08/23 2144  BP: (!) 188/82 (!) 175/82  (!) 154/76   Pulse: 69  62   Resp: 19 18 20    Temp:    98.2 F (36.8 C)  TempSrc:    Oral  SpO2: 100% 100% 100%     General: appears to be stated age; alert, oriented Skin: warm, dry, no rash Head:  AT/[place] Mouth:  Oral mucosa membranes appear moist, normal dentition Neck: supple; trachea midline Heart:  RRR; did not appreciate any M/R/G Lungs: Slightly diminished bibasilar breath sounds, but otherwise CTAB, did not appreciate any wheezes, rales, or rhonchi Abdomen: + BS; soft, ND, NT Vascular: 2+ pedal pulses b/l; 2+ radial pulses b/l Extremities: Trace edema in the bilateral lower extremities, no muscle wasting Neuro: strength and sensation intact in upper and lower extremities b/l    Labs on Admission: I have personally reviewed following labs and imaging studies  CBC: Recent Labs  Lab 08/08/23 1645 08/08/23 1708 08/08/23 1709  WBC 8.6  --   --   NEUTROABS 5.8  --   --   HGB 9.8* 10.9* 11.2*  HCT 32.1* 32.0* 33.0*  MCV 104.6*  --   --   PLT 223  --   --    Basic Metabolic Panel: Recent Labs  Lab 08/08/23 1645 08/08/23 1708 08/08/23 1709  NA 135 139 138  K 4.3 4.4 4.4  CL 101 103  --   CO2 23  --   --   GLUCOSE 149* 145*  --   BUN 22 24*  --   CREATININE 2.06* 2.10*  --   CALCIUM 8.4*  --   --    GFR: Estimated Creatinine Clearance: 22.4 mL/min (A) (by C-G formula based on SCr of 2.1 mg/dL (H)). Liver Function Tests: Recent Labs  Lab 08/08/23 1645  AST 310*  ALT 194*  ALKPHOS 87  BILITOT 1.4*  PROT 6.8  ALBUMIN 3.3*   No results for input(s): "LIPASE", "AMYLASE" in the last 168 hours. No results for input(s): "AMMONIA" in the last 168 hours. Coagulation Profile: No results for input(s): "INR", "PROTIME" in the last 168 hours. Cardiac Enzymes: No results for input(s): "CKTOTAL", "CKMB", "CKMBINDEX", "TROPONINI" in the last 168 hours. BNP (last 3 results) No results for input(s): "PROBNP" in the last 8760 hours. HbA1C: No results for input(s):  "HGBA1C" in the last 72 hours. CBG: No results  for input(s): "GLUCAP" in the last 168 hours. Lipid Profile: No results for input(s): "CHOL", "HDL", "LDLCALC", "TRIG", "CHOLHDL", "LDLDIRECT" in the last 72 hours. Thyroid Function Tests: No results for input(s): "TSH", "T4TOTAL", "FREET4", "T3FREE", "THYROIDAB" in the last 72 hours. Anemia Panel: No results for input(s): "VITAMINB12", "FOLATE", "FERRITIN", "TIBC", "IRON", "RETICCTPCT" in the last 72 hours. Urine analysis:    Component Value Date/Time   COLORURINE YELLOW 06/17/2022 0608   APPEARANCEUR HAZY (A) 06/17/2022 0608   LABSPEC 1.018 06/17/2022 0608   PHURINE 5.0 06/17/2022 0608   GLUCOSEU NEGATIVE 06/17/2022 0608   HGBUR NEGATIVE 06/17/2022 0608   BILIRUBINUR NEGATIVE 06/17/2022 0608   BILIRUBINUR negative 06/04/2016 0847   BILIRUBINUR neg 02/14/2015 1431   KETONESUR NEGATIVE 06/17/2022 0608   PROTEINUR NEGATIVE 06/17/2022 0608   UROBILINOGEN 0.2 06/04/2016 0847   NITRITE NEGATIVE 06/17/2022 0608   LEUKOCYTESUR NEGATIVE 06/17/2022 1610    Radiological Exams on Admission: DG Chest Portable 1 View Result Date: 08/08/2023 CLINICAL DATA:  Shortness of breath.  Status post thoracentesis. EXAM: PORTABLE CHEST 1 VIEW COMPARISON:  Chest radiograph dated 08/08/2023 at 5:34 p.m. FINDINGS: Lines/tubes: Left chest wall ICD leads project over the right atrium and ventricle and tributary of the coronary sinus. Lungs: Low lung volumes. Bilateral interstitial opacities. Bibasilar patchy opacities. Pleura: Small right and decreased small left pleural effusions. No pneumothorax. Heart/mediastinum: Similar enlarged cardiomediastinal silhouette. Bones: No acute osseous abnormality. IMPRESSION: 1. Small right and decreased small left pleural effusions. No pneumothorax. 2. Bilateral interstitial opacities and bibasilar patchy opacities, likely pulmonary edema. Electronically Signed   By: Agustin Cree M.D.   On: 08/08/2023 21:12   CT CHEST ABDOMEN  PELVIS WO CONTRAST Result Date: 08/08/2023 CLINICAL DATA:  Sepsis.  Recent fall. EXAM: CT CHEST, ABDOMEN AND PELVIS WITHOUT CONTRAST TECHNIQUE: Multidetector CT imaging of the chest, abdomen and pelvis was performed following the standard protocol without IV contrast. RADIATION DOSE REDUCTION: This exam was performed according to the departmental dose-optimization program which includes automated exposure control, adjustment of the mA and/or kV according to patient size and/or use of iterative reconstruction technique. COMPARISON:  Radiograph earlier today. 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 Aortic atherosclerosis without aneurysm. The heart is mildly enlarged. No pericardial effusion. Mediastinum/Nodes: Lack of contrast limits hilar assessment. No gross mediastinal adenopathy. No mediastinal hemorrhage. Decompressed esophagus, no pneumomediastinum. Subcentimeter thyroid nodules. Not clinically significant; no follow-up imaging recommended (ref: J Am Coll Radiol. 2015 Feb;12(2): 143-50). Lungs/Pleura: Large left and moderate right pleural effusion, simple fluid density. Complete collapse/compressive atelectasis of the left lower lobe with subtotal atelectasis of the anterior left upper lobe. Only small portion of left upper lobe is aerated in the hemithorax. No evidence of endobronchial or tracheal debris or obstructing lesion. Compressive atelectasis in the dependent right lung. Perifissural atelectasis in the right upper and middle lobes. No evidence of pulmonary mass. Musculoskeletal: There are no acute or suspicious osseous abnormalities. Age related degenerative change in the spine. Bilateral breast implants which are partially calcified. CT ABDOMEN PELVIS FINDINGS Hepatobiliary: Subtle capsular nodularity of the liver suspicious for cirrhosis. 8 mm hypodensity in the subcapsular right lobe of the liver is unchanged from prior exam and  likely cyst. Layering sludge or stones in the gallbladder. No pericholecystic inflammation. No biliary dilatation. No evidence of hepatic injury. Pancreas: Unremarkable unenhanced appearance. Spleen: Unremarkable unenhanced appearance. 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 No further follow-up imaging is recommended. Normal right adrenal gland. No  evidence of adrenal hemorrhage or injury. Bilateral renal parenchymal thinning without hydronephrosis or focal renal abnormality on this unenhanced exam. Stomach/Bowel: Bowel assessment is limited in the absence of contrast, arms down positioning and motion. The stomach is decompressed. No evidence of bowel obstruction or inflammation. Small volume of stool in the colon. Vascular/Lymphatic: Advanced aortic and branch atherosclerosis. No aortic aneurysm. No retroperitoneal fluid or perivascular haziness to suggest injury. Scattered small retroperitoneal and periportal nodes are typically reactive. No suspicious adenopathy. Reproductive: Status post hysterectomy. No adnexal masses. Other: Small volume simple ascites in the abdomen and pelvis. No evidence of hemorrhage. No free air. Mild generalized body wall edema. Musculoskeletal: There are no acute or suspicious osseous abnormalities. Moderate degenerative change in the lumbar spine. IMPRESSION: 1. Large left pleural effusion with subtotal collapse/atelectasis of the left lung. Only small portion of the left upper lobe is aerated. Moderate-sized right pleural effusion with atelectasis throughout the right lung. 2. Small volume simple ascites in the abdomen and pelvis. Mild generalized body wall edema. 3. Subtle capsular nodularity of the liver suspicious for cirrhosis. 4. Cholelithiasis without evidence of cholecystitis. Aortic Atherosclerosis (ICD10-I70.0). Electronically Signed   By: Narda Rutherford M.D.   On: 08/08/2023 19:24   CT HEAD WO CONTRAST ( ) Result Date:  08/08/2023 CLINICAL DATA:  Trauma EXAM: CT HEAD WITHOUT CONTRAST CT MAXILLOFACIAL WITHOUT CONTRAST CT CERVICAL SPINE WITHOUT CONTRAST TECHNIQUE: Multidetector CT imaging of the head, cervical spine, and maxillofacial structures were performed using the standard protocol without intravenous contrast. Multiplanar CT image reconstructions of the cervical spine and maxillofacial structures were also generated. RADIATION DOSE REDUCTION: This exam was performed according to the departmental dose-optimization program which includes automated exposure control, adjustment of the mA and/or kV according to patient size and/or use of iterative reconstruction technique. COMPARISON:  05/01/2020 FINDINGS: CT HEAD FINDINGS Brain: There is no mass, hemorrhage or extra-axial collection. The size and configuration of the ventricles and extra-axial CSF spaces are normal. There is hypoattenuation of the white matter, most commonly indicating chronic small vessel disease. Vascular: Atherosclerotic calcification of the internal carotid arteries at the skull base. No abnormal hyperdensity of the major intracranial arteries or dural venous sinuses. Skull: The visualized skull base, calvarium and extracranial soft tissues are normal. Sinuses/Orbits: No fluid levels or advanced mucosal thickening of the visualized paranasal sinuses. No mastoid or middle ear effusion. Normal orbits. Other: None. CT MAXILLOFACIAL FINDINGS Osseous: No facial fracture or mandibular dislocation. Orbits: The globes are intact. Normal appearance of the intra- and extraconal fat. Symmetric extraocular muscles and optic nerves. Sinuses: No fluid levels or advanced mucosal thickening. Soft tissues: Normal visualized extracranial soft tissues. CT CERVICAL SPINE FINDINGS Alignment: No static subluxation. Facets are aligned. Occipital condyles and the lateral masses of C1-C2 are aligned. Skull base and vertebrae: No acute fracture. Soft tissues and spinal canal: No  prevertebral fluid or swelling. No visible canal hematoma. Disc levels: No advanced spinal canal or neural foraminal stenosis. Upper chest: Fluid filling the left hemithorax. Small right pleural effusion, incompletely visualized. Other: 1.8 cm left parotid mass. IMPRESSION: 1. No acute intracranial abnormality. 2. No facial fracture. 3. No acute fracture or static subluxation of the cervical spine. 4. Fluid filling the left hemithorax. Small right pleural effusion, incompletely visualized. 5. 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 Due to the overlap of the imaging features of benign and malignant parotid masses, histologic sampling is recommended. Electronically Signed   By: Deatra Robinson M.D.   On: 08/08/2023 19:19   CT  Maxillofacial Wo Contrast Result Date: 08/08/2023 CLINICAL DATA:  Trauma EXAM: CT HEAD WITHOUT CONTRAST CT MAXILLOFACIAL WITHOUT CONTRAST CT CERVICAL SPINE WITHOUT CONTRAST TECHNIQUE: Multidetector CT imaging of the head, cervical spine, and maxillofacial structures were performed using the standard protocol without intravenous contrast. Multiplanar CT image reconstructions of the cervical spine and maxillofacial structures were also generated. RADIATION DOSE REDUCTION: This exam was performed according to the departmental dose-optimization program which includes automated exposure control, adjustment of the mA and/or kV according to patient size and/or use of iterative reconstruction technique. COMPARISON:  05/01/2020 FINDINGS: CT HEAD FINDINGS Brain: There is no mass, hemorrhage or extra-axial collection. The size and configuration of the ventricles and extra-axial CSF spaces are normal. There is hypoattenuation of the white matter, most commonly indicating chronic small vessel disease. Vascular: Atherosclerotic calcification of the internal carotid arteries at the skull base. No abnormal hyperdensity of the major intracranial arteries or dural venous sinuses. Skull: The visualized skull base, calvarium  and extracranial soft tissues are normal. Sinuses/Orbits: No fluid levels or advanced mucosal thickening of the visualized paranasal sinuses. No mastoid or middle ear effusion. Normal orbits. Other: None. CT MAXILLOFACIAL FINDINGS Osseous: No facial fracture or mandibular dislocation. Orbits: The globes are intact. Normal appearance of the intra- and extraconal fat. Symmetric extraocular muscles and optic nerves. Sinuses: No fluid levels or advanced mucosal thickening. Soft tissues: Normal visualized extracranial soft tissues. CT CERVICAL SPINE FINDINGS Alignment: No static subluxation. Facets are aligned. Occipital condyles and the lateral masses of C1-C2 are aligned. Skull base and vertebrae: No acute fracture. Soft tissues and spinal canal: No prevertebral fluid or swelling. No visible canal hematoma. Disc levels: No advanced spinal canal or neural foraminal stenosis. Upper chest: Fluid filling the left hemithorax. Small right pleural effusion, incompletely visualized. Other: 1.8 cm left parotid mass. IMPRESSION: 1. No acute intracranial abnormality. 2. No facial fracture. 3. No acute fracture or static subluxation of the cervical spine. 4. Fluid filling the left hemithorax. Small right pleural effusion, incompletely visualized. 5. 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 Due to the overlap of the imaging features of benign and malignant parotid masses, histologic sampling is recommended. Electronically Signed   By: Deatra Robinson M.D.   On: 08/08/2023 19:19   CT Cervical Spine Wo Contrast Result Date: 08/08/2023 CLINICAL DATA:  Trauma EXAM: CT HEAD WITHOUT CONTRAST CT MAXILLOFACIAL WITHOUT CONTRAST CT CERVICAL SPINE WITHOUT CONTRAST TECHNIQUE: Multidetector CT imaging of the head, cervical spine, and maxillofacial structures were performed using the standard protocol without intravenous contrast. Multiplanar CT image reconstructions of the cervical spine and maxillofacial structures were also generated. RADIATION DOSE  REDUCTION: This exam was performed according to the departmental dose-optimization program which includes automated exposure control, adjustment of the mA and/or kV according to patient size and/or use of iterative reconstruction technique. COMPARISON:  05/01/2020 FINDINGS: CT HEAD FINDINGS Brain: There is no mass, hemorrhage or extra-axial collection. The size and configuration of the ventricles and extra-axial CSF spaces are normal. There is hypoattenuation of the white matter, most commonly indicating chronic small vessel disease. Vascular: Atherosclerotic calcification of the internal carotid arteries at the skull base. No abnormal hyperdensity of the major intracranial arteries or dural venous sinuses. Skull: The visualized skull base, calvarium and extracranial soft tissues are normal. Sinuses/Orbits: No fluid levels or advanced mucosal thickening of the visualized paranasal sinuses. No mastoid or middle ear effusion. Normal orbits. Other: None. CT MAXILLOFACIAL FINDINGS Osseous: No facial fracture or mandibular dislocation. Orbits: The globes are intact. Normal appearance  of the intra- and extraconal fat. Symmetric extraocular muscles and optic nerves. Sinuses: No fluid levels or advanced mucosal thickening. Soft tissues: Normal visualized extracranial soft tissues. CT CERVICAL SPINE FINDINGS Alignment: No static subluxation. Facets are aligned. Occipital condyles and the lateral masses of C1-C2 are aligned. Skull base and vertebrae: No acute fracture. Soft tissues and spinal canal: No prevertebral fluid or swelling. No visible canal hematoma. Disc levels: No advanced spinal canal or neural foraminal stenosis. Upper chest: Fluid filling the left hemithorax. Small right pleural effusion, incompletely visualized. Other: 1.8 cm left parotid mass. IMPRESSION: 1. No acute intracranial abnormality. 2. No facial fracture. 3. No acute fracture or static subluxation of the cervical spine. 4. Fluid filling the left  hemithorax. Small right pleural effusion, incompletely visualized. 5. 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 Due to the overlap of the imaging features of benign and malignant parotid masses, histologic sampling is recommended. Electronically Signed   By: Deatra Robinson M.D.   On: 08/08/2023 19:19   DG Hand 2 View Left Result Date: 08/08/2023 CLINICAL DATA:  Recent fall with left hand pain, initial encounter EXAM: LEFT HAND - 2 VIEW COMPARISON:  None Available. FINDINGS: There is no evidence of fracture or dislocation. There is no evidence of arthropathy or other focal bone abnormality. Soft tissues are unremarkable. IMPRESSION: No acute abnormality noted. Electronically Signed   By: Alcide Clever M.D.   On: 08/08/2023 18:12   DG Hand 2 View Right Result Date: 08/08/2023 CLINICAL DATA:  Recent fall with right hand pain, initial encounter EXAM: RIGHT HAND - 2 VIEW COMPARISON:  None Available. FINDINGS: There is no evidence of fracture or dislocation. There is no evidence of arthropathy or other focal bone abnormality. Soft tissues are unremarkable. IMPRESSION: No acute abnormality noted. Electronically Signed   By: Alcide Clever M.D.   On: 08/08/2023 18:12   DG Chest Port 1 View Result Date: 08/08/2023 CLINICAL DATA:  Recent fall with chest pain, initial encounter EXAM: PORTABLE CHEST 1 VIEW COMPARISON:  07/20/2023 FINDINGS: Cardiac shadow is enlarged. Defibrillator is again noted and stable. Aortic calcifications are seen. Patient rotation to the right is noted accentuating the mediastinal markings. The lungs are well aerated. Small left pleural effusion remains. No new focal abnormality is noted. IMPRESSION: No significant change from the prior exam. Electronically Signed   By: Alcide Clever M.D.   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) Active Problems:   Essential hypertension   CML (chronic myelocytic leukemia) (HCC)    Transaminitis   Recurrent left pleural effusion   Acquired hypothyroidism   Paroxysmal ventricular tachycardia (HCC)   Acute renal failure superimposed on stage 3b chronic kidney disease (HCC)   Acute hypoxic respiratory failure (HCC)   Fall at home, initial encounter   Generalized weakness   Elevated troponin   Anemia of chronic disease    #) Acute on chronic diastolic heart failure: dx of acute decompensation on the basis of presenting 2 days of progressive shortness of breath associate with orthopnea, worsening of peripheral edema, elevation in BNP on par with degree of elevation in BNP at time of most recent prior hospitalization for acute on chronic diastolic heart failure, with imaging showing evidence of bilateral pleural effusions as well as evidence of pulmonary edema. This is in the context of a known history of chronic diastolic heart failure, with most recent echocardiogram performed on 07/18/2023, which was  notable for LVEF 60 to 65%, no focal Measurin values, and normal right ventricular systolic function, moderate pericardial effusion and trivial mitral regurgitation. Etiology leading to presenting acutely decompensated heart failure is not entirely clear at this time, with patient conveying good compliance with her outpatient torsemide 20 mg p.o. daily.   I suspect that mildly elevated initial troponin is a consequence of underlying acutely decompensated heart failure as opposed to representing ACS causing presenting acute heart failure exacerbation, particularly in the absence of any recent CP, and with presenting EKG showing no evidence of acute ischemic changes.  Suspect additional contributions towards elevated troponin from type II supply/demand mismatch resulting from diminished oxygen delivery capacity as a result of presenting acute hypoxic respiratory failure, as well as contribution from diminished renal clearance of troponin in the setting of presenting acute kidney injury  superimposed on CKD 3B. however, will continue to evaluate with further trending of troponin and close monitoring on tele.   Will pursue IV diuresis, as outlined below. As patient is already on a BB at home, will plan to continue this.  Of note, patient did recently undergo echocardiogram, will pursue updated echocardiogram in the morning, in part to evaluate degree of her previously noted moderate pericardial effusion, which, in setting of further progression, could impart ensuing preload dependent pathophysiology, which could modify plan regarding IV diuresis.   Plan: monitor strict I's & O's and daily weights. Monitor on telemetry. CMP in the morning, including for monitoring trend of potassium, bicarbonate, and renal function in response to interval diuresis efforts. Add-on serum magnesium level, and repeat this level in the AM. Close monitoring of ensuing blood pressure response to diuresis efforts, including to help guide need for improvement in afterload reduction in order to optimize cardiac output. Trend troponin. Lasix 40 mg IV twice daily, which is a conservative initial dose given her degree of outpatient torsemide, which may be altered pending results of ensuing echocardiogram ordered to evaluate degree of previously noted pericardial effusion, as above.  Continue outpatient metoprolol succinate, as above.  In the setting of her acute kidney injury superimposed on CKD 3B, will hold home daily potassium chloride supplementation for now.                    #) Acute hypoxic respiratory failure: in the context of acute respiratory symptoms and no known baseline supplemental O2 requirements, presenting O2 sat was reported to be in the high 40s on room air with associated good with form, with ensuing improvement to 100% on 6 L nasal cannula.  Following interval diagnostic/therapeutic left-sided thoracentesis, supplemental oxygen has been weaned to 2 L nasal cannula with ensuing oxygen  saturations in the high 90s.  Appears to be potentially multifactorial in etiology, with suspected chronic contribution from acute on chronic diastolic heart failure, as above, along with additional contribution from large left-sided pleural effusion, now status post diagnostic/therapeutic left-sided thoracentesis, with pleural fluid analysis results currently pending at this time.  Post left-sided thoracentesis chest x-ray shows no evidence of pneumothorax, as well as significant improvement in degree of size of left-sided pleural effusion, now with small residual noted.  This chest x-ray also shows evidence of patchy opacities consistent with pulmonary edema in the setting of her presenting acute on chronic diastolic heart failure.  No known history of chronic underlying pulmonary pathology and this lifelong non-smoker.  Clinically, presentation appears less suggestive of acute pulmonary embolism.  As noted above, mildly elevated initial troponin appears more consistent  with type II supply/demand mismatch as a consequence of diminished oxygen delivery capacity as well as from her acute on chronic diastolic heart failure itself with contribution from diminished troponin clearance resulting from her presenting acute kidney injury superimposed on CKD 3B, as further detailed above.  Overall, ACS appears less likely.  Clinically and radiographically, pneumonia also appears less likely, in the absence of any associated infiltrate.  Additionally, she has no elevation white blood cell count and is afebrile.  Will add on procalcitonin level to further assess.  Plan: further evaluation/management of presenting acute on chronic diastolic heart failure, as above including plan for IV diuresis.  Further evaluation management of presenting large left-sided pleural effusion, including following for result of associated pleural fluid analysis, as further outlined below.  Monitor on telemetry. CMP/CBC in the AM. Check  serum Mg and Phos levels. Check blood gas. incentive spirometry.  Add on procalcitonin level.  Trend troponin.                  #) Large left-sided pleural effusion: Chest x-ray as well as CT chest, abdomen, pelvis showed a large left-sided pleural effusion with near total opacification of the left lung for which she has now undergone diagnostic/therapeutic left-sided thoracentesis performed by EDP this evening.  While this is suspected to be stemming from a contribution from her presenting acute on chronic diastolic heart failure, will follow closely for her results of associated pleural fluid analysis.  Parapneumonic effusion is felt to be less likely.  She does have a history of CML, and there appears to have been a subacute reaccumulation of her large left-sided pleural effusion, raising the possibility of underlying contribution from malignancy.  Consequently, we will add on cytology to existing pleural fluid analysis.  Considered, the possibility of chylothorax in the setting of recent preceding fall, although this appears to be less likely.  She has already undergone CMP earlier today to assess total protein level.  Plan: Follow-up results of pleural fluid analysis, including Gram stain culture, cell count with differential, pleural LDH, glucose, albumin, amylase.  Add on serum LDH.  Add on cytology.  Add on procalcitonin level.  Monitor strict I's and O's and daily weights.  Further evaluation management of presenting acute on chronic diastolic heart failure, including plan for additional IV diuresis as outlined above.                #) Ground-level mechanical fall: As a consequence of recent generalized weakness, resulting in her hitting her head without associated loss of consciousness.  He is on blood thinners in the form of daily baby aspirin.  Ensuing CT head, CT cervical spine, and CT maxillofacial showed no evidence of acute process, as further outlined  above.  Plan: Fall precautions.  PT/OT consults are for the morning.  Further evaluation management of presenting generalized weakness, as below.                  #) Generalized weakness: 3 to 4-day duration of generalized weakness, in the absence of any evidence of acute focal neurologic deficits, including no evidence of acute focal weakness to suggest acute CVA, while CT head shows no evidence of acute intracranial process, including no evidence of acute infarct.  Suspect contribution from physiologic stress stemming from presenting acute hypoxic respiratory failure in the setting of acute on chronic diastolic heart failure.  No e/o additional infectious process at this time, including no evidence of infiltrate on chest x-ray to suggest pneumonia.  Will add on procalcitonin level to further assess and also check urinalysis.  Will further eval for any additional contributions from endocrine/metabolic sources, as detailed below.   Plan: work-up and management of presenting acute hypoxic respiratory failure in the setting of acute on chronic diastolic heart failure, as above, as described above. PT/OT consults ordered for the AM. Fall precautions. CMP/CBC in the AM. Check TSH, B12, serum Mg level. Check urinalysis, procalcitonin level.                    #) Acute Kidney Injury superimposed on CKD 3B: In the setting of history of baseline creatinine range of 1.2-1.6, with most recent prior creatinine data point noted to be 1.6 on 07/20/23, this evening's creatinine is found to be 2.06.  Prerenal multifactorial prerenal contributions, including diminished renal perfusion as result of decline in oxygen delivery capacity stemming from her presenting acute hypoxic respiratory failure, as well as contribution from decline in renal perfusion gradient as a consequence of presenting acute on chronic diastolic heart failure.  CT abdomen/pelvis shows no evidence of postrenal  obstructive source.  Will pursue additional diagnostic evaluation as outlined below, while pursuing IV diuresis and close monitoring of ensuing renal function trend.  Plan: monitor strict I's & O's and daily weights. Attempt to avoid nephrotoxic agents. Refrain from NSAIDs. Repeat CMP in the morning. Check serum magnesium level.  Check urinalysis with microscopy.  Add-on random urine sodium and random urine creatinine.  IV diuresis, as above.  Further evaluation management of acute hypoxic respiratory failure in the setting of acute on chronic diastolic heart failure, as above.                #) Acute transaminitis: Relative to most recent prior set of liver enzymes performed on 07/17/2023, this evening's liver enzymes reflect interval increase in AST and ALT, as quantified above, suggesting hepatocellular process, will noting no significant elevation in alkaline phosphatase or total bilirubin to suggest any element of biliary obstruction.  Additionally, today CT abdomen/pelvis shows cholelithiasis, without any evidence of gallbladder wall thickening, pericholecystic fluid, gallbladder distention, choledocholithiasis, or dilation of the common bile duct.  Differential for interval development of acute transaminitis includes potential congestive hepatopathy given presenting acute on chronic diastolic heart failure.  She has also been experiencing some nausea/vomiting, and while there appears to be an element of chronicity to this, will also check acute viral hepatitis panel, while noting that he AST predominance associated with her transaminitis likely speaks against this possibility.  There may also be contribution from diminished hepatic perfusion as a result of decline in oxygen delivery capacity as a consequence of her presenting acute hypoxic respiratory failure.  Plan: Repeat CMP in the morning.  Check INR, PTT.  Check serum acetaminophen level, acute viral hepatitis panel, UDS, serum  ethanol level.  Further evaluation management of acute hypoxic respiratory failure in the setting of acute on chronic diastolic heart failure, with plan for additional IV diuresis as outlined above.                #) Chronic myelogenous leukemia: Documented history of such, daily Gleevac.  Complicated by chronic anemia with baseline hemoglobin 7.5-10, with presenting hemoglobin consistent with this range, in the absence of any evidence of acute blood loss.  Plan: Repeat CBC in the morning.  Continue home Gleevec.                   #) Essential Hypertension: documented h/o such,  with outpatient antihypertensive regimen including metoprolol succinate, torsemide.  SBP's in the ED today: 150s to 180s mmHg.   Plan: Close monitoring of subsequent BP via routine VS. holding home torsemide and lieu of IV Lasix, as above.  Continue outpatient metoprolol succinate.                  #) History of paroxysmal ventricular tachycardia: Documented history of such, for which she is on amiodarone.  Appears to be in sinus rhythm at this time.  Plan: Continue outpatient amiodarone.  Add on serum magnesium level.  Monitor on telemetry.                 #) acquired hypothyroidism: documented h/o such, on Armour Thyroid supplementation as outpatient.   Plan: cont home Armour Thyroid.  Add on TSH level in the setting of her presenting generalized weakness.     DVT prophylaxis: SCD's   Code Status: Full code Family Communication: none Disposition Plan: Per Rounding Team Consults called: none;  Admission status: Inpatient     I SPENT GREATER THAN 75  MINUTES IN CLINICAL CARE TIME/MEDICAL DECISION-MAKING IN COMPLETING THIS ADMISSION.      Chaney Born Earlie Arciga DO [facility]  From 7PM - 7AM   08/08/2023, 10:00 PM

## 2023-08-08 NOTE — ED Provider Notes (Signed)
[place] [facility] AT [facility] Provider Note  HPI   Lisa [name] is a 82 y.o. female patient with a PMHx of hypertension, coronary artery disease, chronic diastolic heart failure, CML, melanoma, previous skin graft on the right leg, chronic lymphedema both legs who is here today with concern for shortness of breath and hypoxia  Patient has chronic nausea and vomiting, distention has been worse over the past 5 days since Christmas.  A few days ago, she mechanical fall, landed on her face, required assistance getting up, EMS was called, she denied coming to the hospital.  Today, she called the ambulance because she is feeling significantly short of breath  She denies chest pain fevers chills abdominal pain back pain  ROS Negative except as per HPI   Medical Decision Making   Upon presentation, the patient is hypoxic to 48 with good waveform on room air, this improved to the mid 90s on just a few liters of nasal cannula.  She had initially having some perioral cyanosis  Respiratory therapist was called, we uptitrated the patient's nasal cannula to about 6 L, was getting high flow humidified through the nasal cannula.  We did not initially put her on a nonrebreather or provide  Ventilation she was oriented throughout the entire time, was talking, however she was also vomiting during this time.  Suction was applied.  Will just come to the patient workup, pH is 7.23 with a pCO2 60.2, troponins 39-46, does appear to have an AKI creatinine is 2.10, BNP elevated 804, hemoglobin 9.8 no leukocytosis no fevers.  Chest x-ray is read as a small pleural effusion but to me looks like a large pleural effusion.  ECG shows as tolerated junctional rhythm  1. Large left pleural effusion with subtotal collapse/atelectasis of  the left lung. Only small portion of the left upper lobe is aerated.  Moderate-sized right pleural effusion with atelectasis throughout  the right lung.   2. Small volume simple ascites in the abdomen and pelvis. Mild  generalized body wall edema.  3. Subtle capsular nodularity of the liver suspicious for cirrhosis.  4. Cholelithiasis without evidence of cholecystitis.    Aortic Atherosclerosis (ICD10-I70.0).    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 Due to the overlap of the imaging  features of benign and malignant parotid masses, histologic sampling  is recommended.    We performed a thoracentesis, assisted with fentanyl IV, gave her some Reglan for nausea as well prior to the CT scans.  Her oxygen level has been good, it is improved now to 2 L after the thoracentesis.  Pelvic drain  .  I spoke to the medicine team, we will be admitting this patient for a heart exacerbation, AKI, I did give her a little bit of fluid as her blood pressure went down from 150s 160s to about 100 systolic after the thoracentesis, most likely due to fluid shifts.    Clinical Course as of 08/08/23 2246  Sun Aug 08, 2023  1635 hypertension, coronary artery disease, chronic diastolic heart failure, CML, melanoma, previous skin graft on the right leg, chronic lymphedema both legs [JL]    Clinical Course User Index [JL] Gunnar Bulla, MD     1. Pleural effusion     @DISPOSITION @  Rx / DC Orders ED Discharge Orders     None        Past Medical History:  Diagnosis Date   CAD in native artery 2017   stent to LAD  Cardiac arrest (HCC) 09/2015   CHF (congestive heart failure) (HCC)    CML (chronic myelocytic leukemia) (HCC)    Complication of anesthesia    Hx: UTI (urinary tract infection)    Hypertension    Melanoma (HCC)    PONV (postoperative nausea and vomiting)    PVC (premature ventricular contraction)    HISTORY OF   Thyroid disease    V tach (HCC)    Past Surgical History:  Procedure Laterality Date   ABDOMINAL HYSTERECTOMY     APPENDECTOMY     CARDIAC CATHETERIZATION N/A 10/03/2015   Procedure: Left Heart Cath and Coronary Angiography;   Surgeon: Peter M Swaziland, MD;  Location: [facility] INVASIVE CV LAB;  Service: Cardiovascular;  Laterality: N/A;   CARDIAC CATHETERIZATION N/A 10/07/2015   Procedure: Coronary Stent Intervention;  Surgeon: Kathleene Hazel, MD;  Location: [facility] INVASIVE CV LAB;  Service: Cardiovascular;  Laterality: N/A;   CARDIAC CATHETERIZATION  09/22/2019   EP IMPLANTABLE DEVICE N/A 01/20/2016   Procedure: BiV ICD Insertion CRT-D;  Surgeon: Marinus Maw, MD;  Location: [facility] INVASIVE CV LAB;  Service: Cardiovascular;  Laterality: N/A;   IR THORACENTESIS ASP PLEURAL SPACE W/IMG GUIDE  07/20/2023   LEFT HEART CATH AND CORONARY ANGIOGRAPHY N/A 09/22/2019   Procedure: LEFT HEART CATH AND CORONARY ANGIOGRAPHY;  Surgeon: Swaziland, Peter M, MD;  Location: [facility] INVASIVE CV LAB;  Service: Cardiovascular;  Laterality: N/A;   TONSILLECTOMY     TONSILLECTOMY     TUBAL LIGATION     Family History  Problem Relation Age of Onset   Coronary artery disease Mother    Thyroid disease Mother    Multiple myeloma Father    Heart attack Maternal Grandfather    Heart attack Paternal Grandfather    Social History   Socioeconomic History   Marital status: Divorced    Spouse name: Not on file   Number of children: Not on file   Years of education: Not on file   Highest education level: Not on file  Occupational History   Not on file  Tobacco Use   Smoking status: Never   Smokeless tobacco: Never  Vaping Use   Vaping status: Never Used  Substance and Sexual Activity   Alcohol use: No    Alcohol/week: 0.0 standard drinks of alcohol   Drug use: No   Sexual activity: Not on file  Other Topics Concern   Not on file  Social History Narrative   Not on file   Social Drivers of Health   Financial Resource Strain: Low Risk  (10/28/2022)   Received from [facility], [facility]   Overall Financial Resource Strain (CARDIA)    Difficulty of Paying Living Expenses: Not hard at all  Food Insecurity: No Food Insecurity (07/17/2023)    Hunger Vital Sign    Worried About Running Out of Food in the Last Year: Never true    Ran Out of Food in the Last Year: Never true  Transportation Needs: No Transportation Needs (07/17/2023)   PRAPARE - Administrator, Civil Service (Medical): No    Lack of Transportation (Non-Medical): No  Physical Activity: Not on file  Stress: Not on file  Social Connections: Unknown (04/27/2022)   Received from [facility], [facility]   Social Network    Social Network: Not on file  Intimate Partner Violence: Not At Risk (07/17/2023)   Humiliation, Afraid, Rape, and Kick questionnaire    Fear of Current or Ex-Partner: No  Emotionally Abused: No    Physically Abused: No    Sexually Abused: No     Physical Exam   Vitals:   08/08/23 1815 08/08/23 2000 08/08/23 2130 08/08/23 2144  BP: (!) 188/82 (!) 175/82 (!) 154/76   Pulse: 69  62   Resp: 19 18 20    Temp:    98.2 F (36.8 C)  TempSrc:    Oral  SpO2: 100% 100% 100%     Physical Exam Vitals and nursing note reviewed.  Constitutional:      General: She is in acute distress.     Appearance: She is ill-appearing.  HENT:     Head: Normocephalic and atraumatic.     Nose: Nose normal.     Mouth/Throat:     Mouth: Mucous membranes are moist.  Eyes:     Extraocular Movements: Extraocular movements intact.  Cardiovascular:     Rate and Rhythm: Normal rate.  Pulmonary:     Comments: Patient has diminished breath sounds on the left side compared to the right but diminished aeration bilaterally.  Patient is turning blue in the face, despite this patient is able to talk Abdominal:     Palpations: Abdomen is soft.     Tenderness: There is no abdominal tenderness.  Musculoskeletal:     Cervical back: Neck supple.     Comments: Patient has significant lower extremity edema bilaterally up to the knees, feet are swollen  Skin:    General: Skin is warm and dry.  Neurological:     General: No focal deficit present.     Mental  Status: She is alert and oriented to person, place, and time.      Procedures   If procedures were preformed on this patient, they are listed below:  THORACENTESIS BEDSIDE  Date/Time: 08/08/2023 10:27 PM  Performed by: Gunnar Bulla, MD Authorized by: Charlynne Pander, MD   Consent:    Consent obtained:  Verbal and written   Consent given by:  Patient   Risks discussed:  Bleeding, incomplete drainage, infection, nerve damage, pain and pneumothorax   Alternatives discussed:  No treatment Universal protocol:    Patient identity confirmed:  Arm band, verbally with patient and hospital-assigned identification number Sedation:    Sedation type:  None Anesthesia:    Anesthesia method:  Topical application and local infiltration   Local anesthetic:  Lidocaine 1% WITH epi Procedure details:    Preparation: Patient was prepped and draped in usual sterile fashion     Patient position:  Sitting   Location:  L midscapular line   Intercostal space: US Guided.   Puncture method:  Over-the-needle catheter   Ultrasound guidance: yes     Indwelling catheter placed: no     Needle gauge:  18   Catheter size:  8 Fr   Number of attempts:  1 Post-procedure details:    Chest x-ray performed: yes     Chest x-ray findings:  Pleural effusion improved   Procedure completion:  Tolerated   The patient was seen, evaluated, and treated in conjunction with the attending physician, who voiced agreement in the care provided.  Note generated using Dragon voice dictation software and may contain dictation errors. Please contact me for any clarification or with any questions.   Electronically signed by:  Osvaldo Shipper, M.D. (PGY-2)    Gunnar Bulla, MD 08/08/23 2246    Charlynne Pander, MD 08/09/23 [phone]

## 2023-08-08 NOTE — ED Triage Notes (Addendum)
Pt arrives from home for the c/o nausea and vomiting x 5 days. Pt states this started around christmas, unable to keep anything down, 02 per ems 50 on RA. Placed on non rebriether by ems. Bp and hr stable. Pt is alert and oriented x 4. Pt also fell 2 days ago, called ems but did not go to er. Noticeable trauma / black eyes to face, bleeding and bruising noted to right hand

## 2023-08-09 ENCOUNTER — Inpatient Hospital Stay (HOSPITAL_COMMUNITY): Payer: Medicare Other

## 2023-08-09 DIAGNOSIS — W19XXXA Unspecified fall, initial encounter: Secondary | ICD-10-CM | POA: Insufficient documentation

## 2023-08-09 DIAGNOSIS — R531 Weakness: Secondary | ICD-10-CM | POA: Insufficient documentation

## 2023-08-09 DIAGNOSIS — J9601 Acute respiratory failure with hypoxia: Secondary | ICD-10-CM | POA: Insufficient documentation

## 2023-08-09 DIAGNOSIS — I5033 Acute on chronic diastolic (congestive) heart failure: Secondary | ICD-10-CM | POA: Diagnosis not present

## 2023-08-09 DIAGNOSIS — D638 Anemia in other chronic diseases classified elsewhere: Secondary | ICD-10-CM | POA: Insufficient documentation

## 2023-08-09 DIAGNOSIS — R7989 Other specified abnormal findings of blood chemistry: Secondary | ICD-10-CM | POA: Insufficient documentation

## 2023-08-09 LAB — CBC WITH DIFFERENTIAL/PLATELET
Abs Immature Granulocytes: 0.04 10*3/uL (ref 0.00–0.07)
Basophils Absolute: 0 10*3/uL (ref 0.0–0.1)
Basophils Relative: 0 %
Eosinophils Absolute: 0 10*3/uL (ref 0.0–0.5)
Eosinophils Relative: 0 %
HCT: 25.8 % — ABNORMAL LOW (ref 36.0–46.0)
Hemoglobin: 8.1 g/dL — ABNORMAL LOW (ref 12.0–15.0)
Immature Granulocytes: 1 %
Lymphocytes Relative: 13 %
Lymphs Abs: 0.9 10*3/uL (ref 0.7–4.0)
MCH: 31.6 pg (ref 26.0–34.0)
MCHC: 31.4 g/dL (ref 30.0–36.0)
MCV: 100.8 fL — ABNORMAL HIGH (ref 80.0–100.0)
Monocytes Absolute: 0.5 10*3/uL (ref 0.1–1.0)
Monocytes Relative: 7 %
Neutro Abs: 5.5 10*3/uL (ref 1.7–7.7)
Neutrophils Relative %: 79 %
Platelets: 158 10*3/uL (ref 150–400)
RBC: 2.56 MIL/uL — ABNORMAL LOW (ref 3.87–5.11)
RDW: 15.8 % — ABNORMAL HIGH (ref 11.5–15.5)
WBC: 6.9 10*3/uL (ref 4.0–10.5)
nRBC: 0 % (ref 0.0–0.2)

## 2023-08-09 LAB — BODY FLUID CELL COUNT WITH DIFFERENTIAL
Lymphs, Fluid: 74 %
Monocyte-Macrophage-Serous Fluid: 22 % — ABNORMAL LOW (ref 50–90)
Neutrophil Count, Fluid: 4 % (ref 0–25)
Other Cells, Fluid: 0 %
Total Nucleated Cell Count, Fluid: 152 uL (ref 0–1000)

## 2023-08-09 LAB — ECHOCARDIOGRAM COMPLETE
Area-P 1/2: 3.86 cm2
Calc EF: 59.5 %
S' Lateral: 4.3 cm
Single Plane A2C EF: 64.1 %
Single Plane A4C EF: 55.6 %

## 2023-08-09 LAB — VITAMIN B12
Vitamin B-12: 1167 pg/mL — ABNORMAL HIGH (ref 180–914)
Vitamin B-12: 1119 pg/mL — ABNORMAL HIGH (ref 180–914)

## 2023-08-09 LAB — FOLATE: Folate: 10.8 ng/mL (ref >5.9)

## 2023-08-09 LAB — IRON AND TIBC
Iron: 62 ug/dL (ref 28–170)
Saturation Ratios: 29 % (ref 10.4–31.8)
TIBC: 214 ug/dL — ABNORMAL LOW (ref 250–450)
UIBC: 152 ug/dL

## 2023-08-09 LAB — URINALYSIS, COMPLETE (UACMP) WITH MICROSCOPIC
Bilirubin Urine: NEGATIVE
Glucose, UA: NEGATIVE mg/dL
Hgb urine dipstick: NEGATIVE
Ketones, ur: NEGATIVE mg/dL
Nitrite: NEGATIVE
Protein, ur: NEGATIVE mg/dL
Specific Gravity, Urine: 1.009 (ref 1.005–1.030)
pH: 5 (ref 5.0–8.0)

## 2023-08-09 LAB — ACETAMINOPHEN LEVEL: Acetaminophen (Tylenol), Serum: <10 ug/mL — ABNORMAL LOW (ref 10–30)

## 2023-08-09 LAB — COMPREHENSIVE METABOLIC PANEL
ALT: 204 U/L — ABNORMAL HIGH (ref 0–44)
AST: 318 U/L — ABNORMAL HIGH (ref 15–41)
Albumin: 2.6 g/dL — ABNORMAL LOW (ref 3.5–5.0)
Alkaline Phosphatase: 66 U/L (ref 38–126)
Anion gap: 8 (ref 5–15)
BUN: 23 mg/dL (ref 8–23)
CO2: 26 mmol/L (ref 22–32)
Calcium: 8 mg/dL — ABNORMAL LOW (ref 8.9–10.3)
Chloride: 105 mmol/L (ref 98–111)
Creatinine, Ser: 2.05 mg/dL — ABNORMAL HIGH (ref 0.44–1.00)
GFR, Estimated: 24 mL/min — ABNORMAL LOW (ref >60)
Glucose, Bld: 113 mg/dL — ABNORMAL HIGH (ref 70–99)
Potassium: 4.3 mmol/L (ref 3.5–5.1)
Sodium: 139 mmol/L (ref 135–145)
Total Bilirubin: 1.2 mg/dL — ABNORMAL HIGH (ref <1.2)
Total Protein: 5.3 g/dL — ABNORMAL LOW (ref 6.5–8.1)

## 2023-08-09 LAB — BLOOD GAS, VENOUS
Acid-base deficit: 1 mmol/L (ref 0.0–2.0)
Bicarbonate: 26.8 mmol/L (ref 20.0–28.0)
O2 Saturation: 95.9 %
Patient temperature: 37
pCO2, Ven: 57 mm[Hg] (ref 44–60)
pH, Ven: 7.28 (ref 7.25–7.43)
pO2, Ven: 75 mm[Hg] — ABNORMAL HIGH (ref 32–45)

## 2023-08-09 LAB — LACTATE DEHYDROGENASE: LDH: 460 U/L — ABNORMAL HIGH (ref 98–192)

## 2023-08-09 LAB — FERRITIN: Ferritin: 675 ng/mL — ABNORMAL HIGH (ref 11–307)

## 2023-08-09 LAB — RETICULOCYTES
Immature Retic Fract: 28.4 % — ABNORMAL HIGH (ref 2.3–15.9)
RBC.: 2.52 MIL/uL — ABNORMAL LOW (ref 3.87–5.11)
Retic Count, Absolute: 75.9 10*3/uL (ref 19.0–186.0)
Retic Ct Pct: 3 % (ref 0.4–3.1)

## 2023-08-09 LAB — SODIUM, URINE, RANDOM: Sodium, Ur: 98 mmol/L

## 2023-08-09 LAB — PROTEIN, PLEURAL OR PERITONEAL FLUID: Total protein, fluid: 3.6 g/dL

## 2023-08-09 LAB — MAGNESIUM
Magnesium: 2 mg/dL (ref 1.7–2.4)
Magnesium: 1.8 mg/dL (ref 1.7–2.4)

## 2023-08-09 LAB — PROTIME-INR
INR: 1.5 — ABNORMAL HIGH (ref 0.8–1.2)
Prothrombin Time: 18.5 s — ABNORMAL HIGH (ref 11.4–15.2)

## 2023-08-09 LAB — CREATININE, URINE, RANDOM: Creatinine, Urine: 55 mg/dL

## 2023-08-09 LAB — TSH: TSH: 2.287 u[IU]/mL (ref 0.350–4.500)

## 2023-08-09 LAB — PROCALCITONIN: Procalcitonin: <0.1 ng/mL

## 2023-08-09 LAB — ETHANOL: Alcohol, Ethyl (B): <10 mg/dL (ref <10)

## 2023-08-09 LAB — HEPATITIS PANEL, ACUTE
HCV Ab: NONREACTIVE
Hep A IgM: NONREACTIVE
Hep B C IgM: NONREACTIVE
Hepatitis B Surface Ag: NONREACTIVE

## 2023-08-09 LAB — AMYLASE, PLEURAL OR PERITONEAL FLUID: Amylase, Fluid: 26 U/L

## 2023-08-09 LAB — APTT: aPTT: 26 s (ref 24–36)

## 2023-08-09 LAB — PATHOLOGIST SMEAR REVIEW

## 2023-08-09 LAB — TROPONIN I (HIGH SENSITIVITY): Troponin I (High Sensitivity): 53 ng/L — ABNORMAL HIGH (ref <18)

## 2023-08-09 LAB — PHOSPHORUS: Phosphorus: 3.8 mg/dL (ref 2.5–4.6)

## 2023-08-09 MED ORDER — TIMOLOL MALEATE 0.5 % OP SOLN
1.0000 [drp] | Freq: Two times a day (BID) | OPHTHALMIC | Status: DC
  Administered 2023-08-09 – 2023-08-21 (×24): 1 [drp] via OPHTHALMIC
  Filled 2023-08-09: qty 5

## 2023-08-09 MED ORDER — ENOXAPARIN SODIUM 30 MG/0.3ML IJ SOSY
30.0000 mg | PREFILLED_SYRINGE | INTRAMUSCULAR | Status: DC
  Administered 2023-08-10 – 2023-08-20 (×11): 30 mg via SUBCUTANEOUS
  Filled 2023-08-09 (×11): qty 0.3

## 2023-08-09 MED ORDER — ORAL CARE MOUTH RINSE: 15.0000 mL | OROMUCOSAL | Status: DC | PRN

## 2023-08-09 MED ORDER — IMATINIB MESYLATE 400 MG PO TABS: 400.0000 mg | ORAL_TABLET | Freq: Every day | ORAL | Status: DC

## 2023-08-09 NOTE — Consult Note (Addendum)
Cardiology Consultation   Patient ID: Lisa [name] MRN: [id number]; DOB: 03/14/1941  Admit date: 08/08/2023 Date of Consult: 08/09/2023  PCP:  Eartha Inch, MD   [facility] Providers Cardiologist:  Dietrich Pates, MD  Electrophysiologist:  Lewayne Bunting, MD    Patient Profile:   Lisa [name] is a 82 y.o. female with a hx of HTN, CAD s/p DES to LAD '17, NICM s/p CRT-D, CML, LBBB, chronic anemia who is being seen 08/09/2023 for the evaluation of CHF at the request of Dr. Jomarie Longs.  History of Present Illness:   Ms. Morning is an 82 year old female with past medical history noted below.  She has been followed by Dr. Tenny Craw and Dr. Ladona Ridgel as an outpatient.  Echocardiogram 2/17 with LVEF of 25 to 30%, akinesis of the basal-mid inferior septal and apical septal myocardium.  Underwent cardiac catheterization at that time with focal 80% mid LAD stenosis treated with DES x 1.  Has a history of a nonischemic cardiomyopathy with recovery of LVEF s/p CRT-D.  She was admitted 05/2022 for sepsis due to pneumonia and had V-fib runs on ICD.  Was loaded with IV amiodarone and transitioned to p.o.  Readmitted the following month for Pseudomonas bacteremia and completed antibiotics, seen by infectious disease.  She had recently been hospitalized 12/7 with a Pseudomonas infection.  During that admission echocardiogram showed LVEF of 60 to 65%, normal RV, moderate effusion anterior to LV as well as apex consideration of chest CT to further characterize effusion, moderate pericardial effusion.  She did require a left sided thoracentesis.   She was last seen in the clinic 12/17 with Dr. Ladona Ridgel and noted to have continued problems with lymphedema and leg weeping.  She was instructed to continue on her home amiodarone, had not had any ICD shocks and normal device function.  Of note device was approaching ERI in 4 months.  Presented to the ED on 12/29 with complaints of vomiting for the past 5  days along with episodes of diarrhea. Feels like her breathing was around her baseline. She is prescribed torsemide 20mg  daily but does not take daily. Mostly every other day, sometimes every 3 days. Struggled with chronic lymphedema, but reports no weeping or redness recently. Recent mechanical fall with bruising to the right side of her face. Denies any sick contacts at home. Lives independently   In the ED her labs showed sodium 135, potassium 4.3, creatinine 2.06, albumin 3.3, AST 310, ALT 194, BNP 804, high-sensitivity troponin 39>>46, lactic acid 1.8, WBC 8.6, hemoglobin 9.8.  Chest x-ray with pulmonary edema.  CT cervical head/spine and maxillofacial with no acute intracranial abnormality, facial fracture or spinal fracture.  Fluid filling in the left hemithorax, small right pleural effusion, 1.8 cm left parotid mass.  Blood cultures obtained x 2.  Underwent left-sided paracentesis performed by EDP with removal of 1.1 L of fluid. She was admitted to internal medicine for further evaluation. Cardiology asked to evaluate.   Past Medical History:  Diagnosis Date   CAD in native artery 2017   stent to LAD   Cardiac arrest (HCC) 09/2015   CHF (congestive heart failure) (HCC)    CML (chronic myelocytic leukemia) (HCC)    Complication of anesthesia    Hx: UTI (urinary tract infection)    Hypertension    Melanoma (HCC)    PONV (postoperative nausea and vomiting)    PVC (premature ventricular contraction)    HISTORY OF   Thyroid disease  V tach [facility])     Past Surgical History:  Procedure Laterality Date   ABDOMINAL HYSTERECTOMY     APPENDECTOMY     CARDIAC CATHETERIZATION N/A 10/03/2015   Procedure: Left Heart Cath and Coronary Angiography;  Surgeon: Peter M Swaziland, MD;  Location: [facility] INVASIVE CV LAB;  Service: Cardiovascular;  Laterality: N/A;   CARDIAC CATHETERIZATION N/A 10/07/2015   Procedure: Coronary Stent Intervention;  Surgeon: Kathleene Hazel, MD;  Location: [facility] INVASIVE CV  LAB;  Service: Cardiovascular;  Laterality: N/A;   CARDIAC CATHETERIZATION  09/22/2019   EP IMPLANTABLE DEVICE N/A 01/20/2016   Procedure: BiV ICD Insertion CRT-D;  Surgeon: Marinus Maw, MD;  Location: [facility] INVASIVE CV LAB;  Service: Cardiovascular;  Laterality: N/A;   IR THORACENTESIS ASP PLEURAL SPACE W/IMG GUIDE  07/20/2023   LEFT HEART CATH AND CORONARY ANGIOGRAPHY N/A 09/22/2019   Procedure: LEFT HEART CATH AND CORONARY ANGIOGRAPHY;  Surgeon: Swaziland, Peter M, MD;  Location: [facility] INVASIVE CV LAB;  Service: Cardiovascular;  Laterality: N/A;   TONSILLECTOMY     TONSILLECTOMY     TUBAL LIGATION       Home Medications:  Prior to Admission medications   Medication Sig Start Date End Date Taking? Authorizing Provider  acetaminophen (TYLENOL) 500 MG tablet Take 500-1,000 mg by mouth every 6 (six) hours as needed for moderate pain.   Yes [provider]  allopurinol (ZYLOPRIM) 100 MG tablet Take 100 mg by mouth daily.  06/15/17  Yes [provider]  aluminum hydroxide-magnesium carbonate (GAVISCON) 95-358 MG/15ML SUSP Take 30 mLs by mouth as needed for indigestion or heartburn.   Yes [provider]  amiodarone (PACERONE) 200 MG tablet Take 1 tablet (200 mg total) by mouth daily. Patient taking differently: Take 200 mg by mouth at bedtime. 07/27/23  Yes Marinus Maw, MD  aspirin EC 81 MG tablet Take 81 mg by mouth in the morning.   Yes [provider]  atorvastatin (LIPITOR) 20 MG tablet Take 1 tablet (20 mg total) by mouth daily. 05/07/16  Yes Bensimhon, Bevelyn Buckles, MD  b complex vitamins capsule Take 1 capsule by mouth daily.   Yes [provider]  calcium carbonate (OS-CAL - DOSED IN MG OF ELEMENTAL CALCIUM) 1250 (500 Ca) MG tablet Take 1 tablet by mouth daily as needed (Calcium levels).   Yes [provider]  Cholecalciferol (VITAMIN D-3 PO) Take 5,000 Units by mouth daily.   Yes [provider]  Cyanocobalamin (VITAMIN B12 SL) Place  1 tablet under the tongue daily.   Yes [provider]  imatinib (GLEEVEC) 400 MG tablet Take 400 mg by mouth at bedtime. 02/16/17  Yes [provider]  loperamide (IMODIUM) 2 MG capsule Take 2 mg by mouth as needed for diarrhea or loose stools.   Yes [provider]  MAGNESIUM GLYCINATE PO Take 1 tablet by mouth at bedtime.   Yes [provider]  metoprolol succinate (TOPROL-XL) 25 MG 24 hr tablet Take one-half tablet by mouth daily Patient taking differently: Take 12.5 mg by mouth at bedtime. 06/03/23  Yes Graciella Freer, PA-C  Multiple Vitamin (MULTIVITAMIN WITH MINERALS) TABS tablet Take 1 tablet by mouth daily.   Yes [provider]  nitroGLYCERIN (NITROSTAT) 0.4 MG SL tablet Place 1 tablet (0.4 mg total) under the tongue every 5 (five) minutes x 3 doses as needed for chest pain. 12/24/20  Yes Pricilla Riffle, MD  potassium chloride (KLOR-CON) 10 MEQ tablet Take 1 tablet (10  mEq total) by mouth daily. Patient taking differently: Take 20 mEq by mouth daily. 06/26/22  Yes Pricilla Riffle, MD  thyroid (ARMOUR) 15 MG tablet Take 15 mg by mouth daily before breakfast.    Yes [provider]  timolol (TIMOPTIC) 0.5 % ophthalmic solution Place 1 drop into the left eye 2 (two) times daily. 07/15/23  Yes [provider]  torsemide (DEMADEX) 20 MG tablet Take 1 tablet (20 mg total) by mouth as directed. Patient taking differently: Take 20 mg by mouth as directed. Take with potassium daily 06/01/23  Yes Marinus Maw, MD  vitamin C (ASCORBIC ACID) 500 MG tablet Take 500 mg by mouth daily.   Yes [provider]    Inpatient Medications: Scheduled Meds:  amiodarone  200 mg Oral Daily   aspirin EC  81 mg Oral Daily   enoxaparin (LOVENOX) injection  30 mg Subcutaneous Q24H   furosemide  40 mg Intravenous BID   imatinib  400 mg Oral QHS   metoprolol succinate  12.5 mg Oral Daily   thyroid  15 mg Oral QAC breakfast    Continuous Infusions:  PRN Meds: acetaminophen **OR** acetaminophen, melatonin, ondansetron (ZOFRAN) IV, prochlorperazine  Allergies:    Allergies  Allergen Reactions   Flagyl [Metronidazole] Itching, Rash and Other (See Comments)    Welts, also   Penicillins Hives, Itching and Rash    Has patient had a PCN reaction causing immediate rash, facial/tongue/throat swelling, SOB or lightheadedness with hypotension: Yes  Has patient had a PCN reaction causing severe rash involving mucus membranes or skin necrosis: Yes Has patient had a PCN reaction that required hospitalization No Has patient had a PCN reaction occurring within the last 10 years: NO If all of the above answers are "NO", then may proceed with Cephalosporin use.    Sulfamethoxazole Rash   Meperidine Nausea And Vomiting   Morphine And Codeine Nausea And Vomiting   Propoxyphene     Other reaction(s): Unknown   Tape Other (See Comments)    "THE PLASTIC, CLEAR TAPE CAN/DOES PULL OFF MY SKIN."   Wheat Other (See Comments)    Runny nose   Doxycycline Nausea Only, Rash and Other (See Comments)    Made her "feel terrible"   Lanolin Itching and Rash   Sulfa Antibiotics Rash    Social History:   Social History   Socioeconomic History   Marital status: Divorced    Spouse name: Not on file   Number of children: Not on file   Years of education: Not on file   Highest education level: Not on file  Occupational History   Not on file  Tobacco Use   Smoking status: Never   Smokeless tobacco: Never  Vaping Use   Vaping status: Never Used  Substance and Sexual Activity   Alcohol use: No    Alcohol/week: 0.0 standard drinks of alcohol   Drug use: No   Sexual activity: Not on file  Other Topics Concern   Not on file  Social History Narrative   Not on file   Social Drivers of Health   Financial Resource Strain: Low Risk  (10/28/2022)   Received from [facility]   Overall Financial Resource Strain  (CARDIA)    Difficulty of Paying Living Expenses: Not hard at all  Food Insecurity: No Food Insecurity (07/17/2023)   Hunger Vital Sign    Worried About Running Out of Food in the Last Year: Never true    Ran  Out of Food in the Last Year: Never true  Transportation Needs: No Transportation Needs (07/17/2023)   PRAPARE - Administrator, Civil Service (Medical): No    Lack of Transportation (Non-Medical): No  Physical Activity: Not on file  Stress: Not on file  Social Connections: Unknown (04/27/2022)   Received from [facility]   Social Network    Social Network: Not on file  Intimate Partner Violence: Not At Risk (07/17/2023)   Humiliation, Afraid, Rape, and Kick questionnaire    Fear of Current or Ex-Partner: No    Emotionally Abused: No    Physically Abused: No    Sexually Abused: No    Family History:    Family History  Problem Relation Age of Onset   Coronary artery disease Mother    Thyroid disease Mother    Multiple myeloma Father    Heart attack Maternal Grandfather    Heart attack Paternal Grandfather      ROS:  Please see the history of present illness.   All other ROS reviewed and negative.     Physical Exam/Data:   Vitals:   08/09/23 0930 08/09/23 1100 08/09/23 1126 08/09/23 1215  BP: (!) 108/51 103/75  (!) 120/49  Pulse: 63 71  62  Resp: 18 17  17   Temp:   98.1 F (36.7 C)   TempSrc:      SpO2: 100% 100%  100%   No intake or output data in the 24 hours ending 08/09/23 1341    07/27/2023    8:19 AM 07/21/2023    4:03 AM 07/20/2023    3:49 AM  Last 3 Weights  Weight (lbs) 206 lb 3.2 oz 209 lb 7 oz 204 lb 9.4 oz  Weight (kg) 93.532 kg 95 kg 92.8 kg     There is no height or weight on file to calculate BMI.  General:  Obese older female, sitting up in bed. Wearing [place] @2L  HEENT: normal Neck: difficult to assess JVD Vascular: No carotid bruits; Distal pulses 2+ bilaterally Cardiac:  normal S1, S2; RRR; no murmur  Lungs:   Diminished in bases bilaterally Abd: soft, nontender, no hepatomegaly  Ext: chronic LE lymphedema  Musculoskeletal:  No deformities, BUE and BLE strength normal and equal Skin: warm and dry  Neuro:  CNs 2-12 intact, no focal abnormalities noted Psych:  Normal affect   EKG:  The EKG was personally reviewed and demonstrates:  Sinus rhythm, Vpaced Telemetry:  Telemetry was personally reviewed and demonstrates:  Sinus Rhythm  Relevant CV Studies:  Echo: 12/30  IMPRESSIONS     1. Left ventricular ejection fraction, by estimation, is 55 to 60%. The  left ventricle has normal function. The left ventricle has no regional  wall motion abnormalities. Left ventricular diastolic parameters are  consistent with Grade I diastolic  dysfunction (impaired relaxation).   2. Right ventricular systolic function is normal. The right ventricular  size is normal.   3. Large pleural effusion.   4. The mitral valve is normal in structure. No evidence of mitral valve  regurgitation. No evidence of mitral stenosis.   5. The aortic valve is normal in structure. Aortic valve regurgitation is  not visualized. No aortic stenosis is present.   6. The inferior vena cava is dilated in size with <50% respiratory  variability, suggesting right atrial pressure of 15 mmHg.   FINDINGS   Left Ventricle: Left ventricular ejection fraction, by estimation, is 55  to 60%. The left ventricle  has normal function. The left ventricle has no  regional wall motion abnormalities. The left ventricular internal cavity  size was normal in size. There is   no left ventricular hypertrophy. Left ventricular diastolic parameters  are consistent with Grade I diastolic dysfunction (impaired relaxation).  Indeterminate filling pressures.   Right Ventricle: The right ventricular size is normal. No increase in  right ventricular wall thickness. Right ventricular systolic function is  normal.   Left Atrium: Left atrial size was  normal in size.   Right Atrium: Right atrial size was normal in size.   Pericardium: There is no evidence of pericardial effusion.   Mitral Valve: The mitral valve is normal in structure. No evidence of  mitral valve regurgitation. No evidence of mitral valve stenosis.   Tricuspid Valve: The tricuspid valve is normal in structure. Tricuspid  valve regurgitation is not demonstrated. No evidence of tricuspid  stenosis.   Aortic Valve: The aortic valve is normal in structure. Aortic valve  regurgitation is not visualized. No aortic stenosis is present.   Pulmonic Valve: The pulmonic valve was normal in structure. Pulmonic valve  regurgitation is not visualized. No evidence of pulmonic stenosis.   Aorta: The aortic root is normal in size and structure.   Venous: The inferior vena cava is dilated in size with less than 50%  respiratory variability, suggesting right atrial pressure of 15 mmHg.   IAS/Shunts: No atrial level shunt detected by color flow Doppler.   Additional Comments: There is a large pleural effusion.    Laboratory Data:  High Sensitivity Troponin:   Recent Labs  Lab 07/17/23 1210 07/17/23 1546 08/08/23 1645 08/08/23 1927 08/09/23 0427  TROPONINIHS 26* 53* 39* 46* 53*     Chemistry Recent Labs  Lab 08/08/23 1645 08/08/23 1708 08/08/23 1709 08/08/23 1927 08/09/23 0427  NA 135 139 138  --  139  K 4.3 4.4 4.4  --  4.3  CL 101 103  --   --  105  CO2 23  --   --   --  26  GLUCOSE 149* 145*  --   --  113*  BUN 22 24*  --   --  23  CREATININE 2.06* 2.10*  --   --  2.05*  CALCIUM 8.4*  --   --   --  8.0*  MG  --   --   --  2.0 1.8  GFRNONAA 24*  --   --   --  24*  ANIONGAP 11  --   --   --  8    Recent Labs  Lab 08/08/23 1645 08/09/23 0427  PROT 6.8 5.3*  ALBUMIN 3.3* 2.6*  AST 310* 318*  ALT 194* 204*  ALKPHOS 87 66  BILITOT 1.4* 1.2*   Lipids No results for input(s): "CHOL", "TRIG", "HDL", "LABVLDL", "LDLCALC", "CHOLHDL" in the last 168  hours.  Hematology Recent Labs  Lab 08/08/23 1645 08/08/23 1708 08/08/23 1709 08/09/23 0427  WBC 8.6  --   --  6.9  RBC 3.07*  --   --  2.56*  HGB 9.8* 10.9* 11.2* 8.1*  HCT 32.1* 32.0* 33.0* 25.8*  MCV 104.6*  --   --  100.8*  MCH 31.9  --   --  31.6  MCHC 30.5  --   --  31.4  RDW 16.3*  --   --  15.8*  PLT 223  --   --  158   Thyroid  Recent Labs  Lab 08/08/23 1927  TSH 2.287    BNP Recent Labs  Lab 08/08/23 1645  BNP 804.7*    DDimer No results for input(s): "DDIMER" in the last 168 hours.   Radiology/Studies:  DG Chest Portable 1 View Result Date: 08/08/2023 CLINICAL DATA:  Shortness of breath.  Status post thoracentesis. EXAM: PORTABLE CHEST 1 VIEW COMPARISON:  Chest radiograph dated 08/08/2023 at 5:34 p.m. FINDINGS: Lines/tubes: Left chest wall ICD leads project over the right atrium and ventricle and tributary of the coronary sinus. Lungs: Low lung volumes. Bilateral interstitial opacities. Bibasilar patchy opacities. Pleura: Small right and decreased small left pleural effusions. No pneumothorax. Heart/mediastinum: Similar enlarged cardiomediastinal silhouette. Bones: No acute osseous abnormality. IMPRESSION: 1. Small right and decreased small left pleural effusions. No pneumothorax. 2. Bilateral interstitial opacities and bibasilar patchy opacities, likely pulmonary edema. Electronically Signed   By: Agustin Cree M.D.   On: 08/08/2023 21:12   CT CHEST ABDOMEN PELVIS WO CONTRAST Result Date: 08/08/2023 CLINICAL DATA:  Sepsis.  Recent fall. EXAM: CT CHEST, ABDOMEN AND PELVIS WITHOUT CONTRAST TECHNIQUE: Multidetector CT imaging of the chest, abdomen and pelvis was performed following the standard protocol without IV contrast. RADIATION DOSE REDUCTION: This exam was performed according to the departmental dose-optimization program which includes automated exposure control, adjustment of the mA and/or kV according to patient size and/or use of iterative reconstruction  technique. COMPARISON:  Radiograph earlier today. 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 Aortic atherosclerosis without aneurysm. The heart is mildly enlarged. No pericardial effusion. Mediastinum/Nodes: Lack of contrast limits hilar assessment. No gross mediastinal adenopathy. No mediastinal hemorrhage. Decompressed esophagus, no pneumomediastinum. Subcentimeter thyroid nodules. Not clinically significant; no follow-up imaging recommended (ref: J Am Coll Radiol. 2015 Feb;12(2): 143-50). Lungs/Pleura: Large left and moderate right pleural effusion, simple fluid density. Complete collapse/compressive atelectasis of the left lower lobe with subtotal atelectasis of the anterior left upper lobe. Only small portion of left upper lobe is aerated in the hemithorax. No evidence of endobronchial or tracheal debris or obstructing lesion. Compressive atelectasis in the dependent right lung. Perifissural atelectasis in the right upper and middle lobes. No evidence of pulmonary mass. Musculoskeletal: There are no acute or suspicious osseous abnormalities. Age related degenerative change in the spine. Bilateral breast implants which are partially calcified. CT ABDOMEN PELVIS FINDINGS Hepatobiliary: Subtle capsular nodularity of the liver suspicious for cirrhosis. 8 mm hypodensity in the subcapsular right lobe of the liver is unchanged from prior exam and likely cyst. Layering sludge or stones in the gallbladder. No pericholecystic inflammation. No biliary dilatation. No evidence of hepatic injury. Pancreas: Unremarkable unenhanced appearance. Spleen: Unremarkable unenhanced appearance. 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 No further follow-up imaging is recommended. Normal right adrenal gland. No evidence of adrenal hemorrhage or injury. Bilateral renal parenchymal thinning without hydronephrosis or focal renal abnormality  on this unenhanced exam. Stomach/Bowel: Bowel assessment is limited in the absence of contrast, arms down positioning and motion. The stomach is decompressed. No evidence of bowel obstruction or inflammation. Small volume of stool in the colon. Vascular/Lymphatic: Advanced aortic and branch atherosclerosis. No aortic aneurysm. No retroperitoneal fluid or perivascular haziness to suggest injury. Scattered small retroperitoneal and periportal nodes are typically reactive. No suspicious adenopathy. Reproductive: Status post hysterectomy. No adnexal masses. Other: Small volume simple ascites in the abdomen and pelvis. No evidence of hemorrhage. No free air. Mild generalized body wall edema. Musculoskeletal: There are no acute or suspicious osseous abnormalities. Moderate degenerative change in the  lumbar spine. IMPRESSION: 1. Large left pleural effusion with subtotal collapse/atelectasis of the left lung. Only small portion of the left upper lobe is aerated. Moderate-sized right pleural effusion with atelectasis throughout the right lung. 2. Small volume simple ascites in the abdomen and pelvis. Mild generalized body wall edema. 3. Subtle capsular nodularity of the liver suspicious for cirrhosis. 4. Cholelithiasis without evidence of cholecystitis. Aortic Atherosclerosis (ICD10-I70.0). Electronically Signed   By: Narda Rutherford M.D.   On: 08/08/2023 19:24   CT HEAD WO CONTRAST ( ) Result Date: 08/08/2023 CLINICAL DATA:  Trauma EXAM: CT HEAD WITHOUT CONTRAST CT MAXILLOFACIAL WITHOUT CONTRAST CT CERVICAL SPINE WITHOUT CONTRAST TECHNIQUE: Multidetector CT imaging of the head, cervical spine, and maxillofacial structures were performed using the standard protocol without intravenous contrast. Multiplanar CT image reconstructions of the cervical spine and maxillofacial structures were also generated. RADIATION DOSE REDUCTION: This exam was performed according to the departmental dose-optimization program which  includes automated exposure control, adjustment of the mA and/or kV according to patient size and/or use of iterative reconstruction technique. COMPARISON:  05/01/2020 FINDINGS: CT HEAD FINDINGS Brain: There is no mass, hemorrhage or extra-axial collection. The size and configuration of the ventricles and extra-axial CSF spaces are normal. There is hypoattenuation of the white matter, most commonly indicating chronic small vessel disease. Vascular: Atherosclerotic calcification of the internal carotid arteries at the skull base. No abnormal hyperdensity of the major intracranial arteries or dural venous sinuses. Skull: The visualized skull base, calvarium and extracranial soft tissues are normal. Sinuses/Orbits: No fluid levels or advanced mucosal thickening of the visualized paranasal sinuses. No mastoid or middle ear effusion. Normal orbits. Other: None. CT MAXILLOFACIAL FINDINGS Osseous: No facial fracture or mandibular dislocation. Orbits: The globes are intact. Normal appearance of the intra- and extraconal fat. Symmetric extraocular muscles and optic nerves. Sinuses: No fluid levels or advanced mucosal thickening. Soft tissues: Normal visualized extracranial soft tissues. CT CERVICAL SPINE FINDINGS Alignment: No static subluxation. Facets are aligned. Occipital condyles and the lateral masses of C1-C2 are aligned. Skull base and vertebrae: No acute fracture. Soft tissues and spinal canal: No prevertebral fluid or swelling. No visible canal hematoma. Disc levels: No advanced spinal canal or neural foraminal stenosis. Upper chest: Fluid filling the left hemithorax. Small right pleural effusion, incompletely visualized. Other: 1.8 cm left parotid mass. IMPRESSION: 1. No acute intracranial abnormality. 2. No facial fracture. 3. No acute fracture or static subluxation of the cervical spine. 4. Fluid filling the left hemithorax. Small right pleural effusion, incompletely visualized. 5. 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  Due to the overlap of the imaging features of benign and malignant parotid masses, histologic sampling is recommended. Electronically Signed   By: Deatra Robinson M.D.   On: 08/08/2023 19:19   CT Maxillofacial Wo Contrast Result Date: 08/08/2023 CLINICAL DATA:  Trauma EXAM: CT HEAD WITHOUT CONTRAST CT MAXILLOFACIAL WITHOUT CONTRAST CT CERVICAL SPINE WITHOUT CONTRAST TECHNIQUE: Multidetector CT imaging of the head, cervical spine, and maxillofacial structures were performed using the standard protocol without intravenous contrast. Multiplanar CT image reconstructions of the cervical spine and maxillofacial structures were also generated. RADIATION DOSE REDUCTION: This exam was performed according to the departmental dose-optimization program which includes automated exposure control, adjustment of the mA and/or kV according to patient size and/or use of iterative reconstruction technique. COMPARISON:  05/01/2020 FINDINGS: CT HEAD FINDINGS Brain: There is no mass, hemorrhage or extra-axial collection. The size and configuration of the ventricles and extra-axial CSF spaces are normal. There is hypoattenuation of  the white matter, most commonly indicating chronic small vessel disease. Vascular: Atherosclerotic calcification of the internal carotid arteries at the skull base. No abnormal hyperdensity of the major intracranial arteries or dural venous sinuses. Skull: The visualized skull base, calvarium and extracranial soft tissues are normal. Sinuses/Orbits: No fluid levels or advanced mucosal thickening of the visualized paranasal sinuses. No mastoid or middle ear effusion. Normal orbits. Other: None. CT MAXILLOFACIAL FINDINGS Osseous: No facial fracture or mandibular dislocation. Orbits: The globes are intact. Normal appearance of the intra- and extraconal fat. Symmetric extraocular muscles and optic nerves. Sinuses: No fluid levels or advanced mucosal thickening. Soft tissues: Normal visualized extracranial soft  tissues. CT CERVICAL SPINE FINDINGS Alignment: No static subluxation. Facets are aligned. Occipital condyles and the lateral masses of C1-C2 are aligned. Skull base and vertebrae: No acute fracture. Soft tissues and spinal canal: No prevertebral fluid or swelling. No visible canal hematoma. Disc levels: No advanced spinal canal or neural foraminal stenosis. Upper chest: Fluid filling the left hemithorax. Small right pleural effusion, incompletely visualized. Other: 1.8 cm left parotid mass. IMPRESSION: 1. No acute intracranial abnormality. 2. No facial fracture. 3. No acute fracture or static subluxation of the cervical spine. 4. Fluid filling the left hemithorax. Small right pleural effusion, incompletely visualized. 5. 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 Due to the overlap of the imaging features of benign and malignant parotid masses, histologic sampling is recommended. Electronically Signed   By: Deatra Robinson M.D.   On: 08/08/2023 19:19   CT Cervical Spine Wo Contrast Result Date: 08/08/2023 CLINICAL DATA:  Trauma EXAM: CT HEAD WITHOUT CONTRAST CT MAXILLOFACIAL WITHOUT CONTRAST CT CERVICAL SPINE WITHOUT CONTRAST TECHNIQUE: Multidetector CT imaging of the head, cervical spine, and maxillofacial structures were performed using the standard protocol without intravenous contrast. Multiplanar CT image reconstructions of the cervical spine and maxillofacial structures were also generated. RADIATION DOSE REDUCTION: This exam was performed according to the departmental dose-optimization program which includes automated exposure control, adjustment of the mA and/or kV according to patient size and/or use of iterative reconstruction technique. COMPARISON:  05/01/2020 FINDINGS: CT HEAD FINDINGS Brain: There is no mass, hemorrhage or extra-axial collection. The size and configuration of the ventricles and extra-axial CSF spaces are normal. There is hypoattenuation of the white matter, most commonly indicating chronic small  vessel disease. Vascular: Atherosclerotic calcification of the internal carotid arteries at the skull base. No abnormal hyperdensity of the major intracranial arteries or dural venous sinuses. Skull: The visualized skull base, calvarium and extracranial soft tissues are normal. Sinuses/Orbits: No fluid levels or advanced mucosal thickening of the visualized paranasal sinuses. No mastoid or middle ear effusion. Normal orbits. Other: None. CT MAXILLOFACIAL FINDINGS Osseous: No facial fracture or mandibular dislocation. Orbits: The globes are intact. Normal appearance of the intra- and extraconal fat. Symmetric extraocular muscles and optic nerves. Sinuses: No fluid levels or advanced mucosal thickening. Soft tissues: Normal visualized extracranial soft tissues. CT CERVICAL SPINE FINDINGS Alignment: No static subluxation. Facets are aligned. Occipital condyles and the lateral masses of C1-C2 are aligned. Skull base and vertebrae: No acute fracture. Soft tissues and spinal canal: No prevertebral fluid or swelling. No visible canal hematoma. Disc levels: No advanced spinal canal or neural foraminal stenosis. Upper chest: Fluid filling the left hemithorax. Small right pleural effusion, incompletely visualized. Other: 1.8 cm left parotid mass. IMPRESSION: 1. No acute intracranial abnormality. 2. No facial fracture. 3. No acute fracture or static subluxation of the cervical spine. 4. Fluid filling the left hemithorax. Small  right pleural effusion, incompletely visualized. 5. 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 Due to the overlap of the imaging features of benign and malignant parotid masses, histologic sampling is recommended. Electronically Signed   By: Deatra Robinson M.D.   On: 08/08/2023 19:19   DG Hand 2 View Left Result Date: 08/08/2023 CLINICAL DATA:  Recent fall with left hand pain, initial encounter EXAM: LEFT HAND - 2 VIEW COMPARISON:  None Available. FINDINGS: There is no evidence of fracture or dislocation. There is  no evidence of arthropathy or other focal bone abnormality. Soft tissues are unremarkable. IMPRESSION: No acute abnormality noted. Electronically Signed   By: Alcide Clever M.D.   On: 08/08/2023 18:12   DG Hand 2 View Right Result Date: 08/08/2023 CLINICAL DATA:  Recent fall with right hand pain, initial encounter EXAM: RIGHT HAND - 2 VIEW COMPARISON:  None Available. FINDINGS: There is no evidence of fracture or dislocation. There is no evidence of arthropathy or other focal bone abnormality. Soft tissues are unremarkable. IMPRESSION: No acute abnormality noted. Electronically Signed   By: Alcide Clever M.D.   On: 08/08/2023 18:12   DG Chest Port 1 View Result Date: 08/08/2023 CLINICAL DATA:  Recent fall with chest pain, initial encounter EXAM: PORTABLE CHEST 1 VIEW COMPARISON:  07/20/2023 FINDINGS: Cardiac shadow is enlarged. Defibrillator is again noted and stable. Aortic calcifications are seen. Patient rotation to the right is noted accentuating the mediastinal markings. The lungs are well aerated. Small left pleural effusion remains. No new focal abnormality is noted. IMPRESSION: No significant change from the prior exam. Electronically Signed   By: Alcide Clever M.D.   On: 08/08/2023 18:04     Assessment and Plan:   Lisa [name] is a 82 y.o. female with a hx of HTN, CAD s/p DES to LAD '17, NICM s/p CRT-D, CML, LBBB, chronic anemia who is being seen 08/09/2023 for the evaluation of CHF at the request of Dr. Jomarie Longs.  Acute on Chronic HFpEF Recurrent left sided pleural effusion Acute hypoxic respiratory failure -- presented with vomiting of the past 5 days along with diarrhea and mechanical fall -- CT of head, neck, spine showed large left sided pleural effusion. BNP 804 -- s/p thoracentesis, 1.1L removed. Fluid appeared largely transudative, pleural fluid LDH was elevated, serum LDH pending -- has been placed on IV lasix 40mg  BID, 500cc UOP noted in canister thus far. Suppose to be on  torsemide 20mg  daily but takes every other day at best. Follow response -- UA negative for protein -- GDMT: limited in the setting of renal function, suspect she may not be a good candidate for SGLT2 given body habitus   Hx of VT S/p CRT-D -- followed by Dr. Ladona Ridgel, stable device function  -- on amiodarone   CKD stage 3b -- baseline Cr around 1.6, up to 2.06 on admission -- follow closely with diuresis   Elevated LFTs -- AST 310>>318, ALT 194>>204. No signs of shock on exam, warm and dry.  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  Agree as above, with the following exceptions and changes as noted below.  Patient noted to have reaccumulation of pleural effusion though she states that she felt last thoracentesis had residual fluid, intended to take scheduled diuresis at home but per primary team notes with intermittent compliance, and above-mentioned comorbid history who presents with decompensated heart failure. Gen: NAD, CV: RRR, no murmurs, Lungs: clear, Abd: soft, Extrem: Significant lymphedema bilaterally, Neuro/Psych: alert and oriented x 3, normal mood and affect. All available labs, radiology testing, previous records reviewed. Patient has been placed on Lasix 40 mg IV twice daily, if only minimal response, would increase the dose to Lasix 80 mg IV twice daily given current  creatinine of 2.  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  May want to consider outpatient GI review, may benefit from spironolactone once renal function recovers.  Urinalysis reviewed no proteinuria seen.  Remainder as above.  Parke Poisson, MD 08/09/23 5:33 PM

## 2023-08-09 NOTE — ED Notes (Signed)
Echo at bedside

## 2023-08-09 NOTE — ED Notes (Signed)
Phlebotomy to collect outstanding labs.

## 2023-08-09 NOTE — ED Notes (Addendum)
Per [name] (pharmacy) Gleevac 400mg  not available in our hospital.  Must be brought from home.

## 2023-08-09 NOTE — Evaluation (Signed)
Occupational Therapy Evaluation Patient Details Name: Lisa [name] MRN: [id number] DOB: 11-06-1940 Today's Date: 08/09/2023   History of Present Illness Pt is an 82 year old woman admitted 10/08/2022 with suspected acute on chronic diastolic heart failure after presenting from home complaining of shortness of breath. PMH includes: + for pseudomonas, CHF exacerbation, L pleural effusion. 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   Clinical Impression   Patient admitted for the diagnosis above.  PTA she lives at home, and states [facility] in the home, [place] in the community, continues to drive, manages light meal prep and ADL.  Has a cleaning person 1x/wk.   Patient with recent hospitalization with discharge home with no follow up rehab.  Currently needing Mod A for lower body ADL, Min A for upper body ADL, and up to Mod A for simple transfers at [facility].  OT wil continue efforts in the acute setting to address deficits.  Hopefully the patient will do better in a regular room versus ED, Patient will benefit from continued inpatient follow up therapy, <3 hours/day.  Patient would prefer to return home.          If plan is discharge home, recommend the following: A little help with walking and/or transfers;A little help with bathing/dressing/bathroom;Assistance with cooking/housework    Functional Status Assessment  Patient has had a recent decline in their functional status and demonstrates the ability to make significant improvements in function in a reasonable and predictable amount of time.  Equipment Recommendations  None recommended by OT    Recommendations for Other Services       Precautions / Restrictions Precautions Precautions: Fall Precaution Comments: watch O2, on chemo meds Restrictions Weight Bearing Restrictions Per Provider Order: No      Mobility Bed Mobility Overal bed mobility: Needs Assistance Bed Mobility: Supine to Sit, Sit to Supine      Supine to sit: Min assist, HOB elevated Sit to supine: Mod assist     Patient Response: Cooperative  Transfers Overall transfer level: Needs assistance Equipment used: Rolling walker (2 wheels) Transfers: Sit to/from Stand Sit to Stand: Min assist           General transfer comment: couple side steps along ED stretcher, but having to lean on stretcher      Balance Overall balance assessment: Needs assistance Sitting-balance support: Feet unsupported, Bilateral upper extremity supported Sitting balance-Leahy Scale: Fair     Standing balance support: Reliant on assistive device for balance Standing balance-Leahy Scale: Poor                             ADL either performed or assessed with clinical judgement   ADL Overall ADL's : Needs assistance/impaired Eating/Feeding: Set up;Bed level   Grooming: Contact guard assist;Sitting   Upper Body Bathing: Minimal assistance;Sitting   Lower Body Bathing: Maximal assistance;Sit to/from stand   Upper Body Dressing : Minimal assistance;Moderate assistance;Sitting   Lower Body Dressing: Maximal assistance;Sit to/from stand   Toilet Transfer: Moderate assistance;Stand-pivot;BSC/3in1   Toileting- Clothing Manipulation and Hygiene: Maximal assistance;Sit to/from stand               Vision Baseline Vision/History: 1 Wears glasses Patient Visual Report: No change from baseline       Perception Perception: Within Functional Limits       Praxis Praxis: [facility]       Pertinent Vitals/Pain Pain Assessment Pain Assessment: Faces  Faces Pain Scale: No hurt Pain Intervention(s): Monitored during session     Extremity/Trunk Assessment Upper Extremity Assessment Upper Extremity Assessment: Generalized weakness   Lower Extremity Assessment Lower Extremity Assessment: Defer to PT evaluation   Cervical / Trunk Assessment Cervical / Trunk Assessment: Normal;Other exceptions Cervical / Trunk Exceptions:  chronic lymphedema, body habitus, chronic O2   Communication Communication Communication: No apparent difficulties   Cognition Arousal: Alert Behavior During Therapy: Flat affect Overall Cognitive Status: Within Functional Limits for tasks assessed                                       General Comments   VSS on O2    Exercises     Shoulder Instructions      Home Living Family/patient expects to be discharged to:: Private residence Living Arrangements: Alone Available Help at Discharge: Family;Available PRN/intermittently Type of Home: House Home Access: Stairs to enter [employer] of Steps: 3 Entrance Stairs-Rails: Right Home Layout: Two level;Able to live on main level with bedroom/bathroom     Bathroom Shower/Tub: Producer, television/film/video: Standard Bathroom Accessibility: Yes How Accessible: Accessible via walker Home Equipment: Rollator (4 wheels);Shower seat;Cane - single point          Prior Functioning/Environment Prior Level of Function : Independent/Modified Independent;Driving             Mobility Comments: Amb with SPC in house, rollator out of house ADLs Comments: Drives, ind, has shower chair with no back. Likes to read, cook, go to restaurants. Was managing her LE dressings.        OT Problem List: Decreased activity tolerance;Impaired balance (sitting and/or standing);Decreased strength;Increased edema      OT Treatment/Interventions: Self-care/ADL training;Energy conservation;DME and/or AE instruction;Patient/family education;Balance training;Therapeutic activities    OT Goals(Current goals can be found in the care plan section) Acute Rehab OT Goals Patient Stated Goal: Wants to return home OT Goal Formulation: With patient Time For Goal Achievement: 08/23/23 Potential to Achieve Goals: Good  OT Frequency: Min 1X/week    Co-evaluation              AM-PAC OT "6 Clicks" Daily Activity     Outcome  Measure Help from another person eating meals?: A Little Help from another person taking care of personal grooming?: A Little Help from another person toileting, which includes using toliet, bedpan, or urinal?: A Lot Help from another person bathing (including washing, rinsing, drying)?: A Lot Help from another person to put on and taking off regular upper body clothing?: A Little Help from another person to put on and taking off regular lower body clothing?: A Lot 6 Click Score: 15   End of Session Equipment Utilized During Treatment: Rolling walker (2 wheels) Nurse Communication: Mobility status  Activity Tolerance: Patient tolerated treatment well Patient left: in bed;with call bell/phone within reach  OT Visit Diagnosis: Unsteadiness on feet (R26.81);Other abnormalities of gait and mobility (R26.89);Other (comment)                Time: 4098-1191 OT Time Calculation (min): 21 min Charges:  OT General Charges $OT Visit: 1 Visit OT Evaluation $OT Eval Moderate Complexity: 1 Mod  08/09/2023  RP, OTR/L  Acute Rehabilitation Services  Office:  [phone]   Suzanna Obey 08/09/2023, 2:58 PM

## 2023-08-09 NOTE — Progress Notes (Signed)
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 -Back in the ED 12/29, reported 2 to 3 days of progressive dyspnea on exertion with cough orthopnea and nausea vomiting. -In the ED hypertensive, hypoxic, underwent left thoracentesis by EDP, 1.1 mL of pleural fluid drained, labs noted creatinine 2.0, sodium 135, mildly elevated LFTs, BNP 804, troponin 39, 46, WBC 8.6, hemoglobin 9.8.  CT chest abdomen pelvis demonstrated large left pleural effusion, moderate right pleural effusion, small ascites   Subjective: -Still weak and short of breath, breathing better after thoracentesis  Assessment and Plan:  Acute on chronic diastolic CHF Recurrent pleural effusions left> right -Recent echo 12/24 with EF 60-65%, normal RV, moderate pericardial effusion -Recent admission for same, supposed to be on torsemide 20 Mg daily with poor compliance,  takes this few times a week only "becoz it doesn't work much" -Recent L thoracentesis 12/10, 900 mL of transudative fluid drained, cytology negative -Repeat L thoracentesis in ED overnight, 1.1 L drained, largely appears transudative, pleural fluid LDH is elevated, await serum LDH -Continue Lasix 40 Mg twice daily, monitor urine output, may need higher dose, continue Toprol, GDMT limited by CKD  Acute hypoxic respiratory failure -Secondary to diastolic CHF, pleural effusions left> right -As above, wean O2  History of chronic nausea and vomiting -I suspect this is multifactorial, very common side effect of Gleevec, also suspect a posttussive component to her  vomiting -Supportive care  Mechanical fall, generalized weakness -Likely secondary to dyspnea and hypoxia -Imaging unremarkable for injuries, PT OT consult  AKI on CKD 3B,  -baseline creatinine around 1.6-2 -Relatively stable, monitor with diuresis  Abnormal LFTs -Could be related to passive hepatic congestion, Gleevac  History of ventricular tachycardia On amiodarone -Check mag  History of CML Acute on chronic anemia -Maintained on Gleevec,  -Check anemia panel  Hypothyroidism Continue Synthroid  DVT prophylaxis: add lovenox Code Status: Full Code Family Communication: None present Disposition Plan: TBD  Consultants:    Procedures:   Antimicrobials:    Objective: Vitals:   08/09/23 0600 08/09/23 0731 08/09/23 0900 08/09/23 0930  BP: (!) 104/50 (!) 100/51 (!) 109/49 (!) 108/51  Pulse: (!) 57 63 64 63  Resp: 18 18 20 18   Temp:  97.9 F (36.6 C)    TempSrc:  Oral    SpO2: 100% 100% 100% 100%   No intake or output data in the 24 hours ending 08/09/23 1120 There were no vitals filed for this visit.  Examination:  General exam: Obese chronically ill female sitting up in bed, AAO x 3 HEENT: Neck obese unable to assess JVD CVS: S1-S2, regular rhythm Lungs: Bilateral lower lung Rales Abdomen: Soft, obese, nontender, bowel sounds present Extremities: 2+ edema, chronic skin changes and deformities Psychiatry:  Mood & affect appropriate.     Data Reviewed:   CBC: Recent Labs  Lab 08/08/23 1645 08/08/23 1708 08/08/23 1709 08/09/23 0427  WBC 8.6  --   --  6.9  NEUTROABS 5.8  --   --  5.5  HGB 9.8* 10.9* 11.2* 8.1*  HCT 32.1* 32.0* 33.0* 25.8*  MCV 104.6*  --   --  100.8*  PLT 223  --   --  158   Basic Metabolic Panel: Recent Labs  Lab 08/08/23 1645 08/08/23 1708 08/08/23 1709 08/08/23 1927 08/09/23 0427  NA 135 139 138  --  139  K 4.3 4.4 4.4  --  4.3  CL 101 103  --   --  105  CO2 23  --   --   --  26  GLUCOSE 149* 145*  --   --  113*   BUN 22 24*  --   --  23  CREATININE 2.06* 2.10*  --   --  2.05*  CALCIUM 8.4*  --   --   --  8.0*  MG  --   --   --  2.0 1.8  PHOS  --   --   --   --  3.8   GFR: Estimated Creatinine Clearance: 22.9 mL/min (A) (by C-G formula based on SCr of 2.05 mg/dL (H)). Liver Function Tests: Recent Labs  Lab 08/08/23 1645 08/09/23 0427  AST 310* 318*  ALT 194* 204*  ALKPHOS 87 66  BILITOT 1.4* 1.2*  PROT 6.8 5.3*  ALBUMIN 3.3* 2.6*   No results for input(s): "LIPASE", "AMYLASE" in the last 168 hours. No results for input(s): "AMMONIA" in the last 168 hours. Coagulation Profile: Recent Labs  Lab 08/09/23 0427  INR 1.5*   Cardiac Enzymes: No results for input(s): "CKTOTAL", "CKMB", "CKMBINDEX", "TROPONINI" in the last 168 hours. BNP (last 3 results) No results for input(s): "PROBNP" in the last 8760 hours. HbA1C: No results for input(s): "HGBA1C" in the last 72 hours. CBG: No results for input(s): "GLUCAP" in the last 168 hours. Lipid Profile: No results for input(s): "CHOL", "HDL", "LDLCALC", "TRIG", "CHOLHDL", "LDLDIRECT" in the last 72 hours. Thyroid Function Tests: Recent Labs    08/08/23 1927  TSH 2.287   Anemia Panel: Recent Labs    08/09/23 0427  VITAMINB12 1,167*   Urine analysis:    Component Value Date/Time   COLORURINE YELLOW 08/09/2023 0742   APPEARANCEUR CLEAR 08/09/2023 0742   LABSPEC 1.009 08/09/2023 0742   PHURINE 5.0 08/09/2023 0742   GLUCOSEU NEGATIVE 08/09/2023 0742   HGBUR NEGATIVE 08/09/2023 0742   BILIRUBINUR NEGATIVE 08/09/2023 0742   BILIRUBINUR negative 06/04/2016 0847   BILIRUBINUR neg 02/14/2015 1431   KETONESUR NEGATIVE 08/09/2023 0742   PROTEINUR NEGATIVE 08/09/2023 0742   UROBILINOGEN 0.2 06/04/2016 0847   NITRITE NEGATIVE 08/09/2023 0742   LEUKOCYTESUR TRACE (A) 08/09/2023 0742   Sepsis Labs: @LABRCNTIP (procalcitonin:4,lacticidven:4)  ) Recent Results (from the past 240 hours)  Body fluid culture w Gram Stain     Status: None  (Preliminary result)   Collection Time: 08/08/23  9:34 PM   Specimen: Pleural Fluid  Result Value Ref Range Status   Specimen Description FLUID PLEURAL  Final   Special Requests NONE  Final   Gram Stain NO WBC SEEN NO ORGANISMS SEEN   Final   Culture   Final    NO GROWTH < 12 HOURS Performed at [facility], [address] [place], [place] [address]    Report Status PENDING  Incomplete     Radiology Studies: DG Chest Portable 1 View Result Date: 08/08/2023 CLINICAL DATA:  Shortness of breath.  Status post thoracentesis. EXAM: PORTABLE CHEST 1 VIEW COMPARISON:  Chest radiograph dated 08/08/2023 at 5:34 p.m. FINDINGS: Lines/tubes: Left chest wall ICD leads project over the right atrium and ventricle and tributary of the coronary sinus. Lungs: Low lung volumes. Bilateral interstitial opacities. Bibasilar patchy opacities. Pleura: Small right  and decreased small left pleural effusions. No pneumothorax. Heart/mediastinum: Similar enlarged cardiomediastinal silhouette. Bones: No acute osseous abnormality. IMPRESSION: 1. Small right and decreased small left pleural effusions. No pneumothorax. 2. Bilateral interstitial opacities and bibasilar patchy opacities, likely pulmonary edema. Electronically Signed   By: Agustin Cree M.D.   On: 08/08/2023 21:12   CT CHEST ABDOMEN PELVIS WO CONTRAST Result Date: 08/08/2023 CLINICAL DATA:  Sepsis.  Recent fall. EXAM: CT CHEST, ABDOMEN AND PELVIS WITHOUT CONTRAST TECHNIQUE: Multidetector CT imaging of the chest, abdomen and pelvis was performed following the standard protocol without IV contrast. RADIATION DOSE REDUCTION: This exam was performed according to the departmental dose-optimization program which includes automated exposure control, adjustment of the mA and/or kV according to patient size and/or use of iterative reconstruction technique. COMPARISON:  Radiograph earlier today. 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 Aortic atherosclerosis without aneurysm. The heart is mildly enlarged. No pericardial effusion. Mediastinum/Nodes: Lack of contrast limits hilar assessment. No gross mediastinal adenopathy. No mediastinal hemorrhage. Decompressed esophagus, no pneumomediastinum. Subcentimeter thyroid nodules. Not clinically significant; no follow-up imaging recommended (ref: J Am Coll Radiol. 2015 Feb;12(2): 143-50). Lungs/Pleura: Large left and moderate right pleural effusion, simple fluid density. Complete collapse/compressive atelectasis of the left lower lobe with subtotal atelectasis of the anterior left upper lobe. Only small portion of left upper lobe is aerated in the hemithorax. No evidence of endobronchial or tracheal debris or obstructing lesion. Compressive atelectasis in the dependent right lung. Perifissural atelectasis in the right upper and middle lobes. No evidence of pulmonary mass. Musculoskeletal: There are no acute or suspicious osseous abnormalities. Age related degenerative change in the spine. Bilateral breast implants which are partially calcified. CT ABDOMEN PELVIS FINDINGS Hepatobiliary: Subtle capsular nodularity of the liver suspicious for cirrhosis. 8 mm hypodensity in the subcapsular right lobe of the liver is unchanged from prior exam and likely cyst. Layering sludge or stones in the gallbladder. No pericholecystic inflammation. No biliary dilatation. No evidence of hepatic injury. Pancreas: Unremarkable unenhanced appearance. Spleen: Unremarkable unenhanced appearance. 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 No further follow-up imaging is recommended. Normal right adrenal gland. No evidence of adrenal hemorrhage or injury. Bilateral renal parenchymal thinning without hydronephrosis or focal renal abnormality on this unenhanced exam. Stomach/Bowel: Bowel assessment is limited in the absence of contrast, arms down  positioning and motion. The stomach is decompressed. No evidence of bowel obstruction or inflammation. Small volume of stool in the colon. Vascular/Lymphatic: Advanced aortic and branch atherosclerosis. No aortic aneurysm. No retroperitoneal fluid or perivascular haziness to suggest injury. Scattered small retroperitoneal and periportal nodes are typically reactive. No suspicious adenopathy. Reproductive: Status post hysterectomy. No adnexal masses. Other: Small volume simple ascites in the abdomen and pelvis. No evidence of hemorrhage. No free air. Mild generalized body wall edema. Musculoskeletal: There are no acute or suspicious osseous abnormalities. Moderate degenerative change in the lumbar spine. IMPRESSION: 1. Large left pleural effusion with subtotal collapse/atelectasis of the left lung. Only small portion of the left upper lobe is aerated. Moderate-sized right pleural effusion with atelectasis throughout the right lung. 2. Small volume simple ascites in the abdomen and pelvis. Mild generalized body wall edema. 3. Subtle capsular nodularity of the liver suspicious for cirrhosis. 4. Cholelithiasis without evidence of cholecystitis. Aortic Atherosclerosis (ICD10-I70.0). Electronically Signed   By: Narda Rutherford M.D.   On: 08/08/2023 19:24   CT HEAD WO CONTRAST ( ) Result Date: 08/08/2023 CLINICAL DATA:  Trauma EXAM:  CT HEAD WITHOUT CONTRAST CT MAXILLOFACIAL WITHOUT CONTRAST CT CERVICAL SPINE WITHOUT CONTRAST TECHNIQUE: Multidetector CT imaging of the head, cervical spine, and maxillofacial structures were performed using the standard protocol without intravenous contrast. Multiplanar CT image reconstructions of the cervical spine and maxillofacial structures were also generated. RADIATION DOSE REDUCTION: This exam was performed according to the departmental dose-optimization program which includes automated exposure control, adjustment of the mA and/or kV according to patient size and/or use of  iterative reconstruction technique. COMPARISON:  05/01/2020 FINDINGS: CT HEAD FINDINGS Brain: There is no mass, hemorrhage or extra-axial collection. The size and configuration of the ventricles and extra-axial CSF spaces are normal. There is hypoattenuation of the white matter, most commonly indicating chronic small vessel disease. Vascular: Atherosclerotic calcification of the internal carotid arteries at the skull base. No abnormal hyperdensity of the major intracranial arteries or dural venous sinuses. Skull: The visualized skull base, calvarium and extracranial soft tissues are normal. Sinuses/Orbits: No fluid levels or advanced mucosal thickening of the visualized paranasal sinuses. No mastoid or middle ear effusion. Normal orbits. Other: None. CT MAXILLOFACIAL FINDINGS Osseous: No facial fracture or mandibular dislocation. Orbits: The globes are intact. Normal appearance of the intra- and extraconal fat. Symmetric extraocular muscles and optic nerves. Sinuses: No fluid levels or advanced mucosal thickening. Soft tissues: Normal visualized extracranial soft tissues. CT CERVICAL SPINE FINDINGS Alignment: No static subluxation. Facets are aligned. Occipital condyles and the lateral masses of C1-C2 are aligned. Skull base and vertebrae: No acute fracture. Soft tissues and spinal canal: No prevertebral fluid or swelling. No visible canal hematoma. Disc levels: No advanced spinal canal or neural foraminal stenosis. Upper chest: Fluid filling the left hemithorax. Small right pleural effusion, incompletely visualized. Other: 1.8 cm left parotid mass. IMPRESSION: 1. No acute intracranial abnormality. 2. No facial fracture. 3. No acute fracture or static subluxation of the cervical spine. 4. Fluid filling the left hemithorax. Small right pleural effusion, incompletely visualized. 5. 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 Due to the overlap of the imaging features of benign and malignant parotid masses, histologic sampling is  recommended. Electronically Signed   By: Deatra Robinson M.D.   On: 08/08/2023 19:19   CT Maxillofacial Wo Contrast Result Date: 08/08/2023 CLINICAL DATA:  Trauma EXAM: CT HEAD WITHOUT CONTRAST CT MAXILLOFACIAL WITHOUT CONTRAST CT CERVICAL SPINE WITHOUT CONTRAST TECHNIQUE: Multidetector CT imaging of the head, cervical spine, and maxillofacial structures were performed using the standard protocol without intravenous contrast. Multiplanar CT image reconstructions of the cervical spine and maxillofacial structures were also generated. RADIATION DOSE REDUCTION: This exam was performed according to the departmental dose-optimization program which includes automated exposure control, adjustment of the mA and/or kV according to patient size and/or use of iterative reconstruction technique. COMPARISON:  05/01/2020 FINDINGS: CT HEAD FINDINGS Brain: There is no mass, hemorrhage or extra-axial collection. The size and configuration of the ventricles and extra-axial CSF spaces are normal. There is hypoattenuation of the white matter, most commonly indicating chronic small vessel disease. Vascular: Atherosclerotic calcification of the internal carotid arteries at the skull base. No abnormal hyperdensity of the major intracranial arteries or dural venous sinuses. Skull: The visualized skull base, calvarium and extracranial soft tissues are normal. Sinuses/Orbits: No fluid levels or advanced mucosal thickening of the visualized paranasal sinuses. No mastoid or middle ear effusion. Normal orbits. Other: None. CT MAXILLOFACIAL FINDINGS Osseous: No facial fracture or mandibular dislocation. Orbits: The globes are intact. Normal appearance of the intra- and extraconal fat. Symmetric extraocular muscles and optic nerves.  Sinuses: No fluid levels or advanced mucosal thickening. Soft tissues: Normal visualized extracranial soft tissues. CT CERVICAL SPINE FINDINGS Alignment: No static subluxation. Facets are aligned. Occipital condyles  and the lateral masses of C1-C2 are aligned. Skull base and vertebrae: No acute fracture. Soft tissues and spinal canal: No prevertebral fluid or swelling. No visible canal hematoma. Disc levels: No advanced spinal canal or neural foraminal stenosis. Upper chest: Fluid filling the left hemithorax. Small right pleural effusion, incompletely visualized. Other: 1.8 cm left parotid mass. IMPRESSION: 1. No acute intracranial abnormality. 2. No facial fracture. 3. No acute fracture or static subluxation of the cervical spine. 4. Fluid filling the left hemithorax. Small right pleural effusion, incompletely visualized. 5. 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 Due to the overlap of the imaging features of benign and malignant parotid masses, histologic sampling is recommended. Electronically Signed   By: Deatra Robinson M.D.   On: 08/08/2023 19:19   CT Cervical Spine Wo Contrast Result Date: 08/08/2023 CLINICAL DATA:  Trauma EXAM: CT HEAD WITHOUT CONTRAST CT MAXILLOFACIAL WITHOUT CONTRAST CT CERVICAL SPINE WITHOUT CONTRAST TECHNIQUE: Multidetector CT imaging of the head, cervical spine, and maxillofacial structures were performed using the standard protocol without intravenous contrast. Multiplanar CT image reconstructions of the cervical spine and maxillofacial structures were also generated. RADIATION DOSE REDUCTION: This exam was performed according to the departmental dose-optimization program which includes automated exposure control, adjustment of the mA and/or kV according to patient size and/or use of iterative reconstruction technique. COMPARISON:  05/01/2020 FINDINGS: CT HEAD FINDINGS Brain: There is no mass, hemorrhage or extra-axial collection. The size and configuration of the ventricles and extra-axial CSF spaces are normal. There is hypoattenuation of the white matter, most commonly indicating chronic small vessel disease. Vascular: Atherosclerotic calcification of the internal carotid arteries at the skull base. No  abnormal hyperdensity of the major intracranial arteries or dural venous sinuses. Skull: The visualized skull base, calvarium and extracranial soft tissues are normal. Sinuses/Orbits: No fluid levels or advanced mucosal thickening of the visualized paranasal sinuses. No mastoid or middle ear effusion. Normal orbits. Other: None. CT MAXILLOFACIAL FINDINGS Osseous: No facial fracture or mandibular dislocation. Orbits: The globes are intact. Normal appearance of the intra- and extraconal fat. Symmetric extraocular muscles and optic nerves. Sinuses: No fluid levels or advanced mucosal thickening. Soft tissues: Normal visualized extracranial soft tissues. CT CERVICAL SPINE FINDINGS Alignment: No static subluxation. Facets are aligned. Occipital condyles and the lateral masses of C1-C2 are aligned. Skull base and vertebrae: No acute fracture. Soft tissues and spinal canal: No prevertebral fluid or swelling. No visible canal hematoma. Disc levels: No advanced spinal canal or neural foraminal stenosis. Upper chest: Fluid filling the left hemithorax. Small right pleural effusion, incompletely visualized. Other: 1.8 cm left parotid mass. IMPRESSION: 1. No acute intracranial abnormality. 2. No facial fracture. 3. No acute fracture or static subluxation of the cervical spine. 4. Fluid filling the left hemithorax. Small right pleural effusion, incompletely visualized. 5. 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 Due to the overlap of the imaging features of benign and malignant parotid masses, histologic sampling is recommended. Electronically Signed   By: Deatra Robinson M.D.   On: 08/08/2023 19:19   DG Hand 2 View Left Result Date: 08/08/2023 CLINICAL DATA:  Recent fall with left hand pain, initial encounter EXAM: LEFT HAND - 2 VIEW COMPARISON:  None Available. FINDINGS: There is no evidence of fracture or dislocation. There is no evidence of arthropathy or other focal bone abnormality. Soft tissues are  unremarkable. IMPRESSION: No acute  abnormality noted. Electronically Signed   By: Alcide Clever M.D.   On: 08/08/2023 18:12   DG Hand 2 View Right Result Date: 08/08/2023 CLINICAL DATA:  Recent fall with right hand pain, initial encounter EXAM: RIGHT HAND - 2 VIEW COMPARISON:  None Available. FINDINGS: There is no evidence of fracture or dislocation. There is no evidence of arthropathy or other focal bone abnormality. Soft tissues are unremarkable. IMPRESSION: No acute abnormality noted. Electronically Signed   By: Alcide Clever M.D.   On: 08/08/2023 18:12   DG Chest Port 1 View Result Date: 08/08/2023 CLINICAL DATA:  Recent fall with chest pain, initial encounter EXAM: PORTABLE CHEST 1 VIEW COMPARISON:  07/20/2023 FINDINGS: Cardiac shadow is enlarged. Defibrillator is again noted and stable. Aortic calcifications are seen. Patient rotation to the right is noted accentuating the mediastinal markings. The lungs are well aerated. Small left pleural effusion remains. No new focal abnormality is noted. IMPRESSION: No significant change from the prior exam. Electronically Signed   By: Alcide Clever M.D.   On: 08/08/2023 18:04     Scheduled Meds:  amiodarone  200 mg Oral Daily   aspirin EC  81 mg Oral Daily   furosemide  40 mg Intravenous BID   imatinib  400 mg Oral QHS   metoprolol succinate  12.5 mg Oral Daily   thyroid  15 mg Oral QAC breakfast   Continuous Infusions:   LOS: 1 day    Time spent:    Zannie Cove, MD [facility]   08/09/2023, 11:20 AM

## 2023-08-09 NOTE — ED Notes (Addendum)
ED TO INPATIENT HANDOFF REPORT  ED Nurse Name and Phone #: Karlina Suares [phone]  S Name/Age/Gender Lisa [name] 82 y.o. female Room/Bed: 023C/023C  Code Status   Code Status: Full Code  Home/SNF/Other Home Patient oriented to: self, place, time, and situation Is this baseline? 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 Pt states this started around christmas, unable to keep anything down, 02 per ems 50 on RA. Placed on non rebriether by ems. Bp and hr stable. Pt is alert and oriented x 4. Pt also fell 2 days ago, called ems but did not go to er. Noticeable trauma / black eyes to face, bleeding and bruising noted to right hand   Allergies Allergies  Allergen Reactions   Flagyl [Metronidazole] Itching, Rash and Other (See Comments)    Welts, also   Penicillins Hives, Itching and Rash    Has patient had a PCN reaction causing immediate rash, facial/tongue/throat swelling, SOB or lightheadedness with hypotension: Yes  Has patient had a PCN reaction causing severe rash involving mucus membranes or skin necrosis: Yes Has patient had a PCN reaction that required hospitalization No Has patient had a PCN reaction occurring within the last 10 years: NO If all of the above answers are "NO", then may proceed with Cephalosporin use.    Sulfamethoxazole Rash   Meperidine Nausea And Vomiting   Morphine And Codeine Nausea And Vomiting   Propoxyphene     Other reaction(s): Unknown   Tape Other (See Comments)    "THE PLASTIC, CLEAR TAPE CAN/DOES PULL OFF MY SKIN."   Wheat Other (See Comments)    Runny nose   Doxycycline Nausea Only, Rash and Other (See Comments)    Made her "feel terrible"   Lanolin Itching and Rash   Sulfa Antibiotics Rash    Level of Care/Admitting Diagnosis ED Disposition     ED Disposition  Admit   Condition  --    Comment  Hospital [facility]: [facility] [[id number]]  Level of Care: Progressive [102]  Admit to Progressive based on following criteria: MULTISYSTEM THREATS such as stable sepsis, metabolic/electrolyte imbalance with or without encephalopathy that is responding to early treatment.  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   Cardiac arrest (HCC) 09/2015   CHF (congestive heart failure) (HCC)    CML (chronic myelocytic leukemia) (HCC)    Complication of anesthesia    Hx: UTI (urinary tract infection)    Hypertension    Melanoma (HCC)    PONV (postoperative nausea and vomiting)    PVC (premature ventricular contraction)    HISTORY OF   Thyroid disease    V tach (HCC)    Past Surgical History:  Procedure Laterality Date   ABDOMINAL HYSTERECTOMY     APPENDECTOMY  CARDIAC CATHETERIZATION N/A 10/03/2015   Procedure: Left Heart Cath and Coronary Angiography;  Surgeon: Peter M Swaziland, MD;  Location: [facility] INVASIVE CV LAB;  Service: Cardiovascular;  Laterality: N/A;   CARDIAC CATHETERIZATION N/A 10/07/2015   Procedure: Coronary Stent Intervention;  Surgeon: Kathleene Hazel, MD;  Location: [facility] INVASIVE CV LAB;  Service: Cardiovascular;  Laterality: N/A;   CARDIAC CATHETERIZATION  09/22/2019   EP IMPLANTABLE DEVICE N/A 01/20/2016   Procedure: BiV ICD Insertion CRT-D;  Surgeon: Marinus Maw, MD;  Location: [facility]  INVASIVE CV LAB;  Service: Cardiovascular;  Laterality: N/A;   IR THORACENTESIS ASP PLEURAL SPACE W/IMG GUIDE  07/20/2023   LEFT HEART CATH AND CORONARY ANGIOGRAPHY N/A 09/22/2019   Procedure: LEFT HEART CATH AND CORONARY ANGIOGRAPHY;  Surgeon: Swaziland, Peter M, MD;  Location: [facility] INVASIVE CV LAB;  Service: Cardiovascular;  Laterality: N/A;   TONSILLECTOMY     TONSILLECTOMY     TUBAL LIGATION       A IV Location/Drains/Wounds Patient Lines/Drains/Airways Status     Active Line/Drains/Airways     Name Placement date Placement time Site Days   Peripheral IV 08/08/23 20 G 1.88" Left Antecubital 08/08/23  1705  Antecubital  1   Pressure Injury 07/17/23 Heel Right Deep Tissue Pressure Injury - Purple or maroon localized area of discolored intact skin or blood-filled blister due to damage of underlying soft tissue from pressure and/or shear. 07/17/23  1615  -- 23   Wound / Incision (Open or Dehisced) 05/22/22 Other (Comment) Pretibial Distal;Right Lymphedema, blistered, weeping, indented 05/22/22  0523  Pretibial  444            Intake/Output Last 24 hours No intake or output data in the 24 hours ending 08/09/23 1700  Labs/Imaging Results for orders placed or performed during the hospital encounter of 08/08/23 (from the past 48 hours)  CBC with Differential     Status: Abnormal   Collection Time: 08/08/23  4:45 PM  Result Value Ref Range   WBC 8.6 4.0 - 10.5 K/uL   RBC 3.07 (L) 3.87 - 5.11 MIL/uL   Hemoglobin 9.8 (L) 12.0 - 15.0 g/dL   HCT 09.6 (L) 04.5 - 40.9 %   MCV 104.6 (H) 80.0 - 100.0 fL   MCH 31.9 26.0 - 34.0 pg   MCHC 30.5 30.0 - 36.0 g/dL   RDW 81.1 (H) 91.4 - 78.2 %   Platelets 223 150 - 400 K/uL   nRBC 0.0 0.0 - 0.2 %   Neutrophils Relative % 67 %   Neutro Abs 5.8 1.7 - 7.7 K/uL   Lymphocytes Relative 25 %   Lymphs Abs 2.1 0.7 - 4.0 K/uL   Monocytes Relative 7 %   Monocytes Absolute 0.6 0.1 - 1.0 K/uL   Eosinophils Relative 0 %   Eosinophils Absolute 0.0 0.0 - 0.5  K/uL   Basophils Relative 0 %   Basophils Absolute 0.0 0.0 - 0.1 K/uL   Immature Granulocytes 1 %   Abs Immature Granulocytes 0.04 0.00 - 0.07 K/uL    Comment: Performed at [facility], [address], [place], [place] [address]  Comprehensive metabolic panel     Status: Abnormal   Collection Time: 08/08/23  4:45 PM  Result Value Ref Range   Sodium 135 135 - 145 mmol/L   Potassium 4.3 3.5 - 5.1 mmol/L   Chloride 101 98 - 111 mmol/L   CO2 23 22 - 32 mmol/L   Glucose, Bld 149 (H) 70 -  99 mg/dL    Comment: Glucose reference range applies only to samples taken after fasting for at least 8 hours.   BUN 22 8 - 23 mg/dL   Creatinine, Ser 1.61 (H) 0.44 - 1.00 mg/dL   Calcium 8.4 (L) 8.9 - 10.3 mg/dL   Total Protein 6.8 6.5 - 8.1 g/dL   Albumin 3.3 (L) 3.5 - 5.0 g/dL   AST 096 (H) 15 - 41 U/L   ALT 194 (H) 0 - 44 U/L   Alkaline Phosphatase 87 38 - 126 U/L   Total Bilirubin 1.4 (H) <1.2 mg/dL   GFR, Estimated 24 (L) >60 mL/min    Comment: (NOTE) Calculated using the CKD-EPI Creatinine Equation (2021)    Anion gap 11 5 - 15    Comment: Performed at [facility], [address] [place], [place] [address]  Brain natriuretic peptide     Status: Abnormal   Collection Time: 08/08/23  4:45 PM  Result Value Ref Range   B Natriuretic Peptide 804.7 (H) 0.0 - 100.0 pg/mL    Comment: Performed at [facility], [address], [place], [place] [address]  Troponin I (High Sensitivity)     Status: Abnormal   Collection Time: 08/08/23  4:45 PM  Result Value Ref Range   Troponin I (High Sensitivity) 39 (H) <18 ng/L    Comment: (NOTE) Elevated high sensitivity troponin I (hsTnI) values and significant  changes across serial measurements may suggest ACS but many other  chronic and acute conditions are known to elevate hsTnI results.  Refer to the "Links" section for chest pain algorithms and additional  guidance. Performed at [facility], [address], [place],  [place] [address]   I-stat chem 8, ED (not at [facility], [facility] or [facility])     Status: Abnormal   Collection Time: 08/08/23  5:08 PM  Result Value Ref Range   Sodium 139 135 - 145 mmol/L   Potassium 4.4 3.5 - 5.1 mmol/L   Chloride 103 98 - 111 mmol/L   BUN 24 (H) 8 - 23 mg/dL   Creatinine, Ser 9.56 (H) 0.44 - 1.00 mg/dL   Glucose, Bld 213 (H) 70 - 99 mg/dL    Comment: Glucose reference range applies only to samples taken after fasting for at least 8 hours.   Calcium, Ion 1.16 1.15 - 1.40 mmol/L   TCO2 25 22 - 32 mmol/L   Hemoglobin 10.9 (L) 12.0 - 15.0 g/dL   HCT 08.6 (L) 57.8 - 46.9 %  I-Stat CG4 Lactic Acid     Status: None   Collection Time: 08/08/23  5:08 PM  Result Value Ref Range   Lactic Acid, Venous 1.8 0.5 - 1.9 mmol/L  I-Stat venous blood gas, ([facility] ED, [facility], [facility])     Status: Abnormal   Collection Time: 08/08/23  5:09 PM  Result Value Ref Range   pH, Ven 7.237 (L) 7.25 - 7.43   pCO2, Ven 60.2 (H) 44 - 60 mmHg   pO2, Ven 42 32 - 45 mmHg   Bicarbonate 25.6 20.0 - 28.0 mmol/L   TCO2 27 22 - 32 mmol/L   O2 Saturation 67 %   Acid-base deficit 3.0 (H) 0.0 - 2.0 mmol/L   Sodium 138 135 - 145 mmol/L   Potassium 4.4 3.5 - 5.1 mmol/L   Calcium, Ion 1.15 1.15 - 1.40 mmol/L   HCT 33.0 (L) 36.0 - 46.0 %   Hemoglobin 11.2 (L) 12.0 - 15.0 g/dL  Sample type VENOUS   Troponin I (High Sensitivity)     Status: Abnormal   Collection Time: 08/08/23  7:27 PM  Result Value Ref Range   Troponin I (High Sensitivity) 46 (H) <18 ng/L    Comment: (NOTE) Elevated high sensitivity troponin I (hsTnI) values and significant  changes across serial measurements may suggest ACS but many other  chronic and acute conditions are known to elevate hsTnI results.  Refer to the "Links" section for chest pain algorithms and additional  guidance. Performed at [facility], [address] [place], [place] [address]   Magnesium     Status: None   Collection Time: 08/08/23  7:27 PM  Result Value Ref Range    Magnesium 2.0 1.7 - 2.4 mg/dL    Comment: Performed at [facility], [address] [place], [place] [address]  Procalcitonin     Status: None   Collection Time: 08/08/23  7:27 PM  Result Value Ref Range   Procalcitonin <0.10 ng/mL    Comment:        Interpretation: PCT (Procalcitonin) <= 0.5 ng/mL: Systemic infection (sepsis) is not likely. Local bacterial infection is possible. (NOTE)       Sepsis PCT Algorithm           Lower Respiratory Tract                                      Infection PCT Algorithm    ----------------------------     ----------------------------         PCT < 0.25 ng/mL                PCT < 0.10 ng/mL          Strongly encourage             Strongly discourage   discontinuation of antibiotics    initiation of antibiotics    ----------------------------     -----------------------------       PCT 0.25 - 0.50 ng/mL            PCT 0.10 - 0.25 ng/mL               OR       >80% decrease in PCT            Discourage initiation of                                            antibiotics      Encourage discontinuation           of antibiotics    ----------------------------     -----------------------------         PCT >= 0.50 ng/mL              PCT 0.26 - 0.50 ng/mL               AND        <80% decrease in PCT             Encourage initiation of  antibiotics       Encourage continuation           of antibiotics    ----------------------------     -----------------------------        PCT >= 0.50 ng/mL                  PCT > 0.50 ng/mL               AND         increase in PCT                  Strongly encourage                                      initiation of antibiotics    Strongly encourage escalation           of antibiotics                                     -----------------------------                                           PCT <= 0.25 ng/mL                                                 OR                                         > 80% decrease in PCT                                      Discontinue / Do not initiate                                             antibiotics  Performed at [facility], [address] [place], [place] [address]   TSH     Status: None   Collection Time: 08/08/23  7:27 PM  Result Value Ref Range   TSH 2.287 0.350 - 4.500 uIU/mL    Comment: Performed by a 3rd Generation assay with a functional sensitivity of <=0.01 uIU/mL. Performed at [facility], [address], [place], [place] [address]   Amylase, pleural or peritoneal fluid        Status: None   Collection Time: 08/08/23  8:08 PM  Result Value Ref Range   Amylase, Fluid 26 U/L    Comment: NO NORMAL RANGE ESTABLISHED FOR THIS TEST Performed at [facility], [address], [place], [place] [address]    Fluid Type-FAMY PLEURAL FL, LEFT LUNG     Comment: Performed at [facility], [address], [place], [place] [address]  Body fluid culture w Gram Stain     Status:  None (Preliminary result)   Collection Time: 08/08/23  9:34 PM   Specimen: Pleural Fluid  Result Value Ref Range   Specimen Description FLUID PLEURAL    Special Requests NONE    Gram Stain NO WBC SEEN NO ORGANISMS SEEN     Culture      NO GROWTH < 12 HOURS Performed at [facility], [address], [place], [place] [address]    Report Status PENDING   Blood culture (routine x 2)     Status: None (Preliminary result)   Collection Time: 08/08/23  9:36 PM   Specimen: BLOOD RIGHT ARM  Result Value Ref Range   Specimen Description BLOOD RIGHT ARM    Special Requests      BOTTLES DRAWN AEROBIC AND ANAEROBIC Blood Culture adequate volume   Culture      NO GROWTH < 24 HOURS Performed at [facility], [address], [place], [place] [address]    Report Status PENDING   Blood culture (routine x 2)     Status: None (Preliminary result)   Collection Time: 08/08/23  9:36 PM   Specimen:  BLOOD LEFT HAND  Result Value Ref Range   Specimen Description BLOOD LEFT HAND    Special Requests      BOTTLES DRAWN AEROBIC AND ANAEROBIC Blood Culture adequate volume   Culture      NO GROWTH < 24 HOURS Performed at [facility], [address] [place], [place] [address]    Report Status PENDING   Lactate dehydrogenase     Status: Abnormal   Collection Time: 08/08/23  9:36 PM  Result Value Ref Range   LDH 551 (H) 98 - 192 U/L    Comment: Performed at [facility], [address] [place], [place] [address]  Body fluid cell count with differential     Status: Abnormal   Collection Time: 08/08/23  9:39 PM  Result Value Ref Range   Fluid Type-FCT PLEURAL    Color, Fluid YELLOW    Appearance, Fluid CLEAR CLEAR   Total Nucleated Cell Count, Fluid 152 0 - 1,000 cu mm   Neutrophil Count, Fluid 4 0 - 25 %   Lymphs, Fluid 74 %   Monocyte-Macrophage-Serous Fluid 22 (L) 50 - 90 %   Eos, Fluid 0 %   Other Cells, Fluid FEW MESOTHELIAL CELLS %    Comment: Performed at [facility], [address], [place], [place] [address]  Pathologist smear review     Status: None   Collection Time: 08/08/23  9:39 PM  Result Value Ref Range   Path Review            Comment:  Reactive mesothelial cells Reviewed by Beulah Gandy. Luisa Hart, M.D.  08/09/2023 Performed at [facility], [address] [place], [place] [address]   Albumin, pleural or peritoneal fluid      Status: None   Collection Time: 08/08/23  9:40 PM  Result Value Ref Range   Albumin, Fluid 2.1 g/dL    Comment: (NOTE) No normal range established for this test Results should be evaluated in conjunction with serum values    Fluid Type-FALB PLEURAL     Comment: Performed at [facility], [address], [place], [place] [address]  Glucose, pleural or peritoneal fluid     Status: None   Collection Time: 08/08/23  9:40 PM  Result Value Ref Range   Glucose, Fluid 141 mg/dL  Comment: (NOTE) No normal range  established for this test Results should be evaluated in conjunction with serum values    Fluid Type-FGLU PLEURAL     Comment: Performed at [facility], [address], [place], [place] [address]  Lactate dehydrogenase (pleural or peritoneal fluid)     Status: Abnormal   Collection Time: 08/08/23  9:40 PM  Result Value Ref Range   LD, Fluid 151 (H) 3 - 23 U/L    Comment: (NOTE) Results should be evaluated in conjunction with serum values    Fluid Type-FLDH PLEURAL     Comment: Performed at [facility], [address], [place], [place] [address]  I-Stat CG4 Lactic Acid     Status: None   Collection Time: 08/08/23  9:50 PM  Result Value Ref Range   Lactic Acid, Venous 1.4 0.5 - 1.9 mmol/L  Protein, pleural or peritoneal fluid     Status: None   Collection Time: 08/08/23  9:54 PM  Result Value Ref Range   Total protein, fluid 3.6 g/dL    Comment: (NOTE) No normal range established for this test Results should be evaluated in conjunction with serum values    Fluid Type-FTP PLEURAL FL, LEFT LUNG     Comment: Performed at [facility], [address], [place], [place] [address] CORRECTED ON 12/30 AT 4696: PREVIOUSLY REPORTED AS Pleural Fld   CBC with Differential/Platelet     Status: Abnormal   Collection Time: 08/09/23  4:27 AM  Result Value Ref Range   WBC 6.9 4.0 - 10.5 K/uL   RBC 2.56 (L) 3.87 - 5.11 MIL/uL   Hemoglobin 8.1 (L) 12.0 - 15.0 g/dL   HCT 29.5 (L) 28.4 - 13.2 %   MCV 100.8 (H) 80.0 - 100.0 fL   MCH 31.6 26.0 - 34.0 pg   MCHC 31.4 30.0 - 36.0 g/dL   RDW 44.0 (H) 10.2 - 72.5 %   Platelets 158 150 - 400 K/uL   nRBC 0.0 0.0 - 0.2 %   Neutrophils Relative % 79 %   Neutro Abs 5.5 1.7 - 7.7 K/uL   Lymphocytes Relative 13 %   Lymphs Abs 0.9 0.7 - 4.0 K/uL   Monocytes Relative 7 %   Monocytes Absolute 0.5 0.1 - 1.0 K/uL   Eosinophils Relative 0 %   Eosinophils Absolute 0.0 0.0 - 0.5 K/uL   Basophils Relative 0 %   Basophils Absolute 0.0 0.0 -  0.1 K/uL   Immature Granulocytes 1 %   Abs Immature Granulocytes 0.04 0.00 - 0.07 K/uL    Comment: Performed at [facility], [address] [place], [place] [address]  Comprehensive metabolic panel     Status: Abnormal   Collection Time: 08/09/23  4:27 AM  Result Value Ref Range   Sodium 139 135 - 145 mmol/L   Potassium 4.3 3.5 - 5.1 mmol/L   Chloride 105 98 - 111 mmol/L   CO2 26 22 - 32 mmol/L   Glucose, Bld 113 (H) 70 - 99 mg/dL    Comment: Glucose reference range applies only to samples taken after fasting for at least 8 hours.   BUN 23 8 - 23 mg/dL   Creatinine, Ser 0.34 (H) 0.44 - 1.00 mg/dL   Calcium 8.0 (L) 8.9 - 10.3 mg/dL   Total Protein 5.3 (L) 6.5 - 8.1 g/dL   Albumin 2.6 (L) 3.5 - 5.0 g/dL   AST 742 (H) 15 - 41 U/L  ALT 204 (H) 0 - 44 U/L   Alkaline Phosphatase 66 38 - 126 U/L   Total Bilirubin 1.2 (H) <1.2 mg/dL   GFR, Estimated 24 (L) >60 mL/min    Comment: (NOTE) Calculated using the CKD-EPI Creatinine Equation (2021)    Anion gap 8 5 - 15    Comment: Performed at [facility], [address], [place], [place] [address]  Magnesium     Status: None   Collection Time: 08/09/23  4:27 AM  Result Value Ref Range   Magnesium 1.8 1.7 - 2.4 mg/dL    Comment: Performed at [facility], [address] [place], [place] [address]  Phosphorus     Status: None   Collection Time: 08/09/23  4:27 AM  Result Value Ref Range   Phosphorus 3.8 2.5 - 4.6 mg/dL    Comment: Performed at [facility], [address] [place], [place] [address]  Blood gas, venous     Status: Abnormal   Collection Time: 08/09/23  4:27 AM  Result Value Ref Range   pH, Ven 7.28 7.25 - 7.43   pCO2, Ven 57 44 - 60 mmHg   pO2, Ven 75 (H) 32 - 45 mmHg   Bicarbonate 26.8 20.0 - 28.0 mmol/L   Acid-base deficit 1.0 0.0 - 2.0 mmol/L   O2 Saturation 95.9 %   Patient temperature 37.0     Comment: Performed at [facility], [address] [place], [place] [address]   Protime-INR     Status: Abnormal   Collection Time: 08/09/23  4:27 AM  Result Value Ref Range   Prothrombin Time 18.5 (H) 11.4 - 15.2 seconds   INR 1.5 (H) 0.8 - 1.2    Comment: (NOTE) INR goal varies based on device and disease states. Performed at [facility], [address] [place], [place] [address]   Vitamin B12     Status: Abnormal   Collection Time: 08/09/23  4:27 AM  Result Value Ref Range   Vitamin B-12 1,167 (H) 180 - 914 pg/mL    Comment: (NOTE) This assay is not validated for testing neonatal or myeloproliferative syndrome specimens for Vitamin B12 levels. Performed at [facility], [address], [place], [place] [address]   APTT     Status: None   Collection Time: 08/09/23  4:27 AM  Result Value Ref Range   aPTT 26 24 - 36 seconds    Comment: Performed at [facility], [address], [place], [place] [address]  Acetaminophen level     Status: Abnormal   Collection Time: 08/09/23  4:27 AM  Result Value Ref Range   Acetaminophen (Tylenol), Serum <10 (L) 10 - 30 ug/mL    Comment: (NOTE) Therapeutic concentrations vary significantly. A range of 10-30 ug/mL  may be an effective concentration for many patients. However, some  are best treated at concentrations outside of this range. Acetaminophen concentrations >150 ug/mL at 4 hours after ingestion  and >50 ug/mL at 12 hours after ingestion are often associated with  toxic reactions.  Performed at [facility], [address], [place], [place] [address]   Ethanol     Status: None   Collection Time: 08/09/23  4:27 AM  Result Value Ref Range   Alcohol, Ethyl (B) <10 <10 mg/dL    Comment: (NOTE) Lowest detectable limit for serum alcohol is 10 mg/dL.  For medical purposes only. Performed at [facility]  Lab, [address] [place], [place] [address]   Hepatitis panel, acute     Status: None   Collection Time: 08/09/23  4:27 AM  Result Value Ref Range   Hepatitis B Surface Ag  NON REACTIVE NON REACTIVE   HCV Ab NON REACTIVE NON REACTIVE    Comment: (NOTE) Nonreactive HCV antibody screen is consistent with no HCV infections,  unless recent infection is suspected or other evidence exists to indicate HCV infection.     Hep A IgM NON REACTIVE NON REACTIVE   Hep B C IgM NON REACTIVE NON REACTIVE    Comment: Performed at [facility], [address], [place], [place] [address]  Troponin I (High Sensitivity)     Status: Abnormal   Collection Time: 08/09/23  4:27 AM  Result Value Ref Range   Troponin I (High Sensitivity) 53 (H) <18 ng/L    Comment: (NOTE) Elevated high sensitivity troponin I (hsTnI) values and significant  changes across serial measurements may suggest ACS but many other  chronic and acute conditions are known to elevate hsTnI results.  Refer to the "Links" section for chest pain algorithms and additional  guidance. Performed at [facility], [address] [place], [place] [address]   Urinalysis, Complete w Microscopic -Urine, Clean Catch     Status: Abnormal   Collection Time: 08/09/23  7:42 AM  Result Value Ref Range   Color, Urine YELLOW YELLOW   APPearance CLEAR CLEAR   Specific Gravity, Urine 1.009 1.005 - 1.030   pH 5.0 5.0 - 8.0   Glucose, UA NEGATIVE NEGATIVE mg/dL   Hgb urine dipstick NEGATIVE NEGATIVE   Bilirubin Urine NEGATIVE NEGATIVE   Ketones, ur NEGATIVE NEGATIVE mg/dL   Protein, ur NEGATIVE NEGATIVE mg/dL   Nitrite NEGATIVE NEGATIVE   Leukocytes,Ua TRACE (A) NEGATIVE   RBC / HPF 0-5 0 - 5 RBC/hpf   WBC, UA 0-5 0 - 5 WBC/hpf   Bacteria, UA RARE (A) NONE SEEN   Squamous Epithelial / HPF 0-5 0 - 5 /HPF   Mucus PRESENT    Non Squamous Epithelial 0-5 (A) NONE SEEN    Comment: Performed at [facility], [address], [place], [place] [address]  Sodium, urine, random     Status: None   Collection Time: 08/09/23  7:42 AM  Result Value Ref Range   Sodium, Ur 98 mmol/L    Comment: Performed at [facility], [address], [place], [place] [address]  Creatinine, urine, random     Status: None   Collection Time: 08/09/23  7:42 AM  Result Value Ref Range   Creatinine, Urine 55 mg/dL    Comment: Performed at [facility], [address], [place], [place] [address]   ECHOCARDIOGRAM COMPLETE Result Date: 08/09/2023    ECHOCARDIOGRAM REPORT   Patient Name:   Lisa [name] Date of Exam: 08/09/2023 Medical Rec #:  [id number]          Height:       62.0 in Accession #:    [id number]         Weight:       206.2 lb Date of Birth:  [birth year]           BSA:          1.937 m Patient Age:    82 years           BP:  124/48 mmHg Patient Gender: F                  HR:           67 bpm. Exam Location:  Inpatient Procedure: 2D Echo, Cardiac Doppler and Color Doppler Indications:    CHF  History:        Patient has prior history of Echocardiogram examinations, most                 recent 07/18/2023. CHF, Signs/Symptoms:Chest Pain; Risk                 Factors:Hypertension.  Sonographer:    Webb Laws Referring Phys: [id number] JUSTIN B HOWERTER IMPRESSIONS  1. Left ventricular ejection fraction, by estimation, is 55 to 60%. The left ventricle has normal function. The left ventricle has no regional wall motion abnormalities. Left ventricular diastolic parameters are consistent with Grade I diastolic dysfunction (impaired relaxation).  2. Right ventricular systolic function is normal. The right ventricular size is normal.  3. Large pleural effusion.  4. The mitral valve is normal in structure. No evidence of mitral valve regurgitation. No evidence of mitral stenosis.  5. The aortic valve is normal in structure. Aortic valve regurgitation is not visualized. No aortic stenosis is present.  6. The inferior vena cava is dilated in size with <50% respiratory variability, suggesting right atrial pressure of 15 mmHg. FINDINGS  Left Ventricle: Left ventricular ejection fraction, by estimation, is 55 to 60%. The  left ventricle has normal function. The left ventricle has no regional wall motion abnormalities. The left ventricular internal cavity size was normal in size. There is  no left ventricular hypertrophy. Left ventricular diastolic parameters are consistent with Grade I diastolic dysfunction (impaired relaxation). Indeterminate filling pressures. Right Ventricle: The right ventricular size is normal. No increase in right ventricular wall thickness. Right ventricular systolic function is normal. Left Atrium: Left atrial size was normal in size. Right Atrium: Right atrial size was normal in size. Pericardium: There is no evidence of pericardial effusion. Mitral Valve: The mitral valve is normal in structure. No evidence of mitral valve regurgitation. No evidence of mitral valve stenosis. Tricuspid Valve: The tricuspid valve is normal in structure. Tricuspid valve regurgitation is not demonstrated. No evidence of tricuspid stenosis. Aortic Valve: The aortic valve is normal in structure. Aortic valve regurgitation is not visualized. No aortic stenosis is present. Pulmonic Valve: The pulmonic valve was normal in structure. Pulmonic valve regurgitation is not visualized. No evidence of pulmonic stenosis. Aorta: The aortic root is normal in size and structure. Venous: The inferior vena cava is dilated in size with less than 50% respiratory variability, suggesting right atrial pressure of 15 mmHg. IAS/Shunts: No atrial level shunt detected by color flow Doppler. Additional Comments: There is a large pleural effusion.  LEFT VENTRICLE PLAX 2D LVIDd:         4.80 cm      Diastology LVIDs:         4.30 cm      LV e' medial:    6.85 cm/s LV PW:         0.70 cm      LV E/e' medial:  10.8 LV IVS:        0.60 cm      LV e' lateral:   6.31 cm/s LVOT diam:     1.50 cm      LV E/e' lateral: 11.7 LV SV:  53 LV SV Index:   27 LVOT Area:     1.77 cm  LV Volumes (MOD) LV vol d, MOD A2C: 129.0 ml LV vol d, MOD A4C: 81.3 ml LV vol s,  MOD A2C: 46.3 ml LV vol s, MOD A4C: 36.1 ml LV SV MOD A2C:     82.7 ml LV SV MOD A4C:     81.3 ml LV SV MOD BP:      63.6 ml RIGHT VENTRICLE             IVC RV Basal diam:  2.40 cm     IVC diam: 2.30 cm RV S prime:     13.80 cm/s TAPSE (M-mode): 3.9 cm LEFT ATRIUM             Index        RIGHT ATRIUM           Index LA diam:        4.60 cm 2.38 cm/m   RA Area:     12.90 cm LA Vol (A2C):   56.0 ml 28.92 ml/m  RA Volume:   23.40 ml  12.08 ml/m LA Vol (A4C):   50.8 ml 26.23 ml/m LA Biplane Vol: 56.1 ml 28.97 ml/m  AORTIC VALVE LVOT Vmax:   138.00 cm/s LVOT Vmean:  95.000 cm/s LVOT VTI:    0.299 m  AORTA Ao Asc diam: 3.40 cm MITRAL VALVE MV Area (PHT): 3.86 cm    SHUNTS MV E velocity: 73.70 cm/s  Systemic VTI:  0.30 m MV A velocity: 86.60 cm/s  Systemic Diam: 1.50 cm MV E/A ratio:  0.85 Chilton Si MD Electronically signed by Chilton Si MD Signature Date/Time: 08/09/2023/3:33:30 PM    Final    DG Chest Portable 1 View Result Date: 08/08/2023 CLINICAL DATA:  Shortness of breath.  Status post thoracentesis. EXAM: PORTABLE CHEST 1 VIEW COMPARISON:  Chest radiograph dated 08/08/2023 at 5:34 p.m. FINDINGS: Lines/tubes: Left chest wall ICD leads project over the right atrium and ventricle and tributary of the coronary sinus. Lungs: Low lung volumes. Bilateral interstitial opacities. Bibasilar patchy opacities. Pleura: Small right and decreased small left pleural effusions. No pneumothorax. Heart/mediastinum: Similar enlarged cardiomediastinal silhouette. Bones: No acute osseous abnormality. IMPRESSION: 1. Small right and decreased small left pleural effusions. No pneumothorax. 2. Bilateral interstitial opacities and bibasilar patchy opacities, likely pulmonary edema. Electronically Signed   By: Agustin Cree M.D.   On: 08/08/2023 21:12   CT CHEST ABDOMEN PELVIS WO CONTRAST Result Date: 08/08/2023 CLINICAL DATA:  Sepsis.  Recent fall. EXAM: CT CHEST, ABDOMEN AND PELVIS WITHOUT CONTRAST TECHNIQUE:  Multidetector CT imaging of the chest, abdomen and pelvis was performed following the standard protocol without IV contrast. RADIATION DOSE REDUCTION: This exam was performed according to the departmental dose-optimization program which includes automated exposure control, adjustment of the mA and/or kV according to patient size and/or use of iterative reconstruction technique. COMPARISON:  Radiograph earlier today. 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 Aortic atherosclerosis without aneurysm. The heart is mildly enlarged. No pericardial effusion. Mediastinum/Nodes: Lack of contrast limits hilar assessment. No gross mediastinal adenopathy. No mediastinal hemorrhage. Decompressed esophagus, no pneumomediastinum. Subcentimeter thyroid nodules. Not clinically significant; no follow-up imaging recommended (ref: J Am Coll Radiol. 2015 Feb;12(2): 143-50). Lungs/Pleura: Large left and moderate right pleural effusion, simple fluid density. Complete collapse/compressive atelectasis of the left lower lobe with subtotal atelectasis of the anterior left upper lobe. Only small portion of left upper  lobe is aerated in the hemithorax. No evidence of endobronchial or tracheal debris or obstructing lesion. Compressive atelectasis in the dependent right lung. Perifissural atelectasis in the right upper and middle lobes. No evidence of pulmonary mass. Musculoskeletal: There are no acute or suspicious osseous abnormalities. Age related degenerative change in the spine. Bilateral breast implants which are partially calcified. CT ABDOMEN PELVIS FINDINGS Hepatobiliary: Subtle capsular nodularity of the liver suspicious for cirrhosis. 8 mm hypodensity in the subcapsular right lobe of the liver is unchanged from prior exam and likely cyst. Layering sludge or stones in the gallbladder. No pericholecystic inflammation. No biliary dilatation. No evidence of hepatic injury.  Pancreas: Unremarkable unenhanced appearance. Spleen: Unremarkable unenhanced appearance. 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 No further follow-up imaging is recommended. Normal right adrenal gland. No evidence of adrenal hemorrhage or injury. Bilateral renal parenchymal thinning without hydronephrosis or focal renal abnormality on this unenhanced exam. Stomach/Bowel: Bowel assessment is limited in the absence of contrast, arms down positioning and motion. The stomach is decompressed. No evidence of bowel obstruction or inflammation. Small volume of stool in the colon. Vascular/Lymphatic: Advanced aortic and branch atherosclerosis. No aortic aneurysm. No retroperitoneal fluid or perivascular haziness to suggest injury. Scattered small retroperitoneal and periportal nodes are typically reactive. No suspicious adenopathy. Reproductive: Status post hysterectomy. No adnexal masses. Other: Small volume simple ascites in the abdomen and pelvis. No evidence of hemorrhage. No free air. Mild generalized body wall edema. Musculoskeletal: There are no acute or suspicious osseous abnormalities. Moderate degenerative change in the lumbar spine. IMPRESSION: 1. Large left pleural effusion with subtotal collapse/atelectasis of the left lung. Only small portion of the left upper lobe is aerated. Moderate-sized right pleural effusion with atelectasis throughout the right lung. 2. Small volume simple ascites in the abdomen and pelvis. Mild generalized body wall edema. 3. Subtle capsular nodularity of the liver suspicious for cirrhosis. 4. Cholelithiasis without evidence of cholecystitis. Aortic Atherosclerosis (ICD10-I70.0). Electronically Signed   By: Narda Rutherford M.D.   On: 08/08/2023 19:24   CT HEAD WO CONTRAST ( ) Result Date: 08/08/2023 CLINICAL DATA:  Trauma EXAM: CT HEAD WITHOUT CONTRAST CT MAXILLOFACIAL WITHOUT CONTRAST CT CERVICAL SPINE WITHOUT CONTRAST TECHNIQUE: Multidetector  CT imaging of the head, cervical spine, and maxillofacial structures were performed using the standard protocol without intravenous contrast. Multiplanar CT image reconstructions of the cervical spine and maxillofacial structures were also generated. RADIATION DOSE REDUCTION: This exam was performed according to the departmental dose-optimization program which includes automated exposure control, adjustment of the mA and/or kV according to patient size and/or use of iterative reconstruction technique. COMPARISON:  05/01/2020 FINDINGS: CT HEAD FINDINGS Brain: There is no mass, hemorrhage or extra-axial collection. The size and configuration of the ventricles and extra-axial CSF spaces are normal. There is hypoattenuation of the white matter, most commonly indicating chronic small vessel disease. Vascular: Atherosclerotic calcification of the internal carotid arteries at the skull base. No abnormal hyperdensity of the major intracranial arteries or dural venous sinuses. Skull: The visualized skull base, calvarium and extracranial soft tissues are normal. Sinuses/Orbits: No fluid levels or advanced mucosal thickening of the visualized paranasal sinuses. No mastoid or middle ear effusion. Normal orbits. Other: None. CT MAXILLOFACIAL FINDINGS Osseous: No facial fracture or mandibular dislocation. Orbits: The globes are intact. Normal appearance of the intra- and extraconal fat. Symmetric extraocular muscles and optic nerves. Sinuses: No fluid levels or advanced mucosal thickening. Soft tissues: Normal visualized extracranial soft tissues. CT CERVICAL SPINE FINDINGS Alignment: No static  subluxation. Facets are aligned. Occipital condyles and the lateral masses of C1-C2 are aligned. Skull base and vertebrae: No acute fracture. Soft tissues and spinal canal: No prevertebral fluid or swelling. No visible canal hematoma. Disc levels: No advanced spinal canal or neural foraminal stenosis. Upper chest: Fluid filling the left  hemithorax. Small right pleural effusion, incompletely visualized. Other: 1.8 cm left parotid mass. IMPRESSION: 1. No acute intracranial abnormality. 2. No facial fracture. 3. No acute fracture or static subluxation of the cervical spine. 4. Fluid filling the left hemithorax. Small right pleural effusion, incompletely visualized. 5. 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 Due to the overlap of the imaging features of benign and malignant parotid masses, histologic sampling is recommended. Electronically Signed   By: Deatra Robinson M.D.   On: 08/08/2023 19:19   CT Maxillofacial Wo Contrast Result Date: 08/08/2023 CLINICAL DATA:  Trauma EXAM: CT HEAD WITHOUT CONTRAST CT MAXILLOFACIAL WITHOUT CONTRAST CT CERVICAL SPINE WITHOUT CONTRAST TECHNIQUE: Multidetector CT imaging of the head, cervical spine, and maxillofacial structures were performed using the standard protocol without intravenous contrast. Multiplanar CT image reconstructions of the cervical spine and maxillofacial structures were also generated. RADIATION DOSE REDUCTION: This exam was performed according to the departmental dose-optimization program which includes automated exposure control, adjustment of the mA and/or kV according to patient size and/or use of iterative reconstruction technique. COMPARISON:  05/01/2020 FINDINGS: CT HEAD FINDINGS Brain: There is no mass, hemorrhage or extra-axial collection. The size and configuration of the ventricles and extra-axial CSF spaces are normal. There is hypoattenuation of the white matter, most commonly indicating chronic small vessel disease. Vascular: Atherosclerotic calcification of the internal carotid arteries at the skull base. No abnormal hyperdensity of the major intracranial arteries or dural venous sinuses. Skull: The visualized skull base, calvarium and extracranial soft tissues are normal. Sinuses/Orbits: No fluid levels or advanced mucosal thickening of the visualized paranasal sinuses. No mastoid or middle  ear effusion. Normal orbits. Other: None. CT MAXILLOFACIAL FINDINGS Osseous: No facial fracture or mandibular dislocation. Orbits: The globes are intact. Normal appearance of the intra- and extraconal fat. Symmetric extraocular muscles and optic nerves. Sinuses: No fluid levels or advanced mucosal thickening. Soft tissues: Normal visualized extracranial soft tissues. CT CERVICAL SPINE FINDINGS Alignment: No static subluxation. Facets are aligned. Occipital condyles and the lateral masses of C1-C2 are aligned. Skull base and vertebrae: No acute fracture. Soft tissues and spinal canal: No prevertebral fluid or swelling. No visible canal hematoma. Disc levels: No advanced spinal canal or neural foraminal stenosis. Upper chest: Fluid filling the left hemithorax. Small right pleural effusion, incompletely visualized. Other: 1.8 cm left parotid mass. IMPRESSION: 1. No acute intracranial abnormality. 2. No facial fracture. 3. No acute fracture or static subluxation of the cervical spine. 4. Fluid filling the left hemithorax. Small right pleural effusion, incompletely visualized. 5. 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 Due to the overlap of the imaging features of benign and malignant parotid masses, histologic sampling is recommended. Electronically Signed   By: Deatra Robinson M.D.   On: 08/08/2023 19:19   CT Cervical Spine Wo Contrast Result Date: 08/08/2023 CLINICAL DATA:  Trauma EXAM: CT HEAD WITHOUT CONTRAST CT MAXILLOFACIAL WITHOUT CONTRAST CT CERVICAL SPINE WITHOUT CONTRAST TECHNIQUE: Multidetector CT imaging of the head, cervical spine, and maxillofacial structures were performed using the standard protocol without intravenous contrast. Multiplanar CT image reconstructions of the cervical spine and maxillofacial structures were also generated. RADIATION DOSE REDUCTION: This exam was performed according to the departmental dose-optimization program which  includes automated exposure control, adjustment of the mA and/or kV  according to patient size and/or use of iterative reconstruction technique. COMPARISON:  05/01/2020 FINDINGS: CT HEAD FINDINGS Brain: There is no mass, hemorrhage or extra-axial collection. The size and configuration of the ventricles and extra-axial CSF spaces are normal. There is hypoattenuation of the white matter, most commonly indicating chronic small vessel disease. Vascular: Atherosclerotic calcification of the internal carotid arteries at the skull base. No abnormal hyperdensity of the major intracranial arteries or dural venous sinuses. Skull: The visualized skull base, calvarium and extracranial soft tissues are normal. Sinuses/Orbits: No fluid levels or advanced mucosal thickening of the visualized paranasal sinuses. No mastoid or middle ear effusion. Normal orbits. Other: None. CT MAXILLOFACIAL FINDINGS Osseous: No facial fracture or mandibular dislocation. Orbits: The globes are intact. Normal appearance of the intra- and extraconal fat. Symmetric extraocular muscles and optic nerves. Sinuses: No fluid levels or advanced mucosal thickening. Soft tissues: Normal visualized extracranial soft tissues. CT CERVICAL SPINE FINDINGS Alignment: No static subluxation. Facets are aligned. Occipital condyles and the lateral masses of C1-C2 are aligned. Skull base and vertebrae: No acute fracture. Soft tissues and spinal canal: No prevertebral fluid or swelling. No visible canal hematoma. Disc levels: No advanced spinal canal or neural foraminal stenosis. Upper chest: Fluid filling the left hemithorax. Small right pleural effusion, incompletely visualized. Other: 1.8 cm left parotid mass. IMPRESSION: 1. No acute intracranial abnormality. 2. No facial fracture. 3. No acute fracture or static subluxation of the cervical spine. 4. Fluid filling the left hemithorax. Small right pleural effusion, incompletely visualized. 5. 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 Due to the overlap of the imaging features of benign and malignant  parotid masses, histologic sampling is recommended. Electronically Signed   By: Deatra Robinson M.D.   On: 08/08/2023 19:19   DG Hand 2 View Left Result Date: 08/08/2023 CLINICAL DATA:  Recent fall with left hand pain, initial encounter EXAM: LEFT HAND - 2 VIEW COMPARISON:  None Available. FINDINGS: There is no evidence of fracture or dislocation. There is no evidence of arthropathy or other focal bone abnormality. Soft tissues are unremarkable. IMPRESSION: No acute abnormality noted. Electronically Signed   By: Alcide Clever M.D.   On: 08/08/2023 18:12   DG Hand 2 View Right Result Date: 08/08/2023 CLINICAL DATA:  Recent fall with right hand pain, initial encounter EXAM: RIGHT HAND - 2 VIEW COMPARISON:  None Available. FINDINGS: There is no evidence of fracture or dislocation. There is no evidence of arthropathy or other focal bone abnormality. Soft tissues are unremarkable. IMPRESSION: No acute abnormality noted. Electronically Signed   By: Alcide Clever M.D.   On: 08/08/2023 18:12   DG Chest Port 1 View Result Date: 08/08/2023 CLINICAL DATA:  Recent fall with chest pain, initial encounter EXAM: PORTABLE CHEST 1 VIEW COMPARISON:  07/20/2023 FINDINGS: Cardiac shadow is enlarged. Defibrillator is again noted and stable. Aortic calcifications are seen. Patient rotation to the right is noted accentuating the mediastinal markings. The lungs are well aerated. Small left pleural effusion remains. No new focal abnormality is noted. IMPRESSION: No significant change from the prior exam. Electronically Signed   By: Alcide Clever M.D.   On: 08/08/2023 18:04    Pending Labs Unresulted Labs (From admission, onward)     Start     Ordered   08/10/23 0500  CBC  Tomorrow morning,   R        08/09/23 1141   08/10/23 0500  Basic metabolic panel  Tomorrow morning,   R        08/09/23 1141   08/09/23 1142  Vitamin B12  (Anemia Panel (PNL))  Once,   R        08/09/23 1141   08/09/23 1142  Folate  (Anemia Panel  (PNL))  Once,   R        08/09/23 1141   08/09/23 1142  Iron and TIBC  (Anemia Panel (PNL))  Once,   R        08/09/23 1141   08/09/23 1142  Ferritin  (Anemia Panel (PNL))  Once,   R        08/09/23 1141   08/09/23 1142  Reticulocytes  (Anemia Panel (PNL))  Once,   R        08/09/23 1141   08/09/23 1135  Lactate dehydrogenase  Add-on,   AD        08/09/23 1134   08/08/23 2344  Rapid urine drug screen (hospital performed)  Add-on,   AD        08/08/23 2344   08/08/23 2008  Albumin, pleural or peritoneal fluid   (Thoracentesis Labs Panel)  Once,   URGENT        08/08/23 2007   08/08/23 2008  Glucose, pleural or peritoneal fluid  (Thoracentesis Labs Panel)  Once,   URGENT        08/08/23 2007   08/08/23 2008  Lactate dehydrogenase (pleural or peritoneal fluid)  (Thoracentesis Labs Panel)  Once,   URGENT        08/08/23 2007            Vitals/Pain Today's Vitals   08/09/23 1215 08/09/23 1500 08/09/23 1511 08/09/23 1515  BP: (!) 120/49 (!) 114/48  (!) 116/46  Pulse: 62 65  65  Resp: 17 (!) 22  (!) 23  Temp:   97.9 F (36.6 C)   TempSrc:   Oral   SpO2: 100% 100%  100%  PainSc:        Isolation Precautions No active isolations  Medications Medications  lidocaine (PF) (XYLOCAINE) 1 % injection (  Not Given 08/08/23 2335)  lidocaine-EPINEPHrine (XYLOCAINE W/EPI) 2 %-1:200000 (PF) injection (  Not Given 08/08/23 2335)  acetaminophen (TYLENOL) tablet 650 mg (has no administration in time range)    Or  acetaminophen (TYLENOL) suppository 650 mg (has no administration in time range)  melatonin tablet 3 mg (has no administration in time range)  ondansetron (ZOFRAN) injection 4 mg (has no administration in time range)  furosemide (LASIX) injection 40 mg (40 mg Intravenous Given 08/09/23 0735)  amiodarone (PACERONE) tablet 200 mg (200 mg Oral Given 08/09/23 0946)  aspirin EC tablet 81 mg (81 mg Oral Given 08/09/23 0946)  metoprolol succinate (TOPROL-XL) 24 hr tablet 12.5 mg (12.5  mg Oral Not Given 08/09/23 0932)  thyroid (ARMOUR) tablet 15 mg (15 mg Oral Not Given 08/09/23 0736)  prochlorperazine (COMPAZINE) injection 10 mg (has no administration in time range)  enoxaparin (LOVENOX) injection 30 mg (40 mg Subcutaneous Not Given 08/09/23 1222)  ondansetron (ZOFRAN) injection 4 mg (4 mg Intravenous Given 08/08/23 1711)  metoCLOPramide (REGLAN) injection 10 mg (10 mg Intravenous Given 08/08/23 1814)  fentaNYL (SUBLIMAZE) injection 50 mcg (50 mcg Intravenous Given 08/08/23 2008)  lactated ringers bolus 500 mL (0 mLs Intravenous Stopped 08/08/23 2323)    Mobility non-ambulatory     Focused Assessments    R Recommendations: See Admitting Provider Note  Report given to:   Additional Notes:  Pt has her Gleevac 400mg  at bedside and must be locked up and sent to pharmacy (to administer during duration of patient stay & upon discharge home).

## 2023-08-10 DIAGNOSIS — I5033 Acute on chronic diastolic (congestive) heart failure: Secondary | ICD-10-CM | POA: Diagnosis not present

## 2023-08-10 LAB — BASIC METABOLIC PANEL
Anion gap: 6 (ref 5–15)
BUN: 23 mg/dL (ref 8–23)
CO2: 26 mmol/L (ref 22–32)
Calcium: 7.7 mg/dL — ABNORMAL LOW (ref 8.9–10.3)
Chloride: 105 mmol/L (ref 98–111)
Creatinine, Ser: 2.08 mg/dL — ABNORMAL HIGH (ref 0.44–1.00)
GFR, Estimated: 24 mL/min — ABNORMAL LOW (ref >60)
Glucose, Bld: 99 mg/dL (ref 70–99)
Potassium: 4.1 mmol/L (ref 3.5–5.1)
Sodium: 137 mmol/L (ref 135–145)

## 2023-08-10 LAB — HEPATIC FUNCTION PANEL
ALT: 167 U/L — ABNORMAL HIGH (ref 0–44)
AST: 168 U/L — ABNORMAL HIGH (ref 15–41)
Albumin: 2.4 g/dL — ABNORMAL LOW (ref 3.5–5.0)
Alkaline Phosphatase: 78 U/L (ref 38–126)
Bilirubin, Direct: 0.2 mg/dL (ref 0.0–0.2)
Indirect Bilirubin: 0.6 mg/dL (ref 0.3–0.9)
Total Bilirubin: 0.8 mg/dL (ref 0.0–1.2)
Total Protein: 5.2 g/dL — ABNORMAL LOW (ref 6.5–8.1)

## 2023-08-10 LAB — CBC
HCT: 23.3 % — ABNORMAL LOW (ref 36.0–46.0)
Hemoglobin: 7.5 g/dL — ABNORMAL LOW (ref 12.0–15.0)
MCH: 31.8 pg (ref 26.0–34.0)
MCHC: 32.2 g/dL (ref 30.0–36.0)
MCV: 98.7 fL (ref 80.0–100.0)
Platelets: 168 10*3/uL (ref 150–400)
RBC: 2.36 MIL/uL — ABNORMAL LOW (ref 3.87–5.11)
RDW: 15.9 % — ABNORMAL HIGH (ref 11.5–15.5)
WBC: 5.1 10*3/uL (ref 4.0–10.5)
nRBC: 0 % (ref 0.0–0.2)

## 2023-08-10 LAB — CYTOLOGY - NON PAP

## 2023-08-10 MED ORDER — FUROSEMIDE 10 MG/ML IJ SOLN: 60.0000 mg | Freq: Two times a day (BID) | INTRAMUSCULAR | Status: DC

## 2023-08-10 MED ORDER — FUROSEMIDE 10 MG/ML IJ SOLN
60.0000 mg | Freq: Two times a day (BID) | INTRAMUSCULAR | Status: DC
  Administered 2023-08-10: 60 mg via INTRAVENOUS
  Filled 2023-08-10 (×2): qty 6

## 2023-08-10 MED ORDER — IMATINIB MESYLATE 400 MG PO TABS
400.0000 mg | ORAL_TABLET | Freq: Every day | ORAL | Status: DC
  Administered 2023-08-10 – 2023-08-21 (×12): 400 mg via ORAL
  Filled 2023-08-10 (×11): qty 1

## 2023-08-10 NOTE — Plan of Care (Signed)
   Problem: Education: Goal: Knowledge of General Education information will improve Description: Including pain rating scale, medication(s)/side effects and non-pharmacologic comfort measures Outcome: Progressing   Problem: Clinical Measurements: Goal: Will remain free from infection Outcome: Progressing   Problem: Clinical Measurements: Goal: Diagnostic test results will improve Outcome: Progressing

## 2023-08-10 NOTE — TOC Initial Note (Signed)
 Transition of Care [facility]) - Initial/Assessment Note    Patient Details  Name: Lisa [name] MRN: [id number] Date of Birth: 22-Feb-1941  Transition [facility]) CM/SW Contact:    Waddell Barnie Rama, RN Phone Number: 08/10/2023, 2:14 PM  Clinical Narrative:                 From home alone, has PCP  but they can not see her until March since her other PCP left ,has insurance on file, states has no HH services in place at this time , has a cane and a rollator.  She states she will need to have asst with cab voucher to get home at [place] and family is support system,but [place],  states gets medications from [facility], sometimes [facility].SABRA Hackney self ambulatory with cane or rollator when out. She states she would like to do the OP PT at [facility], NCM made referral thru epic.    Expected Discharge Plan: Home/Self Care Barriers to Discharge: Continued Medical Work up   Patient Goals and CMS Choice Patient states their goals for this hospitalization and ongoing recovery are:: return home   Choice offered to / list presented to : NA      Expected Discharge Plan and Services In-house Referral: NA Discharge Planning Services: CM Consult Post Acute Care Choice: NA Living arrangements for the past 2 months: Single Family Home                 DME Arranged: N/A DME Agency: NA       HH Arranged: NA          Prior Living Arrangements/Services Living arrangements for the past 2 months: Single Family Home Lives with:: Self Patient language and need for interpreter reviewed:: Yes Do you feel safe going back to the place where you live?: Yes      Need for Family Participation in Patient Care: Yes (Comment) Care giver support system in place?: Yes (comment) Current home services: DME (cane , rollator) Criminal Activity/Legal Involvement Pertinent to Current Situation/Hospitalization: No - Comment as needed  Activities of Daily Living   ADL Screening  (condition at time of admission) Independently performs ADLs?: Yes (appropriate for developmental age) Is the patient deaf or have difficulty hearing?: No Does the patient have difficulty seeing, even when wearing glasses/contacts?: No Does the patient have difficulty concentrating, remembering, or making decisions?: No  Permission Sought/Granted Permission sought to share information with : Case Manager Permission granted to share information with : Yes, Verbal Permission Granted     Permission granted to share info w AGENCY: OP        Emotional Assessment Appearance:: Appears stated age Attitude/Demeanor/Rapport: Engaged Affect (typically observed): Appropriate Orientation: : Oriented to Self, Oriented to Place, Oriented to  Time, Oriented to Situation Alcohol / Substance Use: Not Applicable Psych Involvement: No (comment)  Admission diagnosis:  Acute on chronic diastolic heart failure (HCC) [I50.33] Pleural effusion [J90] Patient Active Problem List   Diagnosis Date Noted   Acute hypoxic respiratory failure (HCC) 08/09/2023   Fall at home, initial encounter 08/09/2023   Generalized weakness 08/09/2023   Elevated troponin 08/09/2023   Anemia of chronic disease 08/09/2023   Acute on chronic diastolic heart failure (HCC) 08/08/2023   Cellulitis, leg 07/17/2023   Open wound of right lower extremity 07/17/2023   Respiratory distress 07/17/2023   Recurrent left pleural effusion 07/17/2023   Heart failure with reduced ejection fraction (HCC) 07/17/2023  Chronic kidney disease, stage III (moderate) (HCC) 07/17/2023   Obesity (BMI 30-39.9) 07/17/2023   Urine culture positive 06/19/2022   Transaminitis 06/17/2022   Viral gastroenteritis 06/16/2022   Bacteremia due to Pseudomonas 06/16/2022   Venous stasis dermatitis of both lower extremities 05/26/2022   Acute on chronic systolic heart failure (HCC)    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 05/22/2022   Acute renal failure  superimposed on stage 3b chronic kidney disease (HCC) 05/22/2022   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 [address], [place] - [address] [place] [address] Phone: [phone] Fax: [phone]  [id number] [address] [facility] - [address] [place] [address] Phone: [phone] Fax: [phone]  [id number] [address], [place] - [address] AT [facility] OF [facility] & [address] [place] [address] Phone: [phone] Fax: [phone]     Social Drivers of Health (SDOH) Social History: SDOH Screenings   Food Insecurity: No Food Insecurity (08/09/2023)  Housing: Low Risk  (08/09/2023)  Transportation Needs: No Transportation Needs (08/09/2023)  Utilities: Not At Risk (08/09/2023)  Financial Resource Strain: Low Risk  (10/28/2022)   Received from [facility]  Social Connections: Moderately Isolated (08/09/2023)  Tobacco Use: Low Risk  (08/08/2023)   SDOH Interventions:     Readmission Risk Interventions    08/10/2023    2:10 PM 07/19/2023    3:29 PM  Readmission Risk Prevention Plan  Transportation Screening Complete Complete  PCP or Specialist Appt within 5-7 Days  Complete  PCP or Specialist Appt within 3-5 Days Complete   Home Care Screening  Complete  Medication Review (RN CM)  Complete  HRI or Home Care Consult Complete   Palliative Care Screening Not  Applicable   Medication Review (RN Care Manager) Complete

## 2023-08-10 NOTE — Evaluation (Signed)
 Physical Therapy Evaluation Patient Details Name: Lisa [name] MRN: [id number] DOB: 12-Oct-1940 Today's Date: 08/10/2023  History of Present Illness  Pt is an 82 year old woman admitted 10/08/2022 with suspected acute on chronic diastolic heart failure after presenting from home complaining of shortness of breath. PMH includes: + for pseudomonas, CHF exacerbation, L pleural effusion. 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   Clinical Impression  Lisa [name] is 82 y.o. female admitted with above HPI and diagnosis. Patient is currently limited by functional impairments below (see PT problem list). Patient lives alone and is mod ind with SPC in home and rollator out of home for mobility at baseline. Pt currently limited by weakness and fatigue, reports poor sleep last night. Pt able to complete bed mobility at supervision level and transfers with RW at min assist fading to CGA for safety. Pt had successful void of bladder on BSC and able to complete pericare with supervision and single UE support on RW for standing balance. Patient will benefit from continued skilled PT interventions to address impairments and progress independence with mobility. Acute PT will follow and progress as able.         If plan is discharge home, recommend the following: A little help with walking and/or transfers;A little help with bathing/dressing/bathroom;Assistance with cooking/housework;Assist for transportation;Help with stairs or ramp for entrance   Can travel by private vehicle        Equipment Recommendations None recommended by PT  Recommendations for Other Services       Functional Status Assessment Patient has had a recent decline in their functional status and demonstrates the ability to make significant improvements in function in a reasonable and predictable amount of time.     Precautions / Restrictions Precautions Precautions: Fall Precaution Comments:  watch O2, on chemo meds. reports fell OOB when getting tangled in sheets trying to get up, ~ 3days PTA Restrictions Weight Bearing Restrictions Per Provider Order: No      Mobility  Bed Mobility Overal bed mobility: Needs Assistance Bed Mobility: Supine to Sit     Supine to sit: Contact guard, Used rails, HOB elevated     General bed mobility comments: pt reliant on bed features, CGA for safety    Transfers Overall transfer level: Needs assistance Equipment used: Rolling walker (2 wheels) Transfers: Sit to/from Stand, Bed to chair/wheelchair/BSC Sit to Stand: Contact guard assist   Step pivot transfers: Min assist, Contact guard assist       General transfer comment: CGA for rise, pt using bil UE to initiate. Min assist first stand and steps bed>BSC. CGA for second steps BSC>recliner. no buckling or LOB noted. VSS.    Ambulation/Gait                  Stairs            Wheelchair Mobility     Tilt Bed    Modified Rankin (Stroke Patients Only)       Balance Overall balance assessment: Needs assistance Sitting-balance support: Feet unsupported, Bilateral upper extremity supported Sitting balance-Leahy Scale: Fair     Standing balance support: Reliant on assistive device for balance Standing balance-Leahy Scale: Poor Standing balance comment: more stable with rollator                             Pertinent Vitals/Pain Pain Assessment Pain Assessment: No/denies pain Pain Intervention(s): Limited  activity within patient's tolerance, Monitored during session, Repositioned    Home Living Family/patient expects to be discharged to:: Private residence Living Arrangements: Alone Available Help at Discharge: Family;Available PRN/intermittently Type of Home: House Home Access: Stairs to enter Entrance Stairs-Rails: Right Entrance Stairs-Number of Steps: 3   Home Layout: Two level;Able to live on main level with bedroom/bathroom Home  Equipment: Rollator (4 wheels);Shower seat;Cane - single point      Prior Function Prior Level of Function : Independent/Modified Independent;Driving             Mobility Comments: Amb with SPC in house, rollator out of house ADLs Comments: Drives, ind, has shower chair with no back. Likes to read, cook, go to restaurants. Was managing her LE dressings (lymphedema wrapping)     Extremity/Trunk Assessment   Upper Extremity Assessment Upper Extremity Assessment: Defer to OT evaluation    Lower Extremity Assessment Lower Extremity Assessment: Generalized weakness (chronic lymphedema)    Cervical / Trunk Assessment Cervical / Trunk Assessment: Normal;Other exceptions Cervical / Trunk Exceptions: chronic lymphedema, body habitus, chronic O2  Communication   Communication Communication: No apparent difficulties  Cognition Arousal: Alert Behavior During Therapy: Flat affect, WFL for tasks assessed/performed Overall Cognitive Status: Within Functional Limits for tasks assessed                                          General Comments      Exercises     Assessment/Plan    PT Assessment Patient needs continued PT services  PT Problem List Decreased strength;Decreased range of motion;Decreased activity tolerance;Decreased balance;Decreased mobility;Cardiopulmonary status limiting activity;Obesity       PT Treatment Interventions DME instruction;Gait training;Stair training;Functional mobility training;Therapeutic activities;Therapeutic exercise;Balance training;Neuromuscular re-education;Patient/family education    PT Goals (Current goals can be found in the Care Plan section)  Acute Rehab PT Goals Patient Stated Goal: get well go home PT Goal Formulation: With patient Time For Goal Achievement: 08/24/23 Potential to Achieve Goals: Good    Frequency Min 1X/week     Co-evaluation               AM-PAC PT 6 Clicks Mobility  Outcome Measure  Help needed turning from your back to your side while in a flat bed without using bedrails?: None Help needed moving from lying on your back to sitting on the side of a flat bed without using bedrails?: None Help needed moving to and from a bed to a chair (including a wheelchair)?: A Little Help needed standing up from a chair using your arms (e.g., wheelchair or bedside chair)?: A Little Help needed to walk in hospital room?: A Little Help needed climbing 3-5 steps with a railing? : A Lot 6 Click Score: 19    End of Session Equipment Utilized During Treatment: Gait belt Activity Tolerance: Patient tolerated treatment well Patient left: in chair;with call bell/phone within reach Nurse Communication: Mobility status PT Visit Diagnosis: Difficulty in walking, not elsewhere classified (R26.2);Muscle weakness (generalized) (M62.81);Unsteadiness on feet (R26.81)    Time: 8866-8795 PT Time Calculation (min) (ACUTE ONLY): 31 min   Charges:   PT Evaluation $PT Eval Moderate Complexity: 1 Mod PT Treatments $Therapeutic Activity: 8-22 mins PT General Charges $$ ACUTE PT VISIT: 1 Visit         Vernell DONEEN KLEIN, DPT Acute Rehabilitation Services Office [phone]  08/10/23 12:24 PM

## 2023-08-10 NOTE — Progress Notes (Addendum)
 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 -Back in the ED 12/29, reported 2 to 3 days of progressive dyspnea on exertion with cough orthopnea and nausea vomiting. -In the ED hypertensive, hypoxic, underwent left thoracentesis by EDP, 1.1 mL of pleural fluid drained, labs noted creatinine 2.0, sodium 135, mildly elevated LFTs, BNP 804, troponin 39, 46, WBC 8.6, hemoglobin 9.8.  CT chest abdomen pelvis demonstrated large left pleural effusion, moderate right pleural effusion, small ascites   Subjective: -Still weak and short of breath, breathing better after thoracentesis  Assessment and Plan:  Acute on chronic diastolic CHF Recurrent pleural effusions left> right -Recent echo 12/24 with EF 60-65%, normal RV, moderate pericardial effusion -Recent admission for same, supposed to be on torsemide  20 Mg daily with poor compliance,  takes this few times a week only becoz it doesn't work much -Recent L thoracentesis 12/10, 900 mL of transudative fluid drained, cytology negative -Repeat L thoracentesis in ED 12/30, 1.1 L drained, again transudative, despite pleural fluid LDH being high, serum LDH is considerably elevated from CML -Urine output not accurate, increase Lasix  to 40 Mg twice daily, continue Toprol  -GDMT limited by CKD -Increase activity, PT OT eval  Acute hypoxic respiratory failure -Secondary to diastolic CHF, pleural effusions left> right -As above, wean O2  History of chronic nausea and vomiting -I suspect this is multifactorial, very common side effect of Gleevec ,  also suspect a posttussive component to her vomiting -Supportive care  Mechanical fall, generalized weakness -Likely secondary to dyspnea and hypoxia -Imaging unremarkable for injuries, PT OT consult  AKI on CKD 3B,  -baseline creatinine around 1.6-2 -Relatively stable, monitor with diuresis  Abnormal LFTs -Could be related to passive hepatic congestion, Gleevac  History of ventricular tachycardia On amiodarone   History of CML Acute on chronic anemia -Maintained on Gleevec ,  -Anemia panel suggestive of chronic disease  Hypothyroidism Continue Synthroid  DVT prophylaxis: add lovenox  Code Status: Full Code Family Communication: None present Disposition Plan: TBD  Consultants:    Procedures:   Antimicrobials:    Objective: Vitals:   08/09/23 1930 08/09/23 2325 08/10/23 0530 08/10/23 0821  BP: (!) 121/51 (!) 121/44 (!) 122/48 (!) 102/42  Pulse: 70 70 77 73  Resp: 19 20 20  (!) 24  Temp: 97.9 F (36.6 C) 98.3 F (36.8 C) 98.2 F (36.8 C) 98.1 F (36.7 C)  TempSrc: Oral Oral Oral Oral  SpO2: 100% 97% 94% 97%  Weight:      Height:        Intake/Output Summary (Last 24 hours) at 08/10/2023 1152 Last data filed at 08/10/2023 0700 Gross per 24 hour  Intake 290 ml  Output 300 ml  Net -10 ml   Filed Weights   08/09/23 1831  Weight: 96.2 kg    Examination:  General exam: Obese chronically ill female sitting up in bed, AAOx3 HEENT: Positive JVD CVS: S1-S2, regular rhythm Lungs: Few basilar Rales Abdomen: Soft, nontender, bowel sounds present  Extremities: 2+ edema, chronic skin changes and deformities Psychiatry:  Mood & affect appropriate.     Data Reviewed:   CBC: Recent Labs  Lab 08/08/23 1645 08/08/23 1708 08/08/23 1709 08/09/23 9572  08/10/23 0338  WBC 8.6  --   --  6.9 5.1  NEUTROABS 5.8  --   --  5.5  --   HGB 9.8* 10.9* 11.2* 8.1* 7.5*  HCT 32.1* 32.0* 33.0* 25.8* 23.3*  MCV 104.6*  --   --  100.8* 98.7  PLT 223  --   --  158 168    Basic Metabolic Panel: Recent Labs  Lab 08/08/23 1645 08/08/23 1708 08/08/23 1709 08/08/23 1927 08/09/23 0427 08/10/23 0338  NA 135 139 138  --  139 137  K 4.3 4.4 4.4  --  4.3 4.1  CL 101 103  --   --  105 105  CO2 23  --   --   --  26 26  GLUCOSE 149* 145*  --   --  113* 99  BUN 22 24*  --   --  23 23  CREATININE 2.06* 2.10*  --   --  2.05* 2.08*  CALCIUM  8.4*  --   --   --  8.0* 7.7*  MG  --   --   --  2.0 1.8  --   PHOS  --   --   --   --  3.8  --    GFR: Estimated Creatinine Clearance: 22.5 mL/min (A) (by C-G formula based on SCr of 2.08 mg/dL (H)). Liver Function Tests: Recent Labs  Lab 08/08/23 1645 08/09/23 0427  AST 310* 318*  ALT 194* 204*  ALKPHOS 87 66  BILITOT 1.4* 1.2*  PROT 6.8 5.3*  ALBUMIN  3.3* 2.6*   No results for input(s): LIPASE, AMYLASE in the last 168 hours. No results for input(s): AMMONIA in the last 168 hours. Coagulation Profile: Recent Labs  Lab 08/09/23 0427  INR 1.5*   Cardiac Enzymes: No results for input(s): CKTOTAL, CKMB, CKMBINDEX, TROPONINI in the last 168 hours. BNP (last 3 results) No results for input(s): PROBNP in the last 8760 hours. HbA1C: No results for input(s): HGBA1C in the last 72 hours. CBG: No results for input(s): GLUCAP in the last 168 hours. Lipid Profile: No results for input(s): CHOL, HDL, LDLCALC, TRIG, CHOLHDL, LDLDIRECT in the last 72 hours. Thyroid  Function Tests: Recent Labs    08/08/23 1927  TSH 2.287   Anemia Panel: Recent Labs    08/09/23 0427 08/09/23 0500 08/09/23 1531  VITAMINB12 1,167*  --  1,119*  FOLATE  --   --  10.8  FERRITIN  --   --  675*  TIBC  --   --  214*  IRON  --   --  62  RETICCTPCT  --  3.0  --    Urine analysis:    Component Value Date/Time   COLORURINE YELLOW 08/09/2023 0742   APPEARANCEUR CLEAR 08/09/2023 0742   LABSPEC 1.009 08/09/2023 0742   PHURINE 5.0 08/09/2023 0742   GLUCOSEU NEGATIVE 08/09/2023 0742   HGBUR NEGATIVE  08/09/2023 0742   BILIRUBINUR NEGATIVE 08/09/2023 0742   BILIRUBINUR negative 06/04/2016 0847   BILIRUBINUR neg 02/14/2015 1431   KETONESUR NEGATIVE 08/09/2023 0742   PROTEINUR NEGATIVE 08/09/2023 0742   UROBILINOGEN 0.2 06/04/2016 0847   NITRITE NEGATIVE 08/09/2023 0742   LEUKOCYTESUR TRACE (A) 08/09/2023 0742   Sepsis Labs: @LABRCNTIP (procalcitonin:4,lacticidven:4)  ) Recent Results (from the past 240 hours)  Body fluid culture w Gram Stain     Status: None (Preliminary result)   Collection Time: 08/08/23  9:34 PM   Specimen: Pleural Fluid  Result Value Ref Range Status   Specimen Description  FLUID PLEURAL  Final   Special Requests NONE  Final   Gram Stain NO WBC SEEN NO ORGANISMS SEEN   Final   Culture   Final    NO GROWTH 2 DAYS Performed at [facility], [address] [place], [place] [address]    Report Status PENDING  Incomplete  Blood culture (routine x 2)     Status: None (Preliminary result)   Collection Time: 08/08/23  9:36 PM   Specimen: BLOOD RIGHT ARM  Result Value Ref Range Status   Specimen Description BLOOD RIGHT ARM  Final   Special Requests   Final    BOTTLES DRAWN AEROBIC AND ANAEROBIC Blood Culture adequate volume   Culture   Final    NO GROWTH 2 DAYS Performed at [facility], [address] [place], [place] [address]    Report Status PENDING  Incomplete  Blood culture (routine x 2)     Status: None (Preliminary result)   Collection Time: 08/08/23  9:36 PM   Specimen: BLOOD LEFT HAND  Result Value Ref Range Status   Specimen Description BLOOD LEFT HAND  Final   Special Requests   Final    BOTTLES DRAWN AEROBIC AND ANAEROBIC Blood Culture adequate volume   Culture   Final    NO GROWTH 2 DAYS Performed at [facility], [address], [place], [place] [address]    Report Status PENDING  Incomplete     Radiology Studies: ECHOCARDIOGRAM COMPLETE Result Date: 08/09/2023    ECHOCARDIOGRAM REPORT   Patient Name:    KENASIA SCHELLER Date of Exam: 08/09/2023 Medical Rec #:  [id number]          Height:       62.0 in Accession #:    [id number]         Weight:       206.2 lb Date of Birth:  [birth year]           BSA:          1.937 m Patient Age:    82 years           BP:           124/48 mmHg Patient Gender: F                  HR:           67 bpm. Exam Location:  Inpatient Procedure: 2D Echo, Cardiac Doppler and Color Doppler Indications:    CHF  History:        Patient has prior history of Echocardiogram examinations, most                 recent 07/18/2023. CHF, Signs/Symptoms:Chest Pain; Risk                 Factors:Hypertension.  Sonographer:    Lanell Maduro Referring Phys: [id number] JUSTIN B HOWERTER IMPRESSIONS  1. Left ventricular ejection fraction, by estimation, is 55 to 60%. The left ventricle has normal function. The left ventricle has no regional wall motion abnormalities. Left ventricular diastolic parameters are consistent with Grade I diastolic dysfunction (impaired relaxation).  2. Right ventricular systolic function is normal. The right ventricular size is normal.  3. Large pleural effusion.  4. The mitral valve is normal in structure. No evidence of mitral valve regurgitation. No evidence of mitral stenosis.  5. The aortic valve is normal in structure. Aortic valve regurgitation is not visualized. No aortic stenosis  is present.  6. The inferior vena cava is dilated in size with <50% respiratory variability, suggesting right atrial pressure of 15 mmHg. FINDINGS  Left Ventricle: Left ventricular ejection fraction, by estimation, is 55 to 60%. The left ventricle has normal function. The left ventricle has no regional wall motion abnormalities. The left ventricular internal cavity size was normal in size. There is  no left ventricular hypertrophy. Left ventricular diastolic parameters are consistent with Grade I diastolic dysfunction (impaired relaxation). Indeterminate filling pressures. Right Ventricle: The right  ventricular size is normal. No increase in right ventricular wall thickness. Right ventricular systolic function is normal. Left Atrium: Left atrial size was normal in size. Right Atrium: Right atrial size was normal in size. Pericardium: There is no evidence of pericardial effusion. Mitral Valve: The mitral valve is normal in structure. No evidence of mitral valve regurgitation. No evidence of mitral valve stenosis. Tricuspid Valve: The tricuspid valve is normal in structure. Tricuspid valve regurgitation is not demonstrated. No evidence of tricuspid stenosis. Aortic Valve: The aortic valve is normal in structure. Aortic valve regurgitation is not visualized. No aortic stenosis is present. Pulmonic Valve: The pulmonic valve was normal in structure. Pulmonic valve regurgitation is not visualized. No evidence of pulmonic stenosis. Aorta: The aortic root is normal in size and structure. Venous: The inferior vena cava is dilated in size with less than 50% respiratory variability, suggesting right atrial pressure of 15 mmHg. IAS/Shunts: No atrial level shunt detected by color flow Doppler. Additional Comments: There is a large pleural effusion.  LEFT VENTRICLE PLAX 2D LVIDd:         4.80 cm      Diastology LVIDs:         4.30 cm      LV e' medial:    6.85 cm/s LV PW:         0.70 cm      LV E/e' medial:  10.8 LV IVS:        0.60 cm      LV e' lateral:   6.31 cm/s LVOT diam:     1.50 cm      LV E/e' lateral: 11.7 LV SV:         53 LV SV Index:   27 LVOT Area:     1.77 cm  LV Volumes (MOD) LV vol d, MOD A2C: 129.0 ml LV vol d, MOD A4C: 81.3 ml LV vol s, MOD A2C: 46.3 ml LV vol s, MOD A4C: 36.1 ml LV SV MOD A2C:     82.7 ml LV SV MOD A4C:     81.3 ml LV SV MOD BP:      63.6 ml RIGHT VENTRICLE             IVC RV Basal diam:  2.40 cm     IVC diam: 2.30 cm RV S prime:     13.80 cm/s TAPSE (M-mode): 3.9 cm LEFT ATRIUM             Index        RIGHT ATRIUM           Index LA diam:        4.60 cm 2.38 cm/m   RA Area:     12.90  cm LA Vol (A2C):   56.0 ml 28.92 ml/m  RA Volume:   23.40 ml  12.08 ml/m LA Vol (A4C):   50.8 ml 26.23 ml/m LA Biplane Vol: 56.1 ml 28.97 ml/m  AORTIC VALVE LVOT  Vmax:   138.00 cm/s LVOT Vmean:  95.000 cm/s LVOT VTI:    0.299 m  AORTA Ao Asc diam: 3.40 cm MITRAL VALVE MV Area (PHT): 3.86 cm    SHUNTS MV E velocity: 73.70 cm/s  Systemic VTI:  0.30 m MV A velocity: 86.60 cm/s  Systemic Diam: 1.50 cm MV E/A ratio:  0.85 Annabella Scarce MD Electronically signed by Annabella Scarce MD Signature Date/Time: 08/09/2023/3:33:30 PM    Final    DG Chest Portable 1 View Result Date: 08/08/2023 CLINICAL DATA:  Shortness of breath.  Status post thoracentesis. EXAM: PORTABLE CHEST 1 VIEW COMPARISON:  Chest radiograph dated 08/08/2023 at 5:34 p.m. FINDINGS: Lines/tubes: Left chest wall ICD leads project over the right atrium and ventricle and tributary of the coronary sinus. Lungs: Low lung volumes. Bilateral interstitial opacities. Bibasilar patchy opacities. Pleura: Small right and decreased small left pleural effusions. No pneumothorax. Heart/mediastinum: Similar enlarged cardiomediastinal silhouette. Bones: No acute osseous abnormality. IMPRESSION: 1. Small right and decreased small left pleural effusions. No pneumothorax. 2. Bilateral interstitial opacities and bibasilar patchy opacities, likely pulmonary edema. Electronically Signed   By: Limin  Xu M.D.   On: 08/08/2023 21:12   CT CHEST ABDOMEN PELVIS WO CONTRAST Result Date: 08/08/2023 CLINICAL DATA:  Sepsis.  Recent fall. EXAM: CT CHEST, ABDOMEN AND PELVIS WITHOUT CONTRAST TECHNIQUE: Multidetector CT imaging of the chest, abdomen and pelvis was performed following the standard protocol without IV contrast. RADIATION DOSE REDUCTION: This exam was performed according to the departmental dose-optimization program which includes automated exposure control, adjustment of the mA and/or kV according to patient size and/or use of iterative reconstruction technique.  COMPARISON:  Radiograph earlier today. 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 Aortic atherosclerosis without aneurysm. The heart is mildly enlarged. No pericardial effusion. Mediastinum/Nodes: Lack of contrast limits hilar assessment. No gross mediastinal adenopathy. No mediastinal hemorrhage. Decompressed esophagus, no pneumomediastinum. Subcentimeter thyroid  nodules. Not clinically significant; no follow-up imaging recommended (ref: J Am Coll Radiol. 2015 Feb;12(2): 143-50). Lungs/Pleura: Large left and moderate right pleural effusion, simple fluid density. Complete collapse/compressive atelectasis of the left lower lobe with subtotal atelectasis of the anterior left upper lobe. Only small portion of left upper lobe is aerated in the hemithorax. No evidence of endobronchial or tracheal debris or obstructing lesion. Compressive atelectasis in the dependent right lung. Perifissural atelectasis in the right upper and middle lobes. No evidence of pulmonary mass. Musculoskeletal: There are no acute or suspicious osseous abnormalities. Age related degenerative change in the spine. Bilateral breast implants which are partially calcified. CT ABDOMEN PELVIS FINDINGS Hepatobiliary: Subtle capsular nodularity of the liver suspicious for cirrhosis. 8 mm hypodensity in the subcapsular right lobe of the liver is unchanged from prior exam and likely cyst. Layering sludge or stones in the gallbladder. No pericholecystic inflammation. No biliary dilatation. No evidence of hepatic injury. Pancreas: Unremarkable unenhanced appearance. Spleen: Unremarkable unenhanced appearance. 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 No further follow-up imaging is recommended. Normal right adrenal gland. No evidence of adrenal hemorrhage or injury. Bilateral renal parenchymal thinning without hydronephrosis or focal renal abnormality on this  unenhanced exam. Stomach/Bowel: Bowel assessment is limited in the absence of contrast, arms down positioning and motion. The stomach is decompressed. No evidence of bowel obstruction or inflammation. Small volume of stool in the colon. Vascular/Lymphatic: Advanced aortic and branch atherosclerosis. No aortic aneurysm. No retroperitoneal fluid or perivascular haziness to suggest injury. Scattered small retroperitoneal and periportal nodes are  typically reactive. No suspicious adenopathy. Reproductive: Status post hysterectomy. No adnexal masses. Other: Small volume simple ascites in the abdomen and pelvis. No evidence of hemorrhage. No free air. Mild generalized body wall edema. Musculoskeletal: There are no acute or suspicious osseous abnormalities. Moderate degenerative change in the lumbar spine. IMPRESSION: 1. Large left pleural effusion with subtotal collapse/atelectasis of the left lung. Only small portion of the left upper lobe is aerated. Moderate-sized right pleural effusion with atelectasis throughout the right lung. 2. Small volume simple ascites in the abdomen and pelvis. Mild generalized body wall edema. 3. Subtle capsular nodularity of the liver suspicious for cirrhosis. 4. Cholelithiasis without evidence of cholecystitis. Aortic Atherosclerosis (ICD10-I70.0). Electronically Signed   By: Andrea Gasman M.D.   On: 08/08/2023 19:24   CT HEAD WO CONTRAST ( ) Result Date: 08/08/2023 CLINICAL DATA:  Trauma EXAM: CT HEAD WITHOUT CONTRAST CT MAXILLOFACIAL WITHOUT CONTRAST CT CERVICAL SPINE WITHOUT CONTRAST TECHNIQUE: Multidetector CT imaging of the head, cervical spine, and maxillofacial structures were performed using the standard protocol without intravenous contrast. Multiplanar CT image reconstructions of the cervical spine and maxillofacial structures were also generated. RADIATION DOSE REDUCTION: This exam was performed according to the departmental dose-optimization program which includes  automated exposure control, adjustment of the mA and/or kV according to patient size and/or use of iterative reconstruction technique. COMPARISON:  05/01/2020 FINDINGS: CT HEAD FINDINGS Brain: There is no mass, hemorrhage or extra-axial collection. The size and configuration of the ventricles and extra-axial CSF spaces are normal. There is hypoattenuation of the white matter, most commonly indicating chronic small vessel disease. Vascular: Atherosclerotic calcification of the internal carotid arteries at the skull base. No abnormal hyperdensity of the major intracranial arteries or dural venous sinuses. Skull: The visualized skull base, calvarium and extracranial soft tissues are normal. Sinuses/Orbits: No fluid levels or advanced mucosal thickening of the visualized paranasal sinuses. No mastoid or middle ear effusion. Normal orbits. Other: None. CT MAXILLOFACIAL FINDINGS Osseous: No facial fracture or mandibular dislocation. Orbits: The globes are intact. Normal appearance of the intra- and extraconal fat. Symmetric extraocular muscles and optic nerves. Sinuses: No fluid levels or advanced mucosal thickening. Soft tissues: Normal visualized extracranial soft tissues. CT CERVICAL SPINE FINDINGS Alignment: No static subluxation. Facets are aligned. Occipital condyles and the lateral masses of C1-C2 are aligned. Skull base and vertebrae: No acute fracture. Soft tissues and spinal canal: No prevertebral fluid or swelling. No visible canal hematoma. Disc levels: No advanced spinal canal or neural foraminal stenosis. Upper chest: Fluid filling the left hemithorax. Small right pleural effusion, incompletely visualized. Other: 1.8 cm left parotid mass. IMPRESSION: 1. No acute intracranial abnormality. 2. No facial fracture. 3. No acute fracture or static subluxation of the cervical spine. 4. Fluid filling the left hemithorax. Small right pleural effusion, incompletely visualized. 5. 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 Due to the  overlap of the imaging features of benign and malignant parotid masses, histologic sampling is recommended. Electronically Signed   By: Franky Stanford M.D.   On: 08/08/2023 19:19   CT Maxillofacial Wo Contrast Result Date: 08/08/2023 CLINICAL DATA:  Trauma EXAM: CT HEAD WITHOUT CONTRAST CT MAXILLOFACIAL WITHOUT CONTRAST CT CERVICAL SPINE WITHOUT CONTRAST TECHNIQUE: Multidetector CT imaging of the head, cervical spine, and maxillofacial structures were performed using the standard protocol without intravenous contrast. Multiplanar CT image reconstructions of the cervical spine and maxillofacial structures were also generated. RADIATION DOSE REDUCTION: This exam was performed according to the departmental dose-optimization program which includes automated exposure control, adjustment  of the mA and/or kV according to patient size and/or use of iterative reconstruction technique. COMPARISON:  05/01/2020 FINDINGS: CT HEAD FINDINGS Brain: There is no mass, hemorrhage or extra-axial collection. The size and configuration of the ventricles and extra-axial CSF spaces are normal. There is hypoattenuation of the white matter, most commonly indicating chronic small vessel disease. Vascular: Atherosclerotic calcification of the internal carotid arteries at the skull base. No abnormal hyperdensity of the major intracranial arteries or dural venous sinuses. Skull: The visualized skull base, calvarium and extracranial soft tissues are normal. Sinuses/Orbits: No fluid levels or advanced mucosal thickening of the visualized paranasal sinuses. No mastoid or middle ear effusion. Normal orbits. Other: None. CT MAXILLOFACIAL FINDINGS Osseous: No facial fracture or mandibular dislocation. Orbits: The globes are intact. Normal appearance of the intra- and extraconal fat. Symmetric extraocular muscles and optic nerves. Sinuses: No fluid levels or advanced mucosal thickening. Soft tissues: Normal visualized extracranial soft tissues. CT  CERVICAL SPINE FINDINGS Alignment: No static subluxation. Facets are aligned. Occipital condyles and the lateral masses of C1-C2 are aligned. Skull base and vertebrae: No acute fracture. Soft tissues and spinal canal: No prevertebral fluid or swelling. No visible canal hematoma. Disc levels: No advanced spinal canal or neural foraminal stenosis. Upper chest: Fluid filling the left hemithorax. Small right pleural effusion, incompletely visualized. Other: 1.8 cm left parotid mass. IMPRESSION: 1. No acute intracranial abnormality. 2. No facial fracture. 3. No acute fracture or static subluxation of the cervical spine. 4. Fluid filling the left hemithorax. Small right pleural effusion, incompletely visualized. 5. 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 Due to the overlap of the imaging features of benign and malignant parotid masses, histologic sampling is recommended. Electronically Signed   By: Franky Stanford M.D.   On: 08/08/2023 19:19   CT Cervical Spine Wo Contrast Result Date: 08/08/2023 CLINICAL DATA:  Trauma EXAM: CT HEAD WITHOUT CONTRAST CT MAXILLOFACIAL WITHOUT CONTRAST CT CERVICAL SPINE WITHOUT CONTRAST TECHNIQUE: Multidetector CT imaging of the head, cervical spine, and maxillofacial structures were performed using the standard protocol without intravenous contrast. Multiplanar CT image reconstructions of the cervical spine and maxillofacial structures were also generated. RADIATION DOSE REDUCTION: This exam was performed according to the departmental dose-optimization program which includes automated exposure control, adjustment of the mA and/or kV according to patient size and/or use of iterative reconstruction technique. COMPARISON:  05/01/2020 FINDINGS: CT HEAD FINDINGS Brain: There is no mass, hemorrhage or extra-axial collection. The size and configuration of the ventricles and extra-axial CSF spaces are normal. There is hypoattenuation of the white matter, most commonly indicating chronic small vessel disease.  Vascular: Atherosclerotic calcification of the internal carotid arteries at the skull base. No abnormal hyperdensity of the major intracranial arteries or dural venous sinuses. Skull: The visualized skull base, calvarium and extracranial soft tissues are normal. Sinuses/Orbits: No fluid levels or advanced mucosal thickening of the visualized paranasal sinuses. No mastoid or middle ear effusion. Normal orbits. Other: None. CT MAXILLOFACIAL FINDINGS Osseous: No facial fracture or mandibular dislocation. Orbits: The globes are intact. Normal appearance of the intra- and extraconal fat. Symmetric extraocular muscles and optic nerves. Sinuses: No fluid levels or advanced mucosal thickening. Soft tissues: Normal visualized extracranial soft tissues. CT CERVICAL SPINE FINDINGS Alignment: No static subluxation. Facets are aligned. Occipital condyles and the lateral masses of C1-C2 are aligned. Skull base and vertebrae: No acute fracture. Soft tissues and spinal canal: No prevertebral fluid or swelling. No visible canal hematoma. Disc levels: No advanced spinal canal or neural foraminal  stenosis. Upper chest: Fluid filling the left hemithorax. Small right pleural effusion, incompletely visualized. Other: 1.8 cm left parotid mass. IMPRESSION: 1. No acute intracranial abnormality. 2. No facial fracture. 3. No acute fracture or static subluxation of the cervical spine. 4. Fluid filling the left hemithorax. Small right pleural effusion, incompletely visualized. 5. 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 Due to the overlap of the imaging features of benign and malignant parotid masses, histologic sampling is recommended. Electronically Signed   By: Franky Stanford M.D.   On: 08/08/2023 19:19   DG Hand 2 View Left Result Date: 08/08/2023 CLINICAL DATA:  Recent fall with left hand pain, initial encounter EXAM: LEFT HAND - 2 VIEW COMPARISON:  None Available. FINDINGS: There is no evidence of fracture or dislocation. There is no evidence of  arthropathy or other focal bone abnormality. Soft tissues are unremarkable. IMPRESSION: No acute abnormality noted. Electronically Signed   By: Oneil Devonshire M.D.   On: 08/08/2023 18:12   DG Hand 2 View Right Result Date: 08/08/2023 CLINICAL DATA:  Recent fall with right hand pain, initial encounter EXAM: RIGHT HAND - 2 VIEW COMPARISON:  None Available. FINDINGS: There is no evidence of fracture or dislocation. There is no evidence of arthropathy or other focal bone abnormality. Soft tissues are unremarkable. IMPRESSION: No acute abnormality noted. Electronically Signed   By: Oneil Devonshire M.D.   On: 08/08/2023 18:12   DG Chest Port 1 View Result Date: 08/08/2023 CLINICAL DATA:  Recent fall with chest pain, initial encounter EXAM: PORTABLE CHEST 1 VIEW COMPARISON:  07/20/2023 FINDINGS: Cardiac shadow is enlarged. Defibrillator is again noted and stable. Aortic calcifications are seen. Patient rotation to the right is noted accentuating the mediastinal markings. The lungs are well aerated. Small left pleural effusion remains. No new focal abnormality is noted. IMPRESSION: No significant change from the prior exam. Electronically Signed   By: Oneil Devonshire M.D.   On: 08/08/2023 18:04     Scheduled Meds:  amiodarone   200 mg Oral Daily   aspirin  EC  81 mg Oral Daily   enoxaparin  (LOVENOX ) injection  30 mg Subcutaneous Q24H   furosemide   40 mg Intravenous BID   imatinib   400 mg Oral Daily   metoprolol  succinate  12.5 mg Oral Daily   thyroid   15 mg Oral QAC breakfast   timolol   1 drop Left Eye BID   Continuous Infusions:   LOS: 2 days    Time spent:    Sigurd Pac, MD [facility]   08/10/2023, 11:52 AM

## 2023-08-10 NOTE — Progress Notes (Addendum)
 Patient Name: Lisa [name] Date of Encounter: 08/10/2023 [facility] Cardiologist: Vina Gull, MD   Interval Summary  .    82 y.o. female with a hx of HTN, CAD s/p DES to LAD '17, HFrEF with improved LVEF from 25-30% to 60-65%,  NICM s/p CRT-D, CML, LBBB, chronic anemia, who is admitted for acute hypoxia 2/2 decompensated CHF.   Patient states she originally came to the hospital due to GI illness such as nausea vomiting and diarrhea, which all have resolved.  She noted 1 month onset of progressive shortness of breath with exertion, endorse chronic bilateral lower extremity lymphedema.  She felt her breathing is much improved today, denied any chest pain.  She admits that she has been skipping torsemide  intermittently prior to admission, denied large amount of salt consumption, states she has been drinking more Coke for the past month.  She felt her urinary output has not changed much since admission despite taking IV Lasix .    Vital Signs .    Vitals:   08/09/23 1930 08/09/23 2325 08/10/23 0530 08/10/23 0821  BP: (!) 121/51 (!) 121/44 (!) 122/48 (!) 102/42  Pulse: 70 70 77 73  Resp: 19 20 20  (!) 24  Temp: 97.9 F (36.6 C) 98.3 F (36.8 C) 98.2 F (36.8 C) 98.1 F (36.7 C)  TempSrc: Oral Oral Oral Oral  SpO2: 100% 97% 94% 97%  Weight:      Height:        Intake/Output Summary (Last 24 hours) at 08/10/2023 1357 Last data filed at 08/10/2023 0700 Gross per 24 hour  Intake 290 ml  Output 300 ml  Net -10 ml      08/09/2023    6:31 PM 07/27/2023    8:19 AM 07/21/2023    4:03 AM  Last 3 Weights  Weight (lbs) 212 lb 1.3 oz 206 lb 3.2 oz 209 lb 7 oz  Weight (kg) 96.2 kg 93.532 kg 95 kg      Telemetry/ECG    Paced rhythm- Personally Reviewed  Physical Exam .   GEN: No acute distress.  Facial bruise noted Neck: Elevated JVD upper neck Cardiac: RRR, no murmurs, rubs, or gallops.  Respiratory: Clear to auscultation bilaterally.  On 2 L nasal cannula  oxygen GI: Soft, nontender, non-distended  MS: Chronic lymphedema with associated skin changes  Assessment & Plan .     Acute HFpEF  Chronic HFrEF with improved LVEF  Mixed etiology cardiomyopathy  - LVEF down to 25-30% in 2017, s/p LAD stent, LVEF improved to 60-65% by 04/2016, LVEF fluctuates between 45% to 60% over the years on Echo, etiology likely non-ischemic/mixed, last LHC 09/2019 non-obstructive CAD and patent LAD stent  - Echo yesterday with LVEF 55-60%, no RWMA, grade I DD, normal RV, large plueral effusion, IVC dilated.  - presented with GI symptoms, recent fall, found hypoxic, BNP 804, Hs trop 39 >46 >53 , CT showed large to moderate bilateral pleural effusion L>R, small ascites, mild body wall edema, suspected cirrhosis - s/p L thoracentesis at ER with output, transudative, cytology pending, last cytology 12/10 was negative for malignancy, possible due to CHF +/- cirrhosis  -Stopped taking torsemide  at home,  s/p IV Lasix , dosing increased to 60mg  BID yesterday, UOP and weight not accurately documented, she reports no significant change of urinary frequency, clinical exam is difficult given obesity and chronic edema, OptoVol has been stable from last transmission sent on Christmas,  asked [employer] representative to interrogate ICD for OptiVol today,  trend is stable does not suggestive of volume overload, given persistent rising of renal index, will stop IV Lasix  60mg  BID today, trend BMP tomorrow  - MD revision: will continue lasix  today given patient improvement in symptoms. Will consider RHC on 1/2 if renal function worsens further.  - May need right heart cath for further evaluation - GDMT: continue Toprol  XL 12.5mg  daily, can trial SGLT2I  outpatient   CRT-D in situ History of VT/VF -Most recent device interrogation from 08/02/2023 revealed normal function, ERI 45-month - on PTA amiodarone    Transaminitis -Repeat LFT today, may need hold amiodarone  if worsening  -CT  concerning for cirrhosis -Defer further workup to medical team  AKI on CKD stage III -Creatinine 1.37 on 07/17/2023, slowly rising to 2.08 over the past month, baseline appears 1.4-1.6 -UA did not suggest significant infection -CT torso 12/29 revealing no hydronephrosis -Will reassess volume status today to determine the dose of diuretic  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 Cirrhosis  - per primary team    For questions or updates, please contact [facility] Please consult [email or web address] for contact info under        Signed, Xika Zhao, NP   Patient seen and examined with [facility] NP.  Agree as above, with the following exceptions and changes as noted below. Pt feels better appears brighter. Gen: NAD, CV: RRR, no murmurs, Lungs: clear, Abd: soft, Extrem: Warm,3+ edema chronic Neuro/Psych: alert and oriented x 3, normal mood and affect. All available labs, radiology testing, previous records reviewed. Pt symptomatically improving after inconsistent diuretic use at home. Reaccumulating pleural effusion. OK to continue lasix  today and monitor renal function for tomorrow. Very challenging volume exam, will consider RHC, if renal fxn worsens tomorrow can hold lasix  and plan for RHC 1/2. Pt not enthusiastic but we discussed rationale, needs formal consent still.   Koah Chisenhall A Shandon Matson, MD 08/10/23 4:54 PM

## 2023-08-11 ENCOUNTER — Inpatient Hospital Stay (HOSPITAL_COMMUNITY): Payer: Medicare Other

## 2023-08-11 DIAGNOSIS — N1832 Chronic kidney disease, stage 3b: Secondary | ICD-10-CM | POA: Diagnosis not present

## 2023-08-11 DIAGNOSIS — I5033 Acute on chronic diastolic (congestive) heart failure: Secondary | ICD-10-CM | POA: Diagnosis not present

## 2023-08-11 DIAGNOSIS — N179 Acute kidney failure, unspecified: Secondary | ICD-10-CM | POA: Diagnosis not present

## 2023-08-11 LAB — BASIC METABOLIC PANEL
Anion gap: 7 (ref 5–15)
BUN: 24 mg/dL — ABNORMAL HIGH (ref 8–23)
CO2: 26 mmol/L (ref 22–32)
Calcium: 7.7 mg/dL — ABNORMAL LOW (ref 8.9–10.3)
Chloride: 103 mmol/L (ref 98–111)
Creatinine, Ser: 1.94 mg/dL — ABNORMAL HIGH (ref 0.44–1.00)
GFR, Estimated: 26 mL/min — ABNORMAL LOW (ref >60)
Glucose, Bld: 129 mg/dL — ABNORMAL HIGH (ref 70–99)
Potassium: 4.1 mmol/L (ref 3.5–5.1)
Sodium: 136 mmol/L (ref 135–145)

## 2023-08-11 LAB — CBC
HCT: 24.2 % — ABNORMAL LOW (ref 36.0–46.0)
Hemoglobin: 7.7 g/dL — ABNORMAL LOW (ref 12.0–15.0)
MCH: 31.2 pg (ref 26.0–34.0)
MCHC: 31.8 g/dL (ref 30.0–36.0)
MCV: 98 fL (ref 80.0–100.0)
Platelets: 196 10*3/uL (ref 150–400)
RBC: 2.47 MIL/uL — ABNORMAL LOW (ref 3.87–5.11)
RDW: 16.1 % — ABNORMAL HIGH (ref 11.5–15.5)
WBC: 5.8 10*3/uL (ref 4.0–10.5)
nRBC: 0 % (ref 0.0–0.2)

## 2023-08-11 MED ORDER — FUROSEMIDE 10 MG/ML IJ SOLN
80.0000 mg | Freq: Two times a day (BID) | INTRAMUSCULAR | Status: DC
  Administered 2023-08-11 – 2023-08-18 (×15): 80 mg via INTRAVENOUS
  Filled 2023-08-11 (×16): qty 8

## 2023-08-11 NOTE — Progress Notes (Signed)
 Patient Name: Lisa [name] Date of Encounter: 08/11/2023  [facility] Cardiologist: Vina Gull, MD   Interval Summary  .    Feeling well.  Breathing improving.  Reports frequent urination.   Vital Signs .    Vitals:   08/10/23 2121 08/11/23 0050 08/11/23 0335 08/11/23 0834  BP: (!) 125/53 (!) 116/41 (!) 122/50 (!) 113/50  Pulse: 75 72 75 73  Resp: 20 18 16  (!) 24  Temp: 98.4 F (36.9 C) 98.1 F (36.7 C) 98.3 F (36.8 C) 98 F (36.7 C)  TempSrc: Oral Oral Oral Oral  SpO2: 92% 92% 94% 94%  Weight:      Height:        Intake/Output Summary (Last 24 hours) at 08/11/2023 1136 Last data filed at 08/11/2023 1100 Gross per 24 hour  Intake 730 ml  Output 3000 ml  Net -2270 ml      08/09/2023    6:31 PM 07/27/2023    8:19 AM 07/21/2023    4:03 AM  Last 3 Weights  Weight (lbs) 212 lb 1.3 oz 206 lb 3.2 oz 209 lb 7 oz  Weight (kg) 96.2 kg 93.532 kg 95 kg      Telemetry/ECG    ASVP.  - Personally Reviewed  Echo 08/09/23: 1. Left ventricular ejection fraction, by estimation, is 55 to 60%. The  left ventricle has normal function. The left ventricle has no regional  wall motion abnormalities. Left ventricular diastolic parameters are  consistent with Grade I diastolic  dysfunction (impaired relaxation).   2. Right ventricular systolic function is normal. The right ventricular  size is normal.   3. Large pleural effusion.   4. The mitral valve is normal in structure. No evidence of mitral valve  regurgitation. No evidence of mitral stenosis.   5. The aortic valve is normal in structure. Aortic valve regurgitation is  not visualized. No aortic stenosis is present.   6. The inferior vena cava is dilated in size with <50% respiratory  variability, suggesting right atrial pressure of 15 mmHg.   Physical Exam .   VS:  BP (!) 113/50 (BP Location: Right Arm)   Pulse 73   Temp 98 F (36.7 C) (Oral)   Resp (!) 24   Ht 5' 1 (1.549 m)   Wt 96.2 kg   LMP  02/14/1995   SpO2 94%   BMI 40.07 kg/m  , BMI Body mass index is 40.07 kg/m. GENERAL:  Well appearing HEENT: Pupils equal round and reactive, fundi not visualized, oral mucosa unremarkable NECK: + jugular venous distention, waveform within normal limits, carotid upstroke brisk and symmetric, no bruits, no thyromegaly LUNGS:  Clear to auscultation bilaterally HEART:  RRR.  PMI not displaced or sustained,S1 and S2 within normal limits, no S3, no S4, no clicks, no rubs, no murmurs ABD:  Flat, positive bowel sounds normal in frequency in pitch, no bruits, no rebound, no guarding, no midline pulsatile mass, no hepatomegaly, no splenomegaly EXT:  2 plus pulses throughout, 2+ LE edema to upper tibia, no cyanosis no clubbing SKIN:  No rashes no nodules NEURO:  Cranial nerves II through XII grossly intact, motor grossly intact throughout PSYCH:  Cognitively intact, oriented to person place and time   Assessment & Plan .     # Acute on chronic HFpEF:  LVEF has been reduced in the past.  Now recovered.  Admitted with acute on chronic HFpEF in the setting of inconsistent diuretic use at home.  Diuresis was initially  slow but improved after Dr. Loni astutely increased her diuresis despite reassuring OptiVol levels.  Renal function is now also improving.  Given that both are showing improvement, will hold off on RHC for now.  Continue lasix  80mg  iv BID.  Add daily weights.  She is s/p thoracentesis.  Consider SGLT2 inhibitor.   # Demand ischemia:  HS troponin peaked at 53.  No plan for an ischemic evaluation at this time.  Continue aspirin  and her home statin has been held.  Continue metoprolol .   # H/O VT/VF:  No arrhythmia on telemetry.  CRT-D in place.  Continue amiodarone .   # ? Cirrhosis:  # Transaminitis:  AST/ALT improving with diuresis.  May be congestive hepatopathy.  Continue diuresis and evaluation for cirrhosis per primary team.   # Acute on chronic CKD 3:  Creatinine is 1.37 at  baseline.  Slightly improved today.  Continue diuresis and monitor closely.  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 She Is on imatinib .    For questions or updates, please contact [facility] Please consult [email or web address] for contact info under        Signed, Annabella Scarce, MD

## 2023-08-11 NOTE — Plan of Care (Signed)

## 2023-08-11 NOTE — Progress Notes (Addendum)
 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 -Back in the ED 12/29, reported 2 to 3 days of progressive dyspnea on exertion with cough orthopnea and nausea vomiting. -In the ED hypertensive, hypoxic, underwent left thoracentesis by EDP 12/29, 1.1 mL of pleural fluid drained, labs noted creatinine 2.0, sodium 135, mildly elevated LFTs, BNP 804, troponin 39, 46, WBC 8.6, hemoglobin 9.8.  CT chest abdomen pelvis demonstrated large left pleural effusion, moderate right pleural effusion, small ascites -Admitted, started on diuretics, cards following   Subjective: -Breathing slowly improving, oxygen requirement improving as well  Assessment and Plan:  Acute on chronic diastolic CHF Recurrent pleural effusions left> right -Last echo 12/24 with EF 60-65%, normal RV, moderate pericardial effusion -Recent admission for same, baseline on torsemide  20 Mg daily with poor compliance -L thoracentesis 12/10, 900 mL of transudative fluid drained, cytology negative then -Repeat L thoracentesis in ED 12/30, 1.1 L drained, again transudative, despite pleural fluid LDH being high, serum LDH is considerably elevated from CML, cytology is negative -Urine output not accurate, continue IV Lasix  1 more day, Toprol  -Repeat chest x-ray -GDMT limited by CKD -Increase activity, PT OT eval  Acute hypoxic respiratory failure -Secondary to diastolic CHF, pleural effusions left> right -As above, wean O2  Acute on chronic anemia History of CML, on Gleevec  -Anemia panel suggestive of chronic  disease -Hemoglobin relatively stable in the 7.5- 9 range, multifactorial from chronic kidney disease, CML & Gleevec , transfuse if Hb trends down further  History of chronic nausea and vomiting -I suspect this is multifactorial, very common side effect of Gleevec , also suspect a posttussive component to her vomiting -Supportive care, improved  Mechanical fall, generalized weakness -Likely secondary to dyspnea and hypoxia -Imaging unremarkable for injuries, PT OT consult  AKI on CKD 3B,  -baseline creatinine around 1.6-2 -Relatively stable, monitor with diuresis  Abnormal LFTs -Could be related to passive hepatic congestion, Gleevac  History of ventricular tachycardia On amiodarone   Hypothyroidism Continue Synthroid  DVT prophylaxis: lovenox  Code Status: Full Code Family Communication: None present Disposition Plan: Home with home health services  Consultants:    Procedures:   Antimicrobials:    Objective: Vitals:   08/10/23 2121 08/11/23 0050 08/11/23 0335 08/11/23 0834  BP: (!) 125/53 (!) 116/41 (!) 122/50 (!) 113/50  Pulse: 75 72 75 73  Resp: 20 18 16  (!) 24  Temp: 98.4 F (36.9 C) 98.1 F (36.7 C) 98.3 F (36.8 C) 98 F (36.7 C)  TempSrc: Oral Oral Oral Oral  SpO2: 92% 92% 94% 94%  Weight:      Height:        Intake/Output Summary (Last 24 hours) at 08/11/2023 1025 Last data filed at 08/11/2023 1000 Gross per 24 hour  Intake 730 ml  Output 1200 ml  Net -470 ml   Filed Weights   08/09/23 1831  Weight: 96.2 kg    Examination:  General exam: Obese chronically ill female sitting up in bed, AAOx3 HEENT: Positive JVD CVS: S1-S2, regular rhythm Lungs: Few basilar Rales Abdomen: Soft, nontender, bowel sounds present  Extremities: 1+ edema, chronic skin changes and deformities Psychiatry:  Mood & affect appropriate.  Data Reviewed:   CBC: Recent Labs  Lab 08/08/23 1645 08/08/23 1708 08/08/23 1709 08/09/23 0427 08/10/23 0338 08/11/23 0341   WBC 8.6  --   --  6.9 5.1 5.8  NEUTROABS 5.8  --   --  5.5  --   --   HGB 9.8* 10.9* 11.2* 8.1* 7.5* 7.7*  HCT 32.1* 32.0* 33.0* 25.8* 23.3* 24.2*  MCV 104.6*  --   --  100.8* 98.7 98.0  PLT 223  --   --  158 168 196   Basic Metabolic Panel: Recent Labs  Lab 08/08/23 1645 08/08/23 1708 08/08/23 1709 08/08/23 1927 08/09/23 0427 08/10/23 0338 08/11/23 0341  NA 135 139 138  --  139 137 136  K 4.3 4.4 4.4  --  4.3 4.1 4.1  CL 101 103  --   --  105 105 103  CO2 23  --   --   --  26 26 26   GLUCOSE 149* 145*  --   --  113* 99 129*  BUN 22 24*  --   --  23 23 24*  CREATININE 2.06* 2.10*  --   --  2.05* 2.08* 1.94*  CALCIUM  8.4*  --   --   --  8.0* 7.7* 7.7*  MG  --   --   --  2.0 1.8  --   --   PHOS  --   --   --   --  3.8  --   --    GFR: Estimated Creatinine Clearance: 24.1 mL/min (A) (by C-G formula based on SCr of 1.94 mg/dL (H)). Liver Function Tests: Recent Labs  Lab 08/08/23 1645 08/09/23 0427 08/10/23 1524  AST 310* 318* 168*  ALT 194* 204* 167*  ALKPHOS 87 66 78  BILITOT 1.4* 1.2* 0.8  PROT 6.8 5.3* 5.2*  ALBUMIN  3.3* 2.6* 2.4*   No results for input(s): LIPASE, AMYLASE in the last 168 hours. No results for input(s): AMMONIA in the last 168 hours. Coagulation Profile: Recent Labs  Lab 08/09/23 0427  INR 1.5*   Cardiac Enzymes: No results for input(s): CKTOTAL, CKMB, CKMBINDEX, TROPONINI in the last 168 hours. BNP (last 3 results) No results for input(s): PROBNP in the last 8760 hours. HbA1C: No results for input(s): HGBA1C in the last 72 hours. CBG: No results for input(s): GLUCAP in the last 168 hours. Lipid Profile: No results for input(s): CHOL, HDL, LDLCALC, TRIG, CHOLHDL, LDLDIRECT in the last 72 hours. Thyroid  Function Tests: Recent Labs    08/08/23 1927  TSH 2.287   Anemia Panel: Recent Labs    08/09/23 0427 08/09/23 0500 08/09/23 1531  VITAMINB12 1,167*  --  1,119*  FOLATE  --   --  10.8  FERRITIN   --   --  675*  TIBC  --   --  214*  IRON  --   --  62  RETICCTPCT  --  3.0  --    Urine analysis:    Component Value Date/Time   COLORURINE YELLOW 08/09/2023 0742   APPEARANCEUR CLEAR 08/09/2023 0742   LABSPEC 1.009 08/09/2023 0742   PHURINE 5.0 08/09/2023 0742   GLUCOSEU NEGATIVE 08/09/2023 0742   HGBUR NEGATIVE 08/09/2023 0742   BILIRUBINUR NEGATIVE 08/09/2023 0742   BILIRUBINUR negative 06/04/2016 0847   BILIRUBINUR neg 02/14/2015 1431   KETONESUR NEGATIVE 08/09/2023 0742   PROTEINUR NEGATIVE 08/09/2023 0742   UROBILINOGEN 0.2 06/04/2016 0847   NITRITE NEGATIVE 08/09/2023 0742   LEUKOCYTESUR TRACE (A) 08/09/2023 9257  Sepsis Labs: @LABRCNTIP (procalcitonin:4,lacticidven:4)  ) Recent Results (from the past 240 hours)  Body fluid culture w Gram Stain     Status: None (Preliminary result)   Collection Time: 08/08/23  9:34 PM   Specimen: Pleural Fluid  Result Value Ref Range Status   Specimen Description FLUID PLEURAL  Final   Special Requests NONE  Final   Gram Stain NO WBC SEEN NO ORGANISMS SEEN   Final   Culture   Final    NO GROWTH 2 DAYS Performed at [facility], [address] [place], [place] [address]    Report Status PENDING  Incomplete  Blood culture (routine x 2)     Status: None (Preliminary result)   Collection Time: 08/08/23  9:36 PM   Specimen: BLOOD RIGHT ARM  Result Value Ref Range Status   Specimen Description BLOOD RIGHT ARM  Final   Special Requests   Final    BOTTLES DRAWN AEROBIC AND ANAEROBIC Blood Culture adequate volume   Culture   Final    NO GROWTH 3 DAYS Performed at [facility], [address], [place], [place] [address]    Report Status PENDING  Incomplete  Blood culture (routine x 2)     Status: None (Preliminary result)   Collection Time: 08/08/23  9:36 PM   Specimen: BLOOD LEFT HAND  Result Value Ref Range Status   Specimen Description BLOOD LEFT HAND  Final   Special Requests   Final    BOTTLES DRAWN AEROBIC  AND ANAEROBIC Blood Culture adequate volume   Culture   Final    NO GROWTH 3 DAYS Performed at [facility], [address], [place], [place] [address]    Report Status PENDING  Incomplete     Radiology Studies: ECHOCARDIOGRAM COMPLETE Result Date: 08/09/2023    ECHOCARDIOGRAM REPORT   Patient Name:   Lisa [name] Date of Exam: 08/09/2023 Medical Rec #:  [id number]          Height:       62.0 in Accession #:    [id number]         Weight:       206.2 lb Date of Birth:  11-04-1940           BSA:          1.937 m Patient Age:    82 years           BP:           124/48 mmHg Patient Gender: F                  HR:           67 bpm. Exam Location:  Inpatient Procedure: 2D Echo, Cardiac Doppler and Color Doppler Indications:    CHF  History:        Patient has prior history of Echocardiogram examinations, most                 recent 07/18/2023. CHF, Signs/Symptoms:Chest Pain; Risk                 Factors:Hypertension.  Sonographer:    Lanell Maduro Referring Phys: [id number] JUSTIN B HOWERTER IMPRESSIONS  1. Left ventricular ejection fraction, by estimation, is 55 to 60%. The left ventricle has normal function. The left ventricle has no regional wall motion abnormalities. Left ventricular diastolic parameters are consistent with Grade I diastolic dysfunction (impaired relaxation).  2. Right ventricular systolic function is  normal. The right ventricular size is normal.  3. Large pleural effusion.  4. The mitral valve is normal in structure. No evidence of mitral valve regurgitation. No evidence of mitral stenosis.  5. The aortic valve is normal in structure. Aortic valve regurgitation is not visualized. No aortic stenosis is present.  6. The inferior vena cava is dilated in size with <50% respiratory variability, suggesting right atrial pressure of 15 mmHg. FINDINGS  Left Ventricle: Left ventricular ejection fraction, by estimation, is 55 to 60%. The left ventricle has normal function. The left ventricle  has no regional wall motion abnormalities. The left ventricular internal cavity size was normal in size. There is  no left ventricular hypertrophy. Left ventricular diastolic parameters are consistent with Grade I diastolic dysfunction (impaired relaxation). Indeterminate filling pressures. Right Ventricle: The right ventricular size is normal. No increase in right ventricular wall thickness. Right ventricular systolic function is normal. Left Atrium: Left atrial size was normal in size. Right Atrium: Right atrial size was normal in size. Pericardium: There is no evidence of pericardial effusion. Mitral Valve: The mitral valve is normal in structure. No evidence of mitral valve regurgitation. No evidence of mitral valve stenosis. Tricuspid Valve: The tricuspid valve is normal in structure. Tricuspid valve regurgitation is not demonstrated. No evidence of tricuspid stenosis. Aortic Valve: The aortic valve is normal in structure. Aortic valve regurgitation is not visualized. No aortic stenosis is present. Pulmonic Valve: The pulmonic valve was normal in structure. Pulmonic valve regurgitation is not visualized. No evidence of pulmonic stenosis. Aorta: The aortic root is normal in size and structure. Venous: The inferior vena cava is dilated in size with less than 50% respiratory variability, suggesting right atrial pressure of 15 mmHg. IAS/Shunts: No atrial level shunt detected by color flow Doppler. Additional Comments: There is a large pleural effusion.  LEFT VENTRICLE PLAX 2D LVIDd:         4.80 cm      Diastology LVIDs:         4.30 cm      LV e' medial:    6.85 cm/s LV PW:         0.70 cm      LV E/e' medial:  10.8 LV IVS:        0.60 cm      LV e' lateral:   6.31 cm/s LVOT diam:     1.50 cm      LV E/e' lateral: 11.7 LV SV:         53 LV SV Index:   27 LVOT Area:     1.77 cm  LV Volumes (MOD) LV vol d, MOD A2C: 129.0 ml LV vol d, MOD A4C: 81.3 ml LV vol s, MOD A2C: 46.3 ml LV vol s, MOD A4C: 36.1 ml LV SV MOD  A2C:     82.7 ml LV SV MOD A4C:     81.3 ml LV SV MOD BP:      63.6 ml RIGHT VENTRICLE             IVC RV Basal diam:  2.40 cm     IVC diam: 2.30 cm RV S prime:     13.80 cm/s TAPSE (M-mode): 3.9 cm LEFT ATRIUM             Index        RIGHT ATRIUM           Index LA diam:        4.60 cm 2.38  cm/m   RA Area:     12.90 cm LA Vol (A2C):   56.0 ml 28.92 ml/m  RA Volume:   23.40 ml  12.08 ml/m LA Vol (A4C):   50.8 ml 26.23 ml/m LA Biplane Vol: 56.1 ml 28.97 ml/m  AORTIC VALVE LVOT Vmax:   138.00 cm/s LVOT Vmean:  95.000 cm/s LVOT VTI:    0.299 m  AORTA Ao Asc diam: 3.40 cm MITRAL VALVE MV Area (PHT): 3.86 cm    SHUNTS MV E velocity: 73.70 cm/s  Systemic VTI:  0.30 m MV A velocity: 86.60 cm/s  Systemic Diam: 1.50 cm MV E/A ratio:  0.85 Annabella Scarce MD Electronically signed by Annabella Scarce MD Signature Date/Time: 08/09/2023/3:33:30 PM    Final      Scheduled Meds:  amiodarone   200 mg Oral Daily   aspirin  EC  81 mg Oral Daily   enoxaparin  (LOVENOX ) injection  30 mg Subcutaneous Q24H   furosemide   80 mg Intravenous BID   imatinib   400 mg Oral Daily   metoprolol  succinate  12.5 mg Oral Daily   thyroid   15 mg Oral QAC breakfast   timolol   1 drop Left Eye BID   Continuous Infusions:   LOS: 3 days    Time spent:    Sigurd Pac, MD [facility]   08/11/2023, 10:25 AM

## 2023-08-11 NOTE — Plan of Care (Signed)
   Problem: Education: Goal: Knowledge of General Education information will improve Description: Including pain rating scale, medication(s)/side effects and non-pharmacologic comfort measures Outcome: Progressing   Problem: Clinical Measurements: Goal: Will remain free from infection Outcome: Progressing   Problem: Clinical Measurements: Goal: Diagnostic test results will improve Outcome: Progressing

## 2023-08-12 DIAGNOSIS — N1832 Chronic kidney disease, stage 3b: Secondary | ICD-10-CM | POA: Diagnosis not present

## 2023-08-12 DIAGNOSIS — N179 Acute kidney failure, unspecified: Secondary | ICD-10-CM | POA: Diagnosis not present

## 2023-08-12 DIAGNOSIS — I5033 Acute on chronic diastolic (congestive) heart failure: Secondary | ICD-10-CM | POA: Diagnosis not present

## 2023-08-12 LAB — BASIC METABOLIC PANEL
Anion gap: 9 (ref 5–15)
BUN: 24 mg/dL — ABNORMAL HIGH (ref 8–23)
CO2: 24 mmol/L (ref 22–32)
Calcium: 7.7 mg/dL — ABNORMAL LOW (ref 8.9–10.3)
Chloride: 102 mmol/L (ref 98–111)
Creatinine, Ser: 1.72 mg/dL — ABNORMAL HIGH (ref 0.44–1.00)
GFR, Estimated: 30 mL/min — ABNORMAL LOW (ref >60)
Glucose, Bld: 126 mg/dL — ABNORMAL HIGH (ref 70–99)
Potassium: 4.2 mmol/L (ref 3.5–5.1)
Sodium: 135 mmol/L (ref 135–145)

## 2023-08-12 LAB — CBC
HCT: 26 % — ABNORMAL LOW (ref 36.0–46.0)
Hemoglobin: 8 g/dL — ABNORMAL LOW (ref 12.0–15.0)
MCH: 31.1 pg (ref 26.0–34.0)
MCHC: 30.8 g/dL (ref 30.0–36.0)
MCV: 101.2 fL — ABNORMAL HIGH (ref 80.0–100.0)
Platelets: 137 10*3/uL — ABNORMAL LOW (ref 150–400)
RBC: 2.57 MIL/uL — ABNORMAL LOW (ref 3.87–5.11)
RDW: 16.4 % — ABNORMAL HIGH (ref 11.5–15.5)
WBC: 5 10*3/uL (ref 4.0–10.5)
nRBC: 0 % (ref 0.0–0.2)

## 2023-08-12 LAB — BODY FLUID CULTURE W GRAM STAIN: Gram Stain: NONE SEEN

## 2023-08-12 MED ORDER — BISACODYL 10 MG RE SUPP: 10.0000 mg | Freq: Once | RECTAL | Status: DC

## 2023-08-12 NOTE — Plan of Care (Signed)
   Problem: Education: Goal: Knowledge of General Education information will improve Description: Including pain rating scale, medication(s)/side effects and non-pharmacologic comfort measures Outcome: Progressing   Problem: Clinical Measurements: Goal: Will remain free from infection Outcome: Progressing   Problem: Clinical Measurements: Goal: Diagnostic test results will improve Outcome: Progressing

## 2023-08-12 NOTE — Progress Notes (Signed)
 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 -Back in the ED 12/29, reported 2 to 3 days of progressive dyspnea on exertion with cough orthopnea and nausea vomiting. -In the ED hypertensive, hypoxic, underwent left thoracentesis by EDP 12/29, 1.1 mL of pleural fluid drained, labs noted creatinine 2.0, sodium 135, mildly elevated LFTs, BNP 804, troponin 39, 46, WBC 8.6, hemoglobin 9.8.  CT chest abdomen pelvis demonstrated large left pleural effusion, moderate right pleural effusion, small ascites -Admitted, started on diuretics, cards following   Subjective: -Breathing slowly improving, oxygen requirement improving as well  Assessment and Plan:  Acute on chronic diastolic CHF Recurrent pleural effusions left> right -Last echo 12/24 with EF 60-65%, normal RV, moderate pericardial effusion -Recent admission for same, baseline on torsemide  20 Mg daily with poor compliance -L thoracentesis 12/10, 900 mL of transudative fluid drained, cytology negative then -Repeat L thoracentesis in ED 12/30, 1.1 L drained, again transudative, despite pleural fluid LDH being high, serum LDH is considerably elevated from CML, cytology is negative -Weight is down 13 LB, continue IV Lasix  1 today , yesterday's x-ray with worsening effusion on the left, may need repeat thoracentesis, -Repeat chest x-ray tomorrow -GDMT limited by CKD -Increase activity, PT OT eval  Acute hypoxic respiratory failure -Secondary to diastolic CHF, pleural effusions left> right -As above, wean O2 -Repeat x-ray  tomorrow  Acute on chronic anemia History of CML, on Gleevec  -Anemia panel suggestive of chronic disease -Hemoglobin relatively stable in the 7.5- 9 range, multifactorial from chronic kidney disease, CML & Gleevec , transfuse if Hb trends down further  History of chronic nausea and vomiting -I suspect this is multifactorial, very common side effect of Gleevec , also suspect a posttussive component to her vomiting -Supportive care, improved  Mechanical fall, generalized weakness -Likely secondary to dyspnea and hypoxia -Imaging unremarkable for injuries, PT OT consult  AKI on CKD 3B,  -baseline creatinine around 1.6-2 -Relatively stable, monitor with diuresis  Abnormal LFTs -Could be related to passive hepatic congestion, Gleevac  History of ventricular tachycardia On amiodarone   Hypothyroidism Continue Synthroid  DVT prophylaxis: lovenox  Code Status: Full Code Family Communication: None present Disposition Plan: Home with home health services  Consultants:    Procedures:   Antimicrobials:    Objective: Vitals:   08/11/23 2119 08/12/23 0205 08/12/23 0439 08/12/23 0805  BP: (!) 136/54 (!) 118/49 123/74 (!) 113/43  Pulse: 64 69 65 73  Resp: 20 20 20  (!) 23  Temp: 98.1 F (36.7 C) 98.7 F (37.1 C) 98.1 F (36.7 C) 98.1 F (36.7 C)  TempSrc: Oral Oral Oral Oral  SpO2: 92% 93% 95% 95%  Weight:   90.3 kg   Height:        Intake/Output Summary (Last 24 hours) at 08/12/2023 1001 Last data filed at 08/12/2023 0827 Gross per 24 hour  Intake 960 ml  Output 1801 ml  Net -841 ml   Filed Weights   08/09/23 1831 08/12/23 0439  Weight: 96.2 kg 90.3 kg    Examination:  General exam: Obese chronically ill female sitting up in bed, AAOx3 HEENT: Positive JVD CVS: S1-S2, regular rhythm Lungs: Decreased sounds at the bases Abdomen: Soft,  nontender, bowel sounds present  Extremities: Trace edema, chronic skin changes and deformities  Psychiatry:  Mood & affect  appropriate.     Data Reviewed:   CBC: Recent Labs  Lab 08/08/23 1645 08/08/23 1708 08/08/23 1709 08/09/23 0427 08/10/23 0338 08/11/23 0341 08/12/23 0247  WBC 8.6  --   --  6.9 5.1 5.8 5.0  NEUTROABS 5.8  --   --  5.5  --   --   --   HGB 9.8*   < > 11.2* 8.1* 7.5* 7.7* 8.0*  HCT 32.1*   < > 33.0* 25.8* 23.3* 24.2* 26.0*  MCV 104.6*  --   --  100.8* 98.7 98.0 101.2*  PLT 223  --   --  158 168 196 137*   < > = values in this interval not displayed.   Basic Metabolic Panel: Recent Labs  Lab 08/08/23 1645 08/08/23 1708 08/08/23 1709 08/08/23 1927 08/09/23 0427 08/10/23 0338 08/11/23 0341 08/12/23 0247  NA 135 139 138  --  139 137 136 135  K 4.3 4.4 4.4  --  4.3 4.1 4.1 4.2  CL 101 103  --   --  105 105 103 102  CO2 23  --   --   --  26 26 26 24   GLUCOSE 149* 145*  --   --  113* 99 129* 126*  BUN 22 24*  --   --  23 23 24* 24*  CREATININE 2.06* 2.10*  --   --  2.05* 2.08* 1.94* 1.72*  CALCIUM  8.4*  --   --   --  8.0* 7.7* 7.7* 7.7*  MG  --   --   --  2.0 1.8  --   --   --   PHOS  --   --   --   --  3.8  --   --   --    GFR: Estimated Creatinine Clearance: 26.2 mL/min (A) (by C-G formula based on SCr of 1.72 mg/dL (H)). Liver Function Tests: Recent Labs  Lab 08/08/23 1645 08/09/23 0427 08/10/23 1524  AST 310* 318* 168*  ALT 194* 204* 167*  ALKPHOS 87 66 78  BILITOT 1.4* 1.2* 0.8  PROT 6.8 5.3* 5.2*  ALBUMIN  3.3* 2.6* 2.4*   No results for input(s): LIPASE, AMYLASE in the last 168 hours. No results for input(s): AMMONIA in the last 168 hours. Coagulation Profile: Recent Labs  Lab 08/09/23 0427  INR 1.5*   Cardiac Enzymes: No results for input(s): CKTOTAL, CKMB, CKMBINDEX, TROPONINI in the last 168 hours. BNP (last 3 results) No results for input(s): PROBNP in the last 8760 hours. HbA1C: No results for input(s): HGBA1C in the last 72 hours. CBG: No results for input(s): GLUCAP in the last 168 hours. Lipid Profile: No results  for input(s): CHOL, HDL, LDLCALC, TRIG, CHOLHDL, LDLDIRECT in the last 72 hours. Thyroid  Function Tests: No results for input(s): TSH, T4TOTAL, FREET4, T3FREE, THYROIDAB in the last 72 hours.  Anemia Panel: Recent Labs    08/09/23 1531  VITAMINB12 1,119*  FOLATE 10.8  FERRITIN 675*  TIBC 214*  IRON 62   Urine analysis:    Component Value Date/Time   COLORURINE YELLOW 08/09/2023 0742   APPEARANCEUR CLEAR 08/09/2023 0742   LABSPEC 1.009 08/09/2023 0742   PHURINE 5.0 08/09/2023 0742   GLUCOSEU NEGATIVE 08/09/2023 0742   HGBUR NEGATIVE 08/09/2023 0742   BILIRUBINUR NEGATIVE 08/09/2023 0742   BILIRUBINUR negative 06/04/2016 0847   BILIRUBINUR neg 02/14/2015 1431   KETONESUR  NEGATIVE 08/09/2023 0742   PROTEINUR NEGATIVE 08/09/2023 0742   UROBILINOGEN 0.2 06/04/2016 0847   NITRITE NEGATIVE 08/09/2023 0742   LEUKOCYTESUR TRACE (A) 08/09/2023 0742   Sepsis Labs: @LABRCNTIP (procalcitonin:4,lacticidven:4)  ) Recent Results (from the past 240 hours)  Body fluid culture w Gram Stain     Status: None (Preliminary result)   Collection Time: 08/08/23  9:34 PM   Specimen: Pleural Fluid  Result Value Ref Range Status   Specimen Description FLUID PLEURAL  Final   Special Requests NONE  Final   Gram Stain NO WBC SEEN NO ORGANISMS SEEN   Final   Culture   Final    NO GROWTH 3 DAYS Performed at [facility], [address], [place], [place] [address]    Report Status PENDING  Incomplete  Blood culture (routine x 2)     Status: None (Preliminary result)   Collection Time: 08/08/23  9:36 PM   Specimen: BLOOD RIGHT ARM  Result Value Ref Range Status   Specimen Description BLOOD RIGHT ARM  Final   Special Requests   Final    BOTTLES DRAWN AEROBIC AND ANAEROBIC Blood Culture adequate volume   Culture   Final    NO GROWTH 4 DAYS Performed at [facility], [address] [place], [place] [address]    Report Status PENDING  Incomplete  Blood culture  (routine x 2)     Status: None (Preliminary result)   Collection Time: 08/08/23  9:36 PM   Specimen: BLOOD LEFT HAND  Result Value Ref Range Status   Specimen Description BLOOD LEFT HAND  Final   Special Requests   Final    BOTTLES DRAWN AEROBIC AND ANAEROBIC Blood Culture adequate volume   Culture   Final    NO GROWTH 4 DAYS Performed at [facility], [address], [place], [place] [address]    Report Status PENDING  Incomplete     Radiology Studies: DG Chest 2 View Result Date: 08/11/2023 CLINICAL DATA:  Hypoxia. EXAM: CHEST - 2 VIEW COMPARISON:  08/08/2023 FINDINGS: Patient is rotated to the right. Lungs are adequately inflated demonstrate interval worsening of a moderate to large left effusion and stable small right effusion. Likely associated atelectasis in the lung bases. No pneumothorax. Left-sided pacemaker unchanged. Remainder of the exam is unchanged. IMPRESSION: Interval worsening of a moderate to large left effusion and stable small right effusion. Likely associated atelectasis in the lung bases. Electronically Signed   By: Toribio Agreste M.D.   On: 08/11/2023 14:28     Scheduled Meds:  amiodarone   200 mg Oral Daily   aspirin  EC  81 mg Oral Daily   bisacodyl   10 mg Rectal Once   enoxaparin  (LOVENOX ) injection  30 mg Subcutaneous Q24H   furosemide   80 mg Intravenous BID   imatinib   400 mg Oral Daily   metoprolol  succinate  12.5 mg Oral Daily   thyroid   15 mg Oral QAC breakfast   timolol   1 drop Left Eye BID   Continuous Infusions:   LOS: 4 days    Time spent:    Sigurd Pac, MD [facility]   08/12/2023, 10:01 AM

## 2023-08-12 NOTE — Progress Notes (Signed)
 Heart Failure Navigator Progress Note  Assessed for Heart & Vascular TOC clinic readiness.  Patient with EF 55-60%, patient declined HF TOC appointment stated she will just stay with her [facility] appointment on 09/07/23. Educated patient on sign and symptoms of heart failure, daily weights, Diet/ fluid restrictions, taking all medications as prescribed and attending all medical appointments. .   Navigator will sign off at this time.   Stephane Haddock, BSN, Scientist, Clinical (histocompatibility And Immunogenetics) Only

## 2023-08-12 NOTE — Progress Notes (Signed)
 Mobility Specialist Progress Note:   08/12/23 1215  Mobility  Activity Transferred from chair to bed;Ambulated with assistance in room  Level of Assistance Contact guard assist, steadying assist  Assistive Device Front wheel walker  Distance Ambulated (ft) 6 ft  Activity Response Tolerated well  Mobility Referral Yes  Mobility visit 1 Mobility  Mobility Specialist Start Time (ACUTE ONLY) 1215  Mobility Specialist Stop Time (ACUTE ONLY) 1225  Mobility Specialist Time Calculation (min) (ACUTE ONLY) 10 min   Pt requesting to transfer to bed. Required only minG assist to stand and ambulate to bed from chair. VSS on 2LO2. Pt received on RA, however requested RN to place back on 2L d/t CP/SOB. Pt back in bed with all needs met, RN in to give meds.   Therisa Rana Mobility Specialist Please contact via SecureChat or  Rehab office at [phone]

## 2023-08-12 NOTE — Progress Notes (Addendum)
 Patient Name: Lisa [name] Date of Encounter: 08/12/2023 [facility] Cardiologist: Vina Gull, MD   Interval Summary  .    Patient is up sitting in recliner. She reports doing much better than she has been. She states that she has been urinating often. She denies any shortness of breath and states she can feel the diuretics working well.   Vital Signs .    Vitals:   08/11/23 2119 08/12/23 0205 08/12/23 0439 08/12/23 0805  BP: (!) 136/54 (!) 118/49 123/74 (!) 113/43  Pulse: 64 69 65 73  Resp: 20 20 20  (!) 23  Temp: 98.1 F (36.7 C) 98.7 F (37.1 C) 98.1 F (36.7 C) 98.1 F (36.7 C)  TempSrc: Oral Oral Oral Oral  SpO2: 92% 93% 95% 95%  Weight:   90.3 kg   Height:        Intake/Output Summary (Last 24 hours) at 08/12/2023 1028 Last data filed at 08/12/2023 0827 Gross per 24 hour  Intake 960 ml  Output 1801 ml  Net -841 ml      08/12/2023    4:39 AM 08/09/2023    6:31 PM 07/27/2023    8:19 AM  Last 3 Weights  Weight (lbs) 199 lb 1.2 oz 212 lb 1.3 oz 206 lb 3.2 oz  Weight (kg) 90.3 kg 96.2 kg 93.532 kg      Telemetry/ECG    Ventricular paced rhythm, HR 61 bpm - Personally Reviewed  Physical Exam .   GEN: No acute distress. Sitting up in recliner  Neck: No JVD Cardiac: RRR, no murmurs.  Respiratory: Clear to auscultation bilaterally GI: Soft, nontender, non-distended  MS: Chronic lymphedema present in bilateral LE   Assessment & Plan .     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 Likely non-ischemic/mixed etiology  Last LHC (09/2019) showed non-obstructive CAD with patent LAD stent  Echo (08/09/23) showed LVEF 55-60%, no regional wall motion abnormalities, grade I diastolic dysfunction, normal RV, large pleural effusion, IVC dilated Patient had inconsistent use of diuretics at home Due to body habitus, hard to  accurately assess volume status, patient reports feeling better with Lasix  so will continue dose today and consider tapering pending labs will discuss with MD  Most recent creatinine down to 1.72 this morning  -- Continue Lasix  80 mg IV BID  -- Continue Toprol  12.5 mg daily  -- Consider starting SGLT2i if GFR remains >25  -- Continue daily weights   Recurrent pleural effusions She had a bedside thoracentesis 08/08/23 in the ED with 900 cc output, transudative, cytology showed reactive mesothelial cells present, negative for malignancy CXR from yesterday showed worsening effusion -- Treatment per primary   History of VT/VF CRT-D in situ Device interrogation from 08/02/23 showed normal function -- Continue amiodarone  200 mg daily   Transaminitis  Recent labs: AST 168 (down from 318), ALT 167 (down from 204)  CT (08/08/23) showed small volume ascites, subtle capsular nodularity of liver suspicious for cirrhosis  -- Continue diuresis  -- Treatment and evaluation for cirrhosis per primary team   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  Agree as above, with the following exceptions and changes as noted below. Vomited just prior to my arrival. Nauseated but does feel diuresis is helping. Cr improving Gen: nauseated, CV: RRR, no murmurs, Lungs: clear, Abd: soft, Extrem: 3+ and desquamating skin Neuro/Psych: alert and oriented x 3, normal mood and affect. All available labs, radiology testing, previous records reviewed. Improving with lasix  in Cr and symptoms. Nausea with weaning O2, presenting problem of nausea and vomiting may be secondary to Gleevec . Effusion pleural worsening despite diuresis, agree  may need repeat thoracentesis.  Cr: 1.94> 1.72 UOP yesterday: 2.3L Weight: 96 > 90 kg Net negative for admission: -1.8 L Diuretic plan: continue lasix  80 mg IV BID.   Lisa Donalson A Mackenzye Mackel, MD 08/12/23 2:15 PM

## 2023-08-12 NOTE — Progress Notes (Signed)
 Occupational Therapy Treatment Patient Details Name: Lisa [name] MRN: [id number] DOB: [birth year] Today's Date: 08/12/2023   History of present illness Pt is an 83 year old woman admitted 10/08/2022 with suspected acute on chronic diastolic heart failure after presenting from home complaining of shortness of breath. PMH includes: + for pseudomonas, CHF exacerbation, L pleural effusion. 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   OT comments  Pt making progress with functional goals. Pt with c/o of nausea earlier when OT attempted to see her before lunchtime and requested OT return later; OT returned later and pt had been given Zofran  by her nurse and she was agreeable to activity with OT. OT will continue to follow acutely to maximize level of function and safety      If plan is discharge home, recommend the following:  A little help with walking and/or transfers;A little help with bathing/dressing/bathroom;Assistance with cooking/housework   Equipment Recommendations  None recommended by OT    Recommendations for Other Services      Precautions / Restrictions Precautions Precautions: Fall Precaution Comments: watch O2, on chemo meds. reports fell OOB when getting tangled in sheets trying to get up, ~ 3days PTA Restrictions Weight Bearing Restrictions Per Provider Order: No       Mobility Bed Mobility Overal bed mobility: Needs Assistance Bed Mobility: Supine to Sit, Sit to Supine     Supine to sit: Contact guard, Used rails, HOB elevated Sit to supine: Mod assist   General bed mobility comments: increased time and effort sup - sit, mod A with LE mgt back onto bed    Transfers Overall transfer level: Needs assistance Equipment used: Rolling walker (2 wheels) Transfers: Sit to/from Stand, Bed to chair/wheelchair/BSC Sit to Stand: Contact guard assist     Step pivot transfers: Contact guard assist           Balance Overall balance  assessment: Needs assistance Sitting-balance support: Feet unsupported, Bilateral upper extremity supported Sitting balance-Leahy Scale: Fair     Standing balance support: Bilateral upper extremity supported, During functional activity Standing balance-Leahy Scale: Poor                             ADL either performed or assessed with clinical judgement   ADL Overall ADL's : Needs assistance/impaired Eating/Feeding: Set up;Bed level   Grooming: Supervision/safety;Sitting   Upper Body Bathing: Contact guard assist;Sitting Upper Body Bathing Details (indicate cue type and reason): simulated     Upper Body Dressing : Contact guard assist;Sitting       Toilet Transfer: Contact guard assist;Stand-pivot;Rolling walker (2 wheels);BSC/3in1   Toileting- Clothing Manipulation and Hygiene: Minimal assistance;Sitting/lateral lean Toileting - Clothing Manipulation Details (indicate cue type and reason): anteriror hygiene     Functional mobility during ADLs: Contact guard assist;Rolling walker (2 wheels)      Extremity/Trunk Assessment Upper Extremity Assessment Upper Extremity Assessment: Generalized weakness   Lower Extremity Assessment Lower Extremity Assessment: Defer to PT evaluation   Cervical / Trunk Assessment Cervical / Trunk Assessment: Normal;Other exceptions Cervical / Trunk Exceptions: chronic lymphedema, body habitus, chronic O2    Vision Baseline Vision/History: 0 No visual deficits Ability to See in Adequate Light: 0 Adequate Patient Visual Report: No change from baseline     Perception     Praxis      Cognition Arousal: Alert Behavior During Therapy: WFL for tasks assessed/performed Overall Cognitive Status: Within Functional Limits  for tasks assessed                                          Exercises      Shoulder Instructions       General Comments      Pertinent Vitals/ Pain       Pain Assessment Pain  Assessment: No/denies pain Pain Intervention(s): Monitored during session, Repositioned  Home Living                                          Prior Functioning/Environment              Frequency  Min 1X/week        Progress Toward Goals  OT Goals(current goals can now be found in the care plan section)  Progress towards OT goals: Progressing toward goals  ADL Goals Pt Will Perform Grooming: with supervision;with set-up;sitting Pt Will Perform Upper Body Bathing: with supervision Pt Will Perform Lower Body Bathing: with mod assist Pt Will Perform Upper Body Dressing: with supervision Pt Will Perform Lower Body Dressing: with mod assist Pt Will Transfer to Toilet: with min assist;with contact guard assist Pt Will Perform Toileting - Clothing Manipulation and hygiene: with mod assist;with min assist  Plan      Co-evaluation                 AM-PAC OT 6 Clicks Daily Activity     Outcome Measure   Help from another person eating meals?: None Help from another person taking care of personal grooming?: A Little Help from another person toileting, which includes using toliet, bedpan, or urinal?: A Little Help from another person bathing (including washing, rinsing, drying)?: A Lot Help from another person to put on and taking off regular upper body clothing?: A Little Help from another person to put on and taking off regular lower body clothing?: A Lot 6 Click Score: 17    End of Session Equipment Utilized During Treatment: Rolling walker (2 wheels)  OT Visit Diagnosis: Unsteadiness on feet (R26.81);Other abnormalities of gait and mobility (R26.89);Other (comment);Muscle weakness (generalized) (M62.81)   Activity Tolerance Patient limited by fatigue   Patient Left in bed;with call bell/phone within reach   Nurse Communication          Time: 8652-8578 OT Time Calculation (min): 34 min  Charges: OT General Charges $OT Visit: 1  Visit OT Treatments $Self Care/Home Management : 8-22 mins $Therapeutic Activity: 8-22 mins    [name] 08/12/2023, 2:36 PM

## 2023-08-12 NOTE — Consult Note (Addendum)
 WOC Nurse Consult Note: Reason for Consult: Consult requested for Deep tissue pressure injury to right heel.  Performed remotely after review of the progress notes.  Deep tissue pressure injuries are high risk to evolve into full thickness tissue loss within 7-10 days.  This was noted to be present on admission  according to the bedside nurse's wound care flow sheet.  Topical treatment orders provided for bedside nurses to perform as follows: Float heels to reduce pressure. Apply Xeroform gauze and foam dressing to right heel Q day.  Change foam dressing to right heel Q 3 days or PRN soiling. Please re-consult if further assistance is needed.  Thank-you,  Stephane Fought MSN, RN, CWOCN, [place], CNS [phone]

## 2023-08-13 ENCOUNTER — Inpatient Hospital Stay (HOSPITAL_COMMUNITY): Payer: Medicare Other

## 2023-08-13 DIAGNOSIS — I5033 Acute on chronic diastolic (congestive) heart failure: Secondary | ICD-10-CM | POA: Diagnosis not present

## 2023-08-13 LAB — BASIC METABOLIC PANEL
Anion gap: 6 (ref 5–15)
BUN: 21 mg/dL (ref 8–23)
CO2: 31 mmol/L (ref 22–32)
Calcium: 7.9 mg/dL — ABNORMAL LOW (ref 8.9–10.3)
Chloride: 98 mmol/L (ref 98–111)
Creatinine, Ser: 1.76 mg/dL — ABNORMAL HIGH (ref 0.44–1.00)
GFR, Estimated: 29 mL/min — ABNORMAL LOW (ref >60)
Glucose, Bld: 95 mg/dL (ref 70–99)
Potassium: 4.1 mmol/L (ref 3.5–5.1)
Sodium: 135 mmol/L (ref 135–145)

## 2023-08-13 LAB — CULTURE, BLOOD (ROUTINE X 2)
Culture: NO GROWTH
Culture: NO GROWTH
Special Requests: ADEQUATE
Special Requests: ADEQUATE

## 2023-08-13 LAB — CBC
HCT: 25.6 % — ABNORMAL LOW (ref 36.0–46.0)
Hemoglobin: 8.2 g/dL — ABNORMAL LOW (ref 12.0–15.0)
MCH: 31.5 pg (ref 26.0–34.0)
MCHC: 32 g/dL (ref 30.0–36.0)
MCV: 98.5 fL (ref 80.0–100.0)
Platelets: 203 10*3/uL (ref 150–400)
RBC: 2.6 MIL/uL — ABNORMAL LOW (ref 3.87–5.11)
RDW: 16.7 % — ABNORMAL HIGH (ref 11.5–15.5)
WBC: 4.9 10*3/uL (ref 4.0–10.5)
nRBC: 0 % (ref 0.0–0.2)

## 2023-08-13 MED ORDER — HYDROMORPHONE HCL 1 MG/ML IJ SOLN
1.0000 mg | INTRAMUSCULAR | Status: DC | PRN
  Administered 2023-08-13: 1 mg via INTRAVENOUS
  Filled 2023-08-13: qty 1

## 2023-08-13 MED ORDER — LIDOCAINE HCL 1 % IJ SOLN
INTRAMUSCULAR | Status: AC
  Filled 2023-08-13: qty 20

## 2023-08-13 MED ORDER — ONDANSETRON HCL 4 MG/2ML IJ SOLN
INTRAMUSCULAR | Status: AC
  Filled 2023-08-13: qty 2

## 2023-08-13 NOTE — Progress Notes (Signed)
 Patient Name: Lisa [name] Date of Encounter: 08/13/2023 [facility] Cardiologist: Vina Gull, MD   Interval Summary  .    Patient is sitting up in the bed. She says she is feeling better than she has and she seems more lively than yesterday. She does admit that her nursing staff did not weigh her this morning, which I believe as the weight  is unchanged from the day prior. She does not report any chest pain, shortness of breath, palpitations or fatigue. She says she has been getting up and going to the bathroom with OT and has no issues with that. She has been weaning off oxygen and has been at SpO2 of 94%. She has been made aware that her CXR shows increasing pleural effusion, although patient has no symptoms, and she is reluctant to get another thoracentesis done but she understands the necessity.   Vital Signs .    Vitals:   08/12/23 2338 08/13/23 0311 08/13/23 0546 08/13/23 0717  BP: (!) 131/56  (!) 129/49 (!) 130/44  Pulse: 69  65 69  Resp: 20  19 20   Temp: 98.2 F (36.8 C)  98.3 F (36.8 C) 98.5 F (36.9 C)  TempSrc: Oral  Axillary Oral  SpO2: 98%  90% 94%  Weight: 94.3 kg 94.3 kg    Height:        Intake/Output Summary (Last 24 hours) at 08/13/2023 0935 Last data filed at 08/13/2023 0827 Gross per 24 hour  Intake 360 ml  Output 800 ml  Net -440 ml      08/13/2023    3:11 AM 08/12/2023   11:38 PM 08/12/2023    4:39 AM  Last 3 Weights  Weight (lbs) 207 lb 14.3 oz 207 lb 14.3 oz 199 lb 1.2 oz  Weight (kg) 94.3 kg 94.3 kg 90.3 kg      Telemetry/ECG    Ventricular paced rhythm, HR 71 bpm - Personally Reviewed  Physical Exam .   GEN: No acute distress. Patient sitting up in the bed  Neck: No JVD Cardiac: RRR, no murmurs.  Respiratory: Clear to auscultation on right, diminished breath sounds on left GI: Soft, nontender MS: Chronic lymphedema present in bilateral LE   Assessment & Plan .     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  Mixed etiology  cardiomyopathy  LVEF down to 25-30% in 2017, s/p LAD stent LVEF improved to 60-65% LVEF fluctuates between 45% to 60% over the years. Likely non-ischemic/mixed etiology  Last LHC (09/2019) showed non-obstructive CAD with patent LAD stent  Echo (08/09/23) showed LVEF 55-60%, no regional wall motion abnormalities, grade I diastolic dysfunction, normal RV, large pleural effusion, IVC dilated Patient had inconsistent use of diuretics at home Due to body habitus, hard to accurately assess volume status, patient reports feeling better with Lasix  so will continue dose today  Most recent creatinine stable at 1.76 this morning Weight unchanged from yesterday but patient does report that she was not weighed this morning Patient also reports that often when the staff is helping her use the restroom she notices they are not measuring her urine therefore I/Os are likely not accurate -- Continue daily weights -- Continue Lasix  80 mg IV BID  -- Continue Toprol  12.5 mg daily  -- Consider starting SGLT2i upon discharge if GFR remains >25    Recurrent pleural effusions She had a bedside thoracentesis 08/08/23 in the ED with 900 cc output, transudative, cytology showed reactive mesothelial cells present, negative for malignancy  Patient does have significantly diminished breath sounds on the left side but reports no symptoms related to her pleural effusions  Repeat CXRs have shown worsening left sided pleural effusions  -- Treatment per primary, likely going to require a repeat thoracentesis    History of VT/VF CRT-D in situ Device interrogation from 08/02/23 showed normal function -- Continue amiodarone  200 mg daily    Transaminitis  Recent labs: AST 168 (down from 318), ALT 167 (down from 204)  CT (08/08/23) showed small volume ascites, subtle capsular nodularity of liver suspicious for cirrhosis  -- Continue diuresis  -- Treatment and evaluation for cirrhosis per primary team    Acute on chronic CKD  3b Baseline creatinine 1.37 Most recent value 1.76, down from 2.10 -- Continue diuresis  -- Continue to monitor labs  For questions or updates, please contact [facility] Please consult [email or web address] for contact info under     Signed, Waddell DELENA Donath, PA-C

## 2023-08-13 NOTE — Progress Notes (Signed)
 PROGRESS NOTE    Lisa [name]  FMW:[id number] DOB: [birth year] DOA: 08/08/2023 PCP: Claudene Thersia Reusing, DO  82/F with CAD, chronic diastolic CHF, CML on Gleevec , chronic nausea and vomiting, chronic anemia, paroxysmal ventricular tachycardia, hypothyroidism, CKD 3B, recently hospitalized 12/7-12/11 with diastolic CHF complicated by left pleural effusion, diuresed and underwent left thoracentesis on 12/10, 900 mL of transudative fluid was drained. -Back in the ED 12/29, reported 2 to 3 days of progressive dyspnea on exertion with cough orthopnea and nausea vomiting. -In the ED hypertensive, hypoxic, underwent left thoracentesis by EDP 12/29, 1.1 mL of pleural fluid drained, labs noted creatinine 2.0, sodium 135, mildly elevated LFTs, BNP 804, troponin 39, 46, WBC 8.6, hemoglobin 9.8.  CT chest abdomen pelvis demonstrated large left pleural effusion, moderate right pleural effusion, small ascites, liver nodularity concerning for cirrhosis -Admitted, started on diuretics, cards following   Subjective: -Feels better, breathing is improving, spit up some phlegm yesterday  Assessment and Plan:  Acute on chronic diastolic CHF Recurrent pleural effusions left> right -Last echo 12/24 with EF 60-65%, normal RV, moderate pericardial effusion -Recent admission for same, baseline on torsemide  20 Mg daily with poor compliance -L thoracentesis 12/10, 900 mL of transudative fluid drained, cytology negative then -Repeat L thoracentesis in ED 12/30, 1.1 L drained, again transudative, despite pleural fluid LDH being high, serum LDH is considerably elevated from CML, cytology is negative -Weight is down 6 LB, continue IV Lasix , chest x-ray with worsening left effusion, ordering repeat thoracentesis, will repeat fluid analysis and cytology -Suspect cirrhosis and hypoalbuminemia contributing to recurrent effusions -GDMT limited by CKD -Increase activity, PT OT eval  Cirrhosis, 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 12/29 Hypoalbuminemia -Contributing to fluid overloaded state -Diuretics as above  Acute hypoxic respiratory failure -Secondary to diastolic CHF, pleural effusions left> right -As above, wean O2 -See above  Acute on chronic anemia History of CML, on Gleevec  -Anemia panel suggestive of chronic disease -Hemoglobin relatively stable in the 7.5- 9 range, multifactorial from chronic kidney disease, CML & Gleevec , transfuse if Hb trends down further  History of chronic nausea and vomiting -I suspect this is multifactorial, very common side effect of Gleevec , also suspect a posttussive component to her vomiting -Supportive care,   Mechanical fall, generalized weakness -Likely secondary to dyspnea and hypoxia -Imaging unremarkable for injuries, PT OT consult  AKI on CKD 3B,  -baseline creatinine around 1.6-2 -Relatively stable, monitor with diuresis  Abnormal LFTs -Could be related to passive hepatic congestion, Gleevac  History of ventricular tachycardia On amiodarone   Hypothyroidism Continue Synthroid  DVT prophylaxis: lovenox  Code Status: Full Code Family Communication: None present Disposition Plan: Home with home health services  Consultants:    Procedures:   Antimicrobials:    Objective: Vitals:   08/13/23 0311 08/13/23 0546 08/13/23 0717 08/13/23 1057  BP:  (!) 129/49 (!) 130/44 116/63  Pulse:  65 69 74  Resp:  19 20 20   Temp:  98.3 F (36.8 C) 98.5 F (36.9 C) 98 F (36.7 C)  TempSrc:  Axillary Oral Oral  SpO2:  90% 94% 92%  Weight: 94.3 kg     Height:        Intake/Output Summary (Last 24 hours) at 08/13/2023 1149 Last data filed at 08/13/2023 1025 Gross per 24 hour  Intake 360 ml  Output 900 ml  Net -540 ml   Filed Weights   08/12/23 0439 08/12/23 2338 08/13/23 0311  Weight: 90.3 kg 94.3 kg 94.3 kg    Examination:  General exam: Obese chronically ill female sitting up in bed, AAOx3 HEENT: Positive JVD VS: S1-S2, regular  rhythm Lungs with decreased breath sounds on the left Abdomen: Soft, nontender, bowel sounds present Extremities: Trace edema, chronic skin changes and deformities  Psychiatry:  Mood & affect appropriate.     Data Reviewed:   CBC: Recent Labs  Lab 08/08/23 1645 08/08/23 1708 08/09/23 0427 08/10/23 0338 08/11/23 0341 08/12/23 0247 08/13/23 0326  WBC 8.6  --  6.9 5.1 5.8 5.0 4.9  NEUTROABS 5.8  --  5.5  --   --   --   --   HGB 9.8*   < > 8.1* 7.5* 7.7* 8.0* 8.2*  HCT 32.1*   < > 25.8* 23.3* 24.2* 26.0* 25.6*  MCV 104.6*  --  100.8* 98.7 98.0 101.2* 98.5  PLT 223  --  158 168 196 137* 203   < > = values in this interval not displayed.   Basic Metabolic Panel: Recent Labs  Lab 08/08/23 1927 08/09/23 0427 08/10/23 0338 08/11/23 0341 08/12/23 0247 08/13/23 0326  NA  --  139 137 136 135 135  K  --  4.3 4.1 4.1 4.2 4.1  CL  --  105 105 103 102 98  CO2  --  26 26 26 24 31   GLUCOSE  --  113* 99 129* 126* 95  BUN  --  23 23 24* 24* 21  CREATININE  --  2.05* 2.08* 1.94* 1.72* 1.76*  CALCIUM   --  8.0* 7.7* 7.7* 7.7* 7.9*  MG 2.0 1.8  --   --   --   --   PHOS  --  3.8  --   --   --   --    GFR: Estimated Creatinine Clearance: 26.3 mL/min (A) (by C-G formula based on SCr of 1.76 mg/dL (H)). Liver Function Tests: Recent Labs  Lab 08/08/23 1645 08/09/23 0427 08/10/23 1524  AST 310* 318* 168*  ALT 194* 204* 167*  ALKPHOS 87 66 78  BILITOT 1.4* 1.2* 0.8  PROT 6.8 5.3* 5.2*  ALBUMIN  3.3* 2.6* 2.4*   No results for input(s): LIPASE, AMYLASE in the last 168 hours. No results for input(s): AMMONIA in the last 168 hours. Coagulation Profile: Recent Labs  Lab 08/09/23 0427  INR 1.5*   Cardiac Enzymes: No results for input(s): CKTOTAL, CKMB, CKMBINDEX, TROPONINI in the last 168 hours. BNP (last 3 results) No results for input(s): PROBNP in the last 8760 hours. HbA1C: No results for input(s): HGBA1C in the last 72 hours. CBG: No results for  input(s): GLUCAP in the last 168 hours. Lipid Profile: No results for input(s): CHOL, HDL, LDLCALC, TRIG, CHOLHDL, LDLDIRECT in the last 72 hours. Thyroid  Function Tests: No results for input(s): TSH, T4TOTAL, FREET4, T3FREE, THYROIDAB in the last 72 hours.  Anemia Panel: No results for input(s): VITAMINB12, FOLATE, FERRITIN, TIBC, IRON, RETICCTPCT in the last 72 hours.  Urine analysis:    Component Value Date/Time   COLORURINE YELLOW 08/09/2023 0742   APPEARANCEUR CLEAR 08/09/2023 0742   LABSPEC 1.009 08/09/2023 0742   PHURINE 5.0 08/09/2023 0742   GLUCOSEU NEGATIVE 08/09/2023 0742   HGBUR NEGATIVE 08/09/2023 0742   BILIRUBINUR NEGATIVE 08/09/2023 0742   BILIRUBINUR negative 06/04/2016 0847   BILIRUBINUR neg 02/14/2015 1431   KETONESUR NEGATIVE 08/09/2023 0742   PROTEINUR NEGATIVE 08/09/2023 0742   UROBILINOGEN 0.2 06/04/2016 0847   NITRITE NEGATIVE 08/09/2023 0742   LEUKOCYTESUR TRACE (A) 08/09/2023 0742   Sepsis Labs: @LABRCNTIP (procalcitonin:4,lacticidven:4)  )  Recent Results (from the past 240 hours)  Body fluid culture w Gram Stain     Status: None   Collection Time: 08/08/23  9:34 PM   Specimen: Pleural Fluid  Result Value Ref Range Status   Specimen Description FLUID PLEURAL  Final   Special Requests NONE  Final   Gram Stain NO WBC SEEN NO ORGANISMS SEEN   Final   Culture   Final    NO GROWTH 3 DAYS Performed at [facility], [address], [place], [place] [address]    Report Status 08/12/2023 FINAL  Final  Blood culture (routine x 2)     Status: None   Collection Time: 08/08/23  9:36 PM   Specimen: BLOOD RIGHT ARM  Result Value Ref Range Status   Specimen Description BLOOD RIGHT ARM  Final   Special Requests   Final    BOTTLES DRAWN AEROBIC AND ANAEROBIC Blood Culture adequate volume   Culture   Final    NO GROWTH 5 DAYS Performed at [facility], [address] [place], [place] [address]    Report  Status 08/13/2023 FINAL  Final  Blood culture (routine x 2)     Status: None   Collection Time: 08/08/23  9:36 PM   Specimen: BLOOD LEFT HAND  Result Value Ref Range Status   Specimen Description BLOOD LEFT HAND  Final   Special Requests   Final    BOTTLES DRAWN AEROBIC AND ANAEROBIC Blood Culture adequate volume   Culture   Final    NO GROWTH 5 DAYS Performed at [facility], [address], [place], [place] [address]    Report Status 08/13/2023 FINAL  Final     Radiology Studies: DG CHEST PORT 1 VIEW Result Date: 08/13/2023 CLINICAL DATA:  Pleural effusion. EXAM: PORTABLE CHEST 1 VIEW COMPARISON:  Chest x-ray dated August 11, 2023. FINDINGS: Unchanged left chest wall pacemaker cardiomegaly. Similar pulmonary vascular congestion with moderate to large left and small right pleural effusions. Unchanged lingular and lobe atelectasis. No pneumothorax. No acute osseous abnormality. IMPRESSION: 1. Similar congestive heart failure with moderate to large left and small right pleural effusions. Electronically Signed   By: Elsie ONEIDA Shoulder M.D.   On: 08/13/2023 10:07     Scheduled Meds:  amiodarone   200 mg Oral Daily   aspirin  EC  81 mg Oral Daily   bisacodyl   10 mg Rectal Once   enoxaparin  (LOVENOX ) injection  30 mg Subcutaneous Q24H   furosemide   80 mg Intravenous BID   imatinib   400 mg Oral Daily   metoprolol  succinate  12.5 mg Oral Daily   thyroid   15 mg Oral QAC breakfast   timolol   1 drop Left Eye BID   Continuous Infusions:   LOS: 5 days    Time spent:    Sigurd Pac, MD [facility]   08/13/2023, 11:49 AM

## 2023-08-13 NOTE — Progress Notes (Signed)
 Mobility Specialist Progress Note:    08/13/23 1539  Mobility  Activity Transferred from chair to bed  Level of Assistance Maximum assist, patient does 25-49% (+2)  Assistive Device Sliding board  Activity Response Tolerated fair  Mobility Referral Yes  Mobility visit 1 Mobility  Mobility Specialist Start Time (ACUTE ONLY) 1505  Mobility Specialist Stop Time (ACUTE ONLY) 1520  Mobility Specialist Time Calculation (min) (ACUTE ONLY) 15 min   Pt received in chair, RN requesting assistance getting pt back to bed. Attempted to get pt to stand and transfer but pt states she is too dizzy and prematurely sits needing correction from mobility specialist and RN. Pt was unable to stand again, slide board was then used. Pt needed maxA +2 to slide and transfer. Took x2 breaks d/t nausea and vomiting. Was able to get back in bed. Call bell and personal belongings in reach. All needs met RN in room.   Thersia Minder Mobility Specialist  Please contact vis Secure Chat or  Rehab Office [phone]

## 2023-08-13 NOTE — Progress Notes (Signed)
 Notified Dr. Jomarie Longs that patient is vomiting frequently since administration of Dilaudid, therefore, IR was not able to complete Thoracentesis. No further orders received at this time. MD states that procedure will be completed tomorrow.

## 2023-08-13 NOTE — Progress Notes (Signed)
 Patient was brought down to IR for thoracentesis, actively vomiting and asking for meds.  She refused to come down to radiology earlier today, unless she receives pain meds.   Unable to perform thoracentesis safely, patient to be transferred back to the floor.  Will check with RN tomorrow to see if patient can come down for thora tomorrow.  Please call IR for questions and concerns.    Kalyssa Anker H Margie Urbanowicz PA-C 08/13/2023 3:25 PM

## 2023-08-13 NOTE — Progress Notes (Signed)
 Physical Therapy Treatment Patient Details Name: Lisa [name] MRN: [id number] DOB: 1941-06-25 Today's Date: 08/13/2023   History of Present Illness Pt is an 83 year old woman admitted 10/08/2022 with suspected acute on chronic diastolic heart failure after presenting from home complaining of shortness of breath. PMH includes: + for pseudomonas, CHF exacerbation, L pleural effusion. 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    PT Comments  Patient resting in bed and reports recently nauseous and experienced 1 bout of emesis but agreeable to attempt OOB with therapist. Pt able to sit up to EOB with use of bed features, extra time, and CGA for safety. Pt steady with sit<>stand from EOB, recliner, and BSC during session always reliant on bil UE to power up and steady self with RW or armrest in standing. Pt amb 2x30' in room with RW and no buckling or LOB noted, VSS throughout with pt on RA. Pt positioned in recliner following gait then reported urge to void bowels. Pt taking extended time to attempt voiding with cues for breathing and positioning using trash can to elevate LE's and forward trunk lean to improve posture for void. Pt unsuccessful however did void bladder. EOS returned to recliner and repositioned for comfort. Will continue to progress pt as able during stay.    If plan is discharge home, recommend the following: A little help with walking and/or transfers;A little help with bathing/dressing/bathroom;Assistance with cooking/housework;Assist for transportation;Help with stairs or ramp for entrance   Can travel by private vehicle        Equipment Recommendations  None recommended by PT    Recommendations for Other Services       Precautions / Restrictions Precautions Precautions: Fall Restrictions Weight Bearing Restrictions Per Provider Order: No     Mobility  Bed Mobility Overal bed mobility: Needs Assistance Bed Mobility: Supine to Sit      Supine to sit: Contact guard, Used rails, HOB elevated     General bed mobility comments: pt reliant on bed features, CGA for safety and pt taking extra time.    Transfers Overall transfer level: Needs assistance Equipment used: Rolling walker (2 wheels), None Transfers: Sit to/from Stand, Bed to chair/wheelchair/BSC Sit to Stand: Contact guard assist   Step pivot transfers: Contact guard assist       General transfer comment: CGA from EOB, pt reliant on bil UE to power up. pt with wide BOS to stand. Pt compelted step pivot transfer from recliner<>BSC with no AD, use of hands to guide turn and steady. CGA for safety.    Ambulation/Gait Ambulation/Gait assistance: Contact guard assist Gait Distance (Feet): 30 Feet (2x30) Assistive device: Rolling walker (2 wheels) Gait Pattern/deviations: Step-through pattern, Decreased stride length, Wide base of support Gait velocity: decr     General Gait Details: CGA for safety with RW, pt with wide BOS to stabilize balance. pace slow and cautious, no LOB noted. pt compelted 2 bouts of ~30' in room. VSS.   Stairs             Wheelchair Mobility     Tilt Bed    Modified Rankin (Stroke Patients Only)       Balance Overall balance assessment: Needs assistance Sitting-balance support: Feet unsupported, Bilateral upper extremity supported Sitting balance-Leahy Scale: Good     Standing balance support: Bilateral upper extremity supported, During functional activity Standing balance-Leahy Scale: Fair Standing balance comment: able to stand pivot transfer with hands and no AD  Cognition Arousal: Alert Behavior During Therapy: WFL for tasks assessed/performed Overall Cognitive Status: Within Functional Limits for tasks assessed                                          Exercises      General Comments        Pertinent Vitals/Pain Pain Assessment Pain  Assessment: No/denies pain    Home Living                          Prior Function            PT Goals (current goals can now be found in the care plan section) Acute Rehab PT Goals Patient Stated Goal: get well go home PT Goal Formulation: With patient Time For Goal Achievement: 08/24/23 Potential to Achieve Goals: Good Progress towards PT goals: Progressing toward goals    Frequency    Min 1X/week      PT Plan      Co-evaluation              AM-PAC PT 6 Clicks Mobility   Outcome Measure  Help needed turning from your back to your side while in a flat bed without using bedrails?: None Help needed moving from lying on your back to sitting on the side of a flat bed without using bedrails?: A Little Help needed moving to and from a bed to a chair (including a wheelchair)?: A Little Help needed standing up from a chair using your arms (e.g., wheelchair or bedside chair)?: A Little Help needed to walk in hospital room?: A Little Help needed climbing 3-5 steps with a railing? : A Lot 6 Click Score: 18    End of Session Equipment Utilized During Treatment: Gait belt Activity Tolerance: Patient tolerated treatment well Patient left: in chair;with call bell/phone within reach Nurse Communication: Mobility status PT Visit Diagnosis: Difficulty in walking, not elsewhere classified (R26.2);Muscle weakness (generalized) (M62.81);Unsteadiness on feet (R26.81)     Time: 1117-1208 PT Time Calculation (min) (ACUTE ONLY): 51 min  Charges:    $Gait Training: 8-22 mins $Therapeutic Activity: 23-37 mins PT General Charges $$ ACUTE PT VISIT: 1 Visit                     Vernell DONEEN KLEIN, DPT Acute Rehabilitation Services Office [phone]  08/13/23 1:20 PM

## 2023-08-14 ENCOUNTER — Inpatient Hospital Stay (HOSPITAL_COMMUNITY): Payer: Medicare Other

## 2023-08-14 DIAGNOSIS — I5032 Chronic diastolic (congestive) heart failure: Secondary | ICD-10-CM

## 2023-08-14 LAB — BASIC METABOLIC PANEL
Anion gap: 8 (ref 5–15)
BUN: 23 mg/dL (ref 8–23)
CO2: 32 mmol/L (ref 22–32)
Calcium: 8.1 mg/dL — ABNORMAL LOW (ref 8.9–10.3)
Chloride: 98 mmol/L (ref 98–111)
Creatinine, Ser: 1.67 mg/dL — ABNORMAL HIGH (ref 0.44–1.00)
GFR, Estimated: 31 mL/min — ABNORMAL LOW (ref >60)
Glucose, Bld: 101 mg/dL — ABNORMAL HIGH (ref 70–99)
Potassium: 4.3 mmol/L (ref 3.5–5.1)
Sodium: 138 mmol/L (ref 135–145)

## 2023-08-14 LAB — BODY FLUID CELL COUNT WITH DIFFERENTIAL
Eos, Fluid: 0 %
Lymphs, Fluid: 50 %
Monocyte-Macrophage-Serous Fluid: 38 % — ABNORMAL LOW (ref 50–90)
Neutrophil Count, Fluid: 12 % (ref 0–25)
Total Nucleated Cell Count, Fluid: 207 uL (ref 0–1000)

## 2023-08-14 LAB — PROTEIN, PLEURAL OR PERITONEAL FLUID: Total protein, fluid: 3.4 g/dL

## 2023-08-14 LAB — GRAM STAIN

## 2023-08-14 LAB — LACTATE DEHYDROGENASE, PLEURAL OR PERITONEAL FLUID: LD, Fluid: 152 U/L — ABNORMAL HIGH (ref 3–23)

## 2023-08-14 MED ORDER — BISACODYL 10 MG RE SUPP: 10.0000 mg | Freq: Once | RECTAL | Status: DC

## 2023-08-14 MED ORDER — POLYETHYLENE GLYCOL 3350 17 G PO PACK
17.0000 g | PACK | Freq: Two times a day (BID) | ORAL | Status: DC
  Administered 2023-08-14 – 2023-08-17 (×7): 17 g via ORAL
  Filled 2023-08-14 (×8): qty 1

## 2023-08-14 MED ORDER — FENTANYL CITRATE PF 50 MCG/ML IJ SOSY
25.0000 ug | PREFILLED_SYRINGE | Freq: Once | INTRAMUSCULAR | Status: AC
  Administered 2023-08-14: 25 ug via INTRAVENOUS
  Filled 2023-08-14: qty 1

## 2023-08-14 MED ORDER — LIDOCAINE HCL (PF) 1 % IJ SOLN
5.0000 mL | Freq: Once | INTRAMUSCULAR | Status: AC
  Administered 2023-08-14: 5 mL via INTRADERMAL

## 2023-08-14 NOTE — Procedures (Signed)
 PROCEDURE SUMMARY:  Successful US  guided right thoracentesis. Yielded 720 mL of yellow, clear fluid. Pt tolerated procedure well. No immediate complications.  Specimen was sent for labs. CXR ordered.  EBL < 5 mL  Solmon Selmer Ku PA-C 08/14/2023 12:54 PM

## 2023-08-14 NOTE — Progress Notes (Signed)
 Rounding Note    Patient Name: Lisa [name] Date of Encounter: 08/14/2023  [facility] Cardiologist: Vina Gull, MD   Subjective   SOB improving.   Inpatient Medications    Scheduled Meds:  amiodarone   200 mg Oral Daily   aspirin  EC  81 mg Oral Daily   bisacodyl   10 mg Rectal Once   enoxaparin  (LOVENOX ) injection  30 mg Subcutaneous Q24H   fentaNYL  (SUBLIMAZE ) injection  25 mcg Intravenous Once   furosemide   80 mg Intravenous BID   imatinib   400 mg Oral Daily   metoprolol  succinate  12.5 mg Oral Daily   thyroid   15 mg Oral QAC breakfast   timolol   1 drop Left Eye BID   Continuous Infusions:  PRN Meds: acetaminophen  **OR** acetaminophen , melatonin, ondansetron  (ZOFRAN ) IV, mouth rinse, prochlorperazine    Vital Signs    Vitals:   08/13/23 2044 08/14/23 0411 08/14/23 0500 08/14/23 0837  BP: (!) 126/57 (!) 119/50  (!) 116/44  Pulse: 65 63  68  Resp: 19 (!) 24  16  Temp: 97.9 F (36.6 C) 97.8 F (36.6 C)  98 F (36.7 C)  TempSrc: Oral Oral  Oral  SpO2:  100%  98%  Weight:   86.3 kg   Height:        Intake/Output Summary (Last 24 hours) at 08/14/2023 1022 Last data filed at 08/14/2023 0800 Gross per 24 hour  Intake 415 ml  Output 1600 ml  Net -1185 ml      08/14/2023    5:00 AM 08/13/2023    3:11 AM 08/12/2023   11:38 PM  Last 3 Weights  Weight (lbs) 190 lb 4.1 oz 207 lb 14.3 oz 207 lb 14.3 oz  Weight (kg) 86.3 kg 94.3 kg 94.3 kg      Telemetry    V paced - Personally Reviewed  ECG    N/a - Personally Reviewed  Physical Exam   GEN: No acute distress.   Neck: No JVD Cardiac: RRR, no murmurs, rubs, or gallops.  Respiratory: Crackles bilateral bases, decreased breath sounds GI: Soft, nontender, non-distended  MS: 1+  bilateral edema Neuro:  Nonfocal  Psych: Normal affect   Labs    High Sensitivity Troponin:   Recent Labs  Lab 07/17/23 1210 07/17/23 1546 08/08/23 1645 08/08/23 1927 08/09/23 0427  TROPONINIHS 26* 53* 39* 46*  53*     Chemistry Recent Labs  Lab 08/08/23 1645 08/08/23 1708 08/08/23 1927 08/09/23 0427 08/10/23 0338 08/10/23 1524 08/11/23 0341 08/12/23 0247 08/13/23 0326 08/14/23 0237  NA 135   < >  --  139   < >  --    < > 135 135 138  K 4.3   < >  --  4.3   < >  --    < > 4.2 4.1 4.3  CL 101   < >  --  105   < >  --    < > 102 98 98  CO2 23  --   --  26   < >  --    < > 24 31 32  GLUCOSE 149*   < >  --  113*   < >  --    < > 126* 95 101*  BUN 22   < >  --  23   < >  --    < > 24* 21 23  CREATININE 2.06*   < >  --  2.05*   < >  --    < >  1.72* 1.76* 1.67*  CALCIUM  8.4*  --   --  8.0*   < >  --    < > 7.7* 7.9* 8.1*  MG  --   --  2.0 1.8  --   --   --   --   --   --   PROT 6.8  --   --  5.3*  --  5.2*  --   --   --   --   ALBUMIN  3.3*  --   --  2.6*  --  2.4*  --   --   --   --   AST 310*  --   --  318*  --  168*  --   --   --   --   ALT 194*  --   --  204*  --  167*  --   --   --   --   ALKPHOS 87  --   --  66  --  78  --   --   --   --   BILITOT 1.4*  --   --  1.2*  --  0.8  --   --   --   --   GFRNONAA 24*  --   --  24*   < >  --    < > 30* 29* 31*  ANIONGAP 11  --   --  8   < >  --    < > 9 6 8    < > = values in this interval not displayed.    Lipids No results for input(s): CHOL, TRIG, HDL, LABVLDL, LDLCALC, CHOLHDL in the last 168 hours.  Hematology Recent Labs  Lab 08/11/23 0341 08/12/23 0247 08/13/23 0326  WBC 5.8 5.0 4.9  RBC 2.47* 2.57* 2.60*  HGB 7.7* 8.0* 8.2*  HCT 24.2* 26.0* 25.6*  MCV 98.0 101.2* 98.5  MCH 31.2 31.1 31.5  MCHC 31.8 30.8 32.0  RDW 16.1* 16.4* 16.7*  PLT 196 137* 203   Thyroid   Recent Labs  Lab 08/08/23 1927  TSH 2.287    BNP Recent Labs  Lab 08/08/23 1645  BNP 804.7*    DDimer No results for input(s): DDIMER in the last 168 hours.   Radiology    DG CHEST PORT 1 VIEW Result Date: 08/13/2023 CLINICAL DATA:  Pleural effusion. EXAM: PORTABLE CHEST 1 VIEW COMPARISON:  Chest x-ray dated August 11, 2023. FINDINGS:  Unchanged left chest wall pacemaker cardiomegaly. Similar pulmonary vascular congestion with moderate to large left and small right pleural effusions. Unchanged lingular and lobe atelectasis. No pneumothorax. No acute osseous abnormality. IMPRESSION: 1. Similar congestive heart failure with moderate to large left and small right pleural effusions. Electronically Signed   By: Elsie ONEIDA Shoulder M.D.   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 Likely non-ischemic/mixed etiology  Last LHC (09/2019) showed non-obstructive CAD with patent LAD stent  - Echo (08/09/23) showed LVEF 55-60%, no regional wall motion abnormalities, grade I diastolic dysfunction, normal RV, large pleural effusion, IVC dilated   -mixed compliance with diuretic at home - incomplete I/Os. Unclear accuracy of weights reporting 17 lbs weight loss. She is on IV lasix  80mg  bid, dowtrending Cr with diuresis  consistent with venous congestion and HF. Someonging fluid overload, continue IV lasix  today.  - medical therapy with toprol  12.5mg  daily   2. Pleural effusion - She had a bedside thoracentesis 08/08/23 in the ED with 900 cc output, transudative, cytology showed reactive mesothelial cells present, negative for malignancy   3. 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

## 2023-08-14 NOTE — Progress Notes (Signed)
 PROGRESS NOTE    ROSENDA GEFFRARD  FMW:[id number] DOB: 10-25-1940 DOA: 08/08/2023 PCP: Claudene Thersia Reusing, DO  82/F with CAD, chronic diastolic CHF, CML on Gleevec , chronic nausea and vomiting, chronic anemia, paroxysmal ventricular tachycardia, hypothyroidism, CKD 3B, recently hospitalized 12/7-12/11 with diastolic CHF complicated by left pleural effusion, diuresed and underwent left thoracentesis on 12/10, 900 mL of transudative fluid was drained. -Back in the ED 12/29, reported 2 to 3 days of progressive dyspnea on exertion with cough orthopnea and nausea vomiting. -In the ED hypertensive, hypoxic, underwent left thoracentesis by EDP 12/29, 1.1 mL of pleural fluid drained, labs noted creatinine 2.0, sodium 135, mildly elevated LFTs, BNP 804, troponin 39, 46, WBC 8.6, hemoglobin 9.8.  CT chest abdomen pelvis demonstrated large left pleural effusion, moderate right pleural effusion, small ascites, liver nodularity concerning for cirrhosis -Admitted, started on diuretics, cards following   Subjective: -Unable to tolerate thoracentesis yesterday, vomited after Dilaudid  downstairs, procedure deferred, some shortness of breath yesterday,  Assessment and Plan:  Acute on chronic diastolic CHF Recurrent pleural effusions left> right -Last echo 12/24 with EF 60-65%, normal RV, moderate pericardial effusion -Recent admission for same, baseline on torsemide  20 Mg daily with poor compliance -Recent admit with >L thoracentesis 12/10, 900 mL of transudative fluid, cytology negative -Repeat L thoracentesis in ED 12/30, 1.1 L drained, again transudative, despite pleural fluid LDH being high, serum LDH is considerably elevated from CML, cytology is negative -Slowly diuresing, weight down 22 LB, urine output inaccurate , chest x-ray with worsening left effusion few days ago, for repeat thoracentesis in radiology today  will repeat fluid analysis and cytology -Suspect cirrhosis and hypoalbuminemia  contributing to recurrent effusions -GDMT limited by CKD -Increase activity, PT OT eval  Cirrhosis, noted on imaging CT abdomen 12/29 Hypoalbuminemia -Contributing to fluid overloaded state -Diuretics as above  Acute hypoxic respiratory failure -Secondary to diastolic CHF, pleural effusions left> right -As above, wean O2 -See above  Acute on chronic anemia History of CML, on Gleevec  -Anemia panel suggestive of chronic disease -Hemoglobin relatively stable in the 7.5- 9 range, multifactorial from chronic kidney disease, CML & Gleevec , transfuse if Hb trends down further  History of chronic nausea and vomiting -I suspect this is multifactorial, very common side effect of Gleevec , also suspect a posttussive component to her vomiting -Supportive care,   Mechanical fall, generalized weakness -Likely secondary to dyspnea and hypoxia -Imaging unremarkable for injuries, PT OT consult  AKI on CKD 3B,  -baseline creatinine around 1.6-2 -Relatively stable, monitor with diuresis  Abnormal LFTs -Could be related to passive hepatic congestion, Gleevac  History of ventricular tachycardia On amiodarone   Hypothyroidism Continue Synthroid  DVT prophylaxis: lovenox  Code Status: Full Code Family Communication: None present Disposition Plan: Home with home health services  Consultants:    Procedures:   Antimicrobials:    Objective: Vitals:   08/13/23 2044 08/14/23 0411 08/14/23 0500 08/14/23 0837  BP: (!) 126/57 (!) 119/50  (!) 116/44  Pulse: 65 63  68  Resp: 19 (!) 24  16  Temp: 97.9 F (36.6 C) 97.8 F (36.6 C)  98 F (36.7 C)  TempSrc: Oral Oral  Oral  SpO2:  100%  98%  Weight:   86.3 kg   Height:        Intake/Output Summary (Last 24 hours) at 08/14/2023 1122 Last data filed at 08/14/2023 0800 Gross per 24 hour  Intake 415 ml  Output 1000 ml  Net -585 ml   Filed Weights   08/12/23  2338 08/13/23 0311 08/14/23 0500  Weight: 94.3 kg 94.3 kg 86.3 kg     Examination:  General exam: Obese chronically ill female sitting up in bed, AAOx3 HEENT: Positive JVD VS: S1-S2, regular rhythm Lungs with decreased breath sounds on the left Abdomen: Soft, nontender, bowel sounds present Extremities: Trace edema, chronic skin changes and deformities  Psychiatry:  Mood & affect appropriate.     Data Reviewed:   CBC: Recent Labs  Lab 08/08/23 1645 08/08/23 1708 08/09/23 0427 08/10/23 0338 08/11/23 0341 08/12/23 0247 08/13/23 0326  WBC 8.6  --  6.9 5.1 5.8 5.0 4.9  NEUTROABS 5.8  --  5.5  --   --   --   --   HGB 9.8*   < > 8.1* 7.5* 7.7* 8.0* 8.2*  HCT 32.1*   < > 25.8* 23.3* 24.2* 26.0* 25.6*  MCV 104.6*  --  100.8* 98.7 98.0 101.2* 98.5  PLT 223  --  158 168 196 137* 203   < > = values in this interval not displayed.   Basic Metabolic Panel: Recent Labs  Lab 08/08/23 1927 08/09/23 0427 08/10/23 0338 08/11/23 0341 08/12/23 0247 08/13/23 0326 08/14/23 0237  NA  --  139 137 136 135 135 138  K  --  4.3 4.1 4.1 4.2 4.1 4.3  CL  --  105 105 103 102 98 98  CO2  --  26 26 26 24 31  32  GLUCOSE  --  113* 99 129* 126* 95 101*  BUN  --  23 23 24* 24* 21 23  CREATININE  --  2.05* 2.08* 1.94* 1.72* 1.76* 1.67*  CALCIUM   --  8.0* 7.7* 7.7* 7.7* 7.9* 8.1*  MG 2.0 1.8  --   --   --   --   --   PHOS  --  3.8  --   --   --   --   --    GFR: Estimated Creatinine Clearance: 26.4 mL/min (A) (by C-G formula based on SCr of 1.67 mg/dL (H)). Liver Function Tests: Recent Labs  Lab 08/08/23 1645 08/09/23 0427 08/10/23 1524  AST 310* 318* 168*  ALT 194* 204* 167*  ALKPHOS 87 66 78  BILITOT 1.4* 1.2* 0.8  PROT 6.8 5.3* 5.2*  ALBUMIN  3.3* 2.6* 2.4*   No results for input(s): LIPASE, AMYLASE in the last 168 hours. No results for input(s): AMMONIA in the last 168 hours. Coagulation Profile: Recent Labs  Lab 08/09/23 0427  INR 1.5*   Cardiac Enzymes: No results for input(s): CKTOTAL, CKMB, CKMBINDEX, TROPONINI in the  last 168 hours. BNP (last 3 results) No results for input(s): PROBNP in the last 8760 hours. HbA1C: No results for input(s): HGBA1C in the last 72 hours. CBG: No results for input(s): GLUCAP in the last 168 hours. Lipid Profile: No results for input(s): CHOL, HDL, LDLCALC, TRIG, CHOLHDL, LDLDIRECT in the last 72 hours. Thyroid  Function Tests: No results for input(s): TSH, T4TOTAL, FREET4, T3FREE, THYROIDAB in the last 72 hours.  Anemia Panel: No results for input(s): VITAMINB12, FOLATE, FERRITIN, TIBC, IRON, RETICCTPCT in the last 72 hours.  Urine analysis:    Component Value Date/Time   COLORURINE YELLOW 08/09/2023 0742   APPEARANCEUR CLEAR 08/09/2023 0742   LABSPEC 1.009 08/09/2023 0742   PHURINE 5.0 08/09/2023 0742   GLUCOSEU NEGATIVE 08/09/2023 0742   HGBUR NEGATIVE 08/09/2023 0742   BILIRUBINUR NEGATIVE 08/09/2023 0742   BILIRUBINUR negative 06/04/2016 0847   BILIRUBINUR neg 02/14/2015 1431   KETONESUR NEGATIVE  08/09/2023 0742   PROTEINUR NEGATIVE 08/09/2023 0742   UROBILINOGEN 0.2 06/04/2016 0847   NITRITE NEGATIVE 08/09/2023 0742   LEUKOCYTESUR TRACE (A) 08/09/2023 0742   Sepsis Labs: @LABRCNTIP (procalcitonin:4,lacticidven:4)  ) Recent Results (from the past 240 hours)  Body fluid culture w Gram Stain     Status: None   Collection Time: 08/08/23  9:34 PM   Specimen: Pleural Fluid  Result Value Ref Range Status   Specimen Description FLUID PLEURAL  Final   Special Requests NONE  Final   Gram Stain NO WBC SEEN NO ORGANISMS SEEN   Final   Culture   Final    NO GROWTH 3 DAYS Performed at [facility], [address], [place], [place] [address]    Report Status 08/12/2023 FINAL  Final  Blood culture (routine x 2)     Status: None   Collection Time: 08/08/23  9:36 PM   Specimen: BLOOD RIGHT ARM  Result Value Ref Range Status   Specimen Description BLOOD RIGHT ARM  Final   Special Requests   Final    BOTTLES DRAWN  AEROBIC AND ANAEROBIC Blood Culture adequate volume   Culture   Final    NO GROWTH 5 DAYS Performed at [facility], [address] [place], [place] [address]    Report Status 08/13/2023 FINAL  Final  Blood culture (routine x 2)     Status: None   Collection Time: 08/08/23  9:36 PM   Specimen: BLOOD LEFT HAND  Result Value Ref Range Status   Specimen Description BLOOD LEFT HAND  Final   Special Requests   Final    BOTTLES DRAWN AEROBIC AND ANAEROBIC Blood Culture adequate volume   Culture   Final    NO GROWTH 5 DAYS Performed at [facility], [address] [place], [place] [address]    Report Status 08/13/2023 FINAL  Final     Radiology Studies: DG CHEST PORT 1 VIEW Result Date: 08/13/2023 CLINICAL DATA:  Pleural effusion. EXAM: PORTABLE CHEST 1 VIEW COMPARISON:  Chest x-ray dated August 11, 2023. FINDINGS: Unchanged left chest wall pacemaker cardiomegaly. Similar pulmonary vascular congestion with moderate to large left and small right pleural effusions. Unchanged lingular and lobe atelectasis. No pneumothorax. No acute osseous abnormality. IMPRESSION: 1. Similar congestive heart failure with moderate to large left and small right pleural effusions. Electronically Signed   By: Elsie ONEIDA Shoulder M.D.   On: 08/13/2023 10:07     Scheduled Meds:  amiodarone   200 mg Oral Daily   aspirin  EC  81 mg Oral Daily   bisacodyl   10 mg Rectal Once   enoxaparin  (LOVENOX ) injection  30 mg Subcutaneous Q24H   fentaNYL  (SUBLIMAZE ) injection  25 mcg Intravenous Once   furosemide   80 mg Intravenous BID   imatinib   400 mg Oral Daily   metoprolol  succinate  12.5 mg Oral Daily   thyroid   15 mg Oral QAC breakfast   timolol   1 drop Left Eye BID   Continuous Infusions:   LOS: 6 days    Time spent:    Sigurd Pac, MD [facility]   08/14/2023, 11:22 AM

## 2023-08-14 NOTE — Plan of Care (Signed)
 Pt progressing on all care plans. Pt received thoracentesis today w/ of fluid removed. No N/V. Able to wean O2 off today; pt currently on RA w/ O2 sats >92%. Plan of care discussed with patient and all questions and concerns addressed.

## 2023-08-14 NOTE — Plan of Care (Signed)
 Pt vital signs in normal range

## 2023-08-15 DIAGNOSIS — I5032 Chronic diastolic (congestive) heart failure: Secondary | ICD-10-CM | POA: Diagnosis not present

## 2023-08-15 LAB — CBC
HCT: 24.5 % — ABNORMAL LOW (ref 36.0–46.0)
Hemoglobin: 8 g/dL — ABNORMAL LOW (ref 12.0–15.0)
MCH: 32 pg (ref 26.0–34.0)
MCHC: 32.7 g/dL (ref 30.0–36.0)
MCV: 98 fL (ref 80.0–100.0)
Platelets: 201 10*3/uL (ref 150–400)
RBC: 2.5 MIL/uL — ABNORMAL LOW (ref 3.87–5.11)
RDW: 16.9 % — ABNORMAL HIGH (ref 11.5–15.5)
WBC: 5.2 10*3/uL (ref 4.0–10.5)
nRBC: 0 % (ref 0.0–0.2)

## 2023-08-15 LAB — BASIC METABOLIC PANEL
Anion gap: 9 (ref 5–15)
BUN: 23 mg/dL (ref 8–23)
CO2: 31 mmol/L (ref 22–32)
Calcium: 8.2 mg/dL — ABNORMAL LOW (ref 8.9–10.3)
Chloride: 97 mmol/L — ABNORMAL LOW (ref 98–111)
Creatinine, Ser: 1.76 mg/dL — ABNORMAL HIGH (ref 0.44–1.00)
GFR, Estimated: 29 mL/min — ABNORMAL LOW (ref >60)
Glucose, Bld: 107 mg/dL — ABNORMAL HIGH (ref 70–99)
Potassium: 4 mmol/L (ref 3.5–5.1)
Sodium: 137 mmol/L (ref 135–145)

## 2023-08-15 MED ORDER — METOLAZONE 5 MG PO TABS
5.0000 mg | ORAL_TABLET | Freq: Once | ORAL | Status: AC
  Administered 2023-08-15: 5 mg via ORAL
  Filled 2023-08-15: qty 1

## 2023-08-15 NOTE — Progress Notes (Signed)
 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 -Back in the ED 12/29, reported 2 to 3 days of progressive dyspnea on exertion with cough orthopnea and nausea vomiting. -In the ED hypertensive, hypoxic, underwent left thoracentesis by EDP 12/29, 1.1 mL of pleural fluid drained, labs noted creatinine 2.0, sodium 135, mildly elevated LFTs, BNP 804, troponin 39, 46, WBC 8.6, hemoglobin 9.8.  CT chest abdomen pelvis demonstrated large left pleural effusion, moderate right pleural effusion, small ascites, liver nodularity concerning for cirrhosis -Admitted, started on diuretics, cards following -Attempted repeat thoracentesis in IR 1/4, 750 mL drained, procedure had to be stopped on account of symptoms, perIR PA at least 2 L present   Subjective: -Feels better overall, breathing is not back to baseline  Assessment and Plan:  Acute on chronic diastolic CHF Recurrent pleural effusions left> right -Last echo 12/24 with EF 60-65%, normal RV, moderate pericardial effusion -Recent admission for same, baseline on torsemide  20 Mg daily with poor compliance, underwent left thoracentesis on prior admission 12/10, cytology negative -Repeat L thoracentesis in ED 12/30, 1.1 L drained, again transudative, despite pleural fluid LDH being high, serum LDH is considerably elevated from CML, cytology is negative -Slowly diuresing, weight down 22 LB, urine output inaccurate , repeat thoracentesis yesterday, LDH ratio suggests exudative fluid will repeat fluid analysis and cytology -Continue IV Lasix , add  metolazone  -Suspect cirrhosis and hypoalbuminemia contributing to recurrent effusions -GDMT limited by CKD -Increase activity, PT OT eval  Cirrhosis, noted on imaging CT abdomen 12/29 Hypoalbuminemia -Contributing to fluid overloaded state -Diuretics as above  Acute hypoxic respiratory failure -Secondary to diastolic CHF, pleural effusions left> right -As above, wean O2 -See above  Acute on chronic anemia History of CML, on Gleevec  -Anemia panel suggestive of chronic disease -Hemoglobin relatively stable in the 7.5- 9 range, multifactorial from chronic kidney disease, CML & Gleevec , transfuse if Hb trends down further  History of chronic nausea and vomiting -I suspect this is multifactorial, very common side effect of Gleevec , also suspect a posttussive component to her vomiting -Supportive care,   Mechanical fall, generalized weakness -Likely secondary to dyspnea and hypoxia -Imaging unremarkable for injuries, PT OT consult  AKI on CKD 3B,  -baseline creatinine around 1.6-2 -Relatively stable, monitor with diuresis  Abnormal LFTs -Could be related to passive hepatic congestion, Gleevac  History of ventricular tachycardia On amiodarone   Hypothyroidism Continue Synthroid  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, needs further follow-up  DVT prophylaxis: lovenox  Code Status: Full Code Family Communication: None present Disposition Plan: Home with home health services  Consultants:    Procedures:   Antimicrobials:    Objective: Vitals:   08/14/23 2010 08/14/23 2344 08/15/23 0500 08/15/23 0745  BP: (!) 116/48  (!) 107/43 (!) 109/53  Pulse:   67 69  Resp: 20  12 20   Temp: 98.1 F (36.7 C) 98 F (36.7 C) 98.1 F (36.7 C) 98.4 F (36.9 C)  TempSrc: Oral Oral Oral Oral  SpO2:   96% 92%  Weight:   86.2 kg   Height:        Intake/Output Summary (Last 24 hours) at 08/15/2023 1019 Last data filed at 08/15/2023 9258  Gross per 24 hour  Intake 617 ml  Output 150 ml   Net 467 ml   Filed Weights   08/13/23 0311 08/14/23 0500 08/15/23 0500  Weight: 94.3 kg 86.3 kg 86.2 kg    Examination:  General exam: Obese chronically ill female sitting up in bed, AAOx3 HEENT: Positive JVD VS: S1-S2, regular rhythm Lungs with decreased breath sounds on the left Abdomen: Soft, nontender, bowel sounds present Extremities: Trace edema, chronic skin changes and deformities  Psychiatry:  Mood & affect appropriate.     Data Reviewed:   CBC: Recent Labs  Lab 08/08/23 1645 08/08/23 1708 08/09/23 0427 08/10/23 0338 08/11/23 0341 08/12/23 0247 08/13/23 0326 08/15/23 0257  WBC 8.6  --  6.9 5.1 5.8 5.0 4.9 5.2  NEUTROABS 5.8  --  5.5  --   --   --   --   --   HGB 9.8*   < > 8.1* 7.5* 7.7* 8.0* 8.2* 8.0*  HCT 32.1*   < > 25.8* 23.3* 24.2* 26.0* 25.6* 24.5*  MCV 104.6*  --  100.8* 98.7 98.0 101.2* 98.5 98.0  PLT 223  --  158 168 196 137* 203 201   < > = values in this interval not displayed.   Basic Metabolic Panel: Recent Labs  Lab 08/08/23 1927 08/09/23 0427 08/10/23 0338 08/11/23 0341 08/12/23 0247 08/13/23 0326 08/14/23 0237 08/15/23 0257  NA  --  139   < > 136 135 135 138 137  K  --  4.3   < > 4.1 4.2 4.1 4.3 4.0  CL  --  105   < > 103 102 98 98 97*  CO2  --  26   < > 26 24 31  32 31  GLUCOSE  --  113*   < > 129* 126* 95 101* 107*  BUN  --  23   < > 24* 24* 21 23 23   CREATININE  --  2.05*   < > 1.94* 1.72* 1.76* 1.67* 1.76*  CALCIUM   --  8.0*   < > 7.7* 7.7* 7.9* 8.1* 8.2*  MG 2.0 1.8  --   --   --   --   --   --   PHOS  --  3.8  --   --   --   --   --   --    < > = values in this interval not displayed.   GFR: Estimated Creatinine Clearance: 25 mL/min (A) (by C-G formula based on SCr of 1.76 mg/dL (H)). Liver Function Tests: Recent Labs  Lab 08/08/23 1645 08/09/23 0427 08/10/23 1524  AST 310* 318* 168*  ALT 194* 204* 167*  ALKPHOS 87 66 78  BILITOT 1.4* 1.2* 0.8  PROT 6.8 5.3* 5.2*  ALBUMIN  3.3* 2.6* 2.4*   No results for  input(s): LIPASE, AMYLASE in the last 168 hours. No results for input(s): AMMONIA in the last 168 hours. Coagulation Profile: Recent Labs  Lab 08/09/23 0427  INR 1.5*   Cardiac Enzymes: No results for input(s): CKTOTAL, CKMB, CKMBINDEX, TROPONINI in the last 168 hours. BNP (last 3 results) No results for input(s): PROBNP in the last 8760 hours. HbA1C: No results for input(s): HGBA1C in the last 72 hours. CBG: No results for input(s): GLUCAP in the last 168 hours. Lipid Profile: No results for input(s): CHOL, HDL, LDLCALC, TRIG, CHOLHDL, LDLDIRECT in the last 72 hours. Thyroid  Function Tests: No results for input(s): TSH, T4TOTAL, FREET4, T3FREE, THYROIDAB in the last 72 hours.  Anemia Panel: No results for input(s): VITAMINB12, FOLATE, FERRITIN, TIBC, IRON, RETICCTPCT in the last 72 hours.  Urine analysis:    Component Value Date/Time   COLORURINE YELLOW 08/09/2023 0742   APPEARANCEUR CLEAR 08/09/2023 0742   LABSPEC 1.009 08/09/2023 0742   PHURINE 5.0 08/09/2023 0742   GLUCOSEU NEGATIVE 08/09/2023 0742   HGBUR NEGATIVE 08/09/2023 0742   BILIRUBINUR NEGATIVE 08/09/2023 0742   BILIRUBINUR negative 06/04/2016 0847   BILIRUBINUR neg 02/14/2015 1431   KETONESUR NEGATIVE 08/09/2023 0742   PROTEINUR NEGATIVE 08/09/2023 0742   UROBILINOGEN 0.2 06/04/2016 0847   NITRITE NEGATIVE 08/09/2023 0742   LEUKOCYTESUR TRACE (A) 08/09/2023 0742   Sepsis Labs: @LABRCNTIP (procalcitonin:4,lacticidven:4)  ) Recent Results (from the past 240 hours)  Body fluid culture w Gram Stain     Status: None   Collection Time: 08/08/23  9:34 PM   Specimen: Pleural Fluid  Result Value Ref Range Status   Specimen Description FLUID PLEURAL  Final   Special Requests NONE  Final   Gram Stain NO WBC SEEN NO ORGANISMS SEEN   Final   Culture   Final    NO GROWTH 3 DAYS Performed at [facility], [address] [place], [place] [address]     Report Status 08/12/2023 FINAL  Final  Blood culture (routine x 2)     Status: None   Collection Time: 08/08/23  9:36 PM   Specimen: BLOOD RIGHT ARM  Result Value Ref Range Status   Specimen Description BLOOD RIGHT ARM  Final   Special Requests   Final    BOTTLES DRAWN AEROBIC AND ANAEROBIC Blood Culture adequate volume   Culture   Final    NO GROWTH 5 DAYS Performed at [facility], [address] [place], [place] [address]    Report Status 08/13/2023 FINAL  Final  Blood culture (routine x 2)     Status: None   Collection Time: 08/08/23  9:36 PM   Specimen: BLOOD LEFT HAND  Result Value Ref Range Status   Specimen Description BLOOD LEFT HAND  Final   Special Requests   Final    BOTTLES DRAWN AEROBIC AND ANAEROBIC Blood Culture adequate volume   Culture   Final    NO GROWTH 5 DAYS Performed at [facility], [address], [place], [place] [address]    Report Status 08/13/2023 FINAL  Final  Culture, body fluid w Gram Stain-bottle     Status: None (Preliminary result)   Collection Time: 08/14/23  1:08 PM   Specimen: Pleura  Result Value Ref Range Status   Specimen Description PLEURAL  Final   Special Requests NONE  Final   Culture   Final    NO GROWTH < 24 HOURS Performed at [facility], [address], [place], [place] [address]    Report Status PENDING  Incomplete  Gram stain     Status: None   Collection Time: 08/14/23  1:08 PM   Specimen: Pleura  Result Value Ref Range Status   Specimen Description PLEURAL  Final   Special Requests NONE  Final   Gram Stain   Final    WBC PRESENT, PREDOMINANTLY MONONUCLEAR NO ORGANISMS SEEN CYTOSPIN SMEAR Performed at [facility], [address] [place], [place] [address]    Report Status 08/14/2023 FINAL  Final     Radiology Studies: US  THORACENTESIS ASP PLEURAL SPACE W/IMG GUIDE Result Date: 08/14/2023 INDICATION: 83 year old female with recurrent left pleural  effusion. Request for left  thoracentesis. EXAM: ULTRASOUND GUIDED LEFT THORACENTESIS MEDICATIONS: 10 mL 1% lidocaine . COMPLICATIONS: None immediate. PROCEDURE: An ultrasound guided thoracentesis was thoroughly discussed with the patient and questions answered. The benefits, risks, alternatives and complications were also discussed. The patient understands and wishes to proceed with the procedure. Written consent was obtained. Ultrasound was performed to localize and mark an adequate pocket of fluid in the left chest. The area was then prepped and draped in the normal sterile fashion. 1% Lidocaine  was used for local anesthesia. Under ultrasound guidance a 6 Fr Safe-T-Centesis catheter was introduced. Thoracentesis was performed. The catheter was removed and a dressing applied. FINDINGS: A total of approximately 700 mL of clear, yellow fluid was removed. Samples were sent to the laboratory as requested by the clinical team. IMPRESSION: Successful ultrasound guided left thoracentesis yielding 700 mL of pleural fluid. Performed by: Kacie Matthews PA-C Electronically Signed   By: Ester Sides M.D.   On: 08/14/2023 16:12   DG Chest 1 View Result Date: 08/14/2023 CLINICAL DATA:  Status post left thoracentesis. EXAM: CHEST  1 VIEW COMPARISON:  X-ray preprocedure 08/13/2023. FINDINGS: Slight decreasing left pleural effusion with significant residual. No pneumothorax. Right lung is grossly clear. Stable cardiopericardial silhouette with calcified aorta. Left upper chest defibrillator. Overlapping cardiac leads. Film is under penetrated. IMPRESSION: Decreasing left pleural effusion post thoracentesis with significant residual. No pneumothorax. Electronically Signed   By: Ranell Bring M.D.   On: 08/14/2023 13:32     Scheduled Meds:  amiodarone   200 mg Oral Daily   aspirin  EC  81 mg Oral Daily   bisacodyl   10 mg Rectal Once   enoxaparin  (LOVENOX ) injection  30 mg Subcutaneous Q24H   furosemide   80 mg Intravenous BID   imatinib   400 mg Oral  Daily   metolazone   5 mg Oral Once   metoprolol  succinate  12.5 mg Oral Daily   polyethylene glycol  17 g Oral BID   thyroid   15 mg Oral QAC breakfast   timolol   1 drop Left Eye BID   Continuous Infusions:   LOS: 7 days    Time spent:    Sigurd Pac, MD [facility]   08/15/2023, 10:19 AM

## 2023-08-15 NOTE — Plan of Care (Signed)

## 2023-08-15 NOTE — Progress Notes (Signed)
 Rounding Note    Patient Name: Lisa [name] Date of Encounter: 08/15/2023  [facility] Cardiologist: Vina Gull, MD   Subjective   Some ongoing SOB  Inpatient Medications    Scheduled Meds:  amiodarone   200 mg Oral Daily   aspirin  EC  81 mg Oral Daily   bisacodyl   10 mg Rectal Once   enoxaparin  (LOVENOX ) injection  30 mg Subcutaneous Q24H   furosemide   80 mg Intravenous BID   imatinib   400 mg Oral Daily   metoprolol  succinate  12.5 mg Oral Daily   polyethylene glycol  17 g Oral BID   thyroid   15 mg Oral QAC breakfast   timolol   1 drop Left Eye BID   Continuous Infusions:  PRN Meds: acetaminophen  **OR** acetaminophen , melatonin, ondansetron  (ZOFRAN ) IV, mouth rinse, prochlorperazine    Vital Signs    Vitals:   08/14/23 1755 08/14/23 2010 08/14/23 2344 08/15/23 0500  BP: (!) 131/48 (!) 116/48  (!) 107/43  Pulse: 76   67  Resp: 19 20  12   Temp: 98 F (36.7 C) 98.1 F (36.7 C) 98 F (36.7 C) 98.1 F (36.7 C)  TempSrc: Oral Oral Oral Oral  SpO2: 93%   96%  Weight:    86.2 kg  Height:        Intake/Output Summary (Last 24 hours) at 08/15/2023 0729 Last data filed at 08/14/2023 2230 Gross per 24 hour  Intake 615 ml  Output 700 ml  Net -85 ml      08/15/2023    5:00 AM 08/14/2023    5:00 AM 08/13/2023    3:11 AM  Last 3 Weights  Weight (lbs) 190 lb 0.6 oz 190 lb 4.1 oz 207 lb 14.3 oz  Weight (kg) 86.2 kg 86.3 kg 94.3 kg      Telemetry    A sensed v paced - Personally Reviewed  ECG    N/a - Personally Reviewed  Physical Exam   GEN: No acute distress.   Neck: No JVD Cardiac: RRR, no murmurs, rubs, or gallops.  Respiratory: faint crackles bilaterally GI: Soft, nontender, non-distended  MS: 1+ bilateral LE edema Neuro:  Nonfocal  Psych: Normal affect   Labs    High Sensitivity Troponin:   Recent Labs  Lab 07/17/23 1210 07/17/23 1546 08/08/23 1645 08/08/23 1927 08/09/23 0427  TROPONINIHS 26* 53* 39* 46* 53*      Chemistry Recent Labs  Lab 08/08/23 1645 08/08/23 1708 08/08/23 1927 08/09/23 0427 08/10/23 0338 08/10/23 1524 08/11/23 0341 08/13/23 0326 08/14/23 0237 08/15/23 0257  NA 135   < >  --  139   < >  --    < > 135 138 137  K 4.3   < >  --  4.3   < >  --    < > 4.1 4.3 4.0  CL 101   < >  --  105   < >  --    < > 98 98 97*  CO2 23  --   --  26   < >  --    < > 31 32 31  GLUCOSE 149*   < >  --  113*   < >  --    < > 95 101* 107*  BUN 22   < >  --  23   < >  --    < > 21 23 23   CREATININE 2.06*   < >  --  2.05*   < >  --    < >  1.76* 1.67* 1.76*  CALCIUM  8.4*  --   --  8.0*   < >  --    < > 7.9* 8.1* 8.2*  MG  --   --  2.0 1.8  --   --   --   --   --   --   PROT 6.8  --   --  5.3*  --  5.2*  --   --   --   --   ALBUMIN  3.3*  --   --  2.6*  --  2.4*  --   --   --   --   AST 310*  --   --  318*  --  168*  --   --   --   --   ALT 194*  --   --  204*  --  167*  --   --   --   --   ALKPHOS 87  --   --  66  --  78  --   --   --   --   BILITOT 1.4*  --   --  1.2*  --  0.8  --   --   --   --   GFRNONAA 24*  --   --  24*   < >  --    < > 29* 31* 29*  ANIONGAP 11  --   --  8   < >  --    < > 6 8 9    < > = values in this interval not displayed.    Lipids No results for input(s): CHOL, TRIG, HDL, LABVLDL, LDLCALC, CHOLHDL in the last 168 hours.  Hematology Recent Labs  Lab 08/12/23 0247 08/13/23 0326 08/15/23 0257  WBC 5.0 4.9 5.2  RBC 2.57* 2.60* 2.50*  HGB 8.0* 8.2* 8.0*  HCT 26.0* 25.6* 24.5*  MCV 101.2* 98.5 98.0  MCH 31.1 31.5 32.0  MCHC 30.8 32.0 32.7  RDW 16.4* 16.7* 16.9*  PLT 137* 203 201   Thyroid   Recent Labs  Lab 08/08/23 1927  TSH 2.287    BNP Recent Labs  Lab 08/08/23 1645  BNP 804.7*    DDimer No results for input(s): DDIMER in the last 168 hours.   Radiology    US  THORACENTESIS ASP PLEURAL SPACE W/IMG GUIDE Result Date: 08/14/2023 INDICATION: 83 year old female with recurrent left pleural effusion. Request for left thoracentesis. EXAM:  ULTRASOUND GUIDED LEFT THORACENTESIS MEDICATIONS: 10 mL 1% lidocaine . COMPLICATIONS: None immediate. PROCEDURE: An ultrasound guided thoracentesis was thoroughly discussed with the patient and questions answered. The benefits, risks, alternatives and complications were also discussed. The patient understands and wishes to proceed with the procedure. Written consent was obtained. Ultrasound was performed to localize and mark an adequate pocket of fluid in the left chest. The area was then prepped and draped in the normal sterile fashion. 1% Lidocaine  was used for local anesthesia. Under ultrasound guidance a 6 Fr Safe-T-Centesis catheter was introduced. Thoracentesis was performed. The catheter was removed and a dressing applied. FINDINGS: A total of approximately 700 mL of clear, yellow fluid was removed. Samples were sent to the laboratory as requested by the clinical team. IMPRESSION: Successful ultrasound guided left thoracentesis yielding 700 mL of pleural fluid. Performed by: Kacie Matthews PA-C Electronically Signed   By: Ester Sides M.D.   On: 08/14/2023 16:12   DG Chest 1 View Result Date: 08/14/2023 CLINICAL DATA:  Status post left thoracentesis. EXAM: CHEST  1 VIEW  COMPARISON:  X-ray preprocedure 08/13/2023. FINDINGS: Slight decreasing left pleural effusion with significant residual. No pneumothorax. Right lung is grossly clear. Stable cardiopericardial silhouette with calcified aorta. Left upper chest defibrillator. Overlapping cardiac leads. Film is under penetrated. IMPRESSION: Decreasing left pleural effusion post thoracentesis with significant residual. No pneumothorax. Electronically Signed   By: Ranell Bring M.D.   On: 08/14/2023 13:32    Cardiac Studies    Patient Profile      Assessment & Plan    1.Acute on chronic HFimpEF - LVEF down to 25-30% in 2017, s/p LAD stent LVEF improved to 60-65%  - LVEF fluctuates between 45% to 60% over the years. Likely non-ischemic/mixed etiology   Last LHC (09/2019) showed non-obstructive CAD with patent LAD stent  - Echo (08/09/23) showed LVEF 55-60%, no regional wall motion abnormalities, grade I diastolic dysfunction, normal RV, large pleural effusion, IVC dilated  - BNP 804   -mixed compliance with diuretic at home - incomplete I/Os this admit. Negative just 85mL yesterday from charting.  Unclear accuracy of weights reporting 17 lbs weight loss.  She is on IV lasix  80mg  bid, somewhat labile Cr without clear trend but within prior range - medical therapy with toprol  12.5mg  daily  - remains fluid overloaded, continue IV diuretics today. Possibly transition to oral tomorrow   2. Pleural effusion - She had a bedside thoracentesis 08/08/23 in the ED with 900 cc output, transudative, cytology showed reactive mesothelial cells present, negative for malignancy  - repeat thora yesterday with removed from right side   3. History of VT/VF - has ICD/CRT - has been on amio 200mg  daily For questions or updates, please contact  [facility] Please consult [email or web address] for contact info under        Signed, Alvan Carrier, MD  08/15/2023, 7:29 AM

## 2023-08-15 NOTE — Plan of Care (Signed)
  Problem: Education: Goal: Knowledge of General Education information will improve Description: Including pain rating scale, medication(s)/side effects and non-pharmacologic comfort measures Outcome: Progressing   Problem: Clinical Measurements: Goal: Respiratory complications will improve Outcome: Progressing Goal: Cardiovascular complication will be avoided Outcome: Progressing   Problem: Activity: Goal: Risk for activity intolerance will decrease Outcome: Progressing   Problem: Elimination: Goal: Will not experience complications related to bowel motility Outcome: Progressing Goal: Will not experience complications related to urinary retention Outcome: Progressing   Problem: Safety: Goal: Ability to remain free from injury will improve Outcome: Progressing   Problem: Skin Integrity: Goal: Risk for impaired skin integrity will decrease Outcome: Progressing

## 2023-08-16 DIAGNOSIS — I5033 Acute on chronic diastolic (congestive) heart failure: Secondary | ICD-10-CM | POA: Diagnosis not present

## 2023-08-16 LAB — BASIC METABOLIC PANEL
Anion gap: 9 (ref 5–15)
BUN: 21 mg/dL (ref 8–23)
CO2: 32 mmol/L (ref 22–32)
Calcium: 8.1 mg/dL — ABNORMAL LOW (ref 8.9–10.3)
Chloride: 94 mmol/L — ABNORMAL LOW (ref 98–111)
Creatinine, Ser: 1.71 mg/dL — ABNORMAL HIGH (ref 0.44–1.00)
GFR, Estimated: 30 mL/min — ABNORMAL LOW (ref >60)
Glucose, Bld: 99 mg/dL (ref 70–99)
Potassium: 3.6 mmol/L (ref 3.5–5.1)
Sodium: 135 mmol/L (ref 135–145)

## 2023-08-16 LAB — CBC
HCT: 26.5 % — ABNORMAL LOW (ref 36.0–46.0)
Hemoglobin: 8.5 g/dL — ABNORMAL LOW (ref 12.0–15.0)
MCH: 31.4 pg (ref 26.0–34.0)
MCHC: 32.1 g/dL (ref 30.0–36.0)
MCV: 97.8 fL (ref 80.0–100.0)
Platelets: 206 10*3/uL (ref 150–400)
RBC: 2.71 MIL/uL — ABNORMAL LOW (ref 3.87–5.11)
RDW: 17.1 % — ABNORMAL HIGH (ref 11.5–15.5)
WBC: 4.9 10*3/uL (ref 4.0–10.5)
nRBC: 0 % (ref 0.0–0.2)

## 2023-08-16 LAB — BRAIN NATRIURETIC PEPTIDE: B Natriuretic Peptide: 246.6 pg/mL — ABNORMAL HIGH (ref 0.0–100.0)

## 2023-08-16 LAB — CYTOLOGY - NON PAP

## 2023-08-16 MED ORDER — METOLAZONE 5 MG PO TABS
5.0000 mg | ORAL_TABLET | Freq: Every day | ORAL | Status: DC
  Administered 2023-08-16 – 2023-08-17 (×2): 5 mg via ORAL
  Filled 2023-08-16 (×3): qty 1

## 2023-08-16 MED ORDER — LIDOCAINE 5 % EX PTCH
1.0000 | MEDICATED_PATCH | CUTANEOUS | Status: DC
  Administered 2023-08-16 – 2023-08-21 (×5): 1 via TRANSDERMAL
  Filled 2023-08-16 (×5): qty 1

## 2023-08-16 NOTE — Progress Notes (Signed)
 Progress Note  Patient Name: Lisa [name] Date of Encounter: 08/16/2023 Primary Cardiologist: Vina Gull, MD   Subjective   Overnight no events. Patient notes that she slept poor. Receive Metolazone  and noted robust response. She notes a prior UTI (pseudomonas) in 2023  Vital Signs    Vitals:   08/15/23 2159 08/16/23 0024 08/16/23 0403 08/16/23 0410  BP:      Pulse:      Resp:   19   Temp:  98.5 F (36.9 C) 98.2 F (36.8 C)   TempSrc:  Oral Oral   SpO2:      Weight: 84.8 kg   84.8 kg  Height:        Intake/Output Summary (Last 24 hours) at 08/16/2023 0752 Last data filed at 08/16/2023 0300 Gross per 24 hour  Intake 240 ml  Output 1800 ml  Net -1560 ml   Filed Weights   08/15/23 0500 08/15/23 2159 08/16/23 0410  Weight: 86.2 kg 84.8 kg 84.8 kg    Physical Exam   GEN: No acute distress.   Neck: +1 JVD Cardiac: RRR, no murmurs, rubs, or gallops.  Respiratory: Decreased breath sounds left lung field GI: Soft, nontender, non-distended  MS: +1 bilateral edema  Labs   Telemetry: V paced   Chemistry Recent Labs  Lab 08/10/23 1524 08/11/23 0341 08/14/23 0237 08/15/23 0257 08/16/23 0232  NA  --    < > 138 137 135  K  --    < > 4.3 4.0 3.6  CL  --    < > 98 97* 94*  CO2  --    < > 32 31 32  GLUCOSE  --    < > 101* 107* 99  BUN  --    < > 23 23 21   CREATININE  --    < > 1.67* 1.76* 1.71*  CALCIUM   --    < > 8.1* 8.2* 8.1*  PROT 5.2*  --   --   --   --   ALBUMIN  2.4*  --   --   --   --   AST 168*  --   --   --   --   ALT 167*  --   --   --   --   ALKPHOS 78  --   --   --   --   BILITOT 0.8  --   --   --   --   GFRNONAA  --    < > 31* 29* 30*  ANIONGAP  --    < > 8 9 9    < > = values in this interval not displayed.     Hematology Recent Labs  Lab 08/13/23 0326 08/15/23 0257 08/16/23 0232  WBC 4.9 5.2 4.9  RBC 2.60* 2.50* 2.71*  HGB 8.2* 8.0* 8.5*  HCT 25.6* 24.5* 26.5*  MCV 98.5 98.0 97.8  MCH 31.5 32.0 31.4  MCHC 32.0 32.7 32.1  RDW  16.7* 16.9* 17.1*  PLT 203 201 206    Cardiac EnzymesNo results for input(s): TROPONINI in the last 168 hours. No results for input(s): TROPIPOC in the last 168 hours.   BNP Recent Labs  Lab 08/16/23 0232  BNP 246.6*     DDimer No results for input(s): DDIMER in the last 168 hours.   Cardiac Studies   Cardiac Studies & Procedures   CARDIAC CATHETERIZATION  CARDIAC CATHETERIZATION 09/22/2019  Narrative  Mid LM to Dist LM lesion is 20% stenosed.  Previously placed Mid LAD stent (unknown type) is widely patent.  Ramus lesion is 65% stenosed.  The left ventricular systolic function is normal.  LV end diastolic pressure is moderately elevated.  The left ventricular ejection fraction is 50-55% by visual estimate.  1. Nonobstructive CAD. The stent in the mid LAD is widely patent. There is modest ostial disease in a small ramus branch that is unchanged from 2017. 2. Mild LV dysfunction. EF 50%. 3. Elevated LVEDP 23 mm Hg.  Plan; medical therapy.  Findings Coronary Findings Diagnostic  Dominance: Right  Left Main Mid LM to Dist LM lesion is 20% stenosed.  Left Anterior Descending Previously placed Mid LAD stent (unknown type) is widely patent.  Ramus Intermedius Vessel is small. Ramus lesion is 65% stenosed.  Left Circumflex Vessel was injected. Vessel is normal in caliber. Vessel is angiographically normal.  Right Coronary Artery Vessel was injected. Vessel is normal in caliber. Vessel is angiographically normal.  Intervention  No interventions have been documented.   CARDIAC CATHETERIZATION  CARDIAC CATHETERIZATION 10/07/2015  Narrative 1. Severe stenosis mid LAD 2. Successful PTCA/DES x 1 mid LAD  Recommendation: Continue ASA and Brilinta  for at least one year. Continue beta blocker and statin. Further planning for ICD vs LifeVest per EP team.  Findings Coronary Findings Diagnostic  Dominance: Right  Left Anterior  Descending Discrete.  Intervention  Mid LAD lesion PCI The pre-interventional distal flow is normal (TIMI 3). Pre-stent angioplasty was performed. A drug-eluting stent was placed. The strut is apposed. Post-stent angioplasty was performed. The post-interventional distal flow is normal (TIMI 3). The intervention was successful. No complications occurred at this lesion. There is a 0% residual stenosis post intervention.   STRESS TESTS  MYOCARDIAL PERFUSION IMAGING 09/05/2019  Narrative  Nuclear stress EF: 47%. Visually, the EF appears to be greater than the computer generated EF of 47%.  There was no ST segment deviation noted during stress.  This is a low risk study.  The study is normal. There is no evidence of ischemia or previous infarction.  ECHOCARDIOGRAM  ECHOCARDIOGRAM COMPLETE 08/09/2023  Narrative ECHOCARDIOGRAM REPORT    Patient Name:   NEELA ZECCA Date of Exam: 08/09/2023 Medical Rec #:  [id number]          Height:       62.0 in Accession #:    [id number]         Weight:       206.2 lb Date of Birth:  04-Sep-1940           BSA:          1.937 m Patient Age:    82 years           BP:           124/48 mmHg Patient Gender: F                  HR:           67 bpm. Exam Location:  Inpatient  Procedure: 2D Echo, Cardiac Doppler and Color Doppler  Indications:    CHF  History:        Patient has prior history of Echocardiogram examinations, most recent 07/18/2023. CHF, Signs/Symptoms:Chest Pain; Risk Factors:Hypertension.  Sonographer:    Lanell Maduro Referring Phys: [id number] JUSTIN B HOWERTER  IMPRESSIONS   1. Left ventricular ejection fraction, by estimation, is 55 to 60%. The left ventricle has normal function. The left ventricle has no regional wall motion abnormalities. Left ventricular diastolic  parameters are consistent with Grade I diastolic dysfunction (impaired relaxation). 2. Right ventricular systolic function is normal. The right  ventricular size is normal. 3. Large pleural effusion. 4. The mitral valve is normal in structure. No evidence of mitral valve regurgitation. No evidence of mitral stenosis. 5. The aortic valve is normal in structure. Aortic valve regurgitation is not visualized. No aortic stenosis is present. 6. The inferior vena cava is dilated in size with <50% respiratory variability, suggesting right atrial pressure of 15 mmHg.  FINDINGS Left Ventricle: Left ventricular ejection fraction, by estimation, is 55 to 60%. The left ventricle has normal function. The left ventricle has no regional wall motion abnormalities. The left ventricular internal cavity size was normal in size. There is no left ventricular hypertrophy. Left ventricular diastolic parameters are consistent with Grade I diastolic dysfunction (impaired relaxation). Indeterminate filling pressures.  Right Ventricle: The right ventricular size is normal. No increase in right ventricular wall thickness. Right ventricular systolic function is normal.  Left Atrium: Left atrial size was normal in size.  Right Atrium: Right atrial size was normal in size.  Pericardium: There is no evidence of pericardial effusion.  Mitral Valve: The mitral valve is normal in structure. No evidence of mitral valve regurgitation. No evidence of mitral valve stenosis.  Tricuspid Valve: The tricuspid valve is normal in structure. Tricuspid valve regurgitation is not demonstrated. No evidence of tricuspid stenosis.  Aortic Valve: The aortic valve is normal in structure. Aortic valve regurgitation is not visualized. No aortic stenosis is present.  Pulmonic Valve: The pulmonic valve was normal in structure. Pulmonic valve regurgitation is not visualized. No evidence of pulmonic stenosis.  Aorta: The aortic root is normal in size and structure.  Venous: The inferior vena cava is dilated in size with less than 50% respiratory variability, suggesting right atrial  pressure of 15 mmHg.  IAS/Shunts: No atrial level shunt detected by color flow Doppler.  Additional Comments: There is a large pleural effusion.   LEFT VENTRICLE PLAX 2D LVIDd:         4.80 cm      Diastology LVIDs:         4.30 cm      LV e' medial:    6.85 cm/s LV PW:         0.70 cm      LV E/e' medial:  10.8 LV IVS:        0.60 cm      LV e' lateral:   6.31 cm/s LVOT diam:     1.50 cm      LV E/e' lateral: 11.7 LV SV:         53 LV SV Index:   27 LVOT Area:     1.77 cm  LV Volumes (MOD) LV vol d, MOD A2C: 129.0 ml LV vol d, MOD A4C: 81.3 ml LV vol s, MOD A2C: 46.3 ml LV vol s, MOD A4C: 36.1 ml LV SV MOD A2C:     82.7 ml LV SV MOD A4C:     81.3 ml LV SV MOD BP:      63.6 ml  RIGHT VENTRICLE             IVC RV Basal diam:  2.40 cm     IVC diam: 2.30 cm RV S prime:     13.80 cm/s TAPSE (M-mode): 3.9 cm  LEFT ATRIUM             Index  RIGHT ATRIUM           Index LA diam:        4.60 cm 2.38 cm/m   RA Area:     12.90 cm LA Vol (A2C):   56.0 ml 28.92 ml/m  RA Volume:   23.40 ml  12.08 ml/m LA Vol (A4C):   50.8 ml 26.23 ml/m LA Biplane Vol: 56.1 ml 28.97 ml/m AORTIC VALVE LVOT Vmax:   138.00 cm/s LVOT Vmean:  95.000 cm/s LVOT VTI:    0.299 m  AORTA Ao Asc diam: 3.40 cm  MITRAL VALVE MV Area (PHT): 3.86 cm    SHUNTS MV E velocity: 73.70 cm/s  Systemic VTI:  0.30 m MV A velocity: 86.60 cm/s  Systemic Diam: 1.50 cm MV E/A ratio:  0.85  Annabella Scarce MD Electronically signed by Annabella Scarce MD Signature Date/Time: 08/09/2023/3:33:30 PM    Final                 Assessment & Plan   Acute on chronic HR- recovered- EF - LVEF down to 25-30% in 2017, s/p LAD stent LVEF improved to 60-65%  - LVEF fluctuates between 45% to 60% over the years. 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 Fairy   For questions or updates, please contact [facility] Please consult [email or web address] for contact info under Cardiology/STEMI.      Stanly Leavens, MD FASE [facility] Cardiologist [facility]  [address] [place], [address] [place], [place] [address] [phone]  7:52 AM

## 2023-08-16 NOTE — Progress Notes (Signed)
 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 -Back in the ED 12/29, reported 2 to 3 days of progressive dyspnea on exertion with cough orthopnea and nausea vomiting. -In the ED hypertensive, hypoxic, underwent left thoracentesis by EDP 12/29, 1.1 mL of pleural fluid drained, labs noted creatinine 2.0, sodium 135, mildly elevated LFTs, BNP 804, troponin 39, 46, WBC 8.6, hemoglobin 9.8.  CT chest abdomen pelvis demonstrated large left pleural effusion, moderate right pleural effusion, small ascites, liver nodularity concerning for cirrhosis -Admitted, started on diuretics, cards following -Attempted repeat thoracentesis in IR 1/4, 750 mL drained, procedure had to be stopped on account of symptoms, perIR PA at least 2 L present   Subjective: -Feels better overall, breathing is not back to baseline  Assessment and Plan:  Acute on chronic diastolic CHF Recurrent pleural effusions left> right -Last echo 12/24 with EF 60-65%, normal RV, moderate pericardial effusion -Recent admission for same, baseline on torsemide  20 Mg daily with poor compliance, underwent left thoracentesis on prior admission 12/10, cytology negative -Repeat L thoracentesis in ED 12/30, 1.1 L drained, again transudative, despite pleural fluid LDH being high, serum LDH is considerably elevated from CML, cytology is negative -Slowly diuresing, weight down 26 LB, urine output inaccurate , repeat thoracentesis , protein ratio suggests exudative fluid, LDH suggest transudative, follow-up cytology -Continue IV Lasix , repeat  metolazone  -Suspect cirrhosis and hypoalbuminemia contributing to recurrent effusions, I worry her prognosis is poor -GDMT limited by CKD -Increase activity, PT OT eval, wean O2  Cirrhosis, noted on imaging CT abdomen 12/29 Hypoalbuminemia -Contributing to fluid overloaded state -Diuretics as above  Acute hypoxic respiratory failure -Secondary to diastolic CHF, pleural effusions left> right -As above, wean O2 -See above  Acute on chronic anemia History of CML, on Gleevec  -Anemia panel suggestive of chronic disease -Hemoglobin relatively stable in the 7.5- 9 range, multifactorial from chronic kidney disease, CML & Gleevec , transfuse if Hb trends down further  History of chronic nausea and vomiting -I suspect this is multifactorial, very common side effect of Gleevec , also suspect a posttussive component to her vomiting -Supportive care,   Mechanical fall, generalized weakness -Likely secondary to dyspnea and hypoxia -Imaging unremarkable for injuries, PT OT consult  AKI on CKD 3B,  -baseline creatinine around 1.6-2 -Relatively stable, monitor with diuresis  Abnormal LFTs -Could be related to passive hepatic congestion, Gleevac, improving  History of ventricular tachycardia On amiodarone   Hypothyroidism Continue Synthroid  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, needs further follow-up  DVT prophylaxis: lovenox  Code Status: Full Code Family Communication: None present Disposition Plan: Home with home health services  Consultants:    Procedures:   Antimicrobials:    Objective: Vitals:   08/16/23 0403 08/16/23 0410 08/16/23 0836 08/16/23 1016  BP:   (!) 114/44 (!) 123/53  Pulse:   65 63  Resp: 19  20 (!) 21  Temp: 98.2 F (36.8 C)  98 F (36.7 C) 97.9 F (36.6 C)  TempSrc: Oral  Oral Oral  SpO2:   98% 95%  Weight:  84.8 kg    Height:        Intake/Output Summary (Last 24 hours) at 08/16/2023 1133 Last  data filed at 08/16/2023 1116 Gross per 24 hour  Intake  480 ml  Output 2250 ml  Net -1770 ml   Filed Weights   08/15/23 0500 08/15/23 2159 08/16/23 0410  Weight: 86.2 kg 84.8 kg 84.8 kg    Examination:  General exam: Obese chronically ill female sitting up in bed, AAOx3 HEENT: Positive JVD VS: S1-S2, regular rhythm Lungs with decreased breath sounds on the left Abdomen: Soft, nontender, bowel sounds present Extremities: Trace edema, chronic skin changes and deformities  Psychiatry:  Mood & affect appropriate.     Data Reviewed:   CBC: Recent Labs  Lab 08/11/23 0341 08/12/23 0247 08/13/23 0326 08/15/23 0257 08/16/23 0232  WBC 5.8 5.0 4.9 5.2 4.9  HGB 7.7* 8.0* 8.2* 8.0* 8.5*  HCT 24.2* 26.0* 25.6* 24.5* 26.5*  MCV 98.0 101.2* 98.5 98.0 97.8  PLT 196 137* 203 201 206   Basic Metabolic Panel: Recent Labs  Lab 08/12/23 0247 08/13/23 0326 08/14/23 0237 08/15/23 0257 08/16/23 0232  NA 135 135 138 137 135  K 4.2 4.1 4.3 4.0 3.6  CL 102 98 98 97* 94*  CO2 24 31 32 31 32  GLUCOSE 126* 95 101* 107* 99  BUN 24* 21 23 23 21   CREATININE 1.72* 1.76* 1.67* 1.76* 1.71*  CALCIUM  7.7* 7.9* 8.1* 8.2* 8.1*   GFR: Estimated Creatinine Clearance: 25.5 mL/min (A) (by C-G formula based on SCr of 1.71 mg/dL (H)). Liver Function Tests: Recent Labs  Lab 08/10/23 1524  AST 168*  ALT 167*  ALKPHOS 78  BILITOT 0.8  PROT 5.2*  ALBUMIN  2.4*   No results for input(s): LIPASE, AMYLASE in the last 168 hours. No results for input(s): AMMONIA in the last 168 hours. Coagulation Profile: No results for input(s): INR, PROTIME in the last 168 hours.  Cardiac Enzymes: No results for input(s): CKTOTAL, CKMB, CKMBINDEX, TROPONINI in the last 168 hours. BNP (last 3 results) No results for input(s): PROBNP in the last 8760 hours. HbA1C: No results for input(s): HGBA1C in the last 72 hours. CBG: No results for input(s): GLUCAP in the last 168 hours. Lipid Profile: No results for input(s): CHOL, HDL, LDLCALC,  TRIG, CHOLHDL, LDLDIRECT in the last 72 hours. Thyroid  Function Tests: No results for input(s): TSH, T4TOTAL, FREET4, T3FREE, THYROIDAB in the last 72 hours.  Anemia Panel: No results for input(s): VITAMINB12, FOLATE, FERRITIN, TIBC, IRON, RETICCTPCT in the last 72 hours.  Urine analysis:    Component Value Date/Time   COLORURINE YELLOW 08/09/2023 0742   APPEARANCEUR CLEAR 08/09/2023 0742   LABSPEC 1.009 08/09/2023 0742   PHURINE 5.0 08/09/2023 0742   GLUCOSEU NEGATIVE 08/09/2023 0742   HGBUR NEGATIVE 08/09/2023 0742   BILIRUBINUR NEGATIVE 08/09/2023 0742   BILIRUBINUR negative 06/04/2016 0847   BILIRUBINUR neg 02/14/2015 1431   KETONESUR NEGATIVE 08/09/2023 0742   PROTEINUR NEGATIVE 08/09/2023 0742   UROBILINOGEN 0.2 06/04/2016 0847   NITRITE NEGATIVE 08/09/2023 0742   LEUKOCYTESUR TRACE (A) 08/09/2023 0742   Sepsis Labs: @LABRCNTIP (procalcitonin:4,lacticidven:4)  ) Recent Results (from the past 240 hours)  Body fluid culture w Gram Stain     Status: None   Collection Time: 08/08/23  9:34 PM   Specimen: Pleural Fluid  Result Value Ref Range Status   Specimen Description FLUID PLEURAL  Final   Special Requests NONE  Final   Gram Stain NO WBC SEEN NO ORGANISMS SEEN   Final   Culture   Final    NO GROWTH 3 DAYS Performed at [facility]  Lab, [address], [place], [place] [address]    Report Status 08/12/2023 FINAL  Final  Blood culture (routine x 2)     Status: None   Collection Time: 08/08/23  9:36 PM   Specimen: BLOOD RIGHT ARM  Result Value Ref Range Status   Specimen Description BLOOD RIGHT ARM  Final   Special Requests   Final    BOTTLES DRAWN AEROBIC AND ANAEROBIC Blood Culture adequate volume   Culture   Final    NO GROWTH 5 DAYS Performed at [facility] Lab, [address], [place], [place] [address]    Report Status 08/13/2023 FINAL  Final  Blood culture (routine x 2)     Status: None   Collection Time: 08/08/23  9:36  PM   Specimen: BLOOD LEFT HAND  Result Value Ref Range Status   Specimen Description BLOOD LEFT HAND  Final   Special Requests   Final    BOTTLES DRAWN AEROBIC AND ANAEROBIC Blood Culture adequate volume   Culture   Final    NO GROWTH 5 DAYS Performed at [facility] Lab, [address], [place], [place] [address]    Report Status 08/13/2023 FINAL  Final  Culture, body fluid w Gram Stain-bottle     Status: None (Preliminary result)   Collection Time: 08/14/23  1:08 PM   Specimen: Pleura  Result Value Ref Range Status   Specimen Description PLEURAL  Final   Special Requests NONE  Final   Culture   Final    NO GROWTH 2 DAYS Performed at [facility] Lab, [address] [place], [place] [address]    Report Status PENDING  Incomplete  Gram stain     Status: None   Collection Time: 08/14/23  1:08 PM   Specimen: Pleura  Result Value Ref Range Status   Specimen Description PLEURAL  Final   Special Requests NONE  Final   Gram Stain   Final    WBC PRESENT, PREDOMINANTLY MONONUCLEAR NO ORGANISMS SEEN CYTOSPIN SMEAR Performed at [facility] Lab, [address] [place], [place] [address]    Report Status 08/14/2023 FINAL  Final     Radiology Studies: US  THORACENTESIS ASP PLEURAL SPACE W/IMG GUIDE Result Date: 08/14/2023 INDICATION: 83 year old female with recurrent left pleural effusion. Request for left thoracentesis. EXAM: ULTRASOUND GUIDED LEFT THORACENTESIS MEDICATIONS: 10 mL 1% lidocaine . COMPLICATIONS: None immediate. PROCEDURE: An ultrasound guided thoracentesis was thoroughly discussed with the patient and questions answered. The benefits, risks, alternatives and complications were also discussed. The patient understands and wishes to proceed with the procedure. Written consent was obtained. Ultrasound was performed to localize and mark an adequate pocket of fluid in the left chest. The area was then prepped and draped in the normal sterile fashion. 1% Lidocaine  was used  for local anesthesia. Under ultrasound guidance a 6 Fr Safe-T-Centesis catheter was introduced. Thoracentesis was performed. The catheter was removed and a dressing applied. FINDINGS: A total of approximately 700 mL of clear, yellow fluid was removed. Samples were sent to the laboratory as requested by the clinical team. IMPRESSION: Successful ultrasound guided left thoracentesis yielding 700 mL of pleural fluid. Performed by: Kacie Matthews PA-C Electronically Signed   By: Ester Sides M.D.   On: 08/14/2023 16:12   DG Chest 1 View Result Date: 08/14/2023 CLINICAL DATA:  Status post left thoracentesis. EXAM: CHEST  1 VIEW COMPARISON:  X-ray preprocedure 08/13/2023. FINDINGS: Slight decreasing left pleural effusion with significant residual. No  pneumothorax. Right lung is grossly clear. Stable cardiopericardial silhouette with calcified aorta. Left upper chest defibrillator. Overlapping cardiac leads. Film is under penetrated. IMPRESSION: Decreasing left pleural effusion post thoracentesis with significant residual. No pneumothorax. Electronically Signed   By: Ranell Bring M.D.   On: 08/14/2023 13:32     Scheduled Meds:  amiodarone   200 mg Oral Daily   aspirin  EC  81 mg Oral Daily   bisacodyl   10 mg Rectal Once   enoxaparin  (LOVENOX ) injection  30 mg Subcutaneous Q24H   furosemide   80 mg Intravenous BID   imatinib   400 mg Oral Daily   lidocaine   1 patch Transdermal Q24H   metolazone   5 mg Oral Daily   metoprolol  succinate  12.5 mg Oral Daily   polyethylene glycol  17 g Oral BID   thyroid   15 mg Oral QAC breakfast   timolol   1 drop Left Eye BID   Continuous Infusions:   LOS: 8 days    Time spent:    Sigurd Pac, MD [facility]   08/16/2023, 11:33 AM

## 2023-08-16 NOTE — Plan of Care (Signed)

## 2023-08-16 NOTE — Progress Notes (Signed)
 No ICM remote transmission received for 08/16/2023 due to currently hospitalized and next ICM transmission scheduled for 08/30/2023.

## 2023-08-16 NOTE — Progress Notes (Signed)
 Physical Therapy Treatment Patient Details Name: Lisa [name] MRN: [id number] DOB: [birth year] Today's Date: 08/16/2023   History of Present Illness Pt is an 83 year old woman admitted 10/08/2022 with suspected acute on chronic diastolic heart failure after presenting from home complaining of shortness of breath. PMH includes: + for pseudomonas, CHF exacerbation, L pleural effusion. 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    PT Comments  Patient resting in recliner and reports purewick device not working and soiled with urine. CGA required for sit<>stand from recliner and step pivot chair>BSC. Pt able to complete voiding of bladder and able to complete pericare in standing with single UE support to stabilize balance. Session focused on functional LE strengthening in sitting and standing. EOS pt requested return to bed and CGA provided for short amb transfer with RW, ~6'. EOS pt resting comforatbly, Alarm on and call bell within reach. Will continue to progress pt as able.    If plan is discharge home, recommend the following: A little help with walking and/or transfers;A little help with bathing/dressing/bathroom;Assistance with cooking/housework;Assist for transportation;Help with stairs or ramp for entrance   Can travel by private vehicle        Equipment Recommendations  None recommended by PT    Recommendations for Other Services       Precautions / Restrictions Precautions Precautions: Fall Restrictions Weight Bearing Restrictions Per Provider Order: No     Mobility  Bed Mobility Overal bed mobility: Needs Assistance Bed Mobility: Sit to Supine       Sit to supine: Min assist   General bed mobility comments: min assist to bring LE's onto bed, pt able to control lowering trunk and scoot to center self with return to supine.    Transfers Overall transfer level: Needs assistance Equipment used: Rolling walker (2 wheels), 1 person hand  held assist Transfers: Sit to/from Stand, Bed to chair/wheelchair/BSC Sit to Stand: Contact guard assist   Step pivot transfers: Contact guard assist       General transfer comment: CGA for rise from recliner. pt reliant on UE use to power up to stand. Pt able to place hands on knees to faciliate LE strengthening for slow eccentric lower to sit. Repeated sit<>stand completed for strengthening. Pt performed step pivot transfer with HHA/CGA chair>BSC>chair.    Ambulation/Gait Ambulation/Gait assistance: Contact guard assist Gait Distance (Feet): 6 Feet Assistive device: Rolling walker (2 wheels) Gait Pattern/deviations: Step-through pattern, Decreased stride length, Wide base of support Gait velocity: decr     General Gait Details: CGA for safety with RW, pt with wide BOS to stabilize balance. pace slow and cautious, no LOB noted.   Stairs             Wheelchair Mobility     Tilt Bed    Modified Rankin (Stroke Patients Only)       Balance Overall balance assessment: Needs assistance Sitting-balance support: Feet unsupported, Bilateral upper extremity supported Sitting balance-Leahy Scale: Good     Standing balance support: Bilateral upper extremity supported, During functional activity Standing balance-Leahy Scale: Fair                              Cognition Arousal: Alert Behavior During Therapy: WFL for tasks assessed/performed Overall Cognitive Status: Within Functional Limits for tasks assessed  Exercises General Exercises - Lower Extremity Long Arc Quad: AROM, Both, 10 reps, Standing, Seated Hip ABduction/ADduction: AROM, Both, 10 reps, Standing, Seated Hip Flexion/Marching: AROM, Both, 10 reps, Standing Heel Raises: AROM, Both, 10 reps, Standing Mini-Sqauts: Limitations Mini Squats Limitations: 2x5 reps sit<>stand with UE use to power up and focus on slow eccentric lowering.     General Comments        Pertinent Vitals/Pain Pain Assessment Pain Assessment: Faces Faces Pain Scale: Hurts little more Pain Location: Rt buttocks/hip Pain Descriptors / Indicators: Discomfort Pain Intervention(s): Limited activity within patient's tolerance, Monitored during session, Repositioned    Home Living                          Prior Function            PT Goals (current goals can now be found in the care plan section) Acute Rehab PT Goals Patient Stated Goal: get well go home PT Goal Formulation: With patient Time For Goal Achievement: 08/24/23 Potential to Achieve Goals: Good Progress towards PT goals: Progressing toward goals    Frequency    Min 1X/week      PT Plan      Co-evaluation              AM-PAC PT 6 Clicks Mobility   Outcome Measure  Help needed turning from your back to your side while in a flat bed without using bedrails?: None Help needed moving from lying on your back to sitting on the side of a flat bed without using bedrails?: A Little Help needed moving to and from a bed to a chair (including a wheelchair)?: A Little Help needed standing up from a chair using your arms (e.g., wheelchair or bedside chair)?: A Little Help needed to walk in hospital room?: A Little Help needed climbing 3-5 steps with a railing? : A Lot 6 Click Score: 18    End of Session Equipment Utilized During Treatment: Gait belt Activity Tolerance: Patient tolerated treatment well Patient left: in chair;with call bell/phone within reach Nurse Communication: Mobility status PT Visit Diagnosis: Difficulty in walking, not elsewhere classified (R26.2);Muscle weakness (generalized) (M62.81);Unsteadiness on feet (R26.81)     Time: 8453-8374 PT Time Calculation (min) (ACUTE ONLY): 39 min  Charges:    $Therapeutic Exercise: 8-22 mins $Therapeutic Activity: 23-37 mins PT General Charges $$ ACUTE PT VISIT: 1 Visit                     Vernell DONEEN KLEIN, DPT Acute Rehabilitation Services Office [phone]  08/16/23 4:48 PM

## 2023-08-17 ENCOUNTER — Inpatient Hospital Stay (HOSPITAL_COMMUNITY): Payer: Medicare Other

## 2023-08-17 DIAGNOSIS — I5033 Acute on chronic diastolic (congestive) heart failure: Secondary | ICD-10-CM | POA: Diagnosis not present

## 2023-08-17 LAB — BASIC METABOLIC PANEL
Anion gap: 11 (ref 5–15)
BUN: 22 mg/dL (ref 8–23)
CO2: 33 mmol/L — ABNORMAL HIGH (ref 22–32)
Calcium: 8.4 mg/dL — ABNORMAL LOW (ref 8.9–10.3)
Chloride: 92 mmol/L — ABNORMAL LOW (ref 98–111)
Creatinine, Ser: 1.8 mg/dL — ABNORMAL HIGH (ref 0.44–1.00)
GFR, Estimated: 28 mL/min — ABNORMAL LOW (ref >60)
Glucose, Bld: 116 mg/dL — ABNORMAL HIGH (ref 70–99)
Potassium: 3.4 mmol/L — ABNORMAL LOW (ref 3.5–5.1)
Sodium: 136 mmol/L (ref 135–145)

## 2023-08-17 LAB — BRAIN NATRIURETIC PEPTIDE: B Natriuretic Peptide: 234.2 pg/mL — ABNORMAL HIGH (ref 0.0–100.0)

## 2023-08-17 MED ORDER — SENNOSIDES-DOCUSATE SODIUM 8.6-50 MG PO TABS
1.0000 | ORAL_TABLET | Freq: Two times a day (BID) | ORAL | Status: DC
  Administered 2023-08-17 (×2): 1 via ORAL
  Filled 2023-08-17 (×2): qty 1

## 2023-08-17 MED ORDER — POTASSIUM CHLORIDE CRYS ER 20 MEQ PO TBCR
40.0000 meq | EXTENDED_RELEASE_TABLET | Freq: Two times a day (BID) | ORAL | Status: AC
  Administered 2023-08-17 (×2): 40 meq via ORAL
  Filled 2023-08-17 (×2): qty 2

## 2023-08-17 NOTE — Care Management Important Message (Signed)
 Important Message  Patient Details  Name: Lisa [name] MRN: [id number] Date of Birth: 07-07-1941   Important Message Given:  Yes - Medicare IM     Renie Ora 08/17/2023, 10:39 AM

## 2023-08-17 NOTE — Progress Notes (Signed)
 OT Cancellation Note  Patient Details Name: Lisa [name] MRN: [id number] DOB: 12/28/1940   Cancelled Treatment:    Reason Eval/Treat Not Completed: Patient declined, no reason specified. Pt requested OT return later. OT will follow up next available time as able  [name] 08/17/2023, 10:38 AM

## 2023-08-17 NOTE — TOC Progression Note (Signed)
 Transition of Care [facility]) - Progression Note    Patient Details  Name: GEAN LAURSEN MRN: [id number] Date of Birth: [birth year]  Transition of Care [facility]) CM/SW Contact  Waddell Barnie Rama, RN Phone Number: 08/17/2023, 3:44 PM  Clinical Narrative:    She is set up with [facility] for OP PT thru epic, listed on AVS, conts on iv lasix  for CHF.    Expected Discharge Plan: Home/Self Care Barriers to Discharge: Continued Medical Work up  Expected Discharge Plan and Services In-house Referral: NA Discharge Planning Services: CM Consult Post Acute Care Choice: NA Living arrangements for the past 2 months: Single Family Home                 DME Arranged: N/A DME Agency: NA       HH Arranged: NA           Social Determinants of Health (SDOH) Interventions SDOH Screenings   Food Insecurity: No Food Insecurity (08/09/2023)  Housing: Low Risk  (08/09/2023)  Transportation Needs: No Transportation Needs (08/09/2023)  Utilities: Not At Risk (08/09/2023)  Financial Resource Strain: Low Risk  (10/28/2022)   Received from [facility]  Social Connections: Moderately Isolated (08/09/2023)  Tobacco Use: Low Risk  (08/08/2023)    Readmission Risk Interventions    08/10/2023    2:10 PM 07/19/2023    3:29 PM  Readmission Risk Prevention Plan  Transportation Screening Complete Complete  PCP or Specialist Appt within 5-7 Days  Complete  PCP or Specialist Appt within 3-5 Days Complete   Home Care Screening  Complete  Medication Review (RN CM)  Complete  HRI or Home Care Consult Complete   Palliative Care Screening Not Applicable   Medication Review (RN Care Manager) Complete

## 2023-08-17 NOTE — Plan of Care (Signed)

## 2023-08-17 NOTE — Progress Notes (Signed)
 PROGRESS NOTE    Lisa [name]  FMW:[id number] DOB: Apr 30, 1941 DOA: 08/08/2023 PCP: Claudene Thersia Reusing, DO  82/F with CAD, chronic diastolic CHF, CML on Gleevec , chronic nausea and vomiting, chronic anemia, paroxysmal ventricular tachycardia, hypothyroidism, CKD 3B, recently hospitalized 12/7-12/11 with diastolic CHF complicated by left pleural effusion, diuresed and underwent left thoracentesis on 12/10, 900 mL of transudative fluid was drained. -Back in the ED 12/29, reported 2 to 3 days of progressive dyspnea on exertion with cough orthopnea and nausea vomiting. -In the ED hypertensive, hypoxic, underwent left thoracentesis by EDP 12/29, 1.1 mL of pleural fluid drained, labs noted creatinine 2.0, sodium 135, mildly elevated LFTs, BNP 804, troponin 39, 46, WBC 8.6, hemoglobin 9.8.  CT chest abdomen pelvis demonstrated large left pleural effusion, moderate right pleural effusion, small ascites, liver nodularity concerning for cirrhosis -Admitted, started on diuretics, cards following -Attempted repeat thoracentesis in IR 1/4, 750 mL drained, procedure had to be stopped on account of symptoms, perIR PA at least 2 L present   Subjective: -Feels better overall, still with some nausea, trying to have a bowel movement  Assessment and Plan:  Acute on chronic diastolic CHF Recurrent pleural effusions left> right -Last echo 12/24 with EF 60-65%, normal RV, moderate pericardial effusion -Recent admission for same, baseline on torsemide  20 Mg daily with poor compliance, underwent left thoracentesis prior admission 12/10, cytology negative -Repeat L thoracentesis in ED 12/30, 1.1 L drained, again transudative, despite pleural fluid LDH being high, serum LDH is considerably elevated from CML, cytology negative -Slowly diuresing, weight down 33 LB, urine output inaccurate ,  -1/7 repeat thoracentesis , protein ratio suggests exudative fluid, LDH suggest transudative, follow-up  cytology -Continue IV Lasix , now on daily metolazone  -Suspect cirrhosis and hypoalbuminemia contributing to recurrent effusions, I worry her prognosis is poor -GDMT limited by CKD -Wean off O2 today, repeat x-ray  Cirrhosis, noted on imaging CT abdomen 12/29 Hypoalbuminemia -Contributing to fluid overloaded state -Diuretics as above  Acute hypoxic respiratory failure -Secondary to diastolic CHF, pleural effusions left> right -As above, wean O2 -See above  Acute on chronic anemia History of CML, on Gleevec  -Anemia panel suggestive of chronic disease -Hemoglobin relatively stable in the 7.5- 9 range, multifactorial from chronic kidney disease, CML & Gleevec , transfuse if Hb trends down further  History of chronic nausea and vomiting -I suspect this is multifactorial, very common side effect of Gleevec , also suspect a posttussive component to her vomiting -Supportive care,   Mechanical fall, generalized weakness -Likely secondary to dyspnea and hypoxia -Imaging unremarkable for injuries, PT OT consult  AKI on CKD 3B,  -baseline creatinine around 1.6-2 -Relatively stable, monitor with diuresis  Abnormal LFTs -Could be related to passive hepatic congestion, Gleevac, improving  History of ventricular tachycardia On amiodarone   Hypothyroidism Continue Synthroid  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, needs further follow-up  DVT prophylaxis: lovenox  Code Status: Full Code Family Communication: None present Disposition Plan: Home with home health services  Consultants:    Procedures:   Antimicrobials:    Objective: Vitals:   08/17/23 0006 08/17/23 0259 08/17/23 0414 08/17/23 0752  BP:   (!) 111/52 (!) 122/46  Pulse:   65 67  Resp:   17 20  Temp: 97.8 F (36.6 C)  98.5 F (36.9 C) 98.2 F (36.8 C)  TempSrc: Oral  Oral Oral  SpO2:   97% 96%  Weight:  81.7 kg    Height:        Intake/Output Summary (Last 24 hours)  at 08/17/2023 1139 Last data filed at 08/17/2023  9247 Gross per 24 hour  Intake 480 ml  Output 2300 ml  Net -1820 ml   Filed Weights   08/16/23 0410 08/17/23 0000 08/17/23 0259  Weight: 84.8 kg 81.7 kg 81.7 kg    Examination:  General exam: Obese chronically ill female sitting up on the bedside commode, AAOx3 HEENT: Positive JVD CVS: S1-S2, regular rhythm Lungs: Decreased breath sounds on left Abdomen: Soft, nontender, bowel sounds present Extremities: Trace edema, chronic skin changes and deformities  Psychiatry:  Mood & affect appropriate.     Data Reviewed:   CBC: Recent Labs  Lab 08/11/23 0341 08/12/23 0247 08/13/23 0326 08/15/23 0257 08/16/23 0232  WBC 5.8 5.0 4.9 5.2 4.9  HGB 7.7* 8.0* 8.2* 8.0* 8.5*  HCT 24.2* 26.0* 25.6* 24.5* 26.5*  MCV 98.0 101.2* 98.5 98.0 97.8  PLT 196 137* 203 201 206   Basic Metabolic Panel: Recent Labs  Lab 08/13/23 0326 08/14/23 0237 08/15/23 0257 08/16/23 0232 08/17/23 0306  NA 135 138 137 135 136  K 4.1 4.3 4.0 3.6 3.4*  CL 98 98 97* 94* 92*  CO2 31 32 31 32 33*  GLUCOSE 95 101* 107* 99 116*  BUN 21 23 23 21 22   CREATININE 1.76* 1.67* 1.76* 1.71* 1.80*  CALCIUM  7.9* 8.1* 8.2* 8.1* 8.4*   GFR: Estimated Creatinine Clearance: 23.8 mL/min (A) (by C-G formula based on SCr of 1.8 mg/dL (H)). Liver Function Tests: Recent Labs  Lab 08/10/23 1524  AST 168*  ALT 167*  ALKPHOS 78  BILITOT 0.8  PROT 5.2*  ALBUMIN  2.4*   No results for input(s): LIPASE, AMYLASE in the last 168 hours. No results for input(s): AMMONIA in the last 168 hours. Coagulation Profile: No results for input(s): INR, PROTIME in the last 168 hours.  Cardiac Enzymes: No results for input(s): CKTOTAL, CKMB, CKMBINDEX, TROPONINI in the last 168 hours. BNP (last 3 results) No results for input(s): PROBNP in the last 8760 hours. HbA1C: No results for input(s): HGBA1C in the last 72 hours. CBG: No results for input(s): GLUCAP in the last 168 hours. Lipid Profile: No  results for input(s): CHOL, HDL, LDLCALC, TRIG, CHOLHDL, LDLDIRECT in the last 72 hours. Thyroid  Function Tests: No results for input(s): TSH, T4TOTAL, FREET4, T3FREE, THYROIDAB in the last 72 hours.  Anemia Panel: No results for input(s): VITAMINB12, FOLATE, FERRITIN, TIBC, IRON, RETICCTPCT in the last 72 hours.  Urine analysis:    Component Value Date/Time   COLORURINE YELLOW 08/09/2023 0742   APPEARANCEUR CLEAR 08/09/2023 0742   LABSPEC 1.009 08/09/2023 0742   PHURINE 5.0 08/09/2023 0742   GLUCOSEU NEGATIVE 08/09/2023 0742   HGBUR NEGATIVE 08/09/2023 0742   BILIRUBINUR NEGATIVE 08/09/2023 0742   BILIRUBINUR negative 06/04/2016 0847   BILIRUBINUR neg 02/14/2015 1431   KETONESUR NEGATIVE 08/09/2023 0742   PROTEINUR NEGATIVE 08/09/2023 0742   UROBILINOGEN 0.2 06/04/2016 0847   NITRITE NEGATIVE 08/09/2023 0742   LEUKOCYTESUR TRACE (A) 08/09/2023 0742   Sepsis Labs: @LABRCNTIP (procalcitonin:4,lacticidven:4)  ) Recent Results (from the past 240 hours)  Body fluid culture w Gram Stain     Status: None   Collection Time: 08/08/23  9:34 PM   Specimen: Pleural Fluid  Result Value Ref Range Status   Specimen Description FLUID PLEURAL  Final   Special Requests NONE  Final   Gram Stain NO WBC SEEN NO ORGANISMS SEEN   Final   Culture   Final    NO GROWTH 3 DAYS Performed  at [facility], [address] [place], [place] [address]    Report Status 08/12/2023 FINAL  Final  Blood culture (routine x 2)     Status: None   Collection Time: 08/08/23  9:36 PM   Specimen: BLOOD RIGHT ARM  Result Value Ref Range Status   Specimen Description BLOOD RIGHT ARM  Final   Special Requests   Final    BOTTLES DRAWN AEROBIC AND ANAEROBIC Blood Culture adequate volume   Culture   Final    NO GROWTH 5 DAYS Performed at [facility], [address], [place], [place] [address]    Report Status 08/13/2023 FINAL  Final  Blood culture (routine x 2)      Status: None   Collection Time: 08/08/23  9:36 PM   Specimen: BLOOD LEFT HAND  Result Value Ref Range Status   Specimen Description BLOOD LEFT HAND  Final   Special Requests   Final    BOTTLES DRAWN AEROBIC AND ANAEROBIC Blood Culture adequate volume   Culture   Final    NO GROWTH 5 DAYS Performed at [facility], [address] [place], [place] [address]    Report Status 08/13/2023 FINAL  Final  Culture, body fluid w Gram Stain-bottle     Status: None (Preliminary result)   Collection Time: 08/14/23  1:08 PM   Specimen: Pleura  Result Value Ref Range Status   Specimen Description PLEURAL  Final   Special Requests NONE  Final   Culture   Final    NO GROWTH 3 DAYS Performed at [facility], [address] [place], [place] [address]    Report Status PENDING  Incomplete  Gram stain     Status: None   Collection Time: 08/14/23  1:08 PM   Specimen: Pleura  Result Value Ref Range Status   Specimen Description PLEURAL  Final   Special Requests NONE  Final   Gram Stain   Final    WBC PRESENT, PREDOMINANTLY MONONUCLEAR NO ORGANISMS SEEN CYTOSPIN SMEAR Performed at [facility], [address] [place], [place] [address]    Report Status 08/14/2023 FINAL  Final     Radiology Studies: No results found.    Scheduled Meds:  amiodarone   200 mg Oral Daily   aspirin  EC  81 mg Oral Daily   bisacodyl   10 mg Rectal Once   enoxaparin  (LOVENOX ) injection  30 mg Subcutaneous Q24H   furosemide   80 mg Intravenous BID   imatinib   400 mg Oral Daily   lidocaine   1 patch Transdermal Q24H   metolazone   5 mg Oral Daily   metoprolol  succinate  12.5 mg Oral Daily   polyethylene glycol  17 g Oral BID   potassium chloride   40 mEq Oral BID   thyroid   15 mg Oral QAC breakfast   timolol   1 drop Left Eye BID   Continuous Infusions:   LOS: 9 days    Time spent:    Sigurd Pac, MD [facility]   08/17/2023, 11:39 AM

## 2023-08-17 NOTE — Progress Notes (Signed)
 Occupational Therapy Treatment Patient Details Name: Lisa [name] MRN: [id number] DOB: 02/26/1941 Today's Date: 08/17/2023   History of present illness Pt is an 83 year old woman admitted 10/08/2022 with suspected acute on chronic diastolic heart failure after presenting from home complaining of shortness of breath. PMH includes: + for pseudomonas, CHF exacerbation, L pleural effusion. 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   OT comments  Pt reports fatigue and nausea today, but agreeable to EOB and OOB activity. Pt required CGA to sit EOB, Sup with grooming and UB dressing tasks, CGA to stand to RW for SPTs to [facility] and recliner. OT will continue to follow pt acutely to maximize level of function and safety      If plan is discharge home, recommend the following:  A little help with walking and/or transfers;A little help with bathing/dressing/bathroom;Assistance with cooking/housework   Equipment Recommendations  None recommended by OT    Recommendations for Other Services      Precautions / Restrictions Precautions Precautions: Fall Precaution Comments: watch O2, on chemo meds Restrictions Weight Bearing Restrictions Per Provider Order: No       Mobility Bed Mobility Overal bed mobility: Needs Assistance Bed Mobility: Sit to Supine     Supine to sit: Contact guard, Used rails, HOB elevated          Transfers Overall transfer level: Needs assistance Equipment used: Rolling walker (2 wheels), 1 person hand held assist Transfers: Sit to/from Stand, Bed to chair/wheelchair/BSC Sit to Stand: Contact guard assist     Step pivot transfers: Contact guard assist           Balance Overall balance assessment: Needs assistance Sitting-balance support: Feet unsupported, Bilateral upper extremity supported Sitting balance-Leahy Scale: Good     Standing balance support: Bilateral upper extremity supported, During functional  activity Standing balance-Leahy Scale: Fair                             ADL either performed or assessed with clinical judgement   ADL Overall ADL's : Needs assistance/impaired     Grooming: Wash/dry hands;Wash/dry face;Supervision/safety;Sitting           Upper Body Dressing : Set up;Supervision/safety;Sitting       Toilet Transfer: Contact guard assist;Stand-pivot;Rolling walker (2 wheels);BSC/3in1   Toileting- Clothing Manipulation and Hygiene: Minimal assistance;Sit to/from stand       Functional mobility during ADLs: Contact guard assist;Rolling walker (2 wheels)      Extremity/Trunk Assessment Upper Extremity Assessment Upper Extremity Assessment: Generalized weakness   Lower Extremity Assessment Lower Extremity Assessment: Defer to PT evaluation   Cervical / Trunk Assessment Cervical / Trunk Assessment: Normal;Other exceptions Cervical / Trunk Exceptions: chronic lymphedema, body habitus, chronic O2    Vision Baseline Vision/History: 0 No visual deficits Ability to See in Adequate Light: 0 Adequate Patient Visual Report: No change from baseline     Perception     Praxis      Cognition Arousal: Alert Behavior During Therapy: WFL for tasks assessed/performed Overall Cognitive Status: Within Functional Limits for tasks assessed                                          Exercises      Shoulder Instructions       General Comments  Pertinent Vitals/ Pain       Pain Assessment Pain Assessment: No/denies pain Pain Intervention(s): Monitored during session, Repositioned  Home Living                                          Prior Functioning/Environment              Frequency           Progress Toward Goals  OT Goals(current goals can now be found in the care plan section)  Progress towards OT goals: Progressing toward goals     Plan      Co-evaluation                  AM-PAC OT 6 Clicks Daily Activity     Outcome Measure   Help from another person eating meals?: None Help from another person taking care of personal grooming?: A Little Help from another person toileting, which includes using toliet, bedpan, or urinal?: A Little Help from another person bathing (including washing, rinsing, drying)?: A Little Help from another person to put on and taking off regular upper body clothing?: A Little Help from another person to put on and taking off regular lower body clothing?: A Lot 6 Click Score: 18    End of Session Equipment Utilized During Treatment: Rolling walker (2 wheels)  OT Visit Diagnosis: Unsteadiness on feet (R26.81);Other abnormalities of gait and mobility (R26.89);Other (comment);Muscle weakness (generalized) (M62.81)   Activity Tolerance Patient limited by fatigue   Patient Left with call bell/phone within reach;in chair   Nurse Communication          Time: 8680-8663 OT Time Calculation (min): 17 min  Charges: OT General Charges $OT Visit: 1 Visit OT Treatments $Therapeutic Activity: 8-22 mins    [name] 08/17/2023, 2:15 PM

## 2023-08-17 NOTE — Progress Notes (Addendum)
 Patient Name: Lisa [name] Date of Encounter: 08/17/2023 [facility] Cardiologist: Vina Gull, MD   Interval Summary  .    Patient laying comfortably in bed. She reports that this morning she stopped using any supplemental oxygen. She did have one episode of emesis, which she thinks could be due to stopping the oxygen. She has since gotten Zofran  and has been resting. She states that she feels much better since admission but notes that they did have to stop her most recent thoracentesis on 1/4 because due to her symptoms.   Vital Signs .    Vitals:   08/17/23 0006 08/17/23 0259 08/17/23 0414 08/17/23 0752  BP:   (!) 111/52 (!) 122/46  Pulse:   65 67  Resp:   17 20  Temp: 97.8 F (36.6 C)  98.5 F (36.9 C) 98.2 F (36.8 C)  TempSrc: Oral  Oral Oral  SpO2:   97% 96%  Weight:  81.7 kg    Height:        Intake/Output Summary (Last 24 hours) at 08/17/2023 1002 Last data filed at 08/17/2023 0752 Gross per 24 hour  Intake 480 ml  Output 2750 ml  Net -2270 ml      08/17/2023    2:59 AM 08/17/2023   12:00 AM 08/16/2023    4:10 AM  Last 3 Weights  Weight (lbs) 180 lb 1.9 oz 180 lb 1.9 oz 186 lb 15.2 oz  Weight (kg) 81.7 kg 81.7 kg 84.8 kg      Telemetry/ECG    Ventricular paced rhythm, HR 65 - Personally Reviewed  Physical Exam .   GEN: No acute distress, recent weaned off supplemental oxygen Neck: No JVD Cardiac: RRR, no murmurs.  Respiratory: diminished breath sounds on left  GI: Soft, nontender MS: Chronic lymphedema present in bilateral LE   Assessment & Plan .     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 Likely non-ischemic/mixed etiology  Last LHC (09/2019) showed non-obstructive CAD with patent LAD stent  Echo (08/09/23) showed LVEF 55-60%, no regional wall motion abnormalities, grade I diastolic dysfunction,  normal RV, large pleural effusion, IVC dilated Patient had inconsistent use of diuretics at home Most recent creatinine stable at 1.80 this morning Weight down to 180.12lb from 212lb on admission  GDMT continues to be limited due to renal function  -- Continue daily weights -- Continue Lasix  80 mg IV BID  -- Continue metolazone  5 mg dose -- Continue Toprol  12.5 mg daily  -- Consider starting SGLT2i upon discharge if GFR remains >25  -- Patient to work with PT/OT, increase her activity and continue off oxygen Recent potassium 3.4  -- Supplemental potassium 40 mEq x 2 doses ordered  -- Continue to monitor BMP  Recurrent pleural effusions Patient has underwent repeat thoracentesis since admission, all resulting transudative, nonmalignant   -- Treatment per primary   History of VT/VF CRT-D in situ Device interrogation from 08/02/23 showed normal function -- Continue amiodarone  200 mg daily  -- Continue Toprol  12.5 mg daily   For questions or updates, please contact [facility] Please consult [email or web address] for contact info under      Signed, Waddell DELENA Donath, PA-C   Personally seen and examined. Agree with APP above with the following comments: Worsening nausea.  Working to wean O2.  Still has  LE edema. CXR still shows significant left sided effusion. Pacing on telemetry.  - decreased BS on the left side.  Chronic wounds on R foot are well dressed.  Rest as above. - will continue aggressive Diuresis.  IF nausea improved will increase IV lasix  dose tomorrow if stable kidney function - I will not start SGLT2i this admission; she is high risk for UTI  Stanly Leavens, MD FASE [facility] Cardiologist [facility]  [address], [address] [place], [place] [address] [phone]  1:20 PM

## 2023-08-18 LAB — COMPREHENSIVE METABOLIC PANEL
ALT: 41 U/L (ref 0–44)
AST: 39 U/L (ref 15–41)
Albumin: 2.6 g/dL — ABNORMAL LOW (ref 3.5–5.0)
Alkaline Phosphatase: 61 U/L (ref 38–126)
Anion gap: 9 (ref 5–15)
BUN: 22 mg/dL (ref 8–23)
CO2: 35 mmol/L — ABNORMAL HIGH (ref 22–32)
Calcium: 8.4 mg/dL — ABNORMAL LOW (ref 8.9–10.3)
Chloride: 91 mmol/L — ABNORMAL LOW (ref 98–111)
Creatinine, Ser: 2 mg/dL — ABNORMAL HIGH (ref 0.44–1.00)
GFR, Estimated: 25 mL/min — ABNORMAL LOW (ref >60)
Glucose, Bld: 100 mg/dL — ABNORMAL HIGH (ref 70–99)
Potassium: 4.3 mmol/L (ref 3.5–5.1)
Sodium: 135 mmol/L (ref 135–145)
Total Bilirubin: 1.1 mg/dL (ref 0.0–1.2)
Total Protein: 5.2 g/dL — ABNORMAL LOW (ref 6.5–8.1)

## 2023-08-18 LAB — CBC
HCT: 26.7 % — ABNORMAL LOW (ref 36.0–46.0)
Hemoglobin: 8.8 g/dL — ABNORMAL LOW (ref 12.0–15.0)
MCH: 32.2 pg (ref 26.0–34.0)
MCHC: 33 g/dL (ref 30.0–36.0)
MCV: 97.8 fL (ref 80.0–100.0)
Platelets: 253 10*3/uL (ref 150–400)
RBC: 2.73 MIL/uL — ABNORMAL LOW (ref 3.87–5.11)
RDW: 17 % — ABNORMAL HIGH (ref 11.5–15.5)
WBC: 5.8 10*3/uL (ref 4.0–10.5)
nRBC: 0 % (ref 0.0–0.2)

## 2023-08-18 MED ORDER — POLYETHYLENE GLYCOL 3350 17 G PO PACK: 17.0000 g | PACK | Freq: Every day | ORAL | Status: DC | PRN

## 2023-08-18 MED ORDER — POTASSIUM CHLORIDE 20 MEQ PO PACK
40.0000 meq | PACK | Freq: Every day | ORAL | Status: DC
  Administered 2023-08-19 – 2023-08-20 (×2): 40 meq via ORAL
  Filled 2023-08-18 (×2): qty 2

## 2023-08-18 MED ORDER — TORSEMIDE 20 MG PO TABS
20.0000 mg | ORAL_TABLET | Freq: Two times a day (BID) | ORAL | Status: DC
  Administered 2023-08-18 – 2023-08-19 (×3): 20 mg via ORAL
  Filled 2023-08-18 (×4): qty 1

## 2023-08-18 NOTE — Progress Notes (Signed)
 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 -Back in the ED 12/29, reported 2 to 3 days of progressive dyspnea on exertion with cough orthopnea and nausea vomiting. -In the ED hypertensive, hypoxic, underwent left thoracentesis by EDP 12/29, 1.1 mL of pleural fluid drained, labs noted creatinine 2.0, sodium 135, mildly elevated LFTs, BNP 804, troponin 39, 46, WBC 8.6, hemoglobin 9.8.  CT chest abdomen pelvis demonstrated large left pleural effusion, moderate right pleural effusion, small ascites, liver nodularity concerning for cirrhosis -Admitted, started on diuretics, cards following -Attempted repeat thoracentesis in IR 1/4, 750 mL drained, procedure had to be stopped on account of symptoms, perIR PA at least 2 L present   Subjective: -Feels better overall, still with some nausea, trying to have a bowel movement  Assessment and Plan:  Acute on chronic diastolic CHF Recurrent pleural effusions left> right -Last echo 12/24 with EF 60-65%, normal RV, moderate pericardial effusion -Recent admission for same, baseline on torsemide  20 Mg daily with poor compliance, underwent left thoracentesis prior admission 12/10, cytology negative -Repeat L thoracentesis in ED 12/30, 1.1 L drained, again transudative, despite pleural fluid LDH being high, serum LDH is considerably elevated from CML, cytology negative, cultures negative -1/7 repeat thoracentesis , protein ratio suggests exudative fluid, LDH suggest transudative, cytology negative again, was negative -Diuresing on IV Lasix  and  metolazone , 33 LB down, mild uptrend in creatinine will hold metolazone  today -Suspect cirrhosis and hypoalbuminemia contributing to recurrent effusions, I worry her prognosis is poor -GDMT limited by CKD -Weaned off O2  Cirrhosis, noted on imaging CT abdomen 12/29 Hypoalbuminemia -Contributing to fluid overloaded state -Diuretics as above  Acute hypoxic respiratory failure -Secondary to diastolic CHF, pleural effusions left> right -As above, weaned off O2 -See above  Acute on chronic anemia History of CML, on Gleevec  -Anemia panel suggestive of chronic disease -Hemoglobin relatively stable in the 7.5- 9 range, multifactorial from chronic kidney disease, CML & Gleevec , now stable  History of chronic nausea and vomiting -I suspect this is multifactorial, very common side effect of Gleevec , also has a significant posttussive component to her vomiting -Supportive care,   Mechanical fall, generalized weakness -Likely secondary to dyspnea and hypoxia -Imaging unremarkable for injuries, PT OT consult  AKI on CKD 3B,  -baseline creatinine around 1.6-2 -Mild uptrend, see above  Abnormal LFTs -Could be related to passive hepatic congestion, Gleevac, improving  History of ventricular tachycardia On amiodarone   Hypothyroidism Continue Synthroid  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, needs further follow-up  DVT prophylaxis: lovenox  Code Status: Full Code Family Communication: None present Disposition Plan: Home with home health services  Consultants:    Procedures:   Antimicrobials:    Objective: Vitals:   08/17/23 2005 08/18/23 0120 08/18/23 0454 08/18/23 0837  BP: (!) 106/45 (!) 116/43 (!) 110/37 (!) 99/44  Pulse: 72 69 73 74  Resp: 20 17 20    Temp: 98.1 F (36.7 C)  98 F (36.7 C) 98 F (36.7 C)  TempSrc: Oral  Oral Oral  SpO2: 93% 96% 94% 92%  Weight:   81 kg   Height:        Intake/Output Summary (Last 24 hours)  at 08/18/2023 1018 Last data filed at  08/18/2023 0511 Gross per 24 hour  Intake --  Output 800 ml  Net -800 ml   Filed Weights   08/17/23 0000 08/17/23 0259 08/18/23 0454  Weight: 81.7 kg 81.7 kg 81 kg    Examination:  General exam: Obese chronically ill female sitting up in bed, AAOx3 HEENT: Positive JVD CVS: S1-S2, regular rhythm Lungs: Decreased breath sounds at the base on the left  Abdomen: Soft, nontender, bowel sounds present Extremities: Trace edema, chronic skin changes and deformities  Psychiatry:  Mood & affect appropriate.     Data Reviewed:   CBC: Recent Labs  Lab 08/12/23 0247 08/13/23 0326 08/15/23 0257 08/16/23 0232 08/18/23 0320  WBC 5.0 4.9 5.2 4.9 5.8  HGB 8.0* 8.2* 8.0* 8.5* 8.8*  HCT 26.0* 25.6* 24.5* 26.5* 26.7*  MCV 101.2* 98.5 98.0 97.8 97.8  PLT 137* 203 201 206 253   Basic Metabolic Panel: Recent Labs  Lab 08/14/23 0237 08/15/23 0257 08/16/23 0232 08/17/23 0306 08/18/23 0320  NA 138 137 135 136 135  K 4.3 4.0 3.6 3.4* 4.3  CL 98 97* 94* 92* 91*  CO2 32 31 32 33* 35*  GLUCOSE 101* 107* 99 116* 100*  BUN 23 23 21 22 22   CREATININE 1.67* 1.76* 1.71* 1.80* 2.00*  CALCIUM  8.1* 8.2* 8.1* 8.4* 8.4*   GFR: Estimated Creatinine Clearance: 21.3 mL/min (A) (by C-G formula based on SCr of 2 mg/dL (H)). Liver Function Tests: Recent Labs  Lab 08/18/23 0320  AST 39  ALT 41  ALKPHOS 61  BILITOT 1.1  PROT 5.2*  ALBUMIN  2.6*   No results for input(s): LIPASE, AMYLASE in the last 168 hours. No results for input(s): AMMONIA in the last 168 hours. Coagulation Profile: No results for input(s): INR, PROTIME in the last 168 hours.  Cardiac Enzymes: No results for input(s): CKTOTAL, CKMB, CKMBINDEX, TROPONINI in the last 168 hours. BNP (last 3 results) No results for input(s): PROBNP in the last 8760 hours. HbA1C: No results for input(s): HGBA1C in the last 72 hours. CBG: No results for input(s): GLUCAP in the last 168 hours. Lipid Profile: No  results for input(s): CHOL, HDL, LDLCALC, TRIG, CHOLHDL, LDLDIRECT in the last 72 hours. Thyroid  Function Tests: No results for input(s): TSH, T4TOTAL, FREET4, T3FREE, THYROIDAB in the last 72 hours.  Anemia Panel: No results for input(s): VITAMINB12, FOLATE, FERRITIN, TIBC, IRON, RETICCTPCT in the last 72 hours.  Urine analysis:    Component Value Date/Time   COLORURINE YELLOW 08/09/2023 0742   APPEARANCEUR CLEAR 08/09/2023 0742   LABSPEC 1.009 08/09/2023 0742   PHURINE 5.0 08/09/2023 0742   GLUCOSEU NEGATIVE 08/09/2023 0742   HGBUR NEGATIVE 08/09/2023 0742   BILIRUBINUR NEGATIVE 08/09/2023 0742   BILIRUBINUR negative 06/04/2016 0847   BILIRUBINUR neg 02/14/2015 1431   KETONESUR NEGATIVE 08/09/2023 0742   PROTEINUR NEGATIVE 08/09/2023 0742   UROBILINOGEN 0.2 06/04/2016 0847   NITRITE NEGATIVE 08/09/2023 0742   LEUKOCYTESUR TRACE (A) 08/09/2023 0742   Sepsis Labs: @LABRCNTIP (procalcitonin:4,lacticidven:4)  ) Recent Results (from the past 240 hours)  Body fluid culture w Gram Stain     Status: None   Collection Time: 08/08/23  9:34 PM   Specimen: Pleural Fluid  Result Value Ref Range Status   Specimen Description FLUID PLEURAL  Final   Special Requests NONE  Final   Gram Stain NO WBC SEEN NO ORGANISMS SEEN   Final   Culture   Final    NO GROWTH 3  DAYS Performed at [facility], [address] [place], [place] [address]    Report Status 08/12/2023 FINAL  Final  Blood culture (routine x 2)     Status: None   Collection Time: 08/08/23  9:36 PM   Specimen: BLOOD RIGHT ARM  Result Value Ref Range Status   Specimen Description BLOOD RIGHT ARM  Final   Special Requests   Final    BOTTLES DRAWN AEROBIC AND ANAEROBIC Blood Culture adequate volume   Culture   Final    NO GROWTH 5 DAYS Performed at [facility], [address] [place], [place] [address]    Report Status 08/13/2023 FINAL  Final  Blood culture (routine x 2)      Status: None   Collection Time: 08/08/23  9:36 PM   Specimen: BLOOD LEFT HAND  Result Value Ref Range Status   Specimen Description BLOOD LEFT HAND  Final   Special Requests   Final    BOTTLES DRAWN AEROBIC AND ANAEROBIC Blood Culture adequate volume   Culture   Final    NO GROWTH 5 DAYS Performed at [facility], [address], [place], [place] [address]    Report Status 08/13/2023 FINAL  Final  Culture, body fluid w Gram Stain-bottle     Status: None (Preliminary result)   Collection Time: 08/14/23  1:08 PM   Specimen: Pleura  Result Value Ref Range Status   Specimen Description PLEURAL  Final   Special Requests NONE  Final   Culture   Final    NO GROWTH 4 DAYS Performed at [facility], [address], [place], [place] [address]    Report Status PENDING  Incomplete  Gram stain     Status: None   Collection Time: 08/14/23  1:08 PM   Specimen: Pleura  Result Value Ref Range Status   Specimen Description PLEURAL  Final   Special Requests NONE  Final   Gram Stain   Final    WBC PRESENT, PREDOMINANTLY MONONUCLEAR NO ORGANISMS SEEN CYTOSPIN SMEAR Performed at [facility], [address] [place], [place] [address]    Report Status 08/14/2023 FINAL  Final     Radiology Studies: DG Chest 2 View Result Date: 08/17/2023 CLINICAL DATA:  Shortness of breath and CHF. EXAM: CHEST - 2 VIEW COMPARISON:  Chest radiograph dated 08/14/2023. FINDINGS: Similar size moderate left pleural effusion and compressive atelectasis of the left lower lobe. The right lung is clear. No pneumothorax. Stable cardiomediastinal silhouette. Atherosclerotic calcification of the aorta. Left pectoral AICD device. No acute osseous pathology. IMPRESSION: Similar size moderate left pleural effusion and compressive atelectasis of the left lower lobe. Electronically Signed   By: Vanetta Chou M.D.   On: 08/17/2023 14:13      Scheduled Meds:  amiodarone   200 mg Oral Daily   aspirin  EC  81 mg  Oral Daily   bisacodyl   10 mg Rectal Once   enoxaparin  (LOVENOX ) injection  30 mg Subcutaneous Q24H   furosemide   80 mg Intravenous BID   imatinib   400 mg Oral Daily   lidocaine   1 patch Transdermal Q24H   metoprolol  succinate  12.5 mg Oral Daily   thyroid   15 mg Oral QAC breakfast   timolol   1 drop Left Eye BID   Continuous Infusions:   LOS: 10 days    Time spent:    Sigurd Pac, MD [facility]   08/18/2023, 10:18 AM

## 2023-08-18 NOTE — Progress Notes (Signed)
 Physical Therapy Treatment Patient Details Name: Lisa [name] MRN: [id number] DOB: 1941-04-18 Today's Date: 08/18/2023   History of Present Illness Pt is an 83 year old woman admitted 10/08/2022 with suspected acute on chronic diastolic heart failure after presenting from home complaining of shortness of breath. PMH includes: + for pseudomonas, CHF exacerbation, L pleural effusion. 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    PT Comments  Patient remains highly motivated to participate in therapy despite complications of diarrhea today. Pt completed bed mobility with supervision for supine>sit and CGA for stand pivot transfer bed>BSC to void B&B. Pt required assist for pericare in standing and following toileting agreeable to exercises. Pt amb 3x 25' with RW in room with seated rest break. HR elevating to high 80's with gait and recovering to low 70's at rest. Pt performed standing and sitting exercises with HR max of 103 bpm and recovering to 70's on return to supine in bed. Will continue to progress pt as able.    If plan is discharge home, recommend the following: A little help with walking and/or transfers;A little help with bathing/dressing/bathroom;Assistance with cooking/housework;Assist for transportation;Help with stairs or ramp for entrance   Can travel by private vehicle        Equipment Recommendations  None recommended by PT    Recommendations for Other Services       Precautions / Restrictions Precautions Precautions: Fall Restrictions Weight Bearing Restrictions Per Provider Order: No     Mobility  Bed Mobility Overal bed mobility: Needs Assistance Bed Mobility: Sit to Supine       Sit to supine: Min assist   General bed mobility comments: min assist to bring LE's onto bed, pt able to control lowering trunk and scoot to center self with return to supine.    Transfers Overall transfer level: Needs assistance Equipment used:  Rolling walker (2 wheels), 1 person hand held assist Transfers: Sit to/from Stand, Bed to chair/wheelchair/BSC Sit to Stand: Contact guard assist   Step pivot transfers: Contact guard assist       General transfer comment: CGA for rise from recliner. pt reliant on UE use to power up to stand. Pt able to place hands on knees to faciliate LE strengthening for slow eccentric lower to sit. Repeated sit<>stand completed for strengthening. Pt performed step pivot transfer with HHA/CGA chair>BSC>chair.    Ambulation/Gait Ambulation/Gait assistance: Contact guard assist   Assistive device: Rolling walker (2 wheels) Gait Pattern/deviations: Step-through pattern, Decreased stride length, Wide base of support Gait velocity: decr     General Gait Details: CGA for safety with RW, pt with wide BOS to stabilize balance. pace slow and cautious, no LOB noted.   Stairs             Wheelchair Mobility     Tilt Bed    Modified Rankin (Stroke Patients Only)       Balance Overall balance assessment: Needs assistance Sitting-balance support: Feet unsupported, Bilateral upper extremity supported Sitting balance-Leahy Scale: Good     Standing balance support: Bilateral upper extremity supported, During functional activity Standing balance-Leahy Scale: Fair                              Cognition Arousal: Alert Behavior During Therapy: WFL for tasks assessed/performed Overall Cognitive Status: Within Functional Limits for tasks assessed  Exercises General Exercises - Lower Extremity Long Arc Quad: AROM, Both, 10 reps, Standing, Seated Hip ABduction/ADduction: AROM, Both, 10 reps, Standing, Seated Hip Flexion/Marching: AROM, Both, 10 reps, Standing Heel Raises: AROM, Both, 10 reps, Standing    General Comments        Pertinent Vitals/Pain Pain Assessment Pain Assessment: Faces Faces Pain Scale: Hurts  little more Pain Location: Rt buttocks/hip (sciatica) Pain Descriptors / Indicators: Discomfort Pain Intervention(s): Limited activity within patient's tolerance, Monitored during session, Repositioned    Home Living                          Prior Function            PT Goals (current goals can now be found in the care plan section) Acute Rehab PT Goals Patient Stated Goal: get well go home PT Goal Formulation: With patient Time For Goal Achievement: 08/24/23 Potential to Achieve Goals: Good Progress towards PT goals: Progressing toward goals    Frequency    Min 1X/week      PT Plan      Co-evaluation              AM-PAC PT 6 Clicks Mobility   Outcome Measure  Help needed turning from your back to your side while in a flat bed without using bedrails?: None Help needed moving from lying on your back to sitting on the side of a flat bed without using bedrails?: A Little Help needed moving to and from a bed to a chair (including a wheelchair)?: A Little Help needed standing up from a chair using your arms (e.g., wheelchair or bedside chair)?: A Little Help needed to walk in hospital room?: A Little Help needed climbing 3-5 steps with a railing? : A Lot 6 Click Score: 18    End of Session Equipment Utilized During Treatment: Gait belt Activity Tolerance: Patient tolerated treatment well Patient left: in chair;with call bell/phone within reach Nurse Communication: Mobility status PT Visit Diagnosis: Difficulty in walking, not elsewhere classified (R26.2);Muscle weakness (generalized) (M62.81);Unsteadiness on feet (R26.81)     Time: 8497-8456 PT Time Calculation (min) (ACUTE ONLY): 41 min  Charges:    $Therapeutic Exercise: 23-37 mins $Therapeutic Activity: 8-22 mins PT General Charges $$ ACUTE PT VISIT: 1 Visit                     Vernell DONEEN KLEIN, DPT Acute Rehabilitation Services Office [phone]  08/18/23 4:00 PM

## 2023-08-18 NOTE — Progress Notes (Signed)
 Mobility Specialist Progress Note:    08/18/23 1055  Mobility  Activity Transferred to/from [facility]  Level of Assistance Contact guard assist, steadying assist  Assistive Device Front wheel walker  Distance Ambulated (ft) 5 ft  Activity Response Tolerated well  Mobility Referral Yes  Mobility visit 1 Mobility  Mobility Specialist Start Time (ACUTE ONLY) [id number]  Mobility Specialist Stop Time (ACUTE ONLY) 0936  Mobility Specialist Time Calculation (min) (ACUTE ONLY) 13 min   Pt received in bed eager for mobility. Pt was able to get to EOB independently. Contact guard for STS. Pt perform x3 STS w/o fault. Requested to use the [facility]. Requested some privacy. Left on [facility] w/ call bell in reach. All needs met. NT aware.  Thersia Minder Mobility Specialist  Please contact vis Secure Chat or  Rehab Office [phone]

## 2023-08-18 NOTE — Progress Notes (Signed)
 Progress Note  Patient Name: Lisa [name] Date of Encounter: 08/18/2023 Primary Cardiologist: Vina Gull, MD   Subjective   Overnight creatinine increased. Patient was able to wean off O2. Has some concerns about getting her strength back in her legs  Vital Signs    Vitals:   08/18/23 0120 08/18/23 0454 08/18/23 0837 08/18/23 1000  BP: (!) 116/43 (!) 110/37 (!) 99/44 (!) 104/50  Pulse: 69 73 74 77  Resp: 17 20  (!) 22  Temp:  98 F (36.7 C) 98 F (36.7 C)   TempSrc:  Oral Oral   SpO2: 96% 94% 92%   Weight:  81 kg    Height:        Intake/Output Summary (Last 24 hours) at 08/18/2023 1054 Last data filed at 08/18/2023 1000 Gross per 24 hour  Intake 200 ml  Output 950 ml  Net -750 ml   Filed Weights   08/17/23 0000 08/17/23 0259 08/18/23 0454  Weight: 81.7 kg 81.7 kg 81 kg    Physical Exam   GEN: No acute distress.  Chronic ill appearance Neck: No JVD Cardiac: RRR, no murmurs, rubs, or gallops.  Respiratory: No tachypnea, no increased WOB GI: Soft, nontender, non-distended  MS: trace edema  Labs   Telemetry: V-Pced   Chemistry Recent Labs  Lab 08/16/23 0232 08/17/23 0306 08/18/23 0320  NA 135 136 135  K 3.6 3.4* 4.3  CL 94* 92* 91*  CO2 32 33* 35*  GLUCOSE 99 116* 100*  BUN 21 22 22   CREATININE 1.71* 1.80* 2.00*  CALCIUM  8.1* 8.4* 8.4*  PROT  --   --  5.2*  ALBUMIN   --   --  2.6*  AST  --   --  39  ALT  --   --  41  ALKPHOS  --   --  61  BILITOT  --   --  1.1  GFRNONAA 30* 28* 25*  ANIONGAP 9 11 9      Hematology Recent Labs  Lab 08/15/23 0257 08/16/23 0232 08/18/23 0320  WBC 5.2 4.9 5.8  RBC 2.50* 2.71* 2.73*  HGB 8.0* 8.5* 8.8*  HCT 24.5* 26.5* 26.7*  MCV 98.0 97.8 97.8  MCH 32.0 31.4 32.2  MCHC 32.7 32.1 33.0  RDW 16.9* 17.1* 17.0*  PLT 201 206 253    Cardiac EnzymesNo results for input(s): TROPONINI in the last 168 hours. No results for input(s): TROPIPOC in the last 168 hours.   BNP Recent Labs  Lab  08/16/23 0232 08/17/23 0306  BNP 246.6* 234.2*     DDimer No results for input(s): DDIMER in the last 168 hours.   Cardiac Studies   Cardiac Studies & Procedures   CARDIAC CATHETERIZATION  CARDIAC CATHETERIZATION 09/22/2019  Narrative  Mid LM to Dist LM lesion is 20% stenosed.  Previously placed Mid LAD stent (unknown type) is widely patent.  Ramus lesion is 65% stenosed.  The left ventricular systolic function is normal.  LV end diastolic pressure is moderately elevated.  The left ventricular ejection fraction is 50-55% by visual estimate.  1. Nonobstructive CAD. The stent in the mid LAD is widely patent. There is modest ostial disease in a small ramus branch that is unchanged from 2017. 2. Mild LV dysfunction. EF 50%. 3. Elevated LVEDP 23 mm Hg.  Plan; medical therapy.  Findings Coronary Findings Diagnostic  Dominance: Right  Left Main Mid LM to Dist LM lesion is 20% stenosed.  Left Anterior Descending Previously placed Mid LAD stent (unknown  type) is widely patent.  Ramus Intermedius Vessel is small. Ramus lesion is 65% stenosed.  Left Circumflex Vessel was injected. Vessel is normal in caliber. Vessel is angiographically normal.  Right Coronary Artery Vessel was injected. Vessel is normal in caliber. Vessel is angiographically normal.  Intervention  No interventions have been documented.   CARDIAC CATHETERIZATION  CARDIAC CATHETERIZATION 10/07/2015  Narrative 1. Severe stenosis mid LAD 2. Successful PTCA/DES x 1 mid LAD  Recommendation: Continue ASA and Brilinta  for at least one year. Continue beta blocker and statin. Further planning for ICD vs LifeVest per EP team.  Findings Coronary Findings Diagnostic  Dominance: Right  Left Anterior Descending Discrete.  Intervention  Mid LAD lesion PCI The pre-interventional distal flow is normal (TIMI 3). Pre-stent angioplasty was performed. A drug-eluting stent was placed. The strut is  apposed. Post-stent angioplasty was performed. The post-interventional distal flow is normal (TIMI 3). The intervention was successful. No complications occurred at this lesion. There is a 0% residual stenosis post intervention.   STRESS TESTS  MYOCARDIAL PERFUSION IMAGING 09/05/2019  Narrative  Nuclear stress EF: 47%. Visually, the EF appears to be greater than the computer generated EF of 47%.  There was no ST segment deviation noted during stress.  This is a low risk study.  The study is normal. There is no evidence of ischemia or previous infarction.  ECHOCARDIOGRAM  ECHOCARDIOGRAM COMPLETE 08/09/2023  Narrative ECHOCARDIOGRAM REPORT    Patient Name:   Lisa [name] Date of Exam: 08/09/2023 Medical Rec #:  [id number]          Height:       62.0 in Accession #:    [id number]         Weight:       206.2 lb Date of Birth:  03/19/1941           BSA:          1.937 m Patient Age:    82 years           BP:           124/48 mmHg Patient Gender: F                  HR:           67 bpm. Exam Location:  Inpatient  Procedure: 2D Echo, Cardiac Doppler and Color Doppler  Indications:    CHF  History:        Patient has prior history of Echocardiogram examinations, most recent 07/18/2023. CHF, Signs/Symptoms:Chest Pain; Risk Factors:Hypertension.  Sonographer:    Lanell Maduro Referring Phys: [id number] JUSTIN B HOWERTER  IMPRESSIONS   1. Left ventricular ejection fraction, by estimation, is 55 to 60%. The left ventricle has normal function. The left ventricle has no regional wall motion abnormalities. Left ventricular diastolic parameters are consistent with Grade I diastolic dysfunction (impaired relaxation). 2. Right ventricular systolic function is normal. The right ventricular size is normal. 3. Large pleural effusion. 4. The mitral valve is normal in structure. No evidence of mitral valve regurgitation. No evidence of mitral stenosis. 5. The aortic valve is normal in  structure. Aortic valve regurgitation is not visualized. No aortic stenosis is present. 6. The inferior vena cava is dilated in size with <50% respiratory variability, suggesting right atrial pressure of 15 mmHg.  FINDINGS Left Ventricle: Left ventricular ejection fraction, by estimation, is 55 to 60%. The left ventricle has normal function. The left ventricle has no regional wall motion abnormalities. The left  ventricular internal cavity size was normal in size. There is no left ventricular hypertrophy. Left ventricular diastolic parameters are consistent with Grade I diastolic dysfunction (impaired relaxation). Indeterminate filling pressures.  Right Ventricle: The right ventricular size is normal. No increase in right ventricular wall thickness. Right ventricular systolic function is normal.  Left Atrium: Left atrial size was normal in size.  Right Atrium: Right atrial size was normal in size.  Pericardium: There is no evidence of pericardial effusion.  Mitral Valve: The mitral valve is normal in structure. No evidence of mitral valve regurgitation. No evidence of mitral valve stenosis.  Tricuspid Valve: The tricuspid valve is normal in structure. Tricuspid valve regurgitation is not demonstrated. No evidence of tricuspid stenosis.  Aortic Valve: The aortic valve is normal in structure. Aortic valve regurgitation is not visualized. No aortic stenosis is present.  Pulmonic Valve: The pulmonic valve was normal in structure. Pulmonic valve regurgitation is not visualized. No evidence of pulmonic stenosis.  Aorta: The aortic root is normal in size and structure.  Venous: The inferior vena cava is dilated in size with less than 50% respiratory variability, suggesting right atrial pressure of 15 mmHg.  IAS/Shunts: No atrial level shunt detected by color flow Doppler.  Additional Comments: There is a large pleural effusion.   LEFT VENTRICLE PLAX 2D LVIDd:         4.80 cm       Diastology LVIDs:         4.30 cm      LV e' medial:    6.85 cm/s LV PW:         0.70 cm      LV E/e' medial:  10.8 LV IVS:        0.60 cm      LV e' lateral:   6.31 cm/s LVOT diam:     1.50 cm      LV E/e' lateral: 11.7 LV SV:         53 LV SV Index:   27 LVOT Area:     1.77 cm  LV Volumes (MOD) LV vol d, MOD A2C: 129.0 ml LV vol d, MOD A4C: 81.3 ml LV vol s, MOD A2C: 46.3 ml LV vol s, MOD A4C: 36.1 ml LV SV MOD A2C:     82.7 ml LV SV MOD A4C:     81.3 ml LV SV MOD BP:      63.6 ml  RIGHT VENTRICLE             IVC RV Basal diam:  2.40 cm     IVC diam: 2.30 cm RV S prime:     13.80 cm/s TAPSE (M-mode): 3.9 cm  LEFT ATRIUM             Index        RIGHT ATRIUM           Index LA diam:        4.60 cm 2.38 cm/m   RA Area:     12.90 cm LA Vol (A2C):   56.0 ml 28.92 ml/m  RA Volume:   23.40 ml  12.08 ml/m LA Vol (A4C):   50.8 ml 26.23 ml/m LA Biplane Vol: 56.1 ml 28.97 ml/m AORTIC VALVE LVOT Vmax:   138.00 cm/s LVOT Vmean:  95.000 cm/s LVOT VTI:    0.299 m  AORTA Ao Asc diam: 3.40 cm  MITRAL VALVE MV Area (PHT): 3.86 cm    SHUNTS MV E velocity: 73.70 cm/s  Systemic VTI:  0.30 m MV A velocity: 86.60 cm/s  Systemic Diam: 1.50 cm MV E/A ratio:  0.85  Annabella Scarce MD Electronically signed by Annabella Scarce MD Signature Date/Time: 08/09/2023/3:33:30 PM    Final             Assessment & Plan   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  Mixed etiology cardiomyopathy  - ~ 33 lbs - transitioned to torsemide  20 mg PO BID with potassium -- Continue Toprol  12.5 mg daily  - high risk of UTI on SGLT2i - GFR low enough would not trial MRA at this time - stop metolazone  - potential DC tomorrow  History of VT/VF CRT-D in situ Device interrogation from 08/02/23 showed normal function -- Continue amiodarone  200 mg daily  -- Continue Toprol  12.5 mg daily   Recurrent pleural effusions -- Treatment per primary    For questions or updates, please contact [facility] Please consult [email or web address] for contact info under Cardiology/STEMI.      Stanly Leavens, MD FASE [facility] Cardiologist [facility]  [address] [place], [address] [place], [place] [address] [phone]  10:54 AM

## 2023-08-18 NOTE — Plan of Care (Signed)
 Pt mobility improving as pt is able to get up to bedside commode with minimal assistance.

## 2023-08-19 LAB — BASIC METABOLIC PANEL
Anion gap: 8 (ref 5–15)
BUN: 26 mg/dL — ABNORMAL HIGH (ref 8–23)
CO2: 36 mmol/L — ABNORMAL HIGH (ref 22–32)
Calcium: 8.3 mg/dL — ABNORMAL LOW (ref 8.9–10.3)
Chloride: 90 mmol/L — ABNORMAL LOW (ref 98–111)
Creatinine, Ser: 2.18 mg/dL — ABNORMAL HIGH (ref 0.44–1.00)
GFR, Estimated: 22 mL/min — ABNORMAL LOW (ref >60)
Glucose, Bld: 118 mg/dL — ABNORMAL HIGH (ref 70–99)
Potassium: 3.7 mmol/L (ref 3.5–5.1)
Sodium: 134 mmol/L — ABNORMAL LOW (ref 135–145)

## 2023-08-19 LAB — AMMONIA: Ammonia: 12 umol/L (ref 9–35)

## 2023-08-19 LAB — CULTURE, BODY FLUID W GRAM STAIN -BOTTLE: Culture: NO GROWTH

## 2023-08-19 LAB — PROTIME-INR
INR: 1.1 (ref 0.8–1.2)
Prothrombin Time: 14.5 s (ref 11.4–15.2)

## 2023-08-19 MED ORDER — ALBUMIN HUMAN 25 % IV SOLN
25.0000 g | Freq: Four times a day (QID) | INTRAVENOUS | Status: AC
  Administered 2023-08-19 (×2): 25 g via INTRAVENOUS
  Filled 2023-08-19 (×2): qty 100

## 2023-08-19 NOTE — Progress Notes (Signed)
 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 -Back in the ED 12/29, reported 2 to 3 days of progressive dyspnea on exertion with cough orthopnea and nausea vomiting. -In the ED hypertensive, hypoxic, underwent left thoracentesis by EDP 12/29, 1.1 mL of pleural fluid drained, labs noted creatinine 2.0, sodium 135, mildly elevated LFTs, BNP 804, troponin 39, 46, WBC 8.6, hemoglobin 9.8.  CT chest abdomen pelvis demonstrated large left pleural effusion, moderate right pleural effusion, small ascites, liver nodularity concerning for cirrhosis -Admitted, started on diuretics, cards following -Attempted repeat thoracentesis in IR 1/4, 750 mL drained, procedure had to be stopped on account of symptoms, perIR PA at least 2 L present -Lasix  and metolazone  continued -1/8: Switch to oral torsemide   Subjective: -Feels better overall, no further vomiting, breathing better, diarrhea this morning  Assessment and Plan:  Acute on chronic diastolic CHF Recurrent pleural effusions left> right -Last echo 12/24 with EF 60-65%, normal RV, moderate pericardial effusion -Recent admission for same, baseline on torsemide  20 Mg daily with poor compliance, underwent left thoracentesis prior admission 12/10, cytology negative -Repeat L thoracentesis in ED 12/30, 1.1 L drained, again transudative, despite pleural fluid LDH being high, serum LDH is considerably elevated from CML, cytology negative, cultures negative -1/7 repeat thoracentesis , protein ratio suggests exudative fluid, LDH suggest transudative,  cytology negative again, cultures negative -Diuresed w/ IV Lasix  and metolazone , weight down 33 LB, creatinine trending up since yesterday, metolazone  held, switched to oral torsemide  -Creatinine higher, add albumin  X2 doses, unfortunately still volume overloaded, moderate L effusion on last x-ray -Suspect cirrhosis and hypoalbuminemia contributing to recurrent effusions, I worry her prognosis is poor, patient seems to be in denial -GDMT limited by CKD -Weaned off O2 -Discharge planning, home soon  Cirrhosis, noted on imaging CT abdomen 12/29 Hypoalbuminemia -Contributing to fluid overloaded state -Diuretics as above  Acute hypoxic respiratory failure -Secondary to diastolic CHF, pleural effusions left> right -As above, weaned off O2 -See above  Acute on chronic anemia History of CML, on Gleevec  -Anemia panel suggestive of chronic disease -Hemoglobin relatively stable in the 7.5- 9 range, multifactorial from chronic kidney disease, CML & Gleevec , now stable  History of chronic nausea and vomiting -I suspect this is multifactorial, very common side effect of Gleevec , also has a significant posttussive component to her vomiting -Supportive care,   Mechanical fall, generalized weakness -Likely secondary to dyspnea and hypoxia -Imaging unremarkable for injuries, PT OT consult  AKI on CKD 3B,  -baseline creatinine around 1.6-2 -Mild uptrend, see above  Abnormal LFTs -Could be related to passive hepatic congestion, Gleevac, improving  History of ventricular tachycardia On amiodarone   Hypothyroidism Continue Synthroid  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, needs further follow-up  DVT prophylaxis: lovenox  Code Status: Full Code Family Communication: None present Disposition Plan: Home with home health services  Consultants:    Procedures:   Antimicrobials:    Objective: Vitals:   08/19/23 0517 08/19/23 0800 08/19/23 0821 08/19/23 1008  BP: (!) 141/54  (!) 100/33 (!)  100/33  Pulse: 73   73  Resp: 20 20 20    Temp: 98.4 F (36.9 C)  97.8 F (36.6 C)  TempSrc: Oral  Oral   SpO2: 96%  93%   Weight: 81.4 kg     Height:        Intake/Output Summary (Last 24 hours) at 08/19/2023 1050 Last data filed at 08/19/2023 0052 Gross per 24 hour  Intake 240 ml  Output 600 ml  Net -360 ml   Filed Weights   08/17/23 0259 08/18/23 0454 08/19/23 0517  Weight: 81.7 kg 81 kg 81.4 kg    Examination:  General exam: Obese chronically ill female sitting up in bed, AAOx3 HEENT: Positive JVD CVS: S1-S2, regular rhythm Lungs: Decreased breath sounds on the left Abdomen: Soft, nontender, bowel sounds present Extremities: no edema, chronic skin changes and deformities  Psychiatry:  Mood & affect appropriate.     Data Reviewed:   CBC: Recent Labs  Lab 08/13/23 0326 08/15/23 0257 08/16/23 0232 08/18/23 0320  WBC 4.9 5.2 4.9 5.8  HGB 8.2* 8.0* 8.5* 8.8*  HCT 25.6* 24.5* 26.5* 26.7*  MCV 98.5 98.0 97.8 97.8  PLT 203 201 206 253   Basic Metabolic Panel: Recent Labs  Lab 08/15/23 0257 08/16/23 0232 08/17/23 0306 08/18/23 0320 08/19/23 0306  NA 137 135 136 135 134*  K 4.0 3.6 3.4* 4.3 3.7  CL 97* 94* 92* 91* 90*  CO2 31 32 33* 35* 36*  GLUCOSE 107* 99 116* 100* 118*  BUN 23 21 22 22  26*  CREATININE 1.76* 1.71* 1.80* 2.00* 2.18*  CALCIUM  8.2* 8.1* 8.4* 8.4* 8.3*   GFR: Estimated Creatinine Clearance: 19.6 mL/min (A) (by C-G formula based on SCr of 2.18 mg/dL (H)). Liver Function Tests: Recent Labs  Lab 08/18/23 0320  AST 39  ALT 41  ALKPHOS 61  BILITOT 1.1  PROT 5.2*  ALBUMIN  2.6*   No results for input(s): LIPASE, AMYLASE in the last 168 hours. No results for input(s): AMMONIA in the last 168 hours. Coagulation Profile: No results for input(s): INR, PROTIME in the last 168 hours.  Cardiac Enzymes: No results for input(s): CKTOTAL, CKMB, CKMBINDEX, TROPONINI in the last 168 hours. BNP (last 3 results) No results for  input(s): PROBNP in the last 8760 hours. HbA1C: No results for input(s): HGBA1C in the last 72 hours. CBG: No results for input(s): GLUCAP in the last 168 hours. Lipid Profile: No results for input(s): CHOL, HDL, LDLCALC, TRIG, CHOLHDL, LDLDIRECT in the last 72 hours. Thyroid  Function Tests: No results for input(s): TSH, T4TOTAL, FREET4, T3FREE, THYROIDAB in the last 72 hours.  Anemia Panel: No results for input(s): VITAMINB12, FOLATE, FERRITIN, TIBC, IRON, RETICCTPCT in the last 72 hours.  Urine analysis:    Component Value Date/Time   COLORURINE YELLOW 08/09/2023 0742   APPEARANCEUR CLEAR 08/09/2023 0742   LABSPEC 1.009 08/09/2023 0742   PHURINE 5.0 08/09/2023 0742   GLUCOSEU NEGATIVE 08/09/2023 0742   HGBUR NEGATIVE 08/09/2023 0742   BILIRUBINUR NEGATIVE 08/09/2023 0742   BILIRUBINUR negative 06/04/2016 0847   BILIRUBINUR neg 02/14/2015 1431   KETONESUR NEGATIVE 08/09/2023 0742   PROTEINUR NEGATIVE 08/09/2023 0742   UROBILINOGEN 0.2 06/04/2016 0847   NITRITE NEGATIVE 08/09/2023 0742   LEUKOCYTESUR TRACE (A) 08/09/2023 0742   Sepsis Labs: @LABRCNTIP (procalcitonin:4,lacticidven:4)  ) Recent Results (from the past 240 hours)  Culture, body fluid w Gram Stain-bottle     Status: None   Collection Time: 08/14/23  1:08 PM   Specimen: Pleura  Result Value Ref Range Status   Specimen Description PLEURAL  Final   Special Requests NONE  Final   Culture   Final  NO GROWTH 5 DAYS Performed at [facility], [address], [place], [place] [address]    Report Status 08/19/2023 FINAL  Final  Gram stain     Status: None   Collection Time: 08/14/23  1:08 PM   Specimen: Pleura  Result Value Ref Range Status   Specimen Description PLEURAL  Final   Special Requests NONE  Final   Gram Stain   Final    WBC PRESENT, PREDOMINANTLY MONONUCLEAR NO ORGANISMS SEEN CYTOSPIN SMEAR Performed at [facility], [address],  [place], [place] [address]    Report Status 08/14/2023 FINAL  Final     Radiology Studies: No results found.     Scheduled Meds:  amiodarone   200 mg Oral Daily   aspirin  EC  81 mg Oral Daily   bisacodyl   10 mg Rectal Once   enoxaparin  (LOVENOX ) injection  30 mg Subcutaneous Q24H   imatinib   400 mg Oral Daily   lidocaine   1 patch Transdermal Q24H   metoprolol  succinate  12.5 mg Oral Daily   potassium chloride   40 mEq Oral Daily   thyroid   15 mg Oral QAC breakfast   timolol   1 drop Left Eye BID   torsemide   20 mg Oral BID   Continuous Infusions:  albumin  human 25 g (08/19/23 1002)     LOS: 11 days    Time spent:    Sigurd Pac, MD [facility]   08/19/2023, 10:50 AM

## 2023-08-19 NOTE — Progress Notes (Signed)
 Notified Dr Jomarie Longs about patient's low BP and need for new TELE order. New orders placed

## 2023-08-19 NOTE — Care Management Important Message (Signed)
 Important Message  Patient Details  Name: Lisa [name] MRN: [id number] Date of Birth: September 01, 1940   Important Message Given:  Yes - Medicare IM     Renie Ora 08/19/2023, 11:14 AM

## 2023-08-19 NOTE — Progress Notes (Signed)
 OT Cancellation Note  Patient Details Name: Lisa [name] MRN: [id number] DOB: 10-23-1940   Cancelled Treatment:    Reason Eval/Treat Not Completed: (P) Patient declined, no reason specified, Pt busy with personal matters, asked to return another time, will check back as able.   [name] 08/19/2023, 1:20 PM

## 2023-08-19 NOTE — Plan of Care (Signed)

## 2023-08-19 NOTE — Progress Notes (Addendum)
 Progress Note  Patient Name: Lisa [name] Date of Encounter: 08/19/2023  Primary Cardiologist: Vina Gull, MD  Subjective   She continues to have occasional nausea and vomiting. Thought her breathing was better today but then had some episodes of coughing. 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  Inpatient Medications    Scheduled Meds:  amiodarone   200 mg Oral Daily   aspirin  EC  81 mg Oral Daily   bisacodyl   10 mg Rectal Once   enoxaparin  (LOVENOX ) injection  30 mg Subcutaneous Q24H   imatinib   400 mg Oral Daily   lidocaine   1 patch Transdermal Q24H   metoprolol  succinate  12.5 mg Oral Daily   potassium chloride   40 mEq Oral Daily   thyroid   15 mg Oral QAC breakfast   timolol   1 drop Left Eye BID   torsemide   20 mg Oral BID   Continuous Infusions:  albumin  human 25 g (08/19/23 1002)   PRN Meds: acetaminophen  **OR** acetaminophen , melatonin, ondansetron  (ZOFRAN ) IV, mouth rinse, polyethylene glycol, prochlorperazine    Vital Signs    Vitals:   08/19/23 0517 08/19/23 0800 08/19/23 0821 08/19/23 1008  BP: (!) 141/54  (!) 100/33 (!) 100/33  Pulse: 73   73  Resp: 20 20 20    Temp: 98.4 F (36.9 C)  97.8 F (36.6 C)   TempSrc: Oral  Oral   SpO2: 96%  93%   Weight: 81.4 kg     Height:        Intake/Output Summary (Last 24 hours) at 08/19/2023 1103 Last data filed at 08/19/2023 0052 Gross per 24 hour  Intake 240 ml  Output 600 ml  Net -360 ml      08/19/2023    5:17 AM 08/18/2023    4:54 AM 08/17/2023    2:59 AM  Last 3 Weights  Weight (lbs) 179 lb 6.4 oz 178 lb 9.2 oz 180 lb 1.9 oz  Weight (kg) 81.375 kg 81 kg 81.7 kg     Telemetry    Atrial sensed v paced - Personally Reviewed  Physical Exam   GEN: No acute distress.  HEENT:  Normocephalic, sclera non-icteric. Neck: No JVD or bruits. Cardiac: RRR no murmurs, rubs, or gallops.  Respiratory: Decreased BS bilaterally L>R. Breathing is unlabored. GI: Soft, nontender, non-distended, BS +x 4. MS: no deformity. Extremities: No clubbing or cyanosis. No edema. Distal pedal pulses are 2+ and equal bilaterally. Neuro:  AAOx3. Follows commands. Psych:  Responds to questions appropriately with a normal affect.  Labs    High Sensitivity Troponin:   Recent Labs  Lab 08/08/23 1645 08/08/23 1927 08/09/23 0427  TROPONINIHS 39* 46* 53*      Cardiac EnzymesNo results for input(s): TROPONINI in the last 168 hours. No results for input(s): TROPIPOC in the last 168 hours.   Chemistry Recent Labs  Lab 08/17/23 0306 08/18/23 0320 08/19/23 0306  NA 136 135 134*  K 3.4* 4.3 3.7  CL 92* 91* 90*  CO2 33* 35* 36*  GLUCOSE 116* 100* 118*  BUN 22 22 26*  CREATININE 1.80* 2.00* 2.18*  CALCIUM  8.4* 8.4* 8.3*  PROT  --  5.2*  --   ALBUMIN   --  2.6*  --   AST  --  39  --   ALT  --  41  --   ALKPHOS  --  61  --   BILITOT  --  1.1  --   GFRNONAA 28* 25* 22*  ANIONGAP 11 9 8      Hematology Recent Labs  Lab 08/15/23 0257 08/16/23 0232 08/18/23 0320  WBC 5.2 4.9 5.8  RBC 2.50* 2.71* 2.73*  HGB 8.0* 8.5* 8.8*  HCT 24.5* 26.5* 26.7*  MCV 98.0 97.8 97.8  MCH 32.0 31.4 32.2  MCHC 32.7 32.1 33.0  RDW 16.9* 17.1* 17.0*  PLT 201 206 253    BNP Recent Labs  Lab 08/16/23 0232 08/17/23 0306  BNP 246.6* 234.2*     DDimer No results for input(s): DDIMER in the last 168 hours.   Radiology    No results found.  Cardiac Studies   2d echo 08/09/23  1. Left ventricular ejection fraction, by estimation, is 55 to 60%. The  left ventricle has normal function. The left ventricle has no regional  wall motion abnormalities. Left ventricular diastolic parameters are  consistent with Grade I diastolic  dysfunction (impaired relaxation).   2. Right ventricular  systolic function is normal. The right ventricular  size is normal.   3. Large pleural effusion.   4. The mitral valve is normal in structure. No evidence of mitral valve  regurgitation. No evidence of mitral stenosis.   5. The aortic valve is normal in structure. Aortic valve regurgitation is  not visualized. No aortic stenosis is present.   6. The inferior vena cava is dilated in size with <50% respiratory  variability, suggesting right atrial pressure of 15 mmHg.   Patient Profile     83 y.o. 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 Recently admitted earlier in 07/2023 with R leg cellulitis, bilateral lymphedemia, pseudomonas infection, a/c CHF, pleural effusion requiring thoracentesis. Readmitted with recurrent left sided pleural effusion, acute hypoxic respiratory failure, acute on chronic HFimpEF.  Assessment & Plan    Acute on Chronic HFimpEF - EF remains stable 50-55% this admission Recurrent left sided pleural effusion Acute hypoxic respiratory failure Hypoalbuminemia Hypotension -- presented with vomiting of the past 5 days along with diarrhea and mechanical fall -- CT of head, neck, spine showed large left sided pleural effusion, initial BNP elevated -- s/p L thoracentesis 12/29 and repeat on 1/4 though procedure had to be stopped in latter due to symptoms -- per notes, previously supposed to be on torsemide  20mg  daily but taking every other day at best -- UA negative for protein but hypoalbuminemia noted likely contributing to volume retention -- GDMT: limited in the setting of renal function/BP. MRA not pursued due to this. Felt high risk for UTI so no SGLT2i. Metolazone  discontinued. Now on torsemide  20mg  PO BID. Toprol  held due to hypotension. Will review plan with MD. May benefit from pulm f/u for  recurrent pleural effusions if this remains recurrent issue (moderate on repeat CXR 1/7), still present on exam. Also question whether this is driven by underlying cirrhosis? Obtain baseline ammonia with n/v.   Hx of VT s/p CRT-D -- followed by Dr. Waddell, reported device interrogation from 08/02/23 showed normal function   -- on amiodarone  200mg  daily, LFTS improving, will need to follow outpatient, TSH wnl   AKI on CKD stage 3b -- most recent baseline Cr around 1.6, fluctuating this admission, presently 2.18 - may need  to tolerate higher Cr for euvolemia -- follow closely with diuresis    Elevated LFTs, question of cirrhosis, hypoalbuminemia -- Question due to hepatic congestion or Gleevac, will review with MD whether presence of cirrhosis infers decision to continue amiodarone  -- Initial lactic acid 1.8 -- CT c/a/p notes subtle changes of cirrhosis -- hep panel negative -- will order baseline PT/INR and ammonia level in light of vomiting -- per IM  Elevated troponin Low/flat, suspected demand process   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 Okey.  For questions or updates, please contact [facility] Please consult [email or web address] for contact info under Cardiology/STEMI.  Signed, Raphael LOISE Bring, PA-C 08/19/2023, 11:03 AM    Personally seen and examined. Agree with APP above with the following comments: Patient feels well.  No nausea at time of evaluation. Her daughter is here and they are discussing at length what location she should be best served at discharge.  V paced rhythm.   Stable lymphedema. Presently getting albumin  for low BP. Given hypotension; will decrease back to torsemide  20 mg PO daily. Continue amiodarone - given her VT this would be reasonable despite cirrhosis hx Discussed  placement at length with patient and daughter. Has January 2025 follow up. Will sign off.  Stanly Leavens, MD [facility] Cardiologist [facility]  [address] [place], [address] [place], [place] [address] [phone]  2:19 PM

## 2023-08-19 NOTE — Progress Notes (Signed)
 Mobility Specialist Progress Note:    08/19/23 1210  Mobility  Activity Ambulated with assistance in hallway  Level of Assistance Contact guard assist, steadying assist  Assistive Device Front wheel walker  Distance Ambulated (ft) 6 ft  Activity Response Tolerated well  Mobility Referral Yes  Mobility visit 1 Mobility  Mobility Specialist Start Time (ACUTE ONLY) 1050  Mobility Specialist Stop Time (ACUTE ONLY) 1102  Mobility Specialist Time Calculation (min) (ACUTE ONLY) 12 min   Pt received in bed agreeable to mobility. No physical assistance needed throughout session. Was able to take a couple steps towards the chair w/o fault. Situated in chair w/ call bell and personal belongings in reach. All needs met.   Thersia Minder Mobility Specialist  Please contact vis Secure Chat or  Rehab Office [phone]

## 2023-08-19 NOTE — Evaluation (Addendum)
 Occupational Therapy Evaluation Patient Details Name: Lisa [name] MRN: [id number] DOB: 1941-01-19 Today's Date: 08/19/2023   History of Present Illness Pt is an 83 year old woman admitted 10/08/2022 with suspected acute on chronic diastolic heart failure after presenting from home complaining of shortness of breath. PMH includes: + for pseudomonas, CHF exacerbation, L pleural effusion. 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   Clinical Impression   Pt worried about returning home and completing 3 steps to get into her home, states she has no support upon return home. She is hoping she can arrange assistance to get in home and complete exercises herself to improve strength so she can get to OPPT, which is being arranged. Pt requested BSC and RW as she only has a rollator and feels she needs more support. I agree, RW for maintaining balance and safety is recommended. Pt has difficulty getting up/down off toilet at home, and Pt currently only able to ambulate short distances. Pt would benefit from [facility] to ensure safety with transfers. Pt educated on general UB/LB exercises for strength. Pt would benefit from continued skilled OT to maximize activity tolerance and safety with mobility. Updating recommendation to postacute rehab <3hrs/day due to no assistance at home and CLOF, if Pt declines HHOT is an option if Pt is able to complete stairs and progress activity tolerance.       If plan is discharge home, recommend the following: A little help with walking and/or transfers;A little help with bathing/dressing/bathroom;Assistance with cooking/housework    Functional Status Assessment  Patient has had a recent decline in their functional status and demonstrates the ability to make significant improvements in function in a reasonable and predictable amount of time.  Equipment Recommendations  BSC/3in1;Other (comment) (RW)    Recommendations for Other Services        Precautions / Restrictions Precautions Precautions: Fall Precaution Comments: watch O2, on chemo meds Restrictions Weight Bearing Restrictions Per Provider Order: No      Mobility Bed Mobility Overal bed mobility: Needs Assistance Bed Mobility: Sit to Supine     Supine to sit: Contact guard, Used rails, HOB elevated          Transfers Overall transfer level: Needs assistance Equipment used: Rolling walker (2 wheels), 1 person hand held assist Transfers: Sit to/from Stand, Bed to chair/wheelchair/BSC Sit to Stand: Contact guard assist     Step pivot transfers: Contact guard assist     General transfer comment: CGA      Balance Overall balance assessment: Mild deficits observed, not formally tested                                         ADL either performed or assessed with clinical judgement   ADL Overall ADL's : Needs assistance/impaired Eating/Feeding: Independent   Grooming: Contact guard assist;Standing   Upper Body Bathing: Contact guard assist;Sitting   Lower Body Bathing: Moderate assistance;Sitting/lateral leans   Upper Body Dressing : Set up;Supervision/safety;Sitting   Lower Body Dressing: Moderate assistance;Sitting/lateral leans   Toilet Transfer: Contact guard assist;Stand-pivot;Rolling walker (2 wheels)   Toileting- Clothing Manipulation and Hygiene: Set up;Sit to/from stand       Functional mobility during ADLs: Contact guard assist;Rolling walker (2 wheels) General ADL Comments: Pt able to complete bath with mod A for LB and help with back.     Vision  Perception         Praxis         Pertinent Vitals/Pain       Extremity/Trunk Assessment             Communication     Cognition Arousal: Alert Behavior During Therapy: WFL for tasks assessed/performed Overall Cognitive Status: Within Functional Limits for tasks assessed                                       General  Comments       Exercises Exercises: General Upper Extremity, General Lower Extremity   Shoulder Instructions      Home Living                                          Prior Functioning/Environment                          OT Problem List: Decreased activity tolerance;Impaired balance (sitting and/or standing);Decreased strength;Increased edema      OT Treatment/Interventions: Self-care/ADL training;Energy conservation;DME and/or AE instruction;Patient/family education;Balance training;Therapeutic activities    OT Goals(Current goals can be found in the care plan section) Acute Rehab OT Goals Patient Stated Goal: to improve strength OT Goal Formulation: With patient Time For Goal Achievement: 08/23/23 Potential to Achieve Goals: Good  OT Frequency: Min 1X/week    Co-evaluation              AM-PAC OT 6 Clicks Daily Activity     Outcome Measure Help from another person eating meals?: None Help from another person taking care of personal grooming?: A Little Help from another person toileting, which includes using toliet, bedpan, or urinal?: A Little Help from another person bathing (including washing, rinsing, drying)?: A Little Help from another person to put on and taking off regular upper body clothing?: A Little Help from another person to put on and taking off regular lower body clothing?: A Lot 6 Click Score: 18   End of Session Nurse Communication: Mobility status  Activity Tolerance: Patient tolerated treatment well Patient left: in bed;with call bell/phone within reach  OT Visit Diagnosis: Unsteadiness on feet (R26.81);Other abnormalities of gait and mobility (R26.89);Other (comment);Muscle weakness (generalized) (M62.81)                Time: 8379-8354 OT Time Calculation (min): 25 min Charges:  OT General Charges $OT Visit: 1 Visit OT Treatments $Self Care/Home Management : 8-22 mins $Therapeutic Exercise: 8-22 mins  Lisa [name], OTR/L   Lisa [name] 08/19/2023, 5:16 PM

## 2023-08-20 DIAGNOSIS — E66811 Obesity, class 1: Secondary | ICD-10-CM

## 2023-08-20 LAB — BASIC METABOLIC PANEL
Anion gap: 11 (ref 5–15)
BUN: 28 mg/dL — ABNORMAL HIGH (ref 8–23)
CO2: 35 mmol/L — ABNORMAL HIGH (ref 22–32)
Calcium: 8.8 mg/dL — ABNORMAL LOW (ref 8.9–10.3)
Chloride: 91 mmol/L — ABNORMAL LOW (ref 98–111)
Creatinine, Ser: 2.21 mg/dL — ABNORMAL HIGH (ref 0.44–1.00)
GFR, Estimated: 22 mL/min — ABNORMAL LOW (ref >60)
Glucose, Bld: 113 mg/dL — ABNORMAL HIGH (ref 70–99)
Potassium: 3.9 mmol/L (ref 3.5–5.1)
Sodium: 137 mmol/L (ref 135–145)

## 2023-08-20 LAB — CBC
HCT: 27.6 % — ABNORMAL LOW (ref 36.0–46.0)
Hemoglobin: 9 g/dL — ABNORMAL LOW (ref 12.0–15.0)
MCH: 31.7 pg (ref 26.0–34.0)
MCHC: 32.6 g/dL (ref 30.0–36.0)
MCV: 97.2 fL (ref 80.0–100.0)
Platelets: 286 10*3/uL (ref 150–400)
RBC: 2.84 MIL/uL — ABNORMAL LOW (ref 3.87–5.11)
RDW: 16.8 % — ABNORMAL HIGH (ref 11.5–15.5)
WBC: 6.3 10*3/uL (ref 4.0–10.5)
nRBC: 0 % (ref 0.0–0.2)

## 2023-08-20 LAB — MAGNESIUM: Magnesium: 1.9 mg/dL (ref 1.7–2.4)

## 2023-08-20 MED ORDER — MAGNESIUM SULFATE 2 GM/50ML IV SOLN
2.0000 g | Freq: Once | INTRAVENOUS | Status: AC
  Administered 2023-08-20: 2 g via INTRAVENOUS
  Filled 2023-08-20: qty 50

## 2023-08-20 NOTE — Care Management (Signed)
 1457 08-20-23 Case Manager received notification from the doctor that the patient will be staying overnight. Case Manager did call [facility] to cancel transportation for today. Patient will need ambulance transport for the weekend. No further needs identified at this time.

## 2023-08-20 NOTE — Assessment & Plan Note (Addendum)
 Continue with Gleevebc Cell count has been stable.  Acute on chronic anemia of chronic disease.

## 2023-08-20 NOTE — Assessment & Plan Note (Signed)
 Continue levothyroxine

## 2023-08-20 NOTE — Assessment & Plan Note (Addendum)
 Hypokalemia.   At the time of her discharge her renal function has a serum cr of 2,27 with K at 4,2 and serum bicarbonate at 33. Na 135 and Mg 2.2  Patient received IV albumin  during her hospitalization.   Plan to continue diuresis with torsemide  40 mg po daily, to resume on 1/13.  Follow up renal function and electrolytes as outpatient in 7 days. Patient will benefit from SGLT 2 inh and RAAS inhibition to start as outpatient when renal function more stable.

## 2023-08-20 NOTE — TOC Transition Note (Signed)
 Transition of Care [facility]) - Discharge Note   Patient Details  Name: Lisa [name] MRN: [id number] Date of Birth: 1941/04/17  Transition of [name]) CM/SW Contact:  Waddell Barnie Rama, RN Phone Number: 08/20/2023, 2:35 PM   Clinical Narrative:    Per MD patient is for dc today, NCM scheduled [facility] transport, packet on unit. NCM notified Cory with [employer]. [employer] will deliver bsc to home.   Final next level of care: Home/Self Care Barriers to Discharge: Continued Medical Work up   Patient Goals and CMS Choice Patient states their goals for this hospitalization and ongoing recovery are:: return home   Choice offered to / list presented to : NA      Discharge Placement                       Discharge Plan and Services Additional resources added to the After Visit Summary for   In-house Referral: NA Discharge Planning Services: CM Consult Post Acute Care Choice: NA          DME Arranged: N/A DME Agency: NA       HH Arranged: NA          Social Drivers of Health (SDOH) Interventions SDOH Screenings   Food Insecurity: No Food Insecurity (08/09/2023)  Housing: Low Risk  (08/09/2023)  Transportation Needs: No Transportation Needs (08/09/2023)  Utilities: Not At Risk (08/09/2023)  Financial Resource Strain: Low Risk  (10/28/2022)   Received from [facility], [facility]  Social Connections: Moderately Isolated (08/09/2023)  Tobacco Use: Low Risk  (08/08/2023)     Readmission Risk Interventions    08/10/2023    2:10 PM 07/19/2023    3:29 PM  Readmission Risk Prevention Plan  Transportation Screening Complete Complete  PCP or Specialist Appt within 5-7 Days  Complete  PCP or Specialist Appt within 3-5 Days Complete   Home Care Screening  Complete  Medication Review (RN CM)  Complete  HRI or Home Care Consult Complete   Palliative Care Screening Not Applicable   Medication Review (RN Care Manager) Complete

## 2023-08-20 NOTE — Hospital Course (Addendum)
 Lisa [name] was admitted to the hospital with the working diagnosis of decompensated heart failure.   82/F with CAD, chronic diastolic CHF, CML, chronic nausea and vomiting, chronic anemia, paroxysmal ventricular tachycardia, hypothyroidism, CKD 3B, recently hospitalized 12/7-12/11 with diastolic CHF complicated by left pleural effusion, diuresed and underwent left thoracentesis on 12/10, 900 mL of transudative fluid was drained. She reported 2 to 3 days of progressive dyspnea on exertion with cough orthopnea, and lower extremity edema. Positive generalized weakness, with one mechanical fall, with facial trauma.  Positive diarrhea, nausea and vomiting, EMS was called and she was found hypoxemic with 02 saturation 50's% on room air, she was placed on supplemental 02 per non re-breather and was transported to the ED.  On her initial ED physical examination her blood pressure was 188/82, HR 69, RR 20 and 02 saturation 100% on supplemental 02.  Lungs with decreased breath sounds bilaterally with no wheezing, heart with S1 and S2 present and regular, no gallops, abdomen with no distention and positive lower extremity edema.   VBG 7.23/ 60/ 42/ 25/ 67% Na 135, K 4.3 CL 101, bicarbonate 23, glucose 143, bun 22. Cr 2.0  AST 310 ALT 194  BNP 804  High sensitive troponin 39 and 46 Lactic acid 1,8  Wbc 8,6 hgb 9,8 plt 223   CT head, cervical spine and maxillofacial, with no acute intracranial abnormalities, no facial fracture, no acute fracture or static subluxation of the cervical spine.  A 1,8 cm parathyroid mass, due to overlap of the imaging features of benign and malignant parotid masses, histologic sampling is recommended.   Chest radiograph with right rotation, positive cardiomegaly, left pleural effusion, pacemaker defibrillator in place, with one right atrial lead, and biventricular leads.   CT chest, abdomen and pelvis with large left pleural effusion with subtotal collapse/ atelectasis of the  left lung. Moderate size right pleural effusion with atelectasis through the right lung.  Small volume simple ascites in the abdomen and pelvis. Mild generalized body wall edema.  Subtle capsular nodularity of the liver suspicious for cirrhosis.  Cholelithiasis without evidence of cholecystitis.   EKG 71 bpm, left axis deviation, left bundle branch block, qtc 561, atrial sensing and ventricular pacing rhythm, with no significant ST segment or T wave changes.   Underwent left thoracentesis by EDP 12/29, 1.1 mL of pleural fluid drained.   -Admitted, started on diuretics.  -Attempted repeat thoracentesis in IR 1/4, 750 mL drained, procedure had to be stopped on account of symptoms, perIR PA at least 2 L present -Lasix  and metolazone  continued -1/8: Switch to oral torsemide   01/11 improved volume status, patient will continue diuresis at home with torsemide  with close follow up as outpatient.

## 2023-08-20 NOTE — Plan of Care (Signed)

## 2023-08-20 NOTE — TOC Progression Note (Addendum)
 Transition of Care [facility]) - Progression Note    Patient Details  Name: Lisa [name] MRN: [id number] Date of Birth: Oct 19, 1940  Transition of Care [facility]) CM/SW Contact  Waddell Barnie Rama, RN Phone Number: 08/20/2023, 8:24 AM  Clinical Narrative:    NCM spoke with patient at bedside, she states she does not want to go to a SNF, she states she will do HH at home, for HHPT, HHOT. She states she has a caregiver 2 days out of the week and she can do what she needs for herself.  NCM offered choice, she states she has no preference.  NCM made referral to [facility] with [facility].   Darleene states he can take referral , soc will begin 24 to 48 hrs post dc.  Also she has no preference of DME agency for [facility], which needs to be delivered to her home.  She also will need safe transport set up for home.  Patient states she has a rollator just had it for two years, NCM informed her that her insurance will require her to have the rollator for more than 5 years before they can pay for another walker, the self pay price is around 84.00 per [employer] rep, but if get off [employer] it will run around 45.00, she states she will get the walker off [employer].  She would like the [facility] to be delivered to her home.  Per OT she will need ambulance transport home when she is dc , when they worked with her on the stairs she needed 2 people ast.  NCM informed patient of this information.    Expected Discharge Plan: Home/Self Care Barriers to Discharge: Continued Medical Work up  Expected Discharge Plan and Services In-house Referral: NA Discharge Planning Services: CM Consult Post Acute Care Choice: NA Living arrangements for the past 2 months: Single Family Home                 DME Arranged: N/A DME Agency: NA       HH Arranged: NA           Social Determinants of Health (SDOH) Interventions SDOH Screenings   Food Insecurity: No Food Insecurity (08/09/2023)  Housing: Low Risk  (08/09/2023)  Transportation Needs: No  Transportation Needs (08/09/2023)  Utilities: Not At Risk (08/09/2023)  Financial Resource Strain: Low Risk  (10/28/2022)   [facility] from [facility], [facility]  Social Connections: Moderately Isolated (08/09/2023)  Tobacco Use: Low Risk  (08/08/2023)    Readmission Risk Interventions    08/10/2023    2:10 PM 07/19/2023    3:29 PM  Readmission Risk Prevention Plan  Transportation Screening Complete Complete  PCP or Specialist Appt within 5-7 Days  Complete  PCP or Specialist Appt within 3-5 Days Complete   Home Care Screening  Complete  Medication Review (RN CM)  Complete  HRI or Home Care Consult Complete   Palliative Care Screening Not Applicable   Medication Review (RN Care Manager) Complete

## 2023-08-20 NOTE — Assessment & Plan Note (Addendum)
 Continue blood pressure monitoring.  Metoprolol was held due to hypotension, at home she was taking low dose of 12.5 mg daily.  Will continue to hold for now.

## 2023-08-20 NOTE — Progress Notes (Signed)
 Physical Therapy Treatment Patient Details Name: Lisa [name] MRN: [id number] DOB: [birth year] Today's Date: 08/20/2023   History of Present Illness Pt is an 83 year old woman admitted 10/08/2022 with suspected acute on chronic diastolic heart failure after presenting from home complaining of shortness of breath. PMH includes: + for pseudomonas, CHF exacerbation, L pleural effusion. 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    PT Comments  Patient resting in bed and agreeable to mobilize with therapy. Session focused on progression of ambulation. Pt demonstrating good carryover for safe transfer technique and CGA/supervision for sit<>stands with RW. Pt completed 4 bouts of ambulation with chair follow for safety to progress distance. Pt amb 90' max distance with VSS. At end of final bout pt developed nausea, dry heaves and coughed up mucous. VSS throughout. Once symptoms resolved pt attempted stair mobility and was able to place Rt foot up on single step but unable to ascend and bring Lt foot up to meet. PT returned to recliner and repositioned for comfort. Pt then verbalized need for toileting and CGA provided for SPT transfer to [facility]. Pt had successful BM and voided bladder. pT then co dizziness and warm sensation, +2 HHA for safety to move BSC>recliner and assist with pericare. Pt declining SNF rehab, will benefit from [facility] services and medical transport due to inability to negotiate stairs.   If plan is discharge home, recommend the following: A little help with walking and/or transfers;A little help with bathing/dressing/bathroom;Assistance with cooking/housework;Assist for transportation;Help with stairs or ramp for entrance   Can travel by private vehicle        Equipment Recommendations  None recommended by PT    Recommendations for Other Services       Precautions / Restrictions Precautions Precautions: Fall Precaution Comments: watch O2, on chemo  meds Restrictions Weight Bearing Restrictions Per Provider Order: No     Mobility  Bed Mobility Overal bed mobility: Needs Assistance Bed Mobility: Supine to Sit     Supine to sit: Contact guard, Used rails, HOB elevated     General bed mobility comments: CGA and extra time with use of bed features    Transfers Overall transfer level: Needs assistance Equipment used: Rolling walker (2 wheels), 1 person hand held assist Transfers: Sit to/from Stand, Bed to chair/wheelchair/BSC Sit to Stand: Contact guard assist   Step pivot transfers: Contact guard assist       General transfer comment: CGA from EOB, recliner, and BSC. EOS pt required +2 min assist for BSC>chair due to nausea and emesis episode and c/o dizziness and overheated sensation.    Ambulation/Gait Ambulation/Gait assistance: Contact guard assist Gait Distance (Feet): 90 Feet Assistive device: Rolling walker (2 wheels) (70, 90,80,80) Gait Pattern/deviations: Step-through pattern, Decreased stride length, Wide base of support Gait velocity: decr     General Gait Details: CGA for safety with RW, pt with wide BOS to stabilize balance. pace slow and cautious, no LOB noted.   Stairs Stairs: Yes Stairs assistance: Max assist Stair Management: Forwards, Step to pattern (Bil UE on one rail) Number of Stairs: 1 General stair comments: attempted single step up with bil UE support on rail. pt able to bring Rt foot onto step but un able to fully rise to ascend.   Wheelchair Mobility     Tilt Bed    Modified Rankin (Stroke Patients Only)       Balance Overall balance assessment: Mild deficits observed, not formally tested  Cognition Arousal: Alert Behavior During Therapy: WFL for tasks assessed/performed Overall Cognitive Status: Within Functional Limits for tasks assessed                                 General Comments: poor  awareness into safety and deficits        Exercises      General Comments        Pertinent Vitals/Pain Pain Assessment Pain Assessment: Faces Faces Pain Scale: Hurts a little bit Pain Location: Rt buttocks/hip Pain Descriptors / Indicators: Discomfort Pain Intervention(s): Limited activity within patient's tolerance, Monitored during session, Repositioned    Home Living                          Prior Function            PT Goals (current goals can now be found in the care plan section) Acute Rehab PT Goals Patient Stated Goal: get well go home PT Goal Formulation: With patient Time For Goal Achievement: 08/24/23 Potential to Achieve Goals: Good Progress towards PT goals: Progressing toward goals    Frequency    Min 1X/week      PT Plan      Co-evaluation              AM-PAC PT 6 Clicks Mobility   Outcome Measure  Help needed turning from your back to your side while in a flat bed without using bedrails?: None Help needed moving from lying on your back to sitting on the side of a flat bed without using bedrails?: A Little Help needed moving to and from a bed to a chair (including a wheelchair)?: A Little Help needed standing up from a chair using your arms (e.g., wheelchair or bedside chair)?: A Little Help needed to walk in hospital room?: A Little Help needed climbing 3-5 steps with a railing? : A Lot 6 Click Score: 18    End of Session Equipment Utilized During Treatment: Gait belt Activity Tolerance: Patient tolerated treatment well Patient left: in chair;with call bell/phone within reach;with nursing/sitter in room Nurse Communication: Mobility status PT Visit Diagnosis: Difficulty in walking, not elsewhere classified (R26.2);Muscle weakness (generalized) (M62.81);Unsteadiness on feet (R26.81)     Time: 1100-1201 PT Time Calculation (min) (ACUTE ONLY): 61 min  Charges:    $Gait Training: 23-37 mins $Therapeutic Activity:  23-37 mins PT General Charges $$ ACUTE PT VISIT: 1 Visit                     Vernell DONEEN KLEIN, DPT Acute Rehabilitation Services Office [phone]  08/20/23 4:14 PM

## 2023-08-20 NOTE — Assessment & Plan Note (Addendum)
 Echocardiogram with preserved LV systolic function with Ef 55 to 60%, RV systolic function preserved, no significant valvular disease.   Patient was placed on IV furosemide  and oral metolazone , negative fluid balance was achieved, - 7.961 with significant improvement in her symptoms,  Hypotension and low GFR limit further heart failure guideline medical therapy.   Plan to continue diuresis with torsemide  40 mg daily to start on 1/13.   Continue holding metoprolol  for now, to consider adding SGLT 2 inh and MRA as outpatient.   Acute Hypoxemic respiratory failure.  Cardiogenic pulmonary edema and right and left pleural effusion.  Patient had left thoracentesis x2 during this hospitalization.  Per Light's criteria exudate, due protein pleural fluid/ serum greater than 0.5 and pleural fluid LDH greater than 2/3 the upper limit of normal.  Pleural fluid cytology with no malignant cell. Benign reactive mesothelial cells and chronic inflammatory cells.   Elevated liver enzymes due to congestive hepatopathy.  CT with possible cirrhosis, follow up as outpatient.   History of ventricular tachycardia, continue with amiodarone .  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

## 2023-08-20 NOTE — Assessment & Plan Note (Addendum)
 Calculated BMI is 33.3  Right heel pressure injury stage 1 present on admission.  Dyslipidemia, continue with atorvastatin.

## 2023-08-20 NOTE — Progress Notes (Signed)
  Progress Note   Patient: Lisa [name] FMW:[id number] DOB: Jan 08, 1941 DOA: 08/08/2023     12 DOS: the patient was seen and examined on 08/20/2023   Brief hospital course: 82/F with CAD, chronic diastolic CHF, CML, chronic nausea and vomiting, chronic anemia, paroxysmal ventricular tachycardia, hypothyroidism, CKD 3B, recently hospitalized 12/7-12/11 with diastolic CHF complicated by left pleural effusion, diuresed and underwent left thoracentesis on 12/10, 900 mL of transudative fluid was drained.  -Back in the ED 12/29, reported 2 to 3 days of progressive dyspnea on exertion with cough orthopnea and nausea vomiting. -In the ED hypertensive, hypoxic, underwent left thoracentesis by EDP 12/29, 1.1 mL of pleural fluid drained, labs noted creatinine 2.0, sodium 135, mildly elevated LFTs, BNP 804, troponin 39, 46, WBC 8.6, hemoglobin 9.8.   CT chest abdomen pelvis demonstrated large left pleural effusion, moderate right pleural effusion, small ascites, liver nodularity concerning for cirrhosis -Admitted, started on diuretics, cards following -Attempted repeat thoracentesis in IR 1/4, 750 mL drained, procedure had to be stopped on account of symptoms, perIR PA at least 2 L present -Lasix  and metolazone  continued -1/8: Switch to oral torsemide   Assessment and Plan: * Acute on chronic diastolic heart failure (HCC) Echocardiogram with preserved LV systolic function with Ef 55 to 60%, RV systolic function preserved, no significant valvular disease.   Patient with improvement in volume status, with negative fluid balance, at -7461 ml  Plan to hold on pm dose of torsemide  today.  Patient had metolazone  during this admission.  Hypoxemic respiratory failure, acute.  Cardiogenic pulmonary edema with left pleural effusion.  Likely transudate.   Elevated liver enzymes due to congestive hepatopathy.   History of ventricular tachycardia, continue with amiodarone .   Essential hypertension Continue  blood pressure monitoring.   Acute renal failure superimposed on stage 3b chronic kidney disease (HCC) Hypokalemia.  Renal function with mild elevation in serum cr, will hold on pm dose of torsemide  and follow up renal function in am.    CML (chronic myelocytic leukemia) (HCC) Continue with Gleevebc Cell count has been stable.   Acquired hypothyroidism Continue levothyroxine.   Obesity, class 1 CT with cirrhosis, possible fatty liver disease. Continue supportive medical care.         Subjective: patient is feeling better, dyspnea and edema continue to improve, she declined SNF and would like to go home with home health services   Physical Exam: Vitals:   08/19/23 2356 08/20/23 0422 08/20/23 0857 08/20/23 1202  BP: (!) 117/45 (!) 109/43 (!) 105/41 (!) 122/53  Pulse: 73   80  Resp: 20 20 13 20   Temp: 98.4 F (36.9 C) 98.2 F (36.8 C)  98.2 F (36.8 C)  TempSrc: Oral Oral  Oral  SpO2: 92% 93%  95%  Weight:  80.1 kg    Height:       Neurology awake and alert ENT with mild pallor Cardiovascular with S1 and S2 present and regular with no gallops, rubs or murmurs No JVD Lower extremity edema with no pitting, possible lymphedema Respiratory with no rales or wheezing Abdomen with no distention  Data Reviewed:    Family Communication: no family at the bedside   Disposition: Status is: Inpatient Remains inpatient appropriate because: heart failure    Planned Discharge Destination: Home    Author: Elidia Toribio Furnace, MD 08/20/2023 3:56 PM  For on call review [email or web address].

## 2023-08-21 ENCOUNTER — Other Ambulatory Visit (HOSPITAL_COMMUNITY): Payer: Self-pay

## 2023-08-21 DIAGNOSIS — E66811 Obesity, class 1: Secondary | ICD-10-CM

## 2023-08-21 LAB — BASIC METABOLIC PANEL
Anion gap: 9 (ref 5–15)
BUN: 29 mg/dL — ABNORMAL HIGH (ref 8–23)
CO2: 33 mmol/L — ABNORMAL HIGH (ref 22–32)
Calcium: 8.3 mg/dL — ABNORMAL LOW (ref 8.9–10.3)
Chloride: 93 mmol/L — ABNORMAL LOW (ref 98–111)
Creatinine, Ser: 2.27 mg/dL — ABNORMAL HIGH (ref 0.44–1.00)
GFR, Estimated: 21 mL/min — ABNORMAL LOW (ref >60)
Glucose, Bld: 121 mg/dL — ABNORMAL HIGH (ref 70–99)
Potassium: 4.2 mmol/L (ref 3.5–5.1)
Sodium: 135 mmol/L (ref 135–145)

## 2023-08-21 LAB — MAGNESIUM: Magnesium: 2.2 mg/dL (ref 1.7–2.4)

## 2023-08-21 MED ORDER — ACETAMINOPHEN 325 MG PO TABS: 650.0000 mg | ORAL_TABLET | Freq: Four times a day (QID) | ORAL | Status: DC | PRN

## 2023-08-21 MED ORDER — NITROGLYCERIN 0.4 MG SL SUBL
0.4000 mg | SUBLINGUAL_TABLET | SUBLINGUAL | 0 refills | Status: DC | PRN
  Filled 2023-08-21: qty 25, 5d supply, fill #0

## 2023-08-21 MED ORDER — TORSEMIDE 20 MG PO TABS: 40.0000 mg | ORAL_TABLET | Freq: Every day | ORAL | Status: DC

## 2023-08-21 MED ORDER — TORSEMIDE 20 MG PO TABS
40.0000 mg | ORAL_TABLET | Freq: Every day | ORAL | 0 refills | Status: DC
  Filled 2023-08-21: qty 30, 15d supply, fill #0

## 2023-08-21 NOTE — TOC Transition Note (Signed)
 Transition of Care [name]) - Discharge Note   Patient Details  Name: Lisa [name] MRN: [id number] Date of Birth: 01-07-1941  Transition [facility]) CM/SW Contact:  Marval Gell, RN Phone Number: 08/21/2023, 10:18 AM   Clinical Narrative:     [facility] that patient will DC today.  Spoke w patient and verified address.  Meds to be filled by TOC.  Scheduled PTAR pickup for noon to allow time for meds to be filled. Bedside nurse notified via secure chat   Final next level of care: Home w Home Health Services Barriers to Discharge: No Barriers Identified   Patient Goals and CMS Choice Patient states their goals for this hospitalization and ongoing recovery are:: return home   Choice offered to / list presented to : NA      Discharge Placement                       Discharge Plan and Services Additional resources added to the After Visit Summary for   In-house Referral: NA Discharge Planning Services: CM Consult Post Acute Care Choice: NA          DME Arranged: N/A DME Agency: NA       HH Arranged: NA HH Agency: [facility] Date [facility] Agency Contacted: 08/21/23 Time HH Agency Contacted: 1018 Representative spoke with at [facility] Agency: Darleene  Social Drivers of Health (SDOH) Interventions SDOH Screenings   Food Insecurity: No Food Insecurity (08/09/2023)  Housing: Low Risk  (08/09/2023)  Transportation Needs: No Transportation Needs (08/09/2023)  Utilities: Not At Risk (08/09/2023)  Financial Resource Strain: Low Risk  (10/28/2022)   Received from [facility]  Social Connections: Moderately Isolated (08/09/2023)  Tobacco Use: Low Risk  (08/08/2023)     Readmission Risk Interventions    08/10/2023    2:10 PM 07/19/2023    3:29 PM  Readmission Risk Prevention Plan  Transportation Screening Complete Complete  PCP or Specialist Appt within 5-7 Days  Complete  PCP or Specialist Appt within 3-5 Days Complete   Home Care  Screening  Complete  Medication Review (RN CM)  Complete  HRI or Home Care Consult Complete   Palliative Care Screening Not Applicable   Medication Review (RN Care Manager) Complete

## 2023-08-21 NOTE — Discharge Summary (Addendum)
 Physician Discharge Summary   Patient: Lisa [name] MRN: [id number] DOB: 05/25/1941  Admit date:     08/08/2023  Discharge date: 08/21/23  Discharge Physician: Elidia Sieving Ishia Tenorio   PCP: Claudene Thersia Reusing, DO   Recommendations at discharge:    Torsemide  has been increased to 40 mg po daily Holding metoprolol  due to risk of hypotension.  Guideline directed medical therapy for heart failure is limited due to hypotension and low GFR. Possible addition of SGLT 2 inh and mineralocorticoid receptor antagonist as outpatient if renal function more stable.  Holding on K supplements until follow up renal function as outpatient.  Follow up renal function and electrolytes in 7 days.  Follow up with Dr Claudene in 7 to 10 days.  Follow up with Cardiology as scheduled.  Follow up parathyroid mass.  Patient declined SNF.   I spoke with patient's daughter over the phone we talked in detail about patient's condition, plan of care and prognosis and all questions were addressed.  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 [facility] Course: Lisa [name] was admitted to the hospital with the working diagnosis of decompensated heart failure.   83/F with CAD, chronic diastolic CHF, CML, chronic nausea and vomiting, chronic anemia, paroxysmal ventricular tachycardia, hypothyroidism, CKD 3B, recently hospitalized 12/7-12/11 with diastolic CHF complicated by left pleural effusion, diuresed and underwent left thoracentesis on 12/10, 900 mL of transudative fluid was drained. She reported 2 to 3 days of progressive dyspnea on exertion with cough orthopnea, and lower extremity edema. Positive  generalized weakness, with one mechanical fall, with facial trauma.  Positive diarrhea, nausea and vomiting, EMS was called and she was found hypoxemic with 02 saturation 50's% on room air, she was placed on supplemental 02 per non re-breather and was transported to the ED.  On her initial ED physical examination her blood pressure was 188/82, HR 69, RR 20 and 02 saturation 100% on supplemental 02.  Lungs with decreased breath sounds bilaterally with no wheezing, heart with S1 and S2 present and regular, no gallops, abdomen with no distention and positive lower extremity edema.   VBG 7.23/ 60/ 42/ 25/ 67% Na 135, K 4.3 CL 101, bicarbonate 23, glucose 143, bun 22. Cr 2.0  AST 310 ALT 194  BNP 804  High sensitive troponin 39 and 46 Lactic acid 1,8  Wbc 8,6 hgb 9,8 plt 223   CT head, cervical spine and maxillofacial, with no acute intracranial abnormalities, no facial fracture, no acute fracture or static subluxation of the cervical spine.  A 1,8 cm parathyroid mass, due to overlap of the imaging features of benign and malignant parotid masses, histologic sampling is recommended.   Chest radiograph with right rotation, positive cardiomegaly, left pleural effusion, pacemaker defibrillator in place, with one right atrial lead, and biventricular leads.   CT chest, abdomen and pelvis with large left pleural effusion with subtotal collapse/ atelectasis of the left lung. Moderate size right pleural effusion with atelectasis through the right lung.  Small volume simple ascites in the abdomen and pelvis. Mild generalized body wall edema.  Subtle capsular nodularity of the liver suspicious for cirrhosis.  Cholelithiasis without evidence of cholecystitis.   EKG 71 bpm, left  axis deviation, left bundle branch block, qtc 561, atrial sensing and ventricular pacing rhythm, with no significant ST segment or T wave changes.   Underwent left thoracentesis by EDP 12/29, 1.1 mL of pleural fluid drained.    -Admitted, started on diuretics.  -Attempted repeat thoracentesis in IR 1/4, 750 mL drained, procedure had to be stopped on account of symptoms, perIR PA at least 2 L present -Lasix  and metolazone  continued -1/8: Switch to oral torsemide   01/11 improved volume status, patient will continue diuresis at home with torsemide  with close follow up as outpatient.   Assessment and Plan: * Acute on chronic diastolic heart failure (HCC) Echocardiogram with preserved LV systolic function with Ef 55 to 60%, RV systolic function preserved, no significant valvular disease.   Patient was placed on IV furosemide  and oral metolazone , negative fluid balance was achieved, - 7.961 with significant improvement in her symptoms,  Hypotension and low GFR limit further heart failure guideline medical therapy.   Plan to continue diuresis with torsemide  40 mg daily to start on 1/13.   Continue holding metoprolol  for now, to consider adding SGLT 2 inh and MRA as outpatient.   Acute Hypoxemic respiratory failure.  Cardiogenic pulmonary edema and right and left pleural effusion.  Patient had left thoracentesis x2 during this hospitalization.  Per Light's criteria exudate, due protein pleural fluid/ serum greater than 0.5 and pleural fluid LDH greater than 2/3 the upper limit of normal.  Pleural fluid cytology with no malignant cell. Benign reactive mesothelial cells and chronic inflammatory cells.   Elevated liver enzymes due to congestive hepatopathy.  CT with possible cirrhosis, follow up as outpatient.   History of ventricular tachycardia, continue with amiodarone .  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   Essential hypertension Continue blood pressure monitoring.  Metoprolol  was held due to hypotension, at home she was taking low dose of 12.5 mg daily.  Will continue to hold for now.   Acute renal failure superimposed on stage 3b chronic kidney disease (HCC) Hypokalemia.   At the time of her discharge  her renal function has a serum cr of 2,27 with K at 4,2 and serum bicarbonate at 33. Na 135 and Mg 2.2  Patient received IV albumin  during her hospitalization.   Plan to continue diuresis with torsemide  40 mg po daily, to resume on 1/13.  Follow up renal function and electrolytes as outpatient in 7 days. Patient will benefit from SGLT 2 inh and RAAS inhibition to start as outpatient when renal function more stable.    CML (chronic myelocytic leukemia) (HCC) Continue with Gleevebc Cell count has been stable.  Acute on chronic anemia of chronic disease.  Acquired hypothyroidism Continue levothyroxine.   Obesity, class 1 Calculated BMI is 33.3  Right heel pressure injury stage 1 present on admission.  Dyslipidemia, continue with atorvastatin .    Consultants: cardiology IR  Procedures performed: left thoracentesis   Disposition: Home Diet recommendation:  Discharge Diet Orders (From admission, onward)     Start     Ordered   08/21/23 0000  Diet - low sodium heart healthy        08/21/23 0959           Cardiac diet DISCHARGE MEDICATION: Allergies as of 08/21/2023       Reactions   Flagyl [metronidazole] Itching, Rash, Other (See Comments)   Welts, also   Penicillins Hives, Itching, Rash   Has patient had a PCN reaction causing immediate rash, facial/tongue/throat swelling, SOB or lightheadedness with hypotension:  Yes  Has patient had a PCN reaction causing severe rash involving mucus membranes or skin necrosis: Yes Has patient had a PCN reaction that required hospitalization No Has patient had a PCN reaction occurring within the last 10 years: NO If all of the above answers are NO, then may proceed with Cephalosporin use.   Sulfamethoxazole Rash   Meperidine Nausea And Vomiting   Morphine  And Codeine  Nausea And Vomiting   Propoxyphene    Other reaction(s): Unknown   Tape Other (See Comments)   THE PLASTIC, CLEAR TAPE CAN/DOES PULL OFF MY SKIN.   Doxycycline   Nausea Only, Rash, Other (See Comments)   Made her feel terrible   Lanolin Itching, Rash   Sulfa Antibiotics Rash        Medication List     STOP taking these medications    metoprolol  succinate 25 MG 24 hr tablet Commonly known as: TOPROL -XL   potassium chloride  10 MEQ tablet Commonly known as: KLOR-CON        TAKE these medications    acetaminophen  325 MG tablet Commonly known as: TYLENOL  Take 2 tablets (650 mg total) by mouth every 6 (six) hours as needed for mild pain (pain score 1-3) or moderate pain (pain score 4-6). What changed:  medication strength how much to take reasons to take this   allopurinol  100 MG tablet Commonly known as: ZYLOPRIM  Take 100 mg by mouth daily.   aluminum hydroxide-magnesium  carbonate 95-358 MG/15ML Susp Commonly known as: GAVISCON Take 30 mLs by mouth as needed for indigestion or heartburn.   amiodarone  200 MG tablet Commonly known as: PACERONE  Take 1 tablet (200 mg total) by mouth daily. What changed: when to take this   ascorbic acid  500 MG tablet Commonly known as: VITAMIN C  Take 500 mg by mouth daily.   aspirin  EC 81 MG tablet Take 81 mg by mouth in the morning.   atorvastatin  20 MG tablet Commonly known as: LIPITOR Take 1 tablet (20 mg total) by mouth daily.   b complex vitamins capsule Take 1 capsule by mouth daily.   calcium  carbonate 1250 (500 Ca) MG tablet Commonly known as: OS-CAL - dosed in mg of elemental calcium  Take 1 tablet by mouth daily as needed (Calcium  levels).   imatinib  400 MG tablet Commonly known as: GLEEVEC  Take 400 mg by mouth at bedtime.   loperamide  2 MG capsule Commonly known as: IMODIUM  Take 2 mg by mouth as needed for diarrhea or loose stools.   MAGNESIUM  GLYCINATE PO Take 1 tablet by mouth at bedtime.   multivitamin with minerals Tabs tablet Take 1 tablet by mouth daily.   nitroGLYCERIN  0.4 MG SL tablet Commonly known as: NITROSTAT  Place 1 tablet (0.4 mg total) under the  tongue every 5 (five) minutes x 3 doses as needed for chest pain.   thyroid  15 MG tablet Commonly known as: ARMOUR Take 15 mg by mouth daily before breakfast.   timolol  0.5 % ophthalmic solution Commonly known as: TIMOPTIC  Place 1 drop into the left eye 2 (two) times daily.   torsemide  20 MG tablet Commonly known as: DEMADEX  Take 2 tablets (40 mg total) by mouth daily. Start taking on: August 23, 2023 What changed:  how much to take when to take this These instructions start on August 23, 2023. If you are unsure what to do until then, ask your doctor or other care provider.   VITAMIN B12 SL Place 1 tablet under the tongue daily.   VITAMIN D -3 PO Take 5,000 Units  by mouth daily.               Durable Medical Equipment  (From admission, onward)           Start     Ordered   08/20/23 0816  For home use only DME Bedside commode  Once       Question:  Patient needs a bedside commode to treat with the following condition  Answer:  Weakness   08/20/23 0816            Follow-up Information     [facility] Follow up.   Specialty: Rehabilitation Why: they will call you to set up apt times, if you do not hear from them within 3 business days please call them, Contact information: [id number] [place]  [address] [phone]        [facility] - A Dept Of [facility]. Go on 09/01/2023.   Specialty: Internal Medicine Why: @2 :40pm Contact information: [address] [place]  [address] [phone] Additional information: [address] [place], [place] [address]        [facility] Follow up.   Why: Bedside commode Contact information: [phone]        [facility] Follow up.   Specialty: Home Health Services Why: Agency will call you to set up apt times Contact information: [address] [place] [address] [phone]                Discharge Exam: Filed Weights   08/19/23 0517 08/20/23 0422 08/21/23 0459  Weight: 81.4 kg 80.1 kg 80.1 kg   BP (!) 129/34 (BP Location: Right Arm)   Pulse 73   Temp 98.2 F (36.8 C) (Oral)   Resp 20   Ht 5' 1 (1.549 m)   Wt 80.1 kg   LMP 02/14/1995   SpO2 94%   BMI 33.37 kg/m   Patient is feeling better, no chest pain, dyspnea has improved along with lower extremity edema. No further diarrhea, no PND or orthopnea.   Neurology awake and alert ENT with mild pallor Cardiovascular with S1 and S2 present and regular with no gallops, rubs or murmurs No JVD No ankle edema, positive lymphedema bilateral lower extremities.  Respiratory with no rales or wheezing, no rhonchi Abdomen with no distention, protuberant.   Condition at discharge: stable  The results of significant diagnostics from this hospitalization (including imaging, microbiology, ancillary and laboratory) are listed below for reference.   Imaging Studies: DG Chest 2 View Result Date: 08/17/2023 CLINICAL DATA:  Shortness of breath and CHF. EXAM: CHEST - 2 VIEW COMPARISON:  Chest radiograph dated 08/14/2023. FINDINGS: Similar size moderate left pleural effusion and compressive atelectasis of the left lower lobe. The right lung is clear. No pneumothorax. Stable cardiomediastinal silhouette. Atherosclerotic calcification of the aorta. Left pectoral AICD device. No acute osseous pathology. IMPRESSION: Similar size moderate left pleural effusion and compressive atelectasis of the left lower lobe. Electronically Signed   By: Vanetta Chou M.D.   On: 08/17/2023 14:13   US  THORACENTESIS ASP PLEURAL SPACE W/IMG GUIDE Result Date: 08/14/2023 INDICATION: 83 year old female with recurrent left pleural effusion. Request for left thoracentesis. EXAM: ULTRASOUND GUIDED LEFT THORACENTESIS MEDICATIONS: 10 mL 1% lidocaine . COMPLICATIONS: None immediate. PROCEDURE: An ultrasound  guided thoracentesis was thoroughly discussed with the patient and questions answered. The benefits, risks,  alternatives and complications were also discussed. The patient understands and wishes to proceed with the procedure. Written consent was obtained. Ultrasound was performed to localize and mark an adequate pocket of fluid in the left chest. The area was then prepped and draped in the normal sterile fashion. 1% Lidocaine  was used for local anesthesia. Under ultrasound guidance a 6 Fr Safe-T-Centesis catheter was introduced. Thoracentesis was performed. The catheter was removed and a dressing applied. FINDINGS: A total of approximately 700 mL of clear, yellow fluid was removed. Samples were sent to the laboratory as requested by the clinical team. IMPRESSION: Successful ultrasound guided left thoracentesis yielding 700 mL of pleural fluid. Performed by: Kacie Matthews PA-C Electronically Signed   By: Ester Sides M.D.   On: 08/14/2023 16:12   DG Chest 1 View Result Date: 08/14/2023 CLINICAL DATA:  Status post left thoracentesis. EXAM: CHEST  1 VIEW COMPARISON:  X-ray preprocedure 08/13/2023. FINDINGS: Slight decreasing left pleural effusion with significant residual. No pneumothorax. Right lung is grossly clear. Stable cardiopericardial silhouette with calcified aorta. Left upper chest defibrillator. Overlapping cardiac leads. Film is under penetrated. IMPRESSION: Decreasing left pleural effusion post thoracentesis with significant residual. No pneumothorax. Electronically Signed   By: Ranell Bring M.D.   On: 08/14/2023 13:32   DG CHEST PORT 1 VIEW Result Date: 08/13/2023 CLINICAL DATA:  Pleural effusion. EXAM: PORTABLE CHEST 1 VIEW COMPARISON:  Chest x-ray dated August 11, 2023. FINDINGS: Unchanged left chest wall pacemaker cardiomegaly. Similar pulmonary vascular congestion with moderate to large left and small right pleural effusions. Unchanged lingular and lobe atelectasis. No pneumothorax. No acute  osseous abnormality. IMPRESSION: 1. Similar congestive heart failure with moderate to large left and small right pleural effusions. Electronically Signed   By: Elsie ONEIDA Shoulder M.D.   On: 08/13/2023 10:07   DG Chest 2 View Result Date: 08/11/2023 CLINICAL DATA:  Hypoxia. EXAM: CHEST - 2 VIEW COMPARISON:  08/08/2023 FINDINGS: Patient is rotated to the right. Lungs are adequately inflated demonstrate interval worsening of a moderate to large left effusion and stable small right effusion. Likely associated atelectasis in the lung bases. No pneumothorax. Left-sided pacemaker unchanged. Remainder of the exam is unchanged. IMPRESSION: Interval worsening of a moderate to large left effusion and stable small right effusion. Likely associated atelectasis in the lung bases. Electronically Signed   By: Toribio Agreste M.D.   On: 08/11/2023 14:28   ECHOCARDIOGRAM COMPLETE Result Date: 08/09/2023    ECHOCARDIOGRAM REPORT   Patient Name:   GENEA RHEAUME Date of Exam: 08/09/2023 Medical Rec #:  [id number]          Height:       62.0 in Accession #:    [id number]         Weight:       206.2 lb Date of Birth:  1941/04/08           BSA:          1.937 m Patient Age:    82 years           BP:           124/48 mmHg Patient Gender: F                  HR:           67 bpm. Exam Location:  Inpatient Procedure: 2D Echo, Cardiac Doppler and Color Doppler Indications:    CHF  History:        Patient has prior history  of Echocardiogram examinations, most                 recent 07/18/2023. CHF, Signs/Symptoms:Chest Pain; Risk                 Factors:Hypertension.  Sonographer:    Lanell Maduro Referring Phys: [id number] JUSTIN B HOWERTER IMPRESSIONS  1. Left ventricular ejection fraction, by estimation, is 55 to 60%. The left ventricle has normal function. The left ventricle has no regional wall motion abnormalities. Left ventricular diastolic parameters are consistent with Grade I diastolic dysfunction (impaired relaxation).  2. Right  ventricular systolic function is normal. The right ventricular size is normal.  3. Large pleural effusion.  4. The mitral valve is normal in structure. No evidence of mitral valve regurgitation. No evidence of mitral stenosis.  5. The aortic valve is normal in structure. Aortic valve regurgitation is not visualized. No aortic stenosis is present.  6. The inferior vena cava is dilated in size with <50% respiratory variability, suggesting right atrial pressure of 15 mmHg. FINDINGS  Left Ventricle: Left ventricular ejection fraction, by estimation, is 55 to 60%. The left ventricle has normal function. The left ventricle has no regional wall motion abnormalities. The left ventricular internal cavity size was normal in size. There is  no left ventricular hypertrophy. Left ventricular diastolic parameters are consistent with Grade I diastolic dysfunction (impaired relaxation). Indeterminate filling pressures. Right Ventricle: The right ventricular size is normal. No increase in right ventricular wall thickness. Right ventricular systolic function is normal. Left Atrium: Left atrial size was normal in size. Right Atrium: Right atrial size was normal in size. Pericardium: There is no evidence of pericardial effusion. Mitral Valve: The mitral valve is normal in structure. No evidence of mitral valve regurgitation. No evidence of mitral valve stenosis. Tricuspid Valve: The tricuspid valve is normal in structure. Tricuspid valve regurgitation is not demonstrated. No evidence of tricuspid stenosis. Aortic Valve: The aortic valve is normal in structure. Aortic valve regurgitation is not visualized. No aortic stenosis is present. Pulmonic Valve: The pulmonic valve was normal in structure. Pulmonic valve regurgitation is not visualized. No evidence of pulmonic stenosis. Aorta: The aortic root is normal in size and structure. Venous: The inferior vena cava is dilated in size with less than 50% respiratory variability, suggesting  right atrial pressure of 15 mmHg. IAS/Shunts: No atrial level shunt detected by color flow Doppler. Additional Comments: There is a large pleural effusion.  LEFT VENTRICLE PLAX 2D LVIDd:         4.80 cm      Diastology LVIDs:         4.30 cm      LV e' medial:    6.85 cm/s LV PW:         0.70 cm      LV E/e' medial:  10.8 LV IVS:        0.60 cm      LV e' lateral:   6.31 cm/s LVOT diam:     1.50 cm      LV E/e' lateral: 11.7 LV SV:         53 LV SV Index:   27 LVOT Area:     1.77 cm  LV Volumes (MOD) LV vol d, MOD A2C: 129.0 ml LV vol d, MOD A4C: 81.3 ml LV vol s, MOD A2C: 46.3 ml LV vol s, MOD A4C: 36.1 ml LV SV MOD A2C:     82.7 ml LV SV MOD A4C:  81.3 ml LV SV MOD BP:      63.6 ml RIGHT VENTRICLE             IVC RV Basal diam:  2.40 cm     IVC diam: 2.30 cm RV S prime:     13.80 cm/s TAPSE (M-mode): 3.9 cm LEFT ATRIUM             Index        RIGHT ATRIUM           Index LA diam:        4.60 cm 2.38 cm/m   RA Area:     12.90 cm LA Vol (A2C):   56.0 ml 28.92 ml/m  RA Volume:   23.40 ml  12.08 ml/m LA Vol (A4C):   50.8 ml 26.23 ml/m LA Biplane Vol: 56.1 ml 28.97 ml/m  AORTIC VALVE LVOT Vmax:   138.00 cm/s LVOT Vmean:  95.000 cm/s LVOT VTI:    0.299 m  AORTA Ao Asc diam: 3.40 cm MITRAL VALVE MV Area (PHT): 3.86 cm    SHUNTS MV E velocity: 73.70 cm/s  Systemic VTI:  0.30 m MV A velocity: 86.60 cm/s  Systemic Diam: 1.50 cm MV E/A ratio:  0.85 Annabella Scarce MD Electronically signed by Annabella Scarce MD Signature Date/Time: 08/09/2023/3:33:30 PM    Final    DG Chest Portable 1 View Result Date: 08/08/2023 CLINICAL DATA:  Shortness of breath.  Status post thoracentesis. EXAM: PORTABLE CHEST 1 VIEW COMPARISON:  Chest radiograph dated 08/08/2023 at 5:34 p.m. FINDINGS: Lines/tubes: Left chest wall ICD leads project over the right atrium and ventricle and tributary of the coronary sinus. Lungs: Low lung volumes. Bilateral interstitial opacities. Bibasilar patchy opacities. Pleura: Small right and  decreased small left pleural effusions. No pneumothorax. Heart/mediastinum: Similar enlarged cardiomediastinal silhouette. Bones: No acute osseous abnormality. IMPRESSION: 1. Small right and decreased small left pleural effusions. No pneumothorax. 2. Bilateral interstitial opacities and bibasilar patchy opacities, likely pulmonary edema. Electronically Signed   By: Limin  Xu M.D.   On: 08/08/2023 21:12   CT CHEST ABDOMEN PELVIS WO CONTRAST Result Date: 08/08/2023 CLINICAL DATA:  Sepsis.  Recent fall. EXAM: CT CHEST, ABDOMEN AND PELVIS WITHOUT CONTRAST TECHNIQUE: Multidetector CT imaging of the chest, abdomen and pelvis was performed following the standard protocol without IV contrast. RADIATION DOSE REDUCTION: This exam was performed according to the departmental dose-optimization program which includes automated exposure control, adjustment of the mA and/or kV according to patient size and/or use of iterative reconstruction technique. COMPARISON:  Radiograph earlier today. 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 Aortic atherosclerosis without aneurysm. The heart is mildly enlarged. No pericardial effusion. Mediastinum/Nodes: Lack of contrast limits hilar assessment. No gross mediastinal adenopathy. No mediastinal hemorrhage. Decompressed esophagus, no pneumomediastinum. Subcentimeter thyroid  nodules. Not clinically significant; no follow-up imaging recommended (ref: J Am Coll Radiol. 2015 Feb;12(2): 143-50). Lungs/Pleura: Large left and moderate right pleural effusion, simple fluid density. Complete collapse/compressive atelectasis of the left lower lobe with subtotal atelectasis of the anterior left upper lobe. Only small portion of left upper lobe is aerated in the hemithorax. No evidence of endobronchial or tracheal debris or obstructing lesion. Compressive atelectasis in the dependent right lung. Perifissural atelectasis in the right  upper and middle lobes. No evidence of pulmonary mass. Musculoskeletal: There are no acute or suspicious osseous abnormalities. Age related degenerative change in the spine. Bilateral breast implants which are partially calcified. CT ABDOMEN PELVIS FINDINGS Hepatobiliary:  Subtle capsular nodularity of the liver suspicious for cirrhosis. 8 mm hypodensity in the subcapsular right lobe of the liver is unchanged from prior exam and likely cyst. Layering sludge or stones in the gallbladder. No pericholecystic inflammation. No biliary dilatation. No evidence of hepatic injury. Pancreas: Unremarkable unenhanced appearance. Spleen: Unremarkable unenhanced appearance. 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 No further follow-up imaging is recommended. Normal right adrenal gland. No evidence of adrenal hemorrhage or injury. Bilateral renal parenchymal thinning without hydronephrosis or focal renal abnormality on this unenhanced exam. Stomach/Bowel: Bowel assessment is limited in the absence of contrast, arms down positioning and motion. The stomach is decompressed. No evidence of bowel obstruction or inflammation. Small volume of stool in the colon. Vascular/Lymphatic: Advanced aortic and branch atherosclerosis. No aortic aneurysm. No retroperitoneal fluid or perivascular haziness to suggest injury. Scattered small retroperitoneal and periportal nodes are typically reactive. No suspicious adenopathy. Reproductive: Status post hysterectomy. No adnexal masses. Other: Small volume simple ascites in the abdomen and pelvis. No evidence of hemorrhage. No free air. Mild generalized body wall edema. Musculoskeletal: There are no acute or suspicious osseous abnormalities. Moderate degenerative change in the lumbar spine. IMPRESSION: 1. Large left pleural effusion with subtotal collapse/atelectasis of the left lung. Only small portion of the left upper lobe is aerated. Moderate-sized right pleural  effusion with atelectasis throughout the right lung. 2. Small volume simple ascites in the abdomen and pelvis. Mild generalized body wall edema. 3. Subtle capsular nodularity of the liver suspicious for cirrhosis. 4. Cholelithiasis without evidence of cholecystitis. Aortic Atherosclerosis (ICD10-I70.0). Electronically Signed   By: Andrea Gasman M.D.   On: 08/08/2023 19:24   CT HEAD WO CONTRAST ( ) Result Date: 08/08/2023 CLINICAL DATA:  Trauma EXAM: CT HEAD WITHOUT CONTRAST CT MAXILLOFACIAL WITHOUT CONTRAST CT CERVICAL SPINE WITHOUT CONTRAST TECHNIQUE: Multidetector CT imaging of the head, cervical spine, and maxillofacial structures were performed using the standard protocol without intravenous contrast. Multiplanar CT image reconstructions of the cervical spine and maxillofacial structures were also generated. RADIATION DOSE REDUCTION: This exam was performed according to the departmental dose-optimization program which includes automated exposure control, adjustment of the mA and/or kV according to patient size and/or use of iterative reconstruction technique. COMPARISON:  05/01/2020 FINDINGS: CT HEAD FINDINGS Brain: There is no mass, hemorrhage or extra-axial collection. The size and configuration of the ventricles and extra-axial CSF spaces are normal. There is hypoattenuation of the white matter, most commonly indicating chronic small vessel disease. Vascular: Atherosclerotic calcification of the internal carotid arteries at the skull base. No abnormal hyperdensity of the major intracranial arteries or dural venous sinuses. Skull: The visualized skull base, calvarium and extracranial soft tissues are normal. Sinuses/Orbits: No fluid levels or advanced mucosal thickening of the visualized paranasal sinuses. No mastoid or middle ear effusion. Normal orbits. Other: None. CT MAXILLOFACIAL FINDINGS Osseous: No facial fracture or mandibular dislocation. Orbits: The globes are intact. Normal appearance of  the intra- and extraconal fat. Symmetric extraocular muscles and optic nerves. Sinuses: No fluid levels or advanced mucosal thickening. Soft tissues: Normal visualized extracranial soft tissues. CT CERVICAL SPINE FINDINGS Alignment: No static subluxation. Facets are aligned. Occipital condyles and the lateral masses of C1-C2 are aligned. Skull base and vertebrae: No acute fracture. Soft tissues and spinal canal: No prevertebral fluid or swelling. No visible canal hematoma. Disc levels: No advanced spinal canal or neural foraminal stenosis. Upper chest: Fluid filling the left hemithorax. Small right pleural effusion, incompletely visualized. Other: 1.8 cm left parotid mass. IMPRESSION:  1. No acute intracranial abnormality. 2. No facial fracture. 3. No acute fracture or static subluxation of the cervical spine. 4. Fluid filling the left hemithorax. Small right pleural effusion, incompletely visualized. 5. 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 Due to the overlap of the imaging features of benign and malignant parotid masses, histologic sampling is recommended. Electronically Signed   By: Franky Stanford M.D.   On: 08/08/2023 19:19   CT Maxillofacial Wo Contrast Result Date: 08/08/2023 CLINICAL DATA:  Trauma EXAM: CT HEAD WITHOUT CONTRAST CT MAXILLOFACIAL WITHOUT CONTRAST CT CERVICAL SPINE WITHOUT CONTRAST TECHNIQUE: Multidetector CT imaging of the head, cervical spine, and maxillofacial structures were performed using the standard protocol without intravenous contrast. Multiplanar CT image reconstructions of the cervical spine and maxillofacial structures were also generated. RADIATION DOSE REDUCTION: This exam was performed according to the departmental dose-optimization program which includes automated exposure control, adjustment of the mA and/or kV according to patient size and/or use of iterative reconstruction technique. COMPARISON:  05/01/2020 FINDINGS: CT HEAD FINDINGS Brain: There is no mass, hemorrhage or extra-axial  collection. The size and configuration of the ventricles and extra-axial CSF spaces are normal. There is hypoattenuation of the white matter, most commonly indicating chronic small vessel disease. Vascular: Atherosclerotic calcification of the internal carotid arteries at the skull base. No abnormal hyperdensity of the major intracranial arteries or dural venous sinuses. Skull: The visualized skull base, calvarium and extracranial soft tissues are normal. Sinuses/Orbits: No fluid levels or advanced mucosal thickening of the visualized paranasal sinuses. No mastoid or middle ear effusion. Normal orbits. Other: None. CT MAXILLOFACIAL FINDINGS Osseous: No facial fracture or mandibular dislocation. Orbits: The globes are intact. Normal appearance of the intra- and extraconal fat. Symmetric extraocular muscles and optic nerves. Sinuses: No fluid levels or advanced mucosal thickening. Soft tissues: Normal visualized extracranial soft tissues. CT CERVICAL SPINE FINDINGS Alignment: No static subluxation. Facets are aligned. Occipital condyles and the lateral masses of C1-C2 are aligned. Skull base and vertebrae: No acute fracture. Soft tissues and spinal canal: No prevertebral fluid or swelling. No visible canal hematoma. Disc levels: No advanced spinal canal or neural foraminal stenosis. Upper chest: Fluid filling the left hemithorax. Small right pleural effusion, incompletely visualized. Other: 1.8 cm left parotid mass. IMPRESSION: 1. No acute intracranial abnormality. 2. No facial fracture. 3. No acute fracture or static subluxation of the cervical spine. 4. Fluid filling the left hemithorax. Small right pleural effusion, incompletely visualized. 5. 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 Due to the overlap of the imaging features of benign and malignant parotid masses, histologic sampling is recommended. Electronically Signed   By: Franky Stanford M.D.   On: 08/08/2023 19:19   CT Cervical Spine Wo Contrast Result Date:  08/08/2023 CLINICAL DATA:  Trauma EXAM: CT HEAD WITHOUT CONTRAST CT MAXILLOFACIAL WITHOUT CONTRAST CT CERVICAL SPINE WITHOUT CONTRAST TECHNIQUE: Multidetector CT imaging of the head, cervical spine, and maxillofacial structures were performed using the standard protocol without intravenous contrast. Multiplanar CT image reconstructions of the cervical spine and maxillofacial structures were also generated. RADIATION DOSE REDUCTION: This exam was performed according to the departmental dose-optimization program which includes automated exposure control, adjustment of the mA and/or kV according to patient size and/or use of iterative reconstruction technique. COMPARISON:  05/01/2020 FINDINGS: CT HEAD FINDINGS Brain: There is no mass, hemorrhage or extra-axial collection. The size and configuration of the ventricles and extra-axial CSF spaces are normal. There is hypoattenuation of the white matter, most commonly indicating chronic small vessel disease. Vascular: Atherosclerotic  calcification of the internal carotid arteries at the skull base. No abnormal hyperdensity of the major intracranial arteries or dural venous sinuses. Skull: The visualized skull base, calvarium and extracranial soft tissues are normal. Sinuses/Orbits: No fluid levels or advanced mucosal thickening of the visualized paranasal sinuses. No mastoid or middle ear effusion. Normal orbits. Other: None. CT MAXILLOFACIAL FINDINGS Osseous: No facial fracture or mandibular dislocation. Orbits: The globes are intact. Normal appearance of the intra- and extraconal fat. Symmetric extraocular muscles and optic nerves. Sinuses: No fluid levels or advanced mucosal thickening. Soft tissues: Normal visualized extracranial soft tissues. CT CERVICAL SPINE FINDINGS Alignment: No static subluxation. Facets are aligned. Occipital condyles and the lateral masses of C1-C2 are aligned. Skull base and vertebrae: No acute fracture. Soft tissues and spinal canal: No  prevertebral fluid or swelling. No visible canal hematoma. Disc levels: No advanced spinal canal or neural foraminal stenosis. Upper chest: Fluid filling the left hemithorax. Small right pleural effusion, incompletely visualized. Other: 1.8 cm left parotid mass. IMPRESSION: 1. No acute intracranial abnormality. 2. No facial fracture. 3. No acute fracture or static subluxation of the cervical spine. 4. Fluid filling the left hemithorax. Small right pleural effusion, incompletely visualized. 5. 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 Due to the overlap of the imaging features of benign and malignant parotid masses, histologic sampling is recommended. Electronically Signed   By: Franky Stanford M.D.   On: 08/08/2023 19:19   DG Hand 2 View Left Result Date: 08/08/2023 CLINICAL DATA:  Recent fall with left hand pain, initial encounter EXAM: LEFT HAND - 2 VIEW COMPARISON:  None Available. FINDINGS: There is no evidence of fracture or dislocation. There is no evidence of arthropathy or other focal bone abnormality. Soft tissues are unremarkable. IMPRESSION: No acute abnormality noted. Electronically Signed   By: Oneil Devonshire M.D.   On: 08/08/2023 18:12   DG Hand 2 View Right Result Date: 08/08/2023 CLINICAL DATA:  Recent fall with right hand pain, initial encounter EXAM: RIGHT HAND - 2 VIEW COMPARISON:  None Available. FINDINGS: There is no evidence of fracture or dislocation. There is no evidence of arthropathy or other focal bone abnormality. Soft tissues are unremarkable. IMPRESSION: No acute abnormality noted. Electronically Signed   By: Oneil Devonshire M.D.   On: 08/08/2023 18:12   DG Chest Port 1 View Result Date: 08/08/2023 CLINICAL DATA:  Recent fall with chest pain, initial encounter EXAM: PORTABLE CHEST 1 VIEW COMPARISON:  07/20/2023 FINDINGS: Cardiac shadow is enlarged. Defibrillator is again noted and stable. Aortic calcifications are seen. Patient rotation to the right is noted accentuating the mediastinal  markings. The lungs are well aerated. Small left pleural effusion remains. No new focal abnormality is noted. IMPRESSION: No significant change from the prior exam. Electronically Signed   By: Oneil Devonshire M.D.   On: 08/08/2023 18:04   CUP PACEART REMOTE DEVICE CHECK Result Date: 08/02/2023 Monthly battery check.  Estimated 4 months. Normal device function. No new alerts. Follow up as scheduled monthly [place], CVRS  CUP PACEART INCLINIC DEVICE CHECK Result Date: 07/27/2023 CRT-D device check in office. Thresholds and sensing consistent with previous device measurements. Lead impedance trends stable over time.  AT/AF BURDEN 0%, 5 NSVT events, 3 Treated VT events in June 2024 with 2 shocks successfully converting arrhythmia.  Patient biventricular pacing 98.1% of the time. Device programmed with appropriate safety margins. Heart failure diagnostics reviewed and trends are stable for patient.  No changes made this session. Estimated longevity _4 MTHS TO ERI_.  Patient enrolled in remote follow up. Plan to check device remotely monthly to monitor for ERI, will schedule gen change at time and follow up with provider afterward. ERROR with save file - see scanned report to EPIC. No changes made.Lorn Fees, RN   Microbiology: Results for orders placed or performed during the hospital encounter of 08/08/23  Body fluid culture w Gram Stain     Status: None   Collection Time: 08/08/23  9:34 PM   Specimen: Pleural Fluid  Result Value Ref Range Status   Specimen Description FLUID PLEURAL  Final   Special Requests NONE  Final   Gram Stain NO WBC SEEN NO ORGANISMS SEEN   Final   Culture   Final    NO GROWTH 3 DAYS Performed at [facility], [address], [place], [place] [address]    Report Status 08/12/2023 FINAL  Final  Blood culture (routine x 2)     Status: None   Collection Time: 08/08/23  9:36 PM   Specimen: BLOOD RIGHT ARM  Result Value Ref Range Status   Specimen Description BLOOD  RIGHT ARM  Final   Special Requests   Final    BOTTLES DRAWN AEROBIC AND ANAEROBIC Blood Culture adequate volume   Culture   Final    NO GROWTH 5 DAYS Performed at [facility], [address], [place], [place] [address]    Report Status 08/13/2023 FINAL  Final  Blood culture (routine x 2)     Status: None   Collection Time: 08/08/23  9:36 PM   Specimen: BLOOD LEFT HAND  Result Value Ref Range Status   Specimen Description BLOOD LEFT HAND  Final   Special Requests   Final    BOTTLES DRAWN AEROBIC AND ANAEROBIC Blood Culture adequate volume   Culture   Final    NO GROWTH 5 DAYS Performed at [facility], [address], [place], [place] [address]    Report Status 08/13/2023 FINAL  Final  Culture, body fluid w Gram Stain-bottle     Status: None   Collection Time: 08/14/23  1:08 PM   Specimen: Pleura  Result Value Ref Range Status   Specimen Description PLEURAL  Final   Special Requests NONE  Final   Culture   Final    NO GROWTH 5 DAYS Performed at [name], [address], [place], [place] [address]    Report Status 08/19/2023 FINAL  Final  Gram stain     Status: None   Collection Time: 08/14/23  1:08 PM   Specimen: Pleura  Result Value Ref Range Status   Specimen Description PLEURAL  Final   Special Requests NONE  Final   Gram Stain   Final    WBC PRESENT, PREDOMINANTLY MONONUCLEAR NO ORGANISMS SEEN CYTOSPIN SMEAR Performed at [facility], [address] [place], [place] [address]    Report Status 08/14/2023 FINAL  Final    Labs: CBC: Recent Labs  Lab 08/15/23 0257 08/16/23 0232 08/18/23 0320 08/20/23 0305  WBC 5.2 4.9 5.8 6.3  HGB 8.0* 8.5* 8.8* 9.0*  HCT 24.5* 26.5* 26.7* 27.6*  MCV 98.0 97.8 97.8 97.2  PLT 201 206 253 286   Basic Metabolic Panel: Recent Labs  Lab 08/17/23 0306 08/18/23 0320 08/19/23 0306 08/20/23 0305 08/21/23 0226  NA 136 135 134* 137 135  K 3.4* 4.3 3.7 3.9 4.2  CL 92* 91* 90* 91* 93*  CO2 33*  35* 36* 35*  33*  GLUCOSE 116* 100* 118* 113* 121*  BUN 22 22 26* 28* 29*  CREATININE 1.80* 2.00* 2.18* 2.21* 2.27*  CALCIUM  8.4* 8.4* 8.3* 8.8* 8.3*  MG  --   --   --  1.9 2.2   Liver Function Tests: Recent Labs  Lab 08/18/23 0320  AST 39  ALT 41  ALKPHOS 61  BILITOT 1.1  PROT 5.2*  ALBUMIN  2.6*   CBG: No results for input(s): GLUCAP in the last 168 hours.  Discharge time spent: greater than 30 minutes.  Signed: Elidia Toribio Furnace, MD [facility] 08/21/2023

## 2023-08-22 ENCOUNTER — Encounter: Payer: Self-pay | Admitting: Internal Medicine

## 2023-08-23 NOTE — Telephone Encounter (Signed)
 Addressed in prior message 08/23/23. Pt sent 2 messages.

## 2023-08-26 ENCOUNTER — Other Ambulatory Visit: Payer: Self-pay

## 2023-08-26 ENCOUNTER — Telehealth: Payer: Self-pay | Admitting: Internal Medicine

## 2023-08-26 NOTE — Telephone Encounter (Signed)
Left a message for Onalee Hua with [facility] re: getting verbal orders... I LM that he may need to reach out to her PCP either Dr Donnelly Stager or form a recent call per Dr Ladona Ridgel:   Refer her to one of Dr. Laurey Morale partners at [facility].   Will send to Dr Lubertha Basque nurse for review.

## 2023-08-26 NOTE — Telephone Encounter (Signed)
Lisa [name] with [facility] calling to get verbal orders: Frequency of 2 week for 1 wk 1 Strength Balance Gait Condition management Fall prevention [phone]

## 2023-08-27 ENCOUNTER — Other Ambulatory Visit: Payer: Self-pay

## 2023-08-27 DIAGNOSIS — I5022 Chronic systolic (congestive) heart failure: Secondary | ICD-10-CM

## 2023-08-27 DIAGNOSIS — E039 Hypothyroidism, unspecified: Secondary | ICD-10-CM

## 2023-08-27 DIAGNOSIS — N183 Chronic kidney disease, stage 3 unspecified: Secondary | ICD-10-CM

## 2023-08-27 DIAGNOSIS — I10 Essential (primary) hypertension: Secondary | ICD-10-CM

## 2023-08-27 NOTE — Telephone Encounter (Signed)
Referral placed.

## 2023-09-01 ENCOUNTER — Ambulatory Visit: Payer: Self-pay | Admitting: Nurse Practitioner

## 2023-09-02 ENCOUNTER — Ambulatory Visit (INDEPENDENT_AMBULATORY_CARE_PROVIDER_SITE_OTHER): Payer: Medicare Other

## 2023-09-02 ENCOUNTER — Ambulatory Visit: Payer: Medicare Other | Attending: Internal Medicine

## 2023-09-02 DIAGNOSIS — I5022 Chronic systolic (congestive) heart failure: Secondary | ICD-10-CM

## 2023-09-02 DIAGNOSIS — Z95810 Presence of automatic (implantable) cardiac defibrillator: Secondary | ICD-10-CM

## 2023-09-02 DIAGNOSIS — I255 Ischemic cardiomyopathy: Secondary | ICD-10-CM

## 2023-09-02 LAB — CUP PACEART REMOTE DEVICE CHECK
Battery Remaining Longevity: 2 mo
Battery Voltage: 2.75 V
Brady Statistic AP VP Percent: 0.04 %
Brady Statistic AP VS Percent: 0.01 %
Brady Statistic AS VP Percent: 98.41 %
Brady Statistic AS VS Percent: 1.54 %
Brady Statistic RA Percent Paced: 0.05 %
Brady Statistic RV Percent Paced: 0.49 %
Date Time Interrogation Session: 20250123043823
HighPow Impedance: 43 Ohm
Implantable Lead Connection Status: 753985
Implantable Lead Connection Status: 753985
Implantable Lead Connection Status: 753985
Implantable Lead Implant Date: 20170612
Implantable Lead Implant Date: 20170612
Implantable Lead Implant Date: 20170612
Implantable Lead Location: 753858
Implantable Lead Location: 753859
Implantable Lead Location: 753860
Implantable Lead Model: 4398
Implantable Lead Model: 5076
Implantable Pulse Generator Implant Date: 20170612
Lead Channel Impedance Value: 132.321
Lead Channel Impedance Value: 155.257
Lead Channel Impedance Value: 155.257
Lead Channel Impedance Value: 169.459
Lead Channel Impedance Value: 169.459
Lead Channel Impedance Value: 247 Ohm
Lead Channel Impedance Value: 247 Ohm
Lead Channel Impedance Value: 285 Ohm
Lead Channel Impedance Value: 304 Ohm
Lead Channel Impedance Value: 399 Ohm
Lead Channel Impedance Value: 418 Ohm
Lead Channel Impedance Value: 418 Ohm
Lead Channel Impedance Value: 513 Ohm
Lead Channel Impedance Value: 551 Ohm
Lead Channel Impedance Value: 589 Ohm
Lead Channel Impedance Value: 608 Ohm
Lead Channel Impedance Value: 646 Ohm
Lead Channel Impedance Value: 646 Ohm
Lead Channel Pacing Threshold Amplitude: 1 V
Lead Channel Pacing Threshold Amplitude: 1.25 V
Lead Channel Pacing Threshold Amplitude: 1.5 V
Lead Channel Pacing Threshold Pulse Width: 0.4 ms
Lead Channel Pacing Threshold Pulse Width: 0.4 ms
Lead Channel Pacing Threshold Pulse Width: 0.8 ms
Lead Channel Sensing Intrinsic Amplitude: 2.25 mV
Lead Channel Sensing Intrinsic Amplitude: 2.25 mV
Lead Channel Sensing Intrinsic Amplitude: 4.25 mV
Lead Channel Sensing Intrinsic Amplitude: 4.25 mV
Lead Channel Setting Pacing Amplitude: 1.75 V
Lead Channel Setting Pacing Amplitude: 2 V
Lead Channel Setting Pacing Amplitude: 2.5 V
Lead Channel Setting Pacing Pulse Width: 0.4 ms
Lead Channel Setting Pacing Pulse Width: 0.8 ms
Lead Channel Setting Sensing Sensitivity: 0.3 mV
Zone Setting Status: 755011
Zone Setting Status: 755011

## 2023-09-02 NOTE — Progress Notes (Signed)
EPIC Encounter for ICM Monitoring  Patient Name: Lisa [name] is a 83 y.o. female Date: 09/02/2023 Primary Care Physican: Elveria Rising, DO Primary Cardiologist: Tenny Craw Electrophysiologist: Rae Roam Pacing: 98.3%         11/12/2021 Weight: 248 lbs 01/21/2022 Weight: 246 lbs 09/01/2022 Office Weight: 205 lbs 04/23/2023 Office Weight: 206 lbs 09/02/2023 Weight: 170 lbs   Battery ERI:  2 months - 1/23 discussed battery replacement                                                            Spoke with patient and heart failure questions reviewed.  Transmission results reviewed.  Pt reports she was hospitalized 12/29-1/11 with dx pleural effusion.  She is still weak post discharge but getting stronger. Using rollator for support and has daytime caregivers to assist her.  She lost 30 pounds since hospitalization.     Optivol thoracic impedance suggesting normal fluid levels since 1/11 hospital discharge.    Prescribed:  Torsemide 20 mg take 2 tablets (40 mg total) by mouth daily.  09/02/2023 Pt reports taking 20 mg daily instead of 40 mg as prescribed.   Discussed needing new script since hospitalist wrote script for 15 days.    Labs: 08/21/2023 Creatinine 2.27, BUN 29, Potassium 4.2, Sodium 135, GFR 21  08/20/2023 Creatinine 2.21, BUN 28, Potassium 3.9, Sodium 137, GFR 22  08/19/2023 Creatinine 2.18, BUN 26, Potassium 3.7, Sodium 134, GFR 22  08/18/2023 Creatinine 2.00, BUN 22, Potassium 4.3, Sodium 135, GFR 25 08/17/2023 Creatinine 1.80, BUN 22, Potassium 3.4, Sodium 136, GFR 28  A complete set of results can be found in Results Review.   Recommendations:  No changes and encouraged to call if experiencing any fluid symptoms.   Follow-up plan: ICM clinic phone appointment on 09/14/2023 to recheck fluid levels post hospitalization.   91 day device clinic remote transmission 11/01/2023.     EP/Cardiology Office Visits:  09/07/2023 with Dr Tenny Craw.     Copy of ICM check sent to Dr.  Ladona Ridgel.     3 month ICM trend: 09/02/2023.    12-14 Month ICM trend:     Karie Soda, RN 09/02/2023 7:33 AM

## 2023-09-03 ENCOUNTER — Encounter: Payer: Self-pay | Admitting: Internal Medicine

## 2023-09-06 ENCOUNTER — Telehealth: Payer: Self-pay | Admitting: Internal Medicine

## 2023-09-06 DIAGNOSIS — N183 Chronic kidney disease, stage 3 unspecified: Secondary | ICD-10-CM

## 2023-09-06 DIAGNOSIS — I10 Essential (primary) hypertension: Secondary | ICD-10-CM

## 2023-09-06 DIAGNOSIS — I5022 Chronic systolic (congestive) heart failure: Secondary | ICD-10-CM

## 2023-09-06 DIAGNOSIS — Z79899 Other long term (current) drug therapy: Secondary | ICD-10-CM

## 2023-09-06 NOTE — Telephone Encounter (Signed)
Pt called in asking to speak with nurse about her appt tomorrow, her hospital stay, and her current medications. Please advise.

## 2023-09-06 NOTE — Telephone Encounter (Signed)
Patient reports that since coming home from the hospital she has been doing good. She has a caregiver that comes by and helps her. She states that she is still not strong enough to get up and down her stairs so she is not able to make it to her appointment with Dr. Tenny Craw tomorrow.  She states that she is also needing a new prescription for torsemide. She was discharged from the hospital on torsemide 40 mg daily, however she reports that she was told to only take 40 for 15 days so she is back to taking 20 mg daily. She recently had her device checked and optivol levels were good, showing normal fluid levels. She reports that her weight this morning was 178.4lbs. She would like to know if she should continue with torsemide 20 mg daily.  She reports that she will reschedule her appointment once she gets stronger.

## 2023-09-07 ENCOUNTER — Encounter: Payer: Self-pay | Admitting: Internal Medicine

## 2023-09-07 ENCOUNTER — Ambulatory Visit: Payer: Medicare Other | Admitting: Internal Medicine

## 2023-09-07 DIAGNOSIS — I5022 Chronic systolic (congestive) heart failure: Secondary | ICD-10-CM

## 2023-09-07 DIAGNOSIS — E039 Hypothyroidism, unspecified: Secondary | ICD-10-CM

## 2023-09-07 DIAGNOSIS — I255 Ischemic cardiomyopathy: Secondary | ICD-10-CM

## 2023-09-07 DIAGNOSIS — I10 Essential (primary) hypertension: Secondary | ICD-10-CM

## 2023-09-07 DIAGNOSIS — N183 Chronic kidney disease, stage 3 unspecified: Secondary | ICD-10-CM

## 2023-09-07 DIAGNOSIS — I2510 Atherosclerotic heart disease of native coronary artery without angina pectoris: Secondary | ICD-10-CM

## 2023-09-07 DIAGNOSIS — I5043 Acute on chronic combined systolic (congestive) and diastolic (congestive) heart failure: Secondary | ICD-10-CM

## 2023-09-07 DIAGNOSIS — Z79899 Other long term (current) drug therapy: Secondary | ICD-10-CM

## 2023-09-07 NOTE — Telephone Encounter (Signed)
Needs to have BMET and BNP and CBC drawn before I can call in Rx for labs

## 2023-09-07 NOTE — Telephone Encounter (Signed)
Pt asking to have her Torsemide refilled.. she was prescribed in the hospital: 08/21/23...   Torsemide has been increased to 40 mg po daily Holding metoprolol due to risk of hypotension.  Guideline directed medical therapy for heart failure is limited due to hypotension and low GFR. Possible addition of SGLT 2 inh and mineralocorticoid receptor antagonist as outpatient if renal function more stable.  Holding on K supplements until follow up renal function as outpatient.  Follow up renal function and electrolytes in 7 days.  Follow up with Dr Katrinka Blazing in 7 to 10 days.  Follow up with Cardiology as scheduled.  Follow up parathyroid mass.  Patient declined SNF.     Pt canceled appt with Dr Tenny Craw due to being weak.   Recent heart failure note from [facility]...  Optivol thoracic impedance suggesting normal fluid levels since 1/11 hospital discharge.    Prescribed:  Torsemide 20 mg take 2 tablets (40 mg total) by mouth daily.  09/02/2023 Pt reports taking 20 mg daily instead of 40 mg as prescribed.   Discussed needing new script since hospitalist wrote script for 15 days.   Will talk with Dr Tenny Craw plan to send in what dose and when she will need to get labs even though she says she is too weak to leave her house at this time.  NO post hosp labs done as in plan.

## 2023-09-07 NOTE — Progress Notes (Signed)
Remote ICD transmission.

## 2023-09-09 NOTE — Telephone Encounter (Signed)
Keep on torsemide 20 mg daily   Weigh daily if able Next week she will need to have BMET and BNP checked  (and any other labs pending) If cannot come in see if someone (visiting nurse) can go out

## 2023-09-13 ENCOUNTER — Other Ambulatory Visit: Payer: Self-pay

## 2023-09-13 DIAGNOSIS — I5022 Chronic systolic (congestive) heart failure: Secondary | ICD-10-CM

## 2023-09-13 DIAGNOSIS — Z79899 Other long term (current) drug therapy: Secondary | ICD-10-CM

## 2023-09-13 DIAGNOSIS — I10 Essential (primary) hypertension: Secondary | ICD-10-CM

## 2023-09-13 DIAGNOSIS — N183 Chronic kidney disease, stage 3 unspecified: Secondary | ICD-10-CM

## 2023-09-13 MED ORDER — TORSEMIDE 20 MG PO TABS: 20.0000 mg | ORAL_TABLET | Freq: Every day | ORAL | 0 refills | Status: DC

## 2023-09-13 NOTE — Telephone Encounter (Signed)
Pt advised and she says that she is getting stronger an feels she is able to get out next week with her aide and have he labs drawn.

## 2023-09-14 ENCOUNTER — Ambulatory Visit: Payer: Medicare Other | Attending: Internal Medicine

## 2023-09-14 DIAGNOSIS — I5022 Chronic systolic (congestive) heart failure: Secondary | ICD-10-CM

## 2023-09-14 DIAGNOSIS — Z95810 Presence of automatic (implantable) cardiac defibrillator: Secondary | ICD-10-CM

## 2023-09-15 NOTE — Progress Notes (Signed)
 EPIC Encounter for ICM Monitoring  Patient Name: Lisa [name] is a 83 y.o. female Date: 09/15/2023 Primary Care Physican: Claudene Thersia Reusing, DO Primary Cardiologist: Okey Electrophysiologist: Waddell Pore Pacing: 98.3%         11/12/2021 Weight: 248 lbs 01/21/2022 Weight: 246 lbs 09/01/2022 Office Weight: 205 lbs 04/23/2023 Office Weight: 206 lbs 09/02/2023 Weight: 170 lbs 2/4/202555 Weight: 172 lbs   Battery ERI:  2 months - 1/23 discussed battery replacement                                                            Spoke with patient and heart failure questions reviewed.  Transmission results reviewed.  Pt is getting PT to help with strengthening since hospital discharge.  She now has cold and congestion.      Optivol thoracic impedance suggesting normal fluid levels since 1/11 hospital discharge.    Prescribed:  Torsemide  20 mg take 2 tablets (40 mg total) by mouth daily.  09/02/2023 Pt reports taking 20 mg daily instead of 40 mg as prescribed.   Discussed needing new script since hospitalist wrote script for 15 days.    Labs: 08/21/2023 Creatinine 2.27, BUN 29, Potassium 4.2, Sodium 135, GFR 21  08/20/2023 Creatinine 2.21, BUN 28, Potassium 3.9, Sodium 137, GFR 22  08/19/2023 Creatinine 2.18, BUN 26, Potassium 3.7, Sodium 134, GFR 22  08/18/2023 Creatinine 2.00, BUN 22, Potassium 4.3, Sodium 135, GFR 25 08/17/2023 Creatinine 1.80, BUN 22, Potassium 3.4, Sodium 136, GFR 28  A complete set of results can be found in Results Review.   Recommendations:  No changes and encouraged to call if experiencing any fluid symptoms.   Follow-up plan: ICM clinic phone appointment on 10/04/2023.   91 day device clinic remote transmission 11/01/2023.     EP/Cardiology Office Visits:  Pt canceled 09/07/2023 with Dr Okey due to unable to walk outside the home.     Copy of ICM check sent to Dr. Waddell.     3 month ICM trend: 09/14/2023.    12-14 Month ICM trend:     Mitzie GORMAN Garner,  RN 09/15/2023 2:04 PM

## 2023-10-04 ENCOUNTER — Ambulatory Visit: Payer: Medicare Other | Attending: Internal Medicine

## 2023-10-04 DIAGNOSIS — I5022 Chronic systolic (congestive) heart failure: Secondary | ICD-10-CM | POA: Diagnosis not present

## 2023-10-04 DIAGNOSIS — Z95810 Presence of automatic (implantable) cardiac defibrillator: Secondary | ICD-10-CM

## 2023-10-04 NOTE — Progress Notes (Signed)
 EPIC Encounter for ICM Monitoring  Patient Name: Lisa [name] is a 83 y.o. female Date: 10/04/2023 Primary Care Physican: Elveria Rising, DO Primary Cardiologist: Tenny Craw Electrophysiologist: Rae Roam Pacing: 97.3%         11/12/2021 Weight: 248 lbs 01/21/2022 Weight: 246 lbs 09/01/2022 Office Weight: 205 lbs 04/23/2023 Office Weight: 206 lbs 09/02/2023 Weight: 170 lbs 09/14/2023 Weight: 172 lbs   Battery ERI:  1 months - 1/23 discussed battery replacement                                                            Spoke with patient and heart failure questions reviewed.  Transmission results reviewed.  Pt is still trying to get her strength back after previous hospitalization.   Optivol thoracic impedance suggesting normal fluid levels with exception of possible fluid accumulation today 2/24 due to patient missed Torsemide dose Sunday.    Prescribed:  Torsemide 20 mg take 2 tablets (40 mg total) by mouth daily.  09/02/2023 Pt reports taking 20 mg daily instead of 40 mg as prescribed.   Discussed needing new script since hospitalist wrote script for 15 days.    Labs: 08/21/2023 Creatinine 2.27, BUN 29, Potassium 4.2, Sodium 135, GFR 21  08/20/2023 Creatinine 2.21, BUN 28, Potassium 3.9, Sodium 137, GFR 22  08/19/2023 Creatinine 2.18, BUN 26, Potassium 3.7, Sodium 134, GFR 22  08/18/2023 Creatinine 2.00, BUN 22, Potassium 4.3, Sodium 135, GFR 25 08/17/2023 Creatinine 1.80, BUN 22, Potassium 3.4, Sodium 136, GFR 28  A complete set of results can be found in Results Review.   Recommendations:  No changes and encouraged to call if experiencing any fluid symptoms.   Follow-up plan: ICM clinic phone appointment on 11/08/2023.   91 day device clinic remote transmission 11/01/2023.     EP/Cardiology Office Visits:  Pt canceled 09/07/2023 with Dr Tenny Craw due to unable to walk outside the home.  Last EP appt was 07/27/2023 with Dr Ladona Ridgel.   Copy of ICM check sent to Dr. Ladona Ridgel.     3  month ICM trend: 10/04/2023.    12-14 Month ICM trend:     Karie Soda, RN 10/04/2023 3:57 PM

## 2023-10-13 NOTE — Progress Notes (Signed)
 Remote ICD transmission.

## 2023-10-25 ENCOUNTER — Telehealth: Payer: Self-pay

## 2023-10-25 NOTE — Telephone Encounter (Signed)
 I called pt to give her dates for procedure. She is scheduled to see Dr. Ladona Ridgel on 3/24 and wants to discuss with her Daughter and will decide on a date at that time.

## 2023-10-25 NOTE — Telephone Encounter (Signed)
 Alert remote transmission: RRT triggered 10/23/23; routed to clinic for review.  Follow up as scheduled. [facility], CV[facility]  Called and spoke with patient. Made aware of RRT status. Needs to come to device clinic to disable audible alarm. Wants to wait to pick up preop supplies at the same time. Sent to [employer] for scheduling.

## 2023-11-01 ENCOUNTER — Ambulatory Visit: Attending: Internal Medicine | Admitting: Internal Medicine

## 2023-11-01 ENCOUNTER — Ambulatory Visit: Payer: Medicare Other

## 2023-11-01 ENCOUNTER — Encounter: Payer: Self-pay | Admitting: Internal Medicine

## 2023-11-01 ENCOUNTER — Telehealth: Payer: Self-pay | Admitting: Internal Medicine

## 2023-11-01 VITALS — BP 132/70 | HR 74 | Ht 61.0 in | Wt 174.0 lb

## 2023-11-01 DIAGNOSIS — I5022 Chronic systolic (congestive) heart failure: Secondary | ICD-10-CM

## 2023-11-01 DIAGNOSIS — I4901 Ventricular fibrillation: Secondary | ICD-10-CM

## 2023-11-01 DIAGNOSIS — Z01812 Encounter for preprocedural laboratory examination: Secondary | ICD-10-CM

## 2023-11-01 DIAGNOSIS — I10 Essential (primary) hypertension: Secondary | ICD-10-CM

## 2023-11-01 DIAGNOSIS — I2510 Atherosclerotic heart disease of native coronary artery without angina pectoris: Secondary | ICD-10-CM

## 2023-11-01 DIAGNOSIS — I472 Ventricular tachycardia, unspecified: Secondary | ICD-10-CM

## 2023-11-01 DIAGNOSIS — Z95810 Presence of automatic (implantable) cardiac defibrillator: Secondary | ICD-10-CM | POA: Diagnosis present

## 2023-11-01 DIAGNOSIS — I255 Ischemic cardiomyopathy: Secondary | ICD-10-CM

## 2023-11-01 DIAGNOSIS — I493 Ventricular premature depolarization: Secondary | ICD-10-CM

## 2023-11-01 LAB — CUP PACEART REMOTE DEVICE CHECK
Battery Remaining Longevity: 1 mo
Battery Voltage: 2.71 V
Brady Statistic AP VP Percent: 0.05 %
Brady Statistic AP VS Percent: 0.01 %
Brady Statistic AS VP Percent: 98.46 %
Brady Statistic AS VS Percent: 1.48 %
Brady Statistic RA Percent Paced: 0.07 %
Brady Statistic RV Percent Paced: 0.7 %
Date Time Interrogation Session: 20250324043824
HighPow Impedance: 39 Ohm
Implantable Lead Connection Status: 753985
Implantable Lead Connection Status: 753985
Implantable Lead Connection Status: 753985
Implantable Lead Implant Date: 20170612
Implantable Lead Implant Date: 20170612
Implantable Lead Implant Date: 20170612
Implantable Lead Location: 753858
Implantable Lead Location: 753859
Implantable Lead Location: 753860
Implantable Lead Model: 4398
Implantable Lead Model: 5076
Implantable Pulse Generator Implant Date: 20170612
Lead Channel Impedance Value: 147.097
Lead Channel Impedance Value: 166.25 Ohm
Lead Channel Impedance Value: 169.459
Lead Channel Impedance Value: 172.541
Lead Channel Impedance Value: 176 Ohm
Lead Channel Impedance Value: 247 Ohm
Lead Channel Impedance Value: 285 Ohm
Lead Channel Impedance Value: 285 Ohm
Lead Channel Impedance Value: 304 Ohm
Lead Channel Impedance Value: 361 Ohm
Lead Channel Impedance Value: 399 Ohm
Lead Channel Impedance Value: 418 Ohm
Lead Channel Impedance Value: 475 Ohm
Lead Channel Impedance Value: 551 Ohm
Lead Channel Impedance Value: 589 Ohm
Lead Channel Impedance Value: 589 Ohm
Lead Channel Impedance Value: 608 Ohm
Lead Channel Impedance Value: 608 Ohm
Lead Channel Pacing Threshold Amplitude: 0.875 V
Lead Channel Pacing Threshold Amplitude: 1.125 V
Lead Channel Pacing Threshold Amplitude: 1.625 V
Lead Channel Pacing Threshold Pulse Width: 0.4 ms
Lead Channel Pacing Threshold Pulse Width: 0.4 ms
Lead Channel Pacing Threshold Pulse Width: 0.8 ms
Lead Channel Sensing Intrinsic Amplitude: 2.125 mV
Lead Channel Sensing Intrinsic Amplitude: 2.125 mV
Lead Channel Sensing Intrinsic Amplitude: 5 mV
Lead Channel Sensing Intrinsic Amplitude: 5 mV
Lead Channel Setting Pacing Amplitude: 1.75 V
Lead Channel Setting Pacing Amplitude: 2 V
Lead Channel Setting Pacing Amplitude: 2.5 V
Lead Channel Setting Pacing Pulse Width: 0.4 ms
Lead Channel Setting Pacing Pulse Width: 0.8 ms
Lead Channel Setting Sensing Sensitivity: 0.3 mV
Zone Setting Status: 755011
Zone Setting Status: 755011

## 2023-11-01 NOTE — Telephone Encounter (Signed)
 Pt called in to set up procedure for battery change.

## 2023-11-01 NOTE — Progress Notes (Signed)
 HPI [name] returns today for followup. She is a pleasant obese 83 yo woman with chronic systolic heart failure, PAF, and VT. She is s/p Biv ICD insertion. She remains on amiodarone 200 mg daily. She is fairly sedentary. No additional syncope or ICD therapies. 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 She has gotten a new leg wrap which appears to be helping some. She was in the hospital a few months ago with CHF. She had syncope. She has now reached ERI on her device. She admits to some dietary indiscretion.  Allergies  Allergen Reactions   Flagyl [Metronidazole] Itching, Rash and Other (See Comments)    Welts, also   Penicillins Hives, Itching and Rash    Has patient had a PCN reaction causing immediate rash, facial/tongue/throat swelling, SOB or lightheadedness with hypotension: Yes  Has patient had a PCN reaction causing severe rash involving mucus membranes or skin necrosis: Yes Has patient had a PCN reaction that required hospitalization No Has patient had a PCN reaction occurring within the last 10 years: NO If all of the above answers are "NO", then may proceed with Cephalosporin use.    Sulfamethoxazole Rash   Meperidine Nausea And Vomiting   Morphine And Codeine Nausea And Vomiting   Propoxyphene     Other reaction(s): Unknown   Tape Other (See Comments)    "THE PLASTIC, CLEAR TAPE CAN/DOES PULL OFF MY SKIN."   Doxycycline Nausea Only, Rash and Other (See Comments)    Made her "feel terrible"   Lanolin Itching and Rash   Sulfa Antibiotics Rash     Current Outpatient Medications  Medication Sig Dispense Refill   acetaminophen (TYLENOL) 325 MG tablet Take 2 tablets (650 mg total) by mouth every 6 (six) hours as needed for mild pain (pain score 1-3) or moderate pain (pain score 4-6).     allopurinol (ZYLOPRIM) 100 MG tablet Take 100 mg by mouth daily.      aluminum hydroxide-magnesium carbonate (GAVISCON) 95-358 MG/15ML SUSP Take 30 mLs by mouth  as needed for indigestion or heartburn.     amiodarone (PACERONE) 200 MG tablet Take 1 tablet (200 mg total) by mouth daily. (Patient taking differently: Take 200 mg by mouth at bedtime.) 90 tablet 3   aspirin EC 81 MG tablet Take 81 mg by mouth in the morning.     atorvastatin (LIPITOR) 20 MG tablet Take 1 tablet (20 mg total) by mouth daily. 90 tablet 3   b complex vitamins capsule Take 1 capsule by mouth daily.     calcium carbonate (OS-CAL - DOSED IN MG OF ELEMENTAL CALCIUM) 1250 (500 Ca) MG tablet Take 1 tablet by mouth daily as needed (Calcium levels).     Cholecalciferol (VITAMIN D-3 PO) Take 5,000 Units by mouth daily.     Cyanocobalamin (VITAMIN B12 SL) Place 1 tablet under the tongue daily.     imatinib (GLEEVEC) 400 MG tablet Take 400 mg by mouth at bedtime.     loperamide (IMODIUM) 2 MG capsule Take 2 mg by mouth as needed for diarrhea or loose stools.     MAGNESIUM GLYCINATE PO Take 1 tablet by mouth at bedtime.     Multiple Vitamin (MULTIVITAMIN WITH MINERALS) TABS tablet Take 1 tablet by mouth daily.     nitroGLYCERIN (NITROSTAT) 0.4 MG SL tablet Place 1 tablet (0.4 mg total) under the tongue every 5 (five) minutes x 3 doses as needed for chest pain. 25 tablet 0  thyroid (ARMOUR) 15 MG tablet Take 15 mg by mouth daily before breakfast.      timolol (TIMOPTIC) 0.5 % ophthalmic solution Place 1 drop into the left eye 2 (two) times daily.     torsemide (DEMADEX) 20 MG tablet Take 1 tablet (20 mg total) by mouth daily. 60 tablet 0   vitamin C (ASCORBIC ACID) 500 MG tablet Take 500 mg by mouth daily.     No current facility-administered medications for this visit.     Past Medical History:  Diagnosis Date   CAD in native artery 2017   stent to LAD   Cardiac arrest (HCC) 09/2015   CHF (congestive heart failure) (HCC)    CML (chronic myelocytic leukemia) (HCC)    Complication of anesthesia    Hx: UTI (urinary tract infection)    Hypertension    Melanoma (HCC)    PONV  (postoperative nausea and vomiting)    PVC (premature ventricular contraction)    HISTORY OF   Thyroid disease    V tach (HCC)     ROS:   All systems reviewed and negative except as noted in the HPI.   Past Surgical History:  Procedure Laterality Date   ABDOMINAL HYSTERECTOMY     APPENDECTOMY     CARDIAC CATHETERIZATION N/A 10/03/2015   Procedure: Left Heart Cath and Coronary Angiography;  Surgeon: Peter M Swaziland, MD;  Location: [facility] INVASIVE CV LAB;  Service: Cardiovascular;  Laterality: N/A;   CARDIAC CATHETERIZATION N/A 10/07/2015   Procedure: Coronary Stent Intervention;  Surgeon: Kathleene Hazel, MD;  Location: [facility] INVASIVE CV LAB;  Service: Cardiovascular;  Laterality: N/A;   CARDIAC CATHETERIZATION  09/22/2019   EP IMPLANTABLE DEVICE N/A 01/20/2016   Procedure: BiV ICD Insertion CRT-D;  Surgeon: Marinus Maw, MD;  Location: [facility] INVASIVE CV LAB;  Service: Cardiovascular;  Laterality: N/A;   IR THORACENTESIS ASP PLEURAL SPACE W/IMG GUIDE  07/20/2023   LEFT HEART CATH AND CORONARY ANGIOGRAPHY N/A 09/22/2019   Procedure: LEFT HEART CATH AND CORONARY ANGIOGRAPHY;  Surgeon: Swaziland, Peter M, MD;  Location: [facility] INVASIVE CV LAB;  Service: Cardiovascular;  Laterality: N/A;   TONSILLECTOMY     TONSILLECTOMY     TUBAL LIGATION       Family History  Problem Relation Age of Onset   Coronary artery disease Mother    Thyroid disease Mother    Multiple myeloma Father    Heart attack Maternal Grandfather    Heart attack Paternal Grandfather      Social History   Socioeconomic History   Marital status: Divorced    Spouse name: Not on file   Number of children: Not on file   Years of education: Not on file   Highest education level: Not on file  Occupational History   Not on file  Tobacco Use   Smoking status: Never   Smokeless tobacco: Never  Vaping Use   Vaping status: Never Used  Substance and Sexual Activity   Alcohol use: No    Alcohol/week: 0.0 standard drinks of  alcohol   Drug use: No   Sexual activity: Not on file  Other Topics Concern   Not on file  Social History Narrative   Not on file   Social Drivers of Health   Financial Resource Strain: Low Risk  (10/28/2022)   Received from [facility]   Overall Financial Resource Strain (CARDIA)    Difficulty of Paying Living Expenses: Not hard at all  Food Insecurity: No  Food Insecurity (08/09/2023)   Hunger Vital Sign    Worried About Running Out of Food in the Last Year: Never true    Ran Out of Food in the Last Year: Never true  Transportation Needs: No Transportation Needs (08/09/2023)   PRAPARE - Administrator, Civil Service (Medical): No    Lack of Transportation (Non-Medical): No  Physical Activity: Not on file  Stress: Not on file  Social Connections: Moderately Isolated (08/09/2023)   Social Connection and Isolation Panel [NHANES]    Frequency of Communication with Friends and Family: More than three times a week    Frequency of Social Gatherings with Friends and Family: Twice a week    Attends Religious Services: Never    Database administrator or Organizations: Yes    Attends Engineer, structural: More than 4 times per year    Marital Status: Divorced  Catering manager Violence: Not At Risk (08/09/2023)   Humiliation, Afraid, Rape, and Kick questionnaire    Fear of Current or Ex-Partner: No    Emotionally Abused: No    Physically Abused: No    Sexually Abused: No     BP 132/70   Pulse 74   Ht 5\' 1"  (1.549 m)   Wt 174 lb (78.9 kg)   LMP 02/14/1995   SpO2 96%   BMI 32.88 kg/m   Physical Exam:  stable appearing NAD HEENT: Unremarkable Neck:  No JVD, no thyromegally Lymphatics:  No adenopathy Back:  No CVA tenderness Lungs:  Clear with no wheezes HEART:  Regular rate rhythm, no murmurs, no rubs, no clicks Abd:  soft, positive bowel sounds, no organomegally, no rebound, no guarding Ext:  2 plus pulses, no edema, no cyanosis, no  clubbing Skin:  No rashes no nodules Neuro:  CN II through XII intact, motor grossly intact  EKG - NSR with ventricular pacing  DEVICE  Normal device function.  See PaceArt for details.   Assess/Plan: Chronic systolic/diastolic heart failure - I asked her to take an extra torsemide and potassium tomorrow after lunch.  ICD - her device has reached ERI. She will undego ICD gen change out in the coming weeks. Obesity - she continues to slowly lose weight. VT - she has not had more VT since before the holidays. Continue amiodarone.  Sharlot Gowda Sudiksha Victor,MD

## 2023-11-01 NOTE — Patient Instructions (Addendum)
 Medication Instructions:  Your physician recommends that you continue on your current medications as directed. Please refer to the Current Medication list given to you today.  *If you need a refill on your cardiac medications before your next appointment, please call your pharmacy*  Lab Work: None ordered.  You may go to any [facility] Location for your lab work:  [facility] - [address] ([facility]) - [address] [phone] [address]  [place] - [address]   [place]  - [address]   [place] - [address] - [address] [employer]  - [address] [place] - [address] ([facility]   If you have labs (blood work) drawn today and your tests are completely normal, you will receive your results only by: [employer] (if you have MyChart)  If you have any lab test that is abnormal or we need to change your treatment, we will call you or send a MyChart message to review the results.  Testing/Procedures: None ordered.  Follow-Up: At [facility], you and your health needs are our priority.  As part of our continuing mission to provide you with exceptional heart care, we have created designated Provider Care Teams.  These Care Teams include your primary Cardiologist (physician) and Advanced Practice Providers (APPs -  Physician Assistants and Nurse Practitioners) who all work together to provide you with the care you need, when you need it.  Your next appointment:   1 year(s)  The format for your next appointment:   In Person  Provider:   Lewayne Bunting, MD{or one of the following Advanced Practice Providers on your designated Care Team:   Francis Dowse, [place] Casimiro Needle "Mardelle Matte" [place], [place] Earnest Rosier, NP  Note: Remote monitoring is used to monitor your Pacemaker/ ICD from home. This monitoring reduces the number of office visits required to check your  device to one time per year. It allows [place] to keep an eye on the functioning of your device to ensure it is working properly.            Valet parking services will be available as well.

## 2023-11-02 ENCOUNTER — Encounter: Payer: Self-pay | Admitting: Internal Medicine

## 2023-11-02 NOTE — Telephone Encounter (Signed)
 Spoke with Pt about dates and times. Stated I would call back with instructions to move forward.

## 2023-11-11 ENCOUNTER — Telehealth: Payer: Self-pay

## 2023-11-11 NOTE — Telephone Encounter (Signed)
 ICM Call to patient.  Advised automatic report was not received this week to check fluid levels.  Asked if she could sent manual transmission today or Monday, 4/7.  She will try to send one today and if not, she will send next week.

## 2023-11-15 ENCOUNTER — Ambulatory Visit: Attending: Internal Medicine

## 2023-11-15 DIAGNOSIS — I5022 Chronic systolic (congestive) heart failure: Secondary | ICD-10-CM | POA: Diagnosis not present

## 2023-11-15 DIAGNOSIS — Z95810 Presence of automatic (implantable) cardiac defibrillator: Secondary | ICD-10-CM | POA: Diagnosis not present

## 2023-11-16 ENCOUNTER — Other Ambulatory Visit: Payer: Self-pay

## 2023-11-16 DIAGNOSIS — I493 Ventricular premature depolarization: Secondary | ICD-10-CM

## 2023-11-16 DIAGNOSIS — I5022 Chronic systolic (congestive) heart failure: Secondary | ICD-10-CM

## 2023-11-16 DIAGNOSIS — Z01812 Encounter for preprocedural laboratory examination: Secondary | ICD-10-CM

## 2023-11-16 DIAGNOSIS — I472 Ventricular tachycardia, unspecified: Secondary | ICD-10-CM

## 2023-11-16 DIAGNOSIS — I10 Essential (primary) hypertension: Secondary | ICD-10-CM

## 2023-11-16 DIAGNOSIS — I2510 Atherosclerotic heart disease of native coronary artery without angina pectoris: Secondary | ICD-10-CM

## 2023-11-16 DIAGNOSIS — I255 Ischemic cardiomyopathy: Secondary | ICD-10-CM

## 2023-11-16 NOTE — Progress Notes (Signed)
 EPIC Encounter for ICM Monitoring  Patient Name: Lisa [name] is a 83 y.o. female Date: 11/16/2023 Primary Care Physican: Elveria Rising, DO Primary Cardiologist: Tenny Craw Electrophysiologist: Rae Roam Pacing: 98%         11/12/2021 Weight: 248 lbs 01/21/2022 Weight: 246 lbs 09/01/2022 Office Weight: 205 lbs 04/23/2023 Office Weight: 206 lbs 09/02/2023 Weight: 170 lbs 09/14/2023 Weight: 172 lbs   Battery ERI:  ERI reached.  Pt reports battery replacement will be 5/11                                                           Spoke with patient and heart failure questions reviewed.  Transmission results reviewed.  Pt reports slight swelling in the legs.    Diet:  Reviews labels for salt contents and buys low salt foods.     Optivol thoracic impedance suggesting minimal amount possible fluid accumulation starting 3/27    Prescribed:  Torsemide 20 mg take 1 tablet(s) (20 mg total) by mouth daily.   4/8 She reports Dr Ladona Ridgel advised, at 3/24 OV, she can take an extra Torsemide about 1-2 times a week if needed.     Labs: 08/21/2023 Creatinine 2.27, BUN 29, Potassium 4.2, Sodium 135, GFR 21  08/20/2023 Creatinine 2.21, BUN 28, Potassium 3.9, Sodium 137, GFR 22  08/19/2023 Creatinine 2.18, BUN 26, Potassium 3.7, Sodium 134, GFR 22  08/18/2023 Creatinine 2.00, BUN 22, Potassium 4.3, Sodium 135, GFR 25 08/17/2023 Creatinine 1.80, BUN 22, Potassium 3.4, Sodium 136, GFR 28  A complete set of results can be found in Results Review.   Recommendations:  Discussed diet and limiting salt and fluid intake.  She will take an extra Torsemide this week.    Follow-up plan: ICM clinic phone appointment on 12/02/2023 to recheck fluid levels.   91 day device clinic remote transmission 02/03/2024.     EP/Cardiology Office Visits:  None scheduled   Copy of ICM check sent to Dr. Ladona Ridgel.   3 month ICM trend: 11/15/2023.    12-14 Month ICM trend:     Karie Soda, RN 11/16/2023 1:57 PM

## 2023-11-16 NOTE — Telephone Encounter (Signed)
 Spoke with Pt to change dates.

## 2023-11-29 ENCOUNTER — Telehealth: Payer: Self-pay

## 2023-11-29 ENCOUNTER — Other Ambulatory Visit: Payer: Self-pay

## 2023-11-29 DIAGNOSIS — J90 Pleural effusion, not elsewhere classified: Secondary | ICD-10-CM

## 2023-11-29 DIAGNOSIS — I5022 Chronic systolic (congestive) heart failure: Secondary | ICD-10-CM

## 2023-11-29 NOTE — Telephone Encounter (Addendum)
 Dr Powell/ Oncologist [phone] with [facility] called to report that he saw the pt 11/26/23 and her CXR revealed a Pleural Effusion... he is asking that we facilitate her to have a Thoracentesis here in [place] so she would not have to drive to [facility].   Per Dr Avanell Bob... IR Thoracentesis order placed... I spoke with Interventional Radiology at [facility] [phone] and she advised to place the order and once it is in the system... central scheduling will reach out to the pt and get her in for the procedure asap.   Dr Ada Acres advised and was very appreciative.    Per Dr Avanell Bob... pt needs cardiology OV after Thoracentesis for prevention management.  I spoke with pt and she reports she had several recent labs on 11/26/23... I made her an appt to see Dr Avanell Bob 12/20/23.[name] pt declined a sooner appt and denied an APP.   Pt also reports that she is comfortable and fairly asymptomatic and says she does not want this to be urgent.. I advised her that I have placed the order to be done any location we can get it.

## 2023-11-30 ENCOUNTER — Other Ambulatory Visit: Payer: Self-pay | Admitting: Internal Medicine

## 2023-11-30 NOTE — Telephone Encounter (Signed)
 I called the Lisa [name] and sent her a My Chart message... she reports that she has not heard from Radiology about her Thoracentesis ordered yesterday per Dr Avanell Bob.    I called central scheduling for radiology and they made her an appt for 12/02/23 Thursday at 12:30 pm.   Lisa [name] says she is feeling well and her weight is down some.. she was up 6 lbs and now she is down 4 lbs from that.   I reassured her that they will use ultrasound prior to the procedure to verify that there is a pocket they can safely draw the fluid off of just as they did for her in the past and she verbalized understanding.

## 2023-12-01 ENCOUNTER — Other Ambulatory Visit: Payer: Self-pay

## 2023-12-01 ENCOUNTER — Ambulatory Visit (HOSPITAL_BASED_OUTPATIENT_CLINIC_OR_DEPARTMENT_OTHER): Admitting: Physical Therapy

## 2023-12-01 ENCOUNTER — Encounter (HOSPITAL_BASED_OUTPATIENT_CLINIC_OR_DEPARTMENT_OTHER): Payer: Self-pay | Admitting: Physical Therapy

## 2023-12-01 DIAGNOSIS — R262 Difficulty in walking, not elsewhere classified: Secondary | ICD-10-CM | POA: Insufficient documentation

## 2023-12-01 DIAGNOSIS — J948 Other specified pleural conditions: Secondary | ICD-10-CM | POA: Diagnosis not present

## 2023-12-01 DIAGNOSIS — M6281 Muscle weakness (generalized): Secondary | ICD-10-CM | POA: Insufficient documentation

## 2023-12-01 NOTE — Therapy (Signed)
 OUTPATIENT PHYSICAL THERAPY LOWER EXTREMITY EVALUATION   Patient Name: Lisa [name] MRN: [id number] DOB:March 02, 1941, 83 y.o., female Today's Date: 12/02/2023  END OF SESSION: PT End of Session - 12/01/2023    Visit Number 1   Number of Visits 12   Date for PT Re-Evaluation 01/12/2024   Authorization Type MCR A & B   PT Start Time  1111   PT Stop Time 1156   PT Time Calculation (min) 45 min   Activity Tolerance Patient tolerated treatment well   Behavior During Therapy  [facility] for tasks assessed/peformed      Past Medical History:  Diagnosis Date   CAD in native artery 2017   stent to LAD   Cardiac arrest (HCC) 09/2015   CHF (congestive heart failure) (HCC)    CML (chronic myelocytic leukemia) (HCC)    Complication of anesthesia    Hx: UTI (urinary tract infection)    Hypertension    Melanoma (HCC)    PONV (postoperative nausea and vomiting)    PVC (premature ventricular contraction)    HISTORY OF   Thyroid  disease    V tach (HCC)    Past Surgical History:  Procedure Laterality Date   ABDOMINAL HYSTERECTOMY     APPENDECTOMY     CARDIAC CATHETERIZATION N/A 10/03/2015   Procedure: Left Heart Cath and Coronary Angiography;  Surgeon: Peter M Swaziland, MD;  Location: [facility] INVASIVE CV LAB;  Service: Cardiovascular;  Laterality: N/A;   CARDIAC CATHETERIZATION N/A 10/07/2015   Procedure: Coronary Stent Intervention;  Surgeon: Odie Benne, MD;  Location: [facility] INVASIVE CV LAB;  Service: Cardiovascular;  Laterality: N/A;   CARDIAC CATHETERIZATION  09/22/2019   EP IMPLANTABLE DEVICE N/A 01/20/2016   Procedure: BiV ICD Insertion CRT-D;  Surgeon: Tammie Fall, MD;  Location: [facility] INVASIVE CV LAB;  Service: Cardiovascular;  Laterality: N/A;   IR THORACENTESIS ASP PLEURAL SPACE W/IMG GUIDE  07/20/2023   LEFT HEART CATH AND CORONARY ANGIOGRAPHY N/A 09/22/2019   Procedure: LEFT HEART CATH AND CORONARY ANGIOGRAPHY;  Surgeon: Swaziland, Peter M, MD;  Location: [facility] INVASIVE CV LAB;  Service:  Cardiovascular;  Laterality: N/A;   TONSILLECTOMY     TONSILLECTOMY     TUBAL LIGATION     Patient Active Problem List   Diagnosis Date Noted   Obesity, class 1 08/20/2023   Acute hypoxic respiratory failure (HCC) 08/09/2023   Fall at home, initial encounter 08/09/2023   Generalized weakness 08/09/2023   Elevated troponin 08/09/2023   Anemia of chronic disease 08/09/2023   Acute on chronic diastolic heart failure (HCC) 08/08/2023   Cellulitis, leg 07/17/2023   Open wound of right lower extremity 07/17/2023   Respiratory distress 07/17/2023   Recurrent left pleural effusion 07/17/2023   Heart failure with reduced ejection fraction (HCC) 07/17/2023   Chronic kidney disease, stage III (moderate) (HCC) 07/17/2023   Obesity (BMI 30-39.9) 07/17/2023   Urine culture positive 06/19/2022   Transaminitis 06/17/2022   Viral gastroenteritis 06/16/2022   Bacteremia due to Pseudomonas 06/16/2022   Venous stasis dermatitis of both lower extremities 05/26/2022   Acute on chronic systolic heart failure (HCC)    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 05/22/2022   Acute renal failure superimposed on stage 3b chronic kidney disease (HCC) 05/22/2022   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/21/2022   Paroxysmal ventricular tachycardia (HCC) 09/21/2019   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   Unstable angina (HCC) 10/07/2015   Chronic systolic heart failure (HCC)    PVC's (premature  ventricular contractions)    Ventricular fibrillation (HCC) 10/03/2015   Cardiac arrest (HCC) 10/03/2015   Essential hypertension 09/09/2015   Lymphedema 02/21/2015   Acquired hypothyroidism 02/21/2015    PCP: Faustina Hood, MD  REFERRING PROVIDER: Faustina Hood, MD  REFERRING DIAG: R29.898 (ICD-10-CM) - Other symptoms and signs involving the musculoskeletal system / Bilateral leg weakness  THERAPY DIAG:  Muscle weakness (generalized)  Difficulty in walking, not elsewhere  classified  Rationale for Evaluation and Treatment: Rehabilitation  ONSET DATE: December 2024  SUBJECTIVE:   SUBJECTIVE STATEMENT: Pt had a pseudomonas infection in R lower leg and was admitted to the hospital in December.  She returned home but became sick and returned to the hospital in January.  Pt had a L thoracentesis for a left pleural effusion while in the hospital and reports they had to do 3 thoracenteses.  Pt states when she returned home, she was very weak and had much difficulty with mobility.  She was homebound until about 3 weeks ago.  Pt saw her oncologist on 4/18 and had a chest x ray.  MD note indicated a moderate-large pleural effusion on L.  Pt states her oncologist communicated to her cardiologist.  She has an appt tomorrow is probably having a thoracentesis tomorrow.   Pt states she needed assistance with her daily mobility when she first returned home, but has much improved mobility now.  Pt had one visit of home health OT and PT.  She received a HEP and is doing some of those exercises currently.  Pt is able to perform her 3 steps at home now though has difficulty with the last step.  Pt states she still feels weak.  She has difficulty lifting her legs.  She has difficulty lifting legs in her bed and uses a dish towel to lift leg.  Pt requires UE support with transfers.  Pt uses a FWW with ambulation at home.  Pt has decreased ambulation. CNA is coming to home 3x/wk for 4 hours, and is decreasing to 2x/wk next week.   PERTINENT HISTORY: Hx of cardiac arrest in 2017 and has a PACEMAKER and defibrillator -Currently has a L sided pleural effusion and may have a thoracentesis- Chronic heart failure, peripheral edema and lymphedema, HTN leukemia (CML) Pt has had SOB for years. obesity, chronic kidney disease, osteopenia Arthritis in lumbar, sacrum, and bilat hips Hx of R leg melanoma with surgery and skin graft in the 70's   PAIN:  R side lower back 2-3/10  pain  PRECAUTIONS: ICD/Pacemaker and Other: chronic heart failure, CML    WEIGHT BEARING RESTRICTIONS: No  FALLS:  Has patient fallen in last 6 months? Yes. Number of falls 1, fell onto the floor when trying to get out of bed  LIVING ENVIRONMENT: Lives with: lives alone Lives in: 1 story home Stairs: 3 steps with 1 rail Has following equipment at home: rollator, cane, FW walker   PLOF: Independent  Pt has used a cane with ambulation for a couple of years.  Pt has had SOB for years and is limited with standing.  PATIENT GOALS: to get rid of AD, to improve balance, to be able to perform stairs.   OBJECTIVE:  Note: Objective measures were completed at Evaluation unless otherwise noted.  DIAGNOSTIC FINDINGS:  Chest x ray on 4/18:  showed moderate L pleural effusion. Worsening L pleural effusion.   Supportive devices: Left subclavian approach cardiac rhythm device leads in similar position. Cardiovascular: Cardiac silhouette is obscured. Pulmonary vasculatures are  slightly prominent.. Mediastinum: Aortic calcifications. Lungs/pleura: Similar moderate left pleural effusion. Worsening left pleural effusion and left basilar density. No tight effusion. No pneumothoraces. Upper abdomen: Calcified aorta are noted. Chest wall/osseous structures: Thoracic DISH. Osteopenia. Particularly degenerative changes.   IMPRESSION: Worsening moderate to large left effusion and left basilar density.  Slightly prominent pulmonary vasculatures could be due to mild interstitial pulmonary edema.    PATIENT SURVEYS:  LEFS 31/80  COGNITION: Overall cognitive status: Within functional limits for tasks assessed     SENSATION:    EDEMA:  Pt has lymphedema in bilat LE's and has lymphedema wraps on bilat LE's.  VITALS: O2:  97% HR:  75    LOWER EXTREMITY MMT:  MMT Right eval Left eval  Hip flexion <3/5 ; 10.7 <3/5 ; 13.2  Hip extension    Hip abduction 31.9 34.0  Hip adduction    Hip  internal rotation    Hip external rotation    Knee flexion    Knee extension 18.0 24.3  Ankle dorsiflexion    Ankle plantarflexion    Ankle inversion    Ankle eversion     (Blank rows = not tested)   FUNCTIONAL TESTS:  Pt requires UE support with performing sit to stand transfers.  TUG:  13.46 with rollator  GAIT: Comments: pt ambulates with a rollator without any LOB.  She states she feels a little out of breath and her legs feel a little weak.                                                                                                                                TREATMENT:   See below for pt education  PATIENT EDUCATION:  Education details: POC, rationale of interventions, objective findings, dx, relevant anatomy, and what to expect next Rx.  PT answered pt's questions.  Person educated: Patient Education method: Explanation Education comprehension: verbalized understanding  HOME EXERCISE PROGRAM: Will give at a later date  ASSESSMENT:  CLINICAL IMPRESSION: Patient is an 83 y.o. female with a dx of bilat leg weakness.  She has been admitted to hospital twice in December and January and has had 3 thoracentesis.  Pt's chest x ray showed a worsening moderate to large left pleural effusion and is possibly having another thoracentesis tomorrow.  Pt has improved with functional mobility over the past few months since returning home from the hospital.  She continues to have a CNA coming to her home to assist her though the CNA is decreasing frequency next week.  Pt received one visit of home health OT and PT and received a HEP.  Pt c/o's of weakness.  She has difficulty lifting her legs including getting into her bed.  Pt has difficulty with performing transfers and requires UE support to perform sit/stand transfers.  Pt uses a FWW with ambulation at home and is limited with ambulation. She has difficulty with performing the last step to enter her  home.  Pt should benefit from  skilled PT to address impairments and improve overall function.        OBJECTIVE IMPAIRMENTS: Abnormal gait, decreased activity tolerance, decreased balance, decreased endurance, decreased mobility, difficulty walking, decreased strength, and increased edema.   ACTIVITY LIMITATIONS: standing, stairs, transfers, bed mobility, and locomotion level  PARTICIPATION LIMITATIONS: cleaning, shopping, and community activity  PERSONAL FACTORS: Age, Fitness, and 3+ comorbidities: Pacemaker/defib, Chronic heart failure, peripheral edema and lymphedema, CML, pleural effusion, and arthritis  are also affecting patient's functional outcome.   REHAB POTENTIAL: Good  CLINICAL DECISION MAKING: Stable/uncomplicated  EVALUATION COMPLEXITY: Low   GOALS:   SHORT TERM GOALS: Target date:  12/22/2023  Pt will be independent and compliant with HEP for improved strength, tolerance to activity, and functional mobility. Baseline: Goal status: INITIAL  2.  Pt will be able to lift her legs to get in her bed without having to use her hands or a towel. Baseline:  Goal status: INITIAL  3.  Pt will report at leats a 25% improvement in performing functional mobility.  Baseline:  Goal status: INITIAL  4.  Pt will demo at least a 5# increase in bilat hip flex and knee extension strength for improved performance of functional mobility.  Baseline:  Goal status: INITIAL    LONG TERM GOALS: Target date: 01/12/2024  Pt will be able to safely ambulate with cane and report increased ambulation distance with cane.  Baseline:  Goal status: INITIAL  2.  Pt will report she is able to ambulate community distance safely without significant difficulty.  Baseline:  Goal status: INITIAL  3.  *** Baseline:  Goal status: INITIAL  4.  *** Baseline:  Goal status: INITIAL  5.  *** Baseline:  Goal status: INITIAL  6.  *** Baseline:  Goal status: INITIAL   PLAN:  PT FREQUENCY: 2x/week  PT DURATION: 6  weeks  PLANNED INTERVENTIONS: {rehab planned interventions:25118::"97110-Therapeutic exercises","97530- Therapeutic [id number]- Neuromuscular re-education","97535- Self JXBJ","47829- Manual therapy"}  PLAN FOR NEXT SESSION: Marnie Siren, PT 12/02/2023, 8:05 PM

## 2023-12-02 ENCOUNTER — Ambulatory Visit (HOSPITAL_COMMUNITY)
Admission: RE | Admit: 2023-12-02 | Discharge: 2023-12-02 | Disposition: A | Discharge status: Home / Self Care | Source: Ambulatory Visit | Attending: Radiology | Admitting: Radiology

## 2023-12-02 ENCOUNTER — Ambulatory Visit (INDEPENDENT_AMBULATORY_CARE_PROVIDER_SITE_OTHER): Payer: Medicare Other

## 2023-12-02 ENCOUNTER — Ambulatory Visit

## 2023-12-02 ENCOUNTER — Ambulatory Visit (HOSPITAL_COMMUNITY)
Admission: RE | Admit: 2023-12-02 | Discharge: 2023-12-02 | Disposition: A | Discharge status: Home / Self Care | Source: Ambulatory Visit | Attending: Internal Medicine | Admitting: Internal Medicine

## 2023-12-02 VITALS — BP 157/73

## 2023-12-02 DIAGNOSIS — J90 Pleural effusion, not elsewhere classified: Secondary | ICD-10-CM | POA: Insufficient documentation

## 2023-12-02 DIAGNOSIS — Z95810 Presence of automatic (implantable) cardiac defibrillator: Secondary | ICD-10-CM

## 2023-12-02 DIAGNOSIS — I255 Ischemic cardiomyopathy: Secondary | ICD-10-CM | POA: Diagnosis not present

## 2023-12-02 DIAGNOSIS — I5022 Chronic systolic (congestive) heart failure: Secondary | ICD-10-CM

## 2023-12-02 HISTORY — PX: IR THORACENTESIS ASP PLEURAL SPACE W/IMG GUIDE: IMG5380

## 2023-12-02 LAB — CUP PACEART REMOTE DEVICE CHECK
Battery Remaining Longevity: 1 mo — CL
Battery Voltage: 2.68 V
Brady Statistic AP VP Percent: 0.04 %
Brady Statistic AP VS Percent: 0.01 %
Brady Statistic AS VP Percent: 98.48 %
Brady Statistic AS VS Percent: 1.47 %
Brady Statistic RA Percent Paced: 0.05 %
Brady Statistic RV Percent Paced: 2.03 %
Date Time Interrogation Session: 20250424033624
HighPow Impedance: 37 Ohm
Implantable Lead Connection Status: 753985
Implantable Lead Connection Status: 753985
Implantable Lead Connection Status: 753985
Implantable Lead Implant Date: 20170612
Implantable Lead Implant Date: 20170612
Implantable Lead Implant Date: 20170612
Implantable Lead Location: 753858
Implantable Lead Location: 753859
Implantable Lead Location: 753860
Implantable Lead Model: 4398
Implantable Lead Model: 5076
Implantable Pulse Generator Implant Date: 20170612
Lead Channel Impedance Value: 132.321
Lead Channel Impedance Value: 146.656
Lead Channel Impedance Value: 152.559
Lead Channel Impedance Value: 159.265
Lead Channel Impedance Value: 166.25 Ohm
Lead Channel Impedance Value: 247 Ohm
Lead Channel Impedance Value: 247 Ohm
Lead Channel Impedance Value: 285 Ohm
Lead Channel Impedance Value: 285 Ohm
Lead Channel Impedance Value: 361 Ohm
Lead Channel Impedance Value: 361 Ohm
Lead Channel Impedance Value: 399 Ohm
Lead Channel Impedance Value: 475 Ohm
Lead Channel Impedance Value: 513 Ohm
Lead Channel Impedance Value: 551 Ohm
Lead Channel Impedance Value: 551 Ohm
Lead Channel Impedance Value: 551 Ohm
Lead Channel Impedance Value: 589 Ohm
Lead Channel Pacing Threshold Amplitude: 0.75 V
Lead Channel Pacing Threshold Amplitude: 1.125 V
Lead Channel Pacing Threshold Amplitude: 1.375 V
Lead Channel Pacing Threshold Pulse Width: 0.4 ms
Lead Channel Pacing Threshold Pulse Width: 0.4 ms
Lead Channel Pacing Threshold Pulse Width: 0.8 ms
Lead Channel Sensing Intrinsic Amplitude: 2 mV
Lead Channel Sensing Intrinsic Amplitude: 2 mV
Lead Channel Sensing Intrinsic Amplitude: 5.25 mV
Lead Channel Sensing Intrinsic Amplitude: 5.25 mV
Lead Channel Setting Pacing Amplitude: 1.75 V
Lead Channel Setting Pacing Amplitude: 2 V
Lead Channel Setting Pacing Amplitude: 2.5 V
Lead Channel Setting Pacing Pulse Width: 0.4 ms
Lead Channel Setting Pacing Pulse Width: 0.8 ms
Lead Channel Setting Sensing Sensitivity: 0.3 mV
Zone Setting Status: 755011
Zone Setting Status: 755011

## 2023-12-02 MED ORDER — LIDOCAINE HCL 1 % IJ SOLN: 20.0000 mL | Freq: Once | INTRAMUSCULAR | Status: DC

## 2023-12-02 NOTE — Telephone Encounter (Signed)
 Patient is requesting to speak with a nurse in regard to her appointment. Patient stated she is feeling pain in her side. Patient stated she is not feeling well and is throwing up. Please advise.

## 2023-12-02 NOTE — Progress Notes (Signed)
 EPIC Encounter for ICM Monitoring  Patient Name: Lisa [name] is a 83 y.o. female Date: 12/02/2023 Primary Care Physican: [name], DO Primary Cardiologist: Avanell Bob Electrophysiologist: Arvid Latino Pacing: 98.2%         11/12/2021 Weight: 248 lbs 01/21/2022 Weight: 246 lbs 09/01/2022 Office Weight: 205 lbs 04/23/2023 Office Weight: 206 lbs 09/02/2023 Weight: 170 lbs 09/14/2023 Weight: 172 lbs   Battery ERI: battery replacement scheduled 5/30                                                           Transmission results reviewed.  Thoracentesis scheduled 4/24 for pleural effusion per epic note.   Diet:  Reviews labels for salt contents and buys low salt foods.     Optivol thoracic impedance suggesting minimal amount possible fluid accumulation starting 3/27 and returned close to baseline    Prescribed:  Torsemide  20 mg take 1 tablet(s) (20 mg total) by mouth daily.   4/8 She reports Dr Carolynne Citron advised, at 3/24 OV, she can take an extra Torsemide  about 1-2 times a week if needed.     Labs: 08/21/2023 Creatinine 2.27, BUN 29, Potassium 4.2, Sodium 135, GFR 21  08/20/2023 Creatinine 2.21, BUN 28, Potassium 3.9, Sodium 137, GFR 22  08/19/2023 Creatinine 2.18, BUN 26, Potassium 3.7, Sodium 134, GFR 22  08/18/2023 Creatinine 2.00, BUN 22, Potassium 4.3, Sodium 135, GFR 25 08/17/2023 Creatinine 1.80, BUN 22, Potassium 3.4, Sodium 136, GFR 28  A complete set of results can be found in Results Review.   Recommendations:  No changes.   Follow-up plan: ICM clinic phone appointment on 12/16/2023 (manual).   91 day device clinic remote transmission 02/03/2024.     EP/Cardiology Office Visits:  12/20/2023 with Dr Avanell Bob.   Copy of ICM check sent to Dr. Carolynne Citron.    3 month ICM trend: 12/02/2023.    12-14 Month ICM trend:     Almyra Jain, RN 12/02/2023 7:18 AM

## 2023-12-02 NOTE — Procedures (Addendum)
 PROCEDURE SUMMARY:  Successful image-guided therapeutic left thoracentesis. Yielded 1.45 liters of yellow fluid. Patient tolerated procedure well. EBL: Trace  Specimen not sent for labs. Post procedure CXR shows Lucency in the left base. Correlate with underexpanded lung.) (Pneumothorax ex vacuo).   Patient made aware of CXR findings.  Case reviewed with Dr. Marne Sings in IR. Believes these findings are due to a stuck lung and incomplete expansion. Does recommend patient not receive future thoracentesis as she cannot tolerate from today's findings - pt's care team made aware.  Pt remains feeling well, Vitals 98% on RA, HR 70, BP 157/73.  Please see imaging section of Epic for full dictation.  Damian Duke Jailyn Langhorst PA-C 12/02/2023 1:53 PM

## 2023-12-02 NOTE — Telephone Encounter (Signed)
 Spoke with pt regarding her symptoms. Pt stated she has thrown up a few times last night with the last incidence being at 6 AM this morning. She stated she is still nauseous and that her sinus drainage triggers it. Pt stated she does not think she is sick from something she ate. Pt is also experiencing pain in her lower back that wraps around to her hip that started 4/22. Pt stated the pain is not what is making her nauseous. Pt was advised to follow up with her PCP regarding the pain she is having. Pt mentioned she has Zofran  she can take to help with the nausea. Pt was advised to try to make it to her appointment and to wear a mask to be safe. Pt verbalized understanding. All questions if any were answered.

## 2023-12-03 ENCOUNTER — Other Ambulatory Visit: Payer: Self-pay | Admitting: Internal Medicine

## 2023-12-03 NOTE — Therapy (Incomplete)
 OUTPATIENT PHYSICAL THERAPY LOWER EXTREMITY EVALUATION   Patient Name: Lisa [name] MRN: [id number] DOB:Mar 05, 1941, 83 y.o., female Today's Date: 12/02/2023  END OF SESSION: PT End of Session - 12/01/2023    Visit Number 1   Number of Visits 12   Date for PT Re-Evaluation 01/12/2024   Authorization Type MCR A & B   PT Start Time  1111   PT Stop Time 1156   PT Time Calculation (min) 45 min   Activity Tolerance Patient tolerated treatment well   Behavior During Therapy  WFL for tasks assessed/peformed      Past Medical History:  Diagnosis Date  . CAD in native artery 2017   stent to LAD  . Cardiac arrest (HCC) 09/2015  . CHF (congestive heart failure) (HCC)   . CML (chronic myelocytic leukemia) (HCC)   . Complication of anesthesia   . Hx: UTI (urinary tract infection)   . Hypertension   . Melanoma (HCC)   . PONV (postoperative nausea and vomiting)   . PVC (premature ventricular contraction)    HISTORY OF  . Thyroid  disease   . V tach [facility])    Past Surgical History:  Procedure Laterality Date  . ABDOMINAL HYSTERECTOMY    . APPENDECTOMY    . CARDIAC CATHETERIZATION N/A 10/03/2015   Procedure: Left Heart Cath and Coronary Angiography;  Surgeon: Peter M Swaziland, MD;  Location: [facility] INVASIVE CV LAB;  Service: Cardiovascular;  Laterality: N/A;  . CARDIAC CATHETERIZATION N/A 10/07/2015   Procedure: Coronary Stent Intervention;  Surgeon: Odie Benne, MD;  Location: [facility] INVASIVE CV LAB;  Service: Cardiovascular;  Laterality: N/A;  . CARDIAC CATHETERIZATION  09/22/2019  . EP IMPLANTABLE DEVICE N/A 01/20/2016   Procedure: BiV ICD Insertion CRT-D;  Surgeon: Tammie Fall, MD;  Location: [facility] INVASIVE CV LAB;  Service: Cardiovascular;  Laterality: N/A;  . IR THORACENTESIS ASP PLEURAL SPACE W/IMG GUIDE  07/20/2023  . LEFT HEART CATH AND CORONARY ANGIOGRAPHY N/A 09/22/2019   Procedure: LEFT HEART CATH AND CORONARY ANGIOGRAPHY;  Surgeon: Swaziland, Peter M, MD;  Location: [facility]  INVASIVE CV LAB;  Service: Cardiovascular;  Laterality: N/A;  . TONSILLECTOMY    . TONSILLECTOMY    . TUBAL LIGATION     Patient Active Problem List   Diagnosis Date Noted  . Obesity, class 1 08/20/2023  . Acute hypoxic respiratory failure (HCC) 08/09/2023  . Fall at home, initial encounter 08/09/2023  . Generalized weakness 08/09/2023  . Elevated troponin 08/09/2023  . Anemia of chronic disease 08/09/2023  . Acute on chronic diastolic heart failure (HCC) 08/08/2023  . Cellulitis, leg 07/17/2023  . Open wound of right lower extremity 07/17/2023  . Respiratory distress 07/17/2023  . Recurrent left pleural effusion 07/17/2023  . Heart failure with reduced ejection fraction (HCC) 07/17/2023  . Chronic kidney disease, stage III (moderate) (HCC) 07/17/2023  . Obesity (BMI 30-39.9) 07/17/2023  . Urine culture positive 06/19/2022  . Transaminitis 06/17/2022  . Viral gastroenteritis 06/16/2022  . Bacteremia due to Pseudomonas 06/16/2022  . Venous stasis dermatitis of both lower extremities 05/26/2022  . Acute on chronic systolic heart failure (HCC)   . 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 05/22/2022  . Acute renal failure superimposed on stage 3b chronic kidney disease (HCC) 05/22/2022  . 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/21/2022  . Paroxysmal ventricular tachycardia (HCC) 09/21/2019  . 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 Unstable angina (HCC) 10/07/2015  . Chronic systolic heart failure (HCC)   . PVC's (premature  ventricular contractions)   . Ventricular fibrillation (HCC) 10/03/2015  . Cardiac arrest (HCC) 10/03/2015  . Essential hypertension 09/09/2015  . Lymphedema 02/21/2015  . Acquired hypothyroidism 02/21/2015    PCP: Faustina Hood, MD  REFERRING PROVIDER: Faustina Hood, MD  REFERRING DIAG: [phone] (ICD-10-CM) - Other symptoms and signs involving the musculoskeletal system / Bilateral leg weakness  THERAPY DIAG:  Muscle  weakness (generalized)  Difficulty in walking, not elsewhere classified  Rationale for Evaluation and Treatment: Rehabilitation  ONSET DATE: December 2024  SUBJECTIVE:   SUBJECTIVE STATEMENT: Pt had a pseudomonas infection in R lower leg and was admitted to the hospital in December.  She returned home but became sick and returned to the hospital in January.  Pt had a L thoracentesis for a left pleural effusion while in the hospital and reports they had to do 3 thoracenteses.  Pt states when she returned home, she was very weak and had much difficulty with mobility.  She was homebound until about 3 weeks ago.  Pt saw her oncologist on 4/18 and had a chest x ray.  MD note indicated a moderate-large pleural effusion on L.  Pt states her oncologist communicated to her cardiologist.  She has an appt tomorrow is probably having a thoracentesis tomorrow.   Pt states she needed assistance with her daily mobility when she first returned home, but has much improved mobility now.  Pt had one visit of home health OT and PT.  She received a HEP and is doing some of those exercises currently.  Pt is able to perform her 3 steps at home now though has difficulty with the last step.  Pt states she still feels weak.  She has difficulty lifting her legs.  She has difficulty lifting legs in her bed and uses a dish towel to lift leg.  Pt requires UE support with transfers.  Pt uses a FWW with ambulation at home.  Pt has decreased ambulation. CNA is coming to home 3x/wk for 4 hours, and is decreasing to 2x/wk next week.   PERTINENT HISTORY: Hx of cardiac arrest in 2017 and has a PACEMAKER and defibrillator -Currently has a L sided pleural effusion and may have a thoracentesis- Chronic heart failure, peripheral edema and lymphedema, HTN leukemia (CML) Pt has had SOB for years. obesity, chronic kidney disease, osteopenia Arthritis in lumbar, sacrum, and bilat hips Hx of R leg melanoma with surgery and skin graft in  the 70's   PAIN:  R side lower back 2-3/10 pain  PRECAUTIONS: ICD/Pacemaker and Other: chronic heart failure, CML    WEIGHT BEARING RESTRICTIONS: No  FALLS:  Has patient fallen in last 6 months? Yes. Number of falls 1, fell onto the floor when trying to get out of bed  LIVING ENVIRONMENT: Lives with: lives alone Lives in: 1 story home Stairs: 3 steps with 1 rail Has following equipment at home: rollator, cane, FW walker   PLOF: Independent  Pt has used a cane with ambulation for a couple of years.  Pt has had SOB for years and is limited with standing.  PATIENT GOALS: to get rid of AD, to improve balance, to be able to perform stairs.   OBJECTIVE:  Note: Objective measures were completed at Evaluation unless otherwise noted.  DIAGNOSTIC FINDINGS:  Chest x ray on 4/18:  showed moderate L pleural effusion. Worsening L pleural effusion.   Supportive devices: Left subclavian approach cardiac rhythm device leads in similar position. Cardiovascular: Cardiac silhouette is obscured. Pulmonary vasculatures are  slightly prominent.. Mediastinum: Aortic calcifications. Lungs/pleura: Similar moderate left pleural effusion. Worsening left pleural effusion and left basilar density. No tight effusion. No pneumothoraces. Upper abdomen: Calcified aorta are noted. Chest wall/osseous structures: Thoracic DISH. Osteopenia. Particularly degenerative changes.   IMPRESSION: Worsening moderate to large left effusion and left basilar density.  Slightly prominent pulmonary vasculatures could be due to mild interstitial pulmonary edema.    PATIENT SURVEYS:  LEFS 31/80  COGNITION: Overall cognitive status: Within functional limits for tasks assessed     SENSATION:    EDEMA:  Pt has lymphedema in bilat LE's and has lymphedema wraps on bilat LE's.  VITALS: O2:  97% HR:  75    LOWER EXTREMITY MMT:  MMT Right eval Left eval  Hip flexion <3/5 ; 10.7 <3/5 ; 13.2  Hip extension     Hip abduction 31.9 34.0  Hip adduction    Hip internal rotation    Hip external rotation    Knee flexion    Knee extension 18.0 24.3  Ankle dorsiflexion    Ankle plantarflexion    Ankle inversion    Ankle eversion     (Blank rows = not tested)   FUNCTIONAL TESTS:  Pt requires UE support with performing sit to stand transfers.  TUG:  13.46 with rollator  GAIT: Comments: pt ambulates with a rollator without any LOB.  She states she feels a little out of breath and her legs feel a little weak.                                                                                                                                TREATMENT:   See below for pt education  PATIENT EDUCATION:  Education details: POC, rationale of interventions, objective findings, dx, relevant anatomy, and what to expect next Rx.  PT answered pt's questions.  Person educated: Patient Education method: Explanation Education comprehension: verbalized understanding  HOME EXERCISE PROGRAM: Will give at a later date  ASSESSMENT:  CLINICAL IMPRESSION: Patient is an 83 y.o. female with a dx of bilat leg weakness.  She has been admitted to hospital twice in December and January and has had 3 thoracentesis.  Pt's chest x ray showed a worsening moderate to large left pleural effusion and is possibly having another thoracentesis tomorrow.  Pt has improved with functional mobility over the past few months since returning home from the hospital.  She continues to have a CNA coming to her home to assist her though the CNA is decreasing frequency next week.  Pt received one visit of home health OT and PT and received a HEP.  Pt c/o's of weakness.  She has difficulty lifting her legs including getting into her bed.  Pt has difficulty with performing transfers and requires UE support to perform sit/stand transfers.  Pt uses a FWW with ambulation at home and is limited with ambulation. She has difficulty with performing the last  step to enter  her home.  Pt should benefit from skilled PT to address impairments and improve overall function.        OBJECTIVE IMPAIRMENTS: Abnormal gait, decreased activity tolerance, decreased balance, decreased endurance, decreased mobility, difficulty walking, decreased strength, and increased edema.   ACTIVITY LIMITATIONS: standing, stairs, transfers, bed mobility, and locomotion level  PARTICIPATION LIMITATIONS: cleaning, shopping, and community activity  PERSONAL FACTORS: Age, Fitness, and 3+ comorbidities: Pacemaker/defib, Chronic heart failure, peripheral edema and lymphedema, CML, pleural effusion, and arthritis  are also affecting patient's functional outcome.   REHAB POTENTIAL: Good  CLINICAL DECISION MAKING: Stable/uncomplicated  EVALUATION COMPLEXITY: Low   GOALS:   SHORT TERM GOALS: Target date:  12/22/2023  Pt will be independent and compliant with HEP for improved strength, tolerance to activity, and functional mobility. Baseline: Goal status: INITIAL  2.  Pt will be able to lift her legs to get in her bed without having to use her hands or a towel. Baseline:  Goal status: INITIAL  3.  Pt will report at leats a 25% improvement in performing functional mobility.  Baseline:  Goal status: INITIAL  4.  Pt will demo at least a 5# increase in bilat hip flex and knee extension strength for improved performance of functional mobility.  Baseline:  Goal status: INITIAL    LONG TERM GOALS: Target date: 01/12/2024  Pt will be able to safely ambulate with cane and report increased ambulation distance with cane.  Baseline:  Goal status: INITIAL  2.  Pt will report she is able to ambulate community distance safely with appropriate AD without significant difficulty.  Baseline:  Goal status: INITIAL  3.  Pt will report she is able to ascend and descend her stairs to enter/exit home without difficulty.   Baseline:  Goal status: INITIAL  4.  Pt will demo at least a  15# improvement in bilat hip flex and knee extension strength for improved performance of functional mobility including ambulation Baseline:  Goal status: INITIAL  5.  *** Baseline:  Goal status: INITIAL  6.  *** Baseline:  Goal status: INITIAL   PLAN:  PT FREQUENCY: 2x/week  PT DURATION: 6 weeks  PLANNED INTERVENTIONS: {rehab planned interventions:25118::"97110-Therapeutic exercises","97530- Therapeutic [phone]- Neuromuscular re-education","97535- Self FAOZ","30865- Manual therapy"}  PLAN FOR NEXT SESSION: Marnie Siren, PT 12/02/2023, 8:05 PM

## 2023-12-04 ENCOUNTER — Inpatient Hospital Stay (HOSPITAL_BASED_OUTPATIENT_CLINIC_OR_DEPARTMENT_OTHER)
Admission: EM | Admit: 2023-12-04 | Discharge: 2023-12-16 | DRG: 186 | Disposition: A | Discharge status: Home Health | Source: Ambulatory Visit | Attending: Internal Medicine | Admitting: Internal Medicine

## 2023-12-04 ENCOUNTER — Emergency Department (HOSPITAL_BASED_OUTPATIENT_CLINIC_OR_DEPARTMENT_OTHER)

## 2023-12-04 ENCOUNTER — Other Ambulatory Visit: Payer: Self-pay

## 2023-12-04 ENCOUNTER — Encounter (HOSPITAL_BASED_OUTPATIENT_CLINIC_OR_DEPARTMENT_OTHER): Payer: Self-pay

## 2023-12-04 DIAGNOSIS — I2510 Atherosclerotic heart disease of native coronary artery without angina pectoris: Secondary | ICD-10-CM | POA: Diagnosis present

## 2023-12-04 DIAGNOSIS — J948 Other specified pleural conditions: Secondary | ICD-10-CM | POA: Diagnosis not present

## 2023-12-04 DIAGNOSIS — I10 Essential (primary) hypertension: Secondary | ICD-10-CM | POA: Diagnosis present

## 2023-12-04 DIAGNOSIS — B9620 Unspecified Escherichia coli [E. coli] as the cause of diseases classified elsewhere: Secondary | ICD-10-CM | POA: Diagnosis present

## 2023-12-04 DIAGNOSIS — Z4682 Encounter for fitting and adjustment of non-vascular catheter: Secondary | ICD-10-CM | POA: Diagnosis not present

## 2023-12-04 DIAGNOSIS — N183 Chronic kidney disease, stage 3 unspecified: Secondary | ICD-10-CM | POA: Diagnosis present

## 2023-12-04 DIAGNOSIS — C9210 Chronic myeloid leukemia, BCR/ABL-positive, not having achieved remission: Secondary | ICD-10-CM | POA: Diagnosis present

## 2023-12-04 DIAGNOSIS — Z95811 Presence of heart assist device: Secondary | ICD-10-CM | POA: Diagnosis not present

## 2023-12-04 DIAGNOSIS — I4729 Other ventricular tachycardia: Secondary | ICD-10-CM | POA: Diagnosis not present

## 2023-12-04 DIAGNOSIS — I700 Atherosclerosis of aorta: Secondary | ICD-10-CM | POA: Diagnosis present

## 2023-12-04 DIAGNOSIS — Z9221 Personal history of antineoplastic chemotherapy: Secondary | ICD-10-CM

## 2023-12-04 DIAGNOSIS — Z8249 Family history of ischemic heart disease and other diseases of the circulatory system: Secondary | ICD-10-CM | POA: Diagnosis not present

## 2023-12-04 DIAGNOSIS — Z880 Allergy status to penicillin: Secondary | ICD-10-CM

## 2023-12-04 DIAGNOSIS — Z85820 Personal history of malignant melanoma of skin: Secondary | ICD-10-CM | POA: Diagnosis not present

## 2023-12-04 DIAGNOSIS — I130 Hypertensive heart and chronic kidney disease with heart failure and stage 1 through stage 4 chronic kidney disease, or unspecified chronic kidney disease: Secondary | ICD-10-CM | POA: Diagnosis present

## 2023-12-04 DIAGNOSIS — I890 Lymphedema, not elsewhere classified: Secondary | ICD-10-CM | POA: Diagnosis present

## 2023-12-04 DIAGNOSIS — Z885 Allergy status to narcotic agent status: Secondary | ICD-10-CM

## 2023-12-04 DIAGNOSIS — I5033 Acute on chronic diastolic (congestive) heart failure: Secondary | ICD-10-CM | POA: Diagnosis present

## 2023-12-04 DIAGNOSIS — R5381 Other malaise: Secondary | ICD-10-CM | POA: Diagnosis present

## 2023-12-04 DIAGNOSIS — D638 Anemia in other chronic diseases classified elsewhere: Secondary | ICD-10-CM | POA: Diagnosis present

## 2023-12-04 DIAGNOSIS — I509 Heart failure, unspecified: Secondary | ICD-10-CM

## 2023-12-04 DIAGNOSIS — I4901 Ventricular fibrillation: Secondary | ICD-10-CM | POA: Diagnosis not present

## 2023-12-04 DIAGNOSIS — N1832 Chronic kidney disease, stage 3b: Secondary | ICD-10-CM | POA: Diagnosis present

## 2023-12-04 DIAGNOSIS — Z8349 Family history of other endocrine, nutritional and metabolic diseases: Secondary | ICD-10-CM

## 2023-12-04 DIAGNOSIS — E785 Hyperlipidemia, unspecified: Secondary | ICD-10-CM | POA: Diagnosis present

## 2023-12-04 DIAGNOSIS — I480 Paroxysmal atrial fibrillation: Secondary | ICD-10-CM | POA: Diagnosis present

## 2023-12-04 DIAGNOSIS — Z8674 Personal history of sudden cardiac arrest: Secondary | ICD-10-CM

## 2023-12-04 DIAGNOSIS — I5022 Chronic systolic (congestive) heart failure: Secondary | ICD-10-CM | POA: Diagnosis not present

## 2023-12-04 DIAGNOSIS — I472 Ventricular tachycardia, unspecified: Secondary | ICD-10-CM | POA: Diagnosis not present

## 2023-12-04 DIAGNOSIS — E66811 Obesity, class 1: Secondary | ICD-10-CM | POA: Diagnosis present

## 2023-12-04 DIAGNOSIS — N390 Urinary tract infection, site not specified: Secondary | ICD-10-CM | POA: Diagnosis not present

## 2023-12-04 DIAGNOSIS — Z807 Family history of other malignant neoplasms of lymphoid, hematopoietic and related tissues: Secondary | ICD-10-CM

## 2023-12-04 DIAGNOSIS — J9383 Other pneumothorax: Secondary | ICD-10-CM | POA: Diagnosis not present

## 2023-12-04 DIAGNOSIS — Z95810 Presence of automatic (implantable) cardiac defibrillator: Secondary | ICD-10-CM

## 2023-12-04 DIAGNOSIS — Z79899 Other long term (current) drug therapy: Secondary | ICD-10-CM

## 2023-12-04 DIAGNOSIS — Z7982 Long term (current) use of aspirin: Secondary | ICD-10-CM

## 2023-12-04 DIAGNOSIS — Z713 Dietary counseling and surveillance: Secondary | ICD-10-CM

## 2023-12-04 DIAGNOSIS — I5042 Chronic combined systolic (congestive) and diastolic (congestive) heart failure: Secondary | ICD-10-CM | POA: Diagnosis present

## 2023-12-04 DIAGNOSIS — N3000 Acute cystitis without hematuria: Secondary | ICD-10-CM | POA: Diagnosis present

## 2023-12-04 DIAGNOSIS — N179 Acute kidney failure, unspecified: Secondary | ICD-10-CM | POA: Diagnosis present

## 2023-12-04 DIAGNOSIS — Z881 Allergy status to other antibiotic agents status: Secondary | ICD-10-CM

## 2023-12-04 DIAGNOSIS — E039 Hypothyroidism, unspecified: Secondary | ICD-10-CM | POA: Diagnosis present

## 2023-12-04 DIAGNOSIS — J90 Pleural effusion, not elsewhere classified: Secondary | ICD-10-CM | POA: Diagnosis not present

## 2023-12-04 DIAGNOSIS — Z882 Allergy status to sulfonamides status: Secondary | ICD-10-CM

## 2023-12-04 DIAGNOSIS — Z91048 Other nonmedicinal substance allergy status: Secondary | ICD-10-CM

## 2023-12-04 DIAGNOSIS — Z955 Presence of coronary angioplasty implant and graft: Secondary | ICD-10-CM

## 2023-12-04 DIAGNOSIS — Z79890 Hormone replacement therapy (postmenopausal): Secondary | ICD-10-CM

## 2023-12-04 DIAGNOSIS — J9819 Other pulmonary collapse: Secondary | ICD-10-CM

## 2023-12-04 DIAGNOSIS — Z888 Allergy status to other drugs, medicaments and biological substances status: Secondary | ICD-10-CM

## 2023-12-04 DIAGNOSIS — J9601 Acute respiratory failure with hypoxia: Secondary | ICD-10-CM | POA: Diagnosis not present

## 2023-12-04 HISTORY — DX: Acute on chronic systolic (congestive) heart failure: I50.23

## 2023-12-04 LAB — CBC WITH DIFFERENTIAL/PLATELET
Abs Immature Granulocytes: 0.02 10*3/uL (ref 0.00–0.07)
Basophils Absolute: 0 10*3/uL (ref 0.0–0.1)
Basophils Relative: 0 %
Eosinophils Absolute: 0 10*3/uL (ref 0.0–0.5)
Eosinophils Relative: 0 %
HCT: 27.1 % — ABNORMAL LOW (ref 36.0–46.0)
Hemoglobin: 8.9 g/dL — ABNORMAL LOW (ref 12.0–15.0)
Immature Granulocytes: 0 %
Lymphocytes Relative: 17 %
Lymphs Abs: 0.8 10*3/uL (ref 0.7–4.0)
MCH: 31.8 pg (ref 26.0–34.0)
MCHC: 32.8 g/dL (ref 30.0–36.0)
MCV: 96.8 fL (ref 80.0–100.0)
Monocytes Absolute: 0.2 10*3/uL (ref 0.1–1.0)
Monocytes Relative: 5 %
Neutro Abs: 3.6 10*3/uL (ref 1.7–7.7)
Neutrophils Relative %: 78 %
Platelets: 173 10*3/uL (ref 150–400)
RBC: 2.8 MIL/uL — ABNORMAL LOW (ref 3.87–5.11)
RDW: 15.5 % (ref 11.5–15.5)
WBC: 4.7 10*3/uL (ref 4.0–10.5)
nRBC: 0 % (ref 0.0–0.2)

## 2023-12-04 LAB — TROPONIN I (HIGH SENSITIVITY)
Troponin I (High Sensitivity): 75 ng/L — ABNORMAL HIGH (ref <18)
Troponin I (High Sensitivity): 114 ng/L (ref <18)

## 2023-12-04 LAB — URINALYSIS, ROUTINE W REFLEX MICROSCOPIC
Bilirubin Urine: NEGATIVE
Glucose, UA: NEGATIVE mg/dL
Hgb urine dipstick: NEGATIVE
Ketones, ur: 15 mg/dL — AB
Leukocytes,Ua: NEGATIVE
Nitrite: NEGATIVE
Specific Gravity, Urine: 1.01 (ref 1.005–1.030)
pH: 8 (ref 5.0–8.0)

## 2023-12-04 LAB — CBC
HCT: 30.2 % — ABNORMAL LOW (ref 36.0–46.0)
Hemoglobin: 10.2 g/dL — ABNORMAL LOW (ref 12.0–15.0)
MCH: 32.1 pg (ref 26.0–34.0)
MCHC: 33.8 g/dL (ref 30.0–36.0)
MCV: 95 fL (ref 80.0–100.0)
Platelets: 264 10*3/uL (ref 150–400)
RBC: 3.18 MIL/uL — ABNORMAL LOW (ref 3.87–5.11)
RDW: 15.8 % — ABNORMAL HIGH (ref 11.5–15.5)
WBC: 7 10*3/uL (ref 4.0–10.5)
nRBC: 0 % (ref 0.0–0.2)

## 2023-12-04 LAB — COMPREHENSIVE METABOLIC PANEL WITH GFR
ALT: 24 U/L (ref 0–44)
ALT: 23 U/L (ref 0–44)
AST: 29 U/L (ref 15–41)
AST: 31 U/L (ref 15–41)
Albumin: 3.8 g/dL (ref 3.5–5.0)
Albumin: 3.4 g/dL — ABNORMAL LOW (ref 3.5–5.0)
Alkaline Phosphatase: 78 U/L (ref 38–126)
Alkaline Phosphatase: 58 U/L (ref 38–126)
Anion gap: 13 (ref 5–15)
Anion gap: 14 (ref 5–15)
BUN: 17 mg/dL (ref 8–23)
BUN: 13 mg/dL (ref 8–23)
CO2: 23 mmol/L (ref 22–32)
CO2: 21 mmol/L — ABNORMAL LOW (ref 22–32)
Calcium: 9.2 mg/dL (ref 8.9–10.3)
Calcium: 8.6 mg/dL — ABNORMAL LOW (ref 8.9–10.3)
Chloride: 102 mmol/L (ref 98–111)
Chloride: 102 mmol/L (ref 98–111)
Creatinine, Ser: 1.75 mg/dL — ABNORMAL HIGH (ref 0.44–1.00)
Creatinine, Ser: 1.55 mg/dL — ABNORMAL HIGH (ref 0.44–1.00)
GFR, Estimated: 29 mL/min — ABNORMAL LOW (ref >60)
GFR, Estimated: 33 mL/min — ABNORMAL LOW (ref >60)
Glucose, Bld: 115 mg/dL — ABNORMAL HIGH (ref 70–99)
Glucose, Bld: 133 mg/dL — ABNORMAL HIGH (ref 70–99)
Potassium: 4.4 mmol/L (ref 3.5–5.1)
Potassium: 3.3 mmol/L — ABNORMAL LOW (ref 3.5–5.1)
Sodium: 138 mmol/L (ref 135–145)
Sodium: 137 mmol/L (ref 135–145)
Total Bilirubin: 0.9 mg/dL (ref 0.0–1.2)
Total Bilirubin: 1.2 mg/dL (ref 0.0–1.2)
Total Protein: 6.3 g/dL — ABNORMAL LOW (ref 6.5–8.1)
Total Protein: 6.4 g/dL — ABNORMAL LOW (ref 6.5–8.1)

## 2023-12-04 LAB — PRO BRAIN NATRIURETIC PEPTIDE: Pro Brain Natriuretic Peptide: 4071 pg/mL — ABNORMAL HIGH (ref <300.0)

## 2023-12-04 LAB — TSH: TSH: 2.817 u[IU]/mL (ref 0.350–4.500)

## 2023-12-04 LAB — MRSA NEXT GEN BY PCR, NASAL: MRSA by PCR Next Gen: NOT DETECTED

## 2023-12-04 LAB — MAGNESIUM: Magnesium: 1.7 mg/dL (ref 1.7–2.4)

## 2023-12-04 MED ORDER — HEPARIN SODIUM (PORCINE) 5000 UNIT/ML IJ SOLN
5000.0000 [IU] | Freq: Three times a day (TID) | INTRAMUSCULAR | Status: DC
  Administered 2023-12-04 – 2023-12-09 (×13): 5000 [IU] via SUBCUTANEOUS
  Filled 2023-12-04 (×15): qty 1

## 2023-12-04 MED ORDER — ONDANSETRON HCL 4 MG/2ML IJ SOLN
4.0000 mg | Freq: Once | INTRAMUSCULAR | Status: AC
  Administered 2023-12-04: 4 mg via INTRAVENOUS
  Filled 2023-12-04: qty 2

## 2023-12-04 MED ORDER — ONDANSETRON HCL 4 MG/2ML IJ SOLN
4.0000 mg | Freq: Four times a day (QID) | INTRAMUSCULAR | Status: DC | PRN
  Administered 2023-12-05 (×2): 4 mg via INTRAVENOUS
  Filled 2023-12-04 (×3): qty 2

## 2023-12-04 MED ORDER — ONDANSETRON HCL 4 MG/2ML IJ SOLN
INTRAMUSCULAR | Status: AC
  Filled 2023-12-04: qty 2

## 2023-12-04 MED ORDER — THYROID 30 MG PO TABS
15.0000 mg | ORAL_TABLET | Freq: Every day | ORAL | Status: DC
  Administered 2023-12-05 – 2023-12-16 (×12): 15 mg via ORAL
  Filled 2023-12-04 (×12): qty 1

## 2023-12-04 MED ORDER — POLYETHYLENE GLYCOL 3350 17 G PO PACK
17.0000 g | PACK | Freq: Every day | ORAL | Status: DC | PRN
  Administered 2023-12-07: 17 g via ORAL
  Filled 2023-12-04: qty 1

## 2023-12-04 MED ORDER — AMIODARONE HCL IN DEXTROSE 360-4.14 MG/200ML-% IV SOLN
30.0000 mg/h | INTRAVENOUS | Status: AC
Start: 2023-12-05 — End: 2023-12-06
  Administered 2023-12-05 (×2): 30 mg/h via INTRAVENOUS
  Filled 2023-12-04 (×2): qty 200

## 2023-12-04 MED ORDER — AMIODARONE LOAD VIA INFUSION
150.0000 mg | Freq: Once | INTRAVENOUS | Status: AC
  Administered 2023-12-04: 150 mg via INTRAVENOUS

## 2023-12-04 MED ORDER — ALUM & MAG HYDROXIDE-SIMETH 200-200-20 MG/5ML PO SUSP: 30.0000 mL | ORAL | Status: DC | PRN

## 2023-12-04 MED ORDER — ORAL CARE MOUTH RINSE: 15.0000 mL | OROMUCOSAL | Status: DC | PRN

## 2023-12-04 MED ORDER — AMIODARONE HCL 200 MG PO TABS: 200.0000 mg | ORAL_TABLET | Freq: Every day | ORAL | Status: DC

## 2023-12-04 MED ORDER — IMATINIB MESYLATE 400 MG PO TABS
400.0000 mg | ORAL_TABLET | Freq: Every day | ORAL | Status: DC
  Administered 2023-12-05 – 2023-12-07 (×3): 400 mg via ORAL
  Filled 2023-12-04 (×7): qty 1

## 2023-12-04 MED ORDER — AMIODARONE HCL IN DEXTROSE 360-4.14 MG/200ML-% IV SOLN
INTRAVENOUS | Status: AC
  Filled 2023-12-04: qty 200

## 2023-12-04 MED ORDER — MAGNESIUM SULFATE 2 GM/50ML IV SOLN
2.0000 g | Freq: Once | INTRAVENOUS | Status: AC
  Administered 2023-12-04: 2 g via INTRAVENOUS
  Filled 2023-12-04: qty 50

## 2023-12-04 MED ORDER — ACETAMINOPHEN 325 MG PO TABS
650.0000 mg | ORAL_TABLET | Freq: Four times a day (QID) | ORAL | Status: DC | PRN
  Administered 2023-12-05: 650 mg via ORAL
  Filled 2023-12-04 (×2): qty 2

## 2023-12-04 MED ORDER — POTASSIUM CHLORIDE 20 MEQ PO PACK
60.0000 meq | PACK | Freq: Once | ORAL | Status: AC
  Administered 2023-12-04: 60 meq via ORAL
  Filled 2023-12-04: qty 3

## 2023-12-04 MED ORDER — FUROSEMIDE 10 MG/ML IJ SOLN
40.0000 mg | Freq: Two times a day (BID) | INTRAMUSCULAR | Status: DC
Start: 2023-12-05 — End: 2023-12-07
  Administered 2023-12-05 – 2023-12-07 (×5): 40 mg via INTRAVENOUS
  Filled 2023-12-04 (×5): qty 4

## 2023-12-04 MED ORDER — LORAZEPAM 2 MG/ML IJ SOLN
1.0000 mg | Freq: Once | INTRAMUSCULAR | Status: AC
  Administered 2023-12-04: 1 mg via INTRAVENOUS

## 2023-12-04 MED ORDER — MORPHINE SULFATE (PF) 4 MG/ML IV SOLN
4.0000 mg | Freq: Once | INTRAVENOUS | Status: AC
  Administered 2023-12-04: 4 mg via INTRAVENOUS
  Filled 2023-12-04: qty 1

## 2023-12-04 MED ORDER — CEPHALEXIN 250 MG PO CAPS
500.0000 mg | ORAL_CAPSULE | Freq: Two times a day (BID) | ORAL | Status: DC
  Administered 2023-12-04 – 2023-12-05 (×2): 500 mg via ORAL
  Filled 2023-12-04 (×3): qty 2

## 2023-12-04 MED ORDER — ATORVASTATIN CALCIUM 10 MG PO TABS
20.0000 mg | ORAL_TABLET | Freq: Every day | ORAL | Status: DC
  Administered 2023-12-05 – 2023-12-16 (×12): 20 mg via ORAL
  Filled 2023-12-04 (×12): qty 2

## 2023-12-04 MED ORDER — ACETAMINOPHEN 650 MG RE SUPP: 650.0000 mg | Freq: Four times a day (QID) | RECTAL | Status: DC | PRN

## 2023-12-04 MED ORDER — LORAZEPAM 2 MG/ML IJ SOLN
INTRAMUSCULAR | Status: AC
Start: 2023-12-04 — End: 2023-12-05
  Filled 2023-12-04: qty 1

## 2023-12-04 MED ORDER — CHLORHEXIDINE GLUCONATE CLOTH 2 % EX PADS
6.0000 | MEDICATED_PAD | Freq: Every day | CUTANEOUS | Status: DC
  Administered 2023-12-04 – 2023-12-15 (×12): 6 via TOPICAL

## 2023-12-04 MED ORDER — ONDANSETRON HCL 4 MG/2ML IJ SOLN
4.0000 mg | Freq: Once | INTRAMUSCULAR | Status: AC
Start: 2023-12-04 — End: 2023-12-04
  Administered 2023-12-04: 4 mg via INTRAVENOUS
  Filled 2023-12-04: qty 2

## 2023-12-04 MED ORDER — FUROSEMIDE 10 MG/ML IJ SOLN
40.0000 mg | Freq: Once | INTRAMUSCULAR | Status: AC
  Administered 2023-12-04: 40 mg via INTRAVENOUS
  Filled 2023-12-04: qty 4

## 2023-12-04 MED ORDER — AMIODARONE HCL IN DEXTROSE 360-4.14 MG/200ML-% IV SOLN
60.0000 mg/h | INTRAVENOUS | Status: AC
  Administered 2023-12-04 (×2): 60 mg/h via INTRAVENOUS

## 2023-12-04 NOTE — ED Provider Notes (Signed)
 [place] [facility]ERGENCY DEPARTMENT AT [facility] Provider Note   CSN: [id number] Arrival date & time: 12/04/23  1026     History Leukemia  Chief Complaint  Patient presents with   Flank Pain         Lisa [name] is a 83 y.o. female.  83 year old female with a past medical history of leukemia, on therapeutic thoracentesis presents to the ED with a chief complaint of right flank pain.  Evaluated at urgent care yesterday, diagnosed with a urinary tract infection, was placed on antibiotics.  Over the last 24 hours her symptoms have worsened, she continues to have severe pain to the right flank radiating into the right lower quadrant.  She has not been able to keep anything down, has had multiple episodes of nonbilious, nonbloody emesis.  She is also endorsing some dysuria.  She did have her thoracentesis completed yesterday where they remove approximately 1500 mL.  She tried taking some Zofran  this morning without much improvement in her symptoms.  Last bowel movement was yesterday.  She denies any fever, cough, or shortness of breath.  The history is provided by the patient.  Flank Pain This is a new problem. The current episode started 12 to 24 hours ago. The problem occurs constantly. The problem has been gradually worsening. Associated symptoms include abdominal pain. Pertinent negatives include no chest pain and no shortness of breath. Nothing relieves the symptoms. She has tried nothing for the symptoms.       Home Medications Prior to Admission medications   Medication Sig Start Date End Date Taking? Authorizing Provider  acetaminophen  (TYLENOL ) 325 MG tablet Take 2 tablets (650 mg total) by mouth every 6 (six) hours as needed for mild pain (pain score 1-3) or moderate pain (pain score 4-6). 08/21/23   Arrien, Mauricio Daniel, MD  allopurinol  (ZYLOPRIM ) 100 MG tablet Take 100 mg by mouth daily.  06/15/17   [provider]  aluminum hydroxide-magnesium  carbonate  (GAVISCON) 95-358 MG/15ML SUSP Take 30 mLs by mouth as needed for indigestion or heartburn.    [provider]  amiodarone  (PACERONE ) 200 MG tablet Take 1 tablet (200 mg total) by mouth daily. Patient taking differently: Take 200 mg by mouth at bedtime. 07/27/23   Tammie Fall, MD  aspirin  EC 81 MG tablet Take 81 mg by mouth in the morning.    [provider]  atorvastatin  (LIPITOR) 20 MG tablet Take 1 tablet (20 mg total) by mouth daily. 05/07/16   Bensimhon, Rheta Celestine, MD  b complex vitamins capsule Take 1 capsule by mouth daily.    [provider]  calcium  carbonate (OS-CAL - DOSED IN MG OF ELEMENTAL CALCIUM ) 1250 (500 Ca) MG tablet Take 1 tablet by mouth daily as needed (Calcium  levels).    [provider]  Cholecalciferol  (VITAMIN D -3 PO) Take 5,000 Units by mouth daily.    [provider]  Cyanocobalamin (VITAMIN B12 SL) Place 1 tablet under the tongue daily.    [provider]  imatinib  (GLEEVEC ) 400 MG tablet Take 400 mg by mouth at bedtime. 02/16/17   [provider]  loperamide  (IMODIUM ) 2 MG capsule Take 2 mg by mouth as needed for diarrhea or loose stools.    [provider]  MAGNESIUM  GLYCINATE PO Take 1 tablet by mouth at bedtime.    [provider]  Multiple Vitamin (MULTIVITAMIN WITH MINERALS) TABS tablet Take 1 tablet by mouth daily.    [provider]  nitroGLYCERIN  (NITROSTAT ) 0.4  MG SL tablet Place 1 tablet (0.4 mg total) under the tongue every 5 (five) minutes x 3 doses as needed for chest pain. 08/21/23   Arrien, Curlee Doss, MD  thyroid  (ARMOUR) 15 MG tablet Take 15 mg by mouth daily before breakfast.     [provider]  timolol  (TIMOPTIC ) 0.5 % ophthalmic solution Place 1 drop into the left eye 2 (two) times daily. 07/15/23   [provider]  torsemide  (DEMADEX ) 20 MG tablet Take 1 tablet (20 mg total) by mouth daily. 09/13/23   Elmyra Haggard, MD  vitamin C  (ASCORBIC  ACID) 500 MG tablet Take 500 mg by mouth daily.    [provider]      Allergies    Flagyl [metronidazole], Penicillins, Sulfamethoxazole, Dilaudid  [hydromorphone  hcl], Meperidine, Morphine and codeine , Propoxyphene, Tape, Doxycycline , Lanolin, and Sulfa antibiotics    Review of Systems   Review of Systems  Constitutional:  Negative for chills and fever.  HENT:  Negative for sore throat.   Respiratory:  Negative for shortness of breath.   Cardiovascular:  Negative for chest pain.  Gastrointestinal:  Positive for abdominal pain, nausea and vomiting.  Genitourinary:  Positive for dysuria and flank pain. Negative for difficulty urinating.  Musculoskeletal:  Negative for back pain.  All other systems reviewed and are negative.   Physical Exam Updated Vital Signs BP (!) 176/86 (BP Location: Right Arm)   Pulse 70   Temp 97.9 F (36.6 C) (Oral)   Resp 15   Ht 5\' 1"  (1.549 m)   Wt 76.7 kg   LMP 02/14/1995   SpO2 100%   BMI 31.93 kg/m  Physical Exam Vitals and nursing note reviewed.  Constitutional:      Appearance: Normal appearance. She is ill-appearing.  HENT:     Head: Normocephalic and atraumatic.     Comments: Bruising to her face.     Mouth/Throat:     Mouth: Mucous membranes are dry.  Eyes:     Pupils: Pupils are equal, round, and reactive to light.  Cardiovascular:     Rate and Rhythm: Normal rate.     Pulses:          Dorsalis pedis pulses are 2+ on the right side and 2+ on the left side.     Comments: Lymphedema  Pulmonary:     Effort: Pulmonary effort is normal.  Abdominal:     General: Abdomen is flat.     Tenderness: There is abdominal tenderness. There is right CVA tenderness and guarding.  Musculoskeletal:     Cervical back: Normal range of motion and neck supple.  Skin:    General: Skin is warm and dry.  Neurological:     Mental Status: She is alert and oriented to person, place, and time.     ED Results / Procedures / Treatments    Labs (all labs ordered are listed, but only abnormal results are displayed) Labs Reviewed  CBC WITH DIFFERENTIAL/PLATELET - Abnormal; Notable for the following components:      Result Value   RBC 2.80 (*)    Hemoglobin 8.9 (*)    HCT 27.1 (*)    All other components within normal limits  COMPREHENSIVE METABOLIC PANEL WITH GFR - Abnormal; Notable for the following components:   Glucose, Bld 115 (*)    Creatinine, Ser 1.75 (*)    Total Protein 6.3 (*)    GFR, Estimated 29 (*)    All other components within normal limits  URINALYSIS,  ROUTINE W REFLEX MICROSCOPIC - Abnormal; Notable for the following components:   Ketones, ur 15 (*)    Protein, ur TRACE (*)    All other components within normal limits  PRO BRAIN NATRIURETIC PEPTIDE - Abnormal; Notable for the following components:   Pro Brain Natriuretic Peptide 4,071.0 (*)    All other components within normal limits  URINE CULTURE    EKG None  Radiology CT Renal Stone Study Result Date: 12/04/2023 CLINICAL DATA:  Abdominal/flank pain, right-sided, history of UTI EXAM: CT ABDOMEN AND PELVIS WITHOUT CONTRAST TECHNIQUE: Multidetector CT imaging of the abdomen and pelvis was performed following the standard protocol without IV contrast. RADIATION DOSE REDUCTION: This exam was performed according to the departmental dose-optimization program which includes automated exposure control, adjustment of the mA and/or kV according to patient size and/or use of iterative reconstruction technique. COMPARISON:  08/08/2023 FINDINGS: Lower chest: Large hydropneumothorax at the left lung base. Transvenous pacing leads. Hepatobiliary: No focal liver abnormality is seen. No gallstones, gallbladder wall thickening, or biliary dilatation. Pancreas: Unremarkable. No pancreatic ductal dilatation or surrounding inflammatory changes. Spleen: Normal in size without focal abnormality. Adrenals/Urinary Tract: No definite adrenal mass. Symmetric renal contours.  Right kidney is ptotic. No urolithiasis or hydronephrosis. Urinary bladder partially distended. Stomach/Bowel: Stomach is nondistended, without acute finding. Small bowel decompressed. Colon unremarkable. Vascular/Lymphatic: Extensive aortoiliac calcified plaque without AAA. No evident abdominal or pelvic adenopathy. Reproductive: Status post hysterectomy. No adnexal masses. Other: Trace free fluid in the pelvis. No abdominal ascites. No free air. Musculoskeletal: Mild spondylitic changes in the mid lumbar spine. Osteitis pubis. IMPRESSION: 1. Large left hydropneumothorax. Critical Value/emergent results were called by telephone at the time of interpretation on 12/04/2023 at 12:24 pm to provider [facility] , who verbally acknowledged these results. 2. No acute findings in the abdomen or pelvis. 3.  Aortic Atherosclerosis (ICD10-I70.0). Electronically Signed   By: Nicoletta Barrier M.D.   On: 12/04/2023 12:25    Procedures Procedures    Medications Ordered in ED Medications  ondansetron  (ZOFRAN ) injection 4 mg (4 mg Intravenous Given 12/04/23 1149)  morphine (PF) 4 MG/ML injection 4 mg (4 mg Intravenous Given 12/04/23 1316)  ondansetron  (ZOFRAN ) injection 4 mg (4 mg Intravenous Given 12/04/23 1315)  furosemide  (LASIX ) injection 40 mg (40 mg Intravenous Given 12/04/23 1422)    ED Course/ Medical Decision Making/ A&P Clinical Course as of 12/04/23 1457  Sat Dec 04, 2023  1237 Pro Brain Natriuretic Peptide(!): 4,071.0 [JS]  1237 Creatinine(!): 1.75 Improved from baseline [JS]    Clinical Course User Index [JS] Taivon Haroon, PA-C                                 Medical Decision Making Amount and/or Complexity of Data Reviewed Labs: ordered. Decision-making details documented in ED Course. Radiology: ordered.  Risk Prescription drug management. Decision regarding hospitalization.   This patient presents to the ED for concern of right flank pain, this involves a number of treatment options, and is  a complaint that carries with it a high risk of complications and morbidity.  The differential diagnosis includes renal colic, pyelonephritis, lower lobe pneumonia versus PE.    Co morbidities: Discussed in HPI   Brief History:  See HPI.   EMR reviewed including pt PMHx, past surgical history and past visits to ER.   See HPI for more details   Lab Tests:  I ordered and independently interpreted labs.  The pertinent results include:    CBC with no concerns, hemoglobin is slightly decreased but within her baseline.  CMP with no electrolyte derangement, creatinine level is slightly improved from prior.  LFTs are within normal limits.  UA is no nitrites, no leukocytes.  BNP 4,071.0 significantly elevated but no prior level for comparison.   Imaging Studies:  1. Large left hydropneumothorax.  Critical Value/emergent results were called by telephone at the time  of interpretation on 12/04/2023 at 12:24 pm to provider [facility] ,  who verbally acknowledged these results.  2. No acute findings in the abdomen or pelvis.  3.  Aortic Atherosclerosis (ICD10-I70.0).   Cardiac Monitoring:  The patient was maintained on a cardiac monitor.  I personally viewed and interpreted the cardiac monitored which showed an underlying rhythm of: NSR EKG non-ischemic  Medicines ordered:  I ordered medication including lasix   for diuresis Reevaluation of the patient after these medicines showed that the patient stayed the same I have reviewed the patients home medicines and have made adjustments as needed  Reevaluation:  After the interventions noted above I re-evaluated patient and found that they have :stayed the same  Social Determinants of Health:  The patient's social determinants of health were a factor in the care of this patient  Problem List / ED Course:  Patient presents to the ED with a chief complaint of right flank pain which began yesterday, evaluated urgent care.  She was  placed on antibiotics in order to help treat her urinary tract infection.  Over the last 24 hours, patient has experienced nausea, vomiting.  She did have a therapeutic thoracentesis yesterday where they remove approximately 1500 mL of fluid.  She has tea sporadically due to increase in fluid.  She has underlying history of lymphedema but no prior history of heart disease.  She is on oral chemotherapy which she reports makes her retain more fluid. She was treated for urinary tract infection at urgent care, according to records which I have reviewed she does have a growth of 60k-100k E. coli on her urine culture.  Now she is unable to tolerate any p.o., she had Zofran  here with some improvement in her symptoms however continues to have pain.  She was given morphine to help with symptoms.  A renal CT was obtained which did not show any nephrolithiasis.  She does have a large left hydropneumothorax, however she did have this drained yesterday therefore suspect likely subacute.  Her BNP is around the 4000's, I do feel that she needs diuresis, therefore provided with 40 mg of IV Lasix , I do feel that she needs admission into the hospital for further management. Call placed.  Spoke to hospitalist service who requested surgery involvement.  I spoke to Dr. Alita Irwin of critical care, who will review CT imaging recommends patient being transferred to [facility], will place chest tube if patient needs it once she is evaluated by critical care. I spoke to Dr. Elsworth Halt who will admit patient for further management.   Dispostion:  After consideration of the diagnostic results and the patients response to treatment, I feel that the patent would benefit from admission for further management.    Portions of this note were generated with Scientist, clinical (histocompatibility and immunogenetics). Dictation errors may occur despite best attempts at proofreading.   Final Clinical Impression(s) / ED Diagnoses Final diagnoses:  Acute cystitis without hematuria   Acute heart failure, unspecified heart failure type [facility])    Rx / DC Orders ED Discharge  Orders     None         Lisa Sages, PA-C 12/04/23 1457    Lisa Army, MD 12/06/23 2210

## 2023-12-04 NOTE — ED Notes (Signed)
 This RN assisted patient to and from bedside commode. Patient able to ambulate with assistance. Reposition in bed for comfort. Bed in lowest position and call light within reach. Denies additional needs.

## 2023-12-04 NOTE — ED Notes (Signed)
 Called [facility] for transport, pt bed assignment is ready

## 2023-12-04 NOTE — Care Plan (Addendum)
 This 83 years old female with PMH significant for leukemia on oral chemotherapy presented in the ED with right flank pain. Patient had thoracocentesis 2 days ago, on exam no CVA tenderness.  CT renal study shows large left hydropneumothorax. EDP discussed the case with PCCM Dr. Alita Irwin who recommended admission under hospitalist. Please consult Dr. Alita Irwin PCCM patient might need chest tube. Patient accepted for admission in progressive unit at [facility].

## 2023-12-04 NOTE — ED Notes (Signed)
 This RN assisted patient to and from bedside commode. Patient able to ambulate with assistance. Repositioned in bed for comfort. Bed in lowest position and call light within reach. Denies additional needs.

## 2023-12-04 NOTE — ED Triage Notes (Addendum)
 Pt reports R sided flank and back pain x4-5 days. Pt reports recent diagnosis of UTI. Pt also recently had a thorencentesis x2 days ago. Pt reports taking 1500 ml off. Pt started abx on Thursday (24APR). PT also endorses N/V.

## 2023-12-04 NOTE — H&P (Addendum)
 Consult Note  [name]:[id number] DOB: 1940/12/30 DOA: 12/04/2023  PCP: [name], DO   Patient coming from: Home  Chief Complaint: Flank pain  HPI: Lisa [name] is a 83 y.o. female with medical history significant of hypertension, hypothyroidism, anemia, V. tach/V-fib, cardiac arrest, CHF, CML, obesity, recurrent pleural effusion, venous stasis, lymphedema presenting with ongoing flank pain.  Patient has been having issues with flank pain for the past several days.  Pain is on her right side.  She seen urgent care a couple days ago and was diagnosed with UTI and urine cultures came back showing 60-100,000 colony-forming units of E. coli.  She was started on Keflex.  She also had her outpatient therapeutic thoracentesis performed with 1.45 L of fluid obtained and residual opacity on follow-up x-ray bleed to be secondary to incomplete reexpansion.  Patient has had continued worsening flank pain for the past 24 hours and came to the ED to be reevaluated.  Pain radiating to right lower quadrant.  Decreased p.o. intake secondary to this and nausea.  Denies fevers, chills, chest pain, constipation, diarrhea.  ED Course: Vital signs in the ED notable for blood pressure in the 140s-170 systolic.  Lab workup included CMP with creatinine stable 1.75, glucose 115, protein 6.3.  CBC with hemoglobin stable at 8.9.  BNP significantly elevated to 4071.  Urinalysis with ketones and protein only.  Urine culture pending.  Chest x-ray showed large pneumothorax and cardiomegaly.  CT renal stones study showed large left hydropneumothorax.  Patient received morphine, Lasix , Zofran  x 2 in the ED.  Pulmonology consulted and will evaluate the patient on arrival for possible chest tube placement.  Review of Systems: As per HPI otherwise all other systems reviewed and are negative.  Past Medical History:  Diagnosis Date   Acute on chronic systolic heart failure (HCC)    Acute renal  failure superimposed on stage 3b chronic kidney disease (HCC) 05/22/2022   Bacteremia due to Pseudomonas 06/16/2022   CAD in native artery 2017   stent to LAD   Cardiac arrest [facility]) 09/2015   CHF (congestive heart failure) (HCC)    CML (chronic myelocytic leukemia) (HCC)    Complication of anesthesia    Hx: UTI (urinary tract infection)    Hypertension    Melanoma (HCC)    PONV (postoperative nausea and vomiting)    PVC (premature ventricular contraction)    HISTORY OF   Thyroid  disease    V tach (HCC)     Past Surgical History:  Procedure Laterality Date   ABDOMINAL HYSTERECTOMY     APPENDECTOMY     CARDIAC CATHETERIZATION N/A 10/03/2015   Procedure: Left Heart Cath and Coronary Angiography;  Surgeon: Peter M Swaziland, MD;  Location: [facility] INVASIVE CV LAB;  Service: Cardiovascular;  Laterality: N/A;   CARDIAC CATHETERIZATION N/A 10/07/2015   Procedure: Coronary Stent Intervention;  Surgeon: Odie Benne, MD;  Location: [facility] INVASIVE CV LAB;  Service: Cardiovascular;  Laterality: N/A;   CARDIAC CATHETERIZATION  09/22/2019   EP IMPLANTABLE DEVICE N/A 01/20/2016   Procedure: BiV ICD Insertion CRT-D;  Surgeon: Tammie Fall, MD;  Location: [facility] INVASIVE CV LAB;  Service: Cardiovascular;  Laterality: N/A;   IR THORACENTESIS ASP PLEURAL SPACE W/IMG GUIDE  07/20/2023   IR THORACENTESIS ASP PLEURAL SPACE W/IMG GUIDE  12/02/2023   LEFT HEART CATH AND CORONARY ANGIOGRAPHY N/A 09/22/2019   Procedure: LEFT HEART CATH AND CORONARY ANGIOGRAPHY;  Surgeon: Swaziland, Peter M, MD;  Location: [facility] INVASIVE  CV LAB;  Service: Cardiovascular;  Laterality: N/A;   TONSILLECTOMY     TONSILLECTOMY     TUBAL LIGATION      Social History  reports that she has never smoked. She has never used smokeless tobacco. She reports that she does not drink alcohol and does not use drugs.  Allergies  Allergen Reactions   Flagyl [Metronidazole] Itching, Rash and Other (See Comments)    Welts, also   Penicillins Hives,  Itching and Rash    Has patient had a PCN reaction causing immediate rash, facial/tongue/throat swelling, SOB or lightheadedness with hypotension: Yes  Has patient had a PCN reaction causing severe rash involving mucus membranes or skin necrosis: Yes Has patient had a PCN reaction that required hospitalization No Has patient had a PCN reaction occurring within the last 10 years: NO If all of the above answers are "NO", then may proceed with Cephalosporin use.    Sulfamethoxazole Rash   Dilaudid  [Hydromorphone  Hcl] Nausea And Vomiting   Meperidine Nausea And Vomiting   Propoxyphene     Other reaction(s): Unknown   Tape Other (See Comments)    "THE PLASTIC, CLEAR TAPE CAN/DOES PULL OFF MY SKIN."   Doxycycline  Nausea Only, Rash and Other (See Comments)    Made her "feel terrible"   Lanolin Itching and Rash   Sulfa Antibiotics Rash    Family History  Problem Relation Age of Onset   Coronary artery disease Mother    Thyroid  disease Mother    Multiple myeloma Father    Heart attack Maternal Grandfather    Heart attack Paternal Grandfather   Reviewed on admission  Prior to Admission medications   Medication Sig Start Date End Date Taking? Authorizing Provider  cephALEXin (KEFLEX) 500 MG capsule Take 500 mg by mouth. 12/02/23 12/07/23 Yes [provider]  acetaminophen  (TYLENOL ) 325 MG tablet Take 2 tablets (650 mg total) by mouth every 6 (six) hours as needed for mild pain (pain score 1-3) or moderate pain (pain score 4-6). 08/21/23   Arrien, Mauricio Daniel, MD  allopurinol  (ZYLOPRIM ) 100 MG tablet Take 100 mg by mouth daily.  06/15/17   [provider]  aluminum hydroxide-magnesium  carbonate (GAVISCON) 95-358 MG/15ML SUSP Take 30 mLs by mouth as needed for indigestion or heartburn.    [provider]  amiodarone  (PACERONE ) 200 MG tablet Take 1 tablet (200 mg total) by mouth daily. Patient taking differently: Take 200 mg by mouth at bedtime. 07/27/23   Tammie Fall, MD  aspirin  EC 81 MG tablet Take 81 mg by mouth in the morning.    [provider]  atorvastatin  (LIPITOR) 20 MG tablet Take 1 tablet (20 mg total) by mouth daily. 05/07/16   Bensimhon, Rheta Celestine, MD  b complex vitamins capsule Take 1 capsule by mouth daily.    [provider]  calcium  carbonate (OS-CAL - DOSED IN MG OF ELEMENTAL CALCIUM ) 1250 (500 Ca) MG tablet Take 1 tablet by mouth daily as needed (Calcium  levels).    [provider]  Cholecalciferol  (VITAMIN D -3 PO) Take 5,000 Units by mouth daily.    [provider]  Cyanocobalamin (VITAMIN B12 SL) Place 1 tablet under the tongue daily.    [provider]  imatinib  (GLEEVEC ) 400 MG tablet Take 400 mg by mouth at bedtime. 02/16/17   [provider]  loperamide  (IMODIUM ) 2 MG capsule Take 2 mg by mouth as needed for diarrhea or loose stools.    [provider]  MAGNESIUM   GLYCINATE PO Take 1 tablet by mouth at bedtime.    [provider]  Multiple Vitamin (MULTIVITAMIN WITH MINERALS) TABS tablet Take 1 tablet by mouth daily.    [provider]  nitroGLYCERIN  (NITROSTAT ) 0.4 MG SL tablet Place 1 tablet (0.4 mg total) under the tongue every 5 (five) minutes x 3 doses as needed for chest pain. 08/21/23   Arrien, Curlee Doss, MD  thyroid  (ARMOUR) 15 MG tablet Take 15 mg by mouth daily before breakfast.     [provider]  timolol  (TIMOPTIC ) 0.5 % ophthalmic solution Place 1 drop into the left eye 2 (two) times daily. 07/15/23   [provider]  torsemide  (DEMADEX ) 20 MG tablet Take 1 tablet (20 mg total) by mouth daily. 09/13/23   Elmyra Haggard, MD  vitamin C  (ASCORBIC ACID ) 500 MG tablet Take 500 mg by mouth daily.    [provider]    Physical Exam: Vitals:   12/04/23 1500 12/04/23 1545 12/04/23 1600 12/04/23 1700  BP: (!) 177/71   (!) 140/78  Pulse: 66 65 78 75  Resp: 16 19 (!) 21 20  Temp:    (!) 97.5 F (36.4 C)  TempSrc:     Oral  SpO2: 100% 100% 100% 96%  Weight:      Height:        Physical Exam Constitutional:      General: She is not in acute distress.    Appearance: She is ill-appearing.  HENT:     Head: Normocephalic and atraumatic.     Mouth/Throat:     Mouth: Mucous membranes are moist.     Pharynx: Oropharynx is clear.  Eyes:     Extraocular Movements: Extraocular movements intact.     Pupils: Pupils are equal, round, and reactive to light.  Cardiovascular:     Rate and Rhythm: Normal rate and regular rhythm.     Pulses: Normal pulses.     Heart sounds: Normal heart sounds.  Pulmonary:     Effort: Pulmonary effort is normal. No respiratory distress.     Breath sounds: Rales (trace) present.  Abdominal:     General: Bowel sounds are normal. There is no distension.     Palpations: Abdomen is soft.     Tenderness: There is no abdominal tenderness.  Musculoskeletal:        General: No swelling or deformity.     Right lower leg: Edema present.     Left lower leg: Edema present.  Skin:    General: Skin is warm and dry.     Coloration: Skin is pale.  Neurological:     General: No focal deficit present.     Mental Status: Mental status is at baseline.    Labs on Admission: I have personally reviewed following labs and imaging studies  CBC: Recent Labs  Lab 12/04/23 1144  WBC 4.7  NEUTROABS 3.6  HGB 8.9*  HCT 27.1*  MCV 96.8  PLT 173    Basic Metabolic Panel: Recent Labs  Lab 12/04/23 1144  NA 138  K 4.4  CL 102  CO2 23  GLUCOSE 115*  BUN 17  CREATININE 1.75*  CALCIUM  9.2    GFR: Estimated Creatinine Clearance: 23.6 mL/min (A) (by C-G formula based on SCr of 1.75 mg/dL (H)).  Liver Function Tests: Recent Labs  Lab 12/04/23 1144  AST 29  ALT 24  ALKPHOS 78  BILITOT 0.9  PROT 6.3*  ALBUMIN  3.8    Urine analysis:  Component Value Date/Time   COLORURINE YELLOW 12/04/2023 1202   APPEARANCEUR CLEAR 12/04/2023 1202   LABSPEC 1.010 12/04/2023 1202    PHURINE 8.0 12/04/2023 1202   GLUCOSEU NEGATIVE 12/04/2023 1202   HGBUR NEGATIVE 12/04/2023 1202   BILIRUBINUR NEGATIVE 12/04/2023 1202   BILIRUBINUR negative 06/04/2016 0847   BILIRUBINUR neg 02/14/2015 1431   KETONESUR 15 (A) 12/04/2023 1202   PROTEINUR TRACE (A) 12/04/2023 1202   UROBILINOGEN 0.2 06/04/2016 0847   NITRITE NEGATIVE 12/04/2023 1202   LEUKOCYTESUR NEGATIVE 12/04/2023 1202    Radiological Exams on Admission: DG Chest Portable 1 View Result Date: 12/04/2023 CLINICAL DATA:  Shortness of breath. Left-sided thoracentesis two days ago. EXAM: PORTABLE CHEST 1 VIEW COMPARISON:  One-view chest x-ray 12/02/2023 FINDINGS: The heart is enlarged. Atherosclerotic calcifications are present at the aortic arch. Left-sided pneumothorax is improved but incompletely resolved. The right lung is clear. IMPRESSION: 1. Improved but incompletely resolved left-sided pneumothorax. 2. Cardiomegaly without failure. Electronically Signed   By: Audree Leas M.D.   On: 12/04/2023 16:49   CT Renal Stone Study Result Date: 12/04/2023 CLINICAL DATA:  Abdominal/flank pain, right-sided, history of UTI EXAM: CT ABDOMEN AND PELVIS WITHOUT CONTRAST TECHNIQUE: Multidetector CT imaging of the abdomen and pelvis was performed following the standard protocol without IV contrast. RADIATION DOSE REDUCTION: This exam was performed according to the departmental dose-optimization program which includes automated exposure control, adjustment of the mA and/or kV according to patient size and/or use of iterative reconstruction technique. COMPARISON:  08/08/2023 FINDINGS: Lower chest: Large hydropneumothorax at the left lung base. Transvenous pacing leads. Hepatobiliary: No focal liver abnormality is seen. No gallstones, gallbladder wall thickening, or biliary dilatation. Pancreas: Unremarkable. No pancreatic ductal dilatation or surrounding inflammatory changes. Spleen: Normal in size without focal abnormality.  Adrenals/Urinary Tract: No definite adrenal mass. Symmetric renal contours. Right kidney is ptotic. No urolithiasis or hydronephrosis. Urinary bladder partially distended. Stomach/Bowel: Stomach is nondistended, without acute finding. Small bowel decompressed. Colon unremarkable. Vascular/Lymphatic: Extensive aortoiliac calcified plaque without AAA. No evident abdominal or pelvic adenopathy. Reproductive: Status post hysterectomy. No adnexal masses. Other: Trace free fluid in the pelvis. No abdominal ascites. No free air. Musculoskeletal: Mild spondylitic changes in the mid lumbar spine. Osteitis pubis. IMPRESSION: 1. Large left hydropneumothorax. Critical Value/emergent results were called by telephone at the time of interpretation on 12/04/2023 at 12:24 pm to provider [facility] , who verbally acknowledged these results. 2. No acute findings in the abdomen or pelvis. 3.  Aortic Atherosclerosis (ICD10-I70.0). Electronically Signed   By: Nicoletta Barrier M.D.   On: 12/04/2023 12:25   EKG: Independently reviewed.  Paced rhythm at 73 bpm.  Low voltage multiple leads.  Nonspecific T wave changes.  QTc significantly prolonged at 596.  Assessment/Plan Principal Problem:   Hydropneumothorax Active Problems:   Ventricular fibrillation (HCC)   Acute on chronic diastolic heart failure (HCC)   Essential hypertension   CML (chronic myelocytic leukemia) (HCC)   Chronic kidney disease, stage III (moderate) (HCC)   Acquired hypothyroidism   Anemia of chronic disease   Obesity, class 1   History of cardiac arrest   ICD (implantable cardioverter-defibrillator) in place   Paroxysmal ventricular tachycardia (HCC)   Hydropneumothorax Flank pain > Patient presenting with flank pain found to have continued significant hydropneumothorax.  Unclear if this is causing pain.  CT of the abdomen and pelvis/renal stone study was negative for other acute abnormality. > Has undergone therapeutic thoracentesis.  Recent was 2 days  ago with 1.45 L pulled  off.  Did have concern for residual fluid and incomplete reexpansion after a postprocedure x-ray showed opacity. > This effusion is in the setting of CHF and CML. > Imaging in the ED redemonstrated large left hydropneumothorax. > Pulmonology consulted by EDP and are seeing the patient for evaluation of possible chest tube placement. - Monitoring on progressive unit - Appreciate pulmonology recommendations and assistance - Treatment of CHF as below - Consideration for chest tube per pulmonology - Supportive care  Acute on chronic diastolic CHF > Last echo was 4 months ago with EF 55-60%, G1 DD, normal RV function. (Has recovered ejection fraction with history of systolic dysfunction) > Has had some worsening edema and this recurrent pleural effusion. > BNP significantly elevated to 4071.  Cardiomegaly on exam, with pleural effusion noted on imaging but no obvious pulmonary edema reported. > Received 40 mg IV Lasix  in the ED. - Monitor on progressive as above - Will repeat echocardiogram if this recurrent pleural effusion and significantly elevated BNP. - Strict I's and O's, daily weights - Check magnesium  - Trend renal function at electrolytes  Paroxysmal V. tach History of V-fib History of cardiac arrest Acute evaluation > Status post pacemaker.  QTc prolonged at 596 on initial EKG. - Continue home amiodarone  - Trend EKG - Avoid QT prolonging medications - Check magnesium  ADDENDUMM > Patient had several episodes after arrival where her defibrillator fired and she apparently had transient arrest. - As critical care was evaluating patient for chest tube family with these breakthrough cardiac events they have agreed to transfer patient to their service in the ICU for the time being. - They have been communication with cardiology and patient has been started on amiodarone  infusion.  Nausea > Recurrent issues with nausea and vomiting.  Received a couple doses of  Zofran  in the ED. > Unfortunately, EKG in the ED showed QTc of 596.  In with patient's history of paroxysmal V. tach, V-fib and cardiac arrest will hold off on further QTc prolonging medications. - Recheck EKG now and in the morning - Consider alternative treatments for nausea in the meantime, such as ativan  Recent UTI > Diagnosed at urgent care with E. coli in culture growing 60-100,000 colony forming units. - Continue with outpatient Keflex for now  Hypertension - Lasix  as above  Hypothyroidism - Continue home thyroid  Armour  Anemia > Hemoglobin stable at 8.9 - Trend CBC  CML > Followed by [facility] oncology. - Continue home imatinib  (notified that this will need to be held by pharmacy unless it can be confirmed that it needs to be given inpatient by her oncologist)  Obesity - Noted  DVT prophylaxis: Heparin  Code Status:   Full Admission status:  Patient now admitted to critical care service in the ICU.  Remains inpatient.  This note has been changed to a one-time consult note given this change occurred prior to note been signed.  Critical care has written an H&P.   Johnetta Nab MD [facility]

## 2023-12-04 NOTE — H&P (Addendum)
 NAME:  Lisa [name], MRN:  [id number], DOB:  1941-05-22, LOS: 0 ADMISSION DATE:  12/04/2023, CONSULTATION DATE:  12/04/2023 REFERRING MD:  Dr. Rufina Cough - [facility], CHIEF COMPLAINT:  Pleural effusion    History of Present Illness:  Lisa [name] is a 83 y.o. female with a past medical history significant for CHF, CAD, arrhythmias including V. tach, hypertension,, CML, and melanoma who presented to the ED ED at [facility] 4/26 for complaints of progressive right-sided flank and back pain that began 4 to 5 days prior to admission.  Of note patient underwent thoracentesis per interventional radiology 2 days prior to admission for large pleural effusion, 1500 mL were removed.  Appears patient had ex vacuo pneumo post thoracentesis.  I do not see in our system to see that any samples/cytology was obtained during thoracentesis.  She also had an urgent care visit at [facility] on 4/24 for similar complaints of back pain, flank pain and nausea where she was diagnosed with E. coli UTI and started on Keflex.    Patient was admitted per hospitalist with pulmonary critical care consulted for management of hydro pneumothorax On arrival to [facility] patient went into repeated V. tach with firing of her implanted ICD.  She will be transferred to the ICU and started on amnio drip  Pertinent  Medical History  CHF, CAD, arrhythmias including V. tach, hypertension,, CML, and melanoma  Significant Hospital Events: Including procedures, antibiotic start and stop dates in addition to other pertinent events   4/26 presented with progressive right sided flank pain with nausea and decreased oral intake.  CT renal study with large left hydropneumothorax  Interim History / Subjective:  As above  Objective   Blood pressure (!) 140/78, pulse 75, temperature (!) 97.5 F (36.4 C), temperature source Oral, resp. rate 20, height 5\' 1"  (1.549 m), weight 76.7 kg, last menstrual period 02/14/1995, SpO2 96%.       No  intake or output data in the 24 hours ending 12/04/23 1714 Filed Weights   12/04/23 1048  Weight: 76.7 kg   Examination: Gen:      No acute distress chronically ill-appearing HEENT:  EOMI, sclera anicteric Neck:     No masses; no thyromegaly Lungs:    Clear to auscultation bilaterally; normal respiratory effort CV:         Regular rate and rhythm; no murmurs Abd:      + bowel sounds; soft, non-tender; no palpable masses, no distension Ext:    No edema; adequate peripheral perfusion Skin:      Warm and dry; no rash Neuro: Awake, oriented.  Brief periods of unresponsiveness when she goes into V. tach  Lab/imaging reviewed Significant for BUN/creatinine 17/1.75 Hemoglobin 8.9, platelets 173  CT renal 12/04/2023-large left hydro pneumothorax, no acute findings in the abdomen or pelvis Chest x-ray 12/04/2023-pneumothorax ex vacuo on the left.  Resolved Hospital Problem list     Assessment & Plan:  Ventricular tachycardia Experienced a dizzy spell with runs of ventricular tachycardia on the monitor on arrival at [facility].  Implanted defibrillator has gone on several times - Move to ICU - Administer amiodarone  drip -Recheck electrolytes and replete as needed.  Check echocardiogram - Consult cardiology team for evaluation  UTI Diagnosed with E. coli UTI urgent care few days ago On Keflex Recheck blood culture, urine culture  Pneumothorax ex vacuo Pneumothorax ex vacuo following thoracentesis for left-sided pleural effusion.  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  Suspect that the lung is trapped and the lung is not fully expanded, likely due to inflammation and scarring from prolonged effusion. Currently, the pneumothorax is not the primary concern given the cardiac issues. Will obtain CT chest when stabilized from cardiac standpoint.  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  Hold for  now Monitor CBC  Hypothyroidism Check TSH.  Hold thyroid  pills  Best Practice (right click and "Reselect all SmartList Selections" daily)  Per primary   Labs   CBC: Recent Labs  Lab 12/04/23 1144  WBC 4.7  NEUTROABS 3.6  HGB 8.9*  HCT 27.1*  MCV 96.8  PLT 173    Basic Metabolic Panel: Recent Labs  Lab 12/04/23 1144  NA 138  K 4.4  CL 102  CO2 23  GLUCOSE 115*  BUN 17  CREATININE 1.75*  CALCIUM  9.2   GFR: Estimated Creatinine Clearance: 23.6 mL/min (A) (by C-G formula based on SCr of 1.75 mg/dL (H)). Recent Labs  Lab 12/04/23 1144  WBC 4.7    Liver Function Tests: Recent Labs  Lab 12/04/23 1144  AST 29  ALT 24  ALKPHOS 78  BILITOT 0.9  PROT 6.3*  ALBUMIN  3.8   No results for input(s): "LIPASE", "AMYLASE" in the last 168 hours. No results for input(s): "AMMONIA" in the last 168 hours.  ABG    Component Value Date/Time   HCO3 26.8 08/09/2023 0427   TCO2 27 08/08/2023 1709   ACIDBASEDEF 1.0 08/09/2023 0427   O2SAT 95.9 08/09/2023 0427     Coagulation Profile: No results for input(s): "INR", "PROTIME" in the last 168 hours.  Cardiac Enzymes: No results for input(s): "CKTOTAL", "CKMB", "CKMBINDEX", "TROPONINI" in the last 168 hours.  HbA1C: Hgb A1c MFr Bld  Date/Time Value Ref Range Status  07/17/2023 03:46 PM 5.4 4.8 - 5.6 % Final    Comment:    (NOTE) Pre diabetes:          5.7%-6.4%  Diabetes:              >6.4%  Glycemic control for   <7.0% adults with diabetes   10/03/2015 11:42 PM 6.2 (H) 4.8 - 5.6 % Final    Comment:    (NOTE)         Pre-diabetes: 5.7 - 6.4         Diabetes: >6.4         Glycemic control for adults with diabetes: <7.0     CBG: No results for input(s): "GLUCAP" in the last 168 hours.  Review of Systems:     Past Medical History:  She,  has a past medical history of CAD in native artery (2017), Cardiac arrest (HCC) (09/2015), CHF (congestive heart failure) (HCC), CML (chronic myelocytic leukemia)  (HCC), Complication of anesthesia, UTI (urinary tract infection), Hypertension, Melanoma (HCC), PONV (postoperative nausea and vomiting), PVC (premature ventricular contraction), Thyroid  disease, and V tach (HCC).   Surgical History:   Past Surgical History:  Procedure Laterality Date   ABDOMINAL HYSTERECTOMY     APPENDECTOMY     CARDIAC CATHETERIZATION N/A 10/03/2015   Procedure: Left Heart Cath and Coronary Angiography;  Surgeon: Peter M Swaziland, MD;  Location: [facility] INVASIVE CV LAB;  Service: Cardiovascular;  Laterality: N/A;   CARDIAC CATHETERIZATION N/A 10/07/2015   Procedure: Coronary Stent Intervention;  Surgeon: Odie Benne, MD;  Location: [facility] INVASIVE CV LAB;  Service: Cardiovascular;  Laterality: N/A;   CARDIAC  CATHETERIZATION  09/22/2019   EP IMPLANTABLE DEVICE N/A 01/20/2016   Procedure: BiV ICD Insertion CRT-D;  Surgeon: Tammie Fall, MD;  Location: [facility] INVASIVE CV LAB;  Service: Cardiovascular;  Laterality: N/A;   IR THORACENTESIS ASP PLEURAL SPACE W/IMG GUIDE  07/20/2023   IR THORACENTESIS ASP PLEURAL SPACE W/IMG GUIDE  12/02/2023   LEFT HEART CATH AND CORONARY ANGIOGRAPHY N/A 09/22/2019   Procedure: LEFT HEART CATH AND CORONARY ANGIOGRAPHY;  Surgeon: Swaziland, Peter M, MD;  Location: [facility] INVASIVE CV LAB;  Service: Cardiovascular;  Laterality: N/A;   TONSILLECTOMY     TONSILLECTOMY     TUBAL LIGATION       Social History:   reports that she has never smoked. She has never used smokeless tobacco. She reports that she does not drink alcohol and does not use drugs.   Family History:  Her family history includes Coronary artery disease in her mother; Heart attack in her maternal grandfather and paternal grandfather; Multiple myeloma in her father; Thyroid  disease in her mother.   Allergies Allergies  Allergen Reactions   Flagyl [Metronidazole] Itching, Rash and Other (See Comments)    Welts, also   Penicillins Hives, Itching and Rash    Has patient had a PCN reaction  causing immediate rash, facial/tongue/throat swelling, SOB or lightheadedness with hypotension: Yes  Has patient had a PCN reaction causing severe rash involving mucus membranes or skin necrosis: Yes Has patient had a PCN reaction that required hospitalization No Has patient had a PCN reaction occurring within the last 10 years: NO If all of the above answers are "NO", then may proceed with Cephalosporin use.    Sulfamethoxazole Rash   Dilaudid  [Hydromorphone  Hcl] Nausea And Vomiting   Meperidine Nausea And Vomiting   Propoxyphene     Other reaction(s): Unknown   Tape Other (See Comments)    "THE PLASTIC, CLEAR TAPE CAN/DOES PULL OFF MY SKIN."   Doxycycline  Nausea Only, Rash and Other (See Comments)    Made her "feel terrible"   Lanolin Itching and Rash   Sulfa Antibiotics Rash     Home Medications  Prior to Admission medications   Medication Sig Start Date End Date Taking? Authorizing Provider  cephALEXin (KEFLEX) 500 MG capsule Take 500 mg by mouth. 12/02/23 12/07/23 Yes [provider]  acetaminophen  (TYLENOL ) 325 MG tablet Take 2 tablets (650 mg total) by mouth every 6 (six) hours as needed for mild pain (pain score 1-3) or moderate pain (pain score 4-6). 08/21/23   Arrien, Mauricio Daniel, MD  allopurinol  (ZYLOPRIM ) 100 MG tablet Take 100 mg by mouth daily.  06/15/17   [provider]  aluminum hydroxide-magnesium  carbonate (GAVISCON) 95-358 MG/15ML SUSP Take 30 mLs by mouth as needed for indigestion or heartburn.    [provider]  amiodarone  (PACERONE ) 200 MG tablet Take 1 tablet (200 mg total) by mouth daily. Patient taking differently: Take 200 mg by mouth at bedtime. 07/27/23   Tammie Fall, MD  aspirin  EC 81 MG tablet Take 81 mg by mouth in the morning.    [provider]  atorvastatin  (LIPITOR) 20 MG tablet Take 1 tablet (20 mg total) by mouth daily. 05/07/16   Bensimhon, Rheta Celestine, MD  b complex vitamins capsule Take 1 capsule by mouth  daily.    [provider]  calcium  carbonate (OS-CAL - DOSED IN MG OF ELEMENTAL CALCIUM ) 1250 (500 Ca) MG tablet Take 1 tablet by mouth daily as needed (Calcium  levels).    [provider]  Cholecalciferol  (VITAMIN D -3 PO) Take 5,000 Units by mouth daily.    [provider]  Cyanocobalamin (VITAMIN B12 SL) Place 1 tablet under the tongue daily.    [provider]  imatinib  (GLEEVEC ) 400 MG tablet Take 400 mg by mouth at bedtime. 02/16/17   [provider]  loperamide  (IMODIUM ) 2 MG capsule Take 2 mg by mouth as needed for diarrhea or loose stools.    [provider]  MAGNESIUM  GLYCINATE PO Take 1 tablet by mouth at bedtime.    [provider]  Multiple Vitamin (MULTIVITAMIN WITH MINERALS) TABS tablet Take 1 tablet by mouth daily.    [provider]  nitroGLYCERIN  (NITROSTAT ) 0.4 MG SL tablet Place 1 tablet (0.4 mg total) under the tongue every 5 (five) minutes x 3 doses as needed for chest pain. 08/21/23   Arrien, Curlee Doss, MD  thyroid  (ARMOUR) 15 MG tablet Take 15 mg by mouth daily before breakfast.     [provider]  timolol  (TIMOPTIC ) 0.5 % ophthalmic solution Place 1 drop into the left eye 2 (two) times daily. 07/15/23   [provider]  torsemide  (DEMADEX ) 20 MG tablet Take 1 tablet (20 mg total) by mouth daily. 09/13/23   Elmyra Haggard, MD  vitamin C  (ASCORBIC ACID ) 500 MG tablet Take 500 mg by mouth daily.    [provider]     Critical care time:     The patient is critically ill with multiple organ system failure and requires high complexity decision making for assessment and support, frequent evaluation and titration of therapies, advanced monitoring, review of radiographic studies and interpretation of complex data.   Critical Care Time devoted to patient care services, exclusive of separately billable procedures, described in this note is 35 minutes.   Alasdair Kleve MD [facility] See Amion for pager  If no response to pager , please call [phone] until 7pm After 7:00 pm call Elink  [phone] 12/04/2023, 6:29 PM

## 2023-12-04 NOTE — Consult Note (Signed)
 Cardiology Consultation   Patient ID: Lisa [name] MRN: [id number]; DOB: 1941-03-21  Admit date: 12/04/2023 Date of Consult: 12/04/2023  PCP:  [name], DO   [facility] Providers Cardiologist:  Ola Berger, MD  Electrophysiologist:  Manya Sells, MD       Patient Profile:   Lisa [name] is a 83 y.o. female with a hx of VT who is being seen 12/04/2023 for the evaluation of VT at the request of critical care.  History of Present Illness:   [name] has chronic systolic HF, a prior h/o PAF and VT s/p a BiV ICD on chronically on amiodarone  200 daily.  Of note, seen in clinic in March with ICD in ERI and was being set up for a generator change.  Recently, she has been struggling w/ fluid retention despite increasing diuretic dosing. Admitted from ED with flank pain and E.coli UTI, also a hydropneumothorax after a recent therapeutic thoracentesis.    On arrival to [facility], had VT with ICD firing x 3.  Transferred to [facility]H and on arrival here had another episode of polymorphic VT that terminated after ICD firing. Denies preceding chest discomfort or tightness, just feels generally unwell.  Also wretching and mostly dry heaving, but Qtc prolonged on ECG.   Past Medical History:  Diagnosis Date   Acute on chronic systolic heart failure (HCC)    Acute renal failure superimposed on stage 3b chronic kidney disease (HCC) 05/22/2022   Bacteremia due to Pseudomonas 06/16/2022   CAD in native artery 2017   stent to LAD   Cardiac arrest [facility]) 09/2015   CHF (congestive heart failure) (HCC)    CML (chronic myelocytic leukemia) (HCC)    Complication of anesthesia    Hx: UTI (urinary tract infection)    Hypertension    Melanoma (HCC)    PONV (postoperative nausea and vomiting)    PVC (premature ventricular contraction)    HISTORY OF   Thyroid  disease    V tach (HCC)     Past Surgical History:  Procedure Laterality Date   ABDOMINAL HYSTERECTOMY      APPENDECTOMY     CARDIAC CATHETERIZATION N/A 10/03/2015   Procedure: Left Heart Cath and Coronary Angiography;  Surgeon: Peter M Swaziland, MD;  Location: [facility] INVASIVE CV LAB;  Service: Cardiovascular;  Laterality: N/A;   CARDIAC CATHETERIZATION N/A 10/07/2015   Procedure: Coronary Stent Intervention;  Surgeon: Odie Benne, MD;  Location: [facility] INVASIVE CV LAB;  Service: Cardiovascular;  Laterality: N/A;   CARDIAC CATHETERIZATION  09/22/2019   EP IMPLANTABLE DEVICE N/A 01/20/2016   Procedure: BiV ICD Insertion CRT-D;  Surgeon: Tammie Fall, MD;  Location: [facility] INVASIVE CV LAB;  Service: Cardiovascular;  Laterality: N/A;   IR THORACENTESIS ASP PLEURAL SPACE W/IMG GUIDE  07/20/2023   IR THORACENTESIS ASP PLEURAL SPACE W/IMG GUIDE  12/02/2023   LEFT HEART CATH AND CORONARY ANGIOGRAPHY N/A 09/22/2019   Procedure: LEFT HEART CATH AND CORONARY ANGIOGRAPHY;  Surgeon: Swaziland, Peter M, MD;  Location: [facility] INVASIVE CV LAB;  Service: Cardiovascular;  Laterality: N/A;   TONSILLECTOMY     TONSILLECTOMY     TUBAL LIGATION         Inpatient Medications: Scheduled Meds:  LORazepam       [START ON 12/05/2023] atorvastatin   20 mg Oral Daily   cephALEXin  500 mg Oral Q12H   Chlorhexidine  Gluconate Cloth  6 each Topical Daily   [START ON 12/05/2023] furosemide   40 mg Intravenous BID  heparin   5,000 Units Subcutaneous Q8H   imatinib   400 mg Oral QHS   [START ON 12/05/2023] thyroid   15 mg Oral QAC breakfast   Continuous Infusions:  amiodarone  60 mg/hr (12/04/23 1824)   Followed by   Cecily Cohen ON 12/05/2023] amiodarone      amiodarone      amiodarone      magnesium  sulfate bolus IVPB     PRN Meds: LORazepam, acetaminophen  **OR** acetaminophen , aluminum hydroxide-magnesium  carbonate, amiodarone , amiodarone , ondansetron  (ZOFRAN ) IV, polyethylene glycol  Allergies:    Allergies  Allergen Reactions   Flagyl [Metronidazole] Itching, Rash and Other (See Comments)    Welts, also   Penicillins Hives, Itching and  Rash    Has patient had a PCN reaction causing immediate rash, facial/tongue/throat swelling, SOB or lightheadedness with hypotension: Yes  Has patient had a PCN reaction causing severe rash involving mucus membranes or skin necrosis: Yes Has patient had a PCN reaction that required hospitalization No Has patient had a PCN reaction occurring within the last 10 years: NO If all of the above answers are "NO", then may proceed with Cephalosporin use.    Sulfamethoxazole Rash   Dilaudid  [Hydromorphone  Hcl] Nausea And Vomiting   Meperidine Nausea And Vomiting   Propoxyphene     Other reaction(s): Unknown   Tape Other (See Comments)    "THE PLASTIC, CLEAR TAPE CAN/DOES PULL OFF MY SKIN."   Doxycycline  Nausea Only, Rash and Other (See Comments)    Made her "feel terrible"   Lanolin Itching and Rash   Sulfa Antibiotics Rash    Social History:   Social History   Socioeconomic History   Marital status: Divorced    Spouse name: Not on file   Number of children: Not on file   Years of education: Not on file   Highest education level: Not on file  Occupational History   Not on file  Tobacco Use   Smoking status: Never   Smokeless tobacco: Never  Vaping Use   Vaping status: Never Used  Substance and Sexual Activity   Alcohol use: No    Alcohol/week: 0.0 standard drinks of alcohol   Drug use: No   Sexual activity: Not on file  Other Topics Concern   Not on file  Social History Narrative   Not on file   Social Drivers of Health   Financial Resource Strain: Low Risk  (10/28/2022)   Received from [facility]   Overall Financial Resource Strain (CARDIA)    Difficulty of Paying Living Expenses: Not hard at all  Food Insecurity: No Food Insecurity (08/09/2023)   Hunger Vital Sign    Worried About Running Out of Food in the Last Year: Never true    Ran Out of Food in the Last Year: Never true  Transportation Needs: No Transportation Needs (08/09/2023)   PRAPARE -  Administrator, Civil Service (Medical): No    Lack of Transportation (Non-Medical): No  Physical Activity: Not on file  Stress: Not on file  Social Connections: Moderately Isolated (08/09/2023)   Social Connection and Isolation Panel [NHANES]    Frequency of Communication with Friends and Family: More than three times a week    Frequency of Social Gatherings with Friends and Family: Twice a week    Attends Religious Services: Never    Database administrator or Organizations: Yes    Attends Engineer, structural: More than 4 times per year    Marital Status: Divorced  Catering manager  Violence: Not At Risk (08/09/2023)   Humiliation, Afraid, Rape, and Kick questionnaire    Fear of Current or Ex-Partner: No    Emotionally Abused: No    Physically Abused: No    Sexually Abused: No    Family History:    Family History  Problem Relation Age of Onset   Coronary artery disease Mother    Thyroid  disease Mother    Multiple myeloma Father    Heart attack Maternal Grandfather    Heart attack Paternal Grandfather      ROS:  Please see the history of present illness.   All other ROS reviewed and negative.     Physical Exam/Data:   Vitals:   12/04/23 1545 12/04/23 1600 12/04/23 1700 12/04/23 1759  BP:   (!) 140/78 (!) 165/78  Pulse: 65 78 75 77  Resp: 19 (!) 21 20   Temp:   (!) 97.5 F (36.4 C)   TempSrc:   Oral   SpO2: 100% 100% 96% 99%  Weight:      Height:       No intake or output data in the 24 hours ending 12/04/23 1851    12/04/2023   10:48 AM 11/01/2023   11:48 AM 08/21/2023    4:59 AM  Last 3 Weights  Weight (lbs) 169 lb 174 lb 176 lb 9.4 oz  Weight (kg) 76.658 kg 78.926 kg 80.1 kg     Body mass index is 31.93 kg/m.  General:  Well nourished, frail, mild acute distress HEENT: normal Neck: elevated JVD Vascular: No carotid bruits; Distal pulses 2+ bilaterally Cardiac:  normal S1, S2; RRR; no murmur  Lungs:  decreased breath sounds  anteriorly Abd: soft, nontender, no hepatomegaly  Ext: 3+ chronic B LE edema Musculoskeletal:  No deformities, BUE and BLE strength normal and equal, shaky Skin: warm and dry  Neuro:  no focal abnormalities noted Psych:  Normal affect   EKG:  The EKG was personally reviewed and demonstrates:  Qtc prolonged paced rhythm Telemetry:  Telemetry was personally reviewed and demonstrates:  VT confirmed  Relevant CV Studies: EF 55% on echo 07/2023.   Laboratory Data:  Recent Labs  Lab 12/04/23 1144  NA 138  K 4.4  CL 102  CO2 23  GLUCOSE 115*  BUN 17  CREATININE 1.75*  CALCIUM  9.2  GFRNONAA 29*  ANIONGAP 13    Recent Labs  Lab 12/04/23 1144  PROT 6.3*  ALBUMIN  3.8  AST 29  ALT 24  ALKPHOS 78  BILITOT 0.9   Lipids No results for input(s): "CHOL", "TRIG", "HDL", "LABVLDL", "LDLCALC", "CHOLHDL" in the last 168 hours.  Hematology Recent Labs  Lab 12/04/23 1144 12/04/23 1821  WBC 4.7 7.0  RBC 2.80* 3.18*  HGB 8.9* 10.2*  HCT 27.1* 30.2*  MCV 96.8 95.0  MCH 31.8 32.1  MCHC 32.8 33.8  RDW 15.5 15.8*  PLT 173 264   Thyroid  No results for input(s): "TSH", "FREET4" in the last 168 hours.  BNP Recent Labs  Lab 12/04/23 1144  PROBNP 4,071.0*    DDimer No results for input(s): "DDIMER" in the last 168 hours.   Radiology/Studies:  DG Chest Portable 1 View Result Date: 12/04/2023 CLINICAL DATA:  Shortness of breath. Left-sided thoracentesis two days ago. EXAM: PORTABLE CHEST 1 VIEW COMPARISON:  One-view chest x-ray 12/02/2023 FINDINGS: The heart is enlarged. Atherosclerotic calcifications are present at the aortic arch. Left-sided pneumothorax is improved but incompletely resolved. The right lung is clear. IMPRESSION: 1. Improved but incompletely resolved  left-sided pneumothorax. 2. Cardiomegaly without failure. Electronically Signed   By: Audree Leas M.D.   On: 12/04/2023 16:49   CT Renal Stone Study Result Date: 12/04/2023 CLINICAL DATA:  Abdominal/flank  pain, right-sided, history of UTI EXAM: CT ABDOMEN AND PELVIS WITHOUT CONTRAST TECHNIQUE: Multidetector CT imaging of the abdomen and pelvis was performed following the standard protocol without IV contrast. RADIATION DOSE REDUCTION: This exam was performed according to the departmental dose-optimization program which includes automated exposure control, adjustment of the mA and/or kV according to patient size and/or use of iterative reconstruction technique. COMPARISON:  08/08/2023 FINDINGS: Lower chest: Large hydropneumothorax at the left lung base. Transvenous pacing leads. Hepatobiliary: No focal liver abnormality is seen. No gallstones, gallbladder wall thickening, or biliary dilatation. Pancreas: Unremarkable. No pancreatic ductal dilatation or surrounding inflammatory changes. Spleen: Normal in size without focal abnormality. Adrenals/Urinary Tract: No definite adrenal mass. Symmetric renal contours. Right kidney is ptotic. No urolithiasis or hydronephrosis. Urinary bladder partially distended. Stomach/Bowel: Stomach is nondistended, without acute finding. Small bowel decompressed. Colon unremarkable. Vascular/Lymphatic: Extensive aortoiliac calcified plaque without AAA. No evident abdominal or pelvic adenopathy. Reproductive: Status post hysterectomy. No adnexal masses. Other: Trace free fluid in the pelvis. No abdominal ascites. No free air. Musculoskeletal: Mild spondylitic changes in the mid lumbar spine. Osteitis pubis. IMPRESSION: 1. Large left hydropneumothorax. Critical Value/emergent results were called by telephone at the time of interpretation on 12/04/2023 at 12:24 pm to provider [facility] , who verbally acknowledged these results. 2. No acute findings in the abdomen or pelvis. 3.  Aortic Atherosclerosis (ICD10-I70.0). Electronically Signed   By: Nicoletta Barrier M.D.   On: 12/04/2023 12:25   IR THORACENTESIS ASP PLEURAL SPACE W/IMG GUIDE Result Date: 12/03/2023 INDICATION: Patient with history of  CML, CHF, left pleural effusion. IR consulted for therapeutic left thoracentesis. EXAM: ULTRASOUND GUIDED THERAPEUTIC LEFT THORACENTESIS MEDICATIONS: 10 mL 1% lidocaine  COMPLICATIONS: Patient tolerated procedure well with no immediate complication. On post-op CXR, findings show lucency in the left base. Correlate with under-expanded lung. (Pneumothorax ex vacuo). No lung markings are seen in the left apex either. But the line of a pneumothorax is not clearly defined. Findings were reviewed with Dr. Marne Sings who believes this is likely due to a stuck lung and under expansion. Does recommend patient not receive future thoracentesis as she cannot tolerate from today's findings - pt's care team made aware. Pt remains feeling well, Vitals 98% on RA, HR 70, BP 157/73 PROCEDURE: An ultrasound guided thoracentesis was thoroughly discussed with the patient and questions answered. The benefits, risks, alternatives and complications were also discussed. The patient understands and wishes to proceed with the procedure. Written consent was obtained. Ultrasound was performed to localize and mark an adequate pocket of fluid in the left chest. The area was then prepped and draped in the normal sterile fashion. 1% Lidocaine  was used for local anesthesia. Under ultrasound guidance a 6 Fr Safe-T-Centesis catheter was introduced. Thoracentesis was performed. The catheter was removed and a dressing applied. FINDINGS: A total of approximately 1.45 L of yellow fluid was removed. IMPRESSION: Successful ultrasound guided left thoracentesis yielding 1.45 L of pleural fluid. Performed by: Wyatt Pommier, PA-C Electronically Signed   By: Fernando Hoyer M.D.   On: 12/03/2023 12:57   DG Chest 1 View Result Date: 12/02/2023 CLINICAL DATA:  Shortness of breath post thoracentesis EXAM: CHEST  1 VIEW COMPARISON:  August 17, 2023 chest x-ray FINDINGS: Lucency in the left base. Correlate with underexpanded lung.) (Pneumothorax ex vacuo). No  lung markings are seen in the left apex either. But the line of a pneumothorax is not clearly defined. Right lung clear Heart normal size No change in the left subclavian bipolar ICDF pacemaker device tip of the leads in good position no evidence of discontinuity IMPRESSION: Lucency in the left base. Correlate with underexpanded lung. (Pneumothorax ex vacuo). No lung markings are seen in the left apex either. But the line of a pneumothorax is not clearly defined. Electronically Signed   By: Fredrich Jefferson M.D.   On: 12/02/2023 14:41   CUP PACEART REMOTE DEVICE CHECK Result Date: 12/02/2023 Monthly battery check.  RRT on 10/23/23. Previously reported.  Gen change scheduled on 01/07/24 per Epic. Continue monthly battery checks per IFU until generator change per protocol. Normal device function. No new alerts. Follow up as scheduled monthly AB,CVRS Monthly battery check.  RRT on 10/23/23. Previously reported.  Gen change scheduled on 01/07/24 per Epic. Continue monthly battery checks per IFU until generator change per protocol. Normal device function. No new alerts. HF diagnostics abnormal during this monitoring period. 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 Agree with IV amiodarone  60/hr now for at least the next 12 hours. Her battery is ERI, but so far the device is obviously firing. I ordered a little ativan to help w/ her wretching, as her Qtc is prolonged. Also ordered some extra mag sulfate to "calm" her electrically, would keep Mg>2 and K nicely between 4 and 4.5.  Will follow. No suspicion for ischemia currently driving her VT.  Risk Assessment/Risk Scores:        [place]  Heart Association (NYHA) Functional Class NYHA Class III        For questions or updates, please contact [facility] Please consult [email or web address] for contact info under    Signed, Philmore Bream, MD  12/04/2023 6:51 PM

## 2023-12-04 NOTE — Progress Notes (Signed)
 eLink Physician-Brief Progress Note Patient Name: Lisa [name] DOB: [birth year] MRN: [id number]   Date of Service  12/04/2023  HPI/Events of Note  chronic systolic HF, a prior h/o PAF and VT s/p a BiV ICD on chronically on amiodarone  200 daily, patient had recurrent ICD firing in the setting of polymorphic VT  K3.3  eICU Interventions  KCl ordered     Intervention Category Minor Interventions: Routine modifications to care plan (e.g. PRN medications for pain, fever)  Greycen Felter 12/04/2023, 8:58 PM

## 2023-12-05 ENCOUNTER — Inpatient Hospital Stay (HOSPITAL_COMMUNITY)

## 2023-12-05 DIAGNOSIS — I5022 Chronic systolic (congestive) heart failure: Secondary | ICD-10-CM | POA: Diagnosis not present

## 2023-12-05 DIAGNOSIS — I5042 Chronic combined systolic (congestive) and diastolic (congestive) heart failure: Secondary | ICD-10-CM

## 2023-12-05 DIAGNOSIS — Z95811 Presence of heart assist device: Secondary | ICD-10-CM

## 2023-12-05 DIAGNOSIS — I4729 Other ventricular tachycardia: Secondary | ICD-10-CM

## 2023-12-05 DIAGNOSIS — N1832 Chronic kidney disease, stage 3b: Secondary | ICD-10-CM | POA: Diagnosis not present

## 2023-12-05 DIAGNOSIS — J9383 Other pneumothorax: Secondary | ICD-10-CM | POA: Diagnosis not present

## 2023-12-05 DIAGNOSIS — I472 Ventricular tachycardia, unspecified: Secondary | ICD-10-CM | POA: Diagnosis not present

## 2023-12-05 DIAGNOSIS — N390 Urinary tract infection, site not specified: Secondary | ICD-10-CM | POA: Diagnosis not present

## 2023-12-05 LAB — ECHOCARDIOGRAM COMPLETE
AR max vel: 2.05 cm2
AV Peak grad: 7.3 mmHg
Ao pk vel: 1.35 m/s
Area-P 1/2: 3.65 cm2
Calc EF: 43 %
Height: 61 in
S' Lateral: 3.6 cm
Single Plane A2C EF: 38.4 %
Single Plane A4C EF: 45.4 %
Weight: 2704 [oz_av]

## 2023-12-05 LAB — LACTATE DEHYDROGENASE, PLEURAL OR PERITONEAL FLUID: LD, Fluid: 149 U/L — ABNORMAL HIGH (ref 3–23)

## 2023-12-05 LAB — BASIC METABOLIC PANEL WITH GFR
Anion gap: 12 (ref 5–15)
BUN: 17 mg/dL (ref 8–23)
CO2: 22 mmol/L (ref 22–32)
Calcium: 8.3 mg/dL — ABNORMAL LOW (ref 8.9–10.3)
Chloride: 102 mmol/L (ref 98–111)
Creatinine, Ser: 1.7 mg/dL — ABNORMAL HIGH (ref 0.44–1.00)
GFR, Estimated: 30 mL/min — ABNORMAL LOW (ref >60)
Glucose, Bld: 128 mg/dL — ABNORMAL HIGH (ref 70–99)
Potassium: 4.5 mmol/L (ref 3.5–5.1)
Sodium: 136 mmol/L (ref 135–145)

## 2023-12-05 LAB — CBC
HCT: 25.5 % — ABNORMAL LOW (ref 36.0–46.0)
Hemoglobin: 8.2 g/dL — ABNORMAL LOW (ref 12.0–15.0)
MCH: 31.3 pg (ref 26.0–34.0)
MCHC: 32.2 g/dL (ref 30.0–36.0)
MCV: 97.3 fL (ref 80.0–100.0)
Platelets: 169 10*3/uL (ref 150–400)
RBC: 2.62 MIL/uL — ABNORMAL LOW (ref 3.87–5.11)
RDW: 15.3 % (ref 11.5–15.5)
WBC: 5 10*3/uL (ref 4.0–10.5)
nRBC: 0 % (ref 0.0–0.2)

## 2023-12-05 LAB — BODY FLUID CELL COUNT WITH DIFFERENTIAL
Eos, Fluid: 1 %
Lymphs, Fluid: 57 %
Monocyte-Macrophage-Serous Fluid: 17 % — ABNORMAL LOW (ref 50–90)
Neutrophil Count, Fluid: 25 % (ref 0–25)
Total Nucleated Cell Count, Fluid: 296 uL (ref 0–1000)

## 2023-12-05 LAB — PROTEIN, PLEURAL OR PERITONEAL FLUID: Total protein, fluid: 3.6 g/dL

## 2023-12-05 LAB — COMPREHENSIVE METABOLIC PANEL WITH GFR
ALT: 21 U/L (ref 0–44)
AST: 25 U/L (ref 15–41)
Albumin: 2.8 g/dL — ABNORMAL LOW (ref 3.5–5.0)
Alkaline Phosphatase: 46 U/L (ref 38–126)
Anion gap: 9 (ref 5–15)
BUN: 16 mg/dL (ref 8–23)
CO2: 22 mmol/L (ref 22–32)
Calcium: 8.2 mg/dL — ABNORMAL LOW (ref 8.9–10.3)
Chloride: 104 mmol/L (ref 98–111)
Creatinine, Ser: 1.49 mg/dL — ABNORMAL HIGH (ref 0.44–1.00)
GFR, Estimated: 35 mL/min — ABNORMAL LOW (ref >60)
Glucose, Bld: 109 mg/dL — ABNORMAL HIGH (ref 70–99)
Potassium: 4.6 mmol/L (ref 3.5–5.1)
Sodium: 135 mmol/L (ref 135–145)
Total Bilirubin: 0.7 mg/dL (ref 0.0–1.2)
Total Protein: 5.3 g/dL — ABNORMAL LOW (ref 6.5–8.1)

## 2023-12-05 LAB — LACTATE DEHYDROGENASE: LDH: 258 U/L — ABNORMAL HIGH (ref 98–192)

## 2023-12-05 LAB — URINE CULTURE: Culture: <10000 — AB

## 2023-12-05 LAB — TROPONIN I (HIGH SENSITIVITY)
Troponin I (High Sensitivity): 87 ng/L — ABNORMAL HIGH (ref <18)
Troponin I (High Sensitivity): 31 ng/L — ABNORMAL HIGH (ref <18)

## 2023-12-05 LAB — MAGNESIUM: Magnesium: 2.3 mg/dL (ref 1.7–2.4)

## 2023-12-05 MED ORDER — SODIUM CHLORIDE 0.9 % IV SOLN
2.0000 g | INTRAVENOUS | Status: AC
  Administered 2023-12-05 – 2023-12-07 (×3): 2 g via INTRAVENOUS
  Filled 2023-12-05 (×3): qty 20

## 2023-12-05 MED ORDER — MORPHINE SULFATE (PF) 2 MG/ML IV SOLN
2.0000 mg | INTRAVENOUS | Status: DC | PRN
  Administered 2023-12-05 – 2023-12-06 (×2): 2 mg via INTRAVENOUS
  Filled 2023-12-05: qty 1

## 2023-12-05 MED ORDER — MORPHINE SULFATE (PF) 2 MG/ML IV SOLN
INTRAVENOUS | Status: AC
Start: 2023-12-05 — End: 2023-12-06
  Filled 2023-12-05: qty 1

## 2023-12-05 MED ORDER — LIDOCAINE HCL (PF) 1 % IJ SOLN
INTRAMUSCULAR | Status: AC
  Administered 2023-12-05: 10 mL
  Filled 2023-12-05: qty 30

## 2023-12-05 MED ORDER — MORPHINE SULFATE (PF) 2 MG/ML IV SOLN
INTRAVENOUS | Status: AC
  Administered 2023-12-05: 2 mg via INTRAVENOUS
  Filled 2023-12-05: qty 1

## 2023-12-05 MED ORDER — PERFLUTREN LIPID MICROSPHERE
1.0000 mL | INTRAVENOUS | Status: AC | PRN
  Administered 2023-12-05: 4 mL via INTRAVENOUS

## 2023-12-05 MED ORDER — NITROGLYCERIN 0.4 MG SL SUBL: 0.4000 mg | SUBLINGUAL_TABLET | SUBLINGUAL | Status: DC | PRN

## 2023-12-05 NOTE — Consult Note (Signed)
 Cardiology Consultation   Patient ID: Lisa [name] MRN: [id number]; DOB: [birth year]  Admit date: 12/04/2023 Date of Consult: 12/05/2023  PCP:  [name], DO   [facility] Providers Cardiologist:  Ola Berger, MD  Electrophysiologist:  Manya Sells, MD       Patient Profile:   Lisa [name] is a 83 y.o. female with a hx of hypertension, hypothyroidism, anemia, VT/VF arrest, heart failure, CML, obesity, pleural effusion with venous stasis, lymphedema who is being seen 12/05/2023 for the evaluation of VT at the request of [facility].  History of Present Illness:   Lisa [name] presented to the emergency room yesterday with flank pain for several days.  She was previously seen at urgent care with a diagnosis of a UTI.  Urine cultures were positive for E. coli.  With her flank pain, she had decreased p.o. intake with nausea.  While in the emergency room, she went into ventricular tachycardia.  Her ICD fired 3 times.  She was moved to the ICU and again went into ventricular fibrillation with ICD firing.  She was started on IV amiodarone .  Since then, she has done well.  She has no chest pain or shortness of breath.  She has had no further ventricular tachycardia.   Past Medical History:  Diagnosis Date   Acute on chronic systolic heart failure (HCC)    Acute renal failure superimposed on stage 3b chronic kidney disease (HCC) 05/22/2022   Bacteremia due to Pseudomonas 06/16/2022   CAD in native artery 2017   stent to LAD   Cardiac arrest [facility]) 09/2015   CHF (congestive heart failure) (HCC)    CML (chronic myelocytic leukemia) (HCC)    Complication of anesthesia    Hx: UTI (urinary tract infection)    Hypertension    Melanoma (HCC)    PONV (postoperative nausea and vomiting)    PVC (premature ventricular contraction)    HISTORY OF   Thyroid  disease    V tach (HCC)     Past Surgical History:  Procedure Laterality Date   ABDOMINAL HYSTERECTOMY      APPENDECTOMY     CARDIAC CATHETERIZATION N/A 10/03/2015   Procedure: Left Heart Cath and Coronary Angiography;  Surgeon: Peter M Swaziland, MD;  Location: [facility] INVASIVE CV LAB;  Service: Cardiovascular;  Laterality: N/A;   CARDIAC CATHETERIZATION N/A 10/07/2015   Procedure: Coronary Stent Intervention;  Surgeon: Odie Benne, MD;  Location: [facility] INVASIVE CV LAB;  Service: Cardiovascular;  Laterality: N/A;   CARDIAC CATHETERIZATION  09/22/2019   EP IMPLANTABLE DEVICE N/A 01/20/2016   Procedure: BiV ICD Insertion CRT-D;  Surgeon: Tammie Fall, MD;  Location: [facility] INVASIVE CV LAB;  Service: Cardiovascular;  Laterality: N/A;   IR THORACENTESIS ASP PLEURAL SPACE W/IMG GUIDE  07/20/2023   IR THORACENTESIS ASP PLEURAL SPACE W/IMG GUIDE  12/02/2023   LEFT HEART CATH AND CORONARY ANGIOGRAPHY N/A 09/22/2019   Procedure: LEFT HEART CATH AND CORONARY ANGIOGRAPHY;  Surgeon: Swaziland, Peter M, MD;  Location: [facility] INVASIVE CV LAB;  Service: Cardiovascular;  Laterality: N/A;   TONSILLECTOMY     TONSILLECTOMY     TUBAL LIGATION       Home Medications:  Prior to Admission medications   Medication Sig Start Date End Date Taking? Authorizing Provider  allopurinol  (ZYLOPRIM ) 100 MG tablet Take 100 mg by mouth daily.  06/15/17   [provider]  aluminum hydroxide-magnesium  carbonate (GAVISCON) 95-358 MG/15ML SUSP Take 30 mLs by mouth as needed for  indigestion or heartburn.    [provider]  amiodarone  (PACERONE ) 200 MG tablet Take 1 tablet (200 mg total) by mouth daily. Patient taking differently: Take 200 mg by mouth at bedtime. 07/27/23   Tammie Fall, MD  aspirin  EC 81 MG tablet Take 81 mg by mouth in the morning.    [provider]  atorvastatin  (LIPITOR) 20 MG tablet Take 1 tablet (20 mg total) by mouth daily. 05/07/16   Bensimhon, Rheta Celestine, MD  b complex vitamins capsule Take 1 capsule by mouth daily.    [provider]  calcium  carbonate (OS-CAL - DOSED IN MG OF  ELEMENTAL CALCIUM ) 1250 (500 Ca) MG tablet Take 1 tablet by mouth daily as needed (Calcium  levels).    [provider]  cephALEXin (KEFLEX) 500 MG capsule Take 500 mg by mouth. 12/02/23 12/07/23  [provider]  Cholecalciferol  (VITAMIN D -3 PO) Take 5,000 Units by mouth daily.    [provider]  Cyanocobalamin (VITAMIN B12 SL) Place 1 tablet under the tongue daily.    [provider]  imatinib  (GLEEVEC ) 400 MG tablet Take 400 mg by mouth at bedtime. 02/16/17   [provider]  MAGNESIUM  GLYCINATE PO Take 1 tablet by mouth at bedtime.    [provider]  Multiple Vitamin (MULTIVITAMIN WITH MINERALS) TABS tablet Take 1 tablet by mouth daily.    [provider]  nitroGLYCERIN  (NITROSTAT ) 0.4 MG SL tablet Place 1 tablet (0.4 mg total) under the tongue every 5 (five) minutes x 3 doses as needed for chest pain. 08/21/23   Arrien, Curlee Doss, MD  thyroid  (ARMOUR) 15 MG tablet Take 15 mg by mouth daily before breakfast.     [provider]  torsemide  (DEMADEX ) 20 MG tablet Take 1 tablet (20 mg total) by mouth daily. 09/13/23   Elmyra Haggard, MD  vitamin C  (ASCORBIC ACID ) 500 MG tablet Take 500 mg by mouth daily.    [provider]    Inpatient Medications: Scheduled Meds:  atorvastatin   20 mg Oral Daily   cephALEXin  500 mg Oral Q12H   Chlorhexidine  Gluconate Cloth  6 each Topical Daily   furosemide   40 mg Intravenous BID   heparin   5,000 Units Subcutaneous Q8H   imatinib   400 mg Oral QHS   thyroid   15 mg Oral QAC breakfast   Continuous Infusions:  amiodarone  30 mg/hr (12/05/23 0800)   PRN Meds: acetaminophen  **OR** acetaminophen , alum & mag hydroxide-simeth, ondansetron  (ZOFRAN ) IV, mouth rinse, polyethylene glycol  Allergies:    Allergies  Allergen Reactions   Flagyl [Metronidazole] Itching, Rash and Other (See Comments)    Welts, also   Penicillins Hives, Itching and Rash    Has patient had a PCN reaction  causing immediate rash, facial/tongue/throat swelling, SOB or lightheadedness with hypotension: Yes  Has patient had a PCN reaction causing severe rash involving mucus membranes or skin necrosis: Yes Has patient had a PCN reaction that required hospitalization No Has patient had a PCN reaction occurring within the last 10 years: NO If all of the above answers are "NO", then may proceed with Cephalosporin use.    Sulfamethoxazole Rash   Dilaudid  [Hydromorphone  Hcl] Nausea And Vomiting   Meperidine Nausea And Vomiting   Propoxyphene     Other reaction(s): Unknown   Tape Other (See Comments)    "THE PLASTIC, CLEAR TAPE CAN/DOES PULL OFF MY SKIN."   Doxycycline  Nausea Only, Rash and Other (See Comments)    Made her "  feel terrible"   Lanolin Itching and Rash   Sulfa Antibiotics Rash    Social History:   Social History   Socioeconomic History   Marital status: Divorced    Spouse name: Not on file   Number of children: Not on file   Years of education: Not on file   Highest education level: Not on file  Occupational History   Not on file  Tobacco Use   Smoking status: Never   Smokeless tobacco: Never  Vaping Use   Vaping status: Never Used  Substance and Sexual Activity   Alcohol use: No    Alcohol/week: 0.0 standard drinks of alcohol   Drug use: No   Sexual activity: Not on file  Other Topics Concern   Not on file  Social History Narrative   Not on file   Social Drivers of Health   Financial Resource Strain: Low Risk  (10/28/2022)   Received from [facility]   Overall Financial Resource Strain (CARDIA)    Difficulty of Paying Living Expenses: Not hard at all  Food Insecurity: No Food Insecurity (08/09/2023)   Hunger Vital Sign    Worried About Running Out of Food in the Last Year: Never true    Ran Out of Food in the Last Year: Never true  Transportation Needs: No Transportation Needs (08/09/2023)   PRAPARE - Administrator, Civil Service  (Medical): No    Lack of Transportation (Non-Medical): No  Physical Activity: Not on file  Stress: Not on file  Social Connections: Moderately Isolated (08/09/2023)   Social Connection and Isolation Panel [NHANES]    Frequency of Communication with Friends and Family: More than three times a week    Frequency of Social Gatherings with Friends and Family: Twice a week    Attends Religious Services: Never    Database administrator or Organizations: Yes    Attends Engineer, structural: More than 4 times per year    Marital Status: Divorced  Intimate Partner Violence: Not At Risk (08/09/2023)   Humiliation, Afraid, Rape, and Kick questionnaire    Fear of Current or Ex-Partner: No    Emotionally Abused: No    Physically Abused: No    Sexually Abused: No    Family History:    Family History  Problem Relation Age of Onset   Coronary artery disease Mother    Thyroid  disease Mother    Multiple myeloma Father    Heart attack Maternal Grandfather    Heart attack Paternal Grandfather      ROS:  Please see the history of present illness.   All other ROS reviewed and negative.     Physical Exam/Data:   Vitals:   12/05/23 0500 12/05/23 0600 12/05/23 0700 12/05/23 0714  BP: (!) 101/50 (!) 132/54 133/62   Pulse: 60 65 73   Resp: 16 18 20    Temp:    98.3 F (36.8 C)  TempSrc:    Oral  SpO2: 100% 100% 100%   Weight:      Height:        Intake/Output Summary (Last 24 hours) at 12/05/2023 0817 Last data filed at 12/05/2023 0800 Gross per 24 hour  Intake 702.56 ml  Output 550 ml  Net 152.56 ml      12/04/2023   10:48 AM 11/01/2023   11:48 AM 08/21/2023    4:59 AM  Last 3 Weights  Weight (lbs) 169 lb 174 lb 176 lb 9.4 oz  Weight (kg) 76.658 kg 78.926 kg 80.1 kg     Body mass index is 31.93 kg/m.  General:  Well nourished, well developed, in no acute distress HEENT: normal Neck: no JVD Vascular: No carotid bruits; Distal pulses 2+ bilaterally Cardiac:  normal S1,  S2; RRR; no murmur  Lungs:  clear to auscultation bilaterally, no wheezing, rhonchi or rales  Abd: soft, nontender, no hepatomegaly  Ext: no edema Musculoskeletal:  No deformities, BUE and BLE strength normal and equal Skin: warm and dry  Neuro:  CNs 2-12 intact, no focal abnormalities noted Psych:  Normal affect   EKG:  The EKG was personally reviewed and demonstrates: Sinus rhythm, ventricular paced Telemetry:  Telemetry was personally reviewed and demonstrates: Sinus rhythm  Relevant CV Studies: TTE 08/09/2023  1. Left ventricular ejection fraction, by estimation, is 55 to 60%. The  left ventricle has normal function. The left ventricle has no regional  wall motion abnormalities. Left ventricular diastolic parameters are  consistent with Grade I diastolic  dysfunction (impaired relaxation).   2. Right ventricular systolic function is normal. The right ventricular  size is normal.   3. Large pleural effusion.   4. The mitral valve is normal in structure. No evidence of mitral valve  regurgitation. No evidence of mitral stenosis.   5. The aortic valve is normal in structure. Aortic valve regurgitation is  not visualized. No aortic stenosis is present.   6. The inferior vena cava is dilated in size with <50% respiratory  variability, suggesting right atrial pressure of 15 mmHg.   Laboratory Data:  High Sensitivity Troponin:   Recent Labs  Lab 12/04/23 1903 12/04/23 2130 12/05/23 0247  TROPONINIHS 75* 114* 87*     Chemistry Recent Labs  Lab 12/04/23 1144 12/04/23 1903 12/05/23 0247  NA 138 137 135  K 4.4 3.3* 4.6  CL 102 102 104  CO2 23 21* 22  GLUCOSE 115* 133* 109*  BUN 17 13 16   CREATININE 1.75* 1.55* 1.49*  CALCIUM  9.2 8.6* 8.2*  MG  --  1.7 2.3  GFRNONAA 29* 33* 35*  ANIONGAP 13 14 9     Recent Labs  Lab 12/04/23 1144 12/04/23 1903 12/05/23 0247  PROT 6.3* 6.4* 5.3*  ALBUMIN  3.8 3.4* 2.8*  AST 29 31 25   ALT 24 23 21   ALKPHOS 78 58 46  BILITOT 0.9  1.2 0.7   Lipids No results for input(s): "CHOL", "TRIG", "HDL", "LABVLDL", "LDLCALC", "CHOLHDL" in the last 168 hours.  Hematology Recent Labs  Lab 12/04/23 1144 12/04/23 1821 12/05/23 0247  WBC 4.7 7.0 5.0  RBC 2.80* 3.18* 2.62*  HGB 8.9* 10.2* 8.2*  HCT 27.1* 30.2* 25.5*  MCV 96.8 95.0 97.3  MCH 31.8 32.1 31.3  MCHC 32.8 33.8 32.2  RDW 15.5 15.8* 15.3  PLT 173 264 169   Thyroid   Recent Labs  Lab 12/04/23 1903  TSH 2.817    BNP Recent Labs  Lab 12/04/23 1144  PROBNP 4,071.0*    DDimer No results for input(s): "DDIMER" in the last 168 hours.   Radiology/Studies:  DG Chest Portable 1 View Result Date: 12/04/2023 CLINICAL DATA:  Shortness of breath. Left-sided thoracentesis two days ago. EXAM: PORTABLE CHEST 1 VIEW COMPARISON:  One-view chest x-ray 12/02/2023 FINDINGS: The heart is enlarged. Atherosclerotic calcifications are present at the aortic arch. Left-sided pneumothorax is improved but incompletely resolved. The right lung is clear. IMPRESSION: 1. Improved but incompletely resolved left-sided pneumothorax. 2. Cardiomegaly without failure. Electronically Signed   By: Veryl Gottron  Mattern M.D.   On: 12/04/2023 16:49   CT Renal Stone Study Result Date: 12/04/2023 CLINICAL DATA:  Abdominal/flank pain, right-sided, history of UTI EXAM: CT ABDOMEN AND PELVIS WITHOUT CONTRAST TECHNIQUE: Multidetector CT imaging of the abdomen and pelvis was performed following the standard protocol without IV contrast. RADIATION DOSE REDUCTION: This exam was performed according to the departmental dose-optimization program which includes automated exposure control, adjustment of the mA and/or kV according to patient size and/or use of iterative reconstruction technique. COMPARISON:  08/08/2023 FINDINGS: Lower chest: Large hydropneumothorax at the left lung base. Transvenous pacing leads. Hepatobiliary: No focal liver abnormality is seen. No gallstones, gallbladder wall thickening, or biliary  dilatation. Pancreas: Unremarkable. No pancreatic ductal dilatation or surrounding inflammatory changes. Spleen: Normal in size without focal abnormality. Adrenals/Urinary Tract: No definite adrenal mass. Symmetric renal contours. Right kidney is ptotic. No urolithiasis or hydronephrosis. Urinary bladder partially distended. Stomach/Bowel: Stomach is nondistended, without acute finding. Small bowel decompressed. Colon unremarkable. Vascular/Lymphatic: Extensive aortoiliac calcified plaque without AAA. No evident abdominal or pelvic adenopathy. Reproductive: Status post hysterectomy. No adnexal masses. Other: Trace free fluid in the pelvis. No abdominal ascites. No free air. Musculoskeletal: Mild spondylitic changes in the mid lumbar spine. Osteitis pubis. IMPRESSION: 1. Large left hydropneumothorax. Critical Value/emergent results were called by telephone at the time of interpretation on 12/04/2023 at 12:24 pm to provider [facility] , who verbally acknowledged these results. 2. No acute findings in the abdomen or pelvis. 3.  Aortic Atherosclerosis (ICD10-I70.0). Electronically Signed   By: Nicoletta Barrier M.D.   On: 12/04/2023 12:25   IR THORACENTESIS ASP PLEURAL SPACE W/IMG GUIDE Result Date: 12/03/2023 INDICATION: Patient with history of CML, CHF, left pleural effusion. IR consulted for therapeutic left thoracentesis. EXAM: ULTRASOUND GUIDED THERAPEUTIC LEFT THORACENTESIS MEDICATIONS: 10 mL 1% lidocaine  COMPLICATIONS: Patient tolerated procedure well with no immediate complication. On post-op CXR, findings show lucency in the left base. Correlate with under-expanded lung. (Pneumothorax ex vacuo). No lung markings are seen in the left apex either. But the line of a pneumothorax is not clearly defined. Findings were reviewed with Dr. Marne Sings who believes this is likely due to a stuck lung and under expansion. Does recommend patient not receive future thoracentesis as she cannot tolerate from today's findings - pt's  care team made aware. Pt remains feeling well, Vitals 98% on RA, HR 70, BP 157/73 PROCEDURE: An ultrasound guided thoracentesis was thoroughly discussed with the patient and questions answered. The benefits, risks, alternatives and complications were also discussed. The patient understands and wishes to proceed with the procedure. Written consent was obtained. Ultrasound was performed to localize and mark an adequate pocket of fluid in the left chest. The area was then prepped and draped in the normal sterile fashion. 1% Lidocaine  was used for local anesthesia. Under ultrasound guidance a 6 Fr Safe-T-Centesis catheter was introduced. Thoracentesis was performed. The catheter was removed and a dressing applied. FINDINGS: A total of approximately 1.45 L of yellow fluid was removed. IMPRESSION: Successful ultrasound guided left thoracentesis yielding 1.45 L of pleural fluid. Performed by: Wyatt Pommier, PA-C Electronically Signed   By: Fernando Hoyer M.D.   On: 12/03/2023 12:57   DG Chest 1 View Result Date: 12/02/2023 CLINICAL DATA:  Shortness of breath post thoracentesis EXAM: CHEST  1 VIEW COMPARISON:  August 17, 2023 chest x-ray FINDINGS: Lucency in the left base. Correlate with underexpanded lung.) (Pneumothorax ex vacuo). No lung markings are seen in the left apex either. But the line of a  pneumothorax is not clearly defined. Right lung clear Heart normal size No change in the left subclavian bipolar ICDF pacemaker device tip of the leads in good position no evidence of discontinuity IMPRESSION: Lucency in the left base. Correlate with underexpanded lung. (Pneumothorax ex vacuo). No lung markings are seen in the left apex either. But the line of a pneumothorax is not clearly defined. Electronically Signed   By: Fredrich Jefferson M.D.   On: 12/02/2023 14:41   CUP PACEART REMOTE DEVICE CHECK Result Date: 12/02/2023 Monthly battery check.  RRT on 10/23/23. Previously reported.  Gen change scheduled on 01/07/24  per Epic. Continue monthly battery checks per IFU until generator change per protocol. Normal device function. No new alerts. Follow up as scheduled monthly AB,CVRS Monthly battery check.  RRT on 10/23/23. Previously reported.  Gen change scheduled on 01/07/24 per Epic. Continue monthly battery checks per IFU until generator change per protocol. Normal device function. No new alerts. HF diagnostics abnormal during this monitoring period. Follow up as scheduled monthly AB,CVRS    Assessment and Plan:   VT/VF: Patient had previously felt well but presented with acute flank pain in the setting of a UTI.  She is on IV amiodarone  and has not had any further ventricular tachycardia.  For now, we Dellas Guard continue IV amiodarone .  May be able to stop IV and switch to p.o. tomorrow.  Arrhythmias may be in the setting of generalized inflammation from UTI.  She has been stable on amiodarone  and has not had ventricular arrhythmias in quite some time. Chronic systolic heart failure: No obvious volume overload.  Her battery is ERI.  Verdine Grenfell need to check her ICD tomorrow for battery status. UTI: Continue Keflex per primary team.  Blood cultures pending. Pneumothorax: Plan per primary team.  Chest CT pending Acute on chronic CKD stage IIIb: Per primary team   Risk Assessment/Risk Scores:      For questions or updates, please contact [facility] Please consult [email or web address] for contact info under    Signed, Asir Bingley Cortland Ding, MD  12/05/2023 8:17 AM

## 2023-12-05 NOTE — Hospital Course (Signed)
 Lisa [name] 83 y.o. female with past medical history significant of hypertension, hypothyroidism, anemia, V. tach/V-fib, cardiac arrest, CHF, CML, obesity, recurrent pleural effusion, venous stasis, lymphedema presented to hospital with flank pain for several days especially on the right side.  She had gone to urgent care center and was diagnosed of UTI and urine culture showed E. coli.  She was started on Keflex.  She also had outpatient therapeutic thoracocentesis.  Due to worsening flank pain patient presented to the ED.  In the ED labs showed creatinine of 1.7 with hemoglobin of 8.9.  No leukocytosis.  BNP still significantly elevated to 4071.  Urinalysis with ketones and protein.  Chest x-ray showed large pneumothorax and cardiomegaly.  CT scanning of the abdomen showed large left hydropneumothorax.  Pulmonary was consulted in the ED and was also noted to have V. tach with firing of her implanted ICD.  Patient was then started on amiodarone  drip and was admitted to the ICU.    Significant events. 4/26.  Presented with flank pain nausea decreased intake.  CT with large left hydropneumothorax. 4/270-pigtail chest catheterization 12/05/2023  Recurrent ventricular tachycardia Runs of V. tach with implanted defibrillator.  Electrophysiology Cardiology was consulted.  On amiodarone  drip.  Patient is tentatively scheduled for generator change on 01/07/2024.   UTI Diagnosed with E. coli UTI urgent care few days ago.  Was on Keflex.  Was changed to ceftriaxone  during hospitalization.  Blood cultures negative in 2 days.  No leukocytosis.  Urine culture with insignificant growth.  Pneumothorax ex vacuo History of chronic pleural effusion with 3 thoracocentesis.  Patient likely developed pneumothorax ex vacuo following thoracentesis for left-sided pleural effusion.   Suspect that the lung is trapped and the lung is not fully expanded, likely due to inflammation and scarring from prolonged effusion.  Currently, the pneumothorax is not the primary concern given the cardiac issues.  Patient underwent left-sided pigtail chest tube catheterization and suctioning.  Pulmonary following for chest tube management  Chronic combined CHF.  Continue IV Lasix  twice daily.  Strict intake and output charting Daily weights..   AKI on CKD stage IIIb Baseline serum creatinine is around 1.5.  Creatinine today at 1.8.  Will continue to monitor.   History of CML Continue Gleevec .  Check CBC closely.  No leukocytosis.   Hypothyroidism Continue Armour 15 mg daily

## 2023-12-05 NOTE — Plan of Care (Signed)
  Problem: Health Behavior/Discharge Planning: Goal: Ability to manage health-related needs will improve Outcome: Progressing   Problem: Clinical Measurements: Goal: Ability to maintain clinical measurements within normal limits will improve Outcome: Progressing   Problem: Nutrition: Goal: Adequate nutrition will be maintained Outcome: Progressing   Problem: Coping: Goal: Level of anxiety will decrease Outcome: Progressing   Problem: Elimination: Goal: Will not experience complications related to bowel motility Outcome: Progressing Goal: Will not experience complications related to urinary retention Outcome: Progressing

## 2023-12-05 NOTE — Progress Notes (Signed)
 Echocardiogram 2D Echocardiogram has been performed.  Lisa [name] 12/05/2023, 10:37 AM

## 2023-12-05 NOTE — H&P (Addendum)
 NAME:  Lisa [name], MRN:  [id number], DOB:  11-14-1940, LOS: 1 ADMISSION DATE:  12/04/2023, CONSULTATION DATE:  12/04/2023 REFERRING MD:  Dr. Rufina Cough - TR[facility], CHIEF COMPLAINT:  Pleural effusion    History of Present Illness:  Lisa [name] is a 83 y.o. female with a past medical history significant for CHF, CAD, arrhythmias including V. tach, hypertension,, CML, and melanoma who presented to the ED ED at [facility] 4/26 for complaints of progressive right-sided flank and back pain that began 4 to 5 days prior to admission.  Of note patient underwent thoracentesis per interventional radiology 2 days prior to admission for large pleural effusion, 1500 mL were removed.  Appears patient had ex vacuo pneumo post thoracentesis.  I do not see in our system to see that any samples/cytology was obtained during thoracentesis.  She also had an urgent care visit at [facility] on 4/24 for similar complaints of back pain, flank pain and nausea where she was diagnosed with E. coli UTI and started on Keflex.    Patient was admitted per hospitalist with pulmonary critical care consulted for management of hydro pneumothorax On arrival to [facility] patient went into repeated V. tach with firing of her implanted ICD.  She was transferred to ICU, started on amiodarone  infusion  Pertinent  Medical History  CHF, CAD, arrhythmias including V. tach, hypertension,, CML, and melanoma  Significant Hospital Events: Including procedures, antibiotic start and stop dates in addition to other pertinent events   4/26 presented with progressive right sided flank pain with nausea and decreased oral intake.  CT renal study with large left hydropneumothorax   Interim History / Subjective:  No more episodes of V. tach on amiodarone  Seen by EP recommend continuing amiodarone  IV infusion Stated feeling better  Objective   Blood pressure (!) 123/54, pulse 69, temperature 97.7 F (36.5 C), temperature source Oral,  resp. rate (!) 21, height 5\' 1"  (1.549 m), weight 76.7 kg, last menstrual period 02/14/1995, SpO2 100%.        Intake/Output Summary (Last 24 hours) at 12/05/2023 1203 Last data filed at 12/05/2023 1000 Gross per 24 hour  Intake 735.94 ml  Output 550 ml  Net 185.94 ml   Filed Weights   12/04/23 1048  Weight: 76.7 kg   Examination: General: Chronically ill-appearing female, lying on the bed HEENT: [place]/AT, eyes anicteric.  moist mucus membranes Neuro: Alert, awake following commands Chest: Reduced air entry on left side of the lung Heart: Regular rate and rhythm, no murmurs or gallops Abdomen: Soft, nontender, nondistended, bowel sounds present Skin: No rash  Lab/imaging reviewed BUN/creatinine 16/1.5, otherwise within normal limits Hemoglobin 8.2, platelets 169  CT renal 12/04/2023-large left hydro pneumothorax, no acute findings in the abdomen or pelvis Chest x-ray 12/04/2023-pneumothorax ex vacuo on the left. CT chest: Reviewed myself showing left-sided hydropneumothorax with trapped lung  Resolved Hospital Problem list     Assessment & Plan:  Recurrent ventricular tachycardia Experienced a dizzy spell with runs of ventricular tachycardia on the monitor on arrival at [facility].  Implanted defibrillator has gone on several times Appreciate EP cardiology follow-up Continue amiodarone  IV infusion Closely monitor and supplement electrolytes Avoid QT prolonging medications, except amiodarone  which is keeping her in sinus rhythm  UTI Diagnosed with E. coli UTI urgent care few days ago On Keflex, switched to ceftriaxone  Denies flank pain today UA is negative  Pneumothorax ex vacuo Pneumothorax ex vacuo following thoracentesis for left-sided pleural effusion.  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  Suspect that the lung is trapped and the lung is not fully expanded, likely due to inflammation and scarring from prolonged effusion. Currently,  the pneumothorax is not the primary concern given the cardiac issues. 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  Monitor CBC  Hypothyroidism Continue Armour 15 mg daily  Best Practice (right click and "Reselect all SmartList Selections" daily)  Diet/type: Regular consistency DVT prophylaxis: Subcu heparin  GI prophylaxis: N/A Lines: N/A Foley: N/A Code Status:  full code Last date of multidisciplinary goals of care discussion: 4/27 patient was updated at bedside   Labs   CBC: Recent Labs  Lab 12/04/23 1144 12/04/23 1821 12/05/23 0247  WBC 4.7 7.0 5.0  NEUTROABS 3.6  --   --   HGB 8.9* 10.2* 8.2*  HCT 27.1* 30.2* 25.5*  MCV 96.8 95.0 97.3  PLT 173 264 169    Basic Metabolic Panel: Recent Labs  Lab 12/04/23 1144 12/04/23 1903 12/05/23 0247  NA 138 137 135  K 4.4 3.3* 4.6  CL 102 102 104  CO2 23 21* 22  GLUCOSE 115* 133* 109*  BUN 17 13 16   CREATININE 1.75* 1.55* 1.49*  CALCIUM  9.2 8.6* 8.2*  MG  --  1.7 2.3   GFR: Estimated Creatinine Clearance: 27.8 mL/min (A) (by C-G formula based on SCr of 1.49 mg/dL (H)). Recent Labs  Lab 12/04/23 1144 12/04/23 1821 12/05/23 0247  WBC 4.7 7.0 5.0    Liver Function Tests: Recent Labs  Lab 12/04/23 1144 12/04/23 1903 12/05/23 0247  AST 29 31 25   ALT 24 23 21   ALKPHOS 78 58 46  BILITOT 0.9 1.2 0.7  PROT 6.3* 6.4* 5.3*  ALBUMIN  3.8 3.4* 2.8*   No results for input(s): "LIPASE", "AMYLASE" in the last 168 hours. No results for input(s): "AMMONIA" in the last 168 hours.  ABG    Component Value Date/Time   HCO3 26.8 08/09/2023 0427   TCO2 27 08/08/2023 1709   ACIDBASEDEF 1.0 08/09/2023 0427   O2SAT 95.9 08/09/2023 0427      Coagulation Profile: No results for input(s): "INR", "PROTIME" in the last 168 hours.  Cardiac Enzymes: No results for input(s): "CKTOTAL", "CKMB", "CKMBINDEX", "TROPONINI" in the last 168 hours.  HbA1C: Hgb A1c MFr Bld  Date/Time Value Ref Range Status  07/17/2023 03:46 PM 5.4 4.8 - 5.6 % Final    Comment:    (NOTE) Pre diabetes:          5.7%-6.4%  Diabetes:              >6.4%  Glycemic control for   <7.0% adults with diabetes   10/03/2015 11:42 PM 6.2 (H) 4.8 - 5.6 % Final    Comment:    (NOTE)         Pre-diabetes: 5.7 - 6.4         Diabetes: >6.4         Glycemic control for adults with diabetes: <7.0     CBG: No results for input(s): "GLUCAP" in the last 168 hours.     Trevor Fudge, MD [facility] See Amion for pager If no response to pager, please call  [phone] until 7pm After 7pm, Please call E-link [phone]

## 2023-12-05 NOTE — Procedures (Signed)
 Insertion of Chest Tube Procedure Note  Lisa [name]  [id number]  02-10-1941  Date:12/05/23  Time:2:53 PM    Provider Performing: Trevor Fudge   Procedure: Pleural Catheter Insertion w/ Imaging Guidance (40981)  Indication(s) Effusion  Consent Risks of the procedure as well as the alternatives and risks of each were explained to the patient and/or caregiver.  Consent for the procedure was obtained and is signed in the bedside chart  Anesthesia Topical only with 1% lidocaine     Time Out Verified patient identification, verified procedure, site/side was marked, verified correct patient position, special equipment/implants available, medications/allergies/relevant history reviewed, required imaging and test results available.   Sterile Technique Maximal sterile technique including full sterile barrier drape, hand hygiene, sterile gown, sterile gloves, mask, hair covering, sterile ultrasound probe cover (if used).   Procedure Description Ultrasound used to identify appropriate pleural anatomy for placement and overlying skin marked. Area of placement cleaned and draped in sterile fashion.  A 14French pigtail pleural catheter was placed into the left pleural space using Seldinger technique. Appropriate return of fluid was obtained.  The tube was connected to [facility] and placed on -20 cm H2O wall suction.   Complications/Tolerance None; patient tolerated the procedure well. Chest X-ray is ordered to verify placement.   EBL Minimal  Specimen(s) fluid

## 2023-12-05 NOTE — Progress Notes (Addendum)
 eLink Physician-Brief Progress Note Patient Name: Lisa [name] DOB: Nov 27, 1940 MRN: [id number]   Date of Service  12/05/2023  HPI/Events of Note  Notified of patient complained ot left sided chest pain radiating down her left arm.  The patient has a new pigtail cath that was placed earlier in the day on the left chest.   The patient reports that this pain is different from admission.   EKG done - showing atrial-sensed V paced rhythm, rate 73  eICU Interventions  Give morphine 2mg  IV now.  Check troponin and chest xray now.      Intervention Category Intermediate Interventions: Pain - evaluation and management  Lanell Pinta 12/05/2023, 9:03 PM  9:22 PM Pt has had 1L removed since chest tube was inserted.  Pt is on minimal O2 and saturating adequately.   BP 132/53, HR 75, RR 24, O2 sats 100%.  Plan> Hold further draining from the chest tube.  We will get troponin and CXR.   10:24 PM CXR shows improvement in pneumothorax.  Plan> Follow up troponin.  10:53 PM Troponin at 31 <-- 87.  Plan> Continue clamping chest tube.  3:56 AM Pt complaining of nausea.  Qtc 537.   Plan> Discontinue zofran .  Give compazine  PRN.

## 2023-12-06 ENCOUNTER — Inpatient Hospital Stay (HOSPITAL_COMMUNITY)

## 2023-12-06 DIAGNOSIS — J9819 Other pulmonary collapse: Secondary | ICD-10-CM

## 2023-12-06 DIAGNOSIS — J948 Other specified pleural conditions: Secondary | ICD-10-CM | POA: Diagnosis not present

## 2023-12-06 DIAGNOSIS — Z4682 Encounter for fitting and adjustment of non-vascular catheter: Secondary | ICD-10-CM | POA: Diagnosis not present

## 2023-12-06 DIAGNOSIS — J9601 Acute respiratory failure with hypoxia: Secondary | ICD-10-CM | POA: Diagnosis not present

## 2023-12-06 DIAGNOSIS — J9383 Other pneumothorax: Secondary | ICD-10-CM | POA: Diagnosis not present

## 2023-12-06 LAB — CBC
HCT: 25.3 % — ABNORMAL LOW (ref 36.0–46.0)
Hemoglobin: 7.9 g/dL — ABNORMAL LOW (ref 12.0–15.0)
MCH: 31 pg (ref 26.0–34.0)
MCHC: 31.2 g/dL (ref 30.0–36.0)
MCV: 99.2 fL (ref 80.0–100.0)
Platelets: 165 10*3/uL (ref 150–400)
RBC: 2.55 MIL/uL — ABNORMAL LOW (ref 3.87–5.11)
RDW: 15.4 % (ref 11.5–15.5)
WBC: 5.4 10*3/uL (ref 4.0–10.5)
nRBC: 0 % (ref 0.0–0.2)

## 2023-12-06 LAB — BASIC METABOLIC PANEL WITH GFR
Anion gap: 10 (ref 5–15)
BUN: 15 mg/dL (ref 8–23)
CO2: 23 mmol/L (ref 22–32)
Calcium: 8.1 mg/dL — ABNORMAL LOW (ref 8.9–10.3)
Chloride: 103 mmol/L (ref 98–111)
Creatinine, Ser: 1.81 mg/dL — ABNORMAL HIGH (ref 0.44–1.00)
GFR, Estimated: 28 mL/min — ABNORMAL LOW (ref >60)
Glucose, Bld: 122 mg/dL — ABNORMAL HIGH (ref 70–99)
Potassium: 4.3 mmol/L (ref 3.5–5.1)
Sodium: 136 mmol/L (ref 135–145)

## 2023-12-06 LAB — MAGNESIUM: Magnesium: 2.2 mg/dL (ref 1.7–2.4)

## 2023-12-06 MED ORDER — AMIODARONE HCL 200 MG PO TABS
200.0000 mg | ORAL_TABLET | Freq: Two times a day (BID) | ORAL | Status: AC
  Administered 2023-12-11 – 2023-12-15 (×10): 200 mg via ORAL
  Filled 2023-12-06 (×10): qty 1

## 2023-12-06 MED ORDER — PROCHLORPERAZINE EDISYLATE 10 MG/2ML IJ SOLN
10.0000 mg | Freq: Four times a day (QID) | INTRAMUSCULAR | Status: DC | PRN
  Administered 2023-12-06 – 2023-12-16 (×8): 10 mg via INTRAVENOUS
  Filled 2023-12-06 (×10): qty 2

## 2023-12-06 MED ORDER — AMIODARONE HCL 200 MG PO TABS
400.0000 mg | ORAL_TABLET | Freq: Two times a day (BID) | ORAL | Status: AC
  Administered 2023-12-06 – 2023-12-10 (×10): 400 mg via ORAL
  Filled 2023-12-06 (×10): qty 2

## 2023-12-06 NOTE — Progress Notes (Signed)
 Assessment complete, see flowsheet. Continues to refuse removal of L forearm PIV although it appears to be malpositioned. States "I don't want it taken out because I don't want to be stuck again." Educated on the presence of an additional IV site and need to remove for comfort and potential skin damage. Verbalizes understanding.

## 2023-12-06 NOTE — Progress Notes (Addendum)
 Patient Name: Lisa [name] Date of Encounter: 12/06/2023  Primary Cardiologist: Ola Berger, MD Electrophysiologist: Manya Sells, MD  Interval Summary   The patient is doing well today.  At this time, the patient denies chest pain, shortness of breath, or any new concerns.  Vital Signs    Vitals:   12/06/23 0545 12/06/23 0600 12/06/23 0615 12/06/23 0630  BP: (!) 111/43 (!) 110/44 (!) 118/52   Pulse: 63 63 71 69  Resp: 16 14 20  (!) 27  Temp:      TempSrc:      SpO2: 100% 100% 100% 100%  Weight:      Height:        Intake/Output Summary (Last 24 hours) at 12/06/2023 0739 Last data filed at 12/06/2023 0600 Gross per 24 hour  Intake 853.68 ml  Output 1375 ml  Net -521.32 ml   Filed Weights   12/04/23 1048 12/06/23 0500  Weight: 76.7 kg 76.4 kg    Physical Exam    GEN- The patient is well appearing, alert and oriented x 3 today.   Lungs- Clear to ausculation bilaterally, normal work of breathing Cardiac- Regular rate and rhythm, no murmurs, rubs or gallops GI- soft, NT, ND, + BS Extremities- no clubbing or cyanosis. No edema  Telemetry    V pacing 60s, no further VT (personally reviewed)  Hospital Course    Lisa [name] is a 83 y.o. female with h/o hypertension, hypothyroidism, anemia, VT/VF arrest, heart failure, CML, obesity, pleural effusion with venous stasis, lymphedema who is being seen 12/05/2023 for the evaluation of VT at the request of Sudham Chand   Assessment & Plan    VT/VF Likely in setting of acute issues include UTI And pleural effusion -> pneumothorax. No further on IV amiodarone .  Finish current bag, then Carolyne Whitsel transition to po at 400 mg BID x 5 days, then 200 mg BID until follow up.   Battery has previously been noted as ERI as of 10/23/2023. She is tentatively scheduled for Gen change 01/07/2024. Kavion Mancinas double check battery today given shocks.   Chronic combined CHF Volume status improving on low dose IV lasix .  Timing of transition  to po per primary  Pneumothorax ex vacuo After thoracentesis with left sided effusion, had prior chronic effusion since 07/2023 requiring multiple thoracentesis.  Has left-sided pigtail chest tube in place.  Per Pulm.   EP Leauna [name] continue to follow along.   For questions or updates, please contact [facility] Please consult [email or web address] for contact info under Cardiology/STEMI.  Signed, Tylene Galla, PA-C  12/06/2023, 7:39 AM   I have seen and examined this patient with Michaelle Adolphus.  Agree with above, note added to reflect my findings.  Patient feeling well this morning.  No further ventricular arrhythmias.  No chest pain or shortness of breath.  Having pain in her right calf.  GEN: No acute distress.   Neck: No JVD Cardiac: RRR, no murmurs, rubs, or gallops.  Respiratory: normal BS bases bilaterally. GI: Soft, nontender, non-distended  MS: No edema; No deformity. Neuro:  Nonfocal  Skin: warm and dry, device site well healed Psych: Normal affect    VT/VF: Has had no further episodes overnight.  On IV amiodarone .  Lisa [name] transition to p.o. amiodarone .  Lisa [name] plan for 400 mg twice daily for 5 days followed by 200 mL grams twice daily until follow-up.  Follow-up Lisa [name] be arranged. Chronic combined heart failure: Volume status improving on low-dose Lasix . Pneumothorax ex vacuo: After thoracentesis with  left-sided effusion.  Plan per primary team.  EP to sign off.  Lisa [name] arrange for follow-up in clinic.  Amiodarone  as above.  Lisa Rinn M. Braxton Vantrease MD 12/06/2023 8:36 AM

## 2023-12-06 NOTE — TOC Initial Note (Signed)
 Transition of Care [facility]) - Initial/Assessment Note    Patient Details  Name: Lisa [name] MRN: [id number] Date of Birth: 1941-03-19  [facility] CM/SW Contact:    Benjiman Bras, RN Phone Number: [phone] 12/06/2023, 2:14 PM  Clinical Narrative:                  TOC CM spoke to pt and states she is independent pta. She has caregiver in the home 3x per week. She uses [employer] at home. She has bedside commode and rail on her bed. She has a Advertising copywriter. She is going to outpt PT at [facility] and does not want HH. She had [facility] in the past. She will use [employer] to transport home.   Expected Discharge Plan: Home/Self Care Barriers to Discharge: Continued Medical Work up   Patient Goals and CMS Choice Patient states their goals for this hospitalization and ongoing recovery are:: wants to remain independent CMS Medicare.gov Compare Post Acute Care list provided to:: Patient Choice offered to / list presented to : Patient      Expected Discharge Plan and Services   Discharge Planning Services: CM Consult Post Acute Care Choice: Home Health Living arrangements for the past 2 months: Single Family Home                                      Prior Living Arrangements/Services Living arrangements for the past 2 months: Single Family Home Lives with:: Self Patient language and need for interpreter reviewed:: Yes Do you feel safe going back to the place where you live?: Yes      Need for Family Participation in Patient Care: No (Comment) Care giver support system in place?: Yes (comment) Current home services: DME (cane, rollator) Criminal Activity/Legal Involvement Pertinent to Current Situation/Hospitalization: No - Comment as needed  Activities of Daily Living   ADL Screening (condition at time of admission) Independently performs ADLs?: Yes (appropriate for developmental age) Is the patient deaf or have difficulty hearing?: No Does the patient  have difficulty seeing, even when wearing glasses/contacts?: No Does the patient have difficulty concentrating, remembering, or making decisions?: No  Permission Sought/Granted Permission sought to share information with : Case Manager, Family Supports, PCP Permission granted to share information with : Yes, Verbal Permission Granted  Share Information with NAME: Rosella Conn  Permission granted to share info w AGENCY: Home Health  Permission granted to share info w Relationship: daughter  Permission granted to share info w Contact Information: [phone]  Emotional Assessment Appearance:: Appears stated age Attitude/Demeanor/Rapport: Engaged Affect (typically observed): Accepting Orientation: : Oriented to Self, Oriented to Place, Oriented to  Time, Oriented to Situation   Psych Involvement: No (comment)  Admission diagnosis:  Hydropneumothorax [J94.8] Acute cystitis without hematuria [N30.00] Acute heart failure, unspecified heart failure type [facility]) [I50.9] Patient Active Problem List   Diagnosis Date Noted   Hydropneumothorax 12/04/2023   Obesity, class 1 08/20/2023   Fall at home, initial encounter 08/09/2023   Generalized weakness 08/09/2023   Elevated troponin 08/09/2023   Anemia of chronic disease 08/09/2023   Acute on chronic diastolic heart failure (HCC) 08/08/2023   Cellulitis, leg 07/17/2023   Open wound of right lower extremity 07/17/2023   Respiratory distress 07/17/2023   Recurrent left pleural effusion 07/17/2023   Heart failure with reduced ejection fraction (HCC) 07/17/2023   Chronic kidney disease, stage III (moderate) (HCC)  07/17/2023   Obesity (BMI 30-39.9) 07/17/2023   Urine culture positive 06/19/2022   Transaminitis 06/17/2022   Viral gastroenteritis 06/16/2022   Venous stasis dermatitis of both lower extremities 05/26/2022   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 05/22/2022   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/21/2022   Paroxysmal ventricular  tachycardia (HCC) 09/21/2019   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   Unstable angina (HCC) 10/07/2015   Chronic systolic heart failure (HCC)    PVC's (premature ventricular contractions)    Ventricular fibrillation (HCC) 10/03/2015   History of cardiac arrest 10/03/2015   Essential hypertension 09/09/2015   Lymphedema 02/21/2015   Acquired hypothyroidism 02/21/2015   PCP:  [name], DO Pharmacy:   [facility] [address] [place] - [address] [place] [address] Phone: [phone] Fax: [phone]  [id number] [place] - [address] [place] [address] Phone: [phone] Fax: [phone]  [id number] Jonette Nestle, [place] - [address] AT [facility] OF [facility] [address] [place] [address] Phone: [phone] Fax: [phone]     Social Drivers of Health (SDOH) Social History: SDOH Screenings   Food Insecurity: No Food Insecurity (08/09/2023)  Housing: Low Risk  (08/09/2023)  Transportation Needs: No Transportation Needs (08/09/2023)  Utilities: Not At Risk (08/09/2023)  Financial Resource Strain: Low Risk  (10/28/2022)   [facility] from [facility]  Social Connections: Moderately Isolated (08/09/2023)  Tobacco Use: Low Risk  (12/04/2023)   SDOH Interventions:     Readmission Risk Interventions    08/10/2023    2:10 PM 07/19/2023    3:29 PM  Readmission Risk Prevention Plan  Transportation Screening Complete Complete  PCP or Specialist Appt within 5-7 Days  Complete  PCP or Specialist Appt within 3-5 Days Complete   Home Care Screening  Complete  Medication Review (RN CM)  Complete  HRI or Home Care Consult Complete   Palliative Care Screening Not Applicable   Medication Review (RN Care Manager) Complete

## 2023-12-06 NOTE — Progress Notes (Signed)
 NAME:  Lisa [name], MRN:  [id number], DOB:  Mar 25, 1941, LOS: 2 ADMISSION DATE:  12/04/2023, CONSULTATION DATE:  12/04/2023 REFERRING MD:  Dr. Rufina Cough - [facility], CHIEF COMPLAINT:  Pleural effusion    History of Present Illness:  Lisa [name] is a 83 y.o. female with a past medical history significant for CHF, CAD, arrhythmias including V. tach, hypertension,, CML, and melanoma who presented to the ED ED at [facility] 4/26 for complaints of progressive right-sided flank and back pain that began 4 to 5 days prior to admission.  Of note patient underwent thoracentesis per interventional radiology 2 days prior to admission for large pleural effusion, 1500 mL were removed.  Appears patient had ex vacuo pneumo post thoracentesis.  I do not see in our system to see that any samples/cytology was obtained during thoracentesis.  She also had an urgent care visit at [facility] on 4/24 for similar complaints of back pain, flank pain and nausea where she was diagnosed with E. coli UTI and started on Keflex.    Patient was admitted per hospitalist with pulmonary critical care consulted for management of hydro pneumothorax On arrival to [facility] patient went into repeated V. tach with firing of her implanted ICD.  She was transferred to ICU, started on amiodarone  infusion  Pertinent  Medical History  CHF, CAD, arrhythmias including V. tach, hypertension,, CML, and melanoma  Significant Hospital Events: Including procedures, antibiotic start and stop dates in addition to other pertinent events   4/26 presented with progressive right sided flank pain with nausea and decreased oral intake.  CT renal study with large left hydropneumothorax   Interim History / Subjective:  Left pigtail CT clamped since around 2130 given c/o of left sided chest pain and after 1.2L drained.  EKG non acute and trop hs trending down, 87> 31.  C/o of mild left flank soreness, denies SOB.  On RA.   Objective   Blood  pressure (!) 125/54, pulse 68, temperature 98 F (36.7 C), temperature source Oral, resp. rate (!) 23, height 5\' 1"  (1.549 m), weight 76.4 kg, last menstrual period 02/14/1995, SpO2 94%.        Intake/Output Summary (Last 24 hours) at 12/06/2023 1008 Last data filed at 12/06/2023 0700 Gross per 24 hour  Intake 700.19 ml  Output 1375 ml  Net -674.81 ml   Filed Weights   12/04/23 1048 12/06/23 0500  Weight: 76.7 kg 76.4 kg   Examination: General:  Pleasant elderly female lying in bed in NAD Neuro: alert, appropriate, MAE CV: vpaced, no murmur PULM:  non labored, clear, diminished in bases L> R GI: soft, bs+, NT Extremities: warm/dry, chronic LE lymphedema   Skin: no rashes    CT renal 12/04/2023- large left hydro pneumothorax, no acute findings in the abdomen or pelvis Chest x-ray 12/04/2023- pneumothorax ex vacuo on the left. CT chest 12/05/23- left-sided hydropneumothorax with trapped lung  Resolved Hospital Problem list     Assessment & Plan:   Pneumothorax ex vacuo - Pneumothorax ex vacuo following IR thoracentesis 4/24 for left-sided pleural effusion.  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   - suspected trapped lung likely due to inflammation and scarring from prolonged effusion.  - CXR last evening showed smaller left ptx, persistent loculated at base - pleural fluid 4/27 exudate by protein on Lights, likely pseudo, transudate by LDH P:  - repeat CXR since CT clamped since last night  after 1.2L drained.  Currently asymptomatic  - follow pleural culture and cytology - will reassess after CXR and further discussion with attending   Remainder per primary team.  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   Hypothyroidism   Best Practice (right click  and "Reselect all SmartList Selections" daily)  Per primary team- [facility]  Labs   CBC: Recent Labs  Lab 12/04/23 1144 12/04/23 1821 12/05/23 0247 12/06/23 0323  WBC 4.7 7.0 5.0 5.4  NEUTROABS 3.6  --   --   --   HGB 8.9* 10.2* 8.2* 7.9*  HCT 27.1* 30.2* 25.5* 25.3*  MCV 96.8 95.0 97.3 99.2  PLT 173 264 169 165    Basic Metabolic Panel: Recent Labs  Lab 12/04/23 1144 12/04/23 1903 12/05/23 0247 12/05/23 1721 12/06/23 0323  NA 138 137 135 136 136  K 4.4 3.3* 4.6 4.5 4.3  CL 102 102 104 102 103  CO2 23 21* 22 22 23   GLUCOSE 115* 133* 109* 128* 122*  BUN 17 13 16 17 15   CREATININE 1.75* 1.55* 1.49* 1.70* 1.81*  CALCIUM  9.2 8.6* 8.2* 8.3* 8.1*  MG  --  1.7 2.3  --  2.2   GFR: Estimated Creatinine Clearance: 22.8 mL/min (A) (by C-G formula based on SCr of 1.81 mg/dL (H)). Recent Labs  Lab 12/04/23 1144 12/04/23 1821 12/05/23 0247 12/06/23 0323  WBC 4.7 7.0 5.0 5.4    Liver Function Tests: Recent Labs  Lab 12/04/23 1144 12/04/23 1903 12/05/23 0247  AST 29 31 25   ALT 24 23 21   ALKPHOS 78 58 46  BILITOT 0.9 1.2 0.7  PROT 6.3* 6.4* 5.3*  ALBUMIN  3.8 3.4* 2.8*   No results for input(s): "LIPASE", "AMYLASE" in the last 168 hours. No results for input(s): "AMMONIA" in the last 168 hours.  ABG    Component Value Date/Time   HCO3 26.8 08/09/2023 0427   TCO2 27 08/08/2023 1709   ACIDBASEDEF 1.0 08/09/2023 0427   O2SAT 95.9 08/09/2023 0427     Coagulation Profile: No results for input(s): "INR", "PROTIME" in the last 168 hours.  Cardiac Enzymes: No results for input(s): "CKTOTAL", "CKMB", "CKMBINDEX", "TROPONINI" in the last 168 hours.  HbA1C: Hgb A1c MFr Bld  Date/Time Value Ref Range Status  07/17/2023 03:46 PM 5.4 4.8 - 5.6 % Final    Comment:    (NOTE) Pre diabetes:          5.7%-6.4%  Diabetes:              >6.4%  Glycemic control for   <7.0% adults with diabetes   10/03/2015 11:42 PM 6.2 (H) 4.8 - 5.6 % Final    Comment:    (NOTE)          Pre-diabetes: 5.7 - 6.4         Diabetes: >6.4         Glycemic control for adults with diabetes: <7.0     CBG: No results for input(s): "GLUCAP" in the last 168 hours.    Early Glisson, MSN, AG-ACNP-BC [facility] 12/06/2023, 10:09 AM  See Amion for pager If no response to pager , please call [phone] until 7pm After 7:00 pm call Elink  [phone]

## 2023-12-06 NOTE — Plan of Care (Signed)
  Problem: Education: Goal: Knowledge of General Education information will improve Description: Including pain rating scale, medication(s)/side effects and non-pharmacologic comfort measures Outcome: Progressing   Problem: Health Behavior/Discharge Planning: Goal: Ability to manage health-related needs will improve Outcome: Progressing   Problem: Clinical Measurements: Goal: Ability to maintain clinical measurements within normal limits will improve Outcome: Progressing Goal: Will remain free from infection Outcome: Progressing Goal: Diagnostic test results will improve Outcome: Progressing Goal: Respiratory complications will improve Outcome: Progressing Goal: Cardiovascular complication will be avoided Outcome: Progressing   Problem: Activity: Goal: Risk for activity intolerance will decrease Outcome: Progressing   Problem: Nutrition: Goal: Adequate nutrition will be maintained Outcome: Progressing   Problem: Elimination: Goal: Will not experience complications related to bowel motility Outcome: Progressing Goal: Will not experience complications related to urinary retention Outcome: Progressing   Problem: Pain Managment: Goal: General experience of comfort will improve and/or be controlled Outcome: Progressing   Problem: Safety: Goal: Ability to remain free from injury will improve Outcome: Progressing   Problem: Skin Integrity: Goal: Risk for impaired skin integrity will decrease Outcome: Progressing   Problem: Education: Goal: Ability to demonstrate management of disease process will improve Outcome: Progressing Goal: Ability to verbalize understanding of medication therapies will improve Outcome: Progressing Goal: Individualized Educational Video(s) Outcome: Progressing   Problem: Activity: Goal: Capacity to carry out activities will improve Outcome: Progressing   Problem: Cardiac: Goal: Ability to achieve and maintain adequate cardiopulmonary  perfusion will improve Outcome: Progressing

## 2023-12-06 NOTE — Progress Notes (Signed)
 PROGRESS NOTE  Lisa [name] ZOX:[id number] DOB: March 31, 1941 DOA: 12/04/2023 PCP: [name], DO   LOS: 2 days   Brief narrative:  Lisa [name] 83 y.o. female with past medical history significant of hypertension, hypothyroidism, anemia, V. tach/V-fib, cardiac arrest, CHF, CML, obesity, recurrent pleural effusion, venous stasis, lymphedema presented to hospital with flank pain for several days especially on the right side.  She had gone to urgent care center and was diagnosed of UTI and urine culture showed E. coli.  She was started on Keflex.  She also had outpatient therapeutic thoracocentesis.  Due to worsening flank pain patient presented to the ED.  In the ED labs showed creatinine of 1.7 with hemoglobin of 8.9.  No leukocytosis.  BNP still significantly elevated to 4071.  Urinalysis with ketones and protein.  Chest x-ray showed large pneumothorax and cardiomegaly.  CT scanning of the abdomen showed large left hydropneumothorax.  Pulmonary was consulted in the ED and was also noted to have V. tach with firing of her implanted ICD.  Patient was then started on amiodarone  drip and was admitted to the ICU.    Significant events. 4/26.  Presented with flank pain nausea decreased intake.  CT with large left hydropneumothorax. 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 tach with implanted defibrillator.  Electrophysiology Cardiology was  consulted.  On amiodarone  drip.  Patient is tentatively scheduled for generator change on 01/07/2024.   UTI Diagnosed with E. coli UTI urgent care few days ago.  Was on Keflex.  Was changed to ceftriaxone  during hospitalization.  Blood cultures negative in 2 days.  No leukocytosis.  Urine culture with insignificant growth.  Pneumothorax ex vacuo History of chronic pleural effusion with 3 thoracocentesis.  Patient likely developed pneumothorax ex vacuo following thoracentesis for left-sided pleural effusion.   Suspect that the lung is trapped and the lung is not fully expanded, likely due to inflammation and scarring from prolonged effusion. Currently, the pneumothorax is not the primary concern given the cardiac issues.  Patient underwent left-sided pigtail chest tube catheterization and suctioning.  Pulmonary following for chest tube management  Chronic combined CHF.  Continue IV Lasix  twice daily.  Strict intake and output charting Daily weights..   AKI on CKD stage IIIb Baseline serum creatinine is around 1.5.  Creatinine today at 1.8.  Will continue to monitor.   History of CML Continue Gleevec .  Check CBC closely.  No leukocytosis.   Hypothyroidism Continue Armour 15 mg daily  Class I obesity.  Body mass index is 31.82 kg/m.  Would benefit from lifestyle modifications and ongoing weight loss as outpatient.  DVT prophylaxis: heparin  injection 5,000 Units Start: 12/04/23 2200   Disposition: Uncertain at this time.  Will need PT OT evaluation.  Status is: Inpatient Remains inpatient appropriate because: Pending clinical improvement, left-sided chest tube, follow pulmonary and cardiology recommendations.    Code Status:     Code Status: Full Code  Family Communication: Spoke with the patient's son on the phone and updated  him about the clinical condition of the patient.  Consultants: Electrophysiology cardiology PCCM  Procedures: Left chest wall pigtail catheter in  place  Anti-infectives:  Rocephin  IV  Anti-infectives (From admission, onward)    Start     Dose/Rate Route Frequency Ordered Stop   12/05/23 1400  cefTRIAXone  (ROCEPHIN ) 2 g in sodium chloride  0.9 % 100 mL IVPB        2 g 200 mL/hr over 30 Minutes Intravenous Every 24 hours 12/05/23 1312 12/08/23 1359   12/04/23 1732  cephALEXin (KEFLEX) capsule 500 mg  Status:  Discontinued        500 mg Oral Every 12 hours 12/04/23 1741 12/05/23 1312        Subjective: Today, patient was seen and examined at bedside.  Patient states that she feels constipated with some tiredness and fatigue.  Denies overt dyspnea but had some chest discomfort yesterday.  Tube was clamped as per the patient.  Normally has diarrhea at home.  Objective: Vitals:   12/06/23 0900 12/06/23 1000  BP: (!) 125/54   Pulse: 68 68  Resp: 16 (!) 23  Temp:    SpO2: 100% 94%    Intake/Output Summary (Last 24 hours) at 12/06/2023 1013 Last data filed at 12/06/2023 0700 Gross per 24 hour  Intake 700.19 ml  Output 1375 ml  Net -674.81 ml   Filed Weights   12/04/23 1048 12/06/23 0500  Weight: 76.7 kg 76.4 kg   Body mass index is 31.82 kg/m.   Physical Exam: GENERAL: Patient is alert awake and oriented. Not in obvious distress.  Obese built, on nasal cannula oxygen, appears chronically ill and deconditioned. HENT: Mild pallor noted.  Pupils equally reactive to light. Oral mucosa is moist NECK: is supple, no gross swelling noted. CHEST:   Diminished breath sounds mostly on left side..  Left-sided chest tube in place. CVS: S1 and S2 heard, no murmur.  Irregular but rate controlled ABDOMEN: Soft, non-tender, bowel sounds are present.  External urinary catheter. EXTREMITIES: No edema. CNS: Cranial nerves are intact. No focal motor deficits. SKIN: warm and dry without rashes.  Data Review: I have personally reviewed the following laboratory data and studies,  CBC: Recent Labs  Lab 12/04/23 1144 12/04/23 1821  12/05/23 0247 12/06/23 0323  WBC 4.7 7.0 5.0 5.4  NEUTROABS 3.6  --   --   --   HGB 8.9* 10.2* 8.2* 7.9*  HCT 27.1* 30.2* 25.5* 25.3*  MCV 96.8 95.0 97.3 99.2  PLT 173 264 169 165   Basic Metabolic Panel: Recent Labs  Lab 12/04/23 1144 12/04/23 1903 12/05/23 0247 12/05/23 1721 12/06/23 0323  NA 138 137 135 136 136  K 4.4 3.3* 4.6 4.5 4.3  CL 102 102 104 102 103  CO2 23 21* 22 22 23   GLUCOSE 115* 133* 109* 128* 122*  BUN 17 13 16 17 15   CREATININE 1.75* 1.55* 1.49* 1.70* 1.81*  CALCIUM  9.2 8.6* 8.2* 8.3* 8.1*  MG  --  1.7 2.3  --  2.2   Liver Function Tests: Recent Labs  Lab 12/04/23 1144 12/04/23 1903 12/05/23 0247  AST 29 31 25   ALT 24 23 21   ALKPHOS 78 58 46  BILITOT 0.9 1.2 0.7  PROT 6.3* 6.4* 5.3*  ALBUMIN  3.8 3.4* 2.8*   No results for input(s): "LIPASE", "AMYLASE" in the last 168 hours. No results for input(s): "AMMONIA" in the last 168 hours. Cardiac Enzymes: No results for input(s): "CKTOTAL", "CKMB", "CKMBINDEX", "TROPONINI" in the last 168 hours.  BNP (last 3 results) Recent Labs    08/08/23 1645 08/16/23 0232 08/17/23 0306  BNP 804.7* 246.6* 234.2*    ProBNP (last 3 results) Recent Labs    12/04/23 1144  PROBNP 4,071.0*    CBG: No results for input(s): "GLUCAP" in the last 168 hours. Recent Results (from the past 240 hours)  Urine Culture     Status: Abnormal   Collection Time: 12/04/23 12:02 PM   Specimen: Urine, Clean Catch  Result Value Ref Range Status   Specimen Description   Final    URINE, CLEAN CATCH Performed at [facility], [address] [place], [place] [address]    Special Requests   Final    NONE Performed at [facility], [address] [place], [place] [address]    Culture (A)  Final    <10,000 COLONIES/mL INSIGNIFICANT GROWTH Performed at [facility], [address] [place], [place] [address]    Report Status 12/05/2023 FINAL  Final  MRSA Next Gen by PCR,  Nasal     Status: None   Collection Time: 12/04/23  6:52 PM   Specimen: Nasal Mucosa; Nasal Swab  Result Value Ref Range Status   MRSA by PCR Next Gen NOT DETECTED NOT DETECTED Final    Comment: (NOTE) The GeneXpert MRSA Assay (FDA approved for NASAL specimens only), is one component of a comprehensive MRSA colonization surveillance program. It is not intended to diagnose MRSA infection nor to guide or monitor treatment for MRSA infections. Test performance is not FDA approved in patients less than 79 years old. Performed at [facility], [address] [place], [place] [address]   Culture, blood (Routine X 2) w Reflex to ID Panel     Status: None (Preliminary result)   Collection Time: 12/04/23  7:03 PM   Specimen: BLOOD  Result Value Ref Range Status   Specimen Description BLOOD SITE NOT SPECIFIED  Final   Special Requests   Final    BOTTLES DRAWN AEROBIC ONLY Blood Culture results may not be optimal due to an inadequate volume of blood received in culture bottles   Culture   Final    NO GROWTH 2 DAYS Performed at [facility], [address] [place], [place] [address]    Report Status PENDING  Incomplete  Culture, blood (Routine X 2) w Reflex to ID Panel     Status: None (Preliminary result)   Collection Time: 12/04/23  7:11 PM   Specimen: BLOOD  Result Value Ref Range Status   Specimen Description BLOOD SITE NOT SPECIFIED  Final   Special Requests   Final    BOTTLES DRAWN AEROBIC ONLY Blood Culture results may not be optimal due to an inadequate volume of blood received in culture bottles   Culture   Final    NO GROWTH 2 DAYS Performed at [facility], [address], [place], [place] [address]    Report Status PENDING  Incomplete  Body fluid culture w Gram Stain     Status: None (Preliminary result)   Collection Time: 12/05/23  2:59 PM   Specimen: Pleural Fluid  Result Value Ref Range Status   Specimen Description PLEURAL  Final   Special Requests  NONE  Final   Gram Stain   Final    NO WBC SEEN NO ORGANISMS SEEN Performed at [facility], [address] [place], [place] [address]    Culture PENDING  Incomplete  Report Status PENDING  Incomplete     Studies: DG CHEST PORT 1 VIEW Result Date: 12/05/2023 CLINICAL DATA:  Follow up pneumothorax EXAM: PORTABLE CHEST 1 VIEW COMPARISON:  12/05/2023, 3:05 p.m. FINDINGS: Left basilar chest tube in place. Persistent pneumothorax is noted partially loculated at the base. There is a decrease in the pleural separation at the apex. Cardiac silhouette is enlarged. No pleural effusions. Calcified aorta. Left-sided pacer. IMPRESSION: Left-sided pneumothorax may be slightly smaller compared to the prior study. Electronically Signed   By: Sydell Eva M.D.   On: 12/05/2023 21:43   DG CHEST PORT 1 VIEW Result Date: 12/05/2023 CLINICAL DATA:  Status post chest tube placement EXAM: PORTABLE CHEST 1 VIEW COMPARISON:  CT of earlier today.  Plain film of yesterday. FINDINGS: Pacer/ICD. Normal heart size. Atherosclerosis in the transverse aorta. Placement of a left-sided pigtail pleural catheter which terminates inferiorly and medially. The moderate left sided pneumothorax is felt to be similar, given differences in technique. Apical component measures 3.8 cm and inferolateral component measures maximally 3.5 cm. Patchy left sided atelectasis. Nonspecific mild interstitial thickening/coarsening. IMPRESSION: Placement of a left-sided pigtail pleural catheter, with similar moderate (30%) left pneumothorax. Aortic Atherosclerosis (ICD10-I70.0). Electronically Signed   By: Lore Rode M.D.   On: 12/05/2023 17:48   CT CHEST WO CONTRAST Result Date: 12/05/2023 CLINICAL DATA:  Pleural effusion, pneumothorax EXAM: CT CHEST WITHOUT CONTRAST TECHNIQUE: Multidetector CT imaging of the chest was performed following the standard protocol without IV contrast. RADIATION DOSE REDUCTION: This exam was performed according  to the departmental dose-optimization program which includes automated exposure control, adjustment of the mA and/or kV according to patient size and/or use of iterative reconstruction technique. COMPARISON:  12/04/2023, 08/08/2023 FINDINGS: Cardiovascular: Unenhanced imaging of the heart is unremarkable without pericardial effusion. Multi lead pacemaker again identified. Normal caliber of the thoracic aorta. Atherosclerosis of the aorta and coronary vasculature. Assessment of the vascular lumen cannot be performed without IV contrast. Mediastinum/Nodes: No enlarged mediastinal or axillary lymph nodes. Thyroid  gland, trachea, and esophagus demonstrate no significant findings. Lungs/Pleura: A large left-sided hydropneumothorax is again identified, unchanged since recent x-ray and CT. Volume of fluid estimated greater than 2 L. Dense consolidation and volume loss within the lingula and left lower lobe compatible with atelectasis. No tension effect or mediastinal shift. Trace free-flowing right pleural effusion. Minimal dependent right lower lobe atelectasis. No right-sided pneumothorax. The central airways are patent. Upper Abdomen: Diffuse high attenuation of the liver parenchyma consistent with history of amiodarone  therapy. High attenuation material within the gallbladder may reflect sludge. Musculoskeletal: No acute or destructive bony abnormalities. Diffuse thoracic spondylosis with flowing anterior osteophytes. Reconstructed images demonstrate no additional findings. IMPRESSION: 1. Large left-sided hydropneumothorax, with fluid component estimated greater than 2 L, not appreciably changed since prior imaging. Dense consolidation volume loss within the lingula and left lower lobe compatible with atelectasis, and may reflect underlying trapped lung. No tension effect or mediastinal shift. 2. Trace free-flowing right pleural effusion and minimal dependent right lower lobe atelectasis. 3. Increased attenuation of  liver parenchyma consistent with history of amiodarone  therapy. 4. Gallbladder sludge.  No evidence of cholecystitis. 5. Aortic Atherosclerosis (ICD10-I70.0). Coronary artery atherosclerosis. Electronically Signed   By: Bobbye Burrow M.D.   On: 12/05/2023 15:14   ECHOCARDIOGRAM COMPLETE Result Date: 12/05/2023    ECHOCARDIOGRAM REPORT   Patient Name:   CHISA MORGANELLI Date of Exam: 12/05/2023 Medical Rec #:  [id number]          Height:  61.0 in Accession #:    [id number]         Weight:       169.0 lb Date of Birth:  Aug 07, 1941           BSA:          1.758 m Patient Age:    82 years           BP:           133/62 mmHg Patient Gender: F                  HR:           67 bpm. Exam Location:  Inpatient Procedure: 2D Echo, Cardiac Doppler, Color Doppler and Intracardiac            Opacification Agent (Both Spectral and Color Flow Doppler were            utilized during procedure). Indications:    Ventricular Tachycardia I47.2  History:        Patient has prior history of Echocardiogram examinations, most                 recent 08/09/2023. CHF, Angina, Defibrillator, CKD, stage 3,                 Arrythmias:Atrial Fibrillation, Ventricular Fibrillation, PVC                 and Tachycardia; Risk Factors:Hypertension.  Sonographer:    Terrilee Few RCS Referring Phys: [id number] PRAVEEN MANNAM IMPRESSIONS  1. Left ventricular ejection fraction, by estimation, is 40 to 45%. The left ventricle has mildly decreased function. The left ventricle has no regional wall motion abnormalities. Left ventricular diastolic parameters are consistent with Grade I diastolic dysfunction (impaired relaxation).  2. Right ventricular systolic function is normal. The right ventricular size is normal.  3. The mitral valve is normal in structure. No evidence of mitral valve regurgitation. No evidence of mitral stenosis.  4. The aortic valve is normal in structure. Aortic valve regurgitation is not visualized. No aortic stenosis is  present.  5. The inferior vena cava is normal in size with greater than 50% respiratory variability, suggesting right atrial pressure of 3 mmHg. Comparison(s): No significant change from prior study. Prior images reviewed side by side. FINDINGS  Left Ventricle: Left ventricular ejection fraction, by estimation, is 40 to 45%. The left ventricle has mildly decreased function. The left ventricle has no regional wall motion abnormalities. Definity contrast agent was given IV to delineate the left ventricular endocardial borders. The left ventricular internal cavity size was normal in size. There is no left ventricular hypertrophy. Left ventricular diastolic parameters are consistent with Grade I diastolic dysfunction (impaired relaxation).  LV Wall Scoring: The posterior wall is akinetic. Right Ventricle: The right ventricular size is normal. No increase in right ventricular wall thickness. Right ventricular systolic function is normal. Left Atrium: Left atrial size was normal in size. Right Atrium: Right atrial size was normal in size. Pericardium: There is no evidence of pericardial effusion. Mitral Valve: The mitral valve is normal in structure. No evidence of mitral valve regurgitation. No evidence of mitral valve stenosis. Tricuspid Valve: The tricuspid valve is normal in structure. Tricuspid valve regurgitation is not demonstrated. No evidence of tricuspid stenosis. Aortic Valve: The aortic valve is normal in structure. Aortic valve regurgitation is not visualized. No aortic stenosis is present. Aortic valve peak gradient measures 7.3 mmHg. Pulmonic Valve: The pulmonic valve was normal  in structure. Pulmonic valve regurgitation is not visualized. No evidence of pulmonic stenosis. Aorta: The aortic root is normal in size and structure. Venous: The inferior vena cava is normal in size with greater than 50% respiratory variability, suggesting right atrial pressure of 3 mmHg. IAS/Shunts: No atrial level shunt detected  by color flow Doppler. Additional Comments: A device lead is visualized in the right ventricle.  LEFT VENTRICLE PLAX 2D LVIDd:         4.40 cm      Diastology LVIDs:         3.60 cm      LV e' medial:    6.42 cm/s LV PW:         0.90 cm      LV E/e' medial:  10.7 LV IVS:        0.90 cm      LV e' lateral:   6.31 cm/s LVOT diam:     2.00 cm      LV E/e' lateral: 10.9 LV SV:         60 LV SV Index:   34 LVOT Area:     3.14 cm  LV Volumes (MOD) LV vol d, MOD A2C: 130.0 ml LV vol d, MOD A4C: 135.0 ml LV vol s, MOD A2C: 80.1 ml LV vol s, MOD A4C: 73.7 ml LV SV MOD A2C:     49.9 ml LV SV MOD A4C:     135.0 ml LV SV MOD BP:      58.4 ml RIGHT VENTRICLE             IVC RV S prime:     13.70 cm/s  IVC diam: 2.20 cm TAPSE (M-mode): 2.7 cm LEFT ATRIUM             Index        RIGHT ATRIUM           Index LA diam:        3.40 cm 1.93 cm/m   RA Area:     11.70 cm LA Vol (A2C):   32.7 ml 18.60 ml/m  RA Volume:   22.00 ml  12.51 ml/m LA Vol (A4C):   38.0 ml 21.61 ml/m LA Biplane Vol: 38.6 ml 21.95 ml/m  AORTIC VALVE AV Area (Vmax): 2.05 cm AV Vmax:        135.00 cm/s AV Peak Grad:   7.3 mmHg LVOT Vmax:      88.17 cm/s LVOT Vmean:     60.700 cm/s LVOT VTI:       0.190 m  AORTA Ao Root diam: 2.90 cm Ao Asc diam:  3.00 cm MITRAL VALVE               TRICUSPID VALVE MV Area (PHT): 3.65 cm    TR Peak grad:   7.7 mmHg MV Decel Time: 208 msec    TR Vmax:        139.00 cm/s MV E velocity: 68.80 cm/s MV A velocity: 75.30 cm/s  SHUNTS MV E/A ratio:  0.91        Systemic VTI:  0.19 m                            Systemic Diam: 2.00 cm Dorothye Gathers MD Electronically signed by Dorothye Gathers MD Signature Date/Time: 12/05/2023/11:11:21 AM    Final    DG Chest Portable 1 View Result Date: 12/04/2023 CLINICAL DATA:  Shortness of  breath. Left-sided thoracentesis two days ago. EXAM: PORTABLE CHEST 1 VIEW COMPARISON:  One-view chest x-ray 12/02/2023 FINDINGS: The heart is enlarged. Atherosclerotic calcifications are present at the aortic arch.  Left-sided pneumothorax is improved but incompletely resolved. The right lung is clear. IMPRESSION: 1. Improved but incompletely resolved left-sided pneumothorax. 2. Cardiomegaly without failure. Electronically Signed   By: Audree Leas M.D.   On: 12/04/2023 16:49   CT Renal Stone Study Result Date: 12/04/2023 CLINICAL DATA:  Abdominal/flank pain, right-sided, history of UTI EXAM: CT ABDOMEN AND PELVIS WITHOUT CONTRAST TECHNIQUE: Multidetector CT imaging of the abdomen and pelvis was performed following the standard protocol without IV contrast. RADIATION DOSE REDUCTION: This exam was performed according to the departmental dose-optimization program which includes automated exposure control, adjustment of the mA and/or kV according to patient size and/or use of iterative reconstruction technique. COMPARISON:  08/08/2023 FINDINGS: Lower chest: Large hydropneumothorax at the left lung base. Transvenous pacing leads. Hepatobiliary: No focal liver abnormality is seen. No gallstones, gallbladder wall thickening, or biliary dilatation. Pancreas: Unremarkable. No pancreatic ductal dilatation or surrounding inflammatory changes. Spleen: Normal in size without focal abnormality. Adrenals/Urinary Tract: No definite adrenal mass. Symmetric renal contours. Right kidney is ptotic. No urolithiasis or hydronephrosis. Urinary bladder partially distended. Stomach/Bowel: Stomach is nondistended, without acute finding. Small bowel decompressed. Colon unremarkable. Vascular/Lymphatic: Extensive aortoiliac calcified plaque without AAA. No evident abdominal or pelvic adenopathy. Reproductive: Status post hysterectomy. No adnexal masses. Other: Trace free fluid in the pelvis. No abdominal ascites. No free air. Musculoskeletal: Mild spondylitic changes in the mid lumbar spine. Osteitis pubis. IMPRESSION: 1. Large left hydropneumothorax. Critical Value/emergent results were called by telephone at the time of interpretation on  12/04/2023 at 12:24 pm to provider [facility] , who verbally acknowledged these results. 2. No acute findings in the abdomen or pelvis. 3.  Aortic Atherosclerosis (ICD10-I70.0). Electronically Signed   By: Nicoletta Barrier M.D.   On: 12/04/2023 12:25      Rosena Conradi, MD  [facility] 12/06/2023  If 7PM-7AM, please contact night-coverage

## 2023-12-06 NOTE — Progress Notes (Signed)
   Battery is OK. Discussed with industry and pts device can deliver up to 6 shocks while at RRT without hastening time to EOS.    If she were to have ADDITIONAL ICD shocks prior to 5/30, will need to expedite.   Bambi Lever [address] [place], PA-C  12/06/2023 3:11 PM

## 2023-12-06 NOTE — Progress Notes (Signed)
 Heart Failure Navigator Progress Note  Assessed for Heart & Vascular TOC clinic readiness.  Patient does not meet criteria due to has a scheduled [facility] appointment on 12/20/2023.  No TOC .  Navigator will sign off at this time.   Randie Bustle, BSN, Scientist, clinical (histocompatibility and immunogenetics) Only

## 2023-12-07 ENCOUNTER — Inpatient Hospital Stay (HOSPITAL_COMMUNITY)

## 2023-12-07 ENCOUNTER — Encounter: Payer: Self-pay | Admitting: Internal Medicine

## 2023-12-07 DIAGNOSIS — I10 Essential (primary) hypertension: Secondary | ICD-10-CM | POA: Diagnosis not present

## 2023-12-07 DIAGNOSIS — J948 Other specified pleural conditions: Secondary | ICD-10-CM | POA: Diagnosis not present

## 2023-12-07 DIAGNOSIS — I5033 Acute on chronic diastolic (congestive) heart failure: Secondary | ICD-10-CM | POA: Diagnosis not present

## 2023-12-07 DIAGNOSIS — I4901 Ventricular fibrillation: Secondary | ICD-10-CM | POA: Diagnosis not present

## 2023-12-07 DIAGNOSIS — J9601 Acute respiratory failure with hypoxia: Secondary | ICD-10-CM | POA: Diagnosis not present

## 2023-12-07 DIAGNOSIS — Z4682 Encounter for fitting and adjustment of non-vascular catheter: Secondary | ICD-10-CM | POA: Diagnosis not present

## 2023-12-07 DIAGNOSIS — J9383 Other pneumothorax: Secondary | ICD-10-CM | POA: Diagnosis not present

## 2023-12-07 DIAGNOSIS — J9819 Other pulmonary collapse: Secondary | ICD-10-CM | POA: Diagnosis not present

## 2023-12-07 LAB — CYTOLOGY - NON PAP

## 2023-12-07 LAB — BASIC METABOLIC PANEL WITH GFR
Anion gap: 10 (ref 5–15)
BUN: 16 mg/dL (ref 8–23)
CO2: 24 mmol/L (ref 22–32)
Calcium: 8.1 mg/dL — ABNORMAL LOW (ref 8.9–10.3)
Chloride: 102 mmol/L (ref 98–111)
Creatinine, Ser: 1.66 mg/dL — ABNORMAL HIGH (ref 0.44–1.00)
GFR, Estimated: 31 mL/min — ABNORMAL LOW (ref >60)
Glucose, Bld: 109 mg/dL — ABNORMAL HIGH (ref 70–99)
Potassium: 3.5 mmol/L (ref 3.5–5.1)
Sodium: 136 mmol/L (ref 135–145)

## 2023-12-07 LAB — CBC
HCT: 25.7 % — ABNORMAL LOW (ref 36.0–46.0)
Hemoglobin: 8.3 g/dL — ABNORMAL LOW (ref 12.0–15.0)
MCH: 31.3 pg (ref 26.0–34.0)
MCHC: 32.3 g/dL (ref 30.0–36.0)
MCV: 97 fL (ref 80.0–100.0)
Platelets: 176 10*3/uL (ref 150–400)
RBC: 2.65 MIL/uL — ABNORMAL LOW (ref 3.87–5.11)
RDW: 15.4 % (ref 11.5–15.5)
WBC: 5.3 10*3/uL (ref 4.0–10.5)
nRBC: 0 % (ref 0.0–0.2)

## 2023-12-07 LAB — SURGICAL PATHOLOGY

## 2023-12-07 LAB — MAGNESIUM: Magnesium: 1.9 mg/dL (ref 1.7–2.4)

## 2023-12-07 MED ORDER — TORSEMIDE 20 MG PO TABS
40.0000 mg | ORAL_TABLET | Freq: Every day | ORAL | Status: DC
  Administered 2023-12-07 – 2023-12-12 (×6): 40 mg via ORAL
  Filled 2023-12-07 (×6): qty 2

## 2023-12-07 MED ORDER — POTASSIUM CHLORIDE CRYS ER 20 MEQ PO TBCR
40.0000 meq | EXTENDED_RELEASE_TABLET | Freq: Every day | ORAL | Status: DC
  Administered 2023-12-07: 40 meq via ORAL
  Filled 2023-12-07: qty 2

## 2023-12-07 MED ORDER — MAGNESIUM SULFATE 2 GM/50ML IV SOLN
2.0000 g | Freq: Once | INTRAVENOUS | Status: AC
  Administered 2023-12-07: 2 g via INTRAVENOUS
  Filled 2023-12-07: qty 50

## 2023-12-07 NOTE — Progress Notes (Signed)
 NAME:  Lisa [name], MRN:  [id number], DOB:  10/17/1940, LOS: 3 ADMISSION DATE:  12/04/2023, CONSULTATION DATE:  12/04/2023 REFERRING MD:  Dr. Rufina Cough - [facility], CHIEF COMPLAINT:  Pleural effusion    History of Present Illness:  Lisa [name] is a 83 y.o. female with a past medical history significant for CHF, CAD, arrhythmias including V. tach, hypertension,, CML, and melanoma who presented to the ED ED at [facility] 4/26 for complaints of progressive right-sided flank and back pain that began 4 to 5 days prior to admission.  Of note patient underwent thoracentesis per interventional radiology 2 days prior to admission for large pleural effusion, 1500 mL were removed.  Appears patient had ex vacuo pneumo post thoracentesis.  I do not see in our system to see that any samples/cytology was obtained during thoracentesis.  She also had an urgent care visit at [facility] on 4/24 for similar complaints of back pain, flank pain and nausea where she was diagnosed with E. coli UTI and started on Keflex.    Patient was admitted per hospitalist with pulmonary critical care consulted for management of hydro pneumothorax On arrival to [facility] patient went into repeated V. tach with firing of her implanted ICD.  She was transferred to ICU, started on amiodarone  infusion  Pertinent  Medical History  CHF, CAD, arrhythmias including V. tach, hypertension,, CML, and melanoma  Significant Hospital Events: Including procedures, antibiotic start and stop dates in addition to other pertinent events   4/26 presented with progressive right sided flank pain with nausea and decreased oral intake.  CT renal study with large left hydropneumothorax   Interim History / Subjective:  Denies sob or pain 600 ml out/ 24hrs, am CXR stable [place] changed per pt due to filling up today but noted not to have water instilled  Objective   Blood pressure (!) 138/54, pulse 70, temperature 98.5 F (36.9 C), temperature  source Oral, resp. rate 19, height 5\' 1"  (1.549 m), weight 84.4 kg, last menstrual period 02/14/1995, SpO2 98%.        Intake/Output Summary (Last 24 hours) at 12/07/2023 1629 Last data filed at 12/07/2023 1550 Gross per 24 hour  Intake --  Output 1525 ml  Net -1525 ml   Filed Weights   12/04/23 1048 12/06/23 0500 12/07/23 0500  Weight: 76.7 kg 76.4 kg 84.4 kg   Examination: General:  chronically ill appearing female lying in bed in NAD Neuro: alert, oriented, MAE generalized weakness CV: rr, paced PULM:  non labored, diminished bilaterally, left lateral pigtail- after water instilled, no few air bubbles which resolved, some tidaling present Extremities: warm/dry, chronic LE lymphedema  Skin: no rashes   Labs/ CXR reviewed   CT renal 12/04/2023- large left hydro pneumothorax, no acute findings in the abdomen or pelvis Chest x-ray 12/04/2023- pneumothorax ex vacuo on the left. CT chest 12/05/23- left-sided hydropneumothorax with trapped lung  Resolved Hospital Problem list     Assessment & Plan:   Pneumothorax ex vacuo - Pneumothorax ex vacuo following IR thoracentesis 4/24 for left-sided pleural effusion.  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   - suspected trapped lung likely due to inflammation and scarring from prolonged effusion.  - pleural fluid 4/27 exudate by protein on Lights, likely pseudo, transudate by LDH P:  - cont to waterseal - CXR in am or sooner if symptomatic  - cont chest tube till output ideally <  280ml/ day - flow cytometry neg, cytology neg   Remainder per primary team.  Pulmonary will continue to follow for CT management  Best Practice (right click and "Reselect all SmartList Selections" daily)  Per primary team- [facility]  Labs   CBC: Recent Labs  Lab 12/04/23 1144 12/04/23 1821 12/05/23 0247 12/06/23 0323 12/07/23 0424  WBC 4.7 7.0 5.0 5.4 5.3  NEUTROABS  3.6  --   --   --   --   HGB 8.9* 10.2* 8.2* 7.9* 8.3*  HCT 27.1* 30.2* 25.5* 25.3* 25.7*  MCV 96.8 95.0 97.3 99.2 97.0  PLT 173 264 169 165 176    Basic Metabolic Panel: Recent Labs  Lab 12/04/23 1903 12/05/23 0247 12/05/23 1721 12/06/23 0323 12/07/23 0424  NA 137 135 136 136 136  K 3.3* 4.6 4.5 4.3 3.5  CL 102 104 102 103 102  CO2 21* 22 22 23 24   GLUCOSE 133* 109* 128* 122* 109*  BUN 13 16 17 15 16   CREATININE 1.55* 1.49* 1.70* 1.81* 1.66*  CALCIUM  8.6* 8.2* 8.3* 8.1* 8.1*  MG 1.7 2.3  --  2.2 1.9   GFR: Estimated Creatinine Clearance: 26.2 mL/min (A) (by C-G formula based on SCr of 1.66 mg/dL (H)). Recent Labs  Lab 12/04/23 1821 12/05/23 0247 12/06/23 0323 12/07/23 0424  WBC 7.0 5.0 5.4 5.3    Liver Function Tests: Recent Labs  Lab 12/04/23 1144 12/04/23 1903 12/05/23 0247  AST 29 31 25   ALT 24 23 21   ALKPHOS 78 58 46  BILITOT 0.9 1.2 0.7  PROT 6.3* 6.4* 5.3*  ALBUMIN  3.8 3.4* 2.8*   No results for input(s): "LIPASE", "AMYLASE" in the last 168 hours. No results for input(s): "AMMONIA" in the last 168 hours.  ABG    Component Value Date/Time   HCO3 26.8 08/09/2023 0427   TCO2 27 08/08/2023 1709   ACIDBASEDEF 1.0 08/09/2023 0427   O2SAT 95.9 08/09/2023 0427     Coagulation Profile: No results for input(s): "INR", "PROTIME" in the last 168 hours.  Cardiac Enzymes: No results for input(s): "CKTOTAL", "CKMB", "CKMBINDEX", "TROPONINI" in the last 168 hours.  HbA1C: Hgb A1c MFr Bld  Date/Time Value Ref Range Status  07/17/2023 03:46 PM 5.4 4.8 - 5.6 % Final    Comment:    (NOTE) Pre diabetes:          5.7%-6.4%  Diabetes:              >6.4%  Glycemic control for   <7.0% adults with diabetes   10/03/2015 11:42 PM 6.2 (H) 4.8 - 5.6 % Final    Comment:    (NOTE)         Pre-diabetes: 5.7 - 6.4         Diabetes: >6.4         Glycemic control for adults with diabetes: <7.0     CBG: No results for input(s): "GLUCAP" in the last 168  hours.    Early Glisson, MSN, AG-ACNP-BC [facility] 12/07/2023, 4:29 PM  See Amion for pager If no response to pager , please call [phone] until 7pm After 7:00 pm call Elink  [phone]

## 2023-12-07 NOTE — Evaluation (Signed)
 Physical Therapy Evaluation Patient Details Name: Lisa [name] MRN: [id number] DOB: May 06, 1941 Today's Date: 12/07/2023  History of Present Illness  Pt is an 83 year old woman admitted 12/04/23  with R sided flank pain and nausea. CT renal study showed large L hydropneumothorax. Chest tube placed 4/27. Pt with V tach and ICD firing during admission requiring brief ICU stay for amiodarone  infusion. Of note, pt had thoracentesis 2 days prior.  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  Clinical Impression  Pt admitted with above diagnosis. Pt from home alone but has had assist from aide since last admission. Pt reports R sided back pain that extends towards R hip that has been present since last admission. Pt denies seeing anyone about this or having any imaging. Pt tender to palpation R sided back around L3, L4. She has increased pain in supine at night. She has pain with R heel slide and SLR but not bridging. Pt reports she has been using heat at home and that her primary care just sent her to outpt PT but she only had eval before returning here. Would be helpful to get some imaging of R back and hip. Currently needing min A for mobility. Will follow acutely and recommend continuing outpt PT at d/c.  Pt currently with functional limitations due to the deficits listed below (see PT Problem List). Pt will benefit from acute skilled PT to increase their independence and safety with mobility to allow discharge.           If plan is discharge home, recommend the following:     Can travel by private vehicle        Equipment Recommendations    Recommendations for Other Services       Functional Status Assessment       Precautions / Restrictions Precautions Precautions: Fall;Other (comment) Recall of Precautions/Restrictions: Intact Precaution/Restrictions Comments: chest tube Restrictions Weight Bearing Restrictions Per  Provider Order: No      Mobility  Bed Mobility Overal bed mobility: Needs Assistance Bed Mobility: Supine to Sit     Supine to sit: Mod assist, HOB elevated, Used rails Sit to supine: Mod assist, Used rails   General bed mobility comments: assist to bring LE to EOB and handheld assist to lift trunk    Transfers Overall transfer level: Needs assistance Equipment used: Rolling walker (2 wheels), Rollator (4 wheels) Transfers: Bed to chair/wheelchair/BSC, Sit to/from Stand Sit to Stand: Min assist, Contact guard assist   Step pivot transfers: Contact guard assist, Min assist       General transfer comment: Initially Min A for transfer bed to [facility] with RW. able to progress to CGA for transfer to chair from [facility] with RW. One final stand with rollator for purewick adjustment with no assist needed    Ambulation/Gait               General Gait Details: pt deferred at this time  Stairs            Wheelchair Mobility     Tilt Bed    Modified Rankin (Stroke Patients Only)       Balance Overall balance assessment: Needs assistance Sitting-balance support: No upper extremity supported, Feet supported Sitting balance-Leahy Scale: Fair     Standing balance support: Bilateral upper extremity supported, During functional activity, Single extremity supported, No upper extremity supported Standing balance-Leahy Scale: Poor  Pertinent Vitals/Pain      Home Living Family/patient expects to be discharged to:: Private residence Living Arrangements: Alone Available Help at Discharge: Family;Available PRN/intermittently;Personal care attendant Type of Home: House Home Access: Stairs to enter Entrance Stairs-Rails: Right Entrance Stairs-Number of Steps: 3   Home Layout: Two level;Able to live on main level with bedroom/bathroom Home Equipment: Rollator (4 wheels);Shower seat;Cane - single point Additional Comments: Has had  aide assist after prior hospitalization    Prior Function Prior Level of Function : Independent/Modified Independent;Driving             Mobility Comments: ambulatory with cane vs rollator at home. participatory in OP PT ADLs Comments: Modified Independent with ADLs, IADLs and driving usually. after prior hospitalization, did require aide assist for ADLs but has progress in recent months back to Modified Independent baseline     Extremity/Trunk Assessment             Cervical / Trunk Assessment Cervical / Trunk Assessment: Normal  Communication   Communication Communication: No apparent difficulties    Cognition Arousal: Alert Behavior During Therapy: WFL for tasks assessed/performed   PT - Cognitive impairments: No apparent impairments                         Following commands: Intact       Cueing Cueing Techniques: Verbal cues, Gestural cues     General Comments      Exercises Other Exercises Other Exercises: bridging x3, did not cause increased back pain Other Exercises: heel slide x3, increased R back pain Other Exercises: SLR x3, increased back pain only after all reps   Assessment/Plan    PT Assessment    PT Problem List         PT Treatment Interventions      PT Goals (Current goals can be found in the Care Plan section)       Frequency       Co-evaluation               AM-PAC PT "6 Clicks" Mobility  Outcome Measure                  End of Session   Activity Tolerance: Patient tolerated treatment well          Time:  -      Charges:                 Lisa [name], PT  Acute Rehab Services Secure chat preferred Office [phone]   Lisa [name] Lisa [name] 12/07/2023, 4:08 PM

## 2023-12-07 NOTE — Progress Notes (Signed)
  Telemetry remains stable.   Will see back in office prior to 5/30 gen change.   Reviewed [facility] driving restrictions x 6 month  Merla Starch, [place]  12/07/2023 9:41 AM

## 2023-12-07 NOTE — Progress Notes (Addendum)
 PROGRESS NOTE        PATIENT DETAILS Name: Lisa [name] Age: 83 y.o. Sex: female Date of Birth: July 26, 1941 Admit Date: 12/04/2023 Admitting Physician Johnetta Nab, MD WJX:[id number], Dossie Gayer, DO  Brief Summary: Patient is a 83 y.o.  female with history of prior cardiac arrest, V. tach/V-fib-s/p ICD placement, HTN, chronic mild systolic/diastolic heart failure, CML, hypothyroidism-who had a IR paracentesis on 4/25-presented to the hospital on 4/26 with right flank/back pain-she was subsequently found to have a large left hydropneumothorax-requiring chest tube placement-she was also found to have V. tach with repeated firing of her implanted ICD-she was started on amiodarone  infusion and subsequently transferred to the ICU.  She was stabilized and transferred back to [facility] on 4/28  Significant events: 4/25>> thoracocentesis by IR-1.45 L removed. 4/26>> admit to [facility]-hydropneumothorax 4/26>> V. tach-ICD firing-amiodarone  infusion-transfer to ICU 4/27>> CT chest inserted by CCM 4/28>> transferred to [facility]  Significant studies: 4/26>> CT abdomen/pelvis: Large left hydropneumothorax. 4/27>> CT chest: Large left-sided hydropneumothorax with fluid component greater than 2 L.  Significant microbiology data: 4/26>> culture<10,000 colonies/mL insignificant growth 4/26>> blood culture: No growth 8/27>> pleural fluid culture: No growth  Procedures: 4/27>> CT chest inserted by CCM  Consults: PCCM Cardiology/EP  Subjective: Lying comfortably in bed-denies any chest pain or shortness of breath.  Some pain in her right lateral leg area.  Objective: Vitals: Blood pressure (!) 138/52, pulse 72, temperature 98.4 F (36.9 C), temperature source Oral, resp. rate (!) 25, height 5\' 1"  (1.549 m), weight 84.4 kg, last menstrual period 02/14/1995, SpO2 96%.   Exam: Gen Exam:Alert awake-not in any distress HEENT:atraumatic, normocephalic Chest: B/L clear to  auscultation anteriorly CVS:S1S2 regular Abdomen:soft non tender, non distended Extremities:no edema-has chronic venous stasis changes. Neurology: Non focal Skin: no rash  Pertinent Labs/Radiology:    Latest Ref Rng & Units 12/07/2023    4:24 AM 12/06/2023    3:23 AM 12/05/2023    2:47 AM  CBC  WBC 4.0 - 10.5 K/uL 5.3  5.4  5.0   Hemoglobin 12.0 - 15.0 g/dL 8.3  7.9  8.2   Hematocrit 36.0 - 46.0 % 25.7  25.3  25.5   Platelets 150 - 400 K/uL 176  165  169     Lab Results  Component Value Date   NA 136 12/07/2023   K 3.5 12/07/2023   CL 102 12/07/2023   CO2 24 12/07/2023     Assessment/Plan: Recurrent ventricular tachycardia ICD in place No further V. tach Paced rhythm Keep K> 4, Mg> 2-will replete accordingly today. IV amiodarone  has been switched to oral amiodarone  EP following.  Pneumothorax ex vacuo S/p left-sided chest tube placement Suspected trapped lung likely due to inflammation/scarring from prolonged effusion. 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 Cytology pending-however prior cytologies x 3 negative.  Chronic combined systolic/diastolic heart failure (EF 40-45% with grade 1 diastolic dysfunction on 4/27) Euvolemic on exam Switch to oral diuretics.  HLD Statin  UTI Afebrile/no leukocytosis Rocephin  x 3 doses planned.  CML Gleevec  Follow CBC periodically  Hypothyroidism Armour Thyroid   CKD stage IIIb Creatinine close to baseline AKI ruled out.  Chronic venous stasis changes in bilateral lower extremities/lymphedema Diuretics  Debility/deconditioning PT/OT eval ordered today.  Class 1 Obesity: Estimated body mass index is 35.16 kg/m as calculated from  the following:   Height as of this encounter: 5\' 1"  (1.549 m).   Weight as of this encounter: 84.4 kg.   Code status:   Code Status: Full Code   DVT Prophylaxis: heparin  injection 5,000  Units Start: 12/04/23 2200   Family Communication: None at bedside   Disposition Plan: Status is: Inpatient Remains inpatient appropriate because: Severity of illness   Planned Discharge Destination:Home   Diet: Diet Order             Diet Heart Room service appropriate? Yes; Fluid consistency: Thin; Fluid restriction: 1800 mL Fluid  Diet effective now                     Antimicrobial agents: Anti-infectives (From admission, onward)    Start     Dose/Rate Route Frequency Ordered Stop   12/05/23 1400  cefTRIAXone  (ROCEPHIN ) 2 g in sodium chloride  0.9 % 100 mL IVPB        2 g 200 mL/hr over 30 Minutes Intravenous Every 24 hours 12/05/23 1312 12/08/23 1359   12/04/23 1732  cephALEXin (KEFLEX) capsule 500 mg  Status:  Discontinued        500 mg Oral Every 12 hours 12/04/23 1741 12/05/23 1312        MEDICATIONS: Scheduled Meds:  amiodarone   400 mg Oral BID   Followed by   Cecily Cohen ON 12/11/2023] amiodarone   200 mg Oral BID   atorvastatin   20 mg Oral Daily   Chlorhexidine  Gluconate Cloth  6 each Topical Daily   furosemide   40 mg Intravenous BID   heparin   5,000 Units Subcutaneous Q8H   imatinib   400 mg Oral QHS   thyroid   15 mg Oral QAC breakfast   Continuous Infusions:  cefTRIAXone  (ROCEPHIN )  IV 2 g (12/06/23 1433)   PRN Meds:.acetaminophen  **OR** acetaminophen , alum & mag hydroxide-simeth, morphine injection, nitroGLYCERIN , mouth rinse, polyethylene glycol, prochlorperazine    I have personally reviewed following labs and imaging studies  LABORATORY DATA: CBC: Recent Labs  Lab 12/04/23 1144 12/04/23 1821 12/05/23 0247 12/06/23 0323 12/07/23 0424  WBC 4.7 7.0 5.0 5.4 5.3  NEUTROABS 3.6  --   --   --   --   HGB 8.9* 10.2* 8.2* 7.9* 8.3*  HCT 27.1* 30.2* 25.5* 25.3* 25.7*  MCV 96.8 95.0 97.3 99.2 97.0  PLT 173 264 169 165 176    Basic Metabolic Panel: Recent Labs  Lab 12/04/23 1903 12/05/23 0247 12/05/23 1721 12/06/23 0323 12/07/23 0424  NA  137 135 136 136 136  K 3.3* 4.6 4.5 4.3 3.5  CL 102 104 102 103 102  CO2 21* 22 22 23 24   GLUCOSE 133* 109* 128* 122* 109*  BUN 13 16 17 15 16   CREATININE 1.55* 1.49* 1.70* 1.81* 1.66*  CALCIUM  8.6* 8.2* 8.3* 8.1* 8.1*  MG 1.7 2.3  --  2.2 1.9    GFR: Estimated Creatinine Clearance: 26.2 mL/min (A) (by C-G formula based on SCr of 1.66 mg/dL (H)).  Liver Function Tests: Recent Labs  Lab 12/04/23 1144 12/04/23 1903 12/05/23 0247  AST 29 31 25   ALT 24 23 21   ALKPHOS 78 58 46  BILITOT 0.9 1.2 0.7  PROT 6.3* 6.4* 5.3*  ALBUMIN  3.8 3.4* 2.8*   No results for input(s): "LIPASE", "AMYLASE" in the last 168 hours. No results for input(s): "AMMONIA" in the last 168 hours.  Coagulation Profile: No results for input(s): "INR", "PROTIME" in the last 168 hours.  Cardiac Enzymes: No results for  input(s): "CKTOTAL", "CKMB", "CKMBINDEX", "TROPONINI" in the last 168 hours.  BNP (last 3 results) Recent Labs    12/04/23 1144  PROBNP 4,071.0*    Lipid Profile: No results for input(s): "CHOL", "HDL", "LDLCALC", "TRIG", "CHOLHDL", "LDLDIRECT" in the last 72 hours.  Thyroid  Function Tests: Recent Labs    12/04/23 1903  TSH 2.817    Anemia Panel: No results for input(s): "VITAMINB12", "FOLATE", "FERRITIN", "TIBC", "IRON", "RETICCTPCT" in the last 72 hours.  Urine analysis:    Component Value Date/Time   COLORURINE YELLOW 12/04/2023 1202   APPEARANCEUR CLEAR 12/04/2023 1202   LABSPEC 1.010 12/04/2023 1202   PHURINE 8.0 12/04/2023 1202   GLUCOSEU NEGATIVE 12/04/2023 1202   HGBUR NEGATIVE 12/04/2023 1202   BILIRUBINUR NEGATIVE 12/04/2023 1202   BILIRUBINUR negative 06/04/2016 0847   BILIRUBINUR neg 02/14/2015 1431   KETONESUR 15 (A) 12/04/2023 1202   PROTEINUR TRACE (A) 12/04/2023 1202   UROBILINOGEN 0.2 06/04/2016 0847   NITRITE NEGATIVE 12/04/2023 1202   LEUKOCYTESUR NEGATIVE 12/04/2023 1202    Sepsis Labs: Lactic Acid, Venous    Component Value Date/Time    LATICACIDVEN 1.4 08/08/2023 2150    MICROBIOLOGY: Recent Results (from the past 240 hours)  Urine Culture     Status: Abnormal   Collection Time: 12/04/23 12:02 PM   Specimen: Urine, Clean Catch  Result Value Ref Range Status   Specimen Description   Final    URINE, CLEAN CATCH Performed at [facility], [address] [place], [place] [address]    Special Requests   Final    NONE Performed at [facility], [address] [place], [place] [address]    Culture (A)  Final    <10,000 COLONIES/mL INSIGNIFICANT GROWTH Performed at [facility], [address], [place], [place] [address]    Report Status 12/05/2023 FINAL  Final  MRSA Next Gen by PCR, Nasal     Status: None   Collection Time: 12/04/23  6:52 PM   Specimen: Nasal Mucosa; Nasal Swab  Result Value Ref Range Status   MRSA by PCR Next Gen NOT DETECTED NOT DETECTED Final    Comment: (NOTE) The GeneXpert MRSA Assay (FDA approved for NASAL specimens only), is one component of a comprehensive MRSA colonization surveillance program. It is not intended to diagnose MRSA infection nor to guide or monitor treatment for MRSA infections. Test performance is not FDA approved in patients less than 67 years old. Performed at [facility], [address], [place], [place] [address]   Culture, blood (Routine X 2) w Reflex to ID Panel     Status: None (Preliminary result)   Collection Time: 12/04/23  7:03 PM   Specimen: BLOOD  Result Value Ref Range Status   Specimen Description BLOOD SITE NOT SPECIFIED  Final   Special Requests   Final    BOTTLES DRAWN AEROBIC ONLY Blood Culture results may not be optimal due to an inadequate volume of blood received in culture bottles   Culture   Final    NO GROWTH 3 DAYS Performed at [facility], [address] [place], [place] [address]    Report Status PENDING  Incomplete  Culture, blood (Routine X 2) w Reflex to ID Panel      Status: None (Preliminary result)   Collection Time: 12/04/23  7:11 PM   Specimen: BLOOD  Result Value Ref Range Status   Specimen Description BLOOD SITE NOT SPECIFIED  Final   Special Requests  Final    BOTTLES DRAWN AEROBIC ONLY Blood Culture results may not be optimal due to an inadequate volume of blood received in culture bottles   Culture   Final    NO GROWTH 3 DAYS Performed at [facility], [address], [place], [place] [address]    Report Status PENDING  Incomplete  Body fluid culture w Gram Stain     Status: None (Preliminary result)   Collection Time: 12/05/23  2:59 PM   Specimen: Pleural Fluid  Result Value Ref Range Status   Specimen Description PLEURAL  Final   Special Requests NONE  Final   Gram Stain NO WBC SEEN NO ORGANISMS SEEN   Final   Culture   Final    NO GROWTH 2 DAYS Performed at [facility], [address], [place], [place] [address]    Report Status PENDING  Incomplete    RADIOLOGY STUDIES/RESULTS: DG Chest Port 1 View Result Date: 12/07/2023 CLINICAL DATA:  Follow-up left pleural effusion EXAM: PORTABLE CHEST 1 VIEW COMPARISON:  12/06/2023 FINDINGS: Pigtail catheter is again noted on the left. Persistent hydropneumothorax is seen on the left stable from the prior study. Defibrillator is again seen. Cardiac shadow is stable. Lungs are otherwise clear. IMPRESSION: Persistent left hydropneumothorax stable from the prior study. Electronically Signed   By: Violeta Grey M.D.   On: 12/07/2023 08:49   DG Chest Port 1 View Result Date: 12/06/2023 CLINICAL DATA:  Follow up pleural effusion. EXAM: PORTABLE CHEST 1 VIEW COMPARISON:  Radiographs 12/05/2023 and 12/04/2023.  CT 12/05/2023 FINDINGS: 1039 hours. Left subclavian AICD leads are unchanged in position. Small caliber left pleural catheter is unchanged in position, projecting over the left hemidiaphragm. Possible mild re-accumulation of pleural fluid at the costophrenic angle on the left. The  volume of the left apical pneumothorax has not significantly changed. No mediastinal shift or focal airspace disease. Bilateral breast implants are noted. IMPRESSION: Similar size of left-sided hydropneumothorax with decreased air and possibly mildly increased fluid in the pleural space compared with yesterday. Left chest tube remains in place. Electronically Signed   By: Elmon Hagedorn M.D.   On: 12/06/2023 14:31   DG CHEST PORT 1 VIEW Result Date: 12/05/2023 CLINICAL DATA:  Follow up pneumothorax EXAM: PORTABLE CHEST 1 VIEW COMPARISON:  12/05/2023, 3:05 p.m. FINDINGS: Left basilar chest tube in place. Persistent pneumothorax is noted partially loculated at the base. There is a decrease in the pleural separation at the apex. Cardiac silhouette is enlarged. No pleural effusions. Calcified aorta. Left-sided pacer. IMPRESSION: Left-sided pneumothorax may be slightly smaller compared to the prior study. Electronically Signed   By: Sydell Eva M.D.   On: 12/05/2023 21:43   DG CHEST PORT 1 VIEW Result Date: 12/05/2023 CLINICAL DATA:  Status post chest tube placement EXAM: PORTABLE CHEST 1 VIEW COMPARISON:  CT of earlier today.  Plain film of yesterday. FINDINGS: Pacer/ICD. Normal heart size. Atherosclerosis in the transverse aorta. Placement of a left-sided pigtail pleural catheter which terminates inferiorly and medially. The moderate left sided pneumothorax is felt to be similar, given differences in technique. Apical component measures 3.8 cm and inferolateral component measures maximally 3.5 cm. Patchy left sided atelectasis. Nonspecific mild interstitial thickening/coarsening. IMPRESSION: Placement of a left-sided pigtail pleural catheter, with similar moderate (30%) left pneumothorax. Aortic Atherosclerosis (ICD10-I70.0). Electronically Signed   By: Lore Rode M.D.   On: 12/05/2023 17:48   CT CHEST WO CONTRAST Result Date: 12/05/2023 CLINICAL DATA:  Pleural effusion, pneumothorax EXAM:  CT CHEST  WITHOUT CONTRAST TECHNIQUE: Multidetector CT imaging of the chest was performed following the standard protocol without IV contrast. RADIATION DOSE REDUCTION: This exam was performed according to the departmental dose-optimization program which includes automated exposure control, adjustment of the mA and/or kV according to patient size and/or use of iterative reconstruction technique. COMPARISON:  12/04/2023, 08/08/2023 FINDINGS: Cardiovascular: Unenhanced imaging of the heart is unremarkable without pericardial effusion. Multi lead pacemaker again identified. Normal caliber of the thoracic aorta. Atherosclerosis of the aorta and coronary vasculature. Assessment of the vascular lumen cannot be performed without IV contrast. Mediastinum/Nodes: No enlarged mediastinal or axillary lymph nodes. Thyroid  gland, trachea, and esophagus demonstrate no significant findings. Lungs/Pleura: A large left-sided hydropneumothorax is again identified, unchanged since recent x-ray and CT. Volume of fluid estimated greater than 2 L. Dense consolidation and volume loss within the lingula and left lower lobe compatible with atelectasis. No tension effect or mediastinal shift. Trace free-flowing right pleural effusion. Minimal dependent right lower lobe atelectasis. No right-sided pneumothorax. The central airways are patent. Upper Abdomen: Diffuse high attenuation of the liver parenchyma consistent with history of amiodarone  therapy. High attenuation material within the gallbladder may reflect sludge. Musculoskeletal: No acute or destructive bony abnormalities. Diffuse thoracic spondylosis with flowing anterior osteophytes. Reconstructed images demonstrate no additional findings. IMPRESSION: 1. Large left-sided hydropneumothorax, with fluid component estimated greater than 2 L, not appreciably changed since prior imaging. Dense consolidation volume loss within the lingula and left lower lobe compatible with atelectasis, and may reflect  underlying trapped lung. No tension effect or mediastinal shift. 2. Trace free-flowing right pleural effusion and minimal dependent right lower lobe atelectasis. 3. Increased attenuation of liver parenchyma consistent with history of amiodarone  therapy. 4. Gallbladder sludge.  No evidence of cholecystitis. 5. Aortic Atherosclerosis (ICD10-I70.0). Coronary artery atherosclerosis. Electronically Signed   By: Bobbye Burrow M.D.   On: 12/05/2023 15:14   ECHOCARDIOGRAM COMPLETE Result Date: 12/05/2023    ECHOCARDIOGRAM REPORT   Patient Name:   ADMIRE HUME Date of Exam: 12/05/2023 Medical Rec #:  [id number]          Height:       61.0 in Accession #:    [id number]         Weight:       169.0 lb Date of Birth:  07/16/1941           BSA:          1.758 m Patient Age:    82 years           BP:           133/62 mmHg Patient Gender: F                  HR:           67 bpm. Exam Location:  Inpatient Procedure: 2D Echo, Cardiac Doppler, Color Doppler and Intracardiac            Opacification Agent (Both Spectral and Color Flow Doppler were            utilized during procedure). Indications:    Ventricular Tachycardia I47.2  History:        Patient has prior history of Echocardiogram examinations, most                 recent 08/09/2023. CHF, Angina, Defibrillator, CKD, stage 3,                 Arrythmias:Atrial Fibrillation, Ventricular Fibrillation, PVC  and Tachycardia; Risk Factors:Hypertension.  Sonographer:    Terrilee Few RCS Referring Phys: [id number] PRAVEEN MANNAM IMPRESSIONS  1. Left ventricular ejection fraction, by estimation, is 40 to 45%. The left ventricle has mildly decreased function. The left ventricle has no regional wall motion abnormalities. Left ventricular diastolic parameters are consistent with Grade I diastolic dysfunction (impaired relaxation).  2. Right ventricular systolic function is normal. The right ventricular size is normal.  3. The mitral valve is normal in structure. No  evidence of mitral valve regurgitation. No evidence of mitral stenosis.  4. The aortic valve is normal in structure. Aortic valve regurgitation is not visualized. No aortic stenosis is present.  5. The inferior vena cava is normal in size with greater than 50% respiratory variability, suggesting right atrial pressure of 3 mmHg. Comparison(s): No significant change from prior study. Prior images reviewed side by side. FINDINGS  Left Ventricle: Left ventricular ejection fraction, by estimation, is 40 to 45%. The left ventricle has mildly decreased function. The left ventricle has no regional wall motion abnormalities. Definity contrast agent was given IV to delineate the left ventricular endocardial borders. The left ventricular internal cavity size was normal in size. There is no left ventricular hypertrophy. Left ventricular diastolic parameters are consistent with Grade I diastolic dysfunction (impaired relaxation).  LV Wall Scoring: The posterior wall is akinetic. Right Ventricle: The right ventricular size is normal. No increase in right ventricular wall thickness. Right ventricular systolic function is normal. Left Atrium: Left atrial size was normal in size. Right Atrium: Right atrial size was normal in size. Pericardium: There is no evidence of pericardial effusion. Mitral Valve: The mitral valve is normal in structure. No evidence of mitral valve regurgitation. No evidence of mitral valve stenosis. Tricuspid Valve: The tricuspid valve is normal in structure. Tricuspid valve regurgitation is not demonstrated. No evidence of tricuspid stenosis. Aortic Valve: The aortic valve is normal in structure. Aortic valve regurgitation is not visualized. No aortic stenosis is present. Aortic valve peak gradient measures 7.3 mmHg. Pulmonic Valve: The pulmonic valve was normal in structure. Pulmonic valve regurgitation is not visualized. No evidence of pulmonic stenosis. Aorta: The aortic root is normal in size and  structure. Venous: The inferior vena cava is normal in size with greater than 50% respiratory variability, suggesting right atrial pressure of 3 mmHg. IAS/Shunts: No atrial level shunt detected by color flow Doppler. Additional Comments: A device lead is visualized in the right ventricle.  LEFT VENTRICLE PLAX 2D LVIDd:         4.40 cm      Diastology LVIDs:         3.60 cm      LV e' medial:    6.42 cm/s LV PW:         0.90 cm      LV E/e' medial:  10.7 LV IVS:        0.90 cm      LV e' lateral:   6.31 cm/s LVOT diam:     2.00 cm      LV E/e' lateral: 10.9 LV SV:         60 LV SV Index:   34 LVOT Area:     3.14 cm  LV Volumes (MOD) LV vol d, MOD A2C: 130.0 ml LV vol d, MOD A4C: 135.0 ml LV vol s, MOD A2C: 80.1 ml LV vol s, MOD A4C: 73.7 ml LV SV MOD A2C:     49.9 ml LV SV MOD A4C:  135.0 ml LV SV MOD BP:      58.4 ml RIGHT VENTRICLE             IVC RV S prime:     13.70 cm/s  IVC diam: 2.20 cm TAPSE (M-mode): 2.7 cm LEFT ATRIUM             Index        RIGHT ATRIUM           Index LA diam:        3.40 cm 1.93 cm/m   RA Area:     11.70 cm LA Vol (A2C):   32.7 ml 18.60 ml/m  RA Volume:   22.00 ml  12.51 ml/m LA Vol (A4C):   38.0 ml 21.61 ml/m LA Biplane Vol: 38.6 ml 21.95 ml/m  AORTIC VALVE AV Area (Vmax): 2.05 cm AV Vmax:        135.00 cm/s AV Peak Grad:   7.3 mmHg LVOT Vmax:      88.17 cm/s LVOT Vmean:     60.700 cm/s LVOT VTI:       0.190 m  AORTA Ao Root diam: 2.90 cm Ao Asc diam:  3.00 cm MITRAL VALVE               TRICUSPID VALVE MV Area (PHT): 3.65 cm    TR Peak grad:   7.7 mmHg MV Decel Time: 208 msec    TR Vmax:        139.00 cm/s MV E velocity: 68.80 cm/s MV A velocity: 75.30 cm/s  SHUNTS MV E/A ratio:  0.91        Systemic VTI:  0.19 m                            Systemic Diam: 2.00 cm Dorothye Gathers MD Electronically signed by Dorothye Gathers MD Signature Date/Time: 12/05/2023/11:11:21 AM    Final      LOS: 3 days   Kimberly Penna, MD  [facility]    To contact the attending provider  between 7A-7P or the covering provider during after hours 7P-7A, please log into the web site [email or web address] and access using universal [place] password for that web site. If you do not have the password, please call the hospital operator.  12/07/2023, 9:38 AM

## 2023-12-07 NOTE — Evaluation (Signed)
 Occupational Therapy Evaluation Patient Details Name: Lisa [name] MRN: [id number] DOB: [birth year] Today's Date: 12/07/2023   History of Present Illness   Pt is an 83 year old woman admitted 12/04/23  with R sided flank pain and nausea. CT renal study showed large L hydropneumothorax. Chest tube placed 4/27. Pt with V tach and ICD firing during admission requiring brief ICU stay for amiodarone  infusion. Of note, pt had thoracentesis 2 days prior.  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     Clinical Impressions PTA, pt lives alone, typically ambulatory with cane vs rollator and reports Modified Independence with ADLs. After prior hospitalization, pt reports having aide assistance with ADLs/IADLs but less assistance is needed now given progress at home. Pt presents now with minor deficits in strength, balance and cardiopulmonary endurance. Pt requires Mod A for bed mobility, Min A progressing to CGA for transfers using RW and no more than Min A for LB ADLs. Anticipate good progress with continued OOB mobility during admission w/ no OT follow up needed at DC.   Spo2 94% on RA with activity, HR 70s    If plan is discharge home, recommend the following:   Assistance with cooking/housework;Assist for transportation;A little help with bathing/dressing/bathroom     Functional Status Assessment   Patient has had a recent decline in their functional status and demonstrates the ability to make significant improvements in function in a reasonable and predictable amount of time.     Equipment Recommendations   None recommended by OT     Recommendations for Other Services         Precautions/Restrictions   Precautions Precautions: Fall;Other (comment) Recall of Precautions/Restrictions: Intact Precaution/Restrictions Comments: chest tube Restrictions Weight Bearing Restrictions Per Provider Order: No      Mobility Bed Mobility Overal bed mobility: Needs Assistance Bed Mobility: Supine to Sit     Supine to sit: Mod assist, HOB elevated, Used rails     General bed mobility comments: assist to bring LE to EOB and handheld assist to lift trunk    Transfers Overall transfer level: Needs assistance Equipment used: Rolling walker (2 wheels), Rollator (4 wheels) Transfers: Bed to chair/wheelchair/BSC, Sit to/from Stand Sit to Stand: Min assist, Contact guard assist     Step pivot transfers: Contact guard assist, Min assist     General transfer comment: Initially Min A for transfer bed to [facility] with RW. able to progress to CGA for transfer to chair from [facility] with RW. One final stand with rollator for purewick adjustment with no assist needed      Balance Overall balance assessment: Needs assistance Sitting-balance support: No upper extremity supported, Feet supported Sitting balance-Leahy Scale: Fair     Standing balance support: Bilateral upper extremity supported, During functional activity, Single extremity supported, No upper extremity supported Standing balance-Leahy Scale: Poor                             ADL either performed or assessed with clinical judgement   ADL Overall ADL's : Needs assistance/impaired Eating/Feeding: Independent   Grooming: Set up;Sitting   Upper Body Bathing: Set up;Sitting   Lower Body Bathing: Contact guard assist;Sit to/from stand;Sitting/lateral leans   Upper Body Dressing : Set up;Sitting   Lower Body Dressing: Minimal assistance;Sit to/from stand;Sitting/lateral leans Lower Body Dressing Details (indicate cue type and reason): sock assist Toilet Transfer: Contact guard assist;Minimal  assistance;Stand-pivot;BSC/3in1;Rolling walker (2 wheels) Toilet Transfer Details (indicate cue type and reason): urinary urgency so used BSC w/ some incontinence noted during transfer. able to progress from Min A to CGA for transfer off of  BSC Toileting- Clothing Manipulation and Hygiene: Contact guard assist;Sitting/lateral lean;Sit to/from stand Toileting - Clothing Manipulation Details (indicate cue type and reason): able to perform hygiene in standing, manage clothing with CGA for safety             Vision Baseline Vision/History: 1 Wears glasses Ability to See in Adequate Light: 0 Adequate Patient Visual Report: No change from baseline Vision Assessment?: No apparent visual deficits     Perception         Praxis         Pertinent Vitals/Pain Pain Assessment Pain Assessment: No/denies pain     Extremity/Trunk Assessment Upper Extremity Assessment Upper Extremity Assessment: Overall WFL for tasks assessed;Right hand dominant   Lower Extremity Assessment Lower Extremity Assessment: Defer to PT evaluation   Cervical / Trunk Assessment Cervical / Trunk Assessment: Normal   Communication Communication Communication: No apparent difficulties   Cognition Arousal: Alert Behavior During Therapy: WFL for tasks assessed/performed Cognition: No apparent impairments                               Following commands: Intact       Cueing  General Comments   Cueing Techniques: Verbal cues;Gestural cues  VSS on RA   Exercises     Shoulder Instructions      Home Living Family/patient expects to be discharged to:: Private residence Living Arrangements: Alone Available Help at Discharge: Family;Available PRN/intermittently;Personal care attendant Type of Home: House Home Access: Stairs to enter [employer] of Steps: 3 Entrance Stairs-Rails: Right Home Layout: Two level;Able to live on main level with bedroom/bathroom     Bathroom Shower/Tub: Producer, television/film/video: Standard Bathroom Accessibility: Yes How Accessible: Accessible via walker Home Equipment: Rollator (4 wheels);Shower seat;Cane - single point   Additional Comments: Has had aide assist after  prior hospitalization      Prior Functioning/Environment Prior Level of Function : Independent/Modified Independent;Driving             Mobility Comments: ambulatory with cane vs rollator at home. participatory in OP PT ADLs Comments: Modified Independent with ADLs, IADLs and driving usually. after prior hospitalization, did require aide assist for ADLs but has progress in recent months back to Modified Independent baseline    OT Problem List: Decreased strength;Decreased activity tolerance;Impaired balance (sitting and/or standing);Cardiopulmonary status limiting activity   OT Treatment/Interventions: Self-care/ADL training;Therapeutic exercise;Energy conservation;DME and/or AE instruction;Therapeutic activities;Patient/family education;Balance training      OT Goals(Current goals can be found in the care plan section)   Acute Rehab OT Goals Patient Stated Goal: be able to get up and walk to bathroom OT Goal Formulation: With patient Time For Goal Achievement: 12/21/23 Potential to Achieve Goals: Good ADL Goals Pt Will Perform Lower Body Bathing: with modified independence;sit to/from stand;sitting/lateral leans Pt Will Perform Lower Body Dressing: with modified independence;sitting/lateral leans;sit to/from stand Pt Will Transfer to Toilet: with modified independence;ambulating   OT Frequency:  Min 2X/week    Co-evaluation              AM-PAC OT "6 Clicks" Daily Activity     Outcome Measure Help from another person eating meals?: None Help from another person taking care of personal grooming?: A  Little Help from another person toileting, which includes using toliet, bedpan, or urinal?: A Little Help from another person bathing (including washing, rinsing, drying)?: A Little Help from another person to put on and taking off regular upper body clothing?: A Little Help from another person to put on and taking off regular lower body clothing?: A Little 6 Click Score:  19   End of Session Equipment Utilized During Treatment: Rolling walker (2 wheels) Nurse Communication: Mobility status;Other (comment) (pt inquiring if chest tube container too full)  Activity Tolerance: Patient tolerated treatment well Patient left: in chair;with call bell/phone within reach  OT Visit Diagnosis: Unsteadiness on feet (R26.81);Other abnormalities of gait and mobility (R26.89);Muscle weakness (generalized) (M62.81)                Time: 1610-9604 OT Time Calculation (min): 29 min Charges:  OT General Charges $OT Visit: 1 Visit OT Evaluation $OT Eval Moderate Complexity: 1 Mod OT Treatments $Self Care/Home Management : 8-22 mins  [name], OTR/L Acute Rehab Services Office: [phone]   [name] 12/07/2023, 12:41 PM

## 2023-12-08 ENCOUNTER — Ambulatory Visit (HOSPITAL_BASED_OUTPATIENT_CLINIC_OR_DEPARTMENT_OTHER): Admitting: Physical Therapy

## 2023-12-08 ENCOUNTER — Inpatient Hospital Stay (HOSPITAL_COMMUNITY)

## 2023-12-08 DIAGNOSIS — I4901 Ventricular fibrillation: Secondary | ICD-10-CM | POA: Diagnosis not present

## 2023-12-08 DIAGNOSIS — J9383 Other pneumothorax: Secondary | ICD-10-CM | POA: Diagnosis not present

## 2023-12-08 DIAGNOSIS — I10 Essential (primary) hypertension: Secondary | ICD-10-CM | POA: Diagnosis not present

## 2023-12-08 DIAGNOSIS — I5033 Acute on chronic diastolic (congestive) heart failure: Secondary | ICD-10-CM | POA: Diagnosis not present

## 2023-12-08 DIAGNOSIS — J948 Other specified pleural conditions: Secondary | ICD-10-CM | POA: Diagnosis not present

## 2023-12-08 LAB — BASIC METABOLIC PANEL WITH GFR
Anion gap: 9 (ref 5–15)
BUN: 17 mg/dL (ref 8–23)
CO2: 25 mmol/L (ref 22–32)
Calcium: 7.9 mg/dL — ABNORMAL LOW (ref 8.9–10.3)
Chloride: 102 mmol/L (ref 98–111)
Creatinine, Ser: 1.55 mg/dL — ABNORMAL HIGH (ref 0.44–1.00)
GFR, Estimated: 33 mL/min — ABNORMAL LOW (ref >60)
Glucose, Bld: 116 mg/dL — ABNORMAL HIGH (ref 70–99)
Potassium: 3.5 mmol/L (ref 3.5–5.1)
Sodium: 136 mmol/L (ref 135–145)

## 2023-12-08 LAB — CBC
HCT: 24.9 % — ABNORMAL LOW (ref 36.0–46.0)
Hemoglobin: 8.1 g/dL — ABNORMAL LOW (ref 12.0–15.0)
MCH: 31.8 pg (ref 26.0–34.0)
MCHC: 32.5 g/dL (ref 30.0–36.0)
MCV: 97.6 fL (ref 80.0–100.0)
Platelets: 172 10*3/uL (ref 150–400)
RBC: 2.55 MIL/uL — ABNORMAL LOW (ref 3.87–5.11)
RDW: 15.4 % (ref 11.5–15.5)
WBC: 5.6 10*3/uL (ref 4.0–10.5)
nRBC: 0 % (ref 0.0–0.2)

## 2023-12-08 LAB — MAGNESIUM: Magnesium: 2 mg/dL (ref 1.7–2.4)

## 2023-12-08 MED ORDER — SENNA 8.6 MG PO TABS
2.0000 | ORAL_TABLET | Freq: Every day | ORAL | Status: DC
  Administered 2023-12-08 – 2023-12-12 (×4): 17.2 mg via ORAL
  Filled 2023-12-08 (×7): qty 2

## 2023-12-08 MED ORDER — POTASSIUM CHLORIDE CRYS ER 20 MEQ PO TBCR: 40.0000 meq | EXTENDED_RELEASE_TABLET | Freq: Two times a day (BID) | ORAL | Status: DC

## 2023-12-08 MED ORDER — POTASSIUM CHLORIDE CRYS ER 20 MEQ PO TBCR
40.0000 meq | EXTENDED_RELEASE_TABLET | Freq: Four times a day (QID) | ORAL | Status: AC
  Administered 2023-12-08 (×2): 40 meq via ORAL
  Filled 2023-12-08 (×2): qty 2

## 2023-12-08 MED ORDER — BISACODYL 10 MG RE SUPP
10.0000 mg | Freq: Every day | RECTAL | Status: DC | PRN
Start: 2023-12-08 — End: 2023-12-16

## 2023-12-08 MED ORDER — POTASSIUM CHLORIDE CRYS ER 20 MEQ PO TBCR: 40.0000 meq | EXTENDED_RELEASE_TABLET | Freq: Every day | ORAL | Status: DC

## 2023-12-08 MED ORDER — POTASSIUM CHLORIDE CRYS ER 20 MEQ PO TBCR
40.0000 meq | EXTENDED_RELEASE_TABLET | Freq: Every day | ORAL | Status: DC
  Administered 2023-12-09 – 2023-12-15 (×7): 40 meq via ORAL
  Filled 2023-12-08 (×7): qty 2

## 2023-12-08 MED ORDER — POLYETHYLENE GLYCOL 3350 17 G PO PACK
17.0000 g | PACK | Freq: Two times a day (BID) | ORAL | Status: DC
  Administered 2023-12-08 – 2023-12-12 (×10): 17 g via ORAL
  Filled 2023-12-08 (×14): qty 1

## 2023-12-08 NOTE — Progress Notes (Signed)
 PROGRESS NOTE        PATIENT DETAILS Name: Lisa [name] Age: 83 y.o. Sex: female Date of Birth: Jun 11, 1941 Admit Date: 12/04/2023 Admitting Physician Johnetta Nab, MD JYN:WGNFA, Dossie Gayer, DO  Brief Summary: Patient is a 83 y.o.  female with history of prior cardiac arrest, V. tach/V-fib-s/p ICD placement, HTN, chronic mild systolic/diastolic heart failure, CML, hypothyroidism-who had a IR paracentesis on 4/25-presented to the hospital on 4/26 with right flank/back pain-she was subsequently found to have a large left hydropneumothorax-requiring chest tube placement-she was also found to have V. tach with repeated firing of her implanted ICD-she was started on amiodarone  infusion and subsequently transferred to the ICU.  She was stabilized and transferred back to [facility] on 4/28  Significant events: 4/25>> thoracocentesis by IR-1.45 L removed. 4/26>> admit to [facility]-hydropneumothorax 4/26>> V. tach-ICD firing-amiodarone  infusion-transfer to ICU 4/27>> CT chest inserted by CCM 4/28>> transferred to [facility]  Significant studies: 4/26>> CT abdomen/pelvis: Large left hydropneumothorax. 4/27>> CT chest: Large left-sided hydropneumothorax with fluid component greater than 2 L.  Significant microbiology data: 4/26>> culture<10,000 colonies/mL insignificant growth 4/26>> blood culture: No growth 8/27>> pleural fluid culture: No growth  Procedures: 4/27>> CT chest inserted by CCM  Consults: PCCM Cardiology/EP  Subjective: Has not had a BM in almost a week-1 episode of vomiting earlier this morning.  Objective: Vitals: Blood pressure (!) 139/59, pulse 74, temperature 98 F (36.7 C), temperature source Oral, resp. rate 20, height 5\' 1"  (1.549 m), weight 77.7 kg, last menstrual period 02/14/1995, SpO2 94%.   Exam: Gen Exam:Alert awake-not in any distress HEENT:atraumatic, normocephalic Chest: B/L clear to auscultation anteriorly CVS:S1S2  regular Abdomen:soft non tender, non distended Extremities: Chronic venous stasis changes/lymphedema Neurology: Non focal Skin: no rash  Pertinent Labs/Radiology:    Latest Ref Rng & Units 12/08/2023    4:24 AM 12/07/2023    4:24 AM 12/06/2023    3:23 AM  CBC  WBC 4.0 - 10.5 K/uL 5.6  5.3  5.4   Hemoglobin 12.0 - 15.0 g/dL 8.1  8.3  7.9   Hematocrit 36.0 - 46.0 % 24.9  25.7  25.3   Platelets 150 - 400 K/uL 172  176  165     Lab Results  Component Value Date   NA 136 12/08/2023   K 3.5 12/08/2023   CL 102 12/08/2023   CO2 25 12/08/2023     Assessment/Plan: Recurrent ventricular tachycardia ICD in place No further V. tach Paced rhythm Attempt to keep keep K> 4, Mg> 2-will K accordingly today.  No longer on IV amiodarone -has been switched to oral amiodarone .  Patient aware of driving restriction  Pneumothorax ex vacuo S/p left-sided chest tube placement Suspected trapped lung likely due to inflammation/scarring from prolonged effusion. CXR continues to show unchanged pneumothorax PCCM contemplating hooking her back to suction-await further recommendations this morning.  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 Cytology negative this admit-prior cytologies x 3 negative as well.   Chronic combined systolic/diastolic heart failure (EF 40-45% with grade 1 diastolic dysfunction on 4/27) Euvolemic on exam-but does have chronic lymphedema at baseline. Continue oral diuretics.  HLD Statin  Constipation Vomiting x 1 this morning-has not had a BM in 7 days  passing flatus Abdomen not distended-benign exam. Change MiraLAX  to twice daily Add senna As needed Dulcolax Reassess on  5/01  UTI Afebrile/no leukocytosis Rocephin  x 3 doses completed on 4/29  CML Gleevec  Follow CBC periodically  Hypothyroidism Armour Thyroid   CKD stage IIIb Creatinine close to baseline AKI ruled out.  Chronic  venous stasis changes in bilateral lower extremities/lymphedema Diuretics  Debility/deconditioning PT/OT eval appreciated  Class 1 Obesity: Estimated body mass index is 32.37 kg/m as calculated from the following:   Height as of this encounter: 5\' 1"  (1.549 m).   Weight as of this encounter: 77.7 kg.   Code status:   Code Status: Full Code   DVT Prophylaxis: heparin  injection 5,000 Units Start: 12/04/23 2200   Family Communication: Daughter-Shannon-[phone] updated over the phone on 4/30.   Disposition Plan: Status is: Inpatient Remains inpatient appropriate because: Severity of illness   Planned Discharge Destination:Home   Diet: Diet Order             Diet Heart Room service appropriate? Yes; Fluid consistency: Thin; Fluid restriction: 1800 mL Fluid  Diet effective now                     Antimicrobial agents: Anti-infectives (From admission, onward)    Start     Dose/Rate Route Frequency Ordered Stop   12/05/23 1400  cefTRIAXone  (ROCEPHIN ) 2 g in sodium chloride  0.9 % 100 mL IVPB        2 g 200 mL/hr over 30 Minutes Intravenous Every 24 hours 12/05/23 1312 12/07/23 1615   12/04/23 1732  cephALEXin (KEFLEX) capsule 500 mg  Status:  Discontinued        500 mg Oral Every 12 hours 12/04/23 1741 12/05/23 1312        MEDICATIONS: Scheduled Meds:  amiodarone   400 mg Oral BID   Followed by   Cecily Cohen ON 12/11/2023] amiodarone   200 mg Oral BID   atorvastatin   20 mg Oral Daily   Chlorhexidine  Gluconate Cloth  6 each Topical Daily   heparin   5,000 Units Subcutaneous Q8H   imatinib   400 mg Oral QHS   polyethylene glycol  17 g Oral BID   potassium chloride   40 mEq Oral Q6H   [START ON 12/09/2023] potassium chloride   40 mEq Oral Daily   senna  2 tablet Oral Daily   thyroid   15 mg Oral QAC breakfast   torsemide   40 mg Oral Daily   Continuous Infusions:   PRN Meds:.acetaminophen  **OR** acetaminophen , alum & mag hydroxide-simeth, morphine injection,  nitroGLYCERIN , mouth rinse, prochlorperazine    I have personally reviewed following labs and imaging studies  LABORATORY DATA: CBC: Recent Labs  Lab 12/04/23 1144 12/04/23 1821 12/05/23 0247 12/06/23 0323 12/07/23 0424 12/08/23 0424  WBC 4.7 7.0 5.0 5.4 5.3 5.6  NEUTROABS 3.6  --   --   --   --   --   HGB 8.9* 10.2* 8.2* 7.9* 8.3* 8.1*  HCT 27.1* 30.2* 25.5* 25.3* 25.7* 24.9*  MCV 96.8 95.0 97.3 99.2 97.0 97.6  PLT 173 264 169 165 176 172    Basic Metabolic Panel: Recent Labs  Lab 12/04/23 1903 12/05/23 0247 12/05/23 1721 12/06/23 0323 12/07/23 0424 12/08/23 0424  NA 137 135 136 136 136 136  K 3.3* 4.6 4.5 4.3 3.5 3.5  CL 102 104 102 103 102 102  CO2 21* 22 22 23 24 25   GLUCOSE 133* 109* 128* 122* 109* 116*  BUN 13 16 17 15 16 17   CREATININE 1.55* 1.49* 1.70* 1.81* 1.66* 1.55*  CALCIUM  8.6* 8.2* 8.3* 8.1* 8.1* 7.9*  MG 1.7  2.3  --  2.2 1.9 2.0    GFR: Estimated Creatinine Clearance: 26.9 mL/min (A) (by C-G formula based on SCr of 1.55 mg/dL (H)).  Liver Function Tests: Recent Labs  Lab 12/04/23 1144 12/04/23 1903 12/05/23 0247  AST 29 31 25   ALT 24 23 21   ALKPHOS 78 58 46  BILITOT 0.9 1.2 0.7  PROT 6.3* 6.4* 5.3*  ALBUMIN  3.8 3.4* 2.8*   No results for input(s): "LIPASE", "AMYLASE" in the last 168 hours. No results for input(s): "AMMONIA" in the last 168 hours.  Coagulation Profile: No results for input(s): "INR", "PROTIME" in the last 168 hours.  Cardiac Enzymes: No results for input(s): "CKTOTAL", "CKMB", "CKMBINDEX", "TROPONINI" in the last 168 hours.  BNP (last 3 results) Recent Labs    12/04/23 1144  PROBNP 4,071.0*    Lipid Profile: No results for input(s): "CHOL", "HDL", "LDLCALC", "TRIG", "CHOLHDL", "LDLDIRECT" in the last 72 hours.  Thyroid  Function Tests: No results for input(s): "TSH", "T4TOTAL", "FREET4", "T3FREE", "THYROIDAB" in the last 72 hours.   Anemia Panel: No results for input(s): "VITAMINB12", "FOLATE",  "FERRITIN", "TIBC", "IRON", "RETICCTPCT" in the last 72 hours.  Urine analysis:    Component Value Date/Time   COLORURINE YELLOW 12/04/2023 1202   APPEARANCEUR CLEAR 12/04/2023 1202   LABSPEC 1.010 12/04/2023 1202   PHURINE 8.0 12/04/2023 1202   GLUCOSEU NEGATIVE 12/04/2023 1202   HGBUR NEGATIVE 12/04/2023 1202   BILIRUBINUR NEGATIVE 12/04/2023 1202   BILIRUBINUR negative 06/04/2016 0847   BILIRUBINUR neg 02/14/2015 1431   KETONESUR 15 (A) 12/04/2023 1202   PROTEINUR TRACE (A) 12/04/2023 1202   UROBILINOGEN 0.2 06/04/2016 0847   NITRITE NEGATIVE 12/04/2023 1202   LEUKOCYTESUR NEGATIVE 12/04/2023 1202    Sepsis Labs: Lactic Acid, Venous    Component Value Date/Time   LATICACIDVEN 1.4 08/08/2023 2150    MICROBIOLOGY: Recent Results (from the past 240 hours)  Urine Culture     Status: Abnormal   Collection Time: 12/04/23 12:02 PM   Specimen: Urine, Clean Catch  Result Value Ref Range Status   Specimen Description   Final    URINE, CLEAN CATCH Performed at [facility], [address], [place], [place] [address]    Special Requests   Final    NONE Performed at [facility], [address], [place], [place] [address]    Culture (A)  Final    <10,000 COLONIES/mL INSIGNIFICANT GROWTH Performed at [facility], [address] [place], [place] [address]    Report Status 12/05/2023 FINAL  Final  MRSA Next Gen by PCR, Nasal     Status: None   Collection Time: 12/04/23  6:52 PM   Specimen: Nasal Mucosa; Nasal Swab  Result Value Ref Range Status   MRSA by PCR Next Gen NOT DETECTED NOT DETECTED Final    Comment: (NOTE) The GeneXpert MRSA Assay (FDA approved for NASAL specimens only), is one component of a comprehensive MRSA colonization surveillance program. It is not intended to diagnose MRSA infection nor to guide or monitor treatment for MRSA infections. Test performance is not FDA approved in patients less than 74  years old. Performed at [facility], [address] [place], [place] [address]   Culture, blood (Routine X 2) w Reflex to ID Panel     Status: None (Preliminary result)   Collection Time: 12/04/23  7:03 PM   Specimen: BLOOD  Result Value Ref Range Status   Specimen Description BLOOD SITE NOT SPECIFIED  Final  Special Requests   Final    BOTTLES DRAWN AEROBIC ONLY Blood Culture results may not be optimal due to an inadequate volume of blood received in culture bottles   Culture   Final    NO GROWTH 4 DAYS Performed at [facility], [address], [place], [place] [address]    Report Status PENDING  Incomplete  Culture, blood (Routine X 2) w Reflex to ID Panel     Status: None (Preliminary result)   Collection Time: 12/04/23  7:11 PM   Specimen: BLOOD  Result Value Ref Range Status   Specimen Description BLOOD SITE NOT SPECIFIED  Final   Special Requests   Final    BOTTLES DRAWN AEROBIC ONLY Blood Culture results may not be optimal due to an inadequate volume of blood received in culture bottles   Culture   Final    NO GROWTH 4 DAYS Performed at [facility], [address] [place], [place] [address]    Report Status PENDING  Incomplete  Body fluid culture w Gram Stain     Status: None (Preliminary result)   Collection Time: 12/05/23  2:59 PM   Specimen: Pleural Fluid  Result Value Ref Range Status   Specimen Description PLEURAL  Final   Special Requests NONE  Final   Gram Stain NO WBC SEEN NO ORGANISMS SEEN   Final   Culture   Final    NO GROWTH 2 DAYS Performed at [facility], [address], [place], [place] [address]    Report Status PENDING  Incomplete    RADIOLOGY STUDIES/RESULTS: DG Chest Port 1V same Day Result Date: 12/08/2023 CLINICAL DATA:  [id number] Pneumohemothorax [id number] for EXAM: PORTABLE CHEST 1 VIEW COMPARISON:  None available. FINDINGS: Left chest pacemaker/AICD with leads terminating in the right atrium, right ventricle, and  coronary sinus. Lower lung volumes. Bronchovascular crowding. Moderate-sized left apical pneumothorax measuring 4 cm (previously 3.7 cm). No cardiomegaly. Left-sided small bore thoracostomy tube similarly positioned in the medial left lung apex. Small amount of left pleural fluid. No acute fracture or destructive lesion. Multilevel thoracic osteophytosis. IMPRESSION: Similar size of the left hydropneumothorax measuring 4 cm at the lung apex (previously, 3.8 cm). Small bore left-sided thoracostomy tube is also similarly positioned in the medial left lung apex. Electronically Signed   By: Rance Burrows M.D.   On: 12/08/2023 09:13   DG Chest Port 1 View Result Date: 12/07/2023 CLINICAL DATA:  Pleural effusion EXAM: PORTABLE CHEST 1 VIEW COMPARISON:  12/07/2023 FINDINGS: Single frontal view of the chest demonstrates stable pigtail drainage catheter overlying the medial left lung base. Stable left hydropneumothorax. No acute airspace disease. The cardiac silhouette is stable. Multi lead pacer/AICD unchanged. IMPRESSION: 1. Stable left hydropneumothorax. Left pigtail drainage catheter unchanged in position. Electronically Signed   By: Bobbye Burrow M.D.   On: 12/07/2023 18:52   DG Chest Port 1 View Result Date: 12/07/2023 CLINICAL DATA:  Follow-up left pleural effusion EXAM: PORTABLE CHEST 1 VIEW COMPARISON:  12/06/2023 FINDINGS: Pigtail catheter is again noted on the left. Persistent hydropneumothorax is seen on the left stable from the prior study. Defibrillator is again seen. Cardiac shadow is stable. Lungs are otherwise clear. IMPRESSION: Persistent left hydropneumothorax stable from the prior study. Electronically Signed   By: Violeta Grey M.D.   On: 12/07/2023 08:49   DG Chest Port 1 View Result Date: 12/06/2023 CLINICAL DATA:  Follow up pleural effusion. EXAM: PORTABLE CHEST 1 VIEW COMPARISON:  Radiographs 12/05/2023  and 12/04/2023.  CT 12/05/2023 FINDINGS: 1039 hours. Left subclavian AICD leads are  unchanged in position. Small caliber left pleural catheter is unchanged in position, projecting over the left hemidiaphragm. Possible mild re-accumulation of pleural fluid at the costophrenic angle on the left. The volume of the left apical pneumothorax has not significantly changed. No mediastinal shift or focal airspace disease. Bilateral breast implants are noted. IMPRESSION: Similar size of left-sided hydropneumothorax with decreased air and possibly mildly increased fluid in the pleural space compared with yesterday. Left chest tube remains in place. Electronically Signed   By: Elmon Hagedorn M.D.   On: 12/06/2023 14:31     LOS: 4 days   Kimberly Penna, MD  [facility]    To contact the attending provider between 7A-7P or the covering provider during after hours 7P-7A, please log into the web site [email or web address] and access using universal [place] password for that web site. If you do not have the password, please call the hospital operator.  12/08/2023, 10:13 AM

## 2023-12-08 NOTE — Progress Notes (Signed)
 NAME:  Lisa [name], MRN:  [id number], DOB:  11-29-1940, LOS: 4 ADMISSION DATE:  12/04/2023, CONSULTATION DATE:  12/04/2023 REFERRING MD:  Dr. Rufina Cough - TRh, CHIEF COMPLAINT:  Pleural effusion    History of Present Illness:  Lisa [name] is a 83 y.o. female with a past medical history significant for CHF, CAD, arrhythmias including V. tach, hypertension,, CML, and melanoma who presented to [facility] 4/26 for complaints of progressive right-sided flank and back pain that began 4 to 5 days prior to admission.  Of note patient underwent thoracentesis per interventional radiology 2 days prior to admission for large pleural effusion, 1500 mL were removed.  Appears patient had ex vacuo pneumo post thoracentesis.  I do not see in our system to see that any samples/cytology was obtained during thoracentesis.  She also had an urgent care visit at [facility] on 4/24 for similar complaints of back pain, flank pain and nausea where she was diagnosed with E. coli UTI and started on Keflex.    Patient was admitted per hospitalist with pulmonary critical care consulted for management of hydro pneumothorax On arrival to [facility] patient went into repeated V. tach with firing of her implanted ICD.  She was transferred to ICU, started on amiodarone  infusion  Pertinent  Medical History  CHF, CAD, arrhythmias including V. tach, hypertension,, CML, and melanoma  Significant Hospital Events: Including procedures, antibiotic start and stop dates in addition to other pertinent events   4/26 presented with progressive right sided flank pain with nausea and decreased oral intake.  CT renal study with large left hydropneumothorax   Interim History / Subjective:   Recorded 400cc/hr output from CT over last day  Objective   Blood pressure (!) 126/52, pulse 70, temperature 98 F (36.7 C), temperature source Oral, resp. rate 19, height 5\' 1"  (1.549 m), weight 77.7 kg, last menstrual period  02/14/1995, SpO2 97%.        Intake/Output Summary (Last 24 hours) at 12/08/2023 1656 Last data filed at 12/08/2023 0454 Gross per 24 hour  Intake --  Output 100 ml  Net -100 ml   Filed Weights   12/06/23 0500 12/07/23 0500 12/08/23 0500  Weight: 76.4 kg 84.4 kg 77.7 kg   Examination: Gen:      No acute distress, elderly HEENT:  EOMI, sclera anicteric Neck:     No masses; no thyromegaly Lungs:    Clear to auscultation bilaterally; normal respiratory effort CV:         Regular rate and rhythm; no murmurs Abd:      + bowel sounds; soft, non-tender; no palpable masses, no distension Ext:    No edema; adequate peripheral perfusion Skin:      Warm and dry; no rash Neuro: alert and oriented x 3  Labs/ CXR reviewed   CT renal 12/04/2023- large left hydro pneumothorax, no acute findings in the abdomen or pelvis Chest x-ray 12/04/2023- pneumothorax ex vacuo on the left. CT chest 12/05/23- left-sided hydropneumothorax with trapped lung Chest x-ray 4/30 with persistent left-sided pneumothorax  Resolved Hospital Problem list     Assessment & Plan:   Pneumothorax ex vacuo - Pneumothorax ex vacuo following IR thoracentesis 4/24 for left-sided pleural effusion.  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   - suspected trapped lung likely due to inflammation and scarring from prolonged effusion.  - pleural fluid 4/27 exudate by  protein on Lights, likely pseudo, transudate by LDH P:  - Chest x-ray is unchanged will place chest tube to waterseal.  She is concerned as the last time it was done she had pain - Will try -10 cc suction and discontinue if she develops pain - Follow-up chest x-ray tomorrow - Follow chest tube output  Remainder per primary team.  Pulmonary will continue to follow for CT management  Best Practice (right click and "Reselect all SmartList Selections" daily)  Per primary team-  TRH  Signature:   Traver Meckes MD [facility] See Amion for pager  If no response to pager , please call [phone] until 7pm After 7:00 pm call Elink  [phone] 12/08/2023, 4:56 PM

## 2023-12-08 NOTE — Plan of Care (Signed)
 Progressing
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
## 2023-12-08 NOTE — Progress Notes (Addendum)
   12/08/23 1553  Mobility  Activity Transferred from chair to bed  Level of Assistance Contact guard assist, steadying assist  Assistive Device Four wheel walker  Activity Response Tolerated fair  Mobility Referral Yes  Mobility visit 1 Mobility  Mobility Specialist Start Time (ACUTE ONLY) 1553  Mobility Specialist Stop Time (ACUTE ONLY) 1607  Mobility Specialist Time Calculation (min) (ACUTE ONLY) 14 min   Mobility Specialist: Progress Note  Post-Mobility:    HR 82, SpO2 98% RA  Pt agreeable to mobility session - received in chair. C/o pain at chest tube site rated 3/10. Returned to bed with all needs met - call bell within reach.   Isla Mari, BS Mobility Specialist Please contact via SecureChat or  Rehab office at [phone].

## 2023-12-08 NOTE — Progress Notes (Signed)
   12/08/23 1345  Mobility  Activity Ambulated with assistance in hallway  Level of Assistance Contact guard assist, steadying assist  Assistive Device Four wheel walker  Distance Ambulated (ft) 225 ft  Activity Response Tolerated fair  Mobility Referral Yes  Mobility visit 1 Mobility  Mobility Specialist Start Time (ACUTE ONLY) 1345  Mobility Specialist Stop Time (ACUTE ONLY) 1418  Mobility Specialist Time Calculation (min) (ACUTE ONLY) 33 min   Mobility Specialist: Progress Note  Pre-Mobility:      HR 77, SpO2 96%RA During Mobility:             SpO2 88-96% RA ( PLB required) Post-Mobility:    HR 84, SpO2 95% RA  Pt agreeable to mobility session - received in bed. C/o BLE weakness and SOB, VSS - RN notified. Returned to chair with all needs met - call bell within reach.   Lisa [name], BS Mobility Specialist Please contact via SecureChat or  Rehab office at [phone].

## 2023-12-08 NOTE — TOC Progression Note (Signed)
 Transition of Care [facility]) - Progression Note    Patient Details  Name: Lisa [name] MRN: [id number] Date of Birth: 28-May-1941  Transition [facility]) CM/SW Contact  Eusebio High, RN Phone Number: 12/08/2023, 9:31 AM  Clinical Narrative:     Chart review- Following for needs. Do not anticipate any additional- Patient already going to [facility] for OP therapy. Has CG 3X a week. Uses [facility] at [facility] No O2. Patient will arrange own [employer] home      Expected Discharge Plan: Home/Self Care Barriers to Discharge: Continued Medical Work up  Expected Discharge Plan and Services   Discharge Planning Services: CM Consult Post Acute Care Choice: Home Health Living arrangements for the past 2 months: Single Family Home                                       Social Determinants of Health (SDOH) Interventions SDOH Screenings   Food Insecurity: No Food Insecurity (12/07/2023)  Housing: Low Risk  (12/07/2023)  Transportation Needs: No Transportation Needs (12/07/2023)  Utilities: Not At Risk (12/07/2023)  Financial Resource Strain: Low Risk  (10/28/2022)   Received from [facility]  Social Connections: Moderately Isolated (12/07/2023)  Tobacco Use: Low Risk  (12/04/2023)    Readmission Risk Interventions    08/10/2023    2:10 PM 07/19/2023    3:29 PM  Readmission Risk Prevention Plan  Transportation Screening Complete Complete  PCP or Specialist Appt within 5-7 Days  Complete  PCP or Specialist Appt within 3-5 Days Complete   Home Care Screening  Complete  Medication Review (RN CM)  Complete  HRI or Home Care Consult Complete   Palliative Care Screening Not Applicable   Medication Review (RN Care Manager) Complete

## 2023-12-09 ENCOUNTER — Inpatient Hospital Stay (HOSPITAL_COMMUNITY)

## 2023-12-09 DIAGNOSIS — I10 Essential (primary) hypertension: Secondary | ICD-10-CM | POA: Diagnosis not present

## 2023-12-09 DIAGNOSIS — I4901 Ventricular fibrillation: Secondary | ICD-10-CM | POA: Diagnosis not present

## 2023-12-09 DIAGNOSIS — I5033 Acute on chronic diastolic (congestive) heart failure: Secondary | ICD-10-CM | POA: Diagnosis not present

## 2023-12-09 DIAGNOSIS — J948 Other specified pleural conditions: Secondary | ICD-10-CM | POA: Diagnosis not present

## 2023-12-09 DIAGNOSIS — J9383 Other pneumothorax: Secondary | ICD-10-CM | POA: Diagnosis not present

## 2023-12-09 LAB — MAGNESIUM: Magnesium: 1.9 mg/dL (ref 1.7–2.4)

## 2023-12-09 LAB — BASIC METABOLIC PANEL WITH GFR
Anion gap: 9 (ref 5–15)
BUN: 20 mg/dL (ref 8–23)
CO2: 27 mmol/L (ref 22–32)
Calcium: 8.6 mg/dL — ABNORMAL LOW (ref 8.9–10.3)
Chloride: 99 mmol/L (ref 98–111)
Creatinine, Ser: 1.61 mg/dL — ABNORMAL HIGH (ref 0.44–1.00)
GFR, Estimated: 32 mL/min — ABNORMAL LOW (ref >60)
Glucose, Bld: 113 mg/dL — ABNORMAL HIGH (ref 70–99)
Potassium: 4.3 mmol/L (ref 3.5–5.1)
Sodium: 135 mmol/L (ref 135–145)

## 2023-12-09 LAB — BODY FLUID CULTURE W GRAM STAIN: Gram Stain: NONE SEEN

## 2023-12-09 LAB — CBC
HCT: 25.7 % — ABNORMAL LOW (ref 36.0–46.0)
Hemoglobin: 8.3 g/dL — ABNORMAL LOW (ref 12.0–15.0)
MCH: 31.3 pg (ref 26.0–34.0)
MCHC: 32.3 g/dL (ref 30.0–36.0)
MCV: 97 fL (ref 80.0–100.0)
Platelets: 195 10*3/uL (ref 150–400)
RBC: 2.65 MIL/uL — ABNORMAL LOW (ref 3.87–5.11)
RDW: 15.5 % (ref 11.5–15.5)
WBC: 6.8 10*3/uL (ref 4.0–10.5)
nRBC: 0 % (ref 0.0–0.2)

## 2023-12-09 LAB — CULTURE, BLOOD (ROUTINE X 2)
Culture: NO GROWTH
Culture: NO GROWTH

## 2023-12-09 MED ORDER — SODIUM CHLORIDE 0.9% FLUSH
10.0000 mL | Freq: Three times a day (TID) | INTRAVENOUS | Status: DC
  Administered 2023-12-09 – 2023-12-16 (×15): 10 mL via INTRAPLEURAL

## 2023-12-09 MED ORDER — STERILE WATER FOR INJECTION IJ SOLN
5.0000 mg | Freq: Once | RESPIRATORY_TRACT | Status: AC
  Administered 2023-12-09: 5 mg via INTRAPLEURAL
  Filled 2023-12-09: qty 5

## 2023-12-09 MED ORDER — MAGNESIUM SULFATE IN D5W 1-5 GM/100ML-% IV SOLN
1.0000 g | Freq: Once | INTRAVENOUS | Status: AC
  Administered 2023-12-09: 1 g via INTRAVENOUS
  Filled 2023-12-09: qty 100

## 2023-12-09 MED ORDER — SODIUM CHLORIDE (PF) 0.9 % IJ SOLN
10.0000 mg | Freq: Once | INTRAMUSCULAR | Status: AC
  Administered 2023-12-09: 10 mg via INTRAPLEURAL
  Filled 2023-12-09: qty 10

## 2023-12-09 NOTE — Progress Notes (Signed)
 Dr. Hilton Lucky notified that patient wraps her legs at home. Per patient it is like a ace wrap but stronger and a compression wrap. She states she uses these wraps and not compression stocking but did not bring them. She is currently using scd's while in bed and elevating legs when in chair. Doctor made aware and no new orders at this time.

## 2023-12-09 NOTE — Progress Notes (Addendum)
   12/09/23 1305  Mobility  Activity Transferred to/from [facility];Transferred from bed to chair  Level of Assistance Contact guard assist, steadying assist  Assistive Device Four wheel walker  Activity Response Tolerated well  Mobility Referral Yes  Mobility visit 1 Mobility  Mobility Specialist Start Time (ACUTE ONLY) 1305  Mobility Specialist Stop Time (ACUTE ONLY) 1335  Mobility Specialist Time Calculation (min) (ACUTE ONLY) 30 min   Mobility Specialist: Progress Note  Pre-Mobility:      HR 81, SpO2 97% RA Post-Mobility:    HR 82, SpO2 96% RA  Pt agreeable to mobility session - received in bed. Pt was asymptomatic throughout session with no complaints. Void complete, pericare completed ind.  Pt required assistance to raise RLE place pillow under BLE's, pt stated she unable to life RLE when legs are up. Returned to chair with all needs met - call bell within reach.   Lisa [name], BS Mobility Specialist Please contact via SecureChat or  Rehab office at [phone].

## 2023-12-09 NOTE — Progress Notes (Signed)
 Patient is alert and oriented x4. Complained of nausea initially at shift start but refused antiemetic. I assessed a pill in a cup at bedside table and asked patient what was it. She stated that is was a senokot from this morning. I asked her why did she not take her pill since she is also complaining of constipation and it helps with that issue. Patient stated that she only took one. I educated patient on making sure she takes the full doctor recommended dosage for effectiveness of medication. I also will inform day team to make sure they watch patient while administering meds to make sure she takes them if she accepts. Patient also refused her cancer medication tonight stating that her oncologist told her to hold off on taking it for a week. Patient did have another episode of nausea in which compazine  was effective. Patient denied additional needs.

## 2023-12-09 NOTE — Progress Notes (Signed)
 NAME:  Lisa [name], MRN:  [id number], DOB:  1940/08/30, LOS: 5 ADMISSION DATE:  12/04/2023, CONSULTATION DATE:  12/04/2023 REFERRING MD:  Dr. Rufina Cough - TRh, CHIEF COMPLAINT:  Pleural effusion    History of Present Illness:  Lisa [name] is a 83 y.o. female with a past medical history significant for CHF, CAD, arrhythmias including V. tach, hypertension,, CML, and melanoma who presented to the ED [facility] at [facility] 4/26 for complaints of progressive right-sided flank and back pain that began 4 to 5 days prior to admission.  Of note patient underwent thoracentesis per interventional radiology 2 days prior to admission for large pleural effusion, 1500 mL were removed.  Appears patient had ex vacuo pneumo post thoracentesis.  I do not see in our system to see that any samples/cytology was obtained during thoracentesis.  She also had an urgent care visit at [facility] on 4/24 for similar complaints of back pain, flank pain and nausea where she was diagnosed with E. coli UTI and started on Keflex .    Patient was admitted per hospitalist with pulmonary critical care consulted for management of hydro pneumothorax On arrival to [facility] patient went into repeated V. tach with firing of her implanted ICD.  She was transferred to ICU, started on amiodarone  infusion  Pertinent  Medical History  CHF, CAD, arrhythmias including V. tach, hypertension,, CML, and melanoma  Significant Hospital Events: Including procedures, antibiotic start and stop dates in addition to other pertinent events   4/26 presented with progressive right sided flank pain with nausea and decreased oral intake.  CT renal study with large left hydropneumothorax.  Developed V. tach arrest and transferred to ICU for amiodarone  drip and cardiology consult 4/27 cardiac condition is stabilized.  Left chest tube placed 4/29 transferred out of ICU 4/30 chest tube placed to suction  Interim History / Subjective:   Chest tube  placed to suction yesterday. Chest x-ray shows improvement in pleural effusion but pneumothorax persists 550 cc output from chest tube  Objective   Blood pressure (!) 128/50, pulse 68, temperature 97.8 F (36.6 C), temperature source Oral, resp. rate 19, height 5\' 1"  (1.549 m), weight 73.6 kg, last menstrual period 02/14/1995, SpO2 95%.        Intake/Output Summary (Last 24 hours) at 12/09/2023 1611 Last data filed at 12/09/2023 0500 Gross per 24 hour  Intake 240 ml  Output 900 ml  Net -660 ml   Filed Weights   12/07/23 0500 12/08/23 0500 12/09/23 0500  Weight: 84.4 kg 77.7 kg 73.6 kg   Examination: Gen:      No acute distress, elderly HEENT:  EOMI, sclera anicteric Neck:     No masses; no thyromegaly Lungs:    Clear to auscultation bilaterally; normal respiratory effort CV:         Regular rate and rhythm; no murmurs Abd:      + bowel sounds; soft, non-tender; no palpable masses, no distension Ext:    No edema; adequate peripheral perfusion Skin:      Warm and dry; no rash Neuro: alert and oriented x 3  Labs/ CXR reviewed   CT renal 12/04/2023- large left hydro pneumothorax, no acute findings in the abdomen or pelvis Chest x-ray 12/04/2023- pneumothorax ex vacuo on the left. CT chest 12/05/23- left-sided hydropneumothorax with trapped lung Chest x-ray 4/30 with persistent left-sided pneumothorax Chest x-ray 5/1 with persistent left hydropneumothorax, decrease in size of pleural effusion  Resolved Hospital Problem list  Assessment & Plan:   Pneumothorax ex vacuo - Pneumothorax ex vacuo following IR thoracentesis 4/24 for left-sided pleural effusion.  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   - suspected trapped lung likely due to inflammation and scarring from prolonged effusion.  - pleural fluid 4/27 exudate by protein on Lights, likely pseudo, transudate by LDH P:  - Chest x-ray is  largely unchanged on suction - Will try pleural lytics to see if we can release the entrapment.  If no change on follow-up chest x-ray tomorrow then lung entrapment is probably unfixable without major surgery.  In that case we can just remove the chest tube when chest tube output has diminished and accept that she will have have a chronic effusion on the left  Remainder per primary team.  Pulmonary will continue to follow for CT management  Best Practice (right click and "Reselect all SmartList Selections" daily)  Per primary team- TRH  Signature:   Iasha Mccalister MD [facility] See Amion for pager  If no response to pager , please call [phone] until 7pm After 7:00 pm call Elink  [phone] 12/09/2023, 4:11 PM

## 2023-12-09 NOTE — Procedures (Signed)
 Pleural Fibrinolytic Administration Procedure Note  Lisa [name]  [id number]  [birth year]  Date:12/09/23  Time:4:38 PM   Provider Performing:Laiylah Roettger   Procedure: Pleural Fibrinolysis Initial day [id number])  Indication(s) Fibrinolysis of complicated pleural effusion  Consent Risks of the procedure as well as the alternatives and risks of each were explained to the patient and/or caregiver.  Consent for the procedure was obtained.   Anesthesia None   Time Out Verified patient identification, verified procedure, site/side was marked, verified correct patient position, special equipment/implants available, medications/allergies/relevant history reviewed, required imaging and test results available.   Sterile Technique Hand hygiene, gloves   Procedure Description Existing pleural catheter was cleaned and accessed in sterile manner.  10mg  of tPA in 30cc of saline and 5mg  of dornase in 30cc of sterile water  were injected into pleural space using existing pleural catheter.  Catheter will be clamped for 1 hour and then placed back to suction.   Complications/Tolerance None; patient tolerated the procedure well.   EBL None   Specimen(s) None  Phyllis Breeze MD [facility] See Amion for pager  If no response to pager , please call [phone] until 7pm After 7:00 pm call Elink  [phone] 12/09/2023, 4:38 PM

## 2023-12-09 NOTE — Progress Notes (Addendum)
 PROGRESS NOTE        PATIENT DETAILS Name: Lisa [name] Age: 83 y.o. Sex: female Date of Birth: May 26, 1941 Admit Date: 12/04/2023 Admitting Physician Johnetta Nab, MD UEA:[id number], Dossie Gayer, DO  Brief Summary: Patient is a 83 y.o.  female with history of prior cardiac arrest, V. tach/V-fib-s/p ICD placement, HTN, chronic mild systolic/diastolic heart failure, CML, hypothyroidism-who had a IR paracentesis on 4/25-presented to the hospital on 4/26 with right flank/back pain-she was subsequently found to have a large left hydropneumothorax-requiring chest tube placement-she was also found to have V. tach with repeated firing of her implanted ICD-she was started on amiodarone  infusion and subsequently transferred to the ICU.  She was stabilized and transferred back to [facility] on 4/28  Significant events: 4/25>> thoracocentesis by IR-1.45 L removed. 4/26>> admit to [facility]-hydropneumothorax 4/26>> V. tach-ICD firing-amiodarone  infusion-transfer to ICU 4/27>> CT chest inserted by CCM 4/28>> transferred to [facility]  Significant studies: 4/26>> CT abdomen/pelvis: Large left hydropneumothorax. 4/27>> CT chest: Large left-sided hydropneumothorax with fluid component greater than 2 L.  Significant microbiology data: 4/26>> culture<10,000 colonies/mL insignificant growth 4/26>> blood culture: No growth 8/27>> pleural fluid culture: No growth  Procedures: 4/27>> CT chest inserted by CCM  Consults: PCCM Cardiology/EP  Subjective: No bowel movement yesterday-claims she had nausea and small episode of vomiting last evening.  No further vomiting.  Passing flatus.  Denies any abdominal pain or distention.  Objective: Vitals: Blood pressure (!) 128/50, pulse 68, temperature 97.8 F (36.6 C), temperature source Oral, resp. rate 19, height 5\' 1"  (1.549 m), weight 73.6 kg, last menstrual period 02/14/1995, SpO2 95%.   Exam: Gen Exam:Alert awake-not in any  distress HEENT:atraumatic, normocephalic Chest: B/L clear to auscultation anteriorly CVS:S1S2 regular Abdomen:soft non tender, non distended Extremities: Chronic lymphedema/chronic venous stasis changes. Neurology: Non focal Skin: no rash  Pertinent Labs/Radiology:    Latest Ref Rng & Units 12/09/2023    4:28 AM 12/08/2023    4:24 AM 12/07/2023    4:24 AM  CBC  WBC 4.0 - 10.5 K/uL 6.8  5.6  5.3   Hemoglobin 12.0 - 15.0 g/dL 8.3  8.1  8.3   Hematocrit 36.0 - 46.0 % 25.7  24.9  25.7   Platelets 150 - 400 K/uL 195  172  176     Lab Results  Component Value Date   NA 136 12/08/2023   K 3.5 12/08/2023   CL 102 12/08/2023   CO2 25 12/08/2023     Assessment/Plan: Recurrent ventricular tachycardia ICD in place No further V. tach Paced rhythm Attempt to keep keep K> 4, Mg> 2-will K accordingly today.  No longer on IV amiodarone -has been switched to oral amiodarone .  Patient aware of driving restriction  Pneumothorax ex vacuo S/p left-sided chest tube placement Suspected trapped lung likely due to inflammation/scarring from prolonged effusion. CCM following-chest tube placed back to suction yesterday Await further recommendations from PCCM.  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 Cytology negative this admit-prior cytologies x 3 negative as well.   Chronic combined systolic/diastolic heart failure (EF 40-45% with grade 1 diastolic dysfunction on 4/27) Euvolemic on exam-but does have chronic lymphedema at baseline. Continue oral diuretics.  HLD Statin  Constipation No BM in spite of MiraLAX /senna-trying Dulcolax suppository today Thankfully passing flatus-if no improvement-will need imaging.   UTI Afebrile/no leukocytosis  Rocephin  x 3 doses completed on 4/29  CML Gleevec  Follow CBC periodically  Hypothyroidism Armour Thyroid   CKD stage IIIb Creatinine close to baseline AKI ruled  out.  Chronic venous stasis changes in bilateral lower extremities/lymphedema Diuretics  Debility/deconditioning PT/OT eval appreciated Spoke with daughter on 4/30-previously has refused SNF-has refused to work with PT/OT in the house.  Apparently lives in unhygienic conditions-hoards goods.  requested case manager on 4/30 to discuss discharge options with daughter.  Class 1 Obesity: Estimated body mass index is 30.66 kg/m as calculated from the following:   Height as of this encounter: 5\' 1"  (1.549 m).   Weight as of this encounter: 73.6 kg.   Code status:   Code Status: Full Code   DVT Prophylaxis: heparin  injection 5,000 Units Start: 12/04/23 2200   Family Communication: Daughter-Shannon-[phone] updated over the phone on 4/30.   Disposition Plan: Status is: Inpatient Remains inpatient appropriate because: Severity of illness   Planned Discharge Destination:Home   Diet: Diet Order             Diet Heart Room service appropriate? Yes; Fluid consistency: Thin; Fluid restriction: 1800 mL Fluid  Diet effective now                     Antimicrobial agents: Anti-infectives (From admission, onward)    Start     Dose/Rate Route Frequency Ordered Stop   12/05/23 1400  cefTRIAXone  (ROCEPHIN ) 2 g in sodium chloride  0.9 % 100 mL IVPB        2 g 200 mL/hr over 30 Minutes Intravenous Every 24 hours 12/05/23 1312 12/07/23 1615   12/04/23 1732  cephALEXin  (KEFLEX ) capsule 500 mg  Status:  Discontinued        500 mg Oral Every 12 hours 12/04/23 1741 12/05/23 1312        MEDICATIONS: Scheduled Meds:  amiodarone   400 mg Oral BID   Followed by   Cecily Cohen ON 12/11/2023] amiodarone   200 mg Oral BID   atorvastatin   20 mg Oral Daily   Chlorhexidine  Gluconate Cloth  6 each Topical Daily   heparin   5,000 Units Subcutaneous Q8H   imatinib   400 mg Oral QHS   polyethylene glycol  17 g Oral BID   potassium chloride   40 mEq Oral Daily   senna  2 tablet Oral Daily   thyroid    15 mg Oral QAC breakfast   torsemide   40 mg Oral Daily   Continuous Infusions:   PRN Meds:.acetaminophen  **OR** acetaminophen , alum & mag hydroxide-simeth, bisacodyl , morphine  injection, nitroGLYCERIN , mouth rinse, prochlorperazine    I have personally reviewed following labs and imaging studies  LABORATORY DATA: CBC: Recent Labs  Lab 12/04/23 1144 12/04/23 1821 12/05/23 0247 12/06/23 0323 12/07/23 0424 12/08/23 0424 12/09/23 0428  WBC 4.7   < > 5.0 5.4 5.3 5.6 6.8  NEUTROABS 3.6  --   --   --   --   --   --   HGB 8.9*   < > 8.2* 7.9* 8.3* 8.1* 8.3*  HCT 27.1*   < > 25.5* 25.3* 25.7* 24.9* 25.7*  MCV 96.8   < > 97.3 99.2 97.0 97.6 97.0  PLT 173   < > 169 165 176 172 195   < > = values in this interval not displayed.    Basic Metabolic Panel: Recent Labs  Lab 12/05/23 0247 12/05/23 1721 12/06/23 0323 12/07/23 0424 12/08/23 0424 12/09/23 0428  NA 135 136 136 136 136  --   K  4.6 4.5 4.3 3.5 3.5  --   CL 104 102 103 102 102  --   CO2 22 22 23 24 25   --   GLUCOSE 109* 128* 122* 109* 116*  --   BUN 16 17 15 16 17   --   CREATININE 1.49* 1.70* 1.81* 1.66* 1.55*  --   CALCIUM  8.2* 8.3* 8.1* 8.1* 7.9*  --   MG 2.3  --  2.2 1.9 2.0 1.9    GFR: Estimated Creatinine Clearance: 26.1 mL/min (A) (by C-G formula based on SCr of 1.55 mg/dL (H)).  Liver Function Tests: Recent Labs  Lab 12/04/23 1144 12/04/23 1903 12/05/23 0247  AST 29 31 25   ALT 24 23 21   ALKPHOS 78 58 46  BILITOT 0.9 1.2 0.7  PROT 6.3* 6.4* 5.3*  ALBUMIN  3.8 3.4* 2.8*   No results for input(s): "LIPASE", "AMYLASE" in the last 168 hours. No results for input(s): "AMMONIA" in the last 168 hours.  Coagulation Profile: No results for input(s): "INR", "PROTIME" in the last 168 hours.  Cardiac Enzymes: No results for input(s): "CKTOTAL", "CKMB", "CKMBINDEX", "TROPONINI" in the last 168 hours.  BNP (last 3 results) Recent Labs    12/04/23 1144  PROBNP 4,071.0*    Lipid Profile: No results for  input(s): "CHOL", "HDL", "LDLCALC", "TRIG", "CHOLHDL", "LDLDIRECT" in the last 72 hours.  Thyroid  Function Tests: No results for input(s): "TSH", "T4TOTAL", "FREET4", "T3FREE", "THYROIDAB" in the last 72 hours.   Anemia Panel: No results for input(s): "VITAMINB12", "FOLATE", "FERRITIN", "TIBC", "IRON", "RETICCTPCT" in the last 72 hours.  Urine analysis:    Component Value Date/Time   COLORURINE YELLOW 12/04/2023 1202   APPEARANCEUR CLEAR 12/04/2023 1202   LABSPEC 1.010 12/04/2023 1202   PHURINE 8.0 12/04/2023 1202   GLUCOSEU NEGATIVE 12/04/2023 1202   HGBUR NEGATIVE 12/04/2023 1202   BILIRUBINUR NEGATIVE 12/04/2023 1202   BILIRUBINUR negative 06/04/2016 0847   BILIRUBINUR neg 02/14/2015 1431   KETONESUR 15 (A) 12/04/2023 1202   PROTEINUR TRACE (A) 12/04/2023 1202   UROBILINOGEN 0.2 06/04/2016 0847   NITRITE NEGATIVE 12/04/2023 1202   LEUKOCYTESUR NEGATIVE 12/04/2023 1202    Sepsis Labs: Lactic Acid, Venous    Component Value Date/Time   LATICACIDVEN 1.4 08/08/2023 2150    MICROBIOLOGY: Recent Results (from the past 240 hours)  Urine Culture     Status: Abnormal   Collection Time: 12/04/23 12:02 PM   Specimen: Urine, Clean Catch  Result Value Ref Range Status   Specimen Description   Final    URINE, CLEAN CATCH Performed at [facility], [address] [place], [place] [address]    Special Requests   Final    NONE Performed at [facility], [address], [place], [place] [address]    Culture (A)  Final    <10,000 COLONIES/mL INSIGNIFICANT GROWTH Performed at [facility], [address] [place], [place] [address]    Report Status 12/05/2023 FINAL  Final  MRSA Next Gen by PCR, Nasal     Status: None   Collection Time: 12/04/23  6:52 PM   Specimen: Nasal Mucosa; Nasal Swab  Result Value Ref Range Status   MRSA by PCR Next Gen NOT DETECTED NOT DETECTED Final    Comment: (NOTE) The GeneXpert MRSA Assay (FDA  approved for NASAL specimens only), is one component of a comprehensive MRSA colonization surveillance program. It is not intended to diagnose MRSA infection nor to guide or monitor treatment for MRSA infections. Test performance is not  FDA approved in patients less than 44 years old. Performed at [facility], [address] [place], [place] [address]   Culture, blood (Routine X 2) w Reflex to ID Panel     Status: None   Collection Time: 12/04/23  7:03 PM   Specimen: BLOOD  Result Value Ref Range Status   Specimen Description BLOOD SITE NOT SPECIFIED  Final   Special Requests   Final    BOTTLES DRAWN AEROBIC ONLY Blood Culture results may not be optimal due to an inadequate volume of blood received in culture bottles   Culture   Final    NO GROWTH 5 DAYS Performed at [facility], [address] [place], [place] [address]    Report Status 12/09/2023 FINAL  Final  Culture, blood (Routine X 2) w Reflex to ID Panel     Status: None   Collection Time: 12/04/23  7:11 PM   Specimen: BLOOD  Result Value Ref Range Status   Specimen Description BLOOD SITE NOT SPECIFIED  Final   Special Requests   Final    BOTTLES DRAWN AEROBIC ONLY Blood Culture results may not be optimal due to an inadequate volume of blood received in culture bottles   Culture   Final    NO GROWTH 5 DAYS Performed at [facility], [address] [place], [place] [address]    Report Status 12/09/2023 FINAL  Final  Body fluid culture w Gram Stain     Status: None (Preliminary result)   Collection Time: 12/05/23  2:59 PM   Specimen: Pleural Fluid  Result Value Ref Range Status   Specimen Description PLEURAL  Final   Special Requests NONE  Final   Gram Stain NO WBC SEEN NO ORGANISMS SEEN   Final   Culture   Final    NO GROWTH 3 DAYS Performed at [facility], [address], [place], [place] [address]    Report Status PENDING  Incomplete    RADIOLOGY STUDIES/RESULTS: DG CHEST PORT 1  VIEW Result Date: 12/09/2023 CLINICAL DATA:  Follow-up pneumothorax EXAM: PORTABLE CHEST 1 VIEW COMPARISON:  Film from the previous day. FINDINGS: Pigtail catheter is again noted on the left. Reduction in left-sided pleural effusion is noted. Left-sided pneumothorax is noted with slight increase in basilar component given the interval decrease in pleural effusion. Left basilar atelectasis is noted. Right lung is clear. Defibrillator is again noted. IMPRESSION: Decrease in left-sided pleural effusion with pneumothorax on the left. Electronically Signed   By: Violeta Grey M.D.   On: 12/09/2023 09:31   DG Chest Port 1V same Day Result Date: 12/08/2023 CLINICAL DATA:  [id number] Pneumohemothorax [id number] for EXAM: PORTABLE CHEST 1 VIEW COMPARISON:  None available. FINDINGS: Left chest pacemaker/AICD with leads terminating in the right atrium, right ventricle, and coronary sinus. Lower lung volumes. Bronchovascular crowding. Moderate-sized left apical pneumothorax measuring 4 cm (previously 3.7 cm). No cardiomegaly. Left-sided small bore thoracostomy tube similarly positioned in the medial left lung apex. Small amount of left pleural fluid. No acute fracture or destructive lesion. Multilevel thoracic osteophytosis. IMPRESSION: Similar size of the left hydropneumothorax measuring 4 cm at the lung apex (previously, 3.8 cm). Small bore left-sided thoracostomy tube is also similarly positioned in the medial left lung apex. Electronically Signed   By: Rance Burrows M.D.   On: 12/08/2023 09:13   DG Chest Port 1 View Result Date: 12/07/2023 CLINICAL DATA:  Pleural effusion EXAM: PORTABLE CHEST 1 VIEW COMPARISON:  12/07/2023 FINDINGS: Single frontal view of the chest demonstrates stable pigtail drainage catheter overlying the medial left lung base. Stable left hydropneumothorax. No acute airspace disease. The cardiac silhouette is stable. Multi lead pacer/AICD unchanged. IMPRESSION: 1. Stable left hydropneumothorax. Left  pigtail drainage catheter unchanged in position. Electronically Signed   By: Bobbye Burrow M.D.   On: 12/07/2023 18:52     LOS: 5 days   Kimberly Penna, MD  [facility]    To contact the attending provider between 7A-7P or the covering provider during after hours 7P-7A, please log into the web site [email or web address] and access using universal [place] password for that web site. If you do not have the password, please call the hospital operator.  12/09/2023, 9:34 AM

## 2023-12-09 NOTE — Progress Notes (Signed)
 1639: Dr. Waylan Haggard, MD clamped chest tube and injected medications. Patient tolerated lying 20 minutes on back, 20 minutes on left side, and 20 minutes on right side.   1745: Chest tube unclamped and hooked to -20 cm suction. Canister marked for day shift output. .

## 2023-12-09 NOTE — Plan of Care (Signed)

## 2023-12-09 NOTE — Progress Notes (Signed)
 Physical Therapy Treatment Patient Details Name: Lisa [name] MRN: [id number] DOB: 1940/10/26 Today's Date: 12/09/2023   History of Present Illness Pt is an 83 year old woman admitted 12/04/23  with R sided flank pain and nausea. CT renal study showed large L hydropneumothorax. Chest tube placed 4/27. Pt with V tach and ICD firing during admission requiring brief ICU stay for amiodarone  infusion. Of note, pt had thoracentesis 2 days prior.  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    PT Comments  Pt is progressing towards goals. Currently pt is CGA to Min A for sit to stand from varying surfaces with rollator, supervision for gait and Min A for LE to get into bed. Due to pt current functional status, home set up and available assistance at home recommending skilled physical therapy services 3x/week in order to address strength, balance and functional mobility to decrease risk for falls, injury and re-hospitalization.       If plan is discharge home, recommend the following: Assistance with cooking/housework;Help with stairs or ramp for entrance     Equipment Recommendations  None recommended by PT       Precautions / Restrictions Precautions Precautions: Fall;Other (comment) Recall of Precautions/Restrictions: Intact Precaution/Restrictions Comments: chest tube Restrictions Weight Bearing Restrictions Per Provider Order: No     Mobility  Bed Mobility Overal bed mobility: Needs Assistance Bed Mobility: Sit to Supine       Sit to supine: Min assist   General bed mobility comments: min A to assist getting LE into bed.    Transfers Overall transfer level: Needs assistance Equipment used: Rollator (4 wheels) Transfers: Bed to chair/wheelchair/BSC, Sit to/from Stand Sit to Stand: Contact guard assist, Min assist   Step pivot transfers: Contact guard assist       General transfer comment: CGA for sit to  stand from recliner and Min A from toilet. Pt was assisted to toilet stating she needed to use the bathroom.    Ambulation/Gait Ambulation/Gait assistance: Supervision Gait Distance (Feet): 120 Feet (2x) Assistive device: Rollator (4 wheels) Gait Pattern/deviations: Step-through pattern, Decreased stride length Gait velocity: decreased Gait velocity interpretation: 1.31 - 2.62 ft/sec, indicative of limited community ambulator   General Gait Details: pt ambulated 120 ft 2x with seated rest break on rollator for ~ 2 min      Balance Overall balance assessment: Needs assistance Sitting-balance support: No upper extremity supported, Feet supported Sitting balance-Leahy Scale: Fair     Standing balance support: Bilateral upper extremity supported, During functional activity, Single extremity supported, No upper extremity supported, Reliant on assistive device for balance Standing balance-Leahy Scale: Fair Standing balance comment: no overt LOB        Communication Communication Communication: No apparent difficulties  Cognition Arousal: Alert Behavior During Therapy: WFL for tasks assessed/performed   PT - Cognitive impairments: No apparent impairments   Following commands: Intact      Cueing Cueing Techniques: Verbal cues, Tactile cues     General Comments General comments (skin integrity, edema, etc.): Pt was slightly short of breathe after gait but recovered quickly. O2 sats 95%      Pertinent Vitals/Pain Pain Assessment Pain Assessment: No/denies pain     PT Goals (current goals can now be found in the care plan section) Acute Rehab PT Goals Patient Stated Goal: return home, address back pain PT Goal Formulation: With patient Time For Goal Achievement: 12/21/23 Potential to Achieve Goals: Good Progress  towards PT goals: Progressing toward goals    Frequency    Min 2X/week      PT Plan  Continue with current POC        AM-PAC PT "6 Clicks" Mobility    Outcome Measure  Help needed turning from your back to your side while in a flat bed without using bedrails?: A Little Help needed moving from lying on your back to sitting on the side of a flat bed without using bedrails?: A Little Help needed moving to and from a bed to a chair (including a wheelchair)?: A Little Help needed standing up from a chair using your arms (e.g., wheelchair or bedside chair)?: A Little Help needed to walk in hospital room?: A Little Help needed climbing 3-5 steps with a railing? : A Little 6 Click Score: 18    End of Session Equipment Utilized During Treatment: Gait belt Activity Tolerance: Patient tolerated treatment well Patient left: in bed;with call bell/phone within reach;with bed alarm set Nurse Communication: Mobility status PT Visit Diagnosis: Pain;Difficulty in walking, not elsewhere classified (R26.2)     Time: 1308-6578 PT Time Calculation (min) (ACUTE ONLY): 30 min  Charges:    $Therapeutic Activity: 23-37 mins PT General Charges $$ ACUTE PT VISIT: 1 Visit                    Sloan Duncans, DPT, CLT  Acute Rehabilitation Services Office: [phone] (Secure chat preferred)    Jenice Mitts 12/09/2023, 4:14 PM

## 2023-12-10 ENCOUNTER — Inpatient Hospital Stay (HOSPITAL_COMMUNITY)

## 2023-12-10 DIAGNOSIS — I5033 Acute on chronic diastolic (congestive) heart failure: Secondary | ICD-10-CM | POA: Diagnosis not present

## 2023-12-10 DIAGNOSIS — J948 Other specified pleural conditions: Secondary | ICD-10-CM | POA: Diagnosis not present

## 2023-12-10 DIAGNOSIS — I4901 Ventricular fibrillation: Secondary | ICD-10-CM | POA: Diagnosis not present

## 2023-12-10 DIAGNOSIS — I10 Essential (primary) hypertension: Secondary | ICD-10-CM | POA: Diagnosis not present

## 2023-12-10 LAB — BASIC METABOLIC PANEL WITH GFR
Anion gap: 10 (ref 5–15)
BUN: 21 mg/dL (ref 8–23)
CO2: 27 mmol/L (ref 22–32)
Calcium: 8.6 mg/dL — ABNORMAL LOW (ref 8.9–10.3)
Chloride: 98 mmol/L (ref 98–111)
Creatinine, Ser: 1.8 mg/dL — ABNORMAL HIGH (ref 0.44–1.00)
GFR, Estimated: 28 mL/min — ABNORMAL LOW (ref >60)
Glucose, Bld: 119 mg/dL — ABNORMAL HIGH (ref 70–99)
Potassium: 4.4 mmol/L (ref 3.5–5.1)
Sodium: 135 mmol/L (ref 135–145)

## 2023-12-10 LAB — CBC
HCT: 27 % — ABNORMAL LOW (ref 36.0–46.0)
Hemoglobin: 8.8 g/dL — ABNORMAL LOW (ref 12.0–15.0)
MCH: 31.5 pg (ref 26.0–34.0)
MCHC: 32.6 g/dL (ref 30.0–36.0)
MCV: 96.8 fL (ref 80.0–100.0)
Platelets: 211 10*3/uL (ref 150–400)
RBC: 2.79 MIL/uL — ABNORMAL LOW (ref 3.87–5.11)
RDW: 15.4 % (ref 11.5–15.5)
WBC: 6.4 10*3/uL (ref 4.0–10.5)
nRBC: 0 % (ref 0.0–0.2)

## 2023-12-10 LAB — MAGNESIUM: Magnesium: 2 mg/dL (ref 1.7–2.4)

## 2023-12-10 MED ORDER — ENOXAPARIN SODIUM 30 MG/0.3ML IJ SOSY
30.0000 mg | PREFILLED_SYRINGE | INTRAMUSCULAR | Status: DC
  Administered 2023-12-10 – 2023-12-16 (×7): 30 mg via SUBCUTANEOUS
  Filled 2023-12-10 (×7): qty 0.3

## 2023-12-10 MED ORDER — BISACODYL 10 MG RE SUPP
10.0000 mg | Freq: Once | RECTAL | Status: AC
  Administered 2023-12-10: 10 mg via RECTAL
  Filled 2023-12-10: qty 1

## 2023-12-10 MED ORDER — ONDANSETRON 4 MG PO TBDP: 4.0000 mg | ORAL_TABLET | Freq: Three times a day (TID) | ORAL | Status: DC | PRN

## 2023-12-10 NOTE — Progress Notes (Signed)
 Occupational Therapy Treatment Patient Details Name: Lisa [name] MRN: [id number] DOB: [birth year] Today's Date: 12/10/2023   History of present illness Pt is an 83 year old woman admitted 12/04/23  with R sided flank pain and nausea. CT renal study showed large L hydropneumothorax. Chest tube placed 4/27. Pt with V tach and ICD firing during admission requiring brief ICU stay for amiodarone  infusion. Of note, pt had thoracentesis 2 days prior.  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   OT comments  Pt met her pt focused goal, was able to ambulate to the bathroom with CGA + rollator, no overt LOB. Reinforced safety with use of rollator and pursed lip breathing education prn. OT to continue to progress pt as able. DC plans remain appropriate for no OT follow-up.       If plan is discharge home, recommend the following:  Assistance with cooking/housework;Assist for transportation;A little help with bathing/dressing/bathroom   Equipment Recommendations  None recommended by OT    Recommendations for Other Services      Precautions / Restrictions Precautions Precautions: Fall;Other (comment) Recall of Precautions/Restrictions: Intact Precaution/Restrictions Comments: chest tube Restrictions Weight Bearing Restrictions Per Provider Order: No       Mobility Bed Mobility Overal bed mobility: Needs Assistance Bed Mobility: Sit to Supine, Supine to Sit     Supine to sit: Contact guard Sit to supine: Min assist   General bed mobility comments: min A to assist getting LE into bed.    Transfers Overall transfer level: Needs assistance Equipment used: Rollator (4 wheels) Transfers: Sit to/from Stand, Bed to chair/wheelchair/BSC Sit to Stand: Contact guard assist     Step pivot transfers: Contact guard assist     General transfer comment: STSx3 with CGA, educated pt on the importance of bracing RW against  supported surface prior to seated rest breaks. Also emphasized the importance of acheiving full stand then turning body as pt has tendency to perform a fluid movement of standing and turning which causes rollator to turn despite brakes being locked.     Balance Overall balance assessment: Needs assistance Sitting-balance support: No upper extremity supported, Feet supported Sitting balance-Leahy Scale: Fair     Standing balance support: Bilateral upper extremity supported, During functional activity, Single extremity supported, No upper extremity supported, Reliant on assistive device for balance Standing balance-Leahy Scale: Fair Standing balance comment: no overt LOB                           ADL either performed or assessed with clinical judgement   ADL                           Toilet Transfer: Contact guard assist;Rolling walker (2 wheels);Ambulation   Toileting- Clothing Manipulation and Hygiene: Contact guard assist;Sitting/lateral lean;Sit to/from stand         General ADL Comments: Reinforced pursed lip breathing prn    Extremity/Trunk Assessment              Vision       Perception     Praxis     Communication Communication Communication: No apparent difficulties   Cognition Arousal: Alert Behavior During Therapy: WFL for tasks assessed/performed Cognition: No apparent impairments  Following commands: Intact        Cueing   Cueing Techniques: Verbal cues, Tactile cues  Exercises      Shoulder Instructions       General Comments HR up to 125 bpm with activity, 2 seated rest breaks prn, 02>91% with mobility although pleth looking inaccurate so likely more. RR up to 33, emphasized pursed lip breathing prn.    Pertinent Vitals/ Pain       Pain Assessment Pain Assessment: Faces Faces Pain Scale: Hurts a little bit Pain Location: chest tube insertion site when couging Pain  Descriptors / Indicators: Sore Pain Intervention(s): Monitored during session, Limited activity within patient's tolerance  Home Living                                          Prior Functioning/Environment              Frequency  Min 2X/week        Progress Toward Goals  OT Goals(current goals can now be found in the care plan section)  Progress towards OT goals: Progressing toward goals  Acute Rehab OT Goals Patient Stated Goal: to get good enough to go home OT Goal Formulation: With patient Time For Goal Achievement: 12/21/23 Potential to Achieve Goals: Good  Plan      Co-evaluation                 AM-PAC OT "6 Clicks" Daily Activity     Outcome Measure   Help from another person eating meals?: None Help from another person taking care of personal grooming?: A Little Help from another person toileting, which includes using toliet, bedpan, or urinal?: A Little Help from another person bathing (including washing, rinsing, drying)?: A Little Help from another person to put on and taking off regular upper body clothing?: A Little Help from another person to put on and taking off regular lower body clothing?: A Little 6 Click Score: 19    End of Session Equipment Utilized During Treatment: Rollator (4 wheels);Gait belt  OT Visit Diagnosis: Unsteadiness on feet (R26.81);Other abnormalities of gait and mobility (R26.89);Muscle weakness (generalized) (M62.81)   Activity Tolerance Patient tolerated treatment well   Patient Left in bed;with call bell/phone within reach;with bed alarm set;with family/visitor present   Nurse Communication Mobility status;Other (comment)        Time: 1610-9604 OT Time Calculation (min): 38 min  Charges: OT General Charges $OT Visit: 1 Visit OT Treatments $Self Care/Home Management : 8-22 mins $Therapeutic Activity: 23-37 mins  12/10/2023  AB, OTR/L  Acute Rehabilitation Services  Office:  [phone]   Jorene New 12/10/2023, 4:41 PM

## 2023-12-10 NOTE — Progress Notes (Signed)
 PROGRESS NOTE        PATIENT DETAILS Name: Lisa [name] Age: 83 y.o. Sex: female Date of Birth: [birth year] Admit Date: 12/04/2023 Admitting Physician Johnetta Nab, MD [id number], Dossie Gayer, DO  Brief Summary: Patient is a 83 y.o.  female with history of prior cardiac arrest, V. tach/V-fib-s/p ICD placement, HTN, chronic mild systolic/diastolic heart failure, CML, hypothyroidism-who had a IR paracentesis on 4/25-presented to the hospital on 4/26 with right flank/back pain-she was subsequently found to have a large left hydropneumothorax-requiring chest tube placement-she was also found to have V. tach with repeated firing of her implanted ICD-she was started on amiodarone  infusion and subsequently transferred to the ICU.  She was stabilized and transferred back to [facility] on 4/28  Significant events: 4/25>> thoracocentesis by IR-1.45 L removed. 4/26>> admit to [facility]-hydropneumothorax 4/26>> V. tach-ICD firing-amiodarone  infusion-transfer to ICU 4/27>> CT chest inserted by CCM 4/28>> transferred to [facility]  Significant studies: 4/26>> CT abdomen/pelvis: Large left hydropneumothorax. 4/27>> CT chest: Large left-sided hydropneumothorax with fluid component greater than 2 L.  Significant microbiology data: 4/26>> culture<10,000 colonies/mL insignificant growth 4/26>> blood culture: No growth 8/27>> pleural fluid culture: No growth  Procedures: 4/27>> CT chest inserted by CCM  Consults: PCCM Cardiology/EP  Subjective: No major issues-has not had a BM for the past several days-no vomiting.  Passing flatus.  Objective: Vitals: Blood pressure (!) 149/64, pulse 81, temperature 100 F (37.8 C), resp. rate (!) 26, height 5\' 1"  (1.549 m), weight 73.6 kg, last menstrual period 02/14/1995, SpO2 93%.   Exam: Gen Exam:Alert awake-not in any distress HEENT:atraumatic, normocephalic Chest: B/L clear to auscultation anteriorly CVS:S1S2  regular Abdomen:soft non tender, non distended Extremities: Chronic lymphedema/chronic venous stasis changes. Neurology: Non focal Skin: no rash  Pertinent Labs/Radiology:    Latest Ref Rng & Units 12/10/2023    5:25 AM 12/09/2023    4:28 AM 12/08/2023    4:24 AM  CBC  WBC 4.0 - 10.5 K/uL 6.4  6.8  5.6   Hemoglobin 12.0 - 15.0 g/dL 8.8  8.3  8.1   Hematocrit 36.0 - 46.0 % 27.0  25.7  24.9   Platelets 150 - 400 K/uL 211  195  172     Lab Results  Component Value Date   NA 135 12/10/2023   K 4.4 12/10/2023   CL 98 12/10/2023   CO2 27 12/10/2023     Assessment/Plan: Recurrent ventricular tachycardia ICD in place No further V. tach Paced rhythm Attempt to keep keep K> 4, Mg> 2-will K accordingly today.  No longer on IV amiodarone -has been switched to oral amiodarone .  Patient aware of driving restriction  Pneumothorax ex vacuo S/p left-sided chest tube placement Suspected trapped lung likely due to inflammation/scarring from prolonged effusion. Chest tube placed back to suction on 4/30-started lytic therapy on 5/1. Await further recommendations from PCCM.   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 Cytology negative this admit-prior cytologies x 3 negative as well.   Chronic combined systolic/diastolic heart failure (EF 40-45% with grade 1 diastolic dysfunction on 4/27) Euvolemic on exam-but does have chronic lymphedema at baseline. Continue oral diuretics.  HLD Statin  Constipation No BM in spite of MiraLAX /senna-did not get the Dulcolax suppository yesterday Spoke with RN-tapwater enema today.  No bowel obstruction-passing gas-no vomiting.  UTI Afebrile/no leukocytosis Rocephin  x 3  doses completed on 4/29  CML Gleevec  Follow CBC periodically  Hypothyroidism Armour Thyroid   CKD stage IIIb Creatinine close to baseline AKI ruled out.  Chronic venous stasis changes in bilateral lower  extremities/lymphedema Diuretics Unna boots ordered 5/2.  Debility/deconditioning PT/OT eval appreciated-Home health planned on discharge. Spoke with daughter on 4/30-previously has refused SNF-has refused to work with PT/OT in the house.  Apparently lives in unhygienic conditions-hoards goods.  Appreciate case manager talking to family on 4/30-note reviewed.  Class 1 Obesity: Estimated body mass index is 30.66 kg/m as calculated from the following:   Height as of this encounter: 5\' 1"  (1.549 m).   Weight as of this encounter: 73.6 kg.   Code status:   Code Status: Full Code   DVT Prophylaxis: enoxaparin  (LOVENOX ) injection 30 mg Start: 12/10/23 1000   Family Communication: Daughter-Shannon-[phone] updated over the phone on 4/30.   Disposition Plan: Status is: Inpatient Remains inpatient appropriate because: Severity of illness   Planned Discharge Destination:Home   Diet: Diet Order             Diet Heart Room service appropriate? Yes; Fluid consistency: Thin; Fluid restriction: 1800 mL Fluid  Diet effective now                     Antimicrobial agents: Anti-infectives (From admission, onward)    Start     Dose/Rate Route Frequency Ordered Stop   12/05/23 1400  cefTRIAXone  (ROCEPHIN ) 2 g in sodium chloride  0.9 % 100 mL IVPB        2 g 200 mL/hr over 30 Minutes Intravenous Every 24 hours 12/05/23 1312 12/07/23 1615   12/04/23 1732  cephALEXin  (KEFLEX ) capsule 500 mg  Status:  Discontinued        500 mg Oral Every 12 hours 12/04/23 1741 12/05/23 1312        MEDICATIONS: Scheduled Meds:  amiodarone   400 mg Oral BID   Followed by   Cecily Cohen ON 12/11/2023] amiodarone   200 mg Oral BID   atorvastatin   20 mg Oral Daily   Chlorhexidine  Gluconate Cloth  6 each Topical Daily   enoxaparin  (LOVENOX ) injection  30 mg Subcutaneous Q24H   imatinib   400 mg Oral QHS   polyethylene glycol  17 g Oral BID   potassium chloride   40 mEq Oral Daily   senna  2 tablet Oral  Daily   sodium chloride  flush  10 mL Intrapleural Q8H   thyroid   15 mg Oral QAC breakfast   torsemide   40 mg Oral Daily   Continuous Infusions:   PRN Meds:.acetaminophen  **OR** acetaminophen , alum & mag hydroxide-simeth, bisacodyl , morphine  injection, nitroGLYCERIN , mouth rinse, prochlorperazine    I have personally reviewed following labs and imaging studies  LABORATORY DATA: CBC: Recent Labs  Lab 12/04/23 1144 12/04/23 1821 12/06/23 0323 12/07/23 0424 12/08/23 0424 12/09/23 0428 12/10/23 0525  WBC 4.7   < > 5.4 5.3 5.6 6.8 6.4  NEUTROABS 3.6  --   --   --   --   --   --   HGB 8.9*   < > 7.9* 8.3* 8.1* 8.3* 8.8*  HCT 27.1*   < > 25.3* 25.7* 24.9* 25.7* 27.0*  MCV 96.8   < > 99.2 97.0 97.6 97.0 96.8  PLT 173   < > 165 176 172 195 211   < > = values in this interval not displayed.    Basic Metabolic Panel: Recent Labs  Lab 12/06/23 0323 12/07/23 0424 12/08/23 0424 12/09/23 4098  12/09/23 0931 12/10/23 0525  NA 136 136 136  --  135 135  K 4.3 3.5 3.5  --  4.3 4.4  CL 103 102 102  --  99 98  CO2 23 24 25   --  27 27  GLUCOSE 122* 109* 116*  --  113* 119*  BUN 15 16 17   --  20 21  CREATININE 1.81* 1.66* 1.55*  --  1.61* 1.80*  CALCIUM  8.1* 8.1* 7.9*  --  8.6* 8.6*  MG 2.2 1.9 2.0 1.9  --  2.0    GFR: Estimated Creatinine Clearance: 22.5 mL/min (A) (by C-G formula based on SCr of 1.8 mg/dL (H)).  Liver Function Tests: Recent Labs  Lab 12/04/23 1144 12/04/23 1903 12/05/23 0247  AST 29 31 25   ALT 24 23 21   ALKPHOS 78 58 46  BILITOT 0.9 1.2 0.7  PROT 6.3* 6.4* 5.3*  ALBUMIN  3.8 3.4* 2.8*   No results for input(s): "LIPASE", "AMYLASE" in the last 168 hours. No results for input(s): "AMMONIA" in the last 168 hours.  Coagulation Profile: No results for input(s): "INR", "PROTIME" in the last 168 hours.  Cardiac Enzymes: No results for input(s): "CKTOTAL", "CKMB", "CKMBINDEX", "TROPONINI" in the last 168 hours.  BNP (last 3 results) Recent Labs     12/04/23 1144  PROBNP 4,071.0*    Lipid Profile: No results for input(s): "CHOL", "HDL", "LDLCALC", "TRIG", "CHOLHDL", "LDLDIRECT" in the last 72 hours.  Thyroid  Function Tests: No results for input(s): "TSH", "T4TOTAL", "FREET4", "T3FREE", "THYROIDAB" in the last 72 hours.   Anemia Panel: No results for input(s): "VITAMINB12", "FOLATE", "FERRITIN", "TIBC", "IRON", "RETICCTPCT" in the last 72 hours.  Urine analysis:    Component Value Date/Time   COLORURINE YELLOW 12/04/2023 1202   APPEARANCEUR CLEAR 12/04/2023 1202   LABSPEC 1.010 12/04/2023 1202   PHURINE 8.0 12/04/2023 1202   GLUCOSEU NEGATIVE 12/04/2023 1202   HGBUR NEGATIVE 12/04/2023 1202   BILIRUBINUR NEGATIVE 12/04/2023 1202   BILIRUBINUR negative 06/04/2016 0847   BILIRUBINUR neg 02/14/2015 1431   KETONESUR 15 (A) 12/04/2023 1202   PROTEINUR TRACE (A) 12/04/2023 1202   UROBILINOGEN 0.2 06/04/2016 0847   NITRITE NEGATIVE 12/04/2023 1202   LEUKOCYTESUR NEGATIVE 12/04/2023 1202    Sepsis Labs: Lactic Acid, Venous    Component Value Date/Time   LATICACIDVEN 1.4 08/08/2023 2150    MICROBIOLOGY: Recent Results (from the past 240 hours)  Urine Culture     Status: Abnormal   Collection Time: 12/04/23 12:02 PM   Specimen: Urine, Clean Catch  Result Value Ref Range Status   Specimen Description   Final    URINE, CLEAN CATCH Performed at [facility], [address] [place], [place] [address]    Special Requests   Final    NONE Performed at [facility], [address] [place], [place] [address]    Culture (A)  Final    <10,000 COLONIES/mL INSIGNIFICANT GROWTH Performed at [facility], [address], [place], [place] [address]    Report Status 12/05/2023 FINAL  Final  MRSA Next Gen by PCR, Nasal     Status: None   Collection Time: 12/04/23  6:52 PM   Specimen: Nasal Mucosa; Nasal Swab  Result Value Ref Range Status   MRSA by PCR Next Gen NOT DETECTED NOT  DETECTED Final    Comment: (NOTE) The GeneXpert MRSA Assay (FDA approved for NASAL specimens only), is one component of a comprehensive MRSA colonization surveillance program. It is not intended to  diagnose MRSA infection nor to guide or monitor treatment for MRSA infections. Test performance is not FDA approved in patients less than 6 years old. Performed at [facility], [address], [place], [place] [address]   Culture, blood (Routine X 2) w Reflex to ID Panel     Status: None   Collection Time: 12/04/23  7:03 PM   Specimen: BLOOD  Result Value Ref Range Status   Specimen Description BLOOD SITE NOT SPECIFIED  Final   Special Requests   Final    BOTTLES DRAWN AEROBIC ONLY Blood Culture results may not be optimal due to an inadequate volume of blood received in culture bottles   Culture   Final    NO GROWTH 5 DAYS Performed at [facility], [address], [place], [place] [address]    Report Status 12/09/2023 FINAL  Final  Culture, blood (Routine X 2) w Reflex to ID Panel     Status: None   Collection Time: 12/04/23  7:11 PM   Specimen: BLOOD  Result Value Ref Range Status   Specimen Description BLOOD SITE NOT SPECIFIED  Final   Special Requests   Final    BOTTLES DRAWN AEROBIC ONLY Blood Culture results may not be optimal due to an inadequate volume of blood received in culture bottles   Culture   Final    NO GROWTH 5 DAYS Performed at [facility], [address] [place], [place] [address]    Report Status 12/09/2023 FINAL  Final  Body fluid culture w Gram Stain     Status: None   Collection Time: 12/05/23  2:59 PM   Specimen: Pleural Fluid  Result Value Ref Range Status   Specimen Description PLEURAL  Final   Special Requests NONE  Final   Gram Stain NO WBC SEEN NO ORGANISMS SEEN   Final   Culture   Final    NO GROWTH 3 DAYS Performed at [facility], [address] [place], [place] [address]    Report Status 12/09/2023 FINAL  Final     RADIOLOGY STUDIES/RESULTS: DG CHEST PORT 1 VIEW Result Date: 12/09/2023 CLINICAL DATA:  Follow-up pneumothorax EXAM: PORTABLE CHEST 1 VIEW COMPARISON:  Film from the previous day. FINDINGS: Pigtail catheter is again noted on the left. Reduction in left-sided pleural effusion is noted. Left-sided pneumothorax is noted with slight increase in basilar component given the interval decrease in pleural effusion. Left basilar atelectasis is noted. Right lung is clear. Defibrillator is again noted. IMPRESSION: Decrease in left-sided pleural effusion with pneumothorax on the left. Electronically Signed   By: Violeta Grey M.D.   On: 12/09/2023 09:31     LOS: 6 days   Kimberly Penna, MD  [facility]    To contact the attending provider between 7A-7P or the covering provider during after hours 7P-7A, please log into the web site [email or web address] and access using universal Vian password for that web site. If you do not have the password, please call the hospital operator.  12/10/2023, 10:09 AM

## 2023-12-10 NOTE — Progress Notes (Signed)
 Orthopedic Tech Progress Note Patient Details:  Lisa [name] 1941-07-22 [id number]  Ortho Devices Type of Ortho Device: Ace wrap, Unna boot Ortho Device/Splint Location: BLE Ortho Device/Splint Interventions: Ordered, Application, Adjustment   Post Interventions Patient Tolerated: Well Instructions Provided: Care of device  Jaslyn Bansal L Mabelle Mungin 12/10/2023, 12:51 PM

## 2023-12-10 NOTE — Plan of Care (Signed)
 Progressing

## 2023-12-10 NOTE — Care Management Important Message (Signed)
 Important Message  Patient Details  Name: Lisa [name] MRN: [id number] Date of Birth: 10/23/1940   Important Message Given:  Yes - Medicare IM     Felix Host 12/10/2023, 12:12 PM

## 2023-12-10 NOTE — Progress Notes (Signed)
 Orthopedic Tech Progress Note Patient Details:  Lisa [name] Jun 09, 1941 [id number]  Ortho Devices Type of Ortho Device: Radio broadcast assistant Ortho Device/Splint Location: Bilateral Ortho Device/Splint Interventions: Ordered, Application   Post Interventions Patient Tolerated: Well  [name] 12/10/2023, 11:30 AM

## 2023-12-11 ENCOUNTER — Inpatient Hospital Stay (HOSPITAL_COMMUNITY)

## 2023-12-11 DIAGNOSIS — I5033 Acute on chronic diastolic (congestive) heart failure: Secondary | ICD-10-CM | POA: Diagnosis not present

## 2023-12-11 DIAGNOSIS — J948 Other specified pleural conditions: Secondary | ICD-10-CM | POA: Diagnosis not present

## 2023-12-11 DIAGNOSIS — I10 Essential (primary) hypertension: Secondary | ICD-10-CM | POA: Diagnosis not present

## 2023-12-11 DIAGNOSIS — J90 Pleural effusion, not elsewhere classified: Secondary | ICD-10-CM

## 2023-12-11 DIAGNOSIS — I4901 Ventricular fibrillation: Secondary | ICD-10-CM | POA: Diagnosis not present

## 2023-12-11 DIAGNOSIS — J9383 Other pneumothorax: Secondary | ICD-10-CM | POA: Diagnosis not present

## 2023-12-11 LAB — BASIC METABOLIC PANEL WITH GFR
Anion gap: 9 (ref 5–15)
BUN: 24 mg/dL — ABNORMAL HIGH (ref 8–23)
CO2: 29 mmol/L (ref 22–32)
Calcium: 8.6 mg/dL — ABNORMAL LOW (ref 8.9–10.3)
Chloride: 96 mmol/L — ABNORMAL LOW (ref 98–111)
Creatinine, Ser: 1.77 mg/dL — ABNORMAL HIGH (ref 0.44–1.00)
GFR, Estimated: 29 mL/min — ABNORMAL LOW (ref >60)
Glucose, Bld: 107 mg/dL — ABNORMAL HIGH (ref 70–99)
Potassium: 3.6 mmol/L (ref 3.5–5.1)
Sodium: 134 mmol/L — ABNORMAL LOW (ref 135–145)

## 2023-12-11 LAB — MAGNESIUM: Magnesium: 1.9 mg/dL (ref 1.7–2.4)

## 2023-12-11 MED ORDER — MAGNESIUM OXIDE -MG SUPPLEMENT 400 (240 MG) MG PO TABS
400.0000 mg | ORAL_TABLET | Freq: Every day | ORAL | Status: DC
  Administered 2023-12-11 – 2023-12-12 (×2): 400 mg via ORAL
  Filled 2023-12-11 (×2): qty 1

## 2023-12-11 MED ORDER — MAGNESIUM SULFATE IN D5W 1-5 GM/100ML-% IV SOLN
1.0000 g | Freq: Once | INTRAVENOUS | Status: DC
  Filled 2023-12-11: qty 100

## 2023-12-11 NOTE — Plan of Care (Signed)

## 2023-12-11 NOTE — Plan of Care (Signed)
  Problem: Education: Goal: Knowledge of General Education information will improve Description: Including pain rating scale, medication(s)/side effects and non-pharmacologic comfort measures Outcome: Progressing   Problem: Health Behavior/Discharge Planning: Goal: Ability to manage health-related needs will improve Outcome: Progressing   Problem: Clinical Measurements: Goal: Will remain free from infection Outcome: Progressing Goal: Diagnostic test results will improve Outcome: Progressing Goal: Respiratory complications will improve Outcome: Progressing Goal: Cardiovascular complication will be avoided Outcome: Progressing   Problem: Activity: Goal: Risk for activity intolerance will decrease Outcome: Progressing   Problem: Nutrition: Goal: Adequate nutrition will be maintained Outcome: Progressing   Problem: Elimination: Goal: Will not experience complications related to bowel motility Outcome: Progressing   Problem: Education: Goal: Ability to verbalize understanding of medication therapies will improve Outcome: Progressing Goal: Individualized Educational Video(s) Outcome: Progressing   Problem: Activity: Goal: Capacity to carry out activities will improve Outcome: Progressing   Problem: Cardiac: Goal: Ability to achieve and maintain adequate cardiopulmonary perfusion will improve Outcome: Progressing

## 2023-12-11 NOTE — Progress Notes (Signed)
 PROGRESS NOTE        PATIENT DETAILS Name: Lisa [name] Age: 83 y.o. Sex: female Date of Birth: [birth year] Admit Date: 12/04/2023 Admitting Physician Johnetta Nab, MD [id number], Dossie Gayer, DO  Brief Summary: Patient is a 83 y.o.  female with history of prior cardiac arrest, V. tach/V-fib-s/p ICD placement, HTN, chronic mild systolic/diastolic heart failure, CML, hypothyroidism-who had a IR paracentesis on 4/25-presented to the hospital on 4/26 with right flank/back pain-she was subsequently found to have a large left hydropneumothorax-requiring chest tube placement-she was also found to have V. tach with repeated firing of her implanted ICD-she was started on amiodarone  infusion and subsequently transferred to the ICU.  She was stabilized and transferred back to [facility] on 4/28  Significant events: 4/25>> thoracocentesis by IR-1.45 L removed. 4/26>> admit to [facility]-hydropneumothorax 4/26>> V. tach-ICD firing-amiodarone  infusion-transfer to ICU 4/27>> CT chest inserted by CCM 4/28>> transferred to [facility]  Significant studies: 4/26>> CT abdomen/pelvis: Large left hydropneumothorax. 4/27>> CT chest: Large left-sided hydropneumothorax with fluid component greater than 2 L.  Significant microbiology data: 4/26>> culture<10,000 colonies/mL insignificant growth 4/26>> blood culture: No growth 8/27>> pleural fluid culture: No growth  Procedures: 4/27>> CT chest inserted by CCM  Consults: PCCM Cardiology/EP  Subjective: No major issues overnight-finally had a bowel movement yesterday following a Dulcolax suppository.  No nausea or vomiting.  Very reluctant-refuses to get peripheral IV access started.  Objective: Vitals: Blood pressure (!) 123/53, pulse 73, temperature 98.7 F (37.1 C), temperature source Oral, resp. rate 20, height 5\' 1"  (1.549 m), weight 73.6 kg, last menstrual period 02/14/1995, SpO2 96%.   Exam: Awake/alert Nonfocal  exam Abdomen: Soft nontender nondistended  Extremities: Unna wraps  Pertinent Labs/Radiology:    Latest Ref Rng & Units 12/10/2023    5:25 AM 12/09/2023    4:28 AM 12/08/2023    4:24 AM  CBC  WBC 4.0 - 10.5 K/uL 6.4  6.8  5.6   Hemoglobin 12.0 - 15.0 g/dL 8.8  8.3  8.1   Hematocrit 36.0 - 46.0 % 27.0  25.7  24.9   Platelets 150 - 400 K/uL 211  195  172     Lab Results  Component Value Date   NA 134 (L) 12/11/2023   K 3.6 12/11/2023   CL 96 (L) 12/11/2023   CO2 29 12/11/2023     Assessment/Plan: Recurrent ventricular tachycardia ICD in place No further V. tach Paced rhythm Attempt to keep keep K> 4, Mg> 2-will K accordingly today.  No longer on IV amiodarone -has been switched to oral amiodarone .  Patient aware of driving restriction  Pneumothorax ex vacuo S/p left-sided chest tube placement Suspected trapped lung likely due to inflammation/scarring from prolonged effusion. Chest tube placed back to suction on 4/30-given lytic therapy on 5/1. Await further recommendations from PCCM.   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 Cytology negative this admit-prior cytologies x 3 negative as well.   Chronic combined systolic/diastolic heart failure (EF 40-45% with grade 1 diastolic dysfunction on 4/27) Euvolemic on exam-but does have chronic lymphedema at baseline. Continue oral diuretics.  HLD Statin  Constipation Finally had a BM after several days on 5/2-required a Dulcolax suppository Continue MiraLAX /senna.    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 Follow CBC periodically  Hypothyroidism [place] Thyroid   CKD stage IIIb Creatinine close to baseline AKI ruled out.  Chronic venous stasis changes in bilateral lower extremities/lymphedema Diuretics Unna boots  ordered 5/2.  Debility/deconditioning PT/OT eval appreciated-Home health planned on discharge. Spoke with daughter on 4/30-previously has refused SNF-has refused to work with PT/OT in the house.  Apparently lives in unhygienic conditions-hoards goods.  Appreciate case manager talking to family on 4/30-note reviewed.  Class 1 Obesity: Estimated body mass index is 30.66 kg/m as calculated from the following:   Height as of this encounter: 5\' 1"  (1.549 m).   Weight as of this encounter: 73.6 kg.   Code status:   Code Status: Full Code   DVT Prophylaxis: enoxaparin  (LOVENOX ) injection 30 mg Start: 12/10/23 1000   Family Communication: Daughter-[phone] updated over the phone on 4/30.   Disposition Plan: Status is: Inpatient Remains inpatient appropriate because: Severity of illness   Planned Discharge Destination:Home   Diet: Diet Order             Diet Heart Room service appropriate? Yes; Fluid consistency: Thin; Fluid restriction: 1800 mL Fluid  Diet effective now                     Antimicrobial agents: Anti-infectives (From admission, onward)    Start     Dose/Rate Route Frequency Ordered Stop   12/05/23 1400  cefTRIAXone  (ROCEPHIN ) 2 g in sodium chloride  0.9 % 100 mL IVPB        2 g 200 mL/hr over 30 Minutes Intravenous Every 24 hours 12/05/23 1312 12/07/23 1615   12/04/23 1732  cephALEXin  (KEFLEX ) capsule 500 mg  Status:  Discontinued        500 mg Oral Every 12 hours 12/04/23 1741 12/05/23 1312        MEDICATIONS: Scheduled Meds:  amiodarone   200 mg Oral BID   atorvastatin   20 mg Oral Daily   Chlorhexidine  Gluconate Cloth  6 each Topical Daily   enoxaparin  (LOVENOX ) injection  30 mg Subcutaneous Q24H   imatinib   400 mg Oral QHS   magnesium  oxide  400 mg Oral Daily   polyethylene glycol  17 g Oral BID   potassium chloride   40 mEq Oral Daily   senna  2 tablet Oral Daily   sodium chloride  flush  10 mL Intrapleural Q8H   thyroid   15 mg  Oral QAC breakfast   torsemide   40 mg Oral Daily   Continuous Infusions:   PRN Meds:.acetaminophen  **OR** acetaminophen , alum & mag hydroxide-simeth, bisacodyl , morphine  injection, nitroGLYCERIN , ondansetron , mouth rinse, prochlorperazine    I have personally reviewed following labs and imaging studies  LABORATORY DATA: CBC: Recent Labs  Lab 12/04/23 1144 12/04/23 1821 12/06/23 0323 12/07/23 0424 12/08/23 0424 12/09/23 0428 12/10/23 0525  WBC 4.7   < > 5.4 5.3 5.6 6.8 6.4  NEUTROABS 3.6  --   --   --   --   --   --   HGB 8.9*   < > 7.9* 8.3* 8.1* 8.3* 8.8*  HCT 27.1*   < > 25.3* 25.7* 24.9* 25.7* 27.0*  MCV 96.8   < > 99.2 97.0 97.6 97.0 96.8  PLT 173   < > 165 176 172 195 211   < > = values in this interval not displayed.    Basic Metabolic Panel: Recent Labs  Lab 12/07/23 0424 12/08/23 0424 12/09/23 0428 12/09/23 0931 12/10/23 0525 12/11/23 0421  NA 136 136  --  135 135 134*  K 3.5 3.5  --  4.3 4.4 3.6  CL 102 102  --  99 98 96*  CO2 24 25  --  27 27 29   GLUCOSE 109* 116*  --  113* 119* 107*  BUN 16 17  --  20 21 24*  CREATININE 1.66* 1.55*  --  1.61* 1.80* 1.77*  CALCIUM  8.1* 7.9*  --  8.6* 8.6* 8.6*  MG 1.9 2.0 1.9  --  2.0 1.9    GFR: Estimated Creatinine Clearance: 22.9 mL/min (A) (by C-G formula based on SCr of 1.77 mg/dL (H)).  Liver Function Tests: Recent Labs  Lab 12/04/23 1144 12/04/23 1903 12/05/23 0247  AST 29 31 25   ALT 24 23 21   ALKPHOS 78 58 46  BILITOT 0.9 1.2 0.7  PROT 6.3* 6.4* 5.3*  ALBUMIN  3.8 3.4* 2.8*   No results for input(s): "LIPASE", "AMYLASE" in the last 168 hours. No results for input(s): "AMMONIA" in the last 168 hours.  Coagulation Profile: No results for input(s): "INR", "PROTIME" in the last 168 hours.  Cardiac Enzymes: No results for input(s): "CKTOTAL", "CKMB", "CKMBINDEX", "TROPONINI" in the last 168 hours.  BNP (last 3 results) Recent Labs    12/04/23 1144  PROBNP 4,071.0*    Lipid Profile: No  results for input(s): "CHOL", "HDL", "LDLCALC", "TRIG", "CHOLHDL", "LDLDIRECT" in the last 72 hours.  Thyroid  Function Tests: No results for input(s): "TSH", "T4TOTAL", "FREET4", "T3FREE", "THYROIDAB" in the last 72 hours.   Anemia Panel: No results for input(s): "VITAMINB12", "FOLATE", "FERRITIN", "TIBC", "IRON", "RETICCTPCT" in the last 72 hours.  Urine analysis:    Component Value Date/Time   COLORURINE YELLOW 12/04/2023 1202   APPEARANCEUR CLEAR 12/04/2023 1202   LABSPEC 1.010 12/04/2023 1202   PHURINE 8.0 12/04/2023 1202   GLUCOSEU NEGATIVE 12/04/2023 1202   HGBUR NEGATIVE 12/04/2023 1202   BILIRUBINUR NEGATIVE 12/04/2023 1202   BILIRUBINUR negative 06/04/2016 0847   BILIRUBINUR neg 02/14/2015 1431   KETONESUR 15 (A) 12/04/2023 1202   PROTEINUR TRACE (A) 12/04/2023 1202   UROBILINOGEN 0.2 06/04/2016 0847   NITRITE NEGATIVE 12/04/2023 1202   LEUKOCYTESUR NEGATIVE 12/04/2023 1202    Sepsis Labs: Lactic Acid, Venous    Component Value Date/Time   LATICACIDVEN 1.4 08/08/2023 2150    MICROBIOLOGY: Recent Results (from the past 240 hours)  Urine Culture     Status: Abnormal   Collection Time: 12/04/23 12:02 PM   Specimen: Urine, Clean Catch  Result Value Ref Range Status   Specimen Description   Final    URINE, CLEAN CATCH Performed at [facility], [address] [place], [place] [address]    Special Requests   Final    NONE Performed at [facility], [address], [place], [place] [address]    Culture (A)  Final    <10,000 COLONIES/mL INSIGNIFICANT GROWTH Performed at [facility], [address] [place], [place] [address]    Report Status 12/05/2023 FINAL  Final  MRSA Next Gen by PCR, Nasal     Status: None   Collection Time: 12/04/23  6:52 PM   Specimen: Nasal Mucosa; Nasal Swab  Result Value Ref Range Status   MRSA by PCR Next Gen NOT DETECTED NOT DETECTED Final    Comment: (NOTE) The GeneXpert MRSA  Assay (FDA approved for NASAL specimens only), is one component of a comprehensive MRSA colonization surveillance program. It is not intended to diagnose MRSA infection nor to guide or monitor treatment for MRSA  infections. Test performance is not FDA approved in patients less than 67 years old. Performed at [facility], [address] [place], [place] [address]   Culture, blood (Routine X 2) w Reflex to ID Panel     Status: None   Collection Time: 12/04/23  7:03 PM   Specimen: BLOOD  Result Value Ref Range Status   Specimen Description BLOOD SITE NOT SPECIFIED  Final   Special Requests   Final    BOTTLES DRAWN AEROBIC ONLY Blood Culture results may not be optimal due to an inadequate volume of blood received in culture bottles   Culture   Final    NO GROWTH 5 DAYS Performed at [facility], [address], [place], [place] [address]    Report Status 12/09/2023 FINAL  Final  Culture, blood (Routine X 2) w Reflex to ID Panel     Status: None   Collection Time: 12/04/23  7:11 PM   Specimen: BLOOD  Result Value Ref Range Status   Specimen Description BLOOD SITE NOT SPECIFIED  Final   Special Requests   Final    BOTTLES DRAWN AEROBIC ONLY Blood Culture results may not be optimal due to an inadequate volume of blood received in culture bottles   Culture   Final    NO GROWTH 5 DAYS Performed at [facility], [address], [place], [place] [address]    Report Status 12/09/2023 FINAL  Final  Body fluid culture w Gram Stain     Status: None   Collection Time: 12/05/23  2:59 PM   Specimen: Pleural Fluid  Result Value Ref Range Status   Specimen Description PLEURAL  Final   Special Requests NONE  Final   Gram Stain NO WBC SEEN NO ORGANISMS SEEN   Final   Culture   Final    NO GROWTH 3 DAYS Performed at [facility], [address] [place], [place] [address]    Report Status 12/09/2023 FINAL  Final    RADIOLOGY STUDIES/RESULTS: DG Chest Port 1V same  Day Result Date: 12/11/2023 CLINICAL DATA:  83 year old female status post pleural catheter placement 4 effusion, left hydropneumothorax. EXAM: PORTABLE CHEST 1 VIEW COMPARISON:  Chest CT 12/04/2024. Portable chest yesterday and earlier. FINDINGS: Portable AP semi upright view at 0834 hours. Pigtail left lung base pleural catheter is stable. Substantially improved left hydropneumothorax since 12/05/2023. Since yesterday the residual left apical pneumothorax is less apparent. Pleural edge could be obscured by left chest AICD. Stable cardiac size and mediastinal contours. No mediastinal shift. No confluent lung opacity or areas of worsening ventilation. Visualized tracheal air column is within normal limits. Stable visualized osseous structures. Paucity of bowel gas. IMPRESSION: Stable left pleural catheter. Suspected small residual left pneumothorax, otherwise normal ventilation. Electronically Signed   By: Marlise Simpers M.D.   On: 12/11/2023 11:32   DG CHEST PORT 1 VIEW Result Date: 12/10/2023 CLINICAL DATA:  Pneumothorax. EXAM: PORTABLE CHEST 1 VIEW COMPARISON:  One-view chest x-ray 12/09/2023 FINDINGS: The heart is enlarged. Atherosclerotic changes are present at the aortic arch. Pacing and defibrillator wires are noted. Aeration is improved. A small left apical pneumothorax is stable. The left basilar pneumothorax is no longer visualized. IMPRESSION: 1. Stable small left apical pneumothorax. 2. Interval resolution of left basilar pneumothorax. 3. Improved aeration bilaterally. Electronically Signed   By: Audree Leas M.D.   On: 12/10/2023 11:49     LOS: 7 days   Kimberly Penna, MD  [facility]    To contact the attending provider between 7A-7P or the covering provider during after hours 7P-7A, please log into the web site [email or web address] and access using universal [place] password for that web site. If you do not have the password, please call the hospital operator.  12/11/2023, 11:36  AM

## 2023-12-11 NOTE — Progress Notes (Signed)
 NAME:  Lisa [name], MRN:  [id number], DOB:  Dec 29, 1940, LOS: 7 ADMISSION DATE:  12/04/2023, CONSULTATION DATE:  12/04/2023 REFERRING MD:  Dr. Rufina Cough - [facility], CHIEF COMPLAINT:  Pleural effusion    History of Present Illness:  Lisa [name] is a 83 y.o. female with a past medical history significant for CHF, CAD, arrhythmias including V. tach, hypertension,, CML, and melanoma who [facility] ED [facility] for complaints of progressive right-sided flank and back pain that began 4 to 5 days prior to admission.  Of note patient underwent thoracentesis per interventional radiology 2 days prior to admission for large pleural effusion, 1500 mL were removed.  Appears patient had ex vacuo pneumo post thoracentesis.  I do not see in our system to see that any samples/cytology was obtained during thoracentesis.  She also had an urgent care visit at [facility] on 4/24 for similar complaints of back pain, flank pain and nausea where she was diagnosed with E. coli UTI and started on Keflex .    Patient was admitted per hospitalist with pulmonary critical care consulted for management of hydro pneumothorax On arrival to [facility] patient went into repeated V. tach with firing of her implanted ICD.  She was transferred to ICU, started on amiodarone  infusion  Pertinent  Medical History  CHF, CAD, arrhythmias including V. tach, hypertension,, CML, and melanoma  Significant Hospital Events: Including procedures, antibiotic start and stop dates in addition to other pertinent events   4/26 presented with progressive right sided flank pain with nausea and decreased oral intake.  CT renal study with large left hydropneumothorax.  Developed V. tach arrest and transferred to ICU for amiodarone  drip and cardiology consult 4/27 cardiac condition is stabilized.  Left chest tube placed 4/29 transferred out of ICU 4/30 chest tube placed to suction  Interim History / Subjective:   Good output  after pleural lytics on 5/1. Air leak present on exam today Patient without complaints  Objective   Blood pressure (!) 123/53, pulse 73, temperature 98.7 F (37.1 C), temperature source Oral, resp. rate 20, height 5\' 1"  (1.549 m), weight 73.6 kg, last menstrual period 02/14/1995, SpO2 96%.        Intake/Output Summary (Last 24 hours) at 12/11/2023 1349 Last data filed at 12/11/2023 0548 Gross per 24 hour  Intake 300 ml  Output 100 ml  Net 200 ml   Filed Weights   12/07/23 0500 12/08/23 0500 12/09/23 0500  Weight: 84.4 kg 77.7 kg 73.6 kg   Examination: Gen:      No acute distress, elderly HEENT:  sclera anicteric Neck:     No masses; no thyromegaly Lungs:    Clear to auscultation bilaterally; normal respiratory effort. Chest tube with small intermittent air leak CV:         Regular rate and rhythm; no murmurs Abd:      + bowel sounds; soft, non-tender; no palpable masses, no distension Ext:    No edema; adequate peripheral perfusion Skin:      Warm and dry; no rash Neuro: alert and oriented x 3  CXR 5/3 - small apical pneumothorax  Resolved Hospital Problem list     Assessment & Plan:   Pneumothorax  Pleural Effusion - pleural fluid 4/27 exudate by protein on Lights, likely pseudo, transudate by LDH P:  - Persistent small pneumothorax on serial chest x-rays, slowly improving - Air leak present on chest tube, continue on suction - Will transition to water  seal  in near future. - significant effusion improvement after pleural lytics on 5/1  Remainder per primary team.  Pulmonary will continue to follow for CT management  Best Practice (right click and "Reselect all SmartList Selections" daily)  Per primary team- [facility]  Signature:   Duaine German, MD [facility] Office: [phone]   See Amion for personal pager PCCM on call pager [phone] until 7pm. Please call Elink 7p-7a. [phone]

## 2023-12-11 NOTE — Progress Notes (Signed)
 Mobility Specialist: Progress Note   12/11/23 1314  Mobility  Activity Ambulated with assistance in hallway  Level of Assistance Contact guard assist, steadying assist  Assistive Device Four wheel walker  Distance Ambulated (ft) 400 ft  Activity Response Tolerated fair  Mobility Referral Yes  Mobility visit 1 Mobility  Mobility Specialist Start Time (ACUTE ONLY) 1020  Mobility Specialist Stop Time (ACUTE ONLY) 1051  Mobility Specialist Time Calculation (min) (ACUTE ONLY) 31 min    During Mobility: HR 96-113, SpO2 91-96% RA  Pt was agreeable to mobility session - received in bed. Requested to use BSC first. Void successful and pt completed pericare ind. SV for bed mobility. CG for STS and ambulation. Took 2x seated break d/t fatigue and nausea during ambulation. VSS on RA. Returned to room without fault. Left in chair with all needs met, call bell in reach.   Deloria Fetch Mobility Specialist Please contact via SecureChat or Rehab office at [phone]

## 2023-12-12 ENCOUNTER — Inpatient Hospital Stay (HOSPITAL_COMMUNITY)

## 2023-12-12 DIAGNOSIS — J90 Pleural effusion, not elsewhere classified: Secondary | ICD-10-CM | POA: Diagnosis not present

## 2023-12-12 DIAGNOSIS — J948 Other specified pleural conditions: Secondary | ICD-10-CM | POA: Diagnosis not present

## 2023-12-12 DIAGNOSIS — J9383 Other pneumothorax: Secondary | ICD-10-CM | POA: Diagnosis not present

## 2023-12-12 LAB — CBC
HCT: 25.7 % — ABNORMAL LOW (ref 36.0–46.0)
Hemoglobin: 8.4 g/dL — ABNORMAL LOW (ref 12.0–15.0)
MCH: 31.7 pg (ref 26.0–34.0)
MCHC: 32.7 g/dL (ref 30.0–36.0)
MCV: 97 fL (ref 80.0–100.0)
Platelets: 202 10*3/uL (ref 150–400)
RBC: 2.65 MIL/uL — ABNORMAL LOW (ref 3.87–5.11)
RDW: 15.2 % (ref 11.5–15.5)
WBC: 5.8 10*3/uL (ref 4.0–10.5)
nRBC: 0 % (ref 0.0–0.2)

## 2023-12-12 LAB — BASIC METABOLIC PANEL WITH GFR
Anion gap: 8 (ref 5–15)
BUN: 26 mg/dL — ABNORMAL HIGH (ref 8–23)
CO2: 30 mmol/L (ref 22–32)
Calcium: 8.4 mg/dL — ABNORMAL LOW (ref 8.9–10.3)
Chloride: 97 mmol/L — ABNORMAL LOW (ref 98–111)
Creatinine, Ser: 1.86 mg/dL — ABNORMAL HIGH (ref 0.44–1.00)
GFR, Estimated: 27 mL/min — ABNORMAL LOW (ref >60)
Glucose, Bld: 111 mg/dL — ABNORMAL HIGH (ref 70–99)
Potassium: 3.9 mmol/L (ref 3.5–5.1)
Sodium: 135 mmol/L (ref 135–145)

## 2023-12-12 LAB — MAGNESIUM: Magnesium: 1.8 mg/dL (ref 1.7–2.4)

## 2023-12-12 NOTE — Progress Notes (Signed)
 NAME:  Lisa [name], MRN:  [id number], DOB:  [birth year], LOS: 8 ADMISSION DATE:  12/04/2023, CONSULTATION DATE:  12/04/2023 REFERRING MD:  Dr. Rufina Cough - [facility], CHIEF COMPLAINT:  Pleural effusion    History of Present Illness:  Lisa [name] is a 83 y.o. female with a past medical history significant for CHF, CAD, arrhythmias including V. tach, hypertension,, CML, and melanoma who presented to the ED ED at [facility] 4/26 for complaints of progressive right-sided flank and back pain that began 4 to 5 days prior to admission.  Of note patient underwent thoracentesis per interventional radiology 2 days prior to admission for large pleural effusion, 1500 mL were removed.  Appears patient had ex vacuo pneumo post thoracentesis.  I do not see in our system to see that any samples/cytology was obtained during thoracentesis.  She also had an urgent care visit at [facility] on 4/24 for similar complaints of back pain, flank pain and nausea where she was diagnosed with E. coli UTI and started on Keflex .    Patient was admitted per hospitalist with pulmonary critical care consulted for management of hydro pneumothorax On arrival to [facility] patient went into repeated V. tach with firing of her implanted ICD.  She was transferred to ICU, started on amiodarone  infusion  Pertinent  Medical History  CHF, CAD, arrhythmias including V. tach, hypertension,, CML, and melanoma  Significant Hospital Events: Including procedures, antibiotic start and stop dates in addition to other pertinent events   4/26 presented with progressive right sided flank pain with nausea and decreased oral intake.  CT renal study with large left hydropneumothorax.  Developed V. tach arrest and transferred to ICU for amiodarone  drip and cardiology consult 4/27 cardiac condition is stabilized.  Left chest tube placed 4/29 transferred out of ICU 4/30 chest tube placed to suction 5/3 chest tube with air leak present, small  apical pneumothorax  Interim History / Subjective:   Air leak persists on chest tube X-ray without definite pneumothorax per radiology today Patient without complaints  Objective   Blood pressure (!) 116/55, pulse 71, temperature 98.4 F (36.9 C), temperature source Oral, resp. rate 20, height 5\' 1"  (1.549 m), weight 73.6 kg, last menstrual period 02/14/1995, SpO2 97%.        Intake/Output Summary (Last 24 hours) at 12/12/2023 1256 Last data filed at 12/12/2023 6295 Gross per 24 hour  Intake --  Output 260 ml  Net -260 ml   Filed Weights   12/07/23 0500 12/08/23 0500 12/09/23 0500  Weight: 84.4 kg 77.7 kg 73.6 kg   Examination: Gen:      No acute distress, elderly HEENT:  sclera anicteric Neck:     No masses; no thyromegaly Lungs:    Clear to auscultation bilaterally; normal respiratory effort. Chest tube with small intermittent air leak CV:         Regular rate and rhythm; no murmurs Abd:      + bowel sounds; soft, non-tender; no palpable masses, no distension Ext:    No edema; adequate peripheral perfusion Skin:      Warm and dry; no rash Neuro: alert and oriented x 3  CXR 5/3 - small apical pneumothorax CXR 5/4 - no pneumothorax noted  Resolved Hospital Problem list     Assessment & Plan:   Pneumothorax  Pleural Effusion - pleural fluid 4/27 exudate by protein on Lights, likely pseudo, transudate by LDH P:  - Pneumothorax improved on today's x-ray per radiology - Air leak  present on chest tube, continue on suction - Will obtain CT Chest without contrast tomorrow to evaluate for defect leading to persistent air leak. Will determine need for possible procedure to stop air leak or whether we continue process with current chest tube vs adding heimlich valve for discharge and following up outpatient. - Will consider CT surgery consult based on CT Chest results - significant effusion improvement after pleural lytics on 5/1  Remainder per primary team.  Pulmonary will  continue to follow for CT management  Best Practice (right click and "Reselect all SmartList Selections" daily)  Per primary team- [facility]  Signature:   Duaine German, MD [facility] Office: [phone]   See Amion for personal pager PCCM on call pager [phone] until 7pm. Please call Elink 7p-7a. [phone]

## 2023-12-12 NOTE — Progress Notes (Signed)
 PROGRESS NOTE        PATIENT DETAILS Name: Lisa [name] Age: 83 y.o. Sex: female Date of Birth: August 25, 1940 Admit Date: 12/04/2023 Admitting Physician Johnetta Nab, MD [id number], Dossie Gayer, DO  Brief Summary: Patient is a 83 y.o.  female with history of prior cardiac arrest, V. tach/V-fib-s/p ICD placement, HTN, chronic mild systolic/diastolic heart failure, CML, hypothyroidism-who had a IR paracentesis on 4/25-presented to the hospital on 4/26 with right flank/back pain-she was subsequently found to have a large left hydropneumothorax-requiring chest tube placement-she was also found to have V. tach with repeated firing of her implanted ICD-she was started on amiodarone  infusion and subsequently transferred to the ICU.  She was stabilized and transferred back to [facility] on 4/28  Significant events: 4/25>> thoracocentesis by IR-1.45 L removed. 4/26>> admit to [facility]-hydropneumothorax 4/26>> V. tach-ICD firing-amiodarone  infusion-transfer to ICU 4/27>> CT chest inserted by CCM 4/28>> transferred to [facility]  Significant studies: 4/26>> CT abdomen/pelvis: Large left hydropneumothorax. 4/27>> CT chest: Large left-sided hydropneumothorax with fluid component greater than 2 L.  Significant microbiology data: 4/26>> culture<10,000 colonies/mL insignificant growth 4/26>> blood culture: No growth 8/27>> pleural fluid culture: No growth  Procedures: 4/27>> CT chest inserted by CCM  Consults: PCCM Cardiology/EP  Subjective:  Patient in bed, appears comfortable, denies any headache, no fever, no chest pain or pressure, no shortness of breath , no abdominal pain. No new focal weakness.   Objective: Vitals: Blood pressure (!) 129/53, pulse 70, temperature 98.4 F (36.9 C), temperature source Oral, resp. rate 20, height 5\' 1"  (1.549 m), weight 73.6 kg, last menstrual period 02/14/1995, SpO2 96%.   Exam:  Awake Alert, No new F.N deficits, Normal  affect .AT,PERRAL Supple Neck, No JVD,   Symmetrical Chest wall movement, left-sided chest tube in place, good air movement bilaterally RRR,No Gallops, Rubs or new Murmurs,  +ve B.Sounds, Abd Soft, No tenderness,   chronic bilateral lower extremity lymphedema    Assessment/Plan: Recurrent ventricular tachycardia ICD in place No further V. tach Paced rhythm Attempt to keep keep K> 4, Mg> 2-will K accordingly today.  No longer on IV amiodarone -has been switched to oral amiodarone .  Patient aware of driving restriction  Pneumothorax ex vacuo S/p left-sided chest tube placement Suspected trapped lung likely due to inflammation/scarring from prolonged effusion. Chest tube placed back to suction on 4/30-given lytic therapy on 5/1. Await further recommendations from PCCM.   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 Cytology negative this admit-prior cytologies x 3 negative as well.   Chronic combined systolic/diastolic heart failure (EF 40-45% with grade 1 diastolic dysfunction on 4/27) Euvolemic on exam-but does have chronic lymphedema at baseline. Continue oral diuretics.  HLD Statin  Constipation Finally had a BM after several days on 5/2-required a Dulcolax suppository Continue MiraLAX /senna.    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 Follow CBC periodically  Hypothyroidism Armour Thyroid   CKD stage IIIb Creatinine close to baseline AKI ruled out.  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  Debility/deconditioning PT/OT eval appreciated-Home health planned on discharge. Spoke with daughter on 4/30-previously has refused SNF-has refused to work with PT/OT in the house.  Apparently lives in  unhygienic conditions-hoards goods.  Appreciate  case manager talking to family on 4/30-note reviewed.  Class 1 Obesity: Estimated body mass index is 30.66 kg/m as calculated from the following:   Height as of this encounter: 5\' 1"  (1.549 m).   Weight as of this encounter: 73.6 kg.   Code status:   Code Status: Full Code   DVT Prophylaxis: enoxaparin  (LOVENOX ) injection 30 mg Start: 12/10/23 1000   Family Communication: Daughter-Shannon-[phone] updated over the phone on 4/30.   Disposition Plan: Status is: Inpatient Remains inpatient appropriate because: Severity of illness   Planned Discharge Destination:Home   Diet: Diet Order             Diet Heart Room service appropriate? Yes; Fluid consistency: Thin; Fluid restriction: 1800 mL Fluid  Diet effective now                     Antimicrobial agents: Anti-infectives (From admission, onward)    Start     Dose/Rate Route Frequency Ordered Stop   12/05/23 1400  cefTRIAXone  (ROCEPHIN ) 2 g in sodium chloride  0.9 % 100 mL IVPB        2 g 200 mL/hr over 30 Minutes Intravenous Every 24 hours 12/05/23 1312 12/07/23 1615   12/04/23 1732  cephALEXin  (KEFLEX ) capsule 500 mg  Status:  Discontinued        500 mg Oral Every 12 hours 12/04/23 1741 12/05/23 1312        MEDICATIONS: Scheduled Meds:  amiodarone   200 mg Oral BID   atorvastatin   20 mg Oral Daily   Chlorhexidine  Gluconate Cloth  6 each Topical Daily   enoxaparin  (LOVENOX ) injection  30 mg Subcutaneous Q24H   magnesium  oxide  400 mg Oral Daily   polyethylene glycol  17 g Oral BID   potassium chloride   40 mEq Oral Daily   senna  2 tablet Oral Daily   sodium chloride  flush  10 mL Intrapleural Q8H   thyroid   15 mg Oral QAC breakfast   torsemide   40 mg Oral Daily   Continuous Infusions:   PRN Meds:.acetaminophen  **OR** acetaminophen , alum & mag hydroxide-simeth, bisacodyl , morphine  injection, nitroGLYCERIN , ondansetron , mouth rinse,  prochlorperazine    I have personally reviewed following labs and imaging studies  LABORATORY DATA: CBC: Recent Labs  Lab 12/07/23 0424 12/08/23 0424 12/09/23 0428 12/10/23 0525 12/12/23 0552  WBC 5.3 5.6 6.8 6.4 5.8  HGB 8.3* 8.1* 8.3* 8.8* 8.4*  HCT 25.7* 24.9* 25.7* 27.0* 25.7*  MCV 97.0 97.6 97.0 96.8 97.0  PLT 176 172 195 211 202    Basic Metabolic Panel: Recent Labs  Lab 12/08/23 0424 12/09/23 0428 12/09/23 0931 12/10/23 0525 12/11/23 0421 12/12/23 0552 12/12/23 0558  NA 136  --  135 135 134* 135  --   K 3.5  --  4.3 4.4 3.6 3.9  --   CL 102  --  99 98 96* 97*  --   CO2 25  --  27 27 29 30   --   GLUCOSE 116*  --  113* 119* 107* 111*  --   BUN 17  --  20 21 24* 26*  --   CREATININE 1.55*  --  1.61* 1.80* 1.77* 1.86*  --   CALCIUM  7.9*  --  8.6* 8.6* 8.6* 8.4*  --   MG 2.0 1.9  --  2.0 1.9  --  1.8    GFR: Estimated Creatinine Clearance: 21.8 mL/min (A) (by C-G formula based on SCr of 1.86 mg/dL (H)).  Liver Function Tests: No  results for input(s): "AST", "ALT", "ALKPHOS", "BILITOT", "PROT", "ALBUMIN " in the last 168 hours.  No results for input(s): "LIPASE", "AMYLASE" in the last 168 hours. No results for input(s): "AMMONIA" in the last 168 hours.  Coagulation Profile: No results for input(s): "INR", "PROTIME" in the last 168 hours.  Cardiac Enzymes: No results for input(s): "CKTOTAL", "CKMB", "CKMBINDEX", "TROPONINI" in the last 168 hours.  BNP (last 3 results) Recent Labs    12/04/23 1144  PROBNP 4,071.0*    Lipid Profile: No results for input(s): "CHOL", "HDL", "LDLCALC", "TRIG", "CHOLHDL", "LDLDIRECT" in the last 72 hours.  Thyroid  Function Tests: No results for input(s): "TSH", "T4TOTAL", "FREET4", "T3FREE", "THYROIDAB" in the last 72 hours.   Anemia Panel: No results for input(s): "VITAMINB12", "FOLATE", "FERRITIN", "TIBC", "IRON", "RETICCTPCT" in the last 72 hours.  Urine analysis:    Component Value Date/Time   COLORURINE YELLOW  12/04/2023 1202   APPEARANCEUR CLEAR 12/04/2023 1202   LABSPEC 1.010 12/04/2023 1202   PHURINE 8.0 12/04/2023 1202   GLUCOSEU NEGATIVE 12/04/2023 1202   HGBUR NEGATIVE 12/04/2023 1202   BILIRUBINUR NEGATIVE 12/04/2023 1202   BILIRUBINUR negative 06/04/2016 0847   BILIRUBINUR neg 02/14/2015 1431   KETONESUR 15 (A) 12/04/2023 1202   PROTEINUR TRACE (A) 12/04/2023 1202   UROBILINOGEN 0.2 06/04/2016 0847   NITRITE NEGATIVE 12/04/2023 1202   LEUKOCYTESUR NEGATIVE 12/04/2023 1202    Sepsis Labs: Lactic Acid, Venous    Component Value Date/Time   LATICACIDVEN 1.4 08/08/2023 2150    MICROBIOLOGY: Recent Results (from the past 240 hours)  Urine Culture     Status: Abnormal   Collection Time: 12/04/23 12:02 PM   Specimen: Urine, Clean Catch  Result Value Ref Range Status   Specimen Description   Final    URINE, CLEAN CATCH Performed at [facility], [address] [place], [place] [address]    Special Requests   Final    NONE Performed at [facility], [address], [place], [place] [address]    Culture (A)  Final    <10,000 COLONIES/mL INSIGNIFICANT GROWTH Performed at [facility], [address], [place], [place] [address]    Report Status 12/05/2023 FINAL  Final  MRSA Next Gen by PCR, Nasal     Status: None   Collection Time: 12/04/23  6:52 PM   Specimen: Nasal Mucosa; Nasal Swab  Result Value Ref Range Status   MRSA by PCR Next Gen NOT DETECTED NOT DETECTED Final    Comment: (NOTE) The GeneXpert MRSA Assay (FDA approved for NASAL specimens only), is one component of a comprehensive MRSA colonization surveillance program. It is not intended to diagnose MRSA infection nor to guide or monitor treatment for MRSA infections. Test performance is not FDA approved in patients less than 60 years old. Performed at [facility], [address], [place], [place] [address]   Culture, blood (Routine X 2) w Reflex to ID Panel      Status: None   Collection Time: 12/04/23  7:03 PM   Specimen: BLOOD  Result Value Ref Range Status   Specimen Description BLOOD SITE NOT SPECIFIED  Final   Special Requests   Final    BOTTLES DRAWN AEROBIC ONLY Blood Culture results may not be optimal due to an inadequate volume of blood received in culture bottles   Culture   Final    NO GROWTH 5 DAYS Performed at [facility], [address] [place], [place] [address]  Report Status 12/09/2023 FINAL  Final  Culture, blood (Routine X 2) w Reflex to ID Panel     Status: None   Collection Time: 12/04/23  7:11 PM   Specimen: BLOOD  Result Value Ref Range Status   Specimen Description BLOOD SITE NOT SPECIFIED  Final   Special Requests   Final    BOTTLES DRAWN AEROBIC ONLY Blood Culture results may not be optimal due to an inadequate volume of blood received in culture bottles   Culture   Final    NO GROWTH 5 DAYS Performed at [facility], [address] [place], [place] [address]    Report Status 12/09/2023 FINAL  Final  Body fluid culture w Gram Stain     Status: None   Collection Time: 12/05/23  2:59 PM   Specimen: Pleural Fluid  Result Value Ref Range Status   Specimen Description PLEURAL  Final   Special Requests NONE  Final   Gram Stain NO WBC SEEN NO ORGANISMS SEEN   Final   Culture   Final    NO GROWTH 3 DAYS Performed at [facility], [address] [place], [place] [address]    Report Status 12/09/2023 FINAL  Final    RADIOLOGY STUDIES/RESULTS: DG Chest Port 1V same Day Result Date: 12/11/2023 CLINICAL DATA:  83 year old female status post pleural catheter placement 4 effusion, left hydropneumothorax. EXAM: PORTABLE CHEST 1 VIEW COMPARISON:  Chest CT 12/04/2024. Portable chest yesterday and earlier. FINDINGS: Portable AP semi upright view at 0834 hours. Pigtail left lung base pleural catheter is stable. Substantially improved left hydropneumothorax since 12/05/2023. Since yesterday the  residual left apical pneumothorax is less apparent. Pleural edge could be obscured by left chest AICD. Stable cardiac size and mediastinal contours. No mediastinal shift. No confluent lung opacity or areas of worsening ventilation. Visualized tracheal air column is within normal limits. Stable visualized osseous structures. Paucity of bowel gas. IMPRESSION: Stable left pleural catheter. Suspected small residual left pneumothorax, otherwise normal ventilation. Electronically Signed   By: Marlise Simpers M.D.   On: 12/11/2023 11:32     LOS: 8 days   Signature  -    Lynnwood Sauer M.D on 12/12/2023 at 9:41 AM   -  To page go to [email or web address]

## 2023-12-12 NOTE — Plan of Care (Signed)
  Problem: Education: Goal: Knowledge of General Education information will improve Description: Including pain rating scale, medication(s)/side effects and non-pharmacologic comfort measures Outcome: Progressing   Problem: Health Behavior/Discharge Planning: Goal: Ability to manage health-related needs will improve Outcome: Progressing   Problem: Clinical Measurements: Goal: Ability to maintain clinical measurements within normal limits will improve Outcome: Progressing Goal: Will remain free from infection Outcome: Progressing Goal: Diagnostic test results will improve Outcome: Progressing Goal: Respiratory complications will improve Outcome: Progressing Goal: Cardiovascular complication will be avoided Outcome: Progressing   Problem: Activity: Goal: Risk for activity intolerance will decrease Outcome: Progressing   Problem: Nutrition: Goal: Adequate nutrition will be maintained Outcome: Progressing   Problem: Coping: Goal: Level of anxiety will decrease Outcome: Progressing   Problem: Elimination: Goal: Will not experience complications related to bowel motility Outcome: Progressing Goal: Will not experience complications related to urinary retention Outcome: Progressing   Problem: Pain Managment: Goal: General experience of comfort will improve and/or be controlled Outcome: Progressing   Problem: Safety: Goal: Ability to remain free from injury will improve Outcome: Progressing   Problem: Skin Integrity: Goal: Risk for impaired skin integrity will decrease Outcome: Progressing   Problem: Education: Goal: Ability to demonstrate management of disease process will improve Outcome: Progressing Goal: Ability to verbalize understanding of medication therapies will improve Outcome: Progressing Goal: Individualized Educational Video(s) Outcome: Progressing

## 2023-12-12 NOTE — Progress Notes (Signed)
 Mobility Specialist: Progress Note   12/12/23 1529  Mobility  Activity Ambulated with assistance in hallway  Level of Assistance Standby assist, set-up cues, supervision of patient - no hands on  Assistive Device Four wheel walker  Distance Ambulated (ft) 100 ft  Activity Response Tolerated poorly;RN notified  Mobility Referral Yes  Mobility visit 1 Mobility  Mobility Specialist Start Time (ACUTE ONLY) 1236  Mobility Specialist Stop Time (ACUTE ONLY) 1305  Mobility Specialist Time Calculation (min) (ACUTE ONLY) 29 min    Pt was agreeable to mobility session - received in chair. Requested to use BSC first. Void and very small BM successful - pt completed pericare ind. SV throughout. Began to feel nauseous about midway through the walk and started throwing up. MS deffered further mobility and returned pt to room despite pt wanting to mobilize further after nausea. VSS on RA throughout. Returned to room without fault. Left in bed with all needs met, call bell in reach. RN notified.   Deloria Fetch Mobility Specialist Please contact via SecureChat or Rehab office at [phone]

## 2023-12-13 ENCOUNTER — Inpatient Hospital Stay (HOSPITAL_COMMUNITY)

## 2023-12-13 DIAGNOSIS — J948 Other specified pleural conditions: Secondary | ICD-10-CM | POA: Diagnosis not present

## 2023-12-13 DIAGNOSIS — J9383 Other pneumothorax: Secondary | ICD-10-CM | POA: Diagnosis not present

## 2023-12-13 LAB — BASIC METABOLIC PANEL WITH GFR
Anion gap: 10 (ref 5–15)
BUN: 28 mg/dL — ABNORMAL HIGH (ref 8–23)
CO2: 27 mmol/L (ref 22–32)
Calcium: 8.6 mg/dL — ABNORMAL LOW (ref 8.9–10.3)
Chloride: 97 mmol/L — ABNORMAL LOW (ref 98–111)
Creatinine, Ser: 1.9 mg/dL — ABNORMAL HIGH (ref 0.44–1.00)
GFR, Estimated: 26 mL/min — ABNORMAL LOW (ref >60)
Glucose, Bld: 128 mg/dL — ABNORMAL HIGH (ref 70–99)
Potassium: 3.7 mmol/L (ref 3.5–5.1)
Sodium: 134 mmol/L — ABNORMAL LOW (ref 135–145)

## 2023-12-13 LAB — SODIUM, URINE, RANDOM: Sodium, Ur: 28 mmol/L

## 2023-12-13 LAB — OSMOLALITY, URINE: Osmolality, Ur: 324 mosm/kg (ref 300–900)

## 2023-12-13 LAB — CREATININE, URINE, RANDOM: Creatinine, Urine: 71 mg/dL

## 2023-12-13 LAB — URIC ACID: Uric Acid, Serum: 6.4 mg/dL (ref 2.5–7.1)

## 2023-12-13 LAB — MAGNESIUM: Magnesium: 1.6 mg/dL — ABNORMAL LOW (ref 1.7–2.4)

## 2023-12-13 LAB — OSMOLALITY: Osmolality: 293 mosm/kg (ref 275–295)

## 2023-12-13 MED ORDER — LACTATED RINGERS IV SOLN: INTRAVENOUS | Status: AC

## 2023-12-13 MED ORDER — TORSEMIDE 20 MG PO TABS
40.0000 mg | ORAL_TABLET | Freq: Every day | ORAL | Status: DC
  Administered 2023-12-14 – 2023-12-16 (×3): 40 mg via ORAL
  Filled 2023-12-13 (×3): qty 2

## 2023-12-13 MED ORDER — MAGNESIUM SULFATE 4 GM/100ML IV SOLN
4.0000 g | Freq: Once | INTRAVENOUS | Status: AC
  Administered 2023-12-13: 4 g via INTRAVENOUS
  Filled 2023-12-13: qty 100

## 2023-12-13 MED ORDER — MAGNESIUM OXIDE -MG SUPPLEMENT 400 (240 MG) MG PO TABS
400.0000 mg | ORAL_TABLET | Freq: Every day | ORAL | Status: DC
  Administered 2023-12-14 – 2023-12-16 (×3): 400 mg via ORAL
  Filled 2023-12-13 (×3): qty 1

## 2023-12-13 NOTE — Progress Notes (Signed)
   12/13/23 1116  Mobility  Activity Transferred to/from BSC;Repositioned in chair  Level of Assistance Standby assist, set-up cues, supervision of patient - no hands on  Assistive Device Four wheel walker  Range of Motion/Exercises Left arm;Right arm;Active  Activity Response Tolerated fair  Mobility Referral Yes  Mobility visit 1 Mobility  Mobility Specialist Start Time (ACUTE ONLY) 1116  Mobility Specialist Stop Time (ACUTE ONLY) 1148  Mobility Specialist Time Calculation (min) (ACUTE ONLY) 32 min   Mobility Specialist: Progress Note  During Mobility:              SpO2 97% RA Post-Mobility:    HR 81, SpO2 100% RA  Pt agreeable to mobility session - received in chair - requesting to use BSC. BUE exercises , pericare, washing face, and brushing teeth all completed ind. Pt c/o slight pinching at chest tube site, otherwise was asymptomatic throughout session with no complaints.Returned to chair with all needs met - call bell within reach. Chair alarm on. RN present.   BUE Exercises (x15): Bicep Curls, Lateral Raises, chest press, shoulder shrugs, arm circles forward/backwards, and hand squeezes. ___________________________________  Pt agreeable to mobility session - responding to call light - requesting to go to bed - received on BSC. Pt was asymptomatic throughout session with no complaints. Returned to bed with all needs met - call bell within reach.   Lisa [name], BS Mobility Specialist Please contact via SecureChat or  Rehab office at [phone].

## 2023-12-13 NOTE — Progress Notes (Signed)
 PROGRESS NOTE        PATIENT DETAILS Name: Lisa [name] Age: 83 y.o. Sex: female Date of Birth: 1940/09/18 Admit Date: 12/04/2023 Admitting Physician Johnetta Nab, MD [id number], Dossie Gayer, DO  Brief Summary: Patient is a 83 y.o.  female with history of prior cardiac arrest, V. tach/V-fib-s/p ICD placement, HTN, chronic mild systolic/diastolic heart failure, CML, hypothyroidism-who had a IR paracentesis on 4/25-presented to the hospital on 4/26 with right flank/back pain-she was subsequently found to have a large left hydropneumothorax-requiring chest tube placement-she was also found to have V. tach with repeated firing of her implanted ICD-she was started on amiodarone  infusion and subsequently transferred to the ICU.  She was stabilized and transferred back to [facility] on 4/28  Significant events: 4/25>> thoracocentesis by IR-1.45 L removed. 4/26>> admit to [facility]-hydropneumothorax 4/26>> V. tach-ICD firing-amiodarone  infusion-transfer to ICU 4/27>> CT chest inserted by CCM 4/28>> transferred to [facility]  Significant studies: 4/26>> CT abdomen/pelvis: Large left hydropneumothorax. 4/27>> CT chest: Large left-sided hydropneumothorax with fluid component greater than 2 L.  Significant microbiology data: 4/26>> culture<10,000 colonies/mL insignificant growth 4/26>> blood culture: No growth 8/27>> pleural fluid culture: No growth  Procedures: 4/27>> CT chest inserted by CCM  Consults: PCCM Cardiology/EP  Subjective:  Patient in bed, appears comfortable, denies any headache, no fever, no chest pain or pressure, no shortness of breath , no abdominal pain, mild but improved nausea, was constipated but had a BM this am. No new focal weakness.    Objective: Vitals: Blood pressure (!) 126/51, pulse 72, temperature 98.2 F (36.8 C), temperature source Oral, resp. rate 19, height 5\' 1"  (1.549 m), weight 72.7 kg, last menstrual period 02/14/1995, SpO2  96%.   Exam:  Awake Alert, No new F.N deficits, Normal affect [place].AT,PERRAL Supple Neck, No JVD,   Symmetrical Chest wall movement, left-sided chest tube in place, good air movement bilaterally RRR,No Gallops, Rubs or new Murmurs,  +ve B.Sounds, Abd Soft, No tenderness,   chronic bilateral lower extremity lymphedema    Assessment/Plan:  Recurrent ventricular tachycardia ICD in place No further V. tach Paced rhythm Attempt to keep keep K> 4, Mg> 2-will K accordingly today.  No longer on IV amiodarone -has been switched to oral amiodarone .  Patient aware of driving restriction  Pneumothorax ex vacuo S/p left-sided chest tube placement Suspected trapped lung likely due to inflammation/scarring from prolonged effusion. Chest tube placed back to suction on 4/30-given lytic therapy on 12/09/23. Await further recommendations from PCCM. 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 Cytology negative this admit-prior cytologies x 3 negative as well.   Chronic combined systolic/diastolic heart failure (EF 40-45% with grade 1 diastolic dysfunction on 4/27) Euvolemic on exam-but does have chronic lymphedema at baseline. Continue oral diuretics(skip on 12/13/23) gentle IVF as mild AKI - N&V on 12/12/23.  HLD Statin  Constipation Finally had a BM after several days on 5/2-required a Dulcolax suppository Continue MiraLAX /senna.    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 Follow CBC periodically  Hypothyroidism [place] Thyroid   CKD stage IIIb Mild AKI on 12/13/2023 due to nausea vomiting on 12/12/2023, gentle IV fluids and skip diuretics on 12/13/2023 and monitor.  Chronic venous stasis  changes in bilateral lower extremities/lymphedema Diuretics Unna boots  ordered 5/2.  Debility/deconditioning PT/OT eval appreciated-Home health planned on discharge. Spoke with daughter on 4/30-previously has refused SNF-has refused to work with PT/OT in the house.  Apparently lives in unhygienic conditions-hoards goods.  Appreciate case manager talking to family on 4/30-note reviewed.  Class 1 Obesity: Estimated body mass index is 30.28 kg/m as calculated from the following:   Height as of this encounter: 5\' 1"  (1.549 m).   Weight as of this encounter: 72.7 kg.   Code status:   Code Status: Full Code   DVT Prophylaxis: enoxaparin  (LOVENOX ) injection 30 mg Start: 12/10/23 1000   Family Communication: Daughter-[phone] updated over the phone on 4/30.   Disposition Plan: Status is: Inpatient Remains inpatient appropriate because: Severity of illness   Planned Discharge Destination:Home   Diet: Diet Order             Diet Heart Room service appropriate? Yes; Fluid consistency: Thin; Fluid restriction: 1800 mL Fluid  Diet effective now                     Antimicrobial agents: Anti-infectives (From admission, onward)    Start     Dose/Rate Route Frequency Ordered Stop   12/05/23 1400  cefTRIAXone  (ROCEPHIN ) 2 g in sodium chloride  0.9 % 100 mL IVPB        2 g 200 mL/hr over 30 Minutes Intravenous Every 24 hours 12/05/23 1312 12/07/23 1615   12/04/23 1732  cephALEXin  (KEFLEX ) capsule 500 mg  Status:  Discontinued        500 mg Oral Every 12 hours 12/04/23 1741 12/05/23 1312        MEDICATIONS: Scheduled Meds:  amiodarone   200 mg Oral BID   atorvastatin   20 mg Oral Daily   Chlorhexidine  Gluconate Cloth  6 each Topical Daily   enoxaparin  (LOVENOX ) injection  30 mg Subcutaneous Q24H   [START ON 12/14/2023] magnesium  oxide  400 mg Oral Daily   polyethylene glycol  17 g Oral BID   potassium chloride   40 mEq Oral Daily   senna  2 tablet Oral Daily   sodium chloride  flush  10 mL Intrapleural Q8H   thyroid   15 mg Oral QAC  breakfast   torsemide   40 mg Oral Daily   Continuous Infusions:  magnesium  sulfate bolus IVPB 4 g (12/13/23 0624)    PRN Meds:.acetaminophen  **OR** acetaminophen , alum & mag hydroxide-simeth, bisacodyl , morphine  injection, nitroGLYCERIN , ondansetron , mouth rinse, prochlorperazine    I have personally reviewed following labs and imaging studies  LABORATORY DATA: CBC: Recent Labs  Lab 12/07/23 0424 12/08/23 0424 12/09/23 0428 12/10/23 0525 12/12/23 0552  WBC 5.3 5.6 6.8 6.4 5.8  HGB 8.3* 8.1* 8.3* 8.8* 8.4*  HCT 25.7* 24.9* 25.7* 27.0* 25.7*  MCV 97.0 97.6 97.0 96.8 97.0  PLT 176 172 195 211 202    Basic Metabolic Panel: Recent Labs  Lab 12/09/23 0428 12/09/23 0931 12/10/23 0525 12/11/23 0421 12/12/23 0552 12/12/23 0558 12/13/23 0433  NA  --  135 135 134* 135  --  134*  K  --  4.3 4.4 3.6 3.9  --  3.7  CL  --  99 98 96* 97*  --  97*  CO2  --  27 27 29 30   --  27  GLUCOSE  --  113* 119* 107* 111*  --  128*  BUN  --  20 21 24* 26*  --  28*  CREATININE  --  1.61*  1.80* 1.77* 1.86*  --  1.90*  CALCIUM   --  8.6* 8.6* 8.6* 8.4*  --  8.6*  MG 1.9  --  2.0 1.9  --  1.8 1.6*    GFR: Estimated Creatinine Clearance: 21.2 mL/min (A) (by C-G formula based on SCr of 1.9 mg/dL (H)).  Liver Function Tests: No results for input(s): "AST", "ALT", "ALKPHOS", "BILITOT", "PROT", "ALBUMIN " in the last 168 hours.  No results for input(s): "LIPASE", "AMYLASE" in the last 168 hours. No results for input(s): "AMMONIA" in the last 168 hours.  Coagulation Profile: No results for input(s): "INR", "PROTIME" in the last 168 hours.  Cardiac Enzymes: No results for input(s): "CKTOTAL", "CKMB", "CKMBINDEX", "TROPONINI" in the last 168 hours.  BNP (last 3 results) Recent Labs    12/04/23 1144  PROBNP 4,071.0*    Lipid Profile: No results for input(s): "CHOL", "HDL", "LDLCALC", "TRIG", "CHOLHDL", "LDLDIRECT" in the last 72 hours.  Thyroid  Function Tests: No results for input(s):  "TSH", "T4TOTAL", "FREET4", "T3FREE", "THYROIDAB" in the last 72 hours.   Anemia Panel: No results for input(s): "VITAMINB12", "FOLATE", "FERRITIN", "TIBC", "IRON", "RETICCTPCT" in the last 72 hours.  Urine analysis:    Component Value Date/Time   COLORURINE YELLOW 12/04/2023 1202   APPEARANCEUR CLEAR 12/04/2023 1202   LABSPEC 1.010 12/04/2023 1202   PHURINE 8.0 12/04/2023 1202   GLUCOSEU NEGATIVE 12/04/2023 1202   HGBUR NEGATIVE 12/04/2023 1202   BILIRUBINUR NEGATIVE 12/04/2023 1202   BILIRUBINUR negative 06/04/2016 0847   BILIRUBINUR neg 02/14/2015 1431   KETONESUR 15 (A) 12/04/2023 1202   PROTEINUR TRACE (A) 12/04/2023 1202   UROBILINOGEN 0.2 06/04/2016 0847   NITRITE NEGATIVE 12/04/2023 1202   LEUKOCYTESUR NEGATIVE 12/04/2023 1202    Sepsis Labs: Lactic Acid, Venous    Component Value Date/Time   LATICACIDVEN 1.4 08/08/2023 2150    MICROBIOLOGY: Recent Results (from the past 240 hours)  Urine Culture     Status: Abnormal   Collection Time: 12/04/23 12:02 PM   Specimen: Urine, Clean Catch  Result Value Ref Range Status   Specimen Description   Final    URINE, CLEAN CATCH Performed at [facility], [address], [place], [place] [address]    Special Requests   Final    NONE Performed at [facility], [address] [name], [place] [address]    Culture (A)  Final    <10,000 COLONIES/mL INSIGNIFICANT GROWTH Performed at [facility], [address], [place], [place] [address]    Report Status 12/05/2023 FINAL  Final  MRSA Next Gen by PCR, Nasal     Status: None   Collection Time: 12/04/23  6:52 PM   Specimen: Nasal Mucosa; Nasal Swab  Result Value Ref Range Status   MRSA by PCR Next Gen NOT DETECTED NOT DETECTED Final    Comment: (NOTE) The GeneXpert MRSA Assay (FDA approved for NASAL specimens only), is one component of a comprehensive MRSA colonization surveillance program. It is not intended to diagnose  MRSA infection nor to guide or monitor treatment for MRSA infections. Test performance is not FDA approved in patients less than 56 years old. Performed at [facility], [address], [place], [place] [address]   Culture, blood (Routine X 2) w Reflex to ID Panel     Status: None   Collection Time: 12/04/23  7:03 PM   Specimen: BLOOD  Result Value Ref Range Status   Specimen Description BLOOD SITE NOT SPECIFIED  Final   Special  Requests   Final    BOTTLES DRAWN AEROBIC ONLY Blood Culture results may not be optimal due to an inadequate volume of blood received in culture bottles   Culture   Final    NO GROWTH 5 DAYS Performed at [facility], [address], [place], [place] [address]    Report Status 12/09/2023 FINAL  Final  Culture, blood (Routine X 2) w Reflex to ID Panel     Status: None   Collection Time: 12/04/23  7:11 PM   Specimen: BLOOD  Result Value Ref Range Status   Specimen Description BLOOD SITE NOT SPECIFIED  Final   Special Requests   Final    BOTTLES DRAWN AEROBIC ONLY Blood Culture results may not be optimal due to an inadequate volume of blood received in culture bottles   Culture   Final    NO GROWTH 5 DAYS Performed at [facility], [address] [place], [place] [address]    Report Status 12/09/2023 FINAL  Final  Body fluid culture w Gram Stain     Status: None   Collection Time: 12/05/23  2:59 PM   Specimen: Pleural Fluid  Result Value Ref Range Status   Specimen Description PLEURAL  Final   Special Requests NONE  Final   Gram Stain NO WBC SEEN NO ORGANISMS SEEN   Final   Culture   Final    NO GROWTH 3 DAYS Performed at [facility], [address] [place], [place] [address]    Report Status 12/09/2023 FINAL  Final    RADIOLOGY STUDIES/RESULTS: DG CHEST PORT 1 VIEW Result Date: 12/12/2023 CLINICAL DATA:  [id number] Pneumothorax [id number] EXAM: PORTABLE CHEST - 1 VIEW COMPARISON:  12/11/2023 FINDINGS: Stable pigtail catheter  medially at the left lung base, without definite pneumothorax. Lungs are clear. Stable left subclavian AICD. Heart size and mediastinal contours are within normal limits. Aortic Atherosclerosis (ICD10-170.0). No effusion. Visualized bones unremarkable. IMPRESSION: Stable left basilar pigtail catheter without definite pneumothorax. Electronically Signed   By: Nicoletta Barrier M.D.   On: 12/12/2023 13:09   DG Chest Port 1V same Day Result Date: 12/11/2023 CLINICAL DATA:  83 year old female status post pleural catheter placement 4 effusion, left hydropneumothorax. EXAM: PORTABLE CHEST 1 VIEW COMPARISON:  Chest CT 12/04/2024. Portable chest yesterday and earlier. FINDINGS: Portable AP semi upright view at 0834 hours. Pigtail left lung base pleural catheter is stable. Substantially improved left hydropneumothorax since 12/05/2023. Since yesterday the residual left apical pneumothorax is less apparent. Pleural edge could be obscured by left chest AICD. Stable cardiac size and mediastinal contours. No mediastinal shift. No confluent lung opacity or areas of worsening ventilation. Visualized tracheal air column is within normal limits. Stable visualized osseous structures. Paucity of bowel gas. IMPRESSION: Stable left pleural catheter. Suspected small residual left pneumothorax, otherwise normal ventilation. Electronically Signed   By: Marlise Simpers M.D.   On: 12/11/2023 11:32     LOS: 9 days   Signature  -    Lynnwood Sauer M.D on 12/13/2023 at 8:13 AM   -  To page go to [email or web address]

## 2023-12-13 NOTE — Progress Notes (Signed)
 NAME:  Lisa [name], MRN:  [id number], DOB:  1941-05-25, LOS: 9 ADMISSION DATE:  12/04/2023, CONSULTATION DATE:  12/04/2023 REFERRING MD:  Dr. Rufina Cough - [facility], CHIEF COMPLAINT:  Pleural effusion    History of Present Illness:  Lisa [name] is a 83 y.o. female with a past medical history significant for CHF, CAD, arrhythmias including V. tach, hypertension,, CML, and melanoma who presented to the ED [facility] at [facility]/26 for complaints of progressive right-sided flank and back pain that began 4 to 5 days prior to admission.  Of note patient underwent thoracentesis per interventional radiology 2 days prior to admission for large pleural effusion, 1500 mL were removed.  Appears patient had ex vacuo pneumo post thoracentesis.  I do not see in our system to see that any samples/cytology was obtained during thoracentesis.  She also had an urgent care visit at [facility] on 4/24 for similar complaints of back pain, flank pain and nausea where she was diagnosed with E. coli UTI and started on Keflex .    Patient was admitted per hospitalist with pulmonary critical care consulted for management of hydro pneumothorax On arrival to [facility] patient went into repeated V. tach with firing of her implanted ICD.  She was transferred to ICU, started on amiodarone  infusion  Pertinent  Medical History  CHF, CAD, arrhythmias including V. tach, hypertension,, CML, and melanoma  Significant Hospital Events: Including procedures, antibiotic start and stop dates in addition to other pertinent events   4/26 presented with progressive right sided flank pain with nausea and decreased oral intake.  CT renal study with large left hydropneumothorax.  Developed V. tach arrest and transferred to ICU for amiodarone  drip and cardiology consult. 4/27 cardiac condition is stabilized.  Left chest tube placed 4/29 transferred out of ICU 4/30 chest tube placed to suction 5/3 chest tube with air leak present, small  apical pneumothorax  Interim History / Subjective:   No complaints No acute events Remains on suction  Objective   Blood pressure (!) 126/51, pulse 72, temperature 98.2 F (36.8 C), temperature source Oral, resp. rate 19, height 5\' 1"  (1.549 m), weight 72.7 kg, last menstrual period 02/14/1995, SpO2 96%.        Intake/Output Summary (Last 24 hours) at 12/13/2023 1648 Last data filed at 12/13/2023 0550 Gross per 24 hour  Intake 370 ml  Output 375 ml  Net -5 ml   Filed Weights   12/08/23 0500 12/09/23 0500 12/13/23 0437  Weight: 77.7 kg 73.6 kg 72.7 kg   Examination: Gen:      No acute distress, elderly HEENT:  sclera anicteric Neck:     No masses; no thyromegaly Lungs:    Clear to auscultation bilaterally; normal respiratory effort. Chest tube with small intermittent air leak CV:         Regular rate and rhythm; no murmurs Abd:      + bowel sounds; soft, non-tender; no palpable masses, no distension Ext:    No edema; adequate peripheral perfusion Skin:      Warm and dry; no rash Neuro:   alert and oriented x 3  CXR 5/3 - Small apical pneumothorax CXR 5/4 - No pneumothorax noted CT 5/5  - Pleural effusion essentially resolved. Trapped lung in several loculated areas.  Resolved Hospital Problem list     Assessment & Plan:   Pneumothorax  Pleural Effusion - pleural fluid 4/27 exudate by protein on Lights, likely pseudo, transudate by LDH - s/p pleural lytics  P:  - CT showing trapped lung - Place tube to water -seal - CXR in AM, if unchanged will clamp.  - If imaging or clinically worse will resume suction and may need to consider CT surgery consult.  - Discussed with patient  Best Practice (right click and "Reselect all SmartList Selections" daily)  Per primary team- [facility]  Signature:    Roz Cornelia, AGACNP-BC [facility]  See Amion for personal pager PCCM on call pager [phone] until 7pm. Please call Elink 7p-7a.  [phone]  12/13/2023 5:06 PM

## 2023-12-13 NOTE — Progress Notes (Signed)
 Suction to chest tube discontinued at this time per MD

## 2023-12-13 NOTE — Progress Notes (Signed)
 Physical Therapy Treatment Patient Details Name: Lisa [name] MRN: [id number] DOB: Dec 20, 1940 Today's Date: 12/13/2023   History of Present Illness Pt is an 83 year old woman admitted 12/04/23  with R sided flank pain and nausea. Found Lt hydropneumothorax. Chest tube placed 4/27. Pt with V tach and ICD firing during admission requiring brief ICU stay for amiodarone  infusion. Of note, pt had thoracentesis 2 days prior.  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    PT Comments  Tolerated well today, eager to get out of bed and mobilize. Gait up to 100 feet with rollator at a supervision level, and additional bout up to 75 feet. SpO2 91% on RA with activity, improved to 96% sitting down at end of session. Reviewed LE exercises without issues. Patient will continue to benefit from skilled physical therapy services to further improve independence with functional mobility.     If plan is discharge home, recommend the following: Assistance with cooking/housework;Help with stairs or ramp for entrance   Can travel by private vehicle        Equipment Recommendations  None recommended by PT    Recommendations for Other Services       Precautions / Restrictions Precautions Precautions: Fall;Other (comment) Recall of Precautions/Restrictions: Intact Precaution/Restrictions Comments: chest tube Restrictions Weight Bearing Restrictions Per Provider Order: No     Mobility  Bed Mobility Overal bed mobility: Needs Assistance Bed Mobility: Supine to Sit     Supine to sit: Supervision     General bed mobility comments: Superivison, assisted with lines/leads only, extra time.    Transfers Overall transfer level: Needs assistance Equipment used: Rollator (4 wheels) Transfers: Sit to/from Stand Sit to Stand: Supervision           General transfer comment: Supervision for safety, performed from bed and standard  chair without arm rests. Cues to recall checking breaks are locked on rollator. No physical assist needed.    Ambulation/Gait Ambulation/Gait assistance: Supervision Gait Distance (Feet): 100 Feet (+75) Assistive device: Rollator (4 wheels) Gait Pattern/deviations: Step-through pattern, Decreased stride length Gait velocity: decreased Gait velocity interpretation: <1.31 ft/sec, indicative of household ambulator   General Gait Details: Completed distance of 100 feet with one brief standing rest break, SpO2 91% on RA. Seated rest break increased to 93%; able to complete second bout of 75 feet. Cues for symptom awareness, pacing, and energy conservation.   Stairs             Wheelchair Mobility     Tilt Bed    Modified Rankin (Stroke Patients Only)       Balance Overall balance assessment: Needs assistance Sitting-balance support: No upper extremity supported, Feet supported Sitting balance-Leahy Scale: Fair     Standing balance support: Single extremity supported, No upper extremity supported Standing balance-Leahy Scale: Fair                              Hotel manager: No apparent difficulties  Cognition Arousal: Alert Behavior During Therapy: WFL for tasks assessed/performed   PT - Cognitive impairments: No apparent impairments                         Following commands: Intact      Cueing Cueing Techniques: Verbal cues, Tactile cues  Exercises General Exercises - Lower Extremity Ankle Circles/Pumps: AROM, Both, 15 reps,  Seated Gluteal Sets: Strengthening, Both, 10 reps, Seated Long Arc Quad: Strengthening, Both, 10 reps, Seated    General Comments        Pertinent Vitals/Pain Pain Assessment Pain Assessment: Faces Faces Pain Scale: Hurts a little bit Pain Location: chest tube insertion site when couging Pain Descriptors / Indicators: Sore Pain Intervention(s): Monitored during session    Home  Living                          Prior Function            PT Goals (current goals can now be found in the care plan section) Acute Rehab PT Goals Patient Stated Goal: return home, address back pain PT Goal Formulation: With patient Time For Goal Achievement: 12/21/23 Potential to Achieve Goals: Good Progress towards PT goals: Progressing toward goals    Frequency    Min 2X/week      PT Plan      Co-evaluation              AM-PAC PT "6 Clicks" Mobility   Outcome Measure  Help needed turning from your back to your side while in a flat bed without using bedrails?: None Help needed moving from lying on your back to sitting on the side of a flat bed without using bedrails?: A Little Help needed moving to and from a bed to a chair (including a wheelchair)?: A Little Help needed standing up from a chair using your arms (e.g., wheelchair or bedside chair)?: A Little Help needed to walk in hospital room?: A Little Help needed climbing 3-5 steps with a railing? : A Little 6 Click Score: 19    End of Session Equipment Utilized During Treatment: Gait belt Activity Tolerance: Patient tolerated treatment well Patient left: with call bell/phone within reach;in chair;with chair alarm set;with nursing/sitter in room Nurse Communication: Mobility status PT Visit Diagnosis: Pain;Difficulty in walking, not elsewhere classified (R26.2);Muscle weakness (generalized) (M62.81) Pain - Right/Left: Right Pain - part of body:  (back)     Time: 1610-9604 PT Time Calculation (min) (ACUTE ONLY): 23 min  Charges:    $Gait Training: 8-22 mins $Therapeutic Activity: 8-22 mins PT General Charges $$ ACUTE PT VISIT: 1 Visit                     Jory Ng, PT, DPT [facility]  Rehabilitation Services Physical Therapist Office: [phone] Website: [email or web address]    Alinda Irani 12/13/2023, 9:52 AM

## 2023-12-13 NOTE — Plan of Care (Signed)
   Problem: Education: Goal: Knowledge of General Education information will improve Description Including pain rating scale, medication(s)/side effects and non-pharmacologic comfort measures Outcome: Progressing   Problem: Clinical Measurements: Goal: Will remain free from infection Outcome: Progressing  Goal: Diagnostic test results will improve Outcome: Progressing Goal: Respiratory complications will improve Outcome: Progressing Goal: Cardiovascular complication will be avoided Outcome: Progressing   Problem: Activity: Goal: Risk for activity intolerance will decrease Outcome: Progressing

## 2023-12-14 ENCOUNTER — Encounter: Payer: Self-pay | Admitting: Internal Medicine

## 2023-12-14 ENCOUNTER — Inpatient Hospital Stay (HOSPITAL_COMMUNITY)

## 2023-12-14 ENCOUNTER — Telehealth: Payer: Self-pay | Admitting: Internal Medicine

## 2023-12-14 DIAGNOSIS — J9383 Other pneumothorax: Secondary | ICD-10-CM | POA: Diagnosis not present

## 2023-12-14 DIAGNOSIS — J948 Other specified pleural conditions: Secondary | ICD-10-CM | POA: Diagnosis not present

## 2023-12-14 LAB — BASIC METABOLIC PANEL WITH GFR
Anion gap: 7 (ref 5–15)
BUN: 25 mg/dL — ABNORMAL HIGH (ref 8–23)
CO2: 28 mmol/L (ref 22–32)
Calcium: 8.3 mg/dL — ABNORMAL LOW (ref 8.9–10.3)
Chloride: 99 mmol/L (ref 98–111)
Creatinine, Ser: 1.84 mg/dL — ABNORMAL HIGH (ref 0.44–1.00)
GFR, Estimated: 27 mL/min — ABNORMAL LOW (ref >60)
Glucose, Bld: 103 mg/dL — ABNORMAL HIGH (ref 70–99)
Potassium: 4.1 mmol/L (ref 3.5–5.1)
Sodium: 134 mmol/L — ABNORMAL LOW (ref 135–145)

## 2023-12-14 LAB — MAGNESIUM: Magnesium: 2.5 mg/dL — ABNORMAL HIGH (ref 1.7–2.4)

## 2023-12-14 LAB — UREA NITROGEN, URINE: Urea Nitrogen, Ur: 386 mg/dL

## 2023-12-14 NOTE — Plan of Care (Signed)
  Problem: Education: Goal: Knowledge of General Education information will improve Description: Including pain rating scale, medication(s)/side effects and non-pharmacologic comfort measures Outcome: Progressing   Problem: Clinical Measurements: Goal: Will remain free from infection Outcome: Progressing Goal: Diagnostic test results will improve Outcome: Progressing Goal: Respiratory complications will improve Outcome: Progressing Goal: Cardiovascular complication will be avoided Outcome: Progressing   Problem: Activity: Goal: Risk for activity intolerance will decrease Outcome: Progressing   Problem: Nutrition: Goal: Adequate nutrition will be maintained Outcome: Progressing   Problem: Coping: Goal: Level of anxiety will decrease Outcome: Progressing

## 2023-12-14 NOTE — Progress Notes (Signed)
   12/14/23 1130  Mobility  Activity Ambulated with assistance in hallway  Level of Assistance Standby assist, set-up cues, supervision of patient - no hands on  Assistive Device Four wheel walker  Distance Ambulated (ft) 200 ft  Activity Response Tolerated fair  Mobility Referral Yes  Mobility visit 1 Mobility  Mobility Specialist Start Time (ACUTE ONLY) 1130  Mobility Specialist Stop Time (ACUTE ONLY) 1159  Mobility Specialist Time Calculation (min) (ACUTE ONLY) 29 min   Mobility Specialist: Progress Note  Pre-Mobility:      HR 87, SpO2 98% RA During Mobility: HR 108-113, SpO2 97% RA Post-Mobility:    HR 94, SpO2 97% RA Pt agreeable to mobility session - received in bed. C/o L sided pain when coughing where chest tube was removed. Returned to chair with all needs met - call bell within reach. Chair alarm on.   Isla Mari, BS Mobility Specialist Please contact via SecureChat or  Rehab office at [phone].

## 2023-12-14 NOTE — Progress Notes (Signed)
 Occupational Therapy Treatment Patient Details Name: Lisa [name] MRN: [id number] DOB: [birth year] Today's Date: 12/14/2023   History of present illness Pt is an 83 year old woman admitted 12/04/23  with R sided flank pain and nausea. Found Lt hydropneumothorax. Chest tube placed 4/27. Pt with V tach and ICD firing during admission requiring brief ICU stay for amiodarone  infusion. Of note, pt had thoracentesis 2 days prior.  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   OT comments  Pt making steady progress towards OT goals, requesting bathroom assist on entry. Pt able to manage [facility] transfers using walker with Supervision, as well as toileting hygiene in standing. No LOB or safety concerns but pt does endorse endurance deficits. Provided UE HEP handouts for AROM and resistance bands per pt request. DC recs remain appropriate.      If plan is discharge home, recommend the following:  Assistance with cooking/housework;Assist for transportation;A little help with bathing/dressing/bathroom   Equipment Recommendations  None recommended by OT    Recommendations for Other Services      Precautions / Restrictions Precautions Precautions: Fall;Other (comment) Recall of Precautions/Restrictions: Intact Precaution/Restrictions Comments: chest tube Restrictions Weight Bearing Restrictions Per Provider Order: No       Mobility Bed Mobility Overal bed mobility: Needs Assistance Bed Mobility: Supine to Sit     Supine to sit: Supervision     General bed mobility comments: increased time/effort, assist for lines    Transfers Overall transfer level: Needs assistance Equipment used: Rolling walker (2 wheels) Transfers: Sit to/from Stand, Bed to chair/wheelchair/BSC Sit to Stand: Supervision     Step pivot transfers: Supervision     General transfer comment: > BSC and > recliner afterwards, no LOB and assist for  lines     Balance Overall balance assessment: Needs assistance Sitting-balance support: No upper extremity supported, Feet supported Sitting balance-Leahy Scale: Fair     Standing balance support: Single extremity supported, No upper extremity supported Standing balance-Leahy Scale: Fair                             ADL either performed or assessed with clinical judgement   ADL Overall ADL's : Needs assistance/impaired                     Lower Body Dressing: Minimal assistance;Sit to/from stand;Sitting/lateral leans Lower Body Dressing Details (indicate cue type and reason): sock assist - wears crocs at baseline Toilet Transfer: Supervision/safety;Stand-pivot;BSC/3in1;Rolling walker (2 wheels) Toilet Transfer Details (indicate cue type and reason): slow pacing but no assist needed Toileting- Clothing Manipulation and Hygiene: Supervision/safety;Sitting/lateral lean;Sit to/from stand Toileting - Clothing Manipulation Details (indicate cue type and reason): able to complete hygiene in standing with setup       General ADL Comments: Provided UE HEP AROM + resistance bands per pt request    Extremity/Trunk Assessment Upper Extremity Assessment Upper Extremity Assessment: Overall WFL for tasks assessed;Right hand dominant   Lower Extremity Assessment Lower Extremity Assessment: Defer to PT evaluation        Vision   Vision Assessment?: No apparent visual deficits   Perception     Praxis     Communication Communication Communication: No apparent difficulties   Cognition Arousal: Alert Behavior During Therapy: WFL for tasks assessed/performed Cognition: No apparent impairments  Following commands: Intact        Cueing   Cueing Techniques: Verbal cues, Tactile cues  Exercises      Shoulder Instructions       General Comments VSS on RA    Pertinent Vitals/ Pain       Pain Assessment Pain  Assessment: No/denies pain  Home Living                                          Prior Functioning/Environment              Frequency  Min 2X/week        Progress Toward Goals  OT Goals(current goals can now be found in the care plan section)  Progress towards OT goals: Progressing toward goals  Acute Rehab OT Goals Patient Stated Goal: walk more OT Goal Formulation: With patient Time For Goal Achievement: 12/21/23 Potential to Achieve Goals: Good ADL Goals Pt Will Perform Lower Body Bathing: with modified independence;sit to/from stand;sitting/lateral leans Pt Will Perform Lower Body Dressing: with modified independence;sitting/lateral leans;sit to/from stand Pt Will Transfer to Toilet: with modified independence;ambulating  Plan      Co-evaluation                 AM-PAC OT "6 Clicks" Daily Activity     Outcome Measure   Help from another person eating meals?: None Help from another person taking care of personal grooming?: A Little Help from another person toileting, which includes using toliet, bedpan, or urinal?: A Little Help from another person bathing (including washing, rinsing, drying)?: A Little Help from another person to put on and taking off regular upper body clothing?: A Little Help from another person to put on and taking off regular lower body clothing?: A Little 6 Click Score: 19    End of Session Equipment Utilized During Treatment: Rolling walker (2 wheels)  OT Visit Diagnosis: Unsteadiness on feet (R26.81);Other abnormalities of gait and mobility (R26.89);Muscle weakness (generalized) (M62.81)   Activity Tolerance Patient tolerated treatment well   Patient Left in chair;with call bell/phone within reach;with chair alarm set   Nurse Communication Mobility status        Time: 1610-9604 OT Time Calculation (min): 24 min  Charges: OT General Charges $OT Visit: 1 Visit OT Treatments $Self Care/Home Management  : 8-22 mins  [name], OTR/L Acute Rehab Services Office: [phone]   [name] 12/14/2023, 8:43 AM

## 2023-12-14 NOTE — Progress Notes (Signed)
 PROGRESS NOTE        PATIENT DETAILS Name: Lisa [name] Age: 83 y.o. Sex: female Date of Birth: October 20, 1940 Admit Date: 12/04/2023 Admitting Physician Johnetta Nab, MD ZOX:[id number], Dossie Gayer, DO  Brief Summary: Patient is a 83 y.o.  female with history of prior cardiac arrest, V. tach/V-fib-s/p ICD placement, HTN, chronic mild systolic/diastolic heart failure, CML, hypothyroidism-who had a IR paracentesis on 4/25-presented to the hospital on 4/26 with right flank/back pain-she was subsequently found to have a large left hydropneumothorax-requiring chest tube placement-she was also found to have V. tach with repeated firing of her implanted ICD-she was started on amiodarone  infusion and subsequently transferred to the ICU.  She was stabilized and transferred back to [facility] on 4/28  Significant events: 4/25>> thoracocentesis by IR-1.45 L removed. 4/26>> admit to [facility]-hydropneumothorax 4/26>> V. tach-ICD firing-amiodarone  infusion-transfer to ICU 4/27>> CT chest inserted by CCM 4/28>> transferred to [facility]  Significant studies: 4/26>> CT abdomen/pelvis: Large left hydropneumothorax. 4/27>> CT chest: Large left-sided hydropneumothorax with fluid component greater than 2 L.  Significant microbiology data: 4/26>> culture<10,000 colonies/mL insignificant growth 4/26>> blood culture: No growth 8/27>> pleural fluid culture: No growth  Procedures: 4/27>> CT chest inserted by CCM  Consults: PCCM Cardiology/EP  Subjective: Patient in bed, appears comfortable, denies any headache, no fever, no chest pain or pressure, no shortness of breath , no abdominal pain. No new focal weakness.   Objective: Vitals: Blood pressure (!) 125/49, pulse 76, temperature 98.2 F (36.8 C), temperature source Oral, resp. rate 16, height 5\' 1"  (1.549 m), weight 78.7 kg, last menstrual period 02/14/1995, SpO2 99%.   Exam:  Awake Alert, No new F.N deficits, Normal  affect [place].AT,PERRAL Supple Neck, No JVD,   Symmetrical Chest wall movement, left-sided chest tube in place, good air movement bilaterally RRR,No Gallops, Rubs or new Murmurs,  +ve B.Sounds, Abd Soft, No tenderness,   chronic bilateral lower extremity lymphedema    Assessment/Plan:  Recurrent ventricular tachycardia ICD in place No further V. tach Paced rhythm Attempt to keep keep K> 4, Mg> 2-will K accordingly today.  No longer on IV amiodarone -has been switched to oral amiodarone .  Patient aware of driving restriction  Pneumothorax ex vacuo S/p left-sided chest tube placement Suspected trapped lung likely due to inflammation/scarring from prolonged effusion. Chest tube placed back to suction on 4/30-given lytic therapy on 12/09/23. Await further recommendations from PCCM. CT chest  12/13/23 - Interval decrease in the size of the left pneumothorax and near complete resolution of left pleural fluid.    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 Cytology negative this admit-prior cytologies x 3 negative as well.   Chronic combined systolic/diastolic heart failure (EF 40-45% with grade 1 diastolic dysfunction on 4/27) Euvolemic on exam-but does have chronic lymphedema at baseline. Continue oral diuretics    HLD Statin  Constipation Finally had a BM after several days on 5/2-required a Dulcolax suppository Continue MiraLAX /senna.    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 Follow CBC periodically  Hypothyroidism [place] Thyroid   CKD stage IIIb Mild AKI on 12/13/2023 due to nausea vomiting on 12/12/2023, gentle IV fluids and skip diuretics on 12/13/2023 and monitor.  Chronic venous stasis changes in bilateral lower extremities/lymphedema Diuretics Unna  boots ordered  5/2.  Debility/deconditioning PT/OT eval appreciated-Home health planned on discharge. Spoke with daughter on 4/30-previously has refused SNF-has refused to work with PT/OT in the house.  Apparently lives in unhygienic conditions-hoards goods.  Appreciate case manager talking to family on 4/30-note reviewed.  Class 1 Obesity: Estimated body mass index is 32.78 kg/m as calculated from the following:   Height as of this encounter: 5\' 1"  (1.549 m).   Weight as of this encounter: 78.7 kg.   Code status:   Code Status: Full Code   DVT Prophylaxis: enoxaparin  (LOVENOX ) injection 30 mg Start: 12/10/23 1000   Family Communication: Daughter-Shannon-[phone] updated over the phone on 4/30.   Disposition Plan: Status is: Inpatient Remains inpatient appropriate because: Severity of illness   Planned Discharge Destination:Home   Diet: Diet Order             Diet Heart Room service appropriate? Yes; Fluid consistency: Thin; Fluid restriction: 1800 mL Fluid  Diet effective now                     Antimicrobial agents: Anti-infectives (From admission, onward)    Start     Dose/Rate Route Frequency Ordered Stop   12/05/23 1400  cefTRIAXone  (ROCEPHIN ) 2 g in sodium chloride  0.9 % 100 mL IVPB        2 g 200 mL/hr over 30 Minutes Intravenous Every 24 hours 12/05/23 1312 12/07/23 1615   12/04/23 1732  cephALEXin  (KEFLEX ) capsule 500 mg  Status:  Discontinued        500 mg Oral Every 12 hours 12/04/23 1741 12/05/23 1312        MEDICATIONS: Scheduled Meds:  amiodarone   200 mg Oral BID   atorvastatin   20 mg Oral Daily   Chlorhexidine  Gluconate Cloth  6 each Topical Daily   enoxaparin  (LOVENOX ) injection  30 mg Subcutaneous Q24H   magnesium  oxide  400 mg Oral Daily   polyethylene glycol  17 g Oral BID   potassium chloride   40 mEq Oral Daily   senna  2 tablet Oral Daily   sodium chloride  flush  10 mL Intrapleural Q8H   thyroid   15 mg Oral QAC breakfast   torsemide   40 mg  Oral Daily   Continuous Infusions:    PRN Meds:.acetaminophen  **OR** acetaminophen , alum & mag hydroxide-simeth, bisacodyl , morphine  injection, nitroGLYCERIN , ondansetron , mouth rinse, prochlorperazine    I have personally reviewed following labs and imaging studies  LABORATORY DATA: CBC: Recent Labs  Lab 12/08/23 0424 12/09/23 0428 12/10/23 0525 12/12/23 0552  WBC 5.6 6.8 6.4 5.8  HGB 8.1* 8.3* 8.8* 8.4*  HCT 24.9* 25.7* 27.0* 25.7*  MCV 97.6 97.0 96.8 97.0  PLT 172 195 211 202    Basic Metabolic Panel: Recent Labs  Lab 12/10/23 0525 12/11/23 0421 12/12/23 0552 12/12/23 0558 12/13/23 0433 12/14/23 0426  NA 135 134* 135  --  134* 134*  K 4.4 3.6 3.9  --  3.7 4.1  CL 98 96* 97*  --  97* 99  CO2 27 29 30   --  27 28  GLUCOSE 119* 107* 111*  --  128* 103*  BUN 21 24* 26*  --  28* 25*  CREATININE 1.80* 1.77* 1.86*  --  1.90* 1.84*  CALCIUM  8.6* 8.6* 8.4*  --  8.6* 8.3*  MG 2.0 1.9  --  1.8 1.6* 2.5*    GFR: Estimated Creatinine Clearance: 22.8 mL/min (A) (by C-G formula based on SCr of 1.84 mg/dL (H)).  Liver  Function Tests: No results for input(s): "AST", "ALT", "ALKPHOS", "BILITOT", "PROT", "ALBUMIN " in the last 168 hours.  No results for input(s): "LIPASE", "AMYLASE" in the last 168 hours. No results for input(s): "AMMONIA" in the last 168 hours.  Coagulation Profile: No results for input(s): "INR", "PROTIME" in the last 168 hours.  Cardiac Enzymes: No results for input(s): "CKTOTAL", "CKMB", "CKMBINDEX", "TROPONINI" in the last 168 hours.  BNP (last 3 results) Recent Labs    12/04/23 1144  PROBNP 4,071.0*    Lipid Profile: No results for input(s): "CHOL", "HDL", "LDLCALC", "TRIG", "CHOLHDL", "LDLDIRECT" in the last 72 hours.  Thyroid  Function Tests: No results for input(s): "TSH", "T4TOTAL", "FREET4", "T3FREE", "THYROIDAB" in the last 72 hours.   Anemia Panel: No results for input(s): "VITAMINB12", "FOLATE", "FERRITIN", "TIBC", "IRON",  "RETICCTPCT" in the last 72 hours.  Urine analysis:    Component Value Date/Time   COLORURINE YELLOW 12/04/2023 1202   APPEARANCEUR CLEAR 12/04/2023 1202   LABSPEC 1.010 12/04/2023 1202   PHURINE 8.0 12/04/2023 1202   GLUCOSEU NEGATIVE 12/04/2023 1202   HGBUR NEGATIVE 12/04/2023 1202   BILIRUBINUR NEGATIVE 12/04/2023 1202   BILIRUBINUR negative 06/04/2016 0847   BILIRUBINUR neg 02/14/2015 1431   KETONESUR 15 (A) 12/04/2023 1202   PROTEINUR TRACE (A) 12/04/2023 1202   UROBILINOGEN 0.2 06/04/2016 0847   NITRITE NEGATIVE 12/04/2023 1202   LEUKOCYTESUR NEGATIVE 12/04/2023 1202    Sepsis Labs: Lactic Acid, Venous    Component Value Date/Time   LATICACIDVEN 1.4 08/08/2023 2150    MICROBIOLOGY: Recent Results (from the past 240 hours)  Urine Culture     Status: Abnormal   Collection Time: 12/04/23 12:02 PM   Specimen: Urine, Clean Catch  Result Value Ref Range Status   Specimen Description   Final    URINE, CLEAN CATCH Performed at [facility], [address] [place], [place] [address]    Special Requests   Final    NONE Performed at [facility], [address] [place], [place] [address]    Culture (A)  Final    <10,000 COLONIES/mL INSIGNIFICANT GROWTH Performed at [facility], [address] [place], [place] [address]    Report Status 12/05/2023 FINAL  Final  MRSA Next Gen by PCR, Nasal     Status: None   Collection Time: 12/04/23  6:52 PM   Specimen: Nasal Mucosa; Nasal Swab  Result Value Ref Range Status   MRSA by PCR Next Gen NOT DETECTED NOT DETECTED Final    Comment: (NOTE) The GeneXpert MRSA Assay (FDA approved for NASAL specimens only), is one component of a comprehensive MRSA colonization surveillance program. It is not intended to diagnose MRSA infection nor to guide or monitor treatment for MRSA infections. Test performance is not FDA approved in patients less than 57 years old. Performed at [facility], [address] [place], [place] [address]   Culture, blood (Routine X 2) w Reflex to ID Panel     Status: None   Collection Time: 12/04/23  7:03 PM   Specimen: BLOOD  Result Value Ref Range Status   Specimen Description BLOOD SITE NOT SPECIFIED  Final   Special Requests   Final    BOTTLES DRAWN AEROBIC ONLY Blood Culture results may not be optimal due to an inadequate volume of blood received in culture bottles   Culture   Final    NO GROWTH 5 DAYS Performed at [facility], [address], [place], [place] [address]  Report Status 12/09/2023 FINAL  Final  Culture, blood (Routine X 2) w Reflex to ID Panel     Status: None   Collection Time: 12/04/23  7:11 PM   Specimen: BLOOD  Result Value Ref Range Status   Specimen Description BLOOD SITE NOT SPECIFIED  Final   Special Requests   Final    BOTTLES DRAWN AEROBIC ONLY Blood Culture results may not be optimal due to an inadequate volume of blood received in culture bottles   Culture   Final    NO GROWTH 5 DAYS Performed at [facility], [address], [place], [place] [address]    Report Status 12/09/2023 FINAL  Final  Body fluid culture w Gram Stain     Status: None   Collection Time: 12/05/23  2:59 PM   Specimen: Pleural Fluid  Result Value Ref Range Status   Specimen Description PLEURAL  Final   Special Requests NONE  Final   Gram Stain NO WBC SEEN NO ORGANISMS SEEN   Final   Culture   Final    NO GROWTH 3 DAYS Performed at [facility], [address], [place], [place] [address]    Report Status 12/09/2023 FINAL  Final    RADIOLOGY STUDIES/RESULTS:  CT CHEST WO CONTRAST Result Date: 12/13/2023 CLINICAL DATA:  Pneumothorax.  Persistent air leak right chest tube. EXAM: CT CHEST WITHOUT CONTRAST TECHNIQUE: Multidetector CT imaging of the chest was performed following the standard protocol without IV contrast. RADIATION DOSE REDUCTION: This exam was performed according to the departmental  dose-optimization program which includes automated exposure control, adjustment of the mA and/or kV according to patient size and/or use of iterative reconstruction technique. COMPARISON:  Chest CT dated 12/05/2023. FINDINGS: Evaluation of this exam is limited in the absence of intravenous contrast. Cardiovascular: There is no cardiomegaly or pericardial effusion. There is coronary vascular calcification or stent of the LAD. Left pectoral pacemaker device. Moderate atherosclerotic calcification of the thoracic aorta. No aneurysmal dilatation. The central pulmonary arteries are grossly unremarkable. Mediastinum/Nodes: No hilar or mediastinal adenopathy. The esophagus is grossly unremarkable. No mediastinal fluid collection. Lungs/Pleura: Interval decrease in the size of the left pneumothorax. Significant decrease in the size of the fluid content in the left pleural space. Minimal residual fluid noted. Left-sided chest tube with tip in the medial left lung base pleural surface. Areas of atelectasis in the left lung. The right lung is clear. The central airways are patent. Upper Abdomen: No acute abnormality. Musculoskeletal: Bilateral breast implants. Degenerative changes of the spine and osteopenia. No acute osseous pathology. IMPRESSION: 1. Interval decrease in the size of the left pneumothorax and near complete resolution of left pleural fluid. 2. Left-sided chest tube with tip in the medial left lung base pleural surface. 3.  Aortic Atherosclerosis (ICD10-I70.0). Electronically Signed   By: Angus Bark M.D.   On: 12/13/2023 10:40   DG CHEST PORT 1 VIEW Result Date: 12/12/2023 CLINICAL DATA:  [id number] Pneumothorax [id number] EXAM: PORTABLE CHEST - 1 VIEW COMPARISON:  12/11/2023 FINDINGS: Stable pigtail catheter medially at the left lung base, without definite pneumothorax. Lungs are clear. Stable left subclavian AICD. Heart size and mediastinal contours are within normal limits. Aortic Atherosclerosis  (ICD10-170.0). No effusion. Visualized bones unremarkable. IMPRESSION: Stable left basilar pigtail catheter without definite pneumothorax. Electronically Signed   By: Nicoletta Barrier M.D.   On: 12/12/2023 13:09     LOS: 10 days   Signature  -    Lynnwood Sauer M.D on  12/14/2023 at 7:11 AM   -  To page go to [email or web address]

## 2023-12-14 NOTE — Telephone Encounter (Signed)
 Patient stated she wants a call back from Dr. Carolynne Citron or his nurse to discuss her defibrillator.

## 2023-12-14 NOTE — Plan of Care (Signed)

## 2023-12-14 NOTE — Progress Notes (Signed)
 Chest tube removed at this time by Dr [name]

## 2023-12-14 NOTE — Progress Notes (Signed)
 NAME:  Lisa [name], MRN:  [id number], DOB:  January 18, 1941, LOS: 10 ADMISSION DATE:  12/04/2023, CONSULTATION DATE:  12/04/2023 REFERRING MD:  Dr. Rufina Cough - [facility], CHIEF COMPLAINT:  Pleural effusion    History of Present Illness:  Lisa [name] is a 83 y.o. female with a past medical history significant for CHF, CAD, arrhythmias including V. tach, hypertension,, CML, and melanoma who presented to the ED ED at [facility] 4/26 for complaints of progressive right-sided flank and back pain that began 4 to 5 days prior to admission.  Of note patient underwent thoracentesis per interventional radiology 2 days prior to admission for large pleural effusion, 1500 mL were removed.  Appears patient had ex vacuo pneumo post thoracentesis.  I do not see in our system to see that any samples/cytology was obtained during thoracentesis.  She also had an urgent care visit at [facility] on 4/24 for similar complaints of back pain, flank pain and nausea where she was diagnosed with E. coli UTI and started on Keflex .    Patient was admitted per hospitalist with pulmonary critical care consulted for management of hydro pneumothorax On arrival to [facility] patient went into repeated V. tach with firing of her implanted ICD.  She was transferred to ICU, started on amiodarone  infusion  Pertinent  Medical History  CHF, CAD, arrhythmias including V. tach, hypertension,, CML, and melanoma  Significant Hospital Events: Including procedures, antibiotic start and stop dates in addition to other pertinent events   4/26 presented with progressive right sided flank pain with nausea and decreased oral intake.  CT renal study with large left hydropneumothorax.  Developed V. tach arrest and transferred to ICU for amiodarone  drip and cardiology consult. 4/27 cardiac condition is stabilized.  Left chest tube placed 4/29 transferred out of ICU 4/30 chest tube placed to suction 5/3 chest tube with air leak present, small  apical pneumothorax 5 6 chest tube without airleak currently on waterseal  Interim History / Subjective:  Currently on room air Chest tube drainage is decreasing daily  Objective   Blood pressure (!) 131/57, pulse 80, temperature (!) 97.5 F (36.4 C), temperature source Oral, resp. rate 18, height 5\' 1"  (1.549 m), weight 78.7 kg, last menstrual period 02/14/1995, SpO2 98%.        Intake/Output Summary (Last 24 hours) at 12/14/2023 0900 Last data filed at 12/14/2023 0630 Gross per 24 hour  Intake 250 ml  Output 110 ml  Net 140 ml   Filed Weights   12/09/23 0500 12/13/23 0437 12/14/23 0429  Weight: 73.6 kg 72.7 kg 78.7 kg   Examination: Awake alert sitting in chair in no acute distress Currently on room air with sats of 98% Diminished breath sounds in the bases left chest tube is in place without airleak with serous fluid 165 cc over the last 24 hours Heart sounds are regular Is obese soft nontender Remedies with 2+ edema pitting  CXR 5/3 - Small apical pneumothorax CXR 5/4 - No pneumothorax noted CT 5/5  - Pleural effusion essentially resolved. Trapped lung in several loculated areas. Chest x-ray 12/14/2023 with no overt pneumothorax with decreased pleural effusion  Resolved Hospital Problem list     Assessment & Plan:   Pneumothorax  Pleural Effusion - pleural fluid 4/27 exudate by protein on Lights, likely pseudo, transudate by LDH - s/p pleural lytics P:  Status post thoracentesis 12/03/2023, chest tube insertion 12/05/2023 by PCCM Chest tube drainage over the last 24 hours 160 cc the patient  is on waterseal Once chest tube drainage is under 100 cc we will clamp and pursue decannulation within 24 hours if chest x-ray remains clear Pneumothoraxes resolved per CT scan on 12/13/2023 Currently on room air Follow daily with chest x-ray  Ventricular tachycardia per cardiology ICD per cardiology   Best Practice (right click and "Reselect all SmartList Selections" daily)   Per primary team- [facility]  Signature:    Siegfried Dress Khale Nigh ACNP Acute Care Nurse Practitioner Jonny Neu Pulmonary/Critical Care Please consult Amion 12/14/2023, 9:00 AM

## 2023-12-14 NOTE — Telephone Encounter (Signed)
 I called and spoke with pt and gave her Dr. Tristan Furlough recommendation (I called Cody Das and she called Dr. Carolynne Citron) He would like for her to go home and recover from her hospital stay. She is at a high risk for infection if he does her procedure now.   She was made aware of her f/u in the office with Mertha Abrahams on 5/22 and I informed her that Leighton Punches would make a decision on that day if she was able to keep her procedure scheduled on 5/30 as it is.   She was appreciative for the call back.

## 2023-12-15 ENCOUNTER — Encounter: Payer: Self-pay | Admitting: Internal Medicine

## 2023-12-15 ENCOUNTER — Inpatient Hospital Stay (HOSPITAL_COMMUNITY)

## 2023-12-15 ENCOUNTER — Ambulatory Visit (HOSPITAL_BASED_OUTPATIENT_CLINIC_OR_DEPARTMENT_OTHER): Admitting: Physical Therapy

## 2023-12-15 DIAGNOSIS — J948 Other specified pleural conditions: Secondary | ICD-10-CM | POA: Diagnosis not present

## 2023-12-15 LAB — BASIC METABOLIC PANEL WITH GFR
Anion gap: 7 (ref 5–15)
BUN: 23 mg/dL (ref 8–23)
CO2: 27 mmol/L (ref 22–32)
Calcium: 8.3 mg/dL — ABNORMAL LOW (ref 8.9–10.3)
Chloride: 101 mmol/L (ref 98–111)
Creatinine, Ser: 1.79 mg/dL — ABNORMAL HIGH (ref 0.44–1.00)
GFR, Estimated: 28 mL/min — ABNORMAL LOW (ref >60)
Glucose, Bld: 108 mg/dL — ABNORMAL HIGH (ref 70–99)
Potassium: 4.1 mmol/L (ref 3.5–5.1)
Sodium: 135 mmol/L (ref 135–145)

## 2023-12-15 LAB — MAGNESIUM: Magnesium: 2 mg/dL (ref 1.7–2.4)

## 2023-12-15 MED ORDER — DICLOFENAC SODIUM 1 % EX GEL
2.0000 g | Freq: Three times a day (TID) | CUTANEOUS | Status: DC
  Administered 2023-12-15 – 2023-12-16 (×4): 2 g via TOPICAL
  Filled 2023-12-15: qty 100

## 2023-12-15 NOTE — Progress Notes (Signed)
 Physical Therapy Treatment Patient Details Name: Lisa [name] MRN: [id number] DOB: [birth year] Today's Date: 12/15/2023   History of Present Illness 83 y.o. woman admitted 12/04/23 with R flank pain and nausea; workup revealed L hydropneumothorax. Chest tube placed 4/27. Pt with V tach and ICD firing during admission requiring brief ICU stay for amiodarone  infusion. Of note, pt had thoracentesis 2 days prior.  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   PT Comments  Pt able to perform mobility and self-care tasks at supervision, though limited by onset of fatigue and dizziness after urgent bouts of diarrhea; BP down to 117/66 after using BSC, increasing to 138/64 with seated rest and legs elevated. Pt reports likely plan for d/c home tomorrow via medical transport; increased time problem solving through safe mobility strategies at home; pt reports no further questions or concerns. If to remain admitted, will continue to follow acutely to address established goals.      If plan is discharge home, recommend the following: Assistance with cooking/housework;Help with stairs or ramp for entrance   Can travel by private vehicle        Equipment Recommendations  None recommended by PT    Recommendations for Other Services       Precautions / Restrictions Precautions Precautions: Fall;Other (comment) Recall of Precautions/Restrictions: Intact Precaution/Restrictions Comments: diarrhea/bowel urgency; c/o dizziness (check orthostatics next session?) Restrictions Weight Bearing Restrictions Per Provider Order: No     Mobility  Bed Mobility               General bed mobility comments: received sitting in recliner    Transfers Overall transfer level: Needs assistance Equipment used: None Transfers: Sit to/from Stand, Bed to chair/wheelchair/BSC Sit to Stand: Supervision   Step pivot transfers: Supervision        General transfer comment: multiple sit<>stands from recliner and BSC; step pivot bed<>BSC without DME, supervision for safety    Ambulation/Gait               General Gait Details: deferred secondary to c/o fatigue and dizziness   Stairs Stairs:  (had planned to perform stair training this session, but deferred secondary to bouts of diarrhea and symptomatic hypotension; pt reports no concern with stairs, plans to have medical transport home)           Wheelchair Mobility     Tilt Bed    Modified Rankin (Stroke Patients Only)       Balance Overall balance assessment: Needs assistance Sitting-balance support: No upper extremity supported, Feet supported Sitting balance-Leahy Scale: Good     Standing balance support: Single extremity supported, No upper extremity supported Standing balance-Leahy Scale: Fair Standing balance comment: can perform multiple bouts of standing pericare without UE support; static and dynamic stability improved with UE support                            Communication Communication Communication: No apparent difficulties  Cognition Arousal: Alert Behavior During Therapy: WFL for tasks assessed/performed   PT - Cognitive impairments: No apparent impairments                         Following commands: Intact      Cueing    Exercises      General Comments General comments (skin integrity, edema, etc.): sitting BP 117/66 (MAP 83) after using BSC with  c/o dizziness; sitting ~4-min w/ legs elevated BP 138/64 (MAP 85). reviewed educ re: role of acute PT, POC, activity recommendations, d/c needs; increased time discussing mobility strategies at home if dizziness after having BM persists - pt reports not abnormal for her, she has a few BSC's and chairs set up around home to use as needed, states she knows to sit asap if onset of lightheadedness occurs      Pertinent Vitals/Pain Pain Assessment Pain Assessment:  Faces Faces Pain Scale: No hurt Pain Intervention(s): Monitored during session    Home Living                          Prior Function            PT Goals (current goals can now be found in the care plan section) Progress towards PT goals: Progressing toward goals    Frequency    Min 2X/week      PT Plan      Co-evaluation              AM-PAC PT "6 Clicks" Mobility   Outcome Measure  Help needed turning from your back to your side while in a flat bed without using bedrails?: None Help needed moving from lying on your back to sitting on the side of a flat bed without using bedrails?: A Little Help needed moving to and from a bed to a chair (including a wheelchair)?: A Little Help needed standing up from a chair using your arms (e.g., wheelchair or bedside chair)?: A Little Help needed to walk in hospital room?: A Little Help needed climbing 3-5 steps with a railing? : A Little 6 Click Score: 19    End of Session   Activity Tolerance: Patient limited by fatigue Patient left: in chair;with call bell/phone within reach;with chair alarm set Nurse Communication: Mobility status PT Visit Diagnosis: Difficulty in walking, not elsewhere classified (R26.2);Muscle weakness (generalized) (M62.81)     Time: 4782-9562 PT Time Calculation (min) (ACUTE ONLY): 24 min  Charges:    $Therapeutic Activity: 8-22 mins $Self Care/Home Management: 8-22 PT General Charges $$ ACUTE PT VISIT: 1 Visit                      Blase Bur, PT, DPT Acute Rehabilitation Services  Personal: Secure Chat Rehab Office: [phone]  Albino Hum 12/15/2023, 4:39 PM

## 2023-12-15 NOTE — Telephone Encounter (Signed)
 See Phone note from 12/14/23

## 2023-12-15 NOTE — Progress Notes (Addendum)
   12/15/23 0907  Mobility  Activity  (Seated BUE Exercises)  Level of Assistance Standby assist, set-up cues, supervision of patient - no hands on  Range of Motion/Exercises Right arm;Left arm;Active  Activity Response Tolerated well  Mobility Referral Yes  Mobility visit 1 Mobility  Mobility Specialist Start Time (ACUTE ONLY) [phone]  Mobility Specialist Stop Time (ACUTE ONLY) 0925  Mobility Specialist Time Calculation (min) (ACUTE ONLY) 18 min   Mobility Specialist: Progress Note  Pre-Mobility:      HR 80, SpO2 100% RA During Mobility: HR 84-98, SpO2 98% RA Post-Mobility:    HR 79, SpO2 100% RA  Pt agreeable to mobility session - received in chair. C/o pain between neck and R shoulder blade. Pt able to wash face using wash cloth with no assistance. Returned to sitting upright in chair with all needs met - call bell within reach. Chair  alarm on.   Seated BUE Exercises ( x15 ): Shoulder Shrugs, Shoulder Flexion/ Extension, Shoulder Rolls, Horizontal Shoulder Adduction, Elbow Flexion/ Extension, Forearm Rotation, Wrist Flexion/ Extension.   Lisa [name], BS Mobility Specialist Please contact via SecureChat or  Rehab office at [phone].

## 2023-12-15 NOTE — Progress Notes (Signed)
 PROGRESS NOTE        PATIENT DETAILS Name: Lisa [name] Age: 83 y.o. Sex: female Date of Birth: 1940/10/15 Admit Date: 12/04/2023 Admitting Physician Lisa Nab, MD FIE:PPIRJ, Lisa Gayer, DO  Brief Summary: Patient is a 83 y.o.  female with history of prior cardiac arrest, V. tach/V-fib-s/p ICD placement, HTN, chronic mild systolic/diastolic heart failure, CML, hypothyroidism-who had a IR paracentesis on 4/25-presented to the hospital on 4/26 with right flank/back pain-she was subsequently found to have a large left hydropneumothorax-requiring chest tube placement-she was also found to have V. tach with repeated firing of her implanted ICD-she was started on amiodarone  infusion and subsequently transferred to the ICU.  She was stabilized and transferred back to [facility] on 4/28  Significant events: 4/25>> thoracocentesis by IR-1.45 L removed. 4/26>> admit to [facility]-hydropneumothorax 4/26>> V. tach-ICD firing-amiodarone  infusion-transfer to ICU 4/27>> CT chest inserted by CCM 4/28>> transferred to [facility]  Significant studies: 4/26>> CT abdomen/pelvis: Large left hydropneumothorax. 4/27>> CT chest: Large left-sided hydropneumothorax with fluid component greater than 2 L.  Significant microbiology data: 4/26>> culture<10,000 colonies/mL insignificant growth 4/26>> blood culture: No growth 8/27>> pleural fluid culture: No growth  Procedures: 4/27>> CT chest inserted by CCM  Consults: PCCM Cardiology/EP  Subjective: Patient in bed, appears comfortable, denies any headache, no fever, no chest pain or pressure, no shortness of breath , no abdominal pain. No focal weakness.  Has been having chronic right-sided back pain in the pelvic area, wants that area to be CT'd.   Objective: Vitals: Blood pressure (!) 136/58, pulse 76, temperature 98.2 F (36.8 C), temperature source Oral, resp. rate 20, height 5\' 1"  (1.549 m), weight 74.7 kg, last menstrual  period 02/14/1995, SpO2 99%.   Exam:  Awake Alert, No new F.N deficits, Normal affect [name].AT,PERRAL Supple Neck, No JVD,   Symmetrical Chest wall movement, left-sided chest tube in place, good air movement bilaterally RRR,No Gallops, Rubs or new Murmurs,  +ve B.Sounds, Abd Soft, No tenderness,   chronic bilateral lower extremity lymphedema    Assessment/Plan:  Recurrent ventricular tachycardia ICD in place No further V. tach Paced rhythm Attempt to keep keep K> 4, Mg> 2-will K accordingly today.  No longer on IV amiodarone -has been switched to oral amiodarone .  Patient aware of driving restriction  Pneumothorax ex vacuo S/p left-sided chest tube placement Suspected trapped lung likely due to inflammation/scarring from prolonged effusion. Chest tube placed back to suction on 4/30-given lytic therapy on 12/09/23. Await further recommendations from PCCM. CT chest  12/13/23 - Interval decrease in the size of the left pneumothorax and near complete resolution of left pleural fluid.  Chest tube removed on 12/14/2023, pulmonary following subsequent x-rays clinically stable according to them.  X-ray discussed with Dr. Felipe [name] pulmonologist on 12/15/2023.  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 Cytology negative this admit-prior cytologies x 3 negative as well.   Chronic combined systolic/diastolic heart failure (EF 40-45% with grade 1 diastolic dysfunction on 4/27) Euvolemic on exam-but does have chronic lymphedema at baseline. Continue oral diuretics    HLD Statin  Constipation Finally had a BM after several days on 5/2-required a Dulcolax suppository Continue MiraLAX /senna.    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 Follow CBC periodically  Hypothyroidism Armour Thyroid   CKD  stage IIIb Mild AKI on 12/13/2023 due to nausea vomiting on 12/12/2023, gentle IV fluids and skip diuretics on 12/13/2023 and monitor.  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  Ongoing chronic right-sided pelvic pain, wants a CT done, says x-rays previously were not helpful.  Will obtain CT of the hip.    Debility/deconditioning PT/OT eval appreciated-Home health planned on discharge. Spoke with daughter on 4/30-previously has refused SNF-has refused to work with PT/OT in the house.  Apparently lives in unhygienic conditions-hoards goods.  Appreciate case manager talking to family on 4/30-note reviewed.  Class 1 Obesity: Estimated body mass index is 31.12 kg/m as calculated from the following:   Height as of this encounter: 5\' 1"  (1.549 m).   Weight as of this encounter: 74.7 kg.   Code status:   Code Status: Full Code   DVT Prophylaxis: enoxaparin  (LOVENOX ) injection 30 mg Start: 12/10/23 1000   Family Communication: Daughter-Lisa [name]-[phone] updated over the phone on 4/30.   Disposition Plan: Status is: Inpatient Remains inpatient appropriate because: Severity of illness   Planned Discharge Destination:Home   Diet: Diet Order             Diet Heart Room service appropriate? Yes; Fluid consistency: Thin; Fluid restriction: 1800 mL Fluid  Diet effective now                     Antimicrobial agents: Anti-infectives (From admission, onward)    Start     Dose/Rate Route Frequency Ordered Stop   12/05/23 1400  cefTRIAXone  (ROCEPHIN ) 2 g in sodium chloride  0.9 % 100 mL IVPB        2 g 200 mL/hr over 30 Minutes Intravenous Every 24 hours 12/05/23 1312 12/07/23 1615   12/04/23 1732  cephALEXin  (KEFLEX ) capsule 500 mg  Status:  Discontinued        500 mg Oral Every 12 hours 12/04/23 1741 12/05/23 1312        MEDICATIONS: Scheduled Meds:  amiodarone   200 mg Oral BID   atorvastatin   20 mg Oral  Daily   Chlorhexidine  Gluconate Cloth  6 each Topical Daily   diclofenac  Sodium  2 g Topical TID AC & HS   enoxaparin  (LOVENOX ) injection  30 mg Subcutaneous Q24H   magnesium  oxide  400 mg Oral Daily   polyethylene glycol  17 g Oral BID   potassium chloride   40 mEq Oral Daily   senna  2 tablet Oral Daily   sodium chloride  flush  10 mL Intrapleural Q8H   thyroid   15 mg Oral QAC breakfast   torsemide   40 mg Oral Daily   Continuous Infusions:    PRN Meds:.acetaminophen  **OR** acetaminophen , alum & mag hydroxide-simeth, bisacodyl , nitroGLYCERIN , ondansetron , mouth rinse, prochlorperazine    I have personally reviewed following labs and imaging studies  LABORATORY DATA: CBC: Recent Labs  Lab 12/09/23 0428 12/10/23 0525 12/12/23 0552  WBC 6.8 6.4 5.8  HGB 8.3* 8.8* 8.4*  HCT 25.7* 27.0* 25.7*  MCV 97.0 96.8 97.0  PLT 195 211 202    Basic Metabolic Panel: Recent Labs  Lab 12/11/23 0421 12/12/23 0552 12/12/23 0558 12/13/23 0433 12/14/23 0426 12/15/23 0422  NA 134* 135  --  134* 134* 135  K 3.6 3.9  --  3.7 4.1 4.1  CL 96* 97*  --  97* 99 101  CO2 29 30  --  27 28 27   GLUCOSE 107* 111*  --  128* 103* 108*  BUN 24* 26*  --  28* 25* 23  CREATININE 1.77* 1.86*  --  1.90* 1.84* 1.79*  CALCIUM  8.6* 8.4*  --  8.6* 8.3* 8.3*  MG 1.9  --  1.8 1.6* 2.5* 2.0    GFR: Estimated Creatinine Clearance: 22.8 mL/min (A) (by C-G formula based on SCr of 1.79 mg/dL (H)).  Liver Function Tests: No results for input(s): "AST", "ALT", "ALKPHOS", "BILITOT", "PROT", "ALBUMIN " in the last 168 hours.  No results for input(s): "LIPASE", "AMYLASE" in the last 168 hours. No results for input(s): "AMMONIA" in the last 168 hours.  Coagulation Profile: No results for input(s): "INR", "PROTIME" in the last 168 hours.  Cardiac Enzymes: No results for input(s): "CKTOTAL", "CKMB", "CKMBINDEX", "TROPONINI" in the last 168 hours.  BNP (last 3 results) Recent Labs    12/04/23 1144  PROBNP  4,071.0*    Urine analysis:    Component Value Date/Time   COLORURINE YELLOW 12/04/2023 1202   APPEARANCEUR CLEAR 12/04/2023 1202   LABSPEC 1.010 12/04/2023 1202   PHURINE 8.0 12/04/2023 1202   GLUCOSEU NEGATIVE 12/04/2023 1202   HGBUR NEGATIVE 12/04/2023 1202   BILIRUBINUR NEGATIVE 12/04/2023 1202   BILIRUBINUR negative 06/04/2016 0847   BILIRUBINUR neg 02/14/2015 1431   KETONESUR 15 (A) 12/04/2023 1202   PROTEINUR TRACE (A) 12/04/2023 1202   UROBILINOGEN 0.2 06/04/2016 0847   NITRITE NEGATIVE 12/04/2023 1202   LEUKOCYTESUR NEGATIVE 12/04/2023 1202    Sepsis Labs: Lactic Acid, Venous    Component Value Date/Time   LATICACIDVEN 1.4 08/08/2023 2150    MICROBIOLOGY: Recent Results (from the past 240 hours)  Body fluid culture w Gram Stain     Status: None   Collection Time: 12/05/23  2:59 PM   Specimen: Pleural Fluid  Result Value Ref Range Status   Specimen Description PLEURAL  Final   Special Requests NONE  Final   Gram Stain NO WBC SEEN NO ORGANISMS SEEN   Final   Culture   Final    NO GROWTH 3 DAYS Performed at [facility], [address], [place], [place] [address]    Report Status 12/09/2023 FINAL  Final    RADIOLOGY STUDIES/RESULTS:  DG CHEST PORT 1 VIEW Result Date: 12/15/2023 CLINICAL DATA:  Encounter for chest tube placement. EXAM: PORTABLE CHEST 1 VIEW COMPARISON:  12/14/2023 FINDINGS: Interval removal of the left-sided pigtail thoracostomy tube. Unchanged appearance of left-sided pneumothorax which measures 3.2 cm above the left apex. On the previous exam this measured 3.5 cm. Left chest wall ICD noted with leads in the right atrial appendage, coronary sinus and right ventricle. Heart size and mediastinal contours are stable. Scar versus atelectasis in the left base. Small left pleural effusion. No airspace opacities. IMPRESSION: 1. Interval removal of left-sided pigtail thoracostomy tube. 2. Unchanged appearance of left-sided pneumothorax. This  measures 3.2 cm above the left apex. 3. Small left pleural effusion. These results will be called to the ordering clinician or representative by the Radiologist Assistant, and communication documented in the PACS or [employer]. Electronically Signed   By: Kimberley Penman M.D.   On: 12/15/2023 06:57   DG Chest Port 1 View Result Date: 12/14/2023 CLINICAL DATA:  Pneumothorax. EXAM: PORTABLE CHEST 1 VIEW COMPARISON:  Dec 12, 2023.  Dec 13, 2023. FINDINGS: The heart size and mediastinal contours are within normal limits. Left-sided defibrillator is unchanged. Left-sided chest tube is unchanged. Mild left apical and basilar pneumothorax is noted. Right lung is clear. The visualized skeletal  structures are unremarkable. IMPRESSION: Stable left-sided chest tube. Mild left apical and basilar pneumothorax is noted. Electronically Signed   By: Rosalene Colon M.D.   On: 12/14/2023 09:02     LOS: 11 days   Signature  -    Lynnwood Sauer M.D on 12/15/2023 at 9:03 AM   -  To page go to [email or web address]

## 2023-12-15 NOTE — Progress Notes (Signed)
   12/15/23 1434  Mobility  Activity Ambulated with assistance in hallway  Level of Assistance Standby assist, set-up cues, supervision of patient - no hands on  Assistive Device Four wheel walker  Distance Ambulated (ft) 100 ft  Activity Response Tolerated fair  Mobility Referral Yes  Mobility visit 1 Mobility  Mobility Specialist Start Time (ACUTE ONLY) 1434  Mobility Specialist Stop Time (ACUTE ONLY) 1458  Mobility Specialist Time Calculation (min) (ACUTE ONLY) 24 min   Mobility Specialist: Progress Note  Pre-Mobility:      HR 80, SpO2 100% RA Post-Mobility:    HR 76   Pt agreeable to mobility session - received in chair - requesting to go back to bed after walk. Pt was asymptomatic throughout session with no complaints. Returned to bed with all needs met - call bell within reach.   Isla Mari, BS Mobility Specialist Please contact via SecureChat or  Rehab office at [phone].

## 2023-12-16 ENCOUNTER — Other Ambulatory Visit (HOSPITAL_COMMUNITY): Payer: Self-pay

## 2023-12-16 ENCOUNTER — Inpatient Hospital Stay (HOSPITAL_COMMUNITY)

## 2023-12-16 ENCOUNTER — Encounter

## 2023-12-16 DIAGNOSIS — J948 Other specified pleural conditions: Secondary | ICD-10-CM | POA: Diagnosis not present

## 2023-12-16 MED ORDER — ASPIRIN 81 MG PO TBEC: 81.0000 mg | DELAYED_RELEASE_TABLET | Freq: Every morning | ORAL | Status: DC

## 2023-12-16 MED ORDER — AMIODARONE HCL 200 MG PO TABS
200.0000 mg | ORAL_TABLET | Freq: Two times a day (BID) | ORAL | Status: DC
Start: 2023-12-16 — End: 2023-12-16
  Administered 2023-12-16: 200 mg via ORAL
  Filled 2023-12-16: qty 1

## 2023-12-16 MED ORDER — AMIODARONE HCL 200 MG PO TABS
200.0000 mg | ORAL_TABLET | Freq: Every day | ORAL | 0 refills | Status: DC
  Filled 2023-12-16: qty 30, 30d supply, fill #0

## 2023-12-16 NOTE — Progress Notes (Signed)
 Pt currently hospitalized.  ICM Remote Transmission rescheduled from 5/8 to 5/27.

## 2023-12-16 NOTE — Discharge Instructions (Addendum)
 No driving x 6 months per [place] guidelines after ICD therapy.   Follow with Primary MD [name], DO in 7 days   Get CBC, CMP, 2 view Chest X ray -  checked next visit with your primary MD    Activity: As tolerated with Full fall precautions use walker/cane & assistance as needed  Disposition Home    Diet: Heart Healthy    Check your Weight same time everyday, if you gain over 2 pounds, or you develop in leg swelling, experience more shortness of breath or chest pain, call your Primary MD immediately. Follow Cardiac Low Salt Diet and 1.5 lit/day fluid restriction.  Special Instructions: If you have smoked or chewed Tobacco  in the last 2 yrs please stop smoking, stop any regular Alcohol  and or any Recreational drug use.  On your next visit with your primary care physician please Get Medicines reviewed and adjusted.  Please request your Prim.MD to go over all Hospital Tests and Procedure/Radiological results at the follow up, please get all Hospital records sent to your Prim MD by signing hospital release before you go home.  If you experience worsening of your admission symptoms, develop shortness of breath, life threatening emergency, suicidal or homicidal thoughts you must seek medical attention immediately by calling 911 or calling your MD immediately  if symptoms less severe.  You Must read complete instructions/literature along with all the possible adverse reactions/side effects for all the Medicines you take and that have been prescribed to you. Take any new Medicines after you have completely understood and accpet all the possible adverse reactions/side effects.   Do not drive when taking Pain medications.  Do not take more than prescribed Pain, Sleep and Anxiety Medications  Wear Seat belts while driving.

## 2023-12-16 NOTE — Discharge Summary (Signed)
 Lisa [name]:[id number] DOB: 12-04-1940 DOA: 12/04/2023  PCP: [name], DO  Admit date: 12/04/2023  Discharge date: 12/16/2023  Admitted From: Home   Disposition:  Home   Recommendations for Outpatient Follow-up:   Follow up with PCP in 1-2 weeks  PCP Please obtain BMP/CBC, 2 view CXR in 1week,  (see Discharge instructions)   PCP Please follow up on the following pending results:    Home Health: PT   Equipment/Devices: None  Consultations: Cards, Pulm Discharge Condition: Stable    CODE STATUS: Full    Diet Recommendation: Heart Healthy 1.5 liters fluid restriction per day    Chief Complaint  Patient presents with   Flank Pain          Brief history of present illness from the day of admission and additional interim summary     83 y.o.  female with history of prior cardiac arrest, V. tach/V-fib-s/p ICD placement, HTN, chronic mild systolic/diastolic heart failure, CML, hypothyroidism-who had a IR paracentesis on 4/25-presented to the hospital on 4/26 with right flank/back pain-she was subsequently found to have a large left hydropneumothorax-requiring chest tube placement-she was also found to have V. tach with repeated firing of her implanted ICD-she was started on amiodarone  infusion and subsequently transferred to the ICU.  She was stabilized and transferred back to [facility] on 4/28   Significant events: 4/25>> thoracocentesis by IR-1.45 L removed. 4/26>> admit to [facility]-hydropneumothorax 4/26>> V. tach-ICD firing-amiodarone  infusion-transfer to ICU 4/27>> CT chest inserted by [facility] 4/28>> transferred to [facility]   Significant studies: 4/26>> CT abdomen/pelvis: Large left hydropneumothorax. 4/27>> CT chest: Large left-sided hydropneumothorax with fluid component greater than 2 L.    Significant microbiology data: 4/26>> culture<10,000 colonies/mL insignificant growth 4/26>> blood culture: No growth 8/27>> pleural fluid culture: No growth   Procedures: 4/27>> CT chest inserted by [facility] Course   Recurrent ventricular tachycardia ICD in place No further V. tach Paced rhythm No longer on IV amiodarone -has been switched to oral amiodarone .  He is discussed with EP team on 12/16/2023. 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 Chest tube placed back to suction on 4/30-given lytic therapy on 12/09/23. Await further recommendations from PCCM. CT chest  12/13/23 - Interval decrease in the size of the left pneumothorax and near complete resolution of left pleural fluid.  Chest tube removed on 12/14/2023, pulmonary following subsequent x-rays clinically stable according to them.  X-ray discussed with Dr. [name] pulmonologist on 12/15/2023 and 12/16/2023, patient has changes consistent with trapped lung, this is chronic, cleared to go  home patient is symptom-free and eager to go home as well.   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 Cytology negative this admit-prior cytologies x 3 negative as well.    Chronic combined systolic/diastolic heart failure (EF 40-45% with grade 1 diastolic dysfunction on 4/27) Euvolemic on exam-but does have chronic lymphedema at baseline. Continue oral diuretics along with fluid restriction, symptom free   HLD Statin   UTI Afebrile/no leukocytosis Rocephin  x 3 doses completed on 4/29   CML On Gleevec -per patient-she reached out to her primary oncologist-who recommended holding Gleevec . 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  Of the right hip was nonacute, requested to follow-up with PCP and primary orthopedics postdischarge.   Debility/deconditioning PT/OT eval appreciated-Home health planned on discharge. Spoke with daughter on 4/30-previously has refused SNF-has refused to work with PT/OT in the house.  Apparently lives in unhygienic conditions-hoards goods.   Home PT if she qualifies.   Class 1 Obesity: Estimated body mass index is 31.12 kg/m follow with PCP  Discharge diagnosis     Principal Problem:   Hydropneumothorax Active Problems:   Ventricular fibrillation (HCC)   Acute on chronic diastolic heart failure (HCC)   Essential hypertension   CML (chronic myelocytic leukemia) (HCC)   Chronic kidney disease, stage III (moderate) (HCC)   Acquired hypothyroidism   Anemia of chronic disease   Obesity, class 1   History of cardiac arrest   ICD (implantable cardioverter-defibrillator) in place   Paroxysmal ventricular tachycardia (HCC)   Trapped lung   Need for management of chest tube    Discharge instructions    Discharge Instructions     Discharge instructions   Complete by: As directed    No driving x 6 months per [place]-DMV guidelines after ICD therapy.   Follow with Primary MD [name], DO in 7 days   Get CBC, CMP, 2 view Chest X ray -  checked next visit with your primary MD    Activity: As tolerated with Full fall precautions use walker/cane & assistance as needed  Disposition Home    Diet: Heart Healthy    Check your Weight same time everyday, if you gain over 2 pounds, or you develop in leg swelling, experience more shortness of breath or chest pain, call your Primary MD immediately. Follow Cardiac Low Salt Diet and 1.5 lit/day  fluid restriction.  Special Instructions: If you have smoked or chewed Tobacco  in the last 2 yrs please stop smoking, stop any regular Alcohol  and or any Recreational drug use.  On your next visit with your primary care physician please Get Medicines reviewed and adjusted.  Please request your Prim.MD to go over all Hospital Tests and Procedure/Radiological results at the follow up, please get all Hospital records sent to your Prim MD by signing hospital release before you go home.  If you experience worsening of your admission symptoms, develop shortness of breath, life threatening emergency, suicidal or homicidal thoughts you must seek medical attention immediately by calling 911 or calling your MD immediately  if symptoms less severe.  You Must read complete instructions/literature along with all the possible adverse reactions/side effects for all the Medicines you take and that  have been prescribed to you. Take any new Medicines after you have completely understood and accpet all the possible adverse reactions/side effects.   Do not drive when taking Pain medications.  Do not take more than prescribed Pain, Sleep and Anxiety Medications  Wear Seat belts while driving.   Increase activity slowly   Complete by: As directed    No wound care   Complete by: As directed        Discharge Medications   Allergies as of 12/16/2023       Reactions   Flagyl [metronidazole] Itching, Rash, Other (See Comments)   Welts, also   Penicillins Hives, Itching, Rash   Previously tolerated ancef  + rocephin    Sulfamethoxazole Rash   Dilaudid  [hydromorphone  Hcl] Nausea And Vomiting   Meperidine Nausea And Vomiting   Propoxyphene    Other reaction(s): Unknown   Tape Other (See Comments)   "THE PLASTIC, CLEAR TAPE CAN/DOES PULL OFF MY SKIN."   Doxycycline  Nausea Only, Rash, Other (See Comments)   Made her "feel terrible"   Lanolin Itching, Rash   Sulfa Antibiotics Rash        Medication List      STOP taking these medications    cephALEXin  500 MG capsule Commonly known as: KEFLEX        TAKE these medications    acetaminophen  500 MG tablet Commonly known as: TYLENOL  Take 1,000 mg by mouth every 6 (six) hours as needed for moderate pain (pain score 4-6).   allopurinol  100 MG tablet Commonly known as: ZYLOPRIM  Take 100 mg by mouth daily.   aluminum hydroxide-magnesium  carbonate 95-358 MG/15ML Susp Commonly known as: GAVISCON Take 30 mLs by mouth as needed for indigestion or heartburn.   amiodarone  200 MG tablet Commonly known as: PACERONE  Take 1 tablet (200 mg total) by mouth at bedtime.   ascorbic acid  500 MG tablet Commonly known as: VITAMIN C  Take 500 mg by mouth daily.   aspirin  EC 81 MG tablet Take 81 mg by mouth in the morning.   atorvastatin  20 MG tablet Commonly known as: LIPITOR Take 1 tablet (20 mg total) by mouth daily.   b complex vitamins capsule Take 1 capsule by mouth daily.   calcium  carbonate 1250 (500 Ca) MG tablet Commonly known as: OS-CAL - dosed in mg of elemental calcium  Take 1 tablet by mouth daily as needed (Calcium  levels).   imatinib  400 MG tablet Commonly known as: GLEEVEC  Take 400 mg by mouth at bedtime.   MAGNESIUM  GLYCINATE PO Take 1 tablet by mouth at bedtime.   multivitamin with minerals Tabs tablet Take 1 tablet by mouth daily.   nitroGLYCERIN  0.4 MG SL tablet Commonly known as: NITROSTAT  Place 1 tablet (0.4 mg total) under the tongue every 5 (five) minutes x 3 doses as needed for chest pain.   thyroid  15 MG tablet Commonly known as: ARMOUR Take 15 mg by mouth daily before breakfast.   torsemide  20 MG tablet Commonly known as: DEMADEX  Take 1 tablet (20 mg total) by mouth daily. What changed:  when to take this additional instructions   VITAMIN B12 SL Place 1 tablet under the tongue daily.   VITAMIN D -3 PO Take 5,000 Units by mouth daily.         Follow-up Information     [name], DO. Schedule an appointment as soon as possible for a visit in 1 week(s).   Specialty: Internal Medicine Contact information: [address] [place] [address] [place] [address] [phone]  Camnitz, Will Gaylyn Keas, MD. Schedule an appointment as soon as possible for a visit in 1 week(s).   Specialty: Cardiology Contact information: [address] [place] [address] [phone]                 Major procedures and Radiology Reports - PLEASE review detailed and final reports thoroughly  -       CT HIP RIGHT WO CONTRAST Result Date: 12/15/2023 CLINICAL DATA:  None chronic right hip pain.  Dysplasia suspected. EXAM: CT OF THE RIGHT HIP WITHOUT CONTRAST TECHNIQUE: Multidetector CT imaging of the right hip was performed according to the standard protocol. Multiplanar CT image reconstructions were also generated. RADIATION DOSE REDUCTION: This exam was performed according to the departmental dose-optimization program which includes automated exposure control, adjustment of the mA and/or kV according to patient size and/or use of iterative reconstruction technique. COMPARISON:  CT abdomen pelvis 12/04/2023, left hip radiographs 11/25/2015 FINDINGS: Bones/Joint/Cartilage Mildly decreased bone mineralization. Moderate to severe pubic symphysis joint space narrowing with subchondral cystic change again seen at the posterior aspect and moderate-to-large spur extending superiorly and medially from the superior aspect of the right pubic body approximately 14 mm (coronal series 8 images 53-59). There is partially visualized degenerative osseous fusion of the posterior aspect of the right sacroiliac joint, as completely visualized on recent 12/04/2023 CT. Mild right sacroiliac subchondral sclerosis and degenerative vacuum phenomenon. Moderate superolateral right femoroacetabular joint space narrowing. There is normal coverage of the superior right femoral head without evidence of  acetabular dysplasia. Mild-to-moderate superolateral right acetabular degenerative osteophytosis, similar to prior. Ligaments Suboptimally assessed by CT. Muscles and Tendons Normal size and density of the regional musculature. No tendon tear is seen. Soft tissues The uterus is surgically absent. Mild high-grade atherosclerotic calcifications. IMPRESSION: 1. 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 No evidence of acetabular dysplasia. 2. 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 3. 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 Electronically Signed   By: Bertina Broccoli M.D.   On: 12/15/2023 11:17   DG CHEST PORT 1 VIEW Result Date: 12/15/2023 CLINICAL DATA:  Encounter for chest tube placement. EXAM: PORTABLE CHEST 1 VIEW COMPARISON:  12/14/2023 FINDINGS: Interval removal of the left-sided pigtail thoracostomy tube. Unchanged appearance of left-sided pneumothorax which measures 3.2 cm above the left apex. On the previous exam this measured 3.5 cm. Left chest wall ICD noted with leads in the right atrial appendage, coronary sinus and right ventricle. Heart size and mediastinal contours are stable. Scar versus atelectasis in the left base. Small left pleural effusion. No airspace opacities. IMPRESSION: 1. Interval removal of left-sided pigtail thoracostomy tube. 2. Unchanged appearance of left-sided pneumothorax. This measures 3.2 cm above the left apex. 3. Small left pleural effusion. These results will be called to the ordering clinician or representative by the Radiologist Assistant, and communication documented in the PACS or [employer]. Electronically Signed   By: Kimberley Penman M.D.   On: 12/15/2023 06:57   DG Chest Port 1 View Result Date: 12/14/2023 CLINICAL DATA:  Pneumothorax. EXAM: PORTABLE CHEST 1 VIEW COMPARISON:  Dec 12, 2023.  Dec 13, 2023. FINDINGS: The heart size and mediastinal contours are within normal limits.  Left-sided defibrillator is unchanged. Left-sided chest tube is unchanged. Mild left apical and basilar pneumothorax is noted. Right lung is clear. The visualized skeletal structures are unremarkable. IMPRESSION: Stable left-sided chest tube. Mild left apical and basilar pneumothorax is noted. Electronically Signed   By: Rosalene Colon  M.D.   On: 12/14/2023 09:02   CT CHEST WO CONTRAST Result Date: 12/13/2023 CLINICAL DATA:  Pneumothorax.  Persistent air leak right chest tube. EXAM: CT CHEST WITHOUT CONTRAST TECHNIQUE: Multidetector CT imaging of the chest was performed following the standard protocol without IV contrast. RADIATION DOSE REDUCTION: This exam was performed according to the departmental dose-optimization program which includes automated exposure control, adjustment of the mA and/or kV according to patient size and/or use of iterative reconstruction technique. COMPARISON:  Chest CT dated 12/05/2023. FINDINGS: Evaluation of this exam is limited in the absence of intravenous contrast. Cardiovascular: There is no cardiomegaly or pericardial effusion. There is coronary vascular calcification or stent of the LAD. Left pectoral pacemaker device. Moderate atherosclerotic calcification of the thoracic aorta. No aneurysmal dilatation. The central pulmonary arteries are grossly unremarkable. Mediastinum/Nodes: No hilar or mediastinal adenopathy. The esophagus is grossly unremarkable. No mediastinal fluid collection. Lungs/Pleura: Interval decrease in the size of the left pneumothorax. Significant decrease in the size of the fluid content in the left pleural space. Minimal residual fluid noted. Left-sided chest tube with tip in the medial left lung base pleural surface. Areas of atelectasis in the left lung. The right lung is clear. The central airways are patent. Upper Abdomen: No acute abnormality. Musculoskeletal: Bilateral breast implants. Degenerative changes of the spine and osteopenia. No acute osseous  pathology. IMPRESSION: 1. Interval decrease in the size of the left pneumothorax and near complete resolution of left pleural fluid. 2. Left-sided chest tube with tip in the medial left lung base pleural surface. 3.  Aortic Atherosclerosis (ICD10-I70.0). Electronically Signed   By: Angus Bark M.D.   On: 12/13/2023 10:40   DG CHEST PORT 1 VIEW Result Date: 12/12/2023 CLINICAL DATA:  [id number] Pneumothorax [id number] EXAM: PORTABLE CHEST - 1 VIEW COMPARISON:  12/11/2023 FINDINGS: Stable pigtail catheter medially at the left lung base, without definite pneumothorax. Lungs are clear. Stable left subclavian AICD. Heart size and mediastinal contours are within normal limits. Aortic Atherosclerosis (ICD10-170.0). No effusion. Visualized bones unremarkable. IMPRESSION: Stable left basilar pigtail catheter without definite pneumothorax. Electronically Signed   By: Nicoletta Barrier M.D.   On: 12/12/2023 13:09   DG Chest Port 1V same Day Result Date: 12/11/2023 CLINICAL DATA:  83 year old female status post pleural catheter placement 4 effusion, left hydropneumothorax. EXAM: PORTABLE CHEST 1 VIEW COMPARISON:  Chest CT 12/04/2024. Portable chest yesterday and earlier. FINDINGS: Portable AP semi upright view at 0834 hours. Pigtail left lung base pleural catheter is stable. Substantially improved left hydropneumothorax since 12/05/2023. Since yesterday the residual left apical pneumothorax is less apparent. Pleural edge could be obscured by left chest AICD. Stable cardiac size and mediastinal contours. No mediastinal shift. No confluent lung opacity or areas of worsening ventilation. Visualized tracheal air column is within normal limits. Stable visualized osseous structures. Paucity of bowel gas. IMPRESSION: Stable left pleural catheter. Suspected small residual left pneumothorax, otherwise normal ventilation. Electronically Signed   By: Marlise Simpers M.D.   On: 12/11/2023 11:32   DG CHEST PORT 1 VIEW Result Date: 12/10/2023 CLINICAL  DATA:  Pneumothorax. EXAM: PORTABLE CHEST 1 VIEW COMPARISON:  One-view chest x-ray 12/09/2023 FINDINGS: The heart is enlarged. Atherosclerotic changes are present at the aortic arch. Pacing and defibrillator wires are noted. Aeration is improved. A small left apical pneumothorax is stable. The left basilar pneumothorax is no longer visualized. IMPRESSION: 1. Stable small left apical pneumothorax. 2. Interval resolution of left basilar pneumothorax. 3. Improved aeration bilaterally. Electronically Signed   By: Veryl Gottron  Mattern M.D.   On: 12/10/2023 11:49   DG CHEST PORT 1 VIEW Result Date: 12/09/2023 CLINICAL DATA:  Follow-up pneumothorax EXAM: PORTABLE CHEST 1 VIEW COMPARISON:  Film from the previous day. FINDINGS: Pigtail catheter is again noted on the left. Reduction in left-sided pleural effusion is noted. Left-sided pneumothorax is noted with slight increase in basilar component given the interval decrease in pleural effusion. Left basilar atelectasis is noted. Right lung is clear. Defibrillator is again noted. IMPRESSION: Decrease in left-sided pleural effusion with pneumothorax on the left. Electronically Signed   By: Violeta Grey M.D.   On: 12/09/2023 09:31   DG Chest Port 1V same Day Result Date: 12/08/2023 CLINICAL DATA:  [id number] Pneumohemothorax [id number] for EXAM: PORTABLE CHEST 1 VIEW COMPARISON:  None available. FINDINGS: Left chest pacemaker/AICD with leads terminating in the right atrium, right ventricle, and coronary sinus. Lower lung volumes. Bronchovascular crowding. Moderate-sized left apical pneumothorax measuring 4 cm (previously 3.7 cm). No cardiomegaly. Left-sided small bore thoracostomy tube similarly positioned in the medial left lung apex. Small amount of left pleural fluid. No acute fracture or destructive lesion. Multilevel thoracic osteophytosis. IMPRESSION: Similar size of the left hydropneumothorax measuring 4 cm at the lung apex (previously, 3.8 cm). Small bore left-sided  thoracostomy tube is also similarly positioned in the medial left lung apex. Electronically Signed   By: Rance Burrows M.D.   On: 12/08/2023 09:13   DG Chest Port 1 View Result Date: 12/07/2023 CLINICAL DATA:  Pleural effusion EXAM: PORTABLE CHEST 1 VIEW COMPARISON:  12/07/2023 FINDINGS: Single frontal view of the chest demonstrates stable pigtail drainage catheter overlying the medial left lung base. Stable left hydropneumothorax. No acute airspace disease. The cardiac silhouette is stable. Multi lead pacer/AICD unchanged. IMPRESSION: 1. Stable left hydropneumothorax. Left pigtail drainage catheter unchanged in position. Electronically Signed   By: Bobbye Burrow M.D.   On: 12/07/2023 18:52   DG Chest Port 1 View Result Date: 12/07/2023 CLINICAL DATA:  Follow-up left pleural effusion EXAM: PORTABLE CHEST 1 VIEW COMPARISON:  12/06/2023 FINDINGS: Pigtail catheter is again noted on the left. Persistent hydropneumothorax is seen on the left stable from the prior study. Defibrillator is again seen. Cardiac shadow is stable. Lungs are otherwise clear. IMPRESSION: Persistent left hydropneumothorax stable from the prior study. Electronically Signed   By: Violeta Grey M.D.   On: 12/07/2023 08:49   DG Chest Port 1 View Result Date: 12/06/2023 CLINICAL DATA:  Follow up pleural effusion. EXAM: PORTABLE CHEST 1 VIEW COMPARISON:  Radiographs 12/05/2023 and 12/04/2023.  CT 12/05/2023 FINDINGS: 1039 hours. Left subclavian AICD leads are unchanged in position. Small caliber left pleural catheter is unchanged in position, projecting over the left hemidiaphragm. Possible mild re-accumulation of pleural fluid at the costophrenic angle on the left. The volume of the left apical pneumothorax has not significantly changed. No mediastinal shift or focal airspace disease. Bilateral breast implants are noted. IMPRESSION: Similar size of left-sided hydropneumothorax with decreased air and possibly mildly increased fluid in the  pleural space compared with yesterday. Left chest tube remains in place. Electronically Signed   By: Elmon Hagedorn M.D.   On: 12/06/2023 14:31   DG CHEST PORT 1 VIEW Result Date: 12/05/2023 CLINICAL DATA:  Follow up pneumothorax EXAM: PORTABLE CHEST 1 VIEW COMPARISON:  12/05/2023, 3:05 p.m. FINDINGS: Left basilar chest tube in place. Persistent pneumothorax is noted partially loculated at the base. There is a decrease in the pleural separation at the apex. Cardiac silhouette is enlarged. No pleural effusions. Calcified aorta.  Left-sided pacer. IMPRESSION: Left-sided pneumothorax may be slightly smaller compared to the prior study. Electronically Signed   By: Sydell Eva M.D.   On: 12/05/2023 21:43   DG CHEST PORT 1 VIEW Result Date: 12/05/2023 CLINICAL DATA:  Status post chest tube placement EXAM: PORTABLE CHEST 1 VIEW COMPARISON:  CT of earlier today.  Plain film of yesterday. FINDINGS: Pacer/ICD. Normal heart size. Atherosclerosis in the transverse aorta. Placement of a left-sided pigtail pleural catheter which terminates inferiorly and medially. The moderate left sided pneumothorax is felt to be similar, given differences in technique. Apical component measures 3.8 cm and inferolateral component measures maximally 3.5 cm. Patchy left sided atelectasis. Nonspecific mild interstitial thickening/coarsening. IMPRESSION: Placement of a left-sided pigtail pleural catheter, with similar moderate (30%) left pneumothorax. Aortic Atherosclerosis (ICD10-I70.0). Electronically Signed   By: Lore Rode M.D.   On: 12/05/2023 17:48   CT CHEST WO CONTRAST Result Date: 12/05/2023 CLINICAL DATA:  Pleural effusion, pneumothorax EXAM: CT CHEST WITHOUT CONTRAST TECHNIQUE: Multidetector CT imaging of the chest was performed following the standard protocol without IV contrast. RADIATION DOSE REDUCTION: This exam was performed according to the departmental dose-optimization program which includes automated exposure  control, adjustment of the mA and/or kV according to patient size and/or use of iterative reconstruction technique. COMPARISON:  12/04/2023, 08/08/2023 FINDINGS: Cardiovascular: Unenhanced imaging of the heart is unremarkable without pericardial effusion. Multi lead pacemaker again identified. Normal caliber of the thoracic aorta. Atherosclerosis of the aorta and coronary vasculature. Assessment of the vascular lumen cannot be performed without IV contrast. Mediastinum/Nodes: No enlarged mediastinal or axillary lymph nodes. Thyroid  gland, trachea, and esophagus demonstrate no significant findings. Lungs/Pleura: A large left-sided hydropneumothorax is again identified, unchanged since recent x-ray and CT. Volume of fluid estimated greater than 2 L. Dense consolidation and volume loss within the lingula and left lower lobe compatible with atelectasis. No tension effect or mediastinal shift. Trace free-flowing right pleural effusion. Minimal dependent right lower lobe atelectasis. No right-sided pneumothorax. The central airways are patent. Upper Abdomen: Diffuse high attenuation of the liver parenchyma consistent with history of amiodarone  therapy. High attenuation material within the gallbladder may reflect sludge. Musculoskeletal: No acute or destructive bony abnormalities. Diffuse thoracic spondylosis with flowing anterior osteophytes. Reconstructed images demonstrate no additional findings. IMPRESSION: 1. Large left-sided hydropneumothorax, with fluid component estimated greater than 2 L, not appreciably changed since prior imaging. Dense consolidation volume loss within the lingula and left lower lobe compatible with atelectasis, and may reflect underlying trapped lung. No tension effect or mediastinal shift. 2. Trace free-flowing right pleural effusion and minimal dependent right lower lobe atelectasis. 3. Increased attenuation of liver parenchyma consistent with history of amiodarone  therapy. 4. Gallbladder  sludge.  No evidence of cholecystitis. 5. Aortic Atherosclerosis (ICD10-I70.0). Coronary artery atherosclerosis. Electronically Signed   By: Bobbye Burrow M.D.   On: 12/05/2023 15:14   ECHOCARDIOGRAM COMPLETE Result Date: 12/05/2023    ECHOCARDIOGRAM REPORT   Patient Name:   Lisa [name] Date of Exam: 12/05/2023 Medical Rec #:  [id number]          Height:       61.0 in Accession #:    [id number]         Weight:       169.0 lb Date of Birth:  [birth year]           BSA:          1.758 m Patient Age:    83 years  BP:           133/62 mmHg Patient Gender: F                  HR:           67 bpm. Exam Location:  Inpatient Procedure: 2D Echo, Cardiac Doppler, Color Doppler and Intracardiac            Opacification Agent (Both Spectral and Color Flow Doppler were            utilized during procedure). Indications:    Ventricular Tachycardia I47.2  History:        Patient has prior history of Echocardiogram examinations, most                 recent 08/09/2023. CHF, Angina, Defibrillator, CKD, stage 3,                 Arrythmias:Atrial Fibrillation, Ventricular Fibrillation, PVC                 and Tachycardia; Risk Factors:Hypertension.  Sonographer:    Terrilee Few RCS Referring Phys: [id number] PRAVEEN MANNAM IMPRESSIONS  1. Left ventricular ejection fraction, by estimation, is 40 to 45%. The left ventricle has mildly decreased function. The left ventricle has no regional wall motion abnormalities. Left ventricular diastolic parameters are consistent with Grade I diastolic dysfunction (impaired relaxation).  2. Right ventricular systolic function is normal. The right ventricular size is normal.  3. The mitral valve is normal in structure. No evidence of mitral valve regurgitation. No evidence of mitral stenosis.  4. The aortic valve is normal in structure. Aortic valve regurgitation is not visualized. No aortic stenosis is present.  5. The inferior vena cava is normal in size with greater than 50%  respiratory variability, suggesting right atrial pressure of 3 mmHg. Comparison(s): No significant change from prior study. Prior images reviewed side by side. FINDINGS  Left Ventricle: Left ventricular ejection fraction, by estimation, is 40 to 45%. The left ventricle has mildly decreased function. The left ventricle has no regional wall motion abnormalities. Definity  contrast agent was given IV to delineate the left ventricular endocardial borders. The left ventricular internal cavity size was normal in size. There is no left ventricular hypertrophy. Left ventricular diastolic parameters are consistent with Grade I diastolic dysfunction (impaired relaxation).  LV Wall Scoring: The posterior wall is akinetic. Right Ventricle: The right ventricular size is normal. No increase in right ventricular wall thickness. Right ventricular systolic function is normal. Left Atrium: Left atrial size was normal in size. Right Atrium: Right atrial size was normal in size. Pericardium: There is no evidence of pericardial effusion. Mitral Valve: The mitral valve is normal in structure. No evidence of mitral valve regurgitation. No evidence of mitral valve stenosis. Tricuspid Valve: The tricuspid valve is normal in structure. Tricuspid valve regurgitation is not demonstrated. No evidence of tricuspid stenosis. Aortic Valve: The aortic valve is normal in structure. Aortic valve regurgitation is not visualized. No aortic stenosis is present. Aortic valve peak gradient measures 7.3 mmHg. Pulmonic Valve: The pulmonic valve was normal in structure. Pulmonic valve regurgitation is not visualized. No evidence of pulmonic stenosis. Aorta: The aortic root is normal in size and structure. Venous: The inferior vena cava is normal in size with greater than 50% respiratory variability, suggesting right atrial pressure of 3 mmHg. IAS/Shunts: No atrial level shunt detected by color flow Doppler. Additional Comments: A device lead is visualized in  the right ventricle.  LEFT  VENTRICLE PLAX 2D LVIDd:         4.40 cm      Diastology LVIDs:         3.60 cm      LV e' medial:    6.42 cm/s LV PW:         0.90 cm      LV E/e' medial:  10.7 LV IVS:        0.90 cm      LV e' lateral:   6.31 cm/s LVOT diam:     2.00 cm      LV E/e' lateral: 10.9 LV SV:         60 LV SV Index:   34 LVOT Area:     3.14 cm  LV Volumes (MOD) LV vol d, MOD A2C: 130.0 ml LV vol d, MOD A4C: 135.0 ml LV vol s, MOD A2C: 80.1 ml LV vol s, MOD A4C: 73.7 ml LV SV MOD A2C:     49.9 ml LV SV MOD A4C:     135.0 ml LV SV MOD BP:      58.4 ml RIGHT VENTRICLE             IVC RV S prime:     13.70 cm/s  IVC diam: 2.20 cm TAPSE (M-mode): 2.7 cm LEFT ATRIUM             Index        RIGHT ATRIUM           Index LA diam:        3.40 cm 1.93 cm/m   RA Area:     11.70 cm LA Vol (A2C):   32.7 ml 18.60 ml/m  RA Volume:   22.00 ml  12.51 ml/m LA Vol (A4C):   38.0 ml 21.61 ml/m LA Biplane Vol: 38.6 ml 21.95 ml/m  AORTIC VALVE AV Area (Vmax): 2.05 cm AV Vmax:        135.00 cm/s AV Peak Grad:   7.3 mmHg LVOT Vmax:      88.17 cm/s LVOT Vmean:     60.700 cm/s LVOT VTI:       0.190 m  AORTA Ao Root diam: 2.90 cm Ao Asc diam:  3.00 cm MITRAL VALVE               TRICUSPID VALVE MV Area (PHT): 3.65 cm    TR Peak grad:   7.7 mmHg MV Decel Time: 208 msec    TR Vmax:        139.00 cm/s MV E velocity: 68.80 cm/s MV A velocity: 75.30 cm/s  SHUNTS MV E/A ratio:  0.91        Systemic VTI:  0.19 m                            Systemic Diam: 2.00 cm Dorothye Gathers MD Electronically signed by Dorothye Gathers MD Signature Date/Time: 12/05/2023/11:11:21 AM    Final    DG Chest Portable 1 View Result Date: 12/04/2023 CLINICAL DATA:  Shortness of breath. Left-sided thoracentesis two days ago. EXAM: PORTABLE CHEST 1 VIEW COMPARISON:  One-view chest x-ray 12/02/2023 FINDINGS: The heart is enlarged. Atherosclerotic calcifications are present at the aortic arch. Left-sided pneumothorax is improved but incompletely resolved. The right  lung is clear. IMPRESSION: 1. Improved but incompletely resolved left-sided pneumothorax. 2. Cardiomegaly without failure. Electronically Signed   By: Audree Leas M.D.   On: 12/04/2023 16:49  CT Renal Stone Study Result Date: 12/04/2023 CLINICAL DATA:  Abdominal/flank pain, right-sided, history of UTI EXAM: CT ABDOMEN AND PELVIS WITHOUT CONTRAST TECHNIQUE: Multidetector CT imaging of the abdomen and pelvis was performed following the standard protocol without IV contrast. RADIATION DOSE REDUCTION: This exam was performed according to the departmental dose-optimization program which includes automated exposure control, adjustment of the mA and/or kV according to patient size and/or use of iterative reconstruction technique. COMPARISON:  08/08/2023 FINDINGS: Lower chest: Large hydropneumothorax at the left lung base. Transvenous pacing leads. Hepatobiliary: No focal liver abnormality is seen. No gallstones, gallbladder wall thickening, or biliary dilatation. Pancreas: Unremarkable. No pancreatic ductal dilatation or surrounding inflammatory changes. Spleen: Normal in size without focal abnormality. Adrenals/Urinary Tract: No definite adrenal mass. Symmetric renal contours. Right kidney is ptotic. No urolithiasis or hydronephrosis. Urinary bladder partially distended. Stomach/Bowel: Stomach is nondistended, without acute finding. Small bowel decompressed. Colon unremarkable. Vascular/Lymphatic: Extensive aortoiliac calcified plaque without AAA. No evident abdominal or pelvic adenopathy. Reproductive: Status post hysterectomy. No adnexal masses. Other: Trace free fluid in the pelvis. No abdominal ascites. No free air. Musculoskeletal: Mild spondylitic changes in the mid lumbar spine. Osteitis pubis. IMPRESSION: 1. Large left hydropneumothorax. Critical Value/emergent results were called by telephone at the time of interpretation on 12/04/2023 at 12:24 pm to provider [facility] , who verbally acknowledged  these results. 2. No acute findings in the abdomen or pelvis. 3.  Aortic Atherosclerosis (ICD10-I70.0). Electronically Signed   By: Nicoletta Barrier M.D.   On: 12/04/2023 12:25   IR THORACENTESIS ASP PLEURAL SPACE W/IMG GUIDE Result Date: 12/03/2023 INDICATION: Patient with history of CML, CHF, left pleural effusion. IR consulted for therapeutic left thoracentesis. EXAM: ULTRASOUND GUIDED THERAPEUTIC LEFT THORACENTESIS MEDICATIONS: 10 mL 1% lidocaine  COMPLICATIONS: Patient tolerated procedure well with no immediate complication. On post-op CXR, findings show lucency in the left base. Correlate with under-expanded lung. (Pneumothorax ex vacuo). No lung markings are seen in the left apex either. But the line of a pneumothorax is not clearly defined. Findings were reviewed with Dr. Marne Sings who believes this is likely due to a stuck lung and under expansion. Does recommend patient not receive future thoracentesis as she cannot tolerate from today's findings - pt's care team made aware. Pt remains feeling well, Vitals 98% on RA, HR 70, BP 157/73 PROCEDURE: An ultrasound guided thoracentesis was thoroughly discussed with the patient and questions answered. The benefits, risks, alternatives and complications were also discussed. The patient understands and wishes to proceed with the procedure. Written consent was obtained. Ultrasound was performed to localize and mark an adequate pocket of fluid in the left chest. The area was then prepped and draped in the normal sterile fashion. 1% Lidocaine  was used for local anesthesia. Under ultrasound guidance a 6 Fr Safe-T-Centesis catheter was introduced. Thoracentesis was performed. The catheter was removed and a dressing applied. FINDINGS: A total of approximately 1.45 L of yellow fluid was removed. IMPRESSION: Successful ultrasound guided left thoracentesis yielding 1.45 L of pleural fluid. Performed by: Wyatt Pommier, PA-C Electronically Signed   By: Fernando Hoyer M.D.    On: 12/03/2023 12:57   DG Chest 1 View Result Date: 12/02/2023 CLINICAL DATA:  Shortness of breath post thoracentesis EXAM: CHEST  1 VIEW COMPARISON:  August 17, 2023 chest x-ray FINDINGS: Lucency in the left base. Correlate with underexpanded lung.) (Pneumothorax ex vacuo). No lung markings are seen in the left apex either. But the line of a pneumothorax is not clearly defined. Right lung clear Heart  normal size No change in the left subclavian bipolar ICDF pacemaker device tip of the leads in good position no evidence of discontinuity IMPRESSION: Lucency in the left base. Correlate with underexpanded lung. (Pneumothorax ex vacuo). No lung markings are seen in the left apex either. But the line of a pneumothorax is not clearly defined. Electronically Signed   By: Fredrich Jefferson M.D.   On: 12/02/2023 14:41   CUP PACEART REMOTE DEVICE CHECK Result Date: 12/02/2023 Monthly battery check.  RRT on 10/23/23. Previously reported.  Gen change scheduled on 01/07/24 per Epic. Continue monthly battery checks per IFU until generator change per protocol. Normal device function. No new alerts. Follow up as scheduled monthly AB,CVRS Monthly battery check.  RRT on 10/23/23. Previously reported.  Gen change scheduled on 01/07/24 per Epic. Continue monthly battery checks per IFU until generator change per protocol. Normal device function. No new alerts. HF diagnostics abnormal during this monitoring period. Follow up as scheduled monthly AB,CVRS   Micro Results     No results found for this or any previous visit (from the past 240 hours).  Today   Subjective    Rosangelica Cooler today has no headache,no chest abdominal pain,no new weakness tingling or numbness, feels much better wants to go home today.     Objective   Blood pressure 130/65, pulse 80, temperature 98.1 F (36.7 C), temperature source Oral, resp. rate (!) 28, height 5\' 1"  (1.549 m), weight 73.2 kg, last menstrual period 02/14/1995, SpO2  98%.   Intake/Output Summary (Last 24 hours) at 12/16/2023 0836 Last data filed at 12/15/2023 2158 Gross per 24 hour  Intake --  Output 400 ml  Net -400 ml    Exam  Awake Alert, No new F.N deficits,    [place].AT,PERRAL Supple Neck,   Symmetrical Chest wall movement, Good air movement bilaterally, CTAB RRR,No Gallops,   +ve B.Sounds, Abd Soft, Non tender,  No Cyanosis, Clubbing or edema    Data Review   Recent Labs  Lab 12/10/23 0525 12/12/23 0552  WBC 6.4 5.8  HGB 8.8* 8.4*  HCT 27.0* 25.7*  PLT 211 202  MCV 96.8 97.0  MCH 31.5 31.7  MCHC 32.6 32.7  RDW 15.4 15.2    Recent Labs  Lab 12/11/23 0421 12/12/23 0552 12/12/23 0558 12/13/23 0433 12/14/23 0426 12/15/23 0422  NA 134* 135  --  134* 134* 135  K 3.6 3.9  --  3.7 4.1 4.1  CL 96* 97*  --  97* 99 101  CO2 29 30  --  27 28 27   ANIONGAP 9 8  --  10 7 7   GLUCOSE 107* 111*  --  128* 103* 108*  BUN 24* 26*  --  28* 25* 23  CREATININE 1.77* 1.86*  --  1.90* 1.84* 1.79*  MG 1.9  --  1.8 1.6* 2.5* 2.0  CALCIUM  8.6* 8.4*  --  8.6* 8.3* 8.3*    Total Time in preparing paper work, data evaluation and todays exam - 35 minutes  Signature  -    Lynnwood Sauer M.D on 12/16/2023 at 8:36 AM   -  To page go to [email or web address]

## 2023-12-16 NOTE — Progress Notes (Signed)
 Remote ICD transmission.

## 2023-12-16 NOTE — TOC Transition Note (Addendum)
 Transition of Care [facility]) - Discharge Note   Patient Details  Name: Lisa [name] MRN: [id number] Date of Birth: 09/05/1940  [facility] CM/SW Contact:  Eusebio High, RN Phone Number: 12/16/2023, 9:15 AM   Clinical Narrative:     Patient to DC to home today [facility] services will resume care and have someone at the house when patient arrives home  [facility] to transport  No additional TOC needs       Barriers to Discharge: Continued Medical Work up   Patient Goals and CMS Choice Patient states their goals for this hospitalization and ongoing recovery are:: wants to remain independent CMS Medicare.gov Compare Post Acute Care list provided to:: Patient Choice offered to / list presented to : Patient      Discharge Placement                       Discharge Plan and Services Additional resources added to the After Visit Summary for     Discharge Planning Services: CM Consult Post Acute Care Choice: Home Health                               Social Drivers of Health (SDOH) Interventions SDOH Screenings   Food Insecurity: No Food Insecurity (12/07/2023)  Housing: Low Risk  (12/07/2023)  Transportation Needs: No Transportation Needs (12/07/2023)  Utilities: Not At Risk (12/07/2023)  Financial Resource Strain: Low Risk  (10/28/2022)   Received from [facility]  Social Connections: Moderately Isolated (12/07/2023)  Tobacco Use: Low Risk  (12/04/2023)     Readmission Risk Interventions    08/10/2023    2:10 PM 07/19/2023    3:29 PM  Readmission Risk Prevention Plan  Transportation Screening Complete Complete  PCP or Specialist Appt within 5-7 Days  Complete  PCP or Specialist Appt within 3-5 Days Complete   Home Care Screening  Complete  Medication Review (RN CM)  Complete  HRI or Home Care Consult Complete   Palliative Care Screening Not Applicable   Medication Review (RN Care Manager) Complete

## 2023-12-16 NOTE — Progress Notes (Signed)
 Around 1002 pt became dizzy nauseated and vomited during Bowel movement.  compazine  administered it has now subsided. asked if she was still up to par to go home she said yes she is back to her baseline. MD informed

## 2023-12-16 NOTE — Plan of Care (Signed)
 progressing

## 2023-12-19 NOTE — Progress Notes (Unsigned)
 Cardiology Office Note   Date:  12/23/2023   ID:  Lisa [name], DOB Jul 08, 1941, MRN [id number]  PCP:  [name], DO  Cardiologist:   Ola Berger, MD   F/U of HTN      History of Present Illness:F AAMANDA [name] is a 83 y.o. female with a history of CAD, HFrEF, HTN, HL, LBBB, morbid obesity,  hypothroidism, LBBB, chronic lymphedema,   She is s/p VF arrest, (s/p BIV ICD placement 2017) brief PAF (not on anticoagulation because brief)     Pt also has a hx of CML and melanoma 2018:  Echo shoewd LVEF normalized Lisinopril  stopped in past due to hyperkalemia    Feb 2021 Pt hospitalized with episode of VT and ICD therapy.  Cardiac catheterization was done.  This showed a 65% ramus narrowing.  LVEDP was moderately elevated at 23.Lisa [name]  LVEF was 50 to 55%.  She was seen by EP.  Placed on amiodarone .   I saw the pt in Sept 2023   She was seen by Alesia Husky since then, last time in March 2025  ICD battery is reaching end of life  [facility]  Found to have continued large left pleural effusion   The pt underwent L thoracentesis on 12/02/23  1.5 L removed  Complicated by hydropneumothorax Pt had VT with ICD firing   Started on amiodarone  IV  CT 4/26 with large L hydropneumothorax   Seen at [facility] urgent care 4/24 for back and flank pain  Dx UTI  Rx po ABX      She was recently admitted to [facility]  On 4/26 she present4ed with R flank/back pain  FOund to have a large L hydropneuothorax (after a paracentesis on 4/25)  Underwent CT placement   Developed VT with ICD firing    Rx IV amiodaonre, switched to PO   Arrhythmias felt to be due to metabolic abnormalities   Pt sent home on 12/16/23  Since d/c she has been feeling OK, better than she did    She denies palpitations   No dizziness   Breathing is OK   No CP   Current Meds  Medication Sig   acetaminophen  (TYLENOL ) 500 MG tablet Take 1,000 mg by mouth every 6 (six) hours as needed for moderate pain (pain  score 4-6).   allopurinol  (ZYLOPRIM ) 100 MG tablet Take 100 mg by mouth daily.    aluminum hydroxide-magnesium  carbonate (GAVISCON) 95-358 MG/15ML SUSP Take 30 mLs by mouth as needed for indigestion or heartburn.   amiodarone  (PACERONE ) 200 MG tablet Take 1 tablet (200 mg total) by mouth at bedtime.   aspirin  EC 81 MG tablet Take 81 mg by mouth in the morning.   atorvastatin  (LIPITOR) 20 MG tablet Take 1 tablet (20 mg total) by mouth daily.   b complex vitamins capsule Take 1 capsule by mouth as needed.   calcium  carbonate (OS-CAL - DOSED IN MG OF ELEMENTAL CALCIUM ) 1250 (500 Ca) MG tablet Take 1 tablet by mouth daily as needed (Calcium  levels).   Cholecalciferol  (VITAMIN D -3 PO) Take 5,000 Units by mouth daily.   Cyanocobalamin (VITAMIN B12 SL) Place 1 tablet under the tongue daily.   imatinib  (GLEEVEC ) 400 MG tablet Take 400 mg by mouth at bedtime.   MAGNESIUM  GLYCINATE PO Take 1 tablet by mouth at bedtime.   Multiple Vitamin (MULTIVITAMIN WITH MINERALS) TABS tablet Take 1 tablet by mouth daily.   nitroGLYCERIN  (NITROSTAT ) 0.4 MG SL  tablet Place 1 tablet (0.4 mg total) under the tongue every 5 (five) minutes x 3 doses as needed for chest pain.   thyroid  (ARMOUR) 15 MG tablet Take 15 mg by mouth daily before breakfast.    torsemide  (DEMADEX ) 20 MG tablet Take 1 tablet (20 mg total) by mouth daily. (Patient taking differently: Take 20 mg by mouth See admin instructions. Take 20mg  (1 tablet) by mouth one day alternating with 20mg  (1 tablet) twice a day every other day.)   vitamin C  (ASCORBIC ACID ) 500 MG tablet Take 500 mg by mouth as needed.   [DISCONTINUED] potassium chloride  (KLOR-CON  M) 10 MEQ tablet Take 10 mEq by mouth as needed.   [DISCONTINUED] potassium chloride  SA (KLOR-CON  M) 20 MEQ tablet Take 20 mEq by mouth as needed. 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) 05/22/2022   Bacteremia due to Pseudomonas 06/16/2022   CAD in native artery 2017   stent to LAD   Cardiac arrest (HCC) 09/2015   CHF (congestive heart failure) (HCC)    CML (chronic myelocytic leukemia) (HCC)    Complication of anesthesia    Hx: UTI (urinary tract infection)    Hypertension    Melanoma (HCC)    PONV (postoperative nausea and vomiting)    PVC (premature ventricular contraction)    HISTORY OF   Thyroid  disease    V tach (HCC)     Past Surgical History:  Procedure Laterality Date   ABDOMINAL HYSTERECTOMY     APPENDECTOMY     CARDIAC CATHETERIZATION N/A 10/03/2015   Procedure: Left Heart Cath and Coronary Angiography;  Surgeon: Peter M Swaziland, MD;  Location: [facility] INVASIVE CV LAB;  Service: Cardiovascular;  Laterality: N/A;   CARDIAC CATHETERIZATION N/A 10/07/2015   Procedure: Coronary Stent Intervention;  Surgeon: Odie Benne, MD;  Location: [facility] INVASIVE CV LAB;  Service: Cardiovascular;  Laterality: N/A;   CARDIAC CATHETERIZATION  09/22/2019   EP IMPLANTABLE DEVICE N/A 01/20/2016   Procedure: BiV ICD Insertion CRT-D;  Surgeon: Tammie Fall, MD;  Location: [facility] INVASIVE CV LAB;  Service: Cardiovascular;  Laterality: N/A;   IR THORACENTESIS ASP PLEURAL SPACE W/IMG GUIDE  07/20/2023   IR THORACENTESIS ASP PLEURAL SPACE W/IMG GUIDE  12/02/2023   LEFT HEART CATH AND CORONARY ANGIOGRAPHY N/A 09/22/2019   Procedure: LEFT HEART CATH AND CORONARY ANGIOGRAPHY;  Surgeon: Swaziland, Peter M, MD;  Location: [facility] INVASIVE CV LAB;  Service: Cardiovascular;  Laterality: N/A;   TONSILLECTOMY     TONSILLECTOMY     TUBAL LIGATION       Social History:  The patient  reports that she has never smoked. She has never used smokeless tobacco. She reports that  she does not drink alcohol and does not use drugs.   Family History:  The patient's family history includes Coronary artery disease in her mother; Heart attack in her maternal grandfather and paternal grandfather; Multiple myeloma in her father; Thyroid  disease in her mother.    ROS:  Please see the history of present illness. All other systems are reviewed and  Negative to the above problem except as noted.    PHYSICAL EXAM: VS:  BP 120/66   Pulse 79  Ht 5' 1.5" (1.562 m)   Wt 150 lb (68 kg)   LMP 02/14/1995   SpO2 98%   BMI 27.88 kg/m   GEN: Morbidly obese 83 yo in no acute distress   Examined in chair HEENT: normal  Neck: JVP is normal \\Cardiac : RRR; no murmur Lungs Clear to auscultation Abdomen   Obese  Nontender  Leg:   Chronic lymphedema  EKG:  EKG is not done today    Echo April 2025  1. Left ventricular ejection fraction, by estimation, is 40 to 45%. The  left ventricle has mildly decreased function. The left ventricle has no  regional wall motion abnormalities. Left ventricular diastolic parameters  are consistent with Grade I  diastolic dysfunction (impaired relaxation).   2. Right ventricular systolic function is normal. The right ventricular  size is normal.   3. The mitral valve is normal in structure. No evidence of mitral valve  regurgitation. No evidence of mitral stenosis.   4. The aortic valve is normal in structure. Aortic valve regurgitation is  not visualized. No aortic stenosis is present.   5. The inferior vena cava is normal in size with greater than 50%  respiratory variability, suggesting right atrial pressure of 3 mmHg.   Comparison(s): No significant change from prior study. Prior images  reviewed side by side.   Lipid Panel    Component Value Date/Time   CHOL 92 (L) 10/06/2019 1059   TRIG 92 10/06/2019 1059   HDL 39 (L) 10/06/2019 1059   CHOLHDL 2.4 10/06/2019 1059   CHOLHDL 4.2 10/04/2015 0329   VLDL 18 10/04/2015 0329   LDLCALC 35  10/06/2019 1059      Wt Readings from Last 3 Encounters:  12/20/23 150 lb (68 kg)  12/16/23 161 lb 6 oz (73.2 kg)  11/01/23 174 lb (78.9 kg)      ASSESSMENT AND PLAN:  1  HFrEF  Pt with mld /mod LV dysfunction. She is s/p BiV ICD   Plan for gen change in late may Clinically improved compared to recent hospitalziaation Volume status overall OK Given all of recent admit and plan for repeat EP procedure I would not make any med changes    Will follow this summer    2   CAD PT had cath in February 2021 after a VT event.  No obstructive CAD noted.Patent stent  Pt denies CP   3  Hx VF arrest   S/p [employer] BiV ICD   REcent admit with VT   Received more amiodarone      Continue    Appt in EP soon    4 Transient PAF short-lived she is not on anticoagulation. Continue to follow on device   5  HTN  BP is well  controlled   6   Lipids  Lipids will need to be checked when gets labs again     7.  Thyroid  TSH 4.1 in April 2025   8  CML.  Continues to follow with oncology  Continue on Gleevac   [place]  Current medicines are reviewed at length with the patient today.  The patient does not have concerns regarding medicines.  Signed, Ola Berger, MD  12/23/2023 8:16 PM    [facility] [address] [place], [name], [place]  [address] Phone: [phone]; Fax: [phone]

## 2023-12-20 ENCOUNTER — Ambulatory Visit: Attending: Internal Medicine | Admitting: Internal Medicine

## 2023-12-20 ENCOUNTER — Encounter: Payer: Self-pay | Admitting: Internal Medicine

## 2023-12-20 VITALS — BP 120/66 | HR 79 | Ht 61.5 in | Wt 150.0 lb

## 2023-12-20 DIAGNOSIS — I5020 Unspecified systolic (congestive) heart failure: Secondary | ICD-10-CM | POA: Insufficient documentation

## 2023-12-20 MED ORDER — POTASSIUM CHLORIDE CRYS ER 10 MEQ PO TBCR: 10.0000 meq | EXTENDED_RELEASE_TABLET | ORAL | 3 refills | Status: AC | PRN

## 2023-12-20 NOTE — Patient Instructions (Signed)
 Medication Instructions:   *If you need a refill on your cardiac medications before your next appointment, please call your pharmacy*  Lab Work:  If you have labs (blood work) drawn today and your tests are completely normal, you will receive your results only by: MyChart Message (if you have MyChart) OR A paper copy in the mail If you have any lab test that is abnormal or we need to change your treatment, we will call you to review the results.  Testing/Procedures:   Follow-Up: October 2025 At [facility], you and your health needs are our priority.  As part of our continuing mission to provide you with exceptional heart care, our providers are all part of one team.  This team includes your primary Cardiologist (physician) and Advanced Practice Providers or APPs (Physician Assistants and Nurse Practitioners) who all work together to provide you with the care you need, when you need it.  Your next appointment:   We recommend signing up for the patient portal called "MyChart".  Sign up information is provided on this After Visit Summary.  MyChart is used to connect with patients for Virtual Visits (Telemedicine).  Patients are able to view lab/test results, encounter notes, upcoming appointments, etc.  Non-urgent messages can be sent to your provider as well.   To learn more about what you can do with MyChart, go to [email or web address].   Other Instructions

## 2023-12-22 ENCOUNTER — Encounter (HOSPITAL_BASED_OUTPATIENT_CLINIC_OR_DEPARTMENT_OTHER)

## 2023-12-27 ENCOUNTER — Telehealth: Payer: Self-pay

## 2023-12-27 DIAGNOSIS — E7849 Other hyperlipidemia: Secondary | ICD-10-CM

## 2023-12-27 NOTE — Telephone Encounter (Signed)
-----   Message from [place] sent at 12/23/2023  8:22 PM EDT ----- Have pt get lipomed panel on next lab check Add her in to see me later this summer    She will be seeing EP after gen change

## 2023-12-27 NOTE — Telephone Encounter (Signed)
 Pt advised that we will plan to see her late summer 2025.. recall added... she will have her NMR drawn next time she is in the lab and prior to her OV.

## 2023-12-28 ENCOUNTER — Telehealth: Payer: Self-pay | Admitting: Internal Medicine

## 2023-12-28 ENCOUNTER — Encounter: Payer: Self-pay | Admitting: Internal Medicine

## 2023-12-28 ENCOUNTER — Inpatient Hospital Stay (HOSPITAL_COMMUNITY)
Admission: EM | Admit: 2023-12-28 | Discharge: 2024-01-08 | DRG: 245 | Disposition: A | Discharge status: Home Health | Attending: Student | Admitting: Student

## 2023-12-28 ENCOUNTER — Other Ambulatory Visit: Payer: Self-pay

## 2023-12-28 DIAGNOSIS — K5900 Constipation, unspecified: Secondary | ICD-10-CM | POA: Diagnosis present

## 2023-12-28 DIAGNOSIS — I472 Ventricular tachycardia, unspecified: Secondary | ICD-10-CM | POA: Diagnosis not present

## 2023-12-28 DIAGNOSIS — I2510 Atherosclerotic heart disease of native coronary artery without angina pectoris: Secondary | ICD-10-CM | POA: Diagnosis present

## 2023-12-28 DIAGNOSIS — R140 Abdominal distension (gaseous): Secondary | ICD-10-CM | POA: Diagnosis not present

## 2023-12-28 DIAGNOSIS — E669 Obesity, unspecified: Secondary | ICD-10-CM | POA: Diagnosis present

## 2023-12-28 DIAGNOSIS — I5023 Acute on chronic systolic (congestive) heart failure: Secondary | ICD-10-CM | POA: Diagnosis present

## 2023-12-28 DIAGNOSIS — Z85820 Personal history of malignant melanoma of skin: Secondary | ICD-10-CM

## 2023-12-28 DIAGNOSIS — R54 Age-related physical debility: Secondary | ICD-10-CM | POA: Diagnosis present

## 2023-12-28 DIAGNOSIS — Z91048 Other nonmedicinal substance allergy status: Secondary | ICD-10-CM

## 2023-12-28 DIAGNOSIS — Z86711 Personal history of pulmonary embolism: Secondary | ICD-10-CM

## 2023-12-28 DIAGNOSIS — Z8349 Family history of other endocrine, nutritional and metabolic diseases: Secondary | ICD-10-CM

## 2023-12-28 DIAGNOSIS — D631 Anemia in chronic kidney disease: Secondary | ICD-10-CM | POA: Diagnosis present

## 2023-12-28 DIAGNOSIS — R7989 Other specified abnormal findings of blood chemistry: Secondary | ICD-10-CM

## 2023-12-28 DIAGNOSIS — J948 Other specified pleural conditions: Secondary | ICD-10-CM | POA: Diagnosis present

## 2023-12-28 DIAGNOSIS — Z888 Allergy status to other drugs, medicaments and biological substances status: Secondary | ICD-10-CM

## 2023-12-28 DIAGNOSIS — Z8249 Family history of ischemic heart disease and other diseases of the circulatory system: Secondary | ICD-10-CM

## 2023-12-28 DIAGNOSIS — Z880 Allergy status to penicillin: Secondary | ICD-10-CM

## 2023-12-28 DIAGNOSIS — I442 Atrioventricular block, complete: Secondary | ICD-10-CM | POA: Diagnosis present

## 2023-12-28 DIAGNOSIS — D649 Anemia, unspecified: Secondary | ICD-10-CM

## 2023-12-28 DIAGNOSIS — C9110 Chronic lymphocytic leukemia of B-cell type not having achieved remission: Secondary | ICD-10-CM | POA: Diagnosis present

## 2023-12-28 DIAGNOSIS — Z79899 Other long term (current) drug therapy: Secondary | ICD-10-CM

## 2023-12-28 DIAGNOSIS — Z883 Allergy status to other anti-infective agents status: Secondary | ICD-10-CM

## 2023-12-28 DIAGNOSIS — Z6827 Body mass index (BMI) 27.0-27.9, adult: Secondary | ICD-10-CM

## 2023-12-28 DIAGNOSIS — Z955 Presence of coronary angioplasty implant and graft: Secondary | ICD-10-CM

## 2023-12-28 DIAGNOSIS — R112 Nausea with vomiting, unspecified: Secondary | ICD-10-CM | POA: Diagnosis not present

## 2023-12-28 DIAGNOSIS — C9210 Chronic myeloid leukemia, BCR/ABL-positive, not having achieved remission: Secondary | ICD-10-CM | POA: Diagnosis present

## 2023-12-28 DIAGNOSIS — N289 Disorder of kidney and ureter, unspecified: Secondary | ICD-10-CM

## 2023-12-28 DIAGNOSIS — Z882 Allergy status to sulfonamides status: Secondary | ICD-10-CM

## 2023-12-28 DIAGNOSIS — R079 Chest pain, unspecified: Secondary | ICD-10-CM | POA: Diagnosis not present

## 2023-12-28 DIAGNOSIS — I4901 Ventricular fibrillation: Secondary | ICD-10-CM | POA: Diagnosis present

## 2023-12-28 DIAGNOSIS — L89152 Pressure ulcer of sacral region, stage 2: Secondary | ICD-10-CM | POA: Diagnosis present

## 2023-12-28 DIAGNOSIS — E039 Hypothyroidism, unspecified: Secondary | ICD-10-CM | POA: Diagnosis present

## 2023-12-28 DIAGNOSIS — Z95810 Presence of automatic (implantable) cardiac defibrillator: Secondary | ICD-10-CM

## 2023-12-28 DIAGNOSIS — R627 Adult failure to thrive: Secondary | ICD-10-CM | POA: Diagnosis present

## 2023-12-28 DIAGNOSIS — N179 Acute kidney failure, unspecified: Secondary | ICD-10-CM | POA: Diagnosis present

## 2023-12-28 DIAGNOSIS — Z8674 Personal history of sudden cardiac arrest: Secondary | ICD-10-CM

## 2023-12-28 DIAGNOSIS — Z7982 Long term (current) use of aspirin: Secondary | ICD-10-CM

## 2023-12-28 DIAGNOSIS — I130 Hypertensive heart and chronic kidney disease with heart failure and stage 1 through stage 4 chronic kidney disease, or unspecified chronic kidney disease: Secondary | ICD-10-CM | POA: Diagnosis present

## 2023-12-28 DIAGNOSIS — N1832 Chronic kidney disease, stage 3b: Secondary | ICD-10-CM | POA: Diagnosis present

## 2023-12-28 DIAGNOSIS — Z4502 Encounter for adjustment and management of automatic implantable cardiac defibrillator: Secondary | ICD-10-CM

## 2023-12-28 DIAGNOSIS — R531 Weakness: Secondary | ICD-10-CM

## 2023-12-28 DIAGNOSIS — Z807 Family history of other malignant neoplasms of lymphoid, hematopoietic and related tissues: Secondary | ICD-10-CM

## 2023-12-28 NOTE — Telephone Encounter (Signed)
  1. Has your device fired? no  2. Is you device beeping? no  3. Are you experiencing draining or swelling at device site?   4. Are you calling to see if we received your device transmission?  Alarm went off on her Monitor box  5. Have you passed out? no   Please route to Device Clinic Pool   I did not need this encounter

## 2023-12-28 NOTE — Progress Notes (Deleted)
 Cardiology Office Note:  .   Date:  12/28/2023  ID:  Lisa [name], DOB 08-26-1940, MRN [id number] PCP: Lisa Bloch, DO  [facility] Providers Cardiologist:  Lisa Berger, MD Electrophysiologist:  Lisa Sells, MD {  History of Present Illness: Lisa [name]   Lisa [name] is a 83 y.o. female w/PMHx of  HTN, HLD, CKD (IIIb) LBBB, CAD (DES to LAD 2017), chronic CHF (systolic), NICM, with recovery of her LVEF s/p CRT-D,  CML (followed at [facility], treated with Gleevac)   Of late 07/2023 with R leg cellulitis, bilateral lymphedemia, pseudomonas infection, a/c CHF, pleural effusion requiring thoracentesis   Jan 2025  Readmitted with recurrent left sided pleural effusion, acute hypoxic respiratory failure, acute on chronic HFimpEF.  L thora hypoalbuminemia GDMT: limited in the setting of renal function/BP. MRA not pursued due to this. Felt high risk for UTI so no SGLT2i. Metolazone  discontinued. Now on torsemide  20mg  PO BID. Toprol  held due to hypotension.   12/04/23:  flank pain for several days. She was previously seen at urgent care with a diagnosis of a UTI. Urine cultures were positive for E. coli. With her flank pain, she had decreased p.o. intake with nausea. While in the emergency room, she went into ventricular tachycardia  Again in the setting of acute illness IV amiodarone  > PO Known ERI > planned for discharge and gen change out pt Recurrent pleural effusion > L hydropneuothorax (after a paracentesis on 4/25) > CT Negative BC (x2) for 5 days Discharged 12/15/13  She saw Dr Lisa [name] 12/20/23, no syncope, shocks, denied symptoms.  Volume status was ok No changes made   Today's visit is scheduled as a post hospital visit/pre gen change  ROS:   *** ill?  Fever? *** gen change procedure *** wounds?  legs  Device information MDT CRT-D, implanted 01/20/2016   + appropriate therapies Feb 2021,  3 treated VF-zone episodes:  1 PMVT/VF episode 09/21/19 at 02:01,  converted to AF/VS rhythm after 35J shock,  1 episode of AF w/RVR vs AF/VT (double tachycardia) on 09/21/19 at 03:15, ATP x1 failed, late break after 35J shock x1 (and conversion of AF to SR).  CATH this admission with NOD, LVEF 50% Started on amiodarone   Oct 2023  9  appropriately treated episodes for VF 13 NSVT episodes all ATPs failed, all shocks were successful  Suspected provoked by acute febrile illness and HF exacerbation Amio gtt/re-loaded Not felt to need a new ischemic evaluation  April 2024 VT  AAD hx Amiodarone  started 2021   Studies Reviewed: Lisa [name]    EKG not done today  DEVICE interrogation done today and reviewed by myself *** Battery and lead measurements are good ***  April 2025: TTE 1. Left ventricular ejection fraction, by estimation, is 40 to 45%. The  left ventricle has mildly decreased function. The left ventricle has no  regional wall motion abnormalities. Left ventricular diastolic parameters  are consistent with Grade I  diastolic dysfunction (impaired relaxation).   2. Right ventricular systolic function is normal. The right ventricular  size is normal.   3. The mitral valve is normal in structure. No evidence of mitral valve  regurgitation. No evidence of mitral stenosis.   4. The aortic valve is normal in structure. Aortic valve regurgitation is  not visualized. No aortic stenosis is present.   5. The inferior vena cava is normal in size with greater than 50%  respiratory variability, suggesting right atrial pressure of 3 mmHg.   Comparison(s): No  significant change from prior study. Prior images  reviewed side by side.    echo 08/09/23  1. Left ventricular ejection fraction, by estimation, is 55 to 60%. The  left ventricle has normal function. The left ventricle has no regional  wall motion abnormalities. Left ventricular diastolic parameters are  consistent with Grade I diastolic  dysfunction (impaired relaxation).   2. Right ventricular  systolic function is normal. The right ventricular  size is normal.   3. Large pleural effusion.   4. The mitral valve is normal in structure. No evidence of mitral valve  regurgitation. No evidence of mitral stenosis.   5. The aortic valve is normal in structure. Aortic valve regurgitation is  not visualized. No aortic stenosis is present.   6. The inferior vena cava is dilated in size with <50% respiratory  variability, suggesting right atrial pressure of 15 mmHg.    06/18/21: TTE 1. There is turbulence in distal outflow tract which is contributing to  murmur (no significant gradiet). Left ventricular ejection fraction, by  estimation, is 45 to 50%. The left ventricle has mildly decreased  function. The left ventricle demonstrates  global hypokinesis. The left ventricular internal cavity size was mildly  to moderately dilated. There is mild left ventricular hypertrophy of the  posterior segment. Left ventricular diastolic parameters are consistent  with Grade II diastolic dysfunction  (pseudonormalization).   2. Right ventricular systolic function is normal. The right ventricular  size is normal. There is normal pulmonary artery systolic pressure.   3. Left atrial size was severely dilated.   4. Right atrial size was mildly dilated.   5. The mitral valve is normal in structure. Moderate mitral valve  regurgitation. No evidence of mitral stenosis.   6. The aortic valve is tricuspid. There is moderate calcification of the  aortic valve. There is mild thickening of the aortic valve. Aortic valve  regurgitation is not visualized. Mild to moderate aortic valve  sclerosis/calcification is present, without  any evidence of aortic stenosis.   7. The inferior vena cava is dilated in size with >50% respiratory  variability, suggesting right atrial pressure of 8 mmHg.   Comparison(s): Prior images reviewed side by side.      09/22/2019: LHC Mid LM to Dist LM lesion is 20%  stenosed. Previously placed Mid LAD stent (unknown type) is widely patent. Ramus lesion is 65% stenosed. The left ventricular systolic function is normal. LV end diastolic pressure is moderately elevated. The left ventricular ejection fraction is 50-55% by visual estimate.   1. Nonobstructive CAD. The stent in the mid LAD is widely patent. There is modest ostial disease in a small ramus branch that is unchanged from 2017. 2. Mild LV dysfunction. EF 50%. 3. Elevated LVEDP 23 mm Hg.   Risk Assessment/Calculations:    Physical Exam:   VS:  LMP 02/14/1995    Wt Readings from Last 3 Encounters:  12/20/23 150 lb (68 kg)  12/16/23 161 lb 6 oz (73.2 kg)  11/01/23 174 lb (78.9 kg)    GEN: Well nourished, well developed in no acute distress NECK: No JVD; No carotid bruits CARDIAC: ***RRR, no murmurs, rubs, gallops RESPIRATORY:  *** CTA b/l without rales, wheezing or rhonchi  ABDOMEN: Soft, non-tender, non-distended EXTREMITIES:  No edema; No deformity   ICD site: *** is stable, no thinning, fluctuation, tethering  ASSESSMENT AND PLAN: .    ICD *** ERI, otherwise, intact function *** no changes made ***  VT/VF Chronic amiodarone  ***  CAD ***  C/w Dr. 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 Lazaro Prime     Dispo: ***  Signed, Debbie Fails, PA-C

## 2023-12-28 NOTE — Telephone Encounter (Signed)
 I spoke with the pt to get her to send a manual transmission. Her monitor gave her an error code 3230. I asked her to let it charge for an hour and try again. I told her I will check in an hour to see if the transmission goes through.

## 2023-12-28 NOTE — Telephone Encounter (Signed)
 Message was addressed in Phone note. Pt is at [facility].

## 2023-12-28 NOTE — Telephone Encounter (Addendum)
 ICD reached ER 12/28/23. Known and scheduled for 01/07/24.  Remote transmission received, 1 shock for VF event on 12/24/23 @ 2159. Shock restored AS-BVP. Routing to triage per protocol.  Pt also received 2 shocks on 12/04/23 that was noted on a previous interrogation. Pt was possibly in hospital @ that time per Epic.   Patient reports she was asleep during shock and not aware of shock. States she was not aware of shock. Reports increased fatigue and lightheaded for several days. Moved up pts apt with see Dr. Carolynne Citron tomorrow 12/29/23 @ 11:45 am. Patient advised if she receives any further shocks to cal 911 and got to the emergency department. Advised no driving per [place] DMV x6 months starting 12/24/23. Pt voiced understanding.

## 2023-12-28 NOTE — ED Triage Notes (Signed)
 Pt BIB EMS from home. Pt reporting chest heaviness that radiates through her neck. Also reports dizziness. Pt states that she has not been feeling well x2 weeks. She was called on Monday by her cardiologist office, which they reported her pacemaker fired at 0200 on Saturday morning. Pt reports since Saturday her "heaviness" and rt sided rib pain has gradually gotten worse. Denies SOB.

## 2023-12-29 ENCOUNTER — Observation Stay (HOSPITAL_COMMUNITY)

## 2023-12-29 ENCOUNTER — Emergency Department (HOSPITAL_COMMUNITY)

## 2023-12-29 ENCOUNTER — Encounter (HOSPITAL_COMMUNITY): Payer: Self-pay | Admitting: Internal Medicine

## 2023-12-29 ENCOUNTER — Observation Stay: Admitting: Internal Medicine

## 2023-12-29 DIAGNOSIS — Z4502 Encounter for adjustment and management of automatic implantable cardiac defibrillator: Secondary | ICD-10-CM | POA: Diagnosis not present

## 2023-12-29 DIAGNOSIS — I5020 Unspecified systolic (congestive) heart failure: Secondary | ICD-10-CM | POA: Diagnosis not present

## 2023-12-29 DIAGNOSIS — I2510 Atherosclerotic heart disease of native coronary artery without angina pectoris: Secondary | ICD-10-CM | POA: Diagnosis not present

## 2023-12-29 DIAGNOSIS — I4901 Ventricular fibrillation: Secondary | ICD-10-CM

## 2023-12-29 DIAGNOSIS — I471 Supraventricular tachycardia, unspecified: Secondary | ICD-10-CM | POA: Diagnosis not present

## 2023-12-29 DIAGNOSIS — I472 Ventricular tachycardia, unspecified: Secondary | ICD-10-CM | POA: Diagnosis not present

## 2023-12-29 LAB — CBC
HCT: 27.5 % — ABNORMAL LOW (ref 36.0–46.0)
HCT: 29.4 % — ABNORMAL LOW (ref 36.0–46.0)
Hemoglobin: 8.9 g/dL — ABNORMAL LOW (ref 12.0–15.0)
Hemoglobin: 9.2 g/dL — ABNORMAL LOW (ref 12.0–15.0)
MCH: 31.4 pg (ref 26.0–34.0)
MCH: 31.7 pg (ref 26.0–34.0)
MCHC: 31.3 g/dL (ref 30.0–36.0)
MCHC: 32.4 g/dL (ref 30.0–36.0)
MCV: 100.3 fL — ABNORMAL HIGH (ref 80.0–100.0)
MCV: 97.9 fL (ref 80.0–100.0)
Platelets: 252 10*3/uL (ref 150–400)
Platelets: 252 10*3/uL (ref 150–400)
RBC: 2.81 MIL/uL — ABNORMAL LOW (ref 3.87–5.11)
RBC: 2.93 MIL/uL — ABNORMAL LOW (ref 3.87–5.11)
RDW: 15.4 % (ref 11.5–15.5)
RDW: 15.5 % (ref 11.5–15.5)
WBC: 7.9 10*3/uL (ref 4.0–10.5)
WBC: 8.8 10*3/uL (ref 4.0–10.5)
nRBC: 0 % (ref 0.0–0.2)
nRBC: 0 % (ref 0.0–0.2)

## 2023-12-29 LAB — BASIC METABOLIC PANEL WITH GFR
Anion gap: 11 (ref 5–15)
BUN: 31 mg/dL — ABNORMAL HIGH (ref 8–23)
CO2: 26 mmol/L (ref 22–32)
Calcium: 9.1 mg/dL (ref 8.9–10.3)
Chloride: 99 mmol/L (ref 98–111)
Creatinine, Ser: 2.13 mg/dL — ABNORMAL HIGH (ref 0.44–1.00)
GFR, Estimated: 23 mL/min — ABNORMAL LOW (ref >60)
Glucose, Bld: 118 mg/dL — ABNORMAL HIGH (ref 70–99)
Potassium: 3.9 mmol/L (ref 3.5–5.1)
Sodium: 136 mmol/L (ref 135–145)

## 2023-12-29 LAB — D-DIMER, QUANTITATIVE: D-Dimer, Quant: 8.17 ug{FEU}/mL — ABNORMAL HIGH (ref 0.00–0.50)

## 2023-12-29 LAB — TROPONIN I (HIGH SENSITIVITY)
Troponin I (High Sensitivity): 15 ng/L (ref <18)
Troponin I (High Sensitivity): 14 ng/L (ref <18)

## 2023-12-29 LAB — CREATININE, SERUM
Creatinine, Ser: 1.68 mg/dL — ABNORMAL HIGH (ref 0.44–1.00)
GFR, Estimated: 30 mL/min — ABNORMAL LOW (ref >60)

## 2023-12-29 LAB — MAGNESIUM: Magnesium: 1.5 mg/dL — ABNORMAL LOW (ref 1.7–2.4)

## 2023-12-29 LAB — BRAIN NATRIURETIC PEPTIDE: B Natriuretic Peptide: 447.5 pg/mL — ABNORMAL HIGH (ref 0.0–100.0)

## 2023-12-29 MED ORDER — HYDROCODONE-ACETAMINOPHEN 5-325 MG PO TABS
1.0000 | ORAL_TABLET | Freq: Once | ORAL | Status: AC
  Administered 2023-12-29: 1 via ORAL
  Filled 2023-12-29: qty 1

## 2023-12-29 MED ORDER — ONDANSETRON HCL 4 MG/2ML IJ SOLN
4.0000 mg | Freq: Once | INTRAMUSCULAR | Status: AC
  Administered 2023-12-29: 4 mg via INTRAVENOUS
  Filled 2023-12-29: qty 2

## 2023-12-29 MED ORDER — MAGNESIUM SULFATE 2 GM/50ML IV SOLN
2.0000 g | Freq: Once | INTRAVENOUS | Status: AC
  Administered 2023-12-29: 2 g via INTRAVENOUS
  Filled 2023-12-29: qty 50

## 2023-12-29 MED ORDER — THYROID 30 MG PO TABS
15.0000 mg | ORAL_TABLET | Freq: Every day | ORAL | Status: DC
  Administered 2023-12-30 – 2024-01-08 (×9): 15 mg via ORAL
  Filled 2023-12-29 (×11): qty 1

## 2023-12-29 MED ORDER — TECHNETIUM TO 99M ALBUMIN AGGREGATED
4.4000 | Freq: Once | INTRAVENOUS | Status: AC | PRN
  Administered 2023-12-29: 4.4 via INTRAVENOUS

## 2023-12-29 MED ORDER — ASPIRIN 81 MG PO TBEC
81.0000 mg | DELAYED_RELEASE_TABLET | Freq: Every morning | ORAL | Status: DC
  Administered 2023-12-29 – 2024-01-08 (×10): 81 mg via ORAL
  Filled 2023-12-29 (×10): qty 1

## 2023-12-29 MED ORDER — LIDOCAINE 5 % EX PTCH
1.0000 | MEDICATED_PATCH | CUTANEOUS | Status: DC
  Administered 2023-12-29 – 2023-12-30 (×2): 1 via TRANSDERMAL
  Filled 2023-12-29 (×6): qty 1

## 2023-12-29 MED ORDER — ACETAMINOPHEN 500 MG PO TABS
1000.0000 mg | ORAL_TABLET | Freq: Three times a day (TID) | ORAL | Status: DC
  Administered 2023-12-29 – 2023-12-30 (×6): 1000 mg via ORAL
  Filled 2023-12-29 (×7): qty 2

## 2023-12-29 MED ORDER — MAGNESIUM OXIDE -MG SUPPLEMENT 400 (240 MG) MG PO TABS
800.0000 mg | ORAL_TABLET | Freq: Every day | ORAL | Status: DC
  Administered 2023-12-29 – 2024-01-08 (×9): 800 mg via ORAL
  Filled 2023-12-29 (×11): qty 2

## 2023-12-29 MED ORDER — ENOXAPARIN SODIUM 30 MG/0.3ML IJ SOSY
30.0000 mg | PREFILLED_SYRINGE | INTRAMUSCULAR | Status: DC
  Administered 2023-12-29 – 2023-12-30 (×2): 30 mg via SUBCUTANEOUS
  Filled 2023-12-29 (×2): qty 0.3

## 2023-12-29 MED ORDER — FUROSEMIDE 20 MG PO TABS: 20.0000 mg | ORAL_TABLET | Freq: Two times a day (BID) | ORAL | Status: DC

## 2023-12-29 MED ORDER — ATORVASTATIN CALCIUM 10 MG PO TABS
20.0000 mg | ORAL_TABLET | Freq: Every day | ORAL | Status: DC
Start: 2023-12-29 — End: 2024-01-08
  Administered 2023-12-29 – 2024-01-08 (×10): 20 mg via ORAL
  Filled 2023-12-29 (×11): qty 2

## 2023-12-29 MED ORDER — IPRATROPIUM-ALBUTEROL 0.5-2.5 (3) MG/3ML IN SOLN: 3.0000 mL | Freq: Four times a day (QID) | RESPIRATORY_TRACT | Status: DC | PRN

## 2023-12-29 MED ORDER — FUROSEMIDE 20 MG PO TABS
20.0000 mg | ORAL_TABLET | Freq: Two times a day (BID) | ORAL | Status: DC
  Administered 2023-12-29 – 2023-12-30 (×2): 20 mg via ORAL
  Filled 2023-12-29 (×2): qty 1

## 2023-12-29 MED ORDER — AMIODARONE HCL 200 MG PO TABS
200.0000 mg | ORAL_TABLET | Freq: Every day | ORAL | Status: DC
  Administered 2023-12-29: 200 mg via ORAL
  Filled 2023-12-29: qty 1

## 2023-12-29 NOTE — Care Management Obs Status (Signed)
 MEDICARE OBSERVATION STATUS NOTIFICATION   Patient Details  Name: Lisa [name] MRN: [id number] Date of Birth: Aug 12, 1940   Medicare Observation Status Notification Given:  Yes    Derron Pipkins, RN 12/29/2023, 7:18 PM

## 2023-12-29 NOTE — H&P (Signed)
 History and Physical    Patient: Lisa [name] WUJ:[id number] DOB: 06-Apr-1941 DOA: 12/28/2023 DOS: the patient was seen and examined on 12/29/2023 PCP: Faustina Hood, MD  Patient coming from: Home  Chief Complaint:  Chief Complaint  Patient presents with   Chest Pain   HPI: Lisa [name] is a 83 y.o. female with medical history significant of VT/VF arrest, HFpEF (EF 40-45% in 11/2023), HTN, CKD3, CML, lymphedema, and hypothyroidism p/w reported ICD shock on 5/16.  Pt states that she was in her USOH until she received a call from her ICD company on 5/20 for a reported ICD shock that occurred on 5/16 at 0200. Pt states that she was directed to report to her cardiologist on 5/21, but overnight she experienced dizziness and chest pressure at 2100 that prompted her to activate EMS.  In the ED, pt noted to be bradycardic and tachypneic. Labs demonstrated BUN/Cr 31/2.13, BNP 447, troponin 15-->14, and D-dimer 8.17.  Review of Systems: As mentioned in the history of present illness. All other systems reviewed and are negative. Past Medical History:  Diagnosis Date   Acute on chronic systolic heart failure (HCC)    Acute renal failure superimposed on stage 3b chronic kidney disease (HCC) 05/22/2022   Bacteremia due to Pseudomonas 06/16/2022   CAD in native artery 2017   stent to LAD   Cardiac arrest [facility]) 09/2015   CHF (congestive heart failure) (HCC)    CML (chronic myelocytic leukemia) (HCC)    Complication of anesthesia    Hx: UTI (urinary tract infection)    Hypertension    Melanoma (HCC)    PONV (postoperative nausea and vomiting)    PVC (premature ventricular contraction)    HISTORY OF   Thyroid  disease    V tach (HCC)    Past Surgical History:  Procedure Laterality Date   ABDOMINAL HYSTERECTOMY     APPENDECTOMY     CARDIAC CATHETERIZATION N/A 10/03/2015   Procedure: Left Heart Cath and Coronary Angiography;  Surgeon: Peter M Swaziland, MD;  Location: [facility] INVASIVE CV LAB;   Service: Cardiovascular;  Laterality: N/A;   CARDIAC CATHETERIZATION N/A 10/07/2015   Procedure: Coronary Stent Intervention;  Surgeon: Odie Benne, MD;  Location: [facility] INVASIVE CV LAB;  Service: Cardiovascular;  Laterality: N/A;   CARDIAC CATHETERIZATION  09/22/2019   EP IMPLANTABLE DEVICE N/A 01/20/2016   Procedure: BiV ICD Insertion CRT-D;  Surgeon: Tammie Fall, MD;  Location: [facility] INVASIVE CV LAB;  Service: Cardiovascular;  Laterality: N/A;   IR THORACENTESIS ASP PLEURAL SPACE W/IMG GUIDE  07/20/2023   IR THORACENTESIS ASP PLEURAL SPACE W/IMG GUIDE  12/02/2023   LEFT HEART CATH AND CORONARY ANGIOGRAPHY N/A 09/22/2019   Procedure: LEFT HEART CATH AND CORONARY ANGIOGRAPHY;  Surgeon: Swaziland, Peter M, MD;  Location: [facility] INVASIVE CV LAB;  Service: Cardiovascular;  Laterality: N/A;   TONSILLECTOMY     TONSILLECTOMY     TUBAL LIGATION     Social History:  reports that she has never smoked. She has never used smokeless tobacco. She reports that she does not drink alcohol and does not use drugs.  Allergies  Allergen Reactions   Flagyl [Metronidazole] Itching, Rash and Other (See Comments)    Welts, also   Penicillins Hives, Itching and Rash    Previously tolerated ancef  + rocephin    Sulfamethoxazole Rash   Dilaudid  [Hydromorphone  Hcl] Nausea And Vomiting   Meperidine Nausea And Vomiting   Propoxyphene     Other reaction(s): Unknown   Tape  Other (See Comments)    "THE PLASTIC, CLEAR TAPE CAN/DOES PULL OFF MY SKIN."   Doxycycline  Nausea Only, Rash and Other (See Comments)    Made her "feel terrible"   Lanolin Itching and Rash   Sulfa Antibiotics Rash    Family History  Problem Relation Age of Onset   Coronary artery disease Mother    Thyroid  disease Mother    Multiple myeloma Father    Heart attack Maternal Grandfather    Heart attack Paternal Grandfather     Prior to Admission medications   Medication Sig Start Date End Date Taking? Authorizing Provider  acetaminophen   (TYLENOL ) 500 MG tablet Take 1,000 mg by mouth every 6 (six) hours as needed for moderate pain (pain score 4-6).   Yes [provider]  allopurinol  (ZYLOPRIM ) 100 MG tablet Take 100 mg by mouth daily.  06/15/17  Yes [provider]  amiodarone  (PACERONE ) 200 MG tablet Take 1 tablet (200 mg total) by mouth at bedtime. 12/16/23  Yes Cala Castleman, MD  aspirin  EC 81 MG tablet Take 81 mg by mouth in the morning.   Yes [provider]  atorvastatin  (LIPITOR) 20 MG tablet Take 1 tablet (20 mg total) by mouth daily. 05/07/16  Yes Bensimhon, Rheta Celestine, MD  calcium  carbonate (OS-CAL - DOSED IN MG OF ELEMENTAL CALCIUM ) 1250 (500 Ca) MG tablet Take 1 tablet by mouth daily as needed (Calcium  levels).   Yes [provider]  Cholecalciferol  (VITAMIN D -3 PO) Take 5,000 Units by mouth daily.   Yes [provider]  Cyanocobalamin (VITAMIN B12 SL) Place 1 tablet under the tongue daily.   Yes [provider]  MAGNESIUM  GLYCINATE PO Take 1 tablet by mouth at bedtime.   Yes [provider]  nitroGLYCERIN  (NITROSTAT ) 0.4 MG SL tablet Place 1 tablet (0.4 mg total) under the tongue every 5 (five) minutes x 3 doses as needed for chest pain. 08/21/23  Yes Arrien, Curlee Doss, MD  potassium chloride  (KLOR-CON  M) 10 MEQ tablet Take 1 tablet (10 mEq total) by mouth as needed. Patient taking differently: Take 10 mEq by mouth daily. 12/20/23  Yes Elmyra Haggard, MD  thyroid  (ARMOUR) 15 MG tablet Take 15 mg by mouth daily before breakfast.    Yes [provider]  torsemide  (DEMADEX ) 20 MG tablet Take 1 tablet (20 mg total) by mouth daily. Patient taking differently: Take 20 mg by mouth See admin instructions. Take 20mg  (1 tablet) by mouth one day alternating with 20mg  (1 tablet) twice a day every other day. 09/13/23  Yes Elmyra Haggard, MD  aluminum hydroxide-magnesium  carbonate (GAVISCON) 95-358 MG/15ML SUSP Take 30 mLs by mouth as needed for indigestion or  heartburn. Patient not taking: Reported on 12/29/2023    [provider]  b complex vitamins capsule Take 1 capsule by mouth as needed. Patient not taking: Reported on 12/29/2023    [provider]  imatinib  (GLEEVEC ) 400 MG tablet Take 400 mg by mouth at bedtime. Patient not taking: Reported on 12/29/2023 02/16/17   [provider]  Multiple Vitamin (MULTIVITAMIN WITH MINERALS) TABS tablet Take 1 tablet by mouth daily. Patient not taking: Reported on 12/29/2023    [provider]  vitamin C  (ASCORBIC ACID ) 500 MG tablet Take 500 mg by mouth as needed. Patient not taking: Reported on 12/29/2023    [provider]    Physical Exam: Vitals:   12/29/23 1000 12/29/23 1200 12/29/23 1230 12/29/23 1248  BP: (!) 131/58 [name])  155/63 (!) 134/57   Pulse: (!) 57 62 (!) 59   Resp: 20 (!) 24 (!) 24   Temp:    98 F (36.7 C)  TempSrc:    Oral  SpO2: 92% 96% 96%   Weight:      Height:       General: Alert, oriented x3, resting comfortably in no acute distress Respiratory: Bibasliar rales; no wheezing Cardiovascular: Regular rate and rhythm w/o m/r/g  Data Reviewed:  Lab Results  Component Value Date   WBC 8.8 12/28/2023   HGB 8.9 (L) 12/28/2023   HCT 27.5 (L) 12/28/2023   MCV 97.9 12/28/2023   PLT 252 12/28/2023   Lab Results  Component Value Date   GLUCOSE 118 (H) 12/28/2023   CALCIUM  9.1 12/28/2023   NA 136 12/28/2023   K 3.9 12/28/2023   CO2 26 12/28/2023   CL 99 12/28/2023   BUN 31 (H) 12/28/2023   CREATININE 2.13 (H) 12/28/2023   Lab Results  Component Value Date   ALT 21 12/05/2023   AST 25 12/05/2023   ALKPHOS 46 12/05/2023   BILITOT 0.7 12/05/2023   Lab Results  Component Value Date   INR 1.1 08/19/2023   INR 1.5 (H) 08/09/2023   INR 1.4 (H) 06/16/2022    Radiology: DG Chest 2 View Result Date: 12/29/2023 CLINICAL DATA:  Chest pain EXAM: CHEST - 2 VIEW COMPARISON:  12/16/2023 FINDINGS: Cardiac shadow is stable.  Defibrillator is again noted. Previously seen left apical pneumothorax is no longer identified. No focal infiltrate or sizable effusion is seen. No bony abnormality is noted. IMPRESSION: No acute abnormality noted. Electronically Signed   By: Violeta Grey M.D.   On: 12/29/2023 00:09     Assessment and Plan: 56F h/o VT/VF arrest, HFpEF, HTN, CKD3, CML, lymphedema, and hypothyroidism p/w reported ICD shock on 5/16 and found to have HFpEF exacerbation.  ICD shock -EP following; apprec eval/recs  HFpEF exacerbation EF 40-45%in 11/2023 -Increase pta lasix  20mg  daily-->BID (pt reports only taking daily pta) -PTA amiodarone  200mg  daily per EP -Duonebs prn  Elevated D-dimer -F/u NM pulmonary perfusion study   Advance Care Planning:   Code Status: Full Code   Consults: EP  Family Communication: N/A  Severity of Illness: The appropriate patient status for this patient is INPATIENT. Inpatient status is judged to be reasonable and necessary in order to provide the required intensity of service to ensure the patient's safety. The patient's presenting symptoms, physical exam findings, and initial radiographic and laboratory data in the context of their chronic comorbidities is felt to place them at high risk for further clinical deterioration. Furthermore, it is not anticipated that the patient will be medically stable for discharge from the hospital within 2 midnights of admission.   * I certify that at the point of admission it is my clinical judgment that the patient will require inpatient hospital care spanning beyond 2 midnights from the point of admission due to high intensity of service, high risk for further deterioration and high frequency of surveillance required.*   ------- I spent 55 minutes reviewing previous labs/notes, obtaining separate history at the bedside, counseling/discussing the treatment plan outlined above, ordering medications/tests, and performing clinical  documentation.  Author: Arne Langdon, MD 12/29/2023 1:28 PM  For on call review [email or web address].

## 2023-12-29 NOTE — ED Notes (Signed)
 Nt called ccmd @ 6:52am

## 2023-12-29 NOTE — Consult Note (Addendum)
 ELECTROPHYSIOLOGY CONSULT NOTE    Patient ID: Lisa [name] MRN: [id number], DOB/AGE: 82/11/1940 82 y.o.  Admit date: 12/28/2023 Date of Consult: 12/29/2023  Primary Physician: Faustina Hood, MD Primary Cardiologist: Ola Berger, MD  Electrophysiologist: Dr. Carolynne Citron   Referring Provider: Dr. Sulema Endo  Patient Profile: Lisa [name] is a 83 y.o. female with a hx of hypertension, hypothyroidism, anemia, VT/VF arrest, heart failure, CML, obesity, pleural effusion with venous stasis, lymphedema who is being seen today for the evaluation of ICD Shock 5/16 at the request of Dr. Sulema Endo.  HPI:  Lisa [name] is a 83 y.o. female well known to EP team from recent admission.   Previously had VT treated with single shock, and recurrence 01/2023 treated with amiodarone .     Had been quiescent until admission 11/2023 with flank pain, found to have UTI and had VT with ICD shock x 3 in the ED. Course was further complicated by hydropneumothorax requiring thoracentesis and pigtail catheter which has since been removed.   Her amiodarone  was decreased to 200 mg daily on discharge.   Device alert received 5/20 for ICD shock that occurred 5/16. Pt was sleeping and was not aware of shock. Appointment with Dr. Carolynne Citron moved up, with alarm symptoms given. Pt continued to have dizziness and lightheadedness, though no further ICD shocks, and presented to ED for further evaluation.   Pertinent labs on admission as below, with mild AKI and low Mg. Device re-interrogated which showed no further VT/VF, but occasional NSVT noted on telemetry. EP asked to see for further recommendations.   Feeling OK this am at rest. Still having R sided pain. CXR stable. Having intermittent lightheadedness, not clear if correlates directly with occasional NSVT. Notes overall down trend in her functional status over past few months. Just feels "bad".   Labs Potassium3.9 (05/20 2336) Magnesium   1.5* (05/20 2336) Creatinine,  ser  2.13* (05/20 2336) PLT  252 (05/20 2336) HGB  8.9* (05/20 2336) WBC 8.8 (05/20 2336) Troponin I (High Sensitivity)14 (05/21 0127).        Physical Exam: Vitals:   12/29/23 0615 12/29/23 0630 12/29/23 0645 12/29/23 0655  BP: 139/65 (!) 141/73 (!) 140/65   Pulse: (!) 57 64 (!) 58   Resp: (!) 23 12 (!) 22   Temp:    97.9 F (36.6 C)  TempSrc:      SpO2: 96% 95% 93%   Weight:      Height:        GEN- NAD, A&O x 3, normal affect HEENT: Normocephalic, atraumatic Lungs- CTAB, Normal effort.  Heart- Regular rate and rhythm, No M/G/R.  GI- Soft, NT, ND.  Extremities- No clubbing, cyanosis, or edema   EKG:AS-VP at 65 bpm (personally reviewed)     TELEMETRY: SB/NSR 50-60s with occasional NSVT (personally reviewed)  DEVICE HISTORY: MDT BIV ICD implanted 01/2016 for CHF (ERI as of 10/23/23)  Assessment/Plan:  Recurrent VT/VF s/p ICD shock No further since 5/16. Had been instructed to keep office visit but report to ED if had further symptoms.  She has continued to have dizziness, but no further VT NSVT noted on tele Potassium3.9 (05/20 2336) Magnesium   1.5* (05/20 2336) Creatinine, ser  2.13* (05/20 2336) Keep K > 4.0 and Mg > 2.0  Increase amiodarone  to 200 mg BID and would not decrease.   CAD HFrEF Folow status looks OK.  Per primary.  HS trop flat  +D-dimer Flank pain H/o hydropneumothorax CXR OK, Perfusion scan pending Per primary  GOC Broached topic of the increased risk of gen change given her recent admissions and on-going issues. Appears to have failure to thrive with gradual downtrend over the past several months.  Further GOC discussions would be very reasonable.   Dr. Lawana Pray has seen. Jafeth Mustin observe on tele and Kelvin Sennett continue to plan gen change as scheduled for next week pending her coarse.   For questions or updates, please contact [facility] Please consult [email or web address] for contact info under Cardiology/STEMI.  Delorise Few, PA-C  12/29/2023 8:53 AM     I have seen and examined this patient with Michaelle Adolphus.  Agree with above, note added to reflect my findings.  Patient has a past history as above.  She received ICD shock this month on 12/24/2023.  She was sleeping and not aware of the shock.  She had a previous admission last month with flank pain and was found to have ICD shocks x 3 in the setting of a UTI.  She also had hydropneumothorax requiring thoracentesis and pigtail catheter placement.  Currently, she feels okay at rest.  She continues to have right-sided discomfort with intermittent lightheadedness.  She has occasional nonsustained VT.  GEN: No acute distress.   Neck: No JVD Cardiac: RRR, no murmurs, rubs, or gallops.  Respiratory: normal BS bases bilaterally. GI: Soft, nontender, non-distended  MS: No edema; No deformity. Neuro:  Nonfocal  Skin: warm and dry, device site well healed Psych: Normal affect    Recurrent VT/VF: Would increase amiodarone  to 200 mg twice daily.  She has a longstanding history of VT.  Would keep potassium greater than 4, magnesium  greater than 2.  Ellwood Steidle continue to monitor on telemetry as she is in the hospital. Chronic systolic heart failure: No obvious volume overload Coronary disease: No current chest pain.  Troponin not elevated   Keyanna Sandefer M. Nanea Jared MD 12/29/2023 10:52 AM

## 2023-12-29 NOTE — Consult Note (Signed)
 Cardiology Consultation   Patient ID: Lisa [name] MRN: [id number]; DOB: 1941/02/14  Admit date: 12/28/2023 Date of Consult: 12/29/2023  PCP:  Faustina Hood, MD   [facility] Providers Cardiologist:  Ola Berger, MD  Electrophysiologist:  Manya Sells, MD    History of Present Illness:  Lisa [name] is a 83 y.o. female with a hx of systolic HF, CAD s/p stent to LAD (09/2015), VT/VF s/p CRT-D ([employer]; 01/20/16), CKD 3b, HTN, thyroid  disease, CML, melanoma who is being seen 12/29/2023 for the evaluation of ICD shock at the request of the ER.  Patient was recently admitted from 4/26-12/16/23. She initially presented with right flank/back pain and was found to have a large left hydropneumothorax s/p chest tube placement. Her hospital course was c/b VT with frequent ICD shocks where she was ultimately started on amiodarone  IV on 4/26 and ultimately transitioned to PO amiodarone .   Patient presented to the ER today due to feeling weak and had episodes of dizziness. Patient's ICD fired for VF while in her sleep on 5/16 at 2159 with subsequent restoration of AS-BVP. Patient was unaware of the ICD shocks, but was instructed by her cardiology office to present to the ER with syncope/dizziness. She does also note pain on the right side of her chest that worsens with inspiration similar to her prior pain to her hydropneumothorax.   She reports adherence to her amiodarone  200mg  daily. Currently taking torsemide  20mg  daily and additional 20mg  daily prn if weights go up, but she has not used this as her weights have been downtrending. Reports taking magnesium  supplementation variably.   ER course:  Vitals: T 98.43F, HR 66, RR 18, BP 138/60, Sat 100% on RA,  Labs: WBC 8.8, Hgb 8.9, plt 252, Na 136, K 3.9, Cl 99, Glu 118, Bun 31, Cr 2.13 (baseline 1.8-1.9), Ca 9.1, mag 1.5, Trop 14 -> 15 Interventions: zofran  4mg  IV, hydrocodone-acetaminophen  5-325mg  x1, magnesium  2g IV    Past Medical  History:  Diagnosis Date   Acute on chronic systolic heart failure (HCC)    Acute renal failure superimposed on stage 3b chronic kidney disease (HCC) 05/22/2022   Bacteremia due to Pseudomonas 06/16/2022   CAD in native artery 2017   stent to LAD   Cardiac arrest (HCC) 09/2015   CHF (congestive heart failure) (HCC)    CML (chronic myelocytic leukemia) (HCC)    Complication of anesthesia    Hx: UTI (urinary tract infection)    Hypertension    Melanoma (HCC)    PONV (postoperative nausea and vomiting)    PVC (premature ventricular contraction)    HISTORY OF   Thyroid  disease    V tach (HCC)     Past Surgical History:  Procedure Laterality Date   ABDOMINAL HYSTERECTOMY     APPENDECTOMY     CARDIAC CATHETERIZATION N/A 10/03/2015   Procedure: Left Heart Cath and Coronary Angiography;  Surgeon: Peter M Swaziland, MD;  Location: [facility] INVASIVE CV LAB;  Service: Cardiovascular;  Laterality: N/A;   CARDIAC CATHETERIZATION N/A 10/07/2015   Procedure: Coronary Stent Intervention;  Surgeon: Odie Benne, MD;  Location: [facility] INVASIVE CV LAB;  Service: Cardiovascular;  Laterality: N/A;   CARDIAC CATHETERIZATION  09/22/2019   EP IMPLANTABLE DEVICE N/A 01/20/2016   Procedure: BiV ICD Insertion CRT-D;  Surgeon: Tammie Fall, MD;  Location: [facility] INVASIVE CV LAB;  Service: Cardiovascular;  Laterality: N/A;   IR THORACENTESIS ASP PLEURAL SPACE W/IMG GUIDE  07/20/2023   IR THORACENTESIS ASP  PLEURAL SPACE W/IMG GUIDE  12/02/2023   LEFT HEART CATH AND CORONARY ANGIOGRAPHY N/A 09/22/2019   Procedure: LEFT HEART CATH AND CORONARY ANGIOGRAPHY;  Surgeon: Swaziland, Peter M, MD;  Location: [facility] INVASIVE CV LAB;  Service: Cardiovascular;  Laterality: N/A;   TONSILLECTOMY     TONSILLECTOMY     TUBAL LIGATION       Home Medications:  Prior to Admission medications   Medication Sig Start Date End Date Taking? Authorizing Provider  acetaminophen  (TYLENOL ) 500 MG tablet Take 1,000 mg by mouth every 6 (six) hours  as needed for moderate pain (pain score 4-6).    [provider]  allopurinol  (ZYLOPRIM ) 100 MG tablet Take 100 mg by mouth daily.  06/15/17   [provider]  aluminum hydroxide-magnesium  carbonate (GAVISCON) 95-358 MG/15ML SUSP Take 30 mLs by mouth as needed for indigestion or heartburn.    [provider]  amiodarone  (PACERONE ) 200 MG tablet Take 1 tablet (200 mg total) by mouth at bedtime. 12/16/23   Cala Castleman, MD  aspirin  EC 81 MG tablet Take 81 mg by mouth in the morning.    [provider]  atorvastatin  (LIPITOR) 20 MG tablet Take 1 tablet (20 mg total) by mouth daily. 05/07/16   Bensimhon, Rheta Celestine, MD  b complex vitamins capsule Take 1 capsule by mouth as needed.    [provider]  calcium  carbonate (OS-CAL - DOSED IN MG OF ELEMENTAL CALCIUM ) 1250 (500 Ca) MG tablet Take 1 tablet by mouth daily as needed (Calcium  levels).    [provider]  Cholecalciferol  (VITAMIN D -3 PO) Take 5,000 Units by mouth daily.    [provider]  Cyanocobalamin (VITAMIN B12 SL) Place 1 tablet under the tongue daily.    [provider]  imatinib  (GLEEVEC ) 400 MG tablet Take 400 mg by mouth at bedtime. 02/16/17   [provider]  MAGNESIUM  GLYCINATE PO Take 1 tablet by mouth at bedtime.    [provider]  Multiple Vitamin (MULTIVITAMIN WITH MINERALS) TABS tablet Take 1 tablet by mouth daily.    [provider]  nitroGLYCERIN  (NITROSTAT ) 0.4 MG SL tablet Place 1 tablet (0.4 mg total) under the tongue every 5 (five) minutes x 3 doses as needed for chest pain. 08/21/23   Arrien, Curlee Doss, MD  potassium chloride  (KLOR-CON  M) 10 MEQ tablet Take 1 tablet (10 mEq total) by mouth as needed. 12/20/23   Elmyra Haggard, MD  thyroid  (ARMOUR) 15 MG tablet Take 15 mg by mouth daily before breakfast.     [provider]  torsemide  (DEMADEX ) 20 MG tablet Take 1 tablet (20 mg total) by mouth daily. Patient taking  differently: Take 20 mg by mouth See admin instructions. Take 20mg  (1 tablet) by mouth one day alternating with 20mg  (1 tablet) twice a day every other day. 09/13/23   Elmyra Haggard, MD  vitamin C  (ASCORBIC ACID ) 500 MG tablet Take 500 mg by mouth as needed.    [provider]    Inpatient Medications: Scheduled Meds:  Continuous Infusions:  magnesium  sulfate bolus IVPB 2 g (12/29/23 0311)   PRN Meds:   Allergies:    Allergies  Allergen Reactions   Flagyl [Metronidazole] Itching, Rash and Other (See Comments)    Welts, also   Penicillins Hives, Itching and Rash    Previously tolerated ancef  + rocephin    Sulfamethoxazole Rash   Dilaudid  [Hydromorphone  Hcl] Nausea And Vomiting   Meperidine Nausea And Vomiting   Propoxyphene  Other reaction(s): Unknown   Tape Other (See Comments)    "THE PLASTIC, CLEAR TAPE CAN/DOES PULL OFF MY SKIN."   Doxycycline  Nausea Only, Rash and Other (See Comments)    Made her "feel terrible"   Lanolin Itching and Rash   Sulfa Antibiotics Rash    Social History:   Social History   Socioeconomic History   Marital status: Divorced    Spouse name: Not on file   Number of children: Not on file   Years of education: Not on file   Highest education level: Not on file  Occupational History   Not on file  Tobacco Use   Smoking status: Never   Smokeless tobacco: Never  Vaping Use   Vaping status: Never Used  Substance and Sexual Activity   Alcohol use: No    Alcohol/week: 0.0 standard drinks of alcohol   Drug use: No   Sexual activity: Not on file  Other Topics Concern   Not on file  Social History Narrative   Not on file   Social Drivers of Health   Financial Resource Strain: Low Risk  (10/28/2022)   Received from [facility], [facility]   Overall Financial Resource Strain (CARDIA)    Difficulty of Paying Living Expenses: Not hard at all  Food Insecurity: No Food Insecurity (12/07/2023)   Hunger Vital Sign    Worried About  Running Out of Food in the Last Year: Never true    Ran Out of Food in the Last Year: Never true  Transportation Needs: No Transportation Needs (12/07/2023)   PRAPARE - Administrator, Civil Service (Medical): No    Lack of Transportation (Non-Medical): No  Physical Activity: Not on file  Stress: Not on file  Social Connections: Moderately Isolated (12/07/2023)   Social Connection and Isolation Panel [NHANES]    Frequency of Communication with Friends and Family: More than three times a week    Frequency of Social Gatherings with Friends and Family: Twice a week    Attends Religious Services: Never    Database administrator or Organizations: Yes    Attends Engineer, structural: More than 4 times per year    Marital Status: Divorced  Intimate Partner Violence: Not At Risk (12/07/2023)   Humiliation, Afraid, Rape, and Kick questionnaire    Fear of Current or Ex-Partner: No    Emotionally Abused: No    Physically Abused: No    Sexually Abused: No    Family History:   Family History  Problem Relation Age of Onset   Coronary artery disease Mother    Thyroid  disease Mother    Multiple myeloma Father    Heart attack Maternal Grandfather    Heart attack Paternal Grandfather      ROS:  Please see the history of present illness.  All other ROS reviewed and negative.     Physical Exam/Data:   Vitals:   12/28/23 2331 12/28/23 2333 12/28/23 2334 12/29/23 0245  BP: 138/60   (!) 132/54  Pulse: 66   61  Resp: 18   (!) 22  Temp:  98.1 F (36.7 C)  98 F (36.7 C)  TempSrc:  Oral    SpO2: 100%   97%  Weight:   66.3 kg   Height:   5\' 1"  (1.549 m)    No intake or output data in the 24 hours ending 12/29/23 0400    12/28/2023   11:34 PM 12/20/2023   10:48 AM 12/16/2023  5:00 AM  Last 3 Weights  Weight (lbs) 146 lb 3.2 oz 150 lb 161 lb 6 oz  Weight (kg) 66.316 kg 68.04 kg 73.2 kg     Body mass index is 27.62 kg/m.  General:  Well nourished, well developed, in  no acute distress HEENT: normal Neck: no JVD Vascular: No carotid bruits; Distal pulses 2+ bilaterally Cardiac:  normal S1, S2; RRR; no murmur  Lungs:  clear to auscultation bilaterally, no wheezing, rhonchi or rales  Abd: soft, nontender, no hepatomegaly  Ext: 1+ bilateral LE edema  Musculoskeletal:  No deformities, BUE and BLE strength normal and equal Skin: warm and dry  Neuro:  no focal abnormalities noted Psych:  Normal affect   Relevant CV Studies: Device interrogation 12/28/23       TTE 12/05/23  1. Left ventricular ejection fraction, by estimation, is 40 to 45%. The left ventricle has mildly decreased function. The left ventricle has no regional wall motion abnormalities. Left ventricular diastolic parameters are consistent with Grade I  diastolic dysfunction (impaired relaxation).   2. Right ventricular systolic function is normal. The right ventricular size is normal.   3. The mitral valve is normal in structure. No evidence of mitral valve regurgitation. No evidence of mitral stenosis.   4. The aortic valve is normal in structure. Aortic valve regurgitation is not visualized. No aortic stenosis is present.   5. The inferior vena cava is normal in size with greater than 50% respiratory variability, suggesting right atrial pressure of 3 mmHg.  Laboratory Data:  High Sensitivity Troponin:   Recent Labs  Lab 12/04/23 2130 12/05/23 0247 12/05/23 2132 12/28/23 2336 12/29/23 0127  TROPONINIHS 114* 87* 31* 15 14     Chemistry Recent Labs  Lab 12/28/23 2336  NA 136  K 3.9  CL 99  CO2 26  GLUCOSE 118*  BUN 31*  CREATININE 2.13*  CALCIUM  9.1  MG 1.5*  GFRNONAA 23*  ANIONGAP 11    No results for input(s): "PROT", "ALBUMIN ", "AST", "ALT", "ALKPHOS", "BILITOT" in the last 168 hours. Lipids No results for input(s): "CHOL", "TRIG", "HDL", "LABVLDL", "LDLCALC", "CHOLHDL" in the last 168 hours.  Hematology Recent Labs  Lab 12/28/23 2336  WBC 8.8  RBC 2.81*  HGB  8.9*  HCT 27.5*  MCV 97.9  MCH 31.7  MCHC 32.4  RDW 15.4  PLT 252   Thyroid  No results for input(s): "TSH", "FREET4" in the last 168 hours.  BNPNo results for input(s): "BNP", "PROBNP" in the last 168 hours.  DDimer  Recent Labs  Lab 12/29/23 0255  DDIMER 8.17*     Radiology/Studies:  DG Chest 2 View Result Date: 12/29/2023 CLINICAL DATA:  Chest pain EXAM: CHEST - 2 VIEW COMPARISON:  12/16/2023 FINDINGS: Cardiac shadow is stable. Defibrillator is again noted. Previously seen left apical pneumothorax is no longer identified. No focal infiltrate or sizable effusion is seen. No bony abnormality is noted. IMPRESSION: No acute abnormality noted. Electronically Signed   By: Violeta Grey M.D.   On: 12/29/2023 00:09     Assessment and Plan:   #Recurrent VT/VF s/p ICD shock  #Hypomagnesemia Patient was recently admitted from 4/26-12/16/23 and was found to have a left hydropneumothorax s/p chest tube placement with her hospital course c/b VT s/p ICD shock x2. She was started on amiodarone  and transitioned to amiodarone  200mg  PO daily. She presents today with weakness and episodes of dizziness and was found to have her ICD fired for VF while in her sleep on 5/16  at 2159 with subsequent restoration of AS-BVP. Mag levels were 1.5. Will plan to uptitrate her amiodarone  to VT/VF dosing to 400mg  daily and replete magnesium ; could consider initiation of low-dose bb as well.  --increase amiodarone  to 400mg  every day --consider initiation of low dose metoprolol  --replete mag IV; start po mag 400mg  bid  --continue telemetry   #CAD #HFrEF Patient with a hx of CAD, HFrEF (EF 40-45% on 12/05/23) presenting with fatigue. Unclear if this is related to her CML. She is mildly volume overloaded peripherally though does not have any evidence of pulmonary edema on CXR.  --continue asa 81mg  every day --continue atorvastatin  20mg  every day --continue home torsemide  20mg  once daily alternating with 20mg  bid every  other day --previously did not tolerate lisinopril /spironolactone  due to hyperkalemia --consider initiation of SGLT2i  --f/u BNP   Risk Assessment/Risk Scores:   New [place]  Heart Association (NYHA) Functional Class NYHA Class II   For questions or updates, please contact [facility] Please consult [email or web address] for contact info under    Signed, Milbert Alice, MD  12/29/2023 4:00 AM

## 2023-12-29 NOTE — ED Provider Notes (Signed)
 [facility] Provider Note   CSN: [id number] Arrival date & time: 12/28/23  2327     History  Chief Complaint  Patient presents with   Chest Pain    Lisa [name] is a 83 y.o. female.  The history is provided by the patient.  Chest Pain She is history of hypertension, chronic systolic heart failure, chronic myelocytic leukemia, coronary artery disease, ventricular tachycardia status post AICD placement, chronic kidney disease and comes in because of feeling weak and episodes of dizziness.  She states that 4 days ago, her defibrillator went off twice in her sleep.  She was not aware of it.  She was called by her cardiology office and told to come into the office tomorrow but to go to the ED if she had episodes of dizziness or if she passed out.  She states that she had been feeling generally weak for the last several days but actually was feeling somewhat better today.  This evening, she did start to develop some lightheadedness.  She also has developed a pain on the right side of her chest which is worse when she takes a deep breath.  Of note, she did have a recent hospitalization where she had a chest tube because of a hydropneumothorax.   Home Medications Prior to Admission medications   Medication Sig Start Date End Date Taking? Authorizing Provider  acetaminophen  (TYLENOL ) 500 MG tablet Take 1,000 mg by mouth every 6 (six) hours as needed for moderate pain (pain score 4-6).    [provider]  allopurinol  (ZYLOPRIM ) 100 MG tablet Take 100 mg by mouth daily.  06/15/17   [provider]  aluminum hydroxide-magnesium  carbonate (GAVISCON) 95-358 MG/15ML SUSP Take 30 mLs by mouth as needed for indigestion or heartburn.    [provider]  amiodarone  (PACERONE ) 200 MG tablet Take 1 tablet (200 mg total) by mouth at bedtime. 12/16/23   Cala Castleman, MD  aspirin  EC 81 MG tablet Take 81 mg by mouth in the morning.     [provider]  atorvastatin  (LIPITOR) 20 MG tablet Take 1 tablet (20 mg total) by mouth daily. 05/07/16   Bensimhon, Rheta Celestine, MD  b complex vitamins capsule Take 1 capsule by mouth as needed.    [provider]  calcium  carbonate (OS-CAL - DOSED IN MG OF ELEMENTAL CALCIUM ) 1250 (500 Ca) MG tablet Take 1 tablet by mouth daily as needed (Calcium  levels).    [provider]  Cholecalciferol  (VITAMIN D -3 PO) Take 5,000 Units by mouth daily.    [provider]  Cyanocobalamin (VITAMIN B12 SL) Place 1 tablet under the tongue daily.    [provider]  imatinib  (GLEEVEC ) 400 MG tablet Take 400 mg by mouth at bedtime. 02/16/17   [provider]  MAGNESIUM  GLYCINATE PO Take 1 tablet by mouth at bedtime.    [provider]  Multiple Vitamin (MULTIVITAMIN WITH MINERALS) TABS tablet Take 1 tablet by mouth daily.    [provider]  nitroGLYCERIN  (NITROSTAT ) 0.4 MG SL tablet Place 1 tablet (0.4 mg total) under the tongue every 5 (five) minutes x 3 doses as needed for chest pain. 08/21/23   Arrien, Mauricio Daniel, MD  potassium chloride  (KLOR-CON  M) 10 MEQ tablet Take 1 tablet (10 mEq total) by mouth as needed. 12/20/23   Elmyra Haggard, MD  thyroid  (ARMOUR) 15 MG tablet Take 15 mg by mouth daily before breakfast.  [provider]  torsemide  (DEMADEX ) 20 MG tablet Take 1 tablet (20 mg total) by mouth daily. Patient taking differently: Take 20 mg by mouth See admin instructions. Take 20mg  (1 tablet) by mouth one day alternating with 20mg  (1 tablet) twice a day every other day. 09/13/23   Elmyra Haggard, MD  vitamin C  (ASCORBIC ACID ) 500 MG tablet Take 500 mg by mouth as needed.    [provider]      Allergies    Flagyl [metronidazole], Penicillins, Sulfamethoxazole, Dilaudid  [hydromorphone  hcl], Meperidine, Propoxyphene, Tape, Doxycycline , Lanolin, and Sulfa antibiotics    Review of Systems   Review of Systems   Cardiovascular:  Positive for chest pain.  All other systems reviewed and are negative.   Physical Exam Updated Vital Signs BP (!) 126/58   Pulse 60   Temp 98 F (36.7 C)   Resp 20   Ht 5\' 1"  (1.549 m)   Wt 66.3 kg   LMP 02/14/1995   SpO2 93%   BMI 27.62 kg/m  Physical Exam Vitals and nursing note reviewed.   83 year old female, resting comfortably and in no acute distress. Vital signs are normal. Oxygen saturation is 100%, which is normal. Head is normocephalic and atraumatic. PERRLA, EOMI. Oropharynx is clear.  She is generally pale and conjunctivae are pale. Neck is nontender and supple without adenopathy or JVD. Lungs are clear without rales, wheezes, or rhonchi. Chest is tender in the right lateral rib cage.  There is no crepitus.  This does reproduce her pain. Heart has regular rate and rhythm without murmur. Abdomen is soft, flat, nontender. Extremities have 3+ lymphedema which patient states is actually improved compared with baseline. Skin is warm and dry without rash. Neurologic: Mental status is normal, cranial nerves are intact, moves all extremities equally.  ED Results / Procedures / Treatments   Labs (all labs ordered are listed, but only abnormal results are displayed) Labs Reviewed  BASIC METABOLIC PANEL WITH GFR - Abnormal; Notable for the following components:      Result Value   Glucose, Bld 118 (*)    BUN 31 (*)    Creatinine, Ser 2.13 (*)    GFR, Estimated 23 (*)    All other components within normal limits  CBC - Abnormal; Notable for the following components:   RBC 2.81 (*)    Hemoglobin 8.9 (*)    HCT 27.5 (*)    All other components within normal limits  MAGNESIUM  - Abnormal; Notable for the following components:   Magnesium  1.5 (*)    All other components within normal limits  D-DIMER, QUANTITATIVE - Abnormal; Notable for the following components:   D-Dimer, Quant 8.17 (*)    All other components within normal limits  BRAIN NATRIURETIC  PEPTIDE  TROPONIN I (HIGH SENSITIVITY)  TROPONIN I (HIGH SENSITIVITY)    EKG EKG Interpretation Date/Time:  Tuesday Dec 28 2023 23:40:56 EDT Ventricular Rate:  65 PR Interval:    QRS Duration:  84 QT Interval:  454 QTC Calculation: 472 R Axis:   -56  Text Interpretation: Atrial-sensed ventricular-paced rhythm Abnormal ECG When compared with ECG of 05-Dec-2023 21:00, No significant change was found Confirmed by Alissa April ([id number]) on 12/29/2023 12:14:11 AM  Radiology DG Chest 2 View Result Date: 12/29/2023 CLINICAL DATA:  Chest pain EXAM: CHEST - 2 VIEW COMPARISON:  12/16/2023 FINDINGS: Cardiac shadow is stable. Defibrillator is again noted. Previously seen left apical pneumothorax is no longer identified. No focal infiltrate or sizable  effusion is seen. No bony abnormality is noted. IMPRESSION: No acute abnormality noted. Electronically Signed   By: Violeta Grey M.D.   On: 12/29/2023 00:09    Procedures Procedures  Cardiac monitor shows atrial sensing ventricular paced rhythm per my interpretation.  Medications Ordered in ED Medications  lidocaine  (LIDODERM ) 5 % 1 patch (1 patch Transdermal Patch Applied 12/29/23 0535)  acetaminophen  (TYLENOL ) tablet 1,000 mg (1,000 mg Oral Given 12/29/23 0533)  magnesium  sulfate IVPB 2 g 50 mL (0 g Intravenous Stopped 12/29/23 0411)  HYDROcodone-acetaminophen  (NORCO/VICODIN) 5-325 MG per tablet 1 tablet (1 tablet Oral Given 12/29/23 0307)  ondansetron  (ZOFRAN ) injection 4 mg (4 mg Intravenous Given 12/29/23 0307)    ED Course/ Medical Decision Making/ A&P                                 Medical Decision Making Amount and/or Complexity of Data Reviewed Labs: ordered. Radiology: ordered.  Risk Prescription drug management. Decision regarding hospitalization.   Weakness and dizziness and chest pain and patient with AICD and recent AICD firing.  This presentation which entails a wide range of treatment options and has considerable risk for  morbidity and complications.  Differential diagnosis includes, but is not limited to, ACS, unstable angina, arrhythmia, worsening anemia, electrolyte disturbance.  I have reviewed her electrocardiogram, and my interpretation is atrial sensed ventricular paced rhythm unchanged from prior.  Chest x-ray shows no acute cardiopulmonary process.  I independently viewed the images, and agree with the radiologist's interpretation.  Labs are pending.  I have reviewed her past records and note hospitalization 12/04/2023-12/16/2023 with left hydropneumothorax requiring chest tube placement.  Baseline hemoglobin is 8-9, baseline creatinine is 1.8.  In addition to screening labs, I have ordered interrogation of her pacemaker/defibrillator.  I do note phone call from cardiology office stating she received 1 shock for ventricular fibrillation event on 12/24/2023, also 2 shocks on 12/04/2023.  I have reviewed her laboratory tests and my interpretation is elevated creatinine slightly worse than baseline, stable hemoglobin, normal troponin x 2, low magnesium .  I have ordered some intravenous magnesium .  She is continuing to complain of pain in her right side when she takes a deep breath.  I have ordered a D-dimer to rule out pulmonary embolism.  If positive, she will need a V/Q scan since her creatinine is too high for her to have CT angiogram.  I have requested cardiology consultation.  Results of defibrillator interrogation are still pending.  I received a phone call regarding the interrogation of the defibrillator, and she did have a 12 beat run of ventricular tachycardia today which was not treated, a 30 second ventricular arrhythmia on May 16 which was terminated with a shock from the defibrillator.  Also, her AICD battery is at the end of its useful life and will need to be replaced soon.  D-dimer is markedly elevated, but renal function is too low to allow for CT angiogram.  She will need V/Q. scan.  Cardiology consultation  is appreciated.  Recommendation is for increasing dose of amiodarone  and observed in the hospital.  V/q. scan is still pending.  I have discussed the case with Dr. [facility] hospitalist, who agrees to admit the patient.  Final Clinical Impression(s) / ED Diagnoses Final diagnoses:  Ventricular tachycardia (HCC)  Weakness  Normochromic normocytic anemia  Renal insufficiency  Elevated d-dimer  AICD at end of battery life  CML (chronic  myelocytic leukemia) [facility])    Rx / DC Orders ED Discharge Orders     None         Alissa April, MD 12/29/23 [phone]

## 2023-12-29 NOTE — ED Notes (Signed)
 Phleb stuck twice, didn't get any blood.

## 2023-12-29 NOTE — ED Notes (Signed)
 Patient taken to the bathroom in wheelchair with one assist

## 2023-12-30 ENCOUNTER — Ambulatory Visit: Admitting: Physician Assistant

## 2023-12-30 ENCOUNTER — Encounter: Payer: Self-pay | Admitting: Internal Medicine

## 2023-12-30 ENCOUNTER — Encounter (HOSPITAL_COMMUNITY): Payer: Self-pay | Admitting: Internal Medicine

## 2023-12-30 ENCOUNTER — Observation Stay (HOSPITAL_COMMUNITY)

## 2023-12-30 DIAGNOSIS — R609 Edema, unspecified: Secondary | ICD-10-CM | POA: Diagnosis not present

## 2023-12-30 DIAGNOSIS — I5023 Acute on chronic systolic (congestive) heart failure: Secondary | ICD-10-CM | POA: Diagnosis present

## 2023-12-30 DIAGNOSIS — Z955 Presence of coronary angioplasty implant and graft: Secondary | ICD-10-CM | POA: Diagnosis not present

## 2023-12-30 DIAGNOSIS — E669 Obesity, unspecified: Secondary | ICD-10-CM | POA: Diagnosis present

## 2023-12-30 DIAGNOSIS — Z8249 Family history of ischemic heart disease and other diseases of the circulatory system: Secondary | ICD-10-CM | POA: Diagnosis not present

## 2023-12-30 DIAGNOSIS — Z4502 Encounter for adjustment and management of automatic implantable cardiac defibrillator: Secondary | ICD-10-CM | POA: Diagnosis not present

## 2023-12-30 DIAGNOSIS — I442 Atrioventricular block, complete: Secondary | ICD-10-CM | POA: Diagnosis present

## 2023-12-30 DIAGNOSIS — Z7982 Long term (current) use of aspirin: Secondary | ICD-10-CM | POA: Diagnosis not present

## 2023-12-30 DIAGNOSIS — J948 Other specified pleural conditions: Secondary | ICD-10-CM | POA: Diagnosis present

## 2023-12-30 DIAGNOSIS — K5900 Constipation, unspecified: Secondary | ICD-10-CM | POA: Diagnosis present

## 2023-12-30 DIAGNOSIS — Z882 Allergy status to sulfonamides status: Secondary | ICD-10-CM | POA: Diagnosis not present

## 2023-12-30 DIAGNOSIS — E039 Hypothyroidism, unspecified: Secondary | ICD-10-CM | POA: Diagnosis present

## 2023-12-30 DIAGNOSIS — I472 Ventricular tachycardia, unspecified: Secondary | ICD-10-CM | POA: Diagnosis present

## 2023-12-30 DIAGNOSIS — I130 Hypertensive heart and chronic kidney disease with heart failure and stage 1 through stage 4 chronic kidney disease, or unspecified chronic kidney disease: Secondary | ICD-10-CM | POA: Diagnosis present

## 2023-12-30 DIAGNOSIS — N179 Acute kidney failure, unspecified: Secondary | ICD-10-CM | POA: Diagnosis present

## 2023-12-30 DIAGNOSIS — C9110 Chronic lymphocytic leukemia of B-cell type not having achieved remission: Secondary | ICD-10-CM | POA: Diagnosis present

## 2023-12-30 DIAGNOSIS — R079 Chest pain, unspecified: Secondary | ICD-10-CM | POA: Diagnosis present

## 2023-12-30 DIAGNOSIS — Z95810 Presence of automatic (implantable) cardiac defibrillator: Secondary | ICD-10-CM | POA: Diagnosis not present

## 2023-12-30 DIAGNOSIS — R627 Adult failure to thrive: Secondary | ICD-10-CM | POA: Diagnosis present

## 2023-12-30 DIAGNOSIS — D631 Anemia in chronic kidney disease: Secondary | ICD-10-CM | POA: Diagnosis present

## 2023-12-30 DIAGNOSIS — I4901 Ventricular fibrillation: Secondary | ICD-10-CM | POA: Diagnosis present

## 2023-12-30 DIAGNOSIS — Z85820 Personal history of malignant melanoma of skin: Secondary | ICD-10-CM | POA: Diagnosis not present

## 2023-12-30 DIAGNOSIS — C9210 Chronic myeloid leukemia, BCR/ABL-positive, not having achieved remission: Secondary | ICD-10-CM | POA: Diagnosis present

## 2023-12-30 DIAGNOSIS — Z79899 Other long term (current) drug therapy: Secondary | ICD-10-CM | POA: Diagnosis not present

## 2023-12-30 DIAGNOSIS — I2510 Atherosclerotic heart disease of native coronary artery without angina pectoris: Secondary | ICD-10-CM | POA: Diagnosis present

## 2023-12-30 DIAGNOSIS — N1832 Chronic kidney disease, stage 3b: Secondary | ICD-10-CM | POA: Diagnosis present

## 2023-12-30 DIAGNOSIS — L89152 Pressure ulcer of sacral region, stage 2: Secondary | ICD-10-CM | POA: Diagnosis present

## 2023-12-30 LAB — BASIC METABOLIC PANEL WITH GFR
Anion gap: 6 (ref 5–15)
BUN: 27 mg/dL — ABNORMAL HIGH (ref 8–23)
CO2: 30 mmol/L (ref 22–32)
Calcium: 8.4 mg/dL — ABNORMAL LOW (ref 8.9–10.3)
Chloride: 101 mmol/L (ref 98–111)
Creatinine, Ser: 1.59 mg/dL — ABNORMAL HIGH (ref 0.44–1.00)
GFR, Estimated: 32 mL/min — ABNORMAL LOW (ref >60)
Glucose, Bld: 105 mg/dL — ABNORMAL HIGH (ref 70–99)
Potassium: 3.9 mmol/L (ref 3.5–5.1)
Sodium: 137 mmol/L (ref 135–145)

## 2023-12-30 LAB — MAGNESIUM: Magnesium: 2.5 mg/dL — ABNORMAL HIGH (ref 1.7–2.4)

## 2023-12-30 MED ORDER — IOHEXOL 350 MG/ML SOLN
60.0000 mL | Freq: Once | INTRAVENOUS | Status: AC | PRN
  Administered 2023-12-30: 60 mL via INTRAVENOUS

## 2023-12-30 MED ORDER — SODIUM CHLORIDE 0.9 % IV SOLN: INTRAVENOUS | Status: AC

## 2023-12-30 MED ORDER — HEPARIN BOLUS VIA INFUSION
3000.0000 [IU] | Freq: Once | INTRAVENOUS | Status: AC
  Administered 2023-12-30: 3000 [IU] via INTRAVENOUS
  Filled 2023-12-30: qty 3000

## 2023-12-30 MED ORDER — TIMOLOL MALEATE 0.5 % OP SOLN
1.0000 [drp] | Freq: Two times a day (BID) | OPHTHALMIC | Status: DC
  Administered 2023-12-30 – 2024-01-08 (×18): 1 [drp] via OPHTHALMIC
  Filled 2023-12-30 (×2): qty 5

## 2023-12-30 MED ORDER — HEPARIN BOLUS VIA INFUSION
2000.0000 [IU] | Freq: Once | INTRAVENOUS | Status: DC
  Filled 2023-12-30: qty 2000

## 2023-12-30 MED ORDER — AMIODARONE HCL 200 MG PO TABS
200.0000 mg | ORAL_TABLET | Freq: Two times a day (BID) | ORAL | Status: DC
  Administered 2023-12-30 – 2024-01-08 (×19): 200 mg via ORAL
  Filled 2023-12-30 (×19): qty 1

## 2023-12-30 MED ORDER — HEPARIN (PORCINE) 25000 UT/250ML-% IV SOLN
950.0000 [IU]/h | INTRAVENOUS | Status: AC
  Administered 2023-12-30: 1050 [IU]/h via INTRAVENOUS
  Administered 2023-12-31 – 2024-01-05 (×6): 950 [IU]/h via INTRAVENOUS
  Filled 2023-12-30 (×7): qty 250

## 2023-12-30 NOTE — Plan of Care (Signed)
   Problem: Health Behavior/Discharge Planning: Goal: Ability to manage health-related needs will improve Outcome: Progressing   Problem: Clinical Measurements: Goal: Ability to maintain clinical measurements within normal limits will improve Outcome: Progressing Goal: Will remain free from infection Outcome: Progressing Goal: Diagnostic test results will improve Outcome: Progressing

## 2023-12-30 NOTE — TOC CM/SW Note (Addendum)
 Transition of Care [facility]) - Inpatient Brief Assessment   Patient Details  Name: Lisa [name] MRN: [id number] Date of Birth: [birth year]  Transition of Care [facility]) CM/SW Contact:    Jennett Model, RN Phone Number: 12/30/2023, 1:17 PM   Clinical Narrative: From home alone, has PCP , Rogue Clear at [facility] and insurance on file, but she does not have prescription coverage, she pays for all her meds, states has  [employer] duty aide in place from 10am to 2 pm 4 days a week and a Programmer, applications one day a week. Has walkers and cane and bsc  and grab bars at home.  States will call a uber to transport them home at [employer] and family is support system,  Daughter lives in [place], states gets medications from [facility] [address] and [address].  Pta self ambulatory  with walker and [employer]. She will contact her aide to let her know when she is being discharged.    Transition of Care Asessment: Insurance and Status: Insurance coverage has been reviewed Patient has primary care physician: Yes Home environment has been reviewed: home alone Prior level of function:: ambulatory with walker/cane Prior/Current Home Services: Current home services (walker, 3 canes, grabl bars, 3 bsc) Social Drivers of Health Review: SDOH reviewed no interventions necessary Readmission risk has been reviewed: Yes Transition of care needs: transition of care needs identified, TOC will continue to follow

## 2023-12-30 NOTE — Progress Notes (Signed)
 Heart Failure Navigator Progress Note  Assessed for Heart & Vascular TOC clinic readiness.   Patient with EF 40-45%, here with ICD shocks. Previous Dr. Julane Ny patient and graduated from [facility] in [facility]. No HF TOC per Dr. Drexel Gentles at this time.   Navigator available for reassessment of patient.   Jerilyn Monte, PharmD, BCPS Heart Failure Stewardship Pharmacist Phone [phone]

## 2023-12-30 NOTE — Progress Notes (Signed)
   Telemetry remains stable.   Note plans for CTA today with indeterminate Perfusion scan.   EP is available as needed. Would continue amiodarone  200 mg BID and plan to keep scheduled gen change next week.   Bambi Lever [address]" [place], PA-C  12/30/2023 2:25 PM

## 2023-12-30 NOTE — Plan of Care (Signed)

## 2023-12-30 NOTE — Progress Notes (Signed)
 PHARMACY - ANTICOAGULATION CONSULT NOTE  Pharmacy Consult for Heparin  Indication: pulmonary embolus  Allergies  Allergen Reactions   Flagyl [Metronidazole] Itching, Rash and Other (See Comments)    Welts, also   Penicillins Hives, Itching and Rash    Previously tolerated ancef  + rocephin    Sulfamethoxazole Rash   Dilaudid  [Hydromorphone  Hcl] Nausea And Vomiting   Meperidine Nausea And Vomiting   Propoxyphene     Other reaction(s): Unknown   Tape Other (See Comments)    "THE PLASTIC, CLEAR TAPE CAN/DOES PULL OFF MY SKIN."   Doxycycline  Nausea Only, Rash and Other (See Comments)    Made her "feel terrible"   Lanolin Itching and Rash   Sulfa Antibiotics Rash    Patient Measurements: Height: 5\' 1"  (154.9 cm) Weight: 65.7 kg (144 lb 13.5 oz) IBW/kg (Calculated) : 47.8 HEPARIN  DW (KG): 61.9  Vital Signs: Temp: 97.8 F (36.6 C) (05/22 0800) Temp Source: Axillary (05/22 0800) BP: 131/53 (05/22 1104) Pulse Rate: 70 (05/22 1104)  Labs: Recent Labs    12/28/23 2336 12/29/23 0127 12/29/23 2043 12/30/23 0228  HGB 8.9*  --  9.2*  --   HCT 27.5*  --  29.4*  --   PLT 252  --  252  --   CREATININE 2.13*  --  1.68* 1.59*  TROPONINIHS 15 14  --   --     Estimated Creatinine Clearance: 24.1 mL/min (A) (by C-G formula based on SCr of 1.59 mg/dL (H)).   Medical History: Past Medical History:  Diagnosis Date   Acute on chronic systolic heart failure (HCC)    Acute renal failure superimposed on stage 3b chronic kidney disease (HCC) 05/22/2022   Bacteremia due to Pseudomonas 06/16/2022   CAD in native artery 2017   stent to LAD   Cardiac arrest (HCC) 09/2015   CHF (congestive heart failure) (HCC)    CML (chronic myelocytic leukemia) (HCC)    Complication of anesthesia    Hx: UTI (urinary tract infection)    Hypertension    Melanoma (HCC)    PONV (postoperative nausea and vomiting)    PVC (premature ventricular contraction)    HISTORY OF   Thyroid  disease    V tach  (HCC)     Medications:  Medications Prior to Admission  Medication Sig Dispense Refill Last Dose/Taking   acetaminophen  (TYLENOL ) 500 MG tablet Take 1,000 mg by mouth every 6 (six) hours as needed for moderate pain (pain score 4-6).   Unknown   allopurinol  (ZYLOPRIM ) 100 MG tablet Take 100 mg by mouth daily.    12/28/2023   amiodarone  (PACERONE ) 200 MG tablet Take 1 tablet (200 mg total) by mouth at bedtime. 30 tablet 0 12/28/2023   aspirin  EC 81 MG tablet Take 81 mg by mouth in the morning.   12/28/2023   atorvastatin  (LIPITOR) 20 MG tablet Take 1 tablet (20 mg total) by mouth daily. 90 tablet 3 12/28/2023   calcium  carbonate (OS-CAL - DOSED IN MG OF ELEMENTAL CALCIUM ) 1250 (500 Ca) MG tablet Take 1 tablet by mouth daily as needed (Calcium  levels).   Past Month   Cholecalciferol  (VITAMIN D -3 PO) Take 5,000 Units by mouth daily.   Past Week   Cyanocobalamin (VITAMIN B12 SL) Place 1 tablet under the tongue daily.   Past Week   MAGNESIUM  GLYCINATE PO Take 1 tablet by mouth at bedtime.   12/28/2023   nitroGLYCERIN  (NITROSTAT ) 0.4 MG SL tablet Place 1 tablet (0.4 mg total) under the tongue every 5 (five)  minutes x 3 doses as needed for chest pain. 25 tablet 0 Taking As Needed   potassium chloride  (KLOR-CON  M) 10 MEQ tablet Take 1 tablet (10 mEq total) by mouth as needed. (Patient taking differently: Take 10 mEq by mouth daily.) 180 tablet 3 12/28/2023   thyroid  (ARMOUR) 15 MG tablet Take 15 mg by mouth daily before breakfast.    12/28/2023   torsemide  (DEMADEX ) 20 MG tablet Take 1 tablet (20 mg total) by mouth daily. (Patient taking differently: Take 20 mg by mouth See admin instructions. Take 20mg  (1 tablet) by mouth one day alternating with 20mg  (1 tablet) twice a day every other day.) 60 tablet 0 12/28/2023   aluminum hydroxide-magnesium  carbonate (GAVISCON) 95-358 MG/15ML SUSP Take 30 mLs by mouth as needed for indigestion or heartburn. (Patient not taking: Reported on 12/29/2023)   Not Taking   b complex  vitamins capsule Take 1 capsule by mouth as needed. (Patient not taking: Reported on 12/29/2023)   Not Taking   imatinib  (GLEEVEC ) 400 MG tablet Take 400 mg by mouth at bedtime. (Patient not taking: Reported on 12/29/2023)   Not Taking   Multiple Vitamin (MULTIVITAMIN WITH MINERALS) TABS tablet Take 1 tablet by mouth daily. (Patient not taking: Reported on 12/29/2023)   Not Taking   vitamin C  (ASCORBIC ACID ) 500 MG tablet Take 500 mg by mouth as needed. (Patient not taking: Reported on 12/29/2023)   Not Taking    Assessment: 83 y.o. F presents with recurrent VT/VF, s/p ICD shock. Found to have right lower lobe segmental and subsegmental pulmonary artery emboli. No evidence of right heart strain. Pt on no AC PTA. Has been on lovenox  30mg  daily here - last dose 5/22~1245.  Goal of Therapy:  Heparin  level 0.3-0.7 units/ml Monitor platelets by anticoagulation protocol: Yes   Plan:  D/c Lovenox  Heparin  IV bolus 3000 units (3/4 bolus with recent lovenox  dose) Heparin  gtt at 1050 units/hr Will f/u heparin  level in 8 hours Daily heparin  level and CBC  Enrigue Harvard, PharmD, BCPS Please see amion for complete clinical pharmacist phone list 12/30/2023,4:11 PM

## 2023-12-30 NOTE — Progress Notes (Signed)
 PROGRESS NOTE    Lisa [name]  YNW:[id number] DOB: [birth year] DOA: 12/28/2023 PCP: Faustina Hood, MD  82/F with chronic systolic CHF, hypertension, hypothyroidism, chronic anemia, history of VT/VF arrest, CML, obesity, pleural effusion, lymphedema presented to the ED 5/21 following an ICD shock on 5/16. - Recently hospitalized in 4/25 with UTI had VT with ICD shock X3 in the ED, noted to be volume overloaded with recurrent pleural effusions at the time, underwent thoracentesis which was then complicated by hydropneumothorax required chest tube, this was eventually removed and discharged back home on 5/8. - She received a device alert for ICD shock that occurred on 5/18, patient was sleeping and unaware, reported having dizziness lightheadedness numbness and some right sided chest pain yesterday and presented to the ED. - Labs notable for potassium 3.9, mag 1.5, creatinine 2.1, hemoglobin 8.9, troponin 14, chest x-ray without acute findings, D-dimer was elevated at 8.1   Subjective: Complains of right-sided chest pain, mild fatigue, denies dyspnea  Assessment and Plan:  Recurrent VT/VF, sp ICD shock - EP following, amiodarone  dose increased to 200 Mg twice daily - K was 3.9, mag 1.5 repleted - Monitor on telemetry  Right-sided chest pain, elevated D-dimer - Chest x-ray unremarkable, VQ scan is indeterminant - Will obtain CTA chest, discussed risks with patient considering CKD 3, will administer NS 50 mL/h X 10 hours - Repeat labs tonight and in a.m.  Chronic systolic CHF - Clinically appears euvolemic - Diuretics on hold at this time  CAD - Stable, no ACS, continue aspirin  and statin  Chronic anemia Stable, monitor  AKi/CKD3a - Improving, fluids today for CTA  DVT prophylaxis: lovenox  Code Status: Full Family Communication: None present Disposition Plan: Home in 1-2days if stable  Consultants: EP   Procedures:   Antimicrobials:    Objective: Vitals:    12/29/23 1858 12/30/23 0159 12/30/23 0642 12/30/23 0800  BP: (!) 164/64 (!) 131/58 (!) 140/59 (!) 144/52  Pulse: 60 64 62 66  Resp: (!) 21 20 20 19   Temp: 98.1 F (36.7 C) 98.2 F (36.8 C) 98.3 F (36.8 C) 97.8 F (36.6 C)  TempSrc: Oral Oral Oral Axillary  SpO2: 99% 94% 95% 94%  Weight: 66.8 kg  65.7 kg   Height: 5\' 1"  (1.549 m)       Intake/Output Summary (Last 24 hours) at 12/30/2023 0956 Last data filed at 12/30/2023 6578 Gross per 24 hour  Intake --  Output 600 ml  Net -600 ml   Filed Weights   12/28/23 2334 12/29/23 1858 12/30/23 0642  Weight: 66.3 kg 66.8 kg 65.7 kg    Examination:  Gen: Awake, Alert, Oriented X 3,  HEENT: no JVD Lungs: Good air movement bilaterally, CTAB CVS: S1S2/RRR Abd: soft, Non tender, non distended, BS present Extremities: No edema Skin: no new rashes on exposed skin     Data Reviewed:   CBC: Recent Labs  Lab 12/28/23 2336 12/29/23 2043  WBC 8.8 7.9  HGB 8.9* 9.2*  HCT 27.5* 29.4*  MCV 97.9 100.3*  PLT 252 252   Basic Metabolic Panel: Recent Labs  Lab 12/28/23 2336 12/29/23 2043 12/30/23 0228  NA 136  --  137  K 3.9  --  3.9  CL 99  --  101  CO2 26  --  30  GLUCOSE 118*  --  105*  BUN 31*  --  27*  CREATININE 2.13* 1.68* 1.59*  CALCIUM  9.1  --  8.4*  MG 1.5*  --  2.5*  GFR: Estimated Creatinine Clearance: 24.1 mL/min (A) (by C-G formula based on SCr of 1.59 mg/dL (H)). Liver Function Tests: No results for input(s): "AST", "ALT", "ALKPHOS", "BILITOT", "PROT", "ALBUMIN " in the last 168 hours. No results for input(s): "LIPASE", "AMYLASE" in the last 168 hours. No results for input(s): "AMMONIA" in the last 168 hours. Coagulation Profile: No results for input(s): "INR", "PROTIME" in the last 168 hours. Cardiac Enzymes: No results for input(s): "CKTOTAL", "CKMB", "CKMBINDEX", "TROPONINI" in the last 168 hours. BNP (last 3 results) Recent Labs    12/04/23 1144  PROBNP 4,071.0*   HbA1C: No results for  input(s): "HGBA1C" in the last 72 hours. CBG: No results for input(s): "GLUCAP" in the last 168 hours. Lipid Profile: No results for input(s): "CHOL", "HDL", "LDLCALC", "TRIG", "CHOLHDL", "LDLDIRECT" in the last 72 hours. Thyroid  Function Tests: No results for input(s): "TSH", "T4TOTAL", "FREET4", "T3FREE", "THYROIDAB" in the last 72 hours. Anemia Panel: No results for input(s): "VITAMINB12", "FOLATE", "FERRITIN", "TIBC", "IRON", "RETICCTPCT" in the last 72 hours. Urine analysis:    Component Value Date/Time   COLORURINE YELLOW 12/04/2023 1202   APPEARANCEUR CLEAR 12/04/2023 1202   LABSPEC 1.010 12/04/2023 1202   PHURINE 8.0 12/04/2023 1202   GLUCOSEU NEGATIVE 12/04/2023 1202   HGBUR NEGATIVE 12/04/2023 1202   BILIRUBINUR NEGATIVE 12/04/2023 1202   BILIRUBINUR negative 06/04/2016 0847   BILIRUBINUR neg 02/14/2015 1431   KETONESUR 15 (A) 12/04/2023 1202   PROTEINUR TRACE (A) 12/04/2023 1202   UROBILINOGEN 0.2 06/04/2016 0847   NITRITE NEGATIVE 12/04/2023 1202   LEUKOCYTESUR NEGATIVE 12/04/2023 1202   Sepsis Labs: @LABRCNTIP (procalcitonin:4,lacticidven:4)  )No results found for this or any previous visit (from the past 240 hours).   Radiology Studies: NM Pulmonary Perfusion Result Date: 12/29/2023 CLINICAL DATA:  Positive D-dimer. EXAM: NUCLEAR MEDICINE PERFUSION LUNG SCAN TECHNIQUE: Perfusion images were obtained in multiple projections after intravenous injection of radiopharmaceutical. Ventilation scans intentionally deferred if perfusion scan and chest x-ray adequate for interpretation during COVID 19 epidemic. RADIOPHARMACEUTICALS:  4.4  mCi Tc-20m MAA IV COMPARISON:  Chest x-ray 12/29/2023. FINDINGS: Decreased perfusion in the left lower lobe corresponds to left pleural effusion seen on x-ray. There is some small to moderate-sized areas of decreased perfusion in the posterior right upper lobe seen on lateral and RPO views, but not confirmed on other views. IMPRESSION: 1.  Intermediate probability for pulmonary embolism. Electronically Signed   By: Tyron Gallon M.D.   On: 12/29/2023 16:32   DG Chest 2 View Result Date: 12/29/2023 CLINICAL DATA:  Chest pain EXAM: CHEST - 2 VIEW COMPARISON:  12/16/2023 FINDINGS: Cardiac shadow is stable. Defibrillator is again noted. Previously seen left apical pneumothorax is no longer identified. No focal infiltrate or sizable effusion is seen. No bony abnormality is noted. IMPRESSION: No acute abnormality noted. Electronically Signed   By: Violeta Grey M.D.   On: 12/29/2023 00:09     Scheduled Meds:  acetaminophen   1,000 mg Oral Q8H   amiodarone   200 mg Oral QHS   aspirin  EC  81 mg Oral q AM   atorvastatin   20 mg Oral Daily   enoxaparin  (LOVENOX ) injection  30 mg Subcutaneous Q24H   lidocaine   1 patch Transdermal Q24H   magnesium  oxide  800 mg Oral Daily   thyroid   15 mg Oral QAC breakfast   Continuous Infusions:  sodium chloride        LOS: 0 days    Time spent:    Deforest Fast, MD [facility]   12/30/2023, 9:56 AM

## 2023-12-31 DIAGNOSIS — Z4502 Encounter for adjustment and management of automatic implantable cardiac defibrillator: Secondary | ICD-10-CM | POA: Diagnosis not present

## 2023-12-31 LAB — CBC
HCT: 24.4 % — ABNORMAL LOW (ref 36.0–46.0)
HCT: 26.3 % — ABNORMAL LOW (ref 36.0–46.0)
Hemoglobin: 8 g/dL — ABNORMAL LOW (ref 12.0–15.0)
Hemoglobin: 8.6 g/dL — ABNORMAL LOW (ref 12.0–15.0)
MCH: 31.9 pg (ref 26.0–34.0)
MCH: 32.1 pg (ref 26.0–34.0)
MCHC: 32.8 g/dL (ref 30.0–36.0)
MCHC: 32.7 g/dL (ref 30.0–36.0)
MCV: 97.2 fL (ref 80.0–100.0)
MCV: 98.1 fL (ref 80.0–100.0)
Platelets: 250 10*3/uL (ref 150–400)
Platelets: 282 10*3/uL (ref 150–400)
RBC: 2.51 MIL/uL — ABNORMAL LOW (ref 3.87–5.11)
RBC: 2.68 MIL/uL — ABNORMAL LOW (ref 3.87–5.11)
RDW: 15.5 % (ref 11.5–15.5)
RDW: 15.9 % — ABNORMAL HIGH (ref 11.5–15.5)
WBC: 7.2 10*3/uL (ref 4.0–10.5)
WBC: 6.6 10*3/uL (ref 4.0–10.5)
nRBC: 0 % (ref 0.0–0.2)
nRBC: 0 % (ref 0.0–0.2)

## 2023-12-31 LAB — BASIC METABOLIC PANEL WITH GFR
Anion gap: 5 (ref 5–15)
BUN: 21 mg/dL (ref 8–23)
CO2: 28 mmol/L (ref 22–32)
Calcium: 8.4 mg/dL — ABNORMAL LOW (ref 8.9–10.3)
Chloride: 104 mmol/L (ref 98–111)
Creatinine, Ser: 1.44 mg/dL — ABNORMAL HIGH (ref 0.44–1.00)
GFR, Estimated: 37 mL/min — ABNORMAL LOW (ref >60)
Glucose, Bld: 100 mg/dL — ABNORMAL HIGH (ref 70–99)
Potassium: 4 mmol/L (ref 3.5–5.1)
Sodium: 137 mmol/L (ref 135–145)

## 2023-12-31 LAB — HEPARIN LEVEL (UNFRACTIONATED)
Heparin Unfractionated: 0.74 [IU]/mL — ABNORMAL HIGH (ref 0.30–0.70)
Heparin Unfractionated: 0.57 [IU]/mL (ref 0.30–0.70)

## 2023-12-31 LAB — MAGNESIUM: Magnesium: 2 mg/dL (ref 1.7–2.4)

## 2023-12-31 LAB — GLUCOSE, CAPILLARY: Glucose-Capillary: 113 mg/dL — ABNORMAL HIGH (ref 70–99)

## 2023-12-31 MED ORDER — FUROSEMIDE 10 MG/ML IJ SOLN
40.0000 mg | Freq: Two times a day (BID) | INTRAMUSCULAR | Status: AC
Start: 2023-12-31 — End: 2023-12-31
  Administered 2023-12-31 (×2): 40 mg via INTRAVENOUS
  Filled 2023-12-31 (×2): qty 4

## 2023-12-31 MED ORDER — ACETAMINOPHEN 325 MG PO TABS
650.0000 mg | ORAL_TABLET | Freq: Four times a day (QID) | ORAL | Status: DC | PRN
  Administered 2024-01-07: 325 mg via ORAL
  Filled 2023-12-31: qty 2

## 2023-12-31 MED ORDER — LACTULOSE 10 GM/15ML PO SOLN
20.0000 g | Freq: Two times a day (BID) | ORAL | Status: AC
  Administered 2023-12-31 (×2): 20 g via ORAL
  Filled 2023-12-31 (×2): qty 30

## 2023-12-31 MED ORDER — BISACODYL 10 MG RE SUPP
10.0000 mg | Freq: Once | RECTAL | Status: AC
  Administered 2023-12-31: 10 mg via RECTAL
  Filled 2023-12-31: qty 1

## 2023-12-31 MED ORDER — TRIMETHOBENZAMIDE HCL 100 MG/ML IM SOLN
200.0000 mg | Freq: Four times a day (QID) | INTRAMUSCULAR | Status: DC | PRN
  Administered 2024-01-01: 200 mg via INTRAMUSCULAR
  Filled 2023-12-31 (×3): qty 2

## 2023-12-31 NOTE — Plan of Care (Signed)
   Problem: Education: Goal: Knowledge of General Education information will improve Description: Including pain rating scale, medication(s)/side effects and non-pharmacologic comfort measures Outcome: Progressing   Problem: Clinical Measurements: Goal: Will remain free from infection Outcome: Progressing Goal: Diagnostic test results will improve Outcome: Progressing

## 2023-12-31 NOTE — Evaluation (Signed)
 Physical Therapy Evaluation Patient Details Name: Lisa [name] MRN: [id number] DOB: 10/08/1940 Today's Date: 12/31/2023  History of Present Illness  Patient is an 83 y/o female admitted 12/28/23 due to alert that had ICD shock on 5/18 and was encouraged to come to ED if any symptoms which she had R chest pain, light headedness and found to have acute R side PE and acute on chronic CHF.  PMH positive for CHF, HTN, hypothyroidism, chronic anemia, h/o VT/VF arrest, CML, obesity, pleural effusion, lymphedema.  Recent stay for pleural effusion s/p thoracentesis complicated by  hydropneumothorax and chest tube and found to have UTI.  Clinical Impression  Patient presents with decreased mobility due to generalized weakness, decreased cardiorespiratory endurance and decreased balance.  Previously at home with aide help 4x/week for 4 hours and reports was going to out patient PT though still needing help on stairs for home entry, completed all ADL's on her own.  She needed S to CGA for ambulation with rollator (A turning around to sit) due to SOB though SpO2 93% throughout on RA.  She was instructed on incentive spirometer use and initiated education on energy conservation.  She will benefit from continued skilled PT in the acute setting and from follow up HHPT at d/c.       If plan is discharge home, recommend the following: Assist for transportation;Assistance with cooking/housework;Help with stairs or ramp for entrance   Can travel by private vehicle        Equipment Recommendations None recommended by PT  Recommendations for Other Services       Functional Status Assessment Patient has had a recent decline in their functional status and demonstrates the ability to make significant improvements in function in a reasonable and predictable amount of time.     Precautions / Restrictions Precautions Precautions: Fall Recall of Precautions/Restrictions: Intact      Mobility  Bed  Mobility Overal bed mobility: Needs Assistance       Supine to sit: Supervision Sit to supine: Supervision   General bed mobility comments: assist for lines    Transfers Overall transfer level: Needs assistance Equipment used: Rollator (4 wheels) Transfers: Sit to/from Stand Sit to Stand: Supervision           General transfer comment: discussed sit to stand for LE strength and pulmonary endurance    Ambulation/Gait Ambulation/Gait assistance: Supervision Gait Distance (Feet): 170 Feet (2 seated rest breaks on rollator) Assistive device: Rollator (4 wheels) Gait Pattern/deviations: Step-through pattern, Decreased stride length       General Gait Details: safe and sitting with A for IV line management when resting, SpO2 93% on RA though pt reporting SOB  Stairs            Wheelchair Mobility     Tilt Bed    Modified Rankin (Stroke Patients Only)       Balance Overall balance assessment: Needs assistance   Sitting balance-Leahy Scale: Good     Standing balance support: No upper extremity supported Standing balance-Leahy Scale: Fair Standing balance comment: stood x 2 without walker                             Pertinent Vitals/Pain Pain Assessment Pain Assessment: No/denies pain    Home Living Family/patient expects to be discharged to:: Private residence Living Arrangements: Alone Available Help at Discharge: Family;Available PRN/intermittently;Personal care attendant Type of Home: House Home Access: Stairs to enter Entrance Stairs-Rails:  Right Entrance Stairs-Number of Steps: 3   Home Layout: Two level;Able to live on main level with bedroom/bathroom Home Equipment: Rollator (4 wheels);Shower seat;Cane - single point;BSC/3in1;Grab bars - tub/shower Additional Comments: aides from [employer] 4 d/week x 4 hours a day, had been going to outpatient PT    Prior Function Prior Level of Function : Needs assist                      Extremity/Trunk Assessment   Upper Extremity Assessment Upper Extremity Assessment: Defer to OT evaluation    Lower Extremity Assessment Lower Extremity Assessment: LLE deficits/detail RLE Deficits / Details: B LE lymphedema, R hip flexion 3-/5, knee extension 4/5, ankle WFL RLE Sensation: WNL LLE Deficits / Details: AROM WFL, strength hip flexion 4/5, knee extension 4+/5 LLE Sensation: WNL    Cervical / Trunk Assessment Cervical / Trunk Assessment: Normal  Communication   Communication Communication: No apparent difficulties    Cognition Arousal: Alert     PT - Cognitive impairments: No apparent impairments                         Following commands: Intact       Cueing       General Comments General comments (skin integrity, edema, etc.): BP stable sitting EOB; initiated energy conservation eduation, daughter in the room throughout trying to convince her mom to get HHPT instead of outpatient, pt relates long difficulty getting HHPT after a previous admission due to hospitalist not wanting to sign order and she had not seen her PCP to get her to sign the order, RNCM made aware and came to speak to pt and her daughter. Also educated on AMRAMP for temporary metal ramp to try out since she struggles with her steps and still needing help to get up them for home entry.    Exercises     Assessment/Plan    PT Assessment Patient needs continued PT services  PT Problem List Decreased strength;Decreased activity tolerance;Decreased balance;Decreased mobility;Cardiopulmonary status limiting activity;Decreased knowledge of use of DME       PT Treatment Interventions DME instruction;Gait training;Stair training;Functional mobility training;Therapeutic activities;Therapeutic exercise;Balance training;Neuromuscular re-education;Patient/family education    PT Goals (Current goals can be found in the Care Plan section)  Acute Rehab PT Goals Patient Stated Goal: return  home, setup for HHPT PT Goal Formulation: With patient/family Time For Goal Achievement: 01/14/24 Potential to Achieve Goals: Good    Frequency Min 2X/week     Co-evaluation               AM-PAC PT "6 Clicks" Mobility  Outcome Measure Help needed turning from your back to your side while in a flat bed without using bedrails?: None Help needed moving from lying on your back to sitting on the side of a flat bed without using bedrails?: None Help needed moving to and from a bed to a chair (including a wheelchair)?: A Little Help needed standing up from a chair using your arms (e.g., wheelchair or bedside chair)?: A Little Help needed to walk in hospital room?: A Little Help needed climbing 3-5 steps with a railing? : Total 6 Click Score: 18    End of Session Equipment Utilized During Treatment: Gait belt Activity Tolerance: Patient limited by fatigue Patient left: in bed;with call bell/phone within reach;with family/visitor present;with bed alarm set   PT Visit Diagnosis: Difficulty in walking, not elsewhere classified (R26.2);Muscle weakness (generalized) (M62.81)  Time: 1610-9604 PT Time Calculation (min) (ACUTE ONLY): 57 min   Charges:   PT Evaluation $PT Eval Moderate Complexity: 1 Mod PT Treatments $Gait Training: 8-22 mins $Self Care/Home Management: 8-22 PT General Charges $$ ACUTE PT VISIT: 1 Visit         Abigail Hoff, PT Acute Rehabilitation Services Office:[phone] 12/31/2023   Lisa [name] 12/31/2023, 5:22 PM

## 2023-12-31 NOTE — TOC Progression Note (Signed)
 Transition of Care [facility]) - Progression Note    Patient Details  Name: Lisa [name] MRN: [id number] Date of Birth: April 09, 1941  Transition of Care [facility]) CM/SW Contact  Jennett Model, RN Phone Number: 12/31/2023, 4:32 PM  Clinical Narrative:    NCM was notified by Staff RN , that patient and her daughter would like HHRN, HHPT set up .  NCM spoke with patient over the phone and she put daughter on the phone and she states they would like to have [facility] services with [facility].  NCM made referral to [facility] with [facility] for [facility], HHPT,  he is able to take referral .  Soc will begin 24 to 48 hrs post dc.        Expected Discharge Plan and Services                                               Social Determinants of Health (SDOH) Interventions SDOH Screenings   Food Insecurity: No Food Insecurity (12/29/2023)  Housing: Low Risk  (12/29/2023)  Transportation Needs: No Transportation Needs (12/29/2023)  Utilities: Not At Risk (12/29/2023)  Financial Resource Strain: Low Risk  (10/28/2022)   Received from [facility]  Social Connections: Unknown (12/29/2023)  Recent Concern: Social Connections - Moderately Isolated (12/07/2023)  Tobacco Use: Low Risk  (12/30/2023)    Readmission Risk Interventions    08/10/2023    2:10 PM 07/19/2023    3:29 PM  Readmission Risk Prevention Plan  Transportation Screening Complete Complete  PCP or Specialist Appt within 5-7 Days  Complete  PCP or Specialist Appt within 3-5 Days Complete   Home Care Screening  Complete  Medication Review (RN CM)  Complete  HRI or Home Care Consult Complete   Palliative Care Screening Not Applicable   Medication Review (RN Care Manager) Complete

## 2023-12-31 NOTE — Progress Notes (Signed)
 PHARMACY - ANTICOAGULATION CONSULT NOTE  Pharmacy Consult for heparin  Indication: pulmonary embolus  Patient Measurements: Height: 5\' 1"  (154.9 cm) Weight: 67.3 kg (148 lb 5.9 oz) IBW/kg (Calculated) : 47.8 HEPARIN  DW (KG): 61.9  Vital Signs: Temp: 98.3 F (36.8 C) (05/23 1123) Temp Source: Oral (05/23 1123) BP: 134/65 (05/23 1123) Pulse Rate: 59 (05/23 1123)  Labs: Recent Labs    12/28/23 2336 12/29/23 0127 12/29/23 2043 12/30/23 0228 12/31/23 0230 12/31/23 1245  HGB 8.9*  --  9.2*  --  8.0* 8.6*  HCT 27.5*  --  29.4*  --  24.4* 26.3*  PLT 252  --  252  --  250 282  HEPARINUNFRC  --   --   --   --  0.74* 0.57  CREATININE 2.13*  --  1.68* 1.59* 1.44*  --   TROPONINIHS 15 14  --   --   --   --     Estimated Creatinine Clearance: 26.9 mL/min (A) (by C-G formula based on SCr of 1.44 mg/dL (H)).  Assessment: 83 y.o. F presents with recurrent VT/VF, s/p ICD shock. Found to have right lower lobe segmental and subsegmental pulmonary artery emboli. No evidence of right heart strain. Pt on no AC PTA. Has been on Lovenox  30mg  daily here - last dose 5/22~1245.  Heparin  level therapeutic on 950 units/hr. No bleeding noted, Hgb low stable 8-9s, platelets are normal.    Goal of Therapy:  Heparin  level 0.3-0.7 units/ml Monitor platelets by anticoagulation protocol: Yes   Plan:  Continue heparin  drip at 950 units/hr Daily heparin  level, CBC  Monitor for s/sx of bleeding  Thank you for involving pharmacy in this patient's care.  Caroline Cinnamon, PharmD, BCPS Clinical Pharmacist Clinical phone for 12/31/2023 is [phone] 12/31/2023 2:38 PM

## 2023-12-31 NOTE — Progress Notes (Signed)
 PROGRESS NOTE    Lisa [name]  ZOX:[id number] DOB: 1941/08/06 DOA: 12/28/2023 PCP: Faustina Hood, MD  82/F with chronic systolic CHF, hypertension, hypothyroidism, chronic anemia, history of VT/VF arrest, CML, obesity, pleural effusion, lymphedema presented to the ED 5/21 following an ICD shock on 5/16. - Recently hospitalized in 4/25 with UTI had VT with ICD shock X3 in the ED, noted to be volume overloaded with recurrent pleural effusions at the time, underwent thoracentesis which was then complicated by hydropneumothorax required chest tube, this was eventually removed and discharged back home on 5/8. - She received a device alert for ICD shock that occurred on 5/18, patient was sleeping and unaware, reported having dizziness lightheadedness numbness and some right sided chest pain yesterday and presented to the ED. - Labs notable for potassium 3.9, mag 1.5, creatinine 2.1, hemoglobin 8.9, troponin 14, chest x-ray without acute findings, D-dimer was elevated at 8.1   Subjective: Feels better, no further right-sided chest pain  Assessment and Plan:  Recurrent VT/VF, sp ICD shock - EP following, amiodarone  dose increased to 200 Mg twice daily - Electrolytes are stable  Acute right-sided PE - Chest x-ray unremarkable, VQ scan is indeterminant - CTA positive for PE, decrease in size of hydropneumothorax, left pleural effusion moderate - Increase activity, PT OT, ambulate  Acute on chronic systolic CHF - Appears overloaded, start IV Lasix  - Slowly add GDMT  CAD - Stable, no ACS, continue aspirin  and statin  Chronic anemia Hemoglobin lower than baseline, check iron stores, likely hemodilution contributing  AKi/CKD3a - Improving, monitor  DVT prophylaxis: lovenox  Code Status: Full Family Communication: None present Disposition Plan: Home in 1-2days if stable  Consultants: EP   Procedures:   Antimicrobials:    Objective: Vitals:   12/31/23 0347 12/31/23 0500  12/31/23 0757 12/31/23 1123  BP: (!) 146/57  (!) 142/61 134/65  Pulse: 61  67 (!) 59  Resp: 18  20 20   Temp: 98.6 F (37 C)  98.6 F (37 C) 98.3 F (36.8 C)  TempSrc: Oral  Oral Oral  SpO2: 94%  97% 96%  Weight:  67.3 kg    Height:        Intake/Output Summary (Last 24 hours) at 12/31/2023 1157 Last data filed at 12/31/2023 1125 Gross per 24 hour  Intake 764.78 ml  Output 600 ml  Net 164.78 ml   Filed Weights   12/29/23 1858 12/30/23 0642 12/31/23 0500  Weight: 66.8 kg 65.7 kg 67.3 kg    Examination:  Gen: Awake, Alert, Oriented X 3,  HEENT: + JVD Lungs: Decreased sounds at the bases CVS: S1S2/RRR Abd: soft, Non tender, non distended, BS present Extremities: No edema Skin: no new rashes on exposed skin     Data Reviewed:   CBC: Recent Labs  Lab 12/28/23 2336 12/29/23 2043 12/31/23 0230  WBC 8.8 7.9 7.2  HGB 8.9* 9.2* 8.0*  HCT 27.5* 29.4* 24.4*  MCV 97.9 100.3* 97.2  PLT 252 252 250   Basic Metabolic Panel: Recent Labs  Lab 12/28/23 2336 12/29/23 2043 12/30/23 0228 12/31/23 0230  NA 136  --  137 137  K 3.9  --  3.9 4.0  CL 99  --  101 104  CO2 26  --  30 28  GLUCOSE 118*  --  105* 100*  BUN 31*  --  27* 21  CREATININE 2.13* 1.68* 1.59* 1.44*  CALCIUM  9.1  --  8.4* 8.4*  MG 1.5*  --  2.5* 2.0   GFR: Estimated  Creatinine Clearance: 26.9 mL/min (A) (by C-G formula based on SCr of 1.44 mg/dL (H)). Liver Function Tests: No results for input(s): "AST", "ALT", "ALKPHOS", "BILITOT", "PROT", "ALBUMIN " in the last 168 hours. No results for input(s): "LIPASE", "AMYLASE" in the last 168 hours. No results for input(s): "AMMONIA" in the last 168 hours. Coagulation Profile: No results for input(s): "INR", "PROTIME" in the last 168 hours. Cardiac Enzymes: No results for input(s): "CKTOTAL", "CKMB", "CKMBINDEX", "TROPONINI" in the last 168 hours. BNP (last 3 results) Recent Labs    12/04/23 1144  PROBNP 4,071.0*   HbA1C: No results for input(s):  "HGBA1C" in the last 72 hours. CBG: No results for input(s): "GLUCAP" in the last 168 hours. Lipid Profile: No results for input(s): "CHOL", "HDL", "LDLCALC", "TRIG", "CHOLHDL", "LDLDIRECT" in the last 72 hours. Thyroid  Function Tests: No results for input(s): "TSH", "T4TOTAL", "FREET4", "T3FREE", "THYROIDAB" in the last 72 hours. Anemia Panel: No results for input(s): "VITAMINB12", "FOLATE", "FERRITIN", "TIBC", "IRON", "RETICCTPCT" in the last 72 hours. Urine analysis:    Component Value Date/Time   COLORURINE YELLOW 12/04/2023 1202   APPEARANCEUR CLEAR 12/04/2023 1202   LABSPEC 1.010 12/04/2023 1202   PHURINE 8.0 12/04/2023 1202   GLUCOSEU NEGATIVE 12/04/2023 1202   HGBUR NEGATIVE 12/04/2023 1202   BILIRUBINUR NEGATIVE 12/04/2023 1202   BILIRUBINUR negative 06/04/2016 0847   BILIRUBINUR neg 02/14/2015 1431   KETONESUR 15 (A) 12/04/2023 1202   PROTEINUR TRACE (A) 12/04/2023 1202   UROBILINOGEN 0.2 06/04/2016 0847   NITRITE NEGATIVE 12/04/2023 1202   LEUKOCYTESUR NEGATIVE 12/04/2023 1202   Sepsis Labs: @LABRCNTIP (procalcitonin:4,lacticidven:4)  )No results found for this or any previous visit (from the past 240 hours).   Radiology Studies: CT Angio Chest Pulmonary Embolism (PE) W or WO Contrast Result Date: 12/30/2023 CLINICAL DATA:  Positive D-dimer.  Concern for pulmonary embolism. EXAM: CT ANGIOGRAPHY CHEST WITH CONTRAST TECHNIQUE: Multidetector CT imaging of the chest was performed using the standard protocol during bolus administration of intravenous contrast. Multiplanar CT image reconstructions and MIPs were obtained to evaluate the vascular anatomy. RADIATION DOSE REDUCTION: This exam was performed according to the departmental dose-optimization program which includes automated exposure control, adjustment of the mA and/or kV according to patient size and/or use of iterative reconstruction technique. CONTRAST:  60mL OMNIPAQUE  IOHEXOL  350 MG/ML SOLN COMPARISON:  CT dated  12/13/2023. FINDINGS: Cardiovascular: There is no cardiomegaly or pericardial effusion. Left pectoral pacemaker device. There is coronary vascular calcification. Moderate atherosclerotic calcification of the thoracic aorta. There is right lower lobe segmental and subsegmental pulmonary artery emboli. No evidence of right heart straining. Mediastinum/Nodes: No hilar or mediastinal adenopathy. The esophagus is grossly unremarkable. No mediastinal fluid collection. Lungs/Pleura: Small right pleural effusion. There has been interval removal of the left-sided chest tube. Left hydropneumothorax. Significant decrease in the size of the pleural air on the left compared to prior CT with development of moderate pleural fluid. There is associated compressive atelectasis of the adjacent lungs. The central airways are patent. Upper Abdomen: No acute abnormality. Musculoskeletal: Bilateral breast implants. No acute osseous pathology. Osteopenia. Review of the MIP images confirms the above findings. IMPRESSION: 1. Right lower lobe segmental and subsegmental pulmonary artery emboli. No evidence of right heart straining. 2. Small right pleural effusion. 3. Left hydropneumothorax with significant decrease in the size of the pleural air compared to prior CT with development of moderate pleural fluid. 4.  Aortic Atherosclerosis (ICD10-I70.0). These results were called by telephone at the time of interpretation on 12/30/2023 at 3:40 pm to provider  Deforest Fast , who verbally acknowledged these results. Electronically Signed   By: Angus Bark M.D.   On: 12/30/2023 15:59   NM Pulmonary Perfusion Result Date: 12/29/2023 CLINICAL DATA:  Positive D-dimer. EXAM: NUCLEAR MEDICINE PERFUSION LUNG SCAN TECHNIQUE: Perfusion images were obtained in multiple projections after intravenous injection of radiopharmaceutical. Ventilation scans intentionally deferred if perfusion scan and chest x-ray adequate for interpretation during COVID 19  epidemic. RADIOPHARMACEUTICALS:  4.4  mCi Tc-14m MAA IV COMPARISON:  Chest x-ray 12/29/2023. FINDINGS: Decreased perfusion in the left lower lobe corresponds to left pleural effusion seen on x-ray. There is some small to moderate-sized areas of decreased perfusion in the posterior right upper lobe seen on lateral and RPO views, but not confirmed on other views. IMPRESSION: 1. Intermediate probability for pulmonary embolism. Electronically Signed   By: Tyron Gallon M.D.   On: 12/29/2023 16:32     Scheduled Meds:  amiodarone   200 mg Oral BID   aspirin  EC  81 mg Oral q AM   atorvastatin   20 mg Oral Daily   furosemide   40 mg Intravenous BID   lactulose  20 g Oral BID   lidocaine   1 patch Transdermal Q24H   magnesium  oxide  800 mg Oral Daily   thyroid   15 mg Oral QAC breakfast   timolol   1 drop Both Eyes BID   Continuous Infusions:  heparin  950 Units/hr (12/31/23 0626)     LOS: 1 day    Time spent:    Deforest Fast, MD [facility]   12/31/2023, 11:57 AM

## 2023-12-31 NOTE — Progress Notes (Addendum)
 PHARMACY - ANTICOAGULATION CONSULT NOTE  Pharmacy Consult for Heparin  Indication: pulmonary embolus  Patient Measurements: Height: 5\' 1"  (154.9 cm) Weight: 65.7 kg (144 lb 13.5 oz) IBW/kg (Calculated) : 47.8 HEPARIN  DW (KG): 61.9  Vital Signs: Temp: 98.6 F (37 C) (05/23 0347) Temp Source: Oral (05/23 0347) BP: 146/57 (05/23 0347) Pulse Rate: 61 (05/23 0347)  Labs: Recent Labs    12/28/23 2336 12/29/23 0127 12/29/23 2043 12/30/23 0228 12/31/23 0230  HGB 8.9*  --  9.2*  --  8.0*  HCT 27.5*  --  29.4*  --  24.4*  PLT 252  --  252  --  250  HEPARINUNFRC  --   --   --   --  0.74*  CREATININE 2.13*  --  1.68* 1.59*  --   TROPONINIHS 15 14  --   --   --     Estimated Creatinine Clearance: 24.1 mL/min (A) (by C-G formula based on SCr of 1.59 mg/dL (H)).   Medical History: Past Medical History:  Diagnosis Date   Acute on chronic systolic heart failure (HCC)    Acute renal failure superimposed on stage 3b chronic kidney disease (HCC) 05/22/2022   Bacteremia due to Pseudomonas 06/16/2022   CAD in native artery 2017   stent to LAD   Cardiac arrest (HCC) 09/2015   CHF (congestive heart failure) (HCC)    CML (chronic myelocytic leukemia) (HCC)    Complication of anesthesia    Hx: UTI (urinary tract infection)    Hypertension    Melanoma (HCC)    PONV (postoperative nausea and vomiting)    PVC (premature ventricular contraction)    HISTORY OF   Thyroid  disease    V tach (HCC)    Assessment: 83 y.o. F presents with recurrent VT/VF, s/p ICD shock. Found to have right lower lobe segmental and subsegmental pulmonary artery emboli. No evidence of right heart strain. Pt on no AC PTA. Has been on lovenox  30mg  daily here - last dose 5/22~1245.  AM: heparin  level slightly above goal on 1050 units/hr. Per RN, no signs/symptoms of bleeding. AM CBC shows Hgb drop from 9.6 > 8, plts WNL.  Goal of Therapy:  Heparin  level 0.3-0.7 units/ml Monitor platelets by anticoagulation  protocol: Yes   Plan:  Decrease heparin  gtt to 950 units/hr Heparin  level/CBC in 8 hours Daily heparin  level and CBC  Young Hensen, PharmD, BCPS Clinical Pharmacist 12/31/2023 4:12 AM

## 2023-12-31 NOTE — Plan of Care (Signed)

## 2024-01-01 ENCOUNTER — Inpatient Hospital Stay (HOSPITAL_COMMUNITY)

## 2024-01-01 DIAGNOSIS — Z4502 Encounter for adjustment and management of automatic implantable cardiac defibrillator: Secondary | ICD-10-CM | POA: Diagnosis not present

## 2024-01-01 LAB — CBC
HCT: 26.4 % — ABNORMAL LOW (ref 36.0–46.0)
Hemoglobin: 8.5 g/dL — ABNORMAL LOW (ref 12.0–15.0)
MCH: 31.5 pg (ref 26.0–34.0)
MCHC: 32.2 g/dL (ref 30.0–36.0)
MCV: 97.8 fL (ref 80.0–100.0)
Platelets: 239 10*3/uL (ref 150–400)
RBC: 2.7 MIL/uL — ABNORMAL LOW (ref 3.87–5.11)
RDW: 15.5 % (ref 11.5–15.5)
WBC: 7.5 10*3/uL (ref 4.0–10.5)
nRBC: 0 % (ref 0.0–0.2)

## 2024-01-01 LAB — IRON AND TIBC
Iron: 105 ug/dL (ref 28–170)
Saturation Ratios: 49 % — ABNORMAL HIGH (ref 10.4–31.8)
TIBC: 213 ug/dL — ABNORMAL LOW (ref 250–450)
UIBC: 108 ug/dL

## 2024-01-01 LAB — RETICULOCYTES
Immature Retic Fract: 9.3 % (ref 2.3–15.9)
RBC.: 2.66 MIL/uL — ABNORMAL LOW (ref 3.87–5.11)
Retic Count, Absolute: 73.7 10*3/uL (ref 19.0–186.0)
Retic Ct Pct: 2.8 % (ref 0.4–3.1)

## 2024-01-01 LAB — VITAMIN B12: Vitamin B-12: 499 pg/mL (ref 180–914)

## 2024-01-01 LAB — BASIC METABOLIC PANEL WITH GFR
Anion gap: 9 (ref 5–15)
BUN: 19 mg/dL (ref 8–23)
CO2: 25 mmol/L (ref 22–32)
Calcium: 8.4 mg/dL — ABNORMAL LOW (ref 8.9–10.3)
Chloride: 101 mmol/L (ref 98–111)
Creatinine, Ser: 1.23 mg/dL — ABNORMAL HIGH (ref 0.44–1.00)
GFR, Estimated: 44 mL/min — ABNORMAL LOW (ref >60)
Glucose, Bld: 113 mg/dL — ABNORMAL HIGH (ref 70–99)
Potassium: 3.5 mmol/L (ref 3.5–5.1)
Sodium: 135 mmol/L (ref 135–145)

## 2024-01-01 LAB — FERRITIN: Ferritin: 425 ng/mL — ABNORMAL HIGH (ref 11–307)

## 2024-01-01 LAB — HEPARIN LEVEL (UNFRACTIONATED): Heparin Unfractionated: 0.43 [IU]/mL (ref 0.30–0.70)

## 2024-01-01 LAB — FOLATE: Folate: 7.5 ng/mL (ref >5.9)

## 2024-01-01 LAB — MAGNESIUM: Magnesium: 1.8 mg/dL (ref 1.7–2.4)

## 2024-01-01 MED ORDER — MAGNESIUM SULFATE 2 GM/50ML IV SOLN
2.0000 g | Freq: Once | INTRAVENOUS | Status: AC
  Administered 2024-01-01: 2 g via INTRAVENOUS
  Filled 2024-01-01: qty 50

## 2024-01-01 MED ORDER — SODIUM CHLORIDE 0.9 % IV SOLN
12.5000 mg | INTRAVENOUS | Status: DC | PRN
  Administered 2024-01-01: 12.5 mg via INTRAVENOUS
  Filled 2024-01-01: qty 0.5
  Filled 2024-01-01: qty 12.5
  Filled 2024-01-01: qty 0.5

## 2024-01-01 MED ORDER — FUROSEMIDE 10 MG/ML IJ SOLN
40.0000 mg | Freq: Once | INTRAMUSCULAR | Status: AC
  Administered 2024-01-01: 40 mg via INTRAVENOUS
  Filled 2024-01-01: qty 4

## 2024-01-01 MED ORDER — ONDANSETRON HCL 4 MG/2ML IJ SOLN
4.0000 mg | Freq: Once | INTRAMUSCULAR | Status: AC
  Administered 2024-01-01: 4 mg via INTRAVENOUS
  Filled 2024-01-01: qty 2

## 2024-01-01 MED ORDER — BISACODYL 10 MG RE SUPP
10.0000 mg | Freq: Once | RECTAL | Status: AC
  Administered 2024-01-01: 10 mg via RECTAL
  Filled 2024-01-01: qty 1

## 2024-01-01 MED ORDER — LACTULOSE 10 GM/15ML PO SOLN
20.0000 g | Freq: Two times a day (BID) | ORAL | Status: AC
  Administered 2024-01-01: 20 g via ORAL
  Filled 2024-01-01 (×2): qty 30

## 2024-01-01 NOTE — Progress Notes (Signed)
 Mobility Specialist Progress Note:   01/01/24 1002  Mobility  Activity Transferred to/from [facility]  Level of Assistance Standby assist, set-up cues, supervision of patient - no hands on  Assistive Device None;BSC  Distance Ambulated (ft) 5 ft  Activity Response Tolerated well  Mobility Referral Yes  Mobility visit 1 Mobility  Mobility Specialist Start Time (ACUTE ONLY) 1002  Mobility Specialist Stop Time (ACUTE ONLY) 1010  Mobility Specialist Time Calculation (min) (ACUTE ONLY) 8 min   Pt agreeable to mobility session, however limited by N/V. Pt actively gagging and spitting up phlegm with any movement. Pt declined further ambulation at this time, will f/u as schedule permits. Back in bed with all needs met.  Oneda Big Mobility Specialist Please contact via SecureChat or  Rehab office at [phone]

## 2024-01-01 NOTE — Progress Notes (Signed)
 Mobility Specialist Progress Note:   01/01/24 1550  Mobility  Activity Ambulated with assistance in room  Level of Assistance Standby assist, set-up cues, supervision of patient - no hands on  Assistive Device Four wheel walker  Distance Ambulated (ft) 50 ft  Activity Response Tolerated well  Mobility Referral Yes  Mobility visit 1 Mobility  Mobility Specialist Start Time (ACUTE ONLY) 1550  Mobility Specialist Stop Time (ACUTE ONLY) 1605  Mobility Specialist Time Calculation (min) (ACUTE ONLY) 15 min   Pt continues to have N/V however, requested to attempt OOB mobility again. Pt able to ambulate x2 bouts of 56ft with rollator and supervision. C/o SOB, SpO2 97% on RA. After second bout, pt became nauseous and began gagging and coughing up phlegm. OT in for session. Left sitting EOB.  Lisa [name] Mobility Specialist Please contact via SecureChat or  Rehab office at [phone]

## 2024-01-01 NOTE — Progress Notes (Signed)
 Dr. Achilles Holes notified of elevated BP 186/81 recheck 163/71.  Patient is asymptomatic but is having persistent nausea and vomiting that Tigan has not relieved.  New orders placed in [facility] to be carried out.

## 2024-01-01 NOTE — Progress Notes (Signed)
 PROGRESS NOTE    Lisa [name]  ZOX:[id number] DOB: [birth year] DOA: 12/28/2023 PCP: Faustina Hood, MD  82/F with chronic systolic CHF, hypertension, hypothyroidism, chronic anemia, history of VT/VF arrest, CML, obesity, pleural effusion, lymphedema presented to the ED 5/21 following an ICD shock on 5/16. - Recently hospitalized in 4/25 with UTI had VT with ICD shock X3 in the ED, noted to be volume overloaded with recurrent pleural effusions at the time, underwent thoracentesis which was then complicated by hydropneumothorax required chest tube, this was eventually removed and discharged back home on 5/8. - She received a device alert for ICD shock that occurred on 5/18, patient was sleeping and unaware, reported having dizziness lightheadedness numbness and some right sided chest pain yesterday and presented to the ED. - Labs notable for potassium 3.9, mag 1.5, creatinine 2.1, hemoglobin 8.9, troponin 14, chest x-ray without acute findings, D-dimer was elevated at 8.1 - Admitted, seen by EP, amiodarone  dose increased, magnesium  repleted - VQ scan noted intermediate probability> CTA chest-right-sided PE, improvement in hydropneumothorax of left, started on IV heparin  5/22 - 5/24, having abdominal distention, nausea and vomiting   Subjective: Vomited overnight, nauseated this morning, abdomen is distended, denies dyspnea  Assessment and Plan:  Recurrent VT/VF, sp ICD shock - EP following, amiodarone  dose increased to 200 Mg twice daily - Electrolytes are stable  Acute right-sided PE - Chest x-ray unremarkable, VQ scan indeterminate - CTA positive for PE, decrease in size of hydropneumothorax, left pleural effusion moderate -Continue IV heparin  today, transition to oral Eliquis when bowel issues have improved - Increase activity, PT OT, ambulate  Constipation, abdominal distention, nausea and vomiting - Repeat Dulcolax, lactulose today, KUB is reassuring - Downgrade diet  Acute on  chronic systolic CHF - Mildly volume overloaded, repeat IV Lasix  X1 today, reassess tomorrow when bowel issues are better  CAD - Stable, no ACS, continue aspirin  and statin  Chronic anemia Hemoglobin lower than baseline, iron stores are adequate, likely hemodilution contributing as well  AKi/CKD3a - Improving, monitor  DVT prophylaxis: lovenox  Code Status: Full Family Communication: None present Disposition Plan: Home likely 2 to 3 days  Consultants: EP   Procedures:   Antimicrobials:    Objective: Vitals:   12/31/23 1936 01/01/24 0140 01/01/24 0521 01/01/24 0815  BP: (!) 163/64 (!) 139/50 (!) 146/56 120/76  Pulse: 69 66 66 82  Resp: 19 19 19 20   Temp: 98.4 F (36.9 C) 98.4 F (36.9 C) 98.5 F (36.9 C) 97.7 F (36.5 C)  TempSrc: Oral Oral Oral Oral  SpO2: 94% 98% 96% 99%  Weight:   65.9 kg   Height:        Intake/Output Summary (Last 24 hours) at 01/01/2024 1039 Last data filed at 12/31/2023 1312 Gross per 24 hour  Intake 330 ml  Output 300 ml  Net 30 ml   Filed Weights   12/30/23 0642 12/31/23 0500 01/01/24 0521  Weight: 65.7 kg 67.3 kg 65.9 kg    Examination:  Gen: Awake, Alert, Oriented X 3,  HEENT: + JVD Lungs: Decreased sounds at the bases CVS: S1S2/RRR Abd: soft, Non tender, non distended, BS present Extremities: No edema Skin: no new rashes on exposed skin     Data Reviewed:   CBC: Recent Labs  Lab 12/28/23 2336 12/29/23 2043 12/31/23 0230 12/31/23 1245 01/01/24 0322  WBC 8.8 7.9 7.2 6.6 7.5  HGB 8.9* 9.2* 8.0* 8.6* 8.5*  HCT 27.5* 29.4* 24.4* 26.3* 26.4*  MCV 97.9 100.3* 97.2 98.1 97.8  PLT 252 252 250 282 239   Basic Metabolic Panel: Recent Labs  Lab 12/28/23 2336 12/29/23 2043 12/30/23 0228 12/31/23 0230 01/01/24 0322  NA 136  --  137 137 135  K 3.9  --  3.9 4.0 3.5  CL 99  --  101 104 101  CO2 26  --  30 28 25   GLUCOSE 118*  --  105* 100* 113*  BUN 31*  --  27* 21 19  CREATININE 2.13* 1.68* 1.59* 1.44* 1.23*   CALCIUM  9.1  --  8.4* 8.4* 8.4*  MG 1.5*  --  2.5* 2.0 1.8   GFR: Estimated Creatinine Clearance: 31.1 mL/min (A) (by C-G formula based on SCr of 1.23 mg/dL (H)). Liver Function Tests: No results for input(s): "AST", "ALT", "ALKPHOS", "BILITOT", "PROT", "ALBUMIN " in the last 168 hours. No results for input(s): "LIPASE", "AMYLASE" in the last 168 hours. No results for input(s): "AMMONIA" in the last 168 hours. Coagulation Profile: No results for input(s): "INR", "PROTIME" in the last 168 hours. Cardiac Enzymes: No results for input(s): "CKTOTAL", "CKMB", "CKMBINDEX", "TROPONINI" in the last 168 hours. BNP (last 3 results) Recent Labs    12/04/23 1144  PROBNP 4,071.0*   HbA1C: No results for input(s): "HGBA1C" in the last 72 hours. CBG: Recent Labs  Lab 12/31/23 1723  GLUCAP 113*   Lipid Profile: No results for input(s): "CHOL", "HDL", "LDLCALC", "TRIG", "CHOLHDL", "LDLDIRECT" in the last 72 hours. Thyroid  Function Tests: No results for input(s): "TSH", "T4TOTAL", "FREET4", "T3FREE", "THYROIDAB" in the last 72 hours. Anemia Panel: Recent Labs    01/01/24 0322  VITAMINB12 499  FOLATE 7.5  FERRITIN 425*  TIBC 213*  IRON 105  RETICCTPCT 2.8   Urine analysis:    Component Value Date/Time   COLORURINE YELLOW 12/04/2023 1202   APPEARANCEUR CLEAR 12/04/2023 1202   LABSPEC 1.010 12/04/2023 1202   PHURINE 8.0 12/04/2023 1202   GLUCOSEU NEGATIVE 12/04/2023 1202   HGBUR NEGATIVE 12/04/2023 1202   BILIRUBINUR NEGATIVE 12/04/2023 1202   BILIRUBINUR negative 06/04/2016 0847   BILIRUBINUR neg 02/14/2015 1431   KETONESUR 15 (A) 12/04/2023 1202   PROTEINUR TRACE (A) 12/04/2023 1202   UROBILINOGEN 0.2 06/04/2016 0847   NITRITE NEGATIVE 12/04/2023 1202   LEUKOCYTESUR NEGATIVE 12/04/2023 1202   Sepsis Labs: @LABRCNTIP (procalcitonin:4,lacticidven:4)  )No results found for this or any previous visit (from the past 240 hours).   Radiology Studies: DG Abd 1 View Result  Date: 01/01/2024 CLINICAL DATA:  [id number] Constipation [id number] EXAM: ABDOMEN - 1 VIEW COMPARISON:  10/03/2015 FINDINGS: Stomach and small bowel are nondistended. There is moderate gas and fecal material in the colon without dilatation. AICD leads partially visualized. Aortic Atherosclerosis (ICD10-170.0). IMPRESSION: Nonobstructive bowel gas pattern. Electronically Signed   By: Nicoletta Barrier M.D.   On: 01/01/2024 10:14   CT Angio Chest Pulmonary Embolism (PE) W or WO Contrast Result Date: 12/30/2023 CLINICAL DATA:  Positive D-dimer.  Concern for pulmonary embolism. EXAM: CT ANGIOGRAPHY CHEST WITH CONTRAST TECHNIQUE: Multidetector CT imaging of the chest was performed using the standard protocol during bolus administration of intravenous contrast. Multiplanar CT image reconstructions and MIPs were obtained to evaluate the vascular anatomy. RADIATION DOSE REDUCTION: This exam was performed according to the departmental dose-optimization program which includes automated exposure control, adjustment of the mA and/or kV according to patient size and/or use of iterative reconstruction technique. CONTRAST:  60mL OMNIPAQUE  IOHEXOL  350 MG/ML SOLN COMPARISON:  CT dated 12/13/2023. FINDINGS: Cardiovascular: There is no cardiomegaly or pericardial effusion. Left pectoral pacemaker  device. There is coronary vascular calcification. Moderate atherosclerotic calcification of the thoracic aorta. There is right lower lobe segmental and subsegmental pulmonary artery emboli. No evidence of right heart straining. Mediastinum/Nodes: No hilar or mediastinal adenopathy. The esophagus is grossly unremarkable. No mediastinal fluid collection. Lungs/Pleura: Small right pleural effusion. There has been interval removal of the left-sided chest tube. Left hydropneumothorax. Significant decrease in the size of the pleural air on the left compared to prior CT with development of moderate pleural fluid. There is associated compressive atelectasis of  the adjacent lungs. The central airways are patent. Upper Abdomen: No acute abnormality. Musculoskeletal: Bilateral breast implants. No acute osseous pathology. Osteopenia. Review of the MIP images confirms the above findings. IMPRESSION: 1. Right lower lobe segmental and subsegmental pulmonary artery emboli. No evidence of right heart straining. 2. Small right pleural effusion. 3. Left hydropneumothorax with significant decrease in the size of the pleural air compared to prior CT with development of moderate pleural fluid. 4.  Aortic Atherosclerosis (ICD10-I70.0). These results were called by telephone at the time of interpretation on 12/30/2023 at 3:40 pm to provider [facility] , who verbally acknowledged these results. Electronically Signed   By: Angus Bark M.D.   On: 12/30/2023 15:59     Scheduled Meds:  amiodarone   200 mg Oral BID   aspirin  EC  81 mg Oral q AM   atorvastatin   20 mg Oral Daily   bisacodyl   10 mg Rectal Once   lidocaine   1 patch Transdermal Q24H   magnesium  oxide  800 mg Oral Daily   thyroid   15 mg Oral QAC breakfast   timolol   1 drop Both Eyes BID   Continuous Infusions:  heparin  950 Units/hr (12/31/23 1414)     LOS: 2 days    Time spent:    Deforest Fast, MD [facility]   01/01/2024, 10:39 AM

## 2024-01-01 NOTE — Evaluation (Signed)
 Occupational Therapy Evaluation Patient Details Name: Lisa [name] MRN: [id number] DOB: Oct 21, 1940 Today's Date: 01/01/2024   History of Present Illness   Patient is an 83 y/o female admitted 12/28/23 due to alert that had ICD shock on 5/18 and was encouraged to come to ED if any symptoms which she had R chest pain, light headedness and found to have acute R side PE and acute on chronic CHF.  PMH positive for CHF, HTN, hypothyroidism, chronic anemia, h/o VT/VF arrest, CML, obesity, pleural effusion, lymphedema.  Recent stay for pleural effusion s/p thoracentesis complicated by  hydropneumothorax and chest tube and found to have UTI.     Clinical Impressions Pt received in room finishing session with Mobility Specialist. Pt agreeable to participation in session though pt with significant nausea and dry heaving upon arrival with RN aware. At baseline, pt completes ADLs and IADLs Mod I, functional mobility with a SPC or Rollator Mod I, and drives. Following hospitalization at beginning of 2025, pt reports she needed increased assistance with ADLs and IADLs, but was completing ADLs with Mod I to Supervision and light meal prep with Mod I just prior to this admission. Pt now presents with decreased activity tolerance, generalized B UE weakness, decreased balance, and decreased safety and independence with functional tasks. Pt also limited this session by nausea. Pt currently demonstrates ability to complete ADLs with Set up to Contact guard assist and functional transfers with a Rollator with Supervision. Pt VSS on RA during session. Pt participated well in session and is expected to make good progress toward goals once nausea subsides. Pt will benefit from acute skilled OT services to address deficits outlined below and increase safety and independence with functional tasks. No post acute skilled OT needs are anticipated at this time.      If plan is discharge home, recommend the following:   A  little help with walking and/or transfers;A little help with bathing/dressing/bathroom;Assistance with cooking/housework;Assist for transportation;Help with stairs or ramp for entrance     Functional Status Assessment   Patient has had a recent decline in their functional status and demonstrates the ability to make significant improvements in function in a reasonable and predictable amount of time.     Equipment Recommendations   None recommended by OT (Pt already has needed equipment)     Recommendations for Other Services         Precautions/Restrictions   Precautions Precautions: Fall Recall of Precautions/Restrictions: Intact Restrictions Weight Bearing Restrictions Per Provider Order: No     Mobility Bed Mobility Overal bed mobility: Needs Assistance Bed Mobility: Sit to Supine       Sit to supine: Min assist   General bed mobility comments: Requires assist to lift R LE into bed    Transfers Overall transfer level: Needs assistance Equipment used: Rollator (4 wheels) Transfers: Sit to/from Stand, Bed to chair/wheelchair/BSC Sit to Stand: Supervision     Step pivot transfers: Supervision            Balance Overall balance assessment: Needs assistance Sitting-balance support: No upper extremity supported, Feet supported Sitting balance-Leahy Scale: Good     Standing balance support: No upper extremity supported, Bilateral upper extremity supported, Single extremity supported, During functional activity Standing balance-Leahy Scale: Fair Standing balance comment: able to hold static stand without UE support; benefits from UE support in dynamic standing and ambulation  ADL either performed or assessed with clinical judgement   ADL Overall ADL's : Needs assistance/impaired Eating/Feeding: NPO   Grooming: Set up;Sitting   Upper Body Bathing: Set up;Sitting   Lower Body Bathing: Contact guard assist;Sit  to/from stand;Sitting/lateral leans   Upper Body Dressing : Set up;Sitting   Lower Body Dressing: Contact guard assist;Sitting/lateral leans;Sit to/from stand   Toilet Transfer: Supervision/safety;Ambulation;BSC/3in1;Rollator (4 wheels)   Toileting- Clothing Manipulation and Hygiene: Supervision/safety;Sitting/lateral lean;Sit to/from stand       Functional mobility during ADLs:  (Deferred this session due to pt having just worked with Network engineer and due to pt with significant nausea) General ADL Comments: Pt functional level affected by decreased activity tolerance and nausea this session.     Vision Baseline Vision/History: 1 Wears glasses Ability to See in Adequate Light: 0 Adequate (with glasses) Patient Visual Report: No change from baseline       Perception         Praxis         Pertinent Vitals/Pain Pain Assessment Pain Assessment: No/denies pain Pain Intervention(s): Monitored during session, Other (comment) (Pt without pain, but with significant nausea and dry heaving during session. RN gave medication via injection for nausea during session.)     Extremity/Trunk Assessment Upper Extremity Assessment Upper Extremity Assessment: Right hand dominant;Generalized weakness   Lower Extremity Assessment Lower Extremity Assessment: Defer to PT evaluation   Cervical / Trunk Assessment Cervical / Trunk Assessment: Normal   Communication Communication Communication: No apparent difficulties   Cognition Arousal: Alert Behavior During Therapy: WFL for tasks assessed/performed Cognition: No apparent impairments             OT - Cognition Comments: AAOx4 and pleasant throughout session.                 Following commands: Intact       Cueing  General Comments   Cueing Techniques: Verbal cues;Visual cues  VSS on RA. Pt with significant nausea and dry heaving this session. RN giving medication for nausea during session.   Exercises      Shoulder Instructions      Home Living Family/patient expects to be discharged to:: Private residence Living Arrangements: Alone Available Help at Discharge: Family;Available PRN/intermittently;Personal care attendant Type of Home: House Home Access: Stairs to enter [employer] of Steps: 3 Entrance Stairs-Rails: Right Home Layout: Two level;Able to live on main level with bedroom/bathroom Alternate Level Stairs-Number of Steps: flight   Bathroom Shower/Tub: Producer, television/film/video: Standard Bathroom Accessibility: Yes How Accessible: Accessible via walker Home Equipment: Rollator (4 wheels);Shower seat;Cane - single point;BSC/3in1;Grab bars - tub/shower   Additional Comments: aides from [facility] 4 d/week x 4 hours a day; had been going to outpatient PT; pt once reporting she does not go up to 2nd level of home and once reporting she only goes up to 2nd level with the assist of HH aide      Prior Functioning/Environment Prior Level of Function : Needs assist             Mobility Comments: ambulatory with cane vs rollator at home. participatory in OP PT ADLs Comments: At baseline, pt is Modified Independent with ADLs, IADLs and drives. After prior hospitalization in January 2025, pt reports she required aide assist for ADLs, but was completing ADLs with Mod I to Supervision just prior to this admission. Pt also reports she is now completing light meal prep with Mod I and recieves assistance for other home  management tasks. Pt reports she recently planted tomatoes and flowers and enjoys reading novels.    OT Problem List: Decreased strength;Decreased activity tolerance;Impaired balance (sitting and/or standing)   OT Treatment/Interventions: Self-care/ADL training;Therapeutic exercise;Energy conservation;DME and/or AE instruction;Therapeutic activities;Patient/family education;Balance training      OT Goals(Current goals can be found in the care plan section)    Acute Rehab OT Goals Patient Stated Goal: to feel better and stop being nauseated OT Goal Formulation: With patient Time For Goal Achievement: 01/15/24 Potential to Achieve Goals: Good ADL Goals Pt Will Perform Grooming: with modified independence;standing Pt Will Perform Upper Body Bathing: with modified independence;sitting Pt Will Perform Lower Body Bathing: with modified independence;sitting/lateral leans;sit to/from stand Pt Will Perform Lower Body Dressing: with modified independence;sit to/from stand;sitting/lateral leans Pt Will Transfer to Toilet: with modified independence;ambulating;regular height toilet (with least restrictive AD) Pt Will Perform Toileting - Clothing Manipulation and hygiene: with modified independence;sit to/from stand;sitting/lateral leans Pt/caregiver will Perform Home Exercise Program: Increased strength;Both right and left upper extremity;Independently;With written HEP provided   OT Frequency:  Min 2X/week    Co-evaluation              AM-PAC OT "6 Clicks" Daily Activity     Outcome Measure Help from another person eating meals?: None Help from another person taking care of personal grooming?: A Little Help from another person toileting, which includes using toliet, bedpan, or urinal?: A Little Help from another person bathing (including washing, rinsing, drying)?: A Little Help from another person to put on and taking off regular upper body clothing?: A Little Help from another person to put on and taking off regular lower body clothing?: A Little 6 Click Score: 19   End of Session Equipment Utilized During Treatment: Rollator (4 wheels) Nurse Communication: Mobility status;Other (comment) (Per RN, pt ok to see, but with pt limited by nausea this session)  Activity Tolerance: Patient tolerated treatment well;Treatment limited secondary to medical complications (Comment) (Pt limited due to significant nausea) Patient left: in bed;with call  bell/phone within reach;with bed alarm set  OT Visit Diagnosis: Other abnormalities of gait and mobility (R26.89);Muscle weakness (generalized) (M62.81);Other (comment) (decreased activity tolerance)                Time: 1610-9604 OT Time Calculation (min): 18 min Charges:  OT General Charges $OT Visit: 1 Visit OT Evaluation $OT Eval Low Complexity: 1 Low  Kyrsten Deleeuw "Darral Ellis., OTR/L, MA Acute Rehab [phone]   Walt Gunner 01/01/2024, 6:12 PM

## 2024-01-01 NOTE — Progress Notes (Signed)
 Patient was complaining of nausea and vomiting this morning. Wasn't able to have big BM. Notified MD

## 2024-01-01 NOTE — Plan of Care (Signed)
   Problem: Education: Goal: Knowledge of General Education information will improve Description: Including pain rating scale, medication(s)/side effects and non-pharmacologic comfort measures Outcome: Progressing   Problem: Clinical Measurements: Goal: Will remain free from infection Outcome: Progressing Goal: Diagnostic test results will improve Outcome: Progressing

## 2024-01-01 NOTE — Progress Notes (Signed)
 PHARMACY - ANTICOAGULATION CONSULT NOTE  Pharmacy Consult for heparin  Indication: pulmonary embolus  Patient Measurements: Height: 5\' 1"  (154.9 cm) Weight: 65.9 kg (145 lb 4.5 oz) IBW/kg (Calculated) : 47.8 HEPARIN  DW (KG): 61.9  Vital Signs: Temp: 98.5 F (36.9 C) (05/24 0521) Temp Source: Oral (05/24 0521) BP: 146/56 (05/24 0521) Pulse Rate: 66 (05/24 0521)  Labs: Recent Labs    12/30/23 0228 12/31/23 0230 12/31/23 1245 01/01/24 0322  HGB  --  8.0* 8.6* 8.5*  HCT  --  24.4* 26.3* 26.4*  PLT  --  250 282 239  HEPARINUNFRC  --  0.74* 0.57 0.43  CREATININE 1.59* 1.44*  --  1.23*    Estimated Creatinine Clearance: 31.1 mL/min (A) (by C-G formula based on SCr of 1.23 mg/dL (H)).  Assessment: 83 y.o. F presents with recurrent VT/VF, s/p ICD shock. Found to have right lower lobe segmental and subsegmental pulmonary artery emboli. No evidence of right heart strain. Pt on no AC PTA. Has been on Lovenox  30mg  daily here - last dose 5/22~1245.  Heparin  level 0.43 is therapeutic with heparin  running at 950 units/hr. Hgb (8.5) and PLTs (239) are stable. Per RN, no report of pauses, issues with the line, or signs of bleeding.    Goal of Therapy:  Heparin  level 0.3-0.7 units/ml Monitor platelets by anticoagulation protocol: Yes   Plan:  Continue heparin  drip at 950 units/hr Daily heparin  level, CBC  Monitor for s/sx of bleeding  Thank you for involving pharmacy in this patient's care.  Valarie Garner, PharmD PGY1 Pharmacy Resident  Please check AMION for all [facility] phone numbers After 10:00 PM, call Main Pharmacy [phone] 01/01/2024 7:18 AM

## 2024-01-02 DIAGNOSIS — Z4502 Encounter for adjustment and management of automatic implantable cardiac defibrillator: Secondary | ICD-10-CM | POA: Diagnosis not present

## 2024-01-02 LAB — BASIC METABOLIC PANEL WITH GFR
Anion gap: 12 (ref 5–15)
BUN: 18 mg/dL (ref 8–23)
CO2: 25 mmol/L (ref 22–32)
Calcium: 8.8 mg/dL — ABNORMAL LOW (ref 8.9–10.3)
Chloride: 100 mmol/L (ref 98–111)
Creatinine, Ser: 1.37 mg/dL — ABNORMAL HIGH (ref 0.44–1.00)
GFR, Estimated: 39 mL/min — ABNORMAL LOW (ref >60)
Glucose, Bld: 138 mg/dL — ABNORMAL HIGH (ref 70–99)
Potassium: 3.5 mmol/L (ref 3.5–5.1)
Sodium: 137 mmol/L (ref 135–145)

## 2024-01-02 LAB — URINALYSIS, ROUTINE W REFLEX MICROSCOPIC
Bilirubin Urine: NEGATIVE
Glucose, UA: NEGATIVE mg/dL
Hgb urine dipstick: NEGATIVE
Ketones, ur: NEGATIVE mg/dL
Leukocytes,Ua: NEGATIVE
Nitrite: NEGATIVE
Protein, ur: NEGATIVE mg/dL
Specific Gravity, Urine: 1.019 (ref 1.005–1.030)
pH: 5 (ref 5.0–8.0)

## 2024-01-02 LAB — CBC
HCT: 31.1 % — ABNORMAL LOW (ref 36.0–46.0)
Hemoglobin: 10.1 g/dL — ABNORMAL LOW (ref 12.0–15.0)
MCH: 31.4 pg (ref 26.0–34.0)
MCHC: 32.5 g/dL (ref 30.0–36.0)
MCV: 96.6 fL (ref 80.0–100.0)
Platelets: 351 10*3/uL (ref 150–400)
RBC: 3.22 MIL/uL — ABNORMAL LOW (ref 3.87–5.11)
RDW: 15.4 % (ref 11.5–15.5)
WBC: 11.1 10*3/uL — ABNORMAL HIGH (ref 4.0–10.5)
nRBC: 0 % (ref 0.0–0.2)

## 2024-01-02 LAB — HEPARIN LEVEL (UNFRACTIONATED): Heparin Unfractionated: 0.44 [IU]/mL (ref 0.30–0.70)

## 2024-01-02 LAB — MAGNESIUM: Magnesium: 2.7 mg/dL — ABNORMAL HIGH (ref 1.7–2.4)

## 2024-01-02 MED ORDER — LACTULOSE 10 GM/15ML PO SOLN
20.0000 g | Freq: Two times a day (BID) | ORAL | Status: DC
  Administered 2024-01-02 – 2024-01-04 (×4): 20 g via ORAL
  Filled 2024-01-02 (×5): qty 30

## 2024-01-02 MED ORDER — DIPHENHYDRAMINE HCL 50 MG/ML IJ SOLN
12.5000 mg | Freq: Three times a day (TID) | INTRAMUSCULAR | Status: DC | PRN
  Administered 2024-01-02: 12.5 mg via INTRAVENOUS
  Filled 2024-01-02: qty 1

## 2024-01-02 MED ORDER — DIPHENHYDRAMINE HCL 50 MG/ML IJ SOLN
12.5000 mg | Freq: Once | INTRAMUSCULAR | Status: AC
  Administered 2024-01-02: 12.5 mg via INTRAVENOUS
  Filled 2024-01-02: qty 1

## 2024-01-02 MED ORDER — POTASSIUM CHLORIDE 10 MEQ/100ML IV SOLN
10.0000 meq | INTRAVENOUS | Status: AC
  Administered 2024-01-02 (×4): 10 meq via INTRAVENOUS
  Filled 2024-01-02 (×4): qty 100

## 2024-01-02 MED ORDER — METOCLOPRAMIDE HCL 5 MG/ML IJ SOLN
10.0000 mg | Freq: Once | INTRAMUSCULAR | Status: AC
  Administered 2024-01-02: 10 mg via INTRAVENOUS
  Filled 2024-01-02: qty 2

## 2024-01-02 MED ORDER — POTASSIUM CHLORIDE 20 MEQ PO PACK: 40.0000 meq | PACK | Freq: Once | ORAL | Status: DC

## 2024-01-02 MED ORDER — METOCLOPRAMIDE HCL 5 MG/ML IJ SOLN
5.0000 mg | Freq: Once | INTRAMUSCULAR | Status: AC
  Administered 2024-01-02: 5 mg via INTRAVENOUS
  Filled 2024-01-02: qty 2

## 2024-01-02 MED ORDER — POTASSIUM CHLORIDE CRYS ER 20 MEQ PO TBCR: 40.0000 meq | EXTENDED_RELEASE_TABLET | Freq: Every day | ORAL | Status: DC

## 2024-01-02 NOTE — Progress Notes (Signed)
 PHARMACY - ANTICOAGULATION CONSULT NOTE  Pharmacy Consult for heparin  Indication: pulmonary embolus  Patient Measurements: Height: 5\' 1"  (154.9 cm) Weight: 65.2 kg (143 lb 11.8 oz) IBW/kg (Calculated) : 47.8 HEPARIN  DW (KG): 61.9  Vital Signs: Temp: 98 F (36.7 C) (05/25 0722) Temp Source: Oral (05/25 0722) BP: 147/64 (05/25 0722) Pulse Rate: 76 (05/25 0722)  Labs: Recent Labs    12/31/23 0230 12/31/23 1245 01/01/24 0322 01/02/24 0323  HGB 8.0* 8.6* 8.5* 10.1*  HCT 24.4* 26.3* 26.4* 31.1*  PLT 250 282 239 351  HEPARINUNFRC 0.74* 0.57 0.43 0.44  CREATININE 1.44*  --  1.23* 1.37*    Estimated Creatinine Clearance: 27.9 mL/min (A) (by C-G formula based on SCr of 1.37 mg/dL (H)).  Assessment: 83 y.o. F presents with recurrent VT/VF, s/p ICD shock. Found to have right lower lobe segmental and subsegmental pulmonary artery emboli. No evidence of right heart strain. Pt on no AC PTA. Has been on Lovenox  30mg  daily here - last dose 5/22~1245.  Heparin  level 0.44 is therapeutic with heparin  running at 950 units/hr. Hgb (10.1) and PLTs (351) are stable. Per RN, no report of pauses, issues with the line, or signs of bleeding.   Goal of Therapy:  Heparin  level 0.3-0.7 units/ml Monitor platelets by anticoagulation protocol: Yes   Plan:  Continue heparin  drip at 950 units/hr Daily heparin  level, CBC  Monitor for s/sx of bleeding F/u transition to oral anticoagulation  Thank you for involving pharmacy in this patient's care.  Valarie Garner, PharmD PGY1 Pharmacy Resident  Please check AMION for all [facility] Pharmacy phone numbers After 10:00 PM, call Main Pharmacy [phone] 01/02/2024 7:34 AM

## 2024-01-02 NOTE — Progress Notes (Signed)
 Progress Note  Patient Name: Lisa [name] Date of Encounter: 01/02/2024  Primary Cardiologist: Ola Berger, MD   Subjective   No chest pain or sob. Still weak.  Inpatient Medications    Scheduled Meds:  amiodarone   200 mg Oral BID   aspirin  EC  81 mg Oral q AM   atorvastatin   20 mg Oral Daily   lactulose  20 g Oral BID   lidocaine   1 patch Transdermal Q24H   magnesium  oxide  800 mg Oral Daily   thyroid   15 mg Oral QAC breakfast   timolol   1 drop Both Eyes BID   Continuous Infusions:  heparin  950 Units/hr (01/02/24 1038)   promethazine  (PHENERGAN ) injection (IM or IVPB) Stopped (01/01/24 2306)   PRN Meds: acetaminophen , ipratropium-albuterol , promethazine  (PHENERGAN ) injection (IM or IVPB), trimethobenzamide   Vital Signs    Vitals:   01/02/24 0051 01/02/24 0225 01/02/24 0424 01/02/24 0722  BP: (!) 171/78 (!) 185/80 (!) 159/69 (!) 147/64  Pulse: 70  71 76  Resp: 20  20 18   Temp: 97.7 F (36.5 C)  97.8 F (36.6 C) 98 F (36.7 C)  TempSrc: Oral  Oral Oral  SpO2: 97%  95% 96%  Weight:   65.2 kg   Height:        Intake/Output Summary (Last 24 hours) at 01/02/2024 1040 Last data filed at 01/02/2024 0734 Gross per 24 hour  Intake 774.84 ml  Output 300 ml  Net 474.84 ml   Filed Weights   12/31/23 0500 01/01/24 0521 01/02/24 0424  Weight: 67.3 kg 65.9 kg 65.2 kg    Telemetry    Probable afib with ventricular pacing - Personally Reviewed  ECG    none - Personally Reviewed  Physical Exam   GEN: Frail, No acute distress.   Neck: No JVD Cardiac: RRR, no murmurs, rubs, or gallops.  Respiratory: Clear to auscultation bilaterally. GI: Soft, nontender, non-distended  MS: No edema; No deformity. Neuro:  Nonfocal  Psych: Normal affect   Labs    Chemistry Recent Labs  Lab 12/31/23 0230 01/01/24 0322 01/02/24 0323  NA 137 135 137  K 4.0 3.5 3.5  CL 104 101 100  CO2 28 25 25   GLUCOSE 100* 113* 138*  BUN 21 19 18   CREATININE 1.44* 1.23* 1.37*   CALCIUM  8.4* 8.4* 8.8*  GFRNONAA 37* 44* 39*  ANIONGAP 5 9 12      Hematology Recent Labs  Lab 12/31/23 1245 01/01/24 0322 01/02/24 0323  WBC 6.6 7.5 11.1*  RBC 2.68* 2.70*  2.66* 3.22*  HGB 8.6* 8.5* 10.1*  HCT 26.3* 26.4* 31.1*  MCV 98.1 97.8 96.6  MCH 32.1 31.5 31.4  MCHC 32.7 32.2 32.5  RDW 15.9* 15.5 15.4  PLT 282 239 351    Cardiac EnzymesNo results for input(s): "TROPONINI" in the last 168 hours. No results for input(s): "TROPIPOC" in the last 168 hours.   BNP Recent Labs  Lab 12/29/23 0559  BNP 447.5*     DDimer  Recent Labs  Lab 12/29/23 0255  DDIMER 8.17*     Radiology    DG Abd 1 View Result Date: 01/01/2024 CLINICAL DATA:  [id number] Constipation [id number] EXAM: ABDOMEN - 1 VIEW COMPARISON:  10/03/2015 FINDINGS: Stomach and small bowel are nondistended. There is moderate gas and fecal material in the colon without dilatation. AICD leads partially visualized. Aortic Atherosclerosis (ICD10-170.0). IMPRESSION: Nonobstructive bowel gas pattern. Electronically Signed   By: Nicoletta Barrier M.D.   On: 01/01/2024 10:14  Cardiac Studies   none  Patient Profile     83 y.o. female admitted with nausea, vomiting and abdominal distention with recent PE, as well as VT.  Assessment & Plan    ICD - her device gen change out is scheduled for Friday. If she is still in the hospital we will change it out. She has been at elective replacement for a couple of months. However we have at least 6 weeks before we have to change out her device so the gen change scheduled for this Friday can be pushed back. Coags - if she is placed on an OAC, she will need to stop on Tuesday, none Wed, Thurs, procedure Friday, and then restart 5 days later. PE - this was 3 days ago. She will have to have her blood thinners stopped a total of a week around the time of her gen change out. We could put this off a month if her risk of more blood clots would be lessened.  VT - continue amiodarone  at  current dose.     For questions or updates, please contact [facility] Please consult [email or web address] for contact info under Cardiology/STEMI.      Signed, Manya Sells, MD  01/02/2024, 10:40 AM

## 2024-01-02 NOTE — Plan of Care (Signed)
  Problem: Education: Goal: Knowledge of General Education information will improve Description: Including pain rating scale, medication(s)/side effects and non-pharmacologic comfort measures Outcome: Progressing   Problem: Clinical Measurements: Goal: Ability to maintain clinical measurements within normal limits will improve Outcome: Progressing Goal: Diagnostic test results will improve Outcome: Progressing Goal: Respiratory complications will improve Outcome: Progressing Goal: Cardiovascular complication will be avoided Outcome: Progressing   Problem: Activity: Goal: Risk for activity intolerance will decrease Outcome: Progressing   

## 2024-01-02 NOTE — Progress Notes (Signed)
 PROGRESS NOTE    Lisa [name]  ZOX:[id number] DOB: January 29, 1941 DOA: 12/28/2023 PCP: Faustina Hood, MD  82/F with chronic systolic CHF, hypertension, hypothyroidism, chronic anemia, history of VT/VF arrest, CML, obesity, pleural effusion, lymphedema presented to the ED 5/21 following an ICD shock on 5/16. - Recently hospitalized in 4/25 with UTI had VT with ICD shock X3 in the ED, noted to be volume overloaded with recurrent pleural effusions at the time, underwent thoracentesis which was then complicated by hydropneumothorax required chest tube, this was eventually removed and discharged back home on 5/8. - She received a device alert for ICD shock that occurred on 5/18, patient was sleeping and unaware, reported having dizziness lightheadedness numbness and some right sided chest pain yesterday and presented to the ED. - Labs notable for potassium 3.9, mag 1.5, creatinine 2.1, hemoglobin 8.9, troponin 14, chest x-ray without acute findings, D-dimer was elevated at 8.1 - Admitted, seen by EP, amiodarone  dose increased, magnesium  repleted - VQ scan noted intermediate probability> CTA chest-right-sided PE, improvement in hydropneumothorax of left, started on IV heparin  5/22 - 5/24, having abdominal distention, nausea and vomiting   Subjective: - Belly feels better today, still feels somewhat constipated, vomited at 2 AM  Assessment and Plan:  Recurrent VT/VF, sp ICD shock - EP following, amiodarone  dose increased to 200 Mg twice daily - Electrolytes are stable  Acute right-sided PE - Chest x-ray unremarkable, VQ scan indeterminate - CTA positive for PE, decrease in size of hydropneumothorax, left pleural effusion moderate -Continue IV heparin  today, transition to oral Eliquis when bowel issues have improved, due for generator change on Friday - Increase activity, PT OT, ambulate  Constipation, abdominal distention, nausea and vomiting - KUB reassuring, continue lactulose - Trial of  clears  Acute on chronic systolic CHF - Last echo 4/27 with a EF 40-45%, grade 1 DD, normal RV - Diuretics on hold today with poor p.o. intake  CAD - Stable, no ACS, continue aspirin  and statin  Chronic anemia Hemoglobin lower than baseline, iron stores are adequate, likely hemodilution contributing as well  AKi/CKD3a - Improving, monitor  DVT prophylaxis: lovenox  Code Status: Full Family Communication: None present Disposition Plan: Home likely 2 to 3 days  Consultants: EP   Procedures:   Antimicrobials:    Objective: Vitals:   01/02/24 0225 01/02/24 0424 01/02/24 0722 01/02/24 1131  BP: (!) 185/80 (!) 159/69 (!) 147/64 (!) 147/68  Pulse:  71 76 71  Resp:  20 18 18   Temp:  97.8 F (36.6 C) 98 F (36.7 C) 98.4 F (36.9 C)  TempSrc:  Oral Oral Oral  SpO2:  95% 96% 96%  Weight:  65.2 kg    Height:        Intake/Output Summary (Last 24 hours) at 01/02/2024 1150 Last data filed at 01/02/2024 0734 Gross per 24 hour  Intake 774.84 ml  Output 300 ml  Net 474.84 ml   Filed Weights   12/31/23 0500 01/01/24 0521 01/02/24 0424  Weight: 67.3 kg 65.9 kg 65.2 kg    Examination:  Gen: Awake, Alert, Oriented X 3,  HEENT: no JVD Lungs: Decreased sounds at the bases CVS: S1S2/RRR Abd: soft, Non tender, non distended, BS present Extremities: trace edema Skin: no new rashes on exposed skin     Data Reviewed:   CBC: Recent Labs  Lab 12/29/23 2043 12/31/23 0230 12/31/23 1245 01/01/24 0322 01/02/24 0323  WBC 7.9 7.2 6.6 7.5 11.1*  HGB 9.2* 8.0* 8.6* 8.5* 10.1*  HCT 29.4* 24.4*  26.3* 26.4* 31.1*  MCV 100.3* 97.2 98.1 97.8 96.6  PLT 252 250 282 239 351   Basic Metabolic Panel: Recent Labs  Lab 12/28/23 2336 12/29/23 2043 12/30/23 0228 12/31/23 0230 01/01/24 0322 01/02/24 0323  NA 136  --  137 137 135 137  K 3.9  --  3.9 4.0 3.5 3.5  CL 99  --  101 104 101 100  CO2 26  --  30 28 25 25   GLUCOSE 118*  --  105* 100* 113* 138*  BUN 31*  --  27* 21 19  18   CREATININE 2.13* 1.68* 1.59* 1.44* 1.23* 1.37*  CALCIUM  9.1  --  8.4* 8.4* 8.4* 8.8*  MG 1.5*  --  2.5* 2.0 1.8 2.7*   GFR: Estimated Creatinine Clearance: 27.9 mL/min (A) (by C-G formula based on SCr of 1.37 mg/dL (H)). Liver Function Tests: No results for input(s): "AST", "ALT", "ALKPHOS", "BILITOT", "PROT", "ALBUMIN " in the last 168 hours. No results for input(s): "LIPASE", "AMYLASE" in the last 168 hours. No results for input(s): "AMMONIA" in the last 168 hours. Coagulation Profile: No results for input(s): "INR", "PROTIME" in the last 168 hours. Cardiac Enzymes: No results for input(s): "CKTOTAL", "CKMB", "CKMBINDEX", "TROPONINI" in the last 168 hours. BNP (last 3 results) Recent Labs    12/04/23 1144  PROBNP 4,071.0*   HbA1C: No results for input(s): "HGBA1C" in the last 72 hours. CBG: Recent Labs  Lab 12/31/23 1723  GLUCAP 113*   Lipid Profile: No results for input(s): "CHOL", "HDL", "LDLCALC", "TRIG", "CHOLHDL", "LDLDIRECT" in the last 72 hours. Thyroid  Function Tests: No results for input(s): "TSH", "T4TOTAL", "FREET4", "T3FREE", "THYROIDAB" in the last 72 hours. Anemia Panel: Recent Labs    01/01/24 0322  VITAMINB12 499  FOLATE 7.5  FERRITIN 425*  TIBC 213*  IRON 105  RETICCTPCT 2.8   Urine analysis:    Component Value Date/Time   COLORURINE YELLOW 12/04/2023 1202   APPEARANCEUR CLEAR 12/04/2023 1202   LABSPEC 1.010 12/04/2023 1202   PHURINE 8.0 12/04/2023 1202   GLUCOSEU NEGATIVE 12/04/2023 1202   HGBUR NEGATIVE 12/04/2023 1202   BILIRUBINUR NEGATIVE 12/04/2023 1202   BILIRUBINUR negative 06/04/2016 0847   BILIRUBINUR neg 02/14/2015 1431   KETONESUR 15 (A) 12/04/2023 1202   PROTEINUR TRACE (A) 12/04/2023 1202   UROBILINOGEN 0.2 06/04/2016 0847   NITRITE NEGATIVE 12/04/2023 1202   LEUKOCYTESUR NEGATIVE 12/04/2023 1202   Sepsis Labs: @LABRCNTIP (procalcitonin:4,lacticidven:4)  )No results found for this or any previous visit (from the past  240 hours).   Radiology Studies: DG Abd 1 View Result Date: 01/01/2024 CLINICAL DATA:  [id number] Constipation [id number] EXAM: ABDOMEN - 1 VIEW COMPARISON:  10/03/2015 FINDINGS: Stomach and small bowel are nondistended. There is moderate gas and fecal material in the colon without dilatation. AICD leads partially visualized. Aortic Atherosclerosis (ICD10-170.0). IMPRESSION: Nonobstructive bowel gas pattern. Electronically Signed   By: Nicoletta Barrier M.D.   On: 01/01/2024 10:14     Scheduled Meds:  amiodarone   200 mg Oral BID   aspirin  EC  81 mg Oral q AM   atorvastatin   20 mg Oral Daily   lactulose  20 g Oral BID   lidocaine   1 patch Transdermal Q24H   magnesium  oxide  800 mg Oral Daily   thyroid   15 mg Oral QAC breakfast   timolol   1 drop Both Eyes BID   Continuous Infusions:  heparin  950 Units/hr (01/02/24 1038)   promethazine  (PHENERGAN ) injection (IM or IVPB) Stopped (01/01/24 2306)     LOS:  3 days    Time spent:    Deforest Fast, MD [facility]   01/02/2024, 11:50 AM

## 2024-01-02 NOTE — Progress Notes (Signed)
 Report of 20 beat run of VT from monitor tech.  Upon assessment patient sleeping but easily aroused to be oriented x4 and denies symptoms.  Heart rhythm returned to paced rate in 60's.  BP 185/80.  Dr. Achilles Holes notified of the above and new orders placed in [facility] to be carried out.

## 2024-01-03 DIAGNOSIS — Z4502 Encounter for adjustment and management of automatic implantable cardiac defibrillator: Secondary | ICD-10-CM | POA: Diagnosis not present

## 2024-01-03 LAB — CBC
HCT: 26.5 % — ABNORMAL LOW (ref 36.0–46.0)
Hemoglobin: 8.7 g/dL — ABNORMAL LOW (ref 12.0–15.0)
MCH: 31.8 pg (ref 26.0–34.0)
MCHC: 32.8 g/dL (ref 30.0–36.0)
MCV: 96.7 fL (ref 80.0–100.0)
Platelets: 313 10*3/uL (ref 150–400)
RBC: 2.74 MIL/uL — ABNORMAL LOW (ref 3.87–5.11)
RDW: 15.4 % (ref 11.5–15.5)
WBC: 11.9 10*3/uL — ABNORMAL HIGH (ref 4.0–10.5)
nRBC: 0 % (ref 0.0–0.2)

## 2024-01-03 LAB — BASIC METABOLIC PANEL WITH GFR
Anion gap: 8 (ref 5–15)
BUN: 23 mg/dL (ref 8–23)
CO2: 26 mmol/L (ref 22–32)
Calcium: 8.4 mg/dL — ABNORMAL LOW (ref 8.9–10.3)
Chloride: 101 mmol/L (ref 98–111)
Creatinine, Ser: 1.47 mg/dL — ABNORMAL HIGH (ref 0.44–1.00)
GFR, Estimated: 36 mL/min — ABNORMAL LOW (ref >60)
Glucose, Bld: 119 mg/dL — ABNORMAL HIGH (ref 70–99)
Potassium: 4 mmol/L (ref 3.5–5.1)
Sodium: 135 mmol/L (ref 135–145)

## 2024-01-03 LAB — MAGNESIUM: Magnesium: 2.8 mg/dL — ABNORMAL HIGH (ref 1.7–2.4)

## 2024-01-03 LAB — HEPARIN LEVEL (UNFRACTIONATED): Heparin Unfractionated: 0.48 [IU]/mL (ref 0.30–0.70)

## 2024-01-03 NOTE — Progress Notes (Signed)
 Mobility Specialist Progress Note:   01/03/24 0946  Mobility  Activity Ambulated with assistance in hallway  Level of Assistance Contact guard assist, steadying assist  Assistive Device Four wheel walker  Distance Ambulated (ft) 200 ft  Activity Response Tolerated well  Mobility Referral Yes  Mobility visit 1 Mobility  Mobility Specialist Start Time (ACUTE ONLY) 0946  Mobility Specialist Stop Time (ACUTE ONLY) 1000  Mobility Specialist Time Calculation (min) (ACUTE ONLY) 14 min   Pt agreeable to mobility session. Feeling much better today, no N/V. Pt able to ambulate with rollator and minG assist for safety. X1 seated rest break taken, VSS on RA. Pt back in bed with all needs met.   Oneda Big Mobility Specialist Please contact via SecureChat or  Rehab office at [phone]

## 2024-01-03 NOTE — Plan of Care (Signed)

## 2024-01-03 NOTE — Progress Notes (Signed)
 PHARMACY - ANTICOAGULATION CONSULT NOTE  Pharmacy Consult for heparin  Indication: pulmonary embolus  Patient Measurements: Height: 5\' 1"  (154.9 cm) Weight: 65.8 kg (145 lb) IBW/kg (Calculated) : 47.8 HEPARIN  DW (KG): 61.9  Vital Signs: Temp: 97.6 F (36.4 C) (05/26 0346) Temp Source: Oral (05/26 0346) BP: 148/67 (05/26 0346) Pulse Rate: 72 (05/26 0346)  Labs: Recent Labs    01/01/24 0322 01/02/24 0323 01/03/24 0313  HGB 8.5* 10.1* 8.7*  HCT 26.4* 31.1* 26.5*  PLT 239 351 313  HEPARINUNFRC 0.43 0.44 0.48  CREATININE 1.23* 1.37* 1.47*    Estimated Creatinine Clearance: 26.1 mL/min (A) (by C-G formula based on SCr of 1.47 mg/dL (H)).  Assessment: 83 y.o. F presents with recurrent VT/VF, s/p ICD shock. Found to have right lower lobe segmental and subsegmental pulmonary artery emboli. No evidence of right heart strain. Pt on no AC PTA. Has been on Lovenox  30mg  daily here - last dose 5/22~1245.  Heparin  level 0.48 is therapeutic with heparin  running at 950 units/hr. Hgb (8.7) and PLTs (313) are stable. Per RN, no report of pauses, issues with the line, or signs of bleeding.   Goal of Therapy:  Heparin  level 0.3-0.7 units/ml Monitor platelets by anticoagulation protocol: Yes   Plan:  Continue heparin  drip at 950 units/hr Daily heparin  level, CBC  Monitor for s/sx of bleeding F/u transition to oral anticoagulation/plan for generator replacement  Thank you for involving pharmacy in this patient's care.  Valarie Garner, PharmD PGY1 Pharmacy Resident  Please check AMION for all [facility] Pharmacy phone numbers After 10:00 PM, call Main Pharmacy [phone] 01/03/2024 7:26 AM

## 2024-01-03 NOTE — Progress Notes (Signed)
 Physical Therapy Treatment Patient Details Name: Lisa [name] MRN: [id number] DOB: 09/27/1940 Today's Date: 01/03/2024   History of Present Illness Patient is an 83 y/o female admitted 12/28/23 due to alert that had ICD shock on 5/18 and was encouraged to come to ED if any symptoms which she had R chest pain, light headedness and found to have acute R side PE and acute on chronic CHF.  PMH positive for CHF, HTN, hypothyroidism, chronic anemia, h/o VT/VF arrest, CML, obesity, pleural effusion, lymphedema.  Recent stay for pleural effusion s/p thoracentesis complicated by  hydropneumothorax and chest tube and found to have UTI.    PT Comments  Pt admitted with above diagnosis. Pt was able to ambulate with rollator with good safety overall increasing distance. Needed one sitting rest break due to DOE 2/4.  Pt rested and was able to complete walk back to room. O2 sats > 94% throughout walk on RA.  Will continue to follow acutely.  Pt currently with functional limitations due to the deficits listed below (see PT Problem List). Pt will benefit from acute skilled PT to increase their independence and safety with mobility to allow discharge.       If plan is discharge home, recommend the following: Assist for transportation;Assistance with cooking/housework;Help with stairs or ramp for entrance   Can travel by private vehicle        Equipment Recommendations  None recommended by PT    Recommendations for Other Services       Precautions / Restrictions Precautions Precautions: Fall Recall of Precautions/Restrictions: Intact Precaution/Restrictions Comments: diarrhea/bowel urgency Restrictions Weight Bearing Restrictions Per Provider Order: No     Mobility  Bed Mobility Overal bed mobility: Needs Assistance Bed Mobility: Sit to Supine     Supine to sit: Supervision Sit to supine: Min assist   General bed mobility comments: Requires assist to lift R LE into bed     Transfers Overall transfer level: Needs assistance Equipment used: Rollator (4 wheels) Transfers: Sit to/from Stand, Bed to chair/wheelchair/BSC Sit to Stand: Supervision   Step pivot transfers: Supervision       General transfer comment: discussed sit to stand for LE strength and pulmonary endurance    Ambulation/Gait Ambulation/Gait assistance: Supervision Gait Distance (Feet): 190 Feet Assistive device: Rollator (4 wheels) Gait Pattern/deviations: Step-through pattern, Decreased stride length Gait velocity: decreased Gait velocity interpretation: <1.31 ft/sec, indicative of household ambulator   General Gait Details: Pt ambulated with rollator with overall good safety.  took one seated rest break.  pt only needed  A for IV line management, SpO2 94% on RA though pt reporting SOB   Stairs             Wheelchair Mobility     Tilt Bed    Modified Rankin (Stroke Patients Only)       Balance Overall balance assessment: Needs assistance Sitting-balance support: No upper extremity supported, Feet supported Sitting balance-Leahy Scale: Good     Standing balance support: No upper extremity supported, Bilateral upper extremity supported, Single extremity supported, During functional activity Standing balance-Leahy Scale: Fair Standing balance comment: able to hold static stand without UE support; benefits from UE support in dynamic standing and ambulation                            Communication Communication Communication: No apparent difficulties  Cognition Arousal: Alert Behavior During Therapy: WFL for tasks assessed/performed   PT - Cognitive impairments:  No apparent impairments                         Following commands: Intact      Cueing Cueing Techniques: Verbal cues, Visual cues  Exercises General Exercises - Lower Extremity Ankle Circles/Pumps: AROM, Both, 15 reps, Seated Gluteal Sets: Strengthening, Both, 10 reps,  Seated Long Arc Quad: Strengthening, Both, 10 reps, Seated    General Comments        Pertinent Vitals/Pain Pain Assessment Pain Assessment: No/denies pain    Home Living                          Prior Function            PT Goals (current goals can now be found in the care plan section) Progress towards PT goals: Progressing toward goals    Frequency    Min 2X/week      PT Plan      Co-evaluation              AM-PAC PT "6 Clicks" Mobility   Outcome Measure  Help needed turning from your back to your side while in a flat bed without using bedrails?: None Help needed moving from lying on your back to sitting on the side of a flat bed without using bedrails?: None Help needed moving to and from a bed to a chair (including a wheelchair)?: A Little Help needed standing up from a chair using your arms (e.g., wheelchair or bedside chair)?: A Little Help needed to walk in hospital room?: A Little Help needed climbing 3-5 steps with a railing? : Total 6 Click Score: 18    End of Session Equipment Utilized During Treatment: Gait belt Activity Tolerance: Patient limited by fatigue Patient left: in bed;with call bell/phone within reach;with bed alarm set Nurse Communication: Mobility status PT Visit Diagnosis: Difficulty in walking, not elsewhere classified (R26.2);Muscle weakness (generalized) (M62.81) Pain - Right/Left: Right Pain - part of body:  (back)     Time: 4098-1191 PT Time Calculation (min) (ACUTE ONLY): 19 min  Charges:    $Gait Training: 8-22 mins PT General Charges $$ ACUTE PT VISIT: 1 Visit                     Jonavon Trieu M,PT Acute Rehab Services [phone]    Florencia Hunter 01/03/2024, 3:27 PM

## 2024-01-03 NOTE — Progress Notes (Signed)
   Patient Name: KENNETTE CUTHRELL Date of Encounter: 01/03/2024 [facility] Cardiologist: Ola Berger, MD   Interval Summary  .    No acute overnight events. Patient reports feeling relatively well. No new or acute complaints.    Vital Signs .    Vitals:   01/03/24 0057 01/03/24 0346 01/03/24 0416 01/03/24 0749  BP: (!) 147/61 (!) 148/67  137/60  Pulse: 67 72  69  Resp: 18 19  18   Temp: 98.7 F (37.1 C) 97.6 F (36.4 C)  98.4 F (36.9 C)  TempSrc: Oral Oral  Oral  SpO2: 99% 95%  97%  Weight:   65.8 kg   Height:        Intake/Output Summary (Last 24 hours) at 01/03/2024 0750 Last data filed at 01/03/2024 0346 Gross per 24 hour  Intake 280 ml  Output 500 ml  Net -220 ml      01/03/2024    4:16 AM 01/02/2024    4:24 AM 01/01/2024    5:21 AM  Last 3 Weights  Weight (lbs) 145 lb 143 lb 11.8 oz 145 lb 4.5 oz  Weight (kg) 65.772 kg 65.2 kg 65.9 kg      Telemetry/ECG    AS / VP rhythm  - Personally Reviewed  Physical Exam .   GEN: No acute distress.   Neck: No JVD Cardiac: Normal rate, regular rhythm Respiratory: Clear to auscultation bilaterally. GI: Soft, non-distended  MS: No edema  Assessment & Plan .     #ICD at ERI: She has a gen change scheduled for Friday with Dr. Carolynne Citron.  -If she is still in the hospital we will arrange for it to be done as inpatient. If she is discharged then she can return as outpatient as scheduled.  -If she is placed on oral anti-coagulation, she will need to stop on Tuesday, none Wed, Thurs, procedure Friday, and then restart 5 days later.  #PE:  -See above regarding holding oral anti-coagulation around time of gen change. If PE is felt to be too significant to safely interrupt anti-coagulation, then we could delay gen change by approximately a month.  #VT: None on tele in past 24 hours.  -Continue amiodarone  at current dose.  For questions or updates, please contact [facility] Please consult [email or web address]  for contact info under        Signed, Ardeen Kohler, MD

## 2024-01-03 NOTE — Progress Notes (Signed)
 PROGRESS NOTE    Lisa [name]  ZOX:[id number] DOB: February 23, 1941 DOA: 12/28/2023 PCP: Faustina Hood, MD  82/F with chronic systolic CHF, hypertension, hypothyroidism, chronic anemia, history of VT/VF arrest, CML, obesity, pleural effusion, lymphedema presented to the ED 5/21 following an ICD shock on 5/16. - Recently hospitalized in 4/25 with UTI had VT with ICD shock X3 in the ED, noted to be volume overloaded with recurrent pleural effusions at the time, underwent thoracentesis which was then complicated by hydropneumothorax required chest tube, this was eventually removed and discharged back home on 5/8. - She received a device alert for ICD shock that occurred on 5/18, patient was sleeping and unaware, reported having dizziness lightheadedness numbness and some right sided chest pain yesterday and presented to the ED. - Labs notable for potassium 3.9, mag 1.5, creatinine 2.1, hemoglobin 8.9, troponin 14, chest x-ray without acute findings, D-dimer was elevated at 8.1 - Admitted, seen by EP, amiodarone  dose increased, magnesium  repleted - VQ scan noted intermediate probability> CTA chest-right-sided PE, improvement in hydropneumothorax of left, started on IV heparin  5/22 - 5/24, having abdominal distention, nausea and vomiting   Subjective: - Feels better, vomited once overnight, belly is less distended, still feels constipated  Assessment and Plan:  Recurrent VT/VF, sp ICD shock - EP following, amiodarone  dose increased to 200 Mg twice daily - Electrolytes are stable  Acute right-sided PE - Chest x-ray unremarkable, VQ scan indeterminate - CTA positive for PE, decrease in size of hydropneumothorax, left pleural effusion moderate - Remains on IV heparin , plan was for transition to oral Eliquis however she is due for generator change on Friday, if she is not better enough for DC home tomorrow on subcu Lovenox  then may need to stay inpatient for procedure  - Increase activity, PT OT,  ambulate  Constipation, abdominal distention, nausea and vomiting - KUB reassuring, continue lactulose - Improving, advance diet  Acute on chronic systolic CHF - Last echo 4/27 with a EF 40-45%, grade 1 DD, normal RV - Diuretics on hold today with poor p.o. intake  CAD - Stable, no ACS, continue aspirin  and statin  Chronic anemia Hemoglobin lower than baseline, iron stores are adequate, likely hemodilution contributing as well  AKi/CKD3a - Improving, monitor  DVT prophylaxis: lovenox  Code Status: Full Family Communication: None present Disposition Plan: Home likely tomorrow or Friday  Consultants: EP   Procedures:   Antimicrobials:    Objective: Vitals:   01/03/24 0057 01/03/24 0346 01/03/24 0416 01/03/24 0749  BP: (!) 147/61 (!) 148/67  137/60  Pulse: 67 72  69  Resp: 18 19  18   Temp: 98.7 F (37.1 C) 97.6 F (36.4 C)  98.4 F (36.9 C)  TempSrc: Oral Oral  Oral  SpO2: 99% 95%  97%  Weight:   65.8 kg   Height:        Intake/Output Summary (Last 24 hours) at 01/03/2024 1125 Last data filed at 01/03/2024 0858 Gross per 24 hour  Intake 640 ml  Output 500 ml  Net 140 ml   Filed Weights   01/01/24 0521 01/02/24 0424 01/03/24 0416  Weight: 65.9 kg 65.2 kg 65.8 kg    Examination:  Gen: Awake, Alert, Oriented X 3,  HEENT: no JVD Lungs: Decreased sounds at the bases CVS: S1S2/RRR Abd: soft, Non tender, less distended, BS present Extremities: trace edema Skin: no new rashes on exposed skin     Data Reviewed:   CBC: Recent Labs  Lab 12/31/23 0230 12/31/23 1245 01/01/24 0322 01/02/24  1610 01/03/24 0313  WBC 7.2 6.6 7.5 11.1* 11.9*  HGB 8.0* 8.6* 8.5* 10.1* 8.7*  HCT 24.4* 26.3* 26.4* 31.1* 26.5*  MCV 97.2 98.1 97.8 96.6 96.7  PLT 250 282 239 351 313   Basic Metabolic Panel: Recent Labs  Lab 12/30/23 0228 12/31/23 0230 01/01/24 0322 01/02/24 0323 01/03/24 0313  NA 137 137 135 137 135  K 3.9 4.0 3.5 3.5 4.0  CL 101 104 101 100 101   CO2 30 28 25 25 26   GLUCOSE 105* 100* 113* 138* 119*  BUN 27* 21 19 18 23   CREATININE 1.59* 1.44* 1.23* 1.37* 1.47*  CALCIUM  8.4* 8.4* 8.4* 8.8* 8.4*  MG 2.5* 2.0 1.8 2.7* 2.8*   GFR: Estimated Creatinine Clearance: 26.1 mL/min (A) (by C-G formula based on SCr of 1.47 mg/dL (H)). Liver Function Tests: No results for input(s): "AST", "ALT", "ALKPHOS", "BILITOT", "PROT", "ALBUMIN " in the last 168 hours. No results for input(s): "LIPASE", "AMYLASE" in the last 168 hours. No results for input(s): "AMMONIA" in the last 168 hours. Coagulation Profile: No results for input(s): "INR", "PROTIME" in the last 168 hours. Cardiac Enzymes: No results for input(s): "CKTOTAL", "CKMB", "CKMBINDEX", "TROPONINI" in the last 168 hours. BNP (last 3 results) Recent Labs    12/04/23 1144  PROBNP 4,071.0*   HbA1C: No results for input(s): "HGBA1C" in the last 72 hours. CBG: Recent Labs  Lab 12/31/23 1723  GLUCAP 113*   Lipid Profile: No results for input(s): "CHOL", "HDL", "LDLCALC", "TRIG", "CHOLHDL", "LDLDIRECT" in the last 72 hours. Thyroid  Function Tests: No results for input(s): "TSH", "T4TOTAL", "FREET4", "T3FREE", "THYROIDAB" in the last 72 hours. Anemia Panel: Recent Labs    01/01/24 0322  VITAMINB12 499  FOLATE 7.5  FERRITIN 425*  TIBC 213*  IRON 105  RETICCTPCT 2.8   Urine analysis:    Component Value Date/Time   COLORURINE AMBER (A) 01/02/2024 0725   APPEARANCEUR HAZY (A) 01/02/2024 0725   LABSPEC 1.019 01/02/2024 0725   PHURINE 5.0 01/02/2024 0725   GLUCOSEU NEGATIVE 01/02/2024 0725   HGBUR NEGATIVE 01/02/2024 0725   BILIRUBINUR NEGATIVE 01/02/2024 0725   BILIRUBINUR negative 06/04/2016 0847   BILIRUBINUR neg 02/14/2015 1431   KETONESUR NEGATIVE 01/02/2024 0725   PROTEINUR NEGATIVE 01/02/2024 0725   UROBILINOGEN 0.2 06/04/2016 0847   NITRITE NEGATIVE 01/02/2024 0725   LEUKOCYTESUR NEGATIVE 01/02/2024 0725   Sepsis  Labs: @LABRCNTIP (procalcitonin:4,lacticidven:4)  )No results found for this or any previous visit (from the past 240 hours).   Radiology Studies: No results found.    Scheduled Meds:  amiodarone   200 mg Oral BID   aspirin  EC  81 mg Oral q AM   atorvastatin   20 mg Oral Daily   lactulose  20 g Oral BID   lidocaine   1 patch Transdermal Q24H   magnesium  oxide  800 mg Oral Daily   thyroid   15 mg Oral QAC breakfast   timolol   1 drop Both Eyes BID   Continuous Infusions:  heparin  950 Units/hr (01/02/24 1038)   promethazine  (PHENERGAN ) injection (IM or IVPB) Stopped (01/01/24 2306)     LOS: 4 days    Time spent:    Deforest Fast, MD [facility]   01/03/2024, 11:25 AM

## 2024-01-04 ENCOUNTER — Encounter

## 2024-01-04 ENCOUNTER — Other Ambulatory Visit (HOSPITAL_COMMUNITY): Payer: Self-pay

## 2024-01-04 DIAGNOSIS — Z4502 Encounter for adjustment and management of automatic implantable cardiac defibrillator: Secondary | ICD-10-CM | POA: Diagnosis not present

## 2024-01-04 LAB — IRON AND TIBC
Iron: 133 ug/dL (ref 28–170)
Saturation Ratios: 67 % — ABNORMAL HIGH (ref 10.4–31.8)
TIBC: 199 ug/dL — ABNORMAL LOW (ref 250–450)
UIBC: 66 ug/dL

## 2024-01-04 LAB — CBC
HCT: 23.9 % — ABNORMAL LOW (ref 36.0–46.0)
Hemoglobin: 7.7 g/dL — ABNORMAL LOW (ref 12.0–15.0)
MCH: 31 pg (ref 26.0–34.0)
MCHC: 32.2 g/dL (ref 30.0–36.0)
MCV: 96.4 fL (ref 80.0–100.0)
Platelets: 261 10*3/uL (ref 150–400)
RBC: 2.48 MIL/uL — ABNORMAL LOW (ref 3.87–5.11)
RDW: 15.3 % (ref 11.5–15.5)
WBC: 8.3 10*3/uL (ref 4.0–10.5)
nRBC: 0 % (ref 0.0–0.2)

## 2024-01-04 LAB — BASIC METABOLIC PANEL WITH GFR
Anion gap: 5 (ref 5–15)
BUN: 25 mg/dL — ABNORMAL HIGH (ref 8–23)
CO2: 27 mmol/L (ref 22–32)
Calcium: 8 mg/dL — ABNORMAL LOW (ref 8.9–10.3)
Chloride: 102 mmol/L (ref 98–111)
Creatinine, Ser: 1.56 mg/dL — ABNORMAL HIGH (ref 0.44–1.00)
GFR, Estimated: 33 mL/min — ABNORMAL LOW (ref >60)
Glucose, Bld: 90 mg/dL (ref 70–99)
Potassium: 4 mmol/L (ref 3.5–5.1)
Sodium: 134 mmol/L — ABNORMAL LOW (ref 135–145)

## 2024-01-04 LAB — MAGNESIUM: Magnesium: 2.5 mg/dL — ABNORMAL HIGH (ref 1.7–2.4)

## 2024-01-04 LAB — RETICULOCYTES
Immature Retic Fract: 9.4 % (ref 2.3–15.9)
RBC.: 2.72 MIL/uL — ABNORMAL LOW (ref 3.87–5.11)
Retic Count, Absolute: 70.2 10*3/uL (ref 19.0–186.0)
Retic Ct Pct: 2.6 % (ref 0.4–3.1)

## 2024-01-04 LAB — VITAMIN B12: Vitamin B-12: 530 pg/mL (ref 180–914)

## 2024-01-04 LAB — FOLATE: Folate: 6.1 ng/mL (ref >5.9)

## 2024-01-04 LAB — HEPARIN LEVEL (UNFRACTIONATED): Heparin Unfractionated: 0.45 [IU]/mL (ref 0.30–0.70)

## 2024-01-04 LAB — FERRITIN: Ferritin: 444 ng/mL — ABNORMAL HIGH (ref 11–307)

## 2024-01-04 MED ORDER — LACTULOSE 10 GM/15ML PO SOLN
20.0000 g | Freq: Every day | ORAL | Status: DC | PRN
  Administered 2024-01-05 – 2024-01-06 (×2): 20 g via ORAL
  Filled 2024-01-04 (×2): qty 30

## 2024-01-04 MED ORDER — TORSEMIDE 20 MG PO TABS
20.0000 mg | ORAL_TABLET | Freq: Every day | ORAL | Status: DC
  Administered 2024-01-04 – 2024-01-08 (×5): 20 mg via ORAL
  Filled 2024-01-04 (×5): qty 1

## 2024-01-04 NOTE — Progress Notes (Addendum)
 PROGRESS NOTE    Lisa [name]  ZOX:[id number] DOB: [birth year] DOA: 12/28/2023 PCP: Faustina Hood, MD  82/F with chronic systolic CHF, hypertension, hypothyroidism, chronic anemia, history of VT/VF arrest, CML, obesity, pleural effusion, lymphedema presented to the ED 5/21 following an ICD shock on 5/16. - Recently hospitalized in 4/25 with UTI had VT with ICD shock X3 in the ED, noted to be volume overloaded with recurrent pleural effusions at the time, underwent thoracentesis which was then complicated by hydropneumothorax required chest tube, this was eventually removed and discharged back home on 5/8. - She received a device alert for ICD shock that occurred on 5/18, patient was sleeping and unaware, reported having dizziness lightheadedness numbness and some right sided chest pain yesterday and presented to the ED. - Labs notable for potassium 3.9, mag 1.5, creatinine 2.1, hemoglobin 8.9, troponin 14, chest x-ray without acute findings, D-dimer was elevated at 8.1 - Admitted, seen by EP, amiodarone  dose increased, magnesium  repleted - VQ scan noted intermediate probability> CTA chest-right-sided PE, improvement in hydropneumothorax of left, started on IV heparin  5/22 - 5/25, having abdominal distention, nausea and vomiting - 5/26: Still with nausea and vomiting, starting to improve   Subjective: - Feels better, vomited once overnight, belly is less distended, still feels constipated  Assessment and Plan:  Recurrent VT/VF, sp ICD shock - EP following, amiodarone  dose increased to 200 Mg twice daily - Electrolytes are stable  Acute right-sided PE - Chest x-ray unremarkable, VQ scan indeterminate - CTA positive for PE, decrease in size of hydropneumothorax, left pleural effusion moderate - Remains on IV heparin , plan was for transition to oral Eliquis however she is due for generator change on Friday, with logistics and difficulty with managing subcu Lovenox  at home and insistence  on generator change this week will likely keep her inpatient till Friday - Will discuss with EP regarding short duration off anticoagulation after generator change  Constipation, abdominal distention, nausea and vomiting - KUB reassuring, continue lactulose, decrease frequency - Improving, advance diet  Acute on chronic systolic CHF - Last echo 4/27 with a EF 40-45%, grade 1 DD, normal RV - Restart diuretics  CAD - Stable, no ACS, continue aspirin  and statin  Acute on chronic anemia Hemoglobin lower than baseline, iron stores are adequate, likely hemodilution contributing as well - Monitor with diuresis, may need Aranesp  AKi/CKD3a - Improving, monitor  DVT prophylaxis: lovenox  Code Status: Full Family Communication: None present Disposition Plan: Home likely Friday  Consultants: EP   Procedures:   Antimicrobials:    Objective: Vitals:   01/04/24 0059 01/04/24 0400 01/04/24 0815 01/04/24 1039  BP: (!) 121/47 (!) 119/53 (!) 113/51 (!) 129/57  Pulse: 65 (!) 56 (!) 55 60  Resp: 18 18 18 18   Temp: 98.6 F (37 C) 98.6 F (37 C) (!) 97.5 F (36.4 C) 97.7 F (36.5 C)  TempSrc: Oral Oral Oral Oral  SpO2: 95% 99% 98% 99%  Weight:  66.8 kg    Height:        Intake/Output Summary (Last 24 hours) at 01/04/2024 1144 Last data filed at 01/04/2024 0814 Gross per 24 hour  Intake 1418.68 ml  Output 150 ml  Net 1268.68 ml   Filed Weights   01/02/24 0424 01/03/24 0416 01/04/24 0400  Weight: 65.2 kg 65.8 kg 66.8 kg    Examination:  Gen: Awake, Alert, Oriented X 3,  HEENT: no JVD Lungs: Decreased sounds at the bases CVS: S1S2/RRR Abd: soft, Non tender, less distended, BS present  Extremities: trace edema Skin: no new rashes on exposed skin     Data Reviewed:   CBC: Recent Labs  Lab 12/31/23 1245 01/01/24 0322 01/02/24 0323 01/03/24 0313 01/04/24 0308  WBC 6.6 7.5 11.1* 11.9* 8.3  HGB 8.6* 8.5* 10.1* 8.7* 7.7*  HCT 26.3* 26.4* 31.1* 26.5* 23.9*  MCV 98.1  97.8 96.6 96.7 96.4  PLT 282 239 351 313 261   Basic Metabolic Panel: Recent Labs  Lab 12/31/23 0230 01/01/24 0322 01/02/24 0323 01/03/24 0313 01/04/24 0308  NA 137 135 137 135 134*  K 4.0 3.5 3.5 4.0 4.0  CL 104 101 100 101 102  CO2 28 25 25 26 27   GLUCOSE 100* 113* 138* 119* 90  BUN 21 19 18 23  25*  CREATININE 1.44* 1.23* 1.37* 1.47* 1.56*  CALCIUM  8.4* 8.4* 8.8* 8.4* 8.0*  MG 2.0 1.8 2.7* 2.8* 2.5*   GFR: Estimated Creatinine Clearance: 24.7 mL/min (A) (by C-G formula based on SCr of 1.56 mg/dL (H)). Liver Function Tests: No results for input(s): "AST", "ALT", "ALKPHOS", "BILITOT", "PROT", "ALBUMIN " in the last 168 hours. No results for input(s): "LIPASE", "AMYLASE" in the last 168 hours. No results for input(s): "AMMONIA" in the last 168 hours. Coagulation Profile: No results for input(s): "INR", "PROTIME" in the last 168 hours. Cardiac Enzymes: No results for input(s): "CKTOTAL", "CKMB", "CKMBINDEX", "TROPONINI" in the last 168 hours. BNP (last 3 results) Recent Labs    12/04/23 1144  PROBNP 4,071.0*   HbA1C: No results for input(s): "HGBA1C" in the last 72 hours. CBG: Recent Labs  Lab 12/31/23 1723  GLUCAP 113*   Lipid Profile: No results for input(s): "CHOL", "HDL", "LDLCALC", "TRIG", "CHOLHDL", "LDLDIRECT" in the last 72 hours. Thyroid  Function Tests: No results for input(s): "TSH", "T4TOTAL", "FREET4", "T3FREE", "THYROIDAB" in the last 72 hours. Anemia Panel: Recent Labs    01/04/24 0855  RETICCTPCT 2.6   Urine analysis:    Component Value Date/Time   COLORURINE AMBER (A) 01/02/2024 0725   APPEARANCEUR HAZY (A) 01/02/2024 0725   LABSPEC 1.019 01/02/2024 0725   PHURINE 5.0 01/02/2024 0725   GLUCOSEU NEGATIVE 01/02/2024 0725   HGBUR NEGATIVE 01/02/2024 0725   BILIRUBINUR NEGATIVE 01/02/2024 0725   BILIRUBINUR negative 06/04/2016 0847   BILIRUBINUR neg 02/14/2015 1431   KETONESUR NEGATIVE 01/02/2024 0725   PROTEINUR NEGATIVE 01/02/2024 0725    UROBILINOGEN 0.2 06/04/2016 0847   NITRITE NEGATIVE 01/02/2024 0725   LEUKOCYTESUR NEGATIVE 01/02/2024 0725   Sepsis Labs: @LABRCNTIP (procalcitonin:4,lacticidven:4)  )No results found for this or any previous visit (from the past 240 hours).   Radiology Studies: No results found.    Scheduled Meds:  amiodarone   200 mg Oral BID   aspirin  EC  81 mg Oral q AM   atorvastatin   20 mg Oral Daily   lactulose  20 g Oral BID   lidocaine   1 patch Transdermal Q24H   magnesium  oxide  800 mg Oral Daily   thyroid   15 mg Oral QAC breakfast   timolol   1 drop Both Eyes BID   Continuous Infusions:  heparin  950 Units/hr (01/03/24 1656)   promethazine  (PHENERGAN ) injection (IM or IVPB) Stopped (01/01/24 2306)     LOS: 5 days    Time spent:    Deforest Fast, MD [facility]   01/04/2024, 11:44 AM

## 2024-01-04 NOTE — TOC Progression Note (Signed)
 Transition of Care [facility]) - Progression Note    Patient Details  Name: Lisa [name] MRN: [id number] Date of Birth: 06-26-1941  [facility] CM/SW Contact  Jennett Model, RN Phone Number: 01/04/2024, 12:45 PM  Clinical Narrative:    Pharmacist will do patient ast for eliquis for patient, she does not have prescription coverage and will need to be on eliquis.        Expected Discharge Plan and Services                                               Social Determinants of Health (SDOH) Interventions SDOH Screenings   Food Insecurity: No Food Insecurity (12/29/2023)  Housing: Low Risk  (12/29/2023)  Transportation Needs: No Transportation Needs (12/29/2023)  Utilities: Not At Risk (12/29/2023)  Financial Resource Strain: Low Risk  (10/28/2022)   [facility] from [facility], [facility]  Social Connections: Unknown (12/29/2023)  Recent Concern: Social Connections - Moderately Isolated (12/07/2023)  Tobacco Use: Low Risk  (12/30/2023)    Readmission Risk Interventions    08/10/2023    2:10 PM 07/19/2023    3:29 PM  Readmission Risk Prevention Plan  Transportation Screening Complete Complete  PCP or Specialist Appt within 5-7 Days  Complete  PCP or Specialist Appt within 3-5 Days Complete   Home Care Screening  Complete  Medication Review (RN CM)  Complete  HRI or Home Care Consult Complete   Palliative Care Screening Not Applicable   Medication Review (RN Care Manager) Complete

## 2024-01-04 NOTE — Progress Notes (Signed)
 Mobility Specialist Progress Note:   01/04/24 0920  Mobility  Activity Transferred to/from [facility]  Level of Assistance Contact guard assist, steadying assist  Assistive Device Other (Comment) (HHA)  Distance Ambulated (ft) 5 ft  Activity Response Tolerated well  Mobility Referral Yes  Mobility visit 1 Mobility  Mobility Specialist Start Time (ACUTE ONLY) 0920  Mobility Specialist Stop Time (ACUTE ONLY) 0930  Mobility Specialist Time Calculation (min) (ACUTE ONLY) 10 min   Pt agreeable to mobility session. Required HHA to transfer to [facility] to void. Pt declined further ambulation at this time. Back in chair with all needs met.   Oneda Big Mobility Specialist Please contact via SecureChat or  Rehab office at [phone]

## 2024-01-04 NOTE — Progress Notes (Signed)
 Occupational Therapy Treatment Patient Details Name: Lisa [name] MRN: [id number] DOB: 1941/08/02 Today's Date: 01/04/2024   History of present illness Patient is an 83 y/o female admitted 12/28/23 due to alert that had ICD shock on 5/18 and was encouraged to come to ED if any symptoms which she had R chest pain, light headedness and found to have acute R side PE and acute on chronic CHF.  PMH positive for CHF, HTN, hypothyroidism, chronic anemia, h/o VT/VF arrest, CML, obesity, pleural effusion, lymphedema.  Recent stay for pleural effusion s/p thoracentesis complicated by  hydropneumothorax and chest tube and found to have UTI.   OT comments  This 83 yo female seen today for ADLs and did well at a setup/S level from rollator level. She was completely safe with rollator use. She will continue to benefit from acute OT without need for follow up.      If plan is discharge home, recommend the following:  A little help with walking and/or transfers;A little help with bathing/dressing/bathroom   Equipment Recommendations  None recommended by OT       Precautions / Restrictions Precautions Precautions: Fall Precaution/Restrictions Comments: diarrhea/bowel urgency Restrictions Weight Bearing Restrictions Per Provider Order: No       Mobility Bed Mobility Overal bed mobility: Modified Independent Bed Mobility: Supine to Sit           General bed mobility comments: HOB up and use of rail    Transfers Overall transfer level: Needs assistance Equipment used: Rollator (4 wheels) Transfers: Sit to/from Stand Sit to Stand: Supervision           General transfer comment: pt very safe with rollator use     Balance Overall balance assessment: Needs assistance Sitting-balance support: No upper extremity supported, Feet supported Sitting balance-Leahy Scale: Good     Standing balance support: No upper extremity supported, During functional activity Standing balance-Leahy  Scale: Fair                             ADL either performed or assessed with clinical judgement   ADL Overall ADL's : Needs assistance/impaired     Grooming: Set up;Supervision/safety Grooming Details (indicate cue type and reason): standing at sink Upper Body Bathing: Set up;Supervision/ safety Upper Body Bathing Details (indicate cue type and reason): sitting at sink Lower Body Bathing: Sit to/from stand;Supervison/ safety Lower Body Bathing Details (indicate cue type and reason): sit<>stand at sink for washing front and back peri area       Lower Body Dressing Details (indicate cue type and reason): total A for socks --pt reports she does not put her own socks on, wears crocs Toilet Transfer: Supervision/safety;Ambulation;Rollator (4 wheels) Statistician Details (indicate cue type and reason): simulated bed>sink for ADLs>recliner                Extremity/Trunk Assessment Upper Extremity Assessment Upper Extremity Assessment: Overall WFL for tasks assessed            Vision Patient Visual Report: No change from baseline           Communication Communication Communication: No apparent difficulties   Cognition Arousal: Alert Behavior During Therapy: WFL for tasks assessed/performed Cognition: No apparent impairments                               Following commands: Intact  Pertinent Vitals/ Pain       Pain Assessment Pain Assessment: No/denies pain         Frequency  Min 2X/week        Progress Toward Goals  OT Goals(current goals can now be found in the care plan section)  Progress towards OT goals: Progressing toward goals  Acute Rehab OT Goals Patient Stated Goal: to continue to feel better OT Goal Formulation: With patient Time For Goal Achievement: 01/15/24 Potential to Achieve Goals: Good         AM-PAC OT "6 Clicks" Daily Activity     Outcome Measure   Help from another  person eating meals?: None Help from another person taking care of personal grooming?: A Little Help from another person toileting, which includes using toliet, bedpan, or urinal?: A Little Help from another person bathing (including washing, rinsing, drying)?: A Little Help from another person to put on and taking off regular upper body clothing?: A Little Help from another person to put on and taking off regular lower body clothing?: A Little 6 Click Score: 19    End of Session Equipment Utilized During Treatment: Rolling walker (2 wheels)  OT Visit Diagnosis: Other abnormalities of gait and mobility (R26.89);Muscle weakness (generalized) (M62.81)   Activity Tolerance Patient tolerated treatment well   Patient Left in chair;with call bell/phone within reach (no chair alarm in room--pt has not had one)           Time: 9563-8756 OT Time Calculation (min): 37 min  Charges: OT General Charges $OT Visit: 1 Visit OT Treatments $Self Care/Home Management : 23-37 mins  Lisa [name] OT Acute Rehabilitation Services Office [phone]    Lisa [name] 01/04/2024, 8:37 AM

## 2024-01-04 NOTE — Plan of Care (Signed)

## 2024-01-04 NOTE — Plan of Care (Signed)
  Problem: Education: Goal: Knowledge of General Education information will improve Description: Including pain rating scale, medication(s)/side effects and non-pharmacologic comfort measures Outcome: Progressing   Problem: Clinical Measurements: Goal: Will remain free from infection Outcome: Progressing   Problem: Nutrition: Goal: Adequate nutrition will be maintained Outcome: Progressing   

## 2024-01-04 NOTE — Progress Notes (Signed)
 Pt currently hospitalized and plan to change ICD battery on 5/30 during hospitalization.  ICM Remote Transmission rescheduled 7/21 following ICD change and to allow time for Optivol thoracic impedance to develop.

## 2024-01-04 NOTE — Progress Notes (Signed)
 PHARMACY - ANTICOAGULATION CONSULT NOTE  Pharmacy Consult for heparin  Indication: pulmonary embolus  Patient Measurements: Height: 5\' 1"  (154.9 cm) Weight: 66.8 kg (147 lb 3.2 oz) IBW/kg (Calculated) : 47.8 HEPARIN  DW (KG): 61.9  Vital Signs: Temp: 97.7 F (36.5 C) (05/27 1039) Temp Source: Oral (05/27 1039) BP: 129/57 (05/27 1039) Pulse Rate: 60 (05/27 1039)  Labs: Recent Labs    01/02/24 0323 01/03/24 0313 01/04/24 0308  HGB 10.1* 8.7* 7.7*  HCT 31.1* 26.5* 23.9*  PLT 351 313 261  HEPARINUNFRC 0.44 0.48 0.45  CREATININE 1.37* 1.47* 1.56*    Estimated Creatinine Clearance: 24.7 mL/min (A) (by C-G formula based on SCr of 1.56 mg/dL (H)).  Assessment: 83 y.o. F presents with recurrent VT/VF, s/p ICD shock. Found to have right lower lobe segmental and subsegmental pulmonary artery emboli. No evidence of right heart strain. Pt on no AC PTA. Has been on Lovenox  30mg  daily here - last dose 5/22~1245.  Heparin  level 0.45 is therapeutic with heparin  drip running at 950 units/hr. Hgb slightly lower 7.7 and PLTs 200-300 are stable. No noted bleeding or infusion issues  Goal of Therapy:  Heparin  level 0.3-0.7 units/ml Monitor platelets by anticoagulation protocol: Yes   Plan:  Continue heparin  drip at 950 units/hr Daily heparin  level, CBC  Monitor for s/sx of bleeding F/u transition to oral anticoagulation/plan for generator replacement    Hortensia Ma Pharm.D. CPP, BCPS Clinical Pharmacist [phone] 01/04/2024 11:21 AM    Please check AMION for all [facility] phone numbers After 10:00 PM, call Main Pharmacy [phone] 01/04/2024 11:20 AM

## 2024-01-05 ENCOUNTER — Telehealth: Payer: Self-pay | Admitting: Pharmacy Technician

## 2024-01-05 DIAGNOSIS — Z4502 Encounter for adjustment and management of automatic implantable cardiac defibrillator: Secondary | ICD-10-CM | POA: Diagnosis not present

## 2024-01-05 LAB — BASIC METABOLIC PANEL WITH GFR
Anion gap: 8 (ref 5–15)
BUN: 23 mg/dL (ref 8–23)
CO2: 27 mmol/L (ref 22–32)
Calcium: 8 mg/dL — ABNORMAL LOW (ref 8.9–10.3)
Chloride: 99 mmol/L (ref 98–111)
Creatinine, Ser: 1.54 mg/dL — ABNORMAL HIGH (ref 0.44–1.00)
GFR, Estimated: 34 mL/min — ABNORMAL LOW (ref >60)
Glucose, Bld: 105 mg/dL — ABNORMAL HIGH (ref 70–99)
Potassium: 3.8 mmol/L (ref 3.5–5.1)
Sodium: 134 mmol/L — ABNORMAL LOW (ref 135–145)

## 2024-01-05 LAB — HEPARIN LEVEL (UNFRACTIONATED): Heparin Unfractionated: 0.36 [IU]/mL (ref 0.30–0.70)

## 2024-01-05 LAB — MAGNESIUM: Magnesium: 2 mg/dL (ref 1.7–2.4)

## 2024-01-05 MED ORDER — DARBEPOETIN ALFA 100 MCG/0.5ML IJ SOSY
100.0000 ug | PREFILLED_SYRINGE | Freq: Once | INTRAMUSCULAR | Status: AC
  Administered 2024-01-05: 100 ug via SUBCUTANEOUS
  Filled 2024-01-05: qty 0.5

## 2024-01-05 NOTE — Progress Notes (Signed)
 Mobility Specialist Progress Note:   01/05/24 1430  Mobility  Activity Ambulated with assistance in hallway  Level of Assistance Standby assist, set-up cues, supervision of patient - no hands on  Assistive Device Four wheel walker  Distance Ambulated (ft) 200 ft  Activity Response Tolerated well  Mobility Referral Yes  Mobility visit 1 Mobility  Mobility Specialist Start Time (ACUTE ONLY) 1430  Mobility Specialist Stop Time (ACUTE ONLY) 1450  Mobility Specialist Time Calculation (min) (ACUTE ONLY) 20 min   Pt agreeable to mobility session. Required only supervision for safety. Pt did present with N/V this session, dry heaving a few times throughout session. Pt c/o generalized fatigue. Back in bed with all needs met.  Oneda Big Mobility Specialist Please contact via SecureChat or  Rehab office at [phone]

## 2024-01-05 NOTE — Plan of Care (Signed)
  Problem: Clinical Measurements: Goal: Respiratory complications will improve Outcome: Progressing Goal: Cardiovascular complication will be avoided Outcome: Progressing   Problem: Activity: Goal: Risk for activity intolerance will decrease Outcome: Progressing   Problem: Coping: Goal: Level of anxiety will decrease Outcome: Progressing   Problem: Elimination: Goal: Will not experience complications related to bowel motility Outcome: Progressing Goal: Will not experience complications related to urinary retention Outcome: Progressing   Problem: Safety: Goal: Ability to remain free from injury will improve Outcome: Progressing   Problem: Skin Integrity: Goal: Risk for impaired skin integrity will decrease Outcome: Progressing

## 2024-01-05 NOTE — Progress Notes (Signed)
 Physical Therapy Treatment Patient Details Name: Lisa [name] MRN: [id number] DOB: 08-30-1940 Today's Date: 01/05/2024   History of Present Illness Patient is an 83 y/o female admitted 12/28/23 due to alert that had ICD shock on 5/18 and was encouraged to come to ED if any symptoms which she had R chest pain, light headedness and found to have acute R side PE and acute on chronic CHF.  PMH positive for CHF, HTN, hypothyroidism, chronic anemia, h/o VT/VF arrest, CML, obesity, pleural effusion, lymphedema.  Recent stay for pleural effusion s/p thoracentesis complicated by  hydropneumothorax and chest tube and found to have UTI.    PT Comments  Patient progressing well towards goals of modified independent with transfers and ambulation. Able to incr her distance walking prior to needing a seated rest break due to feeling SOB. Will plan to practice stairs next session.     If plan is discharge home, recommend the following: Assist for transportation;Assistance with cooking/housework;Help with stairs or ramp for entrance   Can travel by private vehicle        Equipment Recommendations  None recommended by PT    Recommendations for Other Services       Precautions / Restrictions Precautions Precautions: Fall Recall of Precautions/Restrictions: Intact Restrictions Weight Bearing Restrictions Per Provider Order: No     Mobility  Bed Mobility Overal bed mobility: Modified Independent Bed Mobility: Supine to Sit     Supine to sit: Modified independent (Device/Increase time), HOB elevated     General bed mobility comments: HOB up    Transfers Overall transfer level: Needs assistance Equipment used: Rollator (4 wheels) Transfers: Sit to/from Stand Sit to Stand: Supervision   Step pivot transfers: Supervision       General transfer comment: pt very safe with rollator use, including for seated rest break on rollator    Ambulation/Gait Ambulation/Gait assistance:  Supervision Gait Distance (Feet): 150 Feet (seated rest, 40) Assistive device: Rollator (4 wheels) Gait Pattern/deviations: Step-through pattern, Decreased stride length Gait velocity: decreased Gait velocity interpretation: 1.31 - 2.62 ft/sec, indicative of limited community ambulator   General Gait Details: Pt ambulated with rollator with overall good safety.  took one seated rest break due to feeling SOB.  pt only needed  A for IV line management,   Stairs             Wheelchair Mobility     Tilt Bed    Modified Rankin (Stroke Patients Only)       Balance Overall balance assessment: Needs assistance Sitting-balance support: No upper extremity supported, Feet supported Sitting balance-Leahy Scale: Good     Standing balance support: No upper extremity supported, During functional activity Standing balance-Leahy Scale: Fair Standing balance comment: able to hold static stand without UE support; benefits from UE support in dynamic standing and ambulation                            Communication Communication Communication: No apparent difficulties  Cognition Arousal: Alert Behavior During Therapy: WFL for tasks assessed/performed   PT - Cognitive impairments: No apparent impairments                         Following commands: Intact      Cueing Cueing Techniques: Verbal cues, Visual cues  Exercises      General Comments        Pertinent Vitals/Pain Pain Assessment Pain Assessment: No/denies pain  Faces Pain Scale: No hurt    Home Living                          Prior Function            PT Goals (current goals can now be found in the care plan section) Acute Rehab PT Goals Patient Stated Goal: return home, setup for HHPT PT Goal Formulation: With patient/family Time For Goal Achievement: 01/14/24 Potential to Achieve Goals: Good Progress towards PT goals: Progressing toward goals    Frequency    Min  2X/week      PT Plan      Co-evaluation              AM-PAC PT "6 Clicks" Mobility   Outcome Measure  Help needed turning from your back to your side while in a flat bed without using bedrails?: None Help needed moving from lying on your back to sitting on the side of a flat bed without using bedrails?: None Help needed moving to and from a bed to a chair (including a wheelchair)?: A Little Help needed standing up from a chair using your arms (e.g., wheelchair or bedside chair)?: A Little Help needed to walk in hospital room?: A Little Help needed climbing 3-5 steps with a railing? : A Lot 6 Click Score: 19    End of Session   Activity Tolerance: Patient limited by fatigue Patient left: with call bell/phone within reach;in chair Nurse Communication: Mobility status PT Visit Diagnosis: Difficulty in walking, not elsewhere classified (R26.2);Muscle weakness (generalized) (M62.81)     Time: 1610-9604 PT Time Calculation (min) (ACUTE ONLY): 23 min  Charges:    $Gait Training: 23-37 mins PT General Charges $$ ACUTE PT VISIT: 1 Visit                      Gayle Kava, PT Acute Rehabilitation Services  Office [phone]    Guilford Leep 01/05/2024, 10:28 AM

## 2024-01-05 NOTE — Progress Notes (Signed)
 PROGRESS NOTE    Lisa [name]  QMV:[id number] DOB: 19-Jun-1941 DOA: 12/28/2023 PCP: Faustina Hood, MD  82/F with chronic systolic CHF, hypertension, hypothyroidism, chronic anemia, history of VT/VF arrest, CML, obesity, pleural effusion, lymphedema presented to the ED 5/21 following an ICD shock on 5/16. - Recently hospitalized in 4/25 with UTI had VT with ICD shock X3 in the ED, noted to be volume overloaded with recurrent pleural effusions at the time, underwent thoracentesis which was then complicated by hydropneumothorax required chest tube, this was eventually removed and discharged back home on 5/8. - She received a device alert for ICD shock that occurred on 5/18, patient was sleeping and unaware, reported having dizziness lightheadedness numbness and some right sided chest pain yesterday and presented to the ED. - Labs notable for potassium 3.9, mag 1.5, creatinine 2.1, hemoglobin 8.9, troponin 14, chest x-ray without acute findings, D-dimer was elevated at 8.1 - Admitted, seen by EP, amiodarone  dose increased, magnesium  repleted - VQ scan noted intermediate probability> CTA chest-right-sided PE, improvement in hydropneumothorax of left, started on IV heparin  5/22 - 5/25, having abdominal distention, nausea and vomiting - 5/26: Still with nausea and vomiting, starting to improve - 5/27: GI symptoms improved,,  after discussion with EP decided to keep as inpatient to avoid long interruptions in anticoagulation   Subjective: - Feels better today, no further nausea or vomiting, tolerated diet  Assessment and Plan:  Recurrent VT/VF, sp ICD shock - EP following, amiodarone  dose increased to 200 Mg twice daily - Electrolytes are stable  Acute right-sided PE - Chest x-ray unremarkable, VQ scan indeterminate - CTA positive for PE, decrease in size of hydropneumothorax, left pleural effusion moderate - Remains on IV heparin , plan was for transition to oral Eliquis however she is due  for generator change on Friday, with logistics and difficulty with managing subcu Lovenox  at home and insistence on generator change this week will  keep her inpatient till Friday - Discussed with EP, will restart anticoagulation 3 to 4 days after generator change with 5 Mg twice daily of Eliquis  Constipation, abdominal distention, nausea and vomiting - KUB reassuring, continue lactulose, decrease frequency - Improving, advance diet  Acute on chronic systolic CHF - Last echo 4/27 with a EF 40-45%, grade 1 DD, normal RV - Continue oral torsemide   CAD - Stable, no ACS, continue aspirin  and statin  Acute on chronic anemia Hemoglobin lower than baseline, iron stores are adequate, likely hemodilution contributing as well -Also has CML - Monitor with diuresis, may need Aranesp  AKi/CKD3a - Improving, monitor  DVT prophylaxis: lovenox  Code Status: Full Family Communication: None present Disposition Plan: Home likely Friday  Consultants: EP   Procedures:   Antimicrobials:    Objective: Vitals:   01/04/24 1518 01/04/24 1954 01/05/24 0014 01/05/24 0602  BP: (!) 137/58 (!) 129/53 (!) 122/48 (!) 130/56  Pulse: 62 61 61 61  Resp: 18 18 17 18   Temp: 97.8 F (36.6 C) 98.5 F (36.9 C) 98.3 F (36.8 C) 98.8 F (37.1 C)  TempSrc: Oral Oral Oral Oral  SpO2:  96% 97% 98%  Weight:      Height:        Intake/Output Summary (Last 24 hours) at 01/05/2024 1049 Last data filed at 01/05/2024 0700 Gross per 24 hour  Intake 789.31 ml  Output 1500 ml  Net -710.69 ml   Filed Weights   01/02/24 0424 01/03/24 0416 01/04/24 0400  Weight: 65.2 kg 65.8 kg 66.8 kg    Examination:  Gen: Awake, Alert, Oriented X 3,  HEENT: no JVD Lungs: Decreased sounds at the bases CVS: S1S2/RRR Abd: soft, Non tender, less distended, BS present Extremities: trace edema Skin: Chronically dry skin with scaling    Data Reviewed:   CBC: Recent Labs  Lab 12/31/23 1245 01/01/24 0322  01/02/24 0323 01/03/24 0313 01/04/24 0308  WBC 6.6 7.5 11.1* 11.9* 8.3  HGB 8.6* 8.5* 10.1* 8.7* 7.7*  HCT 26.3* 26.4* 31.1* 26.5* 23.9*  MCV 98.1 97.8 96.6 96.7 96.4  PLT 282 239 351 313 261   Basic Metabolic Panel: Recent Labs  Lab 01/01/24 0322 01/02/24 0323 01/03/24 0313 01/04/24 0308 01/05/24 0226  NA 135 137 135 134* 134*  K 3.5 3.5 4.0 4.0 3.8  CL 101 100 101 102 99  CO2 25 25 26 27 27   GLUCOSE 113* 138* 119* 90 105*  BUN 19 18 23  25* 23  CREATININE 1.23* 1.37* 1.47* 1.56* 1.54*  CALCIUM  8.4* 8.8* 8.4* 8.0* 8.0*  MG 1.8 2.7* 2.8* 2.5* 2.0   GFR: Estimated Creatinine Clearance: 25.1 mL/min (A) (by C-G formula based on SCr of 1.54 mg/dL (H)). Liver Function Tests: No results for input(s): "AST", "ALT", "ALKPHOS", "BILITOT", "PROT", "ALBUMIN " in the last 168 hours. No results for input(s): "LIPASE", "AMYLASE" in the last 168 hours. No results for input(s): "AMMONIA" in the last 168 hours. Coagulation Profile: No results for input(s): "INR", "PROTIME" in the last 168 hours. Cardiac Enzymes: No results for input(s): "CKTOTAL", "CKMB", "CKMBINDEX", "TROPONINI" in the last 168 hours. BNP (last 3 results) Recent Labs    12/04/23 1144  PROBNP 4,071.0*   HbA1C: No results for input(s): "HGBA1C" in the last 72 hours. CBG: Recent Labs  Lab 12/31/23 1723  GLUCAP 113*   Lipid Profile: No results for input(s): "CHOL", "HDL", "LDLCALC", "TRIG", "CHOLHDL", "LDLDIRECT" in the last 72 hours. Thyroid  Function Tests: No results for input(s): "TSH", "T4TOTAL", "FREET4", "T3FREE", "THYROIDAB" in the last 72 hours. Anemia Panel: Recent Labs    01/04/24 0855  VITAMINB12 530  FOLATE 6.1  FERRITIN 444*  TIBC 199*  IRON 133  RETICCTPCT 2.6   Urine analysis:    Component Value Date/Time   COLORURINE AMBER (A) 01/02/2024 0725   APPEARANCEUR HAZY (A) 01/02/2024 0725   LABSPEC 1.019 01/02/2024 0725   PHURINE 5.0 01/02/2024 0725   GLUCOSEU NEGATIVE 01/02/2024 0725    HGBUR NEGATIVE 01/02/2024 0725   BILIRUBINUR NEGATIVE 01/02/2024 0725   BILIRUBINUR negative 06/04/2016 0847   BILIRUBINUR neg 02/14/2015 1431   KETONESUR NEGATIVE 01/02/2024 0725   PROTEINUR NEGATIVE 01/02/2024 0725   UROBILINOGEN 0.2 06/04/2016 0847   NITRITE NEGATIVE 01/02/2024 0725   LEUKOCYTESUR NEGATIVE 01/02/2024 0725   Sepsis Labs: @LABRCNTIP (procalcitonin:4,lacticidven:4)  )No results found for this or any previous visit (from the past 240 hours).   Radiology Studies: No results found.    Scheduled Meds:  amiodarone   200 mg Oral BID   aspirin  EC  81 mg Oral q AM   atorvastatin   20 mg Oral Daily   lidocaine   1 patch Transdermal Q24H   magnesium  oxide  800 mg Oral Daily   thyroid   15 mg Oral QAC breakfast   timolol   1 drop Both Eyes BID   torsemide   20 mg Oral Daily   Continuous Infusions:  heparin  950 Units/hr (01/04/24 1951)   promethazine  (PHENERGAN ) injection (IM or IVPB) Stopped (01/01/24 2306)     LOS: 6 days    Time spent:    Deforest Fast, MD [facility]  01/05/2024, 10:49 AM

## 2024-01-05 NOTE — Telephone Encounter (Signed)
 PAP: Application for Eliquis has been submitted to [employer] ([employer]), via fax

## 2024-01-05 NOTE — Progress Notes (Signed)
 PHARMACY - ANTICOAGULATION CONSULT NOTE  Pharmacy Consult for heparin  Indication: pulmonary embolus  Patient Measurements: Height: 5\' 1"  (154.9 cm) Weight: 66.8 kg (147 lb 3.2 oz) IBW/kg (Calculated) : 47.8 HEPARIN  DW (KG): 61.9  Vital Signs: Temp: 98.8 F (37.1 C) (05/28 0602) Temp Source: Oral (05/28 0602) BP: 130/56 (05/28 0602) Pulse Rate: 61 (05/28 0602)  Labs: Recent Labs    01/03/24 0313 01/04/24 0308 01/05/24 0226  HGB 8.7* 7.7*  --   HCT 26.5* 23.9*  --   PLT 313 261  --   HEPARINUNFRC 0.48 0.45 0.36  CREATININE 1.47* 1.56* 1.54*    Estimated Creatinine Clearance: 25.1 mL/min (A) (by C-G formula based on SCr of 1.54 mg/dL (H)).  Assessment: 83 y.o. F presents with recurrent VT/VF, s/p ICD shock. Found to have right lower lobe segmental and subsegmental pulmonary artery emboli. No evidence of right heart strain. Pt on no AC PTA. Has been on Lovenox  30mg  daily here - last dose 5/22~1245.  ICD gen change planned for 5/30 - plan to restart anticoagulation 3-4d post procedure with Eliquis 5 mg BID (see separate note from today regarding change in outpatient chemo medication to Scemblix which will not interact with DOACs).  Patient assistance forms in progress.  -Heparin  level (0.36) is therapeutic on 950 units/hr. -CBC for today pending.  No noted bleeding or infusion issues  Goal of Therapy:  Heparin  level 0.3-0.7 units/ml Monitor platelets by anticoagulation protocol: Yes   Plan:  Continue heparin  drip at 950 units/hr Daily heparin  level, CBC  Monitor for s/sx of bleeding F/u transition to oral anticoagulation/plan for generator replacement  Cecillia Cogan, PharmD Clinical Pharmacist 01/05/2024  11:35 AM

## 2024-01-05 NOTE — Plan of Care (Signed)
  Problem: Education: Goal: Knowledge of General Education information will improve Description: Including pain rating scale, medication(s)/side effects and non-pharmacologic comfort measures Outcome: Progressing   Problem: Clinical Measurements: Goal: Ability to maintain clinical measurements within normal limits will improve Outcome: Progressing   Problem: Clinical Measurements: Goal: Will remain free from infection Outcome: Progressing   Problem: Clinical Measurements: Goal: Diagnostic test results will improve Outcome: Progressing   Problem: Clinical Measurements: Goal: Respiratory complications will improve Outcome: Progressing   Problem: Activity: Goal: Risk for activity intolerance will decrease Outcome: Progressing

## 2024-01-05 NOTE — Progress Notes (Signed)
-   Per patient she has been on/off imatinib (Gleevec ) - followed by Dr Ada Acres and PA Perrin Brakeman at [facility].  Concern for oral anticoagulation and drug interactions with imatinib    Follow up per oncology at [facility] - they were planning on changing her from imatinib   to Asciminib (Scemblix) anyway - this product  does not interfere with apixaban so we are good to start this medication after cleared from EP post ICD generator change - possibly 3-4 days after per EP  Because patient has been on IV anticoagulation sine 5/22 and concern for post ICD pocket bleeding - I would suggest starting apixaban 5mg  BID and not do the 10 bid initial load as when starting without IV anticoagulation.   Patient can use 30d free copay card at DC for first apixaban fill She does not have medication insurance and patient assistance has been started for her for future fill.  Hortensia Ma Pharm.D. CPP, BCPS Clinical Pharmacist [phone] 01/05/2024 10:44 AM

## 2024-01-06 ENCOUNTER — Inpatient Hospital Stay (HOSPITAL_COMMUNITY)

## 2024-01-06 DIAGNOSIS — Z4502 Encounter for adjustment and management of automatic implantable cardiac defibrillator: Secondary | ICD-10-CM | POA: Diagnosis not present

## 2024-01-06 DIAGNOSIS — R609 Edema, unspecified: Secondary | ICD-10-CM

## 2024-01-06 LAB — BASIC METABOLIC PANEL WITH GFR
Anion gap: 9 (ref 5–15)
BUN: 21 mg/dL (ref 8–23)
CO2: 28 mmol/L (ref 22–32)
Calcium: 8.3 mg/dL — ABNORMAL LOW (ref 8.9–10.3)
Chloride: 100 mmol/L (ref 98–111)
Creatinine, Ser: 1.54 mg/dL — ABNORMAL HIGH (ref 0.44–1.00)
GFR, Estimated: 34 mL/min — ABNORMAL LOW (ref >60)
Glucose, Bld: 118 mg/dL — ABNORMAL HIGH (ref 70–99)
Potassium: 3.3 mmol/L — ABNORMAL LOW (ref 3.5–5.1)
Sodium: 137 mmol/L (ref 135–145)

## 2024-01-06 LAB — CBC
HCT: 25 % — ABNORMAL LOW (ref 36.0–46.0)
Hemoglobin: 8.3 g/dL — ABNORMAL LOW (ref 12.0–15.0)
MCH: 31.9 pg (ref 26.0–34.0)
MCHC: 33.2 g/dL (ref 30.0–36.0)
MCV: 96.2 fL (ref 80.0–100.0)
Platelets: 284 10*3/uL (ref 150–400)
RBC: 2.6 MIL/uL — ABNORMAL LOW (ref 3.87–5.11)
RDW: 15.2 % (ref 11.5–15.5)
WBC: 8 10*3/uL (ref 4.0–10.5)
nRBC: 0.3 % — ABNORMAL HIGH (ref 0.0–0.2)

## 2024-01-06 LAB — MAGNESIUM: Magnesium: 1.7 mg/dL (ref 1.7–2.4)

## 2024-01-06 LAB — HEPARIN LEVEL (UNFRACTIONATED): Heparin Unfractionated: 0.34 [IU]/mL (ref 0.30–0.70)

## 2024-01-06 MED ORDER — CHLORHEXIDINE GLUCONATE 4 % EX SOLN
4.0000 | Freq: Once | CUTANEOUS | Status: AC
  Administered 2024-01-06: 4 via TOPICAL
  Filled 2024-01-06: qty 60

## 2024-01-06 MED ORDER — POTASSIUM CHLORIDE 20 MEQ PO PACK
40.0000 meq | PACK | ORAL | Status: DC
  Filled 2024-01-06: qty 2

## 2024-01-06 MED ORDER — MAGNESIUM SULFATE 4 GM/100ML IV SOLN
4.0000 g | Freq: Once | INTRAVENOUS | Status: AC
  Administered 2024-01-06: 4 g via INTRAVENOUS
  Filled 2024-01-06: qty 100

## 2024-01-06 MED ORDER — POTASSIUM CHLORIDE CRYS ER 20 MEQ PO TBCR
40.0000 meq | EXTENDED_RELEASE_TABLET | ORAL | Status: AC
  Administered 2024-01-06 (×2): 40 meq via ORAL
  Filled 2024-01-06 (×2): qty 2

## 2024-01-06 MED ORDER — CEFAZOLIN SODIUM-DEXTROSE 2-4 GM/100ML-% IV SOLN
2.0000 g | INTRAVENOUS | Status: AC
Start: 2024-01-07 — End: 2024-01-08
  Administered 2024-01-07: 2 g via INTRAVENOUS

## 2024-01-06 MED ORDER — POVIDONE-IODINE 10 % EX SWAB: 2.0000 | Freq: Once | CUTANEOUS | Status: DC

## 2024-01-06 MED ORDER — SODIUM CHLORIDE 0.9 % IV SOLN
80.0000 mg | INTRAVENOUS | Status: AC
  Administered 2024-01-07: 80 mg
  Filled 2024-01-06: qty 2

## 2024-01-06 MED ORDER — SODIUM CHLORIDE 0.9 % IV SOLN: INTRAVENOUS | Status: DC

## 2024-01-06 NOTE — Telephone Encounter (Signed)
 Message sent to the [place] office to see of they can print off the forms under media and have DR Avanell Bob sign so we can send in to be processed.

## 2024-01-06 NOTE — Progress Notes (Addendum)
 PHARMACY - ANTICOAGULATION CONSULT NOTE  Pharmacy Consult for heparin  Indication: pulmonary embolus  Patient Measurements: Height: 5\' 1"  (154.9 cm) Weight: 67.2 kg (148 lb 2.4 oz) IBW/kg (Calculated) : 47.8 HEPARIN  DW (KG): 61.9  Vital Signs: Temp: 97.8 F (36.6 C) (05/29 0335) Temp Source: Oral (05/29 0335) BP: 146/52 (05/29 0335) Pulse Rate: 66 (05/29 0335)  Labs: Recent Labs    01/04/24 0308 01/05/24 0226 01/06/24 0247  HGB 7.7*  --  8.3*  HCT 23.9*  --  25.0*  PLT 261  --  284  HEPARINUNFRC 0.45 0.36 0.34  CREATININE 1.56* 1.54* 1.54*    Estimated Creatinine Clearance: 25.1 mL/min (A) (by C-G formula based on SCr of 1.54 mg/dL (H)).  Assessment: 83 y.o. F presents with recurrent VT/VF, s/p ICD shock. Found to have right lower lobe segmental and subsegmental pulmonary artery emboli. No evidence of right heart strain. Pt on no AC PTA. Has been on Lovenox  30mg  daily here - last dose 5/22~1245.  ICD gen change planned for 5/30 - plan to restart anticoagulation 3-4d post procedure with Eliquis 5 mg BID (see separate note from today regarding change in outpatient chemo medication to Scemblix which will not interact with DOACs).  Patient assistance forms in progress.  -Heparin  level (0.34) is therapeutic and stable on 950 units/hr. -CBC stable.  No noted bleeding or infusion issues  Goal of Therapy:  Heparin  level 0.3-0.7 units/ml Monitor platelets by anticoagulation protocol: Yes   Plan:  Continue heparin  drip at 950 units/hr Daily heparin  level, CBC  Monitor for s/sx of bleeding F/u transition to oral anticoagulation/plan for generator replacement  Addendum - heparin  to stop at midnight tonight for gen change tomorrow.  EP to decide on timing of DOAC restart post procedure.  Cecillia Cogan, PharmD Clinical Pharmacist 01/06/2024  6:51 AM

## 2024-01-06 NOTE — Progress Notes (Signed)
  Patient Name: Lisa [name] Date of Encounter: 01/06/2024  Primary Cardiologist: Ola Berger, MD Electrophysiologist: Manya Sells, MD  Interval Summary   The patient is doing well today.  She is hopeful to go home after her generator change tomorrow.  She notes she has to coordinate her home health at discharge.    At this time, the patient denies chest pain, shortness of breath, or any new concerns.  Vital Signs    Vitals:   01/05/24 2305 01/05/24 2310 01/06/24 0335 01/06/24 0338  BP: (!) 147/64 (!) 148/62 (!) 146/52   Pulse:   66   Resp:   18   Temp:  98.8 F (37.1 C) 97.8 F (36.6 C)   TempSrc:  Oral Oral   SpO2:  99% 98%   Weight:    67.2 kg  Height:        Intake/Output Summary (Last 24 hours) at 01/06/2024 0946 Last data filed at 01/06/2024 0900 Gross per 24 hour  Intake 669.86 ml  Output 400 ml  Net 269.86 ml   Filed Weights   01/03/24 0416 01/04/24 0400 01/06/24 0338  Weight: 65.8 kg 66.8 kg 67.2 kg    Physical Exam    GEN- The patient is well appearing, alert and oriented x 3 today.   Lungs- Clear to ausculation bilaterally, normal work of breathing Cardiac- Regular rate and rhythm, no murmurs, rubs or gallops GI- soft, NT, ND, + BS Extremities- no clubbing or cyanosis. No edema  Telemetry    VP 60-80's (personally reviewed)  Hospital Course    [name] is a 83 y.o. female with PMH of HFrEF, VT/VF arrest s/p ICD, CML, HTN, obesity, hypothyroidism, anemia admitted 12/29/23 for right sided chest pain.  She had a recent admit for UTI with ICD shock x3 during that admit. She had recurrent pleural effusions during that admit and underwent thoracentesis with residual hydropneumothorax s/p chest tube. She came back with right chest pain. CTA chest showed right sided PE. She was started on IV heparin .    Assessment & Plan    ICD in Situ  At [facility]  -NPO after MN for generator change 01/06/34  -stop heparin  at MN for procedure  -pre-procedure  orders in place   Segmental / Sub-Segmental PE  No RV strain, hx CLL  -per [facility]  -EP will confirm how may days post generator change on 5/30 to hold before starting DOAC  -await LE US    Recurrent VT  -continue amiodarone  200 mg daily  -goal K+>4, Mg+>2    CLL  -per [facility]   HFrEF  -per [facility]    For questions or updates, please contact [facility] Please consult [email or web address] for contact info under Cardiology/STEMI.  Signed, Creighton Doffing, NP-C, AGACNP-BC [facility] - Electrophysiology  01/06/2024, 9:47 AM

## 2024-01-06 NOTE — Plan of Care (Signed)

## 2024-01-06 NOTE — Plan of Care (Signed)

## 2024-01-06 NOTE — Progress Notes (Signed)
 Occupational Therapy Treatment Patient Details Name: Lisa [name] MRN: [id number] DOB: 20-Nov-1940 Today's Date: 01/06/2024   History of present illness Patient is an 83 y/o female admitted 12/28/23 due to alert that had ICD shock on 5/18 and was encouraged to come to ED if any symptoms which she had R chest pain, light headedness and found to have acute R side PE and acute on chronic CHF.  PMH positive for CHF, HTN, hypothyroidism, chronic anemia, h/o VT/VF arrest, CML, obesity, pleural effusion, lymphedema.  Recent stay for pleural effusion s/p thoracentesis complicated by  hydropneumothorax and chest tube and found to have UTI.   OT comments  Patient demonstrating good gains with OT treatment with mod I for bed mobility and supervision for transfers and mobility with rollator walker. Patient performed self care standing at sink without seated rest break and once in recliner stated she felt dizzy with BP at 141/67 (86). Discharge recommendations continue to be appropriate. Acute OT to continue to follow to address established goals.       If plan is discharge home, recommend the following:  A little help with walking and/or transfers;A little help with bathing/dressing/bathroom   Equipment Recommendations  None recommended by OT    Recommendations for Other Services      Precautions / Restrictions Precautions Precautions: Fall Recall of Precautions/Restrictions: Intact Restrictions Weight Bearing Restrictions Per Provider Order: No       Mobility Bed Mobility Overal bed mobility: Modified Independent Bed Mobility: Supine to Sit           General bed mobility comments: HOB up    Transfers Overall transfer level: Needs assistance Equipment used: Rollator (4 wheels) Transfers: Sit to/from Stand Sit to Stand: Supervision           General transfer comment: performed transfers and mobility with rollator walker and supervision     Balance Overall balance  assessment: Needs assistance Sitting-balance support: No upper extremity supported, Feet supported Sitting balance-Leahy Scale: Good Sitting balance - Comments: EOB   Standing balance support: No upper extremity supported, During functional activity Standing balance-Leahy Scale: Fair Standing balance comment: able to stand at sink for self care task with no UE support                           ADL either performed or assessed with clinical judgement   ADL Overall ADL's : Needs assistance/impaired Eating/Feeding: Set up   Grooming: Wash/dry hands;Wash/dry face;Oral care;Set up;Standing Grooming Details (indicate cue type and reason): standing at sink Upper Body Bathing: Set up;Supervision/ safety;Standing Upper Body Bathing Details (indicate cue type and reason): standing at sink Lower Body Bathing: Sit to/from stand;Supervison/ safety Lower Body Bathing Details (indicate cue type and reason): stood at sink for peri care bathing Upper Body Dressing : Set up;Sitting Upper Body Dressing Details (indicate cue type and reason): change gowns   Lower Body Dressing Details (indicate cue type and reason): total assist with socks Toilet Transfer: Supervision/safety;BSC/3in1 Toilet Transfer Details (indicate cue type and reason): transfer from EOB to [facility] with rollator and supervision Toileting- Clothing Manipulation and Hygiene: Supervision/safety;Sit to/from stand Toileting - Clothing Manipulation Details (indicate cue type and reason): able to perform toilet hygiene while standing with rollator for support     Functional mobility during ADLs: Supervision/safety;Rollator (4 wheels)      Extremity/Trunk Assessment              Vision  Perception     Praxis     Communication Communication Communication: No apparent difficulties   Cognition Arousal: Alert Behavior During Therapy: WFL for tasks assessed/performed Cognition: No apparent impairments              OT - Cognition Comments: AAOx4 and pleasant throughout session.                 Following commands: Intact        Cueing   Cueing Techniques: Verbal cues, Visual cues  Exercises      Shoulder Instructions       General Comments complained of dizziness after standing at sink for self care with BP 141/67 (86)    Pertinent Vitals/ Pain       Pain Assessment Pain Assessment: No/denies pain Pain Intervention(s): Monitored during session  Home Living                                          Prior Functioning/Environment              Frequency  Min 2X/week        Progress Toward Goals  OT Goals(current goals can now be found in the care plan section)  Progress towards OT goals: Progressing toward goals  Acute Rehab OT Goals Patient Stated Goal: to feel better OT Goal Formulation: With patient Time For Goal Achievement: 01/15/24 Potential to Achieve Goals: Good ADL Goals Pt Will Perform Grooming: with modified independence;standing Pt Will Perform Upper Body Bathing: with modified independence;sitting Pt Will Perform Lower Body Bathing: with modified independence;sitting/lateral leans;sit to/from stand Pt Will Perform Lower Body Dressing: with modified independence;sit to/from stand;sitting/lateral leans Pt Will Transfer to Toilet: with modified independence;ambulating;regular height toilet (with least restrictive AD) Pt Will Perform Toileting - Clothing Manipulation and hygiene: with modified independence;sit to/from stand;sitting/lateral leans Pt/caregiver will Perform Home Exercise Program: Increased strength;Both right and left upper extremity;Independently;With written HEP provided  Plan      Co-evaluation                 AM-PAC OT "6 Clicks" Daily Activity     Outcome Measure   Help from another person eating meals?: None Help from another person taking care of personal grooming?: A Little Help from another person  toileting, which includes using toliet, bedpan, or urinal?: A Little Help from another person bathing (including washing, rinsing, drying)?: A Little Help from another person to put on and taking off regular upper body clothing?: A Little Help from another person to put on and taking off regular lower body clothing?: A Little 6 Click Score: 19    End of Session Equipment Utilized During Treatment: Rollator (4 wheels)  OT Visit Diagnosis: Other abnormalities of gait and mobility (R26.89);Muscle weakness (generalized) (M62.81)   Activity Tolerance Patient tolerated treatment well   Patient Left in chair;with call bell/phone within reach   Nurse Communication Mobility status        Time: 4098-1191 OT Time Calculation (min): 35 min  Charges: OT General Charges $OT Visit: 1 Visit OT Treatments $Self Care/Home Management : 23-37 mins  Anitra Barn, OTA Acute Rehabilitation Services  Office [phone]   Jovita Nipper 01/06/2024, 9:47 AM

## 2024-01-06 NOTE — Progress Notes (Signed)
 Mobility Specialist Progress Note:   01/06/24 1200  Mobility  Activity Ambulated with assistance in hallway  Level of Assistance Standby assist, set-up cues, supervision of patient - no hands on  Assistive Device Four wheel walker  Distance Ambulated (ft) 200 ft  Activity Response Tolerated well  Mobility Referral Yes  Mobility visit 1 Mobility  Mobility Specialist Start Time (ACUTE ONLY) 1200  Mobility Specialist Stop Time (ACUTE ONLY) 1215  Mobility Specialist Time Calculation (min) (ACUTE ONLY) 15 min   Pt agreeable to mobility session. Required no physical assistance throughout ambulation with rollator. X1 seated rest break taken d/t fatigue. No N/V this session. Pt back in chair with all needs met.   Lisa [name] Mobility Specialist Please contact via SecureChat or  Rehab office at [phone]

## 2024-01-06 NOTE — Progress Notes (Signed)
 Physical Therapy Treatment Patient Details Name: Lisa [name] MRN: [id number] DOB: 1940/08/25 Today's Date: 01/06/2024   History of Present Illness Patient is an 83 y/o female admitted 12/28/23 due to alert that had ICD shock on 5/18 and was encouraged to come to ED if any symptoms which she had R chest pain, light headedness and found to have acute R side PE and acute on chronic CHF.  PMH positive for CHF, HTN, hypothyroidism, chronic anemia, h/o VT/VF arrest, CML, obesity, pleural effusion, lymphedema.  Recent stay for pleural effusion s/p thoracentesis complicated by  hydropneumothorax and chest tube and found to have UTI.    PT Comments  Patient seen for stair training at bedside. Was able to ascend/descend 2 steps with min assist. Became lightheaded and SOB and returned to sitting. BP 75/55. Called for RN assistance. After no assistance, called again and took BP 80/58 with pt still feeling faint and SOB. Assisted to bed stand-pivot min assist and upon lying supine, she began to vomit and helped her turn on her side and elevated HOB. BP 129/79. Patient very upset that this may mean she cannot go home tomorrow after the procedure because she's afraid she will not be able to get up her 3 steps. Discussed if she's ever gone home by medical transport and she says she has. Will attempt to see pt 5/30 pm after procedure for additional stair training, however likely pt will need medical transport if goal is indeed to discharge 5/30 pm.    If plan is discharge home, recommend the following: Assist for transportation;Assistance with cooking/housework;Help with stairs or ramp for entrance   Can travel by private vehicle        Equipment Recommendations  Other (comment) (medical transport)    Recommendations for Other Services       Precautions / Restrictions Precautions Precautions: Fall Recall of Precautions/Restrictions: Intact Precaution/Restrictions Comments: diarrhea/bowel  urgency Restrictions Weight Bearing Restrictions Per Provider Order: No     Mobility  Bed Mobility   Bed Mobility: Sit to Supine       Sit to supine: Mod assist   General bed mobility comments: feeling faint and needed assist with legs up onto bed    Transfers Overall transfer level: Needs assistance Equipment used: Rollator (4 wheels) Transfers: Sit to/from Stand Sit to Stand: Supervision, Min assist   Step pivot transfers: Supervision, Min assist       General transfer comment: chair <> BSC, rollator to bed: pivot without device    Ambulation/Gait Ambulation/Gait assistance: Supervision Gait Distance (Feet): 4 Feet Assistive device: Rollator (4 wheels) Gait Pattern/deviations: Step-through pattern, Decreased stride length Gait velocity: decreased     General Gait Details: ambulated in room to practice step at bedside   Stairs Stairs: Yes Stairs assistance: Min assist Stair Management: One rail Right, Forwards (HHA on L simulating cane) Number of Stairs: 1 (x2) General stair comments: began feeling dizzy after 2, returned to sitting on rollator;  BP dropped, assisted back to bed   Wheelchair Mobility     Tilt Bed    Modified Rankin (Stroke Patients Only)       Balance Overall balance assessment: Needs assistance Sitting-balance support: No upper extremity supported, Feet supported Sitting balance-Leahy Scale: Good Sitting balance - Comments: EOB   Standing balance support: No upper extremity supported, During functional activity Standing balance-Leahy Scale: Fair Standing balance comment: able to stand at sink for self care task with no UE support  Communication Communication Communication: No apparent difficulties  Cognition Arousal: Alert Behavior During Therapy: WFL for tasks assessed/performed   PT - Cognitive impairments: No apparent impairments                         Following  commands: Intact      Cueing Cueing Techniques: Verbal cues, Visual cues  Exercises      General Comments General comments (skin integrity, edema, etc.): After return to bed due to low BP, pt began vomiting. Had her turn on her side and elevated HOB to decr risk of aspiratiion. BP stabilized      Pertinent Vitals/Pain Pain Assessment Pain Assessment: No/denies pain    Home Living                          Prior Function            PT Goals (current goals can now be found in the care plan section) Acute Rehab PT Goals Patient Stated Goal: return home, setup for HHPT Time For Goal Achievement: 01/14/24 Potential to Achieve Goals: Good Progress towards PT goals: Progressing toward goals    Frequency    Min 2X/week      PT Plan      Co-evaluation              AM-PAC PT "6 Clicks" Mobility   Outcome Measure  Help needed turning from your back to your side while in a flat bed without using bedrails?: None Help needed moving from lying on your back to sitting on the side of a flat bed without using bedrails?: None Help needed moving to and from a bed to a chair (including a wheelchair)?: A Little Help needed standing up from a chair using your arms (e.g., wheelchair or bedside chair)?: A Little Help needed to walk in hospital room?: A Little Help needed climbing 3-5 steps with a railing? : Total 6 Click Score: 18    End of Session Equipment Utilized During Treatment: Gait belt Activity Tolerance: Treatment limited secondary to medical complications (Comment) (hypotension) Patient left: with call bell/phone within reach;in bed Nurse Communication: Other (comment) (Low BP, vomited) PT Visit Diagnosis: Difficulty in walking, not elsewhere classified (R26.2);Muscle weakness (generalized) (M62.81)     Time: 1610-9604 PT Time Calculation (min) (ACUTE ONLY): 63 min  Charges:    $Gait Training: 8-22 mins $Therapeutic Activity: 23-37 mins PT General  Charges $$ ACUTE PT VISIT: 1 Visit                      Gayle Kava, PT Acute Rehabilitation Services  Office [phone]    Guilford Leep 01/06/2024, 2:41 PM

## 2024-01-06 NOTE — Telephone Encounter (Signed)
 Forms are under Dr Carolynne Citron per the [facility] office I will send to [facility] to see if she can get Dr Carolynne Citron to sign.

## 2024-01-06 NOTE — Progress Notes (Addendum)
 PROGRESS NOTE    Lisa [name]  ZOX:[id number] DOB: [birth year] DOA: 12/28/2023 PCP: Faustina Hood, MD  82/F with chronic systolic CHF, hypertension, hypothyroidism, chronic anemia, history of VT/VF arrest, CML, obesity, pleural effusion, lymphedema presented to the ED 5/21 following an ICD shock on 5/16. - Recently hospitalized in 4/25 with UTI had VT with ICD shock X3 in the ED, noted to be volume overloaded with recurrent pleural effusions at the time, underwent thoracentesis which was then complicated by hydropneumothorax required chest tube, this was eventually removed and discharged back home on 5/8. - She received a device alert for ICD shock that occurred on 5/18, patient was sleeping and unaware, reported having dizziness lightheadedness numbness and some right sided chest pain yesterday and presented to the ED. - Labs notable for potassium 3.9, mag 1.5, creatinine 2.1, hemoglobin 8.9, troponin 14, chest x-ray without acute findings, D-dimer was elevated at 8.1 - Admitted, seen by EP, amiodarone  dose increased, magnesium  repleted - VQ scan noted intermediate probability> CTA chest-right-sided PE, improvement in hydropneumothorax of left, started on IV heparin  5/22 - 5/25, having abdominal distention, nausea and vomiting - 5/26: Still with nausea and vomiting, starting to improve - 5/27: GI symptoms improved,,  - 5/28, after discussion with EP decided to keep as inpatient to avoid long interruptions in anticoagulation   Subjective: - Feels better, no further nausea or vomiting, tolerated diet yesterday, no BMs, feels a little constipated  Assessment and Plan:  Recurrent VT/VF, sp ICD shock - EP following, amiodarone  dose increased to 200 Mg twice daily - Replace magnesium  and K today  Acute right-sided PE - Chest x-ray unremarkable, VQ scan indeterminate - CTA positive for PE, decrease in size of hydropneumothorax, left pleural effusion moderate - Remains on IV heparin , plan  was for transition to oral Eliquis however she is due for generator change on Friday, with logistics and difficulty with managing subcu Lovenox  at home and insistence on generator change this week will  keep her inpatient till Friday - Discussed with EP, will restart anticoagulation 3 to 4 days after generator change with 5 Mg twice daily of Eliquis, EP will clarify after procedure tomorrow  Constipation, abdominal distention, nausea and vomiting - KUB reassuring, continue lactulose, decrease frequency - Improving, advance diet  Acute on chronic systolic CHF - Last echo 4/27 with a EF 40-45%, grade 1 DD, normal RV - Continue oral torsemide   CAD - Stable, no ACS, continue aspirin  and statin  Acute on chronic anemia Hemoglobin lower than baseline, iron stores are adequate, likely hemodilution contributing as well -Also has CML - Improving, monitor  H/o CML - Followed by oncology at [facility], Gleevac(imatinib  on hold), follow-up with oncology, plan to change to Asciminib at FU, they are ok with DoAc at [facility]  AKi/CKD3a - Improving, monitor  DVT prophylaxis: lovenox  Code Status: Full Family Communication: None present Disposition Plan: Home likely Friday after gen change  Consultants: EP   Procedures:   Antimicrobials:    Objective: Vitals:   01/05/24 2305 01/05/24 2310 01/06/24 0335 01/06/24 0338  BP: (!) 147/64 (!) 148/62 (!) 146/52   Pulse:   66   Resp:   18   Temp:  98.8 F (37.1 C) 97.8 F (36.6 C)   TempSrc:  Oral Oral   SpO2:  99% 98%   Weight:    67.2 kg  Height:        Intake/Output Summary (Last 24 hours) at 01/06/2024 1020 Last data filed at 01/06/2024 0900 Gross  per 24 hour  Intake 669.86 ml  Output 400 ml  Net 269.86 ml   Filed Weights   01/03/24 0416 01/04/24 0400 01/06/24 0338  Weight: 65.8 kg 66.8 kg 67.2 kg    Examination:  Gen: Awake, Alert, Oriented X 3,  HEENT: no JVD Lungs: Good air movement bilaterally, CTAB CVS: S1S2/RRR Abd:  soft, Non tender, mildly distended, BS present Extremities: trace edema Skin: Chronically dry skin with scaling    Data Reviewed:   CBC: Recent Labs  Lab 01/01/24 0322 01/02/24 0323 01/03/24 0313 01/04/24 0308 01/06/24 0247  WBC 7.5 11.1* 11.9* 8.3 8.0  HGB 8.5* 10.1* 8.7* 7.7* 8.3*  HCT 26.4* 31.1* 26.5* 23.9* 25.0*  MCV 97.8 96.6 96.7 96.4 96.2  PLT 239 351 313 261 284   Basic Metabolic Panel: Recent Labs  Lab 01/02/24 0323 01/03/24 0313 01/04/24 0308 01/05/24 0226 01/06/24 0247  NA 137 135 134* 134* 137  K 3.5 4.0 4.0 3.8 3.3*  CL 100 101 102 99 100  CO2 25 26 27 27 28   GLUCOSE 138* 119* 90 105* 118*  BUN 18 23 25* 23 21  CREATININE 1.37* 1.47* 1.56* 1.54* 1.54*  CALCIUM  8.8* 8.4* 8.0* 8.0* 8.3*  MG 2.7* 2.8* 2.5* 2.0 1.7   GFR: Estimated Creatinine Clearance: 25.1 mL/min (A) (by C-G formula based on SCr of 1.54 mg/dL (H)). Liver Function Tests: No results for input(s): "AST", "ALT", "ALKPHOS", "BILITOT", "PROT", "ALBUMIN " in the last 168 hours. No results for input(s): "LIPASE", "AMYLASE" in the last 168 hours. No results for input(s): "AMMONIA" in the last 168 hours. Coagulation Profile: No results for input(s): "INR", "PROTIME" in the last 168 hours. Cardiac Enzymes: No results for input(s): "CKTOTAL", "CKMB", "CKMBINDEX", "TROPONINI" in the last 168 hours. BNP (last 3 results) Recent Labs    12/04/23 1144  PROBNP 4,071.0*   HbA1C: No results for input(s): "HGBA1C" in the last 72 hours. CBG: Recent Labs  Lab 12/31/23 1723  GLUCAP 113*   Lipid Profile: No results for input(s): "CHOL", "HDL", "LDLCALC", "TRIG", "CHOLHDL", "LDLDIRECT" in the last 72 hours. Thyroid  Function Tests: No results for input(s): "TSH", "T4TOTAL", "FREET4", "T3FREE", "THYROIDAB" in the last 72 hours. Anemia Panel: Recent Labs    01/04/24 0855  VITAMINB12 530  FOLATE 6.1  FERRITIN 444*  TIBC 199*  IRON 133  RETICCTPCT 2.6   Urine analysis:    Component Value  Date/Time   COLORURINE AMBER (A) 01/02/2024 0725   APPEARANCEUR HAZY (A) 01/02/2024 0725   LABSPEC 1.019 01/02/2024 0725   PHURINE 5.0 01/02/2024 0725   GLUCOSEU NEGATIVE 01/02/2024 0725   HGBUR NEGATIVE 01/02/2024 0725   BILIRUBINUR NEGATIVE 01/02/2024 0725   BILIRUBINUR negative 06/04/2016 0847   BILIRUBINUR neg 02/14/2015 1431   KETONESUR NEGATIVE 01/02/2024 0725   PROTEINUR NEGATIVE 01/02/2024 0725   UROBILINOGEN 0.2 06/04/2016 0847   NITRITE NEGATIVE 01/02/2024 0725   LEUKOCYTESUR NEGATIVE 01/02/2024 0725   Sepsis Labs: @LABRCNTIP (procalcitonin:4,lacticidven:4)  )No results found for this or any previous visit (from the past 240 hours).   Radiology Studies: No results found.    Scheduled Meds:  amiodarone   200 mg Oral BID   aspirin  EC  81 mg Oral q AM   atorvastatin   20 mg Oral Daily   lidocaine   1 patch Transdermal Q24H   magnesium  oxide  800 mg Oral Daily   potassium chloride   40 mEq Oral Q4H   thyroid   15 mg Oral QAC breakfast   timolol   1 drop Both Eyes BID  torsemide   20 mg Oral Daily   Continuous Infusions:  heparin  950 Units/hr (01/05/24 2309)   promethazine  (PHENERGAN ) injection (IM or IVPB) Stopped (01/01/24 2306)     LOS: 7 days    Time spent:    Deforest Fast, MD [facility]   01/06/2024, 10:20 AM

## 2024-01-06 NOTE — Progress Notes (Signed)
 Bilateral lower extremity venous duplex has been completed. Preliminary results can be found in CV Proc through chart review.   01/06/24 12:23 PM Birda Buffy RVT

## 2024-01-06 NOTE — *Deleted (Incomplete)
 Rounding Note   Patient Name: Lisa [name] Date of Encounter: 01/06/2024  [facility] Cardiologist: Ola Berger, MD ***  Subjective ***  Scheduled Meds:  amiodarone   200 mg Oral BID   aspirin  EC  81 mg Oral q AM   atorvastatin   20 mg Oral Daily   lidocaine   1 patch Transdermal Q24H   magnesium  oxide  800 mg Oral Daily   potassium chloride   40 mEq Oral Q4H   thyroid   15 mg Oral QAC breakfast   timolol   1 drop Both Eyes BID   torsemide   20 mg Oral Daily   Continuous Infusions:  heparin  950 Units/hr (01/05/24 2309)   promethazine  (PHENERGAN ) injection (IM or IVPB) Stopped (01/01/24 2306)   PRN Meds: acetaminophen , diphenhydrAMINE , ipratropium-albuterol , lactulose, promethazine  (PHENERGAN ) injection (IM or IVPB)   Vital Signs  Vitals:   01/05/24 2305 01/05/24 2310 01/06/24 0335 01/06/24 0338  BP: (!) 147/64 (!) 148/62 (!) 146/52   Pulse:   66   Resp:   18   Temp:  98.8 F (37.1 C) 97.8 F (36.6 C)   TempSrc:  Oral Oral   SpO2:  99% 98%   Weight:    67.2 kg  Height:        Intake/Output Summary (Last 24 hours) at 01/06/2024 1517 Last data filed at 01/06/2024 0900 Gross per 24 hour  Intake 669.86 ml  Output 400 ml  Net 269.86 ml      01/06/2024    3:38 AM 01/04/2024    4:00 AM 01/03/2024    4:16 AM  Last 3 Weights  Weight (lbs) 148 lb 2.4 oz 147 lb 3.2 oz 145 lb  Weight (kg) 67.2 kg 66.769 kg 65.772 kg      Telemetry *** - Personally Reviewed  ECG  *** - Personally Reviewed  Physical Exam *** GEN: No acute distress.   Neck: No JVD Cardiac: RRR, no murmurs, rubs, or gallops.  Respiratory: Clear to auscultation bilaterally. GI: Soft, nontender, non-distended  MS: No edema; No deformity. Neuro:  Nonfocal  Psych: Normal affect   Labs High Sensitivity Troponin:   Recent Labs  Lab 12/28/23 2336 12/29/23 0127  TROPONINIHS 15 14     Chemistry Recent Labs  Lab 01/04/24 0308 01/05/24 0226 01/06/24 0247  NA 134* 134* 137  K 4.0  3.8 3.3*  CL 102 99 100  CO2 27 27 28   GLUCOSE 90 105* 118*  BUN 25* 23 21  CREATININE 1.56* 1.54* 1.54*  CALCIUM  8.0* 8.0* 8.3*  MG 2.5* 2.0 1.7  GFRNONAA 33* 34* 34*  ANIONGAP 5 8 9     Lipids No results for input(s): "CHOL", "TRIG", "HDL", "LABVLDL", "LDLCALC", "CHOLHDL" in the last 168 hours.  Hematology Recent Labs  Lab 01/03/24 0313 01/04/24 0308 01/04/24 0855 01/06/24 0247  WBC 11.9* 8.3  --  8.0  RBC 2.74* 2.48* 2.72* 2.60*  HGB 8.7* 7.7*  --  8.3*  HCT 26.5* 23.9*  --  25.0*  MCV 96.7 96.4  --  96.2  MCH 31.8 31.0  --  31.9  MCHC 32.8 32.2  --  33.2  RDW 15.4 15.3  --  15.2  PLT 313 261  --  284   Thyroid  No results for input(s): "TSH", "FREET4" in the last 168 hours.  BNPNo results for input(s): "BNP", "PROBNP" in the last 168 hours.  DDimer No results for input(s): "DDIMER" in the last 168 hours.   Radiology  VAS US  LOWER EXTREMITY VENOUS (DVT) Result Date:  01/06/2024  Lower Venous DVT Study Patient Name:  Lisa [name]  Date of Exam:   01/06/2024 Medical Rec #: [id number]           Accession #:    [id number] Date of Birth: 1940/12/08            Patient Gender: F Patient Age:   83 years Exam Location:  [facility] Procedure:      VAS US  LOWER EXTREMITY VENOUS (DVT) Referring Phys: Deforest Fast --------------------------------------------------------------------------------  Indications: Edema, and pulmonary embolism.  Risk Factors: Confirmed PE. Anticoagulation: Heparin . Limitations: Poor ultrasound/tissue interface. Comparison Study: No prior studies. Performing Technologist: Lerry Ransom RVT  Examination Guidelines: A complete evaluation includes B-mode imaging, spectral Doppler, color Doppler, and power Doppler as needed of all accessible portions of each vessel. Bilateral testing is considered an integral part of a complete examination. Limited examinations for reoccurring indications may be performed as noted. The reflux portion of the exam is performed  with the patient in reverse Trendelenburg.  +---------+---------------+---------+-----------+----------+--------------+ RIGHT    CompressibilityPhasicitySpontaneityPropertiesThrombus Aging +---------+---------------+---------+-----------+----------+--------------+ CFV      Full           Yes      Yes                                 +---------+---------------+---------+-----------+----------+--------------+ SFJ      Full                                                        +---------+---------------+---------+-----------+----------+--------------+ FV Prox  Full                                                        +---------+---------------+---------+-----------+----------+--------------+ FV Mid   Full                                                        +---------+---------------+---------+-----------+----------+--------------+ FV DistalFull                                                        +---------+---------------+---------+-----------+----------+--------------+ PFV      Full                                                        +---------+---------------+---------+-----------+----------+--------------+ POP      Full           Yes      Yes                                 +---------+---------------+---------+-----------+----------+--------------+  PTV      Full                                                        +---------+---------------+---------+-----------+----------+--------------+ PERO     Full                                                        +---------+---------------+---------+-----------+----------+--------------+   +---------+---------------+---------+-----------+----------+--------------+ LEFT     CompressibilityPhasicitySpontaneityPropertiesThrombus Aging +---------+---------------+---------+-----------+----------+--------------+ CFV      Full           Yes      Yes                                  +---------+---------------+---------+-----------+----------+--------------+ SFJ      Full                                                        +---------+---------------+---------+-----------+----------+--------------+ FV Prox  Full                                                        +---------+---------------+---------+-----------+----------+--------------+ FV Mid   Full                                                        +---------+---------------+---------+-----------+----------+--------------+ FV DistalFull                                                        +---------+---------------+---------+-----------+----------+--------------+ PFV      Full                                                        +---------+---------------+---------+-----------+----------+--------------+ POP      Full           Yes      Yes                                 +---------+---------------+---------+-----------+----------+--------------+ PTV      Full                                                        +---------+---------------+---------+-----------+----------+--------------+  PERO     Full                                                        +---------+---------------+---------+-----------+----------+--------------+    Summary: RIGHT: - There is no evidence of deep vein thrombosis in the lower extremity.  - No cystic structure found in the popliteal fossa.  LEFT: - There is no evidence of deep vein thrombosis in the lower extremity.  - No cystic structure found in the popliteal fossa.  *See table(s) above for measurements and observations.    Preliminary     Cardiac Studies ***  Patient Profile   83 y.o. female ***  Assessment & Plan  *** {Are we signing off today?:[id number]} For questions or updates, please contact [facility] Please consult [email or web address] for contact info under     Signed, Tylene Galla, PA-C  01/06/2024,  3:17 PM

## 2024-01-07 ENCOUNTER — Ambulatory Visit (HOSPITAL_COMMUNITY): Admission: RE | Admit: 2024-01-07 | Source: Home / Self Care | Admitting: Internal Medicine

## 2024-01-07 ENCOUNTER — Encounter (HOSPITAL_COMMUNITY)
Admission: EM | Disposition: A | Discharge status: Home Health | Payer: Self-pay | Source: Home / Self Care | Attending: Internal Medicine

## 2024-01-07 DIAGNOSIS — Z4502 Encounter for adjustment and management of automatic implantable cardiac defibrillator: Secondary | ICD-10-CM | POA: Diagnosis not present

## 2024-01-07 DIAGNOSIS — I4901 Ventricular fibrillation: Secondary | ICD-10-CM | POA: Diagnosis not present

## 2024-01-07 HISTORY — PX: ICD GENERATOR CHANGEOUT: EP1231

## 2024-01-07 LAB — CBC
HCT: 25.6 % — ABNORMAL LOW (ref 36.0–46.0)
Hemoglobin: 8.5 g/dL — ABNORMAL LOW (ref 12.0–15.0)
MCH: 32 pg (ref 26.0–34.0)
MCHC: 33.2 g/dL (ref 30.0–36.0)
MCV: 96.2 fL (ref 80.0–100.0)
Platelets: 297 10*3/uL (ref 150–400)
RBC: 2.66 MIL/uL — ABNORMAL LOW (ref 3.87–5.11)
RDW: 15.4 % (ref 11.5–15.5)
WBC: 8 10*3/uL (ref 4.0–10.5)
nRBC: 0 % (ref 0.0–0.2)

## 2024-01-07 LAB — BASIC METABOLIC PANEL WITH GFR
Anion gap: 7 (ref 5–15)
BUN: 19 mg/dL (ref 8–23)
CO2: 28 mmol/L (ref 22–32)
Calcium: 8.1 mg/dL — ABNORMAL LOW (ref 8.9–10.3)
Chloride: 99 mmol/L (ref 98–111)
Creatinine, Ser: 1.43 mg/dL — ABNORMAL HIGH (ref 0.44–1.00)
GFR, Estimated: 37 mL/min — ABNORMAL LOW (ref >60)
Glucose, Bld: 120 mg/dL — ABNORMAL HIGH (ref 70–99)
Potassium: 4.4 mmol/L (ref 3.5–5.1)
Sodium: 134 mmol/L — ABNORMAL LOW (ref 135–145)

## 2024-01-07 LAB — MAGNESIUM: Magnesium: 2.3 mg/dL (ref 1.7–2.4)

## 2024-01-07 SURGERY — ICD GENERATOR CHANGEOUT

## 2024-01-07 MED ORDER — MIDAZOLAM HCL 2 MG/2ML IJ SOLN
INTRAMUSCULAR | Status: AC
  Filled 2024-01-07: qty 2

## 2024-01-07 MED ORDER — LIDOCAINE HCL (PF) 1 % IJ SOLN
INTRAMUSCULAR | Status: AC
  Filled 2024-01-07: qty 60

## 2024-01-07 MED ORDER — FENTANYL CITRATE (PF) 100 MCG/2ML IJ SOLN
INTRAMUSCULAR | Status: AC
  Filled 2024-01-07: qty 2

## 2024-01-07 MED ORDER — SODIUM CHLORIDE 0.9 % IV SOLN
INTRAVENOUS | Status: AC
  Filled 2024-01-07: qty 2

## 2024-01-07 MED ORDER — MIDAZOLAM HCL 5 MG/5ML IJ SOLN
INTRAMUSCULAR | Status: DC | PRN
  Administered 2024-01-07 (×2): 1 mg via INTRAVENOUS

## 2024-01-07 MED ORDER — CEFAZOLIN SODIUM-DEXTROSE 2-4 GM/100ML-% IV SOLN
INTRAVENOUS | Status: AC
  Filled 2024-01-07: qty 100

## 2024-01-07 MED ORDER — LIDOCAINE HCL (PF) 1 % IJ SOLN
INTRAMUSCULAR | Status: DC | PRN
Start: 2024-01-07 — End: 2024-01-07
  Administered 2024-01-07: 60 mL via INTRADERMAL

## 2024-01-07 MED ORDER — FENTANYL CITRATE (PF) 100 MCG/2ML IJ SOLN
INTRAMUSCULAR | Status: DC | PRN
  Administered 2024-01-07 (×2): 12.5 ug via INTRAVENOUS

## 2024-01-07 SURGICAL SUPPLY — 7 items
CABLE SURGICAL S-101-97-12 (CABLE) ×2 IMPLANT
ICD COBALT CRTD DTPB2QQ (ICD Generator) IMPLANT
PAD DEFIB RADIO PHYSIO CONN (PAD) ×2 IMPLANT
POUCH AIGIS-R ANTIBACT ICD (Mesh General) ×1 IMPLANT
POUCH AIGIS-R ANTIBACT ICD LRG (Mesh General) IMPLANT
SYR CONTROL 10ML ANGIOGRAPHIC (SYRINGE) IMPLANT
TRAY PACEMAKER INSERTION (PACKS) ×2 IMPLANT

## 2024-01-07 NOTE — Progress Notes (Addendum)
  Patient Name: Lisa [name] Date of Encounter: 01/07/2024  Primary Cardiologist: Ola Berger, MD Electrophysiologist: Manya Sells, MD  Interval Summary   The patient is doing well today.  Anxious to have her procedure.   At this time, the patient denies chest pain, shortness of breath, or any new concerns.  Vital Signs    Vitals:   01/06/24 1710 01/06/24 2044 01/07/24 0045 01/07/24 0437  BP: (!) 142/63 (!) 125/50 (!) 140/65 (!) 126/59  Pulse: 65 66 66 63  Resp: 18 18 18 18   Temp: 98.6 F (37 C) 98.6 F (37 C) 98.1 F (36.7 C) 98.5 F (36.9 C)  TempSrc: Oral Oral Oral Oral  SpO2: 98% 97% 98% 96%  Weight:    68.1 kg  Height:        Intake/Output Summary (Last 24 hours) at 01/07/2024 0821 Last data filed at 01/07/2024 0046 Gross per 24 hour  Intake 400.28 ml  Output 1500 ml  Net -1099.72 ml   Filed Weights   01/04/24 0400 01/06/24 0338 01/07/24 0437  Weight: 66.8 kg 67.2 kg 68.1 kg    Physical Exam    GEN- pleasant, chronically ill appearing female, alert and oriented x 3 today.   Lungs- Clear to ausculation bilaterally, normal work of breathing Cardiac- Regular rate and rhythm, no murmurs, rubs or gallops GI- soft, NT, ND, + BS Extremities- no clubbing or cyanosis. No edema  Telemetry    VP 60-80's (personally reviewed)  Hospital Course    JANIQUE HOEFER is a 83 y.o. female with PMH of HFrEF, VT/VF arrest s/p ICD, CML, HTN, obesity, hypothyroidism, anemia admitted 12/29/23 for right sided chest pain.  She had a recent admit for UTI with ICD shock x3 during that admit. She had recurrent pleural effusions during that admit and underwent thoracentesis with residual hydropneumothorax s/p chest tube. She came back with right chest pain. CTA chest showed right sided PE. She was started on IV heparin  (stopped for procedure). LE duplex neg for DVT.     Assessment & Plan    ICD in Situ  At [facility] -NPO for generator change  -procedure reviewed with patient    Segmental / Sub-Segmental PE  No RV strain, hx CLL. LE venous duplex negative.  -per [facility]  -will confirm timing of start for DOAC post generator change   Recurrent VT  -amiodarone  200 mg daily  -goal K+>4, Mg+>2   CLL  -per TR[facility]   HFrEF  -per TRH      For questions or updates, please contact [facility] Please consult [email or web address] for contact info under     Signed, Creighton Doffing, NP-C, AGACNP-BC [facility] - Electrophysiology  01/07/2024, 8:25 AM  EP Attending  Patient seen and examined. Agree with the findings as noted above. On exam CV with a RRR and lungs are clear and ext are warm and tele demonstrates biv pacing. She is at ERI/EOL on her device. The patient is doing well today and will undergo biv ICD gen change out. The indications/risks/benefits/goals/expectations were reviewed and she wishes to proceed.  Pete Brand Jaydynn Wolford,MD

## 2024-01-07 NOTE — Progress Notes (Signed)
 PT Cancellation Note  Patient Details Name: RAENELL MENSING MRN: [id number] DOB: [birth year]   Cancelled Treatment:    Reason Eval/Treat Not Completed: Patient at procedure or test/unavailable (Pt off floor for ICD generator change out. Will follow-up for PT treatment as schedule permits.)  Glenford Lanes, PT, DPT Acute Rehabilitation Services Office: [phone] Secure Chat Preferred  Riva Chester 01/07/2024, 11:33 AM

## 2024-01-07 NOTE — Discharge Instructions (Addendum)

## 2024-01-07 NOTE — Plan of Care (Signed)

## 2024-01-07 NOTE — Progress Notes (Signed)
 PROGRESS NOTE    Lisa [name]  ZOX:[id number] DOB: 1941-03-22 DOA: 12/28/2023 PCP: Faustina Hood, MD  83/F with chronic systolic CHF, hypertension, hypothyroidism, chronic anemia, history of VT/VF arrest, CML, obesity, pleural effusion, lymphedema presented to the ED 5/21 following an ICD shock on 5/16. - Recently hospitalized in 4/25 with UTI had VT with ICD shock X3 in the ED, noted to be volume overloaded with recurrent pleural effusions at the time, underwent thoracentesis which was then complicated by hydropneumothorax required chest tube, this was eventually removed and discharged back home on 5/8. - She received a device alert for ICD shock that occurred on 5/18, patient was sleeping and unaware, reported having dizziness lightheadedness numbness and some right sided chest pain yesterday and presented to the ED. - Labs notable for potassium 3.9, mag 1.5, creatinine 2.1, hemoglobin 8.9, troponin 14, chest x-ray without acute findings, D-dimer was elevated at 8.1 - Admitted, seen by EP, amiodarone  dose increased, magnesium  repleted - VQ scan noted intermediate probability> CTA chest-right-sided PE, improvement in hydropneumothorax of left, started on IV heparin  5/22 - 5/25, having abdominal distention, nausea and vomiting - 5/26: Still with nausea and vomiting, starting to improve - 5/27: GI symptoms improved,,  - 5/28, after discussion with EP decided to keep as inpatient to avoid long interruptions in anticoagulation -5/29 Underwent generator replacement   Subjective: Denies N/V. She is tolerating a diet. She had a BM this AM.   Assessment and Plan:  Recurrent VT/VF, sp ICD shock - EP following, amiodarone  continued at  200 Mg twice daily -ICD generator being replaced this PM   Acute right-sided PE - Chest x-ray unremarkable, VQ scan indeterminate -Pt's CML, HF, age all risk factors for VTE  - CTA chest positive for PE, decrease in size of hydropneumothorax, left pleural  effusion moderate - Remains on IV heparin , plan was for transition to oral Eliquis however she is due for generator change on Friday, with logistics and difficulty with managing subcu Lovenox  at home and insistence on generator change this week will  keep her inpatient till Friday - EP will help decide when to resume [facility]  Constipation, abdominal distention, nausea and vomiting - Resolved, continue lactulose   Acute on chronic systolic CHF - Last echo 4/27 with a EF 40-45%, grade 1 DD, normal RV - Appears euvolemic  - Continue oral torsemide   CAD - S/p stent 10/2015. Stable, no ACS, continue aspirin  and statin  Acute on chronic anemia Hemoglobin lower than baseline, iron stores are adequate, likely hemodilution contributing as well -Also has CML - Improving, monitor  . H/o CML - Followed by oncology at [facility], Gleevac(imatinib  on hold), follow-up with oncology, plan to change to Asciminib at FU, they are ok with DoAc at DC  AKi/CKD3a    Latest Ref Rng & Units 01/07/2024    2:24 AM 01/06/2024    2:47 AM 01/05/2024    2:26 AM  BMP  Glucose 70 - 99 mg/dL 811  914  782   BUN 8 - 23 mg/dL 19  21  23    Creatinine 0.44 - 1.00 mg/dL 9.56  2.13  0.86   Sodium 135 - 145 mmol/L 134  137  134   Potassium 3.5 - 5.1 mmol/L 4.4  3.3  3.8   Chloride 98 - 111 mmol/L 99  100  99   CO2 22 - 32 mmol/L 28  28  27    Calcium  8.9 - 10.3 mg/dL 8.1  8.3  8.0  Appears to be near baseline.   DVT prophylaxis: lovenox  Code Status: Full Family Communication: None present Disposition Plan: Home likely Friday after gen change  Consultants: EP  Procedures: ICD generator replaced   Antimicrobials:    Objective: Vitals:   01/07/24 1201 01/07/24 1206 01/07/24 1211 01/07/24 1216  BP: (!) 141/77 124/62 (!) 145/64 (!) 146/62  Pulse: 80 78 66 63  Resp: (!) 23 (!) 21 (!) 26 (!) 22  Temp:      TempSrc:      SpO2: 97% 90% 99% 99%  Weight:      Height:        Intake/Output Summary (Last 24  hours) at 01/07/2024 1441 Last data filed at 01/07/2024 0700 Gross per 24 hour  Intake 280.28 ml  Output 1700 ml  Net -1419.72 ml   Filed Weights   01/04/24 0400 01/06/24 0338 01/07/24 0437  Weight: 66.8 kg 67.2 kg 68.1 kg     Physical Exam  Constitutional: In no distress.  Neck: no elevated JVP  Cardiovascular: Normal rate, regular rhythm. Trace non pitting lower extremity edema  Pulmonary: Non labored breathing on room air, no wheezing or rales.  Abdominal: Soft. Normal bowel sounds. Non distended and non tender Musculoskeletal: Normal range of motion.     Neurological: Alert and oriented to person, place, and time. Non focal  Skin: Skin is warm and dry.   Data Reviewed:   CBC: Recent Labs  Lab 01/02/24 0323 01/03/24 0313 01/04/24 0308 01/06/24 0247 01/07/24 0224  WBC 11.1* 11.9* 8.3 8.0 8.0  HGB 10.1* 8.7* 7.7* 8.3* 8.5*  HCT 31.1* 26.5* 23.9* 25.0* 25.6*  MCV 96.6 96.7 96.4 96.2 96.2  PLT 351 313 261 284 297   Basic Metabolic Panel: Recent Labs  Lab 01/03/24 0313 01/04/24 0308 01/05/24 0226 01/06/24 0247 01/07/24 0224  NA 135 134* 134* 137 134*  K 4.0 4.0 3.8 3.3* 4.4  CL 101 102 99 100 99  CO2 26 27 27 28 28   GLUCOSE 119* 90 105* 118* 120*  BUN 23 25* 23 21 19   CREATININE 1.47* 1.56* 1.54* 1.54* 1.43*  CALCIUM  8.4* 8.0* 8.0* 8.3* 8.1*  MG 2.8* 2.5* 2.0 1.7 2.3   GFR: Estimated Creatinine Clearance: 27.2 mL/min (A) (by C-G formula based on SCr of 1.43 mg/dL (H)). Liver Function Tests: No results for input(s): "AST", "ALT", "ALKPHOS", "BILITOT", "PROT", "ALBUMIN " in the last 168 hours. No results for input(s): "LIPASE", "AMYLASE" in the last 168 hours. No results for input(s): "AMMONIA" in the last 168 hours. Coagulation Profile: No results for input(s): "INR", "PROTIME" in the last 168 hours. Cardiac Enzymes: No results for input(s): "CKTOTAL", "CKMB", "CKMBINDEX", "TROPONINI" in the last 168 hours. BNP (last 3 results) Recent Labs     12/04/23 1144  PROBNP 4,071.0*   HbA1C: No results for input(s): "HGBA1C" in the last 72 hours. CBG: Recent Labs  Lab 12/31/23 1723  GLUCAP 113*   Lipid Profile: No results for input(s): "CHOL", "HDL", "LDLCALC", "TRIG", "CHOLHDL", "LDLDIRECT" in the last 72 hours. Thyroid  Function Tests: No results for input(s): "TSH", "T4TOTAL", "FREET4", "T3FREE", "THYROIDAB" in the last 72 hours. Anemia Panel: No results for input(s): "VITAMINB12", "FOLATE", "FERRITIN", "TIBC", "IRON", "RETICCTPCT" in the last 72 hours.  Urine analysis:    Component Value Date/Time   COLORURINE AMBER (A) 01/02/2024 0725   APPEARANCEUR HAZY (A) 01/02/2024 0725   LABSPEC 1.019 01/02/2024 0725   PHURINE 5.0 01/02/2024 0725   GLUCOSEU NEGATIVE 01/02/2024 0725   HGBUR NEGATIVE 01/02/2024 0725  BILIRUBINUR NEGATIVE 01/02/2024 0725   BILIRUBINUR negative 06/04/2016 0847   BILIRUBINUR neg 02/14/2015 1431   KETONESUR NEGATIVE 01/02/2024 0725   PROTEINUR NEGATIVE 01/02/2024 0725   UROBILINOGEN 0.2 06/04/2016 0847   NITRITE NEGATIVE 01/02/2024 0725   LEUKOCYTESUR NEGATIVE 01/02/2024 0725   Sepsis Labs: @LABRCNTIP (procalcitonin:4,lacticidven:4)  )No results found for this or any previous visit (from the past 240 hours).   Radiology Studies: VAS US  LOWER EXTREMITY VENOUS (DVT) Result Date: 01/06/2024  Lower Venous DVT Study Patient Name:  ANIVEA VELASQUES  Date of Exam:   01/06/2024 Medical Rec #: [id number]           Accession #:    [id number] Date of Birth: 1941/07/30            Patient Gender: F Patient Age:   87 years Exam Location:  [facility] Procedure:      VAS US  LOWER EXTREMITY VENOUS (DVT) Referring Phys: Deforest Fast --------------------------------------------------------------------------------  Indications: Edema, and pulmonary embolism.  Risk Factors: Confirmed PE. Anticoagulation: Heparin . Limitations: Poor ultrasound/tissue interface. Comparison Study: No prior studies. Performing  Technologist: Lerry Ransom RVT  Examination Guidelines: A complete evaluation includes B-mode imaging, spectral Doppler, color Doppler, and power Doppler as needed of all accessible portions of each vessel. Bilateral testing is considered an integral part of a complete examination. Limited examinations for reoccurring indications may be performed as noted. The reflux portion of the exam is performed with the patient in reverse Trendelenburg.  +---------+---------------+---------+-----------+----------+--------------+ RIGHT    CompressibilityPhasicitySpontaneityPropertiesThrombus Aging +---------+---------------+---------+-----------+----------+--------------+ CFV      Full           Yes      Yes                                 +---------+---------------+---------+-----------+----------+--------------+ SFJ      Full                                                        +---------+---------------+---------+-----------+----------+--------------+ FV Prox  Full                                                        +---------+---------------+---------+-----------+----------+--------------+ FV Mid   Full                                                        +---------+---------------+---------+-----------+----------+--------------+ FV DistalFull                                                        +---------+---------------+---------+-----------+----------+--------------+ PFV      Full                                                        +---------+---------------+---------+-----------+----------+--------------+  POP      Full           Yes      Yes                                 +---------+---------------+---------+-----------+----------+--------------+ PTV      Full                                                        +---------+---------------+---------+-----------+----------+--------------+ PERO     Full                                                         +---------+---------------+---------+-----------+----------+--------------+   +---------+---------------+---------+-----------+----------+--------------+ LEFT     CompressibilityPhasicitySpontaneityPropertiesThrombus Aging +---------+---------------+---------+-----------+----------+--------------+ CFV      Full           Yes      Yes                                 +---------+---------------+---------+-----------+----------+--------------+ SFJ      Full                                                        +---------+---------------+---------+-----------+----------+--------------+ FV Prox  Full                                                        +---------+---------------+---------+-----------+----------+--------------+ FV Mid   Full                                                        +---------+---------------+---------+-----------+----------+--------------+ FV DistalFull                                                        +---------+---------------+---------+-----------+----------+--------------+ PFV      Full                                                        +---------+---------------+---------+-----------+----------+--------------+ POP      Full           Yes      Yes                                 +---------+---------------+---------+-----------+----------+--------------+  PTV      Full                                                        +---------+---------------+---------+-----------+----------+--------------+ PERO     Full                                                        +---------+---------------+---------+-----------+----------+--------------+     Summary: RIGHT: - There is no evidence of deep vein thrombosis in the lower extremity.  - No cystic structure found in the popliteal fossa.  LEFT: - There is no evidence of deep vein thrombosis in the lower extremity.  - No cystic structure found in  the popliteal fossa.  *See table(s) above for measurements and observations. Electronically signed by Runell Countryman on 01/06/2024 at 6:20:21 PM.    Final       Scheduled Meds:  amiodarone   200 mg Oral BID   aspirin  EC  81 mg Oral q AM   atorvastatin   20 mg Oral Daily   lidocaine   1 patch Transdermal Q24H   magnesium  oxide  800 mg Oral Daily   sodium chloride  0.9 % with gentamicin  (GARAMYCIN ) ADS Med       thyroid   15 mg Oral QAC breakfast   timolol   1 drop Both Eyes BID   torsemide   20 mg Oral Daily   Continuous Infusions:  ceFAZolin      promethazine  (PHENERGAN ) injection (IM or IVPB) Stopped (01/01/24 2306)     LOS: 8 days    Time spent:    Joette Mustard MD [facility]   01/07/2024, 2:41 PM

## 2024-01-07 NOTE — Progress Notes (Signed)
 Mobility Specialist: Progress Note   01/07/24 1100  Mobility  Activity Ambulated with assistance in hallway  Level of Assistance Standby assist, set-up cues, supervision of patient - no hands on  Assistive Device Four wheel walker  Distance Ambulated (ft) 300 ft  Activity Response Tolerated well  Mobility Referral Yes  Mobility visit 1 Mobility  Mobility Specialist Start Time (ACUTE ONLY) 0919  Mobility Specialist Stop Time (ACUTE ONLY) 0940  Mobility Specialist Time Calculation (min) (ACUTE ONLY) 21 min    Received pt in bed having no complaints and agreeable to mobility. Pt was asymptomatic throughout ambulation and returned to room w/o fault. Left in bed w/ call bell in reach and all needs met.   Deloria Fetch Mobility Specialist Please contact via SecureChat or Rehab office at [phone]

## 2024-01-07 NOTE — Progress Notes (Signed)
 Physical Therapy Treatment Patient Details Name: Lisa [name] MRN: [id number] DOB: 1941/04/14 Today's Date: 01/07/2024   History of Present Illness Patient is an 83 y/o female admitted 12/28/23 due to alert that had ICD shock on 5/18 and was encouraged to come to ED if any symptoms which she had R chest pain, light headedness and found to have acute R side PE and acute on chronic CHF. Pt underwent generator replacement 5/30. PMH positive for CHF, HTN, hypothyroidism, chronic anemia, h/o VT/VF arrest, CML, obesity, pleural effusion, lymphedema.  Recent stay for pleural effusion s/p thoracentesis complicated by  hydropneumothorax and chest tube and found to have UTI.    PT Comments  Pt greeted supine in bed, pleasant and agreeable to PT treatment. Today's session focused on stair training. Educated pt on how to ascend/descend steps according to her home set-up and how her family could be positioned in order to assist her with this task. Pt ambulated to/from the stairwell using rollator with supervision. She took a seated rest before and after the stairs to reset as she became very nervous and was fearful of falling. Pt completed 3 steps with a step-to pattern and BUE support provided by handrail and HHA. She reported improved confidence with stairs. Will continue to follow acutely and advance appropriately.     If plan is discharge home, recommend the following: Assist for transportation;Assistance with cooking/housework;Help with stairs or ramp for entrance   Can travel by private vehicle        Equipment Recommendations  None recommended by PT    Recommendations for Other Services       Precautions / Restrictions Precautions Precautions: Fall Recall of Precautions/Restrictions: Intact Restrictions Weight Bearing Restrictions Per Provider Order: No     Mobility  Bed Mobility Overal bed mobility: Needs Assistance Bed Mobility: Sit to Supine, Supine to Sit     Supine to sit:  Supervision Sit to supine: Contact guard assist   General bed mobility comments: Pt sat up on R side of bed with HOB elevated. She required assist to bring RLE back into bed and to aid in repositioning her in the center of bed with pillow under thighs.    Transfers Overall transfer level: Needs assistance Equipment used: Rollator (4 wheels) Transfers: Sit to/from Stand, Bed to chair/wheelchair/BSC Sit to Stand: Supervision, Min assist Stand pivot transfers: Supervision         General transfer comment: Pt stood from lowest bed height without physical assist. She transferred to [facility] prior to ambulating. Pt demonstrated proper hand positioning and locking/unlocking of breaks of rollator. Pt required minA to power up from rollator seat. Good eccentric control with sitting.    Ambulation/Gait Ambulation/Gait assistance: Supervision Gait Distance (Feet): 250 Feet Assistive device: Rollator (4 wheels) Gait Pattern/deviations: Step-through pattern, Decreased stride length Gait velocity: decreased Gait velocity interpretation: <1.8 ft/sec, indicate of risk for recurrent falls   General Gait Details: Pt ambulated with a reciprocal gait pattern, even step length, equal weight shift, and good foot clearence. She maintained body inside rollator at all times. No LOB.   Stairs Stairs: Yes Stairs assistance: Contact guard assist Stair Management: One rail Right, Forwards, Step to pattern Number of Stairs: 3 General stair comments: Educated pt on stair training including sequencing and positioning of her family to support her based on home set up. Pt reports using SPC in opposite hand on stairs, provided HHA on LUE while ascending and RUE while descending. Pt led with RLE on the way up  and LLE on the way down. She was able to safely turn around on the 3rd step with multiple short pivots. Pt denied dizziness, lightheadeness. She took a seated rest break before and after stairs. Pt is very fearful,  but reported increased confidence and knows she has the strenght to do stairs at home.   Wheelchair Mobility     Tilt Bed    Modified Rankin (Stroke Patients Only)       Balance Overall balance assessment: Mild deficits observed, not formally tested                                          Communication Communication Communication: No apparent difficulties  Cognition Arousal: Alert Behavior During Therapy: WFL for tasks assessed/performed   PT - Cognitive impairments: No apparent impairments                         Following commands: Intact      Cueing Cueing Techniques: Verbal cues, Visual cues  Exercises      General Comments General comments (skin integrity, edema, etc.): VSS on RA. Pt's meal tray was delievered during session and she was eager to return as she has been NPO for her procedure. Pt had BM at start of session in [facility], RN notified.      Pertinent Vitals/Pain Pain Assessment Pain Assessment: No/denies pain    Home Living                          Prior Function            PT Goals (current goals can now be found in the care plan section) Acute Rehab PT Goals Patient Stated Goal: Return Home and continue to get better Progress towards PT goals: Progressing toward goals    Frequency    Min 2X/week      PT Plan      Co-evaluation              AM-PAC PT "6 Clicks" Mobility   Outcome Measure  Help needed turning from your back to your side while in a flat bed without using bedrails?: None Help needed moving from lying on your back to sitting on the side of a flat bed without using bedrails?: None Help needed moving to and from a bed to a chair (including a wheelchair)?: A Little Help needed standing up from a chair using your arms (e.g., wheelchair or bedside chair)?: A Little Help needed to walk in hospital room?: A Little Help needed climbing 3-5 steps with a railing? : A Little 6 Click  Score: 20    End of Session Equipment Utilized During Treatment: Gait belt Activity Tolerance: Patient tolerated treatment well Patient left: in bed;with call bell/phone within reach Nurse Communication: Mobility status PT Visit Diagnosis: Difficulty in walking, not elsewhere classified (R26.2);Muscle weakness (generalized) (M62.81)     Time: 9147-8295 PT Time Calculation (min) (ACUTE ONLY): 23 min  Charges:    $Gait Training: 23-37 mins PT General Charges $$ ACUTE PT VISIT: 1 Visit                     Glenford Lanes, PT, DPT Acute Rehabilitation Services Office: [phone] Secure Chat Preferred  Riva Chester 01/07/2024, 3:10 PM

## 2024-01-07 NOTE — Hospital Course (Addendum)
 Non pitting edema no jvd  BM this am.   N/V stopped yesterday.

## 2024-01-08 ENCOUNTER — Other Ambulatory Visit (HOSPITAL_COMMUNITY): Payer: Self-pay

## 2024-01-08 LAB — MAGNESIUM: Magnesium: 2 mg/dL (ref 1.7–2.4)

## 2024-01-08 LAB — BASIC METABOLIC PANEL WITH GFR
Anion gap: 7 (ref 5–15)
BUN: 18 mg/dL (ref 8–23)
CO2: 26 mmol/L (ref 22–32)
Calcium: 8.3 mg/dL — ABNORMAL LOW (ref 8.9–10.3)
Chloride: 101 mmol/L (ref 98–111)
Creatinine, Ser: 1.47 mg/dL — ABNORMAL HIGH (ref 0.44–1.00)
GFR, Estimated: 36 mL/min — ABNORMAL LOW (ref >60)
Glucose, Bld: 107 mg/dL — ABNORMAL HIGH (ref 70–99)
Potassium: 4.4 mmol/L (ref 3.5–5.1)
Sodium: 134 mmol/L — ABNORMAL LOW (ref 135–145)

## 2024-01-08 LAB — CBC
HCT: 28.5 % — ABNORMAL LOW (ref 36.0–46.0)
Hemoglobin: 9.3 g/dL — ABNORMAL LOW (ref 12.0–15.0)
MCH: 31.6 pg (ref 26.0–34.0)
MCHC: 32.6 g/dL (ref 30.0–36.0)
MCV: 96.9 fL (ref 80.0–100.0)
Platelets: 262 10*3/uL (ref 150–400)
RBC: 2.94 MIL/uL — ABNORMAL LOW (ref 3.87–5.11)
RDW: 15.7 % — ABNORMAL HIGH (ref 11.5–15.5)
WBC: 9.5 10*3/uL (ref 4.0–10.5)
nRBC: 0 % (ref 0.0–0.2)

## 2024-01-08 MED ORDER — FOLIC ACID 1 MG PO TABS
1.0000 mg | ORAL_TABLET | Freq: Every day | ORAL | 2 refills | Status: AC
  Filled 2024-01-08: qty 30, 30d supply, fill #0

## 2024-01-08 MED ORDER — APIXABAN (ELIQUIS) VTE STARTER PACK (10MG AND 5MG)
ORAL_TABLET | ORAL | 0 refills | Status: DC
  Filled 2024-01-08: qty 74, 30d supply, fill #0

## 2024-01-08 NOTE — Progress Notes (Signed)
 Mobility Specialist Progress Note:    01/08/24 1207  Mobility  Activity Ambulated with assistance in hallway  Level of Assistance Standby assist, set-up cues, supervision of patient - no hands on  Assistive Device Four wheel walker  Distance Ambulated (ft) 150 ft  Activity Response Tolerated well  Mobility Referral Yes  Mobility visit 1 Mobility  Mobility Specialist Start Time (ACUTE ONLY) 1130  Mobility Specialist Stop Time (ACUTE ONLY) 1145  Mobility Specialist Time Calculation (min) (ACUTE ONLY) 15 min   Received pt in bed having no complaints and agreeable to mobility. Pt took x1 seated rest break d/t fatigue, otherwise no c/o, VSS. Returned to room w/o fault. Left in bed w/ call bell in reach and all needs met.   Inetta Manes Mobility Specialist  Please contact vis Secure Chat or  Rehab Office [phone]

## 2024-01-08 NOTE — Plan of Care (Signed)
  Problem: Education: Goal: Knowledge of General Education information will improve Description: Including pain rating scale, medication(s)/side effects and non-pharmacologic comfort measures Outcome: Progressing   Problem: Health Behavior/Discharge Planning: Goal: Ability to manage health-related needs will improve Outcome: Progressing   Problem: Clinical Measurements: Goal: Ability to maintain clinical measurements within normal limits will improve Outcome: Progressing Goal: Will remain free from infection Outcome: Progressing Goal: Diagnostic test results will improve Outcome: Progressing Goal: Respiratory complications will improve Outcome: Progressing Goal: Cardiovascular complication will be avoided Outcome: Progressing   Problem: Activity: Goal: Risk for activity intolerance will decrease Outcome: Progressing   Problem: Nutrition: Goal: Adequate nutrition will be maintained Outcome: Progressing   Problem: Coping: Goal: Level of anxiety will decrease Outcome: Progressing   Problem: Elimination: Goal: Will not experience complications related to bowel motility Outcome: Progressing Goal: Will not experience complications related to urinary retention Outcome: Progressing   Problem: Pain Managment: Goal: General experience of comfort will improve and/or be controlled Outcome: Progressing   Problem: Safety: Goal: Ability to remain free from injury will improve Outcome: Progressing   Problem: Skin Integrity: Goal: Risk for impaired skin integrity will decrease Outcome: Progressing   Problem: Education: Goal: Knowledge of cardiac device and self-care will improve Outcome: Progressing Goal: Ability to safely manage health related needs after discharge will improve Outcome: Progressing Goal: Individualized Educational Video(s) Outcome: Progressing   Problem: Cardiac: Goal: Ability to achieve and maintain adequate cardiopulmonary perfusion will  improve Outcome: Progressing

## 2024-01-08 NOTE — Discharge Summary (Signed)
 [facility] Discharge Summary   Patient: Lisa [name] UVO:[id number]  PCP: Faustina Hood, MD  Date of admission: 12/28/2023   Date of discharge:  01/08/2024     Discharge Diagnoses:  Principal Problem:   ICD (implantable cardioverter-defibrillator) discharge   Admitted From: Home Disposition:  {comingfrom:22515} ***  Recommendations for Outpatient Follow-up:  Follow-up with PCP in 1 week.  Repeat CBC, BMP and magnesium  level after 1 to 2 weeks.  Repeat folic acid level after 3 to 6 months Follow-up with cardiology in 1 to 2 weeks Follow-up with pulmonary in 1 to 2 weeks, repeat chest x-ray for recurrent pleural effusion. Follow up LABS/TEST:  ***   Follow-up Information     [facility] Follow up.   Specialty: Home Health Services Why: Agency will contact you to set up apt times Contact information: [address] [place] [address] [phone]                Diet recommendation: {dietplan:22518}  Activity: The patient is advised to gradually reintroduce usual activities, as tolerated  Discharge Condition: stable  Code Status: {Palliative Code status:23503}   History of present illness: As per the H and P dictated on admission, "***"  Hospital Course:   Summary of her active problems in the hospital is as following.   *** Body mass index is 26.74 kg/m.    Nutrition Interventions:    Pressure Injury 12/29/23 Coccyx Stage 2 -  Partial thickness loss of dermis presenting as a shallow open injury with a red, pink wound bed without slough. (Active)  12/29/23 2000  Location: Coccyx  Location Orientation:   Staging: Stage 2 -  Partial thickness loss of dermis presenting as a shallow open injury with a red, pink wound bed without slough.  Wound Description (Comments):   Present on Admission: Yes  Dressing Type Foam - Lift dressing to assess site every shift 01/08/24 1144     ***Pain control  - [place]   Controlled Substance Reporting System database was reviewed. - *** day supply was provided. - Patient was instructed, not to drive, operate heavy machinery, perform activities at heights, swimming or participation in water  activities or provide baby sitting services while on Pain, Sleep and Anxiety Medications; until her outpatient Physician has advised to do so again.  - Also recommended to not to take more than prescribed Pain, Sleep and Anxiety Medications.  ***Patient was ambulatory without any assistance. ***Patient was seen by physical therapy, who recommended {d/c therapy plan:22521}, ***which was arranged. On the day of the discharge the patient's vitals were stable, and no other acute medical condition were reported by patient. the patient was felt safe to be discharge at {comingfrom:22515} with {d/c therapy plan:22521}.  Consultants: *** Procedures: ***  Discharge Exam: General: Appear in no distress, no Rash; Oral Mucosa Clear, moist. Cardiovascular: S1 and S2 Present, no Murmur, Respiratory: normal respiratory effort, Bilateral Air entry present and no Crackles, no wheezes Abdomen: Bowel Sound present, Soft and no tenderness, no hernia Extremities: no Pedal edema, no calf tenderness Neurology: alert and oriented to time, place, and person affect appropriate.  Filed Weights   01/06/24 0338 01/07/24 0437 01/08/24 0428  Weight: 67.2 kg 68.1 kg 64.2 kg   Vitals:   01/08/24 0717 01/08/24 1113  BP: (!) 140/54 (!) 141/63  Pulse: 64 73  Resp: 18 20  Temp: 98.2 F (36.8 C) 97.6 F (36.4 C)  SpO2: 97% 100%    DISCHARGE MEDICATION: Allergies as  of 01/08/2024       Reactions   Flagyl [metronidazole] Itching, Rash, Other (See Comments)   Welts, also   Penicillins Hives, Itching, Rash   Previously tolerated ancef  + rocephin    Sulfamethoxazole Rash   Dilaudid  [hydromorphone  Hcl] Nausea And Vomiting   Meperidine Nausea And Vomiting   Propoxyphene    Other reaction(s):  Unknown   Tape Other (See Comments)   "THE PLASTIC, CLEAR TAPE CAN/DOES PULL OFF MY SKIN."   Doxycycline  Nausea Only, Rash, Other (See Comments)   Made her "feel terrible"   Lanolin Itching, Rash   Sulfa Antibiotics Rash        Medication List     STOP taking these medications    aluminum hydroxide-magnesium  carbonate 95-358 MG/15ML Susp Commonly known as: GAVISCON   ascorbic acid  500 MG tablet Commonly known as: VITAMIN C    b complex vitamins capsule   imatinib  400 MG tablet Commonly known as: GLEEVEC    multivitamin with minerals Tabs tablet       TAKE these medications    acetaminophen  500 MG tablet Commonly known as: TYLENOL  Take 1,000 mg by mouth every 6 (six) hours as needed for moderate pain (pain score 4-6).   allopurinol  100 MG tablet Commonly known as: ZYLOPRIM  Take 100 mg by mouth daily.   amiodarone  200 MG tablet Commonly known as: PACERONE  Take 1 tablet (200 mg total) by mouth at bedtime.   Apixaban Starter Pack (10mg  and 5mg ) Commonly known as: ELIQUIS STARTER PACK Start on June 3rd as per cardiology Take as directed on package: start with two-5mg  tablets twice daily for 7 days. On day 8, switch to one-5mg  tablet twice daily. Start taking on: January 11, 2024   aspirin  EC 81 MG tablet Take 81 mg by mouth in the morning.   atorvastatin  20 MG tablet Commonly known as: LIPITOR Take 1 tablet (20 mg total) by mouth daily.   calcium  carbonate 1250 (500 Ca) MG tablet Commonly known as: OS-CAL - dosed in mg of elemental calcium  Take 1 tablet by mouth daily as needed (Calcium  levels).   folic acid 1 MG tablet Commonly known as: FOLVITE Take 1 tablet (1 mg total) by mouth daily.   MAGNESIUM  GLYCINATE PO Take 1 tablet by mouth at bedtime.   nitroGLYCERIN  0.4 MG SL tablet Commonly known as: NITROSTAT  Place 1 tablet (0.4 mg total) under the tongue every 5 (five) minutes x 3 doses as needed for chest pain.   potassium chloride  10 MEQ  tablet Commonly known as: KLOR-CON  M Take 1 tablet (10 mEq total) by mouth as needed. What changed: when to take this   thyroid  15 MG tablet Commonly known as: ARMOUR Take 15 mg by mouth daily before breakfast.   torsemide  20 MG tablet Commonly known as: DEMADEX  Take 1 tablet (20 mg total) by mouth daily. What changed:  when to take this additional instructions   VITAMIN B12 SL Place 1 tablet under the tongue daily.   VITAMIN D -3 PO Take 5,000 Units by mouth daily.               Discharge Care Instructions  (From admission, onward)           Start     Ordered   01/08/24 0000  Discharge wound care:       Comments: As above   01/08/24 1335           Allergies  Allergen Reactions   Flagyl [Metronidazole] Itching, Rash and Other (See  Comments)    Welts, also   Penicillins Hives, Itching and Rash    Previously tolerated ancef  + rocephin    Sulfamethoxazole Rash   Dilaudid  [Hydromorphone  Hcl] Nausea And Vomiting   Meperidine Nausea And Vomiting   Propoxyphene     Other reaction(s): Unknown   Tape Other (See Comments)    "THE PLASTIC, CLEAR TAPE CAN/DOES PULL OFF MY SKIN."   Doxycycline  Nausea Only, Rash and Other (See Comments)    Made her "feel terrible"   Lanolin Itching and Rash   Sulfa Antibiotics Rash   Discharge Instructions     Call MD for:  difficulty breathing, headache or visual disturbances   Complete by: As directed    Call MD for:  extreme fatigue   Complete by: As directed    Call MD for:  persistant dizziness or light-headedness   Complete by: As directed    Call MD for:  persistant nausea and vomiting   Complete by: As directed    Call MD for:  redness, tenderness, or signs of infection (pain, swelling, redness, odor or green/yellow discharge around incision site)   Complete by: As directed    Call MD for:  severe uncontrolled pain   Complete by: As directed    Call MD for:  temperature >100.4   Complete by: As directed    Diet  - low sodium heart healthy   Complete by: As directed    Discharge instructions   Complete by: As directed    Follow-up with PCP in 1 week.  Repeat CBC, BMP and magnesium  level after 1 to 2 weeks.  Repeat folic acid level after 3 to 6 months Follow-up with cardiology in 1 to 2 weeks Follow-up with pulmonary in 1 to 2 weeks, repeat chest x-ray for recurrent pleural effusion.   Discharge wound care:   Complete by: As directed    As above   Increase activity slowly   Complete by: As directed        The results of significant diagnostics from this hospitalization (including imaging, microbiology, ancillary and laboratory) are listed below for reference.    Significant Diagnostic Studies: VAS US  LOWER EXTREMITY VENOUS (DVT) Result Date: 01/06/2024  Lower Venous DVT Study Patient Name:  TALLYN HOLROYD  Date of Exam:   01/06/2024 Medical Rec #: [id number]           Accession #:    [id number] Date of Birth: 09/19/1940            Patient Gender: F Patient Age:   83 years Exam Location:  [facility] Procedure:      VAS US  LOWER EXTREMITY VENOUS (DVT) Referring Phys: Deforest Fast --------------------------------------------------------------------------------  Indications: Edema, and pulmonary embolism.  Risk Factors: Confirmed PE. Anticoagulation: Heparin . Limitations: Poor ultrasound/tissue interface. Comparison Study: No prior studies. Performing Technologist: Lerry Ransom RVT  Examination Guidelines: A complete evaluation includes B-mode imaging, spectral Doppler, color Doppler, and power Doppler as needed of all accessible portions of each vessel. Bilateral testing is considered an integral part of a complete examination. Limited examinations for reoccurring indications may be performed as noted. The reflux portion of the exam is performed with the patient in reverse Trendelenburg.  +---------+---------------+---------+-----------+----------+--------------+ RIGHT     CompressibilityPhasicitySpontaneityPropertiesThrombus Aging +---------+---------------+---------+-----------+----------+--------------+ CFV      Full           Yes      Yes                                 +---------+---------------+---------+-----------+----------+--------------+  SFJ      Full                                                        +---------+---------------+---------+-----------+----------+--------------+ FV Prox  Full                                                        +---------+---------------+---------+-----------+----------+--------------+ FV Mid   Full                                                        +---------+---------------+---------+-----------+----------+--------------+ FV DistalFull                                                        +---------+---------------+---------+-----------+----------+--------------+ PFV      Full                                                        +---------+---------------+---------+-----------+----------+--------------+ POP      Full           Yes      Yes                                 +---------+---------------+---------+-----------+----------+--------------+ PTV      Full                                                        +---------+---------------+---------+-----------+----------+--------------+ PERO     Full                                                        +---------+---------------+---------+-----------+----------+--------------+   +---------+---------------+---------+-----------+----------+--------------+ LEFT     CompressibilityPhasicitySpontaneityPropertiesThrombus Aging +---------+---------------+---------+-----------+----------+--------------+ CFV      Full           Yes      Yes                                 +---------+---------------+---------+-----------+----------+--------------+ SFJ      Full                                                         +---------+---------------+---------+-----------+----------+--------------+  FV Prox  Full                                                        +---------+---------------+---------+-----------+----------+--------------+ FV Mid   Full                                                        +---------+---------------+---------+-----------+----------+--------------+ FV DistalFull                                                        +---------+---------------+---------+-----------+----------+--------------+ PFV      Full                                                        +---------+---------------+---------+-----------+----------+--------------+ POP      Full           Yes      Yes                                 +---------+---------------+---------+-----------+----------+--------------+ PTV      Full                                                        +---------+---------------+---------+-----------+----------+--------------+ PERO     Full                                                        +---------+---------------+---------+-----------+----------+--------------+     Summary: RIGHT: - There is no evidence of deep vein thrombosis in the lower extremity.  - No cystic structure found in the popliteal fossa.  LEFT: - There is no evidence of deep vein thrombosis in the lower extremity.  - No cystic structure found in the popliteal fossa.  *See table(s) above for measurements and observations. Electronically signed by Runell Countryman on 01/06/2024 at 6:20:21 PM.    Final    DG Abd 1 View Result Date: 01/01/2024 CLINICAL DATA:  [id number] Constipation [id number] EXAM: ABDOMEN - 1 VIEW COMPARISON:  10/03/2015 FINDINGS: Stomach and small bowel are nondistended. There is moderate gas and fecal material in the colon without dilatation. AICD leads partially visualized. Aortic Atherosclerosis (ICD10-170.0). IMPRESSION: Nonobstructive bowel gas pattern.  Electronically Signed   By: Nicoletta Barrier M.D.   On: 01/01/2024 10:14   CT Angio Chest Pulmonary Embolism (PE) W or WO Contrast Result Date: 12/30/2023 CLINICAL DATA:  Positive D-dimer.  Concern for pulmonary embolism. EXAM: CT ANGIOGRAPHY CHEST WITH  CONTRAST TECHNIQUE: Multidetector CT imaging of the chest was performed using the standard protocol during bolus administration of intravenous contrast. Multiplanar CT image reconstructions and MIPs were obtained to evaluate the vascular anatomy. RADIATION DOSE REDUCTION: This exam was performed according to the departmental dose-optimization program which includes automated exposure control, adjustment of the mA and/or kV according to patient size and/or use of iterative reconstruction technique. CONTRAST:  60mL OMNIPAQUE  IOHEXOL  350 MG/ML SOLN COMPARISON:  CT dated 12/13/2023. FINDINGS: Cardiovascular: There is no cardiomegaly or pericardial effusion. Left pectoral pacemaker device. There is coronary vascular calcification. Moderate atherosclerotic calcification of the thoracic aorta. There is right lower lobe segmental and subsegmental pulmonary artery emboli. No evidence of right heart straining. Mediastinum/Nodes: No hilar or mediastinal adenopathy. The esophagus is grossly unremarkable. No mediastinal fluid collection. Lungs/Pleura: Small right pleural effusion. There has been interval removal of the left-sided chest tube. Left hydropneumothorax. Significant decrease in the size of the pleural air on the left compared to prior CT with development of moderate pleural fluid. There is associated compressive atelectasis of the adjacent lungs. The central airways are patent. Upper Abdomen: No acute abnormality. Musculoskeletal: Bilateral breast implants. No acute osseous pathology. Osteopenia. Review of the MIP images confirms the above findings. IMPRESSION: 1. Right lower lobe segmental and subsegmental pulmonary artery emboli. No evidence of right heart straining. 2.  Small right pleural effusion. 3. Left hydropneumothorax with significant decrease in the size of the pleural air compared to prior CT with development of moderate pleural fluid. 4.  Aortic Atherosclerosis (ICD10-I70.0). These results were called by telephone at the time of interpretation on 12/30/2023 at 3:40 pm to provider [facility] , who verbally acknowledged these results. Electronically Signed   By: Angus Bark M.D.   On: 12/30/2023 15:59   NM Pulmonary Perfusion Result Date: 12/29/2023 CLINICAL DATA:  Positive D-dimer. EXAM: NUCLEAR MEDICINE PERFUSION LUNG SCAN TECHNIQUE: Perfusion images were obtained in multiple projections after intravenous injection of radiopharmaceutical. Ventilation scans intentionally deferred if perfusion scan and chest x-ray adequate for interpretation during COVID 19 epidemic. RADIOPHARMACEUTICALS:  4.4  mCi Tc-12m MAA IV COMPARISON:  Chest x-ray 12/29/2023. FINDINGS: Decreased perfusion in the left lower lobe corresponds to left pleural effusion seen on x-ray. There is some small to moderate-sized areas of decreased perfusion in the posterior right upper lobe seen on lateral and RPO views, but not confirmed on other views. IMPRESSION: 1. Intermediate probability for pulmonary embolism. Electronically Signed   By: Tyron Gallon M.D.   On: 12/29/2023 16:32   DG Chest 2 View Result Date: 12/29/2023 CLINICAL DATA:  Chest pain EXAM: CHEST - 2 VIEW COMPARISON:  12/16/2023 FINDINGS: Cardiac shadow is stable. Defibrillator is again noted. Previously seen left apical pneumothorax is no longer identified. No focal infiltrate or sizable effusion is seen. No bony abnormality is noted. IMPRESSION: No acute abnormality noted. Electronically Signed   By: Violeta Grey M.D.   On: 12/29/2023 00:09   DG Chest Port 1 View Result Date: 12/16/2023 CLINICAL DATA:  Follow-up pneumothorax. EXAM: PORTABLE CHEST 1 VIEW COMPARISON:  Chest radiograph dated 12/15/2023. FINDINGS: No significant  interval change in the left apical pneumothorax. Small left pleural effusion. Stable cardiac silhouette. Left pectoral AICD device. No acute osseous pathology. IMPRESSION: 1. No significant interval change in the left apical pneumothorax. 2. Small left pleural effusion. Electronically Signed   By: Angus Bark M.D.   On: 12/16/2023 10:22   CT HIP RIGHT WO CONTRAST Result Date: 12/15/2023 CLINICAL DATA:  None chronic  right hip pain.  Dysplasia suspected. EXAM: CT OF THE RIGHT HIP WITHOUT CONTRAST TECHNIQUE: Multidetector CT imaging of the right hip was performed according to the standard protocol. Multiplanar CT image reconstructions were also generated. RADIATION DOSE REDUCTION: This exam was performed according to the departmental dose-optimization program which includes automated exposure control, adjustment of the mA and/or kV according to patient size and/or use of iterative reconstruction technique. COMPARISON:  CT abdomen pelvis 12/04/2023, left hip radiographs 11/25/2015 FINDINGS: Bones/Joint/Cartilage Mildly decreased bone mineralization. Moderate to severe pubic symphysis joint space narrowing with subchondral cystic change again seen at the posterior aspect and moderate-to-large spur extending superiorly and medially from the superior aspect of the right pubic body approximately 14 mm (coronal series 8 images 53-59). There is partially visualized degenerative osseous fusion of the posterior aspect of the right sacroiliac joint, as completely visualized on recent 12/04/2023 CT. Mild right sacroiliac subchondral sclerosis and degenerative vacuum phenomenon. Moderate superolateral right femoroacetabular joint space narrowing. There is normal coverage of the superior right femoral head without evidence of acetabular dysplasia. Mild-to-moderate superolateral right acetabular degenerative osteophytosis, similar to prior. Ligaments Suboptimally assessed by CT. Muscles and Tendons Normal size and density of  the regional musculature. No tendon tear is seen. Soft tissues The uterus is surgically absent. Mild high-grade atherosclerotic calcifications. IMPRESSION: 1. 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 No evidence of acetabular dysplasia. 2. 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 3. 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 Electronically Signed   By: Bertina Broccoli M.D.   On: 12/15/2023 11:17   DG CHEST PORT 1 VIEW Result Date: 12/15/2023 CLINICAL DATA:  Encounter for chest tube placement. EXAM: PORTABLE CHEST 1 VIEW COMPARISON:  12/14/2023 FINDINGS: Interval removal of the left-sided pigtail thoracostomy tube. Unchanged appearance of left-sided pneumothorax which measures 3.2 cm above the left apex. On the previous exam this measured 3.5 cm. Left chest wall ICD noted with leads in the right atrial appendage, coronary sinus and right ventricle. Heart size and mediastinal contours are stable. Scar versus atelectasis in the left base. Small left pleural effusion. No airspace opacities. IMPRESSION: 1. Interval removal of left-sided pigtail thoracostomy tube. 2. Unchanged appearance of left-sided pneumothorax. This measures 3.2 cm above the left apex. 3. Small left pleural effusion. These results will be called to the ordering clinician or representative by the Radiologist Assistant, and communication documented in the PACS or [employer]. Electronically Signed   By: Kimberley Penman M.D.   On: 12/15/2023 06:57   DG Chest Port 1 View Result Date: 12/14/2023 CLINICAL DATA:  Pneumothorax. EXAM: PORTABLE CHEST 1 VIEW COMPARISON:  Dec 12, 2023.  Dec 13, 2023. FINDINGS: The heart size and mediastinal contours are within normal limits. Left-sided defibrillator is unchanged. Left-sided chest tube is unchanged. Mild left apical and basilar pneumothorax is noted. Right lung is clear. The visualized skeletal structures are unremarkable.  IMPRESSION: Stable left-sided chest tube. Mild left apical and basilar pneumothorax is noted. Electronically Signed   By: Rosalene Colon M.D.   On: 12/14/2023 09:02   CT CHEST WO CONTRAST Result Date: 12/13/2023 CLINICAL DATA:  Pneumothorax.  Persistent air leak right chest tube. EXAM: CT CHEST WITHOUT CONTRAST TECHNIQUE: Multidetector CT imaging of the chest was performed following the standard protocol without IV contrast. RADIATION DOSE REDUCTION: This exam was performed according to the departmental dose-optimization program which includes automated exposure control, adjustment of the mA and/or kV according to patient size and/or use of iterative reconstruction  technique. COMPARISON:  Chest CT dated 12/05/2023. FINDINGS: Evaluation of this exam is limited in the absence of intravenous contrast. Cardiovascular: There is no cardiomegaly or pericardial effusion. There is coronary vascular calcification or stent of the LAD. Left pectoral pacemaker device. Moderate atherosclerotic calcification of the thoracic aorta. No aneurysmal dilatation. The central pulmonary arteries are grossly unremarkable. Mediastinum/Nodes: No hilar or mediastinal adenopathy. The esophagus is grossly unremarkable. No mediastinal fluid collection. Lungs/Pleura: Interval decrease in the size of the left pneumothorax. Significant decrease in the size of the fluid content in the left pleural space. Minimal residual fluid noted. Left-sided chest tube with tip in the medial left lung base pleural surface. Areas of atelectasis in the left lung. The right lung is clear. The central airways are patent. Upper Abdomen: No acute abnormality. Musculoskeletal: Bilateral breast implants. Degenerative changes of the spine and osteopenia. No acute osseous pathology. IMPRESSION: 1. Interval decrease in the size of the left pneumothorax and near complete resolution of left pleural fluid. 2. Left-sided chest tube with tip in the medial left lung base  pleural surface. 3.  Aortic Atherosclerosis (ICD10-I70.0). Electronically Signed   By: Angus Bark M.D.   On: 12/13/2023 10:40   DG CHEST PORT 1 VIEW Result Date: 12/12/2023 CLINICAL DATA:  [id number] Pneumothorax [id number] EXAM: PORTABLE CHEST - 1 VIEW COMPARISON:  12/11/2023 FINDINGS: Stable pigtail catheter medially at the left lung base, without definite pneumothorax. Lungs are clear. Stable left subclavian AICD. Heart size and mediastinal contours are within normal limits. Aortic Atherosclerosis (ICD10-170.0). No effusion. Visualized bones unremarkable. IMPRESSION: Stable left basilar pigtail catheter without definite pneumothorax. Electronically Signed   By: Nicoletta Barrier M.D.   On: 12/12/2023 13:09   DG Chest Port 1V same Day Result Date: 12/11/2023 CLINICAL DATA:  83 year old female status post pleural catheter placement 4 effusion, left hydropneumothorax. EXAM: PORTABLE CHEST 1 VIEW COMPARISON:  Chest CT 12/04/2024. Portable chest yesterday and earlier. FINDINGS: Portable AP semi upright view at 0834 hours. Pigtail left lung base pleural catheter is stable. Substantially improved left hydropneumothorax since 12/05/2023. Since yesterday the residual left apical pneumothorax is less apparent. Pleural edge could be obscured by left chest AICD. Stable cardiac size and mediastinal contours. No mediastinal shift. No confluent lung opacity or areas of worsening ventilation. Visualized tracheal air column is within normal limits. Stable visualized osseous structures. Paucity of bowel gas. IMPRESSION: Stable left pleural catheter. Suspected small residual left pneumothorax, otherwise normal ventilation. Electronically Signed   By: Marlise Simpers M.D.   On: 12/11/2023 11:32   DG CHEST PORT 1 VIEW Result Date: 12/10/2023 CLINICAL DATA:  Pneumothorax. EXAM: PORTABLE CHEST 1 VIEW COMPARISON:  One-view chest x-ray 12/09/2023 FINDINGS: The heart is enlarged. Atherosclerotic changes are present at the aortic arch. Pacing and  defibrillator wires are noted. Aeration is improved. A small left apical pneumothorax is stable. The left basilar pneumothorax is no longer visualized. IMPRESSION: 1. Stable small left apical pneumothorax. 2. Interval resolution of left basilar pneumothorax. 3. Improved aeration bilaterally. Electronically Signed   By: Audree Leas M.D.   On: 12/10/2023 11:49    Microbiology: No results found for this or any previous visit (from the past 240 hours).   Labs: CBC: Recent Labs  Lab 01/03/24 0313 01/04/24 0308 01/06/24 0247 01/07/24 0224 01/08/24 0252  WBC 11.9* 8.3 8.0 8.0 9.5  HGB 8.7* 7.7* 8.3* 8.5* 9.3*  HCT 26.5* 23.9* 25.0* 25.6* 28.5*  MCV 96.7 96.4 96.2 96.2 96.9  PLT 313 261 284 297 262  Basic Metabolic Panel: Recent Labs  Lab 01/04/24 0308 01/05/24 0226 01/06/24 0247 01/07/24 0224 01/08/24 0252  NA 134* 134* 137 134* 134*  K 4.0 3.8 3.3* 4.4 4.4  CL 102 99 100 99 101  CO2 27 27 28 28 26   GLUCOSE 90 105* 118* 120* 107*  BUN 25* 23 21 19 18   CREATININE 1.56* 1.54* 1.54* 1.43* 1.47*  CALCIUM  8.0* 8.0* 8.3* 8.1* 8.3*  MG 2.5* 2.0 1.7 2.3 2.0   Liver Function Tests: No results for input(s): "AST", "ALT", "ALKPHOS", "BILITOT", "PROT", "ALBUMIN " in the last 168 hours. No results for input(s): "LIPASE", "AMYLASE" in the last 168 hours. No results for input(s): "AMMONIA" in the last 168 hours. Cardiac Enzymes: No results for input(s): "CKTOTAL", "CKMB", "CKMBINDEX", "TROPONINI" in the last 168 hours. BNP (last 3 results) Recent Labs    08/16/23 0232 08/17/23 0306 12/29/23 0559  BNP 246.6* 234.2* 447.5*   CBG: No results for input(s): "GLUCAP" in the last 168 hours.  Time spent: 35 minutes  Signed:  Althia Atlas  [facility]  ***01/08/2024 2:23 PM

## 2024-01-08 NOTE — TOC Transition Note (Signed)
 Transition of Care [facility]) - Discharge Note   Patient Details  Name: Lisa [name] MRN: [id number] Date of Birth: Oct 14, 1940  [facility] CM/SW Contact:  Omie Bickers, RN Phone Number: 01/08/2024, 2:33 PM   Clinical Narrative:      Rise Cheng that patient will DC today.        Patient Goals and CMS Choice            Discharge Placement                       Discharge Plan and Services Additional resources added to the After Visit Summary for                                       Social Drivers of Health (SDOH) Interventions SDOH Screenings   Food Insecurity: No Food Insecurity (12/29/2023)  Housing: Low Risk  (12/29/2023)  Transportation Needs: No Transportation Needs (12/29/2023)  Utilities: Not At Risk (12/29/2023)  Financial Resource Strain: Low Risk  (10/28/2022)   Received from [facility], [facility]  Social Connections: Unknown (12/29/2023)  Recent Concern: Social Connections - Moderately Isolated (12/07/2023)  Tobacco Use: Low Risk  (12/30/2023)     Readmission Risk Interventions    08/10/2023    2:10 PM 07/19/2023    3:29 PM  Readmission Risk Prevention Plan  Transportation Screening Complete Complete  PCP or Specialist Appt within 5-7 Days  Complete  PCP or Specialist Appt within 3-5 Days Complete   Home Care Screening  Complete  Medication Review (RN CM)  Complete  HRI or Home Care Consult Complete   Palliative Care Screening Not Applicable   Medication Review (RN Care Manager) Complete

## 2024-01-08 NOTE — Progress Notes (Signed)
   Rounding Note    Patient Name: BRITANEE VANBLARCOM Date of Encounter: 01/08/2024  [facility] Cardiologist: Ola Berger, MD   Subjective   Doing well this AM. Minimal pain in the left shoulder. Pressure bandage in place.  Vital Signs    Vitals:   01/07/24 1939 01/08/24 0041 01/08/24 0428 01/08/24 0717  BP: (!) 131/57 (!) 127/50 (!) 119/59 (!) 140/54  Pulse: 66 65 78 64  Resp: (!) 21 16 17 18   Temp: 98.2 F (36.8 C) 97.8 F (36.6 C) 98.5 F (36.9 C) 98.2 F (36.8 C)  TempSrc: Oral Oral Oral Oral  SpO2: 99% 100% 99% 97%  Weight:   64.2 kg   Height:        Intake/Output Summary (Last 24 hours) at 01/08/2024 0847 Last data filed at 01/07/2024 2336 Gross per 24 hour  Intake 120 ml  Output 1150 ml  Net -1030 ml      01/08/2024    4:28 AM 01/07/2024    4:37 AM 01/06/2024    3:38 AM  Last 3 Weights  Weight (lbs) 141 lb 8.6 oz 150 lb 2.1 oz 148 lb 2.4 oz  Weight (kg) 64.2 kg 68.1 kg 67.2 kg      Telemetry    Personally Reviewed   Physical Exam    GEN: No acute distress.  Elderly. Cardiac: RRR, no murmurs, rubs, or gallops. L prepectoral pocket healing. Pressure bandage in place. Respiratory: Clear to auscultation bilaterally. Psych: Normal affect   Assessment & Plan    #ICD in situ #s/p generator replacement this admission Feeling OK after gen change yesterday. Maintain pressure bandage until early this week when removed in clinic.  Restart OAC 01/11/2024  #PE Restart OAC 01/11/2024  #VT Continue amiodarone  200mg  PO daily  EP will sign off. Please call with questions or concerns.  Donelda Fujita T. Marven Slimmer, MD, [facility], [facility] Cardiac Electrophysiology

## 2024-01-08 NOTE — Plan of Care (Signed)
   Problem: Health Behavior/Discharge Planning: Goal: Ability to manage health-related needs will improve Outcome: Progressing   Problem: Clinical Measurements: Goal: Ability to maintain clinical measurements within normal limits will improve Outcome: Progressing

## 2024-01-10 ENCOUNTER — Ambulatory Visit: Attending: Cardiovascular Disease

## 2024-01-10 ENCOUNTER — Ambulatory Visit (HOSPITAL_BASED_OUTPATIENT_CLINIC_OR_DEPARTMENT_OTHER)

## 2024-01-10 ENCOUNTER — Telehealth: Payer: Self-pay | Admitting: Internal Medicine

## 2024-01-10 DIAGNOSIS — Z95810 Presence of automatic (implantable) cardiac defibrillator: Secondary | ICD-10-CM

## 2024-01-10 NOTE — Telephone Encounter (Signed)
  1. Has your device fired? no  2. Is you device beeping? no  3. Are you experiencing draining or swelling at device site? No, patient would like to know when she can take the bandage off.  She just had her defib changed on Friday.   4. Are you calling to see if we received your device transmission? no  5. Have you passed out? no   Please route to Device Clinic Pool

## 2024-01-10 NOTE — Telephone Encounter (Signed)
 Patient has pressure dressing in place. Per Dr. Candace Cerise note 5/31 - she needs it removed in clinic early this week. Patient can only come today.  We will see in clinic today.

## 2024-01-10 NOTE — Telephone Encounter (Signed)
 I spoke with the pt and she says that when [facility] came out they also told her that she did not need nursing but just for ont more visit... she will however have PT coming to the house... says that if she needs us  she will let us  know.. she say that hs is feeling well and agrees that if nursing is needed thereafter she will be open to having them.

## 2024-01-10 NOTE — Progress Notes (Signed)
 Pressure dressing removed per discharge instructions by Dr. Marven Slimmer.  Wound site is healing appropriately, minimal swelling at site, no signs of active hematoma.  Steri strips intact, wound care and monitoring instructions given.  She knows to call the device clinic if any signs of infection or swelling occur.   Noted that patient is to restart her OAC (Eliquis) tomorrow.  DC summary states patient is to take 10mg  twice daily X 7 days then decrease to 1 (5mg ) tablet twice daily thereafter.  Concerns for bleeding, reviewed with Dr. Carolynne Citron who orders:  Not to take 10 bid, place on 5mg  bid to start tomorrow (6/3) evening and bring patient back in 1 week to re-assess for bleeding. Patient is a high bleeding risk and needs close monitoring. Patient aware and follow up appointment made.

## 2024-01-10 NOTE — Telephone Encounter (Signed)
 Tonya from [facility] stated when pt left hospital they ordered nursing but pt doesn't want nursing care just therapy. Please advise.

## 2024-01-11 NOTE — Progress Notes (Signed)
 Remote ICD transmission.

## 2024-01-12 ENCOUNTER — Encounter: Payer: Self-pay | Admitting: Emergency Medicine

## 2024-01-13 ENCOUNTER — Encounter: Payer: Self-pay | Admitting: Internal Medicine

## 2024-01-13 ENCOUNTER — Encounter (HOSPITAL_COMMUNITY): Payer: Self-pay | Admitting: Internal Medicine

## 2024-01-13 NOTE — Telephone Encounter (Signed)
 Tonya from [facility] called. She said she did not tell patient that she doesn't need nursing. She just wanted us  to know

## 2024-01-14 ENCOUNTER — Other Ambulatory Visit (HOSPITAL_COMMUNITY): Payer: Self-pay

## 2024-01-14 NOTE — Telephone Encounter (Signed)
 Per Chrys Cranker, paper work was signed by Dr Avanell Bob and sent off. Closing encounter.

## 2024-01-17 ENCOUNTER — Ambulatory Visit: Attending: Internal Medicine

## 2024-01-17 NOTE — Progress Notes (Signed)
 Patient in today for wound re-check for hematoma following initiation of Eliquis  after pressure dressing removal 1 week ago.  Area is healing very well, no signs of swelling/hematoma or infection.  She continues to take Eliquis  as prescribed.  Strips are intact and left in place until she comes for her 14 day post op check on 01/20/24 with Mertha Abrahams, PA-C.  She did start today with showering.  Patient states she feels much better and is getting stronger every day.  No concerns.

## 2024-01-19 ENCOUNTER — Other Ambulatory Visit (HOSPITAL_COMMUNITY): Payer: Self-pay

## 2024-01-19 NOTE — Progress Notes (Signed)
 Cardiology Office Note:  .   Date:  01/19/2024  ID:  Lisa [name], DOB May 05, 1941, MRN [id number] PCP: Faustina Hood, MD  [facility] Providers Cardiologist:  Ola Berger, MD Electrophysiologist:  Manya Sells, MD {  History of Present Illness: Lisa [name]   Lisa [name] is a 83 y.o. 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, treated with Gleevac)   Of late 07/2023 with R leg cellulitis, bilateral lymphedemia, pseudomonas infection, a/c CHF, pleural effusion requiring thoracentesis   Jan 2025  Readmitted with recurrent left sided pleural effusion, acute hypoxic respiratory failure, acute on chronic HFimpEF.  L thora hypoalbuminemia GDMT: limited in the setting of renal function/BP. MRA not pursued due to this. Felt high risk for UTI so no SGLT2i. Metolazone  discontinued. Now on torsemide  20mg  PO BID. Toprol  held due to hypotension.   12/04/23:  flank pain for several days. She was previously seen at urgent care with a diagnosis of a UTI. Urine cultures were positive for E. coli. With her flank pain, she had decreased p.o. intake with nausea. While in the emergency room, she went into ventricular tachycardia  Again in the setting of acute illness IV amiodarone  > PO Known ERI > planned for discharge and gen change out pt Recurrent pleural effusion > L hydropneuothorax (after a paracentesis on 4/25) > CT Negative BC (x2) for 5 days Discharged 12/15/13  She saw Dr Avanell Bob 12/20/23, no syncope, shocks, denied symptoms.  Volume status was ok No changes made  Admitted 12/28/23 with ICD shock and c/o persistent dizziness.  She was asleep at the time of ICD shock and was unaware, she reported generally feeling BAD, noted to have some NSVTs on telemetry, uncertain if related to any of her dizziness ICD shock was appropriate for VF > successful Device had reached ERI as of  10/23/23 Amiodarone  increased to 200mg  BID > recs to discharge back on daily Recommended given of recent admissions and on-going issues. Appears to have failure to thrive with gradual downtrend over the past several months, GOC discussions would be very reasonable She was also found to have PE and new AFib Dr. Carolynne Citron advised a/c though would need to be stopped ahead of/after en change procedure, felt this could be moved out some Ultimately underwent ICD gen change 01/07/24 Discharged 01/08/24 Eliquis  to start 01/11/24  Today's visit is scheduled as her post gen change wound visit ROS:   She is doing well since home No CP, palpitations or cardiac awareness No syncope, near syncope or shocks No wound concerns, hopes the tape will come off. No bleeding or signs of bleeding  She has wraps b/l LE that she takes care of, reports intact skin with no wounds  Device information MDT CRT-D, implanted 01/20/2016, gen change 01/07/24   + appropriate therapies Feb 2021,  3 treated VF-zone episodes:  1 PMVT/VF episode 09/21/19 at 02:01, converted to AF/VS rhythm after 35J shock,  1 episode of AF w/RVR vs AF/VT (double tachycardia) on 09/21/19 at 03:15, ATP x1 failed, late break after 35J shock x1 (and conversion of AF to SR).  CATH this admission with NOD, LVEF 50% Started on amiodarone   Oct 2023  9  appropriately treated episodes for VF 13 NSVT episodes all ATPs failed, all shocks were successful  Suspected provoked by acute febrile illness and HF exacerbation Amio gtt/re-loaded Not felt to need  a new ischemic evaluation  April 2024 VT  AAD hx Amiodarone  started 2021   Studies Reviewed: Lisa [name]    EKG not done today  DEVICE interrogation done today and reviewed by myself Battery and lead measurements are good No arrhythmias Effective BP 98.1%  April 02/2024: TTE 1. Left ventricular ejection fraction, by estimation, is 40 to 45%. The  left ventricle has mildly decreased function. The left  ventricle has no  regional wall motion abnormalities. Left ventricular diastolic parameters  are consistent with Grade I  diastolic dysfunction (impaired relaxation).   2. Right ventricular systolic function is normal. The right ventricular  size is normal.   3. The mitral valve is normal in structure. No evidence of mitral valve  regurgitation. No evidence of mitral stenosis.   4. The aortic valve is normal in structure. Aortic valve regurgitation is  not visualized. No aortic stenosis is present.   5. The inferior vena cava is normal in size with greater than 50%  respiratory variability, suggesting right atrial pressure of 3 mmHg.   Comparison(s): No significant change from prior study. Prior images  reviewed side by side.    echo 08/09/23  1. Left ventricular ejection fraction, by estimation, is 55 to 60%. The  left ventricle has normal function. The left ventricle has no regional  wall motion abnormalities. Left ventricular diastolic parameters are  consistent with Grade I diastolic  dysfunction (impaired relaxation).   2. Right ventricular systolic function is normal. The right ventricular  size is normal.   3. Large pleural effusion.   4. The mitral valve is normal in structure. No evidence of mitral valve  regurgitation. No evidence of mitral stenosis.   5. The aortic valve is normal in structure. Aortic valve regurgitation is  not visualized. No aortic stenosis is present.   6. The inferior vena cava is dilated in size with <50% respiratory  variability, suggesting right atrial pressure of 15 mmHg.    06/18/21: TTE 1. There is turbulence in distal outflow tract which is contributing to  murmur (no significant gradiet). Left ventricular ejection fraction, by  estimation, is 45 to 50%. The left ventricle has mildly decreased  function. The left ventricle demonstrates  global hypokinesis. The left ventricular internal cavity size was mildly  to moderately dilated. There  is mild left ventricular hypertrophy of the  posterior segment. Left ventricular diastolic parameters are consistent  with Grade II diastolic dysfunction  (pseudonormalization).   2. Right ventricular systolic function is normal. The right ventricular  size is normal. There is normal pulmonary artery systolic pressure.   3. Left atrial size was severely dilated.   4. Right atrial size was mildly dilated.   5. The mitral valve is normal in structure. Moderate mitral valve  regurgitation. No evidence of mitral stenosis.   6. The aortic valve is tricuspid. There is moderate calcification of the  aortic valve. There is mild thickening of the aortic valve. Aortic valve  regurgitation is not visualized. Mild to moderate aortic valve  sclerosis/calcification is present, without  any evidence of aortic stenosis.   7. The inferior vena cava is dilated in size with >50% respiratory  variability, suggesting right atrial pressure of 8 mmHg.   Comparison(s): Prior images reviewed side by side.      09/22/2019: LHC Mid LM to Dist LM lesion is 20% stenosed. Previously placed Mid LAD stent (unknown type) is widely patent. Ramus lesion is 65% stenosed. The left ventricular systolic function is normal. LV  end diastolic pressure is moderately elevated. The left ventricular ejection fraction is 50-55% by visual estimate.   1. Nonobstructive CAD. The stent in the mid LAD is widely patent. There is modest ostial disease in a small ramus branch that is unchanged from 2017. 2. Mild LV dysfunction. EF 50%. 3. Elevated LVEDP 23 mm Hg.   Risk Assessment/Calculations:    Physical Exam:   VS:  LMP 02/14/1995    Wt Readings from Last 3 Encounters:  01/08/24 141 lb 8.6 oz (64.2 kg)  12/20/23 150 lb (68 kg)  12/16/23 161 lb 6 oz (73.2 kg)    GEN: Well nourished, well developed in no acute distress NECK: No JVD; No carotid bruits CARDIAC: RRR, no murmurs, rubs, gallops RESPIRATORY:  CTA b/l without  rales, wheezing or rhonchi  ABDOMEN: Soft, non-tender, non-distended EXTREMITIES:  she has wraps b/l LE, pedal edema is appreciated No deformity   ICD site: steri strips are removed without difficulty, wound edges are well approcimated no erythema, edema, heat No signs of infection Site is stable, no thinning, fluctuation, tethering  ASSESSMENT AND PLAN: .    ICD Well healed intact function no changes made   VT/VF Chronic amiodarone   Labs are UTD No recurrent VT  CAD No symptoms C/w Dr. Lazaro Prime  CM > described as NICM HFmrEF Discussed PRN demadex /K+ BID for 3-4 days for pedal edema,  C/w Dr. Lazaro Prime   6.   Paroxysmal AFib (Acute PE May 2025) CHA2DS2Vasc is 8, on Eliquis , appropriately dosed zero % burden   7.  Secondary hypercoagulable state   Dispo: back in 2 mo, sooner if needed, she would like to see Dr. Carolynne Citron (sounds like after he retores they discussed Dr. Jolan Natal to take over her care.  Signed, Debbie Fails, PA-C

## 2024-01-20 ENCOUNTER — Encounter: Payer: Self-pay | Admitting: Physician Assistant

## 2024-01-20 ENCOUNTER — Ambulatory Visit: Attending: Physician Assistant | Admitting: Physician Assistant

## 2024-01-20 VITALS — BP 124/70 | HR 68 | Ht 61.0 in | Wt 143.0 lb

## 2024-01-20 DIAGNOSIS — I480 Paroxysmal atrial fibrillation: Secondary | ICD-10-CM | POA: Insufficient documentation

## 2024-01-20 DIAGNOSIS — I4901 Ventricular fibrillation: Secondary | ICD-10-CM | POA: Insufficient documentation

## 2024-01-20 DIAGNOSIS — Z5189 Encounter for other specified aftercare: Secondary | ICD-10-CM | POA: Insufficient documentation

## 2024-01-20 DIAGNOSIS — I2510 Atherosclerotic heart disease of native coronary artery without angina pectoris: Secondary | ICD-10-CM | POA: Diagnosis present

## 2024-01-20 DIAGNOSIS — I428 Other cardiomyopathies: Secondary | ICD-10-CM | POA: Diagnosis present

## 2024-01-20 DIAGNOSIS — Z95810 Presence of automatic (implantable) cardiac defibrillator: Secondary | ICD-10-CM | POA: Insufficient documentation

## 2024-01-20 LAB — CUP PACEART INCLINIC DEVICE CHECK
Date Time Interrogation Session: 20250612145455
Implantable Lead Connection Status: 753985
Implantable Lead Connection Status: 753985
Implantable Lead Connection Status: 753985
Implantable Lead Implant Date: 20170612
Implantable Lead Implant Date: 20170612
Implantable Lead Implant Date: 20170612
Implantable Lead Location: 753858
Implantable Lead Location: 753859
Implantable Lead Location: 753860
Implantable Lead Model: 4398
Implantable Lead Model: 5076
Implantable Pulse Generator Implant Date: 20250530

## 2024-01-20 MED ORDER — AMIODARONE HCL 200 MG PO TABS: 200.0000 mg | ORAL_TABLET | Freq: Every day | ORAL | 3 refills | Status: AC

## 2024-01-20 NOTE — Patient Instructions (Signed)
 Medication Instructions:   Your physician recommends that you continue on your current medications as directed. Please refer to the Current Medication list given to you today.    you need a refill on your cardiac medications before your next appointment, please call your pharmacy*   Lab Work: NONE ORDERED  TODAY    If you have labs (blood work) drawn today and your tests are completely normal, you will receive your results only by: MyChart Message (if you have MyChart) OR A paper copy in the mail If you have any lab test that is abnormal or we need to change your treatment, we will call you to review the results.    Testing/Procedures: NONE ORDERED  TODAY      Follow-Up: At [facility], you and your health needs are our priority.  As part of our continuing mission to provide you with exceptional heart care, our providers are all part of one team.  This team includes your primary Cardiologist (physician) and Advanced Practice Providers or APPs (Physician Assistants and Nurse Practitioners) who all work together to provide you with the care you need, when you need it.  Your next appointment:    2 month(s) ( CONTACT  CASSIE HALL/ ANGELINE HAMMER FOR EP SCHEDULING ISSUES )    Provider:    You may see Manya Sells, MD or one of the following Advanced Practice Providers on your designated Care Team:    We recommend signing up for the patient portal called MyChart.  Sign up information is provided on this After Visit Summary.  MyChart is used to connect with patients for Virtual Visits (Telemedicine).  Patients are able to view lab/test results, encounter notes, upcoming appointments, etc.  Non-urgent messages can be sent to your provider as well.   To learn more about what you can do with MyChart, go to [email or web address].   Other Instructions

## 2024-01-24 ENCOUNTER — Telehealth: Payer: Self-pay

## 2024-01-24 DIAGNOSIS — C9210 Chronic myeloid leukemia, BCR/ABL-positive, not having achieved remission: Secondary | ICD-10-CM

## 2024-01-27 ENCOUNTER — Telehealth: Payer: Self-pay | Admitting: Internal Medicine

## 2024-01-27 NOTE — Telephone Encounter (Signed)
 Forms signed and faxed to [phone].

## 2024-01-27 NOTE — Telephone Encounter (Signed)
 Pt c/o medication issue:  1. Name of Medication:   APIXABAN  (ELIQUIS ) VTE STARTER PACK (10MG  AND 5MG )    2. How are you currently taking this medication (dosage and times per day)?   3. Are you having a reaction (difficulty breathing--STAT)? no  4. What is your medication issue? Patient calling in to speak with the nurse about medication. Please advise

## 2024-01-27 NOTE — Telephone Encounter (Signed)
 I spoke with Dr Meridith Stanford nurse and we both had originally thought that Dr Avanell Bob had singed the papers out in [place] where I had sent them since she was there that day. However, Dr Carolynne Citron was the prescriber listed so the papers did not get signed... Crystal is going to place the papers back into the chart for Dr Avanell Bob to sign under her name and once I receive them I will reach out to Dr Avanell Bob at the hospital to see if I can fax ti her to get them signed asap for the pt to get her assistance.

## 2024-01-27 NOTE — Telephone Encounter (Signed)
 Blank provider part of her forms received and filled out with Dr Avanell Bob' prescribing information... will have her sign later today.

## 2024-01-27 NOTE — Telephone Encounter (Signed)
 Spoke with pt over the phone who was asking for help with PAP for Eliquis . PA completed originally in hospital but was sent without provider signature. Paperwork was scanned under media on 01/05/24. Will send to both Dr. Avanell Bob and Dr. Meridith Stanford nurses to review.  Pt also asking about Atorvastatin  (Lipitor) refill but no recent refills have been made since 2017 and that was under Dr. Bensimhon who the pt has not seen in the last 5 years per pt. Will send to Dr. Avanell Bob' nurse to review this as well.

## 2024-01-31 NOTE — Telephone Encounter (Signed)
 Form has been sent to med Rec to be scanned in will wait for it to be scanned so I can reprint it and send back in.

## 2024-01-31 NOTE — Telephone Encounter (Signed)
 Called to check status- needs income verification and providers portion refaxed due to the signature not being eligible

## 2024-02-01 ENCOUNTER — Telehealth: Payer: Self-pay

## 2024-02-01 NOTE — Telephone Encounter (Signed)
 Spoke with [place] about paperwork we received. Paperwork had wrong Dr listed on paper to sign. Person I spoke with said she would reach out to the nurse and make sure these were orders for Dr Waddell to sign and would fix the paper work or send them to the correct doctor. Asked to send with Attention to [facility] office so they could have Dr Waddell look them over and sign as he will not be in this office until 02/18/24. Will forward to [facility] triage as a heads up for the paperwork.

## 2024-02-01 NOTE — Telephone Encounter (Signed)
 Form still in med rec and has not been scanned in yet... I refilled it out... and stamped it with the hopes that it will be accepted since I will not see Dr Okey fro several days. Form has now been refaxed in Dr Okey' mailbox.

## 2024-02-01 NOTE — Telephone Encounter (Signed)
 Noted

## 2024-02-02 ENCOUNTER — Telehealth: Payer: Self-pay | Admitting: *Deleted

## 2024-02-02 NOTE — Telephone Encounter (Signed)
 Patient is dropping off her income verification to the [place] location today

## 2024-02-02 NOTE — Progress Notes (Signed)
 Complex Care Management Note  Care Guide Note 02/02/2024 Name: Lisa [name] MRN: [id number] DOB: 04/03/1941  Lisa [name] is a 83 y.o. year old female who sees Claudene Pellet, MD for primary care. I reached out to Lisa [name] by phone today to offer complex care management services.  Lisa [name] was given information about Complex Care Management services today including:   The Complex Care Management services include support from the care team which includes your Nurse Care Manager, Clinical Social Worker, or Pharmacist.  The Complex Care Management team is here to help remove barriers to the health concerns and goals most important to you. Complex Care Management services are voluntary, and the patient may decline or stop services at any time by request to their care team member.   Complex Care Management Consent Status: Patient did not agree to participate in complex care management services at this time.  Follow up plan: None  Encounter Outcome:  Patient Refused Harlene Satterfield  [facility]  [facility] Guide  Direct Dial: [phone]  Fax [phone]

## 2024-02-02 NOTE — Telephone Encounter (Signed)
 Faxed over income verification

## 2024-02-03 NOTE — Telephone Encounter (Signed)
 PAP: Patient has been denied for patient assistance by [employer] ([employer]) due to see below:

## 2024-02-28 ENCOUNTER — Encounter: Payer: Self-pay | Admitting: Internal Medicine

## 2024-02-28 ENCOUNTER — Ambulatory Visit: Attending: Internal Medicine

## 2024-02-28 DIAGNOSIS — Z95810 Presence of automatic (implantable) cardiac defibrillator: Secondary | ICD-10-CM | POA: Diagnosis not present

## 2024-02-28 DIAGNOSIS — I5022 Chronic systolic (congestive) heart failure: Secondary | ICD-10-CM | POA: Diagnosis not present

## 2024-02-29 ENCOUNTER — Telehealth: Payer: Self-pay

## 2024-02-29 ENCOUNTER — Ambulatory Visit

## 2024-02-29 ENCOUNTER — Encounter: Payer: Self-pay | Admitting: Internal Medicine

## 2024-02-29 DIAGNOSIS — I255 Ischemic cardiomyopathy: Secondary | ICD-10-CM

## 2024-02-29 LAB — CUP PACEART REMOTE DEVICE CHECK
Battery Remaining Longevity: 124 mo
Battery Voltage: 3.1 V
Brady Statistic AP VP Percent: 2.89 %
Brady Statistic AP VS Percent: 0.21 %
Brady Statistic AS VP Percent: 95.04 %
Brady Statistic AS VS Percent: 1.86 %
Brady Statistic RA Percent Paced: 3.1 %
Brady Statistic RV Percent Paced: 0.1 %
Date Time Interrogation Session: 20250722031142
HighPow Impedance: 44 Ohm
Implantable Lead Connection Status: 753985
Implantable Lead Connection Status: 753985
Implantable Lead Connection Status: 753985
Implantable Lead Implant Date: 20170612
Implantable Lead Implant Date: 20170612
Implantable Lead Implant Date: 20170612
Implantable Lead Location: 753858
Implantable Lead Location: 753859
Implantable Lead Location: 753860
Implantable Lead Model: 4398
Implantable Lead Model: 5076
Implantable Pulse Generator Implant Date: 20250530
Lead Channel Impedance Value: 304 Ohm
Lead Channel Impedance Value: 323 Ohm
Lead Channel Impedance Value: 323 Ohm
Lead Channel Impedance Value: 380 Ohm
Lead Channel Impedance Value: 418 Ohm
Lead Channel Impedance Value: 437 Ohm
Lead Channel Impedance Value: 494 Ohm
Lead Channel Impedance Value: 589 Ohm
Lead Channel Impedance Value: 589 Ohm
Lead Channel Impedance Value: 684 Ohm
Lead Channel Impedance Value: 684 Ohm
Lead Channel Impedance Value: 703 Ohm
Lead Channel Impedance Value: 703 Ohm
Lead Channel Pacing Threshold Amplitude: 0.75 V
Lead Channel Pacing Threshold Amplitude: 1.125 V
Lead Channel Pacing Threshold Amplitude: 1.5 V
Lead Channel Pacing Threshold Pulse Width: 0.4 ms
Lead Channel Pacing Threshold Pulse Width: 0.4 ms
Lead Channel Pacing Threshold Pulse Width: 0.8 ms
Lead Channel Sensing Intrinsic Amplitude: 1.8 mV
Lead Channel Sensing Intrinsic Amplitude: 6.5 mV
Lead Channel Setting Pacing Amplitude: 1.5 V
Lead Channel Setting Pacing Amplitude: 2 V
Lead Channel Setting Pacing Amplitude: 2 V
Lead Channel Setting Pacing Pulse Width: 0.4 ms
Lead Channel Setting Pacing Pulse Width: 0.8 ms
Lead Channel Setting Sensing Sensitivity: 0.3 mV
Zone Setting Status: 755011

## 2024-02-29 NOTE — Telephone Encounter (Signed)
 Scheduled remote transmission received today and flagged by Mitzie Garner, RN.  1 VF episode 6/27 @ 01:00, 17sec in duration, HR 250, V>A, EMG c/w with VT/VF 1 burst of ATP followed by 35J of HV therapy converting arrhythmia.  LM on patient's VM to make her aware. ? Needs labs?  Currently has follow up scheduled with Dr. Waddell on 03/23/24.

## 2024-02-29 NOTE — Telephone Encounter (Signed)
 Spoke with patient. She does not recall any symptoms at time of event.  No changes other than she started a new chemo drug: scemblix.  Pharmacy is investigating if any interaction with her Amiodarone .  Patient says she is scheduled for blood work by her Oncology team tomorrow; however, when I called to see if they could add a METB and a Mag level, they said they did not have anything ordered for tomorrow until her next in office appointment.   Patient is scheduled to see Dr. Waddell on 03/23/24.  Forwarding for his review and if anything further is needed until she comes in.

## 2024-02-29 NOTE — Telephone Encounter (Signed)
-----   Message from Nurse Mitzie RAMAN sent at 02/29/2024  7:22 AM EDT ----- Regarding: Shock 7/22 Carelink report has 1 shock for VT/VF, 0 failed.  Would please review and follow up?  You may also get this from [employer] but not sure.    Thanks Mitzie

## 2024-03-01 ENCOUNTER — Ambulatory Visit: Payer: Self-pay | Admitting: Internal Medicine

## 2024-03-02 NOTE — Progress Notes (Signed)
 EPIC Encounter for ICM Monitoring  Patient Name: Lisa [name] is a 83 y.o. female Date: 03/02/2024 Primary Care Physican: Claudene Pellet, MD Primary Cardiologist: Okey Electrophysiologist: Waddell Pore Pacing: 97.9%         11/12/2021 Weight: 248 lbs 01/21/2022 Weight: 246 lbs 09/01/2022 Office Weight: 205 lbs 04/23/2023 Office Weight: 206 lbs 09/02/2023 Weight: 170 lbs 09/14/2023 Weight: 172 lbs 03/02/2024 Weight: 141.8 lbs  Since 20-Jan-2024 VT/VF Therapy Summary Pace-Terminated Episodes  0 of 1 Shock-Terminated Episodes 1 of 1 - Addressed on 7/22 by Lorn Fees, RN device clinic                                                    Spoke with patient and heart failure questions reviewed.  Transmission results reviewed.  Pt asymptomatic for fluid accumulation.  Reports feeling well at this time and voices no complaints.     Diet:  Reviews labels for salt contents and buys low salt foods.     Optivol thoracic impedance suggesting minimal amount possible fluid accumulation starting 7/11 and returned to baseline 7/22.    Prescribed:  Torsemide  20 mg take 1 tablet(s) (20 mg total) by mouth daily.   4/8 She reports Dr Waddell advised, at 3/24 OV, she can take an extra Torsemide  about 1-2 times a week if needed.     Labs: 08/21/2023 Creatinine 2.27, BUN 29, Potassium 4.2, Sodium 135, GFR 21  08/20/2023 Creatinine 2.21, BUN 28, Potassium 3.9, Sodium 137, GFR 22  08/19/2023 Creatinine 2.18, BUN 26, Potassium 3.7, Sodium 134, GFR 22  08/18/2023 Creatinine 2.00, BUN 22, Potassium 4.3, Sodium 135, GFR 25 08/17/2023 Creatinine 1.80, BUN 22, Potassium 3.4, Sodium 136, GFR 28  A complete set of results can be found in Results Review.   Recommendations:  No changes and encouraged to call if experiencing any fluid symptoms.   Follow-up plan: ICM clinic phone appointment on 04/03/2024.   91 day device clinic remote transmission 05/30/2024.     EP/Cardiology Office Visits:  03/23/2024 with Dr  Waddell.  9/2 with Charlies Arthur, PA.   Copy of ICM check sent to Dr. Waddell.    3 month ICM trend: 02/29/2024.    12-14 Month ICM trend:     Mitzie GORMAN Garner, RN 03/02/2024 12:26 PM

## 2024-03-06 ENCOUNTER — Telehealth: Payer: Self-pay | Admitting: Pharmacist

## 2024-03-06 ENCOUNTER — Telehealth: Payer: Self-pay | Admitting: Internal Medicine

## 2024-03-06 ENCOUNTER — Encounter: Payer: Self-pay | Admitting: Internal Medicine

## 2024-03-06 NOTE — Telephone Encounter (Signed)
 Patient would like to switch from Eliquis  to pradaxa . She sent in a MyChart message about this last week and is following up.

## 2024-03-06 NOTE — Telephone Encounter (Signed)
 OK   She would be 76 bid dosing given age, renal function

## 2024-03-06 NOTE — Telephone Encounter (Signed)
 Patient called stating she want to speak to one of Dr. Adrian nurse, wouldn't go into further detail when asked.

## 2024-03-06 NOTE — Telephone Encounter (Signed)
 Informed her that she can be switched to Dabigatrin (Pradaxa ) 75 mg bid per Dr Okey  She said she is trying to figure out what pharmacy she wants to use. She will send a MyChart message to let us  know where to send the prescription to.

## 2024-03-06 NOTE — Telephone Encounter (Signed)
 Patient started SCEMBLIX  due to drug interaction with atorvastatin  her oncologist changed statin to pitavastatin 4 mg. F/u lab is due on Aug 22,2025. Apt with [facility] lipid clinic scheduled on Sept 15. At 11:30.

## 2024-03-07 ENCOUNTER — Encounter: Payer: Self-pay | Admitting: Internal Medicine

## 2024-03-07 ENCOUNTER — Other Ambulatory Visit: Payer: Self-pay

## 2024-03-07 MED ORDER — DABIGATRAN ETEXILATE MESYLATE 75 MG PO CAPS: 75.0000 mg | ORAL_CAPSULE | Freq: Two times a day (BID) | ORAL | 1 refills | Status: AC

## 2024-03-07 MED ORDER — DABIGATRAN ETEXILATE MESYLATE 75 MG PO CAPS: 75.0000 mg | ORAL_CAPSULE | Freq: Two times a day (BID) | ORAL | 3 refills | Status: DC

## 2024-03-07 NOTE — Telephone Encounter (Signed)
 Prescription refill request for Pradaxa  received.  Indication:afib Last office visit:5/25 Weight:64.9  kg Age:83 Scr:1.84  7/25 CrCl:24.15  ml/min  Prescription refilled

## 2024-03-07 NOTE — Telephone Encounter (Deleted)
 Prescription refill request for Eliquis  received. Indication:afib Last office visit:5/25 Scr:1.84  7/25 Age: 83 Weight:64.9  kg  Prescription refilled

## 2024-03-08 ENCOUNTER — Other Ambulatory Visit: Payer: Self-pay | Admitting: Internal Medicine

## 2024-03-10 ENCOUNTER — Telehealth: Payer: Self-pay | Admitting: Internal Medicine

## 2024-03-10 NOTE — Telephone Encounter (Signed)
 Prescription was sent to [facility] on 03/07/24.

## 2024-03-10 NOTE — Telephone Encounter (Signed)
 Pt c/o medication issue:  1. Name of Medication:   torsemide  (DEMADEX ) 20 MG tablet    2. How are you currently taking this medication (dosage and times per day)? Not sure   3. Are you having a reaction (difficulty breathing--STAT)? No   4. What is your medication issue? Pharmacy called in requesting clarification on this med. She states it states to take as directed but does not specify how often.

## 2024-03-13 ENCOUNTER — Encounter: Payer: Self-pay | Admitting: Internal Medicine

## 2024-03-13 MED ORDER — TORSEMIDE 20 MG PO TABS: 20.0000 mg | ORAL_TABLET | Freq: Every day | ORAL | 3 refills | Status: DC

## 2024-03-13 NOTE — Telephone Encounter (Signed)
 The last clinic visit in may 2025 the med list said she was taking demedex 20 mg   1x per day alternating with 2x per day   Please confirm that is what she is taking   OK to refill

## 2024-03-14 ENCOUNTER — Other Ambulatory Visit: Payer: Self-pay

## 2024-03-14 MED ORDER — TORSEMIDE 20 MG PO TABS: 20.0000 mg | ORAL_TABLET | Freq: Every day | ORAL | 3 refills | Status: AC

## 2024-03-14 NOTE — Telephone Encounter (Signed)
 Spoke to patient she stated she is taking Torsemide  20 mg daily.Refill sent to her pharmacy.

## 2024-03-23 ENCOUNTER — Telehealth: Payer: Self-pay | Admitting: Internal Medicine

## 2024-03-23 ENCOUNTER — Ambulatory Visit: Attending: Internal Medicine | Admitting: Internal Medicine

## 2024-03-23 ENCOUNTER — Encounter: Payer: Self-pay | Admitting: Internal Medicine

## 2024-03-23 VITALS — BP 127/78 | HR 78 | Ht 61.0 in | Wt 133.6 lb

## 2024-03-23 DIAGNOSIS — I472 Ventricular tachycardia, unspecified: Secondary | ICD-10-CM

## 2024-03-23 NOTE — Progress Notes (Signed)
 HPI Lisa [name] returns today for followup. She is a pleasant obese 83 yo woman with chronic systolic heart failure, PAF, and VT. She is s/p Biv ICD insertion. She remains on amiodarone  200 mg daily. She is fairly sedentary. No additional syncope or ICD therapies. 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 This is much improved. She has gotten a new leg wrap which appears to be helping some. She was in the hospital a few months ago with CHF. She had syncope. She had VT and a shock in her sleep back in June. Her weight is down by half over the last 2 years. Allergies  Allergen Reactions   Flagyl [Metronidazole] Itching, Rash and Other (See Comments)    Welts, also   Penicillins Hives, Itching and Rash    Previously tolerated ancef  + rocephin    Sulfamethoxazole Rash   Dilaudid  [Hydromorphone  Hcl] Nausea And Vomiting   Meperidine Nausea And Vomiting   Propoxyphene     Other reaction(s): Unknown   Tape Other (See Comments)    THE PLASTIC, CLEAR TAPE CAN/DOES PULL OFF MY SKIN.   Doxycycline  Nausea Only, Rash and Other (See Comments)    Made her feel terrible   Lanolin Itching and Rash   Sulfa Antibiotics Rash     Current Outpatient Medications  Medication Sig Dispense Refill   acetaminophen  (TYLENOL ) 500 MG tablet Take 1,000 mg by mouth every 6 (six) hours as needed for moderate pain (pain score 4-6).     allopurinol  (ZYLOPRIM ) 100 MG tablet Take 100 mg by mouth daily.      amiodarone  (PACERONE ) 200 MG tablet Take 1 tablet (200 mg total) by mouth at bedtime. 90 tablet 3   aspirin  EC 81 MG tablet Take 81 mg by mouth in the morning.     atorvastatin  (LIPITOR) 20 MG tablet Take 1 tablet (20 mg total) by mouth daily. 90 tablet 3   calcium  carbonate (OS-CAL - DOSED IN MG OF ELEMENTAL CALCIUM ) 1250 (500 Ca) MG tablet Take 1 tablet by mouth daily as needed (Calcium  levels).     Cholecalciferol  (VITAMIN D -3 PO) Take 5,000 Units by mouth daily.     Cyanocobalamin  (VITAMIN B12 SL) Place 1 tablet under the tongue daily.     dabigatran  (PRADAXA ) 75 MG CAPS capsule Take 1 capsule (75 mg total) by mouth 2 (two) times daily. 180 capsule 1   folic acid  (FOLVITE ) 1 MG tablet Take 1 tablet (1 mg total) by mouth daily. 30 tablet 2   MAGNESIUM  GLYCINATE PO Take 1 tablet by mouth at bedtime.     nitroGLYCERIN  (NITROSTAT ) 0.4 MG SL tablet Place 1 tablet (0.4 mg total) under the tongue every 5 (five) minutes x 3 doses as needed for chest pain. 25 tablet 0   potassium chloride  (KLOR-CON  M) 10 MEQ tablet Take 1 tablet (10 mEq total) by mouth as needed. (Patient taking differently: Take 10 mEq by mouth daily.) 180 tablet 3   thyroid  (ARMOUR) 15 MG tablet Take 15 mg by mouth daily before breakfast.      torsemide  (DEMADEX ) 20 MG tablet Take 1 tablet (20 mg total) by mouth daily. 90 tablet 3   No current facility-administered medications for this visit.     Past Medical History:  Diagnosis Date   Acute on chronic systolic heart failure (HCC)    Acute renal failure superimposed on stage 3b chronic kidney disease (HCC) 05/22/2022   Bacteremia due to Pseudomonas 06/16/2022  CAD in native artery 2017   stent to LAD   Cardiac arrest (HCC) 09/2015   CHF (congestive heart failure) (HCC)    CML (chronic myelocytic leukemia) (HCC)    Complication of anesthesia    Hx: UTI (urinary tract infection)    Hypertension    Melanoma (HCC)    PONV (postoperative nausea and vomiting)    PVC (premature ventricular contraction)    HISTORY OF   Thyroid  disease    V tach (HCC)     ROS:   All systems reviewed and negative except as noted in the HPI.   Past Surgical History:  Procedure Laterality Date   ABDOMINAL HYSTERECTOMY     APPENDECTOMY     CARDIAC CATHETERIZATION N/A 10/03/2015   Procedure: Left Heart Cath and Coronary Angiography;  Surgeon: Peter M Swaziland, MD;  Location[facility]VASIVE CV LAB;  Service: Cardiovascular;  Laterality: N/A;   CARDIAC CATHETERIZATION N/A  10/07/2015   Procedure: Coronary Stent Intervention;  Surgeon: Lonni JONETTA Cash, MD;  Location: [facility] INVASIVE CV LAB;  Service: Cardiovascular;  Laterality: N/A;   CARDIAC CATHETERIZATION  09/22/2019   EP IMPLANTABLE DEVICE N/A 01/20/2016   Procedure: BiV ICD Insertion CRT-D;  Surgeon: Danelle LELON Birmingham, MD;  Location: [facility] INVASIVE CV LAB;  Service: Cardiovascular;  Laterality: N/A;   ICD GENERATOR CHANGEOUT N/A 01/07/2024   Procedure: ICD GENERATOR CHANGEOUT;  Surgeon: Birmingham Danelle LELON, MD;  Location: [facility] INVASIVE CV LAB;  Service: Cardiovascular;  Laterality: N/A;   IR THORACENTESIS ASP PLEURAL SPACE W/IMG GUIDE  07/20/2023   IR THORACENTESIS ASP PLEURAL SPACE W/IMG GUIDE  12/02/2023   LEFT HEART CATH AND CORONARY ANGIOGRAPHY N/A 09/22/2019   Procedure: LEFT HEART CATH AND CORONARY ANGIOGRAPHY;  Surgeon: Swaziland, Peter M, MD;  Location: [facility] INVASIVE CV LAB;  Service: Cardiovascular;  Laterality: N/A;   TONSILLECTOMY     TONSILLECTOMY     TUBAL LIGATION       Family History  Problem Relation Age of Onset   Coronary artery disease Mother    Thyroid  disease Mother    Multiple myeloma Father    Heart attack Maternal Grandfather    Heart attack Paternal Grandfather      Social History   Socioeconomic History   Marital status: Divorced    Spouse name: Not on file   Number of children: Not on file   Years of education: Not on file   Highest education level: Not on file  Occupational History   Not on file  Tobacco Use   Smoking status: Never   Smokeless tobacco: Never  Vaping Use   Vaping status: Never Used  Substance and Sexual Activity   Alcohol use: No    Alcohol/week: 0.0 standard drinks of alcohol   Drug use: No   Sexual activity: Not on file  Other Topics Concern   Not on file  Social History Narrative   Not on file   Social Drivers of Health   Financial Resource Strain: Low Risk  (10/28/2022)   Received from [facility]   Overall Financial Resource Strain (CARDIA)     Difficulty of Paying Living Expenses: Not hard at all  Food Insecurity: No Food Insecurity (12/29/2023)   Hunger Vital Sign    Worried About Running Out of Food in the Last Year: Never true    Ran Out of Food in the Last Year: Never true  Transportation Needs: No Transportation Needs (12/29/2023)   PRAPARE - Transportation    Lack of Transportation (  Medical): No    Lack of Transportation (Non-Medical): No  Physical Activity: Not on file  Stress: Not on file  Social Connections: Unknown (12/29/2023)   Social Connection and Isolation Panel    Frequency of Communication with Friends and Family: More than three times a week    Frequency of Social Gatherings with Friends and Family: Twice a week    Attends Religious Services: Never    Database administrator or Organizations: Yes    Attends Engineer, structural: More than 4 times per year    Marital Status: Patient declined  Recent Concern: Social Connections - Moderately Isolated (12/07/2023)   Social Connection and Isolation Panel    Frequency of Communication with Friends and Family: More than three times a week    Frequency of Social Gatherings with Friends and Family: Twice a week    Attends Religious Services: Never    Database administrator or Organizations: Yes    Attends Engineer, structural: More than 4 times per year    Marital Status: Divorced  Catering manager Violence: Not At Risk (12/29/2023)   Humiliation, Afraid, Rape, and Kick questionnaire    Fear of Current or Ex-Partner: No    Emotionally Abused: No    Physically Abused: No    Sexually Abused: No     BP 127/78   Pulse 78   Ht 5' 1 (1.549 m)   Wt 133 lb 9.6 oz (60.6 kg)   LMP 02/14/1995   SpO2 95%   BMI 25.24 kg/m   Physical Exam:  Well appearing NAD HEENT: Unremarkable Neck:  No JVD, no thyromegally Lymphatics:  No adenopathy Back:  No CVA tenderness Lungs:  Clear HEART:  Regular rate rhythm, no murmurs, no rubs, no clicks Abd:  soft,  positive bowel sounds, no organomegally, no rebound, no guarding Ext:  2 plus pulses, no edema, no cyanosis, no clubbing Skin:  No rashes no nodules Neuro:  CN II through XII intact, motor grossly intact  DEVICE  Normal device function.  See PaceArt for details.   Assess/Plan:  Chronic systolic/diastolic heart failure - Her fluid status is improved. Continue current meds. ICD - she has undergone gen change out and healed up nicely.  Obesity - she continues to slowly lose weight. VT - she has not had more VT since June. Continue amiodarone .   Danelle Waddell COME

## 2024-03-23 NOTE — Telephone Encounter (Signed)
 Spoke with Wanda from [facility]. She stated the pt has 1 order that is outstanding. Apolinar stated she had faxed it over 2 months ago and has faxed it over to us  again today. Wanda is using the correct fax number for our office. Apolinar was told her message would be sent to Dr. Waddell and his nurse for their assistance. Apolinar verbalized understanding. All questions if any were answered.

## 2024-03-23 NOTE — Telephone Encounter (Signed)
 Calling to orders for the patient. Stated they faxed it over but they are going to refax it. Needs the dr to signed it. Please advise

## 2024-03-23 NOTE — Patient Instructions (Addendum)
 Medication Instructions:  Your physician recommends that you continue on your current medications as directed. Please refer to the Current Medication list given to you today.  *If you need a refill on your cardiac medications before your next appointment, please call your pharmacy*  Lab Work: None ordered.  You may go to any [facility] Location for your lab work:  [facility] - [id number] ([facility]) - [address] [phone] [address]  [place] - [address]   [place]  - [address]   [place] - [address] - [address] [employer]  - [address] ([facility]   If you have labs (blood work) drawn today and your tests are completely normal, you will receive your results only by: [employer] (if you have MyChart)  If you have any lab test that is abnormal or we need to change your treatment, we will call you or send a MyChart message to review the results.  Testing/Procedures: None ordered.  Follow-Up: At [facility], you and your health needs are our priority.  As part of our continuing mission to provide you with exceptional heart care, we have created designated Provider Care Teams.  These Care Teams include your primary Cardiologist (physician) and Advanced Practice Providers (APPs -  Physician Assistants and Nurse Practitioners) who all work together to provide you with the care you need, when you need it.  We recommend signing up for the patient portal called MyChart.  Sign up information is provided on this After Visit Summary.  MyChart is used to connect with patients for Virtual Visits (Telemedicine).  Patients are able to view lab/test results, encounter notes, upcoming appointments, etc.  Non-urgent messages can be sent to your provider as well.   To learn more about what you can do with MyChart, go to  [email or web address].    Your next appointment:   Feb 2026  The format for your next appointment:   In Person  Provider:   Donnice Primus, MD or one of the following Advanced Practice Providers on your designated Care Team:   Charlies Arthur, [place] Ozell Jodie Passey, [place] Leotis Barrack, NP  Note: Remote monitoring is used to monitor your Pacemaker/ ICD from home. This monitoring reduces the number of office visits required to check your device to one time per year. It allows us  to keep an eye on the functioning of your device to ensure it is working properly.

## 2024-03-24 NOTE — Telephone Encounter (Signed)
 Paperwork resent to both [phone] and [phone] again, With the attn to Apolinar Bars stating that if Dr Waddell is to sign this paperwork it needs to say his name and not that Dr Perri needs to sign it. See phone note for 02/01/2024

## 2024-04-03 ENCOUNTER — Ambulatory Visit: Attending: Internal Medicine

## 2024-04-03 DIAGNOSIS — I5022 Chronic systolic (congestive) heart failure: Secondary | ICD-10-CM | POA: Diagnosis not present

## 2024-04-03 DIAGNOSIS — Z95810 Presence of automatic (implantable) cardiac defibrillator: Secondary | ICD-10-CM

## 2024-04-06 ENCOUNTER — Telehealth: Payer: Self-pay | Admitting: Internal Medicine

## 2024-04-06 NOTE — Progress Notes (Signed)
 EPIC Encounter for ICM Monitoring  Patient Name: Lisa [name] is a 83 y.o. female Date: 04/06/2024 Primary Care Physican: Claudene Pellet, MD Primary Cardiologist: Okey Electrophysiologist: Waddell Pore Pacing: 97.5%         11/12/2021 Weight: 248 lbs 01/21/2022 Weight: 246 lbs 09/01/2022 Office Weight: 205 lbs 04/23/2023 Office Weight: 206 lbs 09/02/2023 Weight: 170 lbs 09/14/2023 Weight: 172 lbs 03/02/2024 Weight: 141.8 lbs  04/06/2024 Weight: 131.4 lbs                                                  Spoke with patient and heart failure questions reviewed.  Transmission results reviewed.  Pt asymptomatic for fluid accumulation.  Reports feeling well at this time and voices no complaints.     Diet:  Reviews labels for salt contents and buys low salt foods.     Optivol thoracic impedance suggesting minimal amount possible fluid accumulation starting 7/11 and returned to baseline 7/22.    Prescribed:  Torsemide  20 mg take 1 tablet(s) (20 mg total) by mouth daily.   4/8 She reports Dr Waddell advised, at 3/24 OV, she can take an extra Torsemide  about 1-2 times a week if needed.     Labs: 03/02/2024 Creatinine 1.84, BUN 26, Potassium 4.2, Sodium 133, GFR 27  01/08/2024 Creatinine 1.47, BUN 18, Potassium 4.4, Sodium 134, GFR 36  01/07/2024 Creatinine 1.43, BUN 19, Potassium 4.4, Sodium 134, GFR 37  01/06/2024 Creatinine 1.54, BUN 21, Potassium 3.3, Sodium 137, GFR 34 01/05/2024 Creatinine 1.54, BUN 23, Potassium 3.8, Sodium 134, GFR 34  A complete set of results can be found in Results Review.   Recommendations:  No changes and encouraged to call if experiencing any fluid symptoms.   Follow-up plan: ICM clinic phone appointment on 05/08/2024.   91 day device clinic remote transmission 05/30/2024.     EP/Cardiology Office Visits:  Recall 09/19/2024 with Dr Almetta.  06/09/2024 with Dr Okey.   Copy of ICM check sent to Dr. Waddell.    3 month ICM trend: 04/03/2024.    12-14 Month ICM  trend:     Mitzie GORMAN Garner, RN 04/06/2024 1:22 PM

## 2024-04-06 NOTE — Telephone Encounter (Signed)
 Gave to Dr Okey who will address forms and get those back to me.

## 2024-04-06 NOTE — Telephone Encounter (Signed)
 Pt dropped off [place] [employer] motor vehicles paperwork to be filled out by Dr. Waddell. Pt also included a envelope with 2 stamps for it to be mailed off.  Location; Dr. Taylors mailbox

## 2024-04-10 ENCOUNTER — Encounter

## 2024-04-11 ENCOUNTER — Encounter: Admitting: Physician Assistant

## 2024-04-13 ENCOUNTER — Other Ambulatory Visit (HOSPITAL_COMMUNITY): Payer: Self-pay

## 2024-04-13 DIAGNOSIS — Z0279 Encounter for issue of other medical certificate: Secondary | ICD-10-CM

## 2024-04-13 NOTE — Telephone Encounter (Signed)
 Patient came in today and signed the release of information and paid the form fee of $29.  The completed DMV form was faxed to [facility] DMV and mailed per patient.  I scanned copy into chart.  Billing notified.

## 2024-04-17 ENCOUNTER — Encounter: Admitting: Physician Assistant

## 2024-04-25 ENCOUNTER — Encounter: Payer: Self-pay | Admitting: Internal Medicine

## 2024-04-25 NOTE — Telephone Encounter (Signed)
 Pt requesting a c/b in regards to DMV forms.

## 2024-04-26 ENCOUNTER — Encounter: Payer: Self-pay | Admitting: Internal Medicine

## 2024-04-26 NOTE — Telephone Encounter (Signed)
 Apolinar with [facility] calling for an update on this request.

## 2024-04-26 NOTE — Telephone Encounter (Signed)
 DMV forms completed and faxed 04/13/24. Uploaded to chart under Media tab.

## 2024-04-26 NOTE — Telephone Encounter (Signed)
Left message with answering service

## 2024-04-27 NOTE — Telephone Encounter (Signed)
 Spoke with the patient who states that she needs to know what her DMV form said about whether or not she can drive. Advised that form said that she could return to driving 89/79/74.

## 2024-05-02 ENCOUNTER — Telehealth: Payer: Self-pay | Admitting: Internal Medicine

## 2024-05-02 NOTE — Telephone Encounter (Signed)
 Spoke to Joanna at [facility].  She reports this is from June of this year.  Aware I do not see any paperwork in MD's basket.  Aware his nurse will return to the office tomorrow and will follow up on this and let them know.   Apolinar appreciates my call/inquiry.

## 2024-05-02 NOTE — Telephone Encounter (Signed)
 Apolinar from [place] [facility] calling to follow up on Home Health Certification Plan of Care order number [id number] faxed on 04/26/24 and again today 05/02/24. She is asking Dr. Waddell please sign the orders and fax them back to Fax: [phone]. This order is now two months out of compliance and urgent it be sent back soon.  Please advise.

## 2024-05-08 ENCOUNTER — Ambulatory Visit: Attending: Internal Medicine

## 2024-05-08 DIAGNOSIS — I5022 Chronic systolic (congestive) heart failure: Secondary | ICD-10-CM

## 2024-05-08 DIAGNOSIS — Z95810 Presence of automatic (implantable) cardiac defibrillator: Secondary | ICD-10-CM | POA: Diagnosis not present

## 2024-05-11 ENCOUNTER — Telehealth: Payer: Self-pay

## 2024-05-11 ENCOUNTER — Ambulatory Visit: Attending: Family Medicine | Admitting: Physical Therapy

## 2024-05-11 ENCOUNTER — Other Ambulatory Visit: Payer: Self-pay

## 2024-05-11 ENCOUNTER — Encounter: Payer: Self-pay | Admitting: Physical Therapy

## 2024-05-11 DIAGNOSIS — M5459 Other low back pain: Secondary | ICD-10-CM | POA: Insufficient documentation

## 2024-05-11 DIAGNOSIS — M6281 Muscle weakness (generalized): Secondary | ICD-10-CM | POA: Insufficient documentation

## 2024-05-11 DIAGNOSIS — R262 Difficulty in walking, not elsewhere classified: Secondary | ICD-10-CM | POA: Insufficient documentation

## 2024-05-11 NOTE — Telephone Encounter (Signed)
 Remote ICM transmission received.  Attempted call to patient regarding ICM remote transmission.  Left detailed message per DPR with ICM phone number to return call for any questions, concerns or fluid symptoms.

## 2024-05-11 NOTE — Progress Notes (Signed)
 EPIC Encounter for ICM Monitoring  Patient Name: Lisa [name] is a 83 y.o. female Date: 05/11/2024 Primary Care Physican: Claudene Pellet, MD Primary Cardiologist: Okey Electrophysiologist: Waddell Pore Pacing: 97.9%         11/12/2021 Weight: 248 lbs 01/21/2022 Weight: 246 lbs 09/01/2022 Office Weight: 205 lbs 04/23/2023 Office Weight: 206 lbs 09/02/2023 Weight: 170 lbs 09/14/2023 Weight: 172 lbs 03/02/2024 Weight: 141.8 lbs  04/06/2024 Weight: 131.4 lbs                                                  Attempted call to patient and unable to reach.  Left detailed message per DPR regarding transmission.  Transmission results reviewed.    Diet:  Reviews labels for salt contents and buys low salt foods.     Since 04/03/2024 ICM Remote Transmission: Optivol thoracic impedance suggesting normal fluid levels.    Prescribed:  Torsemide  20 mg take 1 tablet(s) (20 mg total) by mouth daily.   11/16/2023 She reports Dr Waddell advised, at 11/01/2023 OV, she can take an extra Torsemide  about 1-2 times a week if needed.     Labs: 03/02/2024 Creatinine 1.84, BUN 26, Potassium 4.2, Sodium 133, GFR 27  01/08/2024 Creatinine 1.47, BUN 18, Potassium 4.4, Sodium 134, GFR 36  01/07/2024 Creatinine 1.43, BUN 19, Potassium 4.4, Sodium 134, GFR 37  01/06/2024 Creatinine 1.54, BUN 21, Potassium 3.3, Sodium 137, GFR 34 01/05/2024 Creatinine 1.54, BUN 23, Potassium 3.8, Sodium 134, GFR 34  A complete set of results can be found in Results Review.   Recommendations:  Left voice mail with ICM number and encouraged to call if experiencing any fluid symptoms.   Follow-up plan: ICM clinic phone appointment on 06/19/2024.   91 day device clinic remote transmission 05/30/2024.     EP/Cardiology Office Visits:  Recall 09/19/2024 with Dr Almetta.  06/09/2024 with Dr Okey.   Copy of ICM check sent to Dr. Waddell.    Remote monitoring is medically necessary for Heart Failure Management.    90 day Daily Thoracic Impedance  ICM trend: 02/06/2024 through 05/07/2024.    12-14 Month Thoracic Impedance ICM trend:     Mitzie GORMAN Garner, RN 05/11/2024 8:39 AM

## 2024-05-11 NOTE — Therapy (Signed)
 OUTPATIENT PHYSICAL THERAPY THORACOLUMBAR EVALUATION   Patient Name: Lisa [name] MRN: [id number] DOB:1941/01/28, 83 y.o., female Today's Date: 05/11/2024  END OF SESSION:  PT End of Session - 05/11/24 1535     Visit Number 1    Date for Recertification  07/06/24    Authorization Type Medicare    Progress Note Due on Visit 10    PT Start Time 1531    PT Stop Time 1615    PT Time Calculation (min) 44 min    Activity Tolerance Patient tolerated treatment well          Past Medical History:  Diagnosis Date   Acute on chronic systolic heart failure (HCC)    Acute renal failure superimposed on stage 3b chronic kidney disease (HCC) 05/22/2022   Bacteremia due to Pseudomonas 06/16/2022   CAD in native artery 2017   stent to LAD   Cardiac arrest (HCC) 09/2015   CHF (congestive heart failure) (HCC)    CML (chronic myelocytic leukemia) (HCC)    Complication of anesthesia    Hx: UTI (urinary tract infection)    Hypertension    Melanoma (HCC)    PONV (postoperative nausea and vomiting)    PVC (premature ventricular contraction)    HISTORY OF   Thyroid  disease    V tach (HCC)    Past Surgical History:  Procedure Laterality Date   ABDOMINAL HYSTERECTOMY     APPENDECTOMY     CARDIAC CATHETERIZATION N/A 10/03/2015   Procedure: Left Heart Cath and Coronary Angiography;  Surgeon: Peter M Swaziland, MD;  Location: [facility] INVASIVE CV LAB;  Service: Cardiovascular;  Laterality: N/A;   CARDIAC CATHETERIZATION N/A 10/07/2015   Procedure: Coronary Stent Intervention;  Surgeon: Lonni JONETTA Cash, MD;  Location: [facility] INVASIVE CV LAB;  Service: Cardiovascular;  Laterality: N/A;   CARDIAC CATHETERIZATION  09/22/2019   EP IMPLANTABLE DEVICE N/A 01/20/2016   Procedure: BiV ICD Insertion CRT-D;  Surgeon: Danelle LELON Birmingham, MD;  Location: [facility] INVASIVE CV LAB;  Service: Cardiovascular;  Laterality: N/A;   ICD GENERATOR CHANGEOUT N/A 01/07/2024   Procedure: ICD GENERATOR CHANGEOUT;  Surgeon: Birmingham Danelle LELON, MD;  Location: [facility] INVASIVE CV LAB;  Service: Cardiovascular;  Laterality: N/A;   IR THORACENTESIS ASP PLEURAL SPACE W/IMG GUIDE  07/20/2023   IR THORACENTESIS ASP PLEURAL SPACE W/IMG GUIDE  12/02/2023   LEFT HEART CATH AND CORONARY ANGIOGRAPHY N/A 09/22/2019   Procedure: LEFT HEART CATH AND CORONARY ANGIOGRAPHY;  Surgeon: Swaziland, Peter M, MD;  Location: [facility] INVASIVE CV LAB;  Service: Cardiovascular;  Laterality: N/A;   TONSILLECTOMY     TONSILLECTOMY     TUBAL LIGATION     Patient Active Problem List   Diagnosis Date Noted   ICD (implantable cardioverter-defibrillator) discharge 12/29/2023   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   Need for management of chest tube 12/06/2023   Hydropneumothorax 12/04/2023   Obesity, class 1 08/20/2023   Fall at home, initial encounter 08/09/2023   Generalized weakness 08/09/2023   Elevated troponin 08/09/2023   Anemia of chronic disease 08/09/2023   Acute on chronic diastolic heart failure (HCC) 08/08/2023   Cellulitis, leg 07/17/2023   Open wound of right lower extremity 07/17/2023   Respiratory distress 07/17/2023   Recurrent left pleural effusion 07/17/2023   Heart failure with reduced ejection fraction (HCC) 07/17/2023   Chronic kidney disease, stage III (moderate) (HCC) 07/17/2023   Obesity (BMI 30-39.9) 07/17/2023   Urine culture positive 06/19/2022   Transaminitis 06/17/2022   Viral gastroenteritis 06/16/2022  Venous stasis dermatitis of both lower extremities 05/26/2022   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 05/22/2022   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/21/2022   Paroxysmal ventricular tachycardia (HCC) 09/21/2019   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   Unstable angina (HCC) 10/07/2015   Chronic systolic heart failure (HCC)    PVC's (premature ventricular contractions)    Ventricular fibrillation (HCC) 10/03/2015   History of cardiac arrest 10/03/2015   Essential hypertension 09/09/2015   Lymphedema  02/21/2015   Acquired hypothyroidism 02/21/2015    PCP: Claudene Pellet MD  REFERRING PROVIDER: Claudene Pellet MD  REFERRING DIAG: M54.50 LBP  Rationale for Evaluation and Treatment: Rehabilitation  THERAPY DIAG:  Other low back pain  Muscle weakness (generalized)  Difficulty in walking, not elsewhere classified  ONSET DATE: approx 1 year  SUBJECTIVE:                                                                                                                                                                                           SUBJECTIVE STATEMENT: LBP about 1 year for no apparent reason; worsened last several months; weakness following hospitalization and then 2 falls in July may have contributed to increased pain; uses 4 wheeled rollator full time at home and in community Can walk  2 minutes comfortably but needs to sit some then  Don't vacuum not b/c of pain but decreased stamina  PERTINENT HISTORY:  Leukemia oral chemo Pacemaker/debrillator CHF HTN Hospitalized in May for 1 week, fluid  2 falls in July: turning off light and tipped over on side;  the other fall was after got out of shower and turned and sat straight down (had to get son and caregiver helped get off the floor) caregiver 2x/week, alone 4 days/week HHPT 4-5 weeks walking with 4 WW, bands (red) for legs and arms  Blood clot in leg Lymphedema   PAIN:   Are you having pain? Yes NPRS scale: 0/10 with sitting; night time really bad  Pain location: bil lumbar Pain orientation: Bilateral  PAIN TYPE: aching Pain description: intermittent  Aggravating factors: night, early AM, lying down  Relieving factors: sleep with heating pad; Tylenol , Blue Emu, Tiger Balm  PRECAUTIONS: ICD/Pacemaker    WEIGHT BEARING RESTRICTIONS: fall  FALLS:  Has patient fallen in last 6 months? Yes. Number of falls 2  LIVING ENVIRONMENT: Lives with: lives alone Lives in: House/apartment Stairs: No, 3 steps to get  in the door (getting better) Has following equipment at home: Vannie - 4 wheeled, shower chair  OCCUPATION: Psychologist, counselling, real estate, currently volunteer for [employer]  PLOF: Independent with basic ADLs  PATIENT  GOALS: want to have hydrotherapy; go as far as I can in mobility; walk without help but I'm too off balance; be more flexible; get in car easier without hands assisting legs; energy to walk around grocery store  NEXT MD VISIT: as needed  OBJECTIVE:  Note: Objective measures were completed at Evaluation unless otherwise noted.  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  2. Personal care (washing, dressing, etc.) 0 =  I can take care of myself normally without causing increased pain.  3. Lifting 3 = Pain prevents me from lifting heavy weights, but I can manage (5) I have hardly any social life because of my pain. light to medium weights if they are conveniently positioned  4. Walking 3 =  Pain prevents me from walking more than  mile.  5. Sitting 1 =  I can only sit in my favorite chair as long as I like.  6. Standing 3 =  Pain prevents me from standing more than 1/2 hour.  7. Sleeping 1 = I can sleep well only by using pain medication.  8. Social Life 1 =  My social life is normal, but it increases my level of pain.  9. Traveling 1 =  I can travel anywhere, but it increases my pain.  10. Employment/ Homemaking 2 = I can perform most of my homemaking/job duties, but pain prevents me from performing more physically stressful activities (eg, lifting, vacuuming).  Total 34%   Interpretation of scores: Score Category Description  0-20% Minimal Disability The patient can cope with most living activities. Usually no treatment is  indicated apart from advice on lifting, sitting and exercise  21-40% Moderate Disability The patient experiences more pain and difficulty with sitting, lifting and standing. Travel and social life are more difficult and they may be disabled from work. Personal care, sexual activity and sleeping are not grossly affected, and the patient can usually be managed by conservative means  41-60% Severe Disability Pain remains the main problem in this group, but activities of daily living are affected. These patients require a detailed investigation  61-80% Crippled Back pain impinges on all aspects of the patient's life. Positive intervention is required  81-100% Bed-bound  These patients are either bed-bound or exaggerating their symptoms  Bluford FORBES Zoe DELENA Karon DELENA, et al. Surgery versus conservative management of stable thoracolumbar fracture: the PRESTO feasibility RCT. [place] ([place]): [employer]; 2021 Nov. [facility] Technology Assessment, No. 25.62.) Appendix 3, Oswestry Disability Index category descriptors. Available from: [employer]Jewel[employer]cz  Minimally Clinically Important Difference (MCID) = 12.8%  COGNITION: Overall cognitive status: Within functional limits for tasks assessed     MUSCLE LENGTH: Decreased HS and hip flexor lengths bil POSTURE: tends to stand forward flexed slightly, reports decreased back pain with standing erect  LUMBAR ROM:   AROM eval  Flexion 50  Extension 10  Right lateral flexion   Left lateral flexion   Right rotation   Left rotation    (Blank rows = not tested)  TRUNK STRENGTH:  Decreased activation of transverse abdominus muscles; abdominals 4-/5; decreased activation of lumbar multifidi; trunk extensors 4-/5   LOWER EXTREMITY ROM:   lacks full knee extension bil;  hip flexion 70 degrees, hip extension 5 degrees, unable to  achieve seated figure 4 (external rotation)  LOWER EXTREMITY MMT:  unable to rise from a  standard height chair without significant UE assist; unable to SLS without heavy UE assist  MMT Right eval Left eval  Hip flexion 4- 4-  Hip extension 4- 4-  Hip abduction 3+ 3+  Hip adduction    Hip internal rotation    Hip external rotation    Knee flexion 4- 4-  Knee extension 3+ 3+  Ankle dorsiflexion 4- 4-  Ankle plantarflexion 4- 4-  Ankle inversion    Ankle eversion     (Blank rows = not tested)   FUNCTIONAL TESTS:  5x STS 15.43 sec shortness of breath, low back pain TUG with 4 WW hands assist 24.63 sec back pain < 5 2 MWT 222 feet RPE 5-6/10  O2 98%; strain feeling in back   GAIT: Comments: decreased gait speed, using 4 WW  TREATMENT DATE: 05/11/24 evaluation                                                                                                                                PATIENT EDUCATION:  Education details: Educated patient on anatomy and physiology of current symptoms, prognosis, plan of care as well as initial self care strategies to promote recovery Person educated: Patient Education method: Explanation Education comprehension: verbalized understanding  HOME EXERCISE PROGRAM: To be started  ASSESSMENT:  CLINICAL IMPRESSION: Patient is a 83 y.o. female who was seen today for physical therapy evaluation and treatment for low back pain.  She also complains of generalized weakness and decreased balance with 2 recent falls.  LE and lumbar stiffness and lack of strength contribute to difficulty getting in/out of the car.  She complains of increased pain at night time with lying down and in the mornings.  Decreased gait speed and difficulty with sit to stand also indicate patient is at a higher risk of falls.  The patient would benefit from PT to address trunk and hip range of motion deficits, strength asymmetries in lumbo/pelvic and hip regions and pain levels that are currently affecting activities of daily living at home including standing, sleeping,  walking, performing housework and volunteer work.     OBJECTIVE IMPAIRMENTS: decreased activity tolerance, decreased balance, decreased mobility, difficulty walking, decreased ROM, decreased strength, impaired perceived functional ability, impaired flexibility, and pain.   ACTIVITY LIMITATIONS: carrying, lifting, bending, standing, squatting, sleeping, stairs, transfers, hygiene/grooming, and locomotion level  PARTICIPATION LIMITATIONS: meal prep, cleaning, laundry, driving, shopping, and community activity  PERSONAL FACTORS: Time since onset of injury/illness/exacerbation and 3+ comorbidities: cardiac history, leukemia, lymphedema, HTN are also affecting patient's functional outcome.   REHAB POTENTIAL: Good  CLINICAL DECISION MAKING: Evolving/moderate complexity  EVALUATION COMPLEXITY: Moderate   GOALS: Goals reviewed with patient? Yes  SHORT TERM GOALS: Target date: 06/08/2024    The patient will demonstrate knowledge of basic self care strategies and exercises to promote healing  Baseline: Goal status:  INITIAL  2.  The patient will report a 30% improvement in pain levels with functional activities which are currently difficult including night time and in the mornings Baseline:  Goal status: INITIAL  3.  The patient will have improved Timed Up and Go (TUG) time to    20 sec   sec indicating improved gait speed and LE strength  Baseline:  Goal status: INITIAL  4.  The patient will be able to ambulate 350 feet in 3 minutes needed for short community walking, medical appts Baseline:  Goal status: INITIAL  5.  Improved LE strength improved as indicated by 5x STS test to 14 sec with min UE assist Baseline:  Goal status: INITIAL    LONG TERM GOALS: Target date: 07/06/2024    The patient will be independent in a safe self progression of a home exercise program to promote further recovery of function  Baseline:  Goal status: INITIAL  2.  The patient will report a 30%  improvement in pain levels with functional activities which are currently difficult including night time and in the mornings Baseline:  Goal status: INITIAL  3.  The patient will have improved gait stamina and speed needed to ambulate 600 feet in 6 minutes  Baseline:  Goal status: INITIAL  4.  The patient will have improved Timed Up and Go (TUG) time to   17   sec indicating improved gait speed, decreased risk of falls and LE strength  Baseline:  Goal status: INITIAL  5.  Modified Oswestry functional outcome measure score improved to   26  % indicating improved function with ADLS with less pain.   Baseline:  Goal status: INITIAL   PLAN:  PT FREQUENCY: 2x/week  PT DURATION: 8 weeks  PLANNED INTERVENTIONS: 97164- PT Re-evaluation, 97750- Physical Performance Testing, 97110-Therapeutic exercises, 97530- Therapeutic activity, [id number]- Neuromuscular re-education, 97535- Self Care, [id number]- Manual therapy, [phone]- Aquatic Therapy, [phone] (1-2 muscles), [id number] (3+ muscles)- Dry Needling, Patient/Family education, Balance training, Stair training, Taping, Joint mobilization, Cryotherapy, and Moist heat.  PLAN FOR NEXT SESSION: aquatic PT we discussed the distance upon entry to the building and walking requirement and the patient feels she could sit on her walker seat to rest. She also feels she can enter and exit the pool steps using the handrail; do BERG; LE strengthening; trunk strengthening;stretch HS and hip flexors No ES (pacemaker/defibrillator  Glade Pesa, PT 05/11/24 10:37 PM Phone: [phone] Fax: [phone]

## 2024-05-12 NOTE — Telephone Encounter (Signed)
*  There are multiple encounters open on this matter.  I have called and faxed since 02/01/24. The paperwork faxed in continues to state Dr Perri needs to sign the papers. Dr Waddell can not sign papers for another provider at another office. If Dr Waddell is to sign paperwork the actually paperwork should be addressed to him not just the cover letter.*

## 2024-05-12 NOTE — Progress Notes (Signed)
 Remote ICD Transmission

## 2024-05-19 ENCOUNTER — Encounter: Payer: Self-pay | Admitting: Physical Therapy

## 2024-05-19 ENCOUNTER — Ambulatory Visit: Payer: Self-pay | Admitting: Physical Therapy

## 2024-05-19 DIAGNOSIS — M5459 Other low back pain: Secondary | ICD-10-CM

## 2024-05-19 DIAGNOSIS — M6281 Muscle weakness (generalized): Secondary | ICD-10-CM

## 2024-05-19 DIAGNOSIS — R262 Difficulty in walking, not elsewhere classified: Secondary | ICD-10-CM

## 2024-05-19 NOTE — Therapy (Signed)
 OUTPATIENT PHYSICAL THERAPY THORACOLUMBAR PROGRESS NOTE   Patient Name: Lisa [name] MRN: [id number] DOB:12-04-1940, 83 y.o., female Today's Date: 05/19/2024  END OF SESSION:  PT End of Session - 05/19/24 1257     Visit Number 2    Date for Recertification  07/06/24    Authorization Type Medicare    Progress Note Due on Visit 10    PT Start Time 1250    PT Stop Time 1330    PT Time Calculation (min) 40 min    Activity Tolerance Patient tolerated treatment well    Behavior During Therapy [facility] for tasks assessed/performed           Past Medical History:  Diagnosis Date   Acute on chronic systolic heart failure (HCC)    Acute renal failure superimposed on stage 3b chronic kidney disease (HCC) 05/22/2022   Bacteremia due to Pseudomonas 06/16/2022   CAD in native artery 2017   stent to LAD   Cardiac arrest (HCC) 09/2015   CHF (congestive heart failure) (HCC)    CML (chronic myelocytic leukemia) (HCC)    Complication of anesthesia    Hx: UTI (urinary tract infection)    Hypertension    Melanoma (HCC)    PONV (postoperative nausea and vomiting)    PVC (premature ventricular contraction)    HISTORY OF   Thyroid  disease    V tach (HCC)    Past Surgical History:  Procedure Laterality Date   ABDOMINAL HYSTERECTOMY     APPENDECTOMY     CARDIAC CATHETERIZATION N/A 10/03/2015   Procedure: Left Heart Cath and Coronary Angiography;  Surgeon: Peter M Swaziland, MD;  Location: [facility] INVASIVE CV LAB;  Service: Cardiovascular;  Laterality: N/A;   CARDIAC CATHETERIZATION N/A 10/07/2015   Procedure: Coronary Stent Intervention;  Surgeon: Lonni JONETTA Cash, MD;  Location: [facility] INVASIVE CV LAB;  Service: Cardiovascular;  Laterality: N/A;   CARDIAC CATHETERIZATION  09/22/2019   EP IMPLANTABLE DEVICE N/A 01/20/2016   Procedure: BiV ICD Insertion CRT-D;  Surgeon: Danelle LELON Birmingham, MD;  Location: [facility] INVASIVE CV LAB;  Service: Cardiovascular;  Laterality: N/A;   ICD GENERATOR CHANGEOUT N/A  01/07/2024   Procedure: ICD GENERATOR CHANGEOUT;  Surgeon: Birmingham Danelle LELON, MD;  Location: [place] INVASIVE CV LAB;  Service: Cardiovascular;  Laterality: N/A;   IR THORACENTESIS ASP PLEURAL SPACE W/IMG GUIDE  07/20/2023   IR THORACENTESIS ASP PLEURAL SPACE W/IMG GUIDE  12/02/2023   LEFT HEART CATH AND CORONARY ANGIOGRAPHY N/A 09/22/2019   Procedure: LEFT HEART CATH AND CORONARY ANGIOGRAPHY;  Surgeon: Swaziland, Peter M, MD;  Location: [facility] INVASIVE CV LAB;  Service: Cardiovascular;  Laterality: N/A;   TONSILLECTOMY     TONSILLECTOMY     TUBAL LIGATION     Patient Active Problem List   Diagnosis Date Noted   ICD (implantable cardioverter-defibrillator) discharge 12/29/2023   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   Need for management of chest tube 12/06/2023   Hydropneumothorax 12/04/2023   Obesity, class 1 08/20/2023   Fall at home, initial encounter 08/09/2023   Generalized weakness 08/09/2023   Elevated troponin 08/09/2023   Anemia of chronic disease 08/09/2023   Acute on chronic diastolic heart failure (HCC) 08/08/2023   Cellulitis, leg 07/17/2023   Open wound of right lower extremity 07/17/2023   Respiratory distress 07/17/2023   Recurrent left pleural effusion 07/17/2023   Heart failure with reduced ejection fraction (HCC) 07/17/2023   Chronic kidney disease, stage III (moderate) (HCC) 07/17/2023   Obesity (BMI 30-39.9) 07/17/2023   Urine  culture positive 06/19/2022   Transaminitis 06/17/2022   Viral gastroenteritis 06/16/2022   Venous stasis dermatitis of both lower extremities 05/26/2022   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 05/22/2022   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/21/2022   Paroxysmal ventricular tachycardia (HCC) 09/21/2019   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   Unstable angina (HCC) 10/07/2015   Chronic systolic heart failure (HCC)    PVC's (premature ventricular contractions)    Ventricular fibrillation (HCC) 10/03/2015   History of cardiac  arrest 10/03/2015   Essential hypertension 09/09/2015   Lymphedema 02/21/2015   Acquired hypothyroidism 02/21/2015    PCP: Claudene Pellet MD  REFERRING PROVIDER: Claudene Pellet MD  REFERRING DIAG: M54.50 LBP  Rationale for Evaluation and Treatment: Rehabilitation  THERAPY DIAG:  Other low back pain  Muscle weakness (generalized)  Difficulty in walking, not elsewhere classified  ONSET DATE: approx 1 year  SUBJECTIVE:                                                                                                                                                                                           SUBJECTIVE STATEMENT: I did the long walk! Lt back/hip is where I hurt today. I want to try the stairs to get into the pool because I have 3 steps to get into my house.   PERTINENT HISTORY:  Leukemia oral chemo Pacemaker/debrillator CHF HTN Hospitalized in May for 1 week, fluid  2 falls in July: turning off light and tipped over on side;  the other fall was after got out of shower and turned and sat straight down (had to get son and caregiver helped get off the floor) caregiver 2x/week, alone 4 days/week HHPT 4-5 weeks walking with 4 WW, bands (red) for legs and arms  Blood clot in leg Lymphedema   PAIN:   Are you having pain? Yes NPRS scale: 4/10 with sitting; night time really bad  Pain location: bil lumbar Pain orientation: Bilateral  PAIN TYPE: aching Pain description: intermittent  Aggravating factors: night, early AM, lying down  Relieving factors: sleep with heating pad; Tylenol , Blue Emu, Tiger Balm  PRECAUTIONS: ICD/Pacemaker    WEIGHT BEARING RESTRICTIONS: fall  FALLS:  Has patient fallen in last 6 months? Yes. Number of falls 2  LIVING ENVIRONMENT: Lives with: lives alone Lives in: House/apartment Stairs: No, 3 steps to get in the door (getting better) Has following equipment at home: Vannie - 4 wheeled, shower chair  OCCUPATION: Psychologist, counselling,  real estate, currently volunteer for [employer]  PLOF: Independent with basic ADLs  PATIENT GOALS: want to have hydrotherapy; go as far as I can in  mobility; walk without help but I'm too off balance; be more flexible; get in car easier without hands assisting legs; energy to walk around grocery store  NEXT MD VISIT: as needed  OBJECTIVE:  Note: Objective measures were completed at Evaluation unless otherwise noted.  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  2. Personal care (washing, dressing, etc.) 0 =  I can take care of myself normally without causing increased pain.  3. Lifting 3 = Pain prevents me from lifting heavy weights, but I can manage (5) I have hardly any social life because of my pain. light to medium weights if they are conveniently positioned  4. Walking 3 =  Pain prevents me from walking more than  mile.  5. Sitting 1 =  I can only sit in my favorite chair as long as I like.  6. Standing 3 =  Pain prevents me from standing more than 1/2 hour.  7. Sleeping 1 = I can sleep well only by using pain medication.  8. Social Life 1 =  My social life is normal, but it increases my level of pain.  9. Traveling 1 =  I can travel anywhere, but it increases my pain.  10. Employment/ Homemaking 2 = I can perform most of my homemaking/job duties, but pain prevents me from performing more physically stressful activities (eg, lifting, vacuuming).  Total 34%   Interpretation of scores: Score Category Description  0-20% Minimal Disability The patient can cope with most living activities. Usually no treatment is indicated apart from advice on lifting, sitting and exercise  21-40% Moderate Disability The patient experiences more pain and  difficulty with sitting, lifting and standing. Travel and social life are more difficult and they may be disabled from work. Personal care, sexual activity and sleeping are not grossly affected, and the patient can usually be managed by conservative means  41-60% Severe Disability Pain remains the main problem in this group, but activities of daily living are affected. These patients require a detailed investigation  61-80% Crippled Back pain impinges on all aspects of the patient's life. Positive intervention is required  81-100% Bed-bound  These patients are either bed-bound or exaggerating their symptoms  Bluford FORBES Zoe DELENA Karon DELENA, et al. Surgery versus conservative management of stable thoracolumbar fracture: the PRESTO feasibility RCT. [place]): [employer]; 2021 Nov. [facility] Technology Assessment, No. 25.62.) Appendix 3, Oswestry Disability Index category descriptors. Available from: FindJewel[employer]cz  Minimally Clinically Important Difference (MCID) = 12.8%  COGNITION: Overall cognitive status: Within functional limits for tasks assessed     MUSCLE LENGTH: Decreased HS and hip flexor lengths bil POSTURE: tends to stand forward flexed slightly, reports decreased back pain with standing erect  LUMBAR ROM:   AROM eval  Flexion 50  Extension 10  Right lateral flexion   Left lateral flexion   Right rotation   Left rotation    (Blank rows = not tested)  TRUNK STRENGTH:  Decreased activation of transverse abdominus muscles; abdominals 4-/5; decreased activation of lumbar multifidi; trunk extensors 4-/5   LOWER EXTREMITY ROM:   lacks full knee extension bil;  hip flexion 70 degrees, hip extension 5 degrees, unable to achieve seated figure 4 (external rotation)  LOWER EXTREMITY MMT:  unable  to rise from a standard height chair without significant UE assist; unable to SLS without heavy UE assist  MMT Right eval Left eval  Hip flexion  4- 4-  Hip extension 4- 4-  Hip abduction 3+ 3+  Hip adduction    Hip internal rotation    Hip external rotation    Knee flexion 4- 4-  Knee extension 3+ 3+  Ankle dorsiflexion 4- 4-  Ankle plantarflexion 4- 4-  Ankle inversion    Ankle eversion     (Blank rows = not tested)   FUNCTIONAL TESTS:  5x STS 15.43 sec shortness of breath, low back pain TUG with 4 WW hands assist 24.63 sec back pain < 5 2 MWT 222 feet RPE 5-6/10  O2 98%; strain feeling in back   GAIT: Comments: decreased gait speed, using 4 WW  TREATMENT DATE:  05/19/24: Pt arrives for aquatic physical therapy. Treatment took place in 3.5-5.5 feet of water . Water  temperature was 91 degrees F. Pt entered the pool via stairs with extra time and heavy use of rails. Pt requires buoyancy of water  for support and to offload joints with strengthening exercises.  Seated at bench: VC to safely get into seated position. Allowed pt 2 min to relax, focus on breathing naturally/slowly in order to settle into the water  especially at her hips. Once settled: Knee kicks 1 min 2x, ankle pumps Bil 2x20, marching 15x,  Standing at half wall (near where she was sitting) heel raises 10x, side steps Bil 10x, step back 10x each leg, mini squats 10x. All holding onto wall for balance.  Holding yellow noodle: walk from half wall to other side of the water  bench: sit to rest about 1 min, then walk to the stairs. Back stretch holding the rails: hold 5 sec 4x. Close SBA to exit pool at stairs, pt did not want the lift chair.     05/11/24 evaluation                                                                                                                                PATIENT EDUCATION:  Education details: Educated patient on anatomy and physiology of current symptoms, prognosis, plan of care as well as initial self care strategies to promote recovery Person educated: Patient Education method: Explanation Education comprehension: verbalized  understanding  HOME EXERCISE PROGRAM: To be started  ASSESSMENT:  CLINICAL IMPRESSION:Pt arrives for her first aquatic PT treatment with mild LT low back and hip pain. Efforts made for pt to feel safe in the water . Pt did well with extra time for all transitions. Pt did report exercises would pull or stretch her LT back but was clear this was not pain. Pt was verbally educated in water  principles and what she may feel later today or tomorrow.    OBJECTIVE IMPAIRMENTS: decreased activity tolerance, decreased balance, decreased mobility, difficulty walking, decreased ROM, decreased strength, impaired perceived functional ability, impaired flexibility, and pain.  ACTIVITY LIMITATIONS: carrying, lifting, bending, standing, squatting, sleeping, stairs, transfers, hygiene/grooming, and locomotion level  PARTICIPATION LIMITATIONS: meal prep, cleaning, laundry, driving, shopping, and community activity  PERSONAL FACTORS: Time since onset of injury/illness/exacerbation and 3+ comorbidities: cardiac history, leukemia, lymphedema, HTN are also affecting patient's functional outcome.   REHAB POTENTIAL: Good  CLINICAL DECISION MAKING: Evolving/moderate complexity  EVALUATION COMPLEXITY: Moderate   GOALS: Goals reviewed with patient? Yes  SHORT TERM GOALS: Target date: 06/08/2024    The patient will demonstrate knowledge of basic self care strategies and exercises to promote healing  Baseline: Goal status: INITIAL  2.  The patient will report a 30% improvement in pain levels with functional activities which are currently difficult including night time and in the mornings Baseline:  Goal status: INITIAL  3.  The patient will have improved Timed Up and Go (TUG) time to    20 sec   sec indicating improved gait speed and LE strength  Baseline:  Goal status: INITIAL  4.  The patient will be able to ambulate 350 feet in 3 minutes needed for short community walking, medical appts Baseline:   Goal status: INITIAL  5.  Improved LE strength improved as indicated by 5x STS test to 14 sec with min UE assist Baseline:  Goal status: INITIAL    LONG TERM GOALS: Target date: 07/06/2024    The patient will be independent in a safe self progression of a home exercise program to promote further recovery of function  Baseline:  Goal status: INITIAL  2.  The patient will report a 30% improvement in pain levels with functional activities which are currently difficult including night time and in the mornings Baseline:  Goal status: INITIAL  3.  The patient will have improved gait stamina and speed needed to ambulate 600 feet in 6 minutes  Baseline:  Goal status: INITIAL  4.  The patient will have improved Timed Up and Go (TUG) time to   17   sec indicating improved gait speed, decreased risk of falls and LE strength  Baseline:  Goal status: INITIAL  5.  Modified Oswestry functional outcome measure score improved to   26  % indicating improved function with ADLS with less pain.   Baseline:  Goal status: INITIAL   PLAN:  PT FREQUENCY: 2x/week  PT DURATION: 8 weeks  PLANNED INTERVENTIONS: 97164- PT Re-evaluation, 97750- Physical Performance Testing, 97110-Therapeutic exercises, 97530- Therapeutic activity, [id number]- Neuromuscular re-education, 97535- Self Care, [id number]- Manual therapy, [phone]- Aquatic Therapy, [phone] (1-2 muscles), 20561 (3+ muscles)- Dry Needling, Patient/Family education, Balance training, Stair training, Taping, Joint mobilization, Cryotherapy, and Moist heat.  PLAN FOR NEXT SESSION: Assess first aquatic session, land: do BERG; LE strengthening; trunk strengthening;stretch HS and hip flexors No ES (pacemaker/defibrillator)  Delon Darner, PTA 05/19/24 1:23 PM   Phone: [phone] Fax: [phone]

## 2024-05-23 ENCOUNTER — Telehealth: Payer: Self-pay | Admitting: Physical Therapy

## 2024-05-23 NOTE — Telephone Encounter (Signed)
 Called and spoke with Lisa [name].  She reports after the initial eval, despite it being easy movements, she was in a lot of pain and had difficulty getting up the steps at home.  She did not have this issue with her aquatic PT visit.  She requests to only do aquatic PT at this time.  Will follow up at the 10th visit in the clinic to reassess.

## 2024-05-23 NOTE — Telephone Encounter (Signed)
 Returned her phone call; left message on voicemail

## 2024-05-26 ENCOUNTER — Encounter: Payer: Self-pay | Admitting: Physical Therapy

## 2024-05-26 ENCOUNTER — Ambulatory Visit: Admitting: Physical Therapy

## 2024-05-26 DIAGNOSIS — M5459 Other low back pain: Secondary | ICD-10-CM

## 2024-05-26 DIAGNOSIS — R262 Difficulty in walking, not elsewhere classified: Secondary | ICD-10-CM

## 2024-05-26 DIAGNOSIS — M6281 Muscle weakness (generalized): Secondary | ICD-10-CM

## 2024-05-26 NOTE — Therapy (Signed)
 OUTPATIENT PHYSICAL THERAPY THORACOLUMBAR PROGRESS NOTE   Patient Name: Lisa [name] MRN: [id number] DOB:Oct 20, 1940, 83 y.o., female Today's Date: 05/26/2024  END OF SESSION:  PT End of Session - 05/26/24 1437     Visit Number 3    Date for Recertification  07/06/24    Authorization Type Medicare    Progress Note Due on Visit 10    PT Start Time 1430    PT Stop Time 1508    PT Time Calculation (min) 38 min    Activity Tolerance Patient tolerated treatment well    Behavior During Therapy [facility] for tasks assessed/performed            Past Medical History:  Diagnosis Date   Acute on chronic systolic heart failure (HCC)    Acute renal failure superimposed on stage 3b chronic kidney disease (HCC) 05/22/2022   Bacteremia due to Pseudomonas 06/16/2022   CAD in native artery 2017   stent to LAD   Cardiac arrest (HCC) 09/2015   CHF (congestive heart failure) (HCC)    CML (chronic myelocytic leukemia) (HCC)    Complication of anesthesia    Hx: UTI (urinary tract infection)    Hypertension    Melanoma (HCC)    PONV (postoperative nausea and vomiting)    PVC (premature ventricular contraction)    HISTORY OF   Thyroid  disease    V tach (HCC)    Past Surgical History:  Procedure Laterality Date   ABDOMINAL HYSTERECTOMY     APPENDECTOMY     CARDIAC CATHETERIZATION N/A 10/03/2015   Procedure: Left Heart Cath and Coronary Angiography;  Surgeon: Peter M Swaziland, MD;  Location: [facility] INVASIVE CV LAB;  Service: Cardiovascular;  Laterality: N/A;   CARDIAC CATHETERIZATION N/A 10/07/2015   Procedure: Coronary Stent Intervention;  Surgeon: Lonni JONETTA Cash, MD;  Location: [facility] INVASIVE CV LAB;  Service: Cardiovascular;  Laterality: N/A;   CARDIAC CATHETERIZATION  09/22/2019   EP IMPLANTABLE DEVICE N/A 01/20/2016   Procedure: BiV ICD Insertion CRT-D;  Surgeon: Danelle LELON Birmingham, MD;  Location: [facility] INVASIVE CV LAB;  Service: Cardiovascular;  Laterality: N/A;   ICD GENERATOR CHANGEOUT N/A  01/07/2024   Procedure: ICD GENERATOR CHANGEOUT;  Surgeon: Birmingham Danelle LELON, MD;  Location: [facility] INVASIVE CV LAB;  Service: Cardiovascular;  Laterality: N/A;   IR THORACENTESIS ASP PLEURAL SPACE W/IMG GUIDE  07/20/2023   IR THORACENTESIS ASP PLEURAL SPACE W/IMG GUIDE  12/02/2023   LEFT HEART CATH AND CORONARY ANGIOGRAPHY N/A 09/22/2019   Procedure: LEFT HEART CATH AND CORONARY ANGIOGRAPHY;  Surgeon: Swaziland, Peter M, MD;  Location: [facility] INVASIVE CV LAB;  Service: Cardiovascular;  Laterality: N/A;   TONSILLECTOMY     TONSILLECTOMY     TUBAL LIGATION     Patient Active Problem List   Diagnosis Date Noted   ICD (implantable cardioverter-defibrillator) discharge 12/29/2023   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   Need for management of chest tube 12/06/2023   Hydropneumothorax 12/04/2023   Obesity, class 1 08/20/2023   Fall at home, initial encounter 08/09/2023   Generalized weakness 08/09/2023   Elevated troponin 08/09/2023   Anemia of chronic disease 08/09/2023   Acute on chronic diastolic heart failure (HCC) 08/08/2023   Cellulitis, leg 07/17/2023   Open wound of right lower extremity 07/17/2023   Respiratory distress 07/17/2023   Recurrent left pleural effusion 07/17/2023   Heart failure with reduced ejection fraction (HCC) 07/17/2023   Chronic kidney disease, stage III (moderate) (HCC) 07/17/2023   Obesity (BMI 30-39.9) 07/17/2023  Urine culture positive 06/19/2022   Transaminitis 06/17/2022   Viral gastroenteritis 06/16/2022   Venous stasis dermatitis of both lower extremities 05/26/2022   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 05/22/2022   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/21/2022   Paroxysmal ventricular tachycardia (HCC) 09/21/2019   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   Unstable angina (HCC) 10/07/2015   Chronic systolic heart failure (HCC)    PVC's (premature ventricular contractions)    Ventricular fibrillation (HCC) 10/03/2015   History of cardiac  arrest 10/03/2015   Essential hypertension 09/09/2015   Lymphedema 02/21/2015   Acquired hypothyroidism 02/21/2015    PCP: Claudene Pellet MD  REFERRING PROVIDER: Claudene Pellet MD  REFERRING DIAG: M54.50 LBP  Rationale for Evaluation and Treatment: Rehabilitation  THERAPY DIAG:  Other low back pain  Muscle weakness (generalized)  Difficulty in walking, not elsewhere classified  ONSET DATE: approx 1 year  SUBJECTIVE:                                                                                                                                                                                           SUBJECTIVE STATEMENT: I did not hurt as much after the water  like I did after land. I am going to do pool exercises only for a while and hopefully build up to do land exercises better. I called and talked with Stacy.  PERTINENT HISTORY:  Leukemia oral chemo Pacemaker/debrillator CHF HTN Hospitalized in May for 1 week, fluid  2 falls in July: turning off light and tipped over on side;  the other fall was after got out of shower and turned and sat straight down (had to get son and caregiver helped get off the floor) caregiver 2x/week, alone 4 days/week HHPT 4-5 weeks walking with 4 WW, bands (red) for legs and arms  Blood clot in leg Lymphedema   PAIN:   Are you having pain? Yes NPRS scale: 4/10 with sitting; night time really bad  Pain location: bil lumbar Pain orientation: Bilateral  PAIN TYPE: aching Pain description: intermittent  Aggravating factors: night, early AM, lying down  Relieving factors: sleep with heating pad; Tylenol , Blue Emu, Tiger Balm  PRECAUTIONS: ICD/Pacemaker    WEIGHT BEARING RESTRICTIONS: fall  FALLS:  Has patient fallen in last 6 months? Yes. Number of falls 2  LIVING ENVIRONMENT: Lives with: lives alone Lives in: House/apartment Stairs: No, 3 steps to get in the door (getting better) Has following equipment at home: Vannie - 4  wheeled, shower chair  OCCUPATION: Psychologist, counselling, real estate, currently volunteer for [employer]  PLOF: Independent with basic ADLs  PATIENT GOALS: want to have hydrotherapy;  go as far as I can in mobility; walk without help but I'm too off balance; be more flexible; get in car easier without hands assisting legs; energy to walk around grocery store  NEXT MD VISIT: as needed  OBJECTIVE:  Note: Objective measures were completed at Evaluation unless otherwise noted.  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  2. Personal care (washing, dressing, etc.) 0 =  I can take care of myself normally without causing increased pain.  3. Lifting 3 = Pain prevents me from lifting heavy weights, but I can manage (5) I have hardly any social life because of my pain. light to medium weights if they are conveniently positioned  4. Walking 3 =  Pain prevents me from walking more than  mile.  5. Sitting 1 =  I can only sit in my favorite chair as long as I like.  6. Standing 3 =  Pain prevents me from standing more than 1/2 hour.  7. Sleeping 1 = I can sleep well only by using pain medication.  8. Social Life 1 =  My social life is normal, but it increases my level of pain.  9. Traveling 1 =  I can travel anywhere, but it increases my pain.  10. Employment/ Homemaking 2 = I can perform most of my homemaking/job duties, but pain prevents me from performing more physically stressful activities (eg, lifting, vacuuming).  Total 34%   Interpretation of scores: Score Category Description  0-20% Minimal Disability The patient can cope with most living activities. Usually no treatment is indicated apart from advice on lifting, sitting and exercise  21-40% Moderate  Disability The patient experiences more pain and difficulty with sitting, lifting and standing. Travel and social life are more difficult and they may be disabled from work. Personal care, sexual activity and sleeping are not grossly affected, and the patient can usually be managed by conservative means  41-60% Severe Disability Pain remains the main problem in this group, but activities of daily living are affected. These patients require a detailed investigation  61-80% Crippled Back pain impinges on all aspects of the patient's life. Positive intervention is required  81-100% Bed-bound  These patients are either bed-bound or exaggerating their symptoms  Bluford FORBES Zoe DELENA Karon DELENA, et al. Surgery versus conservative management of stable thoracolumbar fracture: the PRESTO feasibility RCT. [place] ([place]): [employer]; 2021 Nov. [facility] Technology Assessment, No. 25.62.) Appendix 3, Oswestry Disability Index category descriptors. Available from: FindJewel[employer].cz  Minimally Clinically Important Difference (MCID) = 12.8%  COGNITION: Overall cognitive status: Within functional limits for tasks assessed     MUSCLE LENGTH: Decreased HS and hip flexor lengths bil POSTURE: tends to stand forward flexed slightly, reports decreased back pain with standing erect  LUMBAR ROM:   AROM eval  Flexion 50  Extension 10  Right lateral flexion   Left lateral flexion   Right rotation   Left rotation    (Blank rows = not tested)  TRUNK STRENGTH:  Decreased activation of transverse abdominus muscles; abdominals 4-/5; decreased activation of lumbar multifidi; trunk extensors 4-/5   LOWER EXTREMITY ROM:   lacks full knee extension bil;  hip flexion 70 degrees, hip extension 5 degrees, unable to achieve seated figure 4 (external  rotation)  LOWER EXTREMITY MMT:  unable to rise from a standard height chair without significant UE assist; unable to SLS without heavy UE  assist  MMT Right eval Left eval  Hip flexion 4- 4-  Hip extension 4- 4-  Hip abduction 3+ 3+  Hip adduction    Hip internal rotation    Hip external rotation    Knee flexion 4- 4-  Knee extension 3+ 3+  Ankle dorsiflexion 4- 4-  Ankle plantarflexion 4- 4-  Ankle inversion    Ankle eversion     (Blank rows = not tested)   FUNCTIONAL TESTS:  5x STS 15.43 sec shortness of breath, low back pain TUG with 4 WW hands assist 24.63 sec back pain < 5 2 MWT 222 feet RPE 5-6/10  O2 98%; strain feeling in back   GAIT: Comments: decreased gait speed, using 4 [employer]  TREATMENT DATE:   05/26/24:Pt arrives for aquatic physical therapy. Treatment took place in 3.5-5.5 feet of water . Water  temperature was 91 degrees F. Pt entered the pool via stairs with extra time and heavy use of rails. Pt requires buoyancy of water  for support and to offload joints with strengthening exercises.  Seated at bench: VC to safely get into seated position. Allowed pt 2 min to relax, focus on breathing naturally/slowly in order to settle into the water  especially at her hips. Once settled: Knee kicks 1 min 2x, ankle pumps Bil 2x20, marching 15x,  Standing at half wall (near where she was sitting) heel raises 10x, side steps Bil 10x, step back 10x each leg, mini squats 10x. All holding onto wall for balance.  Holding yellow noodle: walk from half wall to other side of the water  bench: sit to rest about 30 sec, stand up with yellow noodle and walk back to original seat at th ebench. She repeated this 2x. Sit to stand at bench holding yellow noodle 10x. Rest  Back stretch holding the rails: hold 5 sec 4x. Close SBA to exit pool at stairs, pt did not want the lift chair.      05/19/24: Pt arrives for aquatic physical therapy. Treatment took place in 3.5-5.5 feet of water . Water  temperature was 91 degrees F. Pt entered the pool via stairs with extra time and heavy use of rails. Pt requires buoyancy of water  for support and to  offload joints with strengthening exercises.  Seated at bench: VC to safely get into seated position. Allowed pt 2 min to relax, focus on breathing naturally/slowly in order to settle into the water  especially at her hips. Once settled: Knee kicks 1 min 2x, ankle pumps Bil 2x20, marching 15x,  Standing at half wall (near where she was sitting) heel raises 10x, side steps Bil 10x, step back 10x each leg, mini squats 10x. All holding onto wall for balance.  Holding yellow noodle: walk from half wall to other side of the water  bench: sit to rest about 1 min, then walk to the stairs. Back stretch holding the rails: hold 5 sec 4x. Close SBA to exit pool at stairs, pt did not want the lift chair.     05/11/24 evaluation  PATIENT EDUCATION:  Education details: Educated patient on anatomy and physiology of current symptoms, prognosis, plan of care as well as initial self care strategies to promote recovery Person educated: Patient Education method: Explanation Education comprehension: verbalized understanding  HOME EXERCISE PROGRAM: To be started  ASSESSMENT:  CLINICAL IMPRESSION:Pt returns to aquatic PT with her follow up visit. She reports she had no pain after her first session. She has spoken to the evaluating PT and has requested to only do aquatics for a few sessions before retuning to land. She hopes at that time she will be better able to tolerate the land exercises. She was able to increase her workload load today. Some fatigue and SOB observed. She recovers well with a short rest period. Improving ability to control her LE in the water  but still challenging.Pt needs verbal cuing for safe turns to sit down for seated exercises.    OBJECTIVE IMPAIRMENTS: decreased activity tolerance, decreased balance, decreased mobility, difficulty walking, decreased ROM, decreased  strength, impaired perceived functional ability, impaired flexibility, and pain.   ACTIVITY LIMITATIONS: carrying, lifting, bending, standing, squatting, sleeping, stairs, transfers, hygiene/grooming, and locomotion level  PARTICIPATION LIMITATIONS: meal prep, cleaning, laundry, driving, shopping, and community activity  PERSONAL FACTORS: Time since onset of injury/illness/exacerbation and 3+ comorbidities: cardiac history, leukemia, lymphedema, HTN are also affecting patient's functional outcome.   REHAB POTENTIAL: Good  CLINICAL DECISION MAKING: Evolving/moderate complexity  EVALUATION COMPLEXITY: Moderate   GOALS: Goals reviewed with patient? Yes  SHORT TERM GOALS: Target date: 06/08/2024    The patient will demonstrate knowledge of basic self care strategies and exercises to promote healing  Baseline: Goal status: INITIAL  2.  The patient will report a 30% improvement in pain levels with functional activities which are currently difficult including night time and in the mornings Baseline:  Goal status: INITIAL  3.  The patient will have improved Timed Up and Go (TUG) time to    20 sec   sec indicating improved gait speed and LE strength  Baseline:  Goal status: INITIAL  4.  The patient will be able to ambulate 350 feet in 3 minutes needed for short community walking, medical appts Baseline:  Goal status: INITIAL  5.  Improved LE strength improved as indicated by 5x STS test to 14 sec with min UE assist Baseline:  Goal status: INITIAL    LONG TERM GOALS: Target date: 07/06/2024    The patient will be independent in a safe self progression of a home exercise program to promote further recovery of function  Baseline:  Goal status: INITIAL  2.  The patient will report a 30% improvement in pain levels with functional activities which are currently difficult including night time and in the mornings Baseline:  Goal status: INITIAL  3.  The patient will have  improved gait stamina and speed needed to ambulate 600 feet in 6 minutes  Baseline:  Goal status: INITIAL  4.  The patient will have improved Timed Up and Go (TUG) time to   17   sec indicating improved gait speed, decreased risk of falls and LE strength  Baseline:  Goal status: INITIAL  5.  Modified Oswestry functional outcome measure score improved to   26  % indicating improved function with ADLS with less pain.   Baseline:  Goal status: INITIAL   PLAN:  PT FREQUENCY: 2x/week  PT DURATION: 8 weeks  PLANNED INTERVENTIONS: [id number]- PT Re-evaluation, 97750- Physical Performance Testing, 97110-Therapeutic exercises, 97530- Therapeutic activity, [id number]- Neuromuscular re-education, 97535-  Self Care, [id number]- Manual therapy, [phone]- Aquatic Therapy, [phone] (1-2 muscles), [id number] (3+ muscles)- Dry Needling, Patient/Family education, Balance training, Stair training, Taping, Joint mobilization, Cryotherapy, and Moist heat.  PLAN FOR NEXT SESSION: Assess first aquatic session, land: do BERG; LE strengthening; trunk strengthening;stretch HS and hip flexors No ES (pacemaker/defibrillator)  Delon Darner, PTA 05/26/24 3:03 PM   Phone: [phone] Fax: [phone]

## 2024-05-30 ENCOUNTER — Ambulatory Visit

## 2024-05-30 DIAGNOSIS — I5022 Chronic systolic (congestive) heart failure: Secondary | ICD-10-CM | POA: Diagnosis not present

## 2024-05-31 ENCOUNTER — Encounter

## 2024-05-31 LAB — CUP PACEART REMOTE DEVICE CHECK
Battery Remaining Longevity: 126 mo
Battery Voltage: 3.04 V
Brady Statistic RV Percent Paced: 1 %
Date Time Interrogation Session: 20251021050154
HighPow Impedance: 53 Ohm
Implantable Lead Connection Status: 753985
Implantable Lead Connection Status: 753985
Implantable Lead Connection Status: 753985
Implantable Lead Implant Date: 20170612
Implantable Lead Implant Date: 20170612
Implantable Lead Implant Date: 20170612
Implantable Lead Location: 753858
Implantable Lead Location: 753859
Implantable Lead Location: 753860
Implantable Lead Model: 4398
Implantable Lead Model: 5076
Implantable Pulse Generator Implant Date: 20250530
Lead Channel Impedance Value: 247 Ohm
Lead Channel Impedance Value: 323 Ohm
Lead Channel Impedance Value: 323 Ohm
Lead Channel Impedance Value: 342 Ohm
Lead Channel Impedance Value: 494 Ohm
Lead Channel Impedance Value: 494 Ohm
Lead Channel Impedance Value: 513 Ohm
Lead Channel Impedance Value: 627 Ohm
Lead Channel Impedance Value: 684 Ohm
Lead Channel Impedance Value: 760 Ohm
Lead Channel Impedance Value: 779 Ohm
Lead Channel Impedance Value: 779 Ohm
Lead Channel Impedance Value: 798 Ohm
Lead Channel Pacing Threshold Amplitude: 0.75 V
Lead Channel Pacing Threshold Amplitude: 1.25 V
Lead Channel Pacing Threshold Amplitude: 1.75 V
Lead Channel Pacing Threshold Pulse Width: 0.4 ms
Lead Channel Pacing Threshold Pulse Width: 0.4 ms
Lead Channel Pacing Threshold Pulse Width: 0.8 ms
Lead Channel Sensing Intrinsic Amplitude: 1.3 mV
Lead Channel Sensing Intrinsic Amplitude: 7.1 mV
Lead Channel Setting Pacing Amplitude: 1.5 V
Lead Channel Setting Pacing Amplitude: 1.75 V
Lead Channel Setting Pacing Amplitude: 2.75 V
Lead Channel Setting Pacing Pulse Width: 0.4 ms
Lead Channel Setting Pacing Pulse Width: 0.8 ms
Lead Channel Setting Sensing Sensitivity: 0.3 mV
Zone Setting Status: 755011

## 2024-06-01 ENCOUNTER — Ambulatory Visit: Payer: Self-pay | Admitting: Internal Medicine

## 2024-06-02 ENCOUNTER — Encounter: Payer: Self-pay | Admitting: Internal Medicine

## 2024-06-02 ENCOUNTER — Ambulatory Visit: Admitting: Internal Medicine

## 2024-06-02 ENCOUNTER — Encounter: Payer: Self-pay | Admitting: Physical Therapy

## 2024-06-02 ENCOUNTER — Ambulatory Visit: Admitting: Physical Therapy

## 2024-06-02 DIAGNOSIS — M5459 Other low back pain: Secondary | ICD-10-CM

## 2024-06-02 DIAGNOSIS — R262 Difficulty in walking, not elsewhere classified: Secondary | ICD-10-CM

## 2024-06-02 DIAGNOSIS — M6281 Muscle weakness (generalized): Secondary | ICD-10-CM

## 2024-06-02 NOTE — Therapy (Signed)
 OUTPATIENT PHYSICAL THERAPY THORACOLUMBAR PROGRESS NOTE   Patient Name: Lisa [name] MRN: [id number] DOB:01-Jul-1941, 83 y.o., female Today's Date: 06/02/2024  END OF SESSION:  PT End of Session - 06/02/24 1255     Visit Number 4    Date for Recertification  07/06/24    Authorization Type Medicare    Progress Note Due on Visit 10    PT Start Time 1255    PT Stop Time 1333    PT Time Calculation (min) 38 min    Activity Tolerance Patient tolerated treatment well;Patient limited by lethargy    Behavior During Therapy [facility] for tasks assessed/performed             Past Medical History:  Diagnosis Date   Acute on chronic systolic heart failure (HCC)    Acute renal failure superimposed on stage 3b chronic kidney disease (HCC) 05/22/2022   Bacteremia due to Pseudomonas 06/16/2022   CAD in native artery 2017   stent to LAD   Cardiac arrest (HCC) 09/2015   CHF (congestive heart failure) (HCC)    CML (chronic myelocytic leukemia) (HCC)    Complication of anesthesia    Hx: UTI (urinary tract infection)    Hypertension    Melanoma (HCC)    PONV (postoperative nausea and vomiting)    PVC (premature ventricular contraction)    HISTORY OF   Thyroid  disease    V tach (HCC)    Past Surgical History:  Procedure Laterality Date   ABDOMINAL HYSTERECTOMY     APPENDECTOMY     CARDIAC CATHETERIZATION N/A 10/03/2015   Procedure: Left Heart Cath and Coronary Angiography;  Surgeon: Peter M Swaziland, MD;  Location: [facility] INVASIVE CV LAB;  Service: Cardiovascular;  Laterality: N/A;   CARDIAC CATHETERIZATION N/A 10/07/2015   Procedure: Coronary Stent Intervention;  Surgeon: Lonni JONETTA Cash, MD;  Location: [facility] INVASIVE CV LAB;  Service: Cardiovascular;  Laterality: N/A;   CARDIAC CATHETERIZATION  09/22/2019   EP IMPLANTABLE DEVICE N/A 01/20/2016   Procedure: BiV ICD Insertion CRT-D;  Surgeon: Danelle LELON Birmingham, MD;  Location: [facility] INVASIVE CV LAB;  Service: Cardiovascular;  Laterality: N/A;    ICD GENERATOR CHANGEOUT N/A 01/07/2024   Procedure: ICD GENERATOR CHANGEOUT;  Surgeon: Birmingham Danelle LELON, MD;  Location: [facility] INVASIVE CV LAB;  Service: Cardiovascular;  Laterality: N/A;   IR THORACENTESIS ASP PLEURAL SPACE W/IMG GUIDE  07/20/2023   IR THORACENTESIS ASP PLEURAL SPACE W/IMG GUIDE  12/02/2023   LEFT HEART CATH AND CORONARY ANGIOGRAPHY N/A 09/22/2019   Procedure: LEFT HEART CATH AND CORONARY ANGIOGRAPHY;  Surgeon: Swaziland, Peter M, MD;  Location: [facility] INVASIVE CV LAB;  Service: Cardiovascular;  Laterality: N/A;   TONSILLECTOMY     TONSILLECTOMY     TUBAL LIGATION     Patient Active Problem List   Diagnosis Date Noted   ICD (implantable cardioverter-defibrillator) discharge 12/29/2023   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   Need for management of chest tube 12/06/2023   Hydropneumothorax 12/04/2023   Obesity, class 1 08/20/2023   Fall at home, initial encounter 08/09/2023   Generalized weakness 08/09/2023   Elevated troponin 08/09/2023   Anemia of chronic disease 08/09/2023   Acute on chronic diastolic heart failure (HCC) 08/08/2023   Cellulitis, leg 07/17/2023   Open wound of right lower extremity 07/17/2023   Respiratory distress 07/17/2023   Recurrent left pleural effusion 07/17/2023   Heart failure with reduced ejection fraction (HCC) 07/17/2023   Chronic kidney disease, stage III (moderate) (HCC) 07/17/2023   Obesity (BMI  30-39.9) 07/17/2023   Urine culture positive 06/19/2022   Transaminitis 06/17/2022   Viral gastroenteritis 06/16/2022   Venous stasis dermatitis of both lower extremities 05/26/2022   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 05/22/2022   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/21/2022   Paroxysmal ventricular tachycardia (HCC) 09/21/2019   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   Unstable angina (HCC) 10/07/2015   Chronic systolic heart failure (HCC)    PVC's (premature ventricular contractions)    Ventricular fibrillation (HCC)  10/03/2015   History of cardiac arrest 10/03/2015   Essential hypertension 09/09/2015   Lymphedema 02/21/2015   Acquired hypothyroidism 02/21/2015    PCP: Claudene Pellet MD  REFERRING PROVIDER: Claudene Pellet MD  REFERRING DIAG: M54.50 LBP  Rationale for Evaluation and Treatment: Rehabilitation  THERAPY DIAG:  Other low back pain  Muscle weakness (generalized)  Difficulty in walking, not elsewhere classified  ONSET DATE: approx 1 year  SUBJECTIVE:                                                                                                                                                                                           SUBJECTIVE STATEMENT: This week I didn't eat much or very well and that has affected my legs (feel weak) and balance. Getting up out of the chair got harder this week but I am happy to be here to exercise.    PERTINENT HISTORY:  Leukemia oral chemo Pacemaker/debrillator CHF HTN Hospitalized in May for 1 week, fluid  2 falls in July: turning off light and tipped over on side;  the other fall was after got out of shower and turned and sat straight down (had to get son and caregiver helped get off the floor) caregiver 2x/week, alone 4 days/week HHPT 4-5 weeks walking with 4 WW, bands (red) for legs and arms  Blood clot in leg Lymphedema   PAIN:   Are you having pain? Yes NPRS scale: 4/10 with sitting; night time really bad  Pain location: bil lumbar Pain orientation: Bilateral  PAIN TYPE: aching Pain description: intermittent  Aggravating factors: night, early AM, lying down  Relieving factors: sleep with heating pad; Tylenol , Blue Emu, Tiger Balm  PRECAUTIONS: ICD/Pacemaker    WEIGHT BEARING RESTRICTIONS: fall  FALLS:  Has patient fallen in last 6 months? Yes. Number of falls 2  LIVING ENVIRONMENT: Lives with: lives alone Lives in: House/apartment Stairs: No, 3 steps to get in the door (getting better) Has following equipment at  home: Vannie - 4 wheeled, shower chair  OCCUPATION: Psychologist, counselling, real estate, currently volunteer for [employer]  PLOF: Independent with basic ADLs  PATIENT GOALS:  want to have hydrotherapy; go as far as I can in mobility; walk without help but I'm too off balance; be more flexible; get in car easier without hands assisting legs; energy to walk around grocery store  NEXT MD VISIT: as needed  OBJECTIVE:  Note: Objective measures were completed at Evaluation unless otherwise noted.  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  2. Personal care (washing, dressing, etc.) 0 =  I can take care of myself normally without causing increased pain.  3. Lifting 3 = Pain prevents me from lifting heavy weights, but I can manage (5) I have hardly any social life because of my pain. light to medium weights if they are conveniently positioned  4. Walking 3 =  Pain prevents me from walking more than  mile.  5. Sitting 1 =  I can only sit in my favorite chair as long as I like.  6. Standing 3 =  Pain prevents me from standing more than 1/2 hour.  7. Sleeping 1 = I can sleep well only by using pain medication.  8. Social Life 1 =  My social life is normal, but it increases my level of pain.  9. Traveling 1 =  I can travel anywhere, but it increases my pain.  10. Employment/ Homemaking 2 = I can perform most of my homemaking/job duties, but pain prevents me from performing more physically stressful activities (eg, lifting, vacuuming).  Total 34%   Interpretation of scores: Score Category Description  0-20% Minimal Disability The patient can cope with most living activities. Usually no treatment is indicated apart from advice on lifting, sitting and exercise   21-40% Moderate Disability The patient experiences more pain and difficulty with sitting, lifting and standing. Travel and social life are more difficult and they may be disabled from work. Personal care, sexual activity and sleeping are not grossly affected, and the patient can usually be managed by conservative means  41-60% Severe Disability Pain remains the main problem in this group, but activities of daily living are affected. These patients require a detailed investigation  61-80% Crippled Back pain impinges on all aspects of the patient's life. Positive intervention is required  81-100% Bed-bound  These patients are either bed-bound or exaggerating their symptoms  Bluford FORBES Zoe DELENA Karon DELENA, et al. Surgery versus conservative management of stable thoracolumbar fracture: the PRESTO feasibility RCT. [place] ([place]): [employer]; 2021 Nov. [facility] Technology Assessment, No. 25.62.) Appendix 3, Oswestry Disability Index category descriptors. Available from: FindJewel[employer]cz  Minimally Clinically Important Difference (MCID) = 12.8%  COGNITION: Overall cognitive status: Within functional limits for tasks assessed     MUSCLE LENGTH: Decreased HS and hip flexor lengths bil POSTURE: tends to stand forward flexed slightly, reports decreased back pain with standing erect  LUMBAR ROM:   AROM eval  Flexion 50  Extension 10  Right lateral flexion   Left lateral flexion   Right rotation   Left rotation    (Blank rows = not tested)  TRUNK STRENGTH:  Decreased activation of transverse abdominus muscles; abdominals 4-/5; decreased activation of lumbar multifidi; trunk extensors 4-/5   LOWER EXTREMITY ROM:   lacks full knee extension bil;  hip flexion 70 degrees, hip extension 5 degrees, unable to achieve  seated figure 4 (external rotation)  LOWER EXTREMITY MMT:  unable to rise from a standard height chair without significant UE assist; unable to SLS  without heavy UE assist  MMT Right eval Left eval  Hip flexion 4- 4-  Hip extension 4- 4-  Hip abduction 3+ 3+  Hip adduction    Hip internal rotation    Hip external rotation    Knee flexion 4- 4-  Knee extension 3+ 3+  Ankle dorsiflexion 4- 4-  Ankle plantarflexion 4- 4-  Ankle inversion    Ankle eversion     (Blank rows = not tested)   FUNCTIONAL TESTS:  5x STS 15.43 sec shortness of breath, low back pain TUG with 4 WW hands assist 24.63 sec back pain < 5 2 MWT 222 feet RPE 5-6/10  O2 98%; strain feeling in back   GAIT: Comments: decreased gait speed, using 4 [employer]  TREATMENT DATE:   06/02/24:Pt arrives for aquatic physical therapy. Treatment took place in 3.5-5.5 feet of water . Water  temperature was 91 degrees F. Pt entered the pool via stairs with extra time, step to pattern and heavy use of rails. Pt requires buoyancy of water  for support and to offload joints with strengthening exercises.  Seated at bench: VC to safely get into seated position. Allowed pt 2 min to relax, focus on breathing naturally/slowly in order to settle into the water  especially at her hips. Once settled: Knee kicks 1 min 2x, ankle pumps Bil 2x20, marching 15x,  Standing at half wall (near where she was sitting) heel raises 15x, side steps Bil 15x, step back 15x each leg, mini squats 15x. All holding onto wall for balance.  Holding yellow noodle: walk from half wall to other side of the water  bench: sit to rest about 30 sec, sit to stand  with yellow noodle2x6: rest:walk back towards lift chair and back to the bench. Rest and repeat walk. Back stretch holding the rails: hold 5 sec 4x. Close SBA to exit pool at stairs, pt did not want the lift chair.    05/26/24:Pt arrives for aquatic physical therapy. Treatment took place in 3.5-5.5 feet of water . Water  temperature was 91 degrees F. Pt entered the pool via stairs with extra time and heavy use of rails. Pt requires buoyancy of water  for support and to  offload joints with strengthening exercises.  Seated at bench: VC to safely get into seated position. Allowed pt 2 min to relax, focus on breathing naturally/slowly in order to settle into the water  especially at her hips. Once settled: Knee kicks 1 min 2x, ankle pumps Bil 2x20, marching 15x,  Standing at half wall (near where she was sitting) heel raises 10x, side steps Bil 10x, step back 10x each leg, mini squats 10x. All holding onto wall for balance.  Holding yellow noodle: walk from half wall to other side of the water  bench: sit to rest about 30 sec, stand up with yellow noodle and walk back to original seat at th ebench. She repeated this 2x. Sit to stand at bench holding yellow noodle 10x. Rest  Back stretch holding the rails: hold 5 sec 4x. Close SBA to exit pool at stairs, pt did not want the lift chair.      05/19/24: Pt arrives for aquatic physical therapy. Treatment took place in 3.5-5.5 feet of water . Water  temperature was 91 degrees F. Pt entered the pool via stairs with extra time and heavy use of rails. Pt requires buoyancy of water  for support  and to offload joints with strengthening exercises.  Seated at bench: VC to safely get into seated position. Allowed pt 2 min to relax, focus on breathing naturally/slowly in order to settle into the water  especially at her hips. Once settled: Knee kicks 1 min 2x, ankle pumps Bil 2x20, marching 15x,  Standing at half wall (near where she was sitting) heel raises 10x, side steps Bil 10x, step back 10x each leg, mini squats 10x. All holding onto wall for balance.  Holding yellow noodle: walk from half wall to other side of the water  bench: sit to rest about 1 min, then walk to the stairs. Back stretch holding the rails: hold 5 sec 4x. Close SBA to exit pool at stairs, pt did not want the lift chair.                                                                                                             PATIENT EDUCATION:  Education details:  Educated patient on anatomy and physiology of current symptoms, prognosis, plan of care as well as initial self care strategies to promote recovery Person educated: Patient Education method: Explanation Education comprehension: verbalized understanding  HOME EXERCISE PROGRAM: To be started  ASSESSMENT:  CLINICAL IMPRESSION: Despite pt reporting her week was difficult she did very well with her aquatic exercises today. She increased reps on her wall exercises and walked further. Minor fatigue but nothing unusual for her age and condition.  OBJECTIVE IMPAIRMENTS: decreased activity tolerance, decreased balance, decreased mobility, difficulty walking, decreased ROM, decreased strength, impaired perceived functional ability, impaired flexibility, and pain.   ACTIVITY LIMITATIONS: carrying, lifting, bending, standing, squatting, sleeping, stairs, transfers, hygiene/grooming, and locomotion level  PARTICIPATION LIMITATIONS: meal prep, cleaning, laundry, driving, shopping, and community activity  PERSONAL FACTORS: Time since onset of injury/illness/exacerbation and 3+ comorbidities: cardiac history, leukemia, lymphedema, HTN are also affecting patient's functional outcome.   REHAB POTENTIAL: Good  CLINICAL DECISION MAKING: Evolving/moderate complexity  EVALUATION COMPLEXITY: Moderate   GOALS: Goals reviewed with patient? Yes  SHORT TERM GOALS: Target date: 06/08/2024    The patient will demonstrate knowledge of basic self care strategies and exercises to promote healing  Baseline: Goal status: INITIAL  2.  The patient will report a 30% improvement in pain levels with functional activities which are currently difficult including night time and in the mornings Baseline:  Goal status: INITIAL  3.  The patient will have improved Timed Up and Go (TUG) time to    20 sec   sec indicating improved gait speed and LE strength  Baseline:  Goal status: INITIAL  4.  The patient will be able  to ambulate 350 feet in 3 minutes needed for short community walking, medical appts Baseline:  Goal status: INITIAL  5.  Improved LE strength improved as indicated by 5x STS test to 14 sec with min UE assist Baseline:  Goal status: INITIAL    LONG TERM GOALS: Target date: 07/06/2024    The patient will be independent in a safe self progression of a home  exercise program to promote further recovery of function  Baseline:  Goal status: INITIAL  2.  The patient will report a 30% improvement in pain levels with functional activities which are currently difficult including night time and in the mornings Baseline:  Goal status: INITIAL  3.  The patient will have improved gait stamina and speed needed to ambulate 600 feet in 6 minutes  Baseline:  Goal status: INITIAL  4.  The patient will have improved Timed Up and Go (TUG) time to   17   sec indicating improved gait speed, decreased risk of falls and LE strength  Baseline:  Goal status: INITIAL  5.  Modified Oswestry functional outcome measure score improved to   26  % indicating improved function with ADLS with less pain.   Baseline:  Goal status: INITIAL   PLAN:  PT FREQUENCY: 2x/week  PT DURATION: 8 weeks  PLANNED INTERVENTIONS: 97164- PT Re-evaluation, 97750- Physical Performance Testing, 97110-Therapeutic exercises, 97530- Therapeutic activity, [id number]- Neuromuscular re-education, 97535- Self Care, [id number]- Manual therapy, [phone]- Aquatic Therapy, [phone] (1-2 muscles), [id number] (3+ muscles)- Dry Needling, Patient/Family education, Balance training, Stair training, Taping, Joint mobilization, Cryotherapy, and Moist heat.  PLAN FOR NEXT SESSION: Slow progression of aquatic exercises; consider adding some hip circumduction movements.  No ES (pacemaker/defibrillator)  Delon Darner, PTA 06/02/24 1:32 PM   Phone: [phone] Fax: [phone]

## 2024-06-02 NOTE — Progress Notes (Signed)
 Remote ICD Transmission

## 2024-06-04 NOTE — Progress Notes (Signed)
 Cardiology Office Note   Date:  06/09/2024  ID:  Lisa, [name] 01/09/1941, MRN [id number]  PCP:  Claudene Pellet, MD  Cardiologist:   Vina Gull, MD   Pt presents for follow up of CAD and HTN    History of Present Illness:F Lisa [name] is a 83 y.o. female with a history of CAD, VF arrest HFrEF, HTN, HL, LBBB, morbid obesity,  hypothroidism, chronic lymphedema,  hx of CML and melanoma   b 2017  She is s/p VF arrest, (s/p BIV ICD placement 2017) brief PAF (not on anticoagulation because brief)       2018:  Echo shoewd LVEF normalized Lisinopril  stopped in past due to hyperkalemia    Feb 2021 Pt hospitalized with episode of VT and ICD therapy.  Cardiac catheterization was done.  This showed a 65% ramus narrowing.  LVEDP was moderately elevated at 23.SABRA  LVEF was 50 to 55%.  She was seen by EP.  Placed on amiodarone .   The pt is  followed in oncology at [facility]  Found to have continued large left pleural effusion   She underwent L thoracentesis on 12/02/23  1.5 L removed  Complicated by hydropneumothorax Pt had VT with ICD firing   Started on amiodarone  IV  CT 4/26 with large L hydropneumothorax   Seen at [facility] urgent care 4/24 for back and flank pain  Dx UTI  Rx po ABX    April 2025  Admitted to [facility] for R flank/back pain       FOund to have a large L hydropneuothorax (after thoracentesis  on 4/25)  Underwent chest tube placement   Developed VT with ICD firing    Rx IV amiodaonre, switched to PO   Arrhythmias felt to be due to metabolic abnormalities   Pt sent home on 12/16/23  I saw the pt in May 2025   She was readmitted to hospital in late may afer ICD shock on 12/24/23  Occurred while sleeping  Amiodarone  was increased   VQ done and then CT   Found to have a R sided PE      On 01/06/24 had ICD generator change      The pt was seen by KANDICE Birmingham in Aug 2025  No further VT    The pt says she fell    Felt weak, off balance for the past several weaks  No dizzy  Denies  palpitations  Breathing is stable   No symptoms of angina    Current Meds  Medication Sig   acetaminophen  (TYLENOL ) 500 MG tablet Take 1,000 mg by mouth every 6 (six) hours as needed for moderate pain (pain score 4-6).   allopurinol  (ZYLOPRIM ) 100 MG tablet Take 100 mg by mouth daily.    amiodarone  (PACERONE ) 200 MG tablet Take 1 tablet (200 mg total) by mouth at bedtime.   asciminib hcl (SCEMBLIX) 40 MG tablet Take 40 mg by mouth daily.   aspirin  EC 81 MG tablet Take 81 mg by mouth in the morning.   atorvastatin  (LIPITOR) 20 MG tablet Take 1 tablet (20 mg total) by mouth daily.   calcium  carbonate (OS-CAL - DOSED IN MG OF ELEMENTAL CALCIUM ) 1250 (500 Ca) MG tablet Take 1 tablet by mouth daily as needed (Calcium  levels).   Cholecalciferol  (VITAMIN D -3 PO) Take 5,000 Units by mouth daily.   Cyanocobalamin (VITAMIN B12 SL) Place 1 tablet under the tongue daily.   dabigatran  (PRADAXA ) 75 MG CAPS capsule Take  1 capsule (75 mg total) by mouth 2 (two) times daily.   loperamide  (IMODIUM ) 2 MG capsule Take 2 mg by mouth as needed.   MAGNESIUM  GLYCINATE PO Take 1 tablet by mouth at bedtime.   nitroGLYCERIN  (NITROSTAT ) 0.4 MG SL tablet Place 1 tablet (0.4 mg total) under the tongue every 5 (five) minutes x 3 doses as needed for chest pain.   Pitavastatin Calcium  4 MG TABS Take 1 tablet by mouth daily.   potassium chloride  (KLOR-CON  M) 10 MEQ tablet Take 1 tablet (10 mEq total) by mouth as needed. (Patient taking differently: Take 10 mEq by mouth 2 (two) times daily.)   thyroid  (ARMOUR) 15 MG tablet Take 15 mg by mouth daily before breakfast.    timolol  (BETIMOL ) 0.5 % ophthalmic solution Place 1 drop into both eyes 2 (two) times daily.   torsemide  (DEMADEX ) 20 MG tablet Take 1 tablet (20 mg total) by mouth daily.   [DISCONTINUED] nitroGLYCERIN  (NITROSTAT ) 0.4 MG SL tablet Place 1 tablet (0.4 mg total) under the tongue every 5 (five) minutes x 3 doses as needed for chest pain.     Allergies:   Flagyl  [metronidazole], Penicillins, Sulfamethoxazole, Dilaudid  [hydromorphone  hcl], Meperidine, Propoxyphene, Tape, Doxycycline , Lanolin, and Sulfa antibiotics   Past Medical History:  Diagnosis Date   Acute on chronic systolic heart failure (HCC)    Acute renal failure superimposed on stage 3b chronic kidney disease (HCC) 05/22/2022   Bacteremia due to Pseudomonas 06/16/2022   CAD in native artery 2017   stent to LAD   Cardiac arrest (HCC) 09/2015   CHF (congestive heart failure) (HCC)    CML (chronic myelocytic leukemia) (HCC)    Complication of anesthesia    Hx: UTI (urinary tract infection)    Hypertension    Melanoma (HCC)    PONV (postoperative nausea and vomiting)    PVC (premature ventricular contraction)    HISTORY OF   Thyroid  disease    V tach (HCC)     Past Surgical History:  Procedure Laterality Date   ABDOMINAL HYSTERECTOMY     APPENDECTOMY     CARDIAC CATHETERIZATION N/A 10/03/2015   Procedure: Left Heart Cath and Coronary Angiography;  Surgeon: Peter M Jordan, MD;  Location: [facility] INVASIVE CV LAB;  Service: Cardiovascular;  Laterality: N/A;   CARDIAC CATHETERIZATION N/A 10/07/2015   Procedure: Coronary Stent Intervention;  Surgeon: Lonni JONETTA Cash, MD;  Location: [facility] INVASIVE CV LAB;  Service: Cardiovascular;  Laterality: N/A;   CARDIAC CATHETERIZATION  09/22/2019   EP IMPLANTABLE DEVICE N/A 01/20/2016   Procedure: BiV ICD Insertion CRT-D;  Surgeon: Danelle LELON Birmingham, MD;  Location: [facility] INVASIVE CV LAB;  Service: Cardiovascular;  Laterality: N/A;   ICD GENERATOR CHANGEOUT N/A 01/07/2024   Procedure: ICD GENERATOR CHANGEOUT;  Surgeon: Birmingham Danelle LELON, MD;  Location: [facility] INVASIVE CV LAB;  Service: Cardiovascular;  Laterality: N/A;   IR THORACENTESIS ASP PLEURAL SPACE W/IMG GUIDE  07/20/2023   IR THORACENTESIS ASP PLEURAL SPACE W/IMG GUIDE  12/02/2023   LEFT HEART CATH AND CORONARY ANGIOGRAPHY N/A 09/22/2019   Procedure: LEFT HEART CATH AND CORONARY ANGIOGRAPHY;  Surgeon:  Jordan, Peter M, MD;  Location: [facility] INVASIVE CV LAB;  Service: Cardiovascular;  Laterality: N/A;   TONSILLECTOMY     TONSILLECTOMY     TUBAL LIGATION       Social History:  The patient  reports that she has never smoked. She has never used smokeless tobacco. She reports that she does not drink alcohol and does  not use drugs.   Family History:  The patient's family history includes Coronary artery disease in her mother; Heart attack in her maternal grandfather and paternal grandfather; Multiple myeloma in her father; Thyroid  disease in her mother.    ROS:  Please see the history of present illness. All other systems are reviewed and  Negative to the above problem except as noted.    PHYSICAL EXAM: VS:  BP 124/82 (BP Location: Left Arm, Patient Position: Sitting)   Pulse 76   Ht 5' 1.5 (1.562 m)   Wt 119 lb 9.6 oz (54.3 kg)   LMP 02/14/1995   SpO2 95%   BMI 22.23 kg/m   GEN: Pt is in no distress HEENT: normal  Neck: JVP is normal \\Cardiac : RRR; no murmurs Lungs Clear to auscultation bilaterally  Abdomen  No masses  Nontender  Leg:   Chronic lymphedema  EKG:  EKG is not done today    Echo April 2025  1. Left ventricular ejection fraction, by estimation, is 40 to 45%. The  left ventricle has mildly decreased function. The left ventricle has no  regional wall motion abnormalities. Left ventricular diastolic parameters  are consistent with Grade I  diastolic dysfunction (impaired relaxation).   2. Right ventricular systolic function is normal. The right ventricular  size is normal.   3. The mitral valve is normal in structure. No evidence of mitral valve  regurgitation. No evidence of mitral stenosis.   4. The aortic valve is normal in structure. Aortic valve regurgitation is  not visualized. No aortic stenosis is present.   5. The inferior vena cava is normal in size with greater than 50%  respiratory variability, suggesting right atrial pressure of 3 mmHg.    Comparison(s): No significant change from prior study. Prior images  reviewed side by side.   Lipid Panel    Component Value Date/Time   CHOL 92 (L) 10/06/2019 1059   TRIG 92 10/06/2019 1059   HDL 39 (L) 10/06/2019 1059   CHOLHDL 2.4 10/06/2019 1059   CHOLHDL 4.2 10/04/2015 0329   VLDL 18 10/04/2015 0329   LDLCALC 35 10/06/2019 1059      Wt Readings from Last 3 Encounters:  06/09/24 119 lb 9.6 oz (54.3 kg)  03/23/24 133 lb 9.6 oz (60.6 kg)  01/20/24 143 lb (64.9 kg)      ASSESSMENT AND PLAN:  1  HFrEF  Pt with mld /mod LV dysfunction. She is s/p BiV ICD  with recent generator change Volume status appears OK With falling and weakness I am reluctant to add meds right now   She is not on GDMT   Follow for now     2   CAD   Last LHC in 2021   Patent stent  Pt is without CP   Stop ASA since on Pradaxa   3  Hx VF arrest  and recurrent VT   Pt is S/p [employer] BiV ICD   Remains on amiodarone  Follow with EP  4 Transient PAF short-lived   5  HTN  BP is well  controlled   6  PE   Currently on Pradaxa   7  Hx hydropneumothorax    Follow over time   No plan for thoracentesis    8  HL  On Livalo   Can hold an see if weakness improves  Need to follow lipids   8  CML.  Continues to follow with oncology  She was on Gleevac  Follow up in Feb 2026  Current medicines are reviewed at length with the patient today.  The patient does not have concerns regarding medicines.  Signed, Vina Gull, MD  06/11/2024 10:20 PM    [facility] [address] [place], [place], [place]  [address] Phone: [phone]; Fax: [phone]

## 2024-06-05 ENCOUNTER — Emergency Department (HOSPITAL_BASED_OUTPATIENT_CLINIC_OR_DEPARTMENT_OTHER)

## 2024-06-05 ENCOUNTER — Emergency Department (HOSPITAL_BASED_OUTPATIENT_CLINIC_OR_DEPARTMENT_OTHER)
Admission: EM | Admit: 2024-06-05 | Discharge: 2024-06-05 | Disposition: A | Discharge status: Home / Self Care | Attending: Emergency Medicine | Admitting: Emergency Medicine

## 2024-06-05 ENCOUNTER — Encounter (HOSPITAL_BASED_OUTPATIENT_CLINIC_OR_DEPARTMENT_OTHER): Payer: Self-pay

## 2024-06-05 ENCOUNTER — Other Ambulatory Visit: Payer: Self-pay

## 2024-06-05 DIAGNOSIS — K5900 Constipation, unspecified: Secondary | ICD-10-CM | POA: Insufficient documentation

## 2024-06-05 DIAGNOSIS — R7989 Other specified abnormal findings of blood chemistry: Secondary | ICD-10-CM | POA: Diagnosis not present

## 2024-06-05 DIAGNOSIS — Z7982 Long term (current) use of aspirin: Secondary | ICD-10-CM | POA: Insufficient documentation

## 2024-06-05 DIAGNOSIS — E039 Hypothyroidism, unspecified: Secondary | ICD-10-CM | POA: Diagnosis not present

## 2024-06-05 DIAGNOSIS — I509 Heart failure, unspecified: Secondary | ICD-10-CM | POA: Insufficient documentation

## 2024-06-05 DIAGNOSIS — I110 Hypertensive heart disease with heart failure: Secondary | ICD-10-CM | POA: Diagnosis not present

## 2024-06-05 DIAGNOSIS — R9431 Abnormal electrocardiogram [ECG] [EKG]: Secondary | ICD-10-CM | POA: Insufficient documentation

## 2024-06-05 LAB — URINALYSIS, ROUTINE W REFLEX MICROSCOPIC
Bilirubin Urine: NEGATIVE
Glucose, UA: NEGATIVE mg/dL
Hgb urine dipstick: NEGATIVE
Ketones, ur: NEGATIVE mg/dL
Leukocytes,Ua: NEGATIVE
Nitrite: NEGATIVE
Protein, ur: NEGATIVE mg/dL
Specific Gravity, Urine: 1.009 (ref 1.005–1.030)
pH: 7 (ref 5.0–8.0)

## 2024-06-05 LAB — COMPREHENSIVE METABOLIC PANEL WITH GFR
ALT: 68 U/L — ABNORMAL HIGH (ref 0–44)
AST: 55 U/L — ABNORMAL HIGH (ref 15–41)
Albumin: 4 g/dL (ref 3.5–5.0)
Alkaline Phosphatase: 114 U/L (ref 38–126)
Anion gap: 12 (ref 5–15)
BUN: 33 mg/dL — ABNORMAL HIGH (ref 8–23)
CO2: 27 mmol/L (ref 22–32)
Calcium: 10.3 mg/dL (ref 8.9–10.3)
Chloride: 96 mmol/L — ABNORMAL LOW (ref 98–111)
Creatinine, Ser: 1.45 mg/dL — ABNORMAL HIGH (ref 0.44–1.00)
GFR, Estimated: 36 mL/min — ABNORMAL LOW (ref >60)
Glucose, Bld: 113 mg/dL — ABNORMAL HIGH (ref 70–99)
Potassium: 4.5 mmol/L (ref 3.5–5.1)
Sodium: 135 mmol/L (ref 135–145)
Total Bilirubin: 0.6 mg/dL (ref 0.0–1.2)
Total Protein: 7.9 g/dL (ref 6.5–8.1)

## 2024-06-05 LAB — CBC
HCT: 34.3 % — ABNORMAL LOW (ref 36.0–46.0)
Hemoglobin: 11.2 g/dL — ABNORMAL LOW (ref 12.0–15.0)
MCH: 28.9 pg (ref 26.0–34.0)
MCHC: 32.7 g/dL (ref 30.0–36.0)
MCV: 88.6 fL (ref 80.0–100.0)
Platelets: 330 K/uL (ref 150–400)
RBC: 3.87 MIL/uL (ref 3.87–5.11)
RDW: 14.7 % (ref 11.5–15.5)
WBC: 9.4 K/uL (ref 4.0–10.5)
nRBC: 0 % (ref 0.0–0.2)

## 2024-06-05 LAB — D-DIMER, QUANTITATIVE: D-Dimer, Quant: 3.14 ug{FEU}/mL — ABNORMAL HIGH (ref 0.00–0.50)

## 2024-06-05 LAB — LIPASE, BLOOD: Lipase: 34 U/L (ref 11–51)

## 2024-06-05 MED ORDER — IOHEXOL 350 MG/ML SOLN
100.0000 mL | Freq: Once | INTRAVENOUS | Status: AC | PRN
  Administered 2024-06-05: 75 mL via INTRAVENOUS

## 2024-06-05 NOTE — ED Notes (Signed)
 Urinated , post void bladder scan shows , EDP is aware.

## 2024-06-05 NOTE — ED Triage Notes (Signed)
 Patient reports left sided pain for 6 weeks. She also said she has been having constipation and taking lots of Miralax . She now reports she is nauseated and loss of appetite. She said the pain wakes her up in the middle of the night and it has been progressing.

## 2024-06-05 NOTE — Discharge Instructions (Addendum)
 I have given you instructions for what I referred to as a MiraLAX  cleanout.  Please follow the directions below.  After that I recommend that you increase your by MiraLAX  1 capful/17 g packet daily until you are having soft poops and are not having to strain with bowel movements.  Make sure you are drinking plenty of water  and exercising whether that be walking running or stationary bike.  Your LFTs liver function tests were slightly abnormal. You'll need to have these rechecked by your primary care  provider.   Your QT was slightly prolonged -- follow up your primary care doctor and cardiologist regarding this.  Cleanout:  Miralax  cleanout 5 capfuls in 24-36 oz gatorade, drink over 4-6 hrs. If stool is not clear after 24 hours, then repeat this dose for a second day.      I would like you to follow-up with urology given that you are having some issues urinating this may have been related to your constipation however it does warrant a follow-up appointment.

## 2024-06-05 NOTE — ED Notes (Signed)
 Pt urinated approx. (measured), but upon post void bladder scan, still shows in bladder.

## 2024-06-05 NOTE — ED Notes (Signed)
 Spoke with lab about d-dimer add-on

## 2024-06-05 NOTE — ED Provider Notes (Signed)
 [place] EMERGENCY DEPARTMENT AT [facility] Provider Note   CSN: [id number] Arrival date & time: 06/05/24  1140     Patient presents with: Flank Pain and Constipation   Lisa [name] is a 83 y.o. female.    Flank Pain  Constipation  Patient is an 83 year old patient with a past medical history significant for CHF, hypertension hyper hypothyroidism, chronic anemia, history of DVT/V-fib arrest, CML, obesity, pleural effusion,  Patient is emergency room today with complaints of some fatigue as well as some left flank pain seems to be lower abdominal radiating to left flank.  She has not had any blood in her stool.  Denies any frequency urgency dysuria or hematuria.  States that she feels that she is constipated has had bowel movements every couple days however she describes this as pellets.  No blood in her stool no dark or tarry appearance of stool.  Has not had any fevers or chills.  She also endorses some nausea but no vomiting states no nausea currently.  She indicates that her symptoms have been going on for 6 weeks but over the past few days she has had some nausea and worsening pain.  Her pain is nonpleuritic, worse with certain movements at times.     Prior to Admission medications   Medication Sig Start Date End Date Taking? Authorizing Provider  acetaminophen  (TYLENOL ) 500 MG tablet Take 1,000 mg by mouth every 6 (six) hours as needed for moderate pain (pain score 4-6).    [provider]  allopurinol  (ZYLOPRIM ) 100 MG tablet Take 100 mg by mouth daily.  06/15/17   [provider]  amiodarone  (PACERONE ) 200 MG tablet Take 1 tablet (200 mg total) by mouth at bedtime. 01/20/24   Leverne Charlies Helling, PA-C  aspirin  EC 81 MG tablet Take 81 mg by mouth in the morning.    [provider]  atorvastatin  (LIPITOR) 20 MG tablet Take 1 tablet (20 mg total) by mouth daily. 05/07/16   Bensimhon, Toribio SAUNDERS, MD  calcium  carbonate (OS-CAL - DOSED IN MG  OF ELEMENTAL CALCIUM ) 1250 (500 Ca) MG tablet Take 1 tablet by mouth daily as needed (Calcium  levels).    [provider]  Cholecalciferol  (VITAMIN D -3 PO) Take 5,000 Units by mouth daily.    [provider]  Cyanocobalamin (VITAMIN B12 SL) Place 1 tablet under the tongue daily.    [provider]  dabigatran  (PRADAXA ) 75 MG CAPS capsule Take 1 capsule (75 mg total) by mouth 2 (two) times daily. 03/07/24   Okey Vina GAILS, MD  MAGNESIUM  GLYCINATE PO Take 1 tablet by mouth at bedtime.    [provider]  nitroGLYCERIN  (NITROSTAT ) 0.4 MG SL tablet Place 1 tablet (0.4 mg total) under the tongue every 5 (five) minutes x 3 doses as needed for chest pain. 08/21/23   Arrien, Mauricio Daniel, MD  Pitavastatin Calcium  4 MG TABS Take 1 tablet by mouth daily.    [provider]  potassium chloride  (KLOR-CON  M) 10 MEQ tablet Take 1 tablet (10 mEq total) by mouth as needed. Patient taking differently: Take 10 mEq by mouth daily. 12/20/23   Okey Vina GAILS, MD  thyroid  (ARMOUR) 15 MG tablet Take 15 mg by mouth daily before breakfast.     [provider]  torsemide  (DEMADEX ) 20 MG tablet Take 1 tablet (20 mg total) by mouth daily. 03/14/24   Okey Vina GAILS, MD    Allergies: Flagyl [metronidazole], Penicillins, Sulfamethoxazole, Dilaudid  [hydromorphone  hcl], Meperidine, Propoxyphene,  Tape, Doxycycline , Lanolin, and Sulfa antibiotics    Review of Systems  Gastrointestinal:  Positive for constipation.  Genitourinary:  Positive for flank pain.    Updated Vital Signs BP (!) 188/88   Pulse 73   Temp 97.7 F (36.5 C) (Oral)   Resp 20   LMP 02/14/1995   SpO2 97%   Physical Exam Vitals and nursing note reviewed.  Constitutional:      General: She is not in acute distress. HENT:     Head: Normocephalic and atraumatic.     Nose: Nose normal.     Mouth/Throat:     Mouth: Mucous membranes are moist.  Eyes:     General: No scleral icterus. Cardiovascular:      Rate and Rhythm: Normal rate and regular rhythm.     Pulses: Normal pulses.     Heart sounds: Normal heart sounds.  Pulmonary:     Effort: Pulmonary effort is normal. No respiratory distress.     Breath sounds: No wheezing.  Abdominal:     Palpations: Abdomen is soft.     Tenderness: There is no abdominal tenderness. There is no guarding or rebound.     Comments: Some left CVA tenderness, some muscular tenderness over the left paravertebral musculature of the back.  No bony step-off or deformity or tenderness of the C, T, L-spine.  Musculoskeletal:     Cervical back: Normal range of motion.     Right lower leg: No edema.     Left lower leg: No edema.  Skin:    General: Skin is warm and dry.     Capillary Refill: Capillary refill takes less than 2 seconds.  Neurological:     Mental Status: She is alert. Mental status is at baseline.  Psychiatric:        Mood and Affect: Mood normal.        Behavior: Behavior normal.     (all labs ordered are listed, but only abnormal results are displayed) Labs Reviewed  COMPREHENSIVE METABOLIC PANEL WITH GFR - Abnormal; Notable for the following components:      Result Value   Chloride 96 (*)    Glucose, Bld 113 (*)    BUN 33 (*)    Creatinine, Ser 1.45 (*)    AST 55 (*)    ALT 68 (*)    GFR, Estimated 36 (*)    All other components within normal limits  CBC - Abnormal; Notable for the following components:   Hemoglobin 11.2 (*)    HCT 34.3 (*)    All other components within normal limits  URINALYSIS, ROUTINE W REFLEX MICROSCOPIC - Abnormal; Notable for the following components:   Color, Urine COLORLESS (*)    All other components within normal limits  D-DIMER, QUANTITATIVE - Abnormal; Notable for the following components:   D-Dimer, Quant 3.14 (*)    All other components within normal limits  LIPASE, BLOOD    EKG: EKG Interpretation Date/Time:  Monday June 05 2024 11:57:27 EDT Ventricular Rate:  82 PR Interval:  146 QRS  Duration:  121 QT Interval:  478 QTC Calculation: 559 R Axis:   212  Text Interpretation: atrial sensed ventricular paced rhythm Anterior infarct, old Prolonged QT interval No significant change since last tracing Confirmed by Jerrol Agent ([id number]) on 06/05/2024 4:31:13 PM  Radiology: CT Angio Chest PE W/Cm &/Or Wo Cm Result Date: 06/05/2024 CLINICAL DATA:  Left-sided chest pain for 6 weeks, positive D-dimer and history of PE, constipation, nausea, loss  of appetite EXAM: CT ANGIOGRAPHY CHEST CT ABDOMEN AND PELVIS WITH CONTRAST TECHNIQUE: Multidetector CT imaging of the chest was performed using the standard protocol during bolus administration of intravenous contrast. Multiplanar CT image reconstructions and MIPs were obtained to evaluate the vascular anatomy. Multidetector CT imaging of the abdomen and pelvis was performed using the standard protocol during bolus administration of intravenous contrast. RADIATION DOSE REDUCTION: This exam was performed according to the departmental dose-optimization program which includes automated exposure control, adjustment of the mA and/or kV according to patient size and/or use of iterative reconstruction technique. CONTRAST:  75mL OMNIPAQUE  IOHEXOL  350 MG/ML SOLN COMPARISON:  12/30/2023 FINDINGS: CT CHEST ANGIOGRAM FINDINGS Cardiovascular: Satisfactory opacification of the pulmonary arteries to the segmental level. No evidence of pulmonary embolism. Normal heart size. Left coronary artery calcifications and stent. No pericardial effusion. Left chest multi lead pacer defibrillator. Aortic atherosclerosis. Mediastinum/Nodes: No enlarged mediastinal, hilar, or axillary lymph nodes. Thyroid  gland, trachea, and esophagus demonstrate no significant findings. Lungs/Pleura: Small, chronic, loculated left pleural effusion. No acute airspace opacity. Musculoskeletal: Calcified bilateral breast implants. No acute osseous findings. Review of the MIP images confirms the above  findings. CT ABDOMEN PELVIS FINDINGS Hepatobiliary: No solid liver abnormality is seen. No gallstones, gallbladder wall thickening, or biliary dilatation. Pancreas: Unremarkable. No pancreatic ductal dilatation or surrounding inflammatory changes. Spleen: Normal in size without significant abnormality. Adrenals/Urinary Tract: Adrenal glands are unremarkable. Mild left hydronephrosis and proximal hydroureter, new compared to prior examination, without calculus or other obstruction visible (series 6, image 20). Right kidney is normal, without renal calculi, solid lesion, or hydronephrosis. Bladder is unremarkable. Stomach/Bowel: Stomach is within normal limits. Appendix not clearly visualized. No evidence of bowel wall thickening, distention, or inflammatory changes. Large burden of stool in the right colon. Vascular/Lymphatic: Aortic atherosclerosis. No enlarged abdominal or pelvic lymph nodes. Reproductive: Hysterectomy. Other: No abdominal wall hernia or abnormality. No ascites. Musculoskeletal: No acute or significant osseous findings. IMPRESSION: 1. Negative examination for pulmonary embolism. 2. Mild left hydronephrosis and proximal hydroureter, new compared to prior examination, without calculus or other obstruction visible. This is of uncertain significance. 3. Small, chronic, loculated left pleural effusion. 4. Coronary artery disease. Aortic Atherosclerosis (ICD10-I70.0). Electronically Signed   By: Marolyn JONETTA Jaksch M.D.   On: 06/05/2024 14:54   CT ABDOMEN PELVIS W CONTRAST Result Date: 06/05/2024 CLINICAL DATA:  Left-sided chest pain for 6 weeks, positive D-dimer and history of PE, constipation, nausea, loss of appetite EXAM: CT ANGIOGRAPHY CHEST CT ABDOMEN AND PELVIS WITH CONTRAST TECHNIQUE: Multidetector CT imaging of the chest was performed using the standard protocol during bolus administration of intravenous contrast. Multiplanar CT image reconstructions and MIPs were obtained to evaluate the vascular  anatomy. Multidetector CT imaging of the abdomen and pelvis was performed using the standard protocol during bolus administration of intravenous contrast. RADIATION DOSE REDUCTION: This exam was performed according to the departmental dose-optimization program which includes automated exposure control, adjustment of the mA and/or kV according to patient size and/or use of iterative reconstruction technique. CONTRAST:  75mL OMNIPAQUE  IOHEXOL  350 MG/ML SOLN COMPARISON:  12/30/2023 FINDINGS: CT CHEST ANGIOGRAM FINDINGS Cardiovascular: Satisfactory opacification of the pulmonary arteries to the segmental level. No evidence of pulmonary embolism. Normal heart size. Left coronary artery calcifications and stent. No pericardial effusion. Left chest multi lead pacer defibrillator. Aortic atherosclerosis. Mediastinum/Nodes: No enlarged mediastinal, hilar, or axillary lymph nodes. Thyroid  gland, trachea, and esophagus demonstrate no significant findings. Lungs/Pleura: Small, chronic, loculated left pleural effusion. No acute airspace opacity. Musculoskeletal: Calcified bilateral  breast implants. No acute osseous findings. Review of the MIP images confirms the above findings. CT ABDOMEN PELVIS FINDINGS Hepatobiliary: No solid liver abnormality is seen. No gallstones, gallbladder wall thickening, or biliary dilatation. Pancreas: Unremarkable. No pancreatic ductal dilatation or surrounding inflammatory changes. Spleen: Normal in size without significant abnormality. Adrenals/Urinary Tract: Adrenal glands are unremarkable. Mild left hydronephrosis and proximal hydroureter, new compared to prior examination, without calculus or other obstruction visible (series 6, image 20). Right kidney is normal, without renal calculi, solid lesion, or hydronephrosis. Bladder is unremarkable. Stomach/Bowel: Stomach is within normal limits. Appendix not clearly visualized. No evidence of bowel wall thickening, distention, or inflammatory changes.  Large burden of stool in the right colon. Vascular/Lymphatic: Aortic atherosclerosis. No enlarged abdominal or pelvic lymph nodes. Reproductive: Hysterectomy. Other: No abdominal wall hernia or abnormality. No ascites. Musculoskeletal: No acute or significant osseous findings. IMPRESSION: 1. Negative examination for pulmonary embolism. 2. Mild left hydronephrosis and proximal hydroureter, new compared to prior examination, without calculus or other obstruction visible. This is of uncertain significance. 3. Small, chronic, loculated left pleural effusion. 4. Coronary artery disease. Aortic Atherosclerosis (ICD10-I70.0). Electronically Signed   By: Marolyn JONETTA Jaksch M.D.   On: 06/05/2024 14:54   DG Chest Portable 1 View Result Date: 06/05/2024 EXAM: 1 VIEW XRAY OF THE CHEST 06/05/2024 12:50:00 PM COMPARISON: 01/21/2024 CLINICAL HISTORY: fatigue FINDINGS: LINES, TUBES AND DEVICES: Left subclavian AICD with leads in right atrium, right ventricle, and coronary sinus, stable. LUNGS AND PLEURA: Small left pleural effusion. Stable left apical pleural thickening, unchanged. No focal pulmonary opacity. No pulmonary edema. No pneumothorax. HEART AND MEDIASTINUM: Aortic atherosclerosis. No acute abnormality of the cardiac and mediastinal silhouettes. BONES AND SOFT TISSUES: Multilevel thoracic osteophytosis. IMPRESSION: 1. Small left pleural effusion. 2. Aortic atherosclerosis. Electronically signed by: Waddell Calk MD 06/05/2024 02:34 PM EDT RP Workstation: [id number]     .Ultrasound ED Peripheral IV (Provider)  Date/Time: 06/05/2024 3:56 PM  Performed by: Neldon Hamp RAMAN, PA Authorized by: Neldon Hamp RAMAN, PA   Procedure details:    Indications: multiple failed IV attempts     Skin Prep: chlorhexidine  gluconate     Location:  Right AC   Angiocath:  20 G   Bedside Ultrasound Guided: Yes     Images: not archived     Patient tolerated procedure without complications: Yes     Dressing applied: Yes       Medications Ordered in the ED  iohexol  (OMNIPAQUE ) 350 MG/ML injection 100 mL (75 mLs Intravenous Contrast Given 06/05/24 1431)    Clinical Course as of 06/05/24 1806  Mon Jun 05, 2024  1502 Cleanout:  Miralax  cleanout 5 capfuls in 24-36 oz gatorade, drink over 4-6 hrs. If stool is not clear after 24 hours, then repeat this dose for a second day.   After Cleanout:  Give Miralax  1 capful in 8 oz juice or gatorade daily for at least 4-6 weeks.  Schedule follow up with pediatrician if no improvement in constipation in 1-2 months, sooner if not resolved after cleanout.   [WF]    Clinical Course User Index [WF] Neldon Hamp RAMAN, PA                                 Medical Decision Making Amount and/or Complexity of Data Reviewed Labs: ordered. Radiology: ordered.  Risk Prescription drug management.   This patient presents to the ED for concern of flank pain, this involves  a number of treatment options, and is a complaint that carries with it a moderate to high risk of complications and morbidity. A differential diagnosis was considered for the patient's symptoms which is discussed below:   The differential diagnosis of emergent flank pain includes, but is not limited to :Abdominal aortic aneurysm,, Renal artery embolism,Renal vein thrombosis, Aortic dissection, Mesenteric ischemia, Pyelonephritis, Renal infarction, Renal hemorrhage, Nephrolithiasis/ Renal Colic, Bladder tumor,Cystitis, Biliary colic, Pancreatitis Perforated peptic ulcer Appendicitis ,Inguinal Hernia, Diverticulitis, Bowel obstruction Ectopic Pregnancy,PID/TOA,Ovarian cyst, Ovarian torsion)  Shingles Lower lobe pneumonia, Retroperitoneal hematoma/abscess/tumor, Epidural abscess, Epidural hematoma    Co morbidities: Discussed in HPI   Brief History:  Patient is an 83 year old patient with a past medical history significant for CHF, hypertension hyper hypothyroidism, chronic anemia, history of DVT/V-fib  arrest, CML, obesity, pleural effusion,  Patient is emergency room today with complaints of some fatigue as well as some left flank pain seems to be lower abdominal radiating to left flank.  She has not had any blood in her stool.  Denies any frequency urgency dysuria or hematuria.  States that she feels that she is constipated has had bowel movements every couple days however she describes this as pellets.  No blood in her stool no dark or tarry appearance of stool.  Has not had any fevers or chills.  She also endorses some nausea but no vomiting states no nausea currently.  She indicates that her symptoms have been going on for 6 weeks but over the past few days she has had some nausea and worsening pain.  Her pain is nonpleuritic, worse with certain movements at times.    EMR reviewed including pt PMHx, past surgical history and past visits to ER.   See HPI for more details   Lab Tests:   I ordered and independently interpreted labs. Labs notable for Patient had very mildly elevated LFTs AST 55, ALT 68 Recommend that she have these rechecked with primary Care lipase normal, urinalysis unremarkable, D-dimer elevated CT PE study negative for pulmonary embolism, small amount of left hydroureter perhaps recently passed stone.     Cardiac Monitoring:  The patient was maintained on a cardiac monitor.  I personally viewed and interpreted the cardiac monitored which showed an underlying rhythm of: NSR EKG non-ischemic slightly prolonged QT   Medicines ordered:    Critical Interventions:     Consults/Attending Physician   I discussed this case with my attending physician who cosigned this note including patient's presenting symptoms, physical exam, and planned diagnostics and interventions. Attending physician stated agreement with plan or made changes to plan which were implemented.   Reevaluation:  After the interventions noted above I re-evaluated patient and found that  they have :stayed the same   Social Determinants of Health:      Problem List / ED Course:  Patient have reassuring workup here in the emergency department for her left flank pain does have some urinary retention initially had approximately 350 mL postvoid residual after urinating however requested that she have a chance to urinate a second time and now has 275 mL.  Will discharge home without Foley catheter and recommend patient follow-up with gastroenterology.  I suspect that some of this may be related to her constipation and a bottleneck effect.  I have given her instructions for her constipation including MiraLAX  cleanout and maintenance.  Follow-up with gastroenterology regarding this.   Dispostion:  After consideration of the diagnostic results and the patients response to treatment, I feel that  the patent would benefit from close outpatient follow-up.   Final diagnoses:  Constipation, unspecified constipation type  LFT elevation  Prolonged Q-T interval on ECG    ED Discharge Orders     None          Neldon Hamp RAMAN, [place] 06/05/24 1806    Jerrol Agent, MD 06/05/24 [phone]

## 2024-06-06 ENCOUNTER — Encounter: Payer: Self-pay | Admitting: Internal Medicine

## 2024-06-07 ENCOUNTER — Encounter: Admitting: Physical Therapy

## 2024-06-09 ENCOUNTER — Encounter: Payer: Self-pay | Admitting: Internal Medicine

## 2024-06-09 ENCOUNTER — Ambulatory Visit: Attending: Internal Medicine | Admitting: Internal Medicine

## 2024-06-09 VITALS — BP 124/82 | HR 76 | Ht 61.5 in | Wt 119.6 lb

## 2024-06-09 DIAGNOSIS — I5020 Unspecified systolic (congestive) heart failure: Secondary | ICD-10-CM | POA: Diagnosis present

## 2024-06-09 MED ORDER — NITROGLYCERIN 0.4 MG SL SUBL: 0.4000 mg | SUBLINGUAL_TABLET | SUBLINGUAL | 3 refills | Status: AC | PRN

## 2024-06-09 NOTE — Patient Instructions (Addendum)
 Medication Instructions:  Your physician recommends that you continue on your current medications as directed. Please refer to the Current Medication list given to you today.  *If you need a refill on your cardiac medications before your next appointment, please call your pharmacy*  Lab Work: None ordered.  You may go to any [facility] Location for your lab work:  [facility] - [address] ([facility]) - [address] [phone] [address]  [place] - [address]   [place]  - [address]   [place] - [address] - [address] [employer]  - [address] [place] - [address] ([facility]   If you have labs (blood work) drawn today and your tests are completely normal, you will receive your results only by: [employer] (if you have MyChart)  If you have any lab test that is abnormal or we need to change your treatment, we will call you or send a MyChart message to review the results.  Testing/Procedures: None ordered.  Follow-Up: At [facility], you and your health needs are our priority.  As part of our continuing mission to provide you with exceptional heart care, we have created designated Provider Care Teams.  These Care Teams include your primary Cardiologist (physician) and Advanced Practice Providers (APPs -  Physician Assistants and Nurse Practitioners) who all work together to provide you with the care you need, when you need it.  Your next appointment:   February 2026  The format for your next appointment:   In Person  Provider:   Vina Gull, MD

## 2024-06-12 ENCOUNTER — Telehealth: Payer: Self-pay

## 2024-06-12 NOTE — Telephone Encounter (Signed)
-----   Message from Lisa [name] sent at 06/12/2024 11:19 AM EST ----- Patient should stop aspirin    She is on Pradaxa

## 2024-06-12 NOTE — Telephone Encounter (Signed)
 Spoke with patient and shared message from Dr. Okey to stop Aspirin . Patient verbalized understanding, medication removed from med list.

## 2024-06-14 ENCOUNTER — Encounter: Admitting: Physical Therapy

## 2024-06-16 ENCOUNTER — Ambulatory Visit: Attending: Family Medicine | Admitting: Physical Therapy

## 2024-06-16 ENCOUNTER — Encounter: Payer: Self-pay | Admitting: Physical Therapy

## 2024-06-16 ENCOUNTER — Encounter: Payer: Self-pay | Admitting: Internal Medicine

## 2024-06-16 DIAGNOSIS — M5459 Other low back pain: Secondary | ICD-10-CM | POA: Insufficient documentation

## 2024-06-16 DIAGNOSIS — M6281 Muscle weakness (generalized): Secondary | ICD-10-CM | POA: Diagnosis present

## 2024-06-16 DIAGNOSIS — R262 Difficulty in walking, not elsewhere classified: Secondary | ICD-10-CM | POA: Insufficient documentation

## 2024-06-16 NOTE — Therapy (Signed)
 OUTPATIENT PHYSICAL THERAPY THORACOLUMBAR PROGRESS NOTE   Patient Name: Lisa [name] MRN: [id number] DOB:04/16/1941, 83 y.o., female Today's Date: 06/16/2024  END OF SESSION:  PT End of Session - 06/16/24 1201     Visit Number 5    Date for Recertification  07/06/24    Authorization Type Medicare    Progress Note Due on Visit 10    PT Start Time 1200    PT Stop Time 1240    PT Time Calculation (min) 40 min    Activity Tolerance Patient tolerated treatment well;Patient limited by fatigue    Behavior During Therapy [facility] for tasks assessed/performed              Past Medical History:  Diagnosis Date   Acute on chronic systolic heart failure (HCC)    Acute renal failure superimposed on stage 3b chronic kidney disease (HCC) 05/22/2022   Bacteremia due to Pseudomonas 06/16/2022   CAD in native artery 2017   stent to LAD   Cardiac arrest (HCC) 09/2015   CHF (congestive heart failure) (HCC)    CML (chronic myelocytic leukemia) (HCC)    Complication of anesthesia    Hx: UTI (urinary tract infection)    Hypertension    Melanoma (HCC)    PONV (postoperative nausea and vomiting)    PVC (premature ventricular contraction)    HISTORY OF   Thyroid  disease    V tach (HCC)    Past Surgical History:  Procedure Laterality Date   ABDOMINAL HYSTERECTOMY     APPENDECTOMY     CARDIAC CATHETERIZATION N/A 10/03/2015   Procedure: Left Heart Cath and Coronary Angiography;  Surgeon: Peter M Jordan, MD;  Location: [facility] INVASIVE CV LAB;  Service: Cardiovascular;  Laterality: N/A;   CARDIAC CATHETERIZATION N/A 10/07/2015   Procedure: Coronary Stent Intervention;  Surgeon: Lonni JONETTA Cash, MD;  Location: [facility] INVASIVE CV LAB;  Service: Cardiovascular;  Laterality: N/A;   CARDIAC CATHETERIZATION  09/22/2019   EP IMPLANTABLE DEVICE N/A 01/20/2016   Procedure: BiV ICD Insertion CRT-D;  Surgeon: Danelle LELON Birmingham, MD;  Location: [facility] INVASIVE CV LAB;  Service: Cardiovascular;  Laterality: N/A;    ICD GENERATOR CHANGEOUT N/A 01/07/2024   Procedure: ICD GENERATOR CHANGEOUT;  Surgeon: Birmingham Danelle LELON, MD;  Location: [facility] INVASIVE CV LAB;  Service: Cardiovascular;  Laterality: N/A;   IR THORACENTESIS ASP PLEURAL SPACE W/IMG GUIDE  07/20/2023   IR THORACENTESIS ASP PLEURAL SPACE W/IMG GUIDE  12/02/2023   LEFT HEART CATH AND CORONARY ANGIOGRAPHY N/A 09/22/2019   Procedure: LEFT HEART CATH AND CORONARY ANGIOGRAPHY;  Surgeon: Jordan, Peter M, MD;  Location: [facility] INVASIVE CV LAB;  Service: Cardiovascular;  Laterality: N/A;   TONSILLECTOMY     TONSILLECTOMY     TUBAL LIGATION     Patient Active Problem List   Diagnosis Date Noted   ICD (implantable cardioverter-defibrillator) discharge 12/29/2023   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   Need for management of chest tube 12/06/2023   Hydropneumothorax 12/04/2023   Obesity, class 1 08/20/2023   Fall at home, initial encounter 08/09/2023   Generalized weakness 08/09/2023   Elevated troponin 08/09/2023   Anemia of chronic disease 08/09/2023   Acute on chronic diastolic heart failure (HCC) 08/08/2023   Cellulitis, leg 07/17/2023   Open wound of right lower extremity 07/17/2023   Respiratory distress 07/17/2023   Recurrent left pleural effusion 07/17/2023   Heart failure with reduced ejection fraction (HCC) 07/17/2023   Chronic kidney disease, stage III (moderate) (HCC) 07/17/2023   Obesity (  BMI 30-39.9) 07/17/2023   Urine culture positive 06/19/2022   Transaminitis 06/17/2022   Viral gastroenteritis 06/16/2022   Venous stasis dermatitis of both lower extremities 05/26/2022   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 05/22/2022   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/21/2022   Paroxysmal ventricular tachycardia (HCC) 09/21/2019   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   Unstable angina (HCC) 10/07/2015   Chronic systolic heart failure (HCC)    PVC's (premature ventricular contractions)    Ventricular fibrillation (HCC)  10/03/2015   History of cardiac arrest 10/03/2015   Essential hypertension 09/09/2015   Lymphedema 02/21/2015   Acquired hypothyroidism 02/21/2015    PCP: Claudene Pellet MD  REFERRING PROVIDER: Claudene Pellet MD  REFERRING DIAG: M54.50 LBP  Rationale for Evaluation and Treatment: Rehabilitation  THERAPY DIAG:  Other low back pain  Muscle weakness (generalized)  Difficulty in walking, not elsewhere classified  ONSET DATE: approx 1 year  SUBJECTIVE:                                                                                                                                                                                           SUBJECTIVE STATEMENT: There were no appointments so I couldn't get to the water  to exercise. I am discouraged with my situation: my back and hip hurt, I am tired, cannot walk far and I am wobbly.   PERTINENT HISTORY:  Leukemia oral chemo Pacemaker/debrillator CHF HTN Hospitalized in May for 1 week, fluid  2 falls in July: turning off light and tipped over on side;  the other fall was after got out of shower and turned and sat straight down (had to get son and caregiver helped get off the floor) caregiver 2x/week, alone 4 days/week HHPT 4-5 weeks walking with 4 WW, bands (red) for legs and arms  Blood clot in leg Lymphedema   PAIN:   Are you having pain? Yes NPRS scale: 4/10 with sitting; night time really bad  Pain location: bil lumbar Pain orientation: Bilateral  PAIN TYPE: aching Pain description: intermittent  Aggravating factors: night, early AM, lying down  Relieving factors: sleep with heating pad; Tylenol , Blue Emu, Tiger Balm  PRECAUTIONS: ICD/Pacemaker    WEIGHT BEARING RESTRICTIONS: fall  FALLS:  Has patient fallen in last 6 months? Yes. Number of falls 2  LIVING ENVIRONMENT: Lives with: lives alone Lives in: House/apartment Stairs: No, 3 steps to get in the door (getting better) Has following equipment at home:  Vannie - 4 wheeled, shower chair  OCCUPATION: psychologist, counselling, real estate, currently volunteer for [employer]  PLOF: Independent with basic ADLs  PATIENT GOALS: want to have hydrotherapy;  go as far as I can in mobility; walk without help but I'm too off balance; be more flexible; get in car easier without hands assisting legs; energy to walk around grocery store  NEXT MD VISIT: as needed  OBJECTIVE:  Note: Objective measures were completed at Evaluation unless otherwise noted.  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  2. Personal care (washing, dressing, etc.) 0 =  I can take care of myself normally without causing increased pain.  3. Lifting 3 = Pain prevents me from lifting heavy weights, but I can manage (5) I have hardly any social life because of my pain. light to medium weights if they are conveniently positioned  4. Walking 3 =  Pain prevents me from walking more than  mile.  5. Sitting 1 =  I can only sit in my favorite chair as long as I like.  6. Standing 3 =  Pain prevents me from standing more than 1/2 hour.  7. Sleeping 1 = I can sleep well only by using pain medication.  8. Social Life 1 =  My social life is normal, but it increases my level of pain.  9. Traveling 1 =  I can travel anywhere, but it increases my pain.  10. Employment/ Homemaking 2 = I can perform most of my homemaking/job duties, but pain prevents me from performing more physically stressful activities (eg, lifting, vacuuming).  Total 34%   Interpretation of scores: Score Category Description  0-20% Minimal Disability The patient can cope with most living activities. Usually no treatment is indicated apart from advice on lifting, sitting and exercise  21-40%  Moderate Disability The patient experiences more pain and difficulty with sitting, lifting and standing. Travel and social life are more difficult and they may be disabled from work. Personal care, sexual activity and sleeping are not grossly affected, and the patient can usually be managed by conservative means  41-60% Severe Disability Pain remains the main problem in this group, but activities of daily living are affected. These patients require a detailed investigation  61-80% Crippled Back pain impinges on all aspects of the patient's life. Positive intervention is required  81-100% Bed-bound  These patients are either bed-bound or exaggerating their symptoms  Bluford FORBES Zoe DELENA Karon DELENA, et al. Surgery versus conservative management of stable thoracolumbar fracture: the PRESTO feasibility RCT. [place] ([place]): [employer]; 2021 Nov. [facility] Technology Assessment, No. 25.62.) Appendix 3, Oswestry Disability Index category descriptors. Available from: Findjewelers.cz  Minimally Clinically Important Difference (MCID) = 12.8%  COGNITION: Overall cognitive status: Within functional limits for tasks assessed     MUSCLE LENGTH: Decreased HS and hip flexor lengths bil POSTURE: tends to stand forward flexed slightly, reports decreased back pain with standing erect  LUMBAR ROM:   AROM eval  Flexion 50  Extension 10  Right lateral flexion   Left lateral flexion   Right rotation   Left rotation    (Blank rows = not tested)  TRUNK STRENGTH:  Decreased activation of transverse abdominus muscles; abdominals 4-/5; decreased activation of lumbar multifidi; trunk extensors 4-/5   LOWER EXTREMITY ROM:   lacks full knee extension bil;  hip flexion 70 degrees, hip extension 5 degrees, unable to achieve seated figure 4 (external  rotation)  LOWER EXTREMITY MMT:  unable to rise from a standard height chair without significant UE assist; unable to SLS without  heavy UE assist  MMT Right eval Left eval  Hip flexion 4- 4-  Hip extension 4- 4-  Hip abduction 3+ 3+  Hip adduction    Hip internal rotation    Hip external rotation    Knee flexion 4- 4-  Knee extension 3+ 3+  Ankle dorsiflexion 4- 4-  Ankle plantarflexion 4- 4-  Ankle inversion    Ankle eversion     (Blank rows = not tested)   FUNCTIONAL TESTS:  5x STS 15.43 sec shortness of breath, low back pain TUG with 4 WW hands assist 24.63 sec back pain < 5 2 MWT 222 feet RPE 5-6/10  O2 98%; strain feeling in back   GAIT: Comments: decreased gait speed, using 4 [employer]  TREATMENT DATE:   06/16/24:Pt arrives for aquatic physical therapy. Treatment took place in 3.5-5.5 feet of water . Water  temperature was 91 degrees F. Pt entered the pool via stairs with extra time, step to pattern and heavy use of rails. Pt requires buoyancy of water  for support and to offload joints with strengthening exercises.  Seated at bench: VC to safely get into seated position. Allowed pt 2 min to relax, focus on breathing naturally/slowly in order to settle into the water  especially at her hips. Once settled: Knee kicks 1 min 2x, ankle pumps Bil 2x20, marching 20x2, hip abd/add 20x2  Standing at half wall (near where she was sitting) heel raises 15x, side steps Bil 15x, step back 15x each leg, mini squats 15x. All holding onto wall for balance.  Holding yellow noodle: walk from half wall to other side of the wall and back, VC for control and to start slow; sit to stand with yellow noodle x6 from bench: rest  Back stretch holding the rails: hold 2 sec 10x. Close SBA to exit pool at stairs, pt did not want the lift chair.    06/02/24:Pt arrives for aquatic physical therapy. Treatment took place in 3.5-5.5 feet of water . Water  temperature was 91 degrees F. Pt entered the pool via stairs with extra time, step to pattern and heavy use of rails. Pt requires buoyancy of water  for support and to offload joints with  strengthening exercises.  Seated at bench: VC to safely get into seated position. Allowed pt 2 min to relax, focus on breathing naturally/slowly in order to settle into the water  especially at her hips. Once settled: Knee kicks 1 min 2x, ankle pumps Bil 2x20, marching 15x,  Standing at half wall (near where she was sitting) heel raises 15x, side steps Bil 15x, step back 15x each leg, mini squats 15x. All holding onto wall for balance.  Holding yellow noodle: walk from half wall to other side of the water  bench: sit to rest about 30 sec, sit to stand  with yellow noodle2x6: rest:walk back towards lift chair and back to the bench. Rest and repeat walk. Back stretch holding the rails: hold 5 sec 4x. Close SBA to exit pool at stairs, pt did not want the lift chair.    05/26/24:Pt arrives for aquatic physical therapy. Treatment took place in 3.5-5.5 feet of water . Water  temperature was 91 degrees F. Pt entered the pool via stairs with extra time and heavy use of rails. Pt requires buoyancy of water  for support and to offload joints with strengthening exercises.  Seated at bench: VC to safely get into seated  position. Allowed pt 2 min to relax, focus on breathing naturally/slowly in order to settle into the water  especially at her hips. Once settled: Knee kicks 1 min 2x, ankle pumps Bil 2x20, marching 15x,  Standing at half wall (near where she was sitting) heel raises 10x, side steps Bil 10x, step back 10x each leg, mini squats 10x. All holding onto wall for balance.  Holding yellow noodle: walk from half wall to other side of the water  bench: sit to rest about 30 sec, stand up with yellow noodle and walk back to original seat at th ebench. She repeated this 2x. Sit to stand at bench holding yellow noodle 10x. Rest  Back stretch holding the rails: hold 5 sec 4x. Close SBA to exit pool at stairs, pt did not want the lift chair.                                                                                                              PATIENT EDUCATION:  Education details: Educated patient on anatomy and physiology of current symptoms, prognosis, plan of care as well as initial self care strategies to promote recovery Person educated: Patient Education method: Explanation Education comprehension: verbalized understanding  HOME EXERCISE PROGRAM: To be started  ASSESSMENT:  CLINICAL IMPRESSION: pt has missed aquatic PT due to not being able to get an appointment. Pt has regressed some specifically in her walking tolerance and hip pain. We discussed the role and benefit  of consistent exercise and perhaps missing exercise in the water  has affected her. Seated exercises today were excellent, in fact increasing her reps slightly but standing was difficult in using the LT LE and she got very fatigued. Pt's balance was definitely not as stable as she had been. She may benefit from CGA next aquatic session.  OBJECTIVE IMPAIRMENTS: decreased activity tolerance, decreased balance, decreased mobility, difficulty walking, decreased ROM, decreased strength, impaired perceived functional ability, impaired flexibility, and pain.   ACTIVITY LIMITATIONS: carrying, lifting, bending, standing, squatting, sleeping, stairs, transfers, hygiene/grooming, and locomotion level  PARTICIPATION LIMITATIONS: meal prep, cleaning, laundry, driving, shopping, and community activity  PERSONAL FACTORS: Time since onset of injury/illness/exacerbation and 3+ comorbidities: cardiac history, leukemia, lymphedema, HTN are also affecting patient's functional outcome.   REHAB POTENTIAL: Good  CLINICAL DECISION MAKING: Evolving/moderate complexity  EVALUATION COMPLEXITY: Moderate   GOALS: Goals reviewed with patient? Yes  SHORT TERM GOALS: Target date: 06/08/2024    The patient will demonstrate knowledge of basic self care strategies and exercises to promote healing  Baseline: Goal status: INITIAL  2.  The patient will  report a 30% improvement in pain levels with functional activities which are currently difficult including night time and in the mornings Baseline:  Goal status: INITIAL  3.  The patient will have improved Timed Up and Go (TUG) time to    20 sec   sec indicating improved gait speed and LE strength  Baseline:  Goal status: INITIAL  4.  The patient will be able to ambulate 350 feet in 3 minutes needed  for short community walking, medical appts Baseline:  Goal status: INITIAL  5.  Improved LE strength improved as indicated by 5x STS test to 14 sec with min UE assist Baseline:  Goal status: INITIAL    LONG TERM GOALS: Target date: 07/06/2024    The patient will be independent in a safe self progression of a home exercise program to promote further recovery of function  Baseline:  Goal status: INITIAL  2.  The patient will report a 30% improvement in pain levels with functional activities which are currently difficult including night time and in the mornings Baseline:  Goal status: INITIAL  3.  The patient will have improved gait stamina and speed needed to ambulate 600 feet in 6 minutes  Baseline:  Goal status: INITIAL  4.  The patient will have improved Timed Up and Go (TUG) time to   17   sec indicating improved gait speed, decreased risk of falls and LE strength  Baseline:  Goal status: INITIAL  5.  Modified Oswestry functional outcome measure score improved to   26  % indicating improved function with ADLS with less pain.   Baseline:  Goal status: INITIAL   PLAN:  PT FREQUENCY: 2x/week  PT DURATION: 8 weeks  PLANNED INTERVENTIONS: 97164- PT Re-evaluation, 97750- Physical Performance Testing, 97110-Therapeutic exercises, 97530- Therapeutic activity, [id number]- Neuromuscular re-education, 97535- Self Care, [id number]- Manual therapy, [phone]- Aquatic Therapy, [phone] (1-2 muscles), [id number] (3+ muscles)- Dry Needling, Patient/Family education, Balance training, Stair training,  Taping, Joint mobilization, Cryotherapy, and Moist heat.  PLAN FOR NEXT SESSION: Slow progression of aquatic exercises; consider adding some hip circumduction movements.  No ES (pacemaker/defibrillator)  Delon Darner, PTA 06/16/24 12:44 PM   Phone: [phone] Fax: [phone]

## 2024-06-19 ENCOUNTER — Ambulatory Visit: Attending: Internal Medicine

## 2024-06-19 DIAGNOSIS — I5022 Chronic systolic (congestive) heart failure: Secondary | ICD-10-CM | POA: Diagnosis not present

## 2024-06-19 DIAGNOSIS — Z95810 Presence of automatic (implantable) cardiac defibrillator: Secondary | ICD-10-CM

## 2024-06-21 ENCOUNTER — Encounter (HOSPITAL_BASED_OUTPATIENT_CLINIC_OR_DEPARTMENT_OTHER): Payer: Self-pay | Admitting: Physical Therapy

## 2024-06-21 ENCOUNTER — Encounter: Admitting: Physical Therapy

## 2024-06-21 ENCOUNTER — Encounter (HOSPITAL_BASED_OUTPATIENT_CLINIC_OR_DEPARTMENT_OTHER): Attending: Family Medicine | Admitting: Physical Therapy

## 2024-06-21 DIAGNOSIS — M6281 Muscle weakness (generalized): Secondary | ICD-10-CM | POA: Insufficient documentation

## 2024-06-21 DIAGNOSIS — M5459 Other low back pain: Secondary | ICD-10-CM | POA: Insufficient documentation

## 2024-06-21 DIAGNOSIS — R262 Difficulty in walking, not elsewhere classified: Secondary | ICD-10-CM | POA: Diagnosis present

## 2024-06-21 NOTE — Therapy (Signed)
 OUTPATIENT PHYSICAL THERAPY THORACOLUMBAR TREATMENT   Patient Name: Lisa [name] MRN: [id number] DOB:10/29/1940, 83 y.o., female Today's Date: 06/21/2024  END OF SESSION:  PT End of Session - 06/21/24 1332     Visit Number 6    Date for Recertification  07/06/24    Authorization Type Medicare    Progress Note Due on Visit 10    PT Start Time 1315    PT Stop Time 1355    PT Time Calculation (min) 40 min    Activity Tolerance Patient tolerated treatment well;Patient limited by fatigue    Behavior During Therapy [facility] for tasks assessed/performed               Past Medical History:  Diagnosis Date   Acute on chronic systolic heart failure (HCC)    Acute renal failure superimposed on stage 3b chronic kidney disease (HCC) 05/22/2022   Bacteremia due to Pseudomonas 06/16/2022   CAD in native artery 2017   stent to LAD   Cardiac arrest (HCC) 09/2015   CHF (congestive heart failure) (HCC)    CML (chronic myelocytic leukemia) (HCC)    Complication of anesthesia    Hx: UTI (urinary tract infection)    Hypertension    Melanoma (HCC)    PONV (postoperative nausea and vomiting)    PVC (premature ventricular contraction)    HISTORY OF   Thyroid  disease    V tach (HCC)    Past Surgical History:  Procedure Laterality Date   ABDOMINAL HYSTERECTOMY     APPENDECTOMY     CARDIAC CATHETERIZATION N/A 10/03/2015   Procedure: Left Heart Cath and Coronary Angiography;  Surgeon: Peter M Jordan, MD;  Location: [facility] INVASIVE CV LAB;  Service: Cardiovascular;  Laterality: N/A;   CARDIAC CATHETERIZATION N/A 10/07/2015   Procedure: Coronary Stent Intervention;  Surgeon: Lonni JONETTA Cash, MD;  Location: [facility] INVASIVE CV LAB;  Service: Cardiovascular;  Laterality: N/A;   CARDIAC CATHETERIZATION  09/22/2019   EP IMPLANTABLE DEVICE N/A 01/20/2016   Procedure: BiV ICD Insertion CRT-D;  Surgeon: Danelle LELON Birmingham, MD;  Location: [facility] INVASIVE CV LAB;  Service: Cardiovascular;  Laterality: N/A;    ICD GENERATOR CHANGEOUT N/A 01/07/2024   Procedure: ICD GENERATOR CHANGEOUT;  Surgeon: Birmingham Danelle LELON, MD;  Location: [facility] INVASIVE CV LAB;  Service: Cardiovascular;  Laterality: N/A;   IR THORACENTESIS ASP PLEURAL SPACE W/IMG GUIDE  07/20/2023   IR THORACENTESIS ASP PLEURAL SPACE W/IMG GUIDE  12/02/2023   LEFT HEART CATH AND CORONARY ANGIOGRAPHY N/A 09/22/2019   Procedure: LEFT HEART CATH AND CORONARY ANGIOGRAPHY;  Surgeon: Jordan, Peter M, MD;  Location: [facility] INVASIVE CV LAB;  Service: Cardiovascular;  Laterality: N/A;   TONSILLECTOMY     TONSILLECTOMY     TUBAL LIGATION     Patient Active Problem List   Diagnosis Date Noted   ICD (implantable cardioverter-defibrillator) discharge 12/29/2023   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   Need for management of chest tube 12/06/2023   Hydropneumothorax 12/04/2023   Obesity, class 1 08/20/2023   Fall at home, initial encounter 08/09/2023   Generalized weakness 08/09/2023   Elevated troponin 08/09/2023   Anemia of chronic disease 08/09/2023   Acute on chronic diastolic heart failure (HCC) 08/08/2023   Cellulitis, leg 07/17/2023   Open wound of right lower extremity 07/17/2023   Respiratory distress 07/17/2023   Recurrent left pleural effusion 07/17/2023   Heart failure with reduced ejection fraction (HCC) 07/17/2023   Chronic kidney disease, stage III (moderate) (HCC) 07/17/2023   Obesity (  BMI 30-39.9) 07/17/2023   Urine culture positive 06/19/2022   Transaminitis 06/17/2022   Viral gastroenteritis 06/16/2022   Venous stasis dermatitis of both lower extremities 05/26/2022   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 05/22/2022   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/21/2022   Paroxysmal ventricular tachycardia (HCC) 09/21/2019   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   Unstable angina (HCC) 10/07/2015   Chronic systolic heart failure (HCC)    PVC's (premature ventricular contractions)    Ventricular fibrillation (HCC) 10/03/2015    History of cardiac arrest 10/03/2015   Essential hypertension 09/09/2015   Lymphedema 02/21/2015   Acquired hypothyroidism 02/21/2015    PCP: Claudene Pellet MD  REFERRING PROVIDER: Claudene Pellet MD  REFERRING DIAG: M54.50 LBP  Rationale for Evaluation and Treatment: Rehabilitation  THERAPY DIAG:  Other low back pain  Muscle weakness (generalized)  Difficulty in walking, not elsewhere classified  ONSET DATE: approx 1 year  SUBJECTIVE:                                                                                                                                                                                           SUBJECTIVE STATEMENT: There were no appointments so I couldn't get to the water  to exercise. I am discouraged with my situation: my back and hip hurt, I am tired, cannot walk far and I am wobbly.   PERTINENT HISTORY:  Leukemia oral chemo Pacemaker/debrillator CHF HTN Hospitalized in May for 1 week, fluid  2 falls in July: turning off light and tipped over on side;  the other fall was after got out of shower and turned and sat straight down (had to get son and caregiver helped get off the floor) caregiver 2x/week, alone 4 days/week HHPT 4-5 weeks walking with 4 WW, bands (red) for legs and arms  Blood clot in leg Lymphedema   PAIN:   Are you having pain? Yes NPRS scale: 4/10 with sitting; night time really bad  Pain location: bil lumbar Pain orientation: Bilateral  PAIN TYPE: aching Pain description: intermittent  Aggravating factors: night, early AM, lying down  Relieving factors: sleep with heating pad; Tylenol , Blue Emu, Tiger Balm  PRECAUTIONS: ICD/Pacemaker    WEIGHT BEARING RESTRICTIONS: fall  FALLS:  Has patient fallen in last 6 months? Yes. Number of falls 2  LIVING ENVIRONMENT: Lives with: lives alone Lives in: House/apartment Stairs: No, 3 steps to get in the door (getting better) Has following equipment at home: Vannie - 4  wheeled, shower chair  OCCUPATION: psychologist, counselling, real estate, currently volunteer for [employer]  PLOF: Independent with basic ADLs  PATIENT GOALS: want to have hydrotherapy;  go as far as I can in mobility; walk without help but I'm too off balance; be more flexible; get in car easier without hands assisting legs; energy to walk around grocery store  NEXT MD VISIT: as needed  OBJECTIVE:  Note: Objective measures were completed at Evaluation unless otherwise noted.  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  2. Personal care (washing, dressing, etc.) 0 =  I can take care of myself normally without causing increased pain.  3. Lifting 3 = Pain prevents me from lifting heavy weights, but I can manage (5) I have hardly any social life because of my pain. light to medium weights if they are conveniently positioned  4. Walking 3 =  Pain prevents me from walking more than  mile.  5. Sitting 1 =  I can only sit in my favorite chair as long as I like.  6. Standing 3 =  Pain prevents me from standing more than 1/2 hour.  7. Sleeping 1 = I can sleep well only by using pain medication.  8. Social Life 1 =  My social life is normal, but it increases my level of pain.  9. Traveling 1 =  I can travel anywhere, but it increases my pain.  10. Employment/ Homemaking 2 = I can perform most of my homemaking/job duties, but pain prevents me from performing more physically stressful activities (eg, lifting, vacuuming).  Total 34%   Interpretation of scores: Score Category Description  0-20% Minimal Disability The patient can cope with most living activities. Usually no treatment is indicated apart from advice on lifting, sitting and exercise  21-40% Moderate  Disability The patient experiences more pain and difficulty with sitting, lifting and standing. Travel and social life are more difficult and they may be disabled from work. Personal care, sexual activity and sleeping are not grossly affected, and the patient can usually be managed by conservative means  41-60% Severe Disability Pain remains the main problem in this group, but activities of daily living are affected. These patients require a detailed investigation  61-80% Crippled Back pain impinges on all aspects of the patient's life. Positive intervention is required  81-100% Bed-bound  These patients are either bed-bound or exaggerating their symptoms  Bluford FORBES Zoe DELENA Karon DELENA, et al. Surgery versus conservative management of stable thoracolumbar fracture: the PRESTO feasibility RCT. [place] ([place]): [employer]; 2021 Nov. [facility] Technology Assessment, No. 25.62.) Appendix 3, Oswestry Disability Index category descriptors. Available from: Findjewelers.cz  Minimally Clinically Important Difference (MCID) = 12.8%  COGNITION: Overall cognitive status: Within functional limits for tasks assessed     MUSCLE LENGTH: Decreased HS and hip flexor lengths bil POSTURE: tends to stand forward flexed slightly, reports decreased back pain with standing erect  LUMBAR ROM:   AROM eval  Flexion 50  Extension 10  Right lateral flexion   Left lateral flexion   Right rotation   Left rotation    (Blank rows = not tested)  TRUNK STRENGTH:  Decreased activation of transverse abdominus muscles; abdominals 4-/5; decreased activation of lumbar multifidi; trunk extensors 4-/5   LOWER EXTREMITY ROM:   lacks full knee extension bil;  hip flexion 70 degrees, hip extension 5 degrees, unable to achieve seated figure 4 (external  rotation)  LOWER EXTREMITY MMT:  unable to rise from a standard height chair without significant UE assist; unable to SLS without heavy UE  assist  MMT Right eval Left eval  Hip flexion 4- 4-  Hip extension 4- 4-  Hip abduction 3+ 3+  Hip adduction    Hip internal rotation    Hip external rotation    Knee flexion 4- 4-  Knee extension 3+ 3+  Ankle dorsiflexion 4- 4-  Ankle plantarflexion 4- 4-  Ankle inversion    Ankle eversion     (Blank rows = not tested)   FUNCTIONAL TESTS:  5x STS 15.43 sec shortness of breath, low back pain TUG with 4 WW hands assist 24.63 sec back pain < 5 2 MWT 222 feet RPE 5-6/10  O2 98%; strain feeling in back   GAIT: Comments: decreased gait speed, using 4 [employer]  TREATMENT DATE:  [facility] Adult PT Treatment:                                                DATE: 06/21/24 Pt seen for aquatic therapy today.  Treatment took place in water  3.5-4.75 ft in depth at the [facility] pool. Temp of water  was 91.  Pt entered the pool via stairs step to pattern forward 3 steps then backward x 3 steps with heavy ue support on hand rail and CGA  -seated on bench: LAQ; cycling; marching; hip add/abd; ankle ROM -Standing at half wall: toe raises x 15; heel raises x 10; hip add/abd 2 x 5 reps  - seated rest period -Side stepping R/L x 10 ft ue support barbell along pool side x 6 laps -Ue support wall 3.8 ft: high knee marching; hip extension 2 x 5 reps -forward amb using barbell  4 laps across pool with short rest period on each side Stair negotiation out of pool using step to pattern leading with rle.  VC for forward weigth shift engaging right hip to reduce ue support on hand rails.  Good execution, still requires heavy ue support as submersion is reduced (last 3 steps) and CGA.   Pt requires the buoyancy and hydrostatic pressure of water  for support, and to offload joints by unweighting joint load by at least 50 % in navel deep water  and by at least 75-80% in chest to neck deep water .  Viscosity of the water  is needed for resistance of strengthening. Water  current perturbations provides challenge to  standing balance requiring increased core activation.     06/16/24:Pt arrives for aquatic physical therapy. Treatment took place in 3.5-5.5 feet of water . Water  temperature was 91 degrees F. Pt entered the pool via stairs with extra time, step to pattern and heavy use of rails. Pt requires buoyancy of water  for support and to offload joints with strengthening exercises.  Seated at bench: VC to safely get into seated position. Allowed pt 2 min to relax, focus on breathing naturally/slowly in order to settle into the water  especially at her hips. Once settled: Knee kicks 1 min 2x, ankle pumps Bil 2x20, marching 20x2, hip abd/add 20x2  Standing at half wall (near where she was sitting) heel raises 15x, side steps Bil 15x, step back 15x each leg, mini squats 15x. All holding onto wall for balance.  Holding yellow noodle: walk from half wall to other side of the wall and back,  VC for control and to start slow; sit to stand with yellow noodle x6 from bench: rest  Back stretch holding the rails: hold 2 sec 10x. Close SBA to exit pool at stairs, pt did not want the lift chair.    06/02/24:Pt arrives for aquatic physical therapy. Treatment took place in 3.5-5.5 feet of water . Water  temperature was 91 degrees F. Pt entered the pool via stairs with extra time, step to pattern and heavy use of rails. Pt requires buoyancy of water  for support and to offload joints with strengthening exercises.  Seated at bench: VC to safely get into seated position. Allowed pt 2 min to relax, focus on breathing naturally/slowly in order to settle into the water  especially at her hips. Once settled: Knee kicks 1 min 2x, ankle pumps Bil 2x20, marching 15x,  Standing at half wall (near where she was sitting) heel raises 15x, side steps Bil 15x, step back 15x each leg, mini squats 15x. All holding onto wall for balance.  Holding yellow noodle: walk from half wall to other side of the water  bench: sit to rest about 30 sec, sit to stand   with yellow noodle2x6: rest:walk back towards lift chair and back to the bench. Rest and repeat walk. Back stretch holding the rails: hold 5 sec 4x. Close SBA to exit pool at stairs, pt did not want the lift chair.    05/26/24:Pt arrives for aquatic physical therapy. Treatment took place in 3.5-5.5 feet of water . Water  temperature was 91 degrees F. Pt entered the pool via stairs with extra time and heavy use of rails. Pt requires buoyancy of water  for support and to offload joints with strengthening exercises.  Seated at bench: VC to safely get into seated position. Allowed pt 2 min to relax, focus on breathing naturally/slowly in order to settle into the water  especially at her hips. Once settled: Knee kicks 1 min 2x, ankle pumps Bil 2x20, marching 15x,  Standing at half wall (near where she was sitting) heel raises 10x, side steps Bil 10x, step back 10x each leg, mini squats 10x. All holding onto wall for balance.  Holding yellow noodle: walk from half wall to other side of the water  bench: sit to rest about 30 sec, stand up with yellow noodle and walk back to original seat at th ebench. She repeated this 2x. Sit to stand at bench holding yellow noodle 10x. Rest  Back stretch holding the rails: hold 5 sec 4x. Close SBA to exit pool at stairs, pt did not want the lift chair.                                                                                                             PATIENT EDUCATION:  Education details: Educated patient on anatomy and physiology of current symptoms, prognosis, plan of care as well as initial self care strategies to promote recovery Person educated: Patient Education method: Explanation Education comprehension: verbalized understanding  HOME EXERCISE PROGRAM: To be started  ASSESSMENT:  CLINICAL IMPRESSION:  Progressed gait and gait  tolerance walking across 3.6 ft using barbell for ue support.  Initially pt unsteady but with repetition she becomes increasingly  confident and dynamic balance improves.  She is given short rest periods on each end. Pt with difficulty negotiating stairs.  VC and cga improves execution and safety.  She is able to go down last 3 steps backward with improved safety. Pt pleased with progression today. Goals ongoing    OBJECTIVE IMPAIRMENTS: decreased activity tolerance, decreased balance, decreased mobility, difficulty walking, decreased ROM, decreased strength, impaired perceived functional ability, impaired flexibility, and pain.   ACTIVITY LIMITATIONS: carrying, lifting, bending, standing, squatting, sleeping, stairs, transfers, hygiene/grooming, and locomotion level  PARTICIPATION LIMITATIONS: meal prep, cleaning, laundry, driving, shopping, and community activity  PERSONAL FACTORS: Time since onset of injury/illness/exacerbation and 3+ comorbidities: cardiac history, leukemia, lymphedema, HTN are also affecting patient's functional outcome.   REHAB POTENTIAL: Good  CLINICAL DECISION MAKING: Evolving/moderate complexity  EVALUATION COMPLEXITY: Moderate   GOALS: Goals reviewed with patient? Yes  SHORT TERM GOALS: Target date: 06/08/2024    The patient will demonstrate knowledge of basic self care strategies and exercises to promote healing  Baseline: Goal status: INITIAL  2.  The patient will report a 30% improvement in pain levels with functional activities which are currently difficult including night time and in the mornings Baseline:  Goal status: INITIAL  3.  The patient will have improved Timed Up and Go (TUG) time to    20 sec   sec indicating improved gait speed and LE strength  Baseline:  Goal status: INITIAL  4.  The patient will be able to ambulate 350 feet in 3 minutes needed for short community walking, medical appts Baseline:  Goal status: INITIAL  5.  Improved LE strength improved as indicated by 5x STS test to 14 sec with min UE assist Baseline:  Goal status: INITIAL    LONG TERM  GOALS: Target date: 07/06/2024    The patient will be independent in a safe self progression of a home exercise program to promote further recovery of function  Baseline:  Goal status: INITIAL  2.  The patient will report a 30% improvement in pain levels with functional activities which are currently difficult including night time and in the mornings Baseline:  Goal status: INITIAL  3.  The patient will have improved gait stamina and speed needed to ambulate 600 feet in 6 minutes  Baseline:  Goal status: INITIAL  4.  The patient will have improved Timed Up and Go (TUG) time to   17   sec indicating improved gait speed, decreased risk of falls and LE strength  Baseline:  Goal status: INITIAL  5.  Modified Oswestry functional outcome measure score improved to   26  % indicating improved function with ADLS with less pain.   Baseline:  Goal status: INITIAL   PLAN:  PT FREQUENCY: 2x/week  PT DURATION: 8 weeks  PLANNED INTERVENTIONS: 97164- PT Re-evaluation, 97750- Physical Performance Testing, 97110-Therapeutic exercises, 97530- Therapeutic activity, [id number]- Neuromuscular re-education, 97535- Self Care, [id number]- Manual therapy, [phone]- Aquatic Therapy, [phone] (1-2 muscles), 20561 (3+ muscles)- Dry Needling, Patient/Family education, Balance training, Stair training, Taping, Joint mobilization, Cryotherapy, and Moist heat.  PLAN FOR NEXT SESSION: Slow progression of aquatic exercises; consider adding some hip circumduction movements.  No ES psychologist, forensic)  Ronal St. Stephens) Dory Demont MPT 06/21/24 1:33 PM [facility] [address] [place], [place], [address] Phone: [phone]   Fax:  [phone]

## 2024-06-23 ENCOUNTER — Ambulatory Visit: Admitting: Physical Therapy

## 2024-06-23 NOTE — Progress Notes (Signed)
 EPIC Encounter for ICM Monitoring  Patient Name: Lisa [name] is a 83 y.o. female Date: 06/23/2024 Primary Care Physican: Claudene Pellet, MD Primary Cardiologist: Okey Electrophysiologist: Waddell Pore Pacing: 97.4%         11/12/2021 Weight: 248 lbs 01/21/2022 Weight: 246 lbs 09/01/2022 Office Weight: 205 lbs 04/23/2023 Office Weight: 206 lbs 09/02/2023 Weight: 170 lbs 09/14/2023 Weight: 172 lbs 03/02/2024 Weight: 141.8 lbs  04/06/2024 Weight: 131.4 lbs       06/09/2024 Office Weight: 119 lbs                                            Transmission results reviewed.    Diet:  Reviews labels for salt contents and buys low salt foods.     Since 05/08/2024 ICM Remote Transmission: Optivol thoracic impedance suggesting normal fluid levels.    Prescribed:  Torsemide  20 mg take 1 tablet(s) (20 mg total) by mouth daily.   11/16/2023 She reports Dr Waddell advised, at 11/01/2023 OV, she can take an extra Torsemide  about 1-2 times a week if needed.     Labs: 06/15/2024 Creatinine 1.57, BUN 36, Potassium 4.3, Sodium 134, GFR 33 06/05/2024 Creatinine 1.45, BUN 33, Potassium 4.5, Sodium 135, GFR 36 03/02/2024 Creatinine 1.84, BUN 26, Potassium 4.2, Sodium 133, GFR 27  01/08/2024 Creatinine 1.47, BUN 18, Potassium 4.4, Sodium 134, GFR 36  01/07/2024 Creatinine 1.43, BUN 19, Potassium 4.4, Sodium 134, GFR 37  01/06/2024 Creatinine 1.54, BUN 21, Potassium 3.3, Sodium 137, GFR 34 01/05/2024 Creatinine 1.54, BUN 23, Potassium 3.8, Sodium 134, GFR 34  A complete set of results can be found in Results Review.   Recommendations: No changes.    Follow-up plan: ICM clinic phone appointment on 07/24/2024.   91 day device clinic remote transmission 08/29/2024.     EP/Cardiology Office Visits:  Recall 09/19/2024 with Dr Almetta.     Copy of ICM check sent to Dr. Waddell.    Remote monitoring is medically necessary for Heart Failure Management.    Daily Thoracic Impedance ICM trend: 03/20/2024 through  06/19/2024.    12-14 Month Thoracic Impedance ICM trend:     Mitzie GORMAN Garner, RN 06/23/2024 10:59 AM

## 2024-06-28 ENCOUNTER — Encounter: Admitting: Physical Therapy

## 2024-06-30 ENCOUNTER — Encounter: Payer: Self-pay | Admitting: Physical Therapy

## 2024-06-30 ENCOUNTER — Ambulatory Visit: Admitting: Physical Therapy

## 2024-06-30 DIAGNOSIS — R262 Difficulty in walking, not elsewhere classified: Secondary | ICD-10-CM

## 2024-06-30 DIAGNOSIS — M5459 Other low back pain: Secondary | ICD-10-CM

## 2024-06-30 DIAGNOSIS — M6281 Muscle weakness (generalized): Secondary | ICD-10-CM

## 2024-06-30 NOTE — Therapy (Signed)
 OUTPATIENT PHYSICAL THERAPY THORACOLUMBAR TREATMENT   Patient Name: Lisa [name] MRN: [id number] DOB:[birth year], 83 y.o., female Today's Date: 06/30/2024  END OF SESSION:  PT End of Session - 06/30/24 1202     Visit Number 7    Date for Recertification  07/06/24    Authorization Type Medicare    Progress Note Due on Visit 10    PT Start Time 1202    PT Stop Time 1245    PT Time Calculation (min) 43 min    Activity Tolerance Patient tolerated treatment well;Patient limited by pain    Behavior During Therapy [facility] for tasks assessed/performed                Past Medical History:  Diagnosis Date   Acute on chronic systolic heart failure (HCC)    Acute renal failure superimposed on stage 3b chronic kidney disease (HCC) 05/22/2022   Bacteremia due to Pseudomonas 06/16/2022   CAD in native artery 2017   stent to LAD   Cardiac arrest (HCC) 09/2015   CHF (congestive heart failure) (HCC)    CML (chronic myelocytic leukemia) (HCC)    Complication of anesthesia    Hx: UTI (urinary tract infection)    Hypertension    Melanoma (HCC)    PONV (postoperative nausea and vomiting)    PVC (premature ventricular contraction)    HISTORY OF   Thyroid  disease    V tach (HCC)    Past Surgical History:  Procedure Laterality Date   ABDOMINAL HYSTERECTOMY     APPENDECTOMY     CARDIAC CATHETERIZATION N/A 10/03/2015   Procedure: Left Heart Cath and Coronary Angiography;  Surgeon: Peter M Jordan, MD;  Location: [facility] INVASIVE CV LAB;  Service: Cardiovascular;  Laterality: N/A;   CARDIAC CATHETERIZATION N/A 10/07/2015   Procedure: Coronary Stent Intervention;  Surgeon: Lonni JONETTA Cash, MD;  Location: [facility] INVASIVE CV LAB;  Service: Cardiovascular;  Laterality: N/A;   CARDIAC CATHETERIZATION  09/22/2019   EP IMPLANTABLE DEVICE N/A 01/20/2016   Procedure: BiV ICD Insertion CRT-D;  Surgeon: Danelle LELON Birmingham, MD;  Location: [facility] INVASIVE CV LAB;  Service: Cardiovascular;  Laterality: N/A;    ICD GENERATOR CHANGEOUT N/A 01/07/2024   Procedure: ICD GENERATOR CHANGEOUT;  Surgeon: Birmingham Danelle LELON, MD;  Location: [facility] INVASIVE CV LAB;  Service: Cardiovascular;  Laterality: N/A;   IR THORACENTESIS ASP PLEURAL SPACE W/IMG GUIDE  07/20/2023   IR THORACENTESIS ASP PLEURAL SPACE W/IMG GUIDE  12/02/2023   LEFT HEART CATH AND CORONARY ANGIOGRAPHY N/A 09/22/2019   Procedure: LEFT HEART CATH AND CORONARY ANGIOGRAPHY;  Surgeon: Jordan, Peter M, MD;  Location: [facility] INVASIVE CV LAB;  Service: Cardiovascular;  Laterality: N/A;   TONSILLECTOMY     TONSILLECTOMY     TUBAL LIGATION     Patient Active Problem List   Diagnosis Date Noted   ICD (implantable cardioverter-defibrillator) discharge 12/29/2023   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   Need for management of chest tube 12/06/2023   Hydropneumothorax 12/04/2023   Obesity, class 1 08/20/2023   Fall at home, initial encounter 08/09/2023   Generalized weakness 08/09/2023   Elevated troponin 08/09/2023   Anemia of chronic disease 08/09/2023   Acute on chronic diastolic heart failure (HCC) 08/08/2023   Cellulitis, leg 07/17/2023   Open wound of right lower extremity 07/17/2023   Respiratory distress 07/17/2023   Recurrent left pleural effusion 07/17/2023   Heart failure with reduced ejection fraction (HCC) 07/17/2023   Chronic kidney disease, stage III (moderate) (HCC) 07/17/2023  Obesity (BMI 30-39.9) 07/17/2023   Urine culture positive 06/19/2022   Transaminitis 06/17/2022   Viral gastroenteritis 06/16/2022   Venous stasis dermatitis of both lower extremities 05/26/2022   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 05/22/2022   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/21/2022   Paroxysmal ventricular tachycardia (HCC) 09/21/2019   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   Unstable angina (HCC) 10/07/2015   Chronic systolic heart failure (HCC)    PVC's (premature ventricular contractions)    Ventricular fibrillation (HCC) 10/03/2015    History of cardiac arrest 10/03/2015   Essential hypertension 09/09/2015   Lymphedema 02/21/2015   Acquired hypothyroidism 02/21/2015    PCP: Claudene Pellet MD  REFERRING PROVIDER: Claudene Pellet MD  REFERRING DIAG: M54.50 LBP  Rationale for Evaluation and Treatment: Rehabilitation  THERAPY DIAG:  Other low back pain  Muscle weakness (generalized)  Difficulty in walking, not elsewhere classified  ONSET DATE: approx 1 year  SUBJECTIVE:                                                                                                                                                                                           SUBJECTIVE STATEMENT: Pt ambulates into the pool area with antalgic gait. She reports falling the other day at home  I just fell backwards. Her back is very sore today but stays motivated to exercise.   PERTINENT HISTORY:  Leukemia oral chemo Pacemaker/debrillator CHF HTN Hospitalized in May for 1 week, fluid  2 falls in July: turning off light and tipped over on side;  the other fall was after got out of shower and turned and sat straight down (had to get son and caregiver helped get off the floor) caregiver 2x/week, alone 4 days/week HHPT 4-5 weeks walking with 4 WW, bands (red) for legs and arms  Blood clot in leg Lymphedema   PAIN:   Are you having pain? Yes NPRS scale: 5-6/10 with sitting; night time really bad  Pain location: bil lumbar Pain orientation: Bilateral  PAIN TYPE: aching Pain description: intermittent  Aggravating factors: night, early AM, lying down  Relieving factors: sleep with heating pad; Tylenol , Blue Emu, Tiger Balm  PRECAUTIONS: ICD/Pacemaker    WEIGHT BEARING RESTRICTIONS: fall  FALLS:  Has patient fallen in last 6 months? Yes. Number of falls 2  LIVING ENVIRONMENT: Lives with: lives alone Lives in: House/apartment Stairs: No, 3 steps to get in the door (getting better) Has following equipment at home: Vannie  - 4 wheeled, shower chair  OCCUPATION: psychologist, counselling, real estate, currently volunteer for [employer]  PLOF: Independent with basic ADLs  PATIENT GOALS: want to have hydrotherapy;  go as far as I can in mobility; walk without help but I'm too off balance; be more flexible; get in car easier without hands assisting legs; energy to walk around grocery store  NEXT MD VISIT: as needed  OBJECTIVE:  Note: Objective measures were completed at Evaluation unless otherwise noted.  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  2. Personal care (washing, dressing, etc.) 0 =  I can take care of myself normally without causing increased pain.  3. Lifting 3 = Pain prevents me from lifting heavy weights, but I can manage (5) I have hardly any social life because of my pain. light to medium weights if they are conveniently positioned  4. Walking 3 =  Pain prevents me from walking more than  mile.  5. Sitting 1 =  I can only sit in my favorite chair as long as I like.  6. Standing 3 =  Pain prevents me from standing more than 1/2 hour.  7. Sleeping 1 = I can sleep well only by using pain medication.  8. Social Life 1 =  My social life is normal, but it increases my level of pain.  9. Traveling 1 =  I can travel anywhere, but it increases my pain.  10. Employment/ Homemaking 2 = I can perform most of my homemaking/job duties, but pain prevents me from performing more physically stressful activities (eg, lifting, vacuuming).  Total 34%   Interpretation of scores: Score Category Description  0-20% Minimal Disability The patient can cope with most living activities. Usually no treatment is indicated apart from advice on lifting, sitting and exercise  21-40%  Moderate Disability The patient experiences more pain and difficulty with sitting, lifting and standing. Travel and social life are more difficult and they may be disabled from work. Personal care, sexual activity and sleeping are not grossly affected, and the patient can usually be managed by conservative means  41-60% Severe Disability Pain remains the main problem in this group, but activities of daily living are affected. These patients require a detailed investigation  61-80% Crippled Back pain impinges on all aspects of the patient's life. Positive intervention is required  81-100% Bed-bound  These patients are either bed-bound or exaggerating their symptoms  Bluford FORBES Zoe DELENA Karon DELENA, et al. Surgery versus conservative management of stable thoracolumbar fracture: the PRESTO feasibility RCT. [place] ([place]): [employer]; 2021 Nov. [facility] Technology Assessment, No. 25.62.) Appendix 3, Oswestry Disability Index category descriptors. Available from: Findjewelers.cz  Minimally Clinically Important Difference (MCID) = 12.8%  COGNITION: Overall cognitive status: Within functional limits for tasks assessed     MUSCLE LENGTH: Decreased HS and hip flexor lengths bil POSTURE: tends to stand forward flexed slightly, reports decreased back pain with standing erect  LUMBAR ROM:   AROM eval  Flexion 50  Extension 10  Right lateral flexion   Left lateral flexion   Right rotation   Left rotation    (Blank rows = not tested)  TRUNK STRENGTH:  Decreased activation of transverse abdominus muscles; abdominals 4-/5; decreased activation of lumbar multifidi; trunk extensors 4-/5   LOWER EXTREMITY ROM:   lacks full knee extension bil;  hip flexion 70 degrees, hip extension 5 degrees, unable to achieve seated figure 4 (external  rotation)  LOWER EXTREMITY MMT:  unable to rise from a standard height chair without significant UE assist; unable to SLS without  heavy UE assist  MMT Right eval Left eval  Hip flexion 4- 4-  Hip extension 4- 4-  Hip abduction 3+ 3+  Hip adduction    Hip internal rotation    Hip external rotation    Knee flexion 4- 4-  Knee extension 3+ 3+  Ankle dorsiflexion 4- 4-  Ankle plantarflexion 4- 4-  Ankle inversion    Ankle eversion     (Blank rows = not tested)   FUNCTIONAL TESTS:  5x STS 15.43 sec shortness of breath, low back pain TUG with 4 WW hands assist 24.63 sec back pain < 5 2 MWT 222 feet RPE 5-6/10  O2 98%; strain feeling in back   GAIT: Comments: decreased gait speed, using 4 [employer]  TREATMENT DATE:  [facility] Adult PT Treatment:                                                DATE:   06/30/24:Pt seen for aquatic therapy today.  Treatment took place in water  3.5-4.75 ft in depth at the [facility] pool. Temp of water  was 91.  Pt entered and exited the pool via mechanical lift due to recent fall. -Seated in chair; LE movement in all planes 20x ach Bi -Standing at wall next to chair: 5 heel lifts, 10 marches, 10 side steps, 5 backwards steps: pt holding onto wall -lumbar stretch holding onto 1 rail: 3 breaths 3x; repeat later in session -lateral step holding the wall: 3 steps each direction 3x -holding xtra large noodle 75% depth forward walk 2 lengths back to mechanical chair.  -seated bicycle 45 sec 2x  06/21/24 Pt seen for aquatic therapy today.  Treatment took place in water  3.5-4.75 ft in depth at the [facility] pool. Temp of water  was 91.  Pt entered the pool via stairs step to pattern forward 3 steps then backward x 3 steps with heavy ue support on hand rail and CGA  -seated on bench: LAQ; cycling; marching; hip add/abd; ankle ROM -Standing at half wall: toe raises x 15; heel raises x 10; hip add/abd 2 x 5 reps  - seated rest period -Side stepping R/L x 10 ft ue support barbell along pool side x 6 laps -Ue support wall 3.8 ft: high knee marching; hip extension 2 x 5  reps -forward amb using barbell  4 laps across pool with short rest period on each side Stair negotiation out of pool using step to pattern leading with rle.  VC for forward weigth shift engaging right hip to reduce ue support on hand rails.  Good execution, still requires heavy ue support as submersion is reduced (last 3 steps) and CGA.   Pt requires the buoyancy and hydrostatic pressure of water  for support, and to offload joints by unweighting joint load by at least 50 % in navel deep water  and by at least 75-80% in chest to neck deep water .  Viscosity of the water  is needed for resistance of strengthening. Water  current perturbations provides challenge to standing balance requiring increased core activation.     06/16/24:Pt arrives for aquatic physical therapy. Treatment took place in 3.5-5.5 feet of water . Water  temperature was 91 degrees F. Pt entered the pool via stairs with extra  time, step to pattern and heavy use of rails. Pt requires buoyancy of water  for support and to offload joints with strengthening exercises.  Seated at bench: VC to safely get into seated position. Allowed pt 2 min to relax, focus on breathing naturally/slowly in order to settle into the water  especially at her hips. Once settled: Knee kicks 1 min 2x, ankle pumps Bil 2x20, marching 20x2, hip abd/add 20x2  Standing at half wall (near where she was sitting) heel raises 15x, side steps Bil 15x, step back 15x each leg, mini squats 15x. All holding onto wall for balance.  Holding yellow noodle: walk from half wall to other side of the wall and back, VC for control and to start slow; sit to stand with yellow noodle x6 from bench: rest  Back stretch holding the rails: hold 2 sec 10x. Close SBA to exit pool at stairs, pt did not want the lift chair.    06/02/24:Pt arrives for aquatic physical therapy. Treatment took place in 3.5-5.5 feet of water . Water  temperature was 91 degrees F. Pt entered the pool via stairs with extra  time, step to pattern and heavy use of rails. Pt requires buoyancy of water  for support and to offload joints with strengthening exercises.  Seated at bench: VC to safely get into seated position. Allowed pt 2 min to relax, focus on breathing naturally/slowly in order to settle into the water  especially at her hips. Once settled: Knee kicks 1 min 2x, ankle pumps Bil 2x20, marching 15x,  Standing at half wall (near where she was sitting) heel raises 15x, side steps Bil 15x, step back 15x each leg, mini squats 15x. All holding onto wall for balance.  Holding yellow noodle: walk from half wall to other side of the water  bench: sit to rest about 30 sec, sit to stand  with yellow noodle2x6: rest:walk back towards lift chair and back to the bench. Rest and repeat walk. Back stretch holding the rails: hold 5 sec 4x. Close SBA to exit pool at stairs, pt did not want the lift chair.                                                                                                           PATIENT EDUCATION:  Education details: Educated patient on anatomy and physiology of current symptoms, prognosis, plan of care as well as initial self care strategies to promote recovery Person educated: Patient Education method: Explanation Education comprehension: verbalized understanding  HOME EXERCISE PROGRAM: To be started  ASSESSMENT:  CLINICAL IMPRESSION: pt had recent backwards fall this week. She reports feeling a lot of weakness in her legs that particular day and when she was not holding ontp her walker she just fell backwards. We used an xtra large noodle for water  walking today which helped steady her more. She had trouble advancing her LT leg in the water  today.Stretching her back, she reported, felt very good to her muscles and almost abolished the back pain.    OBJECTIVE IMPAIRMENTS: decreased activity tolerance, decreased balance, decreased mobility, difficulty walking,  decreased ROM, decreased  strength, impaired perceived functional ability, impaired flexibility, and pain.   ACTIVITY LIMITATIONS: carrying, lifting, bending, standing, squatting, sleeping, stairs, transfers, hygiene/grooming, and locomotion level  PARTICIPATION LIMITATIONS: meal prep, cleaning, laundry, driving, shopping, and community activity  PERSONAL FACTORS: Time since onset of injury/illness/exacerbation and 3+ comorbidities: cardiac history, leukemia, lymphedema, HTN are also affecting patient's functional outcome.   REHAB POTENTIAL: Good  CLINICAL DECISION MAKING: Evolving/moderate complexity  EVALUATION COMPLEXITY: Moderate   GOALS: Goals reviewed with patient? Yes  SHORT TERM GOALS: Target date: 06/08/2024    The patient will demonstrate knowledge of basic self care strategies and exercises to promote healing  Baseline: Goal status: INITIAL  2.  The patient will report a 30% improvement in pain levels with functional activities which are currently difficult including night time and in the mornings Baseline:  Goal status: INITIAL  3.  The patient will have improved Timed Up and Go (TUG) time to    20 sec   sec indicating improved gait speed and LE strength  Baseline:  Goal status: INITIAL  4.  The patient will be able to ambulate 350 feet in 3 minutes needed for short community walking, medical appts Baseline:  Goal status: INITIAL  5.  Improved LE strength improved as indicated by 5x STS test to 14 sec with min UE assist Baseline:  Goal status: INITIAL    LONG TERM GOALS: Target date: 07/06/2024    The patient will be independent in a safe self progression of a home exercise program to promote further recovery of function  Baseline:  Goal status: INITIAL  2.  The patient will report a 30% improvement in pain levels with functional activities which are currently difficult including night time and in the mornings Baseline:  Goal status: INITIAL  3.  The patient will have  improved gait stamina and speed needed to ambulate 600 feet in 6 minutes  Baseline:  Goal status: INITIAL  4.  The patient will have improved Timed Up and Go (TUG) time to   17   sec indicating improved gait speed, decreased risk of falls and LE strength  Baseline:  Goal status: INITIAL  5.  Modified Oswestry functional outcome measure score improved to   26  % indicating improved function with ADLS with less pain.   Baseline:  Goal status: INITIAL   PLAN:  PT FREQUENCY: 2x/week  PT DURATION: 8 weeks  PLANNED INTERVENTIONS: 97164- PT Re-evaluation, 97750- Physical Performance Testing, 97110-Therapeutic exercises, 97530- Therapeutic activity, [id number]- Neuromuscular re-education, 97535- Self Care, [id number]- Manual therapy, [phone]- Aquatic Therapy, [phone] (1-2 muscles), 20561 (3+ muscles)- Dry Needling, Patient/Family education, Balance training, Stair training, Taping, Joint mobilization, Cryotherapy, and Moist heat.  PLAN FOR NEXT SESSION: Sees ortho MD Dec 18th. ERO on land Monday, Wed next aquatic if she can make 10:15 time slot.  No ES (pacemaker/defibrillator)  Delon Darner, PTA 06/30/24 12:46 PM   [facility] [address] [place], [place], [address] Phone: [phone]   Fax:  [phone]

## 2024-07-03 ENCOUNTER — Encounter

## 2024-07-03 ENCOUNTER — Encounter: Payer: Self-pay | Admitting: Physical Therapy

## 2024-07-03 ENCOUNTER — Ambulatory Visit: Admitting: Physical Therapy

## 2024-07-03 DIAGNOSIS — R262 Difficulty in walking, not elsewhere classified: Secondary | ICD-10-CM

## 2024-07-03 DIAGNOSIS — M5459 Other low back pain: Secondary | ICD-10-CM | POA: Diagnosis not present

## 2024-07-03 DIAGNOSIS — M6281 Muscle weakness (generalized): Secondary | ICD-10-CM

## 2024-07-03 NOTE — Therapy (Signed)
 OUTPATIENT PHYSICAL THERAPY THORACOLUMBAR TREATMENT/RE-CERTIFICATION AND PROGRESS NOTE  Reporting Period 05/11/24 to 07/03/24   See note below for Objective Data and Assessment of Progress/Goals.    Patient Name: Lisa [name] MRN: [id number] DOB:17-Feb-1941, 83 y.o., female Today's Date: 07/03/2024  END OF SESSION:  PT End of Session - 07/03/24 1534     Visit Number 8    Date for Recertification  09/29/24    Authorization Type Medicare    Progress Note Due on Visit 18    PT Start Time 1534    PT Stop Time 1619    PT Time Calculation (min) 45 min    Activity Tolerance Patient tolerated treatment well;Patient limited by pain    Behavior During Therapy [facility] for tasks assessed/performed                 Past Medical History:  Diagnosis Date   Acute on chronic systolic heart failure (HCC)    Acute renal failure superimposed on stage 3b chronic kidney disease (HCC) 05/22/2022   Bacteremia due to Pseudomonas 06/16/2022   CAD in native artery 2017   stent to LAD   Cardiac arrest (HCC) 09/2015   CHF (congestive heart failure) (HCC)    CML (chronic myelocytic leukemia) (HCC)    Complication of anesthesia    Hx: UTI (urinary tract infection)    Hypertension    Melanoma (HCC)    PONV (postoperative nausea and vomiting)    PVC (premature ventricular contraction)    HISTORY OF   Thyroid  disease    V tach (HCC)    Past Surgical History:  Procedure Laterality Date   ABDOMINAL HYSTERECTOMY     APPENDECTOMY     CARDIAC CATHETERIZATION N/A 10/03/2015   Procedure: Left Heart Cath and Coronary Angiography;  Surgeon: Peter M Jordan, MD;  Location: [facility] INVASIVE CV LAB;  Service: Cardiovascular;  Laterality: N/A;   CARDIAC CATHETERIZATION N/A 10/07/2015   Procedure: Coronary Stent Intervention;  Surgeon: Lonni JONETTA Cash, MD;  Location: [facility] INVASIVE CV LAB;  Service: Cardiovascular;  Laterality: N/A;   CARDIAC CATHETERIZATION  09/22/2019   EP IMPLANTABLE DEVICE N/A  01/20/2016   Procedure: BiV ICD Insertion CRT-D;  Surgeon: Danelle LELON Birmingham, MD;  Location: [facility] INVASIVE CV LAB;  Service: Cardiovascular;  Laterality: N/A;   ICD GENERATOR CHANGEOUT N/A 01/07/2024   Procedure: ICD GENERATOR CHANGEOUT;  Surgeon: Birmingham Danelle LELON, MD;  Location: [facility] INVASIVE CV LAB;  Service: Cardiovascular;  Laterality: N/A;   IR THORACENTESIS ASP PLEURAL SPACE W/IMG GUIDE  07/20/2023   IR THORACENTESIS ASP PLEURAL SPACE W/IMG GUIDE  12/02/2023   LEFT HEART CATH AND CORONARY ANGIOGRAPHY N/A 09/22/2019   Procedure: LEFT HEART CATH AND CORONARY ANGIOGRAPHY;  Surgeon: Jordan, Peter M, MD;  Location: [facility] INVASIVE CV LAB;  Service: Cardiovascular;  Laterality: N/A;   TONSILLECTOMY     TONSILLECTOMY     TUBAL LIGATION     Patient Active Problem List   Diagnosis Date Noted   ICD (implantable cardioverter-defibrillator) discharge 12/29/2023   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   Need for management of chest tube 12/06/2023   Hydropneumothorax 12/04/2023   Obesity, class 1 08/20/2023   Fall at home, initial encounter 08/09/2023   Generalized weakness 08/09/2023   Elevated troponin 08/09/2023   Anemia of chronic disease 08/09/2023   Acute on chronic diastolic heart failure (HCC) 08/08/2023   Cellulitis, leg 07/17/2023   Open wound of right lower extremity 07/17/2023   Respiratory distress 07/17/2023   Recurrent left pleural effusion  07/17/2023   Heart failure with reduced ejection fraction (HCC) 07/17/2023   Chronic kidney disease, stage III (moderate) (HCC) 07/17/2023   Obesity (BMI 30-39.9) 07/17/2023   Urine culture positive 06/19/2022   Transaminitis 06/17/2022   Viral gastroenteritis 06/16/2022   Venous stasis dermatitis of both lower extremities 05/26/2022   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 05/22/2022   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/21/2022   Paroxysmal ventricular tachycardia (HCC) 09/21/2019   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   Unstable  angina (HCC) 10/07/2015   Chronic systolic heart failure (HCC)    PVC's (premature ventricular contractions)    Ventricular fibrillation (HCC) 10/03/2015   History of cardiac arrest 10/03/2015   Essential hypertension 09/09/2015   Lymphedema 02/21/2015   Acquired hypothyroidism 02/21/2015    PCP: Claudene Pellet MD  REFERRING PROVIDER: Claudene Pellet MD  REFERRING DIAG: M54.50 LBP  Rationale for Evaluation and Treatment: Rehabilitation  THERAPY DIAG:  Other low back pain  Difficulty in walking, not elsewhere classified  Muscle weakness (generalized)  ONSET DATE: approx 1 year  SUBJECTIVE:                                                                                                                                                                                           SUBJECTIVE STATEMENT: Everything hurts in her left hip. Seeing Dr. Vernetta on 07/26/24. The pool is the best thing. I go hobbling in, but come out feeling better.    PERTINENT HISTORY:  Leukemia oral chemo Pacemaker/debrillator CHF HTN Hospitalized in May for 1 week, fluid  2 falls in July: turning off light and tipped over on side;  the other fall was after got out of shower and turned and sat straight down (had to get son and caregiver helped get off the floor) caregiver 2x/week, alone 4 days/week HHPT 4-5 weeks walking with 4 WW, bands (red) for legs and arms  Blood clot in leg Lymphedema   PAIN:   07/03/2024  Are you having pain? Yes NPRS scale: 4-5/10 with sitting; night time really bad  Pain location: bil lumbar and left hip Pain orientation: Bilateral  PAIN TYPE: aching Pain description: intermittent  Aggravating factors: night, early AM, lying down  Relieving factors: sleep with heating pad; Tylenol , Blue Emu, Tiger Balm  PRECAUTIONS: ICD/Pacemaker    WEIGHT BEARING RESTRICTIONS: fall  FALLS:  Has patient fallen in last 6 months? Yes. Number of falls 2  LIVING  ENVIRONMENT: Lives with: lives alone Lives in: House/apartment Stairs: No, 3 steps to get in the door (getting better) Has following equipment at home: Vannie - 4 wheeled, shower chair  OCCUPATION: psychologist, counselling, real estate, currently volunteer for [employer]  PLOF: Independent with basic ADLs  PATIENT GOALS: want to have hydrotherapy; go as far as I can in mobility; walk without help but I'm too off balance; be more flexible; get in car easier without hands assisting legs; energy to walk around grocery store  NEXT MD VISIT: as needed  OBJECTIVE:  Note: Objective measures were completed at Evaluation unless otherwise noted.  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  2. Personal care (washing, dressing, etc.) 0 =  I can take care of myself normally without causing increased pain.  3. Lifting 3 = Pain prevents me from lifting heavy weights, but I can manage (5) I have hardly any social life because of my pain. light to medium weights if they are conveniently positioned  4. Walking 3 =  Pain prevents me from walking more than  mile.  5. Sitting 1 =  I can only sit in my favorite chair as long as I like.  6. Standing 3 =  Pain prevents me from standing more than 1/2 hour.  7. Sleeping 1 = I can sleep well only by using pain medication.  8. Social Life 1 =  My social life is normal, but it increases my level of pain.  9. Traveling 1 =  I can travel anywhere, but it increases my pain.  10. Employment/ Homemaking 2 = I can perform most of my homemaking/job duties, but pain prevents me from performing more physically stressful activities (eg, lifting, vacuuming).  Total 34%   Interpretation of scores: Score Category Description  0-20%  Minimal Disability The patient can cope with most living activities. Usually no treatment is indicated apart from advice on lifting, sitting and exercise  21-40% Moderate Disability The patient experiences more pain and difficulty with sitting, lifting and standing. Travel and social life are more difficult and they may be disabled from work. Personal care, sexual activity and sleeping are not grossly affected, and the patient can usually be managed by conservative means  41-60% Severe Disability Pain remains the main problem in this group, but activities of daily living are affected. These patients require a detailed investigation  61-80% Crippled Back pain impinges on all aspects of the patient's life. Positive intervention is required  81-100% Bed-bound  These patients are either bed-bound or exaggerating their symptoms  Bluford FORBES Zoe DELENA Karon DELENA, et al. Surgery versus conservative management of stable thoracolumbar fracture: the PRESTO feasibility RCT. [place] ([place]): [employer]; 2021 Nov. [facility] Technology Assessment, No. 25.62.) Appendix 3, Oswestry Disability Index category descriptors. Available from: Findjewelers.cz  Minimally Clinically Important Difference (MCID) = 12.8%  Modified Oswestry: 07/03/24  22 / 50 = 44.0 %  COGNITION: Overall cognitive status: Within functional limits for tasks assessed     MUSCLE LENGTH: Decreased HS and hip flexor lengths bil POSTURE: tends to stand forward flexed slightly, reports decreased back pain with standing erect  LUMBAR ROM:   AROM eval  Flexion 50  Extension 10  Right lateral flexion   Left lateral flexion   Right rotation   Left rotation    (Blank rows = not tested)  TRUNK STRENGTH:  Decreased activation of transverse abdominus muscles; abdominals 4-/5; decreased activation of  lumbar multifidi; trunk extensors 4-/5   LOWER EXTREMITY ROM:   lacks full knee extension bil;  hip flexion 70  degrees, hip extension 5 degrees, unable to achieve seated figure 4 (external rotation)  LOWER EXTREMITY MMT:  unable to rise from a standard height chair without significant UE assist; unable to SLS without heavy UE assist  MMT Right eval Left eval  Hip flexion 4- 4-  Hip extension 4- 4-  Hip abduction 3+ 3+  Hip adduction    Hip internal rotation    Hip external rotation    Knee flexion 4- 4-  Knee extension 3+ 3+  Ankle dorsiflexion 4- 4-  Ankle plantarflexion 4- 4-  Ankle inversion    Ankle eversion     (Blank rows = not tested)   FUNCTIONAL TESTS:  5x STS 15.43 sec shortness of breath, low back pain TUG with 4 WW hands assist 24.63 sec back pain < 5 2 MWT 222 feet RPE 5-6/10  O2 98%; strain feeling in back   07/03/24 TUG with 4WW hands assist  32.21 sec 5xSTS 19.17 sec no SOB reported, low back pain   GAIT: Comments: decreased gait speed, using 4 [employer]  TREATMENT DATE:  [facility] Adult PT Treatment:                                                DATE:   07/03/24 Discussion of current status, POC, recent fall, hip pain 5xsts TUG Mod oswestry Heel slides x 5 B Supine clam x 10 painful Hooklying hip ADD squeeze into ball x 10 some pain Supine piriformis stretch which foot over leg (top knee bent) x 30 sec painful Sit <>Supine transfers min A x 1     06/30/24:Pt seen for aquatic therapy today.  Treatment took place in water  3.5-4.75 ft in depth at the [facility] pool. Temp of water  was 91.  Pt entered and exited the pool via mechanical lift due to recent fall. -Seated in chair; LE movement in all planes 20x ach Bi -Standing at wall next to chair: 5 heel lifts, 10 marches, 10 side steps, 5 backwards steps: pt holding onto wall -lumbar stretch holding onto 1 rail: 3 breaths 3x; repeat later in session -lateral step holding the wall: 3 steps each direction 3x -holding xtra large noodle 75% depth forward walk 2 lengths back to mechanical chair.  -seated  bicycle 45 sec 2x  06/21/24 Pt seen for aquatic therapy today.  Treatment took place in water  3.5-4.75 ft in depth at the [facility] pool. Temp of water  was 91.  Pt entered the pool via stairs step to pattern forward 3 steps then backward x 3 steps with heavy ue support on hand rail and CGA  -seated on bench: LAQ; cycling; marching; hip add/abd; ankle ROM -Standing at half wall: toe raises x 15; heel raises x 10; hip add/abd 2 x 5 reps  - seated rest period -Side stepping R/L x 10 ft ue support barbell along pool side x 6 laps -Ue support wall 3.8 ft: high knee marching; hip extension 2 x 5 reps -forward amb using barbell  4 laps across pool with short rest period on each side Stair negotiation out of pool using step to pattern leading with rle.  VC for forward weigth shift engaging right hip to reduce ue support on hand rails.  Good execution, still requires heavy ue support as submersion is reduced (last 3 steps) and CGA.   Pt requires the buoyancy and hydrostatic pressure of water  for support, and to offload joints by unweighting joint load by at least 50 % in navel deep water  and by at least 75-80% in chest to neck deep water .  Viscosity of the water  is needed for resistance of strengthening. Water  current perturbations provides challenge to standing balance requiring increased core activation.     06/16/24:Pt arrives for aquatic physical therapy. Treatment took place in 3.5-5.5 feet of water . Water  temperature was 91 degrees F. Pt entered the pool via stairs with extra time, step to pattern and heavy use of rails. Pt requires buoyancy of water  for support and to offload joints with strengthening exercises.  Seated at bench: VC to safely get into seated position. Allowed pt 2 min to relax, focus on breathing naturally/slowly in order to settle into the water  especially at her hips. Once settled: Knee kicks 1 min 2x, ankle pumps Bil 2x20, marching 20x2, hip abd/add 20x2  Standing at  half wall (near where she was sitting) heel raises 15x, side steps Bil 15x, step back 15x each leg, mini squats 15x. All holding onto wall for balance.  Holding yellow noodle: walk from half wall to other side of the wall and back, VC for control and to start slow; sit to stand with yellow noodle x6 from bench: rest  Back stretch holding the rails: hold 2 sec 10x. Close SBA to exit pool at stairs, pt did not want the lift chair.    06/02/24:Pt arrives for aquatic physical therapy. Treatment took place in 3.5-5.5 feet of water . Water  temperature was 91 degrees F. Pt entered the pool via stairs with extra time, step to pattern and heavy use of rails. Pt requires buoyancy of water  for support and to offload joints with strengthening exercises.  Seated at bench: VC to safely get into seated position. Allowed pt 2 min to relax, focus on breathing naturally/slowly in order to settle into the water  especially at her hips. Once settled: Knee kicks 1 min 2x, ankle pumps Bil 2x20, marching 15x,  Standing at half wall (near where she was sitting) heel raises 15x, side steps Bil 15x, step back 15x each leg, mini squats 15x. All holding onto wall for balance.  Holding yellow noodle: walk from half wall to other side of the water  bench: sit to rest about 30 sec, sit to stand  with yellow noodle2x6: rest:walk back towards lift chair and back to the bench. Rest and repeat walk. Back stretch holding the rails: hold 5 sec 4x. Close SBA to exit pool at stairs, pt did not want the lift chair.                                                                                                           PATIENT EDUCATION:  Education details: Educated patient on anatomy and physiology of current symptoms, prognosis, plan of care as well as initial self care strategies to promote  recovery Person educated: Patient Education method: Explanation Education comprehension: verbalized understanding  HOME EXERCISE PROGRAM: Access Code:  [email or web address] URL: [email or web address] Date: 07/03/2024 Prepared by: Mliss  Exercises - Supine Heel Slide  - 1 x daily - 7 x weekly - 2 sets - 10 reps - Beginner Bridge  - 1 x daily - 7 x weekly - 2 sets - 10 reps - Seated Hip Abduction  - 1 x daily - 7 x weekly - 2 sets - 10 reps - Sit to Stand with Counter Support  - 2 x daily - 7 x weekly - 2 sets - 5 repsTo be started  ASSESSMENT:  CLINICAL IMPRESSION: AVYANNA SPADA presents today with reports of increased left hip pain in addition to low back pain. She had a recent backwards fall after standing from a chair. She is seeing Dr. Vernetta 07/26/24. She states she is moving slower than at her evaluation because of the hip. This is supported by her decrease in distance on the 3 MWT. She was only able to walk 183 ft and then had to rest due to pain and weakness. She also reports she feels unstable walking up/down her steps at home. She has 3 steps to leave her house. Her 5xSTS and TUG scores also were slower indicating she is at risk for falls. She cannot flex her hips from a seated position affecting her ability to perform sit <>supine transfers and climb stairs. HEP issued for strengthening. She continues to demonstrate potential for improvement and would benefit from continued skilled therapy to address remaining impairments.  I recommend 2x/wk in the pool as she does not tolerate land therapy at this time.    OBJECTIVE IMPAIRMENTS: decreased activity tolerance, decreased balance, decreased mobility, difficulty walking, decreased ROM, decreased strength, impaired perceived functional ability, impaired flexibility, and pain.   ACTIVITY LIMITATIONS: carrying, lifting, bending, standing, squatting, sleeping, stairs, transfers, hygiene/grooming, and locomotion level  PARTICIPATION LIMITATIONS: meal prep, cleaning, laundry, driving, shopping, and community activity  PERSONAL FACTORS: Time since onset of injury/illness/exacerbation  and 3+ comorbidities: cardiac history, leukemia, lymphedema, HTN are also affecting patient's functional outcome.   REHAB POTENTIAL: Good  CLINICAL DECISION MAKING: Evolving/moderate complexity  EVALUATION COMPLEXITY: Moderate   GOALS: Goals reviewed with patient? Yes  SHORT TERM GOALS: Target date: 06/08/2024    The patient will demonstrate knowledge of basic self care strategies and exercises to promote healing  Baseline: Goal status: MET  2.  The patient will report a 30% improvement in pain levels with functional activities which are currently difficult including night time and in the mornings Baseline:  Goal status: IN PROGRESS 11/24  3.  The patient will have improved Timed Up and Go (TUG) time to    20 sec   sec indicating improved gait speed and LE strength  Baseline:  Goal status: IN PROGRESS 11/24  4.  The patient will be able to ambulate 350 feet in 3 minutes needed for short community walking, medical appts Baseline:  Goal status: IN PROGRESS 11/24 183 FT in 3 min  5.  Improved LE strength improved as indicated by 5x STS test to 14 sec with min UE assist Baseline:  Goal status: IN PROGRESS 11/24    LONG TERM GOALS: Target date: 09/29/24    The patient will be independent in a safe self progression of a home exercise program to promote further recovery of function  Baseline:  Goal status: INITIAL  2.  The patient will report a 30% improvement in pain  levels with functional activities which are currently difficult including night time and in the mornings Baseline:  Goal status: INITIAL  3.  The patient will have improved gait stamina and speed needed to ambulate 600 feet in 6 minutes  Baseline:  Goal status: INITIAL  4.  The patient will have improved Timed Up and Go (TUG) time to   17   sec indicating improved gait speed, decreased risk of falls and LE strength  Baseline:  Goal status: INITIAL  5.  Modified Oswestry functional outcome measure score  improved to   26  % indicating improved function with ADLS with less pain.   Baseline:  Goal status: INITIAL   PLAN:  PT FREQUENCY: 2x/week  PT DURATION: 12 weeks  PLANNED INTERVENTIONS: 97164- PT Re-evaluation, 97750- Physical Performance Testing, 97110-Therapeutic exercises, 97530- Therapeutic activity, 97112- Neuromuscular re-education, 97535- Self Care, [id number]- Manual therapy, [phone]- Aquatic Therapy, [phone] (1-2 muscles), 20561 (3+ muscles)- Dry Needling, Patient/Family education, Balance training, Stair training, Taping, Joint mobilization, Cryotherapy, and Moist heat.  PLAN FOR NEXT SESSION: Sees ortho MD Dec 17th, continue strengthening hip flexors especially for step ups, and general hip strengthening No ES (pacemaker/defibrillator)  Mliss Cummins, PT 07/03/24 4:40 PM

## 2024-07-05 ENCOUNTER — Ambulatory Visit: Admitting: Physical Therapy

## 2024-07-10 ENCOUNTER — Encounter

## 2024-07-20 ENCOUNTER — Encounter: Payer: Self-pay | Admitting: Physical Therapy

## 2024-07-21 ENCOUNTER — Ambulatory Visit: Attending: Family Medicine | Admitting: Physical Therapy

## 2024-07-24 ENCOUNTER — Ambulatory Visit: Attending: Internal Medicine

## 2024-07-24 DIAGNOSIS — I5022 Chronic systolic (congestive) heart failure: Secondary | ICD-10-CM | POA: Diagnosis not present

## 2024-07-24 DIAGNOSIS — Z95810 Presence of automatic (implantable) cardiac defibrillator: Secondary | ICD-10-CM

## 2024-07-24 NOTE — Progress Notes (Signed)
 EPIC Encounter for ICM Monitoring  Patient Name: Lisa [name] is a 83 y.o. female Date: 07/24/2024 Primary Care Physican: Claudene Pellet, MD Primary Cardiologist: Okey Electrophysiologist: Waddell Pore Pacing: 98.1%         11/12/2021 Weight: 248 lbs 01/21/2022 Weight: 246 lbs 09/01/2022 Office Weight: 205 lbs 04/23/2023 Office Weight: 206 lbs 09/02/2023 Weight: 170 lbs 09/14/2023 Weight: 172 lbs 03/02/2024 Weight: 141.8 lbs  04/06/2024 Weight: 131.4 lbs       06/09/2024 Office Weight: 119 lbs      07/24/2024 Weight: 119-120 lbs                                       Spoke with patient and heart failure questions reviewed.  Transmission results reviewed.  Pt reports swelling of her feet.  Reports feeling well at this time and voices no complaints.     Diet:  Reviews labels for salt contents and buys low salt foods.     Since 06/19/2024 ICM Remote Transmission: [employer] thoracic impedance suggesting possible fluid accumulation staring 07/03/2024.    Prescribed:  Torsemide  20 mg take 1 tablet(s) (20 mg total) by mouth daily.   11/16/2023 She reports Dr Waddell advised, at 11/01/2023 OV, she can take an extra Torsemide  about 1-2 times a week if needed.     Labs: 06/15/2024 Creatinine 1.57, BUN 36, Potassium 4.3, Sodium 134, GFR 33 06/05/2024 Creatinine 1.45, BUN 33, Potassium 4.5, Sodium 135, GFR 36 03/02/2024 Creatinine 1.84, BUN 26, Potassium 4.2, Sodium 133, GFR 27  01/08/2024 Creatinine 1.47, BUN 18, Potassium 4.4, Sodium 134, GFR 36  01/07/2024 Creatinine 1.43, BUN 19, Potassium 4.4, Sodium 134, GFR 37  01/06/2024 Creatinine 1.54, BUN 21, Potassium 3.3, Sodium 137, GFR 34 01/05/2024 Creatinine 1.54, BUN 23, Potassium 3.8, Sodium 134, GFR 34  A complete set of results can be found in Results Review.   Recommendations:  Advised to take Lasix  20 mg twice a day x 1 day and if swelling does not resolve may take a 2nd day of extra lasix  then resume to 20 mg daily.     Follow-up plan: ICM  clinic phone appointment on 07/31/2024 to recheck fluid levels.   91 day device clinic remote transmission 08/29/2024.     EP/Cardiology Office Visits:  Recall 09/19/2024 with Dr Almetta.     Copy of ICM check sent to Dr. Waddell.    Remote monitoring is medically necessary for Heart Failure Management.    Daily Thoracic Impedance ICM trend: 04/24/2024 through 07/24/2024.    12-14 Month Thoracic Impedance ICM trend:     Mitzie GORMAN Garner, RN 07/24/2024 3:21 PM

## 2024-07-26 ENCOUNTER — Other Ambulatory Visit (INDEPENDENT_AMBULATORY_CARE_PROVIDER_SITE_OTHER)

## 2024-07-26 ENCOUNTER — Ambulatory Visit: Admitting: Orthopaedic Surgery

## 2024-07-26 ENCOUNTER — Other Ambulatory Visit: Payer: Self-pay

## 2024-07-26 DIAGNOSIS — M1712 Unilateral primary osteoarthritis, left knee: Secondary | ICD-10-CM

## 2024-07-26 DIAGNOSIS — M1711 Unilateral primary osteoarthritis, right knee: Secondary | ICD-10-CM

## 2024-07-26 DIAGNOSIS — R29898 Other symptoms and signs involving the musculoskeletal system: Secondary | ICD-10-CM

## 2024-07-26 DIAGNOSIS — M25562 Pain in left knee: Secondary | ICD-10-CM | POA: Diagnosis not present

## 2024-07-26 DIAGNOSIS — M25552 Pain in left hip: Secondary | ICD-10-CM

## 2024-07-26 DIAGNOSIS — G8929 Other chronic pain: Secondary | ICD-10-CM | POA: Diagnosis not present

## 2024-07-26 MED ORDER — METHYLPREDNISOLONE ACETATE 40 MG/ML IJ SUSP
40.0000 mg | INTRAMUSCULAR | Status: AC | PRN
  Administered 2024-07-26: 14:00:00 40 mg via INTRA_ARTICULAR

## 2024-07-26 MED ORDER — LIDOCAINE HCL 1 % IJ SOLN
3.0000 mL | INTRAMUSCULAR | Status: AC | PRN
  Administered 2024-07-26: 14:00:00 3 mL

## 2024-07-26 NOTE — Progress Notes (Signed)
 The patient is a pleasant 83 year old female who comes in today with bilateral lower extremity weakness with some numbness.  She does report some left-sided low back pain and knee pain.  She does ambulate slowly with a rolling walker.  She does participate in aquatic therapy and cannot participate in a lot more therapy due to the weakness in her legs.  She does have a defibrillator but she says she thinks it may be MRI compatible.  She ambulates very slowly with a rolling walker.  She is not a diabetic.  I was able to review her past medical history and medications within epic and she reports that she is on a blood thinner.  On exam both knees have varus malalignment with patellofemoral crepitation.  She does have lymphedema in her legs.  She has weakness throughout her bilateral lower extremities in terms of quad weakness and overall weakness and deconditioning.  There is a component of low back pain the left side.  Both her hips move smoothly and fluidly.  An AP pelvis shows normal-appearing hips bilaterally which is minimal arthritic changes.  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  I did recommend a steroid injection in her left knee today which she agreed to and tolerated well.  If her defibrillator is MRI compatible, it would be worth obtaining an MRI of her lumbar spine to assess the degree of stenosis that she may have.  If that is not possible then a CT myelogram would be warranted.    Procedure Note  Patient: Lisa [name]             Date of Birth: 12/03/1940           MRN: [id number]             Visit Date: 07/26/2024  Procedures: Visit Diagnoses:  1. Chronic pain of left knee   2. Pain in left hip     Large Joint Inj: L knee on 07/26/2024 2:07 PM Indications: diagnostic evaluation and pain Details: 22 G 1.5 in needle, superolateral approach  Arthrogram:  No  Medications: 3 mL lidocaine  1 %; 40 mg methylPREDNISolone  acetate 40 MG/ML Outcome: tolerated well, no immediate complications Procedure, treatment alternatives, risks and benefits explained, specific risks discussed. Consent was given by the patient. Immediately prior to procedure a time out was called to verify the correct patient, procedure, equipment, support staff and site/side marked as required. Patient was prepped and draped in the usual sterile fashion.

## 2024-07-28 ENCOUNTER — Ambulatory Visit: Attending: Family Medicine | Admitting: Physical Therapy

## 2024-07-28 ENCOUNTER — Encounter: Payer: Self-pay | Admitting: Physical Therapy

## 2024-07-28 DIAGNOSIS — M5459 Other low back pain: Secondary | ICD-10-CM | POA: Insufficient documentation

## 2024-07-28 DIAGNOSIS — M6281 Muscle weakness (generalized): Secondary | ICD-10-CM | POA: Diagnosis present

## 2024-07-28 DIAGNOSIS — R262 Difficulty in walking, not elsewhere classified: Secondary | ICD-10-CM | POA: Diagnosis present

## 2024-07-28 NOTE — Therapy (Signed)
 " OUTPATIENT PHYSICAL THERAPY THORACOLUMBAR TREATMENT NOTE     Patient Name: Lisa [name] MRN: [id number] DOB:November 01, 1940, 83 y.o., female Today's Date: 07/28/2024  END OF SESSION:  PT End of Session - 07/28/24 1431     Visit Number 9    Date for Recertification  09/29/24    Authorization Type Medicare    Progress Note Due on Visit 18    PT Start Time 1430    PT Stop Time 1508    PT Time Calculation (min) 38 min    Activity Tolerance Patient tolerated treatment well    Behavior During Therapy [facility] for tasks assessed/performed                  Past Medical History:  Diagnosis Date   Acute on chronic systolic heart failure (HCC)    Acute renal failure superimposed on stage 3b chronic kidney disease (HCC) 05/22/2022   Bacteremia due to Pseudomonas 06/16/2022   CAD in native artery 2017   stent to LAD   Cardiac arrest (HCC) 09/2015   CHF (congestive heart failure) (HCC)    CML (chronic myelocytic leukemia) (HCC)    Complication of anesthesia    Hx: UTI (urinary tract infection)    Hypertension    Melanoma (HCC)    PONV (postoperative nausea and vomiting)    PVC (premature ventricular contraction)    HISTORY OF   Thyroid  disease    V tach (HCC)    Past Surgical History:  Procedure Laterality Date   ABDOMINAL HYSTERECTOMY     APPENDECTOMY     CARDIAC CATHETERIZATION N/A 10/03/2015   Procedure: Left Heart Cath and Coronary Angiography;  Surgeon: Peter M Jordan, MD;  Location: [facility] INVASIVE CV LAB;  Service: Cardiovascular;  Laterality: N/A;   CARDIAC CATHETERIZATION N/A 10/07/2015   Procedure: Coronary Stent Intervention;  Surgeon: Lonni JONETTA Cash, MD;  Location: [facility] INVASIVE CV LAB;  Service: Cardiovascular;  Laterality: N/A;   CARDIAC CATHETERIZATION  09/22/2019   EP IMPLANTABLE DEVICE N/A 01/20/2016   Procedure: BiV ICD Insertion CRT-D;  Surgeon: Danelle LELON Birmingham, MD;  Location: [facility] INVASIVE CV LAB;  Service: Cardiovascular;  Laterality: N/A;   ICD  GENERATOR CHANGEOUT N/A 01/07/2024   Procedure: ICD GENERATOR CHANGEOUT;  Surgeon: Birmingham Danelle LELON, MD;  Location: [facility] INVASIVE CV LAB;  Service: Cardiovascular;  Laterality: N/A;   IR THORACENTESIS ASP PLEURAL SPACE W/IMG GUIDE  07/20/2023   IR THORACENTESIS ASP PLEURAL SPACE W/IMG GUIDE  12/02/2023   LEFT HEART CATH AND CORONARY ANGIOGRAPHY N/A 09/22/2019   Procedure: LEFT HEART CATH AND CORONARY ANGIOGRAPHY;  Surgeon: Jordan, Peter M, MD;  Location: [facility] INVASIVE CV LAB;  Service: Cardiovascular;  Laterality: N/A;   TONSILLECTOMY     TONSILLECTOMY     TUBAL LIGATION     Patient Active Problem List   Diagnosis Date Noted   ICD (implantable cardioverter-defibrillator) discharge 12/29/2023   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   Need for management of chest tube 12/06/2023   Hydropneumothorax 12/04/2023   Obesity, class 1 08/20/2023   Fall at home, initial encounter 08/09/2023   Generalized weakness 08/09/2023   Elevated troponin 08/09/2023   Anemia of chronic disease 08/09/2023   Acute on chronic diastolic heart failure (HCC) 08/08/2023   Cellulitis, leg 07/17/2023   Open wound of right lower extremity 07/17/2023   Respiratory distress 07/17/2023   Recurrent left pleural effusion 07/17/2023   Heart failure with reduced ejection fraction (HCC) 07/17/2023   Chronic kidney disease, stage III (moderate) (HCC)  07/17/2023   Obesity (BMI 30-39.9) 07/17/2023   Urine culture positive 06/19/2022   Transaminitis 06/17/2022   Viral gastroenteritis 06/16/2022   Venous stasis dermatitis of both lower extremities 05/26/2022   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 05/22/2022   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/21/2022   Paroxysmal ventricular tachycardia (HCC) 09/21/2019   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   Unstable angina (HCC) 10/07/2015   Chronic systolic heart failure (HCC)    PVC's (premature ventricular contractions)    Ventricular fibrillation (HCC) 10/03/2015    History of cardiac arrest 10/03/2015   Essential hypertension 09/09/2015   Lymphedema 02/21/2015   Acquired hypothyroidism 02/21/2015    PCP: Claudene Pellet MD  REFERRING PROVIDER: Claudene Pellet MD  REFERRING DIAG: M54.50 LBP  Rationale for Evaluation and Treatment: Rehabilitation  THERAPY DIAG:  Other low back pain  Difficulty in walking, not elsewhere classified  Muscle weakness (generalized)  ONSET DATE: approx 1 year  SUBJECTIVE:                                                                                                                                                                                           SUBJECTIVE STATEMENT: Moving slow. I got an injection in my knee the other day.  PERTINENT HISTORY:  Leukemia oral chemo Pacemaker/debrillator CHF HTN Hospitalized in May for 1 week, fluid  2 falls in July: turning off light and tipped over on side;  the other fall was after got out of shower and turned and sat straight down (had to get son and caregiver helped get off the floor) caregiver 2x/week, alone 4 days/week HHPT 4-5 weeks walking with 4 WW, bands (red) for legs and arms  Blood clot in leg Lymphedema   PAIN:   07/28/2024  Are you having pain? Yes NPRS scale: 4-5/10 with sitting; night time really bad  Pain location: bil lumbar and left hip Pain orientation: Bilateral  PAIN TYPE: aching Pain description: intermittent  Aggravating factors: night, early AM, lying down  Relieving factors: sleep with heating pad; Tylenol , Blue Emu, Tiger Balm  PRECAUTIONS: ICD/Pacemaker    WEIGHT BEARING RESTRICTIONS: fall  FALLS:  Has patient fallen in last 6 months? Yes. Number of falls 2  LIVING ENVIRONMENT: Lives with: lives alone Lives in: House/apartment Stairs: No, 3 steps to get in the door (getting better) Has following equipment at home: Vannie - 4 wheeled, shower chair  OCCUPATION: psychologist, counselling, real estate, currently volunteer for [employer]  PLOF: Independent with basic ADLs  PATIENT GOALS: want to have hydrotherapy; go as far as I can in mobility; walk without help but I'm too  off balance; be more flexible; get in car easier without hands assisting legs; energy to walk around grocery store  NEXT MD VISIT: as needed  OBJECTIVE:  Note: Objective measures were completed at Evaluation unless otherwise noted.  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  2. Personal care (washing, dressing, etc.) 0 =  I can take care of myself normally without causing increased pain.  3. Lifting 3 = Pain prevents me from lifting heavy weights, but I can manage (5) I have hardly any social life because of my pain. light to medium weights if they are conveniently positioned  4. Walking 3 =  Pain prevents me from walking more than  mile.  5. Sitting 1 =  I can only sit in my favorite chair as long as I like.  6. Standing 3 =  Pain prevents me from standing more than 1/2 hour.  7. Sleeping 1 = I can sleep well only by using pain medication.  8. Social Life 1 =  My social life is normal, but it increases my level of pain.  9. Traveling 1 =  I can travel anywhere, but it increases my pain.  10. Employment/ Homemaking 2 = I can perform most of my homemaking/job duties, but pain prevents me from performing more physically stressful activities (eg, lifting, vacuuming).  Total 34%   Interpretation of scores: Score Category Description  0-20% Minimal Disability The patient can cope with most living activities. Usually no treatment is indicated apart from advice on lifting, sitting and exercise  21-40% Moderate Disability The patient experiences more pain and difficulty with sitting, lifting and standing.  Travel and social life are more difficult and they may be disabled from work. Personal care, sexual activity and sleeping are not grossly affected, and the patient can usually be managed by conservative means  41-60% Severe Disability Pain remains the main problem in this group, but activities of daily living are affected. These patients require a detailed investigation  61-80% Crippled Back pain impinges on all aspects of the patients life. Positive intervention is required  81-100% Bed-bound  These patients are either bed-bound or exaggerating their symptoms  Bluford FORBES Zoe DELENA Karon DELENA, et al. Surgery versus conservative management of stable thoracolumbar fracture: the PRESTO feasibility RCT. [place] ([place]): [employer]; 2021 Nov. [facility] Technology Assessment, No. 25.62.) Appendix 3, Oswestry Disability Index category descriptors. Available from: Findjewelers.cz  Minimally Clinically Important Difference (MCID) = 12.8%  Modified Oswestry: 07/03/24  22 / 50 = 44.0 %  COGNITION: Overall cognitive status: Within functional limits for tasks assessed     MUSCLE LENGTH: Decreased HS and hip flexor lengths bil POSTURE: tends to stand forward flexed slightly, reports decreased back pain with standing erect  LUMBAR ROM:   AROM eval  Flexion 50  Extension 10  Right lateral flexion   Left lateral flexion   Right rotation   Left rotation    (Blank rows = not tested)  TRUNK STRENGTH:  Decreased activation of transverse abdominus muscles; abdominals 4-/5; decreased activation of lumbar multifidi; trunk extensors 4-/5   LOWER EXTREMITY ROM:   lacks full knee extension bil;  hip flexion 70 degrees, hip extension 5 degrees, unable to achieve seated figure 4 (external rotation)  LOWER  EXTREMITY MMT:  unable to rise from a standard height chair without significant UE assist; unable to SLS without heavy UE assist  MMT Right eval Left eval  Hip  flexion 4- 4-  Hip extension 4- 4-  Hip abduction 3+ 3+  Hip adduction    Hip internal rotation    Hip external rotation    Knee flexion 4- 4-  Knee extension 3+ 3+  Ankle dorsiflexion 4- 4-  Ankle plantarflexion 4- 4-  Ankle inversion    Ankle eversion     (Blank rows = not tested)   FUNCTIONAL TESTS:  5x STS 15.43 sec shortness of breath, low back pain TUG with 4 WW hands assist 24.63 sec back pain < 5 2 MWT 222 feet RPE 5-6/10  O2 98%; strain feeling in back   07/03/24 TUG with 4WW hands assist  32.21 sec 5xSTS 19.17 sec no SOB reported, low back pain   GAIT: Comments: decreased gait speed, using 4 [employer]  TREATMENT DATE:  [facility] Adult PT Treatment:                                                DATE:   07/28/24:t seen for aquatic therapy today.  Treatment took place in water  3.5-4.75 ft in depth at the [facility] pool. Temp of water  was 91.  Pt entered and exited the pool via mechanical lift due to recent fall. -Seated in chair; LE movement in all planes 20x2 each Bi -Standing at wall next to chair: 2x10 heel lifts, 10x2 marches, 10 side steps, 10 backwards steps: pt holding onto wall -lumbar stretch holding onto 1 rail: 3 breaths 3x; repeat later in session -lateral step holding the wall: 3 steps each direction 3x.  -seated bicycle 1 min 3x   07/03/24 Discussion of current status, POC, recent fall, hip pain 5xsts TUG Mod oswestry Heel slides x 5 B Supine clam x 10 painful Hooklying hip ADD squeeze into ball x 10 some pain Supine piriformis stretch which foot over leg (top knee bent) x 30 sec painful Sit <>Supine transfers min A x 1     06/30/24:Pt seen for aquatic therapy today.  Treatment took place in water  3.5-4.75 ft in depth at the [facility] pool. Temp of water  was 91.  Pt entered and exited the pool via mechanical lift due to recent fall. -Seated in chair; LE movement in all planes 20x ach Bi -Standing at wall next to chair: 5  heel lifts, 10 marches, 10 side steps, 5 backwards steps: pt holding onto wall -lumbar stretch holding onto 1 rail: 3 breaths 3x; repeat later in session -lateral step holding the wall: 3 steps each direction 3x -holding xtra large noodle 75% depth forward walk 2 lengths back to mechanical chair.  -seated bicycle 45 sec 2x  PATIENT EDUCATION:  Education details: Educated patient on anatomy and physiology of current symptoms, prognosis, plan of care as well as initial self care strategies to promote recovery Person educated: Patient Education method: Explanation Education comprehension: verbalized understanding  HOME EXERCISE PROGRAM: Access Code: [email or web address] URL: [email or web address] Date: 07/03/2024 Prepared by: Mliss  Exercises - Supine Heel Slide  - 1 x daily - 7 x weekly - 2 sets - 10 reps - Beginner Bridge  - 1 x daily - 7 x weekly - 2 sets - 10 reps - Seated Hip Abduction  - 1 x daily - 7 x weekly - 2 sets - 10 reps - Sit to Stand with Counter Support  - 2 x daily - 7 x weekly - 2 sets - 5 repsTo be started  ASSESSMENT:  CLINICAL IMPRESSION: Pt arrives to aquatic Pt today with slightly improved LT knee pain post injection, but Lt back is still bothersome. She is awaiting a call from hospital where she will receive an MRI or CT scan of her back. 50% exercises in mechanical chair, 50% standing at side of pool for safety.   OBJECTIVE IMPAIRMENTS: decreased activity tolerance, decreased balance, decreased mobility, difficulty walking, decreased ROM, decreased strength, impaired perceived functional ability, impaired flexibility, and pain.   ACTIVITY LIMITATIONS: carrying, lifting, bending, standing, squatting, sleeping, stairs, transfers, hygiene/grooming, and locomotion level  PARTICIPATION LIMITATIONS: meal prep, cleaning, laundry, driving, shopping, and  community activity  PERSONAL FACTORS: Time since onset of injury/illness/exacerbation and 3+ comorbidities: cardiac history, leukemia, lymphedema, HTN are also affecting patient's functional outcome.   REHAB POTENTIAL: Good  CLINICAL DECISION MAKING: Evolving/moderate complexity  EVALUATION COMPLEXITY: Moderate   GOALS: Goals reviewed with patient? Yes  SHORT TERM GOALS: Target date: 06/08/2024    The patient will demonstrate knowledge of basic self care strategies and exercises to promote healing  Baseline: Goal status: MET  2.  The patient will report a 30% improvement in pain levels with functional activities which are currently difficult including night time and in the mornings Baseline:  Goal status: IN PROGRESS 11/24  3.  The patient will have improved Timed Up and Go (TUG) time to    20 sec   sec indicating improved gait speed and LE strength  Baseline:  Goal status: IN PROGRESS 11/24  4.  The patient will be able to ambulate 350 feet in 3 minutes needed for short community walking, medical appts Baseline:  Goal status: IN PROGRESS 11/24 183 FT in 3 min  5.  Improved LE strength improved as indicated by 5x STS test to 14 sec with min UE assist Baseline:  Goal status: IN PROGRESS 11/24    LONG TERM GOALS: Target date: 09/29/24    The patient will be independent in a safe self progression of a home exercise program to promote further recovery of function  Baseline:  Goal status: INITIAL  2.  The patient will report a 30% improvement in pain levels with functional activities which are currently difficult including night time and in the mornings Baseline:  Goal status: INITIAL  3.  The patient will have improved gait stamina and speed needed to ambulate 600 feet in 6 minutes  Baseline:  Goal status: INITIAL  4.  The patient will have improved Timed Up and Go (TUG) time to   17   sec indicating improved gait speed, decreased risk of falls and LE  strength  Baseline:  Goal status: INITIAL  5.  Modified Oswestry functional outcome measure score improved to  26  % indicating improved function with ADLS with less pain.   Baseline:  Goal status: INITIAL   PLAN:  PT FREQUENCY: 2x/week  PT DURATION: 12 weeks  PLANNED INTERVENTIONS: 97164- PT Re-evaluation, 97750- Physical Performance Testing, 97110-Therapeutic exercises, 97530- Therapeutic activity, 97112- Neuromuscular re-education, 97535- Self Care, [id number]- Manual therapy, [phone]- Aquatic Therapy, [phone] (1-2 muscles), [id number] (3+ muscles)- Dry Needling, Patient/Family education, Balance training, Stair training, Taping, Joint mobilization, Cryotherapy, and Moist heat.  PLAN FOR NEXT SESSION: Sees ortho MD Dec 17th, continue strengthening hip flexors especially for step ups, and general hip strengthening No ES (pacemaker/defibrillator)  Delon Darner, PTA 07/28/2024 3:08 PM    "

## 2024-07-28 NOTE — Therapy (Signed)
 " OUTPATIENT PHYSICAL THERAPY THORACOLUMBAR TREATMENT/RE-CERTIFICATION AND PROGRESS NOTE  Reporting Period 05/11/24 to 07/03/24   See note below for Objective Data and Assessment of Progress/Goals.    Patient Name: Lisa [name] MRN: [id number] DOB:[birth year], 83 y.o., female Today's Date: 07/28/2024  END OF SESSION:           Past Medical History:  Diagnosis Date   Acute on chronic systolic heart failure (HCC)    Acute renal failure superimposed on stage 3b chronic kidney disease (HCC) 05/22/2022   Bacteremia due to Pseudomonas 06/16/2022   CAD in native artery 2017   stent to LAD   Cardiac arrest (HCC) 09/2015   CHF (congestive heart failure) (HCC)    CML (chronic myelocytic leukemia) (HCC)    Complication of anesthesia    Hx: UTI (urinary tract infection)    Hypertension    Melanoma (HCC)    PONV (postoperative nausea and vomiting)    PVC (premature ventricular contraction)    HISTORY OF   Thyroid  disease    V tach (HCC)    Past Surgical History:  Procedure Laterality Date   ABDOMINAL HYSTERECTOMY     APPENDECTOMY     CARDIAC CATHETERIZATION N/A 10/03/2015   Procedure: Left Heart Cath and Coronary Angiography;  Surgeon: Peter M Jordan, MD;  Location: [facility] INVASIVE CV LAB;  Service: Cardiovascular;  Laterality: N/A;   CARDIAC CATHETERIZATION N/A 10/07/2015   Procedure: Coronary Stent Intervention;  Surgeon: Lonni JONETTA Cash, MD;  Location: [facility] INVASIVE CV LAB;  Service: Cardiovascular;  Laterality: N/A;   CARDIAC CATHETERIZATION  09/22/2019   EP IMPLANTABLE DEVICE N/A 01/20/2016   Procedure: BiV ICD Insertion CRT-D;  Surgeon: Danelle LELON Birmingham, MD;  Location: [facility] INVASIVE CV LAB;  Service: Cardiovascular;  Laterality: N/A;   ICD GENERATOR CHANGEOUT N/A 01/07/2024   Procedure: ICD GENERATOR CHANGEOUT;  Surgeon: Birmingham Danelle LELON, MD;  Location: [facility] INVASIVE CV LAB;  Service: Cardiovascular;  Laterality: N/A;   IR THORACENTESIS ASP PLEURAL SPACE W/IMG GUIDE   07/20/2023   IR THORACENTESIS ASP PLEURAL SPACE W/IMG GUIDE  12/02/2023   LEFT HEART CATH AND CORONARY ANGIOGRAPHY N/A 09/22/2019   Procedure: LEFT HEART CATH AND CORONARY ANGIOGRAPHY;  Surgeon: Jordan, Peter M, MD;  Location: [facility] INVASIVE CV LAB;  Service: Cardiovascular;  Laterality: N/A;   TONSILLECTOMY     TONSILLECTOMY     TUBAL LIGATION     Patient Active Problem List   Diagnosis Date Noted   ICD (implantable cardioverter-defibrillator) discharge 12/29/2023   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   Need for management of chest tube 12/06/2023   Hydropneumothorax 12/04/2023   Obesity, class 1 08/20/2023   Fall at home, initial encounter 08/09/2023   Generalized weakness 08/09/2023   Elevated troponin 08/09/2023   Anemia of chronic disease 08/09/2023   Acute on chronic diastolic heart failure (HCC) 08/08/2023   Cellulitis, leg 07/17/2023   Open wound of right lower extremity 07/17/2023   Respiratory distress 07/17/2023   Recurrent left pleural effusion 07/17/2023   Heart failure with reduced ejection fraction (HCC) 07/17/2023   Chronic kidney disease, stage III (moderate) (HCC) 07/17/2023   Obesity (BMI 30-39.9) 07/17/2023   Urine culture positive 06/19/2022   Transaminitis 06/17/2022   Viral gastroenteritis 06/16/2022   Venous stasis dermatitis of both lower extremities 05/26/2022   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 05/22/2022   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/21/2022   Paroxysmal ventricular tachycardia (HCC) 09/21/2019   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   Unstable angina (  HCC) 10/07/2015   Chronic systolic heart failure (HCC)    PVC's (premature ventricular contractions)    Ventricular fibrillation (HCC) 10/03/2015   History of cardiac arrest 10/03/2015   Essential hypertension 09/09/2015   Lymphedema 02/21/2015   Acquired hypothyroidism 02/21/2015    PCP: Claudene Pellet MD  REFERRING PROVIDER: Claudene Pellet MD  REFERRING DIAG:  M54.50 LBP  Rationale for Evaluation and Treatment: Rehabilitation  THERAPY DIAG:  Other low back pain  Difficulty in walking, not elsewhere classified  Muscle weakness (generalized)  ONSET DATE: approx 1 year  SUBJECTIVE:                                                                                                                                                                                           SUBJECTIVE STATEMENT: Everything hurts in her left hip. Seeing Dr. Vernetta on 07/26/24. The pool is the best thing. I go hobbling in, but come out feeling better.    PERTINENT HISTORY:  Leukemia oral chemo Pacemaker/debrillator CHF HTN Hospitalized in May for 1 week, fluid  2 falls in July: turning off light and tipped over on side;  the other fall was after got out of shower and turned and sat straight down (had to get son and caregiver helped get off the floor) caregiver 2x/week, alone 4 days/week HHPT 4-5 weeks walking with 4 WW, bands (red) for legs and arms  Blood clot in leg Lymphedema   PAIN:   07/28/2024  Are you having pain? Yes NPRS scale: 4-5/10 with sitting; night time really bad  Pain location: bil lumbar and left hip Pain orientation: Bilateral  PAIN TYPE: aching Pain description: intermittent  Aggravating factors: night, early AM, lying down  Relieving factors: sleep with heating pad; Tylenol , Blue Emu, Tiger Balm  PRECAUTIONS: ICD/Pacemaker    WEIGHT BEARING RESTRICTIONS: fall  FALLS:  Has patient fallen in last 6 months? Yes. Number of falls 2  LIVING ENVIRONMENT: Lives with: lives alone Lives in: House/apartment Stairs: No, 3 steps to get in the door (getting better) Has following equipment at home: Vannie - 4 wheeled, shower chair  OCCUPATIONrisk manager, real estate, currently volunteer for [employer]  PLOF: Independent with basic ADLs  PATIENT GOALS: want to have hydrotherapy; go as far as I can in mobility; walk without help but  I'm too off balance; be more flexible; get in car easier without hands assisting legs; energy to walk around grocery store  NEXT MD VISIT: as needed  OBJECTIVE:  Note: Objective measures were completed at Evaluation unless otherwise noted.  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  2. Personal care (washing, dressing, etc.) 0 =  I can take care of myself normally without causing increased pain.  3. Lifting 3 = Pain prevents me from lifting heavy weights, but I can manage (5) I have hardly any social life because of my pain. light to medium weights if they are conveniently positioned  4. Walking 3 =  Pain prevents me from walking more than  mile.  5. Sitting 1 =  I can only sit in my favorite chair as long as I like.  6. Standing 3 =  Pain prevents me from standing more than 1/2 hour.  7. Sleeping 1 = I can sleep well only by using pain medication.  8. Social Life 1 =  My social life is normal, but it increases my level of pain.  9. Traveling 1 =  I can travel anywhere, but it increases my pain.  10. Employment/ Homemaking 2 = I can perform most of my homemaking/job duties, but pain prevents me from performing more physically stressful activities (eg, lifting, vacuuming).  Total 34%   Interpretation of scores: Score Category Description  0-20% Minimal Disability The patient can cope with most living activities. Usually no treatment is indicated apart from advice on lifting, sitting and exercise  21-40% Moderate Disability The patient experiences more pain and difficulty with sitting, lifting and standing. Travel and social life are more difficult and they may be disabled from work. Personal care, sexual activity and sleeping are not grossly  affected, and the patient can usually be managed by conservative means  41-60% Severe Disability Pain remains the main problem in this group, but activities of daily living are affected. These patients require a detailed investigation  61-80% Crippled Back pain impinges on all aspects of the patients life. Positive intervention is required  81-100% Bed-bound  These patients are either bed-bound or exaggerating their symptoms  Bluford FORBES Zoe DELENA Karon DELENA, et al. Surgery versus conservative management of stable thoracolumbar fracture: the PRESTO feasibility RCT. [place] ([place]): [employer]; 2021 Nov. [employer] Assessment, No. 25.62.) Appendix 3, Oswestry Disability Index category descriptors. Available from: Findjewelers.cz  Minimally Clinically Important Difference (MCID) = 12.8%  Modified Oswestry: 07/03/24  22 / 50 = 44.0 %  COGNITION: Overall cognitive status: Within functional limits for tasks assessed     MUSCLE LENGTH: Decreased HS and hip flexor lengths bil POSTURE: tends to stand forward flexed slightly, reports decreased back pain with standing erect  LUMBAR ROM:   AROM eval  Flexion 50  Extension 10  Right lateral flexion   Left lateral flexion   Right rotation   Left rotation    (Blank rows = not tested)  TRUNK STRENGTH:  Decreased activation of transverse abdominus muscles; abdominals 4-/5; decreased activation of lumbar multifidi; trunk extensors 4-/5   LOWER EXTREMITY ROM:   lacks full knee extension bil;  hip flexion 70 degrees, hip extension 5 degrees, unable to achieve seated figure 4 (external rotation)  LOWER EXTREMITY MMT:  unable to rise from a standard height chair without significant UE assist; unable to SLS without heavy UE assist  MMT Right eval Left eval  Hip flexion 4- 4-  Hip extension 4- 4-  Hip abduction 3+ 3+  Hip adduction    Hip internal rotation  Hip external rotation    Knee flexion  4- 4-  Knee extension 3+ 3+  Ankle dorsiflexion 4- 4-  Ankle plantarflexion 4- 4-  Ankle inversion    Ankle eversion     (Blank rows = not tested)   FUNCTIONAL TESTS:  5x STS 15.43 sec shortness of breath, low back pain TUG with 4 WW hands assist 24.63 sec back pain < 5 2 MWT 222 feet RPE 5-6/10  O2 98%; strain feeling in back   07/03/24 TUG with 4WW hands assist  32.21 sec 5xSTS 19.17 sec no SOB reported, low back pain   GAIT: Comments: decreased gait speed, using 4 [employer]  TREATMENT DATE:  [facility] Adult PT Treatment:                                                DATE:   07/03/24 Discussion of current status, POC, recent fall, hip pain 5xsts TUG Mod oswestry Heel slides x 5 B Supine clam x 10 painful Hooklying hip ADD squeeze into ball x 10 some pain Supine piriformis stretch which foot over leg (top knee bent) x 30 sec painful Sit <>Supine transfers min A x 1     06/30/24:Pt seen for aquatic therapy today.  Treatment took place in water  3.5-4.75 ft in depth at the [facility] pool. Temp of water  was 91.  Pt entered and exited the pool via mechanical lift due to recent fall. -Seated in chair; LE movement in all planes 20x ach Bi -Standing at wall next to chair: 5 heel lifts, 10 marches, 10 side steps, 5 backwards steps: pt holding onto wall -lumbar stretch holding onto 1 rail: 3 breaths 3x; repeat later in session -lateral step holding the wall: 3 steps each direction 3x -holding xtra large noodle 75% depth forward walk 2 lengths back to mechanical chair.  -seated bicycle 45 sec 2x  06/21/24 Pt seen for aquatic therapy today.  Treatment took place in water  3.5-4.75 ft in depth at the [facility] pool. Temp of water  was 91.  Pt entered the pool via stairs step to pattern forward 3 steps then backward x 3 steps with heavy ue support on hand rail and CGA  -seated on bench: LAQ; cycling; marching; hip add/abd; ankle ROM -Standing at half wall: toe  raises x 15; heel raises x 10; hip add/abd 2 x 5 reps  - seated rest period -Side stepping R/L x 10 ft ue support barbell along pool side x 6 laps -Ue support wall 3.8 ft: high knee marching; hip extension 2 x 5 reps -forward amb using barbell  4 laps across pool with short rest period on each side Stair negotiation out of pool using step to pattern leading with rle.  VC for forward weigth shift engaging right hip to reduce ue support on hand rails.  Good execution, still requires heavy ue support as submersion is reduced (last 3 steps) and CGA.   Pt requires the buoyancy and hydrostatic pressure of water  for support, and to offload joints by unweighting joint load by at least 50 % in navel deep water  and by at least 75-80% in chest to neck deep water .  Viscosity of the water  is needed for resistance of strengthening. Water  current perturbations provides challenge to standing balance requiring increased core activation.     06/16/24:Pt arrives for aquatic physical therapy.  Treatment took place in 3.5-5.5 feet of water . Water  temperature was 91 degrees F. Pt entered the pool via stairs with extra time, step to pattern and heavy use of rails. Pt requires buoyancy of water  for support and to offload joints with strengthening exercises.  Seated at bench: VC to safely get into seated position. Allowed pt 2 min to relax, focus on breathing naturally/slowly in order to settle into the water  especially at her hips. Once settled: Knee kicks 1 min 2x, ankle pumps Bil 2x20, marching 20x2, hip abd/add 20x2  Standing at half wall (near where she was sitting) heel raises 15x, side steps Bil 15x, step back 15x each leg, mini squats 15x. All holding onto wall for balance.  Holding yellow noodle: walk from half wall to other side of the wall and back, VC for control and to start slow; sit to stand with yellow noodle x6 from bench: rest  Back stretch holding the rails: hold 2 sec 10x. Close SBA to exit pool at stairs,  pt did not want the lift chair.    06/02/24:Pt arrives for aquatic physical therapy. Treatment took place in 3.5-5.5 feet of water . Water  temperature was 91 degrees F. Pt entered the pool via stairs with extra time, step to pattern and heavy use of rails. Pt requires buoyancy of water  for support and to offload joints with strengthening exercises.  Seated at bench: VC to safely get into seated position. Allowed pt 2 min to relax, focus on breathing naturally/slowly in order to settle into the water  especially at her hips. Once settled: Knee kicks 1 min 2x, ankle pumps Bil 2x20, marching 15x,  Standing at half wall (near where she was sitting) heel raises 15x, side steps Bil 15x, step back 15x each leg, mini squats 15x. All holding onto wall for balance.  Holding yellow noodle: walk from half wall to other side of the water  bench: sit to rest about 30 sec, sit to stand  with yellow noodle2x6: rest:walk back towards lift chair and back to the bench. Rest and repeat walk. Back stretch holding the rails: hold 5 sec 4x. Close SBA to exit pool at stairs, pt did not want the lift chair.                                                                                                           PATIENT EDUCATION:  Education details: Educated patient on anatomy and physiology of current symptoms, prognosis, plan of care as well as initial self care strategies to promote recovery Person educated: Patient Education method: Explanation Education comprehension: verbalized understanding  HOME EXERCISE PROGRAM: Access Code: [email or web address] URL: [email or web address] Date: 07/03/2024 Prepared by: Mliss  Exercises - Supine Heel Slide  - 1 x daily - 7 x weekly - 2 sets - 10 reps - Beginner Bridge  - 1 x daily - 7 x weekly - 2 sets - 10 reps - Seated Hip Abduction  - 1 x daily - 7 x weekly - 2 sets - 10 reps - Sit to Stand with Counter Support  -  2 x daily - 7 x weekly - 2 sets - 5 repsTo be  started  ASSESSMENT:  CLINICAL IMPRESSION:   OBJECTIVE IMPAIRMENTS: decreased activity tolerance, decreased balance, decreased mobility, difficulty walking, decreased ROM, decreased strength, impaired perceived functional ability, impaired flexibility, and pain.   ACTIVITY LIMITATIONS: carrying, lifting, bending, standing, squatting, sleeping, stairs, transfers, hygiene/grooming, and locomotion level  PARTICIPATION LIMITATIONS: meal prep, cleaning, laundry, driving, shopping, and community activity  PERSONAL FACTORS: Time since onset of injury/illness/exacerbation and 3+ comorbidities: cardiac history, leukemia, lymphedema, HTN are also affecting patient's functional outcome.   REHAB POTENTIAL: Good  CLINICAL DECISION MAKING: Evolving/moderate complexity  EVALUATION COMPLEXITY: Moderate   GOALS: Goals reviewed with patient? Yes  SHORT TERM GOALS: Target date: 06/08/2024    The patient will demonstrate knowledge of basic self care strategies and exercises to promote healing  Baseline: Goal status: MET  2.  The patient will report a 30% improvement in pain levels with functional activities which are currently difficult including night time and in the mornings Baseline:  Goal status: IN PROGRESS 11/24  3.  The patient will have improved Timed Up and Go (TUG) time to    20 sec   sec indicating improved gait speed and LE strength  Baseline:  Goal status: IN PROGRESS 11/24  4.  The patient will be able to ambulate 350 feet in 3 minutes needed for short community walking, medical appts Baseline:  Goal status: IN PROGRESS 11/24 183 FT in 3 min  5.  Improved LE strength improved as indicated by 5x STS test to 14 sec with min UE assist Baseline:  Goal status: IN PROGRESS 11/24    LONG TERM GOALS: Target date: 09/29/24    The patient will be independent in a safe self progression of a home exercise program to promote further recovery of function  Baseline:  Goal status:  INITIAL  2.  The patient will report a 30% improvement in pain levels with functional activities which are currently difficult including night time and in the mornings Baseline:  Goal status: INITIAL  3.  The patient will have improved gait stamina and speed needed to ambulate 600 feet in 6 minutes  Baseline:  Goal status: INITIAL  4.  The patient will have improved Timed Up and Go (TUG) time to   17   sec indicating improved gait speed, decreased risk of falls and LE strength  Baseline:  Goal status: INITIAL  5.  Modified Oswestry functional outcome measure score improved to   26  % indicating improved function with ADLS with less pain.   Baseline:  Goal status: INITIAL   PLAN:  PT FREQUENCY: 2x/week  PT DURATION: 12 weeks  PLANNED INTERVENTIONS: 97164- PT Re-evaluation, 97750- Physical Performance Testing, 97110-Therapeutic exercises, 97530- Therapeutic activity, 97112- Neuromuscular re-education, 97535- Self Care, 02859- Manual therapy, [phone]- Aquatic Therapy, [id number] (1-2 muscles), 20561 (3+ muscles)- Dry Needling, Patient/Family education, Balance training, Stair training, Taping, Joint mobilization, Cryotherapy, and Moist heat.  PLAN FOR NEXT SESSION: Sees ortho MD Dec 17th, continue strengthening hip flexors especially for step ups, and general hip strengthening No ES (pacemaker/defibrillator)  Delon Darner, PTA 07/28/2024 12:04 PM    "

## 2024-07-31 ENCOUNTER — Ambulatory Visit: Attending: Internal Medicine

## 2024-07-31 DIAGNOSIS — Z95810 Presence of automatic (implantable) cardiac defibrillator: Secondary | ICD-10-CM

## 2024-07-31 DIAGNOSIS — I5022 Chronic systolic (congestive) heart failure: Secondary | ICD-10-CM

## 2024-07-31 NOTE — Progress Notes (Signed)
 EPIC Encounter for ICM Monitoring  Patient Name: Lisa [name] is a 83 y.o. female Date: 07/31/2024 Primary Care Physican: Claudene Pellet, MD Primary Cardiologist: Okey Electrophysiologist: Waddell Pore Pacing: 98.4%         11/12/2021 Weight: 248 lbs 01/21/2022 Weight: 246 lbs 09/01/2022 Office Weight: 205 lbs 04/23/2023 Office Weight: 206 lbs 09/02/2023 Weight: 170 lbs 09/14/2023 Weight: 172 lbs 03/02/2024 Weight: 141.8 lbs  04/06/2024 Weight: 131.4 lbs       06/09/2024 Office Weight: 119 lbs      07/24/2024 Weight: 119-120 lbs                                       Spoke with patient and heart failure questions reviewed.  Transmission results reviewed.  Pt reports feet are still swollen but did not take extra Torsemide  last week.     Diet:  Reviews labels for salt contents and buys low salt foods.     Since 07/24/2024 ICM Remote Transmission: Optivol thoracic impedance suggesting possible fluid accumulation continues to be intermittent with a couple of days at baseline.    Prescribed:  Torsemide  20 mg take 1 tablet(s) (20 mg total) by mouth daily.   11/16/2023 She reports Dr Waddell advised, at 11/01/2023 OV, she can take an extra Torsemide  about 1-2 times a week if needed.     Labs: 06/15/2024 Creatinine 1.57, BUN 36, Potassium 4.3, Sodium 134, GFR 33 06/05/2024 Creatinine 1.45, BUN 33, Potassium 4.5, Sodium 135, GFR 36 03/02/2024 Creatinine 1.84, BUN 26, Potassium 4.2, Sodium 133, GFR 27  01/08/2024 Creatinine 1.47, BUN 18, Potassium 4.4, Sodium 134, GFR 36  01/07/2024 Creatinine 1.43, BUN 19, Potassium 4.4, Sodium 134, GFR 37  01/06/2024 Creatinine 1.54, BUN 21, Potassium 3.3, Sodium 137, GFR 34 01/05/2024 Creatinine 1.54, BUN 23, Potassium 3.8, Sodium 134, GFR 34  A complete set of results can be found in Results Review.   Recommendations:  She forgot to take extra Torsemide  last week as recommended.  Advised to take 1 extra Torsemide  tomorrow and again on Saturday 08/05/2024.    Follow-up plan: ICM clinic phone appointment on 08/07/2024 to recheck fluid levels.   91 day device clinic remote transmission 08/29/2024.     EP/Cardiology Office Visits:  Recall 09/19/2024 with Dr Almetta.    Copy sent to Dr Okey as RICK.    Copy of ICM check sent to Dr. Waddell.     Remote monitoring is medically necessary for Heart Failure Management.    Daily Thoracic Impedance ICM trend: 05/01/2024 through 07/31/2024.    12-14 Month Thoracic Impedance ICM trend:     Mitzie GORMAN Garner, RN 07/31/2024 4:47 PM

## 2024-08-04 ENCOUNTER — Telehealth: Payer: Self-pay | Admitting: Orthopaedic Surgery

## 2024-08-04 NOTE — Telephone Encounter (Signed)
 Pt called saying that she was scheduled to have and MRI done, but she was contacted by the place that was supposed to do it and told that she has to have it done at the hospital because she has a defibrillator. Call back number is [phone]

## 2024-08-07 ENCOUNTER — Ambulatory Visit: Attending: Cardiology

## 2024-08-07 DIAGNOSIS — Z95810 Presence of automatic (implantable) cardiac defibrillator: Secondary | ICD-10-CM

## 2024-08-07 DIAGNOSIS — I5022 Chronic systolic (congestive) heart failure: Secondary | ICD-10-CM

## 2024-08-07 NOTE — Progress Notes (Signed)
 EPIC Encounter for ICM Monitoring  Patient Name: Lisa [name] is a 83 y.o. female Date: 08/07/2024 Primary Care Physican: Claudene Pellet, MD Primary Cardiologist: Okey Electrophysiologist: Inocencio Bi-V Pacing: 98.1%         11/12/2021 Weight: 248 lbs 01/21/2022 Weight: 246 lbs 09/01/2022 Office Weight: 205 lbs 04/23/2023 Office Weight: 206 lbs 09/02/2023 Weight: 170 lbs 09/14/2023 Weight: 172 lbs 03/02/2024 Weight: 141.8 lbs  04/06/2024 Weight: 131.4 lbs       06/09/2024 Office Weight: 119 lbs      07/24/2024 Weight: 119-120 lbs                                       Spoke with patient and heart failure questions reviewed.  Transmission results reviewed.  Pt reports feet/ankles and legs swollen and dermatologist wrapped both legs today due to significant swelling.     Diet:  Reviews labels for salt contents and buys low salt foods.     Since 07/31/2024 ICM Remote Transmission: Optivol thoracic impedance suggesting possible fluid accumulation returned to normal.    Prescribed:  Torsemide  20 mg take 1 tablet(s) (20 mg total) by mouth daily.   11/16/2023 She reports Dr Waddell advised, at 11/01/2023 OV, she can take an extra Torsemide  about 1-2 times a week if needed.     Labs: 06/15/2024 Creatinine 1.57, BUN 36, Potassium 4.3, Sodium 134, GFR 33 06/05/2024 Creatinine 1.45, BUN 33, Potassium 4.5, Sodium 135, GFR 36 03/02/2024 Creatinine 1.84, BUN 26, Potassium 4.2, Sodium 133, GFR 27  01/08/2024 Creatinine 1.47, BUN 18, Potassium 4.4, Sodium 134, GFR 36  01/07/2024 Creatinine 1.43, BUN 19, Potassium 4.4, Sodium 134, GFR 37  01/06/2024 Creatinine 1.54, BUN 21, Potassium 3.3, Sodium 137, GFR 34 01/05/2024 Creatinine 1.54, BUN 23, Potassium 3.8, Sodium 134, GFR 34  A complete set of results can be found in Results Review.   Recommendations:  No changes and encouraged to call if experiencing any fluid symptoms..   Follow-up plan: ICM clinic phone appointment on 08/30/2024.   91 day device  clinic remote transmission 08/29/2024.     EP/Cardiology Office Visits:  Recall 09/19/2024 with Dr Almetta.    Recall 10/06/2024 with Dr Okey.   Copy of ICM check sent to Dr. Inocencio    Remote monitoring is medically necessary for Heart Failure Management.    Daily Thoracic Impedance ICM trend: 05/08/2024 through 08/07/2024.    12-14 Month Thoracic Impedance ICM trend:     Mitzie GORMAN Garner, RN 08/07/2024 2:53 PM

## 2024-08-07 NOTE — Telephone Encounter (Signed)
 LMOM for patient that we actually changed it to a CT Myelo and they should be calling her to schedule this

## 2024-08-11 ENCOUNTER — Ambulatory Visit: Admitting: Physical Therapy

## 2024-08-16 ENCOUNTER — Ambulatory Visit (HOSPITAL_BASED_OUTPATIENT_CLINIC_OR_DEPARTMENT_OTHER): Admitting: Physical Therapy

## 2024-08-18 ENCOUNTER — Ambulatory Visit: Admitting: Physical Therapy

## 2024-08-21 ENCOUNTER — Other Ambulatory Visit: Payer: Self-pay | Admitting: Orthopaedic Surgery

## 2024-08-21 DIAGNOSIS — R29898 Other symptoms and signs involving the musculoskeletal system: Secondary | ICD-10-CM

## 2024-08-22 ENCOUNTER — Ambulatory Visit (HOSPITAL_BASED_OUTPATIENT_CLINIC_OR_DEPARTMENT_OTHER): Attending: Family Medicine | Admitting: Physical Therapy

## 2024-08-22 ENCOUNTER — Emergency Department (HOSPITAL_COMMUNITY)

## 2024-08-22 ENCOUNTER — Ambulatory Visit (HOSPITAL_BASED_OUTPATIENT_CLINIC_OR_DEPARTMENT_OTHER): Admitting: Physical Therapy

## 2024-08-22 ENCOUNTER — Emergency Department (HOSPITAL_COMMUNITY)
Admission: EM | Admit: 2024-08-22 | Discharge: 2024-08-23 | Disposition: A | Source: Home / Self Care | Attending: Emergency Medicine | Admitting: Emergency Medicine

## 2024-08-22 ENCOUNTER — Other Ambulatory Visit: Payer: Self-pay

## 2024-08-22 ENCOUNTER — Encounter (HOSPITAL_BASED_OUTPATIENT_CLINIC_OR_DEPARTMENT_OTHER): Payer: Self-pay | Admitting: Physical Therapy

## 2024-08-22 ENCOUNTER — Encounter (HOSPITAL_COMMUNITY): Payer: Self-pay | Admitting: Emergency Medicine

## 2024-08-22 DIAGNOSIS — I890 Lymphedema, not elsewhere classified: Secondary | ICD-10-CM | POA: Insufficient documentation

## 2024-08-22 DIAGNOSIS — M5459 Other low back pain: Secondary | ICD-10-CM | POA: Insufficient documentation

## 2024-08-22 DIAGNOSIS — Z7901 Long term (current) use of anticoagulants: Secondary | ICD-10-CM | POA: Insufficient documentation

## 2024-08-22 DIAGNOSIS — D649 Anemia, unspecified: Secondary | ICD-10-CM | POA: Insufficient documentation

## 2024-08-22 DIAGNOSIS — W01198A Fall on same level from slipping, tripping and stumbling with subsequent striking against other object, initial encounter: Secondary | ICD-10-CM | POA: Insufficient documentation

## 2024-08-22 DIAGNOSIS — W19XXXA Unspecified fall, initial encounter: Secondary | ICD-10-CM

## 2024-08-22 DIAGNOSIS — I509 Heart failure, unspecified: Secondary | ICD-10-CM | POA: Insufficient documentation

## 2024-08-22 DIAGNOSIS — N189 Chronic kidney disease, unspecified: Secondary | ICD-10-CM | POA: Insufficient documentation

## 2024-08-22 DIAGNOSIS — R262 Difficulty in walking, not elsewhere classified: Secondary | ICD-10-CM | POA: Insufficient documentation

## 2024-08-22 DIAGNOSIS — S0003XA Contusion of scalp, initial encounter: Secondary | ICD-10-CM | POA: Insufficient documentation

## 2024-08-22 DIAGNOSIS — M6281 Muscle weakness (generalized): Secondary | ICD-10-CM | POA: Insufficient documentation

## 2024-08-22 DIAGNOSIS — I2510 Atherosclerotic heart disease of native coronary artery without angina pectoris: Secondary | ICD-10-CM | POA: Insufficient documentation

## 2024-08-22 LAB — BASIC METABOLIC PANEL WITH GFR
Anion gap: 10 (ref 5–15)
BUN: 27 mg/dL — ABNORMAL HIGH (ref 8–23)
CO2: 25 mmol/L (ref 22–32)
Calcium: 9 mg/dL (ref 8.9–10.3)
Chloride: 102 mmol/L (ref 98–111)
Creatinine, Ser: 1.35 mg/dL — ABNORMAL HIGH (ref 0.44–1.00)
GFR, Estimated: 39 mL/min — ABNORMAL LOW
Glucose, Bld: 97 mg/dL (ref 70–99)
Potassium: 4.3 mmol/L (ref 3.5–5.1)
Sodium: 137 mmol/L (ref 135–145)

## 2024-08-22 LAB — CBC
HCT: 31.6 % — ABNORMAL LOW (ref 36.0–46.0)
Hemoglobin: 10.2 g/dL — ABNORMAL LOW (ref 12.0–15.0)
MCH: 30 pg (ref 26.0–34.0)
MCHC: 32.3 g/dL (ref 30.0–36.0)
MCV: 92.9 fL (ref 80.0–100.0)
Platelets: 229 K/uL (ref 150–400)
RBC: 3.4 MIL/uL — ABNORMAL LOW (ref 3.87–5.11)
RDW: 15.1 % (ref 11.5–15.5)
WBC: 9 K/uL (ref 4.0–10.5)
nRBC: 0 % (ref 0.0–0.2)

## 2024-08-22 LAB — PROTIME-INR
INR: 1.8 — ABNORMAL HIGH (ref 0.8–1.2)
Prothrombin Time: 21.4 s — ABNORMAL HIGH (ref 11.4–15.2)

## 2024-08-22 MED ORDER — ACETAMINOPHEN 325 MG PO TABS
650.0000 mg | ORAL_TABLET | Freq: Once | ORAL | Status: AC
  Administered 2024-08-22: 650 mg via ORAL
  Filled 2024-08-22: qty 2

## 2024-08-22 NOTE — Progress Notes (Signed)
 Orthopedic Tech Progress Note Patient Details:  Lisa [name] 03-08-1941 [id number]  Patient ID: [name], female   DOB: [birth year], 84 y.o.   MRN: [id number] Checked in for level 2 trauma.  [name] 08/22/2024, 10:08 PM

## 2024-08-22 NOTE — ED Provider Notes (Signed)
 " [facility] AT [facility] Provider Note   CSN: [id number] Arrival date & time: 08/22/24  2128     Patient presents with: fall on thinners   Lisa [name] is a 84 y.o. female.  Presents today as a fall on thinners for an unwitnessed fall.  Patient lives alone and states that she was seated on her rollator and it rolled out from underneath her.  Patient states she fell and hit her head on the walker.  Patient reports mild neck pain.  Patient denies LOC, numbness, weakness, diplopia, tinnitus, or any other complaints at this time.   HPI     Prior to Admission medications  Medication Sig Start Date End Date Taking? Authorizing Provider  acetaminophen  (TYLENOL ) 500 MG tablet Take 1,000 mg by mouth every 6 (six) hours as needed for moderate pain (pain score 4-6).    [provider]  allopurinol  (ZYLOPRIM ) 100 MG tablet Take 100 mg by mouth daily.  06/15/17   [provider]  amiodarone  (PACERONE ) 200 MG tablet Take 1 tablet (200 mg total) by mouth at bedtime. 01/20/24   Leverne Charlies Helling, PA-C  asciminib hcl (SCEMBLIX) 40 MG tablet Take 40 mg by mouth daily. 03/04/24   [provider]  atorvastatin  (LIPITOR) 20 MG tablet Take 1 tablet (20 mg total) by mouth daily. 05/07/16   Bensimhon, Toribio SAUNDERS, MD  calcium  carbonate (OS-CAL - DOSED IN MG OF ELEMENTAL CALCIUM ) 1250 (500 Ca) MG tablet Take 1 tablet by mouth daily as needed (Calcium  levels).    [provider]  Cholecalciferol  (VITAMIN D -3 PO) Take 5,000 Units by mouth daily.    [provider]  Cyanocobalamin (VITAMIN B12 SL) Place 1 tablet under the tongue daily.    [provider]  dabigatran  (PRADAXA ) 75 MG CAPS capsule Take 1 capsule (75 mg total) by mouth 2 (two) times daily. 03/07/24   Okey Vina GAILS, MD  loperamide  (IMODIUM ) 2 MG capsule Take 2 mg by mouth as needed.    [provider]  MAGNESIUM  GLYCINATE PO Take 1 tablet by mouth at bedtime.     [provider]  nitroGLYCERIN  (NITROSTAT ) 0.4 MG SL tablet Place 1 tablet (0.4 mg total) under the tongue every 5 (five) minutes x 3 doses as needed for chest pain. 06/09/24   Okey Vina GAILS, MD  Pitavastatin Calcium  4 MG TABS Take 1 tablet by mouth daily.    [provider]  potassium chloride  (KLOR-CON  M) 10 MEQ tablet Take 1 tablet (10 mEq total) by mouth as needed. Patient taking differently: Take 10 mEq by mouth 2 (two) times daily. 12/20/23   Okey Vina GAILS, MD  thyroid  (ARMOUR) 15 MG tablet Take 15 mg by mouth daily before breakfast.     [provider]  timolol  (BETIMOL ) 0.5 % ophthalmic solution Place 1 drop into both eyes 2 (two) times daily.    [provider]  torsemide  (DEMADEX ) 20 MG tablet Take 1 tablet (20 mg total) by mouth daily. 03/14/24   Okey Vina GAILS, MD    Allergies: Flagyl [metronidazole], Penicillins, Sulfamethoxazole, Dilaudid  [hydromorphone  hcl], Meperidine, Propoxyphene, Tape, Doxycycline , Lanolin, and Sulfa antibiotics    Review of Systems  Musculoskeletal:  Positive for arthralgias and neck pain.    Updated Vital Signs BP (!) 178/88 (BP Location: Left Arm)   Pulse 77   Temp (!) 97.5 F (36.4 C) (Oral)   Resp 18   Ht 5' 1.5 (1.562 m)   Wt  54.4 kg   LMP 02/14/1995   SpO2 100%   BMI 22.31 kg/m   Physical Exam Vitals and nursing note reviewed.  Constitutional:      General: She is not in acute distress.    Appearance: Normal appearance. She is well-developed. She is not toxic-appearing.  HENT:     Head: Normocephalic.     Comments: Patient has small hematoma noted to posterior occiput just right of midline.    Right Ear: External ear normal.     Left Ear: External ear normal.     Mouth/Throat:     Mouth: Mucous membranes are moist.     Pharynx: Oropharynx is clear.  Eyes:     Extraocular Movements: Extraocular movements intact.     Conjunctiva/sclera: Conjunctivae normal.     Pupils: Pupils are equal, round, and  reactive to light.  Cardiovascular:     Rate and Rhythm: Normal rate and regular rhythm.     Pulses: Normal pulses.     Heart sounds: Normal heart sounds. No murmur heard. Pulmonary:     Effort: Pulmonary effort is normal. No respiratory distress.     Breath sounds: Normal breath sounds.  Abdominal:     Palpations: Abdomen is soft.     Tenderness: There is no abdominal tenderness.  Musculoskeletal:        General: No swelling.     Cervical back: Neck supple.  Skin:    General: Skin is warm and dry.     Capillary Refill: Capillary refill takes less than 2 seconds.     Comments: Lymphedema and chronic venous stasis changes of bilateral lower extremities.  No erythema, warmth, other signs of acute infection.  Neurological:     General: No focal deficit present.     Mental Status: She is alert and oriented to person, place, and time.     Comments: Patient has equal bilateral grip strength and slightly weaker left lower extremity strength which patient states is her baseline.  Patient is neurovascularly intact with +2 dorsalis pedis pulses.  Psychiatric:        Mood and Affect: Mood normal.     (all labs ordered are listed, but only abnormal results are displayed) Labs Reviewed  CBC - Abnormal; Notable for the following components:      Result Value   RBC 3.40 (*)    Hemoglobin 10.2 (*)    HCT 31.6 (*)    All other components within normal limits  BASIC METABOLIC PANEL WITH GFR - Abnormal; Notable for the following components:   BUN 27 (*)    Creatinine, Ser 1.35 (*)    GFR, Estimated 39 (*)    All other components within normal limits  PROTIME-INR - Abnormal; Notable for the following components:   Prothrombin Time 21.4 (*)    INR 1.8 (*)    All other components within normal limits    EKG: None  Radiology: CT Lumbar Spine Wo Contrast Result Date: 08/22/2024 CLINICAL DATA:  Back trauma, fell EXAM: CT LUMBAR SPINE WITHOUT CONTRAST TECHNIQUE: Multidetector CT imaging of  the lumbar spine was performed without intravenous contrast administration. Multiplanar CT image reconstructions were also generated. RADIATION DOSE REDUCTION: This exam was performed according to the departmental dose-optimization program which includes automated exposure control, adjustment of the mA and/or kV according to patient size and/or use of iterative reconstruction technique. COMPARISON:  06/05/2024 FINDINGS: Segmentation: 5 lumbar type vertebrae. Alignment: Degenerative retrolisthesis of L2 on L3. Otherwise alignment is anatomic. Vertebrae: No  acute fracture or focal pathologic process. Paraspinal and other soft tissues: Paraspinal soft tissues are unremarkable. Atherosclerosis of the aorta and its distal branches. Disc levels: Findings at individual levels are as follows: L1-2: Unremarkable. L2-3: There is a circumferential disc bulge, with a right paracentral disc extrusion extending inferiorly measuring 0.8 x 1.5 cm in transverse direction and extending 1.7 cm in craniocaudal length. This results in moderate central canal stenosis and severe right lateral recess narrowing. There is bilateral neural foraminal narrowing. L3-4: Circumferential disc bulge and bilateral facet hypertrophy with symmetrical bilateral neural foraminal encroachment and mild central canal stenosis. L4/L5: Mild circumferential disc bulge and facet hypertrophy without compressive sequela. L5-S1: Unremarkable. Reconstructed images demonstrate no additional findings. IMPRESSION: 1. No acute lumbar spine fracture. 2. 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 This is new since the prior exam. Electronically Signed   By: Ozell Daring M.D.   On: 08/22/2024 22:53   CT Cervical Spine Wo Contrast Result Date: 08/22/2024 CLINICAL DATA:  Clemens, neck trauma EXAM: CT CERVICAL SPINE WITHOUT CONTRAST TECHNIQUE:  Multidetector CT imaging of the cervical spine was performed without intravenous contrast. Multiplanar CT image reconstructions were also generated. RADIATION DOSE REDUCTION: This exam was performed according to the departmental dose-optimization program which includes automated exposure control, adjustment of the mA and/or kV according to patient size and/or use of iterative reconstruction technique. COMPARISON:  08/08/2023 FINDINGS: Alignment: Alignment is anatomic. Skull base and vertebrae: No acute fracture. No primary bone lesion or focal pathologic process. Soft tissues and spinal canal: No prevertebral fluid or swelling. No visible canal hematoma. Disc levels: There is mild diffuse spondylosis and facet hypertrophy. Partial bony fusion across the left C2-3 facet. Upper chest: Airway is patent. Partially loculated pleural effusion is seen at the left apex. Other: Reconstructed images demonstrate no additional findings. IMPRESSION: 1. No acute cervical spine fracture. 2. Mild diffuse cervical degenerative changes. 3. Partially loculated left pleural effusion. Electronically Signed   By: Ozell Daring M.D.   On: 08/22/2024 22:49   CT Head Wo Contrast Result Date: 08/22/2024 CLINICAL DATA:  Head trauma, fell, anticoagulated EXAM: CT HEAD WITHOUT CONTRAST TECHNIQUE: Contiguous axial images were obtained from the base of the skull through the vertex without intravenous contrast. RADIATION DOSE REDUCTION: This exam was performed according to the departmental dose-optimization program which includes automated exposure control, adjustment of the mA and/or kV according to patient size and/or use of iterative reconstruction technique. COMPARISON:  08/08/2023 FINDINGS: Brain: Confluent hypodensities throughout the periventricular white matter consistent with chronic small vessel ischemic changes. No evidence of acute infarct or hemorrhage. The lateral ventricles and midline structures are unremarkable. No acute  extra-axial fluid collections. No mass effect. Vascular: No hyperdense vessel or unexpected calcification. Skull: Normal. Negative for fracture or focal lesion. Sinuses/Orbits: No acute finding. Other: None. IMPRESSION: 1. Stable head CT, no acute intracranial process. Electronically Signed   By: Ozell Daring M.D.   On: 08/22/2024 22:45   DG Shoulder Right Result Date: 08/22/2024 CLINICAL DATA:  Clemens EXAM: RIGHT SHOULDER - 2+ VIEW COMPARISON:  None Available. FINDINGS: Internal rotation, external rotation, and transscapular views of the right shoulder are obtained. No acute fracture, subluxation, or dislocation. 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 The right chest is clear. IMPRESSION: 1. No acute displaced fracture. 2. Osteoarthritis. Electronically Signed   By: Ozell Daring M.D.   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 Patient knows about this and is scheduled with orthopedics.  [CC]    Clinical Course User Index [CC] Jerral Meth, MD                                 Medical Decision Making Amount and/or Complexity of Data Reviewed Labs: ordered. Radiology: ordered.   This patient presents to the ED for concern of fall, this involves an extensive number of treatment options, and is a complaint that carries with it a high risk of complications and morbidity.  The differential diagnosis includes fracture, dislocation, cauda equina syndrome, spinal cord injury, vertebral fracture, brain bleed, concussion   Co morbidities / Chronic conditions that complicate the patient evaluation  CAD, CHF, CKD   Additional history obtained:  Additional history obtained from EMR External records from outside source obtained and  reviewed including dermatology notes   Lab Tests:  I Ordered, and personally interpreted labs.  The pertinent results include: Anemia at 10.2, elevated pro time and INR consistent with anticoagulation use, elevated bun at 27 and elevated creatinine at 1.35 which are around patient's baseline   Imaging Studies ordered:  I ordered imaging studies including CT head and C-spine Noncon I independently visualized and interpreted imaging which showed no acute cervical spine fracture, partially loculated left pleural effusion stable head CT no acute intracranial process. I agree with the radiologist interpretation Right shoulder x-ray: No acute displaced fracture, osteoarthritis. CT lumbar Noncon: No acute lumbar spine fracture.  Inferiorly oriented right paracentral disc extrusion  at the L2 2-3 level.  New since prior exam.   Problem List / ED Course / Critical interventions / Medication management I ordered medication including Tylenol  I have reviewed the patients home medicines and have made adjustments as needed  Test / Admission - Considered:  For admission or further workup however patient's vital signs, physical exam, labs, and imaging are reassuring.  Patient has no signs or symptoms concerning for cauda equina syndrome, spinal cord injury, vertebral fracture, or brain bleed.  Patient symptoms likely due to musculoskeletal pain.  Patient already aware of disc protrusion of the lumbar spine and scheduled for CT myelogram through her orthopedist.  Patient given return precautions.  I feel patient is safe for discharge at this time.     Final diagnoses:  Fall, initial encounter    ED Discharge Orders     None          Francis Ileana SAILOR, PA-C 08/23/24 0001  "

## 2024-08-22 NOTE — Therapy (Signed)
 " OUTPATIENT PHYSICAL THERAPY THORACOLUMBAR TREATMENT NOTE     Patient Name: Lisa [name] MRN: [id number] DOB:1941-01-30, 84 y.o., female Today's Date: 08/22/2024  END OF SESSION:  PT End of Session - 08/22/24 1454     Visit Number 10    Date for Recertification  09/29/24    Authorization Type Medicare    Progress Note Due on Visit 18    PT Start Time 1450    PT Stop Time 1530    PT Time Calculation (min) 40 min    Activity Tolerance Patient tolerated treatment well    Behavior During Therapy [facility] for tasks assessed/performed                   Past Medical History:  Diagnosis Date   Acute on chronic systolic heart failure (HCC)    Acute renal failure superimposed on stage 3b chronic kidney disease (HCC) 05/22/2022   Bacteremia due to Pseudomonas 06/16/2022   CAD in native artery 2017   stent to LAD   Cardiac arrest (HCC) 09/2015   CHF (congestive heart failure) (HCC)    CML (chronic myelocytic leukemia) (HCC)    Complication of anesthesia    Hx: UTI (urinary tract infection)    Hypertension    Melanoma (HCC)    PONV (postoperative nausea and vomiting)    PVC (premature ventricular contraction)    HISTORY OF   Thyroid  disease    V tach (HCC)    Past Surgical History:  Procedure Laterality Date   ABDOMINAL HYSTERECTOMY     APPENDECTOMY     CARDIAC CATHETERIZATION N/A 10/03/2015   Procedure: Left Heart Cath and Coronary Angiography;  Surgeon: Peter M Jordan, MD;  Location: [facility] INVASIVE CV LAB;  Service: Cardiovascular;  Laterality: N/A;   CARDIAC CATHETERIZATION N/A 10/07/2015   Procedure: Coronary Stent Intervention;  Surgeon: Lonni JONETTA Cash, MD;  Location: [facility] INVASIVE CV LAB;  Service: Cardiovascular;  Laterality: N/A;   CARDIAC CATHETERIZATION  09/22/2019   EP IMPLANTABLE DEVICE N/A 01/20/2016   Procedure: BiV ICD Insertion CRT-D;  Surgeon: Danelle LELON Birmingham, MD;  Location: [facility] INVASIVE CV LAB;  Service: Cardiovascular;  Laterality: N/A;   ICD  GENERATOR CHANGEOUT N/A 01/07/2024   Procedure: ICD GENERATOR CHANGEOUT;  Surgeon: Birmingham Danelle LELON, MD;  Location: [facility] INVASIVE CV LAB;  Service: Cardiovascular;  Laterality: N/A;   IR THORACENTESIS RIGHT ASP PLEURAL SPACE W/IMG GUIDE  07/20/2023   IR THORACENTESIS RIGHT ASP PLEURAL SPACE W/IMG GUIDE  12/02/2023   LEFT HEART CATH AND CORONARY ANGIOGRAPHY N/A 09/22/2019   Procedure: LEFT HEART CATH AND CORONARY ANGIOGRAPHY;  Surgeon: Jordan, Peter M, MD;  Location: [facility] INVASIVE CV LAB;  Service: Cardiovascular;  Laterality: N/A;   TONSILLECTOMY     TONSILLECTOMY     TUBAL LIGATION     Patient Active Problem List   Diagnosis Date Noted   ICD (implantable cardioverter-defibrillator) discharge 12/29/2023   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   Need for management of chest tube 12/06/2023   Hydropneumothorax 12/04/2023   Obesity, class 1 08/20/2023   Fall at home, initial encounter 08/09/2023   Generalized weakness 08/09/2023   Elevated troponin 08/09/2023   Anemia of chronic disease 08/09/2023   Acute on chronic diastolic heart failure (HCC) 08/08/2023   Cellulitis, leg 07/17/2023   Open wound of right lower extremity 07/17/2023   Respiratory distress 07/17/2023   Recurrent left pleural effusion 07/17/2023   Heart failure with reduced ejection fraction (HCC) 07/17/2023   Chronic kidney disease, stage  III (moderate) (HCC) 07/17/2023   Obesity (BMI 30-39.9) 07/17/2023   Urine culture positive 06/19/2022   Transaminitis 06/17/2022   Viral gastroenteritis 06/16/2022   Venous stasis dermatitis of both lower extremities 05/26/2022   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 05/22/2022   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/21/2022   Paroxysmal ventricular tachycardia (HCC) 09/21/2019   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   Unstable angina (HCC) 10/07/2015   Chronic systolic heart failure (HCC)    PVC's (premature ventricular contractions)    Ventricular fibrillation (HCC)  10/03/2015   History of cardiac arrest 10/03/2015   Essential hypertension 09/09/2015   Lymphedema 02/21/2015   Acquired hypothyroidism 02/21/2015    PCP: Claudene Pellet MD  REFERRING PROVIDER: Claudene Pellet MD  REFERRING DIAG: M54.50 LBP  Rationale for Evaluation and Treatment: Rehabilitation  THERAPY DIAG:  Other low back pain  Difficulty in walking, not elsewhere classified  Muscle weakness (generalized)  ONSET DATE: approx 1 year  SUBJECTIVE:                                                                                                                                                                                           SUBJECTIVE STATEMENT: My knee is good from the injection.  Had unaboots on my legs for 2 weeks just got them off. LBP today 3/10  PERTINENT HISTORY:  Leukemia oral chemo Pacemaker/debrillator CHF HTN Hospitalized in May for 1 week, fluid  2 falls in July: turning off light and tipped over on side;  the other fall was after got out of shower and turned and sat straight down (had to get son and caregiver helped get off the floor) caregiver 2x/week, alone 4 days/week HHPT 4-5 weeks walking with 4 WW, bands (red) for legs and arms  Blood clot in leg Lymphedema   PAIN:   08/22/2024  Are you having pain? Yes NPRS scale: 4-5/10 with sitting; night time really bad  Pain location: bil lumbar and left hip Pain orientation: Bilateral  PAIN TYPE: aching Pain description: intermittent  Aggravating factors: night, early AM, lying down  Relieving factors: sleep with heating pad; Tylenol , Blue Emu, Tiger Balm  PRECAUTIONS: ICD/Pacemaker    WEIGHT BEARING RESTRICTIONS: fall  FALLS:  Has patient fallen in last 6 months? Yes. Number of falls 2  LIVING ENVIRONMENT: Lives with: lives alone Lives in: House/apartment Stairs: No, 3 steps to get in the door (getting better) Has following equipment at home: Vannie - 4 wheeled, shower  chair  OCCUPATION: psychologist, counselling, real estate, currently volunteer for [employer]  PLOF: Independent with basic ADLs  PATIENT GOALS: want to have hydrotherapy;  go as far as I can in mobility; walk without help but I'm too off balance; be more flexible; get in car easier without hands assisting legs; energy to walk around grocery store  NEXT MD VISIT: as needed  OBJECTIVE:  Note: Objective measures were completed at Evaluation unless otherwise noted.  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  2. Personal care (washing, dressing, etc.) 0 =  I can take care of myself normally without causing increased pain.  3. Lifting 3 = Pain prevents me from lifting heavy weights, but I can manage (5) I have hardly any social life because of my pain. light to medium weights if they are conveniently positioned  4. Walking 3 =  Pain prevents me from walking more than  mile.  5. Sitting 1 =  I can only sit in my favorite chair as long as I like.  6. Standing 3 =  Pain prevents me from standing more than 1/2 hour.  7. Sleeping 1 = I can sleep well only by using pain medication.  8. Social Life 1 =  My social life is normal, but it increases my level of pain.  9. Traveling 1 =  I can travel anywhere, but it increases my pain.  10. Employment/ Homemaking 2 = I can perform most of my homemaking/job duties, but pain prevents me from performing more physically stressful activities (eg, lifting, vacuuming).  Total 34%   Interpretation of scores: Score Category Description  0-20% Minimal Disability The patient can cope with most living activities. Usually no treatment is indicated apart from advice on lifting, sitting and exercise  21-40% Moderate Disability The  patient experiences more pain and difficulty with sitting, lifting and standing. Travel and social life are more difficult and they may be disabled from work. Personal care, sexual activity and sleeping are not grossly affected, and the patient can usually be managed by conservative means  41-60% Severe Disability Pain remains the main problem in this group, but activities of daily living are affected. These patients require a detailed investigation  61-80% Crippled Back pain impinges on all aspects of the patients life. Positive intervention is required  81-100% Bed-bound  These patients are either bed-bound or exaggerating their symptoms  Bluford FORBES Zoe DELENA Karon DELENA, et al. Surgery versus conservative management of stable thoracolumbar fracture: the PRESTO feasibility RCT. [place] ([place]): [employer]; 2021 Nov. [facility] [employer] Assessment, No. 25.62.) Appendix 3, Oswestry Disability Index category descriptors. Available from: Findjewelers.cz  Minimally Clinically Important Difference (MCID) = 12.8%  Modified Oswestry: 07/03/24  22 / 50 = 44.0 %  COGNITION: Overall cognitive status: Within functional limits for tasks assessed     MUSCLE LENGTH: Decreased HS and hip flexor lengths bil POSTURE: tends to stand forward flexed slightly, reports decreased back pain with standing erect  LUMBAR ROM:   AROM eval  Flexion 50  Extension 10  Right lateral flexion   Left lateral flexion   Right rotation   Left rotation    (Blank rows = not tested)  TRUNK STRENGTH:  Decreased activation of transverse abdominus muscles; abdominals 4-/5; decreased activation of lumbar multifidi; trunk extensors 4-/5   LOWER EXTREMITY ROM:   lacks full knee extension bil;  hip flexion 70 degrees,  hip extension 5 degrees, unable to achieve seated figure 4 (external rotation)  LOWER EXTREMITY MMT:  unable to rise from a standard height chair without significant UE  assist; unable to SLS without heavy UE assist  MMT Right eval Left eval  Hip flexion 4- 4-  Hip extension 4- 4-  Hip abduction 3+ 3+  Hip adduction    Hip internal rotation    Hip external rotation    Knee flexion 4- 4-  Knee extension 3+ 3+  Ankle dorsiflexion 4- 4-  Ankle plantarflexion 4- 4-  Ankle inversion    Ankle eversion     (Blank rows = not tested)   FUNCTIONAL TESTS:  5x STS 15.43 sec shortness of breath, low back pain TUG with 4 WW hands assist 24.63 sec back pain < 5 2 MWT 222 feet RPE 5-6/10  O2 98%; strain feeling in back   07/03/24 TUG with 4WW hands assist  32.21 sec 5xSTS 19.17 sec no SOB reported, low back pain   GAIT: Comments: decreased gait speed, using 4 [employer]  TREATMENT DATE:  [facility] Adult PT Treatment:     08/22/24: Pt seen for aquatic therapy today.  Treatment took place in water  3.5-4.75 ft in depth at the [facility] pool. Temp of water  was 91.  Pt entered pool via forward facing steps using step to pattern and heavy uE support on hand rails/cga, exited lift due to fatigue.  -Seated in chair: LAQ; cycling; hip add/abd; ankle df/pf, circles -Standing ue support wall: high knee marching; relaxed squats -side stepping ue support wall x 10 R/L x 2. Cues for stepping over box 2nd set.  - seated rest period -forward amb across pool ue support white barbell: x 4 widths (short rest period at each wall) -STS from water  bench onto water  step with min/cga 2 x5.  Most assistance required with gaining immediate standing balance. Improves with repetition.  Pt requires the buoyancy and hydrostatic pressure of water  for support, and to offload joints by unweighting joint load by at least 50 % in navel deep water  and by at least 75-80% in chest to neck deep water .  Viscosity of the water  is needed for resistance of strengthening. Water  current perturbations provides challenge to standing balance requiring increased core activation.      07/28/24:t  seen for aquatic therapy today.  Treatment took place in water  3.5-4.75 ft in depth at the [facility] pool. Temp of water  was 91.  Pt entered and exited the pool via mechanical lift due to recent fall. -Seated in chair; LE movement in all planes 20x2 each Bi -Standing at wall next to chair: 2x10 heel lifts, 10x2 marches, 10 side steps, 10 backwards steps: pt holding onto wall -lumbar stretch holding onto 1 rail: 3 breaths 3x; repeat later in session -lateral step holding the wall: 3 steps each direction 3x.  -seated bicycle 1 min 3x   07/03/24 Discussion of current status, POC, recent fall, hip pain 5xsts TUG Mod oswestry Heel slides x 5 B Supine clam x 10 painful Hooklying hip ADD squeeze into ball x 10 some pain Supine piriformis stretch which foot over leg (top knee bent) x 30 sec painful Sit <>Supine transfers min A x 1     06/30/24:Pt seen for aquatic therapy today.  Treatment took place in water  3.5-4.75 ft in depth at the [facility] pool. Temp of water  was 91.  Pt entered and exited the pool via mechanical lift due to recent fall. -Seated  in chair; LE movement in all planes 20x ach Bi -Standing at wall next to chair: 5 heel lifts, 10 marches, 10 side steps, 5 backwards steps: pt holding onto wall -lumbar stretch holding onto 1 rail: 3 breaths 3x; repeat later in session -lateral step holding the wall: 3 steps each direction 3x -holding xtra large noodle 75% depth forward walk 2 lengths back to mechanical chair.  -seated bicycle 45 sec 2x                                                                                                           PATIENT EDUCATION:  Education details: Educated patient on anatomy and physiology of current symptoms, prognosis, plan of care as well as initial self care strategies to promote recovery Person educated: Patient Education method: Explanation Education comprehension: verbalized understanding  HOME EXERCISE  PROGRAM: Access Code: [email or web address] URL: [email or web address] Date: 07/03/2024 Prepared by: Mliss  Exercises - Supine Heel Slide  - 1 x daily - 7 x weekly - 2 sets - 10 reps - Beginner Bridge  - 1 x daily - 7 x weekly - 2 sets - 10 reps - Seated Hip Abduction  - 1 x daily - 7 x weekly - 2 sets - 10 reps - Sit to Stand with Counter Support  - 2 x daily - 7 x weekly - 2 sets - 5 repsTo be started  ASSESSMENT:  CLINICAL IMPRESSION:  Significant reduction in bilat ankle/foot edema due to Unaboot application x 2 weeks.She is able to move bilat ankles in full range all directions. Pt able to tolerate stair negotiation into pool then requires lift to exit.  She progressed well today to returned amb across pool staying as shallow as able and pausing on each side for short rest.  She does have some unsteadiness which she self corrects without difficulty improving as skill is repeated. She reports increased engagement of gastroc and ant tib with amb creating some fatigue likely due to the full range she is able to move through in ankles today with the reduction in ankle edema. Good session. Pt is please with her progress.   OBJECTIVE IMPAIRMENTS: decreased activity tolerance, decreased balance, decreased mobility, difficulty walking, decreased ROM, decreased strength, impaired perceived functional ability, impaired flexibility, and pain.   ACTIVITY LIMITATIONS: carrying, lifting, bending, standing, squatting, sleeping, stairs, transfers, hygiene/grooming, and locomotion level  PARTICIPATION LIMITATIONS: meal prep, cleaning, laundry, driving, shopping, and community activity  PERSONAL FACTORS: Time since onset of injury/illness/exacerbation and 3+ comorbidities: cardiac history, leukemia, lymphedema, HTN are also affecting patient's functional outcome.   REHAB POTENTIAL: Good  CLINICAL DECISION MAKING: Evolving/moderate complexity  EVALUATION COMPLEXITY: Moderate   GOALS: Goals  reviewed with patient? Yes  SHORT TERM GOALS: Target date: 06/08/2024    The patient will demonstrate knowledge of basic self care strategies and exercises to promote healing  Baseline: Goal status: MET  2.  The patient will report a 30% improvement in pain levels with functional activities which are currently difficult including night time and in the mornings Baseline:  Goal  status: IN PROGRESS 11/24  3.  The patient will have improved Timed Up and Go (TUG) time to    20 sec   sec indicating improved gait speed and LE strength  Baseline:  Goal status: IN PROGRESS 11/24  4.  The patient will be able to ambulate 350 feet in 3 minutes needed for short community walking, medical appts Baseline:  Goal status: IN PROGRESS 11/24 183 FT in 3 min  5.  Improved LE strength improved as indicated by 5x STS test to 14 sec with min UE assist Baseline:  Goal status: IN PROGRESS 11/24    LONG TERM GOALS: Target date: 09/29/24    The patient will be independent in a safe self progression of a home exercise program to promote further recovery of function  Baseline:  Goal status: INITIAL  2.  The patient will report a 30% improvement in pain levels with functional activities which are currently difficult including night time and in the mornings Baseline:  Goal status: INITIAL  3.  The patient will have improved gait stamina and speed needed to ambulate 600 feet in 6 minutes  Baseline:  Goal status: INITIAL  4.  The patient will have improved Timed Up and Go (TUG) time to   17   sec indicating improved gait speed, decreased risk of falls and LE strength  Baseline:  Goal status: INITIAL  5.  Modified Oswestry functional outcome measure score improved to   26  % indicating improved function with ADLS with less pain.   Baseline:  Goal status: INITIAL   PLAN:  PT FREQUENCY: 2x/week  PT DURATION: 12 weeks  PLANNED INTERVENTIONS: 97164- PT Re-evaluation, 97750- Physical Performance  Testing, 97110-Therapeutic exercises, 97530- Therapeutic activity, 97112- Neuromuscular re-education, 97535- Self Care, 02859- Manual therapy, [phone]- Aquatic Therapy, [phone] (1-2 muscles), 20561 (3+ muscles)- Dry Needling, Patient/Family education, Balance training, Stair training, Taping, Joint mobilization, Cryotherapy, and Moist heat.  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 [place], [place], [address] Phone: [phone]   Fax:  [phone]    "

## 2024-08-22 NOTE — Discharge Instructions (Addendum)
 Today you were seen for a fall.  Your workup while in the emergency department is reassuring.  You may ice the back of your head and take Tylenol  as needed for pain.  Please return to the ED if you have uncontrollable vomiting, numbness, weakness, or double vision.  Thank you for letting us  treat you today. After reviewing your labs and imaging, I feel you are safe to go home. Please follow up with your PCP in the next several days and provide them with your records from this visit. Return to the Emergency Room if pain becomes severe or symptoms worsen.

## 2024-08-22 NOTE — ED Triage Notes (Signed)
 Pt via [facility] from home after unwitnessed fall on thinners. Pt lives alone. She was seated on her rollator and the walker rolled out from underneath her. Pt says she fell and hit her head on the walker; small dime-sized hematoma noted to posterior head. Pt takes pradaxa . No LOC, PERRLA. Pt denies pain to head, neck, back but notes some soreness at nape of neck after transport.   BP 168/86 HR 78 O2 100% RA CBG 116, no DM2 hx RR 18 A/O x 4

## 2024-08-22 NOTE — ED Notes (Signed)
 Pt noting tightening of neck and shoulders. Warm blanket applied to neck and shoulders. Messaged dr. Cottie for pain medication

## 2024-08-23 ENCOUNTER — Telehealth: Payer: Self-pay | Admitting: Radiology

## 2024-08-23 ENCOUNTER — Other Ambulatory Visit: Payer: Self-pay | Admitting: Radiology

## 2024-08-23 ENCOUNTER — Telehealth: Payer: Self-pay | Admitting: Orthopaedic Surgery

## 2024-08-23 ENCOUNTER — Ambulatory Visit: Admitting: Orthopaedic Surgery

## 2024-08-23 DIAGNOSIS — R29898 Other symptoms and signs involving the musculoskeletal system: Secondary | ICD-10-CM

## 2024-08-23 NOTE — Telephone Encounter (Signed)
 Placed MRI lumbar order for patient at [facility] imaging. Patient stated that she has a defibrillator and needs to have this done at the hospital instead per previous scheduling conflict with [place] imaging.   Can you please change this?   Thank you!

## 2024-08-23 NOTE — Telephone Encounter (Signed)
 Pt called asking for a call back. Pt states she had a CT Scan at hospital and need to know if she need another one for budging disc. Pt was not clear what she needed. Please call pt at [phone].

## 2024-08-23 NOTE — Telephone Encounter (Signed)
 Placed orders. Called patient.

## 2024-08-24 ENCOUNTER — Telehealth: Payer: Self-pay | Admitting: Orthopaedic Surgery

## 2024-08-24 NOTE — Telephone Encounter (Signed)
 Pt called saying she needs to get an MRI and it needs to be scheduled at [facility] because she has a defibrillator. Call back number is [phone].

## 2024-08-24 NOTE — Telephone Encounter (Signed)
 Message was sent to [facility] for clarification on recent message

## 2024-08-25 ENCOUNTER — Ambulatory Visit: Admitting: Physical Therapy

## 2024-08-25 ENCOUNTER — Ambulatory Visit: Attending: Family Medicine | Admitting: Physical Therapy

## 2024-08-25 DIAGNOSIS — R262 Difficulty in walking, not elsewhere classified: Secondary | ICD-10-CM | POA: Diagnosis present

## 2024-08-25 DIAGNOSIS — M5459 Other low back pain: Secondary | ICD-10-CM | POA: Diagnosis present

## 2024-08-25 DIAGNOSIS — M6281 Muscle weakness (generalized): Secondary | ICD-10-CM | POA: Insufficient documentation

## 2024-08-25 NOTE — Therapy (Signed)
 " OUTPATIENT PHYSICAL THERAPY THORACOLUMBAR TREATMENT NOTE     Patient Name: Lisa [name] MRN: [id number] DOB:1940-10-30, 84 y.o., female Today's Date: 08/25/2024  END OF SESSION:  PT End of Session - 08/25/24 1223     Visit Number 11    Date for Recertification  09/29/24    Authorization Type Medicare    Progress Note Due on Visit 18    PT Start Time 1215    PT Stop Time 1302    PT Time Calculation (min) 47 min    Activity Tolerance Patient tolerated treatment well                   Past Medical History:  Diagnosis Date   Acute on chronic systolic heart failure (HCC)    Acute renal failure superimposed on stage 3b chronic kidney disease (HCC) 05/22/2022   Bacteremia due to Pseudomonas 06/16/2022   CAD in native artery 2017   stent to LAD   Cardiac arrest (HCC) 09/2015   CHF (congestive heart failure) (HCC)    CML (chronic myelocytic leukemia) (HCC)    Complication of anesthesia    Hx: UTI (urinary tract infection)    Hypertension    Melanoma (HCC)    PONV (postoperative nausea and vomiting)    PVC (premature ventricular contraction)    HISTORY OF   Thyroid  disease    V tach (HCC)    Past Surgical History:  Procedure Laterality Date   ABDOMINAL HYSTERECTOMY     APPENDECTOMY     CARDIAC CATHETERIZATION N/A 10/03/2015   Procedure: Left Heart Cath and Coronary Angiography;  Surgeon: Peter M Jordan, MD;  Location: [facility] INVASIVE CV LAB;  Service: Cardiovascular;  Laterality: N/A;   CARDIAC CATHETERIZATION N/A 10/07/2015   Procedure: Coronary Stent Intervention;  Surgeon: Lonni JONETTA Cash, MD;  Location: [facility] INVASIVE CV LAB;  Service: Cardiovascular;  Laterality: N/A;   CARDIAC CATHETERIZATION  09/22/2019   EP IMPLANTABLE DEVICE N/A 01/20/2016   Procedure: BiV ICD Insertion CRT-D;  Surgeon: Danelle LELON Birmingham, MD;  Location: [facility] INVASIVE CV LAB;  Service: Cardiovascular;  Laterality: N/A;   ICD GENERATOR CHANGEOUT N/A 01/07/2024   Procedure: ICD GENERATOR  CHANGEOUT;  Surgeon: Birmingham Danelle LELON, MD;  Location: [facility] INVASIVE CV LAB;  Service: Cardiovascular;  Laterality: N/A;   IR THORACENTESIS RIGHT ASP PLEURAL SPACE W/IMG GUIDE  07/20/2023   IR THORACENTESIS RIGHT ASP PLEURAL SPACE W/IMG GUIDE  12/02/2023   LEFT HEART CATH AND CORONARY ANGIOGRAPHY N/A 09/22/2019   Procedure: LEFT HEART CATH AND CORONARY ANGIOGRAPHY;  Surgeon: Jordan, Peter M, MD;  Location: [facility] INVASIVE CV LAB;  Service: Cardiovascular;  Laterality: N/A;   TONSILLECTOMY     TONSILLECTOMY     TUBAL LIGATION     Patient Active Problem List   Diagnosis Date Noted   ICD (implantable cardioverter-defibrillator) discharge 12/29/2023   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   Need for management of chest tube 12/06/2023   Hydropneumothorax 12/04/2023   Obesity, class 1 08/20/2023   Fall at home, initial encounter 08/09/2023   Generalized weakness 08/09/2023   Elevated troponin 08/09/2023   Anemia of chronic disease 08/09/2023   Acute on chronic diastolic heart failure (HCC) 08/08/2023   Cellulitis, leg 07/17/2023   Open wound of right lower extremity 07/17/2023   Respiratory distress 07/17/2023   Recurrent left pleural effusion 07/17/2023   Heart failure with reduced ejection fraction (HCC) 07/17/2023   Chronic kidney disease, stage III (moderate) (HCC) 07/17/2023   Obesity (BMI 30-39.9) 07/17/2023  Urine culture positive 06/19/2022   Transaminitis 06/17/2022   Viral gastroenteritis 06/16/2022   Venous stasis dermatitis of both lower extremities 05/26/2022   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 05/22/2022   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/21/2022   Paroxysmal ventricular tachycardia (HCC) 09/21/2019   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   Unstable angina (HCC) 10/07/2015   Chronic systolic heart failure (HCC)    PVC's (premature ventricular contractions)    Ventricular fibrillation (HCC) 10/03/2015   History of cardiac arrest 10/03/2015   Essential  hypertension 09/09/2015   Lymphedema 02/21/2015   Acquired hypothyroidism 02/21/2015    PCP: Claudene Pellet MD  REFERRING PROVIDER: Claudene Pellet MD  REFERRING DIAG: M54.50 LBP  Rationale for Evaluation and Treatment: Rehabilitation  THERAPY DIAG:  Other low back pain  Difficulty in walking, not elsewhere classified  Muscle weakness (generalized)  ONSET DATE: approx 1 year  SUBJECTIVE:                                                                                                                                                                                           SUBJECTIVE STATEMENT: CT scan says I have 2 bulging discs in my back. Most of my pain is in the early morning. I was sitting on my rollator the other day and fell slid out onto the floor.  PERTINENT HISTORY:  Leukemia oral chemo Pacemaker/debrillator CHF HTN Hospitalized in May for 1 week, fluid  2 falls in July: turning off light and tipped over on side;  the other fall was after got out of shower and turned and sat straight down (had to get son and caregiver helped get off the floor) caregiver 2x/week, alone 4 days/week HHPT 4-5 weeks walking with 4 WW, bands (red) for legs and arms  Blood clot in leg Lymphedema   PAIN:   08/25/2024  Are you having pain? Yes NPRS scale: 4-5/10 with sitting; night time really bad  Pain location: bil lumbar and left hip Pain orientation: Bilateral  PAIN TYPE: aching Pain description: intermittent  Aggravating factors: night, early AM, lying down  Relieving factors: sleep with heating pad; Tylenol , Blue Emu, Tiger Balm  PRECAUTIONS: ICD/Pacemaker    WEIGHT BEARING RESTRICTIONS: fall  FALLS:  Has patient fallen in last 6 months? Yes. Number of falls 2  LIVING ENVIRONMENT: Lives with: lives alone Lives in: House/apartment Stairs: No, 3 steps to get in the door (getting better) Has following equipment at home: Vannie - 4 wheeled, shower chair  OCCUPATION:  psychologist, counselling, real estate, currently volunteer for [employer]  PLOF: Independent with basic ADLs  PATIENT GOALS: want to have  hydrotherapy; go as far as I can in mobility; walk without help but I'm too off balance; be more flexible; get in car easier without hands assisting legs; energy to walk around grocery store  NEXT MD VISIT: as needed  OBJECTIVE:  Note: Objective measures were completed at Evaluation unless otherwise noted.  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  2. Personal care (washing, dressing, etc.) 0 =  I can take care of myself normally without causing increased pain.  3. Lifting 3 = Pain prevents me from lifting heavy weights, but I can manage (5) I have hardly any social life because of my pain. light to medium weights if they are conveniently positioned  4. Walking 3 =  Pain prevents me from walking more than  mile.  5. Sitting 1 =  I can only sit in my favorite chair as long as I like.  6. Standing 3 =  Pain prevents me from standing more than 1/2 hour.  7. Sleeping 1 = I can sleep well only by using pain medication.  8. Social Life 1 =  My social life is normal, but it increases my level of pain.  9. Traveling 1 =  I can travel anywhere, but it increases my pain.  10. Employment/ Homemaking 2 = I can perform most of my homemaking/job duties, but pain prevents me from performing more physically stressful activities (eg, lifting, vacuuming).  Total 34%   Interpretation of scores: Score Category Description  0-20% Minimal Disability The patient can cope with most living activities. Usually no treatment is indicated apart from advice on lifting, sitting and exercise  21-40% Moderate Disability The patient experiences  more pain and difficulty with sitting, lifting and standing. Travel and social life are more difficult and they may be disabled from work. Personal care, sexual activity and sleeping are not grossly affected, and the patient can usually be managed by conservative means  41-60% Severe Disability Pain remains the main problem in this group, but activities of daily living are affected. These patients require a detailed investigation  61-80% Crippled Back pain impinges on all aspects of the patients life. Positive intervention is required  81-100% Bed-bound  These patients are either bed-bound or exaggerating their symptoms  Bluford FORBES Zoe DELENA Karon DELENA, et al. Surgery versus conservative management of stable thoracolumbar fracture: the PRESTO feasibility RCT. [place] ([place]): [employer]; 2021 Nov. [facility] Technology Assessment, No. 25.62.) Appendix 3, Oswestry Disability Index category descriptors. Available from: Findjewelers.cz  Minimally Clinically Important Difference (MCID) = 12.8%  Modified Oswestry: 07/03/24  22 / 50 = 44.0 %  COGNITION: Overall cognitive status: Within functional limits for tasks assessed     MUSCLE LENGTH: Decreased HS and hip flexor lengths bil POSTURE: tends to stand forward flexed slightly, reports decreased back pain with standing erect  LUMBAR ROM:   AROM eval  Flexion 50  Extension 10  Right lateral flexion   Left lateral flexion   Right rotation   Left rotation    (Blank rows = not tested)  TRUNK STRENGTH:  Decreased activation of transverse abdominus muscles; abdominals 4-/5; decreased activation of lumbar multifidi; trunk extensors 4-/5   LOWER EXTREMITY ROM:   lacks full knee extension bil;  hip flexion 70  degrees, hip extension 5 degrees, unable to achieve seated figure 4 (external rotation)  LOWER EXTREMITY MMT:  unable to rise from a standard height chair without significant UE assist; unable to SLS  without heavy UE assist  MMT Right eval Left eval  Hip flexion 4- 4-  Hip extension 4- 4-  Hip abduction 3+ 3+  Hip adduction    Hip internal rotation    Hip external rotation    Knee flexion 4- 4-  Knee extension 3+ 3+  Ankle dorsiflexion 4- 4-  Ankle plantarflexion 4- 4-  Ankle inversion    Ankle eversion     (Blank rows = not tested)   FUNCTIONAL TESTS:  5x STS 15.43 sec shortness of breath, low back pain TUG with 4 WW hands assist 24.63 sec back pain < 5 2 MWT 222 feet RPE 5-6/10  O2 98%; strain feeling in back   07/03/24 TUG with 4WW hands assist  32.21 sec 5xSTS 19.17 sec no SOB reported, low back pain   GAIT: Comments: decreased gait speed, using 4 [employer]  TREATMENT DATE:  [facility] Adult PT Treatment:   08/25/24:Pt seen for aquatic therapy today.  Treatment took place in water  3.5-4.75 ft in depth at the [facility] pool. Temp of water  was 91.  Pt entered and exited the pool via mechanical lift today.Pt requires buoyancy of water  for support and to offload joints with strengthening exercises.    -Seated in chair: LAQ; cycling (1 min 2x); hip add/abd; ankle df/pf, circles 20x each Bil -Standing ue support wall: high knee marching; relaxed squats, calf raises, side steps, back steps 2x10 Bil  - seated rest period -forward amb across pool ue support yellow noodle 4 lengths. -Attempted sitting in chair and pressing the small green ball under the water  for seated core strength. Pt would tip over, attempted small ball submersion and this was better but pt had to put 1 foot on the floor.  08/22/24: Pt seen for aquatic therapy today.  Treatment took place in water  3.5-4.75 ft in depth at the [facility] pool. Temp of water  was 91.  Pt entered pool via forward facing steps using step to pattern and heavy uE support on hand rails/cga, exited lift due to fatigue.  -Seated in chair: LAQ; cycling; hip add/abd; ankle df/pf, circles -Standing ue support wall: high  knee marching; relaxed squats -side stepping ue support wall x 10 R/L x 2. Cues for stepping over box 2nd set.  - seated rest period -forward amb across pool ue support white barbell: x 4 widths (short rest period at each wall) -STS from water  bench onto water  step with min/cga 2 x5.  Most assistance required with gaining immediate standing balance. Improves with repetition.  Pt requires the buoyancy and hydrostatic pressure of water  for support, and to offload joints by unweighting joint load by at least 50 % in navel deep water  and by at least 75-80% in chest to neck deep water .  Viscosity of the water  is needed for resistance of strengthening. Water  current perturbations provides challenge to standing balance requiring increased core activation.      07/28/24:t seen for aquatic therapy today.  Treatment took place in water  3.5-4.75 ft in depth at the [facility] pool. Temp of water  was 91.  Pt entered and exited the pool via mechanical lift due to recent fall. -Seated in chair; LE movement in all planes 20x2 each Bi -Standing at wall next to chair: 2x10 heel lifts, 10x2 marches, 10 side  steps, 10 backwards steps: pt holding onto wall -lumbar stretch holding onto 1 rail: 3 breaths 3x; repeat later in session -lateral step holding the wall: 3 steps each direction 3x.  -seated bicycle 1 min 3x                                                                                                          PATIENT EDUCATION:  Education details: Educated patient on anatomy and physiology of current symptoms, prognosis, plan of care as well as initial self care strategies to promote recovery Person educated: Patient Education method: Explanation Education comprehension: verbalized understanding  HOME EXERCISE PROGRAM: Access Code: [email or web address] URL: [email or web address] Date: 07/03/2024 Prepared by: Mliss  Exercises - Supine Heel Slide  - 1 x daily - 7 x weekly - 2 sets - 10  reps - Beginner Bridge  - 1 x daily - 7 x weekly - 2 sets - 10 reps - Seated Hip Abduction  - 1 x daily - 7 x weekly - 2 sets - 10 reps - Sit to Stand with Counter Support  - 2 x daily - 7 x weekly - 2 sets - 5 repsTo be started  ASSESSMENT:  CLINICAL IMPRESSION: Pt fell out of her rollator this week while sitting on it. She is currently awaiting an MRI. She reports CT scan showed bulging discs in her back. Extra safety today getting in and out of the pool (using mechanical lift) due to recent fall. Pt's mobility was slow but demonstrated good safety. Pt could not sit and press the green ball to her waist without tipping over.   OBJECTIVE IMPAIRMENTS: decreased activity tolerance, decreased balance, decreased mobility, difficulty walking, decreased ROM, decreased strength, impaired perceived functional ability, impaired flexibility, and pain.   ACTIVITY LIMITATIONS: carrying, lifting, bending, standing, squatting, sleeping, stairs, transfers, hygiene/grooming, and locomotion level  PARTICIPATION LIMITATIONS: meal prep, cleaning, laundry, driving, shopping, and community activity  PERSONAL FACTORS: Time since onset of injury/illness/exacerbation and 3+ comorbidities: cardiac history, leukemia, lymphedema, HTN are also affecting patient's functional outcome.   REHAB POTENTIAL: Good  CLINICAL DECISION MAKING: Evolving/moderate complexity  EVALUATION COMPLEXITY: Moderate   GOALS: Goals reviewed with patient? Yes  SHORT TERM GOALS: Target date: 06/08/2024    The patient will demonstrate knowledge of basic self care strategies and exercises to promote healing  Baseline: Goal status: MET  2.  The patient will report a 30% improvement in pain levels with functional activities which are currently difficult including night time and in the mornings Baseline:  Goal status: IN PROGRESS 11/24  3.  The patient will have improved Timed Up and Go (TUG) time to    20 sec   sec indicating  improved gait speed and LE strength  Baseline:  Goal status: IN PROGRESS 11/24  4.  The patient will be able to ambulate 350 feet in 3 minutes needed for short community walking, medical appts Baseline:  Goal status: IN PROGRESS 11/24 183 FT in 3 min  5.  Improved LE strength improved as indicated by 5x STS test to 14  sec with min UE assist Baseline:  Goal status: IN PROGRESS 11/24    LONG TERM GOALS: Target date: 09/29/24    The patient will be independent in a safe self progression of a home exercise program to promote further recovery of function  Baseline:  Goal status: INITIAL  2.  The patient will report a 30% improvement in pain levels with functional activities which are currently difficult including night time and in the mornings Baseline:  Goal status: INITIAL  3.  The patient will have improved gait stamina and speed needed to ambulate 600 feet in 6 minutes  Baseline:  Goal status: INITIAL  4.  The patient will have improved Timed Up and Go (TUG) time to   17   sec indicating improved gait speed, decreased risk of falls and LE strength  Baseline:  Goal status: INITIAL  5.  Modified Oswestry functional outcome measure score improved to   26  % indicating improved function with ADLS with less pain.   Baseline:  Goal status: INITIAL   PLAN:  PT FREQUENCY: 2x/week  PT DURATION: 12 weeks  PLANNED INTERVENTIONS: 97164- PT Re-evaluation, 97750- Physical Performance Testing, 97110-Therapeutic exercises, 97530- Therapeutic activity, 97112- Neuromuscular re-education, 97535- Self Care, 02859- Manual therapy, [phone]- Aquatic Therapy, [phone] (1-2 muscles), [id number] (3+ muscles)- Dry Needling, Patient/Family education, Balance training, Stair training, Taping, Joint mobilization, Cryotherapy, and Moist heat.  PLAN FOR NEXT SESSION: Sees ortho MD Dec 17th, continue strengthening hip flexors especially for step ups, and general hip strengthening No ES  (pacemaker/defibrillator)  Delon Darner, PTA 08/25/24 1:59 PM   [facility] [address] [place], [place], [address] Phone: [phone]   Fax:  [phone]    "

## 2024-08-29 ENCOUNTER — Ambulatory Visit

## 2024-08-29 ENCOUNTER — Encounter (HOSPITAL_BASED_OUTPATIENT_CLINIC_OR_DEPARTMENT_OTHER): Payer: Self-pay | Admitting: Physical Therapy

## 2024-08-29 ENCOUNTER — Ambulatory Visit (HOSPITAL_BASED_OUTPATIENT_CLINIC_OR_DEPARTMENT_OTHER): Attending: Family Medicine | Admitting: Physical Therapy

## 2024-08-29 DIAGNOSIS — M545 Low back pain, unspecified: Secondary | ICD-10-CM | POA: Diagnosis present

## 2024-08-29 DIAGNOSIS — M5459 Other low back pain: Secondary | ICD-10-CM

## 2024-08-29 DIAGNOSIS — M6281 Muscle weakness (generalized): Secondary | ICD-10-CM | POA: Insufficient documentation

## 2024-08-29 DIAGNOSIS — R262 Difficulty in walking, not elsewhere classified: Secondary | ICD-10-CM | POA: Diagnosis not present

## 2024-08-29 DIAGNOSIS — I255 Ischemic cardiomyopathy: Secondary | ICD-10-CM

## 2024-08-29 NOTE — Therapy (Signed)
 " OUTPATIENT PHYSICAL THERAPY THORACOLUMBAR TREATMENT NOTE     Patient Name: Lisa [name] MRN: [id number] DOB:1940/12/28, 84 y.o., female Today's Date: 08/29/2024  END OF SESSION:  PT End of Session - 08/29/24 1546     Visit Number 12    Date for Recertification  09/29/24    Authorization Type Medicare    Progress Note Due on Visit 18    PT Start Time 1531    PT Stop Time 1615    PT Time Calculation (min) 44 min    Activity Tolerance Patient tolerated treatment well    Behavior During Therapy [name] for tasks assessed/performed                    Past Medical History:  Diagnosis Date   Acute on chronic systolic heart failure (HCC)    Acute renal failure superimposed on stage 3b chronic kidney disease (HCC) 05/22/2022   Bacteremia due to Pseudomonas 06/16/2022   CAD in native artery 2017   stent to LAD   Cardiac arrest (HCC) 09/2015   CHF (congestive heart failure) (HCC)    CML (chronic myelocytic leukemia) (HCC)    Complication of anesthesia    Hx: UTI (urinary tract infection)    Hypertension    Melanoma (HCC)    PONV (postoperative nausea and vomiting)    PVC (premature ventricular contraction)    HISTORY OF   Thyroid  disease    V tach (HCC)    Past Surgical History:  Procedure Laterality Date   ABDOMINAL HYSTERECTOMY     APPENDECTOMY     CARDIAC CATHETERIZATION N/A 10/03/2015   Procedure: Left Heart Cath and Coronary Angiography;  Surgeon: Lisa M Jordan, MD;  Location: [facility] INVASIVE CV LAB;  Service: Cardiovascular;  Laterality: N/A;   CARDIAC CATHETERIZATION N/A 10/07/2015   Procedure: Coronary Stent Intervention;  Surgeon: Lisa JONETTA Cash, MD;  Location: [facility] INVASIVE CV LAB;  Service: Cardiovascular;  Laterality: N/A;   CARDIAC CATHETERIZATION  09/22/2019   EP IMPLANTABLE DEVICE N/A 01/20/2016   Procedure: BiV ICD Insertion CRT-D;  Surgeon: Lisa LELON Birmingham, MD;  Location: [facility] INVASIVE CV LAB;  Service: Cardiovascular;  Laterality: N/A;   ICD  GENERATOR CHANGEOUT N/A 01/07/2024   Procedure: ICD GENERATOR CHANGEOUT;  Surgeon: [name] Lisa LELON, MD;  Location: [facility] INVASIVE CV LAB;  Service: Cardiovascular;  Laterality: N/A;   IR THORACENTESIS RIGHT ASP PLEURAL SPACE W/IMG GUIDE  07/20/2023   IR THORACENTESIS RIGHT ASP PLEURAL SPACE W/IMG GUIDE  12/02/2023   LEFT HEART CATH AND CORONARY ANGIOGRAPHY N/A 09/22/2019   Procedure: LEFT HEART CATH AND CORONARY ANGIOGRAPHY;  Surgeon: [name], Lisa M, MD;  Location: [facility] INVASIVE CV LAB;  Service: Cardiovascular;  Laterality: N/A;   TONSILLECTOMY     TONSILLECTOMY     TUBAL LIGATION     Patient Active Problem List   Diagnosis Date Noted   ICD (implantable cardioverter-defibrillator) discharge 12/29/2023   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   Need for management of chest tube 12/06/2023   Hydropneumothorax 12/04/2023   Obesity, class 1 08/20/2023   Fall at home, initial encounter 08/09/2023   Generalized weakness 08/09/2023   Elevated troponin 08/09/2023   Anemia of chronic disease 08/09/2023   Acute on chronic diastolic heart failure (HCC) 08/08/2023   Cellulitis, leg 07/17/2023   Open wound of right lower extremity 07/17/2023   Respiratory distress 07/17/2023   Recurrent left pleural effusion 07/17/2023   Heart failure with reduced ejection fraction (HCC) 07/17/2023   Chronic kidney disease,  stage III (moderate) (HCC) 07/17/2023   Obesity (BMI 30-39.9) 07/17/2023   Urine culture positive 06/19/2022   Transaminitis 06/17/2022   Viral gastroenteritis 06/16/2022   Venous stasis dermatitis of both lower extremities 05/26/2022   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 05/22/2022   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/21/2022   Paroxysmal ventricular tachycardia (HCC) 09/21/2019   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   Unstable angina (HCC) 10/07/2015   Chronic systolic heart failure (HCC)    PVC's (premature ventricular contractions)    Ventricular fibrillation (HCC)  10/03/2015   History of cardiac arrest 10/03/2015   Essential hypertension 09/09/2015   Lymphedema 02/21/2015   Acquired hypothyroidism 02/21/2015    PCP: Lisa Pellet MD  REFERRING PROVIDER: Claudene Pellet MD  REFERRING DIAG: M54.50 LBP  Rationale for Evaluation and Treatment: Rehabilitation  THERAPY DIAG:  Other low back pain  Difficulty in walking, not elsewhere classified  Muscle weakness (generalized)  ONSET DATE: approx 1 year  SUBJECTIVE:                                                                                                                                                                                           SUBJECTIVE STATEMENT: Have MRI to see more on what's going on but not scheduled yet.  Left sided LBP increased some today since I got up this morning when I am walking, not bad just there 3/10 otherwise 0/10  PERTINENT HISTORY:  Leukemia oral chemo Pacemaker/debrillator CHF HTN Hospitalized in May for 1 week, fluid  2 falls in July: turning off light and tipped over on side;  the other fall was after got out of shower and turned and sat straight down (had to get son and caregiver helped get off the floor) caregiver 2x/week, alone 4 days/week HHPT 4-5 weeks walking with 4 WW, bands (red) for legs and arms  Blood clot in leg Lymphedema   PAIN:   08/29/2024  Are you having pain? Yes NPRS scale: 4-5/10 with sitting; night time really bad  Pain location: bil lumbar and left hip Pain orientation: Bilateral  PAIN TYPE: aching Pain description: intermittent  Aggravating factors: night, early AM, lying down  Relieving factors: sleep with heating pad; Tylenol , Blue Emu, Tiger Balm  PRECAUTIONS: ICD/Pacemaker    WEIGHT BEARING RESTRICTIONS: fall  FALLS:  Has patient fallen in last 6 months? Yes. Number of falls 2  LIVING ENVIRONMENT: Lives with: lives alone Lives in: House/apartment Stairs: No, 3 steps to get in the door (getting  better) Has following equipment at home: Vannie - 4 wheeled, shower chair  OCCUPATION: psychologist, counselling, real estate, currently volunteer for  [employer]  PLOF: Independent with basic ADLs  PATIENT GOALS: want to have hydrotherapy; go as far as I can in mobility; walk without help but I'm too off balance; be more flexible; get in car easier without hands assisting legs; energy to walk around grocery store  NEXT MD VISIT: as needed  OBJECTIVE:  Note: Objective measures were completed at Evaluation unless otherwise noted.  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  2. Personal care (washing, dressing, etc.) 0 =  I can take care of myself normally without causing increased pain.  3. Lifting 3 = Pain prevents me from lifting heavy weights, but I can manage (5) I have hardly any social life because of my pain. light to medium weights if they are conveniently positioned  4. Walking 3 =  Pain prevents me from walking more than  mile.  5. Sitting 1 =  I can only sit in my favorite chair as long as I like.  6. Standing 3 =  Pain prevents me from standing more than 1/2 hour.  7. Sleeping 1 = I can sleep well only by using pain medication.  8. Social Life 1 =  My social life is normal, but it increases my level of pain.  9. Traveling 1 =  I can travel anywhere, but it increases my pain.  10. Employment/ Homemaking 2 = I can perform most of my homemaking/job duties, but pain prevents me from performing more physically stressful activities (eg, lifting, vacuuming).  Total 34%   Interpretation of scores: Score Category Description  0-20% Minimal Disability The patient can cope with most living activities. Usually no treatment is indicated apart from advice  on lifting, sitting and exercise  21-40% Moderate Disability The patient experiences more pain and difficulty with sitting, lifting and standing. Travel and social life are more difficult and they may be disabled from work. Personal care, sexual activity and sleeping are not grossly affected, and the patient can usually be managed by conservative means  41-60% Severe Disability Pain remains the main problem in this group, but activities of daily living are affected. These patients require a detailed investigation  61-80% Crippled Back pain impinges on all aspects of the patients life. Positive intervention is required  81-100% Bed-bound  These patients are either bed-bound or exaggerating their symptoms  Bluford FORBES Zoe DELENA Karon DELENA, et al. Surgery versus conservative management of stable thoracolumbar fracture: the PRESTO feasibility RCT. [place] ([place]): [employer]; 2021 Nov. [facility] [employer] Assessment, No. 25.62.) Appendix 3, Oswestry Disability Index category descriptors. Available from: Findjewelers.cz  Minimally Clinically Important Difference (MCID) = 12.8%  Modified Oswestry: 07/03/24  22 / 50 = 44.0 %  COGNITION: Overall cognitive status: Within functional limits for tasks assessed     MUSCLE LENGTH: Decreased HS and hip flexor lengths bil POSTURE: tends to stand forward flexed slightly, reports decreased back pain with standing erect  LUMBAR ROM:   AROM eval  Flexion 50  Extension 10  Right lateral flexion   Left lateral flexion   Right rotation   Left rotation    (Blank rows = not tested)  TRUNK STRENGTH:  Decreased activation of transverse abdominus muscles; abdominals 4-/5; decreased activation of lumbar multifidi; trunk extensors 4-/5  LOWER EXTREMITY ROM:   lacks full knee extension bil;  hip flexion 70 degrees, hip extension 5 degrees, unable to achieve seated figure 4 (external rotation)  LOWER EXTREMITY MMT:  unable  to rise from a standard height chair without significant UE assist; unable to SLS without heavy UE assist  MMT Right eval Left eval  Hip flexion 4- 4-  Hip extension 4- 4-  Hip abduction 3+ 3+  Hip adduction    Hip internal rotation    Hip external rotation    Knee flexion 4- 4-  Knee extension 3+ 3+  Ankle dorsiflexion 4- 4-  Ankle plantarflexion 4- 4-  Ankle inversion    Ankle eversion     (Blank rows = not tested)   FUNCTIONAL TESTS:  5x STS 15.43 sec shortness of breath, low back pain TUG with 4 WW hands assist 24.63 sec back pain < 5 2 MWT 222 feet RPE 5-6/10  O2 98%; strain feeling in back   07/03/24 TUG with 4WW hands assist  32.21 sec 5xSTS 19.17 sec no SOB reported, low back pain   GAIT: Comments: decreased gait speed, using 4 [employer]  TREATMENT DATE:  [facility] Adult PT Treatment:   08/29/24: Pt seen for aquatic therapy today.  Treatment took place in water  3.5-4.75 ft in depth at the [facility] pool. Temp of water  was 91.  Pt entered pool via forward facing steps using step to pattern and heavy uE support on hand rails/cga, exited lift due to fatigue.  -Seated in chair: LAQ; cycling; hip add/abd; ankle df/pf, circles -forward amb across pool ue support white barbell: x 4 widths 3.6 ft->3.8 ft -backward amb x 1 width ue support barbell (uncomfortable  -TrA engagement using 1/2->1/4 noodle pull down leaning against water  bench->standing -Standing ue support wall:  toe raises; heel raises; high knee marching; relaxed squats   08/25/24:Pt seen for aquatic therapy today.  Treatment took place in water  3.5-4.75 ft in depth at the [facility] pool. Temp of water  was 91.  Pt entered and exited the pool via mechanical lift today.Pt requires buoyancy of water  for support and to offload joints with strengthening exercises.    -Seated in chair: LAQ; cycling (1 min 2x); hip add/abd; ankle df/pf, circles 20x each Bil -Standing ue support wall: high knee  marching; relaxed squats, calf raises, side steps, back steps 2x10 Bil  - seated rest period -forward amb across pool ue support yellow noodle 4 lengths. -Attempted sitting in chair and pressing the small green ball under the water  for seated core strength. Pt would tip over, attempted small ball submersion and this was better but pt had to put 1 foot on the floor.  08/22/24: Pt seen for aquatic therapy today.  Treatment took place in water  3.5-4.75 ft in depth at the [facility] pool. Temp of water  was 91.  Pt entered pool via forward facing steps using step to pattern and heavy uE support on hand rails/cga, exited lift due to fatigue.  -Seated in chair: LAQ; cycling; hip add/abd; ankle df/pf, circles -Standing ue support wall: high knee marching; relaxed squats -side stepping ue support wall x 10 R/L x 2. Cues for stepping over box 2nd set.  - seated rest period -forward amb across pool ue support white barbell: x 4 widths (short rest period at each wall) -STS from water  bench onto water  step with min/cga 2 x5.  Most assistance required with gaining immediate standing balance. Improves with repetition.  Pt requires the buoyancy  and hydrostatic pressure of water  for support, and to offload joints by unweighting joint load by at least 50 % in navel deep water  and by at least 75-80% in chest to neck deep water .  Viscosity of the water  is needed for resistance of strengthening. Water  current perturbations provides challenge to standing balance requiring increased core activation.      07/28/24:t seen for aquatic therapy today.  Treatment took place in water  3.5-4.75 ft in depth at the [facility] pool. Temp of water  was 91.  Pt entered and exited the pool via mechanical lift due to recent fall. -Seated in chair; LE movement in all planes 20x2 each Bi -Standing at wall next to chair: 2x10 heel lifts, 10x2 marches, 10 side steps, 10 backwards steps: pt holding onto wall -lumbar  stretch holding onto 1 rail: 3 breaths 3x; repeat later in session -lateral step holding the wall: 3 steps each direction 3x.  -seated bicycle 1 min 3x                                                                                                          PATIENT EDUCATION:  Education details: Educated patient on anatomy and physiology of current symptoms, prognosis, plan of care as well as initial self care strategies to promote recovery Person educated: Patient Education method: Explanation Education comprehension: verbalized understanding  HOME EXERCISE PROGRAM: Access Code: [email or web address] URL: [email or web address] Date: 07/03/2024 Prepared by: Mliss  Exercises - Supine Heel Slide  - 1 x daily - 7 x weekly - 2 sets - 10 reps - Beginner Bridge  - 1 x daily - 7 x weekly - 2 sets - 10 reps - Seated Hip Abduction  - 1 x daily - 7 x weekly - 2 sets - 10 reps - Sit to Stand with Counter Support  - 2 x daily - 7 x weekly - 2 sets - 5 repsTo be started  ASSESSMENT:  CLINICAL IMPRESSION:  Pt reports some increased left LB area discomfort but relatively low 3/10 only when standing. Challenged balance today and increased core engagement with good toleration. Several rest periods given and continual assessment of toleration as session progressed. Pt pleased with session felt like she accomplished some new things. Goals ongoing    OBJECTIVE IMPAIRMENTS: decreased activity tolerance, decreased balance, decreased mobility, difficulty walking, decreased ROM, decreased strength, impaired perceived functional ability, impaired flexibility, and pain.   ACTIVITY LIMITATIONS: carrying, lifting, bending, standing, squatting, sleeping, stairs, transfers, hygiene/grooming, and locomotion level  PARTICIPATION LIMITATIONS: meal prep, cleaning, laundry, driving, shopping, and community activity  PERSONAL FACTORS: Time since onset of injury/illness/exacerbation and 3+ comorbidities: cardiac  history, leukemia, lymphedema, HTN are also affecting patient's functional outcome.   REHAB POTENTIAL: Good  CLINICAL DECISION MAKING: Evolving/moderate complexity  EVALUATION COMPLEXITY: Moderate   GOALS: Goals reviewed with patient? Yes  SHORT TERM GOALS: Target date: 06/08/2024    The patient will demonstrate knowledge of basic self care strategies and exercises to promote healing  Baseline: Goal status: MET  2.  The patient will report a 30% improvement  in pain levels with functional activities which are currently difficult including night time and in the mornings Baseline:  Goal status: IN PROGRESS 11/24  3.  The patient will have improved Timed Up and Go (TUG) time to    20 sec   sec indicating improved gait speed and LE strength  Baseline:  Goal status: IN PROGRESS 11/24  4.  The patient will be able to ambulate 350 feet in 3 minutes needed for short community walking, medical appts Baseline:  Goal status: IN PROGRESS 11/24 183 FT in 3 min  5.  Improved LE strength improved as indicated by 5x STS test to 14 sec with min UE assist Baseline:  Goal status: IN PROGRESS 11/24    LONG TERM GOALS: Target date: 09/29/24    The patient will be independent in a safe self progression of a home exercise program to promote further recovery of function  Baseline:  Goal status: INITIAL  2.  The patient will report a 30% improvement in pain levels with functional activities which are currently difficult including night time and in the mornings Baseline:  Goal status: INITIAL  3.  The patient will have improved gait stamina and speed needed to ambulate 600 feet in 6 minutes  Baseline:  Goal status: INITIAL  4.  The patient will have improved Timed Up and Go (TUG) time to   17   sec indicating improved gait speed, decreased risk of falls and LE strength  Baseline:  Goal status: INITIAL  5.  Modified Oswestry functional outcome measure score improved to   26  %  indicating improved function with ADLS with less pain.   Baseline:  Goal status: INITIAL   PLAN:  PT FREQUENCY: 2x/week  PT DURATION: 12 weeks  PLANNED INTERVENTIONS: 97164- PT Re-evaluation, 97750- Physical Performance Testing, 97110-Therapeutic exercises, 97530- Therapeutic activity, 97112- Neuromuscular re-education, 97535- Self Care, 02859- Manual therapy, [phone]- Aquatic Therapy, [phone] (1-2 muscles), 20561 (3+ muscles)- Dry Needling, Patient/Family education, Balance training, Stair training, Taping, Joint mobilization, Cryotherapy, and Moist heat.  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

## 2024-08-30 ENCOUNTER — Other Ambulatory Visit

## 2024-08-30 ENCOUNTER — Ambulatory Visit: Attending: Student in an Organized Health Care Education/Training Program

## 2024-08-30 DIAGNOSIS — I5022 Chronic systolic (congestive) heart failure: Secondary | ICD-10-CM | POA: Diagnosis not present

## 2024-08-30 DIAGNOSIS — Z95810 Presence of automatic (implantable) cardiac defibrillator: Secondary | ICD-10-CM

## 2024-08-30 LAB — CUP PACEART REMOTE DEVICE CHECK
Battery Remaining Longevity: 123 mo
Battery Voltage: 3.02 V
Brady Statistic RV Percent Paced: 0.21 %
Date Time Interrogation Session: 20260120051224
HighPow Impedance: 53 Ohm
Implantable Lead Connection Status: 753985
Implantable Lead Connection Status: 753985
Implantable Lead Connection Status: 753985
Implantable Lead Implant Date: 20170612
Implantable Lead Implant Date: 20170612
Implantable Lead Implant Date: 20170612
Implantable Lead Location: 753858
Implantable Lead Location: 753859
Implantable Lead Location: 753860
Implantable Lead Model: 4398
Implantable Lead Model: 5076
Implantable Pulse Generator Implant Date: 20250530
Lead Channel Impedance Value: 228 Ohm
Lead Channel Impedance Value: 304 Ohm
Lead Channel Impedance Value: 323 Ohm
Lead Channel Impedance Value: 323 Ohm
Lead Channel Impedance Value: 399 Ohm
Lead Channel Impedance Value: 437 Ohm
Lead Channel Impedance Value: 456 Ohm
Lead Channel Impedance Value: 608 Ohm
Lead Channel Impedance Value: 608 Ohm
Lead Channel Impedance Value: 703 Ohm
Lead Channel Impedance Value: 703 Ohm
Lead Channel Impedance Value: 703 Ohm
Lead Channel Impedance Value: 741 Ohm
Lead Channel Pacing Threshold Amplitude: 0.625 V
Lead Channel Pacing Threshold Amplitude: 1 V
Lead Channel Pacing Threshold Amplitude: 1.75 V
Lead Channel Pacing Threshold Pulse Width: 0.4 ms
Lead Channel Pacing Threshold Pulse Width: 0.4 ms
Lead Channel Pacing Threshold Pulse Width: 0.8 ms
Lead Channel Sensing Intrinsic Amplitude: 1 mV
Lead Channel Sensing Intrinsic Amplitude: 6 mV
Lead Channel Setting Pacing Amplitude: 1.5 V
Lead Channel Setting Pacing Amplitude: 1.5 V
Lead Channel Setting Pacing Amplitude: 2.75 V
Lead Channel Setting Pacing Pulse Width: 0.4 ms
Lead Channel Setting Pacing Pulse Width: 0.8 ms
Lead Channel Setting Sensing Sensitivity: 0.3 mV
Zone Setting Status: 755011

## 2024-08-31 ENCOUNTER — Ambulatory Visit: Payer: Self-pay | Admitting: Cardiology

## 2024-08-31 NOTE — Progress Notes (Signed)
 EPIC Encounter for ICM Monitoring  Patient Name: Lisa [name] is a 84 y.o. female Date: 08/31/2024 Primary Care Physican: Claudene Pellet, MD Primary Cardiologist: Okey Electrophysiologist: Almetta Pore Pacing: 97.9%         11/12/2021 Weight: 248 lbs 01/21/2022 Weight: 246 lbs 09/01/2022 Office Weight: 205 lbs 04/23/2023 Office Weight: 206 lbs 09/02/2023 Weight: 170 lbs 09/14/2023 Weight: 172 lbs 03/02/2024 Weight: 141.8 lbs  04/06/2024 Weight: 131.4 lbs       06/09/2024 Office Weight: 119 lbs      07/24/2024 Weight: 119-120 lbs                                       Spoke with patient and heart failure questions reviewed.  Transmission results reviewed.  Pt reports wrapping legs to help with edema.   She occasionally misses Torsemide .    Diet:  Reviews labels for salt contents and buys low salt foods.     Since 08/07/2024 ICM Remote Transmission: Optivol thoracic impedance suggesting intermittent days with possible fluid accumulation.    Prescribed:  Torsemide  20 mg take 1 tablet(s) (20 mg total) by mouth daily.   11/16/2023 She reports Dr Waddell advised, at 11/01/2023 OV, she can take an extra Torsemide  about 1-2 times a week if needed.     Labs: 08/22/2024 Creatinine 1.35, BUN 27, Potassium 4.3, Sodium 137, GFR 39 06/15/2024 Creatinine 1.57, BUN 36, Potassium 4.3, Sodium 134, GFR 33 06/05/2024 Creatinine 1.45, BUN 33, Potassium 4.5, Sodium 135, GFR 36 03/02/2024 Creatinine 1.84, BUN 26, Potassium 4.2, Sodium 133, GFR 27  01/08/2024 Creatinine 1.47, BUN 18, Potassium 4.4, Sodium 134, GFR 36  01/07/2024 Creatinine 1.43, BUN 19, Potassium 4.4, Sodium 134, GFR 37  01/06/2024 Creatinine 1.54, BUN 21, Potassium 3.3, Sodium 137, GFR 34 01/05/2024 Creatinine 1.54, BUN 23, Potassium 3.8, Sodium 134, GFR 34  A complete set of results can be found in Results Review.   Recommendations:  No changes and encouraged to call if experiencing any fluid symptoms..   Follow-up plan: ICM clinic phone  appointment on 10/02/2024.   91 day device clinic remote transmission 11/28/2024.     EP/Cardiology Office Visits:  Recall 09/19/2024 with Dr Almetta.    Recall 10/06/2024 with Dr Okey.   Copy of ICM check sent to Dr. Almetta.    Remote monitoring is medically necessary for Heart Failure Management.    Daily Thoracic Impedance ICM trend: 05/30/2024 through 08/29/2024.    12-14 Month Thoracic Impedance ICM trend:     Mitzie GORMAN Garner, RN 08/31/2024 3:13 PM

## 2024-09-01 ENCOUNTER — Ambulatory Visit: Admitting: Physical Therapy

## 2024-09-01 ENCOUNTER — Encounter: Payer: Self-pay | Admitting: Physical Therapy

## 2024-09-01 DIAGNOSIS — M5459 Other low back pain: Secondary | ICD-10-CM | POA: Diagnosis not present

## 2024-09-01 DIAGNOSIS — M6281 Muscle weakness (generalized): Secondary | ICD-10-CM

## 2024-09-01 DIAGNOSIS — R262 Difficulty in walking, not elsewhere classified: Secondary | ICD-10-CM

## 2024-09-01 NOTE — Therapy (Signed)
 " OUTPATIENT PHYSICAL THERAPY THORACOLUMBAR TREATMENT NOTE     Patient Name: Lisa [name] MRN: [id number] DOB:Feb 07, 1941, 84 y.o., female Today's Date: 09/01/2024  END OF SESSION:  PT End of Session - 09/01/24 1207     Visit Number 13    Date for Recertification  09/29/24    Authorization Type Medicare    Progress Note Due on Visit 18    PT Start Time 1207    PT Stop Time 1250    PT Time Calculation (min) 43 min    Activity Tolerance Patient tolerated treatment well    Behavior During Therapy [facility] for tasks assessed/performed                     Past Medical History:  Diagnosis Date   Acute on chronic systolic heart failure (HCC)    Acute renal failure superimposed on stage 3b chronic kidney disease (HCC) 05/22/2022   Bacteremia due to Pseudomonas 06/16/2022   CAD in native artery 2017   stent to LAD   Cardiac arrest (HCC) 09/2015   CHF (congestive heart failure) (HCC)    CML (chronic myelocytic leukemia) (HCC)    Complication of anesthesia    Hx: UTI (urinary tract infection)    Hypertension    Melanoma (HCC)    PONV (postoperative nausea and vomiting)    PVC (premature ventricular contraction)    HISTORY OF   Thyroid  disease    V tach (HCC)    Past Surgical History:  Procedure Laterality Date   ABDOMINAL HYSTERECTOMY     APPENDECTOMY     CARDIAC CATHETERIZATION N/A 10/03/2015   Procedure: Left Heart Cath and Coronary Angiography;  Surgeon: Peter M Jordan, MD;  Location: [facility] INVASIVE CV LAB;  Service: Cardiovascular;  Laterality: N/A;   CARDIAC CATHETERIZATION N/A 10/07/2015   Procedure: Coronary Stent Intervention;  Surgeon: Lonni JONETTA Cash, MD;  Location: [facility] INVASIVE CV LAB;  Service: Cardiovascular;  Laterality: N/A;   CARDIAC CATHETERIZATION  09/22/2019   EP IMPLANTABLE DEVICE N/A 01/20/2016   Procedure: BiV ICD Insertion CRT-D;  Surgeon: Danelle LELON Birmingham, MD;  Location: [facility] INVASIVE CV LAB;  Service: Cardiovascular;  Laterality: N/A;   ICD  GENERATOR CHANGEOUT N/A 01/07/2024   Procedure: ICD GENERATOR CHANGEOUT;  Surgeon: Birmingham Danelle LELON, MD;  Location: [facility] INVASIVE CV LAB;  Service: Cardiovascular;  Laterality: N/A;   IR THORACENTESIS RIGHT ASP PLEURAL SPACE W/IMG GUIDE  07/20/2023   IR THORACENTESIS RIGHT ASP PLEURAL SPACE W/IMG GUIDE  12/02/2023   LEFT HEART CATH AND CORONARY ANGIOGRAPHY N/A 09/22/2019   Procedure: LEFT HEART CATH AND CORONARY ANGIOGRAPHY;  Surgeon: Jordan, Peter M, MD;  Location: [facility] INVASIVE CV LAB;  Service: Cardiovascular;  Laterality: N/A;   TONSILLECTOMY     TONSILLECTOMY     TUBAL LIGATION     Patient Active Problem List   Diagnosis Date Noted   ICD (implantable cardioverter-defibrillator) discharge 12/29/2023   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   Need for management of chest tube 12/06/2023   Hydropneumothorax 12/04/2023   Obesity, class 1 08/20/2023   Fall at home, initial encounter 08/09/2023   Generalized weakness 08/09/2023   Elevated troponin 08/09/2023   Anemia of chronic disease 08/09/2023   Acute on chronic diastolic heart failure (HCC) 08/08/2023   Cellulitis, leg 07/17/2023   Open wound of right lower extremity 07/17/2023   Respiratory distress 07/17/2023   Recurrent left pleural effusion 07/17/2023   Heart failure with reduced ejection fraction (HCC) 07/17/2023   Chronic kidney  disease, stage III (moderate) (HCC) 07/17/2023   Obesity (BMI 30-39.9) 07/17/2023   Urine culture positive 06/19/2022   Transaminitis 06/17/2022   Viral gastroenteritis 06/16/2022   Venous stasis dermatitis of both lower extremities 05/26/2022   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 05/22/2022   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/21/2022   Paroxysmal ventricular tachycardia (HCC) 09/21/2019   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   Unstable angina (HCC) 10/07/2015   Chronic systolic heart failure (HCC)    PVC's (premature ventricular contractions)    Ventricular fibrillation (HCC)  10/03/2015   History of cardiac arrest 10/03/2015   Essential hypertension 09/09/2015   Lymphedema 02/21/2015   Acquired hypothyroidism 02/21/2015    PCP: Claudene Pellet MD  REFERRING PROVIDER: Claudene Pellet MD  REFERRING DIAG: M54.50 LBP  Rationale for Evaluation and Treatment: Rehabilitation  THERAPY DIAG:  Other low back pain  Difficulty in walking, not elsewhere classified  Muscle weakness (generalized)  ONSET DATE: approx 1 year  SUBJECTIVE:                                                                                                                                                                                           SUBJECTIVE STATEMENT: I think I am going to have shots in my back but I don't know when.   PERTINENT HISTORY:  Leukemia oral chemo Pacemaker/debrillator CHF HTN Hospitalized in May for 1 week, fluid  2 falls in July: turning off light and tipped over on side;  the other fall was after got out of shower and turned and sat straight down (had to get son and caregiver helped get off the floor) caregiver 2x/week, alone 4 days/week HHPT 4-5 weeks walking with 4 WW, bands (red) for legs and arms  Blood clot in leg Lymphedema   PAIN:   09/01/2024  Are you having pain? Yes NPRS scale: 4-5/10 with sitting; night time really bad  Pain location: bil lumbar and left hip Pain orientation: Bilateral  PAIN TYPE: aching Pain description: intermittent  Aggravating factors: night, early AM, lying down  Relieving factors: sleep with heating pad; Tylenol , Blue Emu, Tiger Balm  PRECAUTIONS: ICD/Pacemaker    WEIGHT BEARING RESTRICTIONS: fall  FALLS:  Has patient fallen in last 6 months? Yes. Number of falls 2  LIVING ENVIRONMENT: Lives with: lives alone Lives in: House/apartment Stairs: No, 3 steps to get in the door (getting better) Has following equipment at home: Vannie - 4 wheeled, shower chair  OCCUPATION: psychologist, counselling, real estate,  currently volunteer for [employer]  PLOF: Independent with basic ADLs  PATIENT GOALS: want to have hydrotherapy; go as far as  I can in mobility; walk without help but I'm too off balance; be more flexible; get in car easier without hands assisting legs; energy to walk around grocery store  NEXT MD VISIT: as needed  OBJECTIVE:  Note: Objective measures were completed at Evaluation unless otherwise noted.  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  2. Personal care (washing, dressing, etc.) 0 =  I can take care of myself normally without causing increased pain.  3. Lifting 3 = Pain prevents me from lifting heavy weights, but I can manage (5) I have hardly any social life because of my pain. light to medium weights if they are conveniently positioned  4. Walking 3 =  Pain prevents me from walking more than  mile.  5. Sitting 1 =  I can only sit in my favorite chair as long as I like.  6. Standing 3 =  Pain prevents me from standing more than 1/2 hour.  7. Sleeping 1 = I can sleep well only by using pain medication.  8. Social Life 1 =  My social life is normal, but it increases my level of pain.  9. Traveling 1 =  I can travel anywhere, but it increases my pain.  10. Employment/ Homemaking 2 = I can perform most of my homemaking/job duties, but pain prevents me from performing more physically stressful activities (eg, lifting, vacuuming).  Total 34%   Interpretation of scores: Score Category Description  0-20% Minimal Disability The patient can cope with most living activities. Usually no treatment is indicated apart from advice on lifting, sitting and exercise  21-40% Moderate Disability The patient experiences more pain and difficulty with  sitting, lifting and standing. Travel and social life are more difficult and they may be disabled from work. Personal care, sexual activity and sleeping are not grossly affected, and the patient can usually be managed by conservative means  41-60% Severe Disability Pain remains the main problem in this group, but activities of daily living are affected. These patients require a detailed investigation  61-80% Crippled Back pain impinges on all aspects of the patients life. Positive intervention is required  81-100% Bed-bound  These patients are either bed-bound or exaggerating their symptoms  Bluford FORBES Zoe DELENA Karon DELENA, et al. Surgery versus conservative management of stable thoracolumbar fracture: the PRESTO feasibility RCT. [place] ([place]): [employer]; 2021 Nov. [facility] Technology Assessment, No. 25.62.) Appendix 3, Oswestry Disability Index category descriptors. Available from: Findjewelers.cz  Minimally Clinically Important Difference (MCID) = 12.8%  Modified Oswestry: 07/03/24  22 / 50 = 44.0 %  COGNITION: Overall cognitive status: Within functional limits for tasks assessed     MUSCLE LENGTH: Decreased HS and hip flexor lengths bil POSTURE: tends to stand forward flexed slightly, reports decreased back pain with standing erect  LUMBAR ROM:   AROM eval  Flexion 50  Extension 10  Right lateral flexion   Left lateral flexion   Right rotation   Left rotation    (Blank rows = not tested)  TRUNK STRENGTH:  Decreased activation of transverse abdominus muscles; abdominals 4-/5; decreased activation of lumbar multifidi; trunk extensors 4-/5   LOWER EXTREMITY ROM:   lacks full knee extension bil;  hip flexion 70 degrees, hip extension 5 degrees,  unable to achieve seated figure 4 (external rotation)  LOWER EXTREMITY MMT:  unable to rise from a standard height chair without significant UE assist; unable to SLS without heavy UE assist  MMT  Right eval Left eval  Hip flexion 4- 4-  Hip extension 4- 4-  Hip abduction 3+ 3+  Hip adduction    Hip internal rotation    Hip external rotation    Knee flexion 4- 4-  Knee extension 3+ 3+  Ankle dorsiflexion 4- 4-  Ankle plantarflexion 4- 4-  Ankle inversion    Ankle eversion     (Blank rows = not tested)   FUNCTIONAL TESTS:  5x STS 15.43 sec shortness of breath, low back pain TUG with 4 WW hands assist 24.63 sec back pain < 5 2 MWT 222 feet RPE 5-6/10  O2 98%; strain feeling in back   07/03/24 TUG with 4WW hands assist  32.21 sec 5xSTS 19.17 sec no SOB reported, low back pain   GAIT: Comments: decreased gait speed, using 4 [employer]  TREATMENT DATE:  [facility] Adult PT Treatment:   09/01/24:Pt seen for aquatic therapy today.  Treatment took place in water  3.5-4.75 ft in depth at the [facility] pool. Temp of water  was 91.  Pt entered and exited the pool via mechanical lift today.Pt requires buoyancy of water  for support and to offload joints with strengthening exercises.    -Seated in chair: LAQ; cycling (1 min 2x); hip add/abd; ankle df/pf, circles 20x2 split into 2 different times: each Bil -Standing ue support wall: high knee marching; relaxed squats, calf raises, side steps, back steps 2x15 Bil  - seated rest period -Side step 2x then back; repeat 5x -forward amb across pool ue support yellow noodle 4 lengths. Rest and repeat her seated LE exercises, then return to forward walk with Qtiip 4 lengths. -Seated bicycle 3 min x2.  08/29/24: Pt seen for aquatic therapy today.  Treatment took place in water  3.5-4.75 ft in depth at the [facility] pool. Temp of water  was 91.  Pt entered pool via forward facing steps using step to pattern and heavy uE support on hand rails/cga, exited lift due to fatigue.  -Seated in chair: LAQ; cycling; hip add/abd; ankle df/pf, circles -forward amb across pool ue support white barbell: x 4 widths 3.6 ft->3.8 ft -backward amb x 1  width ue support barbell (uncomfortable  -TrA engagement using 1/2->1/4 noodle pull down leaning against water  bench->standing -Standing ue support wall:  toe raises; heel raises; high knee marching; relaxed squats   08/25/24:Pt seen for aquatic therapy today.  Treatment took place in water  3.5-4.75 ft in depth at the [facility] pool. Temp of water  was 91.  Pt entered and exited the pool via mechanical lift today.Pt requires buoyancy of water  for support and to offload joints with strengthening exercises.    -Seated in chair: LAQ; cycling (1 min 2x); hip add/abd; ankle df/pf, circles 20x each Bil -Standing ue support wall: high knee marching; relaxed squats, calf raises, side steps, back steps 2x10 Bil  - seated rest period -forward amb across pool ue support yellow noodle 4 lengths. -Attempted sitting in chair and pressing the small green ball under the water  for seated core strength. Pt would tip over, attempted small ball submersion and this was better but pt had to put 1 foot on the floor.  PATIENT EDUCATION:  Education details: Educated patient on anatomy and physiology of current symptoms, prognosis, plan of care as well as initial self care strategies to promote recovery Person educated: Patient Education method: Explanation Education comprehension: verbalized understanding  HOME EXERCISE PROGRAM: Access Code: [email or web address] URL: [email or web address] Date: 07/03/2024 Prepared by: Mliss  Exercises - Supine Heel Slide  - 1 x daily - 7 x weekly - 2 sets - 10 reps - Beginner Bridge  - 1 x daily - 7 x weekly - 2 sets - 10 reps - Seated Hip Abduction  - 1 x daily - 7 x weekly - 2 sets - 10 reps - Sit to Stand with Counter Support  - 2 x daily - 7 x weekly - 2 sets - 5 repsTo be started  ASSESSMENT:  CLINICAL IMPRESSION: Left low back bothersome on land but improved  while exercising in the water . Slight increase in reps for LE strength and increased walking frequency for improved walking endurance. Pt reports no pain while exercising in the water  just feeling tight.    OBJECTIVE IMPAIRMENTS: decreased activity tolerance, decreased balance, decreased mobility, difficulty walking, decreased ROM, decreased strength, impaired perceived functional ability, impaired flexibility, and pain.   ACTIVITY LIMITATIONS: carrying, lifting, bending, standing, squatting, sleeping, stairs, transfers, hygiene/grooming, and locomotion level  PARTICIPATION LIMITATIONS: meal prep, cleaning, laundry, driving, shopping, and community activity  PERSONAL FACTORS: Time since onset of injury/illness/exacerbation and 3+ comorbidities: cardiac history, leukemia, lymphedema, HTN are also affecting patient's functional outcome.   REHAB POTENTIAL: Good  CLINICAL DECISION MAKING: Evolving/moderate complexity  EVALUATION COMPLEXITY: Moderate   GOALS: Goals reviewed with patient? Yes  SHORT TERM GOALS: Target date: 06/08/2024    The patient will demonstrate knowledge of basic self care strategies and exercises to promote healing  Baseline: Goal status: MET  2.  The patient will report a 30% improvement in pain levels with functional activities which are currently difficult including night time and in the mornings Baseline:  Goal status: IN PROGRESS 11/24  3.  The patient will have improved Timed Up and Go (TUG) time to    20 sec   sec indicating improved gait speed and LE strength  Baseline:  Goal status: IN PROGRESS 11/24  4.  The patient will be able to ambulate 350 feet in 3 minutes needed for short community walking, medical appts Baseline:  Goal status: IN PROGRESS 11/24 183 FT in 3 min  5.  Improved LE strength improved as indicated by 5x STS test to 14 sec with min UE assist Baseline:  Goal status: IN PROGRESS 11/24    LONG TERM GOALS: Target date:  09/29/24    The patient will be independent in a safe self progression of a home exercise program to promote further recovery of function  Baseline:  Goal status: INITIAL  2.  The patient will report a 30% improvement in pain levels with functional activities which are currently difficult including night time and in the mornings Baseline:  Goal status: INITIAL  3.  The patient will have improved gait stamina and speed needed to ambulate 600 feet in 6 minutes  Baseline:  Goal status: INITIAL  4.  The patient will have improved Timed Up and Go (TUG) time to   17   sec indicating improved gait speed, decreased risk of falls and LE strength  Baseline:  Goal status: INITIAL  5.  Modified Oswestry functional outcome measure score improved to   26  % indicating improved function with ADLS with  less pain.   Baseline:  Goal status: INITIAL   PLAN:  PT FREQUENCY: 2x/week  PT DURATION: 12 weeks  PLANNED INTERVENTIONS: 97164- PT Re-evaluation, 97750- Physical Performance Testing, 97110-Therapeutic exercises, 97530- Therapeutic activity, 97112- Neuromuscular re-education, 97535- Self Care, [id number]- Manual therapy, [phone]- Aquatic Therapy, [phone] (1-2 muscles), 20561 (3+ muscles)- Dry Needling, Patient/Family education, Balance training, Stair training, Taping, Joint mobilization, Cryotherapy, and Moist heat.  PLAN FOR NEXT SESSION: continue strengthening hip flexors especially for step ups, and general hip strengthening No ES (pacemaker/defibrillator)  Delon Darner, PTA 09/01/24 12:54 PM   [facility] [address] [place], [place], [address] Phone: [phone]   Fax:  [phone]      "

## 2024-09-01 NOTE — Progress Notes (Signed)
 Remote ICD Transmission

## 2024-09-05 ENCOUNTER — Ambulatory Visit: Admitting: Physical Medicine and Rehabilitation

## 2024-09-06 ENCOUNTER — Ambulatory Visit: Admitting: Physical Therapy

## 2024-09-07 NOTE — Progress Notes (Signed)
 31 day ICM Remote transmission canceled due to [facility] clinic is on hold until further notice.  91 day remote monitoring will continue per protocol.

## 2024-09-08 ENCOUNTER — Ambulatory Visit: Admitting: Physical Therapy

## 2024-09-11 ENCOUNTER — Ambulatory Visit: Admitting: Physical Medicine and Rehabilitation

## 2024-09-13 ENCOUNTER — Ambulatory Visit: Admitting: Physical Therapy

## 2024-09-13 ENCOUNTER — Ambulatory Visit (HOSPITAL_BASED_OUTPATIENT_CLINIC_OR_DEPARTMENT_OTHER): Admitting: Physical Therapy

## 2024-09-14 ENCOUNTER — Ambulatory Visit: Admitting: Orthopaedic Surgery

## 2024-09-15 ENCOUNTER — Ambulatory Visit: Admitting: Physical Therapy

## 2024-09-18 ENCOUNTER — Encounter (HOSPITAL_BASED_OUTPATIENT_CLINIC_OR_DEPARTMENT_OTHER): Attending: Family Medicine | Admitting: Physical Therapy

## 2024-09-20 ENCOUNTER — Ambulatory Visit: Attending: Family Medicine | Admitting: Physical Therapy

## 2024-09-25 ENCOUNTER — Ambulatory Visit: Admitting: Physical Therapy

## 2024-10-02 ENCOUNTER — Ambulatory Visit

## 2024-10-09 ENCOUNTER — Encounter

## 2024-10-09 ENCOUNTER — Ambulatory Visit: Admitting: Orthopaedic Surgery

## 2024-11-28 ENCOUNTER — Encounter

## 2025-01-08 ENCOUNTER — Encounter

## 2025-02-27 ENCOUNTER — Encounter

## 2025-04-09 ENCOUNTER — Encounter

## 2025-05-29 ENCOUNTER — Encounter

## 2025-07-09 ENCOUNTER — Encounter

## 2025-08-28 ENCOUNTER — Encounter

## 2025-10-08 ENCOUNTER — Encounter

## 2025-11-27 ENCOUNTER — Encounter

## 2026-01-08 ENCOUNTER — Encounter

## 2026-02-26 ENCOUNTER — Encounter

## 2026-04-09 ENCOUNTER — Encounter
